# Patient Record
Sex: Female | Born: 1950 | Race: White | Hispanic: No | Smoking: Never smoker
Health system: Southern US, Community
[De-identification: ages and names within clinical notes are randomized; demographics above are authoritative.]

## PROBLEM LIST (undated history)

## (undated) DIAGNOSIS — G43909 Migraine, unspecified, not intractable, without status migrainosus: Secondary | ICD-10-CM

## (undated) DIAGNOSIS — M797 Fibromyalgia: Secondary | ICD-10-CM

## (undated) DIAGNOSIS — T7840XA Allergy, unspecified, initial encounter: Secondary | ICD-10-CM

## (undated) DIAGNOSIS — E119 Type 2 diabetes mellitus without complications: Secondary | ICD-10-CM

## (undated) DIAGNOSIS — M1990 Unspecified osteoarthritis, unspecified site: Secondary | ICD-10-CM

## (undated) DIAGNOSIS — I10 Essential (primary) hypertension: Secondary | ICD-10-CM

## (undated) DIAGNOSIS — R112 Nausea with vomiting, unspecified: Secondary | ICD-10-CM

## (undated) DIAGNOSIS — R011 Cardiac murmur, unspecified: Secondary | ICD-10-CM

## (undated) DIAGNOSIS — F32A Depression, unspecified: Secondary | ICD-10-CM

## (undated) DIAGNOSIS — E1149 Type 2 diabetes mellitus with other diabetic neurological complication: Secondary | ICD-10-CM

## (undated) DIAGNOSIS — Z9981 Dependence on supplemental oxygen: Secondary | ICD-10-CM

## (undated) DIAGNOSIS — D649 Anemia, unspecified: Secondary | ICD-10-CM

## (undated) DIAGNOSIS — E039 Hypothyroidism, unspecified: Secondary | ICD-10-CM

## (undated) DIAGNOSIS — Z8679 Personal history of other diseases of the circulatory system: Secondary | ICD-10-CM

## (undated) DIAGNOSIS — I350 Nonrheumatic aortic (valve) stenosis: Secondary | ICD-10-CM

## (undated) DIAGNOSIS — I493 Ventricular premature depolarization: Secondary | ICD-10-CM

## (undated) DIAGNOSIS — R61 Generalized hyperhidrosis: Secondary | ICD-10-CM

## (undated) DIAGNOSIS — Z9889 Other specified postprocedural states: Secondary | ICD-10-CM

## (undated) DIAGNOSIS — E538 Deficiency of other specified B group vitamins: Secondary | ICD-10-CM

## (undated) DIAGNOSIS — E785 Hyperlipidemia, unspecified: Secondary | ICD-10-CM

## (undated) DIAGNOSIS — T8859XA Other complications of anesthesia, initial encounter: Secondary | ICD-10-CM

## (undated) DIAGNOSIS — K589 Irritable bowel syndrome without diarrhea: Secondary | ICD-10-CM

## (undated) DIAGNOSIS — K219 Gastro-esophageal reflux disease without esophagitis: Secondary | ICD-10-CM

## (undated) DIAGNOSIS — F329 Major depressive disorder, single episode, unspecified: Secondary | ICD-10-CM

## (undated) DIAGNOSIS — R0902 Hypoxemia: Secondary | ICD-10-CM

## (undated) DIAGNOSIS — T4145XA Adverse effect of unspecified anesthetic, initial encounter: Secondary | ICD-10-CM

## (undated) HISTORY — DX: Depression, unspecified: F32.A

## (undated) HISTORY — DX: Fibromyalgia: M79.7

## (undated) HISTORY — DX: Hypoxemia: R09.02

## (undated) HISTORY — PX: ADENOIDECTOMY: SUR15

## (undated) HISTORY — PX: BACK SURGERY: SHX140

## (undated) HISTORY — DX: Gastro-esophageal reflux disease without esophagitis: K21.9

## (undated) HISTORY — DX: Type 2 diabetes mellitus with other diabetic neurological complication: E11.49

## (undated) HISTORY — DX: Deficiency of other specified B group vitamins: E53.8

## (undated) HISTORY — DX: Migraine, unspecified, not intractable, without status migrainosus: G43.909

## (undated) HISTORY — DX: Personal history of other diseases of the circulatory system: Z86.79

## (undated) HISTORY — DX: Hyperlipidemia, unspecified: E78.5

## (undated) HISTORY — DX: Type 2 diabetes mellitus without complications: E11.9

## (undated) HISTORY — DX: Irritable bowel syndrome, unspecified: K58.9

## (undated) HISTORY — DX: Allergy, unspecified, initial encounter: T78.40XA

## (undated) HISTORY — DX: Major depressive disorder, single episode, unspecified: F32.9

## (undated) HISTORY — PX: TONSILLECTOMY AND ADENOIDECTOMY: SUR1326

## (undated) HISTORY — DX: Anemia, unspecified: D64.9

## (undated) HISTORY — DX: Hypothyroidism, unspecified: E03.9

## (undated) HISTORY — DX: Generalized hyperhidrosis: R61

## (undated) HISTORY — PX: ANTERIOR CERVICAL DECOMP/DISCECTOMY FUSION: SHX1161

## (undated) HISTORY — DX: Nonrheumatic aortic (valve) stenosis: I35.0

## (undated) HISTORY — DX: Essential (primary) hypertension: I10

---

## 1977-06-27 HISTORY — PX: TUBAL LIGATION: SHX77

## 1986-06-27 HISTORY — PX: ABDOMINAL HYSTERECTOMY: SHX81

## 1986-06-27 HISTORY — PX: APPENDECTOMY: SHX54

## 1997-10-28 ENCOUNTER — Ambulatory Visit (HOSPITAL_COMMUNITY): Admission: RE | Admit: 1997-10-28 | Discharge: 1997-10-28 | Payer: Self-pay | Admitting: Neurosurgery

## 1999-07-23 ENCOUNTER — Encounter: Payer: Self-pay | Admitting: Family Medicine

## 1999-07-23 ENCOUNTER — Encounter: Admission: RE | Admit: 1999-07-23 | Discharge: 1999-07-23 | Payer: Self-pay | Admitting: Family Medicine

## 1999-08-02 ENCOUNTER — Encounter: Admission: RE | Admit: 1999-08-02 | Discharge: 1999-08-02 | Payer: Self-pay | Admitting: Family Medicine

## 1999-08-02 ENCOUNTER — Encounter: Payer: Self-pay | Admitting: Family Medicine

## 2000-02-18 ENCOUNTER — Encounter: Admission: RE | Admit: 2000-02-18 | Discharge: 2000-02-18 | Payer: Self-pay | Admitting: Family Medicine

## 2000-02-18 ENCOUNTER — Encounter: Payer: Self-pay | Admitting: Family Medicine

## 2001-05-02 ENCOUNTER — Encounter: Payer: Self-pay | Admitting: *Deleted

## 2001-05-02 ENCOUNTER — Emergency Department (HOSPITAL_COMMUNITY): Admission: EM | Admit: 2001-05-02 | Discharge: 2001-05-02 | Payer: Self-pay | Admitting: *Deleted

## 2001-05-21 ENCOUNTER — Encounter: Admission: RE | Admit: 2001-05-21 | Discharge: 2001-05-21 | Payer: Self-pay | Admitting: Gastroenterology

## 2001-05-21 ENCOUNTER — Encounter: Payer: Self-pay | Admitting: Gastroenterology

## 2001-06-26 ENCOUNTER — Encounter (INDEPENDENT_AMBULATORY_CARE_PROVIDER_SITE_OTHER): Payer: Self-pay | Admitting: Specialist

## 2001-06-26 ENCOUNTER — Observation Stay (HOSPITAL_COMMUNITY): Admission: RE | Admit: 2001-06-26 | Discharge: 2001-06-27 | Payer: Self-pay | Admitting: *Deleted

## 2001-11-07 ENCOUNTER — Encounter: Admission: RE | Admit: 2001-11-07 | Discharge: 2001-11-07 | Payer: Self-pay | Admitting: Internal Medicine

## 2001-11-07 ENCOUNTER — Encounter: Payer: Self-pay | Admitting: Internal Medicine

## 2002-10-08 ENCOUNTER — Encounter: Payer: Self-pay | Admitting: Internal Medicine

## 2002-10-08 ENCOUNTER — Encounter: Admission: RE | Admit: 2002-10-08 | Discharge: 2002-10-08 | Payer: Self-pay | Admitting: Internal Medicine

## 2003-06-28 HISTORY — PX: CHOLECYSTECTOMY: SHX55

## 2003-10-22 ENCOUNTER — Encounter: Admission: RE | Admit: 2003-10-22 | Discharge: 2003-10-22 | Payer: Self-pay | Admitting: Internal Medicine

## 2004-06-27 HISTORY — PX: NASAL SINUS SURGERY: SHX719

## 2005-01-25 ENCOUNTER — Encounter: Admission: RE | Admit: 2005-01-25 | Discharge: 2005-01-25 | Payer: Self-pay | Admitting: Internal Medicine

## 2005-02-27 ENCOUNTER — Emergency Department (HOSPITAL_COMMUNITY): Admission: EM | Admit: 2005-02-27 | Discharge: 2005-02-28 | Payer: Self-pay | Admitting: Emergency Medicine

## 2005-03-08 ENCOUNTER — Encounter: Admission: RE | Admit: 2005-03-08 | Discharge: 2005-03-08 | Payer: Self-pay | Admitting: Internal Medicine

## 2005-12-13 ENCOUNTER — Emergency Department (HOSPITAL_COMMUNITY): Admission: EM | Admit: 2005-12-13 | Discharge: 2005-12-13 | Payer: Self-pay | Admitting: Emergency Medicine

## 2005-12-22 ENCOUNTER — Encounter: Admission: RE | Admit: 2005-12-22 | Discharge: 2005-12-22 | Payer: Self-pay | Admitting: Neurosurgery

## 2006-01-31 ENCOUNTER — Encounter: Admission: RE | Admit: 2006-01-31 | Discharge: 2006-01-31 | Payer: Self-pay | Admitting: Neurosurgery

## 2006-02-22 ENCOUNTER — Encounter: Admission: RE | Admit: 2006-02-22 | Discharge: 2006-02-22 | Payer: Self-pay | Admitting: Neurosurgery

## 2006-03-27 ENCOUNTER — Encounter: Admission: RE | Admit: 2006-03-27 | Discharge: 2006-03-27 | Payer: Self-pay | Admitting: Neurosurgery

## 2006-09-08 ENCOUNTER — Ambulatory Visit: Payer: Self-pay | Admitting: Internal Medicine

## 2006-09-08 LAB — CONVERTED CEMR LAB
BUN: 13 mg/dL (ref 6–23)
Creatinine, Ser: 0.78 mg/dL (ref 0.40–1.20)
Potassium: 3.3 meq/L — ABNORMAL LOW (ref 3.5–5.3)
TSH: 3.027 microintl units/mL (ref 0.350–5.50)

## 2006-10-02 ENCOUNTER — Ambulatory Visit: Payer: Self-pay | Admitting: Internal Medicine

## 2006-10-02 LAB — CONVERTED CEMR LAB
ALT: 34 units/L (ref 0–40)
AST: 32 units/L (ref 0–37)
Albumin: 4.1 g/dL (ref 3.5–5.2)
Alkaline Phosphatase: 91 units/L (ref 39–117)
Amylase: 98 units/L (ref 27–131)
BUN: 9 mg/dL (ref 6–23)
Basophils Absolute: 0.3 10*3/uL — ABNORMAL HIGH (ref 0.0–0.1)
Basophils Relative: 3.6 % — ABNORMAL HIGH (ref 0.0–1.0)
Bilirubin, Direct: 0.1 mg/dL (ref 0.0–0.3)
CO2: 28 meq/L (ref 19–32)
Calcium: 9.6 mg/dL (ref 8.4–10.5)
Chloride: 106 meq/L (ref 96–112)
Creatinine, Ser: 0.8 mg/dL (ref 0.4–1.2)
Eosinophils Absolute: 0 10*3/uL (ref 0.0–0.6)
Eosinophils Relative: 0.2 % (ref 0.0–5.0)
GFR calc Af Amer: 96 mL/min
GFR calc non Af Amer: 79 mL/min
Glucose, Bld: 99 mg/dL (ref 70–99)
HCT: 39.3 % (ref 36.0–46.0)
Hemoglobin: 13.7 g/dL (ref 12.0–15.0)
Lipase: 23 units/L (ref 11.0–59.0)
Lymphocytes Relative: 15.7 % (ref 12.0–46.0)
MCHC: 34.8 g/dL (ref 30.0–36.0)
MCV: 91.3 fL (ref 78.0–100.0)
Monocytes Absolute: 0.1 10*3/uL — ABNORMAL LOW (ref 0.2–0.7)
Monocytes Relative: 1.7 % — ABNORMAL LOW (ref 3.0–11.0)
Neutro Abs: 6.3 10*3/uL (ref 1.4–7.7)
Neutrophils Relative %: 78.8 % — ABNORMAL HIGH (ref 43.0–77.0)
Platelets: 398 10*3/uL (ref 150–400)
Potassium: 4.2 meq/L (ref 3.5–5.1)
RBC: 4.3 M/uL (ref 3.87–5.11)
RDW: 12.7 % (ref 11.5–14.6)
Sodium: 143 meq/L (ref 135–145)
Total Bilirubin: 0.7 mg/dL (ref 0.3–1.2)
Total Protein: 7.2 g/dL (ref 6.0–8.3)
WBC: 7.9 10*3/uL (ref 4.5–10.5)

## 2006-10-03 ENCOUNTER — Encounter: Payer: Self-pay | Admitting: Internal Medicine

## 2006-10-17 ENCOUNTER — Ambulatory Visit: Payer: Self-pay | Admitting: Gastroenterology

## 2006-10-20 ENCOUNTER — Ambulatory Visit: Payer: Self-pay | Admitting: Gastroenterology

## 2006-10-20 ENCOUNTER — Encounter: Payer: Self-pay | Admitting: Gastroenterology

## 2006-10-20 LAB — CONVERTED CEMR LAB
Free T4: 1 ng/dL (ref 0.6–1.6)
TSH: 1.17 microintl units/mL (ref 0.35–5.50)

## 2006-10-24 ENCOUNTER — Ambulatory Visit (HOSPITAL_COMMUNITY): Admission: RE | Admit: 2006-10-24 | Discharge: 2006-10-24 | Payer: Self-pay | Admitting: Gastroenterology

## 2006-10-31 ENCOUNTER — Ambulatory Visit: Payer: Self-pay | Admitting: Gastroenterology

## 2006-11-07 ENCOUNTER — Encounter: Payer: Self-pay | Admitting: Internal Medicine

## 2006-11-07 ENCOUNTER — Ambulatory Visit: Payer: Self-pay | Admitting: Internal Medicine

## 2006-11-13 DIAGNOSIS — K219 Gastro-esophageal reflux disease without esophagitis: Secondary | ICD-10-CM | POA: Insufficient documentation

## 2006-11-13 DIAGNOSIS — I10 Essential (primary) hypertension: Secondary | ICD-10-CM | POA: Insufficient documentation

## 2006-11-13 DIAGNOSIS — E538 Deficiency of other specified B group vitamins: Secondary | ICD-10-CM | POA: Insufficient documentation

## 2006-11-27 ENCOUNTER — Encounter: Admission: RE | Admit: 2006-11-27 | Discharge: 2006-11-27 | Payer: Self-pay | Admitting: Internal Medicine

## 2006-12-07 ENCOUNTER — Encounter: Admission: RE | Admit: 2006-12-07 | Discharge: 2006-12-07 | Payer: Self-pay | Admitting: Internal Medicine

## 2006-12-18 ENCOUNTER — Ambulatory Visit: Payer: Self-pay | Admitting: Gastroenterology

## 2006-12-28 ENCOUNTER — Ambulatory Visit: Payer: Self-pay | Admitting: Gastroenterology

## 2007-01-04 ENCOUNTER — Ambulatory Visit: Payer: Self-pay | Admitting: Gastroenterology

## 2007-01-11 ENCOUNTER — Ambulatory Visit: Payer: Self-pay | Admitting: Internal Medicine

## 2007-01-11 DIAGNOSIS — E559 Vitamin D deficiency, unspecified: Secondary | ICD-10-CM | POA: Insufficient documentation

## 2007-01-11 DIAGNOSIS — IMO0001 Reserved for inherently not codable concepts without codable children: Secondary | ICD-10-CM | POA: Insufficient documentation

## 2007-01-11 LAB — CONVERTED CEMR LAB
ALT: 20 units/L (ref 0–35)
AST: 22 units/L (ref 0–37)
Albumin: 4 g/dL (ref 3.5–5.2)
Alkaline Phosphatase: 99 units/L (ref 39–117)
BUN: 11 mg/dL (ref 6–23)
Basophils Absolute: 0 10*3/uL (ref 0.0–0.1)
Basophils Relative: 0.7 % (ref 0.0–1.0)
Bilirubin, Direct: 0.1 mg/dL (ref 0.0–0.3)
CO2: 32 meq/L (ref 19–32)
Calcium: 10.2 mg/dL (ref 8.4–10.5)
Chloride: 105 meq/L (ref 96–112)
Cholesterol: 221 mg/dL (ref 0–200)
Creatinine, Ser: 0.7 mg/dL (ref 0.4–1.2)
Direct LDL: 170 mg/dL
Eosinophils Absolute: 0.1 10*3/uL (ref 0.0–0.6)
Eosinophils Relative: 1.3 % (ref 0.0–5.0)
GFR calc Af Amer: 112 mL/min
GFR calc non Af Amer: 92 mL/min
Glucose, Bld: 106 mg/dL — ABNORMAL HIGH (ref 70–99)
HCT: 39 % (ref 36.0–46.0)
HDL: 44.6 mg/dL (ref >39.0)
Hemoglobin: 13.6 g/dL (ref 12.0–15.0)
Lymphocytes Relative: 21.4 % (ref 12.0–46.0)
MCHC: 35 g/dL (ref 30.0–36.0)
MCV: 91.1 fL (ref 78.0–100.0)
Monocytes Absolute: 0.3 10*3/uL (ref 0.2–0.7)
Monocytes Relative: 5.2 % (ref 3.0–11.0)
Neutro Abs: 4.9 10*3/uL (ref 1.4–7.7)
Neutrophils Relative %: 71.4 % (ref 43.0–77.0)
Platelets: 345 10*3/uL (ref 150–400)
Potassium: 4.2 meq/L (ref 3.5–5.1)
RBC: 4.28 M/uL (ref 3.87–5.11)
RDW: 11.4 % — ABNORMAL LOW (ref 11.5–14.6)
Sodium: 143 meq/L (ref 135–145)
TSH: 2.68 microintl units/mL (ref 0.35–5.50)
Total Bilirubin: 0.8 mg/dL (ref 0.3–1.2)
Total CHOL/HDL Ratio: 5
Total Protein: 7 g/dL (ref 6.0–8.3)
Triglycerides: 159 mg/dL — ABNORMAL HIGH (ref 0–149)
VLDL: 32 mg/dL (ref 0–40)
Vitamin B-12: >1500 pg/mL — ABNORMAL HIGH (ref 211–911)
WBC: 6.7 10*3/uL (ref 4.5–10.5)

## 2007-01-16 ENCOUNTER — Telehealth (INDEPENDENT_AMBULATORY_CARE_PROVIDER_SITE_OTHER): Payer: Self-pay | Admitting: *Deleted

## 2007-01-17 ENCOUNTER — Encounter (INDEPENDENT_AMBULATORY_CARE_PROVIDER_SITE_OTHER): Payer: Self-pay | Admitting: *Deleted

## 2007-02-05 ENCOUNTER — Ambulatory Visit: Payer: Self-pay | Admitting: Gastroenterology

## 2007-04-29 ENCOUNTER — Encounter: Admission: RE | Admit: 2007-04-29 | Discharge: 2007-04-29 | Payer: Self-pay | Admitting: Family Medicine

## 2007-04-30 ENCOUNTER — Encounter: Admission: RE | Admit: 2007-04-30 | Discharge: 2007-04-30 | Payer: Self-pay | Admitting: Family Medicine

## 2007-05-10 ENCOUNTER — Ambulatory Visit: Payer: Self-pay | Admitting: Internal Medicine

## 2007-05-16 ENCOUNTER — Encounter (INDEPENDENT_AMBULATORY_CARE_PROVIDER_SITE_OTHER): Payer: Self-pay | Admitting: *Deleted

## 2007-05-16 LAB — CONVERTED CEMR LAB
Hgb A1c MFr Bld: 5.7 % (ref 4.6–6.0)
Vitamin B-12: 400 pg/mL (ref 211–911)

## 2007-05-28 ENCOUNTER — Encounter (INDEPENDENT_AMBULATORY_CARE_PROVIDER_SITE_OTHER): Payer: Self-pay | Admitting: *Deleted

## 2007-05-29 ENCOUNTER — Ambulatory Visit: Payer: Self-pay | Admitting: Internal Medicine

## 2007-05-29 DIAGNOSIS — E782 Mixed hyperlipidemia: Secondary | ICD-10-CM | POA: Insufficient documentation

## 2007-05-29 DIAGNOSIS — E039 Hypothyroidism, unspecified: Secondary | ICD-10-CM | POA: Insufficient documentation

## 2007-05-29 DIAGNOSIS — E785 Hyperlipidemia, unspecified: Secondary | ICD-10-CM | POA: Insufficient documentation

## 2007-05-29 HISTORY — DX: Hypothyroidism, unspecified: E03.9

## 2007-05-29 LAB — CONVERTED CEMR LAB
Cholesterol, target level: 200 mg/dL
HDL goal, serum: 40 mg/dL
LDL Goal: 130 mg/dL

## 2007-06-06 ENCOUNTER — Encounter: Admission: RE | Admit: 2007-06-06 | Discharge: 2007-06-06 | Payer: Self-pay | Admitting: Orthopedic Surgery

## 2007-06-28 HISTORY — PX: TRANSTHORACIC ECHOCARDIOGRAM: SHX275

## 2007-07-24 ENCOUNTER — Ambulatory Visit (HOSPITAL_COMMUNITY): Admission: RE | Admit: 2007-07-24 | Discharge: 2007-07-25 | Payer: Self-pay | Admitting: Neurosurgery

## 2007-08-09 ENCOUNTER — Ambulatory Visit: Payer: Self-pay | Admitting: Internal Medicine

## 2007-08-18 LAB — CONVERTED CEMR LAB
ALT: 19 units/L (ref 0–35)
AST: 18 units/L (ref 0–37)
Albumin: 3.8 g/dL (ref 3.5–5.2)
Alkaline Phosphatase: 93 units/L (ref 39–117)
Bilirubin, Direct: 0.1 mg/dL (ref 0.0–0.3)
Cholesterol: 179 mg/dL (ref 0–200)
Direct LDL: 106.5 mg/dL
HDL: 45.8 mg/dL (ref >39.0)
Total Bilirubin: 0.8 mg/dL (ref 0.3–1.2)
Total CHOL/HDL Ratio: 3.9
Total CK: 56 units/L (ref 7–177)
Total Protein: 7.1 g/dL (ref 6.0–8.3)
Triglycerides: 204 mg/dL (ref 0–149)
VLDL: 41 mg/dL — ABNORMAL HIGH (ref 0–40)

## 2007-08-20 ENCOUNTER — Encounter (INDEPENDENT_AMBULATORY_CARE_PROVIDER_SITE_OTHER): Payer: Self-pay | Admitting: *Deleted

## 2007-08-20 ENCOUNTER — Telehealth (INDEPENDENT_AMBULATORY_CARE_PROVIDER_SITE_OTHER): Payer: Self-pay | Admitting: *Deleted

## 2007-08-20 LAB — CONVERTED CEMR LAB: Vit D, 1,25-Dihydroxy: 31 (ref 30–89)

## 2007-08-21 ENCOUNTER — Ambulatory Visit: Payer: Self-pay | Admitting: Internal Medicine

## 2007-09-17 ENCOUNTER — Encounter: Admission: RE | Admit: 2007-09-17 | Discharge: 2007-09-17 | Payer: Self-pay | Admitting: Family Medicine

## 2007-09-29 DIAGNOSIS — R197 Diarrhea, unspecified: Secondary | ICD-10-CM | POA: Insufficient documentation

## 2007-10-12 ENCOUNTER — Ambulatory Visit: Payer: Self-pay | Admitting: Internal Medicine

## 2007-10-12 ENCOUNTER — Telehealth (INDEPENDENT_AMBULATORY_CARE_PROVIDER_SITE_OTHER): Payer: Self-pay | Admitting: *Deleted

## 2007-12-04 ENCOUNTER — Ambulatory Visit: Payer: Self-pay | Admitting: Family Medicine

## 2007-12-10 ENCOUNTER — Ambulatory Visit: Payer: Self-pay | Admitting: Internal Medicine

## 2007-12-10 LAB — CONVERTED CEMR LAB: Vit D, 1,25-Dihydroxy: 35 (ref 30–89)

## 2007-12-11 LAB — CONVERTED CEMR LAB
Hgb A1c MFr Bld: 5.7 % (ref 4.6–6.0)
Potassium: 4 meq/L (ref 3.5–5.1)

## 2007-12-12 ENCOUNTER — Encounter: Admission: RE | Admit: 2007-12-12 | Discharge: 2007-12-12 | Payer: Self-pay | Admitting: Internal Medicine

## 2007-12-18 ENCOUNTER — Ambulatory Visit: Payer: Self-pay | Admitting: Internal Medicine

## 2007-12-18 DIAGNOSIS — R011 Cardiac murmur, unspecified: Secondary | ICD-10-CM | POA: Insufficient documentation

## 2007-12-19 ENCOUNTER — Encounter (INDEPENDENT_AMBULATORY_CARE_PROVIDER_SITE_OTHER): Payer: Self-pay | Admitting: *Deleted

## 2007-12-25 ENCOUNTER — Encounter: Payer: Self-pay | Admitting: Internal Medicine

## 2007-12-25 ENCOUNTER — Ambulatory Visit: Payer: Self-pay

## 2007-12-27 ENCOUNTER — Encounter: Payer: Self-pay | Admitting: Internal Medicine

## 2007-12-31 ENCOUNTER — Encounter: Payer: Self-pay | Admitting: Internal Medicine

## 2007-12-31 ENCOUNTER — Ambulatory Visit: Payer: Self-pay | Admitting: Family Medicine

## 2008-01-02 ENCOUNTER — Encounter (INDEPENDENT_AMBULATORY_CARE_PROVIDER_SITE_OTHER): Payer: Self-pay | Admitting: *Deleted

## 2008-02-28 ENCOUNTER — Encounter: Payer: Self-pay | Admitting: Internal Medicine

## 2008-03-18 ENCOUNTER — Telehealth (INDEPENDENT_AMBULATORY_CARE_PROVIDER_SITE_OTHER): Payer: Self-pay | Admitting: *Deleted

## 2008-03-19 ENCOUNTER — Encounter: Admission: RE | Admit: 2008-03-19 | Discharge: 2008-03-19 | Payer: Self-pay | Admitting: Orthopaedic Surgery

## 2008-04-07 ENCOUNTER — Encounter
Admission: RE | Admit: 2008-04-07 | Discharge: 2008-07-06 | Payer: Self-pay | Admitting: Physical Medicine & Rehabilitation

## 2008-04-08 ENCOUNTER — Ambulatory Visit: Payer: Self-pay | Admitting: Physical Medicine & Rehabilitation

## 2008-04-22 ENCOUNTER — Ambulatory Visit: Payer: Self-pay | Admitting: Physical Medicine & Rehabilitation

## 2008-04-28 ENCOUNTER — Encounter: Admission: RE | Admit: 2008-04-28 | Discharge: 2008-07-27 | Payer: Self-pay | Admitting: Anesthesiology

## 2008-04-29 ENCOUNTER — Ambulatory Visit: Payer: Self-pay | Admitting: Anesthesiology

## 2008-05-20 ENCOUNTER — Ambulatory Visit: Payer: Self-pay | Admitting: Physical Medicine & Rehabilitation

## 2008-05-27 ENCOUNTER — Ambulatory Visit: Payer: Self-pay | Admitting: Anesthesiology

## 2008-06-17 ENCOUNTER — Ambulatory Visit: Payer: Self-pay | Admitting: Physical Medicine & Rehabilitation

## 2008-06-18 ENCOUNTER — Ambulatory Visit: Payer: Self-pay | Admitting: Internal Medicine

## 2008-06-18 LAB — CONVERTED CEMR LAB
ALT: 30 units/L (ref 0–35)
AST: 25 units/L (ref 0–37)
Albumin: 3.4 g/dL — ABNORMAL LOW (ref 3.5–5.2)
Alkaline Phosphatase: 111 units/L (ref 39–117)
Bilirubin, Direct: 0.1 mg/dL (ref 0.0–0.3)
Cholesterol: 212 mg/dL (ref 0–200)
Direct LDL: 145.2 mg/dL
HDL: 55.8 mg/dL (ref >39.0)
Total Bilirubin: 0.7 mg/dL (ref 0.3–1.2)
Total CHOL/HDL Ratio: 3.8
Total Protein: 6.8 g/dL (ref 6.0–8.3)
Triglycerides: 102 mg/dL (ref 0–149)
VLDL: 20 mg/dL (ref 0–40)

## 2008-06-24 ENCOUNTER — Ambulatory Visit: Payer: Self-pay | Admitting: Internal Medicine

## 2008-06-24 LAB — CONVERTED CEMR LAB
Cholesterol, target level: 200 mg/dL
HDL goal, serum: 50 mg/dL
LDL Goal: 100 mg/dL

## 2008-07-09 ENCOUNTER — Encounter
Admission: RE | Admit: 2008-07-09 | Discharge: 2008-10-07 | Payer: Self-pay | Admitting: Physical Medicine & Rehabilitation

## 2008-07-10 ENCOUNTER — Ambulatory Visit: Payer: Self-pay | Admitting: Physical Medicine & Rehabilitation

## 2008-08-11 ENCOUNTER — Ambulatory Visit: Payer: Self-pay | Admitting: Physical Medicine & Rehabilitation

## 2008-09-09 ENCOUNTER — Ambulatory Visit: Payer: Self-pay | Admitting: Physical Medicine & Rehabilitation

## 2008-09-22 ENCOUNTER — Ambulatory Visit: Payer: Self-pay | Admitting: Family Medicine

## 2008-09-22 DIAGNOSIS — J309 Allergic rhinitis, unspecified: Secondary | ICD-10-CM | POA: Insufficient documentation

## 2008-09-22 DIAGNOSIS — F3341 Major depressive disorder, recurrent, in partial remission: Secondary | ICD-10-CM | POA: Insufficient documentation

## 2008-11-03 ENCOUNTER — Encounter
Admission: RE | Admit: 2008-11-03 | Discharge: 2008-12-11 | Payer: Self-pay | Admitting: Physical Medicine & Rehabilitation

## 2008-11-04 ENCOUNTER — Ambulatory Visit: Payer: Self-pay | Admitting: Physical Medicine & Rehabilitation

## 2008-11-18 ENCOUNTER — Ambulatory Visit: Payer: Self-pay | Admitting: Family Medicine

## 2008-11-25 ENCOUNTER — Ambulatory Visit: Payer: Self-pay | Admitting: Professional

## 2008-12-02 ENCOUNTER — Ambulatory Visit: Payer: Self-pay | Admitting: Professional

## 2008-12-09 ENCOUNTER — Ambulatory Visit: Payer: Self-pay | Admitting: Professional

## 2008-12-10 ENCOUNTER — Encounter: Payer: Self-pay | Admitting: Family Medicine

## 2008-12-11 ENCOUNTER — Ambulatory Visit: Payer: Self-pay | Admitting: Physical Medicine & Rehabilitation

## 2008-12-12 ENCOUNTER — Encounter: Admission: RE | Admit: 2008-12-12 | Discharge: 2008-12-12 | Payer: Self-pay | Admitting: Family Medicine

## 2008-12-15 ENCOUNTER — Encounter (INDEPENDENT_AMBULATORY_CARE_PROVIDER_SITE_OTHER): Payer: Self-pay | Admitting: *Deleted

## 2008-12-16 ENCOUNTER — Ambulatory Visit: Payer: Self-pay | Admitting: Professional

## 2008-12-23 ENCOUNTER — Ambulatory Visit: Payer: Self-pay | Admitting: Family Medicine

## 2008-12-24 DIAGNOSIS — R740 Nonspecific elevation of levels of transaminase and lactic acid dehydrogenase [LDH]: Secondary | ICD-10-CM

## 2008-12-24 DIAGNOSIS — R7401 Elevation of levels of liver transaminase levels: Secondary | ICD-10-CM | POA: Insufficient documentation

## 2008-12-24 LAB — CONVERTED CEMR LAB
ALT: 47 units/L — ABNORMAL HIGH (ref 0–35)
AST: 50 units/L — ABNORMAL HIGH (ref 0–37)
Albumin: 3.7 g/dL (ref 3.5–5.2)
Alkaline Phosphatase: 79 units/L (ref 39–117)
BUN: 14 mg/dL (ref 6–23)
Basophils Absolute: 0 10*3/uL (ref 0.0–0.1)
Basophils Relative: 0.6 % (ref 0.0–3.0)
Bilirubin, Direct: 0.1 mg/dL (ref 0.0–0.3)
CO2: 27 meq/L (ref 19–32)
Calcium: 9.4 mg/dL (ref 8.4–10.5)
Chloride: 104 meq/L (ref 96–112)
Creatinine, Ser: 0.8 mg/dL (ref 0.4–1.2)
Eosinophils Absolute: 0.1 10*3/uL (ref 0.0–0.7)
Eosinophils Relative: 2.1 % (ref 0.0–5.0)
GFR calc non Af Amer: 78.54 mL/min (ref >60)
Glucose, Bld: 182 mg/dL — ABNORMAL HIGH (ref 70–99)
HCT: 36.9 % (ref 36.0–46.0)
Hemoglobin: 12.7 g/dL (ref 12.0–15.0)
Lymphocytes Relative: 25.2 % (ref 12.0–46.0)
Lymphs Abs: 1.2 10*3/uL (ref 0.7–4.0)
MCHC: 34.4 g/dL (ref 30.0–36.0)
MCV: 93.1 fL (ref 78.0–100.0)
Monocytes Absolute: 0.2 10*3/uL (ref 0.1–1.0)
Monocytes Relative: 4.4 % (ref 3.0–12.0)
Neutro Abs: 3.4 10*3/uL (ref 1.4–7.7)
Neutrophils Relative %: 67.7 % (ref 43.0–77.0)
Platelets: 235 10*3/uL (ref 150.0–400.0)
Potassium: 3.8 meq/L (ref 3.5–5.1)
RBC: 3.97 M/uL (ref 3.87–5.11)
RDW: 12.2 % (ref 11.5–14.6)
Sodium: 142 meq/L (ref 135–145)
TSH: 2.51 microintl units/mL (ref 0.35–5.50)
Total Bilirubin: 0.7 mg/dL (ref 0.3–1.2)
Total Protein: 6.8 g/dL (ref 6.0–8.3)
Vitamin B-12: 268 pg/mL (ref 211–911)
WBC: 4.9 10*3/uL (ref 4.5–10.5)

## 2008-12-25 LAB — CONVERTED CEMR LAB: Vit D, 25-Hydroxy: 26 ng/mL — ABNORMAL LOW (ref 30–89)

## 2009-01-15 ENCOUNTER — Ambulatory Visit: Payer: Self-pay | Admitting: Family Medicine

## 2009-01-15 LAB — CONVERTED CEMR LAB
ALT: 52 units/L — ABNORMAL HIGH (ref 0–35)
AST: 50 units/L — ABNORMAL HIGH (ref 0–37)
Albumin: 3.9 g/dL (ref 3.5–5.2)
Alkaline Phosphatase: 96 units/L (ref 39–117)
BUN: 17 mg/dL (ref 6–23)
Bilirubin, Direct: 0.2 mg/dL (ref 0.0–0.3)
CO2: 30 meq/L (ref 19–32)
Calcium: 9.3 mg/dL (ref 8.4–10.5)
Chloride: 106 meq/L (ref 96–112)
Cholesterol: 224 mg/dL — ABNORMAL HIGH (ref 0–200)
Creatinine, Ser: 0.9 mg/dL (ref 0.4–1.2)
Direct LDL: 158.2 mg/dL
GFR calc non Af Amer: 68.54 mL/min (ref >60)
Glucose, Bld: 108 mg/dL — ABNORMAL HIGH (ref 70–99)
HDL: 39 mg/dL — ABNORMAL LOW (ref >39.00)
Hgb A1c MFr Bld: 6 % (ref 4.6–6.5)
Potassium: 3.9 meq/L (ref 3.5–5.1)
Sodium: 143 meq/L (ref 135–145)
Total Bilirubin: 1.1 mg/dL (ref 0.3–1.2)
Total CHOL/HDL Ratio: 6
Total Protein: 7.2 g/dL (ref 6.0–8.3)
Triglycerides: 230 mg/dL — ABNORMAL HIGH (ref 0.0–149.0)
VLDL: 46 mg/dL — ABNORMAL HIGH (ref 0.0–40.0)

## 2009-01-19 ENCOUNTER — Encounter: Admission: RE | Admit: 2009-01-19 | Discharge: 2009-04-19 | Payer: Self-pay | Admitting: Anesthesiology

## 2009-01-20 ENCOUNTER — Ambulatory Visit: Payer: Self-pay | Admitting: Anesthesiology

## 2009-01-22 ENCOUNTER — Ambulatory Visit: Payer: Self-pay | Admitting: Family Medicine

## 2009-01-23 LAB — CONVERTED CEMR LAB
HCV Ab: NEGATIVE
Hep A IgM: NEGATIVE
Hep B C IgM: NEGATIVE
Hepatitis B Surface Ag: NEGATIVE

## 2009-02-03 ENCOUNTER — Encounter: Admission: RE | Admit: 2009-02-03 | Discharge: 2009-02-03 | Payer: Self-pay | Admitting: Family Medicine

## 2009-02-03 ENCOUNTER — Encounter: Payer: Self-pay | Admitting: Family Medicine

## 2009-02-10 ENCOUNTER — Encounter
Admission: RE | Admit: 2009-02-10 | Discharge: 2009-05-11 | Payer: Self-pay | Admitting: Physical Medicine & Rehabilitation

## 2009-02-16 ENCOUNTER — Ambulatory Visit: Payer: Self-pay | Admitting: Physical Medicine & Rehabilitation

## 2009-03-16 ENCOUNTER — Ambulatory Visit: Payer: Self-pay | Admitting: Family Medicine

## 2009-03-17 ENCOUNTER — Ambulatory Visit: Payer: Self-pay | Admitting: Physical Medicine & Rehabilitation

## 2009-03-17 LAB — CONVERTED CEMR LAB: Vit D, 25-Hydroxy: 33 ng/mL (ref 30–89)

## 2009-04-10 ENCOUNTER — Ambulatory Visit: Payer: Self-pay | Admitting: Physical Medicine & Rehabilitation

## 2009-04-13 ENCOUNTER — Encounter: Admission: RE | Admit: 2009-04-13 | Discharge: 2009-06-18 | Payer: Self-pay | Admitting: Anesthesiology

## 2009-04-14 ENCOUNTER — Ambulatory Visit: Payer: Self-pay | Admitting: Anesthesiology

## 2009-04-16 ENCOUNTER — Telehealth: Payer: Self-pay | Admitting: Family Medicine

## 2009-05-04 ENCOUNTER — Ambulatory Visit: Payer: Self-pay | Admitting: Family Medicine

## 2009-05-04 DIAGNOSIS — R5381 Other malaise: Secondary | ICD-10-CM | POA: Insufficient documentation

## 2009-05-04 DIAGNOSIS — G4700 Insomnia, unspecified: Secondary | ICD-10-CM | POA: Insufficient documentation

## 2009-05-04 DIAGNOSIS — R5383 Other fatigue: Secondary | ICD-10-CM

## 2009-05-06 LAB — CONVERTED CEMR LAB
ALT: 36 units/L — ABNORMAL HIGH (ref 0–35)
AST: 33 units/L (ref 0–37)
Albumin: 3.7 g/dL (ref 3.5–5.2)
Alkaline Phosphatase: 99 units/L (ref 39–117)
Bilirubin, Direct: 0 mg/dL (ref 0.0–0.3)
Total Bilirubin: 0.6 mg/dL (ref 0.3–1.2)
Total Protein: 7.3 g/dL (ref 6.0–8.3)

## 2009-05-08 ENCOUNTER — Encounter
Admission: RE | Admit: 2009-05-08 | Discharge: 2009-06-24 | Payer: Self-pay | Admitting: Physical Medicine & Rehabilitation

## 2009-05-08 ENCOUNTER — Ambulatory Visit: Payer: Self-pay | Admitting: Physical Medicine & Rehabilitation

## 2009-06-04 ENCOUNTER — Ambulatory Visit: Payer: Self-pay | Admitting: Family Medicine

## 2009-06-11 ENCOUNTER — Ambulatory Visit: Payer: Self-pay | Admitting: Physical Medicine & Rehabilitation

## 2009-06-27 ENCOUNTER — Emergency Department (HOSPITAL_COMMUNITY): Admission: EM | Admit: 2009-06-27 | Discharge: 2009-06-28 | Payer: Self-pay | Admitting: Emergency Medicine

## 2009-07-02 ENCOUNTER — Telehealth: Payer: Self-pay | Admitting: Family Medicine

## 2009-07-02 ENCOUNTER — Encounter
Admission: RE | Admit: 2009-07-02 | Discharge: 2009-09-30 | Payer: Self-pay | Admitting: Physical Medicine & Rehabilitation

## 2009-07-08 ENCOUNTER — Ambulatory Visit: Payer: Self-pay | Admitting: Physical Medicine & Rehabilitation

## 2009-07-20 ENCOUNTER — Telehealth: Payer: Self-pay | Admitting: Family Medicine

## 2009-08-03 ENCOUNTER — Telehealth: Payer: Self-pay | Admitting: Family Medicine

## 2009-08-05 ENCOUNTER — Ambulatory Visit: Payer: Self-pay | Admitting: Physical Medicine & Rehabilitation

## 2009-08-10 ENCOUNTER — Ambulatory Visit: Payer: Self-pay | Admitting: Family Medicine

## 2009-08-10 DIAGNOSIS — R079 Chest pain, unspecified: Secondary | ICD-10-CM | POA: Insufficient documentation

## 2009-08-11 ENCOUNTER — Encounter: Payer: Self-pay | Admitting: Family Medicine

## 2009-08-11 ENCOUNTER — Telehealth: Payer: Self-pay | Admitting: Family Medicine

## 2009-08-19 ENCOUNTER — Telehealth: Payer: Self-pay | Admitting: Family Medicine

## 2009-09-08 ENCOUNTER — Ambulatory Visit: Payer: Self-pay | Admitting: Physical Medicine & Rehabilitation

## 2009-10-01 ENCOUNTER — Encounter
Admission: RE | Admit: 2009-10-01 | Discharge: 2009-12-30 | Payer: Self-pay | Admitting: Physical Medicine & Rehabilitation

## 2009-10-07 ENCOUNTER — Ambulatory Visit: Payer: Self-pay | Admitting: Physical Medicine & Rehabilitation

## 2009-10-15 ENCOUNTER — Encounter: Payer: Self-pay | Admitting: Family Medicine

## 2009-10-15 ENCOUNTER — Telehealth: Payer: Self-pay | Admitting: Family Medicine

## 2009-10-19 ENCOUNTER — Telehealth: Payer: Self-pay | Admitting: Family Medicine

## 2009-11-04 ENCOUNTER — Ambulatory Visit: Payer: Self-pay | Admitting: Physical Medicine & Rehabilitation

## 2009-12-14 ENCOUNTER — Ambulatory Visit: Payer: Self-pay | Admitting: Physical Medicine & Rehabilitation

## 2009-12-14 ENCOUNTER — Encounter: Admission: RE | Admit: 2009-12-14 | Discharge: 2009-12-14 | Payer: Self-pay | Admitting: Family Medicine

## 2009-12-16 ENCOUNTER — Encounter (INDEPENDENT_AMBULATORY_CARE_PROVIDER_SITE_OTHER): Payer: Self-pay | Admitting: *Deleted

## 2010-01-06 ENCOUNTER — Encounter
Admission: RE | Admit: 2010-01-06 | Discharge: 2010-04-06 | Payer: Self-pay | Admitting: Physical Medicine & Rehabilitation

## 2010-01-13 ENCOUNTER — Ambulatory Visit: Payer: Self-pay | Admitting: Physical Medicine & Rehabilitation

## 2010-02-02 ENCOUNTER — Ambulatory Visit: Payer: Self-pay | Admitting: Family Medicine

## 2010-02-02 LAB — CONVERTED CEMR LAB: Vit D, 25-Hydroxy: 25 ng/mL — ABNORMAL LOW (ref 30–89)

## 2010-02-03 LAB — CONVERTED CEMR LAB
ALT: 45 units/L — ABNORMAL HIGH (ref 0–35)
AST: 46 units/L — ABNORMAL HIGH (ref 0–37)
Albumin: 3.9 g/dL (ref 3.5–5.2)
Alkaline Phosphatase: 123 units/L — ABNORMAL HIGH (ref 39–117)
BUN: 16 mg/dL (ref 6–23)
Basophils Absolute: 0 10*3/uL (ref 0.0–0.1)
Basophils Relative: 0.5 % (ref 0.0–3.0)
Bilirubin, Direct: 0.1 mg/dL (ref 0.0–0.3)
CO2: 29 meq/L (ref 19–32)
Calcium: 9.7 mg/dL (ref 8.4–10.5)
Chloride: 100 meq/L (ref 96–112)
Cholesterol: 217 mg/dL — ABNORMAL HIGH (ref 0–200)
Creatinine, Ser: 0.8 mg/dL (ref 0.4–1.2)
Direct LDL: 161.2 mg/dL
Eosinophils Absolute: 0.2 10*3/uL (ref 0.0–0.7)
Eosinophils Relative: 2.9 % (ref 0.0–5.0)
GFR calc non Af Amer: 79.38 mL/min (ref >60)
Glucose, Bld: 113 mg/dL — ABNORMAL HIGH (ref 70–99)
HCT: 37.5 % (ref 36.0–46.0)
HDL: 40.6 mg/dL (ref >39.00)
Hemoglobin: 12.9 g/dL (ref 12.0–15.0)
Lymphocytes Relative: 30.2 % (ref 12.0–46.0)
Lymphs Abs: 1.9 10*3/uL (ref 0.7–4.0)
MCHC: 34.5 g/dL (ref 30.0–36.0)
MCV: 91.9 fL (ref 78.0–100.0)
Monocytes Absolute: 0.4 10*3/uL (ref 0.1–1.0)
Monocytes Relative: 6.2 % (ref 3.0–12.0)
Neutro Abs: 3.9 10*3/uL (ref 1.4–7.7)
Neutrophils Relative %: 60.2 % (ref 43.0–77.0)
Platelets: 287 10*3/uL (ref 150.0–400.0)
Potassium: 4.2 meq/L (ref 3.5–5.1)
RBC: 4.07 M/uL (ref 3.87–5.11)
RDW: 12.6 % (ref 11.5–14.6)
Sodium: 140 meq/L (ref 135–145)
TSH: 5.49 microintl units/mL (ref 0.35–5.50)
Total Bilirubin: 0.5 mg/dL (ref 0.3–1.2)
Total CHOL/HDL Ratio: 5
Total Protein: 6.7 g/dL (ref 6.0–8.3)
Triglycerides: 193 mg/dL — ABNORMAL HIGH (ref 0.0–149.0)
VLDL: 38.6 mg/dL (ref 0.0–40.0)
WBC: 6.4 10*3/uL (ref 4.5–10.5)

## 2010-02-08 ENCOUNTER — Ambulatory Visit: Payer: Self-pay | Admitting: Family Medicine

## 2010-02-10 ENCOUNTER — Ambulatory Visit: Payer: Self-pay | Admitting: Physical Medicine & Rehabilitation

## 2010-03-09 ENCOUNTER — Ambulatory Visit: Payer: Self-pay | Admitting: Physical Medicine & Rehabilitation

## 2010-04-07 ENCOUNTER — Ambulatory Visit: Payer: Self-pay | Admitting: Physical Medicine & Rehabilitation

## 2010-04-07 ENCOUNTER — Encounter
Admission: RE | Admit: 2010-04-07 | Discharge: 2010-04-07 | Payer: Self-pay | Source: Home / Self Care | Attending: Physical Medicine & Rehabilitation | Admitting: Physical Medicine & Rehabilitation

## 2010-05-03 ENCOUNTER — Encounter
Admission: RE | Admit: 2010-05-03 | Discharge: 2010-06-26 | Payer: Self-pay | Source: Home / Self Care | Attending: Physical Medicine & Rehabilitation | Admitting: Physical Medicine & Rehabilitation

## 2010-05-07 ENCOUNTER — Ambulatory Visit: Payer: Self-pay | Admitting: Physical Medicine & Rehabilitation

## 2010-05-07 ENCOUNTER — Ambulatory Visit: Payer: Self-pay | Admitting: Internal Medicine

## 2010-05-07 ENCOUNTER — Encounter: Payer: Self-pay | Admitting: Family Medicine

## 2010-06-07 ENCOUNTER — Ambulatory Visit: Payer: Self-pay | Admitting: Physical Medicine & Rehabilitation

## 2010-07-02 ENCOUNTER — Encounter
Admission: RE | Admit: 2010-07-02 | Discharge: 2010-07-07 | Payer: Self-pay | Source: Home / Self Care | Attending: Physical Medicine & Rehabilitation | Admitting: Physical Medicine & Rehabilitation

## 2010-07-07 ENCOUNTER — Ambulatory Visit
Admission: RE | Admit: 2010-07-07 | Discharge: 2010-07-07 | Payer: Self-pay | Source: Home / Self Care | Attending: Physical Medicine & Rehabilitation | Admitting: Physical Medicine & Rehabilitation

## 2010-07-14 ENCOUNTER — Other Ambulatory Visit: Payer: Self-pay | Admitting: Family Medicine

## 2010-07-14 ENCOUNTER — Encounter (INDEPENDENT_AMBULATORY_CARE_PROVIDER_SITE_OTHER): Payer: Self-pay | Admitting: *Deleted

## 2010-07-14 ENCOUNTER — Ambulatory Visit
Admission: RE | Admit: 2010-07-14 | Discharge: 2010-07-14 | Payer: Self-pay | Source: Home / Self Care | Attending: Family Medicine | Admitting: Family Medicine

## 2010-07-14 ENCOUNTER — Encounter: Payer: Self-pay | Admitting: Family Medicine

## 2010-07-14 DIAGNOSIS — R7309 Other abnormal glucose: Secondary | ICD-10-CM | POA: Insufficient documentation

## 2010-07-14 DIAGNOSIS — R1310 Dysphagia, unspecified: Secondary | ICD-10-CM | POA: Insufficient documentation

## 2010-07-14 LAB — HEMOGLOBIN A1C: Hgb A1c MFr Bld: 6.5 % (ref 4.6–6.5)

## 2010-07-15 LAB — CONVERTED CEMR LAB: Vit D, 25-Hydroxy: 33 ng/mL (ref 30–89)

## 2010-07-18 ENCOUNTER — Encounter: Payer: Self-pay | Admitting: Internal Medicine

## 2010-07-18 ENCOUNTER — Encounter: Payer: Self-pay | Admitting: Family Medicine

## 2010-07-29 NOTE — Consult Note (Signed)
Summary: [facility]   Imported By: Lanelle Bal 01/11/2008 11:31:31  _____________________________________________________________________  External Attachment:    Type:   Image     Comment:   External Document  Appended Document: [facility] note

## 2010-07-29 NOTE — Consult Note (Signed)
Summary: [facility]   Imported By: Lanelle Bal 03/19/2008 13:07:47  _____________________________________________________________________  External Attachment:    Type:   Image     Comment:   External Document
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
## 2010-07-29 NOTE — Miscellaneous (Signed)
Summary: BONE DENSITY  Clinical Lists Changes  Orders: Added new Test order of T-Bone Densitometry (77080) - Signed Added new Test order of T-Lumbar Vertebral Assessment (77082) - Signed 

## 2010-07-29 NOTE — Miscellaneous (Signed)
Summary: Bone Density  Clinical Lists Changes  Orders: Added new Test order of T-Bone Densitometry (77080) - Signed Added new Test order of T-Lumbar Vertebral Assessment (77082) - Signed 
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
## 2010-07-29 NOTE — Progress Notes (Signed)
Summary: Mirtazapine 15mg  refill  Phone Note Refill Request Call back at [phone] Message from:  [facility] on August 03, 2009 9:15 AM  Refills Requested: Medication #1:  MIRTAZAPINE 15 MG TABS 1 by mouth every night. [facility] faxed request for refill on Mirtazapine. This med was refilled on 07/20/09 at [facility]. [facility] said to disregard [facility]. Unable to reach pt by phone. Lewanda Rife LPN  August 03, 2009 9:16 AM    Method Requested: Telephone to Pharmacy Initial call taken by: Lewanda Rife LPN,  August 03, 2009 9:16 AM

## 2010-07-29 NOTE — Letter (Signed)
Summary: Results Follow up Letter  [facility]  [address] [place], [place] [address]   Phone: [phone]  Fax: [phone]    05/16/2007 MRN: [id number]  [facility] [address] [place], [place]  [address]  Dear Ms. Chamorro,  The following are the results of your recent test(s):  Test         Result    Pap Smear:        Normal _____  Not Normal _____ Comments: ______________________________________________________ Cholesterol: LDL(Bad cholesterol):         Your goal is less than:         HDL (Good cholesterol):       Your goal is more than: Comments:  ______________________________________________________ Mammogram:        Normal _____  Not Normal _____ Comments:  ___________________________________________________________________ Hemoccult:        Normal _____  Not normal _______ Comments:    _____________________________________________________________________ Other Tests:  all labs and nmr  We routinely do not discuss normal results over the telephone.  If you desire a copy of the results, or you have any questions about this information we can discuss them at your next office visit.   Sincerely,
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
## 2010-07-29 NOTE — Letter (Signed)
Summary: Results Follow up Letter   at [place]  [address]   [place], [place] [address]   Phone: [phone]  Fax: [phone]    01/17/2007 MRN: [id number]   IDX  [name] [address] [place], [name]  [address]  Dear [name],  The following are the results of your recent test(s):  Test         Result    Pap Smear:        Normal _____  Not Normal _____ Comments: ______________________________________________________ Cholesterol: LDL(Bad cholesterol):         Your goal is less than:         HDL (Good cholesterol):       Your goal is more than: Comments:  ______________________________________________________ Mammogram:        Normal _____  Not Normal _____ Comments:  ___________________________________________________________________ Hemoccult:        Normal _____  Not normal _______ Comments:    _____________________________________________________________________ Other Tests:  Please see attached results and comments   We routinely do not discuss normal results over the telephone.  If you desire a copy of the results, or you have any questions about this information we can discuss them at your next office visit.   Sincerely,    
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
## 2010-07-29 NOTE — Assessment & Plan Note (Signed)
Summary: 4 MONTH ROA AND DISCUSS LABS///SPH   Vital Signs:  Patient Profile:   60 Years Old Female Weight:      206.6 pounds Pulse rate:   72 / minute Resp:     14 per minute BP sitting:   132 / 78  (left arm) Cuff size:   large  Vitals Entered By: Shonna Chock (December 18, 2007 1:04 PM)                 Chief Complaint:  52month follow-up and discuss labs.  History of Present Illness: Vit D level is 35 on 3000 International Units once daily for 4 months; up from value of 31 in 7/08. In  11/08 vit D was 55 on 45,000 International Units /week.On Calcium 600mg  two times a day. No PMH fractures ; M had osteoporosis.Gyn @ [facility] recommended BMD. Not walking due to back issues; all fusions were @ cervical level.A1c & K+ WNL; on no diet. TG were 204  in 12/08. F, P aunt ,Puncle & 3 P 1st cousins had AV replacement    Current Allergies (reviewed today): ! * CYMBALTA ! DOXYCYCLINE PCN * ALEVE BIAXIN FLAGYL IMITREX * MAXALT   Family History:    Father: DM,HTN,MI @ 35    Mother: Alsheimer's, [place] 78,DM,HTN,breast CA    Siblings: none    Strong FH AV disorder    Review of Systems  General      Complains of sleep disorder.      Using old Rx of Ambien  CV      Denies bluish discoloration of lips or nails, difficulty breathing at night, difficulty breathing while lying down, swelling of feet, and swelling of hands.  MS      Complains of joint pain and thoracic pain.      PMH cervical fusion; tear & bone spur R shoulder  Neuro      Complains of numbness and tingling.      In fingers & toes occa  Psych      Complains of depression.      Denies anxiety, easily angered, easily tearful, and irritability.      Suboptimal control with present dose. She attends [facility] ; on Percocet & Gabapentin   Physical Exam  General:     Well-developed,well-nourished,in no acute distress; alert  throughout examination Lungs:     Normal respiratory effort, chest expands  symmetrically. Lungs are clear to auscultation, no crackles or wheezes. Heart:     normal rate, regular rhythm, no gallop, no rub, no JVD, no HJR, and grade 1 /6 systolic murmur.   Pulses:     R and L carotid,radial,dorsalis pedis and posterior tibial pulses are full and equal bilaterally Extremities:     No cyanosis or edema Neurologic:     alert & oriented X3.   Skin:     Intact without suspicious lesions or rashes Psych:     Focused & intelligent.flat affect.      Impression & Recommendations:  Problem # 1:  CARDIAC MURMUR, AORTIC (ICD-785.2)  Orders: Echo Referral (Echo)   Problem # 2:  HYPERLIPIDEMIA (ICD-272.2)  Her updated medication list for this problem includes:    Crestor 10 Mg Tabs (Rosuvastatin calcium) .Marland Kitchen... 1 by mouth 3 days a week   Problem # 3:  DEFICIENCY, VITAMIN D NOS (ICD-268.9)  Orders: Radiology Referral (Radiology)   Problem # 4:  DEPRESSIVE DISORDER (ICD-311)  Her updated medication list for this problem includes:  Diazepam 5 Mg Tabs (Diazepam) .Marland Kitchen... 1 by mouth at bedtime   Complete Medication List: 1)  Tylenol  .... Prn 2)  Vitamin D3 1000 Iu  .Marland Kitchen.. 3 pills once daily 3)  Zolpidem Tartrate 10 Mg Tabs (Zolpidem tartrate) .... Prn 4)  Xyzal 5 Mg Tabs (Levocetirizine dihydrochloride) .... 1/2 tab qd 5)  Crestor 10 Mg Tabs (Rosuvastatin calcium) .Marland Kitchen.. 1 by mouth 3 days a week 6)  Diazepam 5 Mg Tabs (Diazepam) .Marland Kitchen.. 1 by mouth at bedtime 7)  Percocet 7.5-325 Mg Tabs (Oxycodone-acetaminophen) .Marland Kitchen.. 1 by mouth hs prn 8)  Aciphex 20 Mg Tbec (Rabeprazole sodium) .Marland Kitchen.. 1 by mouth once daily prn 9)  Tums Ex Prn  10)  Effexor Xr 150 Mg Cp24 (venlafaxine Hcl)  .Marland Kitchen.. 1 q am 11)  Gabapentin 300 Mg Caps (Gabapentin) .... As needed migraines 12)  Smz-tmp Ds 800-160 Mg Tabs (Sulfamethoxazole-trimethoprim) .Marland Kitchen.. 1 two times a day with 8 oz water   Patient Instructions: 1)  Follow [employer] low carb & mypyramidtracker.gov SBE prophylaxis pre  dental work. Walk 90 minutes / week for bone integrity. 2)  Please schedule a follow-up appointment in 6 months. 3)  Hepatic Panel, CPK prior to visit, ICD-9:995.20 4)  Lipid Panel prior to visit, ICD-9:272.4   Prescriptions: EFFEXOR XR 150 MG  CP24 (VENLAFAXINE HCL) 1 Q AM  #90 x 1   Entered and Authorized by:   Marga Melnick MD   Signed by:   Marga Melnick MD on 12/18/2007   Method used:   Print then Give to Patient   RxID:   [id number]  ]  Appended Document: 4 MONTH ROA AND DISCUSS LABS///SPH

## 2010-07-29 NOTE — Progress Notes (Signed)
Summary: refill reques for xyzal  Phone Note Refill Request Message from:  Fax from Pharmacy  Refills Requested: Medication #1:  XYZAL 5 MG  TABS 1 by mouth daily Faxed form from [facility] is on your desk.  Initial call taken by: Lowella Petties CMA,  August 11, 2009 9:08 AM

## 2010-07-29 NOTE — Letter (Signed)
Summary: Results Follow up Letter  [place] [facility]  [address] [place], [place] [address]   Phone: [phone]  Fax: [phone]    12/15/2008 MRN: [id number]     [name] [address] [place], [place]  [address]    Dear [name],  The following are the results of your recent test(s):  Test         Result    Pap Smear:        Normal _____  Not Normal _____ Comments: ______________________________________________________ Cholesterol: LDL(Bad cholesterol):         Your goal is less than:         HDL (Good cholesterol):       Your goal is more than: Comments:  ______________________________________________________ Mammogram:        Normal _x____  Not Normal _____ Comments:Repeat in 1 year  ___________________________________________________________________ Hemoccult:        Normal _____  Not normal _______ Comments:    _____________________________________________________________________ Other Tests:    We routinely do not discuss normal results over the telephone.  If you desire a copy of the results, or you have any questions about this information we can discuss them at your next office visit.   Sincerely,    Hannah Beat MD

## 2010-07-29 NOTE — Letter (Signed)
Summary: Results Follow up Letter  [place] [facility] [place]  [address] [place], [place] [address]   Phone: [phone]  Fax: [phone]    05/28/2007 MRN: [id number]  [facility] [address] [place], [place]  [address]  Dear Ms. Elbe,  The following are the results of your recent test(s):  Test         Result    Pap Smear:        Normal _____  Not Normal _____ Comments: ______________________________________________________ Cholesterol: LDL(Bad cholesterol):         Your goal is less than:         HDL (Good cholesterol):       Your goal is more than: Comments:  ______________________________________________________ Mammogram:        Normal _____  Not Normal _____ Comments:  ___________________________________________________________________ Hemoccult:        Normal _____  Not normal _______ Comments:    _____________________________________________________________________ Other Tests:  NMR results  We routinely do not discuss normal results over the telephone.  If you desire a copy of the results, or you have any questions about this information we can discuss them at your next office visit.   Sincerely,

## 2010-07-29 NOTE — Assessment & Plan Note (Signed)
Summary: acute/sorethroat low gr fever,cough yeloow phlegm/alr   Vital Signs:  Patient Profile:   60 Years Old Female Weight:      195.13 pounds Temp:     100.0 degrees F oral Pulse rate:   60 / minute Pulse rhythm:   regular Resp:     16 per minute BP sitting:   130 / 72  (left arm) Cuff size:   large  Pt. in pain?   no  Vitals Entered By: Wendall Stade (October 12, 2007 1:37 PM)                  Chief Complaint:  sick for two days and URI symptoms.  History of Present Illness: started two days ago sore throat glands swollen ears hurt hurts to swallow  cough with green yellow mucus nasal congestion green yellow headache  URI Symptoms; Rx: Neti pot , NSAID, Tylenol      This is a 60 year old woman who presents with URI symptoms.  The patient reports nasal congestion, purulent nasal discharge, sore throat, productive cough, and sick contacts, but denies clear nasal discharge, dry cough, and earache.  Associated symptoms include fever, low-grade fever (<100.5 degrees), use of an antipyretic, and response to antipyretic.  The patient denies stiff neck, dyspnea, wheezing, rash, vomiting, and diarrhea.  The patient also reports headache and muscle aches.  The patient denies itchy watery eyes, itchy throat, sneezing, seasonal symptoms, response to antihistamine, and severe fatigue.  Risk factors for Strep sinusitis include tender adenopathy.  The patient denies the following risk factors for Strep sinusitis: double sickening, tooth pain, and Strep exposure.      Current Allergies: ! * CYMBALTA ! DOXYCYCLINE PCN * ALEVE BIAXIN FLAGYL IMITREX * MAXALT      Physical Exam  General:     Well-developed,well-nourished,in no acute distress; alert,appropriate and cooperative throughout examination Eyes:     No corneal or conjunctival inflammation noted. EOMI. Perrla.  Vision grossly normal. Ears:     External ear exam shows no significant lesions or deformities.   Otoscopic examination reveals clear canals, tympanic membranes are intact bilaterally without bulging, retraction, inflammation or discharge. Hearing is grossly normal bilaterally. Nose:     External nasal examination shows no deformity or inflammation. Nasal mucosa are  erythematous without lesions or exudates. Mouth:     Oral mucosa and oropharynx without lesions or exudates.  Teeth in good repair. Minimal erythema Lungs:     Normal respiratory effort, chest expands symmetrically. Lungs are clear to auscultation, no crackles or wheezes. Cervical Nodes:     shotty LA bilat Axillary Nodes:     No palpable lymphadenopathy    Impression & Recommendations:  Problem # 1:  SINUSITIS- ACUTE-NOS (ICD-461.9)  Her updated medication list for this problem includes:    Smz-tmp Ds 800-160 Mg Tabs (Sulfamethoxazole-trimethoprim) .Marland Kitchen... 1 two times a day with 8 oz water   Complete Medication List: 1)  Tylenol  .... Prn 2)  Vitamin D3 1000 Iu  .Marland Kitchen.. 3 pills once daily 3)  Zolpidem Tartrate 10 Mg Tabs (Zolpidem tartrate) .... Prn 4)  Xyzal 5 Mg Tabs (Levocetirizine dihydrochloride) .... 1/2 tab qd 5)  Crestor 10 Mg Tabs (Rosuvastatin calcium) .Marland Kitchen.. 1 by mouth 3 days a week 6)  Diazepam 5 Mg Tabs (Diazepam) .Marland Kitchen.. 1 by mouth q6-8 hours prn 7)  Percocet 7.5-325 Mg Tabs (Oxycodone-acetaminophen) .Marland Kitchen.. 1 by mouth hs prn 8)  Aciphex 20 Mg Tbec (Rabeprazole sodium) .Marland Kitchen.. 1 by mouth once  daily prn 9)  Tums Ex Prn  10)  Effexor Xr 75 Mg Cp24 (Venlafaxine hcl) .Marland Kitchen.. 1 q am 11)  Gabapentin 300 Mg Caps (Gabapentin) .... As needed migraines 12)  Smz-tmp Ds 800-160 Mg Tabs (Sulfamethoxazole-trimethoprim) .Marland Kitchen.. 1 two times a day with 8 oz water   Patient Instructions: 1)  Drink as much fluid as you can tolerate for the next few days.    Prescriptions: SMZ-TMP DS 800-160 MG  TABS (SULFAMETHOXAZOLE-TRIMETHOPRIM) 1 two times a day with 8 oz water  #20 x 0   Entered and Authorized by:   Marga Melnick MD   Signed  by:   Marga Melnick MD on 10/12/2007   Method used:   Print then Give to Patient   RxID:   [id number]  ]
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
## 2010-07-29 NOTE — Progress Notes (Signed)
Summary: SORE THROAT  Phone Note Call from Patient Call back at Home Phone [phone]   Caller: Patient Reason for Call: Acute Illness Summary of Call: DR. HOPPER PATIENT FEELS LIKE SHE MIGHT HAVE STREP THROAT. HER THROAT STARTED HURTING WEDNESDAY, SHE IS COUGHING UP YELLOW & GREEN MUCUS, CONGESTION, LOW GRADE FEVER, AND SHE WOULD LIKE TO BE SEEN.  Initial call taken by: Charolette Child,  October 12, 2007 11:11 AM  Follow-up for Phone Call        spoke with pt ov sched pt infomred to come now per Olegario Messier, pt said can be there in 30 min...................................................................Marland KitchenKandice Hams  October 12, 2007 12:48 PM  Follow-up by: Kandice Hams,  October 12, 2007 12:48 PM

## 2010-07-29 NOTE — Procedures (Signed)
Summary: Gastroenterology endo  Gastroenterology endo   Imported By: Donneta Romberg 09/29/2007 13:48:01  _____________________________________________________________________  External Attachment:    Type:   Image     Comment:   External Document

## 2010-07-29 NOTE — Assessment & Plan Note (Signed)
Summary: discuss labs per md   Vital Signs:  Patient Profile:   60 Years Old Female Weight:      196.50 pounds Pulse rate:   60 / minute Pulse rhythm:   regular Resp:     16 per minute BP sitting:   110 / 60  (left arm) Cuff size:   large  Pt. in pain?   no  Vitals Entered By: Wendall Stade (August 21, 2007 11:32 AM)                  Chief Complaint:  follow up labs.  History of Present Illness: On Vit D 15,000 International Units weekly X 11 weeks ; level now 31. Prev  56 on 50,000 International Units weekly.S/P 7  total  steroid injections into spine , shoulder over 18 months, 4 injections in past 4 months. Also on  IV steroids @ C5-6 fusion 07/24/07 .  On no calcium after GI evaluation.                           Based on NMR LDL goal = < 90; on Crestor 10 mg M, W,F  LDL was 106.5 , down from 161. No diet; no CVE. TG now 204, HDL 46.  Lipid Management History:      Positive NCEP/ATP III risk factors include female age 96 years old or older, early menopause without estrogen hormone replacement, and hypertension.  Negative NCEP/ATP III risk factors include non-diabetic, no family history for ischemic heart disease, non-tobacco-user status, no ASHD (atherosclerotic heart disease), no prior stroke/TIA, no peripheral vascular disease, and no history of aortic aneurysm.       Current Allergies (reviewed today): ! * CYMBALTA PCN EFFEXOR * ALEVE BIAXIN FLAGYL IMITREX * MAXALT     Review of Systems  Psych      Complains of depression, easily tearful, and irritability.      Denies anxiety and easily angered.      Symptoms worse since surgery; Effexor prev effective   Physical Exam  General:     Well-developed,well-nourished,in no acute distress; alert,appropriate and cooperative throughout examination Lungs:     Normal respiratory effort, chest expands symmetrically. Lungs are clear to auscultation, no crackles or wheezes. Heart:     Normal rate and regular  rhythm. S1 and S2 normal without gallop, murmur, click, rub or other extra sounds. Abdomen:     Bowel sounds positive,abdomen soft and non-tender without masses, organomegaly or hernias noted. Pulses:     R and L carotid,radial,dorsalis pedis and posterior tibial pulses are full and equal bilaterally Skin:     Intact without suspicious lesions or rashes Psych:     Oriented X3, memory intact for recent and remote, normally interactive, good eye contact, flat affect, and subdued.      Impression & Recommendations:  Problem # 1:  HYPERLIPIDEMIA (ICD-272.2)  Her updated medication list for this problem includes:    Crestor 10 Mg Tabs (Rosuvastatin calcium) .Marland Kitchen... 1 by mouth 3 days a week out   Problem # 2:  B12 DEFICIENCY (ICD-266.2)  Problem # 3:  DEPRESSIVE DISORDER (ICD-311)  Her updated medication list for this problem includes:    Diazepam 5 Mg Tabs (Diazepam) .Marland Kitchen... 1 by mouth q6-8 hours prn    Effexor Xr 75 Mg Cp24 (Venlafaxine hcl) .Marland Kitchen... 1 q am   Complete Medication List: 1)  Tylenol  .... Prn 2)  Vitamin D3 1000 Iu  .Marland KitchenMarland KitchenMarland Kitchen  3 pills once daily 3)  Midrin 325-65-100 Mg Caps (Apap-isometheptene-dichloral) .... As needed migraines 4)  Zolpidem Tartrate 10 Mg Tabs (Zolpidem tartrate) .... Prn 5)  Xyzal 5 Mg Tabs (Levocetirizine dihydrochloride) .... 1/2 tab qd 6)  Crestor 10 Mg Tabs (Rosuvastatin calcium) .Marland Kitchen.. 1 by mouth 3 days a week out 7)  Diazepam 5 Mg Tabs (Diazepam) .Marland Kitchen.. 1 by mouth q6-8 hours prn 8)  Percocet 7.5-325 Mg Tabs (Oxycodone-acetaminophen) .Marland Kitchen.. 1 by mouth hs prn 9)  Aciphex 20 Mg Tbec (Rabeprazole sodium) .Marland Kitchen.. 1 by mouth once daily prn 10)  Tums Ex Prn  11)  Effexor Xr 75 Mg Cp24 (Venlafaxine hcl) .Marland Kitchen.. 1 q am  Lipid Assessment/Plan:      Based on NCEP/ATP III, the patient's risk factor category is "2 or more risk factors and a calculated 10 year CAD risk of < 20%".  From this information, the patient's calculated lipid goals are as follows: Total cholesterol  goal is 200; LDL cholesterol goal is 130; HDL cholesterol goal is 40; Triglyceride goal is 150.  Her LDL cholesterol goal has not been met.  Secondary causes for hyperlipidemia have been ruled out.  She has been counseled on adjunctive measures for lowering her cholesterol and has been provided with dietary instructions.     Patient Instructions: 1)  The New Sugar [employer]usters low carb diet. 2)  Please schedule a follow-up appointment in 4 months. 3)  Hepatic Panel, CPK prior to visit, ICD-9: 4)  Lipid Panel prior to visit, ICD-9: 5)  HbgA1C prior to visit, ICD-9: Also vit D level. Codes 995.20, 272.4. See LDL & HDL articles.    Prescriptions: EFFEXOR XR 75 MG  CP24 (VENLAFAXINE HCL) 1 Q AM  #30 x 5   Entered and Authorized by:   Marga Melnick MD   Signed by:   Marga Melnick MD on 08/21/2007   Method used:   Print then Give to Patient   RxID:   [id number] VITAMIN D3  1000 IU 3 pills once daily  #90 x 11   Entered and Authorized by:   Marga Melnick MD   Signed by:   Marga Melnick MD on 08/21/2007   Method used:   Print then Give to Patient   RxID:   [id number] CRESTOR 10 MG  TABS (ROSUVASTATIN CALCIUM) 1 by mouth 3 days a week out  #30 x 6   Entered and Authorized by:   Marga Melnick MD   Signed by:   Marga Melnick MD on 08/21/2007   Method used:   Print then Give to Patient   RxID:   [id number]  ]

## 2010-07-29 NOTE — Progress Notes (Signed)
Summary: insurance wont cover aciphex  Phone Note From Pharmacy   Caller: [facility] Summary of Call: Faxed form from Tenna Child is on your desk.  They will not cover aciphex and are asking if you want to change to something else or request a prior form.  Please advise. Initial call taken by: Lowella Petties CMA,  August 19, 2009 2:32 PM  Follow-up for Phone Call        call patient and ask - options include prilosec, protonix, prevacid Follow-up by: Hannah Beat MD,  August 19, 2009 2:51 PM  Additional Follow-up for Phone Call Additional follow up Details #1::        Left message on machine at home for patient to return call.  Linde Gillis CMA Duncan Dull)  August 19, 2009 3:50 PM   Left message for patient to return call.  Linde Gillis CMA Duncan Dull)  August 20, 2009 8:12 AM   Spoke with patient and she would like to try prevacid.  Uses [facility]   Additional Follow-up by: Linde Gillis CMA Duncan Dull),  August 20, 2009 9:40 AM    New/Updated Medications: LANSOPRAZOLE 30 MG CPDR (LANSOPRAZOLE) 1 by mouth daily, 30 minutes before breakfast Prescriptions: LANSOPRAZOLE 30 MG CPDR (LANSOPRAZOLE) 1 by mouth daily, 30 minutes before breakfast  #30 x 11   Entered and Authorized by:   Hannah Beat MD   Signed by:   Hannah Beat MD on 08/20/2009   Method used:   Electronically to        [facility]       [address]       [place], [place]  [address]       Ph: [phone]       Fax: [phone]   RxID:   [id number]

## 2010-07-29 NOTE — Letter (Signed)
Summary: New Patient letter  [facility]  [address] [place], [place] [address]   Phone: [phone]  Fax: [phone]       07/14/2010 MRN: [id number]  [name] [address] [place], [place]  [address]  Dear [name],  Welcome to the Gastroenterology Division at [facility].    You are scheduled to see Dr.  Melvia Heaps on 08-18-10 at 8:30am on the 3rd floor at [facility], [address]  We ask that you try to arrive at our office 15 minutes prior to your appointment time to allow for check-in.  We would like you to complete the enclosed self-administered evaluation form prior to your visit and bring it with you on the day of your appointment.  We will review it with you.  Also, please bring a complete list of all your medications or, if you prefer, bring the medication bottles and we will list them.  Please bring your insurance card so that we may make a copy of it.  If your insurance requires a referral to see a specialist, please bring your referral form from your primary care physician.  Co-payments are due at the time of your visit and may be paid by cash, check or credit card.     Your office visit will consist of a consult with your physician (includes a physical exam), any laboratory testing he/she may order, scheduling of any necessary diagnostic testing (e.g. x-ray, ultrasound, CT-scan), and scheduling of a procedure (e.g. Endoscopy, Colonoscopy) if required.  Please allow enough time on your schedule to allow for any/all of these possibilities.    If you cannot keep your appointment, please call [phone] to cancel or reschedule prior to your appointment date.  This allows [place] the opportunity to schedule an appointment for another patient in need of care.  If you do not cancel or reschedule by 5 p.m. the business day prior to your appointment date, you will be charged a $50.00 late cancellation/no-show fee.    Thank you for  choosing [facility] for your medical needs.  We appreciate the opportunity to care for you.  Please visit [place] at our website  to learn more about our practice.                     Sincerely,                                                             The Gastroenterology Division

## 2010-07-29 NOTE — Progress Notes (Signed)
Summary: call to patient about labs  Phone Note Outgoing Call   Call placed by: Redmond Baseman Call placed to: Patient Summary of Call: called patient and advised her that her vit D level has fallen quite a bit and Dr. Alwyn Ren wants to see her in the office.  I made her an appointment to see MD tomorrow ....................................................................Marland KitchenWendall Stade  August 20, 2007 12:14 PM

## 2010-07-29 NOTE — Letter (Signed)
Summary: Results Follow up Letter  [facility]  [address] [place], [place] [address]   Phone: [phone]  Fax: [phone]    01/02/2008 MRN: [id number]  [facility] [address] [place], [place]  [address]  Dear Ms. Moffa,  The following are the results of your recent test(s):  Test         Result    Pap Smear:        Normal _____  Not Normal _____ Comments: ______________________________________________________ Cholesterol: LDL(Bad cholesterol):         Your goal is less than:         HDL (Good cholesterol):       Your goal is more than: Comments:  ______________________________________________________ Mammogram:        Normal _____  Not Normal _____ Comments:  ___________________________________________________________________ Hemoccult:        Normal _____  Not normal _______ Comments:    _____________________________________________________________________ Other Tests:    We routinely do not discuss normal results over the telephone.  If you desire a copy of the results, or you have any questions about this information we can discuss them at your next office visit.   Sincerely,

## 2010-07-29 NOTE — Medication Information (Signed)
Summary: Aciphex/[facility]  Acip[facility]   Imported By: Lanelle Bal 12/12/2008 11:18:16  _____________________________________________________________________  External Attachment:    Type:   Image     Comment:   External Document

## 2010-07-29 NOTE — Progress Notes (Signed)
Summary: mirtazapine  Phone Note Refill Request Message from:  Fax from Pharmacy on July 20, 2009 9:37 AM  Refills Requested: Medication #1:  MIRTAZAPINE 15 MG TABS 1 by mouth every night.   Supply Requested: 1 month   Last Ref[facility]: [facility]18/2009 [id number]   Method Requested: Electronic Initial call taken by: Benny Lennert CMA Duncan Dull),  July 20, 2009 9:38 AM    Prescriptions: MIRTAZAPINE 15 MG TABS (MIRTAZAPINE) 1 by mouth every night  #30 x 2   Entered by:   Kerby Nora MD   Authorized by:   Hannah Beat MD   Signed by:   Kerby Nora MD on 07/20/2009   Method used:   Electronically to        [facility]       [address]       [place], [place]  [address]       Ph: [phone]       Fax: [phone]   RxID:   [id number]

## 2010-07-29 NOTE — Progress Notes (Signed)
Summary: celexa  Phone Note Refill Request Message from:  Scriptline on April 16, 2009 8:39 AM  Refills Requested: Medication #1:  CITALOPRAM HYDROBROMIDE 40 MG TABS take one tablet by mouth daily   Supply Requested: 3 months target [phone]   Method Requested: Electronic Initial call taken by: Benny Lennert CMA (AAMA),  April 16, 2009 8:39 AM    Prescriptions: CITALOPRAM HYDROBROMIDE 40 MG TABS (CITALOPRAM HYDROBROMIDE) take one tablet by mouth daily  #90 x 1   Entered and Authorized by:   Hannah Beat MD   Signed by:   Hannah Beat MD on 04/16/2009   Method used:   Electronically to        [facility]       [address]       [place], [place]  [address]       Ph: [phone]       Fax: [phone]   RxID:   [id number]

## 2010-07-29 NOTE — Letter (Signed)
Summary: Results Follow up Letter  [facility]  [address] [place], [place] [address]   Phone: [phone]  Fax: [phone]    01/17/2007 MRN: [id number]   [name] [address] [place], [place]  [address]  Dear Ms. Gaugh,  The following are the results of your recent test(s):  Test         Result    Pap Smear:        Normal _____  Not Normal _____ Comments: ______________________________________________________ Cholesterol: LDL(Bad cholesterol):         Your goal is less than:         HDL (Good cholesterol):       Your goal is more than: Comments:  ______________________________________________________ Mammogram:        Normal _____  Not Normal _____ Comments:  ___________________________________________________________________ Hemoccult:        Normal _____  Not normal _______ Comments:    _____________________________________________________________________ Other Tests:  Please see attached results and comments   We routinely do not discuss normal results over the telephone.  If you desire a copy of the results, or you have any questions about this information we can discuss them at your next office visit.   Sincerely,

## 2010-07-29 NOTE — Letter (Signed)
Summary: Results Follow up Letter  [facility]  [address] [place], [place] [address]   Phone: [phone]  Fax: [phone]    12/16/2009 MRN: [id number]     VALJEAN RUPPEL [address] [place], [place]  [address]    Dear Ms. Helder,  The following are the results of your recent test(s):  Test         Result    Pap Smear:        Normal _____  Not Normal _____ Comments: ______________________________________________________ Cholesterol: LDL(Bad cholesterol):         Your goal is less than:         HDL (Good cholesterol):       Your goal is more than: Comments:  ______________________________________________________ Mammogram:        Normal __x___  Not Normal _____ Comments:Repeat in 1 year  ___________________________________________________________________ Hemoccult:        Normal _____  Not normal _______ Comments:    _____________________________________________________________________ Other Tests:    We routinely do not discuss normal results over the telephone.  If you desire a copy of the results, or you have any questions about this information we can discuss them at your next office visit.   Sincerely,  Hannah Beat MD

## 2010-07-29 NOTE — Medication Information (Signed)
Summary: [facility]  [facility]   Imported By: Lanelle Bal 10/21/2009 11:32:00  _____________________________________________________________________  External Attachment:    Type:   Image     Comment:   External Document

## 2010-07-29 NOTE — Letter (Signed)
Summary: Primary Care Consult Scheduled Letter  [facility]  [address] [place], [place] [address]   Phone: [phone]  Fax: [phone]      12/19/2007 MRN: [id number]  [facility] [address] [place], [place]  [address]    Dear Ms. Mcgeachy,      We have scheduled an appointment for you.  At the recommendation of Dr.Hopper, we have scheduled you a Bone Density on July 6th at 9:30am.  Their address is [address] [place] in the basement. The office phone number is [phone].  If this appointment day and time is not convenient for you, please feel free to call the office of the doctor you are being referred to at the number listed above and reschedule the appointment.     It is important for you to keep your scheduled appointments. We are here to make sure you are given good patient care. If you have questions or you have made changes to your appointment, please notify us at  [phone], ask for Tiffany.    Thank you,  Patient Care Coordinator [place] at [facility]

## 2010-07-29 NOTE — Assessment & Plan Note (Signed)
Summary: ST,CONGESTION/CLE   Vital Signs:  Patient profile:   60 year old female Height:      66 inches Weight:      224.50 pounds BMI:     36.37 Temp:     99.5 degrees F oral Pulse rate:   84 / minute Pulse rhythm:   regular BP sitting:   144 / 76  (left arm) Cuff size:   regular  Vitals Entered By: Delilah Shan CMA (AAMA) (June 04, 2009 11:01 AM) CC: ST, congestion   History of Present Illness: 60 yo with 3 days of sinus congestion, runny nose, dry cough, scratchy throat. Has had chills and subjective fever. No nausea, vomiting, diarrhea, rash, headache.  No known sick contacts. Started taking mucinex this morning with some relief of symtpoms.  Current Medications (verified): 1)  Zolpidem Tartrate 10 Mg  Tabs (Zolpidem Tartrate) .... Prn 2)  Xyzal 5 Mg  Tabs (Levocetirizine Dihydrochloride) .Marland Kitchen.. 1 By Mouth Daily 3)  Aciphex 20 Mg  Tbec (Rabeprazole Sodium) .Marland Kitchen.. 1 By Mouth Once Daily Prn 4)  Gabapentin 600 Mg Caps (Gabapentin) .Marland Kitchen.. 1 By Mouth Three Times A Day 5)  Calcium 600 600 Mg  Tabs (Calcium Carbonate) .Marland Kitchen.. 1 By Mouth Two Times A Day 6)  Citalopram Hydrobromide 40 Mg Tabs (Citalopram Hydrobromide) .... Take One Tablet By Mouth Daily 7)  Viamin D 1000 .... Daily 8)  Glucosamine 500 Mg Caps (Glucosamine Sulfate) .... Take 2 Tablets 2 Times Dailly 9)  Morphine Sulfate 15 Mg Tabs (Morphine Sulfate) .... Three Times Daily 10)  Melatonin 3 Mg Tabs (Melatonin) .... At Bedtime 11)  Peri-Colace 8.6-50 Mg Tabs (Sennosides-Docusate Sodium) .... 3 Times A Day 12)  Magnesium Glycinate Plus 110 Mg Caps (Magnesium) .... 2 Times Dailly 13)  Fish Oil 500 Mg Caps (Omega-3 Fatty Acids) .... 3 Times Daily 14)  Mirtazapine 15 Mg Tabs (Mirtazapine) .Marland Kitchen.. 1 By Mouth Every Night  Allergies: 1)  ! * Cymbalta 2)  ! Doxycycline 3)  ! Flexeril 4)  ! Crestor (Rosuvastatin Calcium) 5)  ! * Tramadol 6)  ! * Oxycodone 7)  ! * Oxycontin 8)  ! Effexor 9)  Pcn 10)  * Aleve 11)  Biaxin  12)  Flagyl 13)  Imitrex 14)  * Maxalt  Review of Systems      See HPI General:  Complains of chills and fever. ENT:  Complains of nasal congestion and sinus pressure; denies earache and sore throat. CV:  Denies chest pain or discomfort. Resp:  Complains of cough; denies shortness of breath, sputum productive, and wheezing. GI:  Denies abdominal pain, diarrhea, nausea, and vomiting. Derm:  Denies rash.  Physical Exam  General:  Well-developed,well-nourished,in no acute distress; alert,appropriate and cooperative throughout examination Ears:  mild fluid TMs bilaterally. Nose:  mild erythema. sinuses negative. Mouth:  Oral mucosa and oropharynx without lesions or exudates.  Teeth in good repair. Minimal erythema Lungs:  normal respiratory effort.   Heart:  Normal rate and regular rhythm. S1 and S2 normal without gallop, 1/VI SEM Extremities:  No clubbing, cyanosis, edema, or deformity noted with normal full range of motion of all joints.   Psych:  flat affect, converses easily. No acute distress.    Impression & Recommendations:  Problem # 1:  VIRAL URI (ICD-465.9) Assessment New Continue supportive care.  Advised to let us know if symptoms do not improve in 5-7 days.  See patient instructions for details. Her updated medication list for this problem includes:  Xyzal 5 Mg Tabs (Levocetirizine dihydrochloride) .Marland Kitchen... 1 by mouth daily  Complete Medication List: 1)  Zolpidem Tartrate 10 Mg Tabs (Zolpidem tartrate) .... Prn 2)  Xyzal 5 Mg Tabs (Levocetirizine dihydrochloride) .Marland Kitchen.. 1 by mouth daily 3)  Aciphex 20 Mg Tbec (Rabeprazole sodium) .Marland Kitchen.. 1 by mouth once daily prn 4)  Gabapentin 600 Mg Caps (gabapentin)  .Marland Kitchen.. 1 by mouth three times a day 5)  Calcium 600 600 Mg Tabs (Calcium carbonate) .Marland Kitchen.. 1 by mouth two times a day 6)  Citalopram Hydrobromide 40 Mg Tabs (Citalopram hydrobromide) .... Take one tablet by mouth daily 7)  Viamin D 1000  .... Daily 8)  Glucosamine 500 Mg Caps  (Glucosamine sulfate) .... Take 2 tablets 2 times dailly 9)  Morphine Sulfate 15 Mg Tabs (Morphine sulfate) .... Three times daily 10)  Melatonin 3 Mg Tabs (Melatonin) .... At bedtime 11)  Peri-colace 8.6-50 Mg Tabs (Sennosides-docusate sodium) .... 3 times a day 12)  Magnesium Glycinate Plus 110 Mg Caps (Magnesium) .... 2 times dailly 13)  Fish Oil 500 Mg Caps (Omega-3 fatty acids) .... 3 times daily 14)  Mirtazapine 15 Mg Tabs (Mirtazapine) .Marland Kitchen.. 1 by mouth every night  Patient Instructions: 1)  Recommended voice rest for laryngitis, Warm honey tea.  Treat sympotmatically  with guafenesin, nasal saline irrigation. Call if not improving aas expected in 7-10 days as on viral timeline.   Current Allergies (reviewed today): ! * CYMBALTA ! DOXYCYCLINE ! FLEXERIL ! CRESTOR (ROSUVASTATIN CALCIUM) ! * TRAMADOL ! * OXYCODONE ! * OXYCONTIN ! EFFEXOR PCN * ALEVE BIAXIN FLAGYL IMITREX * MAXALT

## 2010-07-29 NOTE — Letter (Signed)
Summary: Primary Care Consult Scheduled Letter  [facility] [place]  [address] [place], [place] [address]   Phone: [phone]  Fax: [phone]      12/19/2007 MRN: [id number]  [facility] [address] [place], [place]  [address]    Dear [name],      We have scheduled an appointment for you.  At the recommendation of Dr.Hopper, we have scheduled you a Doppler Test on June 30th at 8:30am.  Their address is [address] [place] [address] The office phone number is [phone].  If this appointment day and time is not convenient for you, please feel free to call the office of the doctor you are being referred to at the number listed above and reschedule the appointment.     It is important for you to keep your scheduled appointments. We are here to make sure you are given good patient care. If you have questions or you have made changes to your appointment, please notify us at  [phone], ask for Tiffany.    Thank you,  Patient Care Coordinator [place] at [facility]

## 2010-07-29 NOTE — Assessment & Plan Note (Signed)
Summary: review lab/cbs   Vital Signs:  Patient Profile:   60 Years Old Female Weight:      157.50 pounds Pulse rate:   60 / minute Pulse rhythm:   regular Resp:     15 per minute BP sitting:   110 / 60  (left arm) Cuff size:   large  Vitals Entered By: Wendall Stade (May 29, 2007 1:25 PM)                 Chief Complaint:  review labs.  History of Present Illness: No specific diet; in water aerobics & PT exercises 2X/week & once daily @ home w/o C-P sx. NMR 05/10/07 revealed LDL 154 with 2242 total & 2778 small dense particles for > 20% risk; mild dietary contribution. LDL goal=< 100; ideal =< 75.  Hypertension History:      Positive major cardiovascular risk factors include female age 34 years old or older, hyperlipidemia, and hypertension.  Negative major cardiovascular risk factors include no history of diabetes, negative family history for ischemic heart disease, and non-tobacco-user status.        Further assessment for target organ damage reveals no history of ASHD, stroke/TIA, or peripheral vascular disease.    Lipid Management History:      Positive NCEP/ATP III risk factors include female age 66 years old or older, early menopause without estrogen hormone replacement, and hypertension.  Negative NCEP/ATP III risk factors include non-diabetic, no family history for ischemic heart disease, non-tobacco-user status, no ASHD (atherosclerotic heart disease), no prior stroke/TIA, no peripheral vascular disease, and no history of aortic aneurysm.      Current Allergies: ! * CYMBALTA PCN EFFEXOR * ALEVE BIAXIN FLAGYL IMITREX * MAXALT     Review of Systems  GI      Denies abdominal pain, bloody stools, change in bowel habits, constipation, dark tarry stools, diarrhea, indigestion, nausea, and vomiting.      GI sx resolved post D/C of B12 supplements   Physical Exam  General:     Well-developed,well-nourished,in no acute distress; alert,appropriate and  cooperative throughout examination;overweight-appearing.   Heart:     normal rate, regular rhythm, no gallop, no rub, no JVD, no HJR, and grade1  /6 systolic murmur.   Abdomen:     Bowel sounds positive,abdomen soft and non-tender without masses, organomegaly or hernias noted. Pulses:     R and L carotid,radial,dorsalis pedis and posterior tibial pulses are full and equal bilaterally    Impression & Recommendations:  Problem # 1:  HYPERLIPIDEMIA (ICD-272.2)  Complete Medication List: 1)  Tylenol  .... Prn 2)  Vitamin D3  .Marland Kitchen.. 50,000iu qweek 3)  Midrin 325-65-100 Mg Caps (Apap-isometheptene-dichloral) .... As needed migraines 4)  Lortab 5 5-500 Mg Tabs (Hydrocodone-acetaminophen) .... Prn 5)  Zolpidem Tartrate 10 Mg Tabs (Zolpidem tartrate) .... Prn 6)  Xyzal 5 Mg Tabs (Levocetirizine dihydrochloride) .... 1/2 tab qd 7)  Skelaxin 800 Mg Tabs (Metaxalone) .Marland Kitchen.. 1 by mouth qhs  Hypertension Assessment/Plan:      The patient's hypertensive risk group is category B: At least one risk factor (excluding diabetes) with no target organ damage.  Today's blood pressure is 110/60.    Lipid Assessment/Plan:      Based on NCEP/ATP III, the patient's risk factor category is "2 or more risk factors and a calculated 10 year CAD risk of < 20%".  From this information, the patient's calculated lipid goals are as follows: Total cholesterol goal is 200; LDL cholesterol goal is  130; HDL cholesterol goal is 40; Triglyceride goal is 150.     Patient Instructions: 1)  Drwillett's book Eat, Drink, & Be Healthy is best info on cholesterol.Consider Crestor 10 mg M,W, & F with labs in 10 weeks. 2)  Please schedule a follow-up appointment in 3 months. 3)  Hepatic Panel prior to visit, ICD-9:  995.20 4)  Lipid Panel prior to visit, ICD-9: 272.4    ]
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
## 2010-07-29 NOTE — Assessment & Plan Note (Signed)
   Vital Signs:  Patient Profile:   60 Years Old Female Weight:      183 pounds Temp:     98.3 degrees F oral Pulse rate:   72 / minute Pulse rhythm:   regular BP sitting:   128 / 60  (left arm) Cuff size:   large  Vitals Entered By: Astrid Drafts (Nov 07, 2006 11:44 AM)               Chief Complaint:  cough for three weeks and right facial pain.
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
## 2010-07-29 NOTE — Progress Notes (Signed)
Summary: Rx Mirtazapine  Phone Note Refill Request Call back at [phone] Message from:  [facility] on October 19, 2009 11:13 AM  Refills Requested: Medication #1:  MIRTAZAPINE 15 MG TABS 1 by mouth every night   Last Refilled: 07/31/2009 Received faxed refill request please advise.   Method Requested: Telephone to Pharmacy Initial call taken by: Linde Gillis CMA Duncan Dull),  October 19, 2009 11:13 AM    Prescriptions: MIRTAZAPINE 15 MG TABS (MIRTAZAPINE) 1 by mouth every night  #30 x 11   Entered and Authorized by:   Hannah Beat MD   Signed by:   Hannah Beat MD on 10/19/2009   Method used:   Electronically to        [facility] (retail)       [address]       [place], [place]  [address]       Ph: [phone] or [phone]       Fax: [phone]   RxID:   [id number]

## 2010-07-29 NOTE — Progress Notes (Signed)
Summary: refill request for xyzal  Phone Note Refill Request Message from:  Fax from Pharmacy  Refills Requested: Medication #1:  XYZAL 5 MG  TABS 1 by mouth daily Faxed form from [facility] is on your desk.  Initial call taken by: Lowella Petties CMA,  October 15, 2009 3:46 PM  Follow-up for Phone Call        can you fix, i will sign Follow-up by: Hannah Beat MD,  October 15, 2009 3:54 PM  Additional Follow-up for Phone Call Additional follow up Details #1::        [facility] Additional Follow-up by: Benny Lennert CMA Duncan Dull),  October 15, 2009 4:02 PM    Prescriptions: XYZAL 5 MG  TABS (LEVOCETIRIZINE DIHYDROCHLORIDE) 1 by mouth daily  #90 x 3   Entered by:   Benny Lennert CMA (AAMA)   Authorized by:   Hannah Beat MD   Signed by:   Benny Lennert CMA (AAMA) on 10/15/2009   Method used:   [facility]ed to ...       [facility]       [address]       [place], [place]  [address]       Ph: [phone]       Fax: [phone]   RxID:   [id number] XYZAL 5 MG  TABS (LEVOCETIRIZINE DIHYDROCHLORIDE) 1 by mouth daily  #90 x 3   Entered and Authorized by:   Hannah Beat MD   Signed by:   Hannah Beat MD on 10/15/2009   Method used:   [facility]        [facility]       [address]       [place], [place]  [address]       Ph: [phone]       Fax: [phone]   RxID:   [id number]

## 2010-07-29 NOTE — Letter (Signed)
Summary: [facility]  [facility]   Imported By: Lanelle Bal 08/15/2009 11:23:09  _____________________________________________________________________  External Attachment:    Type:   Image     Comment:   External Document
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
## 2010-07-29 NOTE — Procedures (Signed)
Summary: Gastroenterology colon  Gastroenterology colon   Imported By: Donneta Romberg 09/29/2007 13:48:57  _____________________________________________________________________  External Attachment:    Type:   Image     Comment:   External Document

## 2010-07-29 NOTE — Progress Notes (Signed)
Summary: Want med  Phone Note Call from Patient Call back at Home Phone [phone]   Caller: Patient Call For: Dr. Dayton Martes Summary of Call: pt states she was seen 06/04/2009 and was told that if she was not better to call back and ask for a stronger abx, pt states she is now coughing up phlem. Initial call taken by: Mervin Hack CMA Duncan Dull),  July 02, 2009 5:12 PM  Follow-up for Phone Call        [facility] [address]. Follow-up by: Ruthe Mannan MD,  July 02, 2009 8:03 PM    New/Updated Medications: AZITHROMYCIN 250 MG  TABS (AZITHROMYCIN) 2 by  mouth today and then 1 daily for 4 days Prescriptions: AZITHROMYCIN 250 MG  TABS (AZITHROMYCIN) 2 by  mouth today and then 1 daily for 4 days  #6 x 0   Entered and Authorized by:   Ruthe Mannan MD   Signed by:   Ruthe Mannan MD on 07/02/2009   Method used:   [facility]        [facility]       [address]       [place], [place]  [address]       Ph: [phone]       Fax: [phone]   RxID:   [id number]   Appended Document: Want med Patient Advised.

## 2010-08-04 ENCOUNTER — Encounter: Payer: 59 | Attending: Physical Medicine & Rehabilitation

## 2010-08-04 ENCOUNTER — Ambulatory Visit (HOSPITAL_BASED_OUTPATIENT_CLINIC_OR_DEPARTMENT_OTHER): Payer: 59

## 2010-08-04 DIAGNOSIS — M961 Postlaminectomy syndrome, not elsewhere classified: Secondary | ICD-10-CM | POA: Insufficient documentation

## 2010-08-04 DIAGNOSIS — IMO0001 Reserved for inherently not codable concepts without codable children: Secondary | ICD-10-CM

## 2010-08-04 DIAGNOSIS — M542 Cervicalgia: Secondary | ICD-10-CM | POA: Insufficient documentation

## 2010-08-04 DIAGNOSIS — IMO0002 Reserved for concepts with insufficient information to code with codable children: Secondary | ICD-10-CM

## 2010-08-04 DIAGNOSIS — M5380 Other specified dorsopathies, site unspecified: Secondary | ICD-10-CM

## 2010-08-18 ENCOUNTER — Encounter: Payer: Self-pay | Admitting: Gastroenterology

## 2010-08-18 ENCOUNTER — Ambulatory Visit (INDEPENDENT_AMBULATORY_CARE_PROVIDER_SITE_OTHER): Payer: 59 | Admitting: Gastroenterology

## 2010-08-18 DIAGNOSIS — F1120 Opioid dependence, uncomplicated: Secondary | ICD-10-CM

## 2010-08-18 DIAGNOSIS — R1310 Dysphagia, unspecified: Secondary | ICD-10-CM
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
## 2010-08-24 NOTE — Letter (Signed)
Summary: EGD Instructions  [facility]  [address] [place], [place] [address]   Phone: [phone]  Fax: [phone]       [name]    1950/07/25    MRN: [id number]       Procedure Day /Date:MONDAY 09/13/2010     Arrival Time: 8AM     Procedure Time:9AM     Location of Procedure:                    X  [facility] (4th Floor) _  PREPARATION FOR ENDOSCOPY/PROPOFOL   On 09/13/2010  THE DAY OF THE PROCEDURE:  1.   No solid foods, milk or milk products are allowed after midnight the night before your procedure.  2.   Do not drink anything colored red or purple.  Avoid juices with pulp.  No orange juice.  3.  You may drink clear liquids until7AM , which is 2 hours before your procedure.                                                                                                CLEAR LIQUIDS INCLUDE: Water Jello Ice Popsicles Tea (sugar ok, no milk/cream) Powdered fruit flavored drinks Coffee (sugar ok, no milk/cream) Gatorade Juice: apple, white grape, white cranberry  Lemonade Clear bullion, consomm, broth Carbonated beverages (any kind) Strained chicken noodle soup Hard Candy   MEDICATION INSTRUCTIONS  Unless otherwise instructed, you should take regular prescription medications with a small sip of water as early as possible the morning of your procedure.           OTHER INSTRUCTIONS  You will need a responsible adult at least 59 years of age to accompany you and drive you home.   This person must remain in the waiting room during your procedure.  Wear loose fitting clothing that is easily removed.  Leave jewelry and other valuables at home.  However, you may wish to bring a book to read or an iPod/MP3 player to listen to music as you wait for your procedure to start.  Remove all body piercing jewelry and leave at home.  Total time from sign-in until discharge is approximately 2-3 hours.  You should go home directly  after your procedure and rest.  You can resume normal activities the day after your procedure.  The day of your procedure you should not:   Drive   Make legal decisions   Operate machinery   Drink alcohol   Return to work  You will receive specific instructions about eating, activities and medications before you leave.    The above instructions have been reviewed and explained to me by   _______________________    I fully understand and can verbalize these instructions _____________________________ Date _________

## 2010-09-03 ENCOUNTER — Encounter: Payer: 59 | Attending: Physical Medicine & Rehabilitation

## 2010-09-03 ENCOUNTER — Ambulatory Visit (HOSPITAL_BASED_OUTPATIENT_CLINIC_OR_DEPARTMENT_OTHER): Payer: 59 | Admitting: Physical Medicine & Rehabilitation

## 2010-09-03 DIAGNOSIS — M961 Postlaminectomy syndrome, not elsewhere classified: Secondary | ICD-10-CM | POA: Insufficient documentation

## 2010-09-03 DIAGNOSIS — M531 Cervicobrachial syndrome: Secondary | ICD-10-CM

## 2010-09-03 DIAGNOSIS — M542 Cervicalgia: Secondary | ICD-10-CM | POA: Insufficient documentation

## 2010-09-12 LAB — BASIC METABOLIC PANEL
BUN: 13 mg/dL (ref 6–23)
CO2: 28 mEq/L (ref 19–32)
Calcium: 8.9 mg/dL (ref 8.4–10.5)
Chloride: 102 mEq/L (ref 96–112)
Creatinine, Ser: 1.04 mg/dL (ref 0.4–1.2)
GFR calc Af Amer: >60 mL/min (ref >60)
GFR calc non Af Amer: 55 mL/min — ABNORMAL LOW (ref >60)
Glucose, Bld: 155 mg/dL — ABNORMAL HIGH (ref 70–99)
Potassium: 3.9 mEq/L (ref 3.5–5.1)
Sodium: 137 mEq/L (ref 135–145)

## 2010-09-12 LAB — DIFFERENTIAL
Basophils Absolute: 0 10*3/uL (ref 0.0–0.1)
Basophils Relative: 0 % (ref 0–1)
Eosinophils Absolute: 0.2 10*3/uL (ref 0.0–0.7)
Eosinophils Relative: 3 % (ref 0–5)
Lymphocytes Relative: 37 % (ref 12–46)
Lymphs Abs: 2.4 10*3/uL (ref 0.7–4.0)
Monocytes Absolute: 0.5 10*3/uL (ref 0.1–1.0)
Monocytes Relative: 7 % (ref 3–12)
Neutro Abs: 3.5 10*3/uL (ref 1.7–7.7)
Neutrophils Relative %: 53 % (ref 43–77)

## 2010-09-12 LAB — CBC
HCT: 34.2 % — ABNORMAL LOW (ref 36.0–46.0)
Hemoglobin: 12.2 g/dL (ref 12.0–15.0)
MCHC: 35.6 g/dL (ref 30.0–36.0)
MCV: 91.8 fL (ref 78.0–100.0)
Platelets: 268 10*3/uL (ref 150–400)
RBC: 3.73 MIL/uL — ABNORMAL LOW (ref 3.87–5.11)
RDW: 12.5 % (ref 11.5–15.5)
WBC: 6.6 10*3/uL (ref 4.0–10.5)

## 2010-09-12 LAB — POCT CARDIAC MARKERS
CKMB, poc: 1.1 ng/mL (ref 1.0–8.0)
Myoglobin, poc: 60.7 ng/mL (ref 12–200)
Troponin i, poc: <0.05 ng/mL (ref 0.00–0.09)

## 2010-09-12 LAB — D-DIMER, QUANTITATIVE: D-Dimer, Quant: 0.28 ug/mL-FEU (ref 0.00–0.48)

## 2010-09-13 ENCOUNTER — Encounter: Payer: Self-pay | Admitting: Gastroenterology

## 2010-09-13 ENCOUNTER — Encounter (AMBULATORY_SURGERY_CENTER): Payer: 59 | Admitting: Gastroenterology

## 2010-09-13 ENCOUNTER — Other Ambulatory Visit: Payer: Self-pay | Admitting: Gastroenterology

## 2010-09-13 DIAGNOSIS — K222 Esophageal obstruction: Secondary | ICD-10-CM

## 2010-09-13 DIAGNOSIS — K319 Disease of stomach and duodenum, unspecified: Secondary | ICD-10-CM

## 2010-09-13 DIAGNOSIS — K2990 Gastroduodenitis, unspecified, without bleeding: Secondary | ICD-10-CM

## 2010-09-13 DIAGNOSIS — R1310 Dysphagia, unspecified: Secondary | ICD-10-CM

## 2010-09-13 DIAGNOSIS — K2970 Gastritis, unspecified, without bleeding: Secondary | ICD-10-CM

## 2010-09-13 DIAGNOSIS — R079 Chest pain, unspecified: Secondary | ICD-10-CM

## 2010-09-18 ENCOUNTER — Encounter: Payer: Self-pay | Admitting: Gastroenterology

## 2010-09-23 NOTE — Letter (Signed)
Summary: Appt Reminder 2  [facility]  [address] [place], [place] [address]   Phone: [phone]  Fax: [phone]        September 13, 2010 MRN: [id number]    [facility] [address] [place], [place]  [address]    Dear Ms. Reardon,   You have a return appointment with Dr. Arlyce Dice on 10/27/10 at 2:15pm.  Please remember to bring a complete list of the medicines you are taking, your insurance card and your co-pay.  If you have to cancel or reschedule this appointment, please call before 5:00 pm the evening before to avoid a cancellation fee.  If you have any questions or concerns, please call [phone].    Sincerely,    Selinda Michaels RN  Appended Document: Appt Reminder 2 Letter is mailed to the patient's home address

## 2010-09-23 NOTE — Procedures (Addendum)
Summary: Upper Endoscopy  Patient: [name] Note: All result statuses are Final unless otherwise noted.  Tests: (1) Upper Endoscopy (EGD)   EGD Upper Endoscopy       DONE     [facility]     [address]     [place], [place]  [address]          ENDOSCOPY PROCEDURE REPORT          PATIENT:  [name], [name]  MR#:  [id number]     BIRTHDATE:  09-13-1950, 59 yrs. old  GENDER:  female          ENDOSCOPIST:  Barbette Hair. Arlyce Dice, MD     Referred by:  Elpidio Galea. Copland, M.D.          PROCEDURE DATE:  09/13/2010     PROCEDURE:  EGD with biopsy, [address]39, Maloney Dilation of Esophagus     ASA CLASS:  Class II     INDICATIONS:  chest pain, dysphagia          MEDICATIONS:   MAC sedation, administered by CRNA Propofol 150mg      IV     TOPICAL ANESTHETIC:          DESCRIPTION OF PROCEDURE:   After the risks benefits and     alternatives of the procedure were thoroughly explained, informed     consent was obtained.  The LB GIF-H180 [id number] endoscope was     introduced through the mouth and advanced to the third portion of     the duodenum, without limitations.  The instrument was slowly     withdrawn as the mucosa was fully examined.     <<PROCEDUREIMAGES>>          Mild gastritis was found in the antrum. Mild erythema with few     superficial erosions. Bxs taken (see image3).  A stricture was     found at the gastroesophageal junction. Early stricture Dilation     with maloney dilator 18mm Minimal resistance; no heme  Otherwise     the examination was normal (see image1, image2, image5, and     image6).    Retroflexed views revealed no abnormalities.    The     scope was then withdrawn from the patient and the procedure     completed.          COMPLICATIONS:  None          ENDOSCOPIC IMPRESSION:     1) Mild gastritis in the antrum     2) EarlyStricture at the gastroesophageal junction - s/p Maloney     dilitation     3) Otherwise normal examination  RECOMMENDATIONS:     1) continue PPI     2) Call office next 2-3 days to schedule an office appointment     for 1 month          REPEAT EXAM:  No          ______________________________     Barbette Hair. Arlyce Dice, MD          CC:          n.     eSIGNED:   Barbette Hair. Maron Stanzione at 09/13/2010 09:25 AM          [name], [id number]  Note: An exclamation mark (!) indicates a result that was not dispersed into the flowsheet. Document Creation Date: 09/13/2010 9:26 AM _______________________________________________________________________  (1) Order result status: Final Collection or observation date-time: 09/13/2010  09:19 Requested date-time:  Receipt date-time:  Reported date-time:  Referring Physician:   Ordering Physician: Melvia Heaps [phone]) Specimen Source:  Source: [name] Order Number: [id number] Lab site:

## 2010-10-04 ENCOUNTER — Other Ambulatory Visit: Payer: Self-pay | Admitting: *Deleted

## 2010-10-04 MED ORDER — CITALOPRAM HYDROBROMIDE 40 MG PO TABS: 40.0000 mg | ORAL_TABLET | Freq: Every day | ORAL | Status: DC

## 2010-10-06 ENCOUNTER — Encounter: Payer: 59 | Attending: Physical Medicine & Rehabilitation

## 2010-10-06 DIAGNOSIS — IMO0002 Reserved for concepts with insufficient information to code with codable children: Secondary | ICD-10-CM

## 2010-10-06 DIAGNOSIS — IMO0001 Reserved for inherently not codable concepts without codable children: Secondary | ICD-10-CM

## 2010-10-06 DIAGNOSIS — M542 Cervicalgia: Secondary | ICD-10-CM | POA: Insufficient documentation

## 2010-10-06 DIAGNOSIS — M5380 Other specified dorsopathies, site unspecified: Secondary | ICD-10-CM

## 2010-10-06 DIAGNOSIS — M961 Postlaminectomy syndrome, not elsewhere classified: Secondary | ICD-10-CM | POA: Insufficient documentation

## 2010-10-06 DIAGNOSIS — Z981 Arthrodesis status: Secondary | ICD-10-CM | POA: Insufficient documentation

## 2010-10-15 ENCOUNTER — Other Ambulatory Visit: Payer: Self-pay | Admitting: Family Medicine

## 2010-10-27 ENCOUNTER — Encounter: Payer: Self-pay | Admitting: Gastroenterology

## 2010-10-27 ENCOUNTER — Ambulatory Visit (INDEPENDENT_AMBULATORY_CARE_PROVIDER_SITE_OTHER): Payer: 59 | Admitting: Gastroenterology

## 2010-10-27 DIAGNOSIS — R1312 Dysphagia, oropharyngeal phase: Secondary | ICD-10-CM

## 2010-10-27 NOTE — Progress Notes (Signed)
[name]  has returned following upper endoscopy with dilatation of an early distal esophageal stricture. She is feeling well and has no further complaints of dysphagia. She has minimal pyrosis for which she is taking dexilant.

## 2010-10-27 NOTE — Assessment & Plan Note (Signed)
Significantly improved following dilatation of the distal esophagus, stricture. Plan repeat dilatation when necessary

## 2010-10-27 NOTE — Patient Instructions (Addendum)
Alicia Hampshire Copeland,MD Call back as needed

## 2010-11-03 ENCOUNTER — Encounter: Payer: Medicare Other | Attending: Physical Medicine & Rehabilitation

## 2010-11-03 DIAGNOSIS — M961 Postlaminectomy syndrome, not elsewhere classified: Secondary | ICD-10-CM | POA: Insufficient documentation

## 2010-11-03 DIAGNOSIS — M542 Cervicalgia: Secondary | ICD-10-CM | POA: Insufficient documentation

## 2010-11-03 DIAGNOSIS — IMO0001 Reserved for inherently not codable concepts without codable children: Secondary | ICD-10-CM

## 2010-11-03 DIAGNOSIS — M5380 Other specified dorsopathies, site unspecified: Secondary | ICD-10-CM

## 2010-11-03 DIAGNOSIS — IMO0002 Reserved for concepts with insufficient information to code with codable children: Secondary | ICD-10-CM

## 2010-11-03 DIAGNOSIS — Z981 Arthrodesis status: Secondary | ICD-10-CM | POA: Insufficient documentation
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
## 2010-11-09 NOTE — Assessment & Plan Note (Signed)
[facility]                         GASTROENTEROLOGY OFFICE NOTE   ORLEAN, HOLTROP                       MRN:          [id number]  DATE:12/18/2006                            DOB:          12/20/1950    PROBLEM:  Nausea and diarrhea.   [name] has returned for reevaluation.  She continues with nausea.  Nausea is throughout the day, though is especially severe in the  mornings.  It may be slightly alleviated by eating.  She also has  multiple loose stools daily.  Her only medications currently are Zyrtec  and Prilosec.  Symptoms were not changed with Prevacid or AcipHex.  She  has lost 70 pounds in the last 6 months.  TSH in April was normal on  exam.  Gastric emptying scan was unremarkable.   PHYSICAL EXAMINATION:  Pulse 72, blood pressure 140/72, weight 174.  HEENT:  EOMI.  PERRLA.  Sclerae are anicteric.  Conjunctivae are pink.  NECK:  Supple without thyromegaly, adenopathy or carotid bruits.  CHEST:  Clear to auscultation and percussion without adventitious  sounds.  CARDIAC:  Regular rhythm; normal S1, S2.  There are no murmurs, gallops  or rubs.  ABDOMEN:  There is no succussion splash.  Bowel sounds are normoactive.  Abdomen is soft, non-tender and non-distended.  There are no abdominal  masses, tenderness, splenic enlargement or hepatomegaly.  EXTREMITIES:  Full range of motion.  No cyanosis, clubbing or edema.  RECTAL:  Deferred.   IMPRESSION:  Persistent nausea with diarrhea and weight loss.  I am  still suspicious this may be a medication effect.  Both proton pump  inhibitors and Zyrtec can cause these symptoms.   RECOMMENDATION:  1. Hold all medicines (except potassium).  2. Compazine p.r.n.  3. Proceed with upper endoscopy if symptoms have not improved off      medicines.  4. Finally, I would consider CT of the abdomen and pelvis if the above      measures are unsuccessful and symptoms continue.     Barbette Hair. Arlyce Dice,  MD,FACG  Electronically Signed    RDK/MedQ  DD: 12/18/2006  DT: 12/19/2006  Job #: [id number]   cc:   Willow Ora, MD

## 2010-11-09 NOTE — Assessment & Plan Note (Signed)
[facility]                         GASTROENTEROLOGY OFFICE NOTE   Alicia, Ryan                       MRN:          [id number]  DATE:02/05/2007                            DOB:          28-Aug-1950    PROBLEM:  Nausea and diarrhea.   [name] has returned for scheduled GI followup.  Since holding her  B12 injections, her nausea and diarrhea have subsided.  She has stopped  all her medications.  She had been taking B12 injections several times a  week.  Upper endoscopy on January 04, 2007 was entirely normal.  Colonoscopy, October 20, 2006, was pertinent for melanosis coli.   PHYSICAL EXAMINATION:  Pulse 76, blood pressure 110/66, weight 173.   IMPRESSION:  Nausea and diarrhea.  I believe this is due to a medication  effect.  She was taking very high doses of B12 injections that certainly  could be operative.   RECOMMENDATIONS:  No further gastrointestinal workup.  Her B12 therapy  will be under the direction of Dr. Drue Novel, and likely will be given monthly  only.     Barbette Hair. Arlyce Dice, MD,FACG  Electronically Signed    RDK/MedQ  DD: 02/05/2007  DT: 02/06/2007  Job #: [id number]   cc:   Alicia Ora, MD

## 2010-11-09 NOTE — Assessment & Plan Note (Signed)
[facility]                         GASTROENTEROLOGY OFFICE NOTE   Alicia, Ryan                       MRN:          [id number]  DATE:10/31/2006                            DOB:          [birth year]    PROBLEM:  Nausea.   [name] has returned for a scheduled GI visit.  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  Since stopping her trazodone and  Nexium, she reports improvement in her diarrhea.  It is now mild, and  only intermittent.  She has occasional bouts of mild nausea.  The  thyroid tests including THS and T4 were normal.  A gastric emptying scan  was also unremarkable.  She was told by her ENT physician that she has  to take Prilosec because of allergies.  Presumably, he felt that she is  suffering from acid reflux.   EXAMINATION:  Pulse 76.  Blood pressure 118/70.  Weight 181.   IMPRESSION:  Nausea and diarrhea.  I am suspicious this is a medication  effect.  Proton pump inhibitors may be contributing to both.   RECOMMENDATIONS:  1. I have given [name] samples of Aciphex and Prevacid in case her      high-dose Prilosec exacerbates her symptoms of nausea and diarrhea.  2. Imodium 2 tablets q.a.m. and then nightly as needed.     Barbette Hair. Arlyce Dice, MD,FACG  Electronically Signed    RDK/MedQ  DD: 10/31/2006  DT: 10/31/2006  Job #: [id number]   cc:   Willow Ora, MD

## 2010-11-09 NOTE — Assessment & Plan Note (Signed)
A 60 year old female diagnosed with fibromyalgia 20 years ago, history  of spine surgery most recently on July 24, 2007 with the C5-C6 ACDF  performed by Dr. Jeral Fruit at that time.   She has right shoulder pain with rotator cuff tendinopathy and bursitis.  Dr. August Saucer has been treating this.   She has degenerative disk disease thoracic spine T8-T9, T9-T10, and T11-  T12, has responded to epidural steroid injections at the T8-T9 level in  the past in March 2009.   She has trialed physical therapy as well as chiropractic.   I sent her to aquatic therapy last visit.  She is enjoying this, but  only does it once a week because of co-pay issues.   CURRENT PAIN MEDICATIONS:  1. OxyContin 20 mg b.i.d.  2. Percocet 5/325 one to three tablets per day.  She has 16 of the      OxyContin left and 36 of the Percocet left.   Her Oswestry disability index today is 56%.   REVIEW OF SYSTEMS:  Notable for numbness and tingling in the fingers,  trouble walking, spasms, depression, weight gain, sweat, skin rash,  nausea, constipation, gastritis, and abdominal pain.   Current work status, Nurse, children's, own business, but only works  couple hours a week due to her inability to sit per her report.  She has  some difficulties with meal prep and household duties.  She does drive.   PAST MEDICAL HISTORY:  Arthritis and hyperlipidemia.   ALLERGIES:  Multiple including B12 SHOTS, TRIPTANS, PENICILLIN, ALEVE,  CYCLOBENZAPRINE, BIAXIN, FLAGYL, and CYMBALTA.   PHYSICAL EXAMINATION:  VITAL SIGNS:  Blood pressure 151/55, pulse 82,  respirations 16, and O2 sat 96% on room air.  GENERAL:  In no acute distress.  Orientation x3.  Affect alert.  Gait is  normal.  EXTREMITIES:  She has tenderness to palpation bilateral upper traps,  bilateral upper medial scapular border, and bilateral sternocostal area.  She has some mild tenderness in the thoracic paraspinals.  Her main pain  is when she tries to either flex  forward or flex backward or does  twisting.  Her main pain is around the T10 level.   Her motor strength is 5/5 bilateral upper and lower extremities.  Deep  tendon reflexes are normal.   IMPRESSION:  1. Thoracic spondylosis with thoracic degenerative disk.  Multimodal      treatment recommended.  She is starting her aquatic exercise.  She      will get her thoracic epidural next week per Dr. Stevphen Rochester.  I think,      she could benefit from the oxycodone for this, but need to balance      this against her treatment of fibromyalgia, in which case around-      the-clock opioids is not advisable.  We will alter her medication      regimen so that her oxycodone is just be short-acting variety 15 mg      t.i.d.  2. Initiate tramadol 50 mg b.i.d.  3. I have gone over opioid consent form.  She has signed up on this      going over her treatment options as well as      potential side effects.  We have discussed the plan.  She is in      agreement.  I will see her back in 1 month.  She will see Dr.      Stevphen Rochester for the epidural in 1 week.  Erick Colace, M.D.  Electronically Signed     AEK/MedQ  D:  04/22/2008 11:45:54  T:  04/23/2008 01:24:42  Job #:  [id number]   cc:   Titus Dubin. Alwyn Ren, MD,FACP,FCCP  [phone] [place]  [place] [address]   G. Dorene Grebe, M.D.  Fax: [phone]   Hilda Lias, M.D.  Fax: [phone]

## 2010-11-09 NOTE — Assessment & Plan Note (Signed)
A 60 year old female with history of right-sided neck pain.  She has had  good response with cervical medial branch blocks C3, C4, C5 on 2  occasions and most recently had radiofrequency ablation per Dr. Stevphen Rochester  on the right side at the C5 and C4 levels.  She feels like she has some  residual pain a little lower down her neck as well as the upper thoracic  area.  She has had no new medical problems in the interval time period  with the exception of an ultrasound of the abdomen demonstrating fatty  liver after her liver function tests were elevated.  Looking at her  medications prescribed, she is using hydrocodone, but also takes an  extra Tylenol with that so that her total dosage is about 650 t.i.d.  She has been told to not take the Voltaren gel, however, this only has  about a 5% systemic uptake.  Her pain is about 5/10 on average currently  3.  Pain is described as sharp, burning, constant aching, interferes  with activity at a 5/10 level.  This is generally better than the 6/10  level that she experienced before the radiofrequency procedure at the C4  and C5 levels.  At that point, it was in the 6-7 range.   She can walk 10-15 minutes at a time.  She is doing this more often now.  She climbs steps, but carefully.  She rarely drives.  She needs assist  with meal prep, household duties, and shopping.   Her review of systems is positive for weakness, numbness, tremor,  tingling, depression, weight gain.   PAST MEDICAL HISTORY:  Heart disease, fibromyalgia.   SOCIAL HISTORY:  Married, lives with her husband.   FAMILY HISTORY:  Heart disease, lung disease, diabetes, high blood  pressure, and Alzheimer's disease.   PHYSICAL EXAMINATION:  VITAL SIGNS:  Blood pressure 131/53, pulse 65,  respirations 18, O2 sat 96% on room air.  GENERAL:  No acute distress.  Orientation x3.  Affect is alert.  Gait is  normal.  Her neck range of motion is about 75% range and forward  flexion, extension,  lateral rotation, and bending.  EXTREMITIES:  Her upper extremity strength is full.  Her upper extremity  sensation and range of motion are normal.  She has normal deep tendon  reflexes bilateral upper extremities.  She has some tenderness over the  right side of her neck starting around C3 and going down towards C7, T1  area.   IMPRESSION:  1. 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  2. History of fibromyalgia syndrome.  There may be some overlap pain      syndrome.   PLAN:  1. We will send her back to Dr. Stevphen Rochester.  It appears that she still has      some residual facet mediated pain at the C6 and C7 levels based on      examination and history.  2. In terms of her current pain medications, I want to limit her      acetaminophen given her elevated LFTs as well as fatty liver      disease and we will go ahead and switch her from hydrocodone to MS      immediate release morphine      sulfate immediate release rather 50 mg t.i.d.  I discussed with the      patient agrees with plan.  She will see me in 1 month and Dr.  Hansen in 6 weeks.      Erick Colace, M.D.  Electronically Signed     AEK/MedQ  D:  02/16/2009 13:06:35  T:  02/17/2009 05:11:10  Job #:  [id number]   cc:   Celene Kras, MD  Fax: [phone]   Hilda Lias, M.D.  Fax: [phone]

## 2010-11-09 NOTE — Procedures (Signed)
NAMESULAY, BRYMER                ACCOUNT NO.:  [id number]   MEDICAL RECORD NO.:  [id number]           PATIENT TYPE:   LOCATION:                                 FACILITY:   PHYSICIAN:  Erick Colace, M.D.DATE OF BIRTH:  1951/02/18   DATE OF PROCEDURE:  DATE OF DISCHARGE:                               OPERATIVE REPORT   This is a right levator scapula trigger point injection.   INDICATION:  Myofascial pain.  Pain is only partially responsive to  medication management as well as physical therapy and interferes with  activity, particularly overhead activity.   Informed consent was obtained after describing risks and benefits of the  procedure to the patient.  These include bleeding, bruising, infection.  She elects to proceed and has given written consent.  The patient was  seated.  Area marked and prepped with Betadine per medial scapular  border 2 spots, entered with 25-gauge inch and a half needle, 1 mL of  lidocaine 1% injected into each site with trigger point deactivation.  The patient tolerated the procedure well.  Postinjection instructions  given.      Erick Colace, M.D.  Electronically Signed     AEK/MEDQ  D:  05/20/2008 16:12:33  T:  05/21/2008 02:19:04  Job:  [id number]

## 2010-11-09 NOTE — Procedures (Signed)
NAMELOVEAH, LIKE                ACCOUNT NO.:  [id number]   MEDICAL RECORD NO.:  [id number]          PATIENT TYPE:  REC   LOCATION:  [facility]                          FACILITY:  [facility]   PHYSICIAN:  Erick Colace, M.D.DATE OF BIRTH:  1951-03-29   DATE OF PROCEDURE:  07/10/2008  DATE OF DISCHARGE:                               OPERATIVE REPORT   Ms. Landgren returns today for cervical medial branch blocks, right-sided  neck pain, cervical post laminectomy syndrome.   PROCEDURE:  C3, C4, C5 medial branch blocks right under fluoroscopic  guidance.   INDICATIONS:  Right-sided neck pain as noted above.  Pain interferes  with activity of daily living.   The patient placed in the lateral decubitus position, lateral views.  Areas marked and prepped with Betadine.  First a skin wheal was raised  with a 27-gauge 5-inch needle 1/2 mL of 1% lidocaine used.  Then, a 25-  gauge 1-1/2-inch needle was inserted targeting the midpoint of lateral  mass C3, C4, C5.  AP and lateral imaging utilized.  Once proper location  was localized on fluoroscopic images, 0.3 mL of 1% MPF lidocaine were  injected.  The patient tolerated procedure well at each site.  The  patient tolerated procedure well.  Pre and post injection vitals stable.  Post-injection instructions given.   The patient tolerated the procedure well.  Pre-injection pain level of  4/10, post-injection 2/10.  We will see her in followup.  Consider  referral for cervical radiofrequency procedure.      Erick Colace, M.D.  Electronically Signed     AEK/MEDQ  D:  07/10/2008 11:19:38  T:  07/11/2008 01:20:14  Job:  [id number]

## 2010-11-09 NOTE — Assessment & Plan Note (Signed)
[name] returns today.  I last saw her about 1 month ago.  She has a  history of cervical pain and C5-6 ACDF in 2009, by Dr. Jeral Fruit.  Main  complaint is in the thoracic area, has disc herniations at T8-9, T9-10,  and T11-12.  Reportedly response to T8-9 thoracic epidural in the past.  She underwent a T4-5 epidural which was not helpful for her.  Dr. Stevphen Rochester  then went down, lower level T8-9, which once again did not have any  significant relief.  She is complaining mainly of neck pain on the right  side and then the mid back pain.  She has other comorbidities including  fibromyalgia syndrome.   Her medications include oxycodone 50 mg immediate release t.i.d. and  tramadol 50 mg t.i.d.   She feels like her pain does not relieve to an adequate degree at this  point.  She has never tried a TENS unit but is open to considering this.  She tried trazodone for sleep, but this made her feel nervous.   Her Oswestry index is 54%.   IMPRESSION:  1. Thoracic pain.  Some of this may be radiating from the cervical      spine especially on the right side.  We will do cervical medial      branch blocks.  2. Physical therapy trial.  The patient really did not have any      significant improvements.  Discharging to home exercise program.   We will continue current meds, except for sleep we will go ahead and  prescribe Ambien 10 mg that she will take one-half to one p.r.n.  insomnia.   No changes in narcotic analgesics at this time.  1. TENS trial, we will go through [facility].      Erick Colace, M.D.  Electronically Signed     AEK/MedQ  D:  06/17/2008 16:55:11  T:  06/18/2008 06:15:06  Job #:  [id number]

## 2010-11-09 NOTE — Procedures (Signed)
[name], [name] NO.:  [id number]   MEDICAL RECORD NO.:  [id number]           PATIENT TYPE:   LOCATION:                                 FACILITY:   PHYSICIAN:  Celene Kras, MD        DATE OF BIRTH:  1950/08/11   DATE OF PROCEDURE:  DATE OF DISCHARGE:                               OPERATIVE REPORT   [name].  I evaluated  her and reviewed the health and history form and 14-point review of  systems.   1. I think it is probably reasonable to go to thoracic epidural as she      does have pathology here, she has some radicular symptoms, and she      seems to be responding to these interventions.  She wants to be as      active as possible, minimizing escalation of controlled substances.      Dr. Wynn Banker is managing this.  2. Other modifiable features in health profile discussed.  Again,      weight control, home based therapy recommended, the biomechanical      model to our physiatry colleagues, Dr. Wynn Banker.  3. I will go ahead and proceed with T8-9 thoracic epidural, then      follow her expectantly.  Dr. Wynn Banker will define further course      of care, as I reviewed this with her.  She does have some      spondylitic pain in the lumbar region as well, perhaps that would      be responsive to either muscle stimulation, or facet intervention      and a multimodality approach.   Objectively, diffuse subparathoracic myofascial, with positive side  bending pain with extension and range of motion activities.  Mostly  myofascial mechanical pain with modified Gaenslen's and Patrick's  equivocal.  Nothing new neurologically.   IMPRESSION:  Degenerative spine disease, thoracic spine; spondylosis,  lumbar spine; exogenous obesity; paracervical myofascial pain, secondary  spondylosis.   PLAN:  Thoracic epidural.  She is consented.  Predicated further  intervention based on need and overall response.  Questions  were  answered discussed in lay terms.   The patient was taken to fluoroscopy suite and placed in the prone  position.  The back prepped and draped in usual fashion using a Hustead  needle advanced into the T7-8 interspace without any evidence of CSF,  heme, or paresthesia.  Test block uneventfully followed 40 mg of  Aristocort and flush needle.   Tolerated procedure well.  No complications from our procedure.  Appropriate recovery.  Discharge instructions given.           ______________________________  Celene Kras, MD     HH/MEDQ  D:  05/27/2008 10:41:02  T:  05/28/2008 00:15:04  Job:  [id number]

## 2010-11-09 NOTE — Procedures (Signed)
[name], [name]                ACCOUNT NO.:  [id number]   MEDICAL RECORD NO.:  [id number]           PATIENT TYPE:   LOCATION:                                 FACILITY:   PHYSICIAN:  Erick Colace, M.D.DATE OF BIRTH:  Jun 23, 1951   DATE OF PROCEDURE:  07/10/2008  DATE OF DISCHARGE:                               OPERATIVE REPORT   PROCEDURE:  Bilateral L5 dorsal ramus injection, bilateral L4 medial  branch block, bilateral L3 medial branch block under fluoroscopic  guidance.   INDICATIONS:  Lumbar pain.  Pain is only partially responsive to  medication management and other conservative care.   Informed consent was obtained after describing risks and benefits of  procedure with the patient.  These include bleeding, bruising,  infection.  Benefits include improvement in her deficits and self-care  and mobility.  She did get temporary relief from prior 7 injections in  November 2009 at corresponding levels.   The patient was placed prone on fluoroscopy table.  Betadine prep,  sterile drape.  A 25-gauge inch and a half needle was used to  anesthetize the skin and subcu tissue, 1% lidocaine x2 mL.  Then, a 22-  gauge 5-inch spinal needle was inserted under fluoroscopic guidance  first targeting the left S1 SAP sacral ala junction, bone contact made,  confirmed with lateral imaging.  Omnipaque 180 under live fluoro  demonstrated no intravascular uptake.  Then, 0.5 mL of a solution  containing 1 mL of 40 mg/mL dexamethasone, 2 mL of 2% MPF lidocaine were  injected.  Then, the left L5 SAP transverse junction targeted, bone  contact made.  Omnipaque 180 under live fluoro demonstrated no  intravascular uptake followed by injection of 0.5 mL dexamethasone  lidocaine solution.  Then, the left L4 SAP transverse process junction  targeted, bone contact made, confirmed.  Omnipaque 180 x0.5 mL  demonstrated no intravascular uptake and 0.5 mL of dexamethasone  lidocaine solution was  injected.  Same procedure was repeated on the  right side.  Same needle injectate and technique.  The patient tolerated  the procedure well.  Postinjection instructions given.      Erick Colace, M.D.  Electronically Signed     AEK/MEDQ  D:  07/10/2008 11:55:28  T:  07/11/2008 02:04:52  Job:  [id number]

## 2010-11-09 NOTE — Procedures (Signed)
[name], [name] NO.:  [id number]   MEDICAL RECORD NO.:  [id number]          PATIENT TYPE:  REC   LOCATION:  [facility]                          FACILITY:  [facility]   PHYSICIAN:  Celene Kras, MD        DATE OF BIRTH:  1951-01-01   DATE OF PROCEDURE:  DATE OF DISCHARGE:                               OPERATIVE REPORT   [name].   1. I evaluated her, reviewed the health and history form and 14-point      review of systems; reviewed progress to date, overall directed care      approach; reviewed imaging, and discussed treatment limitations and      options.  I have used models and discussed in lay terms.  2. Other modifiable features in health profile discussed.  The      mechanism of injury is reviewed with her, as well as her pain,      which is a mixed degenerative spondylitic pain.  3. I am going to go ahead and proceed today with a thoracic epidural      as a broad brush stroke as she has extensive myofascial pain, some      modest radicular pain, and difficulty with function and quality of      life indices.  Predicate further intervention based on need.  I      will see her in 2 weeks in followup.  I would also consider      addressing the spondylitic pain, RF could be an option.   Objectively, diffuse parathoracic myofascial, side-bending positive pain  with extension.  Extensive myofascial pain.  Paracervical pain is mostly  suprascapular, paracervical, levator scapular extension.  Side bending.  Pain with extension as well.  Nothing new neurologically.   IMPRESSION:  She has positive cervical spine.  She has positive thoracic  spine.   PLAN:  Thoracic epidural, predicate further intervention based on need  and overall response.  Questions were answered and discussed in lay  terms.   The patient was taken to fluoroscopy suite and placed in prone position.  Back prepped and draped in the usual fashion.   Using a Hustead needle, I  advanced into T4-5 interspace without any evidence of CSF, heme, or  paresthesia.  Test block uneventfully, followed by 40 mg of Aristocort  and flushed needle.   Tolerated procedure well.  No complications from our procedure.  We will  see her back in about 2 weeks and determine further course of care based  on response.  Questions were answered, discussed in lay terms.           ______________________________  Celene Kras, MD     HH/MEDQ  D:  04/29/2008 13:49:58  T:  04/30/2008 04:02:40  Job:  [id number]

## 2010-11-09 NOTE — Procedures (Signed)
[name], [name]                ACCOUNT NO.:  [id number]   MEDICAL RECORD NO.:  [id number]          PATIENT TYPE:  REC   LOCATION:  [facility]                          FACILITY:  [facility]   PHYSICIAN:  Erick Colace, M.D.DATE OF BIRTH:  09-30-1950   DATE OF PROCEDURE:  DATE OF DISCHARGE:                               OPERATIVE REPORT   PROCEDURE:  This is a left sacroiliac injection under fluoroscopic  guidance.   INDICATIONS:  Left sacroiliac pain of 3 months duration, only partial  response to medication management and other conservative care.  Pain  does interfere with her sleep as well as her walking, bending, and  sitting.   Informed consent was obtained after describing risks and benefits of the  procedure with the patient.  These include bleeding, bruising,  infection.  She elects to proceed and has given written consent.  She  has had MRI last fall which demonstrated no evidence of tumor or  infection.  This is in her lumbosacral spine.   The patient was placed prone on fluoroscopy table.  Betadine prep,  sterile drape, 25-gauge 1.5 inch needle was used to anesthetize the skin  and subcutaneous tissue with 1% lidocaine x2 mL and 25-gauge 3-inch  spinal needle was inserted under fluoroscopic guidance starting at left  SI joint.  AP, lateral, oblique images utilized.  Omnipaque 180 under  live fluoro demonstrated no intravascular uptake followed by an  injection of 1.5 mL of 4 mg/mL of Depo-Medrol and 1 mL of 2% MPF  lidocaine.  The patient tolerated the procedure well.  Pre and post  injection vitals stable.  Post injection instructions given.  Return in  1 month followup versus repeat injection if only short-term relief.      Erick Colace, M.D.  Electronically Signed     AEK/MEDQ  D:  08/11/2008 13:26:04  T:  08/12/2008 01:51:51  Job:  [id number]

## 2010-11-09 NOTE — Procedures (Signed)
[name], [name]                ACCOUNT NO.:  [id number]   MEDICAL RECORD NO.:  [id number]           PATIENT TYPE:   LOCATION:                                 FACILITY:   PHYSICIAN:  Erick Colace, M.D.DATE OF BIRTH:  [birth year]   DATE OF PROCEDURE:  12/11/2008  DATE OF DISCHARGE:                               OPERATIVE REPORT   PROCEDURE:  Right C3, C4, C5 facet medial branch block.   INDICATIONS:  Right-sided neck and upper scapular pain which has been  previously responsive to injections at these levels and not responding  well to medication management.  Pain does interfere with activities of  daily living.   Informed consent was obtained after describing risks and benefits of the  procedure with the patient.  These include bleeding, bruising, and  infection.  She elects to proceed and has given written consent.  The  patient was placed prone on exam table.  Betadine prep, sterile drape,  25-gauge 1-/1/2 inch needle was used to anesthetize the skin and  subcutaneous tissue, 1% lidocaine x1 mL at each 3 sites using a 27-gauge  5-inch needle.  Then, a 25-gauge 1-1/2 inch needle was inserted under  fluoroscopic guidance, starting the midpoint of the articular pillar C3,  bone contact made, confirmed with AP imaging as well as lateral imaging.  Omnipaque 180 x 0.3 mL demonstrated no intravascular uptake followed by  injection of 0.3 mL of 2% MPF lidocaine.  Then, the right C4 articular  pillar midpoint was targeted.  Bone contact made, confirmed with AP  imaging followed by injection of Omnipaque 180 x 0.3 mL demonstrating no  intravascular uptake.  Then 0.3 mL of 2% MPF lidocaine was injected.  The same procedure was repeated at the C5 level using same needle  injectate and technique.  The patient tolerated the procedure well.  Pre  and post injection vitals stable.  Post injection instructions given.  Pre injection pain level 6/10 and post injection was 4/10.  We will  monitor overtime, if at least 50% response, refer to Dr. Stevphen Rochester for  radiofrequency procedure.      Erick Colace, M.D.  Electronically Signed     AEK/MEDQ  D:  12/11/2008 11:41:55  T:  12/12/2008 01:42:25  Job:  [id number]

## 2010-11-09 NOTE — Procedures (Signed)
[name], [name]                ACCOUNT NO.:  [id number]   MEDICAL RECORD NO.:  [id number]           PATIENT TYPE:   LOCATION:                                 FACILITY:   PHYSICIAN:  Erick Colace, M.D.DATE OF BIRTH:  December 26, 1950   DATE OF PROCEDURE:  07/10/2008  DATE OF DISCHARGE:                               OPERATIVE REPORT   PROCEDURE:  Bilateral L5 dorsal ramus injection, bilateral L4 medial  branch block, bilateral L3 medial branch block under fluoroscopic  guidance.   INDICATIONS:  Lumbar pain.  Pain is only partially responsive to  medication management and other conservative care.   Informed consent was obtained after describing risks and benefits of  procedure with the patient.  These include bleeding, bruising,  infection.  Benefits include improvement in her deficits and self-care  and mobility.  She did get temporary relief from prior 7 injections in  November 2009 at corresponding levels.   The patient was placed prone on fluoroscopy table.  Betadine prep,  sterile drape.  A 25-gauge inch and a half needle was used to  anesthetize the skin and subcu tissue, 1% lidocaine x2 mL.  Then, a 22-  gauge 5-inch spinal needle was inserted under fluoroscopic guidance  first targeting the left S1 SAP sacral ala junction, bone contact made,  confirmed with lateral imaging.  Omnipaque 180 under live fluoro  demonstrated no intravascular uptake.  Then, 0.5 mL of a solution  containing 1 mL of 40 mg/mL dexamethasone, 2 mL of 2% MPF lidocaine were  injected.  Then, the left L5 SAP transverse junction targeted, bone  contact made.  Omnipaque 180 under live fluoro demonstrated no  intravascular uptake followed by injection of 0.5 mL dexamethasone  lidocaine solution.  Then, the left L4 SAP transverse process junction  targeted, bone contact made, confirmed.  Omnipaque 180 x0.5 mL  demonstrated no intravascular uptake and 0.5 mL of dexamethasone  lidocaine solution was  injected.  Same procedure was repeated on the  right side.  Same needle injectate and technique.  The patient tolerated  the procedure well.  Postinjection instructions given.      Erick Colace, M.D.     AEK/MEDQ  D:  07/10/2008 11:55:28  T:  07/11/2008 02:04:52  Job:  [id number]

## 2010-11-09 NOTE — Assessment & Plan Note (Signed)
[name] returns today.  She is a 60 year old female with a long  history of fibromyalgia.  She has cervical pain and has had C5-C6 ACDF  in 2009, per Dr. Jeral Fruit.  Her main pain complaint is the thoracic area.  She has had disc herniations at T8-T9, T9-T10, and T11-T12 and has  responded T8-T9 thoracic epidural steroid injections in the past.   In the interval time period since I last saw her in October, she has  undergone T4-T5 thoracic epidural which was not particularly helpful for  her pain complaints.   MEDICATIONS:  We got her off her OxyContin 15 mg b.i.d. with a Percocet  5 mg 1-3 per day and we put her on 15 mg of oxycodone immediate release  t.i.d. with tramadol 50 mg t.i.d.  She overall feels better on this  medication.   Oswestry disability index is 54%.   REVIEW OF SYSTEMS:  Positive for depression, spasms, numbness, tingling,  nausea, and constipation.   ALLERGIES:  PENICILLIN, TRIPTANS, ALEVE, CYMBALTA, FLEXERIL, BIAXIN and  FLAGYL.   CURRENT FUNCTIONAL STATUS:  Independent.  She needs some help with meal  prep and household.   She is attending physical therapy and doing aquatic therapy.   SOCIAL HISTORY:  Married and lives with her husband.   FAMILY HISTORY:  Heart disease, lung disease, diabetes, high blood  pressure, and Alzheimer disease.   PHYSICAL EXAMINATION:  VITAL SIGNS:  Her blood pressure is 153/79, pulse  70, respirations 18, and O2 sats 97% on room air.  GENERAL:  Well-developed, well-nourished female.  Orientation x3.  Affect is alert.  Gait is normal.  MUSCULOSKELETAL:  She has tenderness in the upper trapezius as well as  upper medial scapula border.  She has normal strength in upper and lower  extremities.  Deep tendon reflexes are normal.  Neck range of motion 50%  range forward flexion and extension.  She has no tenderness to palpation  over her spinous processes in the back.   IMPRESSION:  1. Thoracic disk.  2. Fibromyalgia.  3.  Chronic postoperative pain.  4. Myofascial pain.  5. Left upper medial scapula border versus facet syndrome.   PLAN:  1. We will go ahead and do a trigger point injection.  2. Continue on oxycodone 15 mg t.i.d.  3. Ask Dr. Stevphen Rochester to go down to the T8-T9 level for her thoracic      epidural steroid injection.   I will see her back in 1 month in follow up.      Erick Colace, M.D.  Electronically Signed     AEK/MedQ  D:  05/20/2008 16:09:40  T:  05/21/2008 04:51:49  Job #:  [id number]   cc:   Lillia Carmel, M.D.  Fax: [phone]

## 2010-11-09 NOTE — Assessment & Plan Note (Signed)
Ms. Feigenbaum is here originally for C3-C5 medial branch blocks; however,  her neck is not bothering her as much as her low back area, which has  been steadily increasing in pain since November.  She has had MRI  demonstrating no evidence of tumor or infection in that area; however,  pain has progressed to the point where it is starting to interfere with  sleep.   She does have evidence of facet arthropathy L3 through S1.  No evidence  of nerve root impingement.   PHYSICAL EXAMINATION:  GENERAL:  The patient was examined.  No acute  distress.  Mood and affect appropriate.  Gait shows no evidence of toe  drag or knee instability.  VITAL SIGNS:  Blood pressure 150/55, pulse 78, O2 sat 97% on room air.  She had 7/10 pain in the hip area.  EXTREMITIES:  Lower extremity strength is normal.  She has pain in the  left PSIS.  Lumbar spine forward flexion is without pain, but when she  starts coming up that is painful in the SI joint.   IMPRESSION:  Sacroiliac disorder.   PLAN:  Pain is only partial responsive to medication management included  narcotic analgesic medications.  We will do diagnostic/therapeutic SI  joint injection and proceed from there, may need a round of physical  therapy plus minus repeat injection.      Erick Colace, M.D.  Electronically Signed     AEK/MedQ  D:  08/11/2008 13:34:44  T:  08/12/2008 01:54:23  Job #:  [id number]

## 2010-11-09 NOTE — Assessment & Plan Note (Signed)
[name] reports that her left sacroiliac joint pain is better after  injection August 11, 2008 and she has persistent good pain.  Her main  concerns are in her mid back area.  She is also concerned that she may  have broken out from one of her supplements either the magnesium,  vitamin D, or coenzyme Q10.  Average pain is 7-8/10, described as sharp,  burning, constant, and aching.  Appears with activity at 9/10.  Sleeps  is poor.  Pain improves with rest, heat medication, and TENS.  She  climbs few steps at a time.  She walks 5 minutes at a time.  She rarely  drives.  She needs assist with meal prep, household duties, and  shopping.   REVIEW OF SYSTEMS:  Positive for spasms, confusion, depression, night  sweats, and skin rash.   PAST SURGICAL HISTORY:  January 2009, C5-C6 fusion.   PAST MEDICAL HISTORY:  Significant for fibromyalgia as well as GERD.  She is married, lives with her husband.   PHYSICAL EXAMINATION:  VITAL SIGNS:  Blood pressure 144/53, pulse 70,  respiratory rate 16, and O2 sat 97% on room air.  GENERAL:  No acute distress.  Mood and affect appropriate.  MUSCULOSKELETAL:  Her back has mild tenderness to palpation in the  thoracic paraspinals.  She has no pain with range of motion in that  area.  She has normal strength in lower extremity, normal deep tendon  reflexes, normal strength in the upper extremities as well.   IMPRESSION:  Thoracic paraspinal pain parascapular area, myofascial  plus/minus fibromyalgia.  Do not think this is a cervical spine related.  Does not appear to have any change with movement.   No neurologic signs in the lower extremities or the upper extremities.   PLAN:  We will go ahead and trial some Voltaren gel.  We will continue  her hydrocodone 10/325, but increased to t.i.d.  I will see her back in  1-2 months for possible trigger point injections if she does not have  any improvement with the parascapular strengthening using a  Thera-Band  as I have instructed her to do so.      Erick Colace, M.D.  Electronically Signed     AEK/MedQ  D:  09/09/2008 16:41:54  T:  09/10/2008 04:11:53  Job #:  [id number]   cc:   Hilda Lias, M.D.  Fax: [phone]

## 2010-11-09 NOTE — Assessment & Plan Note (Signed)
[name] returns today.  She has a history of cervical postlaminectomy  syndrome status post ACDF C5-C6 2009 per Dr. Jeral Fruit.   She has small disk herniation at T8-T9, T9-T10, and T11-12, however, she  had no significant relief with either T4-T5 or T8-T9 epidural steroid  injection.  Her comorbidities include fibromyalgia syndrome.   She did not tolerate tramadol and has been switched from oxycodone to  hydrocodone.  She has had good effect from sacroiliac injections on the  left side, last performed August 11, 2008 with persistent improvement.   She has had no new medical problems in the interval time.  She now sees  Dr. Karleen Hampshire Copland.  Her pain inventory indicates average pain is 6-  7/10, interferes with activity at 8/10.  Sleep is poor.  Pain is worse  with walking, bending, sitting, and standing.  Rest helped temporarily.  Heat and ice, helped.  Therapy exercise, pacing activities, TENS  injections and medications all resulted in some improvement.  Relief  from meds is fair.  She can walk 5-10 minutes at a time.  She can  ambulate holding onto the railing.  She going up and down steps.  Otherwise ambulates without assistive devices.  She rarely drives, but  is able to.  She needs some assist with meal prep, household duties, and  shopping.   REVIEW OF SYSTEMS:  Otherwise positive for numbness, tingling,  depression, headaches, and ankle swelling.   SOCIAL HISTORY:  Married, lives with her husband.pursuing disability,  reviewed psych eval who feels disability is indicated for psych reasons   PHYSICAL EXAMINATION:  Blood pressure 147/69, pulse 73, respirations 16  and O2 saturation 97% room air.  Her Oswestry score 66%.  Her neck has  no tenderness along the spinous process, though she does have tenderness  right upper trap, right upper medial scapular border, right  infraspinatus process and along the thoracic paraspinals T5-T9.  Upper  and lower extremity range of motion  and strength are normal.  Gait is  normal.  Elevation per fibromyalgia tender points is positive along  trapezius upper medial scapular borders bilaterally and low back.  She  does have increased pain on the right side worse on the left side.   IMPRESSION:  Neck and upper back pain, does not appear to be related to  her thoracic disk.  Certainly epidurals are not helpful.  I do suspect  that she may have some cervical facet syndrome, which accounts for at  least a portion of her pain some of it is likely radiating to the  parascapular area.  In addition, I suspect that she does have some  myofascial pain superimposed on her fibromyalgia syndrome.   PLAN:  1. We will do trigger points today.  2. Scheduled for medial branch blocks, I think if trigger points fail      this would be the next step.  She did have a positive response x1      C3, C4, and C5  3. Continue current medications which include Voltaren gel as well as      gabapentin 600 t.i.d.  This is actually      increased dosage from 400.  We will also continue her hydrocodone      10/325 one p.o. t.i.d.  Discussed medication changes.  She is in      agreement with trigger point injections today as well.      Erick Colace, M.D.  Electronically Signed  AEK/MedQ  D:  11/04/2008 16:01:25  T:  11/05/2008 04:03:25  Job #:  [id number]   cc:   Juleen China, MD

## 2010-11-09 NOTE — Assessment & Plan Note (Signed)
[facility]                        [name]   [name], [name]                       MRN:          [id number]  DATE:11/07/2006                            DOB:          [birth year]    REASON FOR VISIT:  [name] was seen on Nov 07, 2006, for classic  signs of rhinosinusitis.  She had been given Zithromax from her  allergist on April 23, but continues to have right-sided facial pain,  congestion and nasal obstruction.  She denies any nasal purulence or  anosmia.  She has had low-grade fever of a half a degree.  She has also  had fatigue, halitosis and right-sided headaches in the frontal and  temple area.  She has had pain in the maxillary teeth on the right.  She  denies earache.  Cough has been productive of clear foam.   Significantly, she was evaluated by Dr. Patric Dykes for diarrhea.  She  states that colonoscopy suggested hyperthyroidism.  Thyroid function  tests at that time were normal.  These were done 3 weeks off Armour  Thyroid.   She was weaned and discontinued the pain medications and generally feels  better.   She continues to have some sleep issues.  The present cough has  aggravated insomnia.   ALLERGIES:  PENICILLIN, EFFEXOR, ALEVE, BIAXIN, IMITREX, MAXALT, ZOMIG  and at times she is unable to take IBUPROFEN.  Nausea and vomiting  occurred when she took BIAXIN.   PHYSICAL EXAMINATION:  VITAL SIGNS:  Weight is down 4 pounds to 183,  temperature 98.3, pulse 72, respirations 16, blood pressure 128/60.  HEENT:  She has no scleral icterus.  Extraocular movements intact.  The  right nare is erythematous and essentially occluded.  Dental hygiene is  good.  There is minimal erythema in posterior pharynx.  The right  tympanic membrane is dull, the left is normal.  NECK:  She has no lymphadenopathy of the next or axilla.  CHEST:  Clear, but she has a brassy cough.   ASSESSMENT/PLAN:  Initially, I considered  cefuroxime for the  rhinosinusitis, but I would be concerned about exacerbation of diarrhea.  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  She should avoid alcohol while taking the metronidazole.  Additionally, she will be given Phenergan with codeine for cough.  She  is encouraged to use a Netipot once or twice a day to allow lavage and  drainage of the sinuses.   I would recommend repeating a full thyroid function test after she has  been off the Armour Thyroid for 8-12 weeks to give an optimal assessment  of her endocrine status.  Labs from April  8, were reviewed and were essentially normal.  These included CBC and  differential, basic metabolic profile and hepatic profile.     Titus Dubin. Alwyn Ren, MD,FACP,FCCP     [facility]/MedQ  DD: 11/07/2006  DT: 11/07/2006  Job #: [id number]
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
## 2010-11-09 NOTE — Op Note (Signed)
[name], [name]                ACCOUNT NO.:  [id number]   MEDICAL RECORD NO.:  [id number]          PATIENT TYPE:  AMB   LOCATION:  SDS                          FACILITY:  [facility]   PHYSICIAN:  Hilda Lias, M.D.   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   PROCEDURE:  Anterior 5-6 diskectomy, decompression of the spinal cord,  bilateral foraminotomy, fusion with allograft and autograft, plate,  microscope.   SURGEON:  Hilda Lias, M.D.   ASSISTANT:  Hewitt Shorts, M.D.   INDICATIONS FOR PROCEDURES:  The patient complained of neck pain,  radiation to both upper extremities.  The patient many years ago  underwent fusion of 4-5.  She had failed with conservative treatment and  she wants to proceed with surgery.   PROCEDURE:  The patient was taken to the OR and after intubation the  left side of the neck was cleaned with DuraPrep.  Transverse incision  through the skin and platysma down to cervical spine was done.  X-rays  showed that indeed were at the level of 5-6.  The anterior osteophyte  was removed.  We brought the microscope into the area and the anterior  ligament was opened.  With the micro curette as well as the drill, we  did a total diskectomy.  We opened the posterior ligament and indeed  there was slight narrowing of the canal not only central but  bilaterally.  Using the 1, 2 and 3 mm Kerrison punch as well as the  drill, we decompressed the spinal cord as well as both C6 nerve roots.  Then the endplates were drilled and allograft of 7 mm with autograft in  the middle was inserted followed by a plate using four screws.  Lateral  cervical spine showed good position of the  graft and the plate.  From  then on the area was irrigated.  After we achieved good hemostasis, the  wound was closed with Vicryl and Steri-Strip.           ______________________________  Hilda Lias, M.D.     EB/MEDQ  D:  07/24/2007  T:  07/25/2007  Job:  [id number]

## 2010-11-09 NOTE — Procedures (Signed)
NAMEJALANI, CULLIFER NO.:  [id number]   MEDICAL RECORD NO.:  [id number]          PATIENT TYPE:  REC   LOCATION:  [facility]                          FACILITY:  [facility]   PHYSICIAN:  Celene Kras, MD        DATE OF BIRTH:  December 30, 1950   DATE OF PROCEDURE:  01/20/2009  DATE OF DISCHARGE:                               OPERATIVE REPORT   Marka Treloar has demonstrated clear positive predictive experience,  right facet medial branch intervention, with decreased frequency,  intensity, and duration of headaches, less myofascial pain, better range  of motion, less medication usage parameters.  I have reviewed the  benchmarks with her, I think it is reasonable to go onto RF.  I planned  C3 and thorax at the nerve 4 and 5, she had independent needle access  points with 4-mm active tip.  She understands with the RF, the third  occipital nerves to help with her headaches, but it might leave a little  residual dizziness, and I have reviewed expectations, and reviewed  potential complications.  That includes potential for bleeding,  infection, nerve damage, stroke, seizure, and potential for unexpected  reaction to medications, or even death.  She understands and wishes to  proceed.   We planned a right-sided above-mentioned levels, and follow up with her.  We may need to do more level, but these seem to be most problematic.  She wants to try conservative management.  She is consented.   Objectively, no significant interval change, myofascial mechanical pain,  decreased range of motion.  Suboccipital compression test positive with  this cervical facetal compression test.  Right side.  Some left-side,  but that is much less so than the left.   IMPRESSION:  Spondylosis, degenerative spine disease, cervical spine.   PLAN:  Cervical facet medial branch intervention, right side at T4 and  T5, independent needle access points under local anesthetic is used.   The patient taken to the  fluoroscopy suite and placed in supine  position.  Neck prepped and draped in the usual fashion.  Using a 22  gauge RF needle, I advanced the facet at the medial branch above-  mentioned locations probe.  The stimulate motor sensory is above and  confirmed placement in multiple positions.  I then inject 1-1/2 mL of  lidocaine, 1% MPF at each level with the total of 40 mg Aristocort  divided dose.   Lesion is performed at 60 degrees and 60 seconds each level.   She tolerated the procedure well.  No complications from our procedure.  Appropriate recovery.  Consider contralateral side at the later date.  We will see how she does here.           ______________________________  Celene Kras, MD    HH/MEDQ  D:  01/20/2009 12:14:31  T:  01/21/2009 02:22:49  Job:  [id number]

## 2010-11-09 NOTE — Procedures (Signed)
[name], [name]                ACCOUNT NO.:  [id number]   MEDICAL RECORD NO.:  [id number]          PATIENT TYPE:  REC   LOCATION:  [facility]                          FACILITY:  [facility]   PHYSICIAN:  Erick Colace, M.D.DATE OF BIRTH:  1950/09/29   DATE OF PROCEDURE:  11/04/2008  DATE OF DISCHARGE:                               OPERATIVE REPORT   This is a right upper trapezius, right levator scapula, right  infraspinatus as well as bilateral T5, T7, and T9 erector spinae trigger  point injections.   INDICATIONS:  Myofascial pain syndrome.  Pain is only partially  responsive to medication management including narcotic analgesics and  interferes with self-care mobility.   Informed consent was obtained after describing risks and benefits of the  procedure with the patient.  These include bleeding, bruising, and  infection.  She elects to proceed and has given written consent.  The  patient placed in a seated position.  The area was marked and prepped  with Betadine and alcohol, entered with 25-gauge inch and half needle, 1  mL of 1% lidocaine injected into each site in the upper back area and  along the thoracic paraspinal with 1-1/2 mL after negative drawback for  blood.  The patient tolerated the procedure well.  Pre and post  injection vitals stable.  Post injection instructions given.  We will  see her back in 1 month other repeat or if not particularly effective  consider cervical medial branch block.      Erick Colace, M.D.  Electronically Signed     AEK/MEDQ  D:  11/04/2008 15:55:58  T:  11/05/2008 04:41:22  Job:  [id number]

## 2010-11-12 NOTE — Letter (Signed)
October 17, 2006    Willow Ora, MD  [phone] [place], [place] [address]   RE:  [name], [name]  MRN:  [id number]  /  DOB:  April 30, 1951   Dear Dr. Drue Novel:   Upon your kind referral, I had the pleasure of evaluating your patient  and I am pleased to offer my findings.  I saw Luz Mares in the office  today.  Enclosed is a copy of my progress note that details my findings  and recommendations.   Thank you for the opportunity to participate in your patient's care.    Sincerely,      Barbette Hair. Arlyce Dice, MD,FACG  Electronically Signed    RDK/MedQ  DD: 10/17/2006  DT: 10/17/2006  Job #: [id number]
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
## 2010-11-12 NOTE — Op Note (Signed)
[facility]  Patient:    [name], [name] Visit Number: [id number] MRN: [id number]          Service Type: SUR Location: 4W 0478 01 Attending Physician:  Vikki Ports. Dictated by:   Earna Coder, M.D. Proc. Date: 06/26/01 Admit Date:  06/26/2001                             Operative Report  PREOPERATIVE DIAGNOSIS:  Chronic cholecystitis.  POSTOPERATIVE DIAGNOSIS:  Chronic cholecystitis.  OPERATION PERFORMED:  Laparoscopic cholecystectomy.  SURGEON:  Stephenie Acres, M.D.  ASSISTANT:  Rose Phi. Maple Hudson, M.D.  ANESTHESIA:  General.  DESCRIPTION OF PROCEDURE:  The patient was taken to the operating room and placed in supine position.  After adequate anesthesia was induced using endotracheal tube, the abdomen was prepped and draped in normal sterile fashion.  Using a transverse infraumbilical incision, I dissected down to the fascia.  The fascia was opened vertically.  The peritoneum was entered.  A 0 Vicryl pursestring suture was placed around the fascial defect.  The Hasson trocar was placed in the abdomen and the abdomen was insufflated with carbon dioxide.  Under direct visualization, an 11 mm trocar was placed in the subxiphoid region and two 5 mm ports were placed in the right abdomen.  The gallbladder was identified, had some minimal edematous changes.  It was retracted cephalad.  Dissection of the infundibulum easily showed a thin, long cystic duct.  Its junction with the gallbladder and the common duct were identified.  It was triply clipped and divided.  The cystic artery was also identified, triply clipped and divided.  The gallbladder had a very thin mesentery.  This was taken down between it and the liver bed and removed through the umbilical port.  Adequate hemostasis was ensured.  The right upper quadrant was copiously irrigated.  The abdomen was allowed to deflate after all trocars were removed.  The  infraumbilical fascial defect was closed with a 0 Vicryl pursestring suture.  The skin incisions were closed with subcuticular 4-0 Monocryl.  Dermabond and Steri-Strip dressings were placed.   The patient tolerated the procedure well and went to PACU in good condition. Dictated by:   Earna Coder, M.D. Attending Physician:  Vikki Ports DD:  06/26/01 TD:  06/26/01 Job: [id number]

## 2010-11-12 NOTE — Letter (Signed)
October 17, 2006    [name]   RE:  AVAIAH, STEMPEL  MRN:  [id number]  /  DOB:  01/11/1951   Dear Ms. Kille:   It is my pleasure to have treated you recently as a new patient in my  office.  I appreciate your confidence and the opportunity to participate  in your care.   Since I do have a busy inpatient endoscopy schedule and office schedule,  my office hours vary weekly.  I am, however, available for emergency  calls every day through my office.  If I cannot promptly meet an urgent  office appointment, another one of our gastroenterologists will be able  to assist you.   My well-trained staff are prepared to help you at all times.  For  emergencies after office hours, a physician from our gastroenterology  section is always available through my 24-hour answering service.   While you are under my care, I encourage discussion of your questions  and concerns, and I will be happy to return your calls as soon as I am  available.   Once again, I welcome you as a new patient and I look forward to a happy  and healthy relationship.    Sincerely,      Barbette Hair. Arlyce Dice, MD,FACG  Electronically Signed   RDK/MedQ  DD: 10/17/2006  DT: 10/17/2006  Job #: [id number]

## 2010-12-01 ENCOUNTER — Encounter: Payer: Medicare Other | Attending: Physical Medicine & Rehabilitation | Admitting: Neurosurgery

## 2010-12-01 DIAGNOSIS — M542 Cervicalgia: Secondary | ICD-10-CM | POA: Insufficient documentation

## 2010-12-01 DIAGNOSIS — M961 Postlaminectomy syndrome, not elsewhere classified: Secondary | ICD-10-CM

## 2010-12-01 DIAGNOSIS — Z981 Arthrodesis status: Secondary | ICD-10-CM | POA: Insufficient documentation

## 2010-12-02 NOTE — Assessment & Plan Note (Signed)
[name] returns today with complaints of her mid and upper back and right shoulder.  She states really had not changed since she saw Dr. Wynn Banker last in March.  She states she has had cervical surgery in the past with Dr. Jeral Fruit.  She did get sent over to Dr. Wynn Banker for RFA which resulted in [facility], states she has had some trouble sleeping since that time but overall she is doing little better.  REVIEW OF SYSTEMS:  Notable for those difficulties.  She rates her pain at about 2-4, sharp, burning.  All activities tend to aggravate.  Rest, heat, pacing medication, TENS units, injections tend to help mobility. She walks without assistance.  REVIEW OF SYSTEMS:  Notable for those difficulties as well as some skin breakdown, some abdominal pain from time to time.  PAST MEDICAL HISTORY:  Unchanged, history of fibromyalgia.  SOCIAL HISTORY:  Married, lives with her husband.  FAMILY HISTORY:  Unchanged.  PHYSICAL EXAMINATION:  VITAL SIGNS:  Blood pressure is 145/63, pulse 80, respirations 20, O2 sats 90 on room air. EXTREMITIES:  Motor strength is good in the upper and lower extremities. Sensation is intact.  Her gait is normal. GENERAL:  Constitutionally, she is somewhat obese, oriented x3, bright and alert, very pleasant person  IMPRESSION: 1. Cervical post laminectomy syndrome. 2. Facet arthropathy. 3. Fibromyalgia.  PLAN:  We will go ahead and refill her MS Contin 15 mg 1 p.o. t.i.d. 90 with no refill.  She will follow up here in our clinic in 1 months.  Her questions were encouraged and answered today.     Florabelle Cardin L. Blima Dessert Electronically Signed    RLW/MedQ D:  12/01/2010 14:17:35  T:  12/02/2010 16:10:96  Job #:  [id number]

## 2010-12-28 ENCOUNTER — Encounter: Payer: Medicare Other | Attending: Neurosurgery | Admitting: Neurosurgery

## 2010-12-28 DIAGNOSIS — F329 Major depressive disorder, single episode, unspecified: Secondary | ICD-10-CM | POA: Insufficient documentation

## 2010-12-28 DIAGNOSIS — F3289 Other specified depressive episodes: Secondary | ICD-10-CM | POA: Insufficient documentation

## 2010-12-28 DIAGNOSIS — M961 Postlaminectomy syndrome, not elsewhere classified: Secondary | ICD-10-CM

## 2010-12-28 DIAGNOSIS — M129 Arthropathy, unspecified: Secondary | ICD-10-CM | POA: Insufficient documentation

## 2010-12-28 DIAGNOSIS — M542 Cervicalgia: Secondary | ICD-10-CM | POA: Insufficient documentation

## 2010-12-29 NOTE — Assessment & Plan Note (Signed)
ACCOUNT:  [id number].  [name] is a patient of Dr. Wynn Banker and had been seen for chronic neck pain.  She had a fusion in 2009 and back in 1993.  She had several medial branch blocks since then and fairly good results with radiofrequency neurotomy performed by Dr. Stevphen Rochester.  She may choose to investigate that again in the future, if the pain escalates to that point.  Right now, her pain is about a 5, it is sharp, burning and it was constant.  General activity level is 8.  Pain is worse with all activities.  Rest, heat, pacing, medications, injections tend to help.  REVIEW OF SYSTEMS:  Notable for difficulties as described above as well as some weight gain, night sweats, paresthesias, depression.  No suicidal thoughts or aberrant behaviors.  She does drive.  She can walk about 10 minutes without any problems.  PAST MEDICAL HISTORY:  Unchanged.  SOCIAL HISTORY:  She is married, lives with her husband.  FAMILY HISTORY:  Unchanged.  PHYSICAL EXAMINATION:  Blood pressure 156/75, pulse 73, respirations 18, O2 sats 97 on room air.  Her motor strength is good in upper and lower extremities.  She does give-away slightly pain.  Sensation slightly diminished in the upper extremities.  Constitutionally, she is obese. She is alert and oriented x3.  Gait is normal.  Again, she is requesting injections in the future if needed.  IMPRESSION: 1. Cervical laminectomy syndrome. 2. Facet arthropathy. 3. Depression.  PLAN:  We will Refill her MS Contin 15 mg one p.o. t.i.d. 90 with no refill, gabapentin send over electronically, we will follow up here in a month.  Her questions were encouraged and answered.     Bonney Berres L. Blima Dessert Electronically Signed    RLW/MedQ D:  12/28/2010 14:47:35  T:  12/29/2010 05:42:25  Job #:  [id number]

## 2011-01-13 ENCOUNTER — Other Ambulatory Visit: Payer: Self-pay | Admitting: Family Medicine

## 2011-01-13 DIAGNOSIS — Z1231 Encounter for screening mammogram for malignant neoplasm of breast: Secondary | ICD-10-CM

## 2011-01-19 ENCOUNTER — Ambulatory Visit
Admission: RE | Admit: 2011-01-19 | Discharge: 2011-01-19 | Disposition: A | Discharge status: Home / Self Care | Payer: Medicare Other | Source: Ambulatory Visit | Attending: Family Medicine | Admitting: Family Medicine

## 2011-01-19 DIAGNOSIS — Z1231 Encounter for screening mammogram for malignant neoplasm of breast: Secondary | ICD-10-CM

## 2011-01-24 ENCOUNTER — Encounter: Payer: Self-pay | Admitting: *Deleted

## 2011-01-25 ENCOUNTER — Encounter: Payer: Medicare Other | Attending: Physical Medicine & Rehabilitation

## 2011-01-25 ENCOUNTER — Ambulatory Visit: Payer: Medicare Other | Admitting: Physical Medicine & Rehabilitation

## 2011-01-25 DIAGNOSIS — IMO0002 Reserved for concepts with insufficient information to code with codable children: Secondary | ICD-10-CM | POA: Insufficient documentation

## 2011-01-25 DIAGNOSIS — M549 Dorsalgia, unspecified: Secondary | ICD-10-CM | POA: Insufficient documentation

## 2011-01-25 DIAGNOSIS — M961 Postlaminectomy syndrome, not elsewhere classified: Secondary | ICD-10-CM | POA: Insufficient documentation

## 2011-01-26 NOTE — Assessment & Plan Note (Signed)
A 60 year old female with thoracic degenerative disk as well as cervical post laminectomy syndrome.  Her main complaint today is mid back pain. She has had no trauma.  No new medical issues.  No lower extremity pain or numbness.  No spasms in the lower extremities.  She can walk 10 minutes at a time.  She climbs steps with care.  She needs help with meal prep, household duties, and shopping, but otherwise independent. She drives.  REVIEW OF SYSTEMS:  Positive for bladder issues, tremor, tingling, mainly in the shoulder on the right.  SOCIAL HISTORY:  She is married, lives with her husband.  PHYSICAL EXAMINATION:  VITAL SIGNS:  Blood pressure 142/65, pulse 78, respirations 18, O2 sat 96% on room air. GENERAL:  No acute distress.  Mood and affect appropriate. MUSCULOSKELETAL:  Her back has no tenderness to palpation in lumbar paraspinals along the spinous processes.  Her forward flexion, extension, lateral rotation, are not painful, but twisting seems to make her pain worse.  Lower extremity strength range of motion and deep tendon reflexes are normal.  Gait is normal.  IMPRESSION:  Thoracic degenerative disk.  No evidence of thoracic radiculopathy or thoracic myelopathy.  PLAN: 1. We will give her Mobic 15 mg daily x2 weeks. 2. Continue MS Contin 15 mg t.i.d. 3. Mid-Level followup 1 month. 4. I will see her back in 12 weeks.  Discussed with patient, agrees with plan.     Erick Colace, M.D. Electronically Signed    AEK/MedQ D:  01/25/2011 16:17:36  T:  01/25/2011 23:52:41  Job #:  [id number]

## 2011-03-01 ENCOUNTER — Encounter: Payer: Medicare Other | Attending: Neurosurgery | Admitting: Neurosurgery

## 2011-03-01 DIAGNOSIS — IMO0002 Reserved for concepts with insufficient information to code with codable children: Secondary | ICD-10-CM | POA: Insufficient documentation

## 2011-03-01 DIAGNOSIS — M961 Postlaminectomy syndrome, not elsewhere classified: Secondary | ICD-10-CM | POA: Insufficient documentation

## 2011-03-01 NOTE — Assessment & Plan Note (Signed)
HISTORY:  Patient of Dr. Wynn Banker, seen for thoracic degenerative disk disease as well as cervical postlaminectomy syndrome.  She states her pain has been much better lately.  She rates it today at 4, it is a burning and constant pain with some aching.  General activity level is 7.  Pain is worse during the day and night.  Pain is worse walking, bending, sitting, standing and activity.  Rest and heat or ice therapy and medication, TENS unit, injections tend to help.  She walk about 10 minutes without assistance.  She climbs steps and drive.  She is on disability.  REVIEW OF SYSTEMS:  Notable for difficulties as described above as well as some bladder control issues, tremors, spasm, depression, no suicidal thoughts or aberrant behavior.  She has some weight loss and some weight gain, high and low blood sugars, nausea, diarrhea, constipation. Oswestry score 62.  PAST MEDICAL HISTORY, SOCIAL HISTORY AND FAMILY HISTORY:  Unchanged.  PHYSICAL EXAMINATION:  Blood pressure is 121/54, pulse 64, respirations 18, and O2 sats 99 on room air.  Motor strength is good.  Sensation is intact in lower upper and lower extremities.  Constitution is within normal limits.  She is alert and oriented x3.  Her gait is normal.  ASSESSMENT: 1. Thoracic degenerative disk disease. 2. History of cervical postlaminectomy syndrome.  PLAN: 1. She states that the Mobic worked well for, she is only taking 1 or     2 of them and she will continue that if she needs to a rare basis. 2. Refill MS Contin 15 mg 1 p.o. t.i.d. 90 with no refill. 3. Follow up with me in 1 month.  Her questions were encouraged and     answered.     [name]     ______________________________ Erick Colace, M.D.   RLW/MedQ D:  03/01/2011 15:12:01  T:  03/01/2011 23:41:24  Job #:  [id number]

## 2011-03-07 ENCOUNTER — Other Ambulatory Visit: Payer: Self-pay | Admitting: *Deleted

## 2011-03-07 MED ORDER — DEXLANSOPRAZOLE 60 MG PO CPDR: 60.0000 mg | DELAYED_RELEASE_CAPSULE | Freq: Every day | ORAL | Status: DC

## 2011-03-07 MED ORDER — MIRTAZAPINE 15 MG PO TABS: 15.0000 mg | ORAL_TABLET | Freq: Every day | ORAL | Status: DC

## 2011-03-17 LAB — CBC
HCT: 36.5
Hemoglobin: 12.5
MCHC: 34.2
MCV: 92.3
Platelets: 281
RBC: 3.96
RDW: 12.4
WBC: 6.8

## 2011-03-17 LAB — BASIC METABOLIC PANEL
BUN: 12
CO2: 30
Calcium: 9.1
Chloride: 104
Creatinine, Ser: 0.77
GFR calc Af Amer: >60
GFR calc non Af Amer: >60
Glucose, Bld: 102 — ABNORMAL HIGH
Potassium: 3.7
Sodium: 140

## 2011-03-30 ENCOUNTER — Encounter: Payer: Medicare Other | Attending: Neurosurgery | Admitting: Neurosurgery

## 2011-03-30 DIAGNOSIS — IMO0002 Reserved for concepts with insufficient information to code with codable children: Secondary | ICD-10-CM

## 2011-03-30 DIAGNOSIS — M62838 Other muscle spasm: Secondary | ICD-10-CM | POA: Insufficient documentation

## 2011-03-30 DIAGNOSIS — R209 Unspecified disturbances of skin sensation: Secondary | ICD-10-CM | POA: Insufficient documentation

## 2011-03-30 DIAGNOSIS — M546 Pain in thoracic spine: Secondary | ICD-10-CM | POA: Insufficient documentation

## 2011-03-30 DIAGNOSIS — M961 Postlaminectomy syndrome, not elsewhere classified: Secondary | ICD-10-CM | POA: Insufficient documentation

## 2011-03-30 DIAGNOSIS — M25569 Pain in unspecified knee: Secondary | ICD-10-CM | POA: Insufficient documentation

## 2011-03-30 NOTE — Assessment & Plan Note (Signed)
This is a patient of Dr. Wynn Banker.  She is seen for thoracic degenerative pain as well as cervical post laminectomy syndrome.  She states she is actually doing little better.  She is not having change in her pain.  She rates it as a 3/4.  It is a burning to an aching pain. General activity level is 6-7.  Pain is worse during the day and night. Sleep patterns are fair.  Pain is worse walking, bending, sitting, standing, and activity.  Rest, medication, heat, ice, TENS unit injections help.  She does have some knee pain from time to time. Mobility, she walks without assistance.  She can climb steps and drive. She can walk about 10 minutes at a time.  She is on disability.  She needs some help with household duties, meal prep, and shopping.  REVIEW OF SYSTEMS:  Notable for difficulties described above as well as some weakness, numbness, tingling, spasms, depression, anxiety, suicidal thoughts or aberrant behaviors.  Pill counts correct. Last UDS was consistent.  She does have some problems with low blood sugar, easy bleeding, night sweats, weight gain.  PAST MEDICAL HISTORY/SOCIAL HISTORY/FAMILY HISTORY:  Unchanged.  PHYSICAL EXAMINATION:  Her blood pressure is 162/55, pulse 71, respiration 14, O2 sats 97 room air.  Her motor strength is 5/5 in lower extremities.  Sensation is intact in the upper and lower extremities. Constitutionally she is obese.  She is alert and oriented x3.  She has a little bit of shuffled limp to her gait.  ASSESSMENT: 1. Thoracic degenerative disc disease. 2. History of post laminectomy syndrome cervical.  PLAN:  We will refilled the MS Contin 15 mg 1 p.o. t.i.d., #90 with no refill.  She will continue on the Mobic if she needs to.  We will see her back in a month.  Her questions were encouraged and answered.     [name] Electronically Signed    RLW/MedQ D:  03/30/2011 13:44:17  T:  03/30/2011 20:34:37  Job #:  [id number]

## 2011-04-28 ENCOUNTER — Other Ambulatory Visit (INDEPENDENT_AMBULATORY_CARE_PROVIDER_SITE_OTHER): Payer: Medicare Other

## 2011-04-28 DIAGNOSIS — I10 Essential (primary) hypertension: Secondary | ICD-10-CM

## 2011-04-28 DIAGNOSIS — E7800 Pure hypercholesterolemia, unspecified: Secondary | ICD-10-CM

## 2011-04-28 DIAGNOSIS — E538 Deficiency of other specified B group vitamins: Secondary | ICD-10-CM

## 2011-04-28 DIAGNOSIS — R739 Hyperglycemia, unspecified: Secondary | ICD-10-CM

## 2011-04-28 DIAGNOSIS — R7309 Other abnormal glucose: Secondary | ICD-10-CM

## 2011-04-28 LAB — TSH: TSH: 12.85 u[IU]/mL — ABNORMAL HIGH (ref 0.35–5.50)

## 2011-04-28 LAB — LIPID PANEL
Cholesterol: 191 mg/dL (ref 0–200)
HDL: 37.2 mg/dL — ABNORMAL LOW (ref >39.00)
LDL Cholesterol: 116 mg/dL — ABNORMAL HIGH (ref 0–99)
Total CHOL/HDL Ratio: 5
Triglycerides: 190 mg/dL — ABNORMAL HIGH (ref 0.0–149.0)
VLDL: 38 mg/dL (ref 0.0–40.0)

## 2011-04-28 LAB — COMPREHENSIVE METABOLIC PANEL
ALT: 35 U/L (ref 0–35)
AST: 41 U/L — ABNORMAL HIGH (ref 0–37)
Albumin: 4 g/dL (ref 3.5–5.2)
Alkaline Phosphatase: 119 U/L — ABNORMAL HIGH (ref 39–117)
BUN: 15 mg/dL (ref 6–23)
CO2: 29 mEq/L (ref 19–32)
Calcium: 9.4 mg/dL (ref 8.4–10.5)
Chloride: 102 mEq/L (ref 96–112)
Creatinine, Ser: 0.9 mg/dL (ref 0.4–1.2)
GFR: 64.67 mL/min (ref >60.00)
Glucose, Bld: 121 mg/dL — ABNORMAL HIGH (ref 70–99)
Potassium: 3.9 mEq/L (ref 3.5–5.1)
Sodium: 141 mEq/L (ref 135–145)
Total Bilirubin: 0.6 mg/dL (ref 0.3–1.2)
Total Protein: 7.6 g/dL (ref 6.0–8.3)

## 2011-04-28 LAB — CBC WITH DIFFERENTIAL/PLATELET
Basophils Absolute: 0 10*3/uL (ref 0.0–0.1)
Basophils Relative: 0.4 % (ref 0.0–3.0)
Eosinophils Absolute: 0.2 10*3/uL (ref 0.0–0.7)
Eosinophils Relative: 2.3 % (ref 0.0–5.0)
HCT: 37.8 % (ref 36.0–46.0)
Hemoglobin: 13.1 g/dL (ref 12.0–15.0)
Lymphocytes Relative: 34.4 % (ref 12.0–46.0)
Lymphs Abs: 2.5 10*3/uL (ref 0.7–4.0)
MCHC: 34.6 g/dL (ref 30.0–36.0)
MCV: 89.8 fl (ref 78.0–100.0)
Monocytes Absolute: 0.4 10*3/uL (ref 0.1–1.0)
Monocytes Relative: 5.9 % (ref 3.0–12.0)
Neutro Abs: 4.1 10*3/uL (ref 1.4–7.7)
Neutrophils Relative %: 57 % (ref 43.0–77.0)
Platelets: 293 10*3/uL (ref 150.0–400.0)
RBC: 4.22 Mil/uL (ref 3.87–5.11)
RDW: 12.9 % (ref 11.5–14.6)
WBC: 7.3 10*3/uL (ref 4.5–10.5)

## 2011-04-28 LAB — HEMOGLOBIN A1C: Hgb A1c MFr Bld: 6.6 % — ABNORMAL HIGH (ref 4.6–6.5)

## 2011-04-28 LAB — VITAMIN B12: Vitamin B-12: 220 pg/mL (ref 211–911)

## 2011-04-29 LAB — VITAMIN D 25 HYDROXY (VIT D DEFICIENCY, FRACTURES): Vit D, 25-Hydroxy: 44 ng/mL (ref 30–89)

## 2011-05-02 ENCOUNTER — Ambulatory Visit: Payer: Medicare Other | Admitting: Physical Medicine & Rehabilitation

## 2011-05-02 ENCOUNTER — Encounter: Payer: Medicare Other | Attending: Neurosurgery

## 2011-05-02 DIAGNOSIS — M961 Postlaminectomy syndrome, not elsewhere classified: Secondary | ICD-10-CM | POA: Insufficient documentation

## 2011-05-02 DIAGNOSIS — M25569 Pain in unspecified knee: Secondary | ICD-10-CM

## 2011-05-02 DIAGNOSIS — IMO0002 Reserved for concepts with insufficient information to code with codable children: Secondary | ICD-10-CM | POA: Insufficient documentation

## 2011-05-02 NOTE — Assessment & Plan Note (Signed)
REASON FOR VISIT:  Back pain, knee pain.  HISTORY:  A 60 year old female with history of cervical post laminectomy syndrome, also thoracic degenerative disk.  She has overlying diagnosis of fibromyalgia syndrome.  In addition, she has had knee pain,  never has had x-rays.  Has not really tried any therapy or any other kind of specific treatment.  She has not had any falls, any trauma to the area. Her pain is in the 4-5 out of 10 range with medications with activity 7/10.  Walking tolerance is 10 minutes.  She can climb steps holding a rail.  Her review of systems positive for tingling, spasms, depression, limb swelling occasionally in her knees if she walks a lot.  She needs assist with meal prep, household duties, and shopping.  Her pain is improved with rest, heat, pacing activities; medication, TENs, injections, and worsens with walking, bending, sitting, standing.  SOCIAL HISTORY:  Married, lives with her husband.  FAMILY HISTORY:  Heart disease, lung disease, diabetes, hypertension.  OBJECTIVE:  VITAL SIGNS:  Blood pressure 133/62, pulse 71, respirations 16, O2 sat 99% on room air. General:  No acute distress.  Mood and affect appropriate. MUSCULOSKELETAL:  Gait is without any toe drag or knee instability. Knee range of motion is full.  She has no evidence of ligamentous laxity.  No evidence of effusion, no tenderness over the patellar tendons over the medial joint line.  Her neck has good range of motion.  Upper extremity strength is normal. She has hyperactive reflexes, right upper extremity, biceps, triceps, brachioradialis 3+ and 2+ on the left.  IMPRESSION: 1. Cervical post laminectomy syndrome.  We will continue her MS Contin     15 mg t.i.d. 2. Knee pain bilateral.  This appears to be mainly activity related.     Exam is unremarkable.  We will check x-rays.  We will have her     trial some Voltaren gel for this. 3. She will see [facility] next  month.     Erick Colace, M.D. Electronically Signed    AEK/MedQ D:  05/02/2011 14:24:30  T:  05/02/2011 19:48:27  Job #:  [id number]  cc:   Dr. Jason Coop, MD Fax: [phone]

## 2011-05-04 ENCOUNTER — Ambulatory Visit (INDEPENDENT_AMBULATORY_CARE_PROVIDER_SITE_OTHER): Payer: Medicare Other | Admitting: Family Medicine

## 2011-05-04 ENCOUNTER — Telehealth: Payer: Self-pay | Admitting: *Deleted

## 2011-05-04 ENCOUNTER — Encounter: Payer: Self-pay | Admitting: Family Medicine

## 2011-05-04 VITALS — BP 110/70 | HR 70 | Temp 98.8°F | Ht 66.0 in | Wt 238.8 lb

## 2011-05-04 DIAGNOSIS — F3289 Other specified depressive episodes: Secondary | ICD-10-CM

## 2011-05-04 DIAGNOSIS — E782 Mixed hyperlipidemia: Secondary | ICD-10-CM

## 2011-05-04 DIAGNOSIS — IMO0001 Reserved for inherently not codable concepts without codable children: Secondary | ICD-10-CM

## 2011-05-04 DIAGNOSIS — I10 Essential (primary) hypertension: Secondary | ICD-10-CM

## 2011-05-04 DIAGNOSIS — Z0000 Encounter for general adult medical examination without abnormal findings: Secondary | ICD-10-CM

## 2011-05-04 DIAGNOSIS — E039 Hypothyroidism, unspecified: Secondary | ICD-10-CM

## 2011-05-04 DIAGNOSIS — R7401 Elevation of levels of liver transaminase levels: Secondary | ICD-10-CM

## 2011-05-04 DIAGNOSIS — J309 Allergic rhinitis, unspecified: Secondary | ICD-10-CM

## 2011-05-04 DIAGNOSIS — F329 Major depressive disorder, single episode, unspecified: Secondary | ICD-10-CM

## 2011-05-04 DIAGNOSIS — E538 Deficiency of other specified B group vitamins: Secondary | ICD-10-CM

## 2011-05-04 MED ORDER — FLUTICASONE PROPIONATE 50 MCG/ACT NA SUSP: 2.0000 | Freq: Every day | NASAL | Status: DC

## 2011-05-04 MED ORDER — LEVOTHYROXINE SODIUM 50 MCG PO TABS: 50.0000 ug | ORAL_TABLET | Freq: Every day | ORAL | Status: DC

## 2011-05-04 MED ORDER — PANTOPRAZOLE SODIUM 40 MG PO TBEC: 40.0000 mg | DELAYED_RELEASE_TABLET | Freq: Every day | ORAL | Status: DC

## 2011-05-04 NOTE — Progress Notes (Signed)
Subjective:    Patient ID: [name], female    DOB: Feb 19, 1951, 60 y.o.   MRN: [id number]  HPI  [name], a 60 y.o. female presents today in the office for the following:    Medicare Wellness Exam and f/u medical problems:  Health Maintenance Summary Reviewed and updated, unless pt declines services.  Tobacco History Reviewed. Non-smoker Alcohol: No concerns, no excessive use Exercise Habits: Some activity, rec at least 30 mins 5 times a week STD concerns: none Drug Use: None Birth control method: hysterectomy Menses regular: n/a Lumps or breast concerns: no  Health Maintenance  Topic Date Due  . Influenza Vaccine  03/27/2012  . Mammogram  01/18/2013  . Tetanus/tdap  06/27/2013  . Colonoscopy  10/19/2016    Labs reviewed with the patient.   Lipids:    Component Value Date/Time   CHOL 191 04/28/2011 1031   TRIG 190.0* 04/28/2011 1031   HDL 37.20* 04/28/2011 1031   LDLDIRECT 161.2 02/02/2010 1026   VLDL 38.0 04/28/2011 1031   CHOLHDL 5 04/28/2011 1031    CBC:    Component Value Date/Time   WBC 7.3 04/28/2011 1031   HGB 13.1 04/28/2011 1031   HCT 37.8 04/28/2011 1031   PLT 293.0 04/28/2011 1031   MCV 89.8 04/28/2011 1031   NEUTROABS 4.1 04/28/2011 1031   LYMPHSABS 2.5 04/28/2011 1031   MONOABS 0.4 04/28/2011 1031   EOSABS 0.2 04/28/2011 1031   BASOSABS 0.0 04/28/2011 1031    Basic Metabolic Panel:    Component Value Date/Time   NA 141 04/28/2011 1031   K 3.9 04/28/2011 1031   CL 102 04/28/2011 1031   CO2 29 04/28/2011 1031   BUN 15 04/28/2011 1031   CREATININE 0.9 04/28/2011 1031   GLUCOSE 121* 04/28/2011 1031   CALCIUM 9.4 04/28/2011 1031    Lab Results  Component Value Date   ALT 35 04/28/2011   AST 41* 04/28/2011   ALKPHOS 119* 04/28/2011   BILITOT 0.6 04/28/2011    Hypothyroid: TSH 12.  TSH is 12. She is not on any thyroid supplementation currently. She has had some weight gain, she is fatigued. She also does have a significant history of  fibromyalgia  Hyperlipidemia: lipids are significantly improved compared to her last lab sets. Just been working on her diet. Minimal exercise. She has not been able to tolerate any cholesterol medicines in the past.  HTN: blood pressure is stable at goal.  GERD: she's been having some breakthrough reflux symptoms despite being on scheduled Dexilant  Fibromyalgia: she is not exercising. Her pain has been managed very well with some MS Contin, and she routinely sees Dr. Wynn Banker  dexilant is not working -- having to chew antacids. Feels like trapped gas. Prilosec, prevacid, nexium, aciphex. Aciphex was working.   Allergic Rhinitis: Almost a constant runny nose and headache. Stopped zyrtec - and using the generic.   Patient Active Problem List  Diagnoses  . HYPOTHYROIDISM  . Other B-Complex Deficiencies  . DEFICIENCY, VITAMIN D NOS  . HYPERLIPIDEMIA  . DEPRESSION  . HYPERTENSION  . ALLERGIC RHINITIS  . GERD  . FIBROMYALGIA  . FATIGUE  . TRANSAMINASES, SERUM, ELEVATED   Past Medical History  Diagnosis Date  . GERD (gastroesophageal reflux disease)   . Unspecified essential hypertension   . Fibromyalgia   . B12 deficiency   . Depression   . Irritable bowel syndrome   . Hyperlipemia   . Migraines   . HYPOTHYROIDISM 05/29/2007   Past  Surgical History  Procedure Date  . Appendectomy   . Cholecystectomy   . Tubal ligation   . Tonsillectomy and adenoidectomy    History  Substance Use Topics  . Smoking status: Never Smoker   . Smokeless tobacco: Never Used  . Alcohol Use: No   Family History  Problem Relation Age of Onset  . Breast cancer Mother   . Diabetes Mother   . Heart disease Mother   . Diabetes Father   . Heart disease Father   . Stomach cancer Maternal Uncle   . Breast cancer Paternal Aunt   . Colon cancer Neg Hx    Allergies  Allergen Reactions  . Clarithromycin   . Cyclobenzaprine Hcl     REACTION: THROAT CLOSED,TOUNGE SWELLING  . Doxycycline    . Duloxetine   . Metronidazole   . Naproxen Sodium   . Nsaids     REACTION: due to liver damage  . Oxycodone Hcl   . Penicillins   . Rizatriptan Benzoate   . Rosuvastatin     REACTION: MUSCLE ACHES  . Sumatriptan   . Tramadol   . Venlafaxine    Current Outpatient Prescriptions on File Prior to Visit  Medication Sig Dispense Refill  . aspirin 81 MG tablet Take 81 mg by mouth daily.        . Cholecalciferol (VITAMIN D) 2000 UNITS tablet Take 2,000 Units by mouth daily.        . citalopram (CELEXA) 40 MG tablet Take 1 tablet (40 mg total) by mouth daily.  90 tablet  2  . Coenzyme Q10 (COQ-10) 50 MG CAPS Take 1 capsule by mouth 2 (two) times daily.        Marland Kitchen docusate sodium (COLACE) 100 MG capsule Take 100 mg by mouth 3 (three) times daily as needed.        . gabapentin (NEURONTIN) 600 MG tablet Take 600 mg by mouth 3 (three) times daily.        Marland Kitchen MAGNESIUM GLYCINATE PLUS PO Take 200 mg by mouth. Take 2 tablets at bedtime        . Melaton-Thean-Cham-PassF-LBalm (MELATONIN + L-THEANINE PO) Take by mouth at bedtime. 3mg /25mg  tablet       . mirtazapine (REMERON) 15 MG tablet Take 1 tablet (15 mg total) by mouth at bedtime.  90 tablet  3  . morphine (MS CONTIN) 15 MG 12 hr tablet Take 15 mg by mouth 3 (three) times daily.        . sennosides-docusate sodium (SENOKOT-S) 8.6-50 MG tablet Take 3 tablets by mouth at bedtime.        . Glucosamine HCl 1000 MG TABS Take 1 tablet by mouth 2 (two) times daily.        Marland Kitchen GLUCOSAMINE-FISH OIL-EPA-DHA PO Take 1 capsule by mouth 3 (three) times daily.           Review of Systems General: Denies fever, chills, sweats. FATIGUE Eyes: Denies blurring,significant itching ENT: Denies earache, sore throat, and hoarseness. RUNNY NOSE Cardiovascular: Denies chest pains, palpitations, dyspnea on exertion,  Respiratory: Denies cough, dyspnea at rest,wheeezing Breast: no concerns about lumps GI: Denies nausea, vomiting, diarrhea, constipation, change in bowel  habits, abdominal pain, melena, hematochezia. GERD SX GU: Denies dysuria, hematuria, urinary hesitancy, nocturia, denies STD risk, no concerns about discharge Musculoskeletal: Denies back pain, C/W B KNEE PAIN Derm: Denies rash, itching Neuro: Denies  paresthesias, frequent falls, frequent headaches Psych: Denies depression, anxiety Endocrine: Denies cold intolerance, heat intolerance, polydipsia Heme:  Denies enlarged lymph nodes Allergy: No hayfever     Objective:   Physical Exam   Physical Exam  Blood pressure 110/70, pulse 70, temperature 98.8 F (37.1 C), temperature source Oral, height 5\' 6"  (1.676 m), weight 238 lb 12 oz (108.296 kg).  GEN: well developed, well nourished, no acute distress Eyes: conjunctiva and lids normal, PERRLA, EOMI ENT: TM clear, nares clear, oral exam WNL Neck: supple, no lymphadenopathy, no thyromegaly, no JVD Pulm: clear to auscultation and percussion, respiratory effort normal CV: regular rate and rhythm, S1-S2, no murmur, rub or gallop, no bruits, peripheral pulses normal and symmetric, no cyanosis, clubbing, edema or varicosities Chest: no scars, masses, no gynecomastia   BREAST: defer (sees GYN) GI: soft, non-tender; no hepatosplenomegaly, masses; active bowel sounds all quadrants GU: defer Lymph: no cervical, axillary or inguinal adenopathy MSK: gait normal, muscle tone and strength WNL, no joint swelling, effusions, discoloration, crepitus  Bilateral knees with full extension, flexion to be on 120. There is some significant crepitus. Negative Lachman. MCL and LCL are stable. Negative McMurray's test. Negative bounce home tests and no effusion.  SKIN: clear, good turgor, color WNL, no rashes, lesions, or ulcerations Neuro: normal mental status, normal strength, sensation, and motion Psych: alert; oriented to person, place and time, normally interactive and not anxious or depressed in appearance.      Assessment & Plan:   1. Routine  general medical examination at a health care facility    2. HYPOTHYROIDISM : recheck in  mo TSH, T4, free, levothyroxine (SYNTHROID, LEVOTHROID) 50 MCG tablet, DISCONTINUED: levothyroxine (SYNTHROID, LEVOTHROID) 50 MCG tablet  3. HYPERLIPIDEMIA : improved with diet management   4. DEPRESSION stable   5. HYPERTENSION stable   6. Other B-complex deficiencies stable   7. ALLERGIC RHINITIS : rx flonase   8. FIBROMYALGIA doing well   9. TRANSAMINASES, SERUM, ELEVATED - stable     The patient's preventative maintenance and recommended screening tests for an annual wellness exam were reviewed in full today. Brought up to date unless services declined.  Counselled on the importance of diet, exercise, and its role in overall health and mortality. The patient's FH and SH was reviewed, including their home life, tobacco status, and drug and alcohol status.   I have personally reviewed the Medicare Annual Wellness questionnaire and have noted 1. The patient's medical and social history 2. Their use of alcohol, tobacco or illicit drugs 3. Their current medications and supplements 4. The patient's functional ability including ADL's, fall risks, home safety risks and hearing or visual             impairment. 5. Diet and physical activities 6. Evidence for depression or mood disorders  The patients weight, height, BMI and visual acuity have been recorded in the chart I have made referrals, counseling and provided education to the patient based review of the above and I have provided the pt with a written personalized care plan for preventive services.  I have provided the patient with a copy of your personalized plan for preventive services. Instructed to take the time to review along with their updated medication list.

## 2011-05-04 NOTE — Progress Notes (Signed)
Rx's for Levothyroxine and Protonix called at [facility] and sent to [facility] per Dr. Cyndie Chime request.

## 2011-05-04 NOTE — Telephone Encounter (Signed)
i forgot. Done.

## 2011-05-04 NOTE — Patient Instructions (Signed)
Recheck thyroid at lab in 6 weeks, f/u with Dr. Salena Saner in 1 week after that.

## 2011-05-04 NOTE — Telephone Encounter (Signed)
Pt was seen earlier and thought that a nasal spray would be called in for her to [facility], but that hasnt been done.  She thinks you might have forgotten.  I advised her to check with the pharmacy later this evening.

## 2011-05-09 ENCOUNTER — Encounter: Payer: Self-pay | Admitting: Family Medicine

## 2011-06-01 ENCOUNTER — Encounter: Payer: Medicare Other | Attending: Neurosurgery | Admitting: Neurosurgery

## 2011-06-01 DIAGNOSIS — M961 Postlaminectomy syndrome, not elsewhere classified: Secondary | ICD-10-CM | POA: Insufficient documentation

## 2011-06-01 DIAGNOSIS — M25569 Pain in unspecified knee: Secondary | ICD-10-CM | POA: Insufficient documentation

## 2011-06-01 DIAGNOSIS — E669 Obesity, unspecified: Secondary | ICD-10-CM | POA: Insufficient documentation

## 2011-06-01 NOTE — Assessment & Plan Note (Signed)
This is a patient of Dr. Wynn Banker seen for cervical laminectomy syndrome as well as generalized low back pain and knee pain.  She reports no change in her pain at 5-6.  It is a burning, tingling, aching pain.  General activity level is 7-8.  Pain is worse in the morning, evening, and at night.  Sleep patterns are fair.  Pain is worse with all activity.  Rest, heat, pacing, medication, TENS, and injections help. She walks without assistance.  She walks about 10 minutes at a time. She can climb steps and drive.  She is on disability, needs some help with household duties and meal prep.  REVIEW OF SYSTEMS:  Notable for difficulties described above as well as some weight gain, limb swelling, spasms, depression.  No suicidal thoughts or aberrant behaviors.  Last pill count and UDS consistent.  Past medical history, social history, and family history unchanged.  PHYSICAL EXAMINATION:  VITAL SIGNS:  Her blood pressure is 144/45, pulse 72, respirations 16, O2 sats 97 on room air. NEUROLOGIC:  Motor strength and sensation are intact. CONSTITUTIONAL:  She is obese.  She is alert and oriented x3.  She has a normal gait.  IMPRESSION: 1. History of cervical laminectomy syndrome. 2. Bilateral knee pain, resolved.  PLAN: 1. She will continue to use her Voltaren gel as needed. 2. Refill MS Contin 15 mg 1 p.o. t.i.d., 90 with no refill. 3. She did not obtain the bilateral knee x-rays that Dr. Wynn Banker     ordered as she states that her knees are doing better. 4. She will follow up here in 1 month.  Her questions were encouraged     and answered.     Vivianna Piccini L. Blima Dessert Electronically Signed    RLW/MedQ D:  06/01/2011 13:59:59  T:  06/01/2011 23:53:46  Job #:  [id number]

## 2011-06-15 ENCOUNTER — Encounter: Payer: Self-pay | Admitting: Family Medicine

## 2011-06-15 ENCOUNTER — Ambulatory Visit (INDEPENDENT_AMBULATORY_CARE_PROVIDER_SITE_OTHER): Payer: Medicare Other | Admitting: Family Medicine

## 2011-06-15 VITALS — BP 130/70 | HR 85 | Temp 98.7°F | Ht 66.0 in | Wt 235.8 lb

## 2011-06-15 DIAGNOSIS — J029 Acute pharyngitis, unspecified: Secondary | ICD-10-CM

## 2011-06-15 DIAGNOSIS — E039 Hypothyroidism, unspecified: Secondary | ICD-10-CM

## 2011-06-15 DIAGNOSIS — K219 Gastro-esophageal reflux disease without esophagitis: Secondary | ICD-10-CM

## 2011-06-15 LAB — POCT RAPID STREP A (OFFICE): Rapid Strep A Screen: NEGATIVE

## 2011-06-15 MED ORDER — RABEPRAZOLE SODIUM 20 MG PO TBEC: 20.0000 mg | DELAYED_RELEASE_TABLET | Freq: Every day | ORAL | Status: DC

## 2011-06-15 NOTE — Patient Instructions (Signed)
SORE THROAT -Most caused by infections, 80-85% are viral injections.  -Strep throat is bacterial and requires antibiotics -Drainage and cough can irritate throat  TREATMENT 1. Warm liquids, salt water gargles to help with the sore throat. 2. Chloraseptic as needed can help a lot 3. Cough drops, popsicles, or hard candy 4. Liquids - drink plenty, without caffeine 5. Salt water gargle: 1/2 tsp salt in 1/2 glass warm water 6. Avoid spicy food 7. Get plenty of sleep 8. Ice chips for comfort  

## 2011-06-15 NOTE — Progress Notes (Signed)
  Patient Name: [name] Date of Birth: 04/27/1951 Age: 60 y.o. Medical Record Number: [id number] Gender: female  History of Present Illness:  [name] is a 60 y.o. very pleasant female patient who presents with the following:  Sore throat --- perpetual runny nose, some drainage. Went to [employer] of thanksgiving. Did not have strep then. Now back. Minimal global symptoms.  Now taking thyroid medicine, sweats is improved. Energy is better. Overall, feels much better  Lab Results  Component Value Date   TSH 12.85* 04/28/2011   GERD sx have improved taking kapidex and then protonix in the PM. Wonders if aciphex may work better, since it worked very well in the past  Past Medical History, Surgical History, Social History, Family History, and Problem List have been reviewed in EHR and updated if relevant.  Review of Systems:  GEN: No acute illnesses, no fevers, chills. GI: No n/v/d, eating normally Pulm: No SOB Interactive and getting along well at home.  Otherwise, ROS is as per the HPI.   Physical Examination: Filed Vitals:   06/15/11 1603  BP: 130/70  Pulse: 85  Temp: 98.7 F (37.1 C)  TempSrc: Oral  Height: 5\' 6"  (1.676 m)  Weight: 235 lb 12.8 oz (106.958 kg)  SpO2: 98%    Body mass index is 38.06 kg/(m^2).  GEN: WDWN, NAD, Non-toxic, A & O x 3 HEENT: Atraumatic, Normocephalic. Neck supple. No masses, No LAD. Ears and Nose: No external deformity. CV: RRR, No M/G/R. No JVD. No thrill. No extra heart sounds. PULM: CTA B, no wheezes, crackles, rhonchi. No retractions. No resp. distress. No accessory muscle use. ABD: S, NT, ND, +BS. No rebound. No HSM. EXTR: No c/c/e NEURO Normal gait.  PSYCH: Normally interactive. Conversant. Not depressed or anxious appearing.  Calm demeanor.    Assessment and Plan: 1. Sore throat  POCT rapid strep A, POCT rapid strep A  2. HYPOTHYROIDISM  TSH, T4, free  3. GERD  RABEprazole (ACIPHEX) 20 MG tablet    Viral  pharyngitis, supportive care. Strep neg  Change to aciphex Check thyroid after initial dosing

## 2011-06-16 LAB — TSH: TSH: 3.77 u[IU]/mL (ref 0.35–5.50)

## 2011-06-16 LAB — T4, FREE: Free T4: 0.82 ng/dL (ref 0.60–1.60)

## 2011-06-17 ENCOUNTER — Encounter: Payer: Self-pay | Admitting: *Deleted

## 2011-06-30 ENCOUNTER — Encounter: Payer: Medicare Other | Admitting: Neurosurgery

## 2011-07-19 ENCOUNTER — Other Ambulatory Visit: Payer: Self-pay | Admitting: *Deleted

## 2011-07-19 MED ORDER — CITALOPRAM HYDROBROMIDE 40 MG PO TABS: 40.0000 mg | ORAL_TABLET | Freq: Every day | ORAL | Status: DC

## 2011-07-28 ENCOUNTER — Encounter: Payer: Medicare Other | Attending: Neurosurgery | Admitting: Neurosurgery

## 2011-07-28 DIAGNOSIS — IMO0001 Reserved for inherently not codable concepts without codable children: Secondary | ICD-10-CM | POA: Insufficient documentation

## 2011-07-28 DIAGNOSIS — M961 Postlaminectomy syndrome, not elsewhere classified: Secondary | ICD-10-CM | POA: Insufficient documentation

## 2011-07-28 DIAGNOSIS — M25569 Pain in unspecified knee: Secondary | ICD-10-CM | POA: Insufficient documentation

## 2011-07-29 NOTE — Assessment & Plan Note (Signed)
This is a patient of Dr. Wynn Banker seen with cervical postlaminectomy syndrome with fibromyalgia.  She rates her pain at a 3-4, activity level of 6-7.  Pain is worse in the evening.  Sleep patterns are fair.  All activities are aggravated.  Ice, rest, heat, pacing medication, TENS unit, and injections help.  She walks without assistance 10 minutes at a time.  She can climb steps and drive.  Functionally, she is on disability and needs some help with household duties.  REVIEW OF SYSTEMS:  Notable for difficulties described above as well as weakness, tingling, or depression.  No suicidal thoughts or aberrant behaviors.  Last pill counts and UDS consistent.  PAST MEDICAL HISTORY:  Unchanged.  SOCIAL HISTORY:  Unchanged.  FAMILY HISTORY:  Unchanged.  PHYSICAL EXAMINATION:  VITAL SIGNS:  Her blood pressure is 152/58, pulse 65, respirations 16, O2 sats 97 on room air.  NEUROLOGIC:  Motor strength and sensation are intact.  Constitutionally, she is obese.  She is alert and oriented x3.  She has normal gait.  IMPRESSION: 1. History of cervical postlaminectomy syndrome. 2. Bilateral knee pain, transient, fairly resolved.  PLAN:  Refill MS Contin 15 mg one p.o. t.i.d., 90 with no refills. Questions were encouraged and answered.  She will follow up here in a month.     [name] L. Blima Dessert Electronically Signed    RLW/MedQ D:  07/28/2011 14:20:40  T:  07/28/2011 16:10:96  Job #:  [id number]

## 2011-08-29 ENCOUNTER — Encounter: Payer: Self-pay | Admitting: Physical Medicine & Rehabilitation

## 2011-08-29 ENCOUNTER — Encounter: Payer: Medicare Other | Attending: Neurosurgery

## 2011-08-29 ENCOUNTER — Ambulatory Visit (HOSPITAL_BASED_OUTPATIENT_CLINIC_OR_DEPARTMENT_OTHER): Payer: Medicare Other | Admitting: Physical Medicine & Rehabilitation

## 2011-08-29 DIAGNOSIS — M25569 Pain in unspecified knee: Secondary | ICD-10-CM | POA: Insufficient documentation

## 2011-08-29 DIAGNOSIS — E669 Obesity, unspecified: Secondary | ICD-10-CM | POA: Insufficient documentation

## 2011-08-29 DIAGNOSIS — M961 Postlaminectomy syndrome, not elsewhere classified: Secondary | ICD-10-CM | POA: Insufficient documentation

## 2011-08-29 DIAGNOSIS — IMO0002 Reserved for concepts with insufficient information to code with codable children: Secondary | ICD-10-CM | POA: Insufficient documentation

## 2011-08-29 MED ORDER — MORPHINE SULFATE CR 15 MG PO TB12: 15.0000 mg | ORAL_TABLET | Freq: Three times a day (TID) | ORAL | Status: DC
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
## 2011-08-29 NOTE — Patient Instructions (Signed)
Knee Exercises EXERCISES RANGE OF MOTION(ROM) AND STRETCHING EXERCISES These exercises may help you when beginning to rehabilitate your injury. Your symptoms may resolve with or without further involvement from your physician, physical therapist or athletic trainer. While completing these exercises, remember:   Restoring tissue flexibility helps normal motion to return to the joints. This allows healthier, less painful movement and activity.   An effective stretch should be held for at least 30 seconds.   A stretch should never be painful. You should only feel a gentle lengthening or release in the stretched tissue.  STRETCH - Knee Extension, Prone  Lie on your stomach on a firm surface, such as a bed or countertop. Place your right / left knee and leg just beyond the edge of the surface. You may wish to place a towel under the far end of your right / left thigh for comfort.   Relax your leg muscles and allow gravity to straighten your knee. Your clinician may advise you to add an ankle weight if more resistance is helpful for you.   You should feel a stretch in the back of your right / left knee. Hold this position for __________ seconds.  Repeat __________ times. Complete this stretch __________ times per day. * Your physician, physical therapist or athletic trainer may ask you to add ankle weight to enhance your stretch.  RANGE OF MOTION - Knee Flexion, Active  Lie on your back with both knees straight. (If this causes back discomfort, bend your opposite knee, placing your foot flat on the floor.)   Slowly slide your heel back toward your buttocks until you feel a gentle stretch in the front of your knee or thigh.   Hold for __________ seconds. Slowly slide your heel back to the starting position.  Repeat __________ times. Complete this exercise __________ times per day.  STRETCH - Quadriceps, Prone   Lie on your stomach on a firm surface, such as a bed or padded floor.   Bend your  right / left knee and grasp your ankle. If you are unable to reach, your ankle or pant leg, use a belt around your foot to lengthen your reach.   Gently pull your heel toward your buttocks. Your knee should not slide out to the side. You should feel a stretch in the front of your thigh and/or knee.   Hold this position for __________ seconds.  Repeat __________ times. Complete this stretch __________ times per day.  STRETCH - Hamstrings, Supine   Lie on your back. Loop a belt or towel over the ball of your right / left foot.   Straighten your right / left knee and slowly pull on the belt to raise your leg. Do not allow the right / left knee to bend. Keep your opposite leg flat on the floor.   Raise the leg until you feel a gentle stretch behind your right / left knee or thigh. Hold this position for __________ seconds.  Repeat __________ times. Complete this stretch __________ times per day.  STRENGTHENING EXERCISES These exercises may help you when beginning to rehabilitate your injury. They may resolve your symptoms with or without further involvement from your physician, physical therapist or athletic trainer. While completing these exercises, remember:   Muscles can gain both the endurance and the strength needed for everyday activities through controlled exercises.   Complete these exercises as instructed by your physician, physical therapist or athletic trainer. Progress the resistance and repetitions only as guided.  You may experience muscle soreness or fatigue, but the pain or discomfort you are trying to eliminate should never worsen during these exercises. If this pain does worsen, stop and make certain you are following the directions exactly. If the pain is still present after adjustments, discontinue the exercise until you can discuss the trouble with your clinician.  STRENGTH - Quadriceps, Isometrics  Lie on your back with your right / left leg extended and your opposite knee  bent.   Gradually tense the muscles in the front of your right / left thigh. You should see either your knee cap slide up toward your hip or increased dimpling just above the knee. This motion will push the back of the knee down toward the floor/mat/bed on which you are lying.   Hold the muscle as tight as you can without increasing your pain for __________ seconds.   Relax the muscles slowly and completely in between each repetition.  Repeat __________ times. Complete this exercise __________ times per day.  STRENGTH - Quadriceps, Short Arcs   Lie on your back. Place a __________ inch towel roll under your knee so that the knee slightly bends.   Raise only your lower leg by tightening the muscles in the front of your thigh. Do not allow your thigh to rise.   Hold this position for __________ seconds.  Repeat __________ times. Complete this exercise __________ times per day.  OPTIONAL ANKLE WEIGHTS: Begin with ____________________, but DO NOT exceed ____________________. Increase in 1 pound/0.5 kilogram increments.  STRENGTH - Quadriceps, Straight Leg Raises  Quality counts! Watch for signs that the quadriceps muscle is working to insure you are strengthening the correct muscles and not "cheating" by substituting with healthier muscles.  Lay on your back with your right / left leg extended and your opposite knee bent.   Tense the muscles in the front of your right / left thigh. You should see either your knee cap slide up or increased dimpling just above the knee. Your thigh may even quiver.   Tighten these muscles even more and raise your leg 4 to 6 inches off the floor. Hold for __________ seconds.   Keeping these muscles tense, lower your leg.   Relax the muscles slowly and completely in between each repetition.  Repeat __________ times. Complete this exercise __________ times per day.  STRENGTH - Hamstring, Curls  Lay on your stomach with your legs extended. (If you lay on a bed,  your feet may hang over the edge.)   Tighten the muscles in the back of your thigh to bend your right / left knee up to 90 degrees. Keep your hips flat on the bed/floor.   Hold this position for __________ seconds.   Slowly lower your leg back to the starting position.  Repeat __________ times. Complete this exercise __________ times per day.  OPTIONAL ANKLE WEIGHTS: Begin with ____________________, but DO NOT exceed ____________________. Increase in 1 pound/0.5 kilogram increments.  STRENGTH - Quadriceps, Squats  Stand in a door frame so that your feet and knees are in line with the frame.   Use your hands for balance, not support, on the frame.   Slowly lower your weight, bending at the hips and knees. Keep your lower legs upright so that they are parallel with the door frame. Squat only within the range that does not increase your knee pain. Never let your hips drop below your knees.   Slowly return upright, pushing with your legs, not pulling with  your hands.  Repeat __________ times. Complete this exercise __________ times per day.  STRENGTH - Quadriceps, Wall Slides  Follow guidelines for form closely. Increased knee pain often results from poorly placed feet or knees.  Lean against a smooth wall or door and walk your feet out 18-24 inches. Place your feet hip-width apart.   Slowly slide down the wall or door until your knees bend __________ degrees.* Keep your knees over your heels, not your toes, and in line with your hips, not falling to either side.   Hold for __________ seconds. Stand up to rest for __________ seconds in between each repetition.  Repeat __________ times. Complete this exercise __________ times per day. * Your physician, physical therapist or athletic trainer will alter this angle based on your symptoms and progress. Document Released: 04/27/2005 Document Revised: 06/02/2011 Document Reviewed: 09/25/2008 [facility] Patient Information 2012 [place], [place].

## 2011-09-28 ENCOUNTER — Encounter: Payer: Medicare Other | Attending: Physical Medicine & Rehabilitation | Admitting: *Deleted

## 2011-09-28 VITALS — BP 143/58 | HR 72 | Resp 16 | Ht 66.0 in | Wt 232.4 lb

## 2011-09-28 DIAGNOSIS — IMO0002 Reserved for concepts with insufficient information to code with codable children: Secondary | ICD-10-CM

## 2011-09-28 DIAGNOSIS — IMO0001 Reserved for inherently not codable concepts without codable children: Secondary | ICD-10-CM

## 2011-09-28 DIAGNOSIS — M25569 Pain in unspecified knee: Secondary | ICD-10-CM | POA: Insufficient documentation

## 2011-09-28 DIAGNOSIS — M961 Postlaminectomy syndrome, not elsewhere classified: Secondary | ICD-10-CM | POA: Insufficient documentation

## 2011-09-28 MED ORDER — MORPHINE SULFATE CR 15 MG PO TB12: 15.0000 mg | ORAL_TABLET | Freq: Three times a day (TID) | ORAL | Status: DC

## 2011-09-28 NOTE — Progress Notes (Signed)
No changes since last visit. Here for refill.

## 2011-09-29 ENCOUNTER — Encounter: Payer: Self-pay | Admitting: Physical Medicine & Rehabilitation

## 2011-10-07 ENCOUNTER — Other Ambulatory Visit: Payer: Self-pay | Admitting: Family Medicine

## 2011-10-27 ENCOUNTER — Encounter: Payer: Self-pay | Admitting: *Deleted

## 2011-10-27 ENCOUNTER — Encounter: Payer: Medicare Other | Attending: Physical Medicine & Rehabilitation | Admitting: *Deleted

## 2011-10-27 VITALS — BP 131/61 | HR 70 | Resp 16 | Ht 66.0 in | Wt 234.0 lb

## 2011-10-27 DIAGNOSIS — M25569 Pain in unspecified knee: Secondary | ICD-10-CM

## 2011-10-27 DIAGNOSIS — M961 Postlaminectomy syndrome, not elsewhere classified: Secondary | ICD-10-CM

## 2011-10-27 DIAGNOSIS — IMO0001 Reserved for inherently not codable concepts without codable children: Secondary | ICD-10-CM

## 2011-10-27 DIAGNOSIS — IMO0002 Reserved for concepts with insufficient information to code with codable children: Secondary | ICD-10-CM

## 2011-10-27 MED ORDER — MORPHINE SULFATE CR 15 MG PO TB12: 15.0000 mg | ORAL_TABLET | Freq: Three times a day (TID) | ORAL | Status: DC

## 2011-10-27 NOTE — Progress Notes (Signed)
Pt is here today for a refill on her medication. She states no changes and is doing well. Advised pt to keep medication in a safe place.

## 2011-11-22 ENCOUNTER — Other Ambulatory Visit: Payer: Self-pay

## 2011-11-22 MED ORDER — GABAPENTIN 600 MG PO TABS: 600.0000 mg | ORAL_TABLET | Freq: Three times a day (TID) | ORAL | Status: DC

## 2011-11-28 ENCOUNTER — Ambulatory Visit (HOSPITAL_BASED_OUTPATIENT_CLINIC_OR_DEPARTMENT_OTHER): Payer: Medicare Other | Admitting: Physical Medicine & Rehabilitation

## 2011-11-28 ENCOUNTER — Encounter: Payer: Medicare Other | Attending: Neurosurgery

## 2011-11-28 ENCOUNTER — Encounter: Payer: Self-pay | Admitting: Physical Medicine & Rehabilitation

## 2011-11-28 VITALS — BP 137/44 | HR 71 | Ht 66.0 in | Wt 232.0 lb

## 2011-11-28 DIAGNOSIS — IMO0001 Reserved for inherently not codable concepts without codable children: Secondary | ICD-10-CM

## 2011-11-28 DIAGNOSIS — IMO0002 Reserved for concepts with insufficient information to code with codable children: Secondary | ICD-10-CM

## 2011-11-28 DIAGNOSIS — M961 Postlaminectomy syndrome, not elsewhere classified: Secondary | ICD-10-CM

## 2011-11-28 DIAGNOSIS — M25569 Pain in unspecified knee: Secondary | ICD-10-CM | POA: Insufficient documentation

## 2011-11-28 DIAGNOSIS — E669 Obesity, unspecified: Secondary | ICD-10-CM | POA: Insufficient documentation

## 2011-11-28 MED ORDER — MORPHINE SULFATE ER 15 MG PO TBCR: 15.0000 mg | EXTENDED_RELEASE_TABLET | Freq: Three times a day (TID) | ORAL | Status: DC

## 2011-11-28 NOTE — Patient Instructions (Signed)
I would recommend going to aquatic exercise program at the [facility] Next visit you will see our new physician assistant Clydie Braun

## 2011-11-28 NOTE — Progress Notes (Signed)
Subjective:    Patient ID: [name], female    DOB: [birth year], 61 y.o.   MRN: [id number]  HPI Last visit with M.D. In this clinic was in March. No new medical problems since that time. Knee pain has improved with exercise. No current exercise program. Has enjoyed aquatic exercise in the past. Pain Inventory Average Pain 4 Pain Right Now 4 My pain is constant, burning and aching  In the last 24 hours, has pain interfered with the following? General activity 4 Relation with others 5 Enjoyment of life 5 What TIME of day is your pain at its worst? evening and night Sleep (in general) Fair  Pain is worse with: walking, bending, sitting, standing and some activites Pain improves with: rest, heat/ice, pacing activities, medication, TENS and injections Relief from Meds: 6  Mobility walk without assistance ability to climb steps?  yes do you drive?  yes Do you have any goals in this area?  yes  Function disabled: date disabled 2009 I need assistance with the following:  meal prep, household duties and shopping Do you have any goals in this area?  yes  Neuro/Psych weakness numbness tingling depression  Prior Studies bone scan x-rays CT/MRI nerve study  Physicians involved in your care [place]   Family History  Problem Relation Age of Onset  . Breast cancer Mother   . Diabetes Mother   . Heart disease Mother   . Diabetes Father   . Heart disease Father   . Stomach cancer Maternal Uncle   . Breast cancer Paternal Aunt   . Colon cancer Neg Hx    History   Social History  . Marital Status: Married    Spouse Name: N/A    Number of Children: N/A  . Years of Education: N/A   Social History Main Topics  . Smoking status: Never Smoker   . Smokeless tobacco: Never Used  . Alcohol Use: No  . Drug Use: No  . Sexually Active: None   Other Topics Concern  . None   Social History Narrative  . None   Past Surgical History  Procedure Date  . Appendectomy    . Cholecystectomy   . Tubal ligation   . Tonsillectomy and adenoidectomy    Past Medical History  Diagnosis Date  . GERD (gastroesophageal reflux disease)   . Unspecified essential hypertension   . Fibromyalgia   . B12 deficiency   . Depression   . Irritable bowel syndrome   . Hyperlipemia   . Migraines   . HYPOTHYROIDISM 05/29/2007   BP 137/44  Pulse 71  Ht 5\' 6"  (1.676 m)  Wt 232 lb (105.235 kg)  BMI 37.45 kg/m2  SpO2 95%     Review of Systems  Constitutional: Positive for unexpected weight change.  Gastrointestinal: Positive for constipation.  Neurological: Positive for weakness and numbness.  All other systems reviewed and are negative.       Objective:   Physical Exam  Constitutional: She is oriented to person, place, and time. She appears well-developed.  Musculoskeletal:       Lumbar back: She exhibits tenderness. She exhibits normal range of motion, no bony tenderness, no swelling, no edema, no deformity and no spasm.       Lumbosacral junction paraspinal muscle tenderness  Neurological: She is alert and oriented to person, place, and time. She displays no atrophy. A sensory deficit is present. She exhibits normal muscle tone. Coordination and gait normal.  Reflex Scores:  Tricep reflexes are 2+ on the right side and 2+ on the left side.      Bicep reflexes are 2+ on the right side and 2+ on the left side.      Brachioradialis reflexes are 2+ on the right side and 2+ on the left side.      Patellar reflexes are 2+ on the right side and 2+ on the left side.      Achilles reflexes are 2+ on the right side and 2+ on the left side. Psychiatric: She has a normal mood and affect.          Assessment & Plan:  1. Cervical postlaminectomy syndrome. No evidence of radiculopathy. Doing well on the current pain medications with no signs of aberrant drug behavior. Will continue using with monthly visits. Will see the PA Continue MS Contin 3 times a  day Gabapentin 600 mg 3 times per day

## 2011-12-19 ENCOUNTER — Ambulatory Visit (INDEPENDENT_AMBULATORY_CARE_PROVIDER_SITE_OTHER): Payer: Medicare Other | Admitting: Family Medicine

## 2011-12-19 ENCOUNTER — Other Ambulatory Visit: Payer: Self-pay | Admitting: Family Medicine

## 2011-12-19 ENCOUNTER — Encounter: Payer: Self-pay | Admitting: Family Medicine

## 2011-12-19 VITALS — BP 130/70 | HR 87 | Temp 99.6°F | Ht 66.0 in | Wt 231.5 lb

## 2011-12-19 DIAGNOSIS — N63 Unspecified lump in unspecified breast: Secondary | ICD-10-CM

## 2011-12-19 NOTE — Progress Notes (Signed)
Nature conservation officer at [facility] [address] [place] [address] Phone: [phone] Fax: [phone]   Patient Name: [name] Date of Birth: 30-Jul-1950 Age: 61 y.o. Medical Record Number: [id number] Gender: female Date of Encounter: 12/19/2011  History of Present Illness:  [name] is a 61 y.o. very pleasant female patient who presents with the following:  Pleasant lady known well who felt a left sided breast lump with the last couple of days, here for eval, last mammo 1 year ago at breast center.   Past Medical History, Surgical History, Social History, Family History, Problem List, Medications, and Allergies have been reviewed and updated if relevant.  Prior to Admission medications   Medication Sig Start Date End Date Taking? Authorizing Provider  aspirin 81 MG tablet Take 81 mg by mouth daily.     Yes Historical Provider, MD  cetirizine (ZYRTEC) 10 MG tablet Take 10 mg by mouth daily.     Yes Historical Provider, MD  Cholecalciferol (VITAMIN D) 2000 UNITS tablet Take 2,000 Units by mouth daily.     Yes Historical Provider, MD  citalopram (CELEXA) 40 MG tablet Take 1 tablet (40 mg total) by mouth daily. 07/19/11 07/18/12 Yes Anais Koenen, MD  Coenzyme Q10 (COQ-10) 50 MG CAPS Take 1 capsule by mouth 2 (two) times daily.     Yes Historical Provider, MD  docusate sodium (COLACE) 100 MG capsule Take 100 mg by mouth 3 (three) times daily as needed.     Yes Historical Provider, MD  fluticasone (FLONASE) 50 MCG/ACT nasal spray Place 2 sprays into the nose as needed.   05/04/11 05/03/12 Yes Yaqueline Gutter, MD  gabapentin (NEURONTIN) 600 MG tablet Take 1 tablet (600 mg total) by mouth 3 (three) times daily. 11/22/11  Yes Erick Colace, MD  levothyroxine (SYNTHROID, LEVOTHROID) 50 MCG tablet Take 1 tablet (50 mcg total) by mouth daily. 05/04/11 05/03/12 Yes Keli Buehner, MD  Melaton-Thean-Cham-PassF-LBalm (MELATONIN + L-THEANINE PO) Take by mouth at bedtime.  3mg /25mg  tablet    Yes Historical Provider, MD  mirtazapine (REMERON) 15 MG tablet Take 1 tablet (15 mg total) by mouth at bedtime. 03/07/11  Yes Matheo Rathbone, MD  morphine (MS CONTIN) 15 MG 12 hr tablet Take 1 tablet (15 mg total) by mouth 3 (three) times daily. 11/28/11  Yes Erick Colace, MD  Omega-3 Fatty Acids (FISH OIL PO) Take by mouth 3 (three) times daily.   Yes Historical Provider, MD  RABEprazole (ACIPHEX) 20 MG tablet Take 1 tablet (20 mg total) by mouth daily. 06/15/11 06/14/12 Yes Hannah Beat, MD    Review of Systems:  GEN: No acute illnesses, no fevers, chills. GI: No n/v/d, eating normally Pulm: No SOB Interactive and getting along well at home.  Otherwise, ROS is as per the HPI.   Physical Examination: Filed Vitals:   12/19/11 1016  BP: 130/70  Pulse: 87  Temp: 99.6 F (37.6 C)   Filed Vitals:   12/19/11 1016  Height: 5\' 6"  (1.676 m)  Weight: 231 lb 8 oz (105.008 kg)   Body mass index is 37.37 kg/(m^2). Ideal Body Weight: Weight in (lb) to have BMI = 25: 154.6    GEN: WDWN, NAD, Non-toxic, Alert & Oriented x 3 HEENT: Atraumatic, Normocephalic.  Breast, R breast normal, No LAD. L breast with no axillary lad, 12 oclock position a small nodule, freely mobile Ears and Nose: No external deformity. EXTR: No clubbing/cyanosis/edema NEURO: Normal gait.  PSYCH: Normally interactive. Conversant. Not  depressed or anxious appearing.  Calm demeanor.    Assessment and Plan: 1. Breast lump  MM Digital Diagnostic Unilat L    To breast center for eval  Hannah Beat, MD

## 2011-12-20 ENCOUNTER — Other Ambulatory Visit: Payer: Self-pay | Admitting: Family Medicine

## 2011-12-20 ENCOUNTER — Ambulatory Visit
Admission: RE | Admit: 2011-12-20 | Discharge: 2011-12-20 | Disposition: A | Discharge status: Home / Self Care | Payer: Medicare Other | Source: Ambulatory Visit | Attending: Family Medicine | Admitting: Family Medicine

## 2011-12-20 DIAGNOSIS — N63 Unspecified lump in unspecified breast: Secondary | ICD-10-CM

## 2011-12-28 ENCOUNTER — Encounter: Payer: Self-pay | Admitting: Physical Medicine and Rehabilitation

## 2011-12-28 ENCOUNTER — Encounter: Payer: Medicare Other | Attending: Physical Medicine & Rehabilitation | Admitting: Physical Medicine and Rehabilitation

## 2011-12-28 VITALS — BP 149/69 | HR 73 | Resp 16 | Ht 66.0 in | Wt 231.0 lb

## 2011-12-28 DIAGNOSIS — M549 Dorsalgia, unspecified: Secondary | ICD-10-CM

## 2011-12-28 DIAGNOSIS — IMO0001 Reserved for inherently not codable concepts without codable children: Secondary | ICD-10-CM

## 2011-12-28 DIAGNOSIS — M961 Postlaminectomy syndrome, not elsewhere classified: Secondary | ICD-10-CM | POA: Insufficient documentation

## 2011-12-28 DIAGNOSIS — M797 Fibromyalgia: Secondary | ICD-10-CM

## 2011-12-28 MED ORDER — MORPHINE SULFATE ER 15 MG PO TBCR: 15.0000 mg | EXTENDED_RELEASE_TABLET | Freq: Three times a day (TID) | ORAL | Status: DC

## 2011-12-28 NOTE — Progress Notes (Signed)
Subjective:    Patient ID: [name], female    DOB: 28-Dec-1950, 61 y.o.   MRN: [id number]  HPI The patient complains about chronic back pain . The patient denies any radiation. The patient also complains about numbness and tingling in all of her fingers and toes intermittendly. The problem has been stable, with good and bad days. Pain Inventory Average Pain 4 Pain Right Now 4 My pain is constant and aching  In the last 24 hours, has pain interfered with the following? General activity 5 Relation with others 5 Enjoyment of life 6 What TIME of day is your pain at its worst? night Sleep (in general) Fair  Pain is worse with: walking, bending, sitting, standing and some activites Pain improves with: rest, heat/ice, therapy/exercise, pacing activities, medication and TENS Relief from Meds: 7  Mobility walk without assistance how many minutes can you walk? 5 ability to climb steps?  yes do you drive?  yes  Function disabled: date disabled 04/2008 I need assistance with the following:  meal prep, household duties and shopping  Neuro/Psych numbness tingling depression  Prior Studies Any changes since last visit?  no  Physicians involved in your care Any changes since last visit?  no   Family History  Problem Relation Age of Onset  . Breast cancer Mother   . Diabetes Mother   . Heart disease Mother   . Diabetes Father   . Heart disease Father   . Stomach cancer Maternal Uncle   . Breast cancer Paternal Aunt   . Colon cancer Neg Hx    History   Social History  . Marital Status: Married    Spouse Name: N/A    Number of Children: N/A  . Years of Education: N/A   Social History Main Topics  . Smoking status: Never Smoker   . Smokeless tobacco: Never Used  . Alcohol Use: No  . Drug Use: No  . Sexually Active: None   Other Topics Concern  . None   Social History Narrative  . None   Past Surgical History  Procedure Date  . Appendectomy   .  Cholecystectomy   . Tubal ligation   . Tonsillectomy and adenoidectomy    Past Medical History  Diagnosis Date  . GERD (gastroesophageal reflux disease)   . Unspecified essential hypertension   . Fibromyalgia   . B12 deficiency   . Depression   . Irritable bowel syndrome   . Hyperlipemia   . Migraines   . HYPOTHYROIDISM 05/29/2007   BP 149/69  Pulse 73  Resp 16  Ht 5\' 6"  (1.676 m)  Wt 231 lb (104.781 kg)  BMI 37.28 kg/m2  SpO2 95%      Review of Systems  Constitutional: Negative.   HENT: Negative.   Eyes: Negative.   Respiratory: Negative.   Cardiovascular: Negative.   Gastrointestinal: Negative.   Musculoskeletal: Positive for back pain.  Skin: Negative.   Neurological: Positive for numbness.       Tingling   Psychiatric/Behavioral:       Depression       Objective:   Physical Exam  Constitutional: She is oriented to person, place, and time. She appears well-developed and well-nourished.  HENT:  Head: Normocephalic.  Neck: Neck supple.  Musculoskeletal: She exhibits tenderness.  Neurological: She is alert and oriented to person, place, and time.  Skin: Skin is warm and dry.  Psychiatric: She has a normal mood and affect.    Symmetric normal motor tone  is noted throughout. Normal muscle bulk. Muscle testing reveals 5/5 muscle strength of the upper extremity, and 5/5 of the lower extremity. Full range of motion in upper and lower extremities. ROM of spine is not restricted. Fine motor movements are normal in both hands.  DTR in the upper and lower extremity are present and symmetric 2+. No clonus is noted.  Patient arises from chair without difficulty. Narrow based gait with normal arm swing bilateral , able to walk on heels and toes . Tandem walk is stable. No pronator drift. Rhomberg negative.       Assessment & Plan:  1. Cervical postlaminectomy syndrome. No evidence of radiculopathy. 2. Fibromyalgia  Doing well on the current pain medications with  no signs of aberrant drug behavior. Will continue using with monthly visits. Advised patient to restart water aerobics. Follow up with PA . Continue MS Contin 3 times a day  Gabapentin 600 mg 3 times per day

## 2011-12-28 NOTE — Patient Instructions (Signed)
Try to get back into water aerobics, continue with your medication.

## 2012-01-18 ENCOUNTER — Other Ambulatory Visit: Payer: Self-pay | Admitting: Family Medicine

## 2012-01-18 DIAGNOSIS — Z1231 Encounter for screening mammogram for malignant neoplasm of breast: Secondary | ICD-10-CM

## 2012-01-30 ENCOUNTER — Encounter
Payer: Medicare Other | Attending: Physical Medicine and Rehabilitation | Admitting: Physical Medicine and Rehabilitation

## 2012-01-30 ENCOUNTER — Encounter: Payer: Self-pay | Admitting: Physical Medicine and Rehabilitation

## 2012-01-30 VITALS — BP 143/60 | HR 77 | Resp 14 | Ht 66.0 in | Wt 232.0 lb

## 2012-01-30 DIAGNOSIS — M549 Dorsalgia, unspecified: Secondary | ICD-10-CM

## 2012-01-30 DIAGNOSIS — M961 Postlaminectomy syndrome, not elsewhere classified: Secondary | ICD-10-CM | POA: Insufficient documentation

## 2012-01-30 DIAGNOSIS — K219 Gastro-esophageal reflux disease without esophagitis: Secondary | ICD-10-CM | POA: Insufficient documentation

## 2012-01-30 DIAGNOSIS — M797 Fibromyalgia: Secondary | ICD-10-CM

## 2012-01-30 DIAGNOSIS — E785 Hyperlipidemia, unspecified: Secondary | ICD-10-CM | POA: Insufficient documentation

## 2012-01-30 DIAGNOSIS — IMO0001 Reserved for inherently not codable concepts without codable children: Secondary | ICD-10-CM | POA: Insufficient documentation

## 2012-01-30 DIAGNOSIS — E039 Hypothyroidism, unspecified: Secondary | ICD-10-CM | POA: Insufficient documentation

## 2012-01-30 DIAGNOSIS — R209 Unspecified disturbances of skin sensation: Secondary | ICD-10-CM | POA: Insufficient documentation

## 2012-01-30 MED ORDER — MORPHINE SULFATE ER 15 MG PO TBCR: 15.0000 mg | EXTENDED_RELEASE_TABLET | Freq: Three times a day (TID) | ORAL | Status: DC

## 2012-01-30 NOTE — Progress Notes (Signed)
Subjective:    Patient ID: [name], female    DOB: 1950/07/25, 61 y.o.   MRN: [id number]  HPI The patient complains about chronic back pain . The patient denies any radiation. The patient also complains about numbness and tingling in all of her fingers and toes intermittendly.  The problem has been stable, with good and bad days.  Pain Inventory Average Pain 4 Pain Right Now 3 My pain is constant, burning and aching  In the last 24 hours, has pain interfered with the following? General activity 5 Relation with others 5 Enjoyment of life 6 What TIME of day is your pain at its worst? night Sleep (in general) Fair  Pain is worse with: walking, bending, sitting, standing and some activites Pain improves with: rest, heat/ice, therapy/exercise, pacing activities, medication, TENS and injections Relief from Meds: 6  Mobility walk without assistance how many minutes can you walk? 5 ability to climb steps?  yes do you drive?  yes Do you have any goals in this area?  yes  Function disabled: date disabled 5/10 I need assistance with the following:  meal prep, household duties and shopping Do you have any goals in this area?  yes  Neuro/Psych bladder control problems weakness numbness depression  Prior Studies Any changes since last visit?  no  Physicians involved in your care Any changes since last visit?  no   Family History  Problem Relation Age of Onset  . Breast cancer Mother   . Diabetes Mother   . Heart disease Mother   . Diabetes Father   . Heart disease Father   . Stomach cancer Maternal Uncle   . Breast cancer Paternal Aunt   . Colon cancer Neg Hx    History   Social History  . Marital Status: Married    Spouse Name: N/A    Number of Children: N/A  . Years of Education: N/A   Social History Main Topics  . Smoking status: Never Smoker   . Smokeless tobacco: Never Used  . Alcohol Use: No  . Drug Use: No  . Sexually Active: None   Other  Topics Concern  . None   Social History Narrative  . None   Past Surgical History  Procedure Date  . Appendectomy   . Cholecystectomy   . Tubal ligation   . Tonsillectomy and adenoidectomy    Past Medical History  Diagnosis Date  . GERD (gastroesophageal reflux disease)   . Unspecified essential hypertension   . Fibromyalgia   . B12 deficiency   . Depression   . Irritable bowel syndrome   . Hyperlipemia   . Migraines   . HYPOTHYROIDISM 05/29/2007   BP 143/60  Pulse 77  Resp 14  Ht 5\' 6"  (1.676 m)  Wt 232 lb (105.235 kg)  BMI 37.45 kg/m2  SpO2 96%     Review of Systems  Constitutional: Positive for diaphoresis and unexpected weight change.  Gastrointestinal: Positive for constipation.  Musculoskeletal: Positive for joint swelling and arthralgias.  Neurological: Positive for weakness and numbness.  Psychiatric/Behavioral: Positive for dysphoric mood.  All other systems reviewed and are negative.       Objective:   Physical Exam Constitutional: She is oriented to person, place, and time. She appears well-developed and well-nourished.  HENT:  Head: Normocephalic.  Neck: Neck supple.  Musculoskeletal: She exhibits tenderness.  Neurological: She is alert and oriented to person, place, and time.  Skin: Skin is warm and dry.  Psychiatric: She has  a normal mood and affect.   Symmetric normal motor tone is noted throughout. Normal muscle bulk. Muscle testing reveals 5/5 muscle strength of the upper extremity, and 5/5 of the lower extremity. Full range of motion in upper and lower extremities. ROM of spine is not restricted. Fine motor movements are normal in both hands.  DTR in the upper and lower extremity are present and symmetric 2+. No clonus is noted.  Patient arises from chair without difficulty. Narrow based gait with normal arm swing bilateral , able to walk on heels and toes . Tandem walk is stable. No pronator drift. Rhomberg negative.          Assessment & Plan:  1. Cervical postlaminectomy syndrome. No evidence of radiculopathy.  2. Fibromyalgia  Doing well on the current pain medications with no signs of aberrant drug behavior. Will continue using with monthly visits. Advised patient to restart water aerobics. Follow up with PA .  Continue MS Contin 3 times a day  Gabapentin 600 mg 3 times per day

## 2012-01-30 NOTE — Patient Instructions (Signed)
Try to restart water aerobics,

## 2012-01-31 ENCOUNTER — Ambulatory Visit
Admission: RE | Admit: 2012-01-31 | Discharge: 2012-01-31 | Disposition: A | Discharge status: Home / Self Care | Payer: Medicare Other | Source: Ambulatory Visit | Attending: Family Medicine | Admitting: Family Medicine

## 2012-01-31 DIAGNOSIS — Z1231 Encounter for screening mammogram for malignant neoplasm of breast: Secondary | ICD-10-CM

## 2012-02-13 ENCOUNTER — Ambulatory Visit (INDEPENDENT_AMBULATORY_CARE_PROVIDER_SITE_OTHER): Payer: Medicare Other | Admitting: Family Medicine

## 2012-02-13 ENCOUNTER — Encounter: Payer: Self-pay | Admitting: Family Medicine

## 2012-02-13 VITALS — BP 110/70 | HR 68 | Temp 98.8°F | Ht 66.0 in | Wt 233.5 lb

## 2012-02-13 DIAGNOSIS — R7401 Elevation of levels of liver transaminase levels: Secondary | ICD-10-CM

## 2012-02-13 DIAGNOSIS — Z79899 Other long term (current) drug therapy: Secondary | ICD-10-CM

## 2012-02-13 DIAGNOSIS — I10 Essential (primary) hypertension: Secondary | ICD-10-CM

## 2012-02-13 DIAGNOSIS — E039 Hypothyroidism, unspecified: Secondary | ICD-10-CM

## 2012-02-13 DIAGNOSIS — E782 Mixed hyperlipidemia: Secondary | ICD-10-CM

## 2012-02-13 LAB — BASIC METABOLIC PANEL
BUN: 12 mg/dL (ref 6–23)
CO2: 32 mEq/L (ref 19–32)
Calcium: 8.9 mg/dL (ref 8.4–10.5)
Chloride: 102 mEq/L (ref 96–112)
Creatinine, Ser: 1 mg/dL (ref 0.4–1.2)
GFR: 63.72 mL/min (ref >60.00)
Glucose, Bld: 166 mg/dL — ABNORMAL HIGH (ref 70–99)
Potassium: 4 mEq/L (ref 3.5–5.1)
Sodium: 140 mEq/L (ref 135–145)

## 2012-02-13 LAB — HEPATIC FUNCTION PANEL
ALT: 39 U/L — ABNORMAL HIGH (ref 0–35)
AST: 44 U/L — ABNORMAL HIGH (ref 0–37)
Albumin: 3.6 g/dL (ref 3.5–5.2)
Alkaline Phosphatase: 102 U/L (ref 39–117)
Bilirubin, Direct: 0.1 mg/dL (ref 0.0–0.3)
Total Bilirubin: 0.4 mg/dL (ref 0.3–1.2)
Total Protein: 6.9 g/dL (ref 6.0–8.3)

## 2012-02-13 LAB — TSH: TSH: 2.73 u[IU]/mL (ref 0.35–5.50)

## 2012-02-13 LAB — T4, FREE: Free T4: 0.82 ng/dL (ref 0.60–1.60)

## 2012-02-13 LAB — T3, FREE: T3, Free: 3.1 pg/mL (ref 2.3–4.2)

## 2012-02-13 NOTE — Progress Notes (Signed)
Nature conservation officer at [facility] [address] [place] [address] Phone: [phone] Fax: [phone]  Date:  02/13/2012   Name:  [name]   DOB:  1951-02-16   MRN:  [id number] Gender: female  Age: 61 y.o.  PCP:  Hannah Beat, MD    Chief Complaint: needs thyroid checked   History of Present Illness:  [name] is a 61 y.o. very pleasant female patient who presents with the following:  Feels like thyroid medication, she has been sweating a lot and feels like her thyroid might be off  Lab Results  Component Value Date   TSH 3.77 06/15/2011   HTN: Tolerating all medications without side effects Stable and at goal No CP, no sob. No HA.  BP Readings from Last 3 Encounters:  02/13/12 110/70  01/30/12 143/60  12/28/11 149/69    Basic Metabolic Panel:    Component Value Date/Time   NA 140 02/13/2012 1310   K 4.0 02/13/2012 1310   CL 102 02/13/2012 1310   CO2 32 02/13/2012 1310   BUN 12 02/13/2012 1310   CREATININE 1.0 02/13/2012 1310   GLUCOSE 166* 02/13/2012 1310   CALCIUM 8.9 02/13/2012 1310   Lipids:  Not on any meds and has been statin intolerant in the past Lipids:    Component Value Date/Time   CHOL 191 04/28/2011 1031   TRIG 190.0* 04/28/2011 1031   HDL 37.20* 04/28/2011 1031   LDLDIRECT 161.2 02/02/2010 1026   VLDL 38.0 04/28/2011 1031   CHOLHDL 5 04/28/2011 1031    Lab Results  Component Value Date   ALT 39* 02/13/2012   AST 44* 02/13/2012   ALKPHOS 102 02/13/2012   BILITOT 0.4 02/13/2012   Depression and pain are doing ok  Past Medical History, Surgical History, Social History, Family History, Problem List, Medications, and Allergies have been reviewed and updated if relevant.  Current Outpatient Prescriptions on File Prior to Visit  Medication Sig Dispense Refill  . aspirin 81 MG tablet Take 81 mg by mouth daily.        . Calcium Carbonate (CALCIUM 600 PO) Take by mouth daily.      . cetirizine (ZYRTEC) 10 MG tablet Take 10 mg by  mouth daily.        . Cholecalciferol (VITAMIN D) 2000 UNITS tablet Take 2,000 Units by mouth daily.        . citalopram (CELEXA) 40 MG tablet Take 1 tablet (40 mg total) by mouth daily.  90 tablet  2  . Coenzyme Q10 (COQ-10) 50 MG CAPS Take 1 capsule by mouth 3 (three) times daily.       Marland Kitchen docusate sodium (COLACE) 100 MG capsule Take 100 mg by mouth 3 (three) times daily as needed.        . fluticasone (FLONASE) 50 MCG/ACT nasal spray Place 2 sprays into the nose as needed.        . gabapentin (NEURONTIN) 600 MG tablet Take 1 tablet (600 mg total) by mouth 3 (three) times daily.  270 tablet  2  . levothyroxine (SYNTHROID, LEVOTHROID) 50 MCG tablet Take 1 tablet (50 mcg total) by mouth daily.  90 tablet  3  . loratadine (CLARITIN) 10 MG tablet Take 10 mg by mouth daily.      . Melaton-Thean-Cham-PassF-LBalm (MELATONIN + L-THEANINE PO) Take by mouth at bedtime. 3mg /25mg  tablet       . mirtazapine (REMERON) 15 MG tablet Take 1 tablet (15 mg total) by mouth  at bedtime.  90 tablet  3  . morphine (MS CONTIN) 15 MG 12 hr tablet Take 1 tablet (15 mg total) by mouth 3 (three) times daily.  90 tablet  0  . RABEprazole (ACIPHEX) 20 MG tablet Take 1 tablet (20 mg total) by mouth daily.  90 tablet  3  . DISCONTD: fluticasone (FLONASE) 50 MCG/ACT nasal spray Place 2 sprays into the nose daily.  48 g  3    Review of Systems:  GEN: No acute illnesses, no fevers, chills. GI: No n/v/d, eating normally Pulm: No SOB Interactive and getting along well at home.  Otherwise, ROS is as per the HPI.   Physical Examination: Filed Vitals:   02/13/12 1204  BP: 110/70  Pulse: 68  Temp: 98.8 F (37.1 C)   Filed Vitals:   02/13/12 1204  Height: 5\' 6"  (1.676 m)  Weight: 233 lb 8 oz (105.915 kg)   Body mass index is 37.69 kg/(m^2). Ideal Body Weight: Weight in (lb) to have BMI = 25: 154.6    GEN: WDWN, NAD, Non-toxic, A & O x 3 HEENT: Atraumatic, Normocephalic. Neck supple. No masses, No LAD. Ears and  Nose: No external deformity. CV: RRR, No M/G/R. No JVD. No thrill. No extra heart sounds. PULM: CTA B, no wheezes, crackles, rhonchi. No retractions. No resp. distress. No accessory muscle use. EXTR: No c/c/e NEURO Normal gait.  PSYCH: Normally interactive. Conversant. Not depressed or anxious appearing.  Calm demeanor.    Assessment and Plan:  1. Hypothyroid  T4, free, T3, free, TSH  2. Encounter for long-term (current) use of other medications  Basic metabolic panel, Hepatic function panel  3. HYPOTHYROIDISM    4. TRANSAMINASES, SERUM, ELEVATED    5. HYPERTENSION    6. HYPERLIPIDEMIA     Checks labs as below Htn, lipids are stable  Orders Today:  Orders Placed This Encounter  Procedures  . T4, free  . T3, free  . TSH  . Basic metabolic panel  . Hepatic function panel    Medications Today: (Includes new updates added during medication reconciliation) Meds ordered this encounter  Medications  . loratadine (CLARITIN) 10 MG tablet    Sig: Take 10 mg by mouth daily.  . Calcium Carbonate (CALCIUM 600 PO)    Sig: Take by mouth daily.    Medications Discontinued: There are no discontinued medications.   Hannah Beat, MD

## 2012-02-14 ENCOUNTER — Encounter: Payer: Self-pay | Admitting: *Deleted

## 2012-02-17 ENCOUNTER — Telehealth: Payer: Self-pay | Admitting: *Deleted

## 2012-02-17 NOTE — Telephone Encounter (Signed)
Pt states she has been taking aciphex but it's very expensive so she wants to go back on the dexilant.  She says she's not going to refill the acipehex at this time, wants the dexilant.  OK to note this on form and send back in?

## 2012-02-17 NOTE — Telephone Encounter (Signed)
Agreed, based on verified med list from a few days ago, she was on aciphex not dexilant.  Can you ask her and see which she is taking -- they are the same type of drug

## 2012-02-17 NOTE — Telephone Encounter (Signed)
Form from [facility] is on your desk, they are asking for clarification on dexilant script.  They are saying that this medicine creates a duplicate therapy with aciphex and asks if dexilant should be discontinued. I dont see that dexilant is currently on pt's med list.

## 2012-02-17 NOTE — Telephone Encounter (Signed)
Left message asking that pt call back. 

## 2012-02-18 NOTE — Telephone Encounter (Signed)
Yes, please clarify for ms [facility], change to dexilant and d/c aciphex

## 2012-02-20 NOTE — Telephone Encounter (Signed)
Form completed, faxed back to [facility].

## 2012-03-01 ENCOUNTER — Encounter
Payer: Medicare Other | Attending: Physical Medicine and Rehabilitation | Admitting: Physical Medicine and Rehabilitation

## 2012-03-01 ENCOUNTER — Encounter: Payer: Self-pay | Admitting: Physical Medicine and Rehabilitation

## 2012-03-01 VITALS — BP 132/64 | HR 71 | Resp 14 | Ht 66.0 in | Wt 232.0 lb

## 2012-03-01 DIAGNOSIS — M797 Fibromyalgia: Secondary | ICD-10-CM

## 2012-03-01 DIAGNOSIS — M961 Postlaminectomy syndrome, not elsewhere classified: Secondary | ICD-10-CM

## 2012-03-01 DIAGNOSIS — IMO0001 Reserved for inherently not codable concepts without codable children: Secondary | ICD-10-CM | POA: Insufficient documentation

## 2012-03-01 MED ORDER — MORPHINE SULFATE ER 15 MG PO TBCR: 15.0000 mg | EXTENDED_RELEASE_TABLET | Freq: Three times a day (TID) | ORAL | Status: DC

## 2012-03-01 NOTE — Progress Notes (Signed)
Subjective:    Patient ID: [name], female    DOB: 1950/07/05, 62 y.o.   MRN: [id number]  HPI The patient complains about chronic back pain . The patient denies any radiation. The patient also complains about numbness and tingling in all of her fingers and toes intermittendly.  The problem has been stable, with good and bad days. Patient is doing very well today.  Pain Inventory Average Pain 4 Pain Right Now 4 My pain is constant, burning and aching  In the last 24 hours, has pain interfered with the following? General activity 7 Relation with others 7 Enjoyment of life 8 What TIME of day is your pain at its worst? morning evening and night Sleep (in general) Fair  Pain is worse with: walking, bending, sitting, standing and some activites Pain improves with: rest, heat/ice, medication, TENS and injections Relief from Meds: 6  Mobility walk without assistance how many minutes can you walk? 10 ability to climb steps?  yes do you drive?  yes Do you have any goals in this area?  yes  Function disabled: date disabled 2010 I need assistance with the following:  meal prep, household duties and shopping Do you have any goals in this area?  yes  Neuro/Psych bladder control problems weakness numbness tremor depression  Prior Studies bone scan x-rays CT/MRI nerve study  Physicians involved in your care Dr Patsy Lager   Family History  Problem Relation Age of Onset  . Breast cancer Mother   . Diabetes Mother   . Heart disease Mother   . Diabetes Father   . Heart disease Father   . Stomach cancer Maternal Uncle   . Breast cancer Paternal Aunt   . Colon cancer Neg Hx    History   Social History  . Marital Status: Married    Spouse Name: N/A    Number of Children: N/A  . Years of Education: N/A   Social History Main Topics  . Smoking status: Never Smoker   . Smokeless tobacco: Never Used  . Alcohol Use: No  . Drug Use: No  . Sexually Active: None    Other Topics Concern  . None   Social History Narrative  . None   Past Surgical History  Procedure Date  . Appendectomy   . Cholecystectomy   . Tubal ligation   . Tonsillectomy and adenoidectomy    Past Medical History  Diagnosis Date  . GERD (gastroesophageal reflux disease)   . Unspecified essential hypertension   . Fibromyalgia   . B12 deficiency   . Depression   . Irritable bowel syndrome   . Hyperlipemia   . Migraines   . HYPOTHYROIDISM 05/29/2007   BP 132/64  Pulse 71  Resp 14  Ht 5\' 6"  (1.676 m)  Wt 232 lb (105.235 kg)  BMI 37.45 kg/m2  SpO2 98%     Review of Systems  Constitutional: Positive for diaphoresis and unexpected weight change.  HENT: Positive for neck pain.   Gastrointestinal: Positive for constipation.  Musculoskeletal: Positive for myalgias, back pain and arthralgias.  Neurological: Positive for weakness and numbness.  Psychiatric/Behavioral: Positive for dysphoric mood.  All other systems reviewed and are negative.       Objective:   Physical Exam  Constitutional: She is oriented to person, place, and time. She appears well-developed and well-nourished.  HENT:  Head: Normocephalic.  Neck: Neck supple.  Musculoskeletal: She exhibits tenderness.  Neurological: She is alert and oriented to person, place, and time.  Skin: Skin is warm and dry.  Psychiatric: She has a normal mood and affect.  Symmetric normal motor tone is noted throughout. Normal muscle bulk. Muscle testing reveals 5/5 muscle strength of the upper extremity, and 5/5 of the lower extremity. Full range of motion in upper and lower extremities. ROM of spine is not restricted. Fine motor movements are normal in both hands.  DTR in the upper and lower extremity are present and symmetric 2+. No clonus is noted.  Patient arises from chair without difficulty. Narrow based gait with normal arm swing bilateral , able to walk on heels and toes . Tandem walk is stable. No pronator  drift. Rhomberg negative.       Assessment & Plan:  1. Cervical postlaminectomy syndrome. No evidence of radiculopathy.  2. Fibromyalgia  Doing well on the current pain medications with no signs of aberrant drug behavior. Will continue using with monthly visits. Advised patient to stay active and walk more regulary.  Follow up with PA .  Continue MS Contin 3 times a day  Gabapentin 600 mg 3 times per day

## 2012-03-01 NOTE — Patient Instructions (Addendum)
Continue with stretching exercises, advised patient to get a massage with a ball on her back. Try to walk regularly.

## 2012-03-02 ENCOUNTER — Other Ambulatory Visit: Payer: Self-pay | Admitting: *Deleted

## 2012-03-02 MED ORDER — DEXLANSOPRAZOLE 60 MG PO CPDR: 60.0000 mg | DELAYED_RELEASE_CAPSULE | Freq: Every day | ORAL | Status: DC

## 2012-03-30 ENCOUNTER — Encounter
Payer: Medicare Other | Attending: Physical Medicine and Rehabilitation | Admitting: Physical Medicine and Rehabilitation

## 2012-03-30 ENCOUNTER — Encounter: Payer: Self-pay | Admitting: Physical Medicine and Rehabilitation

## 2012-03-30 VITALS — BP 149/66 | HR 73 | Resp 14 | Ht 66.0 in | Wt 233.6 lb

## 2012-03-30 DIAGNOSIS — M545 Low back pain, unspecified: Secondary | ICD-10-CM | POA: Insufficient documentation

## 2012-03-30 DIAGNOSIS — M797 Fibromyalgia: Secondary | ICD-10-CM

## 2012-03-30 DIAGNOSIS — R209 Unspecified disturbances of skin sensation: Secondary | ICD-10-CM | POA: Insufficient documentation

## 2012-03-30 DIAGNOSIS — IMO0001 Reserved for inherently not codable concepts without codable children: Secondary | ICD-10-CM

## 2012-03-30 DIAGNOSIS — M961 Postlaminectomy syndrome, not elsewhere classified: Secondary | ICD-10-CM

## 2012-03-30 DIAGNOSIS — I10 Essential (primary) hypertension: Secondary | ICD-10-CM | POA: Insufficient documentation

## 2012-03-30 DIAGNOSIS — K219 Gastro-esophageal reflux disease without esophagitis: Secondary | ICD-10-CM | POA: Insufficient documentation

## 2012-03-30 DIAGNOSIS — E039 Hypothyroidism, unspecified: Secondary | ICD-10-CM | POA: Insufficient documentation

## 2012-03-30 DIAGNOSIS — E785 Hyperlipidemia, unspecified: Secondary | ICD-10-CM | POA: Insufficient documentation

## 2012-03-30 MED ORDER — METHOCARBAMOL 500 MG PO TABS: 500.0000 mg | ORAL_TABLET | Freq: Three times a day (TID) | ORAL | Status: DC

## 2012-03-30 MED ORDER — MORPHINE SULFATE ER 15 MG PO TBCR: 15.0000 mg | EXTENDED_RELEASE_TABLET | Freq: Three times a day (TID) | ORAL | Status: DC

## 2012-03-30 NOTE — Progress Notes (Signed)
Subjective:    Patient ID: [name], female    DOB: 13-Jan-1951, 61 y.o.   MRN: [id number]  HPI The patient complains about chronic back pain . The patient denies any radiation. The patient also complains about numbness and tingling in all of her fingers and toes intermittendly.  The problem has been stable, with good and bad days. Patient is complaining about some muscle pain in her mid back paraspinal.   Pain Inventory Average Pain 6 Pain Right Now 7 My pain is constant, burning, dull and aching  In the last 24 hours, has pain interfered with the following? General activity 8 Relation with others 8 Enjoyment of life 9 What TIME of day is your pain at its worst? morning, evening and night Sleep (in general) Fair  Pain is worse with: walking, bending, sitting, standing and some activites Pain improves with: rest, heat/ice, pacing activities, medication, TENS and injections Relief from Meds: 6  Mobility walk without assistance how many minutes can you walk? 10 ability to climb steps?  yes do you drive?  yes  Function disabled: date disabled 2010 I need assistance with the following:  meal prep, household duties and shopping  Neuro/Psych bladder control problems weakness numbness tingling depression  Prior Studies Any changes since last visit?  no  Physicians involved in your care Any changes since last visit?  no   Family History  Problem Relation Age of Onset  . Breast cancer Mother   . Diabetes Mother   . Heart disease Mother   . Diabetes Father   . Heart disease Father   . Stomach cancer Maternal Uncle   . Breast cancer Paternal Aunt   . Colon cancer Neg Hx    History   Social History  . Marital Status: Married    Spouse Name: N/A    Number of Children: N/A  . Years of Education: N/A   Social History Main Topics  . Smoking status: Never Smoker   . Smokeless tobacco: Never Used  . Alcohol Use: No  . Drug Use: No  . Sexually Active: None    Other Topics Concern  . None   Social History Narrative  . None   Past Surgical History  Procedure Date  . Appendectomy   . Cholecystectomy   . Tubal ligation   . Tonsillectomy and adenoidectomy    Past Medical History  Diagnosis Date  . GERD (gastroesophageal reflux disease)   . Unspecified essential hypertension   . Fibromyalgia   . B12 deficiency   . Depression   . Irritable bowel syndrome   . Hyperlipemia   . Migraines   . HYPOTHYROIDISM 05/29/2007   BP 149/66  Pulse 73  Resp 14  Ht 5\' 6"  (1.676 m)  Wt 233 lb 9.6 oz (105.96 kg)  BMI 37.70 kg/m2  SpO2 98%    Review of Systems  Constitutional: Positive for unexpected weight change.  Cardiovascular: Positive for leg swelling.  Gastrointestinal: Positive for constipation.  Genitourinary: Positive for urgency.  Neurological: Positive for weakness and numbness.       Tingling  Psychiatric/Behavioral: Positive for dysphoric mood.  All other systems reviewed and are negative.       Objective:   Physical Exam  Constitutional: She is oriented to person, place, and time. She appears well-developed and well-nourished.  HENT:  Head: Normocephalic.  Neck: Neck supple.  Musculoskeletal: She exhibits tenderness.  Neurological: She is alert and oriented to person, place, and time.  Skin: Skin is  warm and dry.  Psychiatric: She has a normal mood and affect.  Symmetric normal motor tone is noted throughout. Normal muscle bulk. Muscle testing reveals 5/5 muscle strength of the upper extremity, and 5/5 of the lower extremity. Full range of motion in upper and lower extremities. ROM of spine is not restricted. Fine motor movements are normal in both hands.  DTR in the upper and lower extremity are present and symmetric 2+. No clonus is noted.  Patient arises from chair without difficulty. Narrow based gait with normal arm swing bilateral , able to walk on heels and toes . Tandem walk is stable. No pronator drift.  Rhomberg negative.       Assessment & Plan:  1. Cervical postlaminectomy syndrome. No evidence of radiculopathy.  2. Fibromyalgia , increased muscle pain, advised patient to keep attention to a good posture , showed her some relaxing exercises for her muscles, prescribed Robaxin 500mg  tid, prn muscle cramps. Doing well on the current pain medications with no signs of aberrant drug behavior. Will continue using with monthly visits. Advised patient to stay active and walk more regulary.  Follow up with PA .  Continue MS Contin 3 times a day  Gabapentin 600 mg 3 times per day

## 2012-03-30 NOTE — Patient Instructions (Signed)
Stay as active as tolerated, try to walk regulary.

## 2012-04-14 ENCOUNTER — Other Ambulatory Visit: Payer: Self-pay | Admitting: Family Medicine

## 2012-04-26 ENCOUNTER — Encounter: Payer: Self-pay | Admitting: Physical Medicine and Rehabilitation

## 2012-04-26 ENCOUNTER — Encounter
Payer: Medicare Other | Attending: Physical Medicine and Rehabilitation | Admitting: Physical Medicine and Rehabilitation

## 2012-04-26 VITALS — BP 154/52 | HR 68 | Resp 14 | Ht 66.0 in | Wt 233.6 lb

## 2012-04-26 DIAGNOSIS — IMO0001 Reserved for inherently not codable concepts without codable children: Secondary | ICD-10-CM

## 2012-04-26 DIAGNOSIS — M961 Postlaminectomy syndrome, not elsewhere classified: Secondary | ICD-10-CM | POA: Insufficient documentation

## 2012-04-26 DIAGNOSIS — E039 Hypothyroidism, unspecified: Secondary | ICD-10-CM | POA: Insufficient documentation

## 2012-04-26 DIAGNOSIS — K219 Gastro-esophageal reflux disease without esophagitis: Secondary | ICD-10-CM | POA: Insufficient documentation

## 2012-04-26 DIAGNOSIS — E785 Hyperlipidemia, unspecified: Secondary | ICD-10-CM | POA: Insufficient documentation

## 2012-04-26 MED ORDER — MORPHINE SULFATE ER 15 MG PO TBCR: 15.0000 mg | EXTENDED_RELEASE_TABLET | Freq: Three times a day (TID) | ORAL | Status: DC

## 2012-04-26 NOTE — Patient Instructions (Signed)
Continue with exercises and walking

## 2012-04-26 NOTE — Progress Notes (Signed)
Subjective:    Patient ID: [name], female    DOB: April 06, 1951, 61 y.o.   MRN: [id number]  HPI The patient complains about chronic back pain . The patient denies any radiation. The patient also complains about numbness and tingling in all of her fingers and toes intermittendly.  The problem has been stable, with good and bad days. Patient states, that the Robaxin, I prescribed at the last visit has helped with the muscle pain in her mid back paraspinal.   Pain Inventory Average Pain 5 Pain Right Now 5 My pain is sharp, burning, stabbing and aching  In the last 24 hours, has pain interfered with the following? General activity 5 Relation with others 7 Enjoyment of life 6 What TIME of day is your pain at its worst? morning , evening and night Sleep (in general) Fair  Pain is worse with: walking, bending, sitting, standing and some activites Pain improves with: rest, heat/ice, therapy/exercise, medication, TENS and injections Relief from Meds: 6  Mobility walk without assistance ability to climb steps?  yes do you drive?  yes  Function disabled: date disabled 2010 I need assistance with the following:  meal prep, household duties and shopping  Neuro/Psych bladder control problems weakness numbness tingling depression  Prior Studies Any changes since last visit?  no  Physicians involved in your care Any changes since last visit?  no   Family History  Problem Relation Age of Onset  . Breast cancer Mother   . Diabetes Mother   . Heart disease Mother   . Diabetes Father   . Heart disease Father   . Stomach cancer Maternal Uncle   . Breast cancer Paternal Aunt   . Colon cancer Neg Hx    History   Social History  . Marital Status: Married    Spouse Name: N/A    Number of Children: N/A  . Years of Education: N/A   Social History Main Topics  . Smoking status: Never Smoker   . Smokeless tobacco: Never Used  . Alcohol Use: No  . Drug Use: No  . Sexually  Active: None   Other Topics Concern  . None   Social History Narrative  . None   Past Surgical History  Procedure Date  . Appendectomy   . Cholecystectomy   . Tubal ligation   . Tonsillectomy and adenoidectomy    Past Medical History  Diagnosis Date  . GERD (gastroesophageal reflux disease)   . Unspecified essential hypertension   . Fibromyalgia   . B12 deficiency   . Depression   . Irritable bowel syndrome   . Hyperlipemia   . Migraines   . HYPOTHYROIDISM 05/29/2007   BP 154/52  Pulse 68  Resp 14  Ht 5\' 6"  (1.676 m)  Wt 233 lb 9.6 oz (105.96 kg)  BMI 37.70 kg/m2  SpO2 97%    Review of Systems  Constitutional: Positive for diaphoresis and unexpected weight change.  Gastrointestinal: Positive for constipation.  Neurological: Positive for weakness and light-headedness.       Tingling  Psychiatric/Behavioral: Positive for dysphoric mood.  All other systems reviewed and are negative.       Objective:   Physical Exam Constitutional: She is oriented to person, place, and time. She appears well-developed and well-nourished.  HENT:  Head: Normocephalic.  Neck: Neck supple.  Musculoskeletal: She exhibits tenderness.  Neurological: She is alert and oriented to person, place, and time.  Skin: Skin is warm and dry.  Psychiatric: She has  a normal mood and affect.  Symmetric normal motor tone is noted throughout. Normal muscle bulk. Muscle testing reveals 5/5 muscle strength of the upper extremity, and 5/5 of the lower extremity. Full range of motion in upper and lower extremities. ROM of spine is mildly restricted. Fine motor movements are normal in both hands.  DTR in the upper and lower extremity are present and symmetric 2+. No clonus is noted.  Patient arises from chair without difficulty. Narrow based gait with normal arm swing bilateral , able to walk on heels and toes . Tandem walk is stable. No pronator drift. Rhomberg negative.        Assessment & Plan:    1. Cervical postlaminectomy syndrome. No evidence of radiculopathy.  2. Fibromyalgia , increased muscle pain, advised patient to keep attention to a good posture , showed her some relaxing exercises for her muscles, prescribed Robaxin 500mg  tid, prn muscle cramps at last visit, which has helped.  Doing well on the current pain medications with no signs of aberrant drug behavior. Will continue using with monthly visits. Advised patient to stay active and walk more regulary.  Follow up with PA .  Continue MS Contin 3 times a day  Gabapentin 600 mg 3 times per day

## 2012-04-27 ENCOUNTER — Encounter: Payer: Medicare Other | Admitting: Physical Medicine and Rehabilitation

## 2012-04-30 ENCOUNTER — Other Ambulatory Visit (INDEPENDENT_AMBULATORY_CARE_PROVIDER_SITE_OTHER): Payer: Medicare Other

## 2012-04-30 DIAGNOSIS — E785 Hyperlipidemia, unspecified: Secondary | ICD-10-CM

## 2012-04-30 DIAGNOSIS — E559 Vitamin D deficiency, unspecified: Secondary | ICD-10-CM

## 2012-04-30 DIAGNOSIS — E538 Deficiency of other specified B group vitamins: Secondary | ICD-10-CM

## 2012-04-30 DIAGNOSIS — R5383 Other fatigue: Secondary | ICD-10-CM

## 2012-04-30 DIAGNOSIS — R5381 Other malaise: Secondary | ICD-10-CM

## 2012-04-30 LAB — LIPID PANEL
Cholesterol: 205 mg/dL — ABNORMAL HIGH (ref 0–200)
HDL: 39.1 mg/dL (ref >39.00)
Total CHOL/HDL Ratio: 5
Triglycerides: 154 mg/dL — ABNORMAL HIGH (ref 0.0–149.0)
VLDL: 30.8 mg/dL (ref 0.0–40.0)

## 2012-04-30 LAB — CBC WITH DIFFERENTIAL/PLATELET
Basophils Absolute: 0 10*3/uL (ref 0.0–0.1)
Basophils Relative: 0.4 % (ref 0.0–3.0)
Eosinophils Absolute: 0.2 10*3/uL (ref 0.0–0.7)
Eosinophils Relative: 2.7 % (ref 0.0–5.0)
HCT: 37.8 % (ref 36.0–46.0)
Hemoglobin: 12.5 g/dL (ref 12.0–15.0)
Lymphocytes Relative: 27 % (ref 12.0–46.0)
Lymphs Abs: 1.7 10*3/uL (ref 0.7–4.0)
MCHC: 33.1 g/dL (ref 30.0–36.0)
MCV: 90.9 fl (ref 78.0–100.0)
Monocytes Absolute: 0.3 10*3/uL (ref 0.1–1.0)
Monocytes Relative: 5 % (ref 3.0–12.0)
Neutro Abs: 4 10*3/uL (ref 1.4–7.7)
Neutrophils Relative %: 64.9 % (ref 43.0–77.0)
Platelets: 268 10*3/uL (ref 150.0–400.0)
RBC: 4.16 Mil/uL (ref 3.87–5.11)
RDW: 12.6 % (ref 11.5–14.6)
WBC: 6.2 10*3/uL (ref 4.5–10.5)

## 2012-04-30 LAB — VITAMIN B12: Vitamin B-12: 254 pg/mL (ref 211–911)

## 2012-04-30 LAB — LDL CHOLESTEROL, DIRECT: Direct LDL: 151.4 mg/dL

## 2012-05-01 LAB — VITAMIN D 25 HYDROXY (VIT D DEFICIENCY, FRACTURES): Vit D, 25-Hydroxy: 42 ng/mL (ref 30–89)

## 2012-05-04 ENCOUNTER — Other Ambulatory Visit: Payer: Self-pay | Admitting: Family Medicine

## 2012-05-07 ENCOUNTER — Encounter: Payer: Self-pay | Admitting: Family Medicine

## 2012-05-07 ENCOUNTER — Ambulatory Visit (INDEPENDENT_AMBULATORY_CARE_PROVIDER_SITE_OTHER): Payer: Medicare Other | Admitting: Family Medicine

## 2012-05-07 VITALS — BP 118/80 | HR 75 | Temp 98.7°F | Ht 66.0 in | Wt 234.0 lb

## 2012-05-07 DIAGNOSIS — Z0000 Encounter for general adult medical examination without abnormal findings: Secondary | ICD-10-CM

## 2012-05-07 NOTE — Progress Notes (Signed)
Nature conservation officer at [facility] [address] [place] [address] Phone: [phone] Fax: [phone]  Date:  05/07/2012   Name:  [name]   DOB:  1951-05-25   MRN:  [id number] Gender: female Age: 61 y.o.  PCP:  Hannah Beat, MD  Evaluating MD: Hannah Beat, MD   Chief Complaint: Annual Exam   History of Present Illness:  [name] is a 61 y.o. pleasant patient who presents with the following:  Medicare CPX:  zostavax No flu shot.  Health Maintenance Summary Reviewed and updated, unless pt declines services.  Tobacco History Reviewed. Non-smoker Alcohol: No concerns, no excessive use Exercise Habits: minimal activity STD concerns: none Drug Use: None Birth control method: s/p hyst Menses regular: n/a Lumps or breast concerns: no - had [place] earlier in year which was normal  Health Maintenance  Topic Date Due  . Zostavax  11/02/2011  . Influenza Vaccine  02/26/2012  . Tetanus/tdap  06/27/2013  . Mammogram  01/30/2014  . Colonoscopy  10/19/2016    Labs reviewed with the patient.  Results for orders placed in visit on 04/30/12  CBC WITH DIFFERENTIAL      Component Value Range   WBC 6.2  4.5 - 10.5 K/uL   RBC 4.16  3.87 - 5.11 Mil/uL   Hemoglobin 12.5  12.0 - 15.0 g/dL   HCT 44.0  10.2 - 72.5 %   MCV 90.9  78.0 - 100.0 fl   MCHC 33.1  30.0 - 36.0 g/dL   RDW 36.6  44.0 - 34.7 %   Platelets 268.0  150.0 - 400.0 K/uL   Neutrophils Relative 64.9  43.0 - 77.0 %   Lymphocytes Relative 27.0  12.0 - 46.0 %   Monocytes Relative 5.0  3.0 - 12.0 %   Eosinophils Relative 2.7  0.0 - 5.0 %   Basophils Relative 0.4  0.0 - 3.0 %   Neutro Abs 4.0  1.4 - 7.7 K/uL   Lymphs Abs 1.7  0.7 - 4.0 K/uL   Monocytes Absolute 0.3  0.1 - 1.0 K/uL   Eosinophils Absolute 0.2  0.0 - 0.7 K/uL   Basophils Absolute 0.0  0.0 - 0.1 K/uL  LIPID PANEL      Component Value Range   Cholesterol 205 (*) 0 - 200 mg/dL   Triglycerides 425.9 (*) 0.0 - 149.0 mg/dL   HDL 56.38   >75.64 mg/dL   VLDL 33.2  0.0 - 95.1 mg/dL   Total CHOL/HDL Ratio 5    VITAMIN B12      Component Value Range   Vitamin B-12 254  211 - 911 pg/mL  VITAMIN D 25 HYDROXY      Component Value Range   Vit D, 25-Hydroxy 42  30 - 89 ng/mL  LDL CHOLESTEROL, DIRECT      Component Value Range   Direct LDL 151.4       Patient Active Problem List  Diagnosis  . HYPOTHYROIDISM  . Other B-Complex Deficiencies  . DEFICIENCY, VITAMIN D NOS  . HYPERLIPIDEMIA  . DEPRESSION  . HYPERTENSION  . ALLERGIC RHINITIS  . GERD  . FIBROMYALGIA  . FATIGUE  . TRANSAMINASES, SERUM, ELEVATED  . 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 Postlaminectomy syndrome, cervical region    Past Medical History  Diagnosis Date  . GERD (gastroesophageal reflux disease)   . Unspecified essential hypertension   . Fibromyalgia   . B12 deficiency   . Depression   .  Irritable bowel syndrome   . Hyperlipemia   . Migraines   . HYPOTHYROIDISM 05/29/2007    Past Surgical History  Procedure Date  . Appendectomy   . Cholecystectomy   . Tubal ligation   . Tonsillectomy and adenoidectomy     History  Substance Use Topics  . Smoking status: Never Smoker   . Smokeless tobacco: Never Used  . Alcohol Use: No    Family History  Problem Relation Age of Onset  . Breast cancer Mother   . Diabetes Mother   . Heart disease Mother   . Diabetes Father   . Heart disease Father   . Stomach cancer Maternal Uncle   . Breast cancer Paternal Aunt   . Colon cancer Neg Hx     Allergies  Allergen Reactions  . Clarithromycin   . Cyanocobalamin Er   . Cyclobenzaprine Hcl     REACTION: THROAT CLOSED,TOUNGE SWELLING  . Doxycycline   . Duloxetine   . Metronidazole   . Naproxen Sodium   . Naproxen Sodium   . Nsaids     REACTION: due to liver damage  . Oxycodone Hcl   . Penicillins   . Rizatriptan Benzoate   . Rosuvastatin     REACTION: MUSCLE ACHES  . Sumatriptan   . Tramadol   .  Venlafaxine     Medication list has been reviewed and updated.  Outpatient Prescriptions Prior to Visit  Medication Sig Dispense Refill  . aspirin 81 MG tablet Take 81 mg by mouth daily.        . Calcium Carbonate (CALCIUM 600 PO) Take by mouth daily.      . cetirizine (ZYRTEC) 10 MG tablet Take 10 mg by mouth daily.        . Cholecalciferol (VITAMIN D) 2000 UNITS tablet Take 2,000 Units by mouth daily.        . citalopram (CELEXA) 40 MG tablet TAKE ONE TABLET BY MOUTH ONE TIME DAILY  90 tablet  0  . Coenzyme Q10 (COQ-10) 50 MG CAPS Take 1 capsule by mouth 3 (three) times daily.       Marland Kitchen dexlansoprazole (DEXILANT) 60 MG capsule Take 1 capsule (60 mg total) by mouth daily.  90 capsule  2  . docusate sodium (COLACE) 100 MG capsule Take 100 mg by mouth 3 (three) times daily as needed.        . gabapentin (NEURONTIN) 600 MG tablet Take 1 tablet (600 mg total) by mouth 3 (three) times daily.  270 tablet  2  . loratadine (CLARITIN) 10 MG tablet Take 10 mg by mouth daily.      . Melaton-Thean-Cham-PassF-LBalm (MELATONIN + L-THEANINE PO) Take by mouth at bedtime. 3mg /25mg  tablet       . methocarbamol (ROBAXIN) 500 MG tablet Take 1 tablet (500 mg total) by mouth 3 (three) times daily.  60 tablet  1  . mirtazapine (REMERON) 15 MG tablet TAKE 1 TABLET AT BEDTIME  90 tablet  3  . morphine (MS CONTIN) 15 MG 12 hr tablet Take 1 tablet (15 mg total) by mouth 3 (three) times daily.  90 tablet  0  . fluticasone (FLONASE) 50 MCG/ACT nasal spray Place 2 sprays into the nose as needed.        Marland Kitchen levothyroxine (SYNTHROID, LEVOTHROID) 50 MCG tablet Take 1 tablet (50 mcg total) by mouth daily.  90 tablet  3   Last reviewed on 05/07/2012  2:56 PM by Consuello Masse, CMA  Review of Systems:  General: Denies fever, chills, sweats. No significant weight loss. Eyes: Denies blurring,significant itching ENT: Denies earache, sore throat, and hoarseness.  Cardiovascular: Denies chest pains, palpitations, dyspnea on  exertion,  Respiratory: Denies cough, dyspnea at rest,wheeezing Breast: no concerns about lumps GI: Denies nausea, vomiting, diarrhea, constipation, change in bowel habits, abdominal pain, melena, hematochezia GU: Denies dysuria, hematuria, urinary hesitancy, nocturia, denies STD risk, no concerns about discharge Musculoskeletal: CHRONIC BACK PAIN AND JOINT PAINS CONTINUE Derm: Denies rash, itching Neuro: Denies  paresthesias, frequent falls, frequent headaches Psych: Denies depression, anxiety Endocrine: Denies cold intolerance, heat intolerance, polydipsia Heme: Denies enlarged lymph nodes Allergy: No hayfever   Physical Examination: Filed Vitals:   05/07/12 1448  BP: 118/80  Pulse: 75  Temp: 98.7 F (37.1 C)  TempSrc: Oral  Height: 5\' 6"  (1.676 m)  Weight: 234 lb (106.142 kg)  SpO2: 97%    Body mass index is 37.77 kg/(m^2). Ideal Body Weight: Weight in (lb) to have BMI = 25: 154.6    GEN: well developed, well nourished, no acute distress Eyes: conjunctiva and lids normal, PERRLA, EOMI ENT: TM clear, nares clear, oral exam WNL Neck: supple, no lymphadenopathy, no thyromegaly, no JVD Pulm: clear to auscultation and percussion, respiratory effort normal CV: regular rate and rhythm, S1-S2, no murmur, rub or gallop, no bruits Chest: no scars, masses, no lumps BREAST: breast exam normal, no masses GI: soft, non-tender; no hepatosplenomegaly, masses; active bowel sounds all quadrants GU: GU exam declined Lymph: no cervical, axillary or inguinal adenopathy MSK: gait normal, muscle tone and strength WNL, no joint swelling, effusions, discoloration, crepitus  SKIN: clear, good turgor, color WNL, no rashes, lesions, or ulcerations Neuro: normal mental status, normal strength, sensation, and motion Psych: alert; oriented to person, place and time, normally interactive and not anxious or depressed in appearance.   Assessment and Plan:  1. Routine general medical examination at  a health care facility     I have personally reviewed the Medicare Annual Wellness questionnaire and have noted 1. The patient's medical and social history 2. Their use of alcohol, tobacco or illicit drugs 3. Their current medications and supplements 4. The patient's functional ability including ADL's, fall risks, home safety risks and hearing or visual             impairment. 5. Diet and physical activities 6. Evidence for depression or mood disorders  The patients weight, height, BMI and visual acuity have been recorded in the chart I have made referrals, counseling and provided education to the patient based review of the above and I have provided the pt with a written personalized care plan for preventive services.  I have provided the patient with a copy of your personalized plan for preventive services. Instructed to take the time to review along with their updated medication list.   Work on diet and exercise -- try to get into pool  Orders Today:  No orders of the defined types were placed in this encounter.    Updated Medication List: (Includes new medications, updates to list, dose adjustments) No orders of the defined types were placed in this encounter.    Medications Discontinued: There are no discontinued medications.   Hannah Beat, MD

## 2012-05-22 ENCOUNTER — Encounter: Payer: Self-pay | Admitting: Physical Medicine and Rehabilitation

## 2012-05-22 ENCOUNTER — Encounter
Payer: Medicare Other | Attending: Physical Medicine and Rehabilitation | Admitting: Physical Medicine and Rehabilitation

## 2012-05-22 VITALS — BP 142/55 | HR 75 | Resp 14 | Ht 66.0 in | Wt 236.0 lb

## 2012-05-22 DIAGNOSIS — M961 Postlaminectomy syndrome, not elsewhere classified: Secondary | ICD-10-CM

## 2012-05-22 DIAGNOSIS — IMO0001 Reserved for inherently not codable concepts without codable children: Secondary | ICD-10-CM

## 2012-05-22 DIAGNOSIS — G8929 Other chronic pain: Secondary | ICD-10-CM | POA: Insufficient documentation

## 2012-05-22 MED ORDER — MORPHINE SULFATE ER 15 MG PO TBCR: 15.0000 mg | EXTENDED_RELEASE_TABLET | Freq: Three times a day (TID) | ORAL | Status: DC

## 2012-05-22 NOTE — Patient Instructions (Signed)
Continue with your walking and your exercise program.

## 2012-05-22 NOTE — Progress Notes (Signed)
Subjective:    Patient ID: [name], female    DOB: [birth year], 61 y.o.   MRN: [id number]  HPI The patient complains about chronic back pain . The patient denies any radiation. The patient also complains about numbness and tingling in all of her fingers and toes intermittendly.  The problem has been stable, with good and bad days. Patient states, that the Robaxin, I prescribed at the last visit has helped with the muscle pain in her mid back paraspinal. She states that she is walking and exercising more now.  Pain Inventory Average Pain 4 Pain Right Now 4 My pain is constant, burning and aching  In the last 24 hours, has pain interfered with the following? General activity 5 Relation with others 6 Enjoyment of life 6 What TIME of day is your pain at its worst? morning and night Sleep (in general) Fair  Pain is worse with: walking, bending, sitting, standing and some activites Pain improves with: rest, heat/ice, therapy/exercise, medication and injections Relief from Meds: 6  Mobility walk without assistance how many minutes can you walk? 10 ability to climb steps?  yes do you drive?  yes Do you have any goals in this area?  yes  Function employed # of hrs/week 10-12 disabled: date disabled 2009 I need assistance with the following:  meal prep, household duties and shopping Do you have any goals in this area?  yes  Neuro/Psych bladder control problems weakness tremor depression  Prior Studies Any changes since last visit?  no  Physicians involved in your care Any changes since last visit?  no   Family History  Problem Relation Age of Onset  . Breast cancer Mother   . Diabetes Mother   . Heart disease Mother   . Diabetes Father   . Heart disease Father   . Stomach cancer Maternal Uncle   . Breast cancer Paternal Aunt   . Colon cancer Neg Hx    History   Social History  . Marital Status: Married    Spouse Name: N/A    Number of Children: N/A  .  Years of Education: N/A   Social History Main Topics  . Smoking status: Never Smoker   . Smokeless tobacco: Never Used  . Alcohol Use: No  . Drug Use: No  . Sexually Active: None   Other Topics Concern  . None   Social History Narrative  . None   Past Surgical History  Procedure Date  . Appendectomy   . Cholecystectomy   . Tubal ligation   . Tonsillectomy and adenoidectomy    Past Medical History  Diagnosis Date  . GERD (gastroesophageal reflux disease)   . Unspecified essential hypertension   . Fibromyalgia   . B12 deficiency   . Depression   . Irritable bowel syndrome   . Hyperlipemia   . Migraines   . HYPOTHYROIDISM 05/29/2007   BP 142/55  Pulse 75  Resp 14  Ht 5\' 6"  (1.676 m)  Wt 236 lb (107.049 kg)  BMI 38.09 kg/m2  SpO2 99%    Review of Systems  Constitutional: Positive for unexpected weight change.  HENT: Positive for neck pain.   Cardiovascular: Positive for leg swelling.  Gastrointestinal: Positive for constipation.  Neurological: Positive for tremors and weakness.  Psychiatric/Behavioral: Positive for dysphoric mood.  All other systems reviewed and are negative.       Objective:   Physical Exam Constitutional: She is oriented to person, place, and time. She appears well-developed and  well-nourished.  HENT:  Head: Normocephalic.  Neck: Neck supple.  Musculoskeletal: She exhibits tenderness.  Neurological: She is alert and oriented to person, place, and time.  Skin: Skin is warm and dry.  Psychiatric: She has a normal mood and affect.  Symmetric normal motor tone is noted throughout. Normal muscle bulk. Muscle testing reveals 5/5 muscle strength of the upper extremity, and 5/5 of the lower extremity. Full range of motion in upper and lower extremities. ROM of spine is mildly restricted. Fine motor movements are normal in both hands.  DTR in the upper and lower extremity are present and symmetric 2+. No clonus is noted.  Patient arises from  chair without difficulty. Narrow based gait with normal arm swing bilateral , able to walk on heels and toes . Tandem walk is stable. No pronator drift. Rhomberg negative.        Assessment & Plan:  1. Cervical postlaminectomy syndrome. No evidence of radiculopathy.  2. Fibromyalgia , increased muscle pain, advised patient to keep attention to a good posture , showed her some relaxing exercises for her muscles, prescribed Robaxin 500mg  tid, prn muscle cramps at last visit, which has helped.  Doing well on the current pain medications with no signs of aberrant drug behavior. Will continue using with monthly visits. Advised patient to continue with her walking and exercises program.   Follow up with PA .  Continue MS Contin 3 times a day  Gabapentin 600 mg 3 times per day

## 2012-05-26 ENCOUNTER — Other Ambulatory Visit: Payer: Self-pay | Admitting: Family Medicine

## 2012-06-25 ENCOUNTER — Encounter: Payer: Self-pay | Admitting: Physical Medicine and Rehabilitation

## 2012-06-25 ENCOUNTER — Encounter
Payer: Medicare Other | Attending: Physical Medicine and Rehabilitation | Admitting: Physical Medicine and Rehabilitation

## 2012-06-25 VITALS — BP 136/45 | HR 80 | Resp 14 | Ht 66.0 in | Wt 235.0 lb

## 2012-06-25 DIAGNOSIS — Z5181 Encounter for therapeutic drug level monitoring: Secondary | ICD-10-CM

## 2012-06-25 DIAGNOSIS — M545 Low back pain, unspecified: Secondary | ICD-10-CM | POA: Insufficient documentation

## 2012-06-25 DIAGNOSIS — R209 Unspecified disturbances of skin sensation: Secondary | ICD-10-CM | POA: Insufficient documentation

## 2012-06-25 DIAGNOSIS — E785 Hyperlipidemia, unspecified: Secondary | ICD-10-CM | POA: Insufficient documentation

## 2012-06-25 DIAGNOSIS — IMO0001 Reserved for inherently not codable concepts without codable children: Secondary | ICD-10-CM | POA: Insufficient documentation

## 2012-06-25 DIAGNOSIS — E039 Hypothyroidism, unspecified: Secondary | ICD-10-CM | POA: Insufficient documentation

## 2012-06-25 DIAGNOSIS — M961 Postlaminectomy syndrome, not elsewhere classified: Secondary | ICD-10-CM | POA: Insufficient documentation

## 2012-06-25 DIAGNOSIS — K219 Gastro-esophageal reflux disease without esophagitis: Secondary | ICD-10-CM | POA: Insufficient documentation

## 2012-06-25 MED ORDER — MORPHINE SULFATE ER 15 MG PO TBCR: 15.0000 mg | EXTENDED_RELEASE_TABLET | Freq: Three times a day (TID) | ORAL | Status: DC

## 2012-06-25 NOTE — Progress Notes (Signed)
Subjective:    Patient ID: [name], female    DOB: 1951-02-01, 61 y.o.   MRN: [id number]  HPI The patient complains about chronic back pain . The patient denies any radiation. The patient also complains about numbness and tingling in all of her fingers and toes intermittendly.  The problem has been stable, with good and bad days. Patient states, that the Robaxin, I prescribed at the last visit has helped with the muscle pain in her mid back paraspinal. She states that she is walking and exercising more now.  Pain Inventory Average Pain 5 Pain Right Now 4 My pain is constant, burning and aching  In the last 24 hours, has pain interfered with the following? General activity 7 Relation with others 6 Enjoyment of life 6 What TIME of day is your pain at its worst? evening and night Sleep (in general) Fair  Pain is worse with: walking, bending, sitting, standing and some activites Pain improves with: rest, heat/ice, pacing activities, medication, TENS and injections Relief from Meds: 6  Mobility walk without assistance how many minutes can you walk? 10 ability to climb steps?  yes do you drive?  yes Do you have any goals in this area?  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  yes  Neuro/Psych bladder control problems weakness tremor tingling depression  Prior Studies Any changes since last visit?  no  Physicians involved in your care Any changes since last visit?  no   Family History  Problem Relation Age of Onset  . Breast cancer Mother   . Diabetes Mother   . Heart disease Mother   . Diabetes Father   . Heart disease Father   . Stomach cancer Maternal Uncle   . Breast cancer Paternal Aunt   . Colon cancer Neg Hx    History   Social History  . Marital Status: Married    Spouse Name: N/A    Number of  Children: N/A  . Years of Education: N/A   Social History Main Topics  . Smoking status: Never Smoker   . Smokeless tobacco: Never Used  . Alcohol Use: No  . Drug Use: No  . Sexually Active: None   Other Topics Concern  . None   Social History Narrative  . None   Past Surgical History  Procedure Date  . Appendectomy   . Cholecystectomy   . Tubal ligation   . Tonsillectomy and adenoidectomy    Past Medical History  Diagnosis Date  . GERD (gastroesophageal reflux disease)   . Unspecified essential hypertension   . Fibromyalgia   . B12 deficiency   . Depression   . Irritable bowel syndrome   . Hyperlipemia   . Migraines   . HYPOTHYROIDISM 05/29/2007   BP 136/45  Pulse 80  Resp 14  Ht 5\' 6"  (1.676 m)  Wt 235 lb (106.595 kg)  BMI 37.93 kg/m2  SpO2 94%    Review of Systems  Constitutional: Positive for diaphoresis and unexpected weight change.  Cardiovascular: Positive for leg swelling.  Gastrointestinal: Positive for constipation.  Musculoskeletal: Positive for back pain.  Neurological: Positive for tremors and weakness.       Tingling  Psychiatric/Behavioral: Positive for dysphoric mood.  All other systems reviewed and are negative.       Objective:   Physical Exam  Constitutional: She is oriented to person, place, and time. She appears well-developed and well-nourished.  HENT:  Head: Normocephalic.  Neck: Neck supple.  Musculoskeletal: She exhibits tenderness.  Neurological: She is alert and oriented to person, place, and time.  Skin: Skin is warm and dry.  Psychiatric: She has a normal mood and affect.  Symmetric normal motor tone is noted throughout. Normal muscle bulk. Muscle testing reveals 5/5 muscle strength of the upper extremity, and 5/5 of the lower extremity. Full range of motion in upper and lower extremities. ROM of spine is mildly restricted. Fine motor movements are normal in both hands.  DTR in the upper and lower extremity are present  and symmetric 2+. No clonus is noted.  Patient arises from chair without difficulty. Narrow based gait with normal arm swing bilateral , able to walk on heels and toes . Tandem walk is stable. No pronator drift. Rhomberg negative.        Assessment & Plan:  1. Cervical postlaminectomy syndrome. No evidence of radiculopathy.  2. Fibromyalgia , increased muscle pain, advised patient to keep attention to a good posture , showed her some relaxing exercises for her muscles, prescribed Robaxin 500mg  tid, prn muscle cramps at last visit, which has helped.  Doing well on the current pain medications with no signs of aberrant drug behavior. Will continue using with monthly visits. Advised patient to continue with her walking and exercises program.  Follow up with PA .  Continue MS Contin 3 times a day  Gabapentin 600 mg 3 times per day

## 2012-06-25 NOTE — Patient Instructions (Signed)
Continue with your walking and exercise program as pain permits 

## 2012-06-25 NOTE — Addendum Note (Signed)
Addended by: Claiborne Rigg D on: 06/25/2012 02:21 PM   Modules accepted: Orders

## 2012-07-07 ENCOUNTER — Other Ambulatory Visit: Payer: Self-pay | Admitting: Physical Medicine and Rehabilitation

## 2012-07-20 ENCOUNTER — Other Ambulatory Visit: Payer: Self-pay | Admitting: Family Medicine

## 2012-07-23 ENCOUNTER — Other Ambulatory Visit: Payer: Self-pay

## 2012-07-23 ENCOUNTER — Encounter
Payer: Medicare Other | Attending: Physical Medicine and Rehabilitation | Admitting: Physical Medicine and Rehabilitation

## 2012-07-23 ENCOUNTER — Encounter: Payer: Self-pay | Admitting: Physical Medicine and Rehabilitation

## 2012-07-23 VITALS — BP 135/68 | HR 68 | Resp 14 | Ht 66.0 in | Wt 236.4 lb

## 2012-07-23 DIAGNOSIS — M961 Postlaminectomy syndrome, not elsewhere classified: Secondary | ICD-10-CM | POA: Insufficient documentation

## 2012-07-23 DIAGNOSIS — IMO0001 Reserved for inherently not codable concepts without codable children: Secondary | ICD-10-CM | POA: Insufficient documentation

## 2012-07-23 DIAGNOSIS — M549 Dorsalgia, unspecified: Secondary | ICD-10-CM

## 2012-07-23 MED ORDER — MORPHINE SULFATE ER 15 MG PO TBCR: 15.0000 mg | EXTENDED_RELEASE_TABLET | Freq: Three times a day (TID) | ORAL | Status: DC

## 2012-07-23 NOTE — Telephone Encounter (Signed)
Pt request status of citalopram refill. Spoke with [name] at [employer] and pt can pick up rx after lunch. Pt advised.

## 2012-07-23 NOTE — Patient Instructions (Signed)
Continue with your walking and exercise program as pain permits 

## 2012-07-23 NOTE — Progress Notes (Signed)
Subjective:    Patient ID: [name], female    DOB: Oct 28, 1950, 62 y.o.   MRN: [id number]  HPI The patient complains about chronic back pain . The patient denies any radiation. The patient also complains about numbness and tingling in all of her fingers and toes intermittendly.  The problem has been stable, with good and bad days. Patient states, that the Robaxin, I prescribed at the last visit has helped with the muscle pain in her mid back paraspinal. She states that she is walking and exercising more now.  Pain Inventory Average Pain 4 Pain Right Now 3 My pain is constant, burning and aching  In the last 24 hours, has pain interfered with the following? General activity 6 Relation with others 6 Enjoyment of life 6 What TIME of day is your pain at its worst? morning daytime and night Sleep (in general) Fair  Pain is worse with: walking, sitting, standing and some activites Pain improves with: rest, heat/ice, medication, TENS and injections Relief from Meds: 7  Mobility walk without assistance how many minutes can you walk? 10 ability to climb steps?  yes do you drive?  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  no  Physicians involved in your care Any changes since last visit?  no   Family History  Problem Relation Age of Onset  . Breast cancer Mother   . Diabetes Mother   . Heart disease Mother   . Diabetes Father   . Heart disease Father   . Stomach cancer Maternal Uncle   . Breast cancer Paternal Aunt   . Colon cancer Neg Hx    History   Social History  . Marital Status: Married    Spouse Name: N/A    Number of Children: N/A  . Years of Education: N/A   Social History Main Topics  . Smoking status: Never Smoker   .  Smokeless tobacco: Never Used  . Alcohol Use: No  . Drug Use: No  . Sexually Active: None   Other Topics Concern  . None   Social History Narrative  . None   Past Surgical History  Procedure Date  . Appendectomy   . Cholecystectomy   . Tubal ligation   . Tonsillectomy and adenoidectomy    Past Medical History  Diagnosis Date  . GERD (gastroesophageal reflux disease)   . Unspecified essential hypertension   . Fibromyalgia   . B12 deficiency   . Depression   . Irritable bowel syndrome   . Hyperlipemia   . Migraines   . HYPOTHYROIDISM 05/29/2007   BP 135/68  Pulse 68  Resp 14  Ht 5\' 6"  (1.676 m)  Wt 236 lb 6.4 oz (107.23 kg)  BMI 38.16 kg/m2  SpO2 98%   Review of Systems  Constitutional: Positive for unexpected weight change.  Cardiovascular: Positive for leg swelling.  Gastrointestinal: Positive for constipation.  Musculoskeletal:       Spasms   Neurological: Positive for tremors.  Psychiatric/Behavioral: Positive for dysphoric mood.  All other systems reviewed and are negative.       Objective:   Physical Exam Constitutional: She is oriented to person, place, and time. She appears well-developed and well-nourished.  HENT:  Head: Normocephalic.  Neck: Neck supple.  Musculoskeletal: She exhibits tenderness.  Neurological: She is alert and oriented to person, place, and time.  Skin: Skin is warm and dry.  Psychiatric: She has a normal mood and affect.  Symmetric normal motor tone is noted throughout. Normal muscle bulk. Muscle testing reveals 5/5 muscle strength of the upper extremity, and 5/5 of the lower extremity. Full range of motion in upper and lower extremities. ROM of spine is mildly restricted. Fine motor movements are normal in both hands.  DTR in the upper and lower extremity are present and symmetric 2+. No clonus is noted.  Patient arises from chair without difficulty. Narrow based gait with normal arm swing bilateral , able to walk on heels and  toes . Tandem walk is stable. No pronator drift. Rhomberg negative.        Assessment & Plan:  1. Cervical postlaminectomy syndrome. No evidence of radiculopathy.  2. Fibromyalgia , increased muscle pain, advised patient to keep attention to a good posture , showed her some relaxing exercises for her muscles, prescribed Robaxin 500mg  tid, prn muscle cramps at last visit, which has helped.  Doing well on the current pain medications with no signs of aberrant drug behavior. Will continue using with monthly visits. Advised patient to continue with her walking and exercises program.  Follow up with PA .  Continue MS Contin 3 times a day  Gabapentin 600 mg 3 times per day

## 2012-08-15 ENCOUNTER — Other Ambulatory Visit: Payer: Self-pay | Admitting: Family Medicine

## 2012-08-21 ENCOUNTER — Encounter: Payer: Self-pay | Admitting: Physical Medicine and Rehabilitation

## 2012-08-21 ENCOUNTER — Encounter
Payer: Medicare Other | Attending: Physical Medicine and Rehabilitation | Admitting: Physical Medicine and Rehabilitation

## 2012-08-21 VITALS — BP 144/66 | HR 69 | Resp 14 | Ht 66.0 in | Wt 236.0 lb

## 2012-08-21 DIAGNOSIS — M961 Postlaminectomy syndrome, not elsewhere classified: Secondary | ICD-10-CM

## 2012-08-21 DIAGNOSIS — IMO0001 Reserved for inherently not codable concepts without codable children: Secondary | ICD-10-CM | POA: Insufficient documentation

## 2012-08-21 DIAGNOSIS — G8929 Other chronic pain: Secondary | ICD-10-CM | POA: Insufficient documentation

## 2012-08-21 MED ORDER — MORPHINE SULFATE ER 15 MG PO TBCR: 15.0000 mg | EXTENDED_RELEASE_TABLET | Freq: Three times a day (TID) | ORAL | Status: DC

## 2012-08-21 NOTE — Patient Instructions (Signed)
Continue with your walking and exercise program 

## 2012-08-21 NOTE — Progress Notes (Signed)
Subjective:    Patient ID: [name], female    DOB: [birth year], 62 y.o.   MRN: [id number]  HPI The patient complains about chronic back pain . The patient denies any radiation. The patient also complains about numbness and tingling in all of her fingers and toes intermittendly.  The problem has been stable, with good and bad days. Patient states, that the Robaxin, I prescribed at the last visit has helped with the muscle pain in her mid back paraspinal. She states that she is walking and exercising more now.  Pain Inventory Average Pain 4 Pain Right Now 3 My pain is constant and aching  In the last 24 hours, has pain interfered with the following? General activity 6 Relation with others 7 Enjoyment of life 6 What TIME of day is your pain at its worst? morning, evening and night Sleep (in general) Fair  Pain is worse with: walking, bending, sitting, standing and some activites Pain improves with: rest, heat/ice, medication, TENS and injections Relief from Meds: 6  Mobility walk without assistance how many minutes can you walk? 10 ability to climb steps?  yes do you drive?  yes Do you have any goals in this area?  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  yes  Neuro/Psych bladder control problems weakness numbness tremor depression  Prior Studies Any changes since last visit?  no  Physicians involved in your care Any changes since last visit?  no   Family History  Problem Relation Age of Onset  . Breast cancer Mother   . Diabetes Mother   . Heart disease Mother   . Diabetes Father   . Heart disease Father   . Stomach cancer Maternal Uncle   . Breast cancer Paternal Aunt   . Colon cancer Neg Hx    History   Social History  . Marital Status: Married    Spouse Name: N/A    Number of Children: N/A  . Years of Education: N/A    Social History Main Topics  . Smoking status: Never Smoker   . Smokeless tobacco: Never Used  . Alcohol Use: No  . Drug Use: No  . Sexually Active: None   Other Topics Concern  . None   Social History Narrative  . None   Past Surgical History  Procedure Laterality Date  . Appendectomy    . Cholecystectomy    . Tubal ligation    . Tonsillectomy and adenoidectomy     Past Medical History  Diagnosis Date  . GERD (gastroesophageal reflux disease)   . Unspecified essential hypertension   . Fibromyalgia   . B12 deficiency   . Depression   . Irritable bowel syndrome   . Hyperlipemia   . Migraines   . HYPOTHYROIDISM 05/29/2007   BP 144/66  Pulse 69  Resp 14  Ht 5\' 6"  (1.676 m)  Wt 236 lb (107.049 kg)  BMI 38.11 kg/m2  SpO2 97%     Review of Systems  Constitutional: Positive for diaphoresis and unexpected weight change.  HENT: Positive for neck pain.   Cardiovascular: Positive for leg swelling.  Gastrointestinal: Positive for constipation.  Musculoskeletal: Positive for back pain.  Neurological: Positive for tremors, weakness and numbness.  Psychiatric/Behavioral: Positive for dysphoric mood.  All other systems reviewed and are negative.       Objective:   Physical  Exam Constitutional: She is oriented to person, place, and time. She appears well-developed and well-nourished.  HENT:  Head: Normocephalic.  Neck: Neck supple.  Musculoskeletal: She exhibits tenderness.  Neurological: She is alert and oriented to person, place, and time.  Skin: Skin is warm and dry.  Psychiatric: She has a normal mood and affect.  Symmetric normal motor tone is noted throughout. Normal muscle bulk. Muscle testing reveals 5/5 muscle strength of the upper extremity, and 5/5 of the lower extremity. Full range of motion in upper and lower extremities. ROM of spine is mildly restricted. Fine motor movements are normal in both hands.  DTR in the upper and lower extremity are present  and symmetric 2+. No clonus is noted.  Patient arises from chair without difficulty. Narrow based gait with normal arm swing bilateral , able to walk on heels and toes . Tandem walk is stable. No pronator drift. Rhomberg negative.        Assessment & Plan:  1. Cervical postlaminectomy syndrome. No evidence of radiculopathy.  2. Fibromyalgia , increased muscle pain, advised patient to keep attention to a good posture , showed her some relaxing exercises for her muscles, prescribed Robaxin 500mg  tid, prn muscle cramps at last visit, which has helped.  Doing well on the current pain medications with no signs of aberrant drug behavior. Will continue using with monthly visits. Advised patient to continue with her walking and exercises program.  Follow up with PA .  Continue MS Contin 3 times a day  Gabapentin 600 mg 3 times per day

## 2012-09-14 ENCOUNTER — Encounter: Payer: Self-pay | Admitting: Physical Medicine and Rehabilitation

## 2012-09-14 ENCOUNTER — Encounter
Payer: Medicare Other | Attending: Physical Medicine and Rehabilitation | Admitting: Physical Medicine and Rehabilitation

## 2012-09-14 VITALS — BP 144/59 | HR 72 | Resp 14 | Ht 66.0 in | Wt 235.0 lb

## 2012-09-14 DIAGNOSIS — IMO0001 Reserved for inherently not codable concepts without codable children: Secondary | ICD-10-CM

## 2012-09-14 DIAGNOSIS — M961 Postlaminectomy syndrome, not elsewhere classified: Secondary | ICD-10-CM

## 2012-09-14 DIAGNOSIS — K219 Gastro-esophageal reflux disease without esophagitis: Secondary | ICD-10-CM | POA: Insufficient documentation

## 2012-09-14 DIAGNOSIS — E039 Hypothyroidism, unspecified: Secondary | ICD-10-CM | POA: Insufficient documentation

## 2012-09-14 DIAGNOSIS — G8929 Other chronic pain: Secondary | ICD-10-CM | POA: Insufficient documentation

## 2012-09-14 DIAGNOSIS — R209 Unspecified disturbances of skin sensation: Secondary | ICD-10-CM | POA: Insufficient documentation

## 2012-09-14 DIAGNOSIS — M549 Dorsalgia, unspecified: Secondary | ICD-10-CM | POA: Insufficient documentation

## 2012-09-14 DIAGNOSIS — I10 Essential (primary) hypertension: Secondary | ICD-10-CM | POA: Insufficient documentation

## 2012-09-14 DIAGNOSIS — E785 Hyperlipidemia, unspecified: Secondary | ICD-10-CM | POA: Insufficient documentation

## 2012-09-14 MED ORDER — MORPHINE SULFATE ER 15 MG PO TBCR: 15.0000 mg | EXTENDED_RELEASE_TABLET | Freq: Three times a day (TID) | ORAL | Status: DC

## 2012-09-14 NOTE — Patient Instructions (Signed)
Continue with your exercise and walking program 

## 2012-09-14 NOTE — Progress Notes (Signed)
Subjective:    Patient ID: [name], female    DOB: July 07, 1950, 62 y.o.   MRN: [id number]  HPI The patient complains about chronic back pain . The patient denies any radiation. The patient also complains about numbness and tingling in all of her fingers and toes intermittendly.  The problem has been stable, with good and bad days. Patient states, that the Robaxin, I prescribed at the last visit has helped with the muscle pain in her mid back paraspinal. She states that she is walking and exercising more now.  Pain Inventory Average Pain 4 Pain Right Now 4 My pain is burning and aching  In the last 24 hours, has pain interfered with the following? General activity 5 Relation with others 5 Enjoyment of life 5 What TIME of day is your pain at its worst? morning evening and night Sleep (in general) Poor  Pain is worse with: walking, bending, sitting, standing and some activites Pain improves with: rest, heat/ice, pacing activities, medication, TENS and injections Relief from Meds: 6  Mobility walk without assistance how many minutes can you walk? 10 ability to climb steps?  yes do you drive?  yes Do you have any goals in this area?  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  yes  Neuro/Psych bladder control problems weakness tremor depression  Prior Studies Any changes since last visit?  no  Physicians involved in your care Any changes since last visit?  no   Family History  Problem Relation Age of Onset  . Breast cancer Mother   . Diabetes Mother   . Heart disease Mother   . Diabetes Father   . Heart disease Father   . Stomach cancer Maternal Uncle   . Breast cancer Paternal Aunt   . Colon cancer Neg Hx    History   Social History  . Marital Status: Married    Spouse Name: N/A    Number of Children: N/A  . Years of  Education: N/A   Social History Main Topics  . Smoking status: Never Smoker   . Smokeless tobacco: Never Used  . Alcohol Use: No  . Drug Use: No  . Sexually Active: None   Other Topics Concern  . None   Social History Narrative  . None   Past Surgical History  Procedure Laterality Date  . Appendectomy    . Cholecystectomy    . Tubal ligation    . Tonsillectomy and adenoidectomy     Past Medical History  Diagnosis Date  . GERD (gastroesophageal reflux disease)   . Unspecified essential hypertension   . Fibromyalgia   . B12 deficiency   . Depression   . Irritable bowel syndrome   . Hyperlipemia   . Migraines   . HYPOTHYROIDISM 05/29/2007   BP 144/59  Pulse 72  Resp 14  Ht 5\' 6"  (1.676 m)  Wt 235 lb (106.595 kg)  BMI 37.95 kg/m2  SpO2 95%     Review of Systems  Constitutional: Positive for unexpected weight change.  HENT: Positive for neck pain.   Cardiovascular: Positive for leg swelling.  Gastrointestinal: Positive for constipation.  Musculoskeletal: Positive for back pain.  Neurological: Positive for tremors and weakness.  Psychiatric/Behavioral: Positive for dysphoric mood.       Objective:   Physical Exam Constitutional: She is oriented to person, place, and time. She  appears well-developed and well-nourished.  HENT:  Head: Normocephalic.  Neck: Neck supple.  Musculoskeletal: She exhibits tenderness.  Neurological: She is alert and oriented to person, place, and time.  Skin: Skin is warm and dry.  Psychiatric: She has a normal mood and affect.  Symmetric normal motor tone is noted throughout. Normal muscle bulk. Muscle testing reveals 5/5 muscle strength of the upper extremity, and 5/5 of the lower extremity. Full range of motion in upper and lower extremities. ROM of spine is mildly restricted. Fine motor movements are normal in both hands.  DTR in the upper and lower extremity are present and symmetric 2+. No clonus is noted.  Patient arises from  chair without difficulty. Narrow based gait with normal arm swing bilateral , able to walk on heels and toes . Tandem walk is stable. No pronator drift. Rhomberg negative.        Assessment & Plan:  1. Cervical postlaminectomy syndrome. No evidence of radiculopathy.  2. Fibromyalgia , increased muscle pain, advised patient to keep attention to a good posture , showed her some relaxing exercises for her muscles, prescribed Robaxin 500mg  tid, prn muscle cramps at last visit, which has helped.  Doing well on the current pain medications with no signs of aberrant drug behavior. Will continue using with monthly visits. Advised patient to continue with her walking and exercises program.  Follow up with PA .  Continue MS Contin 3 times a day  Gabapentin 600 mg 3 times per day

## 2012-10-22 ENCOUNTER — Encounter
Payer: Medicare Other | Attending: Physical Medicine and Rehabilitation | Admitting: Physical Medicine and Rehabilitation

## 2012-10-22 ENCOUNTER — Encounter: Payer: Self-pay | Admitting: Physical Medicine and Rehabilitation

## 2012-10-22 ENCOUNTER — Ambulatory Visit (HOSPITAL_COMMUNITY)
Admission: RE | Admit: 2012-10-22 | Discharge: 2012-10-22 | Disposition: A | Discharge status: Home / Self Care | Payer: Medicare Other | Source: Ambulatory Visit | Attending: Physical Medicine and Rehabilitation | Admitting: Physical Medicine and Rehabilitation

## 2012-10-22 VITALS — BP 149/46 | HR 74 | Resp 14 | Ht 66.0 in | Wt 235.0 lb

## 2012-10-22 DIAGNOSIS — R109 Unspecified abdominal pain: Secondary | ICD-10-CM | POA: Insufficient documentation

## 2012-10-22 DIAGNOSIS — R079 Chest pain, unspecified: Secondary | ICD-10-CM | POA: Insufficient documentation

## 2012-10-22 DIAGNOSIS — R52 Pain, unspecified: Secondary | ICD-10-CM | POA: Insufficient documentation

## 2012-10-22 DIAGNOSIS — W1809XA Striking against other object with subsequent fall, initial encounter: Secondary | ICD-10-CM | POA: Insufficient documentation

## 2012-10-22 DIAGNOSIS — M961 Postlaminectomy syndrome, not elsewhere classified: Secondary | ICD-10-CM

## 2012-10-22 MED ORDER — MORPHINE SULFATE ER 15 MG PO TBCR
15.0000 mg | EXTENDED_RELEASE_TABLET | Freq: Three times a day (TID) | ORAL | Status: DC
Start: 2012-10-22 — End: 2012-11-20

## 2012-10-22 NOTE — Progress Notes (Signed)
Subjective:    Patient ID: [name], female    DOB: 10/03/1950, 62 y.o.   MRN: [id number]  HPI The patient complains about chronic back pain . The patient denies any radiation. The patient also complains about numbness and tingling in all of her fingers and toes intermittendly.  The problem has been stable, with good and bad days. Patient states, that the Robaxin, I prescribed at the last visit has helped with the muscle pain in her mid back paraspinal. She states that she is walking and exercising more now. The patient reports that she fell on her left side yesterday and has some pain in her ribs on the left. She states that deep breathing and lying on her left side hurt.  Pain Inventory Average Pain 4 Pain Right Now 6 My pain is constant, burning and aching  In the last 24 hours, has pain interfered with the following? General activity 8 Relation with others 8 Enjoyment of life 7 What TIME of day is your pain at its worst? evening Sleep (in general) Fair  Pain is worse with: walking, standing and some activites Pain improves with: rest, heat/ice, therapy/exercise, pacing activities, medication, TENS and injections Relief from Meds: 6  Mobility walk without assistance how many minutes can you walk? 10-15 ability to climb steps?  yes do you drive?  yes Do you have any goals in this area?  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  yes  Neuro/Psych weakness tremor spasms depression  Prior Studies Any changes since last visit?  no  Physicians involved in your care Any changes since last visit?  no   Family History  Problem Relation Age of Onset  . Breast cancer Mother   . Diabetes Mother   . Heart disease Mother   . Diabetes Father   . Heart disease Father   . Stomach cancer Maternal Uncle   . Breast cancer Paternal Aunt   .  Colon cancer Neg Hx    History   Social History  . Marital Status: Married    Spouse Name: N/A    Number of Children: N/A  . Years of Education: N/A   Social History Main Topics  . Smoking status: Never Smoker   . Smokeless tobacco: Never Used  . Alcohol Use: No  . Drug Use: No  . Sexually Active: None   Other Topics Concern  . None   Social History Narrative  . None   Past Surgical History  Procedure Laterality Date  . Appendectomy    . Cholecystectomy    . Tubal ligation    . Tonsillectomy and adenoidectomy     Past Medical History  Diagnosis Date  . GERD (gastroesophageal reflux disease)   . Unspecified essential hypertension   . Fibromyalgia   . B12 deficiency   . Depression   . Irritable bowel syndrome   . Hyperlipemia   . Migraines   . HYPOTHYROIDISM 05/29/2007   BP 149/46  Pulse 74  Resp 14  Ht 5\' 6"  (1.676 m)  Wt 235 lb (106.595 kg)  BMI 37.95 kg/m2  SpO2 98%     Review of Systems  Constitutional: Positive for diaphoresis and unexpected weight change.  Cardiovascular: Positive for leg swelling.  Neurological: Positive for tremors and weakness.  Psychiatric/Behavioral: Positive for dysphoric mood.  All other systems reviewed and are negative.  Objective:   Physical Exam Constitutional: She is oriented to person, place, and time. She appears well-developed and well-nourished.  HENT:  Head: Normocephalic.  Neck: Neck supple.  Musculoskeletal: She exhibits tenderness left anterior torso around rib  8-10.  Neurological: She is alert and oriented to person, place, and time.  Skin: Skin is warm and dry.  Psychiatric: She has a normal mood and affect.  Symmetric normal motor tone is noted throughout. Normal muscle bulk. Muscle testing reveals 5/5 muscle strength of the upper extremity, and 5/5 of the lower extremity. Full range of motion in upper and lower extremities. ROM of spine is mildly restricted. Fine motor movements are normal in  both hands.  DTR in the upper and lower extremity are present and symmetric 2+. No clonus is noted.  Patient arises from chair without difficulty. Narrow based gait with normal arm swing bilateral , able to walk on heels and toes . Tandem walk is stable. No pronator drift. Rhomberg negative. Pain with deep breathing, painful with rotation of thoracic spine to the right        Assessment & Plan:  1. Cervical postlaminectomy syndrome. No evidence of radiculopathy.  2. Fibromyalgia , increased muscle pain, advised patient to keep attention to a good posture , showed her some relaxing exercises for her muscles, prescribed Robaxin 500mg  tid, prn muscle cramps at last visit, which has helped.  Doing well on the current pain medications with no signs of aberrant drug behavior. Will continue using with monthly visits. Advised patient to continue with her walking and exercises program, as pain permits.  3. Fall on left anterior torso, possible bruised or fractured rib, ordered x-ray of left ribs. Follow up with PA .  Continue MS Contin 3 times a day  Gabapentin 600 mg 3 times per day

## 2012-10-22 NOTE — Patient Instructions (Signed)
Continue with your walking and exercising program as tolerated.

## 2012-10-23 ENCOUNTER — Telehealth: Payer: Self-pay

## 2012-10-23 NOTE — Telephone Encounter (Signed)
Patient informed of x-ray results. 

## 2012-10-23 NOTE — Telephone Encounter (Signed)
Message copied by Judd Gaudier on Tue Oct 23, 2012  9:32 AM ------      Message from: Su Monks      Created: Tue Oct 23, 2012  8:31 AM       Please call patient to tell results, of left ribs x-ray.            Findings: No acute bony abnormality.  No visible fracture.  No      pneumothorax or effusion.  Visualized left lung is clear.             ------

## 2012-10-29 ENCOUNTER — Telehealth: Payer: Self-pay | Admitting: *Deleted

## 2012-10-29 MED ORDER — GABAPENTIN 600 MG PO TABS: 600.0000 mg | ORAL_TABLET | Freq: Three times a day (TID) | ORAL | Status: DC

## 2012-10-29 NOTE — Telephone Encounter (Signed)
[facility] called for refill on gabapentin. Refilled

## 2012-11-07 ENCOUNTER — Encounter: Payer: Self-pay | Admitting: Family Medicine

## 2012-11-07 ENCOUNTER — Ambulatory Visit (INDEPENDENT_AMBULATORY_CARE_PROVIDER_SITE_OTHER): Payer: Medicare Other | Admitting: Family Medicine

## 2012-11-07 VITALS — BP 120/70 | HR 92 | Temp 99.0°F | Ht 66.0 in | Wt 234.0 lb

## 2012-11-07 DIAGNOSIS — IMO0001 Reserved for inherently not codable concepts without codable children: Secondary | ICD-10-CM

## 2012-11-07 DIAGNOSIS — I10 Essential (primary) hypertension: Secondary | ICD-10-CM

## 2012-11-07 DIAGNOSIS — R61 Generalized hyperhidrosis: Secondary | ICD-10-CM

## 2012-11-07 DIAGNOSIS — L989 Disorder of the skin and subcutaneous tissue, unspecified: Secondary | ICD-10-CM

## 2012-11-07 DIAGNOSIS — E039 Hypothyroidism, unspecified: Secondary | ICD-10-CM

## 2012-11-07 DIAGNOSIS — E782 Mixed hyperlipidemia: Secondary | ICD-10-CM

## 2012-11-07 DIAGNOSIS — R5381 Other malaise: Secondary | ICD-10-CM

## 2012-11-07 DIAGNOSIS — E538 Deficiency of other specified B group vitamins: Secondary | ICD-10-CM

## 2012-11-07 LAB — TSH: TSH: 3.18 u[IU]/mL (ref 0.35–5.50)

## 2012-11-07 LAB — T3, FREE: T3, Free: 3.3 pg/mL (ref 2.3–4.2)

## 2012-11-07 LAB — T4, FREE: Free T4: 0.94 ng/dL (ref 0.60–1.60)

## 2012-11-07 NOTE — Progress Notes (Signed)
Nature conservation officer at [facility] [address] [place] [address] Phone: [phone] Fax: [phone]  Date:  11/07/2012   Name:  [name]   DOB:  November 27, 1950   MRN:  [id number] Gender: female Age: 62 y.o.  Primary Physician:  Alicia Beat, MD  Evaluating MD: Alicia Beat, MD   Chief Complaint: Follow-up   History of Present Illness:  [name] is a 62 y.o. pleasant patient who presents with the following:  Larey Seat on Easter Sunday on the left side and hurting a lot.   F/u HTN, stable. At goal, no probs  Spot, right upper face - to derm  Left posterior buttocks. Comes and goes. Buttocks pain, some radiation. Known ddd  Sometimes will get burning pains in her abdomen.   Also takes narcotics. Constipation. Intermittently a problem. Reviewed last colonosopcy which was normal.  Still having melt-down sweats. 1988 hysterectomy  Patient Active Problem List   Diagnosis Date Noted  . 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 Postlaminectomy syndrome, cervical region 08/29/2011  . FATIGUE 05/04/2009  . TRANSAMINASES, SERUM, ELEVATED 12/24/2008  . DEPRESSION 09/22/2008  . ALLERGIC RHINITIS 09/22/2008  . HYPOTHYROIDISM 05/29/2007  . HYPERLIPIDEMIA 05/29/2007  . DEFICIENCY, VITAMIN D NOS 01/11/2007  . FIBROMYALGIA 01/11/2007  . Other B-Complex Deficiencies 11/13/2006  . HYPERTENSION 11/13/2006  . GERD 11/13/2006    Past Medical History  Diagnosis Date  . GERD (gastroesophageal reflux disease)   . Unspecified essential hypertension   . Fibromyalgia   . B12 deficiency   . Depression   . Irritable bowel syndrome   . Hyperlipemia   . Migraines   . HYPOTHYROIDISM 05/29/2007    Past Surgical History  Procedure Laterality Date  . Appendectomy    . Cholecystectomy    . Tubal ligation    . Tonsillectomy and adenoidectomy      History   Social History  . Marital Status: Married    Spouse Name: N/A   Number of Children: N/A  . Years of Education: N/A   Occupational History  . Not on file.   Social History Main Topics  . Smoking status: Never Smoker   . Smokeless tobacco: Never Used  . Alcohol Use: No  . Drug Use: No  . Sexually Active: Not on file   Other Topics Concern  . Not on file   Social History Narrative  . No narrative on file    Family History  Problem Relation Age of Onset  . Breast cancer Mother   . Diabetes Mother   . Heart disease Mother   . Diabetes Father   . Heart disease Father   . Stomach cancer Maternal Uncle   . Breast cancer Paternal Aunt   . Colon cancer Neg Hx     Allergies  Allergen Reactions  . Clarithromycin   . Cyanocobalamin Er   . Cyclobenzaprine Hcl     REACTION: THROAT CLOSED,TOUNGE SWELLING  . Doxycycline   . Duloxetine   . Metronidazole   . Naproxen Sodium   . Naproxen Sodium   . Nsaids     REACTION: due to liver damage  . Oxycodone Hcl   . Penicillins   . Rizatriptan Benzoate   . Rosuvastatin     REACTION: MUSCLE ACHES  . Sumatriptan   . Tramadol   . Venlafaxine     Medication list has been reviewed and updated.  Outpatient Prescriptions Prior to Visit  Medication Sig Dispense Refill  .  aspirin 81 MG tablet Take 81 mg by mouth daily.        . Calcium Carbonate (CALCIUM 600 PO) Take by mouth daily.      . cetirizine (ZYRTEC) 10 MG tablet Take 10 mg by mouth daily.        . Cholecalciferol (VITAMIN D) 2000 UNITS tablet Take 2,000 Units by mouth daily.        . citalopram (CELEXA) 40 MG tablet TAKE ONE TABLET BY MOUTH ONE TIME DAILY  90 tablet  3  . Coenzyme Q10 (COQ-10) 50 MG CAPS Take 1 capsule by mouth 3 (three) times daily.       Marland Kitchen dexlansoprazole (DEXILANT) 60 MG capsule Take 1 capsule (60 mg total) by mouth daily.  90 capsule  2  . docusate sodium (COLACE) 100 MG capsule Take 100 mg by mouth 3 (three) times daily as needed.        . fluticasone (FLONASE) 50 MCG/ACT nasal spray Place 2 sprays into the nose as  needed.        . gabapentin (NEURONTIN) 600 MG tablet Take 1 tablet (600 mg total) by mouth 3 (three) times daily.  270 tablet  2  . loratadine (CLARITIN) 10 MG tablet Take 10 mg by mouth daily.      . Melaton-Thean-Cham-PassF-LBalm (MELATONIN + L-THEANINE PO) Take by mouth at bedtime. 3mg /25mg  tablet       . methocarbamol (ROBAXIN) 500 MG tablet TAKE ONE TABLET BY MOUTH THREE TIMES DAILY  60 tablet  0  . mirtazapine (REMERON) 15 MG tablet TAKE 1 TABLET AT BEDTIME  90 tablet  3  . morphine (MS CONTIN) 15 MG 12 hr tablet Take 1 tablet (15 mg total) by mouth 3 (three) times daily.  90 tablet  0  . SYNTHROID 50 MCG tablet TAKE 1 TABLET DAILY  90 tablet  3  . RABEprazole (ACIPHEX) 20 MG tablet TAKE 1 TABLET BY MOUTH EVERY DAY  90 tablet  1   No facility-administered medications prior to visit.    Review of Systems:   GEN: No acute illnesses, no fevers, chills. GI: No n/v/d, eating normally. + const Pulm: No SOB Interactive and getting along well at home.  Otherwise, ROS is as per the HPI.   Physical Examination: BP 120/70  Pulse 92  Temp(Src) 99 F (37.2 C) (Oral)  Ht 5\' 6"  (1.676 m)  Wt 234 lb (106.142 kg)  BMI 37.79 kg/m2  SpO2 97%  Ideal Body Weight: Weight in (lb) to have BMI = 25: 154.6   GEN: WDWN, NAD, Non-toxic, A & O x 3 HEENT: Atraumatic, Normocephalic. Neck supple. No masses, No LAD. Ears and Nose: No external deformity. CV: RRR, No M/G/R. No JVD. No thrill. No extra heart sounds. PULM: CTA B, no wheezes, crackles, rhonchi. No retractions. No resp. distress. No accessory muscle use. EXTR: No c/c/e SKIN: r face lesion, slightly elevated and pink NEURO Normal gait.  PSYCH: Normally interactive. Conversant. Not depressed or anxious appearing.  Calm demeanor.    Assessment and Plan:  HYPERTENSION: stable  HYPERLIPIDEMIA Lipids:    Component Value Date/Time   CHOL 205* 04/30/2012 1127   TRIG 154.0* 04/30/2012 1127   HDL 39.10 04/30/2012 1127   LDLDIRECT 151.4  04/30/2012 1127   VLDL 30.8 04/30/2012 1127   CHOLHDL 5 04/30/2012 1127     HYPOTHYROIDISM - Plan: T4, free, T3, free, TSH  Other B-complex deficiencies  Other malaise and fatigue  Sweating: unclear cause, check TSH  Facial  lesion: concern for BCC vs AK, patient of Dr. Londell Moh, so will follow-up with [facility].  Orders Today:  Orders Placed This Encounter  Procedures  . T4, free  . T3, free  . TSH    Updated Medication List: (Includes new medications, updates to list, dose adjustments) No orders of the defined types were placed in this encounter.    Medications Discontinued: Medications Discontinued During This Encounter  Medication Reason  . RABEprazole (ACIPHEX) 20 MG tablet Error      Signed, Ieasha Boerema T. Weber Monnier, MD 11/07/2012 12:17 PM

## 2012-11-08 ENCOUNTER — Encounter: Payer: Self-pay | Admitting: Family Medicine

## 2012-11-20 ENCOUNTER — Encounter: Payer: Self-pay | Admitting: Physical Medicine and Rehabilitation

## 2012-11-20 ENCOUNTER — Encounter
Payer: Medicare Other | Attending: Physical Medicine and Rehabilitation | Admitting: Physical Medicine and Rehabilitation

## 2012-11-20 VITALS — BP 144/64 | HR 71 | Resp 14 | Ht 66.0 in | Wt 235.0 lb

## 2012-11-20 DIAGNOSIS — Z5181 Encounter for therapeutic drug level monitoring: Secondary | ICD-10-CM

## 2012-11-20 DIAGNOSIS — IMO0001 Reserved for inherently not codable concepts without codable children: Secondary | ICD-10-CM | POA: Insufficient documentation

## 2012-11-20 DIAGNOSIS — M549 Dorsalgia, unspecified: Secondary | ICD-10-CM | POA: Insufficient documentation

## 2012-11-20 DIAGNOSIS — M961 Postlaminectomy syndrome, not elsewhere classified: Secondary | ICD-10-CM | POA: Insufficient documentation

## 2012-11-20 DIAGNOSIS — Z9181 History of falling: Secondary | ICD-10-CM | POA: Insufficient documentation

## 2012-11-20 DIAGNOSIS — R209 Unspecified disturbances of skin sensation: Secondary | ICD-10-CM | POA: Insufficient documentation

## 2012-11-20 DIAGNOSIS — Z79899 Other long term (current) drug therapy: Secondary | ICD-10-CM

## 2012-11-20 DIAGNOSIS — G8929 Other chronic pain: Secondary | ICD-10-CM | POA: Insufficient documentation

## 2012-11-20 MED ORDER — MORPHINE SULFATE ER 15 MG PO TBCR: 15.0000 mg | EXTENDED_RELEASE_TABLET | Freq: Three times a day (TID) | ORAL | Status: DC

## 2012-11-20 NOTE — Patient Instructions (Signed)
Continue with your exercise and walking program 

## 2012-11-20 NOTE — Progress Notes (Signed)
Subjective:    Patient ID: [name], female    DOB: May 19, 1951, 62 y.o.   MRN: [id number]  HPI The patient complains about chronic back pain . The patient denies any radiation. The patient also complains about numbness and tingling in all of her fingers and toes intermittendly.  The problem has been stable, with good and bad days. Patient states, that the Robaxin, I prescribed  has helped with the muscle pain in her mid back paraspinal. She also states that the magnesium I recommended at the last visit has given her relief. She states that she is walking and exercising more now.  Pain Inventory Average Pain 4 Pain Right Now 4 My pain is constant, burning and aching  In the last 24 hours, has pain interfered with the following? General activity 7 Relation with others 7 Enjoyment of life 6 What TIME of day is your pain at its worst? monring, evening, and night Sleep (in general) Fair  Pain is worse with: walking, bending, sitting, standing and some activites Pain improves with: rest, heat/ice, therapy/exercise, pacing activities, medication, TENS and injections Relief from Meds: 6  Mobility walk without assistance how many minutes can you walk? 10 ability to climb steps?  yes do you drive?  yes Do you have any goals in this area?  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  yes  Neuro/Psych bladder control problems weakness numbness tremor tingling depression  Prior Studies Any changes since last visit?  no  Physicians involved in your care Any changes since last visit?  no   Family History  Problem Relation Age of Onset  . Breast cancer Mother   . Diabetes Mother   . Heart disease Mother   . Diabetes Father   . Heart disease Father   . Stomach cancer Maternal Uncle   . Breast cancer Paternal Aunt   . Colon cancer Neg Hx     History   Social History  . Marital Status: Married    Spouse Name: N/A    Number of Children: N/A  . Years of Education: N/A   Social History Main Topics  . Smoking status: Never Smoker   . Smokeless tobacco: Never Used  . Alcohol Use: No  . Drug Use: No  . Sexually Active: None   Other Topics Concern  . None   Social History Narrative  . None   Past Surgical History  Procedure Laterality Date  . Appendectomy    . Cholecystectomy    . Tubal ligation    . Tonsillectomy and adenoidectomy     Past Medical History  Diagnosis Date  . GERD (gastroesophageal reflux disease)   . Unspecified essential hypertension   . Fibromyalgia   . B12 deficiency   . Depression   . Irritable bowel syndrome   . Hyperlipemia   . Migraines   . HYPOTHYROIDISM 05/29/2007   BP 144/64  Pulse 71  Resp 14  Ht 5\' 6"  (1.676 m)  Wt 235 lb (106.595 kg)  BMI 37.95 kg/m2  SpO2 98%     Review of Systems  Constitutional: Positive for unexpected weight change.  Cardiovascular: Positive for leg swelling.  Neurological: Positive for weakness.  Psychiatric/Behavioral: Positive for dysphoric mood.  All other systems reviewed and are negative.       Objective:   Physical Exam Constitutional: She is oriented  to person, place, and time. She appears well-developed and well-nourished.  HENT:  Head: Normocephalic.  Neck: Neck supple.  Musculoskeletal: She exhibits tenderness left anterior torso around rib 8-10 still present but much less today.  Neurological: She is alert and oriented to person, place, and time.  Skin: Skin is warm and dry.  Psychiatric: She has a normal mood and affect.  Symmetric normal motor tone is noted throughout. Normal muscle bulk. Muscle testing reveals 5/5 muscle strength of the upper extremity, and 5/5 of the lower extremity. Full range of motion in upper and lower extremities. ROM of spine is mildly restricted. Fine motor movements are normal in both hands.  DTR  in the upper and lower extremity are present and symmetric 2+. No clonus is noted.  Patient arises from chair without difficulty. Narrow based gait with normal arm swing bilateral , able to walk on heels and toes . Tandem walk is stable. No pronator drift. Rhomberg negative.        Assessment & Plan:  1. Cervical postlaminectomy syndrome. No evidence of radiculopathy.  2. Fibromyalgia , increased muscle pain, advised patient to keep attention to a good posture , showed her some relaxing exercises for her muscles, prescribed Robaxin 500mg  tid, prn muscle cramps at last visit, which has helped. The patient reports that the magnesium I recommended at the last visit has helped her with the muscle spasms and also has given her more energy. Doing well on the current pain medications with no signs of aberrant drug behavior. Will continue using with monthly visits. Advised patient to continue with her walking and exercises program, as pain permits.  3. Fall on left anterior torso, possible bruised or fractured rib, ordered x-ray of left ribs, which did not show any significant findings.  Follow up with PA .  Continue MS Contin 3 times a day  Gabapentin 600 mg 3 times per day

## 2012-12-20 ENCOUNTER — Telehealth: Payer: Self-pay

## 2012-12-20 NOTE — Telephone Encounter (Signed)
Ok to refill 

## 2012-12-20 NOTE — Telephone Encounter (Signed)
Methocarbamol 500mg  1 tablet po tid requested.

## 2012-12-21 ENCOUNTER — Encounter
Payer: Medicare Other | Attending: Physical Medicine and Rehabilitation | Admitting: Physical Medicine and Rehabilitation

## 2012-12-21 ENCOUNTER — Encounter: Payer: Self-pay | Admitting: Physical Medicine and Rehabilitation

## 2012-12-21 VITALS — BP 130/58 | HR 84 | Resp 14 | Ht 65.0 in | Wt 232.6 lb

## 2012-12-21 DIAGNOSIS — R209 Unspecified disturbances of skin sensation: Secondary | ICD-10-CM | POA: Insufficient documentation

## 2012-12-21 DIAGNOSIS — M961 Postlaminectomy syndrome, not elsewhere classified: Secondary | ICD-10-CM

## 2012-12-21 DIAGNOSIS — K219 Gastro-esophageal reflux disease without esophagitis: Secondary | ICD-10-CM | POA: Insufficient documentation

## 2012-12-21 DIAGNOSIS — IMO0001 Reserved for inherently not codable concepts without codable children: Secondary | ICD-10-CM | POA: Insufficient documentation

## 2012-12-21 DIAGNOSIS — E039 Hypothyroidism, unspecified: Secondary | ICD-10-CM | POA: Insufficient documentation

## 2012-12-21 DIAGNOSIS — E785 Hyperlipidemia, unspecified: Secondary | ICD-10-CM | POA: Insufficient documentation

## 2012-12-21 MED ORDER — MORPHINE SULFATE ER 15 MG PO TBCR: 15.0000 mg | EXTENDED_RELEASE_TABLET | Freq: Three times a day (TID) | ORAL | Status: DC

## 2012-12-21 MED ORDER — METHOCARBAMOL 500 MG PO TABS: ORAL_TABLET | ORAL | Status: DC

## 2012-12-21 NOTE — Patient Instructions (Signed)
Continue with your walking and stretching program.

## 2012-12-21 NOTE — Progress Notes (Signed)
Subjective:    Patient ID: [name], female    DOB: 1951-02-12, 62 y.o.   MRN: [id number]  HPI The patient complains about chronic back pain . The patient denies any radiation. The patient also complains about numbness and tingling in all of her fingers and toes intermittendly.  The problem has been stable, with good and bad days. Patient states, that the Robaxin, I prescribed has helped with the muscle pain in her mid back paraspinal. She also states that the magnesium I recommended at the last visit has given her relief. She states that she is walking and exercising more now.  Pain Inventory Average Pain 4 Pain Right Now 5 My pain is burning and aching  In the last 24 hours, has pain interfered with the following? General activity 5 Relation with others 5 Enjoyment of life 6 What TIME of day is your pain at its worst? morning and night Sleep (in general) Fair  Pain is worse with: walking, bending, sitting, inactivity, standing and some activites Pain improves with: rest, heat/ice, therapy/exercise, pacing activities, medication, TENS and injections Relief from Meds: 6  Mobility walk without assistance how many minutes can you walk? 10 ability to climb steps?  yes do you drive?  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  no  Physicians involved in your care Any changes since last visit?  no   Family History  Problem Relation Age of Onset  . Breast cancer Mother   . Diabetes Mother   . Heart disease Mother   . Diabetes Father   . Heart disease Father   . Stomach cancer Maternal Uncle   . Breast cancer Paternal Aunt   . Colon cancer Neg Hx    History   Social History  . Marital Status: Married    Spouse Name: N/A   Number of Children: N/A  . Years of Education: N/A   Social History Main Topics  . Smoking status: Never Smoker   . Smokeless tobacco: Never Used  . Alcohol Use: No  . Drug Use: No  . Sexually Active: None   Other Topics Concern  . None   Social History Narrative  . None   Past Surgical History  Procedure Laterality Date  . Appendectomy    . Cholecystectomy    . Tubal ligation    . Tonsillectomy and adenoidectomy     Past Medical History  Diagnosis Date  . GERD (gastroesophageal reflux disease)   . Unspecified essential hypertension   . Fibromyalgia   . B12 deficiency   . Depression   . Irritable bowel syndrome   . Hyperlipemia   . Migraines   . HYPOTHYROIDISM 05/29/2007   BP 130/58  Pulse 84  Resp 14  Ht 5\' 5"  (1.651 m)  Wt 232 lb 9.6 oz (105.507 kg)  BMI 38.71 kg/m2  SpO2 98%    Review of Systems  Constitutional: Positive for unexpected weight change.  Cardiovascular: Positive for leg swelling.  Gastrointestinal: Positive for constipation.  Genitourinary:       Bladder control problems  Musculoskeletal:       Spasms  Neurological: Positive for tremors, weakness and numbness.  Psychiatric/Behavioral: Positive for dysphoric mood.  All other systems reviewed and are negative.       Objective:  Physical Exam Constitutional: She is oriented to person, place, and time. She appears well-developed and well-nourished.  HENT:  Head: Normocephalic.  Neck: Neck supple.  Musculoskeletal: She exhibits tenderness left anterior torso around rib 8-10 still present but much less today.  Neurological: She is alert and oriented to person, place, and time.  Skin: Skin is warm and dry.  Psychiatric: She has a normal mood and affect.  Symmetric normal motor tone is noted throughout. Normal muscle bulk. Muscle testing reveals 5/5 muscle strength of the upper extremity, and 5/5 of the lower extremity. Full range of motion in upper and lower extremities. ROM of spine is  mildly restricted. Fine motor movements are normal in both hands.  DTR in the upper and lower extremity are present and symmetric 2+. No clonus is noted.  Patient arises from chair without difficulty. Narrow based gait with normal arm swing bilateral , able to walk on heels and toes . Tandem walk is stable. No pronator drift. Rhomberg negative.        Assessment & Plan:  1. Cervical postlaminectomy syndrome. No evidence of radiculopathy.  2. Fibromyalgia , increased muscle pain, advised patient to keep attention to a good posture , showed her some relaxing exercises for her muscles, prescribed Robaxin 500mg  tid, prn muscle cramps at last visit, which has helped. The patient reports that the magnesium I recommended at the last visit has helped her with the muscle spasms and also has given her more energy.  Doing well on the current pain medications with no signs of aberrant drug behavior. Will continue using with monthly visits. Advised patient to continue with her walking and exercises program, as pain permits.  3. Fall on left anterior torso, possible bruised or fractured rib, ordered x-ray of left ribs, which did not show any significant findings.  Follow up with PA .  Continue MS Contin 3 times a day  Gabapentin 600 mg 3 times per day

## 2013-01-21 ENCOUNTER — Encounter: Payer: Self-pay | Admitting: Physical Medicine and Rehabilitation

## 2013-01-21 ENCOUNTER — Encounter
Payer: Medicare Other | Attending: Physical Medicine and Rehabilitation | Admitting: Physical Medicine and Rehabilitation

## 2013-01-21 VITALS — BP 144/58 | HR 76 | Resp 14 | Ht 65.0 in | Wt 232.0 lb

## 2013-01-21 DIAGNOSIS — K219 Gastro-esophageal reflux disease without esophagitis: Secondary | ICD-10-CM | POA: Insufficient documentation

## 2013-01-21 DIAGNOSIS — M961 Postlaminectomy syndrome, not elsewhere classified: Secondary | ICD-10-CM | POA: Insufficient documentation

## 2013-01-21 DIAGNOSIS — E785 Hyperlipidemia, unspecified: Secondary | ICD-10-CM | POA: Insufficient documentation

## 2013-01-21 DIAGNOSIS — E039 Hypothyroidism, unspecified: Secondary | ICD-10-CM | POA: Insufficient documentation

## 2013-01-21 DIAGNOSIS — I10 Essential (primary) hypertension: Secondary | ICD-10-CM | POA: Insufficient documentation

## 2013-01-21 DIAGNOSIS — IMO0001 Reserved for inherently not codable concepts without codable children: Secondary | ICD-10-CM | POA: Insufficient documentation

## 2013-01-21 MED ORDER — DICLOFENAC SODIUM 1 % TD GEL: 2.0000 g | Freq: Four times a day (QID) | TRANSDERMAL | Status: DC

## 2013-01-21 MED ORDER — MORPHINE SULFATE ER 15 MG PO TBCR: 15.0000 mg | EXTENDED_RELEASE_TABLET | Freq: Three times a day (TID) | ORAL | Status: DC

## 2013-01-21 NOTE — Patient Instructions (Signed)
Continue with your walking program. 

## 2013-01-21 NOTE — Progress Notes (Signed)
Subjective:    Patient ID: [name], female    DOB: 07-02-1950, 62 y.o.   MRN: [id number]  HPI The patient complains about chronic back pain . The patient denies any radiation. The patient also complains about numbness and tingling in all of her fingers and toes intermittendly.  The problem has been stable, with good and bad days. Patient states, that the Robaxin, I prescribed has helped with the muscle pain in her mid back paraspinal. She also states that the magnesium I recommended at the last visit has given her relief. She states that she is walking and exercising more now.  Pain Inventory Average Pain 4 Pain Right Now 4 My pain is constant, burning and aching  In the last 24 hours, has pain interfered with the following? General activity 5 Relation with others 5 Enjoyment of life 5 What TIME of day is your pain at its worst? morning, evening Sleep (in general) Fair  Pain is worse with: walking, bending, sitting, standing and some activites Pain improves with: rest, heat/ice, therapy/exercise, pacing activities, medication, TENS and injections Relief from Meds: 6  Mobility walk without assistance how many minutes can you walk? 10 ability to climb steps?  yes do you drive?  yes Do you have any goals in this area?  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  yes  Neuro/Psych bladder control problems weakness spasms depression  Prior Studies Any changes since last visit?  no  Physicians involved in your care Any changes since last visit?  no   Family History  Problem Relation Age of Onset  . Breast cancer Mother   . Diabetes Mother   . Heart disease Mother   . Diabetes Father   . Heart disease Father   . Stomach cancer Maternal Uncle   . Breast cancer Paternal Aunt   . Colon cancer Neg Hx    History   Social History  .  Marital Status: Married    Spouse Name: N/A    Number of Children: N/A  . Years of Education: N/A   Social History Main Topics  . Smoking status: Never Smoker   . Smokeless tobacco: Never Used  . Alcohol Use: No  . Drug Use: No  . Sexually Active: None   Other Topics Concern  . None   Social History Narrative  . None   Past Surgical History  Procedure Laterality Date  . Appendectomy    . Cholecystectomy    . Tubal ligation    . Tonsillectomy and adenoidectomy     Past Medical History  Diagnosis Date  . GERD (gastroesophageal reflux disease)   . Unspecified essential hypertension   . Fibromyalgia   . B12 deficiency   . Depression   . Irritable bowel syndrome   . Hyperlipemia   . Migraines   . HYPOTHYROIDISM 05/29/2007   BP 144/58  Pulse 76  Resp 14  Ht 5\' 5"  (1.651 m)  Wt 232 lb (105.235 kg)  BMI 38.61 kg/m2  SpO2 97%     Review of Systems  Constitutional: Positive for unexpected weight change.  Gastrointestinal: Positive for constipation.  Genitourinary: Positive for difficulty urinating.  Neurological: Positive for weakness.  Psychiatric/Behavioral: Positive for dysphoric mood.  All other systems reviewed and are negative.       Objective:   Physical Exam Constitutional: She is oriented to  person, place, and time. She appears well-developed and well-nourished.  HENT:  Head: Normocephalic.  Neck: Neck supple.  Musculoskeletal: She exhibits tenderness left anterior torso around rib 8-10 still present but much less today.  Neurological: She is alert and oriented to person, place, and time.  Skin: Skin is warm and dry.  Psychiatric: She has a normal mood and affect.  Symmetric normal motor tone is noted throughout. Normal muscle bulk. Muscle testing reveals 5/5 muscle strength of the upper extremity, and 5/5 of the lower extremity. Full range of motion in upper and lower extremities. ROM of spine is mildly restricted. Fine motor movements are normal in  both hands.  DTR in the upper and lower extremity are present and symmetric 2+. No clonus is noted.  Patient arises from chair without difficulty. Narrow based gait with normal arm swing bilateral , able to walk on heels and toes . Tandem walk is stable. No pronator drift. Rhomberg negative.        Assessment & Plan:  1. Cervical postlaminectomy syndrome. No evidence of radiculopathy.  2. Fibromyalgia , increased muscle pain, advised patient to keep attention to a good posture , showed her some relaxing exercises for her muscles, prescribed Robaxin 500mg  tid, prn muscle cramps at last visit, which has helped. The patient reports that the magnesium I recommended at the last visit has helped her with the muscle spasms and also has given her more energy.  Doing well on the current pain medications with no signs of aberrant drug behavior. Will continue using with monthly visits. Advised patient to continue with her walking and exercises program, as pain permits.  3. Fall on left anterior torso ,2 month ago, possible bruised or fractured rib, ordered x-ray of left ribs, which did not show any significant findings.  Follow up with PA in 1 month .  Refilled MS Contin 15mg , 3 times a day, refilled Voltaren gel  Continue Gabapentin 600 mg 3 times per day

## 2013-02-15 ENCOUNTER — Telehealth: Payer: Self-pay

## 2013-02-15 NOTE — Telephone Encounter (Signed)
noted 

## 2013-02-15 NOTE — Telephone Encounter (Signed)
Rabeprazole requires prior authorization; form in Dr Copland's in box.

## 2013-02-19 NOTE — Telephone Encounter (Signed)
Approval letter received; [place] Dr Patsy Lager signed and sent for scanning.

## 2013-02-20 ENCOUNTER — Encounter
Payer: Medicare Other | Attending: Physical Medicine and Rehabilitation | Admitting: Physical Medicine and Rehabilitation

## 2013-02-20 ENCOUNTER — Encounter: Payer: Self-pay | Admitting: Physical Medicine and Rehabilitation

## 2013-02-20 VITALS — BP 161/61 | HR 86 | Resp 14 | Ht 66.0 in | Wt 235.2 lb

## 2013-02-20 DIAGNOSIS — M797 Fibromyalgia: Secondary | ICD-10-CM

## 2013-02-20 DIAGNOSIS — Z79899 Other long term (current) drug therapy: Secondary | ICD-10-CM | POA: Insufficient documentation

## 2013-02-20 DIAGNOSIS — IMO0001 Reserved for inherently not codable concepts without codable children: Secondary | ICD-10-CM | POA: Insufficient documentation

## 2013-02-20 DIAGNOSIS — M961 Postlaminectomy syndrome, not elsewhere classified: Secondary | ICD-10-CM | POA: Insufficient documentation

## 2013-02-20 MED ORDER — METHOCARBAMOL 500 MG PO TABS: ORAL_TABLET | ORAL | Status: DC

## 2013-02-20 MED ORDER — MORPHINE SULFATE ER 15 MG PO TBCR: 15.0000 mg | EXTENDED_RELEASE_TABLET | Freq: Three times a day (TID) | ORAL | Status: DC

## 2013-02-20 NOTE — Progress Notes (Signed)
Subjective:    Patient ID: [name], female    DOB: 1951-06-05, 61 y.o.   MRN: [id number]  HPI The patient complains about chronic back pain . The patient denies any radiation. The patient also complains about numbness and tingling in all of her fingers and toes intermittendly.  The problem has been stable, with good and bad days. Patient states, that the Robaxin, I prescribed has helped with the muscle pain in her mid back paraspinal. She also states that the magnesium I recommended at the last visit has given her relief. She states that she is walking and exercising more now. The patient can not tolerate Flexeril, she gets jittery, tachycardic and nauseated rom this muscle relaxant.  Pain Inventory Average Pain 4 Pain Right Now 3 My pain is constant, burning and aching  In the last 24 hours, has pain interfered with the following? General activity 5 Relation with others 5 Enjoyment of life 6 What TIME of day is your pain at its worst? morning, night Sleep (in general) Fair  Pain is worse with: walking, bending, sitting, standing and some activites Pain improves with: rest, heat/ice, therapy/exercise, pacing activities, medication, TENS and injections Relief from Meds: 6  Mobility walk without assistance how many minutes can you walk? 10-15 Do you have any goals in this area?  yes  Function employed # of hrs/week 10-12 disabled: date disabled 12-2008 I need assistance with the following:  meal prep, household duties and shopping Do you have any goals in this area?  yes  Neuro/Psych bladder control problems weakness numbness spasms depression  Prior Studies Any changes since last visit?  no  Physicians involved in your care Any changes since last visit?  no   Family History  Problem Relation Age of Onset  . Breast cancer Mother   . Diabetes Mother   . Heart disease Mother   . Diabetes Father   . Heart disease Father   . Stomach cancer Maternal Uncle   .  Breast cancer Paternal Aunt   . Colon cancer Neg Hx    History   Social History  . Marital Status: Married    Spouse Name: N/A    Number of Children: N/A  . Years of Education: N/A   Social History Main Topics  . Smoking status: Never Smoker   . Smokeless tobacco: Never Used  . Alcohol Use: No  . Drug Use: No  . Sexual Activity: None   Other Topics Concern  . None   Social History Narrative  . None   Past Surgical History  Procedure Laterality Date  . Appendectomy    . Cholecystectomy    . Tubal ligation    . Tonsillectomy and adenoidectomy     Past Medical History  Diagnosis Date  . GERD (gastroesophageal reflux disease)   . Unspecified essential hypertension   . Fibromyalgia   . B12 deficiency   . Depression   . Irritable bowel syndrome   . Hyperlipemia   . Migraines   . HYPOTHYROIDISM 05/29/2007   BP 161/61  Pulse 86  Resp 14  Ht 5\' 6"  (1.676 m)  Wt 235 lb 3.2 oz (106.686 kg)  BMI 37.98 kg/m2  SpO2 97%     Review of Systems  Constitutional: Positive for unexpected weight change.  Cardiovascular: Positive for leg swelling.  Gastrointestinal: Positive for constipation.  Genitourinary:       Bladder control problems  Neurological: Positive for weakness and numbness.  Spasms  Psychiatric/Behavioral: Positive for dysphoric mood.       Objective:   Physical Exam Constitutional: She is oriented to person, place, and time. She appears well-developed and well-nourished.  HENT:  Head: Normocephalic.  Neck: Neck supple.  Musculoskeletal: She exhibits tenderness in her descendence trapezius bilateral Neurological: She is alert and oriented to person, place, and time.  Skin: Skin is warm and dry.  Psychiatric: She has a normal mood and affect.  Symmetric normal motor tone is noted throughout. Normal muscle bulk. Muscle testing reveals 5/5 muscle strength of the upper extremity, and 5/5 of the lower extremity. Full range of motion in upper and  lower extremities. ROM of spine is mildly restricted. Fine motor movements are normal in both hands.  DTR in the upper and lower extremity are present and symmetric 2+. No clonus is noted.  Patient arises from chair without difficulty. Narrow based gait with normal arm swing bilateral , able to walk on heels and toes . Tandem walk is stable. No pronator drift. Rhomberg negative.        Assessment & Plan:  1. Cervical postlaminectomy syndrome. No evidence of radiculopathy.  2. Fibromyalgia , increased muscle pain, advised patient to keep attention to a good posture , showed her some relaxing exercises for her muscles, prescribed Robaxin 500mg  tid, prn muscle cramps at last visit, which has helped.Her insurance does not want to pay for this medication, but the patient can not tolerate Flexeril, she gets jittery, tachycardic and nauseated and very drowsy from this muscle relaxant.Zanaflex would most likely also make her too drowsy, I advised her to get a form for pre-authorization , for the Robaxin.  The patient reports that the magnesium I recommended at the last visit has helped her with the muscle spasms and also has given her more energy.  Doing well on the current pain medications with no signs of aberrant drug behavior. Will continue using with monthly visits. Advised patient to continue with her walking and exercises program, as pain permits.   Follow up with PA in 1 month .  Refilled MS Contin 15mg , 3 times a day, refilled Voltaren gel  Continue Gabapentin 600 mg 3 times per day

## 2013-02-20 NOTE — Patient Instructions (Signed)
Continue with your walking and stretching program as pain permits

## 2013-03-06 ENCOUNTER — Telehealth: Payer: Self-pay

## 2013-03-06 ENCOUNTER — Encounter: Payer: Self-pay | Admitting: Family Medicine

## 2013-03-06 ENCOUNTER — Ambulatory Visit (INDEPENDENT_AMBULATORY_CARE_PROVIDER_SITE_OTHER): Payer: Medicare Other | Admitting: Family Medicine

## 2013-03-06 VITALS — BP 118/74 | HR 72 | Temp 98.8°F | Ht 66.0 in | Wt 234.8 lb

## 2013-03-06 DIAGNOSIS — L0293 Carbuncle, unspecified: Secondary | ICD-10-CM

## 2013-03-06 DIAGNOSIS — J019 Acute sinusitis, unspecified: Secondary | ICD-10-CM

## 2013-03-06 DIAGNOSIS — L0292 Furuncle, unspecified: Secondary | ICD-10-CM

## 2013-03-06 MED ORDER — LEVOFLOXACIN 500 MG PO TABS: 500.0000 mg | ORAL_TABLET | Freq: Every day | ORAL | Status: DC

## 2013-03-06 NOTE — Telephone Encounter (Signed)
Amy at [facility] left v/m to verify pt should take Levaquin 500 mg or be prescribed different antibiotic. Pt has hx of PVCs and heart murmur; Levaquin can have a strong interaction with Celexa; can prolong QT interval and risk of increased arrythmia. Amy request cb.

## 2013-03-06 NOTE — Telephone Encounter (Signed)
Discussed with Rob. Her QTc is normal on EKG - should be ok. Low risk.   Hannah Beat, MD 03/06/2013, 2:22 PM

## 2013-03-06 NOTE — Progress Notes (Signed)
Nature conservation officer at [facility] [address] [place] [address] Phone: [phone] Fax: [phone]  Date:  03/06/2013   Name:  [name]   DOB:  19-Feb-1951   MRN:  [id number] Gender: female Age: 62 y.o.  Primary Physician:  Hannah Beat, MD  Evaluating MD: Hannah Beat, MD   Chief Complaint: check ear   History of Present Illness:  [name] is a 62 y.o. pleasant patient who presents with the following:  Big knot behind ear, swollen gland. Has had 2 boils in her groin. Still has a knot in the groin on the right. Several in her scalp, and they are hurting a little bit.  Also having a lot of bad sinus pain > 1 week. Congestion and dripping from nose. No significant cough.  Patient Active Problem List   Diagnosis Date Noted  . 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 Postlaminectomy syndrome, cervical region 08/29/2011  . FATIGUE 05/04/2009  . TRANSAMINASES, SERUM, ELEVATED 12/24/2008  . DEPRESSION 09/22/2008  . ALLERGIC RHINITIS 09/22/2008  . HYPOTHYROIDISM 05/29/2007  . HYPERLIPIDEMIA 05/29/2007  . DEFICIENCY, VITAMIN D NOS 01/11/2007  . FIBROMYALGIA 01/11/2007  . Other B-Complex Deficiencies 11/13/2006  . HYPERTENSION 11/13/2006  . GERD 11/13/2006    Past Medical History  Diagnosis Date  . GERD (gastroesophageal reflux disease)   . Unspecified essential hypertension   . Fibromyalgia   . B12 deficiency   . Depression   . Irritable bowel syndrome   . Hyperlipemia   . Migraines   . HYPOTHYROIDISM 05/29/2007    Past Surgical History  Procedure Laterality Date  . Appendectomy    . Cholecystectomy    . Tubal ligation    . Tonsillectomy and adenoidectomy      History   Social History  . Marital Status: Married    Spouse Name: N/A    Number of Children: N/A  . Years of Education: N/A   Occupational History  . Not on file.   Social History Main Topics  . Smoking status: Never Smoker   .  Smokeless tobacco: Never Used  . Alcohol Use: No  . Drug Use: No  . Sexual Activity: Not on file   Other Topics Concern  . Not on file   Social History Narrative  . No narrative on file    Family History  Problem Relation Age of Onset  . Breast cancer Mother   . Diabetes Mother   . Heart disease Mother   . Diabetes Father   . Heart disease Father   . Stomach cancer Maternal Uncle   . Breast cancer Paternal Aunt   . Colon cancer Neg Hx     Allergies  Allergen Reactions  . Clarithromycin   . Cyanocobalamin Er   . Cyclobenzaprine Hcl     REACTION: THROAT CLOSED,TOUNGE SWELLING  . Doxycycline   . Duloxetine   . Metronidazole   . Naproxen Sodium   . Naproxen Sodium   . Nsaids     REACTION: due to liver damage  . Oxycodone Hcl   . Penicillins   . Rizatriptan Benzoate   . Rosuvastatin     REACTION: MUSCLE ACHES  . Sumatriptan   . Tramadol   . Venlafaxine     Medication list has been reviewed and updated.  Outpatient Prescriptions Prior to Visit  Medication Sig Dispense Refill  . aspirin 81 MG tablet Take 81 mg by mouth daily.        Marland Kitchen  Calcium Carbonate (CALCIUM 600 PO) Take by mouth 2 (two) times daily.       . cetirizine (ZYRTEC) 10 MG tablet Take 10 mg by mouth daily.        . Cholecalciferol (VITAMIN D) 2000 UNITS tablet Take 2,000 Units by mouth daily.        . citalopram (CELEXA) 40 MG tablet TAKE ONE TABLET BY MOUTH ONE TIME DAILY  90 tablet  3  . Coenzyme Q10 (COQ-10) 50 MG CAPS Take 1 capsule by mouth 3 (three) times daily.       . diclofenac sodium (VOLTAREN) 1 % GEL Apply 2 g topically 4 (four) times daily.  2 Tube  2  . docusate sodium (COLACE) 100 MG capsule Take 100 mg by mouth 3 (three) times daily as needed.        . gabapentin (NEURONTIN) 600 MG tablet Take 1 tablet (600 mg total) by mouth 3 (three) times daily.  270 tablet  2  . loratadine (CLARITIN) 10 MG tablet Take 10 mg by mouth daily.      . Melaton-Thean-Cham-PassF-LBalm (MELATONIN +  L-THEANINE PO) Take by mouth at bedtime. 3mg /25mg  tablet       . metroNIDAZOLE (METROCREAM) 0.75 % cream       . mirtazapine (REMERON) 15 MG tablet TAKE 1 TABLET AT BEDTIME  90 tablet  3  . morphine (MS CONTIN) 15 MG 12 hr tablet Take 1 tablet (15 mg total) by mouth 3 (three) times daily.  90 tablet  0  . RABEprazole (ACIPHEX) 20 MG tablet Take 1 tablet by mouth daily.      Marland Kitchen SYNTHROID 50 MCG tablet TAKE 1 TABLET DAILY  90 tablet  3  . methocarbamol (ROBAXIN) 500 MG tablet TAKE ONE TABLET BY MOUTH THREE TIMES DAILY  90 tablet  0  . dexlansoprazole (DEXILANT) 60 MG capsule Take 1 capsule (60 mg total) by mouth daily.  90 capsule  2   No facility-administered medications prior to visit.    Review of Systems:  ROS: GEN: Acute illness details above GI: Tolerating PO intake GU: maintaining adequate hydration and urination Pulm: No SOB Interactive and getting along well at home.  Otherwise, ROS is as per the HPI.   Physical Examination: BP 118/74  Pulse 72  Temp(Src) 98.8 F (37.1 C) (Oral)  Ht 5\' 6"  (1.676 m)  Wt 234 lb 12 oz (106.482 kg)  BMI 37.91 kg/m2  Ideal Body Weight: Weight in (lb) to have BMI = 25: 154.6   Gen: WDWN, NAD; alert,appropriate and cooperative throughout exam  HEENT: Normocephalic and atraumatic.  SCALP: multiple areas of elevated reddened lesions c/w boils in scalp and scalp line, 1 lymph node behind R ear Throat clear, w/o exudate, no LAD, R TM clear, L TM - good landmarks, No fluid present. rhinnorhea.  Left frontal and maxillary sinuses: Tender Right frontal and maxillary sinuses: Tender  Neck: No ant or post LAD CV: RRR, No M/G/R Pulm: Breathing comfortably in no resp distress. no w/c/r Abd: S,NT,ND,+BS Extr: no c/c/e Psych: full affect, pleasant   Assessment and Plan:  Recurrent boils  Acute sinusitis  Hopefully lvq will cover both  Orders Today:  No orders of the defined types were placed in this encounter.    Updated Medication  List: (Includes new medications, updates to list, dose adjustments) Meds ordered this encounter  Medications  . methocarbamol (ROBAXIN) 500 MG tablet    Sig: 3 (three) times daily as needed.  Marland Kitchen levofloxacin (LEVAQUIN)  500 MG tablet    Sig: Take 1 tablet (500 mg total) by mouth daily.    Dispense:  10 tablet    Refill:  0    Medications Discontinued: Medications Discontinued During This Encounter  Medication Reason  . methocarbamol (ROBAXIN) 500 MG tablet       Signed, Excell Neyland T. Almando Brawley, MD 03/06/2013 10:08 AM

## 2013-03-18 ENCOUNTER — Telehealth: Payer: Self-pay

## 2013-03-18 MED ORDER — CEPHALEXIN 500 MG PO CAPS: 1000.0000 mg | ORAL_CAPSULE | Freq: Two times a day (BID) | ORAL | Status: DC

## 2013-03-18 NOTE — Telephone Encounter (Signed)
Spoke with [name].  She believes she has taken Keflex in the past and did okay with it.  She did have anaphylaxis with penicillin.

## 2013-03-18 NOTE — Telephone Encounter (Signed)
Patient notified Keflex has been sent to her pharmacy.  Instruction on how to take the antibiotic given.  [name] would like to know if Dr. Patsy Lager thinks these boils could be a reaction to a medication.  She states these boils coinsides with when she started taking Robaxin.  Will forward to Dr. Patsy Lager to review.

## 2013-03-18 NOTE — Telephone Encounter (Signed)
Patient notified as instructed by telephone. 

## 2013-03-18 NOTE — Telephone Encounter (Signed)
Pt left v/m was seen on 03/06/13; pt finished antibiotics on 03/15/13, pt is still getting new boils formed rt groin .Boils on head and face have healed; boil in groin is better but not healed. No drainage noted. Rt ear is extremely red and sore, cannot see knot on rt ear now. Pt has not taken temp but pt believes has low grade temp.[place]. Pt request cb.

## 2013-03-18 NOTE — Telephone Encounter (Signed)
She has a huge allergy list.  Can you see if she can take cephalosporins (ie. Keflex)? Does she get anaphylaxis from penicillin.

## 2013-03-18 NOTE — Telephone Encounter (Signed)
no

## 2013-03-18 NOTE — Telephone Encounter (Signed)
Let us extend abx  Keflex 500 mg, 2 tabs po bid, #40, 0 ref

## 2013-03-20 ENCOUNTER — Encounter: Payer: Self-pay | Admitting: Physical Medicine and Rehabilitation

## 2013-03-20 ENCOUNTER — Encounter
Payer: Medicare Other | Attending: Physical Medicine and Rehabilitation | Admitting: Physical Medicine and Rehabilitation

## 2013-03-20 VITALS — BP 138/63 | HR 72 | Resp 14 | Ht 66.0 in | Wt 233.4 lb

## 2013-03-20 DIAGNOSIS — Z79899 Other long term (current) drug therapy: Secondary | ICD-10-CM | POA: Insufficient documentation

## 2013-03-20 DIAGNOSIS — I10 Essential (primary) hypertension: Secondary | ICD-10-CM | POA: Insufficient documentation

## 2013-03-20 DIAGNOSIS — M961 Postlaminectomy syndrome, not elsewhere classified: Secondary | ICD-10-CM | POA: Insufficient documentation

## 2013-03-20 DIAGNOSIS — E785 Hyperlipidemia, unspecified: Secondary | ICD-10-CM | POA: Insufficient documentation

## 2013-03-20 DIAGNOSIS — K219 Gastro-esophageal reflux disease without esophagitis: Secondary | ICD-10-CM | POA: Insufficient documentation

## 2013-03-20 DIAGNOSIS — IMO0001 Reserved for inherently not codable concepts without codable children: Secondary | ICD-10-CM | POA: Insufficient documentation

## 2013-03-20 DIAGNOSIS — R209 Unspecified disturbances of skin sensation: Secondary | ICD-10-CM | POA: Insufficient documentation

## 2013-03-20 DIAGNOSIS — E039 Hypothyroidism, unspecified: Secondary | ICD-10-CM | POA: Insufficient documentation

## 2013-03-20 MED ORDER — MORPHINE SULFATE ER 15 MG PO TBCR: 15.0000 mg | EXTENDED_RELEASE_TABLET | Freq: Three times a day (TID) | ORAL | Status: DC

## 2013-03-20 NOTE — Progress Notes (Signed)
Subjective:    Patient ID: [name], female    DOB: Oct 10, 1950, 62 y.o.   MRN: [id number]  HPI The patient complains about chronic back pain . The patient denies any radiation. The patient also complains about numbness and tingling in all of her fingers and toes intermittendly.  The problem has been stable, with good and bad days. Patient states, that the Robaxin, I prescribed has helped with the muscle pain in her mid back paraspinal. She also states that the magnesium I recommended at the last visit has given her relief. She states that she is walking and exercising usually, but because she was sick she did not exercise much in the last 2 weeks.  The patient reports that she had some kind of bacterial infection in the last 2 weeks, she was first taking clarithromycin, which did not resolve the infection, now she is on keflex.   Pain Inventory Average Pain 4 Pain Right Now 5 My pain is constant, burning and aching  In the last 24 hours, has pain interfered with the following? General activity 6 Relation with others 6 Enjoyment of life 6 What TIME of day is your pain at its worst? morning and night Sleep (in general) Fair  Pain is worse with: walking, bending, sitting, standing and some activites Pain improves with: rest, heat/ice, therapy/exercise, pacing activities, medication, TENS and injections Relief from Meds: 7  Mobility walk without assistance how many minutes can you walk? 10 ability to climb steps?  yes do you drive?  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  no  Physicians involved in your care Any changes since last visit?  no   Family History  Problem Relation Age of Onset  . Breast cancer Mother   . Diabetes  Mother   . Heart disease Mother   . Diabetes Father   . Heart disease Father   . Stomach cancer Maternal Uncle   . Breast cancer Paternal Aunt   . Colon cancer Neg Hx    History   Social History  . Marital Status: Married    Spouse Name: N/A    Number of Children: N/A  . Years of Education: N/A   Social History Main Topics  . Smoking status: Never Smoker   . Smokeless tobacco: Never Used  . Alcohol Use: No  . Drug Use: No  . Sexual Activity: None   Other Topics Concern  . None   Social History Narrative  . None   Past Surgical History  Procedure Laterality Date  . Appendectomy    . Cholecystectomy    . Tubal ligation    . Tonsillectomy and adenoidectomy     Past Medical History  Diagnosis Date  . GERD (gastroesophageal reflux disease)   . Unspecified essential hypertension   . Fibromyalgia   . B12 deficiency   . Depression   . Irritable bowel syndrome   . Hyperlipemia   . Migraines   . HYPOTHYROIDISM 05/29/2007   BP 138/63  Pulse 72  Resp 14  Ht 5\' 6"  (1.676 m)  Wt 233 lb 6.4 oz (105.87 kg)  BMI 37.69 kg/m2  SpO2 98%   Review of Systems  Cardiovascular: Positive for leg swelling.  Genitourinary:       Bladder control problems  Musculoskeletal:       Spasms  Neurological: Positive for tremors, weakness and numbness.  Psychiatric/Behavioral: Positive for dysphoric mood.  All other systems reviewed and are negative.       Objective:   Physical Exam Constitutional: She is oriented to person, place, and time. She appears well-developed and well-nourished.  HENT:  Head: Normocephalic.  Neck: Neck supple.  Musculoskeletal: She exhibits tenderness in her descendence trapezius bilateral Neurological: She is alert and oriented to person, place, and time.  Skin: Skin is warm and dry.  Psychiatric: She has a normal mood and affect.  Symmetric normal motor tone is noted throughout. Normal muscle bulk. Muscle testing reveals 5/5 muscle strength of the  upper extremity, and 5/5 of the lower extremity. Full range of motion in upper and lower extremities. ROM of spine is mildly restricted. Fine motor movements are normal in both hands.  DTR in the upper and lower extremity are present and symmetric 2+. No clonus is noted.  Patient arises from chair without difficulty. Narrow based gait with normal arm swing bilateral , able to walk on heels and toes . Tandem walk is stable. No pronator drift. Rhomberg negative.        Assessment & Plan:  1. Cervical postlaminectomy syndrome. No evidence of radiculopathy.  2. Fibromyalgia , increased muscle pain, advised patient to keep attention to a good posture , showed her some relaxing exercises for her muscles, prescribed Robaxin 500mg  tid, prn muscle cramps at last visit, which has helped.Her insurance does not want to pay for this medication, but the patient can not tolerate Flexeril, she gets jittery, tachycardic and nauseated and very drowsy from this muscle relaxant.Zanaflex would most likely also make her too drowsy, I advised her to get a form for pre-authorization , for the Robaxin.  The patient reports that the magnesium I recommended at the last visit has helped her with the muscle spasms and also has given her more energy.  Doing well on the current pain medications with no signs of aberrant drug behavior. Will continue using with monthly visits. Advised patient to continue with her walking and exercises program, as pain permits, and when she feels better after her bacterial infection in the last 2 weeks.  Follow up with PA in 1 month .  Refilled MS Contin 15mg , 3 times a day, continue Voltaren gel  Continue Gabapentin 600 mg 3 times per day

## 2013-03-20 NOTE — Patient Instructions (Signed)
Continue with your  Walking program, when you feel better again

## 2013-04-03 ENCOUNTER — Ambulatory Visit (INDEPENDENT_AMBULATORY_CARE_PROVIDER_SITE_OTHER): Payer: Medicare Other | Admitting: Family Medicine

## 2013-04-03 ENCOUNTER — Encounter: Payer: Self-pay | Admitting: Family Medicine

## 2013-04-03 VITALS — BP 116/62 | HR 78 | Temp 98.8°F | Ht 66.0 in | Wt 233.5 lb

## 2013-04-03 DIAGNOSIS — E538 Deficiency of other specified B group vitamins: Secondary | ICD-10-CM

## 2013-04-03 DIAGNOSIS — R61 Generalized hyperhidrosis: Secondary | ICD-10-CM

## 2013-04-03 DIAGNOSIS — Z79899 Other long term (current) drug therapy: Secondary | ICD-10-CM

## 2013-04-03 DIAGNOSIS — IMO0001 Reserved for inherently not codable concepts without codable children: Secondary | ICD-10-CM

## 2013-04-03 DIAGNOSIS — E559 Vitamin D deficiency, unspecified: Secondary | ICD-10-CM

## 2013-04-03 DIAGNOSIS — R739 Hyperglycemia, unspecified: Secondary | ICD-10-CM

## 2013-04-03 DIAGNOSIS — R7309 Other abnormal glucose: Secondary | ICD-10-CM

## 2013-04-03 DIAGNOSIS — E785 Hyperlipidemia, unspecified: Secondary | ICD-10-CM

## 2013-04-03 DIAGNOSIS — E782 Mixed hyperlipidemia: Secondary | ICD-10-CM

## 2013-04-03 DIAGNOSIS — I10 Essential (primary) hypertension: Secondary | ICD-10-CM

## 2013-04-03 DIAGNOSIS — E039 Hypothyroidism, unspecified: Secondary | ICD-10-CM

## 2013-04-03 LAB — HEPATIC FUNCTION PANEL
ALT: 62 U/L — ABNORMAL HIGH (ref 0–35)
AST: 63 U/L — ABNORMAL HIGH (ref 0–37)
Albumin: 3.8 g/dL (ref 3.5–5.2)
Alkaline Phosphatase: 92 U/L (ref 39–117)
Bilirubin, Direct: 0 mg/dL (ref 0.0–0.3)
Total Bilirubin: 0.5 mg/dL (ref 0.3–1.2)
Total Protein: 7.5 g/dL (ref 6.0–8.3)

## 2013-04-03 LAB — CBC WITH DIFFERENTIAL/PLATELET
Basophils Absolute: 0 10*3/uL (ref 0.0–0.1)
Basophils Relative: 0.5 % (ref 0.0–3.0)
Eosinophils Absolute: 0.1 10*3/uL (ref 0.0–0.7)
Eosinophils Relative: 1.8 % (ref 0.0–5.0)
HCT: 37.4 % (ref 36.0–46.0)
Hemoglobin: 12.7 g/dL (ref 12.0–15.0)
Lymphocytes Relative: 26.5 % (ref 12.0–46.0)
Lymphs Abs: 1.9 10*3/uL (ref 0.7–4.0)
MCHC: 33.9 g/dL (ref 30.0–36.0)
MCV: 89.1 fl (ref 78.0–100.0)
Monocytes Absolute: 0.3 10*3/uL (ref 0.1–1.0)
Monocytes Relative: 4.8 % (ref 3.0–12.0)
Neutro Abs: 4.8 10*3/uL (ref 1.4–7.7)
Neutrophils Relative %: 66.4 % (ref 43.0–77.0)
Platelets: 274 10*3/uL (ref 150.0–400.0)
RBC: 4.2 Mil/uL (ref 3.87–5.11)
RDW: 12.6 % (ref 11.5–14.6)
WBC: 7.2 10*3/uL (ref 4.5–10.5)

## 2013-04-03 LAB — LIPID PANEL
Cholesterol: 180 mg/dL (ref 0–200)
HDL: 43.4 mg/dL (ref >39.00)
Total CHOL/HDL Ratio: 4
Triglycerides: 202 mg/dL — ABNORMAL HIGH (ref 0.0–149.0)
VLDL: 40.4 mg/dL — ABNORMAL HIGH (ref 0.0–40.0)

## 2013-04-03 LAB — BASIC METABOLIC PANEL
BUN: 14 mg/dL (ref 6–23)
CO2: 29 mEq/L (ref 19–32)
Calcium: 9.8 mg/dL (ref 8.4–10.5)
Chloride: 101 mEq/L (ref 96–112)
Creatinine, Ser: 0.8 mg/dL (ref 0.4–1.2)
GFR: 80.89 mL/min (ref >60.00)
Glucose, Bld: 168 mg/dL — ABNORMAL HIGH (ref 70–99)
Potassium: 4.1 mEq/L (ref 3.5–5.1)
Sodium: 141 mEq/L (ref 135–145)

## 2013-04-03 NOTE — Progress Notes (Signed)
Nature conservation officer at [facility] [address] [place] [address] Phone: [phone] Fax: [phone]  Date:  04/03/2013   Name:  [name]   DOB:  1950-09-21   MRN:  [id number] Gender: female Age: 62 y.o.  Primary Physician:  Hannah Beat, MD   Chief Complaint: Hormone Replacement Therapy and Follow-up   History of Present Illness:  [name] is a 62 y.o. pleasant patient who presents with the following:  Better and healing from skin infection. Resolved.  F/u Thyroid, switched to synthroid.   Thyroid: No symptoms. Labs reviewed. Denies cold / heat intolerance, dry skin, hair loss. No goiter.  Lab Results  Component Value Date   TSH 3.18 11/07/2012     Hysterectomy. Mother had breast cancer. Had bad sweats - almost all the time. hyst and oophorectomy > 25 years ago  Lipids: Doing well, stable.   Lipids:    Component Value Date/Time   CHOL 180 04/03/2013 1307   TRIG 202.0* 04/03/2013 1307   HDL 43.40 04/03/2013 1307   LDLDIRECT 113.0 04/03/2013 1307   VLDL 40.4* 04/03/2013 1307   CHOLHDL 4 04/03/2013 1307    Lab Results  Component Value Date   ALT 62* 04/03/2013   AST 63* 04/03/2013   ALKPHOS 92 04/03/2013   BILITOT 0.5 04/03/2013     HTN: Tolerating all medications without side effects Stable and at goal No CP, no sob. No HA.  BP Readings from Last 3 Encounters:  04/03/13 116/62  03/20/13 138/63  03/06/13 118/74    Basic Metabolic Panel:    Component Value Date/Time   NA 141 04/03/2013 1307   K 4.1 04/03/2013 1307   CL 101 04/03/2013 1307   CO2 29 04/03/2013 1307   BUN 14 04/03/2013 1307   CREATININE 0.8 04/03/2013 1307   GLUCOSE 168* 04/03/2013 1307   CALCIUM 9.8 04/03/2013 1307      Patient Active Problem List   Diagnosis Date Noted  . 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 Postlaminectomy syndrome, cervical region 08/29/2011  . FATIGUE 05/04/2009  . TRANSAMINASES, SERUM, ELEVATED 12/24/2008    . DEPRESSION 09/22/2008  . ALLERGIC RHINITIS 09/22/2008  . HYPOTHYROIDISM 05/29/2007  . HYPERLIPIDEMIA 05/29/2007  . DEFICIENCY, VITAMIN D NOS 01/11/2007  . FIBROMYALGIA 01/11/2007  . Other B-Complex Deficiencies 11/13/2006  . HYPERTENSION 11/13/2006  . GERD 11/13/2006    Past Medical History  Diagnosis Date  . GERD (gastroesophageal reflux disease)   . Unspecified essential hypertension   . Fibromyalgia   . B12 deficiency   . Depression   . Irritable bowel syndrome   . Hyperlipemia   . Migraines   . HYPOTHYROIDISM 05/29/2007    Past Surgical History  Procedure Laterality Date  . Appendectomy    . Cholecystectomy    . Tubal ligation    . Tonsillectomy and adenoidectomy      History   Social History  . Marital Status: Married    Spouse Name: N/A    Number of Children: N/A  . Years of Education: N/A   Occupational History  . Not on file.   Social History Main Topics  . Smoking status: Never Smoker   . Smokeless tobacco: Never Used  . Alcohol Use: No  . Drug Use: No  . Sexual Activity: Not on file   Other Topics Concern  . Not on file   Social History Narrative  . No narrative on file    Family History  Problem Relation Age of Onset  . Breast cancer Mother   . Diabetes Mother   . Heart disease Mother   . Diabetes Father   . Heart disease Father   . Stomach cancer Maternal Uncle   . Breast cancer Paternal Aunt   . Colon cancer Neg Hx     Allergies  Allergen Reactions  . Clarithromycin   . Cyanocobalamin Er   . Cyclobenzaprine Hcl     REACTION: THROAT CLOSED,TOUNGE SWELLING  . Doxycycline   . Duloxetine   . Metronidazole   . Naproxen Sodium   . Naproxen Sodium   . Nsaids     REACTION: due to liver damage  . Oxycodone Hcl   . Penicillins   . Rizatriptan Benzoate   . Rosuvastatin     REACTION: MUSCLE ACHES  . Sumatriptan   . Tramadol   . Venlafaxine     Medication list has been reviewed and updated.  Outpatient Prescriptions  Prior to Visit  Medication Sig Dispense Refill  . aspirin 81 MG tablet Take 81 mg by mouth daily.        . Calcium Carbonate (CALCIUM 600 PO) Take by mouth 2 (two) times daily.       . cetirizine (ZYRTEC) 10 MG tablet Take 10 mg by mouth daily.        . Cholecalciferol (VITAMIN D) 2000 UNITS tablet Take 2,000 Units by mouth daily.        . citalopram (CELEXA) 40 MG tablet TAKE ONE TABLET BY MOUTH ONE TIME DAILY  90 tablet  3  . diclofenac sodium (VOLTAREN) 1 % GEL Apply 2 g topically 4 (four) times daily.  2 Tube  2  . docusate sodium (COLACE) 100 MG capsule Take 100 mg by mouth 3 (three) times daily as needed.        . gabapentin (NEURONTIN) 600 MG tablet Take 1 tablet (600 mg total) by mouth 3 (three) times daily.  270 tablet  2  . loratadine (CLARITIN) 10 MG tablet Take 10 mg by mouth daily.      . Melaton-Thean-Cham-PassF-LBalm (MELATONIN + L-THEANINE PO) Take by mouth at bedtime. 3mg /25mg  tablet       . methocarbamol (ROBAXIN) 500 MG tablet 3 (three) times daily as needed.      . metroNIDAZOLE (METROCREAM) 0.75 % cream       . mirtazapine (REMERON) 15 MG tablet TAKE 1 TABLET AT BEDTIME  90 tablet  3  . morphine (MS CONTIN) 15 MG 12 hr tablet Take 1 tablet (15 mg total) by mouth 3 (three) times daily.  90 tablet  0  . RABEprazole (ACIPHEX) 20 MG tablet Take 1 tablet by mouth daily.      Marland Kitchen SYNTHROID 50 MCG tablet TAKE 1 TABLET DAILY  90 tablet  3  . Coenzyme Q10 (COQ-10) 50 MG CAPS Take 1 capsule by mouth 3 (three) times daily.       Marland Kitchen dexlansoprazole (DEXILANT) 60 MG capsule Take 1 capsule (60 mg total) by mouth daily.  90 capsule  2  . cephALEXin (KEFLEX) 500 MG capsule Take 2 capsules (1,000 mg total) by mouth 2 (two) times daily.  40 capsule  0   No facility-administered medications prior to visit.    Review of Systems:  As above. No fever. Sweats+ No cp, sob  Physical Examination: BP 116/62  Pulse 78  Temp(Src) 98.8 F (37.1 C) (Oral)  Ht 5\' 6"  (1.676 m)  Wt 233 lb 8 oz  (105.915  kg)  BMI 37.71 kg/m2  Ideal Body Weight: Weight in (lb) to have BMI = 25: 154.6   GEN: WDWN, NAD, Non-toxic, A & O x 3 HEENT: Atraumatic, Normocephalic. Neck supple. No masses, No LAD. Ears and Nose: No external deformity. CV: RRR, No M/G/R. No JVD. No thrill. No extra heart sounds. PULM: CTA B, no wheezes, crackles, rhonchi. No retractions. No resp. distress. No accessory muscle use. EXTR: No c/c/e NEURO Normal gait.  PSYCH: Normally interactive. Conversant. Not depressed or anxious appearing.  Calm demeanor.    Assessment and Plan:  Sweating - Plan: HIV antibody, Metanephrines, Pheochromocyt  Other and unspecified hyperlipidemia - Plan: Lipid panel  Encounter for long-term (current) use of other medications - Plan: CBC with Differential, Basic metabolic panel, Hepatic function panel  HYPOTHYROIDISM  Other B-complex deficiencies  DEFICIENCY, VITAMIN D NOS  HYPERLIPIDEMIA  HYPERTENSION  Hyperglycemia  Check a1c, elevated bs  Lipids and bp are stable  i don't have a source for sweating. When i looked up, morphine and ssri can do this. Work-up per utd recs  Change to generic synthroid  Orders Today:  Orders Placed This Encounter  Procedures  . CBC with Differential  . Basic metabolic panel  . Lipid panel  . Hepatic function panel  . HIV antibody  . Metanephrines, Pheochromocyt  . LDL cholesterol, direct    Updated Medication List: (Includes new medications, updates to list, dose adjustments) Meds ordered this encounter  Medications  . B Complex Vitamins (B COMPLEX 100 PO)    Sig: Take 1 tablet by mouth daily.  . Coenzyme Q-10 100 MG capsule    Sig: Take 100 mg by mouth daily.  Marland Kitchen levothyroxine (SYNTHROID, LEVOTHROID) 50 MCG tablet    Sig: Take 1 tablet (50 mcg total) by mouth daily.    Dispense:  90 tablet    Refill:  3    Medications Discontinued: Medications Discontinued During This Encounter  Medication Reason  . cephALEXin (KEFLEX) 500  MG capsule Completed Course  . Coenzyme Q10 (COQ-10) 50 MG CAPS Change in therapy  . SYNTHROID 50 MCG tablet       Signed,  Tanara Turvey T. Vicy Medico, MD-l

## 2013-04-04 LAB — HIV ANTIBODY (ROUTINE TESTING W REFLEX): HIV: NONREACTIVE

## 2013-04-04 LAB — LDL CHOLESTEROL, DIRECT: Direct LDL: 113 mg/dL

## 2013-04-04 MED ORDER — LEVOTHYROXINE SODIUM 50 MCG PO TABS: 50.0000 ug | ORAL_TABLET | Freq: Every day | ORAL | Status: DC

## 2013-04-05 ENCOUNTER — Ambulatory Visit: Payer: Medicare Other

## 2013-04-05 DIAGNOSIS — E1149 Type 2 diabetes mellitus with other diabetic neurological complication: Secondary | ICD-10-CM

## 2013-04-05 DIAGNOSIS — R7989 Other specified abnormal findings of blood chemistry: Secondary | ICD-10-CM

## 2013-04-05 LAB — METANEPHRINES, PHEOCHROMOCYT
Creatinine, Random U: 209.7 mg/dL (ref 15.0–278.0)
Metaneph Total, Ur: 172 ug/L
Metaneph/Creat Ratio: 0.4 UG/MG Creat (ref 0.0–1.0)
Normetanephrine, Ur: 497 ug/L

## 2013-04-05 LAB — HEMOGLOBIN A1C: Hgb A1c MFr Bld: 7.7 % — ABNORMAL HIGH (ref 4.6–6.5)

## 2013-04-07 DIAGNOSIS — E1140 Type 2 diabetes mellitus with diabetic neuropathy, unspecified: Secondary | ICD-10-CM | POA: Insufficient documentation

## 2013-04-08 MED ORDER — METFORMIN HCL 500 MG PO TABS: 500.0000 mg | ORAL_TABLET | Freq: Two times a day (BID) | ORAL | Status: DC

## 2013-04-22 ENCOUNTER — Encounter
Payer: Medicare Other | Attending: Physical Medicine and Rehabilitation | Admitting: Physical Medicine and Rehabilitation

## 2013-04-22 ENCOUNTER — Encounter: Payer: Self-pay | Admitting: Physical Medicine and Rehabilitation

## 2013-04-22 VITALS — BP 139/60 | HR 73 | Resp 14 | Ht 66.0 in | Wt 232.0 lb

## 2013-04-22 DIAGNOSIS — Z79899 Other long term (current) drug therapy: Secondary | ICD-10-CM

## 2013-04-22 DIAGNOSIS — Z5181 Encounter for therapeutic drug level monitoring: Secondary | ICD-10-CM

## 2013-04-22 DIAGNOSIS — IMO0001 Reserved for inherently not codable concepts without codable children: Secondary | ICD-10-CM | POA: Insufficient documentation

## 2013-04-22 DIAGNOSIS — M961 Postlaminectomy syndrome, not elsewhere classified: Secondary | ICD-10-CM

## 2013-04-22 DIAGNOSIS — G8929 Other chronic pain: Secondary | ICD-10-CM | POA: Insufficient documentation

## 2013-04-22 MED ORDER — MORPHINE SULFATE ER 15 MG PO TBCR: 15.0000 mg | EXTENDED_RELEASE_TABLET | Freq: Three times a day (TID) | ORAL | Status: DC

## 2013-04-22 NOTE — Progress Notes (Signed)
Subjective:    Patient ID: [name], female    DOB: [birth year], 62 y.o.   MRN: [id number]  HPI The patient complains about chronic back pain . The patient denies any radiation. The patient also complains about numbness and tingling in all of her fingers and toes intermittendly.  The problem has been stable, with good and bad days. Patient states, that the Robaxin, I prescribed has helped with the muscle pain in her mid back paraspinal. She also states that the magnesium I recommended at the last visit has given her relief. She states that she is walking and exercising regularly. She reports that she was diagnosed with DM.  Pain Inventory Average Pain 4 Pain Right Now 3 My pain is burning, stabbing and aching  In the last 24 hours, has pain interfered with the following? General activity 7 Relation with others 7 Enjoyment of life 8 What TIME of day is your pain at its worst? morning and evening Sleep (in general) Fair  Pain is worse with: walking, bending, sitting, standing and some activites Pain improves with: rest, heat/ice, therapy/exercise, pacing activities, medication, TENS and injections Relief from Meds: 7  Mobility walk without assistance how many minutes can you walk? 10 ability to climb steps?  yes do you drive?  yes Do you have any goals in this area?  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  yes  Neuro/Psych bladder control problems weakness numbness tremor tingling spasms depression  Prior Studies Any changes since last visit?  no  Physicians involved in your care Any changes since last visit?  no   Family History  Problem Relation Age of Onset  . Breast cancer Mother   . Diabetes Mother   . Heart disease Mother   . Diabetes Father   . Heart disease Father   . Stomach cancer Maternal Uncle   . Breast cancer  Paternal Aunt   . Colon cancer Neg Hx    History   Social History  . Marital Status: Married    Spouse Name: N/A    Number of Children: N/A  . Years of Education: N/A   Social History Main Topics  . Smoking status: Never Smoker   . Smokeless tobacco: Never Used  . Alcohol Use: No  . Drug Use: No  . Sexual Activity: None   Other Topics Concern  . None   Social History Narrative  . None   Past Surgical History  Procedure Laterality Date  . Appendectomy    . Cholecystectomy    . Tubal ligation    . Tonsillectomy and adenoidectomy     Past Medical History  Diagnosis Date  . GERD (gastroesophageal reflux disease)   . Unspecified essential hypertension   . Fibromyalgia   . B12 deficiency   . Depression   . Irritable bowel syndrome   . Hyperlipemia   . Migraines   . HYPOTHYROIDISM 05/29/2007  . Type II or unspecified type diabetes mellitus with neurological manifestations, not stated as uncontrolled(250.60) 04/07/2013   BP 139/60  Pulse 73  Resp 14  Ht 5\' 6"  (1.676 m)  Wt 232 lb (105.235 kg)  BMI 37.46 kg/m2  SpO2 99%     Review of Systems  Constitutional: Positive for unexpected weight change.  Cardiovascular: Positive for leg swelling.  Genitourinary: Positive for difficulty urinating.  Neurological: Positive for tremors, weakness  and numbness.  Psychiatric/Behavioral: Positive for dysphoric mood.  All other systems reviewed and are negative.       Objective:   Physical Exam Constitutional: She is oriented to person, place, and time. She appears well-developed and well-nourished.  HENT:  Head: Normocephalic.  Neck: Neck supple.  Musculoskeletal: She exhibits tenderness in her descendence trapezius bilateral Neurological: She is alert and oriented to person, place, and time.  Skin: Skin is warm and dry.  Psychiatric: She has a normal mood and affect.  Symmetric normal motor tone is noted throughout. Normal muscle bulk. Muscle testing reveals 5/5  muscle strength of the upper extremity, and 5/5 of the lower extremity. Full range of motion in upper and lower extremities. ROM of spine is mildly restricted. Fine motor movements are normal in both hands.  DTR in the upper and lower extremity are present and symmetric 2+. No clonus is noted.  Patient arises from chair without difficulty. Narrow based gait with normal arm swing bilateral , able to walk on heels and toes . Tandem walk is stable. No pronator drift. Rhomberg negative.        Assessment & Plan:  1. Cervical postlaminectomy syndrome. No evidence of radiculopathy.  2. Fibromyalgia , increased muscle pain, advised patient to keep attention to a good posture , showed her some relaxing exercises for her muscles, prescribed Robaxin 500mg  tid, prn muscle cramps at last visit, which has helped.Her insurance does not want to pay for this medication, but the patient can not tolerate Flexeril, she gets jittery, tachycardic and nauseated and very drowsy from this muscle relaxant.Zanaflex would most likely also make her too drowsy, I advised her to get a form for pre-authorization , for the Robaxin, which she did, she tolerates the Robaxin well, and it is helping her.  The patient reports that the magnesium I recommended at the last visit has helped her with the muscle spasms and also has given her more energy.  Doing well on the current pain medications with no signs of aberrant drug behavior. Will continue using with monthly visits. Advised patient to continue with her walking and exercises program, as pain permits. Patient was diagnosed with DM, I advised her to eat a healthy diet and exercise, to try to bring her BS back to a normal level. Also recommended that she should talk to her PCP to be referred to a nutritionist. Follow up with PA in 1 month .  Refilled MS Contin 15mg , 3 times a day, continue Voltaren gel  Continue Gabapentin 600 mg 3 times per day

## 2013-04-22 NOTE — Patient Instructions (Signed)
Continue with your walking program. 

## 2013-05-02 ENCOUNTER — Other Ambulatory Visit: Payer: Self-pay

## 2013-05-06 ENCOUNTER — Ambulatory Visit (INDEPENDENT_AMBULATORY_CARE_PROVIDER_SITE_OTHER): Payer: Medicare Other | Admitting: Family Medicine

## 2013-05-06 ENCOUNTER — Encounter: Payer: Self-pay | Admitting: Family Medicine

## 2013-05-06 VITALS — BP 106/68 | HR 73 | Temp 98.7°F | Ht 66.0 in | Wt 229.5 lb

## 2013-05-06 DIAGNOSIS — Z23 Encounter for immunization: Secondary | ICD-10-CM

## 2013-05-06 DIAGNOSIS — E1149 Type 2 diabetes mellitus with other diabetic neurological complication: Secondary | ICD-10-CM

## 2013-05-06 MED ORDER — FREESTYLE SYSTEM KIT: 1.0000 | PACK | Status: DC | PRN

## 2013-05-06 NOTE — Progress Notes (Signed)
Pre-visit discussion using our clinic review tool. No additional management support is needed unless otherwise documented below in the visit note.  

## 2013-05-06 NOTE — Patient Instructions (Signed)
F/u 3 months 

## 2013-05-06 NOTE — Progress Notes (Signed)
Date:  05/06/2013   Name:  [name]   DOB:  1950-12-12   MRN:  [id number] Gender: female Age: 62 y.o.  Primary Physician:  Hannah Beat, MD   Chief Complaint: Diabetes   Subjective:   History of Present Illness:  [name] is a 62 y.o. pleasant patient who presents with the following:  DM, new onset:   Pneumonia vaccine.   Glucometer.   The patient has new-onset diabetes, and she recently started metformin, she felt fairly nauseous for about 2 or 3 weeks, that is resolved. She does not have a glucometer yet. She does have 2 episodes where she felt like her blood sugar got very low. She got a little bit shaky at that point. She does know about a diabetic diet, 2 of her parents were diabetic, she went through some diabetic education years ago.  Wt Readings from Last 3 Encounters:  05/06/13 229 lb 8 oz (104.101 kg)  04/22/13 232 lb (105.235 kg)  04/03/13 233 lb 8 oz (105.915 kg)     Patient Active Problem List   Diagnosis Date Noted  . Type II or unspecified type diabetes mellitus with neurological manifestations, not stated as uncontrolled(250.60) 04/07/2013  . 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 Postlaminectomy syndrome, cervical region 08/29/2011  . FATIGUE 05/04/2009  . TRANSAMINASES, SERUM, ELEVATED 12/24/2008  . DEPRESSION 09/22/2008  . ALLERGIC RHINITIS 09/22/2008  . HYPOTHYROIDISM 05/29/2007  . HYPERLIPIDEMIA 05/29/2007  . DEFICIENCY, VITAMIN D NOS 01/11/2007  . FIBROMYALGIA 01/11/2007  . Other B-Complex Deficiencies 11/13/2006  . HYPERTENSION 11/13/2006  . GERD 11/13/2006    Past Medical History  Diagnosis Date  . GERD (gastroesophageal reflux disease)   . Unspecified essential hypertension   . Fibromyalgia   . B12 deficiency   . Depression   . Irritable bowel syndrome   . Hyperlipemia   . Migraines   . HYPOTHYROIDISM 05/29/2007  . Type II or unspecified type diabetes mellitus with neurological  manifestations, not stated as uncontrolled(250.60) 04/07/2013    Past Surgical History  Procedure Laterality Date  . Appendectomy    . Cholecystectomy    . Tubal ligation    . Tonsillectomy and adenoidectomy      History   Social History  . Marital Status: Married    Spouse Name: N/A    Number of Children: N/A  . Years of Education: N/A   Occupational History  . Not on file.   Social History Main Topics  . Smoking status: Never Smoker   . Smokeless tobacco: Never Used  . Alcohol Use: No  . Drug Use: No  . Sexual Activity: Not on file   Other Topics Concern  . Not on file   Social History Narrative  . No narrative on file    Family History  Problem Relation Age of Onset  . Breast cancer Mother   . Diabetes Mother   . Heart disease Mother   . Diabetes Father   . Heart disease Father   . Stomach cancer Maternal Uncle   . Breast cancer Paternal Aunt   . Colon cancer Neg Hx     Allergies  Allergen Reactions  . Clarithromycin   . Cyanocobalamin Er   . Cyclobenzaprine Hcl     REACTION: THROAT CLOSED,TOUNGE SWELLING  . Doxycycline   . Duloxetine   . Metronidazole   . Naproxen Sodium   . Naproxen Sodium   . Nsaids     REACTION: due to  liver damage  . Oxycodone Hcl   . Penicillins   . Rizatriptan Benzoate   . Rosuvastatin     REACTION: MUSCLE ACHES  . Sumatriptan   . Tramadol   . Venlafaxine     Medication list has been reviewed and updated.  Review of Systems:   GEN: No acute illnesses, no fevers, chills. GI: No n/v/d, eating normally Pulm: No SOB Interactive and getting along well at home.  Otherwise, ROS is as per the HPI.   Objective:   Physical Examination: BP 106/68  Pulse 73  Temp(Src) 98.7 F (37.1 C) (Oral)  Ht 5\' 6"  (1.676 m)  Wt 229 lb 8 oz (104.101 kg)  BMI 37.06 kg/m2  Ideal Body Weight: Weight in (lb) to have BMI = 25: 154.6   GEN: WDWN, NAD, Non-toxic, A & O x 3 HEENT: Atraumatic, Normocephalic. Neck supple. No  masses, No LAD. Ears and Nose: No external deformity. CV: RRR, No M/G/R. No JVD. No thrill. No extra heart sounds. PULM: CTA B, no wheezes, crackles, rhonchi. No retractions. No resp. distress. No accessory muscle use. EXTR: No c/c/e NEURO Normal gait.  PSYCH: Normally interactive. Conversant. Not depressed or anxious appearing.  Calm demeanor.    Assessment & Plan:   Type II or unspecified type diabetes mellitus with neurological manifestations, not stated as uncontrolled(250.60)  Need for prophylactic vaccination against Streptococcus pneumoniae (pneumococcus) - Plan: Pneumococcal polysaccharide vaccine 23-valent greater than or equal to 2yo subcutaneous/IM, CANCELED: Pneumococcal polysaccharide vaccine 23-valent greater than or equal to 2yo subcutaneous/IM, CANCELED: Pneumococcal polysaccharide vaccine 23-valent greater than or equal to 2yo subcutaneous/IM   >25 minutes spent in face to face time with patient, >50% spent in counselling or coordination of care: Reviewed basic diabetic care, diet, management of diabetes. Her A1c is 7.7. Started check her blood sugar twice a day, and followup in 3 months  Patient Instructions  F/u 3 months   Orders Today:  Orders Placed This Encounter  Procedures  . Pneumococcal polysaccharide vaccine 23-valent greater than or equal to 2yo subcutaneous/IM    New medications, updates to list, dose adjustments: Meds ordered this encounter  Medications  . diclofenac sodium (VOLTAREN) 1 % GEL    Sig: Apply 2 g topically 4 (four) times daily as needed.  Marland Kitchen glucose monitoring kit (FREESTYLE) monitoring kit    Sig: 1 each by Does not apply route as needed for other. DISPENSE CHEAPEST KIT, PER PATIENT INSURANCE, BID TESTING, DX 250.00    Dispense:  1 each    Refill:  0    Signed,  Tjay Velazquez T. Aprel Egelhoff, MD, CAQ Sports Medicine  [facility] [address] [place] [address] Phone: [phone] Fax: [phone]  Updated  Complete Medication List:   Medication List       This list is accurate as of: 05/06/13  1:54 PM.  Always use your most recent med list.               aspirin 81 MG tablet  Take 81 mg by mouth daily.     B COMPLEX 100 PO  Take 1 tablet by mouth daily.     CALCIUM 600 PO  Take by mouth 2 (two) times daily.     cetirizine 10 MG tablet  Commonly known as:  ZYRTEC  Take 10 mg by mouth daily.     citalopram 40 MG tablet  Commonly known as:  CELEXA  TAKE ONE TABLET BY MOUTH ONE TIME DAILY  Coenzyme Q-10 100 MG capsule  Take 100 mg by mouth daily.     diclofenac sodium 1 % Gel  Commonly known as:  VOLTAREN  Apply 2 g topically 4 (four) times daily as needed.     docusate sodium 100 MG capsule  Commonly known as:  COLACE  Take 100-200 mg by mouth at bedtime as needed.     gabapentin 600 MG tablet  Commonly known as:  NEURONTIN  Take 1 tablet (600 mg total) by mouth 3 (three) times daily.     glucose monitoring kit monitoring kit  1 each by Does not apply route as needed for other. DISPENSE CHEAPEST KIT, PER PATIENT INSURANCE, BID TESTING, DX 250.00     levothyroxine 50 MCG tablet  Commonly known as:  SYNTHROID, LEVOTHROID  Take 1 tablet (50 mcg total) by mouth daily.     loratadine 10 MG tablet  Commonly known as:  CLARITIN  Take 10 mg by mouth daily.     MELATONIN + L-THEANINE PO  Take by mouth at bedtime. 3mg /25mg  tablet     metFORMIN 500 MG tablet  Commonly known as:  GLUCOPHAGE  Take 1 tablet (500 mg total) by mouth 2 (two) times daily with a meal.     methocarbamol 500 MG tablet  Commonly known as:  ROBAXIN  3 (three) times daily as needed.     metroNIDAZOLE 0.75 % cream  Commonly known as:  METROCREAM  as needed.     mirtazapine 15 MG tablet  Commonly known as:  REMERON  TAKE 1 TABLET AT BEDTIME     morphine 15 MG 12 hr tablet  Commonly known as:  MS CONTIN  Take 1 tablet (15 mg total) by mouth 3 (three) times daily.     RABEprazole 20 MG  tablet  Commonly known as:  ACIPHEX  Take 1 tablet by mouth daily.     Vitamin D 2000 UNITS tablet  Take 2,000 Units by mouth daily.

## 2013-05-09 ENCOUNTER — Ambulatory Visit: Payer: Medicare Other | Admitting: Family Medicine

## 2013-05-21 ENCOUNTER — Encounter
Payer: Medicare Other | Attending: Physical Medicine and Rehabilitation | Admitting: Physical Medicine and Rehabilitation

## 2013-05-21 ENCOUNTER — Encounter: Payer: Self-pay | Admitting: Physical Medicine and Rehabilitation

## 2013-05-21 VITALS — BP 145/62 | HR 80 | Resp 14 | Ht 66.0 in | Wt 227.8 lb

## 2013-05-21 DIAGNOSIS — M961 Postlaminectomy syndrome, not elsewhere classified: Secondary | ICD-10-CM

## 2013-05-21 DIAGNOSIS — IMO0001 Reserved for inherently not codable concepts without codable children: Secondary | ICD-10-CM | POA: Insufficient documentation

## 2013-05-21 DIAGNOSIS — Z79899 Other long term (current) drug therapy: Secondary | ICD-10-CM | POA: Insufficient documentation

## 2013-05-21 DIAGNOSIS — E119 Type 2 diabetes mellitus without complications: Secondary | ICD-10-CM | POA: Insufficient documentation

## 2013-05-21 MED ORDER — MORPHINE SULFATE ER 15 MG PO TBCR: 15.0000 mg | EXTENDED_RELEASE_TABLET | Freq: Three times a day (TID) | ORAL | Status: DC

## 2013-05-21 NOTE — Progress Notes (Signed)
Subjective:    Patient ID: [name], female    DOB: Sep 24, 1950, 62 y.o.   MRN: [id number]  HPI The patient complains about chronic back pain . The patient denies any radiation. The patient also complains about numbness and tingling in all of her fingers and toes intermittendly.  The problem has been stable, with good and bad days. Patient states, that the Robaxin, I prescribed has helped with the muscle pain in her mid back paraspinal. She also states that the magnesium I recommended at the last visit has given her relief. She states that she is walking and exercising regularly. She reports that she was diagnosed with DM.  Pain Inventory Average Pain 4 Pain Right Now 4 My pain is constant, burning and aching  In the last 24 hours, has pain interfered with the following? General activity 6 Relation with others 7 Enjoyment of life 7 What TIME of day is your pain at its worst? evening, night Sleep (in general) Fair  Pain is worse with: walking, bending, sitting, standing and some activites Pain improves with: rest, heat/ice, therapy/exercise, pacing activities, medication, TENS and injections Relief from Meds: 6  Mobility walk without assistance how many minutes can you walk? 10 ability to climb steps?  yes do you drive?  yes transfers alone Do you have any goals in this area?  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  yes  Neuro/Psych bladder control problems weakness tremor spasms depression  Prior Studies Any changes since last visit?  no  Physicians involved in your care Any changes since last visit?  no   Family History  Problem Relation Age of Onset  . Breast cancer Mother   . Diabetes Mother   . Heart disease Mother   . Diabetes Father   . Heart disease Father   . Stomach cancer Maternal Uncle   . Breast  cancer Paternal Aunt   . Colon cancer Neg Hx    History   Social History  . Marital Status: Married    Spouse Name: N/A    Number of Children: N/A  . Years of Education: N/A   Social History Main Topics  . Smoking status: Never Smoker   . Smokeless tobacco: Never Used  . Alcohol Use: No  . Drug Use: No  . Sexual Activity: None   Other Topics Concern  . None   Social History Narrative  . None   Past Surgical History  Procedure Laterality Date  . Appendectomy    . Cholecystectomy    . Tubal ligation    . Tonsillectomy and adenoidectomy     Past Medical History  Diagnosis Date  . GERD (gastroesophageal reflux disease)   . Unspecified essential hypertension   . Fibromyalgia   . B12 deficiency   . Depression   . Irritable bowel syndrome   . Hyperlipemia   . Migraines   . HYPOTHYROIDISM 05/29/2007  . Type II or unspecified type diabetes mellitus with neurological manifestations, not stated as uncontrolled(250.60) 04/07/2013   BP 145/62  Pulse 80  Resp 14  Ht 5\' 6"  (1.676 m)  Wt 227 lb 12.8 oz (103.329 kg)  BMI 36.79 kg/m2  SpO2 95%      Review of Systems  Cardiovascular: Positive for leg swelling.  Gastrointestinal: Positive for constipation.  Endocrine:  High blood sugar  Genitourinary:       Bladder control problems  Musculoskeletal: Positive for back pain, gait problem and neck pain.  Neurological: Positive for speech difficulty.       Spasms  Psychiatric/Behavioral: Positive for dysphoric mood.  All other systems reviewed and are negative.       Objective:   Physical Exam Constitutional: She is oriented to person, place, and time. She appears well-developed and well-nourished.  HENT:  Head: Normocephalic.  Neck: Neck supple.  Musculoskeletal: She exhibits tenderness in her descendence trapezius bilateral Neurological: She is alert and oriented to person, place, and time.  Skin: Skin is warm and dry.  Psychiatric: She has a normal mood  and affect.  Symmetric normal motor tone is noted throughout. Normal muscle bulk. Muscle testing reveals 5/5 muscle strength of the upper extremity, and 5/5 of the lower extremity. Full range of motion in upper and lower extremities. ROM of spine is mildly restricted. Fine motor movements are normal in both hands.  DTR in the upper and lower extremity are present and symmetric 2+. No clonus is noted.  Patient arises from chair without difficulty. Narrow based gait with normal arm swing bilateral , able to walk on heels and toes . Tandem walk is stable. No pronator drift. Rhomberg negative.        Assessment & Plan:  1. Cervical postlaminectomy syndrome. No evidence of radiculopathy.  2. Fibromyalgia , increased muscle pain, advised patient to keep attention to a good posture , showed her some relaxing exercises for her muscles, prescribed Robaxin 500mg  tid, prn muscle cramps at last visit, which has helped.Her insurance does not want to pay for this medication, but the patient can not tolerate Flexeril, she gets jittery, tachycardic and nauseated and very drowsy from this muscle relaxant.Zanaflex would most likely also make her too drowsy, I advised her to get a form for pre-authorization , for the Robaxin, which she did, she tolerates the Robaxin well, and it is helping her.  The patient reports that the magnesium I recommended at the last visit has helped her with the muscle spasms and also has given her more energy.  Doing well on the current pain medications with no signs of aberrant drug behavior. Will continue using with monthly visits. Advised patient to continue with her walking and exercises program, as pain permits.  Patient was diagnosed with DM, I advised her to eat a healthy diet and exercise, to try to bring her BS back to a normal level. Also recommended that she should talk to her PCP to be referred to a nutritionist.  Follow up with PA in 1 month .  Refilled MS Contin 15mg , 3 times a  day, continue Voltaren gel  Continue Gabapentin 600 mg 3 times per day

## 2013-05-21 NOTE — Patient Instructions (Signed)
Continue with your walking program. 

## 2013-05-27 ENCOUNTER — Other Ambulatory Visit: Payer: Self-pay

## 2013-05-27 MED ORDER — METHOCARBAMOL 500 MG PO TABS: 500.0000 mg | ORAL_TABLET | Freq: Four times a day (QID) | ORAL | Status: DC

## 2013-06-03 ENCOUNTER — Telehealth: Payer: Self-pay | Admitting: *Deleted

## 2013-06-03 NOTE — Telephone Encounter (Signed)
Message left for patient to return my call. She needs to schedule mammogram before the end of the year per [facility].

## 2013-06-04 NOTE — Telephone Encounter (Signed)
Spoke with patient and she will schedule her own mammogram.

## 2013-06-13 ENCOUNTER — Other Ambulatory Visit: Payer: Self-pay

## 2013-06-13 ENCOUNTER — Other Ambulatory Visit: Payer: Self-pay | Admitting: *Deleted

## 2013-06-13 DIAGNOSIS — Z1231 Encounter for screening mammogram for malignant neoplasm of breast: Secondary | ICD-10-CM

## 2013-06-13 MED ORDER — MORPHINE SULFATE ER 15 MG PO TBCR: 15.0000 mg | EXTENDED_RELEASE_TABLET | Freq: Three times a day (TID) | ORAL | Status: DC

## 2013-06-13 NOTE — Telephone Encounter (Signed)
RX printed early for controlled medication for the visit with RN on 06/24/13 (to be signed by MD) 

## 2013-06-18 ENCOUNTER — Other Ambulatory Visit: Payer: Self-pay | Admitting: Family Medicine

## 2013-06-19 ENCOUNTER — Other Ambulatory Visit: Payer: Self-pay | Admitting: Family Medicine

## 2013-06-19 NOTE — Telephone Encounter (Signed)
Received refill request electronically. Last refill 05/04/12 #90/3 refills and last office visit 05/06/13. Is it okay to refill medication?

## 2013-06-24 ENCOUNTER — Encounter: Payer: Medicare Other | Attending: Physical Medicine & Rehabilitation | Admitting: *Deleted

## 2013-06-24 ENCOUNTER — Encounter: Payer: Self-pay | Admitting: *Deleted

## 2013-06-24 DIAGNOSIS — IMO0001 Reserved for inherently not codable concepts without codable children: Secondary | ICD-10-CM

## 2013-06-24 DIAGNOSIS — M961 Postlaminectomy syndrome, not elsewhere classified: Secondary | ICD-10-CM

## 2013-06-24 DIAGNOSIS — IMO0002 Reserved for concepts with insufficient information to code with codable children: Secondary | ICD-10-CM

## 2013-06-24 NOTE — Progress Notes (Signed)
Here for pill count and medication refills. Morphine Sulfate ER 15 mg # 90  Fill date 05/22/13     Today NV # 4  Appropriate.  Refill given. Med list unchanged .  Pian level 4 today .  Follow up with RN for med refill and pill count one month and 2 month with K[facility].

## 2013-06-24 NOTE — Patient Instructions (Signed)
Follow up one month with RN for pill count and med refill and 2 month with MD  

## 2013-06-26 ENCOUNTER — Ambulatory Visit
Admission: RE | Admit: 2013-06-26 | Discharge: 2013-06-26 | Disposition: A | Discharge status: Home / Self Care | Payer: Medicare Other | Source: Ambulatory Visit

## 2013-06-26 DIAGNOSIS — Z1231 Encounter for screening mammogram for malignant neoplasm of breast: Secondary | ICD-10-CM

## 2013-07-18 ENCOUNTER — Other Ambulatory Visit: Payer: Self-pay | Admitting: *Deleted

## 2013-07-18 MED ORDER — MORPHINE SULFATE ER 15 MG PO TBCR: 15.0000 mg | EXTENDED_RELEASE_TABLET | Freq: Three times a day (TID) | ORAL | Status: DC

## 2013-07-18 NOTE — Telephone Encounter (Signed)
RX printed early for controlled medication for the visit with RN on 07/23/13 (to be signed by MD)

## 2013-07-23 ENCOUNTER — Encounter: Payer: Medicare Other | Attending: Physical Medicine & Rehabilitation | Admitting: *Deleted

## 2013-07-23 ENCOUNTER — Encounter: Payer: Self-pay | Admitting: *Deleted

## 2013-07-23 VITALS — BP 127/42 | HR 72 | Resp 14 | Wt 226.2 lb

## 2013-07-23 DIAGNOSIS — Z760 Encounter for issue of repeat prescription: Secondary | ICD-10-CM | POA: Insufficient documentation

## 2013-07-23 DIAGNOSIS — IMO0002 Reserved for concepts with insufficient information to code with codable children: Secondary | ICD-10-CM | POA: Insufficient documentation

## 2013-07-23 DIAGNOSIS — M961 Postlaminectomy syndrome, not elsewhere classified: Secondary | ICD-10-CM | POA: Insufficient documentation

## 2013-07-23 NOTE — Patient Instructions (Signed)
Follow up with Dr Letta Pate at your previously scheduled appt 08/23/13 @ 12:30

## 2013-07-23 NOTE — Progress Notes (Signed)
Here for pill count and medication refills. MS CONTIN 15 mg #90 Fill date 06/24/13    Today NV#8   VSS    Pain level:4  No falls. Low fall Risk.  Has return appt with Dr Letta Pate next month.  She will discuss with him her arm pain she has had x 2 after reaching for something when the pain ran up her arm to above her ear on her left side.Pill counts appropriate and refill given.

## 2013-08-07 ENCOUNTER — Other Ambulatory Visit: Payer: Self-pay | Admitting: Family Medicine

## 2013-08-07 ENCOUNTER — Ambulatory Visit: Payer: Medicare Other | Admitting: Family Medicine

## 2013-08-14 ENCOUNTER — Ambulatory Visit: Payer: Medicare Other | Admitting: Family Medicine

## 2013-08-14 ENCOUNTER — Other Ambulatory Visit: Payer: Self-pay

## 2013-08-14 NOTE — Telephone Encounter (Signed)
Erroneous encounter

## 2013-08-19 ENCOUNTER — Telehealth: Payer: Self-pay

## 2013-08-19 MED ORDER — TIZANIDINE HCL 2 MG PO TABS: 2.0000 mg | ORAL_TABLET | Freq: Three times a day (TID) | ORAL | Status: DC

## 2013-08-19 NOTE — Telephone Encounter (Signed)
Left message for patient to call the office to discuss a change in medication from methocarbamol to tizanidine due to insurance denying the prior authorization.  Tizanidine has been sent to the pharmacy.

## 2013-08-20 ENCOUNTER — Ambulatory Visit: Payer: Medicare Other | Admitting: Physical Medicine & Rehabilitation

## 2013-08-20 ENCOUNTER — Telehealth: Payer: Self-pay

## 2013-08-20 ENCOUNTER — Other Ambulatory Visit: Payer: Self-pay | Admitting: *Deleted

## 2013-08-20 MED ORDER — MORPHINE SULFATE ER 15 MG PO TBCR: 15.0000 mg | EXTENDED_RELEASE_TABLET | Freq: Three times a day (TID) | ORAL | Status: DC

## 2013-08-20 NOTE — Telephone Encounter (Signed)
Patient aware her medication has been changed from methocarbamol to tizanidine.

## 2013-08-20 NOTE — Telephone Encounter (Signed)
Left message for patient to call office regarding her medication change.

## 2013-08-20 NOTE — Telephone Encounter (Signed)
RX printed early for controlled medication for the visit with RN on 08/21/13 (to be signed by MD) 

## 2013-08-21 ENCOUNTER — Encounter: Payer: Self-pay | Admitting: Family Medicine

## 2013-08-21 ENCOUNTER — Encounter: Payer: Medicare Other | Attending: Physical Medicine & Rehabilitation | Admitting: *Deleted

## 2013-08-21 ENCOUNTER — Other Ambulatory Visit: Payer: Self-pay | Admitting: Family Medicine

## 2013-08-21 ENCOUNTER — Ambulatory Visit (INDEPENDENT_AMBULATORY_CARE_PROVIDER_SITE_OTHER): Payer: Medicare Other | Admitting: Family Medicine

## 2013-08-21 ENCOUNTER — Encounter: Payer: Self-pay | Admitting: *Deleted

## 2013-08-21 VITALS — BP 110/50 | HR 73 | Temp 98.0°F | Ht 66.0 in | Wt 227.2 lb

## 2013-08-21 VITALS — BP 127/48 | HR 72 | Resp 14

## 2013-08-21 DIAGNOSIS — E039 Hypothyroidism, unspecified: Secondary | ICD-10-CM

## 2013-08-21 DIAGNOSIS — E538 Deficiency of other specified B group vitamins: Secondary | ICD-10-CM | POA: Insufficient documentation

## 2013-08-21 DIAGNOSIS — L0293 Carbuncle, unspecified: Secondary | ICD-10-CM

## 2013-08-21 DIAGNOSIS — IMO0001 Reserved for inherently not codable concepts without codable children: Secondary | ICD-10-CM | POA: Insufficient documentation

## 2013-08-21 DIAGNOSIS — M961 Postlaminectomy syndrome, not elsewhere classified: Secondary | ICD-10-CM | POA: Insufficient documentation

## 2013-08-21 DIAGNOSIS — R209 Unspecified disturbances of skin sensation: Secondary | ICD-10-CM | POA: Insufficient documentation

## 2013-08-21 DIAGNOSIS — I10 Essential (primary) hypertension: Secondary | ICD-10-CM | POA: Insufficient documentation

## 2013-08-21 DIAGNOSIS — M549 Dorsalgia, unspecified: Secondary | ICD-10-CM | POA: Insufficient documentation

## 2013-08-21 DIAGNOSIS — K219 Gastro-esophageal reflux disease without esophagitis: Secondary | ICD-10-CM | POA: Insufficient documentation

## 2013-08-21 DIAGNOSIS — E1149 Type 2 diabetes mellitus with other diabetic neurological complication: Secondary | ICD-10-CM

## 2013-08-21 DIAGNOSIS — E782 Mixed hyperlipidemia: Secondary | ICD-10-CM

## 2013-08-21 DIAGNOSIS — G8929 Other chronic pain: Secondary | ICD-10-CM | POA: Insufficient documentation

## 2013-08-21 DIAGNOSIS — L0292 Furuncle, unspecified: Secondary | ICD-10-CM

## 2013-08-21 DIAGNOSIS — E785 Hyperlipidemia, unspecified: Secondary | ICD-10-CM | POA: Insufficient documentation

## 2013-08-21 LAB — HEMOGLOBIN A1C: Hgb A1c MFr Bld: 6.2 % (ref 4.6–6.5)

## 2013-08-21 MED ORDER — CEPHALEXIN 500 MG PO CAPS: 1000.0000 mg | ORAL_CAPSULE | Freq: Two times a day (BID) | ORAL | Status: DC

## 2013-08-21 MED ORDER — MUPIROCIN 2 % EX OINT: 1.0000 "application " | TOPICAL_OINTMENT | Freq: Two times a day (BID) | CUTANEOUS | Status: DC

## 2013-08-21 NOTE — Patient Instructions (Signed)
MRSA and SKIN INFECTION DECOLONIZATION PROTOCOL:  1. Mupirocin Ointment 2%: (Bactroban generic) Apply to each nostril twice a day for 10 days  2. Chlorhexidine Gluconate (2 or 4% solution): Daily washes. This most easily done in the shower. Rub all over and leave in for about 3 minutes or so. They wash off and pat dry with a towel.  -- Chlorhexidine can be purchased over the counter. You may need to get the pharmacy staff to help you find it. It is possible that they may have to order it.

## 2013-08-21 NOTE — Progress Notes (Signed)
Date:  08/21/2013   Name:  [name]   DOB:  08/25/1950   MRN:  [id number]  Primary Physician:  Owens Loffler, MD   Chief Complaint: Follow-up   Subjective:   History of Present Illness:  [name] is a 63 y.o. very pleasant female patient who presents with the following:  Infection - ear, eyelid. Has been recurrent, multiple boils. Keflex few months ago helped to clear them. Now one in L ear canal, ? If one is starting on R eyelid.  Wt Readings from Last 3 Encounters:  08/21/13 227 lb 4 oz (103.08 kg)  07/23/13 226 lb 3.2 oz (102.604 kg)  05/21/13 227 lb 12.8 oz (103.329 kg)    Lipids: Not at goal. Declines any chol meds.  Lipids:    Component Value Date/Time   CHOL 180 04/03/2013 1307   TRIG 202.0* 04/03/2013 1307   HDL 43.40 04/03/2013 1307   LDLDIRECT 113.0 04/03/2013 1307   VLDL 40.4* 04/03/2013 1307   CHOLHDL 4 04/03/2013 1307    Lab Results  Component Value Date   ALT 62* 04/03/2013   AST 63* 04/03/2013   ALKPHOS 92 04/03/2013   BILITOT 0.5 04/03/2013    Lab Results  Component Value Date   LDLCALC 116* 04/28/2011    Diabetes Mellitus: Tolerating Medications: yes Compliance with diet: fair Exercise: minimal / intermittent Avg blood sugars at home: not checking Foot problems: none Hypoglycemia: none No nausea, vomitting, blurred vision, polyuria.  Lab Results  Component Value Date   HGBA1C 6.2 08/21/2013   HGBA1C 7.7* 04/05/2013   HGBA1C 6.6* 04/28/2011   Lab Results  Component Value Date   LDLCALC 116* 04/28/2011   CREATININE 0.8 04/03/2013    Wt Readings from Last 3 Encounters:  08/21/13 227 lb 4 oz (103.08 kg)  07/23/13 226 lb 3.2 oz (102.604 kg)  05/21/13 227 lb 12.8 oz (103.329 kg)    Body mass index is 36.7 kg/(m^2).   Mood is relatively stable now  Pain is still intermittent. Sometimes a struggle, but doing ok with pain management.  Past Medical History, Surgical History, Social History, Family History, Problem List, Medications,  and Allergies have been reviewed and updated if relevant.  Review of Systems:  GEN: No acute illnesses - other than skin, no fevers, chills. GI: No n/v/d, eating normally Pulm: No SOB Interactive and getting along well at home.  Otherwise, ROS is as per the HPI.  Objective:   Physical Examination: BP 110/50  Pulse 73  Temp(Src) 98 F (36.7 C) (Oral)  Ht 5' 6"  (1.676 m)  Wt 227 lb 4 oz (103.08 kg)  BMI 36.70 kg/m2  SpO2 93%   GEN: WDWN, NAD, Non-toxic, A & O x 3 HEENT: Atraumatic, Normocephalic. Neck supple. No masses, No LAD. Ears and Nose: No external deformity. L canal is reddish with small boil with ulceration internally. TTP. CV: RRR, No M/G/R. No JVD. No thrill. No extra heart sounds. PULM: CTA B, no wheezes, crackles, rhonchi. No retractions. No resp. distress. No accessory muscle use. EXTR: No c/c/e NEURO Normal gait.  PSYCH: Normally interactive. Conversant. Not depressed or anxious appearing.  Calm demeanor.   Diabetic foot exam: Normal inspection No skin breakdown No calluses  Normal DP pulses Normal sensation to light tough Nails normal    Laboratory and Imaging Data: Results for orders placed in visit on 08/21/13  HEMOGLOBIN A1C      Result Value Ref Range   Hemoglobin A1C 6.2  4.6 -  6.5 %     Assessment & Plan:   Type II or unspecified type diabetes mellitus with neurological manifestations, not stated as uncontrolled(250.60) - Plan: Hemoglobin A1c, stable  FIBROMYALGIA  Postlaminectomy syndrome, cervical region  HYPERLIPIDEMIA: declines treatment.  HYPOTHYROIDISM:  Lab Results  Component Value Date   TSH 3.18 11/07/2012    Has been stable.  Recurrent boils. Acute abx and eradication protocol.   New Prescriptions   CEPHALEXIN (KEFLEX) 500 MG CAPSULE    Take 2 capsules (1,000 mg total) by mouth 2 (two) times daily.   MUPIROCIN OINTMENT (BACTROBAN) 2 %    Place 1 application into the nose 2 (two) times daily. For 1 week   Orders  Placed This Encounter  Procedures  . Hemoglobin A1c   Signed,  Saivon Prowse T. Miliani Deike, MD, [place] [facility] [place] [address] Phone: [phone] Fax: [phone]  Patient Instructions  MRSA and SKIN INFECTION DECOLONIZATION PROTOCOL:  1. Mupirocin Ointment 2%: (Bactroban generic) Apply to each nostril twice a day for 10 days  2. Chlorhexidine Gluconate (2 or 4% solution): Daily washes. This most easily done in the shower. Rub all over and leave in for about 3 minutes or so. They wash off and pat dry with a towel.  -- Chlorhexidine can be purchased over the counter. You may need to get the pharmacy staff to help you find it. It is possible that they may have to order it.  Patient's Medications  New Prescriptions   CEPHALEXIN (KEFLEX) 500 MG CAPSULE    Take 2 capsules (1,000 mg total) by mouth 2 (two) times daily.   MUPIROCIN OINTMENT (BACTROBAN) 2 %    Place 1 application into the nose 2 (two) times daily. For 1 week  Previous Medications   ASPIRIN 81 MG TABLET    Take 81 mg by mouth daily.     B COMPLEX VITAMINS (B COMPLEX 100 PO)    Take 1 tablet by mouth daily.   CALCIUM CARBONATE (CALCIUM 600 PO)    Take by mouth 2 (two) times daily.    CETIRIZINE (ZYRTEC) 10 MG TABLET    Take 10 mg by mouth daily.     CHOLECALCIFEROL (VITAMIN D) 2000 UNITS TABLET    Take 2,000 Units by mouth daily.     COENZYME Q-10 100 MG CAPSULE    Take 100 mg by mouth daily.   DICLOFENAC SODIUM (VOLTAREN) 1 % GEL    Apply 2 g topically 4 (four) times daily as needed.   DOCUSATE SODIUM (COLACE) 100 MG CAPSULE    Take 100-200 mg by mouth at bedtime as needed.    GABAPENTIN (NEURONTIN) 600 MG TABLET    Take 1 tablet (600 mg total) by mouth 3 (three) times daily.   GLUCOSE MONITORING KIT (FREESTYLE) MONITORING KIT    1 each by Does not apply route as needed for other. DISPENSE CHEAPEST KIT, PER PATIENT INSURANCE, BID TESTING, DX 250.00   LEVOTHYROXINE  (SYNTHROID, LEVOTHROID) 50 MCG TABLET    Take 1 tablet (50 mcg total) by mouth daily.   LORATADINE (CLARITIN) 10 MG TABLET    Take 10 mg by mouth daily.   MELATON-THEAN-CHAM-PASSF-LBALM (MELATONIN + L-THEANINE PO)    Take by mouth at bedtime. 26m/25mg tablet    METFORMIN (GLUCOPHAGE) 500 MG TABLET    TAKE 1 TABLET BY MOUTH TWICE A DAY WITH A MEAL   METRONIDAZOLE (METROCREAM) 0.75 % CREAM    as  needed.    MIRTAZAPINE (REMERON) 15 MG TABLET    TAKE ONE TABLET BY MOUTH EVERY NIGHT AT BEDTIME   MORPHINE (MS CONTIN) 15 MG 12 HR TABLET    Take 1 tablet (15 mg total) by mouth 3 (three) times daily.   RABEPRAZOLE (ACIPHEX) 20 MG TABLET    TAKE 1 TABLET BY MOUTH DAILY   TIZANIDINE (ZANAFLEX) 2 MG TABLET    Take 1 tablet (2 mg total) by mouth 3 (three) times daily.  Modified Medications   Modified Medication Previous Medication   CITALOPRAM (CELEXA) 40 MG TABLET citalopram (CELEXA) 40 MG tablet      TAKE 1 TABLET BY MOUTH DAILY    TAKE ONE TABLET BY MOUTH ONE TIME DAILY  Discontinued Medications   No medications on file

## 2013-08-21 NOTE — Patient Instructions (Signed)
Follow up one month with DR Letta Pate or Algis Liming PA

## 2013-08-21 NOTE — Progress Notes (Signed)
Pre visit review using our clinic review tool, if applicable. No additional management support is needed unless otherwise documented below in the visit note. 

## 2013-08-21 NOTE — Progress Notes (Signed)
Here for pill count and medication refills. MS CONTIN 15 mg # 90 Fill date 07/24/13    Today NV#10  Pill count appropriate.  Refill given.  No falls. Low fall risk. Handout given for fall prevention in the home.  Return to clinic in one month for refills.

## 2013-08-23 ENCOUNTER — Ambulatory Visit: Payer: Medicare Other | Admitting: Physical Medicine & Rehabilitation

## 2013-08-23 DIAGNOSIS — E669 Obesity, unspecified: Secondary | ICD-10-CM | POA: Insufficient documentation

## 2013-09-19 ENCOUNTER — Encounter: Payer: Self-pay | Admitting: Physical Medicine and Rehabilitation

## 2013-09-19 ENCOUNTER — Encounter
Payer: Medicare Other | Attending: Physical Medicine and Rehabilitation | Admitting: Physical Medicine and Rehabilitation

## 2013-09-19 VITALS — BP 140/64 | HR 78 | Resp 14 | Ht 67.0 in | Wt 222.0 lb

## 2013-09-19 DIAGNOSIS — M542 Cervicalgia: Secondary | ICD-10-CM | POA: Insufficient documentation

## 2013-09-19 DIAGNOSIS — Z79899 Other long term (current) drug therapy: Secondary | ICD-10-CM

## 2013-09-19 DIAGNOSIS — IMO0001 Reserved for inherently not codable concepts without codable children: Secondary | ICD-10-CM | POA: Insufficient documentation

## 2013-09-19 DIAGNOSIS — G43909 Migraine, unspecified, not intractable, without status migrainosus: Secondary | ICD-10-CM | POA: Insufficient documentation

## 2013-09-19 DIAGNOSIS — K219 Gastro-esophageal reflux disease without esophagitis: Secondary | ICD-10-CM | POA: Insufficient documentation

## 2013-09-19 DIAGNOSIS — E119 Type 2 diabetes mellitus without complications: Secondary | ICD-10-CM | POA: Insufficient documentation

## 2013-09-19 DIAGNOSIS — I10 Essential (primary) hypertension: Secondary | ICD-10-CM | POA: Insufficient documentation

## 2013-09-19 DIAGNOSIS — G8929 Other chronic pain: Secondary | ICD-10-CM | POA: Insufficient documentation

## 2013-09-19 DIAGNOSIS — M961 Postlaminectomy syndrome, not elsewhere classified: Secondary | ICD-10-CM

## 2013-09-19 DIAGNOSIS — M549 Dorsalgia, unspecified: Secondary | ICD-10-CM

## 2013-09-19 MED ORDER — MORPHINE SULFATE ER 15 MG PO TBCR: 15.0000 mg | EXTENDED_RELEASE_TABLET | Freq: Three times a day (TID) | ORAL | Status: DC

## 2013-09-19 MED ORDER — METHOCARBAMOL 500 MG PO TABS: 500.0000 mg | ORAL_TABLET | Freq: Four times a day (QID) | ORAL | Status: DC | PRN

## 2013-09-19 NOTE — Progress Notes (Signed)
Subjective:    Patient ID: [name], female    DOB: 20-Mar-1951, 63 y.o.   MRN: [id number]  HPI [name] is a 63 year old female with chronic neck and back pain. She has radiation to mid trapezius area as well as numbness and tingling bilateral hands occasionally. She reported muscle aches of upper and lower back that are best managed by robaxin. She continues to walk few days a week for about 1/2 a mile and does range of motion exercises daily.  She is working 3-4 hrs/3 times a week. She drives short distances.  Husband is supportive and does most of the driving and helps with housework.    Pain Inventory Average Pain 4 Pain Right Now 5 My pain is constant, burning and aching  In the last 24 hours, has pain interfered with the following? General activity 6 Relation with others 6 Enjoyment of life 7 What TIME of day is your pain at its worst? morning,evening Sleep (in general) Fair  Pain is worse with: walking, bending, sitting, standing and some activites Pain improves with: rest, heat/ice, therapy/exercise, pacing activities, medication, TENS and injections Relief from Meds: 5  Mobility walk without assistance use a walker how many minutes can you walk? 10-15 ability to climb steps?  yes Do you have any goals in this area?  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  yes  Neuro/Psych bladder control problems numbness tremor spasms depression  Prior Studies Any changes since last visit?  no  Physicians involved in your care Any changes since last visit?  no   Family History  Problem Relation Age of Onset  . Breast cancer Mother   . Diabetes Mother   . Heart disease Mother   . Diabetes Father   . Heart disease Father   . Stomach cancer Maternal Uncle   . Breast cancer  Paternal Aunt   . Colon cancer Neg Hx    History   Social History  . Marital Status: Married    Spouse Name: N/A    Number of Children: N/A  . Years of Education: N/A   Social History Main Topics  . Smoking status: Never Smoker   . Smokeless tobacco: Never Used  . Alcohol Use: No  . Drug Use: No  . Sexual Activity: None   Other Topics Concern  . None   Social History Narrative  . None   Past Surgical History  Procedure Laterality Date  . Appendectomy    . Cholecystectomy    . Tubal ligation    . Tonsillectomy and adenoidectomy     Past Medical History  Diagnosis Date  . GERD (gastroesophageal reflux disease)   . Unspecified essential hypertension   . Fibromyalgia   . B12 deficiency   . Depression   . Irritable bowel syndrome   . Hyperlipemia   . Migraines   . HYPOTHYROIDISM 05/29/2007  . Type II or unspecified type diabetes mellitus with neurological manifestations, not stated as uncontrolled 04/07/2013   BP 140/64  Pulse 78  Resp 14  Ht 5\' 7"  (1.702 m)  Wt 222 lb (100.699 kg)  BMI 34.76 kg/m2  SpO2 99%  Opioid Risk Score:   Fall Risk Score: Moderate Fall Risk (6-13 points) (pt educated and given brochure previously)   Review of  Systems  Respiratory: Negative for shortness of breath.   Gastrointestinal: Negative for constipation.  Genitourinary:       Bladder control problems  Musculoskeletal: Positive for back pain and neck pain.  Neurological: Positive for numbness (bilateral hand occasionally). Negative for tremors and headaches.       Spasms  Psychiatric/Behavioral: Negative for sleep disturbance.       Depression   All other systems reviewed and are negative.       Objective:   Physical Exam  Nursing note and vitals reviewed. Constitutional: She is oriented to person, place, and time. She appears well-developed and well-nourished.  HENT:  Head: Normocephalic and atraumatic.  Eyes: Conjunctivae are normal. Pupils are equal, round, and  reactive to light.  Neck:  Decreased ROM L>R  Cardiovascular: Normal rate and regular rhythm.   No murmur heard. Pulmonary/Chest: Effort normal and breath sounds normal. No respiratory distress. She has no wheezes.  Musculoskeletal:  ROM spine mildly limited. Paraspinal tenderness lower back and mid thoracic spine. Strength BUE/BLE 5/5. No sensory deficits. She gets out of chair without difficulty and walks with narrow based gait.  Neurological: She is alert and oriented to person, place, and time.  Skin: Skin is warm and dry.  Psychiatric: She has a normal mood and affect. Her behavior is normal. Judgment and thought content normal.          Assessment & Plan:  1. Cervical postlaminectomy syndrome: with neck and shoulder pain. Discussed gentle ROM on neck as well as good posture. Also discussed outpatient PT to help reinforce exercises and postural education.  Refilled: MS contin # 90 pills --use tid  2. Fibromyalgia: Back tenderness and spasms. She tried Zanaflex for a few days but thisscaused dizziness and she is not using this.  She wants to go back to robaxin and is willing to pay for this out of pocket. Refilled: Robaxin 500 mg # 60 pills/ one refill--use one pill every 6 hours prn.

## 2013-09-19 NOTE — Patient Instructions (Signed)
Pay attention to your posture. Work on gentle range of motion of neck

## 2013-10-03 ENCOUNTER — Other Ambulatory Visit: Payer: Self-pay

## 2013-10-14 ENCOUNTER — Encounter: Payer: Self-pay | Admitting: Physical Medicine & Rehabilitation

## 2013-10-14 ENCOUNTER — Ambulatory Visit (HOSPITAL_BASED_OUTPATIENT_CLINIC_OR_DEPARTMENT_OTHER): Payer: Medicare Other | Admitting: Physical Medicine & Rehabilitation

## 2013-10-14 ENCOUNTER — Encounter: Payer: Medicare Other | Attending: Physical Medicine & Rehabilitation

## 2013-10-14 VITALS — BP 132/42 | HR 63 | Resp 14 | Ht 67.0 in | Wt 220.0 lb

## 2013-10-14 DIAGNOSIS — IMO0001 Reserved for inherently not codable concepts without codable children: Secondary | ICD-10-CM

## 2013-10-14 DIAGNOSIS — Z5181 Encounter for therapeutic drug level monitoring: Secondary | ICD-10-CM

## 2013-10-14 DIAGNOSIS — M961 Postlaminectomy syndrome, not elsewhere classified: Secondary | ICD-10-CM | POA: Insufficient documentation

## 2013-10-14 DIAGNOSIS — Z79899 Other long term (current) drug therapy: Secondary | ICD-10-CM

## 2013-10-14 DIAGNOSIS — IMO0002 Reserved for concepts with insufficient information to code with codable children: Secondary | ICD-10-CM | POA: Insufficient documentation

## 2013-10-14 DIAGNOSIS — Z760 Encounter for issue of repeat prescription: Secondary | ICD-10-CM | POA: Insufficient documentation

## 2013-10-14 DIAGNOSIS — M549 Dorsalgia, unspecified: Secondary | ICD-10-CM

## 2013-10-14 MED ORDER — GABAPENTIN 600 MG PO TABS: 600.0000 mg | ORAL_TABLET | Freq: Three times a day (TID) | ORAL | Status: DC

## 2013-10-14 MED ORDER — MORPHINE SULFATE ER 15 MG PO TBCR: 15.0000 mg | EXTENDED_RELEASE_TABLET | Freq: Three times a day (TID) | ORAL | Status: DC

## 2013-10-14 NOTE — Patient Instructions (Signed)
Walks 1/2 mile every day, discussed working up gradually over the course of 1-2 months to 1 mile per day

## 2013-10-14 NOTE — Progress Notes (Signed)
Subjective:    Patient ID: [name], female    DOB: 20-Dec-1950, 63 y.o.   MRN: [id number]  HPI Tried Zanaflex in place of methocarbamol because of change in insurance coverage. The Zanaflex was less effective and also gave her side effect of dizziness. Continues on morphine with good relief continues on gabapentin  Denies any new medical issues. Walks 1/2 mile every day, discussed working up gradually over the course of 1-2 months to 1 mile per day Pain Inventory Average Pain 4 Pain Right Now 5 My pain is constant, burning and aching  In the last 24 hours, has pain interfered with the following? General activity 6 Relation with others 6 Enjoyment of life 6 What TIME of day is your pain at its worst? morning and night Sleep (in general) Fair  Pain is worse with: walking, bending, sitting, standing and some activites Pain improves with: rest, heat/ice, therapy/exercise, pacing activities, medication, TENS and injections Relief from Meds: 6  Mobility walk without assistance how many minutes can you walk? 10 ability to climb steps?  yes do you drive?  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  no  Physicians involved in your care Any changes since last visit?  no   Family History  Problem Relation Age of Onset  . Breast cancer Mother   . Diabetes Mother   . Heart disease Mother   . Diabetes Father   . Heart disease Father   . Stomach cancer Maternal Uncle   . Breast cancer Paternal Aunt   . Colon cancer Neg Hx    History   Social History  . Marital Status: Married    Spouse Name: N/A    Number of Children: N/A  . Years of Education: N/A   Social History Main Topics  . Smoking status: Never Smoker   . Smokeless tobacco:  Never Used  . Alcohol Use: No  . Drug Use: No  . Sexual Activity: None   Other Topics Concern  . None   Social History Narrative  . None   Past Surgical History  Procedure Laterality Date  . Appendectomy    . Cholecystectomy    . Tubal ligation    . Tonsillectomy and adenoidectomy    . Anterior cervical decomp/discectomy fusion  2009    first procedure in ~ 1993/Dr Joya Salm   Past Medical History  Diagnosis Date  . GERD (gastroesophageal reflux disease)   . Unspecified essential hypertension   . Fibromyalgia   . B12 deficiency   . Depression   . Irritable bowel syndrome   . Hyperlipemia   . Migraines   . HYPOTHYROIDISM 05/29/2007  . Type II or unspecified type diabetes mellitus with neurological manifestations, not stated as uncontrolled 04/07/2013   BP 132/42  Pulse 63  Resp 14  Ht 5\' 7"  (1.702 m)  Wt 220 lb (99.791 kg)  BMI 34.45 kg/m2  SpO2 97%  Opioid Risk Score: 0 Fall Risk Score: Moderate Fall Risk (6-13 points) (educated and handout given at previous visit)  Review of Systems  Cardiovascular: Positive for leg swelling.  Gastrointestinal: Positive for constipation.  Musculoskeletal: Positive for neck pain.  Neurological: Positive for tremors, weakness and numbness.  Psychiatric/Behavioral: Positive for dysphoric mood.  All other systems reviewed and are negative.  Objective:   Physical Exam  Constitutional: She is oriented to person, place, and time.  Musculoskeletal:       Cervical back: She exhibits decreased range of motion. She exhibits no tenderness.       Thoracic back: She exhibits decreased range of motion. She exhibits no tenderness.       Lumbar back: She exhibits decreased range of motion. She exhibits no tenderness.  Neurological: She is alert and oriented to person, place, and time. She has normal strength. No sensory deficit.  Reflex Scores:      Tricep reflexes are 2+ on the right side and 2+ on the left side.      Bicep reflexes  are 2+ on the right side and 2+ on the left side.      Brachioradialis reflexes are 2+ on the right side and 2+ on the left side.      Patellar reflexes are 2+ on the right side and 2+ on the left side.      Achilles reflexes are 2+ on the right side and 2+ on the left side. Sensation is intact to light touch and pinprick          Assessment & Plan:  1. Cervical postlaminectomy syndrome with chronic postoperative pain doing well on current medication regimen. Continueopioid monitoring program. This consists of regular clinic visits, examinations, urine drug screen, pill counts as well as use of [place] controlled substance reporting System.   2. Fibromyalgia syndrome, and discussed gradually increasing walking distance since this should be the best treatment for fibromyalgia symptoms also unable to tolerate Cymbalta, tramadol Lyrica did not help and caused weight gain Does tolerate gabapentin which does help as well for fibromyalgia

## 2013-10-15 ENCOUNTER — Ambulatory Visit (INDEPENDENT_AMBULATORY_CARE_PROVIDER_SITE_OTHER): Payer: Medicare Other | Admitting: Internal Medicine

## 2013-10-15 ENCOUNTER — Encounter: Payer: Self-pay | Admitting: Internal Medicine

## 2013-10-15 VITALS — BP 132/70 | HR 62 | Temp 98.2°F | Ht 65.13 in | Wt 224.0 lb

## 2013-10-15 DIAGNOSIS — Z0000 Encounter for general adult medical examination without abnormal findings: Secondary | ICD-10-CM

## 2013-10-15 DIAGNOSIS — Z23 Encounter for immunization: Secondary | ICD-10-CM

## 2013-10-15 NOTE — Progress Notes (Signed)
HPI:  Pt presents to the clinic today for her medicare wellness exam. She has no concerns today.  Past Medical History  Diagnosis Date  . GERD (gastroesophageal reflux disease)   . Unspecified essential hypertension   . Fibromyalgia   . B12 deficiency   . Depression   . Irritable bowel syndrome   . Hyperlipemia   . Migraines   . HYPOTHYROIDISM 05/29/2007  . Type II or unspecified type diabetes mellitus with neurological manifestations, not stated as uncontrolled 04/07/2013    Current Outpatient Prescriptions  Medication Sig Dispense Refill  . aspirin 81 MG tablet Take 81 mg by mouth daily.        . B Complex Vitamins (B COMPLEX 100 PO) Take 1 tablet by mouth daily.      . Calcium Carbonate (CALCIUM 600 PO) Take by mouth 2 (two) times daily.       . cephALEXin (KEFLEX) 500 MG capsule Take 2 capsules (1,000 mg total) by mouth 2 (two) times daily.  40 capsule  0  . cetirizine (ZYRTEC) 10 MG tablet Take 10 mg by mouth daily.        . Cholecalciferol (VITAMIN D) 2000 UNITS tablet Take 2,000 Units by mouth daily.        . citalopram (CELEXA) 40 MG tablet TAKE 1 TABLET BY MOUTH DAILY  90 tablet  3  . Coenzyme Q-10 100 MG capsule Take 100 mg by mouth daily.      . diclofenac sodium (VOLTAREN) 1 % GEL Apply 2 g topically 4 (four) times daily as needed.      . docusate sodium (COLACE) 100 MG capsule Take 100-200 mg by mouth at bedtime as needed.       . gabapentin (NEURONTIN) 600 MG tablet Take 1 tablet (600 mg total) by mouth 3 (three) times daily.  270 tablet  2  . glucose monitoring kit (FREESTYLE) monitoring kit 1 each by Does not apply route as needed for other. DISPENSE CHEAPEST KIT, PER PATIENT INSURANCE, BID TESTING, DX 250.00  1 each  0  . levothyroxine (SYNTHROID, LEVOTHROID) 50 MCG tablet Take 1 tablet (50 mcg total) by mouth daily.  90 tablet  3  . loratadine (CLARITIN) 10 MG tablet Take 10 mg by mouth daily.      . Melaton-Thean-Cham-PassF-LBalm (MELATONIN + L-THEANINE PO) Take by  mouth at bedtime. 80m/25mg tablet       . metFORMIN (GLUCOPHAGE) 500 MG tablet TAKE 1 TABLET BY MOUTH TWICE A DAY WITH A MEAL  60 tablet  5  . methocarbamol (ROBAXIN) 500 MG tablet Take 1 tablet (500 mg total) by mouth every 6 (six) hours as needed for muscle spasms.  60 tablet  1  . metroNIDAZOLE (METROCREAM) 0.75 % cream as needed.       . mirtazapine (REMERON) 15 MG tablet TAKE ONE TABLET BY MOUTH EVERY NIGHT AT BEDTIME  90 tablet  3  . morphine (MS CONTIN) 15 MG 12 hr tablet Take 1 tablet (15 mg total) by mouth 3 (three) times daily.  90 tablet  0  . mupirocin ointment (BACTROBAN) 2 % Place 1 application into the nose 2 (two) times daily. For 1 week  22 g  0  . RABEprazole (ACIPHEX) 20 MG tablet TAKE 1 TABLET BY MOUTH DAILY  90 tablet  1   No current facility-administered medications for this visit.    Allergies  Allergen Reactions  . Clarithromycin   . Cyanocobalamin Er   . Cyclobenzaprine Hcl  REACTION: THROAT CLOSED,TOUNGE SWELLING  . Doxycycline   . Duloxetine   . Metronidazole   . Naproxen Sodium   . Nsaids     REACTION: due to liver damage  . Oxycodone Hcl   . Penicillins     TOLERATES CEPHALOSPORINS  . Rizatriptan Benzoate   . Rosuvastatin     REACTION: MUSCLE ACHES  . Sumatriptan   . Tramadol   . Venlafaxine     Family History  Problem Relation Age of Onset  . Breast cancer Mother   . Diabetes Mother   . Heart disease Mother   . Diabetes Father   . Heart disease Father   . Stomach cancer Maternal Uncle   . Breast cancer Paternal Aunt   . Colon cancer Neg Hx     History   Social History  . Marital Status: Married    Spouse Name: N/A    Number of Children: N/A  . Years of Education: N/A   Occupational History  . Not on file.   Social History Main Topics  . Smoking status: Never Smoker   . Smokeless tobacco: Never Used  . Alcohol Use: No  . Drug Use: No  . Sexual Activity: Not on file   Other Topics Concern  . Not on file   Social  History Narrative  . No narrative on file    Hospitiliaztions: None   Health Maintenance:    Flu: never  Tetanus: 2005  Pneumovax: 2014  Zostavax: 2014  Mammogram: 05/2013  Pap Smear: hysterectomy  Bone Density: 2011  Colonoscopy: 2012  Eye Doctor: yearly  Dental Exam: biannually    I have personally reviewed and have noted:  1. The patient's medical and social history 2. Their use of alcohol, tobacco or illicit drugs 3. Their current medications and supplements 4. The patient's functional ability including ADL's, fall risks, home safety risks  and hearing or visual impairment. 5. Diet and physical activities 6. Evidence for depression or mood disorder  Subjective:   Review of Systems:   Constitutional: Denies fever, malaise, fatigue, headache or abrupt weight changes.  HEENT: Denies eye pain, eye redness, ear pain, ringing in the ears, wax buildup, runny nose, nasal congestion, bloody nose, or sore throat. Respiratory: Denies difficulty breathing, shortness of breath, cough or sputum production.   Cardiovascular: Denies chest pain, chest tightness, palpitations or swelling in the hands or feet.  Gastrointestinal: Denies abdominal pain, bloating, constipation, diarrhea or blood in the stool.  GU: Denies urgency, frequency, pain with urination, burning sensation, blood in urine, odor or discharge. Musculoskeletal: Denies decrease in range of motion, difficulty with gait, muscle pain or joint pain and swelling.  Skin: Denies redness, rashes, lesions or ulcercations.  Neurological: Denies dizziness, difficulty with memory, difficulty with speech or problems with balance and coordination.   No other specific complaints in a complete review of systems (except as listed in HPI above).  Objective:  PE:   BP 132/70  Pulse 62  Temp(Src) 98.2 F (36.8 C) (Oral)  Ht 5' 5.12" (1.654 m)  Wt 224 lb (101.606 kg)  BMI 37.14 kg/m2  SpO2 97% Wt Readings from Last 3 Encounters:   10/15/13 224 lb (101.606 kg)  10/14/13 220 lb (99.791 kg)  09/19/13 222 lb (100.699 kg)    General: Appears their stated age, well developed, well nourished in NAD. Skin: Warm, dry and intact. No rashes, lesions or ulcerations noted. HEENT: Head: normal shape and size; Eyes: sclera white, no icterus, conjunctiva pink, PERRLA  and EOMs intact; Ears: Tm's gray and intact, normal light reflex; Nose: mucosa pink and moist, septum midline; Throat/Mouth: Teeth present, mucosa pink and moist, no exudate, lesions or ulcerations noted.  Neck: Normal range of motion. Neck supple, trachea midline. No massses, lumps or thyromegaly present.  Cardiovascular: Normal rate and rhythm. S1,S2 noted.  No murmur, rubs or gallops noted. No JVD or BLE edema. No carotid bruits noted. Pulmonary/Chest: Normal effort and positive vesicular breath sounds. No respiratory distress. No wheezes, rales or ronchi noted.  Abdomen: Soft and nontender. Normal bowel sounds, no bruits noted. No distention or masses noted. Liver, spleen and kidneys non palpable. Musculoskeletal: Normal range of motion. No signs of joint swelling. No difficulty with gait.  Neurological: Alert and oriented. Cranial nerves II-XII intact. Coordination normal. +DTRs bilaterally. Psychiatric: Mood and affect normal. Behavior is normal. Judgment and thought content normal.   EKG:  BMET    Component Value Date/Time   NA 141 04/03/2013 1307   K 4.1 04/03/2013 1307   CL 101 04/03/2013 1307   CO2 29 04/03/2013 1307   GLUCOSE 168* 04/03/2013 1307   BUN 14 04/03/2013 1307   CREATININE 0.8 04/03/2013 1307   CALCIUM 9.8 04/03/2013 1307   GFRNONAA 79.38 02/02/2010 1026   GFRAA  Value: >60        The eGFR has been calculated using the MDRD equation. This calculation has not been validated in all clinical situations. eGFR's persistently <60 mL/min signify possible Chronic Kidney Disease. 06/27/2009 2333    Lipid Panel     Component Value Date/Time   CHOL 180  04/03/2013 1307   TRIG 202.0* 04/03/2013 1307   HDL 43.40 04/03/2013 1307   CHOLHDL 4 04/03/2013 1307   VLDL 40.4* 04/03/2013 1307   LDLCALC 116* 04/28/2011 1031    CBC    Component Value Date/Time   WBC 7.2 04/03/2013 1307   RBC 4.20 04/03/2013 1307   HGB 12.7 04/03/2013 1307   HCT 37.4 04/03/2013 1307   PLT 274.0 04/03/2013 1307   MCV 89.1 04/03/2013 1307   MCHC 33.9 04/03/2013 1307   RDW 12.6 04/03/2013 1307   LYMPHSABS 1.9 04/03/2013 1307   MONOABS 0.3 04/03/2013 1307   EOSABS 0.1 04/03/2013 1307   BASOSABS 0.0 04/03/2013 1307    Hgb A1C Lab Results  Component Value Date   HGBA1C 6.2 08/21/2013      Assessment and Plan:   Medicare Annual Wellness Visit:  Diet:  DM if diabetic Physical activity: Sedentary, walks 1 day per week Depression/mood screen: Negative Hearing: Intact to whispered voice Visual acuity: Grossly normal, performs annual eye exam  ADLs: Capable Fall risk: None Home safety: Good Cognitive evaluation: Intact to orientation, naming, recall and repetition EOL planning: Adv directives, DNR (in chart)/ I agree  Preventative Medicine:  Will give Td booster today Will refer for bone density exam  Next appointment:  4-6 month followup with Dr. Lorelei Pont

## 2013-10-15 NOTE — Progress Notes (Signed)
Pre visit review using our clinic review tool, if applicable. No additional management support is needed unless otherwise documented below in the visit note. 

## 2013-10-15 NOTE — Patient Instructions (Addendum)

## 2013-10-16 NOTE — Addendum Note (Signed)
Addended by: Lurlean Nanny on: 10/16/2013 05:07 PM   Modules accepted: Orders

## 2013-10-21 ENCOUNTER — Other Ambulatory Visit: Payer: Self-pay | Admitting: Physical Medicine & Rehabilitation

## 2013-10-21 NOTE — Progress Notes (Signed)
Urine drug screen from 10/14/2013 was consistent.

## 2013-10-30 ENCOUNTER — Telehealth: Payer: Self-pay

## 2013-10-30 MED ORDER — METHOCARBAMOL 500 MG PO TABS: 500.0000 mg | ORAL_TABLET | Freq: Four times a day (QID) | ORAL | Status: DC | PRN

## 2013-10-30 NOTE — Telephone Encounter (Signed)
Nazlini called to get methocarbamol refill.  Rx escribed to pharmacy.

## 2013-11-13 ENCOUNTER — Encounter: Payer: Medicare Other | Attending: Physical Medicine & Rehabilitation | Admitting: Registered Nurse

## 2013-11-13 ENCOUNTER — Encounter: Payer: Self-pay | Admitting: Registered Nurse

## 2013-11-13 VITALS — BP 134/63 | HR 70 | Resp 14 | Wt 223.0 lb

## 2013-11-13 DIAGNOSIS — I10 Essential (primary) hypertension: Secondary | ICD-10-CM | POA: Insufficient documentation

## 2013-11-13 DIAGNOSIS — Z79899 Other long term (current) drug therapy: Secondary | ICD-10-CM

## 2013-11-13 DIAGNOSIS — IMO0002 Reserved for concepts with insufficient information to code with codable children: Secondary | ICD-10-CM

## 2013-11-13 DIAGNOSIS — E039 Hypothyroidism, unspecified: Secondary | ICD-10-CM | POA: Insufficient documentation

## 2013-11-13 DIAGNOSIS — E785 Hyperlipidemia, unspecified: Secondary | ICD-10-CM | POA: Insufficient documentation

## 2013-11-13 DIAGNOSIS — Z5181 Encounter for therapeutic drug level monitoring: Secondary | ICD-10-CM

## 2013-11-13 DIAGNOSIS — IMO0001 Reserved for inherently not codable concepts without codable children: Secondary | ICD-10-CM

## 2013-11-13 DIAGNOSIS — K219 Gastro-esophageal reflux disease without esophagitis: Secondary | ICD-10-CM | POA: Insufficient documentation

## 2013-11-13 DIAGNOSIS — M549 Dorsalgia, unspecified: Secondary | ICD-10-CM | POA: Insufficient documentation

## 2013-11-13 DIAGNOSIS — G8929 Other chronic pain: Secondary | ICD-10-CM | POA: Insufficient documentation

## 2013-11-13 DIAGNOSIS — M961 Postlaminectomy syndrome, not elsewhere classified: Secondary | ICD-10-CM | POA: Insufficient documentation

## 2013-11-13 DIAGNOSIS — E1149 Type 2 diabetes mellitus with other diabetic neurological complication: Secondary | ICD-10-CM | POA: Insufficient documentation

## 2013-11-13 DIAGNOSIS — E538 Deficiency of other specified B group vitamins: Secondary | ICD-10-CM | POA: Insufficient documentation

## 2013-11-13 DIAGNOSIS — R209 Unspecified disturbances of skin sensation: Secondary | ICD-10-CM | POA: Insufficient documentation

## 2013-11-13 MED ORDER — MORPHINE SULFATE ER 15 MG PO TBCR: 15.0000 mg | EXTENDED_RELEASE_TABLET | Freq: Three times a day (TID) | ORAL | Status: DC

## 2013-11-13 NOTE — Progress Notes (Signed)
Subjective:    Patient ID: [name], female    DOB: [birth year], 63 y.o.   MRN: [id number]  HPI: [name] is a 62 year old female who returns for follow up for chronic pain and medication refill.She says her pain is located in her neck, middle back, and left shoulder.. She rates her pain 5. Her current exercise regime is walking, she's walking 1/4- 1/2 mile daily. She also does stretching exercises 2-3 times a week.   Pain Inventory Average Pain 6 Pain Right Now 5 My pain is constant, burning and aching  In the last 24 hours, has pain interfered with the following? General activity 7 Relation with others 7 Enjoyment of life 6 What TIME of day is your pain at its worst? evening and night Sleep (in general) Fair  Pain is worse with: walking, bending, sitting, standing and some activites Pain improves with: rest, heat/ice, therapy/exercise, pacing activities, medication, TENS and injections Relief from Meds: 6  Mobility walk without assistance how many minutes can you walk? 10-15 ability to climb steps?  yes do you drive?  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  no  Physicians involved in your care Any changes since last visit?  no   Family History  Problem Relation Age of Onset  . Breast cancer Mother   . Diabetes Mother   . Heart disease Mother   . Diabetes Father   . Heart disease Father   . Stomach cancer Maternal Uncle   . Breast cancer Paternal Aunt   . Colon cancer Neg Hx    History   Social History  . Marital Status: Married    Spouse Name: N/A    Number of Children: N/A  . Years of Education: N/A   Social History Main Topics  . Smoking status: Never Smoker   .  Smokeless tobacco: Never Used  . Alcohol Use: No  . Drug Use: No  . Sexual Activity: None   Other Topics Concern  . None   Social History Narrative  . None   Past Surgical History  Procedure Laterality Date  . Appendectomy    . Cholecystectomy    . Tubal ligation    . Tonsillectomy and adenoidectomy    . Anterior cervical decomp/discectomy fusion  2009    first procedure in ~ 1993/Dr Joya Salm   Past Medical History  Diagnosis Date  . GERD (gastroesophageal reflux disease)   . Unspecified essential hypertension   . Fibromyalgia   . B12 deficiency   . Depression   . Irritable bowel syndrome   . Hyperlipemia   . Migraines   . HYPOTHYROIDISM 05/29/2007  . Type II or unspecified type diabetes mellitus with neurological manifestations, not stated as uncontrolled 04/07/2013   BP 134/63  Pulse 70  Resp 14  Wt 223 lb (101.152 kg)  SpO2 98%  Opioid Risk Score:   Fall Risk Score: Moderate Fall Risk (6-13 points) (educated and handout given at previous visit for fall prevention in the home) Review of Systems  Genitourinary:       Bladder control problems  Musculoskeletal:       Spasms  Neurological: Positive for tremors, weakness and numbness.       Tingling  Psychiatric/Behavioral: Positive for dysphoric mood.  All other systems reviewed and are negative.      Objective:   Physical Exam  Nursing note and vitals reviewed. Constitutional: She is oriented to person, place, and time. She appears well-developed and well-nourished.  HENT:  Head: Normocephalic and atraumatic.  Neck: Normal range of motion. Neck supple.  Cardiovascular: Normal rate and regular rhythm.   Pulmonary/Chest: Effort normal and breath sounds normal.  Musculoskeletal:  Normal Muscle Bulk: Muscle Testing Reveals: Upper Extremities: Full ROM and Muscle Strength 5/5 Thoracic Paraspinal Tenderness: T-3- T-5 Spine of Scapula Tenderness noted. Lower Extremities: Left leg Full ROM and Muscle Strength  5/5 Right leg Flexion Produces pain to Lumbar mainly left side. Arises from chair with ease Narrow based gait  Neurological: She is alert and oriented to person, place, and time.  Skin: Skin is warm and dry.  Psychiatric: She has a normal mood and affect.          Assessment & Plan:  1. Cervical postlaminectomy syndrome: with neck and shoulder pain. Continue exercise routine and using heat therapy. Refilled: MS contin 15 mg one tablet three times a day  # 90 pills. 2. Fibromyalgia: Back tenderness and spasms. Robaxin is effective and she is paying for this since insurance won't cover. Continue with activity and exercise regime.  20 minutes of face to face patient care time was spent during this visit. All questions were encouraged and answered.  F/U in 1 month

## 2013-12-11 ENCOUNTER — Other Ambulatory Visit: Payer: Self-pay | Admitting: Family Medicine

## 2013-12-16 ENCOUNTER — Encounter: Payer: Medicare Other | Attending: Physical Medicine & Rehabilitation | Admitting: Registered Nurse

## 2013-12-16 ENCOUNTER — Encounter: Payer: Self-pay | Admitting: Registered Nurse

## 2013-12-16 VITALS — BP 127/47 | HR 70 | Resp 14 | Ht 65.0 in | Wt 225.0 lb

## 2013-12-16 DIAGNOSIS — IMO0002 Reserved for concepts with insufficient information to code with codable children: Secondary | ICD-10-CM

## 2013-12-16 DIAGNOSIS — Z79899 Other long term (current) drug therapy: Secondary | ICD-10-CM

## 2013-12-16 DIAGNOSIS — R209 Unspecified disturbances of skin sensation: Secondary | ICD-10-CM | POA: Insufficient documentation

## 2013-12-16 DIAGNOSIS — K219 Gastro-esophageal reflux disease without esophagitis: Secondary | ICD-10-CM | POA: Insufficient documentation

## 2013-12-16 DIAGNOSIS — IMO0001 Reserved for inherently not codable concepts without codable children: Secondary | ICD-10-CM

## 2013-12-16 DIAGNOSIS — I10 Essential (primary) hypertension: Secondary | ICD-10-CM | POA: Insufficient documentation

## 2013-12-16 DIAGNOSIS — E538 Deficiency of other specified B group vitamins: Secondary | ICD-10-CM | POA: Insufficient documentation

## 2013-12-16 DIAGNOSIS — E1149 Type 2 diabetes mellitus with other diabetic neurological complication: Secondary | ICD-10-CM | POA: Insufficient documentation

## 2013-12-16 DIAGNOSIS — G8929 Other chronic pain: Secondary | ICD-10-CM | POA: Insufficient documentation

## 2013-12-16 DIAGNOSIS — E039 Hypothyroidism, unspecified: Secondary | ICD-10-CM | POA: Insufficient documentation

## 2013-12-16 DIAGNOSIS — E785 Hyperlipidemia, unspecified: Secondary | ICD-10-CM | POA: Insufficient documentation

## 2013-12-16 DIAGNOSIS — M549 Dorsalgia, unspecified: Secondary | ICD-10-CM | POA: Insufficient documentation

## 2013-12-16 DIAGNOSIS — Z5181 Encounter for therapeutic drug level monitoring: Secondary | ICD-10-CM

## 2013-12-16 DIAGNOSIS — M961 Postlaminectomy syndrome, not elsewhere classified: Secondary | ICD-10-CM

## 2013-12-16 MED ORDER — MORPHINE SULFATE ER 15 MG PO TBCR: 15.0000 mg | EXTENDED_RELEASE_TABLET | Freq: Three times a day (TID) | ORAL | Status: DC

## 2013-12-16 NOTE — Progress Notes (Signed)
Subjective:    Patient ID: [name], female    DOB: Mar 06, 1951, 63 y.o.   MRN: [id number]  HPI:[name] is a 63 year old female who returns for follow up for chronic pain and medication refill. She says her pain is located in her right shoulder and mid-back. She rates her pain 6. Her current exercise regime is performing stretching exercises and walking in her home. With the high temperature she hasn't been able to go walking lately.  Her Mother-in-law passed two weeks ago, she was in her 17's. She is doing okay with the loss.   Pain Inventory Average Pain 5 Pain Right Now 6 My pain is constant, burning, tingling and aching  In the last 24 hours, has pain interfered with the following? General activity 6 Relation with others 6 Enjoyment of life 6 What TIME of day is your pain at its worst? morning, night Sleep (in general) Fair  Pain is worse with: walking, bending, sitting, standing and some activites Pain improves with: rest, heat/ice, therapy/exercise, pacing activities, medication and injections Relief from Meds: 7  Mobility walk without assistance how many minutes can you walk? 10-15 ability to climb steps?  yes do you drive?  yes transfers alone Do you have any goals in this area?  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  yes  Neuro/Psych bladder control problems weakness depression  Prior Studies Any changes since last visit?  no  Physicians involved in your care Any changes since last visit?  no   Family History  Problem Relation Age of Onset  . Breast cancer Mother   . Diabetes Mother   . Heart disease Mother   . Diabetes Father   . Heart disease Father   . Stomach cancer Maternal Uncle   . Breast cancer Paternal Aunt   . Colon cancer Neg Hx    History     Social History  . Marital Status: Married    Spouse Name: N/A    Number of Children: N/A  . Years of Education: N/A   Social History Main Topics  . Smoking status: Never Smoker   . Smokeless tobacco: Never Used  . Alcohol Use: No  . Drug Use: No  . Sexual Activity: None   Other Topics Concern  . None   Social History Narrative  . None   Past Surgical History  Procedure Laterality Date  . Appendectomy    . Cholecystectomy    . Tubal ligation    . Tonsillectomy and adenoidectomy    . Anterior cervical decomp/discectomy fusion  2009    first procedure in ~ 1993/Dr Joya Salm   Past Medical History  Diagnosis Date  . GERD (gastroesophageal reflux disease)   . Unspecified essential hypertension   . Fibromyalgia   . B12 deficiency   . Depression   . Irritable bowel syndrome   . Hyperlipemia   . Migraines   . HYPOTHYROIDISM 05/29/2007  . Type II or unspecified type diabetes mellitus with neurological manifestations, not stated as uncontrolled 04/07/2013   BP 127/47  Pulse 70  Resp 14  Ht 5\' 5"  (1.651 m)  Wt 225 lb (102.059 kg)  BMI 37.44 kg/m2  SpO2 95%  Opioid Risk Score:   Fall Risk Score: High Fall Risk (>13 points) (pt educated on  fall risk, brochure given to pt previously)   Review of Systems  Cardiovascular: Positive for leg swelling.  Gastrointestinal: Positive for constipation.  Endocrine:       High blood sugar   Genitourinary:       Bladder control problems  Musculoskeletal: Positive for back pain and neck pain.  Neurological: Positive for tremors and weakness.  Psychiatric/Behavioral:       Depression  All other systems reviewed and are negative.      Objective:   Physical Exam  Nursing note and vitals reviewed. Constitutional: She is oriented to person, place, and time. She appears well-developed and well-nourished.  HENT:  Head: Normocephalic and atraumatic.  Neck: Normal range of motion. Neck supple.  Cardiovascular: Normal rate and  regular rhythm.   Pulmonary/Chest: Effort normal and breath sounds normal.  Musculoskeletal:  Normal Muscle Bulk and Muscle Testing Reveals: Upper Extremities: Full ROM and Muscle Strength 5/5 Thoracic Paraspinal Tenderness: T-7- T-11 Lower Extremities: Full ROM and Muscle Strength 5/5 Arises from chair with ease Narrow Based Gait  Neurological: She is alert and oriented to person, place, and time.  Skin: Skin is warm and dry.  Psychiatric: She has a normal mood and affect.          Assessment & Plan:  1. Cervical postlaminectomy syndrome: with neck and shoulder pain. Continue exercise routine and using heat therapy.  Refilled: MS contin 15 mg one tablet three times a day # 90 pills.  2. Fibromyalgia: Back tenderness and spasms. Continue with Robaxin.  Continue with activity and exercise regime.   15 minutes of face to face patient care time was spent during this visit. All questions were encouraged and answered.   F/U in 1 month

## 2013-12-23 LAB — HM DIABETES EYE EXAM

## 2013-12-25 ENCOUNTER — Encounter: Payer: Self-pay | Admitting: Family Medicine

## 2014-01-13 ENCOUNTER — Other Ambulatory Visit (INDEPENDENT_AMBULATORY_CARE_PROVIDER_SITE_OTHER): Payer: Medicare Other

## 2014-01-13 DIAGNOSIS — E119 Type 2 diabetes mellitus without complications: Secondary | ICD-10-CM

## 2014-01-13 DIAGNOSIS — E039 Hypothyroidism, unspecified: Secondary | ICD-10-CM

## 2014-01-13 DIAGNOSIS — E785 Hyperlipidemia, unspecified: Secondary | ICD-10-CM

## 2014-01-13 DIAGNOSIS — E559 Vitamin D deficiency, unspecified: Secondary | ICD-10-CM

## 2014-01-13 DIAGNOSIS — Z79899 Other long term (current) drug therapy: Secondary | ICD-10-CM

## 2014-01-13 LAB — CBC WITH DIFFERENTIAL/PLATELET
Basophils Absolute: 0 10*3/uL (ref 0.0–0.1)
Basophils Relative: 0.3 % (ref 0.0–3.0)
Eosinophils Absolute: 0.2 10*3/uL (ref 0.0–0.7)
Eosinophils Relative: 2.3 % (ref 0.0–5.0)
HCT: 36.5 % (ref 36.0–46.0)
Hemoglobin: 12.3 g/dL (ref 12.0–15.0)
Lymphocytes Relative: 27.9 % (ref 12.0–46.0)
Lymphs Abs: 2.3 10*3/uL (ref 0.7–4.0)
MCHC: 33.5 g/dL (ref 30.0–36.0)
MCV: 90.5 fl (ref 78.0–100.0)
Monocytes Absolute: 0.4 10*3/uL (ref 0.1–1.0)
Monocytes Relative: 5.4 % (ref 3.0–12.0)
Neutro Abs: 5.3 10*3/uL (ref 1.4–7.7)
Neutrophils Relative %: 64.1 % (ref 43.0–77.0)
Platelets: 257 10*3/uL (ref 150.0–400.0)
RBC: 4.03 Mil/uL (ref 3.87–5.11)
RDW: 13.1 % (ref 11.5–15.5)
WBC: 8.3 10*3/uL (ref 4.0–10.5)

## 2014-01-13 LAB — LIPID PANEL
Cholesterol: 175 mg/dL (ref 0–200)
HDL: 42.8 mg/dL (ref >39.00)
LDL Cholesterol: 90 mg/dL (ref 0–99)
NonHDL: 132.2
Total CHOL/HDL Ratio: 4
Triglycerides: 213 mg/dL — ABNORMAL HIGH (ref 0.0–149.0)
VLDL: 42.6 mg/dL — ABNORMAL HIGH (ref 0.0–40.0)

## 2014-01-13 LAB — VITAMIN D 25 HYDROXY (VIT D DEFICIENCY, FRACTURES): VITD: 39.23 ng/mL

## 2014-01-13 LAB — BASIC METABOLIC PANEL
BUN: 21 mg/dL (ref 6–23)
CO2: 29 mEq/L (ref 19–32)
Calcium: 9.2 mg/dL (ref 8.4–10.5)
Chloride: 101 mEq/L (ref 96–112)
Creatinine, Ser: 0.8 mg/dL (ref 0.4–1.2)
GFR: 78.33 mL/min (ref >60.00)
Glucose, Bld: 91 mg/dL (ref 70–99)
Potassium: 4.2 mEq/L (ref 3.5–5.1)
Sodium: 139 mEq/L (ref 135–145)

## 2014-01-13 LAB — HEMOGLOBIN A1C: Hgb A1c MFr Bld: 6.2 % (ref 4.6–6.5)

## 2014-01-13 LAB — HEPATIC FUNCTION PANEL
ALT: 26 U/L (ref 0–35)
AST: 27 U/L (ref 0–37)
Albumin: 3.7 g/dL (ref 3.5–5.2)
Alkaline Phosphatase: 88 U/L (ref 39–117)
Bilirubin, Direct: 0.1 mg/dL (ref 0.0–0.3)
Total Bilirubin: 0.6 mg/dL (ref 0.2–1.2)
Total Protein: 7.1 g/dL (ref 6.0–8.3)

## 2014-01-13 LAB — MICROALBUMIN / CREATININE URINE RATIO
Creatinine,U: 72.2 mg/dL
Microalb Creat Ratio: 0.3 mg/g (ref 0.0–30.0)
Microalb, Ur: 0.2 mg/dL (ref 0.0–1.9)

## 2014-01-13 LAB — TSH: TSH: 3.77 u[IU]/mL (ref 0.35–4.50)

## 2014-01-15 ENCOUNTER — Telehealth: Payer: Self-pay

## 2014-01-15 ENCOUNTER — Encounter: Payer: Medicare Other | Admitting: Registered Nurse

## 2014-01-15 MED ORDER — MORPHINE SULFATE ER 15 MG PO TBCR: 15.0000 mg | EXTENDED_RELEASE_TABLET | Freq: Three times a day (TID) | ORAL | Status: DC

## 2014-01-15 NOTE — Telephone Encounter (Signed)
Patient aware morphine rx ready for pick up.  She has a follow up appointment scheduled.

## 2014-01-15 NOTE — Telephone Encounter (Signed)
please re-fill. Thanks

## 2014-01-15 NOTE — Telephone Encounter (Signed)
Patient is throwing up and is not able to keep her appointment.  She will be out of her medication before her next appointment.  She will need a refill on her morphine.  Please advise.

## 2014-01-21 ENCOUNTER — Encounter: Payer: Self-pay | Admitting: Registered Nurse

## 2014-01-21 ENCOUNTER — Encounter: Payer: Medicare Other | Attending: Physical Medicine & Rehabilitation | Admitting: Registered Nurse

## 2014-01-21 VITALS — BP 138/63 | HR 71 | Resp 14 | Wt 221.2 lb

## 2014-01-21 DIAGNOSIS — IMO0001 Reserved for inherently not codable concepts without codable children: Secondary | ICD-10-CM

## 2014-01-21 DIAGNOSIS — E785 Hyperlipidemia, unspecified: Secondary | ICD-10-CM | POA: Diagnosis not present

## 2014-01-21 DIAGNOSIS — E039 Hypothyroidism, unspecified: Secondary | ICD-10-CM | POA: Diagnosis not present

## 2014-01-21 DIAGNOSIS — G8929 Other chronic pain: Secondary | ICD-10-CM | POA: Diagnosis not present

## 2014-01-21 DIAGNOSIS — R209 Unspecified disturbances of skin sensation: Secondary | ICD-10-CM | POA: Diagnosis not present

## 2014-01-21 DIAGNOSIS — M549 Dorsalgia, unspecified: Secondary | ICD-10-CM | POA: Insufficient documentation

## 2014-01-21 DIAGNOSIS — I10 Essential (primary) hypertension: Secondary | ICD-10-CM | POA: Diagnosis not present

## 2014-01-21 DIAGNOSIS — E538 Deficiency of other specified B group vitamins: Secondary | ICD-10-CM | POA: Diagnosis not present

## 2014-01-21 DIAGNOSIS — M961 Postlaminectomy syndrome, not elsewhere classified: Secondary | ICD-10-CM | POA: Diagnosis not present

## 2014-01-21 DIAGNOSIS — K219 Gastro-esophageal reflux disease without esophagitis: Secondary | ICD-10-CM | POA: Insufficient documentation

## 2014-01-21 DIAGNOSIS — E1149 Type 2 diabetes mellitus with other diabetic neurological complication: Secondary | ICD-10-CM | POA: Insufficient documentation

## 2014-01-21 DIAGNOSIS — Z5181 Encounter for therapeutic drug level monitoring: Secondary | ICD-10-CM

## 2014-01-21 DIAGNOSIS — IMO0002 Reserved for concepts with insufficient information to code with codable children: Secondary | ICD-10-CM

## 2014-01-21 DIAGNOSIS — Z79899 Other long term (current) drug therapy: Secondary | ICD-10-CM

## 2014-01-21 NOTE — Progress Notes (Signed)
Subjective:    Patient ID: [name], female    DOB: 1950/10/26, 63 y.o.   MRN: [id number]  HPI: HPI:[name] is a 63 year old female who returns for follow up for chronic pain and medication refill. She says her pain is located in her mid-back. She rates her pain 6. Her current exercise regime is performing stretching exercises and walking. She says a week ago she wasn't feeling well vomiting and diarrhea. The vomiting and diarrhea has subsided and she is starting to feel better.  Pain Inventory Average Pain 6 Pain Right Now 6 My pain is constant, burning and aching  In the last 24 hours, has pain interfered with the following? General activity 7 Relation with others 7 Enjoyment of life 7 What TIME of day is your pain at its worst? daytime and evening Sleep (in general) Fair  Pain is worse with: walking, bending, sitting, standing and some activites Pain improves with: rest, heat/ice, therapy/exercise, pacing activities, medication, TENS and injections Relief from Meds: 6  Mobility walk without assistance how many minutes can you walk? 10-15 ability to climb steps?  yes do you drive?  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  no  Physicians involved in your care Any changes since last visit?  no   Family History  Problem Relation Age of Onset  . Breast cancer Mother   . Diabetes Mother   . Heart disease Mother   . Diabetes Father   . Heart disease Father   . Stomach cancer Maternal Uncle   . Breast cancer Paternal Aunt   . Colon cancer Neg Hx    History   Social History  . Marital Status: Married    Spouse Name: N/A    Number of Children: N/A  . Years of Education:  N/A   Social History Main Topics  . Smoking status: Never Smoker   . Smokeless tobacco: Never Used  . Alcohol Use: No  . Drug Use: No  . Sexual Activity: None   Other Topics Concern  . None   Social History Narrative  . None   Past Surgical History  Procedure Laterality Date  . Appendectomy    . Cholecystectomy    . Tubal ligation    . Tonsillectomy and adenoidectomy    . Anterior cervical decomp/discectomy fusion  2009    first procedure in ~ 1993/Dr Joya Salm   Past Medical History  Diagnosis Date  . GERD (gastroesophageal reflux disease)   . Unspecified essential hypertension   . Fibromyalgia   . B12 deficiency   . Depression   . Irritable bowel syndrome   . Hyperlipemia   . Migraines   . HYPOTHYROIDISM 05/29/2007  . Type II or unspecified type diabetes mellitus with neurological manifestations, not stated as uncontrolled 04/07/2013   BP 138/63  Pulse 71  Resp 14  Wt 221 lb 3.2 oz (100.336 kg)  SpO2 99%  Opioid Risk Score:   Fall Risk Score: Moderate Fall Risk (6-13 points) (previously educated and given handout )  Review of Systems  Gastrointestinal: Positive for nausea, vomiting and diarrhea.  Genitourinary:       Bladder control problems  Musculoskeletal:       Spasms  Neurological: Positive  for tremors, weakness and numbness.       Tingling  Psychiatric/Behavioral: Positive for dysphoric mood.  All other systems reviewed and are negative.      Objective:   Physical Exam  Nursing note and vitals reviewed. Constitutional: She is oriented to person, place, and time. She appears well-developed and well-nourished.  HENT:  Head: Normocephalic and atraumatic.  Neck: Normal range of motion. Neck supple.  Cardiovascular: Normal rate, regular rhythm and normal heart sounds.   Pulmonary/Chest: Effort normal and breath sounds normal.  Musculoskeletal:  Normal Muscle Bulk and Muscle testing Reveals: Upper Extremities: Full ROM and Muscle Strength  5/5 Spinal Forward Flexion 80 Degrees and Extension 20 Degrees Thoracic Paraspinal Tenderness: T-7- T-11 Lower Extremities: Full ROM and Muscle Strength 5/5 Arises from Chair with Ease Narrow Based Gait  Neurological: She is alert and oriented to person, place, and time.  Skin: Skin is warm and dry.  Psychiatric: She has a normal mood and affect.          Assessment & Plan:  1. Cervical postlaminectomy syndrome: with neck and shoulder pain. Continue exercise routine and using heat therapy. Continue MS Contin 15 mg one tablet three times a day. [facility] wrote a letter stating they owe her 39 tablets of MS Contin 15 mg. No script was given.  2. Fibromyalgia: Back tenderness and spasms. Continue with Robaxin. Continue with activity and exercise regime.   15 minutes of face to face patient care time was spent during this visit. All questions were encouraged and answered.   F/U in 1 month

## 2014-02-04 ENCOUNTER — Other Ambulatory Visit: Payer: Self-pay | Admitting: Family Medicine

## 2014-02-10 ENCOUNTER — Other Ambulatory Visit: Payer: Self-pay

## 2014-02-10 MED ORDER — METHOCARBAMOL 500 MG PO TABS: 500.0000 mg | ORAL_TABLET | Freq: Three times a day (TID) | ORAL | Status: DC | PRN

## 2014-02-12 ENCOUNTER — Encounter: Payer: Self-pay | Admitting: Registered Nurse

## 2014-02-12 ENCOUNTER — Encounter: Payer: Medicare Other | Attending: Physical Medicine & Rehabilitation | Admitting: Registered Nurse

## 2014-02-12 VITALS — BP 140/61 | HR 75 | Resp 14 | Wt 222.4 lb

## 2014-02-12 DIAGNOSIS — E785 Hyperlipidemia, unspecified: Secondary | ICD-10-CM | POA: Diagnosis not present

## 2014-02-12 DIAGNOSIS — E039 Hypothyroidism, unspecified: Secondary | ICD-10-CM | POA: Insufficient documentation

## 2014-02-12 DIAGNOSIS — IMO0001 Reserved for inherently not codable concepts without codable children: Secondary | ICD-10-CM | POA: Insufficient documentation

## 2014-02-12 DIAGNOSIS — E538 Deficiency of other specified B group vitamins: Secondary | ICD-10-CM | POA: Insufficient documentation

## 2014-02-12 DIAGNOSIS — G8929 Other chronic pain: Secondary | ICD-10-CM | POA: Insufficient documentation

## 2014-02-12 DIAGNOSIS — R209 Unspecified disturbances of skin sensation: Secondary | ICD-10-CM | POA: Diagnosis not present

## 2014-02-12 DIAGNOSIS — Z79899 Other long term (current) drug therapy: Secondary | ICD-10-CM

## 2014-02-12 DIAGNOSIS — Z5181 Encounter for therapeutic drug level monitoring: Secondary | ICD-10-CM

## 2014-02-12 DIAGNOSIS — K219 Gastro-esophageal reflux disease without esophagitis: Secondary | ICD-10-CM | POA: Insufficient documentation

## 2014-02-12 DIAGNOSIS — M961 Postlaminectomy syndrome, not elsewhere classified: Secondary | ICD-10-CM | POA: Diagnosis not present

## 2014-02-12 DIAGNOSIS — E1149 Type 2 diabetes mellitus with other diabetic neurological complication: Secondary | ICD-10-CM | POA: Insufficient documentation

## 2014-02-12 DIAGNOSIS — M549 Dorsalgia, unspecified: Secondary | ICD-10-CM | POA: Insufficient documentation

## 2014-02-12 DIAGNOSIS — IMO0002 Reserved for concepts with insufficient information to code with codable children: Secondary | ICD-10-CM

## 2014-02-12 DIAGNOSIS — I10 Essential (primary) hypertension: Secondary | ICD-10-CM | POA: Insufficient documentation

## 2014-02-12 MED ORDER — MORPHINE SULFATE ER 15 MG PO TBCR: 15.0000 mg | EXTENDED_RELEASE_TABLET | Freq: Three times a day (TID) | ORAL | Status: DC

## 2014-02-12 NOTE — Progress Notes (Signed)
Subjective:    Patient ID: [name], female    DOB: 03/16/1951, 63 y.o.   MRN: [id number]  HPI: HPI:[name] is a 63 year old female who returns for follow up for chronic pain and medication refill. She says her pain is located in her mid-back. She rates her pain 5. Her current exercise regime is walking. She is walking 4-5 miles every other day. Her husband was in a motor vehicle accident, she says this has kept her busy caring for him.  Pain Inventory Average Pain 5 Pain Right Now 5 My pain is constant, burning and aching  In the last 24 hours, has pain interfered with the following? General activity 7 Relation with others 7 Enjoyment of life 7 What TIME of day is your pain at its worst? daytime and night Sleep (in general) Fair  Pain is worse with: walking, bending, sitting, standing and some activites Pain improves with: rest, heat/ice, therapy/exercise, pacing activities, medication, TENS and injections Relief from Meds: 5  Mobility walk without assistance how many minutes can you walk? 15 ability to climb steps?  yes do you drive?  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  no  Physicians involved in your care Any changes since last visit?  no   Family History  Problem Relation Age of Onset  . Breast cancer Mother   . Diabetes Mother   . Heart disease Mother   . Diabetes Father   . Heart disease Father   . Stomach cancer Maternal Uncle   . Breast cancer Paternal Aunt   . Colon cancer Neg Hx    History   Social History  . Marital Status: Married    Spouse Name: N/A    Number of Children: N/A  . Years of Education: N/A   Social History Main Topics  . Smoking status: Never Smoker   .  Smokeless tobacco: Never Used  . Alcohol Use: No  . Drug Use: No  . Sexual Activity: None   Other Topics Concern  . None   Social History Narrative  . None   Past Surgical History  Procedure Laterality Date  . Appendectomy    . Cholecystectomy    . Tubal ligation    . Tonsillectomy and adenoidectomy    . Anterior cervical decomp/discectomy fusion  2009    first procedure in ~ 1993/Dr Joya Salm   Past Medical History  Diagnosis Date  . GERD (gastroesophageal reflux disease)   . Unspecified essential hypertension   . Fibromyalgia   . B12 deficiency   . Depression   . Irritable bowel syndrome   . Hyperlipemia   . Migraines   . HYPOTHYROIDISM 05/29/2007  . Type II or unspecified type diabetes mellitus with neurological manifestations, not stated as uncontrolled 04/07/2013   BP 140/61  Pulse 75  Resp 14  Wt 222 lb 6.4 oz (100.88 kg)  SpO2 98%  Opioid Risk Score:   Fall Risk Score: Moderate Fall Risk (6-13 points) (previoulsy educated and given handout) Review of Systems  Gastrointestinal: Positive for constipation.  Genitourinary:       Bladder control problems  Musculoskeletal:       Spasms  Neurological: Positive for tremors, weakness and numbness.       Tingling  Psychiatric/Behavioral: Positive for dysphoric mood.  All other systems reviewed and are negative.      Objective:   Physical Exam  Nursing note and vitals reviewed. Constitutional: She is oriented to person, place, and time. She appears well-developed and well-nourished.  HENT:  Head: Normocephalic and atraumatic.  Neck: Normal range of motion. Neck supple.  Cardiovascular: Normal rate and regular rhythm.   Pulmonary/Chest: Effort normal and breath sounds normal.  Musculoskeletal:  Normal Muscle Bulk and Muscle testing Reveals: Upper Extremities: Full ROM and Muscle Strength 5/5 Spinal Forward Flexion 80 Degrees and extension 20 Degrees Thoracic Paraspinal Tenderness: T- 3- T-7 Lower  extremities: Full ROM and Muscle Strength 5/5 Arises from chair with ease Narrow Based gait  Neurological: She is alert and oriented to person, place, and time.  Skin: Skin is warm and dry.  Psychiatric: She has a normal mood and affect.          Assessment & Plan:  1. Cervical postlaminectomy syndrome: with neck and shoulder pain. Continue exercise routine and using heat therapy. Continue MS Contin 15 mg one tablet three times a day #90. 2. Fibromyalgia:  Continue with activity and exercise regime.   15 minutes of face to face patient care time was spent during this visit. All questions were encouraged and answered.   F/U in 1 month

## 2014-02-13 ENCOUNTER — Encounter: Payer: Self-pay | Admitting: Physical Medicine & Rehabilitation

## 2014-03-13 ENCOUNTER — Encounter: Payer: Self-pay | Admitting: Registered Nurse

## 2014-03-13 ENCOUNTER — Other Ambulatory Visit: Payer: Self-pay

## 2014-03-13 ENCOUNTER — Encounter: Payer: Medicare Other | Attending: Physical Medicine & Rehabilitation | Admitting: Registered Nurse

## 2014-03-13 VITALS — BP 123/45 | HR 70 | Resp 14 | Ht 65.0 in | Wt 225.0 lb

## 2014-03-13 DIAGNOSIS — I10 Essential (primary) hypertension: Secondary | ICD-10-CM | POA: Insufficient documentation

## 2014-03-13 DIAGNOSIS — E1149 Type 2 diabetes mellitus with other diabetic neurological complication: Secondary | ICD-10-CM | POA: Diagnosis not present

## 2014-03-13 DIAGNOSIS — E538 Deficiency of other specified B group vitamins: Secondary | ICD-10-CM | POA: Insufficient documentation

## 2014-03-13 DIAGNOSIS — G8929 Other chronic pain: Secondary | ICD-10-CM | POA: Diagnosis not present

## 2014-03-13 DIAGNOSIS — IMO0002 Reserved for concepts with insufficient information to code with codable children: Secondary | ICD-10-CM

## 2014-03-13 DIAGNOSIS — E039 Hypothyroidism, unspecified: Secondary | ICD-10-CM | POA: Insufficient documentation

## 2014-03-13 DIAGNOSIS — K219 Gastro-esophageal reflux disease without esophagitis: Secondary | ICD-10-CM | POA: Diagnosis not present

## 2014-03-13 DIAGNOSIS — IMO0001 Reserved for inherently not codable concepts without codable children: Secondary | ICD-10-CM

## 2014-03-13 DIAGNOSIS — M549 Dorsalgia, unspecified: Secondary | ICD-10-CM | POA: Insufficient documentation

## 2014-03-13 DIAGNOSIS — R209 Unspecified disturbances of skin sensation: Secondary | ICD-10-CM | POA: Diagnosis not present

## 2014-03-13 DIAGNOSIS — E785 Hyperlipidemia, unspecified: Secondary | ICD-10-CM | POA: Diagnosis not present

## 2014-03-13 DIAGNOSIS — M961 Postlaminectomy syndrome, not elsewhere classified: Secondary | ICD-10-CM | POA: Diagnosis not present

## 2014-03-13 DIAGNOSIS — Z79899 Other long term (current) drug therapy: Secondary | ICD-10-CM

## 2014-03-13 DIAGNOSIS — Z5181 Encounter for therapeutic drug level monitoring: Secondary | ICD-10-CM

## 2014-03-13 MED ORDER — MORPHINE SULFATE ER 15 MG PO TBCR: 15.0000 mg | EXTENDED_RELEASE_TABLET | Freq: Three times a day (TID) | ORAL | Status: DC

## 2014-03-13 NOTE — Progress Notes (Signed)
Subjective:    Patient ID: [name], female    DOB: Jun 20, 1951, 63 y.o.   MRN: [id number]  HPI: HPI:[name] is a 63 year old female who returns for follow up for chronic pain and medication refill. She says her pain is located in her neck, right shoulder and mid-back. She rates her pain 5. Her current exercise regime is performing stretching exercises and walking. She is walking 4-5 miles every other day.   Pain Inventory Average Pain 5 Pain Right Now 5 My pain is constant and burning  In the last 24 hours, has pain interfered with the following? General activity 7 Relation with others 8 Enjoyment of life 7 What TIME of day is your pain at its worst? evening,night Sleep (in general) Fair  Pain is worse with: walking, bending, sitting, standing and some activites Pain improves with: rest, heat/ice, therapy/exercise, pacing activities, medication, TENS and injections Relief from Meds: 6  Mobility walk without assistance ability to climb steps?  yes do you drive?  yes transfers alone Do you have any goals in this area?  yes  Function employed # of hrs/week 10 disabled: date disabled 12/2008 I need assistance with the following:  meal prep, household duties and shopping Do you have any goals in this area?  yes  Neuro/Psych bladder control problems weakness tremor spasms depression  Prior Studies Any changes since last visit?  no  Physicians involved in your care Any changes since last visit?  no   Family History  Problem Relation Age of Onset  . Breast cancer Mother   . Diabetes Mother   . Heart disease Mother   . Diabetes Father   . Heart disease Father   . Stomach cancer Maternal Uncle   . Breast cancer Paternal Aunt   . Colon cancer Neg Hx    History   Social History  . Marital Status: Married    Spouse Name: N/A    Number of Children: N/A  . Years of Education: N/A   Social History Main Topics  . Smoking status: Never Smoker   .  Smokeless tobacco: Never Used  . Alcohol Use: No  . Drug Use: No  . Sexual Activity: None   Other Topics Concern  . None   Social History Narrative  . None   Past Surgical History  Procedure Laterality Date  . Appendectomy    . Cholecystectomy    . Tubal ligation    . Tonsillectomy and adenoidectomy    . Anterior cervical decomp/discectomy fusion  2009    first procedure in ~ 1993/Dr Joya Salm   Past Medical History  Diagnosis Date  . GERD (gastroesophageal reflux disease)   . Unspecified essential hypertension   . Fibromyalgia   . B12 deficiency   . Depression   . Irritable bowel syndrome   . Hyperlipemia   . Migraines   . HYPOTHYROIDISM 05/29/2007  . Type II or unspecified type diabetes mellitus with neurological manifestations, not stated as uncontrolled 04/07/2013   BP 123/45  Pulse 70  Resp 14  Ht 5\' 5"  (1.651 m)  Wt 225 lb (102.059 kg)  BMI 37.44 kg/m2  SpO2 98%  Opioid Risk Score:   Fall Risk Score: Moderate Fall Risk (6-13 points)    Review of Systems  Gastrointestinal: Positive for constipation.  Genitourinary:       Bladder control problems  Musculoskeletal: Positive for back pain.  Neurological: Positive for tremors and weakness.  Spasms  Psychiatric/Behavioral:       Depression  All other systems reviewed and are negative.      Objective:   Physical Exam  Nursing note and vitals reviewed. Constitutional: She appears well-developed and well-nourished.  HENT:  Head: Normocephalic and atraumatic.  Neck: Normal range of motion. Neck supple.  Cervical Paraspinal Tenderness: C-3- C-5  Cardiovascular: Normal rate and regular rhythm.   Pulmonary/Chest: Effort normal and breath sounds normal.  Musculoskeletal:  Normal Muscle Bulk and Muscle Testing Reveals: Upper Extremities: Full ROM and Muscle Strength 5/5 Right AC Joint Tenderness Right Spine of Scapula Tenderness Thoracic Paraspinal Tenderness: T-7- T-10 Lower Extremities: Full ROM  and Muscle Strength 5/5 Arises from chair with ease Narrow Based Gait  Neurological: She is alert.  Skin: Skin is warm and dry.  Psychiatric: She has a normal mood and affect.          Assessment & Plan:  1. Cervical postlaminectomy syndrome: with neck and shoulder pain. Continue exercise routine and using heat therapy. Continue MS Contin 15 mg one tablet three times a day #90.  2. Fibromyalgia: Continue with activity and exercise regime.   15 minutes of face to face patient care time was spent during this visit. All questions were encouraged and answered.   F/U in 1 month

## 2014-03-14 ENCOUNTER — Ambulatory Visit: Payer: Medicare Other | Admitting: Registered Nurse

## 2014-03-19 ENCOUNTER — Ambulatory Visit: Payer: Medicare Other | Admitting: Family Medicine

## 2014-03-20 ENCOUNTER — Telehealth: Payer: Self-pay | Admitting: Family Medicine

## 2014-03-20 ENCOUNTER — Encounter: Payer: Self-pay | Admitting: Family Medicine

## 2014-03-20 NOTE — Telephone Encounter (Signed)
Dis-regard phone note from 03/20/14 @ 11:01 documented in error/rbh

## 2014-03-20 NOTE — Telephone Encounter (Signed)
F/u CPX in a few months should be fine.

## 2014-03-20 NOTE — Telephone Encounter (Signed)
Put form on waynetta  desk's

## 2014-03-20 NOTE — Telephone Encounter (Signed)
Patient did not come for their scheduled appointment 03/19/14 forFOLLOW UP / [facility] diabetic bundle BP check .  Please let me know if the patient needs to be contacted immediately for follow up or if no follow up is necessary.

## 2014-03-20 NOTE — Telephone Encounter (Signed)
Sent pt now show letter

## 2014-04-10 ENCOUNTER — Encounter: Payer: Medicare Other | Attending: Physical Medicine & Rehabilitation | Admitting: Registered Nurse

## 2014-04-10 ENCOUNTER — Other Ambulatory Visit: Payer: Self-pay | Admitting: Family Medicine

## 2014-04-10 ENCOUNTER — Encounter: Payer: Self-pay | Admitting: Registered Nurse

## 2014-04-10 VITALS — BP 128/50 | HR 76 | Resp 14 | Ht 65.0 in | Wt 224.0 lb

## 2014-04-10 DIAGNOSIS — Z79899 Other long term (current) drug therapy: Secondary | ICD-10-CM | POA: Diagnosis present

## 2014-04-10 DIAGNOSIS — IMO0002 Reserved for concepts with insufficient information to code with codable children: Secondary | ICD-10-CM

## 2014-04-10 DIAGNOSIS — Z5181 Encounter for therapeutic drug level monitoring: Secondary | ICD-10-CM

## 2014-04-10 DIAGNOSIS — M961 Postlaminectomy syndrome, not elsewhere classified: Secondary | ICD-10-CM

## 2014-04-10 DIAGNOSIS — G894 Chronic pain syndrome: Secondary | ICD-10-CM

## 2014-04-10 DIAGNOSIS — M797 Fibromyalgia: Secondary | ICD-10-CM

## 2014-04-10 DIAGNOSIS — M5135 Other intervertebral disc degeneration, thoracolumbar region: Secondary | ICD-10-CM

## 2014-04-10 MED ORDER — MORPHINE SULFATE ER 15 MG PO TBCR: 15.0000 mg | EXTENDED_RELEASE_TABLET | Freq: Three times a day (TID) | ORAL | Status: DC

## 2014-04-10 NOTE — Progress Notes (Signed)
Subjective:    Patient ID: [name], female    DOB: 08-May-1951, 63 y.o.   MRN: [id number]  HPI: [name] is a 63 year old female who returns for follow up for chronic pain and medication refill. She says her pain is located in her mid-back. She rates her pain 5. Her current exercise regime is performing stretching exercises and walking. She is walking 4-5 miles every other day.   Pain Inventory Average Pain 5 Pain Right Now 5 My pain is constant, burning and aching  In the last 24 hours, has pain interfered with the following? General activity 5 Relation with others 6 Enjoyment of life 5 What TIME of day is your pain at its worst? morning and evening Sleep (in general) Fair  Pain is worse with: walking, bending, sitting, inactivity, standing and some activites Pain improves with: rest, heat/ice, therapy/exercise, pacing activities and medication Relief from Meds: 6  Mobility walk without assistance how many minutes can you walk? 10-15 minutes ability to climb steps?  yes do you drive?  yes  Function disabled: date disabled 12/2009  Neuro/Psych bladder control problems weakness numbness tingling depression  Prior Studies Any changes since last visit?  no  Physicians involved in your care Any changes since last visit?  no   Family History  Problem Relation Age of Onset  . Breast cancer Mother   . Diabetes Mother   . Heart disease Mother   . Diabetes Father   . Heart disease Father   . Stomach cancer Maternal Uncle   . Breast cancer Paternal Aunt   . Colon cancer Neg Hx    History   Social History  . Marital Status: Married    Spouse Name: N/A    Number of Children: N/A  . Years of Education: N/A   Social History Main Topics  . Smoking status: Never Smoker   . Smokeless tobacco: Never Used  . Alcohol Use: No  . Drug Use: No  . Sexual Activity: None   Other Topics Concern  . None   Social History Narrative  . None   Past  Surgical History  Procedure Laterality Date  . Appendectomy    . Cholecystectomy    . Tubal ligation    . Tonsillectomy and adenoidectomy    . Anterior cervical decomp/discectomy fusion  2009    first procedure in ~ 1993/Dr Joya Salm   Past Medical History  Diagnosis Date  . GERD (gastroesophageal reflux disease)   . Unspecified essential hypertension   . Fibromyalgia   . B12 deficiency   . Depression   . Irritable bowel syndrome   . Hyperlipemia   . Migraines   . HYPOTHYROIDISM 05/29/2007  . Type II or unspecified type diabetes mellitus with neurological manifestations, not stated as uncontrolled 04/07/2013   BP 128/50  Pulse 76  Resp 14  Ht 5\' 5"  (1.651 m)  Wt 224 lb (101.606 kg)  BMI 37.28 kg/m2  SpO2 99%  Opioid Risk Score:   Fall Risk Score: Low Fall Risk (0-5 points)   Review of Systems     Objective:   Physical Exam  Nursing note and vitals reviewed. Constitutional: She is oriented to person, place, and time. She appears well-developed and well-nourished.  HENT:  Head: Normocephalic and atraumatic.  Neck: Normal range of motion. Neck supple.  Cardiovascular: Normal rate and regular rhythm.   Pulmonary/Chest: Effort normal and breath sounds normal.  Musculoskeletal:  Normal Muscle Bulk and Muscle testing  Reveals: Upper Extremities: Full ROM and Muscle Strength 5/5 Thoracic Paraspinal Tenderness: T-5- T-8 Lower Extremities: Full ROM and Muscle Strength 5/5 Arises from chair with ease Narrow Based Gait  Neurological: She is alert and oriented to person, place, and time.  Skin: Skin is warm and dry.  Psychiatric: She has a normal mood and affect.          Assessment & Plan:  1. Cervical postlaminectomy syndrome: with neck and shoulder pain. Continue exercise routine and using heat therapy. Continue MS Contin 15 mg one tablet three times a day #90.  2. Fibromyalgia: Continue with activity and exercise regime.  15 minutes of face to face patient care time  was spent during this visit. All questions were encouraged and answered.  F/U in 1 month

## 2014-04-16 ENCOUNTER — Encounter: Payer: Self-pay | Admitting: Family Medicine

## 2014-04-16 ENCOUNTER — Ambulatory Visit (INDEPENDENT_AMBULATORY_CARE_PROVIDER_SITE_OTHER): Payer: Medicare Other | Admitting: Family Medicine

## 2014-04-16 VITALS — BP 120/60 | HR 75 | Temp 98.5°F | Ht 65.0 in | Wt 224.8 lb

## 2014-04-16 DIAGNOSIS — E1140 Type 2 diabetes mellitus with diabetic neuropathy, unspecified: Secondary | ICD-10-CM

## 2014-04-16 DIAGNOSIS — E039 Hypothyroidism, unspecified: Secondary | ICD-10-CM

## 2014-04-16 DIAGNOSIS — E782 Mixed hyperlipidemia: Secondary | ICD-10-CM

## 2014-04-16 DIAGNOSIS — R61 Generalized hyperhidrosis: Secondary | ICD-10-CM

## 2014-04-16 DIAGNOSIS — F3341 Major depressive disorder, recurrent, in partial remission: Secondary | ICD-10-CM

## 2014-04-16 MED ORDER — OXYBUTYNIN CHLORIDE ER 5 MG PO TB24: 5.0000 mg | ORAL_TABLET | Freq: Every day | ORAL | Status: DC

## 2014-04-16 NOTE — Progress Notes (Signed)
Dr. Frederico Hamman T. Axyl Sitzman, MD, [facility] [place], [address] Phone: [phone] Fax: [phone]  04/16/2014  Patient: [name], MRN: [id number], DOB: 1950/12/20, 64 y.o.  Primary Physician:  Owens Loffler, MD  Chief Complaint: Follow-up and Urticaria  Subjective:   [name] is a 63 y.o. very pleasant female patient who presents with the following:  Not enough time for prevnar.  Flu? no  Diabetes Mellitus: Tolerating Medications: yes Compliance with diet: fair Exercise: minimal / intermittent Avg blood sugars at home: not checking Foot problems: none Hypoglycemia: none No nausea, vomitting, blurred vision, polyuria.  Lab Results  Component Value Date   HGBA1C 6.2 01/13/2014   HGBA1C 6.2 08/21/2013   HGBA1C 7.7* 04/05/2013   Lab Results  Component Value Date   MICROALBUR 0.2 01/13/2014   LDLCALC 90 01/13/2014   CREATININE 0.8 01/13/2014    Wt Readings from Last 3 Encounters:  04/16/14 224 lb 12 oz (101.946 kg)  04/10/14 224 lb (101.606 kg)  03/13/14 225 lb (102.059 kg)    Body mass index is 37.4 kg/(m^2).   Lipids: Doing well, stable. Tolerating meds fine with no SE. Panel reviewed with patient.  Lipids:    Component Value Date/Time   CHOL 175 01/13/2014 1037   TRIG 213.0* 01/13/2014 1037   HDL 42.80 01/13/2014 1037   LDLDIRECT 113.0 04/03/2013 1307   VLDL 42.6* 01/13/2014 1037   CHOLHDL 4 01/13/2014 1037    Lab Results  Component Value Date   ALT 26 01/13/2014   AST 27 01/13/2014   ALKPHOS 88 01/13/2014   BILITOT 0.6 01/13/2014    HTN: Tolerating all medications without side effects Stable and at goal No CP, no sob. No HA.  BP Readings from Last 3 Encounters:  04/16/14 120/60  04/10/14 128/50  03/13/14 277/82    Basic Metabolic Panel:    Component Value Date/Time   NA 139 01/13/2014 1037   K 4.2 01/13/2014 1037   CL 101 01/13/2014 1037   CO2 29 01/13/2014 1037   BUN 21  01/13/2014 1037   CREATININE 0.8 01/13/2014 1037   GLUCOSE 91 01/13/2014 1037   CALCIUM 9.2 01/13/2014 1037   Generalized significant sweating spells continues. Daily  Also with intermittent hives.  Thyroid: No symptoms. Labs reviewed. Denies cold / heat intolerance, dry skin, hair loss. No goiter.  Lab Results  Component Value Date   TSH 3.77 01/13/2014     Past Medical History, Surgical History, Social History, Family History, Problem List, Medications, and Allergies have been reviewed and updated if relevant.   GEN: No acute illnesses, no fevers, chills. GI: No n/v/d, eating normally Pulm: No SOB Interactive and getting along well at home.  Otherwise, ROS is as per the HPI.  Objective:   BP 120/60  Pulse 75  Temp(Src) 98.5 F (36.9 C) (Oral)  Ht 5' 5"  (1.651 m)  Wt 224 lb 12 oz (101.946 kg)  BMI 37.40 kg/m2  GEN: WDWN, NAD, Non-toxic, A & O x 3 HEENT: Atraumatic, Normocephalic. Neck supple. No masses, No LAD. Ears and Nose: No external deformity. CV: RRR, No M/G/R. No JVD. No thrill. No extra heart sounds. PULM: CTA B, no wheezes, crackles, rhonchi. No retractions. No resp. distress. No accessory muscle use. EXTR: No c/c/e NEURO Normal gait.  PSYCH: Normally interactive. Conversant. Not depressed or anxious appearing.  Calm demeanor.   Laboratory and Imaging Data: Results for orders placed in visit on  01/13/14  LIPID PANEL      Result Value Ref Range   Cholesterol 175  0 - 200 mg/dL   Triglycerides 213.0 (*) 0.0 - 149.0 mg/dL   HDL 42.80  >39.00 mg/dL   VLDL 42.6 (*) 0.0 - 40.0 mg/dL   LDL Cholesterol 90  0 - 99 mg/dL   Total CHOL/HDL Ratio 4     NonHDL 132.20    HEMOGLOBIN A1C      Result Value Ref Range   Hemoglobin A1C 6.2  4.6 - 6.5 %  HEPATIC FUNCTION PANEL      Result Value Ref Range   Total Bilirubin 0.6  0.2 - 1.2 mg/dL   Bilirubin, Direct 0.1  0.0 - 0.3 mg/dL   Alkaline Phosphatase 88  39 - 117 U/L   AST 27  0 - 37 U/L   ALT 26  0 - 35 U/L    Total Protein 7.1  6.0 - 8.3 g/dL   Albumin 3.7  3.5 - 5.2 g/dL  BASIC METABOLIC PANEL      Result Value Ref Range   Sodium 139  135 - 145 mEq/L   Potassium 4.2  3.5 - 5.1 mEq/L   Chloride 101  96 - 112 mEq/L   CO2 29  19 - 32 mEq/L   Glucose, Bld 91  70 - 99 mg/dL   BUN 21  6 - 23 mg/dL   Creatinine, Ser 0.8  0.4 - 1.2 mg/dL   Calcium 9.2  8.4 - 10.5 mg/dL   GFR 78.33  >60.00 mL/min  CBC WITH DIFFERENTIAL      Result Value Ref Range   WBC 8.3  4.0 - 10.5 K/uL   RBC 4.03  3.87 - 5.11 Mil/uL   Hemoglobin 12.3  12.0 - 15.0 g/dL   HCT 36.5  36.0 - 46.0 %   MCV 90.5  78.0 - 100.0 fl   MCHC 33.5  30.0 - 36.0 g/dL   RDW 13.1  11.5 - 15.5 %   Platelets 257.0  150.0 - 400.0 K/uL   Neutrophils Relative % 64.1  43.0 - 77.0 %   Lymphocytes Relative 27.9  12.0 - 46.0 %   Monocytes Relative 5.4  3.0 - 12.0 %   Eosinophils Relative 2.3  0.0 - 5.0 %   Basophils Relative 0.3  0.0 - 3.0 %   Neutro Abs 5.3  1.4 - 7.7 K/uL   Lymphs Abs 2.3  0.7 - 4.0 K/uL   Monocytes Absolute 0.4  0.1 - 1.0 K/uL   Eosinophils Absolute 0.2  0.0 - 0.7 K/uL   Basophils Absolute 0.0  0.0 - 0.1 K/uL  VITAMIN D 25 HYDROXY      Result Value Ref Range   VITD 39.23    TSH      Result Value Ref Range   TSH 3.77  0.35 - 4.50 uIU/mL  MICROALBUMIN / CREATININE URINE RATIO      Result Value Ref Range   Microalb, Ur 0.2  0.0 - 1.9 mg/dL   Creatinine,U 72.2     Microalb Creat Ratio 0.3  0.0 - 30.0 mg/g     Assessment and Plan:   Type 2 diabetes mellitus with diabetic neuropathy, doing great  Generalized hyperhidrosis: trial of ditropan  HYPERLIPIDEMIA: stable  Hypothyroidism, unspecified hypothyroidism type, stable  Depression, major, recurrent, in partial remission, stable   Follow-up: 6 mo  New Prescriptions   OXYBUTYNIN (DITROPAN-XL) 5 MG 24 HR TABLET    Take 1 tablet (  5 mg total) by mouth at bedtime.   No orders of the defined types were placed in this encounter.    Signed,  Maud Deed. Vianney Kopecky,  MD   Patient's Medications  New Prescriptions   OXYBUTYNIN (DITROPAN-XL) 5 MG 24 HR TABLET    Take 1 tablet (5 mg total) by mouth at bedtime.  Previous Medications   ASCORBIC ACID (VITAMIN C) 1000 MG TABLET    Take 1,000 mg by mouth 2 (two) times daily.   ASPIRIN 81 MG TABLET    Take 81 mg by mouth daily.     B COMPLEX VITAMINS (B COMPLEX 100 PO)    Take 1 tablet by mouth daily.   CALCIUM CARBONATE (CALCIUM 600 PO)    Take by mouth 2 (two) times daily.    CETIRIZINE (ZYRTEC) 10 MG TABLET    Take 10 mg by mouth daily.     CHOLECALCIFEROL (VITAMIN D) 2000 UNITS TABLET    Take 2,000 Units by mouth daily.     CITALOPRAM (CELEXA) 40 MG TABLET    TAKE 1 TABLET BY MOUTH DAILY   CLINDAMYCIN (CLEOCIN) 300 MG CAPSULE    Take 300 mg by mouth. before dental appointments   COENZYME Q-10 100 MG CAPSULE    Take 100 mg by mouth daily.   GABAPENTIN (NEURONTIN) 600 MG TABLET    Take 1 tablet (600 mg total) by mouth 3 (three) times daily.   GLUCOSE MONITORING KIT (FREESTYLE) MONITORING KIT    1 each by Does not apply route as needed for other. DISPENSE CHEAPEST KIT, PER PATIENT INSURANCE, BID TESTING, DX 250.00   LEVOTHYROXINE (SYNTHROID, LEVOTHROID) 50 MCG TABLET    TAKE 1 TABLET BY MOUTH DAILY   LORATADINE (CLARITIN) 10 MG TABLET    Take 10 mg by mouth daily.   MELATON-THEAN-CHAM-PASSF-LBALM (MELATONIN + L-THEANINE PO)    Take by mouth at bedtime. 95m/25mg tablet    METFORMIN (GLUCOPHAGE) 500 MG TABLET    TAKE 1 TABLET BY MOUTH TWICE A DAY WITH A MEAL   METHOCARBAMOL (ROBAXIN) 500 MG TABLET    Take 1 tablet (500 mg total) by mouth every 8 (eight) hours as needed for muscle spasms.   MIRTAZAPINE (REMERON) 15 MG TABLET    TAKE ONE TABLET BY MOUTH EVERY NIGHT AT BEDTIME   MORPHINE (MS CONTIN) 15 MG 12 HR TABLET    Take 1 tablet (15 mg total) by mouth 3 (three) times daily.   RABEPRAZOLE (ACIPHEX) 20 MG TABLET    TAKE 1 TABLET BY MOUTH DAILY   SENNA-DOCUSATE (PERI-COLACE) 8.6-50 MG PER TABLET    Take 1 tablet  by mouth at bedtime as needed for mild constipation.  Modified Medications   No medications on file  Discontinued Medications   DOCUSATE SODIUM (COLACE) 100 MG CAPSULE    Take 100-200 mg by mouth at bedtime as needed.    METRONIDAZOLE (METROCREAM) 0.75 % CREAM    as needed.    MUPIROCIN OINTMENT (BACTROBAN) 2 %    Place 1 application into the nose 2 (two) times daily. For 1 week   SENNA-DOCUSATE (SENOKOT-S) 8.6-50 MG PER TABLET    Take 1 tablet by mouth at bedtime as needed for mild constipation.

## 2014-04-16 NOTE — Progress Notes (Signed)
Pre visit review using our clinic review tool, if applicable. No additional management support is needed unless otherwise documented below in the visit note. 

## 2014-04-17 ENCOUNTER — Encounter: Payer: Self-pay | Admitting: Family Medicine

## 2014-04-17 DIAGNOSIS — R61 Generalized hyperhidrosis: Secondary | ICD-10-CM

## 2014-04-17 HISTORY — DX: Generalized hyperhidrosis: R61

## 2014-04-24 ENCOUNTER — Other Ambulatory Visit: Payer: Self-pay | Admitting: Family Medicine

## 2014-05-08 ENCOUNTER — Encounter: Payer: Self-pay | Admitting: Registered Nurse

## 2014-05-08 ENCOUNTER — Encounter: Payer: Medicare Other | Attending: Physical Medicine & Rehabilitation | Admitting: Registered Nurse

## 2014-05-08 VITALS — BP 123/46 | HR 65 | Resp 14 | Ht 65.0 in | Wt 226.0 lb

## 2014-05-08 DIAGNOSIS — M545 Low back pain, unspecified: Secondary | ICD-10-CM

## 2014-05-08 DIAGNOSIS — Z5181 Encounter for therapeutic drug level monitoring: Secondary | ICD-10-CM | POA: Insufficient documentation

## 2014-05-08 DIAGNOSIS — M797 Fibromyalgia: Secondary | ICD-10-CM

## 2014-05-08 DIAGNOSIS — Z79899 Other long term (current) drug therapy: Secondary | ICD-10-CM | POA: Insufficient documentation

## 2014-05-08 DIAGNOSIS — M5135 Other intervertebral disc degeneration, thoracolumbar region: Secondary | ICD-10-CM

## 2014-05-08 DIAGNOSIS — G894 Chronic pain syndrome: Secondary | ICD-10-CM | POA: Diagnosis present

## 2014-05-08 DIAGNOSIS — M961 Postlaminectomy syndrome, not elsewhere classified: Secondary | ICD-10-CM

## 2014-05-08 DIAGNOSIS — IMO0002 Reserved for concepts with insufficient information to code with codable children: Secondary | ICD-10-CM

## 2014-05-08 MED ORDER — MORPHINE SULFATE ER 15 MG PO TBCR: 15.0000 mg | EXTENDED_RELEASE_TABLET | Freq: Three times a day (TID) | ORAL | Status: DC

## 2014-05-08 NOTE — Progress Notes (Signed)
Subjective:    Patient ID: [name], female    DOB: Oct 29, 1950, 63 y.o.   MRN: [id number]  HPI: [name] is a 63 year old female who returns for follow up for chronic pain and medication refill. She says her pain is located in her left jaw, mid-back, bilateral knees and right ankle. She rates her pain 5. Her current exercise regime is performing stretching exercises and walking. She is walking 4-5 miles every other day.   Pain Inventory Average Pain 5 Pain Right Now 5 My pain is constant, burning and aching  In the last 24 hours, has pain interfered with the following? General activity 7 Relation with others 7 Enjoyment of life 7 What TIME of day is your pain at its worst? evening, night Sleep (in general) Fair  Pain is worse with: walking, bending, sitting, inactivity, standing and some activites Pain improves with: rest, heat/ice, therapy/exercise, pacing activities, medication, TENS and injections Relief from Meds: 6  Mobility walk without assistance how many minutes can you walk? 10-15 ability to climb steps?  yes do you drive?  yes Do you have any goals in this area?  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  yes  Neuro/Psych bladder control problems weakness numbness tremor tingling dizziness depression  Prior Studies Any changes since last visit?  no  Physicians involved in your care Any changes since last visit?  no   Family History  Problem Relation Age of Onset  . Breast cancer Mother   . Diabetes Mother   . Heart disease Mother   . Diabetes Father   . Heart disease Father   . Stomach cancer Maternal Uncle   . Breast cancer Paternal Aunt   . Colon cancer Neg Hx    History   Social History  . Marital Status: Married    Spouse Name: N/A   Number of Children: N/A  . Years of Education: N/A   Social History Main Topics  . Smoking status: Never Smoker   . Smokeless tobacco: Never Used  . Alcohol Use: No  . Drug Use: No  . Sexual Activity: None   Other Topics Concern  . None   Social History Narrative   Past Surgical History  Procedure Laterality Date  . Appendectomy    . Cholecystectomy    . Tubal ligation    . Tonsillectomy and adenoidectomy    . Anterior cervical decomp/discectomy fusion  2009    first procedure in ~ 1993/Dr Joya Salm   Past Medical History  Diagnosis Date  . GERD (gastroesophageal reflux disease)   . Unspecified essential hypertension   . Fibromyalgia   . B12 deficiency   . Depression   . Irritable bowel syndrome   . Hyperlipemia   . Migraines   . HYPOTHYROIDISM 05/29/2007  . Type II or unspecified type diabetes mellitus with neurological manifestations, not stated as uncontrolled 04/07/2013  . Generalized hyperhidrosis 04/17/2014   BP 123/46 mmHg  Pulse 65  Resp 14  Ht 5\' 5"  (1.651 m)  Wt 226 lb (102.513 kg)  BMI 37.61 kg/m2  SpO2 95%  Opioid Risk Score:   Fall Risk Score: Moderate Fall Risk (6-13 points) Review of Systems  Cardiovascular: Positive for leg swelling.  Musculoskeletal: Positive for myalgias, back pain, joint swelling, arthralgias and neck  pain.  Neurological: Positive for tremors, weakness and numbness.  Psychiatric/Behavioral: Positive for dysphoric mood.  All other systems reviewed and are negative.      Objective:   Physical Exam  Constitutional: She is oriented to person, place, and time. She appears well-developed and well-nourished.  HENT:  Head: Normocephalic and atraumatic.  Neck: Normal range of motion. Neck supple.  Cervical Paraspinal Tenderness: C-6- C-7  Cardiovascular: Normal rate and regular rhythm.   Pulmonary/Chest: Effort normal and breath sounds normal.  Musculoskeletal:  Normal Muscle Bulk and Muscle testing Reveals: Upper Extremities:  Full ROM and Muscle Strength 5/5 Thoracic Paraspinal Tenderness: T-4- T-7 Lower Extremities: Full ROM and Muscle strength 5/5 Arises from chair with ease Narrow based Gait  Neurological: She is alert and oriented to person, place, and time.  Skin: Skin is warm and dry.  Psychiatric: She has a normal mood and affect.  Nursing note and vitals reviewed.         Assessment & Plan:  1. Cervical postlaminectomy syndrome: with neck and shoulder pain. Continue exercise routine and using heat therapy. Continue MS Contin 15 mg one tablet three times a day #90.  2. Fibromyalgia: Continue with activity and exercise regime. 3. TMJ: On Ibuprofen/  Dentist Following   15 minutes of face to face patient care time was spent during this visit. All questions were encouraged and answered.   F/U in 1 month

## 2014-05-26 ENCOUNTER — Telehealth: Payer: Self-pay | Admitting: *Deleted

## 2014-05-26 NOTE — Telephone Encounter (Signed)
Patient left a voice mail stating that she was in a few weeks ago and was given Oxybutynin and was told to take one a day for a few days and then increase to 2 day. Patient was to call back and let Dr. Lorelei Pont know if the medication helped and it has. Patient requested a new script for the 10 mg be sent to [facility].

## 2014-05-26 NOTE — Telephone Encounter (Signed)
Send in rx as requested.

## 2014-05-27 MED ORDER — OXYBUTYNIN CHLORIDE ER 10 MG PO TB24: 10.0000 mg | ORAL_TABLET | Freq: Every day | ORAL | Status: DC

## 2014-05-27 NOTE — Telephone Encounter (Signed)
Ms. Struthers notified prescription has been sent to [facility] as requested.

## 2014-06-05 ENCOUNTER — Encounter: Payer: Self-pay | Admitting: Registered Nurse

## 2014-06-05 ENCOUNTER — Encounter: Payer: Medicare Other | Attending: Physical Medicine & Rehabilitation | Admitting: Registered Nurse

## 2014-06-05 VITALS — BP 129/59 | HR 67 | Resp 14 | Wt 228.6 lb

## 2014-06-05 DIAGNOSIS — M545 Low back pain, unspecified: Secondary | ICD-10-CM

## 2014-06-05 DIAGNOSIS — Z79899 Other long term (current) drug therapy: Secondary | ICD-10-CM

## 2014-06-05 DIAGNOSIS — M5135 Other intervertebral disc degeneration, thoracolumbar region: Secondary | ICD-10-CM

## 2014-06-05 DIAGNOSIS — Z5181 Encounter for therapeutic drug level monitoring: Secondary | ICD-10-CM | POA: Diagnosis present

## 2014-06-05 DIAGNOSIS — G894 Chronic pain syndrome: Secondary | ICD-10-CM

## 2014-06-05 DIAGNOSIS — M797 Fibromyalgia: Secondary | ICD-10-CM

## 2014-06-05 DIAGNOSIS — M961 Postlaminectomy syndrome, not elsewhere classified: Secondary | ICD-10-CM

## 2014-06-05 DIAGNOSIS — IMO0002 Reserved for concepts with insufficient information to code with codable children: Secondary | ICD-10-CM

## 2014-06-05 MED ORDER — MORPHINE SULFATE ER 15 MG PO TBCR: 15.0000 mg | EXTENDED_RELEASE_TABLET | Freq: Three times a day (TID) | ORAL | Status: DC

## 2014-06-05 NOTE — Progress Notes (Signed)
Subjective:    Patient ID: [name], female    DOB: 05/01/1951, 63 y.o. MRN: [id number]  HPI: Ms. LATISHA LASCH is a 63 year old female who returns for follow up for chronic pain and medication refill. She says her pain is located in her right shoulder, upper-mid-back.She rates her pain 5. Her current exercise regime is performing stretching exercises and walking. She is walking 4-5 miles every other day.   Pain Inventory Average Pain 5 Pain Right Now 5 My pain is burning and aching  In the last 24 hours, has pain interfered with the following? General activity 5 Relation with others 6 Enjoyment of life 5 What TIME of day is your pain at its worst? evening and night Sleep (in general) Fair  Pain is worse with: walking, bending, sitting, standing and some activites Pain improves with: rest, heat/ice, therapy/exercise, pacing activities, medication and injections Relief from Meds: 6  Mobility walk without assistance how many minutes can you walk? 10-15 ability to climb steps?  yes do you drive?  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  no  Physicians involved in your care Any changes since last visit?  no   Family History  Problem Relation Age of Onset  . Breast cancer Mother   . Diabetes Mother   . Heart disease Mother   . Diabetes Father   . Heart disease Father   . Stomach cancer Maternal Uncle   . Breast cancer Paternal Aunt   . Colon cancer Neg Hx    History   Social History  . Marital Status: Married    Spouse Name: N/A    Number of Children: N/A  . Years of Education: N/A   Social History Main Topics  . Smoking status: Never Smoker   . Smokeless tobacco: Never Used  . Alcohol Use:  No  . Drug Use: No  . Sexual Activity: None   Other Topics Concern  . None   Social History Narrative   Past Surgical History  Procedure Laterality Date  . Appendectomy    . Cholecystectomy    . Tubal ligation    . Tonsillectomy and adenoidectomy    . Anterior cervical decomp/discectomy fusion  2009    first procedure in ~ 1993/Dr Joya Salm   Past Medical History  Diagnosis Date  . GERD (gastroesophageal reflux disease)   . Unspecified essential hypertension   . Fibromyalgia   . B12 deficiency   . Depression   . Irritable bowel syndrome   . Hyperlipemia   . Migraines   . HYPOTHYROIDISM 05/29/2007  . Type II or unspecified type diabetes mellitus with neurological manifestations, not stated as uncontrolled 04/07/2013  . Generalized hyperhidrosis 04/17/2014   BP 129/59 mmHg  Pulse 67  Resp 14  Wt 228 lb 9.6 oz (103.692 kg)  SpO2 98%  Opioid Risk Score:   Fall Risk Score: Moderate Fall Risk (6-13 points) (previously educated and given handout)  Review of Systems  Genitourinary:       Bladder control problems  Neurological: Positive for tremors, weakness and numbness.  Psychiatric/Behavioral: Positive for dysphoric mood.  All other systems reviewed and are negative.      Objective:   Physical Exam  Constitutional: She is oriented to person,  place, and time. She appears well-developed and well-nourished.  HENT:  Head: Normocephalic and atraumatic.  Neck: Normal range of motion. Neck supple.  Cervical Paraspinal Tenderness: C-7  Cardiovascular: Normal rate and regular rhythm.   Pulmonary/Chest: Effort normal and breath sounds normal.  Musculoskeletal:  Normal Muscle Bulk and Muscle Testing Reveals: Upper Extremities: Full ROM and Muscle Strength 5/5 Thoracic Paraspinal Tenderness: T-1- T-3 T-6- T-8 Right Spine of Scapula Tenderness Lower Extremities: Full ROM and Muscle Strength 5/5 Arises from chair with ease Narrow based gait  Neurological: She is alert and  oriented to person, place, and time.  Skin: Skin is warm and dry.  Psychiatric: She has a normal mood and affect.  Nursing note and vitals reviewed.         Assessment & Plan:  1. Cervical postlaminectomy syndrome: with neck and shoulder pain. Continue exercise routine and using heat therapy. Continue MS Contin 15 mg one tablet three times a day #90.  2. Fibromyalgia: Continue with activity and exercise regime.  15 minutes of face to face patient care time was spent during this visit. All questions were encouraged and answered.   F/U in 1 month

## 2014-06-07 ENCOUNTER — Other Ambulatory Visit: Payer: Self-pay | Admitting: Family Medicine

## 2014-06-08 NOTE — Telephone Encounter (Signed)
Last office visit 04/16/2014.  Last refilled 06/19/2013 for #90 with 3 refills. Ok to refill?

## 2014-07-10 ENCOUNTER — Encounter: Payer: Medicare Other | Attending: Physical Medicine & Rehabilitation | Admitting: Registered Nurse

## 2014-07-10 ENCOUNTER — Encounter: Payer: Self-pay | Admitting: Registered Nurse

## 2014-07-10 VITALS — BP 141/71 | HR 60

## 2014-07-10 DIAGNOSIS — M545 Low back pain, unspecified: Secondary | ICD-10-CM

## 2014-07-10 DIAGNOSIS — M961 Postlaminectomy syndrome, not elsewhere classified: Secondary | ICD-10-CM | POA: Diagnosis not present

## 2014-07-10 DIAGNOSIS — M5135 Other intervertebral disc degeneration, thoracolumbar region: Secondary | ICD-10-CM | POA: Diagnosis not present

## 2014-07-10 DIAGNOSIS — Z5181 Encounter for therapeutic drug level monitoring: Secondary | ICD-10-CM | POA: Diagnosis not present

## 2014-07-10 DIAGNOSIS — Z79899 Other long term (current) drug therapy: Secondary | ICD-10-CM | POA: Diagnosis not present

## 2014-07-10 DIAGNOSIS — G894 Chronic pain syndrome: Secondary | ICD-10-CM | POA: Diagnosis not present

## 2014-07-10 DIAGNOSIS — M797 Fibromyalgia: Secondary | ICD-10-CM

## 2014-07-10 DIAGNOSIS — IMO0002 Reserved for concepts with insufficient information to code with codable children: Secondary | ICD-10-CM

## 2014-07-10 MED ORDER — MORPHINE SULFATE ER 15 MG PO TBCR: 15.0000 mg | EXTENDED_RELEASE_TABLET | Freq: Three times a day (TID) | ORAL | Status: DC

## 2014-07-10 NOTE — Progress Notes (Signed)
Subjective:    Patient ID: [name], female    DOB: October 04, 1950, 64 y.o.   MRN: [id number]  HPI: [name] is a 64 year old female who returns for follow up for chronic pain and medication refill. She says her pain is located in her neck and mid-back.She rates her pain 5. Her current exercise regime is performing stretching exercises and walking. She is walking 4-5 miles every other day.   Pain Inventory Average Pain 5 Pain Right Now 5 My pain is constant, burning and aching  In the last 24 hours, has pain interfered with the following? General activity 7 Relation with others 7 Enjoyment of life 6 What TIME of day is your pain at its worst? evening and night Sleep (in general) Fair  Pain is worse with: walking, bending, sitting, inactivity, standing and some activites Pain improves with: rest, heat/ice, therapy/exercise, pacing activities, medication, TENS and injections Relief from Meds: 6  Mobility walk without assistance how many minutes can you walk? 10-15 ability to climb steps?  yes do you drive?  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  no  Physicians involved in your care Any changes since last visit?  no   Family History  Problem Relation Age of Onset  . Breast cancer Mother   . Diabetes Mother   . Heart disease Mother   . Diabetes Father   . Heart disease Father   . Stomach cancer Maternal Uncle   . Breast cancer Paternal Aunt   . Colon cancer Neg Hx    History   Social History  . Marital Status: Married    Spouse Name: N/A    Number of Children: N/A  . Years of Education: N/A   Social History Main Topics  . Smoking status: Never Smoker   . Smokeless tobacco: Never Used  .  Alcohol Use: No  . Drug Use: No  . Sexual Activity: None   Other Topics Concern  . None   Social History Narrative   Past Surgical History  Procedure Laterality Date  . Appendectomy    . Cholecystectomy    . Tubal ligation    . Tonsillectomy and adenoidectomy    . Anterior cervical decomp/discectomy fusion  2009    first procedure in ~ 1993/Dr Joya Salm   Past Medical History  Diagnosis Date  . GERD (gastroesophageal reflux disease)   . Unspecified essential hypertension   . Fibromyalgia   . B12 deficiency   . Depression   . Irritable bowel syndrome   . Hyperlipemia   . Migraines   . HYPOTHYROIDISM 05/29/2007  . Type II or unspecified type diabetes mellitus with neurological manifestations, not stated as uncontrolled 04/07/2013  . Generalized hyperhidrosis 04/17/2014   There were no vitals taken for this visit.  Opioid Risk Score:   Fall Risk Score: Moderate Fall Risk (6-13 points) (previoulsy educated and given handout) Review of Systems  Cardiovascular: Positive for leg swelling.  Genitourinary:       Bladder control problems  Neurological: Positive for weakness.  Psychiatric/Behavioral: Positive for dysphoric mood.  All other systems reviewed and are negative.      Objective:   Physical Exam  Constitutional: She is oriented to person, place, and time. She appears well-developed  and well-nourished.  HENT:  Head: Normocephalic and atraumatic.  Neck: Normal range of motion. Neck supple.  Cervical Paraspinal tenderness: C-3- C-5  Cardiovascular: Normal rate and regular rhythm.   Pulmonary/Chest: Effort normal and breath sounds normal.  Musculoskeletal:  Normal Muscle Bulk and Muscle testing Reveals: Upper Extremities: Full ROM and Muscle strength 5/5 Spinal Forward Flexion 90 Degrees and Extension 20 Degrees Back without spinal or Paraspinal Tenderness Lower Extremities: Full ROM and Muscle strength 5/5 Arises from chair with ease Narrow based gait    Neurological: She is alert and oriented to person, place, and time.  Skin: Skin is warm and dry.  Psychiatric: She has a normal mood and affect.  Nursing note and vitals reviewed.         Assessment & Plan:  1. Cervical postlaminectomy syndrome: with neck and shoulder pain. Continue exercise routine and using heat therapy. Continue MS Contin 15 mg one tablet three times a day #90.  2. Fibromyalgia: Continue with activity and exercise regime.  15 minutes of face to face patient care time was spent during this visit. All questions were encouraged and answered.   F/U in 1 month

## 2014-08-06 ENCOUNTER — Other Ambulatory Visit: Payer: Self-pay | Admitting: Physical Medicine & Rehabilitation

## 2014-08-06 ENCOUNTER — Ambulatory Visit (INDEPENDENT_AMBULATORY_CARE_PROVIDER_SITE_OTHER): Payer: Medicare Other | Admitting: Family Medicine

## 2014-08-06 ENCOUNTER — Encounter: Payer: Self-pay | Admitting: Family Medicine

## 2014-08-06 ENCOUNTER — Ambulatory Visit (INDEPENDENT_AMBULATORY_CARE_PROVIDER_SITE_OTHER)
Admission: RE | Admit: 2014-08-06 | Discharge: 2014-08-06 | Disposition: A | Discharge status: Home / Self Care | Payer: Medicare Other | Source: Ambulatory Visit | Attending: Family Medicine | Admitting: Family Medicine

## 2014-08-06 VITALS — BP 130/62 | HR 68 | Temp 98.6°F | Ht 65.0 in | Wt 228.2 lb

## 2014-08-06 DIAGNOSIS — R591 Generalized enlarged lymph nodes: Secondary | ICD-10-CM | POA: Diagnosis not present

## 2014-08-06 DIAGNOSIS — M25562 Pain in left knee: Secondary | ICD-10-CM

## 2014-08-06 DIAGNOSIS — M1712 Unilateral primary osteoarthritis, left knee: Secondary | ICD-10-CM | POA: Diagnosis not present

## 2014-08-06 DIAGNOSIS — M25462 Effusion, left knee: Secondary | ICD-10-CM

## 2014-08-06 DIAGNOSIS — R599 Enlarged lymph nodes, unspecified: Secondary | ICD-10-CM

## 2014-08-06 MED ORDER — OXYBUTYNIN CHLORIDE ER 10 MG PO TB24: 10.0000 mg | ORAL_TABLET | Freq: Every day | ORAL | Status: DC

## 2014-08-06 MED ORDER — CEPHALEXIN 500 MG PO CAPS: 1000.0000 mg | ORAL_CAPSULE | Freq: Two times a day (BID) | ORAL | Status: DC

## 2014-08-06 MED ORDER — DICLOFENAC SODIUM 1 % TD GEL: 4.0000 g | Freq: Four times a day (QID) | TRANSDERMAL | Status: DC

## 2014-08-06 NOTE — Progress Notes (Signed)
Pre visit review using our clinic review tool, if applicable. No additional management support is needed unless otherwise documented below in the visit note. 

## 2014-08-06 NOTE — Progress Notes (Signed)
Dr. Frederico Hamman T. Westen Dinino, MD, [facility] [place], [address] Phone: [phone] Fax: [phone]  08/06/2014  Patient: [name], MRN: [id number], DOB: 27-Nov-1950, 64 y.o.  Primary Physician:  Owens Loffler, MD  Chief Complaint: Knee Pain and Recurrent Skin Infections  Subjective:   [name] is a 64 y.o. very pleasant female patient who presents with the following:  Pleasant patient who presents with a primary complaint of left knee pain. She has had some recurrent effusions and pain walking and antalgia. She does not have any specific injury that she can recall. No trauma or injury that she knows of. She has been taking some over-the-counter ibuprofen without much significant success.  She has been having some intermittently recurrent boils off and on for a long time. Now she has a persistent lymph node in the periareolar region. It is mildly tender to palpation.  She also has been taking some oxybutynin for hyperhidrosis, and this is helped quite a bit.  L knee  L lymph node, skin  Past Medical History, Surgical History, Social History, Family History, Problem List, Medications, and Allergies have been reviewed and updated if relevant.  GEN: No fevers, chills. Nontoxic. Primarily MSK c/o today. MSK: Detailed in the HPI GI: tolerating PO intake without difficulty Neuro: No numbness, parasthesias, or tingling associated. Otherwise the pertinent positives of the ROS are noted above.   Objective:   BP 130/62 mmHg  Pulse 68  Temp(Src) 98.6 F (37 C) (Oral)  Ht _0  (1.651 m)  Wt 228 lb 4 oz (103.534 kg)  BMI 37.98 kg/m2   GEN: WDWN, NAD, Non-toxic, Alert & Oriented x 3 HEENT: Atraumatic, Normocephalic.  Ears and Nose: No external deformity. EXTR: No clubbing/cyanosis/edema PSYCH: Normally interactive. Conversant. Not depressed or anxious appearing.  Calm demeanor.   Knee:  L Gait: Normal heel  toe pattern, mild antalgia ROM: 0-90 Effusion: moderate Echymosis or edema: none Patellar tendon NT Painful PLICA: neg Patellar grind: negative Medial and lateral patellar facet loading: negative medial and lateral joint lines:NT Given size of effusion, specific meniscal testing is equivocal. Varus and valgus stress: stable Lachman: neg Ant and Post drawer: neg Hip abduction, IR, ER: WNL Hip flexion str: 5/5 Hip abd: 5/5 Quad: 5/5 VMO atrophy:No Hamstring concentric and eccentric: 5/5   Radiology: X-rays: AP Weight-bearing, Weightbearing Lateral, Sunrise views Indication: knee pain Findings:  There is a mild amount of joint space loss in the medial compartment, and to a lesser degree in the patellofemoral and lateral compartments. Consistent with mild tricompartmental arthritis greatest in the medial compartment. Electronically Signed  By: Owens Loffler, MD On: 08/06/2014 1:58 PM   Assessment and Plan:   Left knee pain - Plan: DG Knee AP/LAT W/Sunrise Left  Knee effusion, left [M25.462]  Primary osteoarthritis of left knee  Enlarged lymph nodes  Most likely degenerative meniscal tear. Conservative care. Continue ibuprofen. Voltaren gel up to 4 times daily. Ice.  Aspirate and inject the patient's knee with corticosteroid.  For her enlarged lymph node, we will do a trial some oral antibiotics to see if this helps.  Also refilled the patient's oxybutynin.  Knee Aspiration and Injection, LEFT Patient verbally consented; risks, benefits, and alternatives explained including possible infection. Patient prepped with Chloraprep. Ethyl chloride for anesthesia. 10 cc of 1% Lidocaine used in wheal then injected Subcutaneous fashion with 22 gauge needle on lateral approach. Under sterilne conditions, 18 gauge needle used via lateral  approach to aspirate 20 cc of yellow synovial fluid. Then 8 cc of Lidocaine 1% and Depo-Medrol 80 mg injected. Tolerated well, decreased pain, no  complications.   Follow-up: 3-4 weeks if no improvement  New Prescriptions   CEPHALEXIN (KEFLEX) 500 MG CAPSULE    Take 2 capsules (1,000 mg total) by mouth 2 (two) times daily.   DICLOFENAC SODIUM (VOLTAREN) 1 % GEL    Apply 4 g topically 4 (four) times daily.   Orders Placed This Encounter  Procedures  . DG Knee AP/LAT W/Sunrise Left    Signed,  Estiben Mizuno T. Mechel Haggard, MD   Patient's Medications  New Prescriptions   CEPHALEXIN (KEFLEX) 500 MG CAPSULE    Take 2 capsules (1,000 mg total) by mouth 2 (two) times daily.   DICLOFENAC SODIUM (VOLTAREN) 1 % GEL    Apply 4 g topically 4 (four) times daily.  Previous Medications   ASCORBIC ACID (VITAMIN C) 1000 MG TABLET    Take 1,000 mg by mouth 2 (two) times daily.   ASPIRIN 81 MG TABLET    Take 81 mg by mouth daily.     B COMPLEX VITAMINS (B COMPLEX 100 PO)    Take 1 tablet by mouth daily.   CALCIUM CARBONATE (CALCIUM 600 PO)    Take by mouth 2 (two) times daily.    CETIRIZINE (ZYRTEC) 10 MG TABLET    Take 10 mg by mouth daily.     CHOLECALCIFEROL (VITAMIN D) 2000 UNITS TABLET    Take 2,000 Units by mouth daily.     CITALOPRAM (CELEXA) 40 MG TABLET    TAKE 1 TABLET BY MOUTH DAILY   CLINDAMYCIN (CLEOCIN) 300 MG CAPSULE    Take 300 mg by mouth. before dental appointments   COENZYME Q-10 100 MG CAPSULE    Take 100 mg by mouth daily.   DOCUSATE SODIUM (COLACE) 100 MG CAPSULE    Take 100 mg by mouth at bedtime.   GABAPENTIN (NEURONTIN) 600 MG TABLET    Take 1 tablet (600 mg total) by mouth 3 (three) times daily.   GLUCOSE MONITORING KIT (FREESTYLE) MONITORING KIT    1 each by Does not apply route as needed for other. DISPENSE CHEAPEST KIT, PER PATIENT INSURANCE, BID TESTING, DX 250.00   LEVOTHYROXINE (SYNTHROID, LEVOTHROID) 50 MCG TABLET    TAKE 1 TABLET BY MOUTH DAILY   LORATADINE (CLARITIN) 10 MG TABLET    Take 10 mg by mouth daily.   MELATON-THEAN-CHAM-PASSF-LBALM (MELATONIN + L-THEANINE PO)    Take by mouth at bedtime. 62m/25mg tablet     METFORMIN (GLUCOPHAGE) 500 MG TABLET    TAKE 1 TABLET BY MOUTH TWICE A DAY WITH A MEAL   METHOCARBAMOL (ROBAXIN) 500 MG TABLET    Take 1 tablet (500 mg total) by mouth every 8 (eight) hours as needed for muscle spasms.   MIRTAZAPINE (REMERON) 15 MG TABLET    TAKE ONE TABLET BY MOUTH EVERY NIGHT AT BEDTIME   MORPHINE (MS CONTIN) 15 MG 12 HR TABLET    Take 1 tablet (15 mg total) by mouth 3 (three) times daily.   RABEPRAZOLE (ACIPHEX) 20 MG TABLET    TAKE 1 TABLET BY MOUTH DAILY   SENNA-DOCUSATE (PERI-COLACE) 8.6-50 MG PER TABLET    Take 2 tablets by mouth at bedtime as needed for mild constipation.   Modified Medications   Modified Medication Previous Medication   OXYBUTYNIN (DITROPAN-XL) 10 MG 24 HR TABLET oxybutynin (DITROPAN-XL) 10 MG 24 hr tablet  Take 1 tablet (10 mg total) by mouth at bedtime.    Take 1 tablet (10 mg total) by mouth at bedtime.  Discontinued Medications   No medications on file

## 2014-08-14 ENCOUNTER — Other Ambulatory Visit: Payer: Self-pay | Admitting: Physical Medicine & Rehabilitation

## 2014-08-14 ENCOUNTER — Encounter: Payer: Medicare Other | Attending: Physical Medicine & Rehabilitation | Admitting: Registered Nurse

## 2014-08-14 ENCOUNTER — Encounter: Payer: Self-pay | Admitting: Registered Nurse

## 2014-08-14 VITALS — BP 139/52 | HR 67 | Resp 14

## 2014-08-14 DIAGNOSIS — G894 Chronic pain syndrome: Secondary | ICD-10-CM | POA: Insufficient documentation

## 2014-08-14 DIAGNOSIS — Z79899 Other long term (current) drug therapy: Secondary | ICD-10-CM | POA: Diagnosis not present

## 2014-08-14 DIAGNOSIS — IMO0002 Reserved for concepts with insufficient information to code with codable children: Secondary | ICD-10-CM

## 2014-08-14 DIAGNOSIS — Z5181 Encounter for therapeutic drug level monitoring: Secondary | ICD-10-CM | POA: Diagnosis not present

## 2014-08-14 DIAGNOSIS — M961 Postlaminectomy syndrome, not elsewhere classified: Secondary | ICD-10-CM

## 2014-08-14 DIAGNOSIS — M25562 Pain in left knee: Secondary | ICD-10-CM

## 2014-08-14 DIAGNOSIS — M797 Fibromyalgia: Secondary | ICD-10-CM

## 2014-08-14 DIAGNOSIS — M5135 Other intervertebral disc degeneration, thoracolumbar region: Secondary | ICD-10-CM | POA: Diagnosis not present

## 2014-08-14 MED ORDER — MORPHINE SULFATE ER 15 MG PO TBCR: 15.0000 mg | EXTENDED_RELEASE_TABLET | Freq: Three times a day (TID) | ORAL | Status: DC

## 2014-08-14 NOTE — Progress Notes (Signed)
Subjective:    Patient ID: [name], female    DOB: Nov 09, 1950, 64 y.o.   MRN: [id number]  HPI: [name] is a 64 year old female who returns for follow up for chronic pain and medication refill. She says her pain is located in her right shoulder, mid-back and left knee.She rates her pain 4. Her current exercise regime is performing stretching exercises and walking. She is walking 4-5 miles every other day.  Also stated last week her PCP withdrew fluid from her left knee and she received a cortisone injection.   Pain Inventory Average Pain 4 Pain Right Now 4 My pain is constant, burning and aching  In the last 24 hours, has pain interfered with the following? General activity 4 Relation with others 5 Enjoyment of life 5 What TIME of day is your pain at its worst? morning,night  Sleep (in general) Fair  Pain is worse with: walking, bending, sitting, inactivity, standing and some activites Pain improves with: rest, heat/ice, therapy/exercise, pacing activities, medication, TENS and injections Relief from Meds: 6  Mobility walk without assistance how many minutes can you walk? 10-15 ability to climb steps?  yes do you drive?  yes Do you have any goals in this area?  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  yes  Neuro/Psych bladder control problems weakness numbness tremor tingling spasms depression  Prior Studies Any changes since last visit?  no  Physicians involved in your care Any changes since last visit?  no   Family History  Problem Relation Age of Onset  . Breast cancer Mother   . Diabetes Mother   . Heart disease Mother   . Diabetes Father   . Heart disease Father   . Stomach cancer Maternal Uncle   . Breast cancer Paternal Aunt   . Colon cancer Neg Hx     History   Social History  . Marital Status: Married    Spouse Name: N/A  . Number of Children: N/A  . Years of Education: N/A   Social History Main Topics  . Smoking status: Never Smoker   . Smokeless tobacco: Never Used  . Alcohol Use: No  . Drug Use: No  . Sexual Activity: Not on file   Other Topics Concern  . None   Social History Narrative   Past Surgical History  Procedure Laterality Date  . Appendectomy    . Cholecystectomy    . Tubal ligation    . Tonsillectomy and adenoidectomy    . Anterior cervical decomp/discectomy fusion  2009    first procedure in ~ 1993/Dr Joya Salm   Past Medical History  Diagnosis Date  . GERD (gastroesophageal reflux disease)   . Unspecified essential hypertension   . Fibromyalgia   . B12 deficiency   . Depression   . Irritable bowel syndrome   . Hyperlipemia   . Migraines   . HYPOTHYROIDISM 05/29/2007  . Type II or unspecified type diabetes mellitus with neurological manifestations, not stated as uncontrolled 04/07/2013  . Generalized hyperhidrosis 04/17/2014   BP 139/52 mmHg  Pulse 67  Resp 14  SpO2 96%  Opioid Risk Score:   Fall Risk Score: Low Fall Risk (0-5 points)  Review of Systems  Constitutional:       Weight gain  Cardiovascular: Positive for  leg swelling.  Gastrointestinal: Positive for constipation.  Neurological: Positive for tremors, weakness and numbness.       Tingling Spasms   Psychiatric/Behavioral: Positive for dysphoric mood.  All other systems reviewed and are negative.      Objective:   Physical Exam  Constitutional: She is oriented to person, place, and time. She appears well-developed and well-nourished.  HENT:  Head: Normocephalic and atraumatic.  Neck: Normal range of motion. Neck supple.  Cardiovascular: Normal rate and regular rhythm.   Pulmonary/Chest: Effort normal and breath sounds normal.  Musculoskeletal:  Normal Muscle Bulk and Muscle Testing Reveals: Upper Extremities: Full  ROM and Muscle Strength 5/5  Thoracic Paraspinal Tenderness: T-4- T-7 Lower Extremities: Full ROM and Muscle Strength 5/5 Wearing Left Knee copper tone sleeve No Pain with Flexion/ Left Patella edematous Arises from chair with ease Narrow Based Gait  Neurological: She is alert and oriented to person, place, and time.  Skin: Skin is warm and dry.  Psychiatric: She has a normal mood and affect.  Nursing note and vitals reviewed.         Assessment & Plan:  1. Cervical postlaminectomy syndrome: with neck and shoulder pain. Continue exercise routine and using heat therapy. Continue MS Contin 15 mg one tablet three times a day #90.  2. Fibromyalgia: Continue with activity and exercise regime.  15 minutes of face to face patient care time was spent during this visit. All questions were encouraged and answered.   F/U in 1 month

## 2014-08-15 LAB — PMP ALCOHOL METABOLITE (ETG): Ethyl Glucuronide (EtG): NEGATIVE ng/mL

## 2014-08-19 LAB — PRESCRIPTION MONITORING PROFILE (SOLSTAS)
Amphetamine/Meth: NEGATIVE ng/mL
Barbiturate Screen, Urine: NEGATIVE ng/mL
Benzodiazepine Screen, Urine: NEGATIVE ng/mL
Buprenorphine, Urine: NEGATIVE ng/mL
Cannabinoid Scrn, Ur: NEGATIVE ng/mL
Carisoprodol, Urine: NEGATIVE ng/mL
Cocaine Metabolites: NEGATIVE ng/mL
Creatinine, Urine: 31.29 mg/dL (ref >20.0)
Fentanyl, Ur: NEGATIVE ng/mL
MDMA URINE: NEGATIVE ng/mL
Meperidine, Ur: NEGATIVE ng/mL
Methadone Screen, Urine: NEGATIVE ng/mL
Nitrites, Initial: NEGATIVE ug/mL
Oxycodone Screen, Ur: NEGATIVE ng/mL
Propoxyphene: NEGATIVE ng/mL
Tapentadol, urine: NEGATIVE ng/mL
Tramadol Scrn, Ur: NEGATIVE ng/mL
Zolpidem, Urine: NEGATIVE ng/mL
pH, Initial: 6.3 pH (ref 4.5–8.9)

## 2014-08-19 LAB — OPIATES/OPIOIDS (LC/MS-MS)
Codeine Urine: NEGATIVE ng/mL (ref <50)
Hydrocodone: NEGATIVE ng/mL (ref <50)
Hydromorphone: 61 ng/mL (ref <50)
Morphine Urine: 8449 ng/mL (ref <50)
Norhydrocodone, Ur: NEGATIVE ng/mL (ref <50)
Noroxycodone, Ur: NEGATIVE ng/mL (ref <50)
Oxycodone, ur: NEGATIVE ng/mL (ref <50)
Oxymorphone: NEGATIVE ng/mL (ref <50)

## 2014-08-25 ENCOUNTER — Other Ambulatory Visit: Payer: Self-pay | Admitting: Family Medicine

## 2014-08-27 ENCOUNTER — Ambulatory Visit (INDEPENDENT_AMBULATORY_CARE_PROVIDER_SITE_OTHER): Payer: Medicare Other | Admitting: Family Medicine

## 2014-08-27 ENCOUNTER — Encounter: Payer: Self-pay | Admitting: Family Medicine

## 2014-08-27 VITALS — BP 120/64 | HR 71 | Temp 98.6°F | Ht 65.0 in | Wt 229.2 lb

## 2014-08-27 DIAGNOSIS — R591 Generalized enlarged lymph nodes: Secondary | ICD-10-CM | POA: Diagnosis not present

## 2014-08-27 DIAGNOSIS — R599 Enlarged lymph nodes, unspecified: Secondary | ICD-10-CM

## 2014-08-27 NOTE — Progress Notes (Signed)
Dr. Frederico Hamman T. Galina Haddox, MD, [facility] [place], [address] Phone: [phone] Fax: [phone]  08/27/2014  Patient: [name], MRN: [id number], DOB: Jun 01, 1951, 64 y.o.  Primary Physician:  Owens Loffler, MD  Chief Complaint: Knot Behind Left Ear  Subjective:   [name] is a 64 y.o. very pleasant female patient who presents with the following:   pleasant patient with fibromyalgia, type 2 diabetes, chronic pain syndrome and she presents with persistent left-sided auricular  Lymph node. I saw her approximately one month ago, at that point I placed her on some Keflex. This did not alter the node. It is mildly tender, and she does not think that it is increased in size.  Past Medical History, Surgical History, Social History, Family History, Problem List, Medications, and Allergies have been reviewed and updated if relevant.   GEN: No acute illnesses, no fevers, chills. GI: No n/v/d, eating normally Pulm: No SOB Interactive and getting along well at home.  Otherwise, ROS is as per the HPI.  Objective:   BP 120/64 mmHg  Pulse 71  Temp(Src) 98.6 F (37 C) (Oral)  Ht 5' 5"  (1.651 m)  Wt 229 lb 4 oz (103.987 kg)  BMI 38.15 kg/m2  GEN: WDWN, NAD, Non-toxic, A & O x 3 HEENT: Atraumatic, Normocephalic. Neck supple. No masses, No LAD. Ears and Nose: No external deformity. TM clear. L posterior node behind ear enlarged and mobile. CV: RRR, No M/G/R. No JVD. No thrill. No extra heart sounds. PULM: CTA B, no wheezes, crackles, rhonchi. No retractions. No resp. distress. No accessory muscle use. EXTR: No c/c/e NEURO Normal gait.  PSYCH: Normally interactive. Conversant. Not depressed or anxious appearing.  Calm demeanor.   Laboratory and Imaging Data: CBC:    Component Value Date/Time   WBC 8.3 01/13/2014 1037   HGB 12.3 01/13/2014 1037   HCT 36.5 01/13/2014 1037   PLT 257.0 01/13/2014 1037   MCV 90.5  01/13/2014 1037   NEUTROABS 5.3 01/13/2014 1037   LYMPHSABS 2.3 01/13/2014 1037   MONOABS 0.4 01/13/2014 1037   EOSABS 0.2 01/13/2014 1037   BASOSABS 0.0 01/13/2014 1037    Comprehensive Metabolic Panel:    Component Value Date/Time   NA 139 01/13/2014 1037   K 4.2 01/13/2014 1037   CL 101 01/13/2014 1037   CO2 29 01/13/2014 1037   BUN 21 01/13/2014 1037   CREATININE 0.8 01/13/2014 1037   GLUCOSE 91 01/13/2014 1037   CALCIUM 9.2 01/13/2014 1037   AST 27 01/13/2014 1037   ALT 26 01/13/2014 1037   ALKPHOS 88 01/13/2014 1037   BILITOT 0.6 01/13/2014 1037   PROT 7.1 01/13/2014 1037   ALBUMIN 3.7 01/13/2014 1037      Assessment and Plan:   Enlarged lymph nodes - Plan: Ambulatory referral to ENT  3 months enlarged lymph node, consult ENT for eval and recs.  Follow-up: No Follow-up on file.  New Prescriptions   No medications on file   Orders Placed This Encounter  Procedures  . Ambulatory referral to ENT    Signed,  Frederico Hamman T. Britain Saber, MD   Patient's Medications  New Prescriptions   No medications on file  Previous Medications   ASCORBIC ACID (VITAMIN C) 1000 MG TABLET    Take 1,000 mg by mouth 2 (two) times daily.   ASPIRIN 81 MG TABLET    Take 81 mg by mouth daily.     B COMPLEX  VITAMINS (B COMPLEX 100 PO)    Take 1 tablet by mouth daily.   CALCIUM CARBONATE (CALCIUM 600 PO)    Take 600 mg by mouth 2 (two) times daily.    CETIRIZINE (ZYRTEC) 10 MG TABLET    Take 10 mg by mouth daily.     CHOLECALCIFEROL (VITAMIN D) 2000 UNITS TABLET    Take 2,000 Units by mouth daily.     CITALOPRAM (CELEXA) 40 MG TABLET    TAKE 1 TABLET BY MOUTH DAILY   CLINDAMYCIN (CLEOCIN) 300 MG CAPSULE    Take 300 mg by mouth. before dental appointments   COENZYME Q-10 100 MG CAPSULE    Take 100 mg by mouth daily.   DICLOFENAC SODIUM (VOLTAREN) 1 % GEL    Apply 4 g topically 4 (four) times daily.   DOCUSATE SODIUM (COLACE) 100 MG CAPSULE    Take 100 mg by mouth at bedtime.   GABAPENTIN  (NEURONTIN) 600 MG TABLET    TAKE 1 TABLET BY MOUTH THREE TIMES A DAY   GLUCOSE MONITORING KIT (FREESTYLE) MONITORING KIT    1 each by Does not apply route as needed for other. DISPENSE CHEAPEST KIT, PER PATIENT INSURANCE, BID TESTING, DX 250.00   LEVOTHYROXINE (SYNTHROID, LEVOTHROID) 50 MCG TABLET    TAKE 1 TABLET BY MOUTH DAILY   LORATADINE (CLARITIN) 10 MG TABLET    Take 10 mg by mouth daily.   MELATON-THEAN-CHAM-PASSF-LBALM (MELATONIN + L-THEANINE PO)    Take by mouth at bedtime. 22m/25mg tablet    METFORMIN (GLUCOPHAGE) 500 MG TABLET    TAKE 1 TABLET BY MOUTH TWICE A DAY WITH A MEAL   METHOCARBAMOL (ROBAXIN) 500 MG TABLET    TAKE ONE TABLET EVERY 8 HOURS AS NEEDED FOR MUSCLE SPASMS   MIRTAZAPINE (REMERON) 15 MG TABLET    TAKE ONE TABLET BY MOUTH EVERY NIGHT AT BEDTIME   MORPHINE (MS CONTIN) 15 MG 12 HR TABLET    Take 1 tablet (15 mg total) by mouth 3 (three) times daily.   OXYBUTYNIN (DITROPAN-XL) 10 MG 24 HR TABLET    Take 1 tablet (10 mg total) by mouth at bedtime.   RABEPRAZOLE (ACIPHEX) 20 MG TABLET    TAKE 1 TABLET BY MOUTH DAILY   SENNA-DOCUSATE (PERI-COLACE) 8.6-50 MG PER TABLET    Take 2 tablets by mouth at bedtime as needed for mild constipation.   Modified Medications   No medications on file  Discontinued Medications   CEPHALEXIN (KEFLEX) 500 MG CAPSULE    Take 2 capsules (1,000 mg total) by mouth 2 (two) times daily.

## 2014-08-27 NOTE — Progress Notes (Signed)
Pre visit review using our clinic review tool, if applicable. No additional management support is needed unless otherwise documented below in the visit note. 

## 2014-08-27 NOTE — Patient Instructions (Signed)
REFERRALS TO SPECIALISTS, SPECIAL TESTS (MRI, CT, ULTRASOUNDS)  [facility] or [facility] will help you. ASK CHECK-IN FOR HELP.  Imaging / Special Testing referrals sometimes can be done same day if EMERGENCY, but others can take 2 or 3 days to get an appointment. Starting in 2015, many of the new Medicare plans and Obamacare plans take much longer.   Specialist appointment times vary a great deal, based on their schedule / openings. -- Some specialists have very long wait times. (Example. Dermatology. Multiple months  for non-cancer)   

## 2014-08-29 NOTE — Telephone Encounter (Signed)
error 

## 2014-08-29 NOTE — Progress Notes (Signed)
Urine drug screen for this encounter is consistent for prescribed medication 

## 2014-09-03 DIAGNOSIS — M542 Cervicalgia: Secondary | ICD-10-CM | POA: Diagnosis not present

## 2014-09-05 ENCOUNTER — Encounter: Payer: Self-pay | Admitting: *Deleted

## 2014-09-11 ENCOUNTER — Encounter: Payer: Self-pay | Admitting: Registered Nurse

## 2014-09-11 ENCOUNTER — Encounter: Payer: Medicare Other | Attending: Physical Medicine & Rehabilitation | Admitting: Registered Nurse

## 2014-09-11 VITALS — BP 126/56 | HR 64 | Resp 14

## 2014-09-11 DIAGNOSIS — M797 Fibromyalgia: Secondary | ICD-10-CM | POA: Diagnosis not present

## 2014-09-11 DIAGNOSIS — M5135 Other intervertebral disc degeneration, thoracolumbar region: Secondary | ICD-10-CM

## 2014-09-11 DIAGNOSIS — G894 Chronic pain syndrome: Secondary | ICD-10-CM | POA: Diagnosis not present

## 2014-09-11 DIAGNOSIS — M961 Postlaminectomy syndrome, not elsewhere classified: Secondary | ICD-10-CM

## 2014-09-11 DIAGNOSIS — IMO0002 Reserved for concepts with insufficient information to code with codable children: Secondary | ICD-10-CM

## 2014-09-11 DIAGNOSIS — Z79899 Other long term (current) drug therapy: Secondary | ICD-10-CM | POA: Diagnosis not present

## 2014-09-11 DIAGNOSIS — Z5181 Encounter for therapeutic drug level monitoring: Secondary | ICD-10-CM

## 2014-09-11 MED ORDER — MORPHINE SULFATE ER 15 MG PO TBCR: 15.0000 mg | EXTENDED_RELEASE_TABLET | Freq: Three times a day (TID) | ORAL | Status: DC

## 2014-09-11 NOTE — Progress Notes (Signed)
Subjective:    Patient ID: [name], female    DOB: 1951-04-12, 64 y.o.   MRN: [id number]  HPI: [name] is a 64 year old female who returns for follow up for chronic pain and medication refill. She says her pain is located in her neck and mid-back. Also having Right TMJ pain ENT and Dentist following.She rates her pain 4. Her current exercise regime is performing stretching exercises and walking. She is walking 4-5 miles every other day.   Pain Inventory Average Pain 4 Pain Right Now 4 My pain is constant, burning and aching  In the last 24 hours, has pain interfered with the following? General activity 6 Relation with others 6 Enjoyment of life 6 What TIME of day is your pain at its worst? evening, night Sleep (in general) Fair  Pain is worse with: walking, bending, sitting, inactivity, standing and some activites Pain improves with: rest, heat/ice, therapy/exercise, pacing activities, medication, TENS and injections Relief from Meds: 6  Mobility walk without assistance how many minutes can you walk? 15 ability to climb steps?  yes do you drive?  yes Do you have any goals in this area?  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 I need assistance with the following:  meal prep, household duties and shopping Do you have any goals in this area?  yes  Neuro/Psych bladder control problems weakness numbness tremor spasms dizziness depression  Prior Studies Any changes since last visit?  no  Physicians involved in your care Any changes since last visit?  no   Family History  Problem Relation Age of Onset  . Breast cancer Mother   . Diabetes Mother   . Heart disease Mother   . Diabetes Father   . Heart disease Father   . Stomach cancer Maternal Uncle   . Breast cancer Paternal Aunt   . Colon cancer Neg Hx    History   Social History  . Marital Status: Married    Spouse Name: N/A  .  Number of Children: N/A  . Years of Education: N/A   Social History Main Topics  . Smoking status: Never Smoker   . Smokeless tobacco: Never Used  . Alcohol Use: No  . Drug Use: No  . Sexual Activity: Not on file   Other Topics Concern  . None   Social History Narrative   Past Surgical History  Procedure Laterality Date  . Appendectomy    . Cholecystectomy    . Tubal ligation    . Tonsillectomy and adenoidectomy    . Anterior cervical decomp/discectomy fusion  2009    first procedure in ~ 1993/Dr Joya Salm   Past Medical History  Diagnosis Date  . GERD (gastroesophageal reflux disease)   . Unspecified essential hypertension   . Fibromyalgia   . B12 deficiency   . Depression   . Irritable bowel syndrome   . Hyperlipemia   . Migraines   . HYPOTHYROIDISM 05/29/2007  . Type II or unspecified type diabetes mellitus with neurological manifestations, not stated as uncontrolled 04/07/2013  . Generalized hyperhidrosis 04/17/2014   There were no vitals taken for this visit.  Opioid Risk Score:   Fall Risk Score: Low Fall Risk (0-5 points) (patient declined pamphlet during todays visit)  Review of Systems  Gastrointestinal: Positive for constipation.  Genitourinary: Positive for urgency.  Musculoskeletal: Positive for joint swelling.  Neurological: Positive for dizziness, tremors, weakness and numbness.  Spasms  Psychiatric/Behavioral: Positive for dysphoric mood.  All other systems reviewed and are negative.      Objective:   Physical Exam  Constitutional: She is oriented to person, place, and time. She appears well-developed and well-nourished.  HENT:  Head: Normocephalic and atraumatic.  Neck: Normal range of motion. Neck supple.  Cardiovascular: Normal rate and regular rhythm.   Pulmonary/Chest: Effort normal and breath sounds normal.  Musculoskeletal:  Normal Muscle Bulk and Muscle Testing Reveals: Upper Extremities: Full ROM and Muscle Strength  5/5 Thoracic Paraspinal Tenderness: T-5- T-8 Lower Extremities: Full ROM and Muscle Strength 5/5 Arises from chair with ease Narrow based Gait    Neurological: She is alert and oriented to person, place, and time.  Skin: Skin is warm and dry.  Psychiatric: She has a normal mood and affect.  Nursing note and vitals reviewed.         Assessment & Plan:  1. Cervical postlaminectomy syndrome: with neck and shoulder pain. Continue exercise routine and using heat therapy. Continue MS Contin 15 mg one tablet three times a day #90.  2. Fibromyalgia: Continue with activity and exercise regime.  15 minutes of face to face patient care time was spent during this visit. All questions were encouraged and answered.   F/U in 1 month

## 2014-09-13 ENCOUNTER — Other Ambulatory Visit: Payer: Self-pay | Admitting: Family Medicine

## 2014-09-16 ENCOUNTER — Telehealth: Payer: Self-pay | Admitting: *Deleted

## 2014-09-16 NOTE — Telephone Encounter (Signed)
Received fax from [facility] requesting PA for Volteran Gel.  PA completed on CoverMyMeds.

## 2014-09-16 NOTE — Telephone Encounter (Signed)
Voltaren 1% Gel approved through 09/16/2015.  [place] notified.

## 2014-09-25 ENCOUNTER — Telehealth: Payer: Self-pay | Admitting: *Deleted

## 2014-09-25 NOTE — Telephone Encounter (Signed)
Recd letter from [facility] stating that they will no long cover methocarbamol.  Suggesting Tizanidine, Meloxicam or Diflunisal or I can try to appeal it.  Please advise

## 2014-09-29 NOTE — Telephone Encounter (Signed)
May try tizanidine 2mg  TID prn #90 no RF

## 2014-09-29 NOTE — Telephone Encounter (Signed)
She has tried this tizanidine and it causes shortness of breath. I will make an appeal.

## 2014-10-10 ENCOUNTER — Encounter: Payer: Medicare Other | Attending: Physical Medicine & Rehabilitation

## 2014-10-10 ENCOUNTER — Ambulatory Visit (HOSPITAL_BASED_OUTPATIENT_CLINIC_OR_DEPARTMENT_OTHER): Payer: Medicare Other | Admitting: Physical Medicine & Rehabilitation

## 2014-10-10 ENCOUNTER — Encounter: Payer: Self-pay | Admitting: Physical Medicine & Rehabilitation

## 2014-10-10 DIAGNOSIS — Z5181 Encounter for therapeutic drug level monitoring: Secondary | ICD-10-CM | POA: Insufficient documentation

## 2014-10-10 DIAGNOSIS — G894 Chronic pain syndrome: Secondary | ICD-10-CM | POA: Diagnosis not present

## 2014-10-10 DIAGNOSIS — M961 Postlaminectomy syndrome, not elsewhere classified: Secondary | ICD-10-CM

## 2014-10-10 DIAGNOSIS — M25512 Pain in left shoulder: Secondary | ICD-10-CM

## 2014-10-10 DIAGNOSIS — M542 Cervicalgia: Secondary | ICD-10-CM | POA: Diagnosis not present

## 2014-10-10 DIAGNOSIS — Z79899 Other long term (current) drug therapy: Secondary | ICD-10-CM | POA: Insufficient documentation

## 2014-10-10 MED ORDER — MORPHINE SULFATE ER 15 MG PO TBCR: 15.0000 mg | EXTENDED_RELEASE_TABLET | Freq: Three times a day (TID) | ORAL | Status: DC

## 2014-10-10 NOTE — Progress Notes (Signed)
Subjective:    Patient ID: [name], female    DOB: [birth year], 64 y.o.   MRN: [id number]  HPI "TMJ"  Since Dec, saw dentist and ENT, no trigger point injection  But did have a Left shoulder inj which didn't help Mainly on Left side Some clicking with mouth opening Pain Inventory Average Pain 6 Pain Right Now 7 My pain is constant, burning and aching  In the last 24 hours, has pain interfered with the following? General activity 7 Relation with others 8 Enjoyment of life 7 What TIME of day is your pain at its worst? evening Sleep (in general) Fair  Pain is worse with: walking, bending, sitting, inactivity, standing and some activites Pain improves with: rest, heat/ice, therapy/exercise, pacing activities, medication, TENS and injections Relief from Meds: 6  Mobility walk without assistance how many minutes can you walk? 15-20 ability to climb steps?  yes do you drive?  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  no  Physicians involved in your care Any changes since last visit?  no   Family History  Problem Relation Age of Onset  . Breast cancer Mother   . Diabetes Mother   . Heart disease Mother   . Diabetes Father   . Heart disease Father   . Stomach cancer Maternal Uncle   . Breast cancer Paternal Aunt   . Colon cancer Neg Hx    History   Social History  . Marital Status: Married    Spouse Name: N/A  . Number of Children: N/A  . Years of Education: N/A   Social History Main Topics  . Smoking status: Never Smoker   . Smokeless tobacco: Never Used  . Alcohol Use: No  . Drug Use: No  . Sexual Activity: Not on file   Other Topics Concern  . None   Social History  Narrative   Past Surgical History  Procedure Laterality Date  . Appendectomy    . Cholecystectomy    . Tubal ligation    . Tonsillectomy and adenoidectomy    . Anterior cervical decomp/discectomy fusion  2009    first procedure in ~ 1993/Dr Joya Salm   Past Medical History  Diagnosis Date  . GERD (gastroesophageal reflux disease)   . Unspecified essential hypertension   . Fibromyalgia   . B12 deficiency   . Depression   . Irritable bowel syndrome   . Hyperlipemia   . Migraines   . HYPOTHYROIDISM 05/29/2007  . Type II or unspecified type diabetes mellitus with neurological manifestations, not stated as uncontrolled 04/07/2013  . Generalized hyperhidrosis 04/17/2014   BP 138/62 mmHg  Pulse 68  Resp 14  SpO2 97%  Opioid Risk Score:   Fall Risk Score: Moderate Fall Risk (6-13 points) (previously educated and given handout)`1  Depression screen PHQ 2/9  Depression screen [facility] 2/9 09/11/2014 10/15/2013  Decreased Interest 2 0  Down, Depressed, Hopeless 0 0  PHQ - 2 Score 2 0  Altered sleeping 0 -  Tired, decreased energy 2 -  Change in appetite 0 -  Feeling bad or failure about yourself  0 -  Trouble concentrating 1 -  Moving slowly or fidgety/restless 0 -  Suicidal thoughts 0 -  PHQ-9 Score 5 -  Review of Systems  Respiratory: Positive for cough.   Gastrointestinal: Positive for constipation.  Genitourinary:       Bladder control problems  Musculoskeletal:       Spasms  Neurological: Positive for tremors, weakness and numbness.       Tingling  Psychiatric/Behavioral: Positive for dysphoric mood.  All other systems reviewed and are negative.      Objective:   Physical Exam  Constitutional: She is oriented to person, place, and time. She appears well-developed and well-nourished.  HENT:  Head: Normocephalic and atraumatic.  Eyes: Pupils are equal, round, and reactive to light.  Neurological: She is alert and oriented to person, place, and time.    Psychiatric: She has a normal mood and affect.  Nursing note and vitals reviewed.   75% cervical flexion extension lateral rotation and lateral bending bilaterally.      Assessment & Plan:  1. Cervical postlaminectomy syndrome: with neck and shoulder pain. Continue exercise routine and using heat therapy. Continue MS Contin 15 mg one tablet three times a day #90.   2. Fibromyalgia: Continue with activity and exercise regime.  3. Myofascial pain left longissimus capitis as well as proximal left sternocleidomastoid This has been present for several months and has not responded to treatment either by her dentist or by ear nose and throat surgeon Recommend trial of trigger point injection and if this fails consider PT  Trigger Point Injection  Indication: Left cervical Myofascial pain not relieved by medication management and other conservative care.  Informed consent was obtained after describing risk and benefits of the procedure with the patient, this includes bleeding, bruising, infection and medication side effects.  The patient wishes to proceed and has given written consent.  The patient was placed in a Seated position.  The Left mastoid area was marked and prepped with Betadine.  It was entered with a 27-gauge 1/2 inch needle and 1 mL of 1% lidocaine was injected into each of 2 trigger points, 1 injection into the longissimus capitis, one injection into the proximal aspect of the sternocleidomastoid on the left side, after negative draw back for blood.  The patient tolerated the procedure well.  Post procedure instructions were given.   F/U in 1 month

## 2014-10-10 NOTE — Patient Instructions (Signed)
Trigger Point Injection Trigger points are areas where you have muscle pain. A trigger point injection is a shot given in the trigger point to relieve that pain. A trigger point might feel like a knot in your muscle. It hurts to press on a trigger point. Sometimes the pain spreads out (radiates) to other parts of the body. For example, pressing on a trigger point in your shoulder might cause pain in your arm or neck. You might have one trigger point. Or, you might have more than one. People often have trigger points in their upper back and lower back. They also occur often in the neck and shoulders. Pain from a trigger point lasts for a long time. It can make it hard to keep moving. You might not be able to do the exercise or physical therapy that could help you deal with the pain. A trigger point injection may help. It does not work for everyone. But, it may relieve your pain for a few days or a few months. A trigger point injection does not cure long-lasting (chronic) pain. LET YOUR CAREGIVER KNOW ABOUT:  Any allergies (especially to latex, lidocaine, or steroids).  Blood-thinning medicines that you take. These drugs can lead to bleeding or bruising after an injection. They include:  Aspirin.  Ibuprofen.  Clopidogrel.  Warfarin.  Other medicines you take. This includes all vitamins, herbs, eyedrops, over-the-counter medicines, and creams.  Use of steroids.  Recent infections.  Past problems with numbing medicines.  Bleeding problems.  Surgeries you have had.  Other health problems. RISKS AND COMPLICATIONS A trigger point injection is a safe treatment. However, problems may develop, such as:  Minor side effects usually go away in 1 to 2 days. These may include:  Soreness.  Bruising.  Stiffness.  More serious problems are rare. But, they may include:  Bleeding under the skin (hematoma).  Skin infection.  Breaking off of the needle under your skin.  Lung  puncture.  The trigger point injection may not work for you. BEFORE THE PROCEDURE You may need to stop taking any medicine that thins your blood. This is to prevent bleeding and bruising. Usually these medicines are stopped several days before the injection. No other preparation is needed. PROCEDURE  A trigger point injection can be given in your caregiver's office or in a clinic. Each injection takes 2 minutes or less.  Your caregiver will feel for trigger points. The caregiver may use a marker to circle the area for the injection.  The skin over the trigger point will be washed with a germ-killing (antiseptic) solution.  The caregiver pinches the spot for the injection.  Then, a very thin needle is used for the shot. You may feel pain or a twitching feeling when the needle enters the trigger point.  A numbing solution may be injected into the trigger point. Sometimes a drug to keep down swelling, redness, and warmth (inflammation) is also injected.  Your caregiver moves the needle around the trigger zone until the tightness and twitching goes away.  After the injection, your caregiver may put gentle pressure over the injection site.  Then it is covered with a bandage. AFTER THE PROCEDURE  You can go right home after the injection.  The bandage can be taken off after a few hours.  You may feel sore and stiff for 1 to 2 days.  Go back to your regular activities slowly. Your caregiver may ask you to stretch your muscles. Do not do anything that takes   extra energy for a few days.  Follow your caregiver's instructions to manage and treat other pain. Document Released: 06/02/2011 Document Revised: 10/08/2012 Document Reviewed: 06/02/2011 [facility] Patient Information 2015 [place], [place]. This information is not intended to replace advice given to you by your health care provider. Make sure you discuss any questions you have with your health care provider.

## 2014-10-20 ENCOUNTER — Encounter: Payer: Self-pay | Admitting: Family Medicine

## 2014-10-20 ENCOUNTER — Ambulatory Visit (INDEPENDENT_AMBULATORY_CARE_PROVIDER_SITE_OTHER): Payer: Medicare Other | Admitting: Family Medicine

## 2014-10-20 VITALS — BP 120/60 | HR 85 | Temp 99.2°F | Ht 65.0 in | Wt 223.5 lb

## 2014-10-20 DIAGNOSIS — G4733 Obstructive sleep apnea (adult) (pediatric): Secondary | ICD-10-CM | POA: Diagnosis not present

## 2014-10-20 DIAGNOSIS — I10 Essential (primary) hypertension: Secondary | ICD-10-CM

## 2014-10-20 DIAGNOSIS — E038 Other specified hypothyroidism: Secondary | ICD-10-CM | POA: Diagnosis not present

## 2014-10-20 DIAGNOSIS — L0293 Carbuncle, unspecified: Secondary | ICD-10-CM

## 2014-10-20 DIAGNOSIS — E1140 Type 2 diabetes mellitus with diabetic neuropathy, unspecified: Secondary | ICD-10-CM | POA: Diagnosis not present

## 2014-10-20 LAB — BASIC METABOLIC PANEL
BUN: 16 mg/dL (ref 6–23)
CO2: 32 mEq/L (ref 19–32)
Calcium: 9.8 mg/dL (ref 8.4–10.5)
Chloride: 102 mEq/L (ref 96–112)
Creatinine, Ser: 0.99 mg/dL (ref 0.40–1.20)
GFR: 60.22 mL/min (ref >60.00)
Glucose, Bld: 133 mg/dL — ABNORMAL HIGH (ref 70–99)
Potassium: 4.1 mEq/L (ref 3.5–5.1)
Sodium: 137 mEq/L (ref 135–145)

## 2014-10-20 LAB — TSH: TSH: 2.01 u[IU]/mL (ref 0.35–4.50)

## 2014-10-20 LAB — HEMOGLOBIN A1C: Hgb A1c MFr Bld: 6 % (ref 4.6–6.5)

## 2014-10-20 MED ORDER — OMEPRAZOLE 20 MG PO CPDR: 20.0000 mg | DELAYED_RELEASE_CAPSULE | Freq: Two times a day (BID) | ORAL | Status: DC

## 2014-10-20 MED ORDER — OXYBUTYNIN CHLORIDE ER 10 MG PO TB24: 10.0000 mg | ORAL_TABLET | Freq: Every day | ORAL | Status: DC

## 2014-10-20 NOTE — Progress Notes (Signed)
Pre visit review using our clinic review tool, if applicable. No additional management support is needed unless otherwise documented below in the visit note. 

## 2014-10-20 NOTE — Progress Notes (Signed)
Dr. Frederico Hamman T. Jaques Mineer, MD, [facility] [place], [address] Phone: [phone] Fax: [phone]  10/20/2014  Patient: [name], MRN: [id number], DOB: 19-Jun-1951, 64 y.o.  Primary Physician:  Owens Loffler, MD  Chief Complaint: Follow-up  Subjective:   [name] is a 64 y.o. very pleasant female patient who presents with the following:  Boils - whole string of them on her groin and her forehead. Have been recurrent, has a couple in the groin now that are improving.   Horrible substernal chest pain. For the last 3-4 weeks. Cannot really do anything. Pepto and Tums will help. Burping a lot. On Aciphex right now. Reproducible.  Snoring all the time - sleep study / OSA. Feeling tired during the daytime.   Diabetes Mellitus: Tolerating Medications: yes Compliance with diet: fair Exercise: minimal / intermittent Avg blood sugars at home: not checking Foot problems: none Hypoglycemia: none No nausea, vomitting, blurred vision, polyuria.  Lab Results  Component Value Date   HGBA1C 6.0 10/20/2014   HGBA1C 6.2 01/13/2014   HGBA1C 6.2 08/21/2013   Lab Results  Component Value Date   MICROALBUR 0.2 01/13/2014   LDLCALC 90 01/13/2014   CREATININE 0.99 10/20/2014    Wt Readings from Last 3 Encounters:  10/20/14 223 lb 8 oz (101.379 kg)  08/27/14 229 lb 4 oz (103.987 kg)  08/06/14 228 lb 4 oz (103.534 kg)    Body mass index is 37.19 kg/(m^2).   HTN: Tolerating all medications without side effects Stable and at goal No CP, no sob. No HA.  BP Readings from Last 3 Encounters:  10/20/14 120/60  10/10/14 138/62  09/11/14 088/11    Basic Metabolic Panel:    Component Value Date/Time   NA 137 10/20/2014 1516   K 4.1 10/20/2014 1516   CL 102 10/20/2014 1516   CO2 32 10/20/2014 1516   BUN 16 10/20/2014 1516   CREATININE 0.99 10/20/2014 1516   GLUCOSE 133* 10/20/2014 1516   CALCIUM 9.8 10/20/2014  1516   Thyroid: No symptoms. Labs reviewed. Denies cold / heat intolerance, dry skin, hair loss. No goiter.  Lab Results  Component Value Date   TSH 2.01 10/20/2014     Past Medical History, Surgical History, Social History, Family History, Problem List, Medications, and Allergies have been reviewed and updated if relevant.  GEN: No acute illnesses, no fevers, chills. GI: No n/v/d, eating normally Pulm: No SOB Interactive and getting along well at home.  Otherwise, ROS is as per the HPI.  Objective:   BP 120/60 mmHg  Pulse 85  Temp(Src) 99.2 F (37.3 C) (Oral)  Ht 5' 5"  (1.651 m)  Wt 223 lb 8 oz (101.379 kg)  BMI 37.19 kg/m2  GEN: WDWN, NAD, Non-toxic, A & O x 3 HEENT: Atraumatic, Normocephalic. Neck supple. No masses, No LAD. Ears and Nose: No external deformity. CV: RRR, No M/G/R. No JVD. No thrill. No extra heart sounds. Anterior chest wall TTP PULM: CTA B, no wheezes, crackles, rhonchi. No retractions. No resp. distress. No accessory muscle use. GU: R groin with 2 healing small boils This portion of the physical examination was chaperoned by Hedy Camara, CMA.  EXTR: No c/c/e NEURO Normal gait.  PSYCH: Normally interactive. Conversant. Not depressed or anxious appearing.  Calm demeanor.   Laboratory and Imaging Data: Results for orders placed or performed in visit on 09/08/92  Basic metabolic panel  Result Value Ref Range  Sodium 137 135 - 145 mEq/L   Potassium 4.1 3.5 - 5.1 mEq/L   Chloride 102 96 - 112 mEq/L   CO2 32 19 - 32 mEq/L   Glucose, Bld 133 (H) 70 - 99 mg/dL   BUN 16 6 - 23 mg/dL   Creatinine, Ser 0.99 0.40 - 1.20 mg/dL   Calcium 9.8 8.4 - 10.5 mg/dL   GFR 60.22 >60.00 mL/min  TSH  Result Value Ref Range   TSH 2.01 0.35 - 4.50 uIU/mL  Hemoglobin A1c  Result Value Ref Range   Hgb A1c MFr Bld 6.0 4.6 - 6.5 %     Assessment and Plan:   Type 2 diabetes mellitus with diabetic neuropathy - Plan: Basic metabolic panel, Hemoglobin A1c  Other  specified hypothyroidism - Plan: TSH  Essential hypertension  Obstructive sleep apnea - Plan: Ambulatory referral to Pulmonology  Recurrent boils  Doing well, DM, thyroid, htn all stable.  Very likely has sleep apnea, consult sleep med.  Boils - all look ok now. She will call me if they worsen, more pain, red, and i'll call in abx for her  CP seems likely combo MSK / fibro, but worsening burping and reflux sx too. Increase PPI to BID for the next few weeks  Follow-up: 6 mo  New Prescriptions   OMEPRAZOLE (PRILOSEC) 20 MG CAPSULE    Take 1 capsule (20 mg total) by mouth 2 (two) times daily before a meal.   Orders Placed This Encounter  Procedures  . Basic metabolic panel  . TSH  . Hemoglobin A1c  . Ambulatory referral to Pulmonology    Signed,  Frederico Hamman T. Lynnley Doddridge, MD   Patient's Medications  New Prescriptions   OMEPRAZOLE (PRILOSEC) 20 MG CAPSULE    Take 1 capsule (20 mg total) by mouth 2 (two) times daily before a meal.  Previous Medications   ASCORBIC ACID (VITAMIN C) 1000 MG TABLET    Take 1,000 mg by mouth daily.    ASPIRIN 81 MG TABLET    Take 81 mg by mouth daily.     B COMPLEX VITAMINS (B COMPLEX 100 PO)    Take 1 tablet by mouth daily.   CALCIUM CARBONATE (CALCIUM 600 PO)    Take 600 mg by mouth 2 (two) times daily.    CETIRIZINE (ZYRTEC) 10 MG TABLET    Take 10 mg by mouth daily.     CHOLECALCIFEROL (VITAMIN D) 2000 UNITS TABLET    Take 2,000 Units by mouth daily.     CITALOPRAM (CELEXA) 40 MG TABLET    Take 1 tablet (40 mg total) by mouth daily. MUST KEEP UPCOMING APPOINTMENT FOR MORE REFILLS   CLINDAMYCIN (CLEOCIN) 300 MG CAPSULE    Take 600 mg by mouth. before dental appointments   COENZYME Q-10 100 MG CAPSULE    Take 100 mg by mouth daily.   DICLOFENAC SODIUM (VOLTAREN) 1 % GEL    Apply 4 g topically 4 (four) times daily.   DOCUSATE SODIUM (COLACE) 100 MG CAPSULE    Take 100 mg by mouth at bedtime.   GABAPENTIN (NEURONTIN) 600 MG TABLET    TAKE 1 TABLET BY  MOUTH THREE TIMES A DAY   GLUCOSE MONITORING KIT (FREESTYLE) MONITORING KIT    1 each by Does not apply route as needed for other. DISPENSE CHEAPEST KIT, PER PATIENT INSURANCE, BID TESTING, DX 250.00   LEVOTHYROXINE (SYNTHROID, LEVOTHROID) 50 MCG TABLET    TAKE 1 TABLET BY MOUTH DAILY   LORATADINE (CLARITIN)  10 MG TABLET    Take 10 mg by mouth daily.   MELATON-THEAN-CHAM-PASSF-LBALM (MELATONIN + L-THEANINE PO)    Take by mouth at bedtime. 56m/25mg tablet    METFORMIN (GLUCOPHAGE) 500 MG TABLET    TAKE 1 TABLET BY MOUTH TWICE A DAY WITH A MEAL   METHOCARBAMOL (ROBAXIN) 500 MG TABLET    TAKE ONE TABLET EVERY 8 HOURS AS NEEDED FOR MUSCLE SPASMS   MIRTAZAPINE (REMERON) 15 MG TABLET    TAKE ONE TABLET BY MOUTH EVERY NIGHT AT BEDTIME   MORPHINE (MS CONTIN) 15 MG 12 HR TABLET    Take 1 tablet (15 mg total) by mouth 3 (three) times daily.   SENNA-DOCUSATE (PERI-COLACE) 8.6-50 MG PER TABLET    Take 2 tablets by mouth at bedtime as needed for mild constipation.   Modified Medications   Modified Medication Previous Medication   OXYBUTYNIN (DITROPAN-XL) 10 MG 24 HR TABLET oxybutynin (DITROPAN-XL) 10 MG 24 hr tablet      Take 1 tablet (10 mg total) by mouth at bedtime.    Take 1 tablet (10 mg total) by mouth at bedtime.  Discontinued Medications   RABEPRAZOLE (ACIPHEX) 20 MG TABLET    TAKE 1 TABLET BY MOUTH DAILY

## 2014-10-20 NOTE — Patient Instructions (Signed)
REFERRALS TO SPECIALISTS, SPECIAL TESTS (MRI, CT, ULTRASOUNDS)  [facility] or [facility] will help you. ASK CHECK-IN FOR HELP.  Imaging / Special Testing referrals sometimes can be done same day if EMERGENCY, but others can take 2 or 3 days to get an appointment. Starting in 2015, many of the new Medicare plans and Obamacare plans take much longer.   Specialist appointment times vary a great deal, based on their schedule / openings. -- Some specialists have very long wait times. (Example. Dermatology. Multiple months  for non-cancer)   

## 2014-10-25 ENCOUNTER — Other Ambulatory Visit: Payer: Self-pay | Admitting: Physical Medicine & Rehabilitation

## 2014-10-28 ENCOUNTER — Telehealth: Payer: Self-pay | Admitting: *Deleted

## 2014-10-28 NOTE — Telephone Encounter (Signed)
Patient left a voicemail that she was in last week and her reflux medication was changed. Patient stated that it has not helped at all and she is still having the chest pain from the reflux. Patient wanted to know if she should have other test done or what do you recommend?  Pharmacy [facility]

## 2014-10-28 NOTE — Telephone Encounter (Signed)
Let's try changing to protonix - give it about 10 days at this dose and strength, and if it does not work, then we'll get you to see GI  Generic protonix 40 mg, 1 po bid, #60, 5 ref

## 2014-10-29 MED ORDER — PANTOPRAZOLE SODIUM 40 MG PO TBEC: 40.0000 mg | DELAYED_RELEASE_TABLET | Freq: Two times a day (BID) | ORAL | Status: DC

## 2014-10-29 NOTE — Telephone Encounter (Signed)
Nancee notified as instructed by telephone.  Protonix prescription sent to [facility].

## 2014-11-07 ENCOUNTER — Encounter: Payer: Self-pay | Admitting: Registered Nurse

## 2014-11-07 ENCOUNTER — Encounter: Payer: Medicare Other | Attending: Physical Medicine & Rehabilitation | Admitting: Registered Nurse

## 2014-11-07 ENCOUNTER — Other Ambulatory Visit: Payer: Self-pay | Admitting: Physical Medicine & Rehabilitation

## 2014-11-07 VITALS — BP 123/67 | HR 62 | Resp 14

## 2014-11-07 DIAGNOSIS — Z79899 Other long term (current) drug therapy: Secondary | ICD-10-CM | POA: Insufficient documentation

## 2014-11-07 DIAGNOSIS — G894 Chronic pain syndrome: Secondary | ICD-10-CM

## 2014-11-07 DIAGNOSIS — M791 Myalgia: Secondary | ICD-10-CM

## 2014-11-07 DIAGNOSIS — IMO0001 Reserved for inherently not codable concepts without codable children: Secondary | ICD-10-CM

## 2014-11-07 DIAGNOSIS — M5135 Other intervertebral disc degeneration, thoracolumbar region: Secondary | ICD-10-CM

## 2014-11-07 DIAGNOSIS — IMO0002 Reserved for concepts with insufficient information to code with codable children: Secondary | ICD-10-CM

## 2014-11-07 DIAGNOSIS — Z5181 Encounter for therapeutic drug level monitoring: Secondary | ICD-10-CM | POA: Insufficient documentation

## 2014-11-07 DIAGNOSIS — M797 Fibromyalgia: Secondary | ICD-10-CM

## 2014-11-07 DIAGNOSIS — M609 Myositis, unspecified: Secondary | ICD-10-CM

## 2014-11-07 MED ORDER — MORPHINE SULFATE ER 15 MG PO TBCR: 15.0000 mg | EXTENDED_RELEASE_TABLET | Freq: Three times a day (TID) | ORAL | Status: DC

## 2014-11-07 NOTE — Progress Notes (Signed)
Subjective:    Patient ID: [name], female    DOB: February 01, 1951, 64 y.o.   MRN: [id number]  HPI: [name] is a 64 year old female who returns for follow up for chronic pain and medication refill. She says her pain is located in her left jaw and mid-back.She rates her pain 5. Her current exercise regime is performing stretching exercises and walking. She is walking 4-5 miles every other day.   Pain Inventory Average Pain 6 Pain Right Now 5 My pain is constant, burning and aching  In the last 24 hours, has pain interfered with the following? General activity 7 Relation with others 7 Enjoyment of life 7 What TIME of day is your pain at its worst? daytime,night Sleep (in general) Fair  Pain is worse with: walking, bending, sitting, inactivity, standing and some activites Pain improves with: rest, heat/ice, therapy/exercise, pacing activities, medication, TENS and injections Relief from Meds: 6  Mobility walk without assistance how many minutes can you walk? 10-15 ability to climb steps?  yes do you drive?  yes Do you have any goals in this area?  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 I need assistance with the following:  meal prep, household duties and shopping Do you have any goals in this area?  yes  Neuro/Psych bladder control problems weakness numbness tingling spasms depression  Prior Studies Any changes since last visit?  no  Physicians involved in your care Any changes since last visit?  no   Family History  Problem Relation Age of Onset  . Breast cancer Mother   . Diabetes Mother   . Heart disease Mother   . Diabetes Father   . Heart disease Father   . Stomach cancer Maternal Uncle   . Breast cancer Paternal Aunt   . Colon cancer Neg Hx    History   Social History  . Marital Status: Married    Spouse Name: N/A  . Number of Children: N/A  . Years of Education: N/A    Social History Main Topics  . Smoking status: Never Smoker   . Smokeless tobacco: Never Used  . Alcohol Use: No  . Drug Use: No  . Sexual Activity: Not on file   Other Topics Concern  . None   Social History Narrative   Past Surgical History  Procedure Laterality Date  . Appendectomy    . Cholecystectomy    . Tubal ligation    . Tonsillectomy and adenoidectomy    . Anterior cervical decomp/discectomy fusion  2009    first procedure in ~ 1993/Dr Joya Salm   Past Medical History  Diagnosis Date  . GERD (gastroesophageal reflux disease)   . Unspecified essential hypertension   . Fibromyalgia   . B12 deficiency   . Depression   . Irritable bowel syndrome   . Hyperlipemia   . Migraines   . HYPOTHYROIDISM 05/29/2007  . Type II or unspecified type diabetes mellitus with neurological manifestations, not stated as uncontrolled 04/07/2013  . Generalized hyperhidrosis 04/17/2014   BP 123/67 mmHg  Pulse 62  Resp 14  SpO2 98%  Opioid Risk Score:   Fall Risk Score: Moderate Fall Risk (6-13 points)`1  Depression screen PHQ 2/9  Depression screen [facility] 2/9 09/11/2014 10/15/2013  Decreased Interest 2 0  Down, Depressed, Hopeless 0 0  PHQ - 2 Score 2 0  Altered sleeping 0 -  Tired, decreased energy 2 -  Change in appetite 0 -  Feeling bad or failure about yourself  0 -  Trouble concentrating 1 -  Moving slowly or fidgety/restless 0 -  Suicidal thoughts 0 -  PHQ-9 Score 5 -     Review of Systems  Constitutional:       Night sweats  Gastrointestinal: Positive for constipation.  Neurological: Positive for weakness and numbness.       Tingling Spasms   All other systems reviewed and are negative.      Objective:   Physical Exam  Constitutional: She is oriented to person, place, and time. She appears well-developed and well-nourished.  HENT:  Head: Normocephalic.  Left Ear Tragus Painful with Palpation  Neck: Normal range of motion. Neck supple.  Cardiovascular:  Normal rate and regular rhythm.   Pulmonary/Chest: Effort normal and breath sounds normal.  Musculoskeletal:  Normal Muscle Bulk and Muscle Testing Reveals: Upper Extremities: Full ROM and Muscle Strength 5/5 Thoracic Paraspinal Tenderness: T-6- T-8 Lower Extremities: Full ROM and Muscle Strength 5/5 Arises from chair with ease Narrow Based Gait  Neurological: She is alert and oriented to person, place, and time.  Skin: Skin is warm and dry.  Psychiatric: She has a normal mood and affect.  Nursing note and vitals reviewed.         Assessment & Plan:  1. Cervical postlaminectomy syndrome: with neck and shoulder pain. Continue exercise routine and using heat therapy. Continue MS Contin 15 mg one tablet three times a day #90.  2. Fibromyalgia: Continue with activity and exercise regime. 3. Left Jaw Pain: PCP, ENT Following. She's awaiting oral surgeon appointment. She will call office when ready for PT referral. 4. Left Sternocleidomastoid Myofascial Pain: PCP, ENT following. S/P Trigger Point Inject: No relief noted : She will consider PT 15 minutes of face to face patient care time was spent during this visit. All questions were encouraged and answered.   F/U in 1 month

## 2014-11-08 LAB — PMP ALCOHOL METABOLITE (ETG): Ethyl Glucuronide (EtG): NEGATIVE ng/mL

## 2014-11-12 LAB — OPIATES/OPIOIDS (LC/MS-MS)
Codeine Urine: NEGATIVE ng/mL (ref <50)
Hydrocodone: NEGATIVE ng/mL (ref <50)
Hydromorphone: 99 ng/mL (ref <50)
Morphine Urine: 17309 ng/mL (ref <50)
Norhydrocodone, Ur: NEGATIVE ng/mL (ref <50)
Noroxycodone, Ur: NEGATIVE ng/mL (ref <50)
Oxycodone, ur: NEGATIVE ng/mL (ref <50)
Oxymorphone: NEGATIVE ng/mL (ref <50)

## 2014-11-14 LAB — PRESCRIPTION MONITORING PROFILE (SOLSTAS)
Amphetamine/Meth: NEGATIVE ng/mL
Barbiturate Screen, Urine: NEGATIVE ng/mL
Benzodiazepine Screen, Urine: NEGATIVE ng/mL
Buprenorphine, Urine: NEGATIVE ng/mL
Cannabinoid Scrn, Ur: NEGATIVE ng/mL
Carisoprodol, Urine: NEGATIVE ng/mL
Cocaine Metabolites: NEGATIVE ng/mL
Creatinine, Urine: 94.24 mg/dL (ref >20.0)
Fentanyl, Ur: NEGATIVE ng/mL
MDMA URINE: NEGATIVE ng/mL
Meperidine, Ur: NEGATIVE ng/mL
Methadone Screen, Urine: NEGATIVE ng/mL
Nitrites, Initial: NEGATIVE ug/mL
Oxycodone Screen, Ur: NEGATIVE ng/mL
Propoxyphene: NEGATIVE ng/mL
Tapentadol, urine: NEGATIVE ng/mL
Tramadol Scrn, Ur: NEGATIVE ng/mL
Zolpidem, Urine: NEGATIVE ng/mL
pH, Initial: 6.3 pH (ref 4.5–8.9)

## 2014-11-14 NOTE — Telephone Encounter (Signed)
refaxed prior approval request via Cover My Meds.

## 2014-11-26 NOTE — Telephone Encounter (Signed)
[employer] has upheld their decision NOT to cover methocarbamol.  Decision will be scanned into media.

## 2014-11-26 NOTE — Progress Notes (Signed)
Urine drug screen for this encounter is consistent for prescribed medication 

## 2014-12-04 ENCOUNTER — Telehealth: Payer: Self-pay | Admitting: Family Medicine

## 2014-12-04 NOTE — Telephone Encounter (Signed)
Left message for [name] that Dr. Lorelei Pont is unable to order the CT scan for the oral surgeon.  The Oral Surgeon  will have to be the one to order the CT and schedule it at a facility that her insurance will cover.  This is not appropriate for Dr. Lorelei Pont to order.  Advised to call me back if she had any questions.

## 2014-12-04 NOTE — Telephone Encounter (Signed)
Pt has an appointment with [facility].  They need ct scan.  [facility] call her and told her that her insurance won't pay if they order the ct scan and do it in house.  They wanted to know if dr  copland will order ct scan  Code is [phone]  Please advise Pt would like to go to [place] Late morning early afternoon  Can not due 12/08/14 any other day would be fine  She has a 2 pm appointment tomorrow but could morning tomorrow

## 2014-12-04 NOTE — Telephone Encounter (Signed)
No. Inappropriate. They should be able to order it in a facility that her insurance will cover.   Or alternatively, I can't get approved at all without face to face evaluation and justification or any procedure.

## 2014-12-05 ENCOUNTER — Other Ambulatory Visit: Payer: Self-pay

## 2014-12-05 ENCOUNTER — Encounter: Payer: Self-pay | Admitting: Registered Nurse

## 2014-12-05 ENCOUNTER — Other Ambulatory Visit: Payer: Self-pay | Admitting: Dentistry

## 2014-12-05 ENCOUNTER — Encounter: Payer: Medicare Other | Attending: Physical Medicine & Rehabilitation | Admitting: Registered Nurse

## 2014-12-05 VITALS — BP 134/61 | HR 65 | Resp 14

## 2014-12-05 DIAGNOSIS — M26629 Arthralgia of temporomandibular joint, unspecified side: Principal | ICD-10-CM

## 2014-12-05 DIAGNOSIS — G894 Chronic pain syndrome: Secondary | ICD-10-CM | POA: Diagnosis not present

## 2014-12-05 DIAGNOSIS — M545 Low back pain, unspecified: Secondary | ICD-10-CM

## 2014-12-05 DIAGNOSIS — G8929 Other chronic pain: Secondary | ICD-10-CM

## 2014-12-05 DIAGNOSIS — M797 Fibromyalgia: Secondary | ICD-10-CM | POA: Diagnosis not present

## 2014-12-05 DIAGNOSIS — Z5181 Encounter for therapeutic drug level monitoring: Secondary | ICD-10-CM | POA: Diagnosis not present

## 2014-12-05 DIAGNOSIS — Z79899 Other long term (current) drug therapy: Secondary | ICD-10-CM

## 2014-12-05 DIAGNOSIS — M5135 Other intervertebral disc degeneration, thoracolumbar region: Secondary | ICD-10-CM | POA: Diagnosis not present

## 2014-12-05 DIAGNOSIS — IMO0002 Reserved for concepts with insufficient information to code with codable children: Secondary | ICD-10-CM

## 2014-12-05 MED ORDER — MORPHINE SULFATE ER 15 MG PO TBCR: 15.0000 mg | EXTENDED_RELEASE_TABLET | Freq: Three times a day (TID) | ORAL | Status: DC

## 2014-12-05 NOTE — Progress Notes (Signed)
Subjective:    Patient ID: [name], female    DOB: 07/29/1950, 64 y.o.   MRN: [id number]  HPI: [name] is a 64 year old female who returns for follow up for chronic pain and medication refill. She says her pain is located in her left jaw ( TMJ) and mid-back.She rates her pain 5. Her current exercise regime is performing stretching exercises and walking. She is walking 4-5 miles every other day.  She has an appointment with Dr. Lewanda Rife oral surgeon on 12/10/2014. She's trying to obtain a CT scan her PCP wouldn't ordered. Our office placed a call to Dr. Lewanda Rife office to obtain detail information we left message with office answering service. She was encouraged to call her dentist she verbalizes understanding. [name] called stating her dentist ordered the Ct Scan.  Pain Inventory Average Pain 5 Pain Right Now 5 My pain is constant, burning and aching  In the last 24 hours, has pain interfered with the following? General activity 5 Relation with others 5 Enjoyment of life 6 What TIME of day is your pain at its worst? daytime evening Sleep (in general) Fair  Pain is worse with: walking, bending, sitting, inactivity, standing and some activites Pain improves with: rest, heat/ice, therapy/exercise, pacing activities, medication, TENS and injections Relief from Meds: 6  Mobility walk without assistance how many minutes can you walk? 10-15 ability to climb steps?  yes do you drive?  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  no  Physicians involved in your care Any changes since last visit?  no   Family History  Problem Relation Age of Onset  . Breast cancer Mother   . Diabetes Mother   .  Heart disease Mother   . Diabetes Father   . Heart disease Father   . Stomach cancer Maternal Uncle   . Breast cancer Paternal Aunt   . Colon cancer Neg Hx    History   Social History  . Marital Status: Married    Spouse Name: N/A  . Number of Children: N/A  . Years of Education: N/A   Social History Main Topics  . Smoking status: Never Smoker   . Smokeless tobacco: Never Used  . Alcohol Use: No  . Drug Use: No  . Sexual Activity: Not on file   Other Topics Concern  . None   Social History Narrative   Past Surgical History  Procedure Laterality Date  . Appendectomy    . Cholecystectomy    . Tubal ligation    . Tonsillectomy and adenoidectomy    . Anterior cervical decomp/discectomy fusion  2009    first procedure in ~ 1993/Dr Joya Salm   Past Medical History  Diagnosis Date  . GERD (gastroesophageal reflux disease)   . Unspecified essential hypertension   . Fibromyalgia   . B12 deficiency   . Depression   . Irritable bowel syndrome   . Hyperlipemia   . Migraines   . HYPOTHYROIDISM 05/29/2007  . Type II or unspecified type diabetes mellitus with neurological manifestations, not stated as uncontrolled 04/07/2013  . Generalized hyperhidrosis 04/17/2014   BP 134/61 mmHg  Pulse 65  Resp 14  SpO2 96%  Opioid Risk Score:   Fall Risk Score: Moderate  Fall Risk (6-13 points) (previously educated and given handout)`1  Depression screen PHQ 2/9  Depression screen [facility] 2/9 09/11/2014 10/15/2013  Decreased Interest 2 0  Down, Depressed, Hopeless 0 0  PHQ - 2 Score 2 0  Altered sleeping 0 -  Tired, decreased energy 2 -  Change in appetite 0 -  Feeling bad or failure about yourself  0 -  Trouble concentrating 1 -  Moving slowly or fidgety/restless 0 -  Suicidal thoughts 0 -  PHQ-9 Score 5 -    Review of Systems  Cardiovascular: Positive for leg swelling.  Gastrointestinal: Positive for constipation.  Genitourinary:       Bladder control problems    Musculoskeletal:       Spasms  Neurological: Positive for weakness and numbness.       Tingling  Psychiatric/Behavioral: Positive for dysphoric mood.  All other systems reviewed and are negative.      Objective:   Physical Exam  Constitutional: She is oriented to person, place, and time. She appears well-developed and well-nourished.  HENT:  Head: Normocephalic.  Neck: Normal range of motion. Neck supple.  Cardiovascular: Normal rate and regular rhythm.   Pulmonary/Chest: Effort normal and breath sounds normal.  Musculoskeletal:  Normal Muscle Bulk and Muscle Testing Reveals: Upper Extremities: Full ROM and Muscle Strength 5/5 Back without spinal or paraspinal tenderness Lower Extremities: Full ROM and Muscle Strength 5/5 Arises from chair with ease Narrow Based gait  Neurological: She is alert and oriented to person, place, and time.  Skin: Skin is warm and dry.  Psychiatric: She has a normal mood and affect.  Nursing note and vitals reviewed.         Assessment & Plan:  1. Cervical postlaminectomy syndrome: with neck and shoulder pain. Continue exercise routine and using heat therapy. Continue MS Contin 15 mg one tablet three times a day #90.  2. Fibromyalgia: Continue with activity and exercise regime. 3. Left Jaw Pain: PCP, ENT Following. Has an appointment on  June 15th with Dr. Lewanda Rife oral surgeon . 4. Left Sternocleidomastoid Myofascial Pain: PCP, ENT following.  15 minutes of face to face patient care time was spent during this visit. All questions were encouraged and answered.   F/U in 1 month

## 2014-12-08 ENCOUNTER — Other Ambulatory Visit: Payer: Self-pay | Admitting: Dentistry

## 2014-12-08 DIAGNOSIS — G8929 Other chronic pain: Secondary | ICD-10-CM

## 2014-12-08 DIAGNOSIS — M26629 Arthralgia of temporomandibular joint, unspecified side: Principal | ICD-10-CM

## 2014-12-09 ENCOUNTER — Ambulatory Visit
Admission: RE | Admit: 2014-12-09 | Discharge: 2014-12-09 | Disposition: A | Discharge status: Home / Self Care | Payer: Medicare Other | Source: Ambulatory Visit | Attending: Oral Surgery | Admitting: Oral Surgery

## 2014-12-09 ENCOUNTER — Other Ambulatory Visit: Payer: Medicare Other

## 2014-12-09 DIAGNOSIS — M26629 Arthralgia of temporomandibular joint, unspecified side: Principal | ICD-10-CM

## 2014-12-09 DIAGNOSIS — J3489 Other specified disorders of nose and nasal sinuses: Secondary | ICD-10-CM | POA: Diagnosis not present

## 2014-12-09 DIAGNOSIS — H9202 Otalgia, left ear: Secondary | ICD-10-CM | POA: Diagnosis not present

## 2014-12-09 DIAGNOSIS — G8929 Other chronic pain: Secondary | ICD-10-CM

## 2014-12-09 DIAGNOSIS — M2662 Arthralgia of temporomandibular joint: Secondary | ICD-10-CM | POA: Diagnosis not present

## 2014-12-11 ENCOUNTER — Ambulatory Visit (INDEPENDENT_AMBULATORY_CARE_PROVIDER_SITE_OTHER): Payer: Medicare Other | Admitting: Pulmonary Disease

## 2014-12-11 ENCOUNTER — Encounter: Payer: Self-pay | Admitting: Pulmonary Disease

## 2014-12-11 VITALS — BP 106/54 | HR 69 | Ht 66.0 in | Wt 222.8 lb

## 2014-12-11 DIAGNOSIS — G478 Other sleep disorders: Secondary | ICD-10-CM | POA: Diagnosis not present

## 2014-12-11 DIAGNOSIS — G4730 Sleep apnea, unspecified: Secondary | ICD-10-CM
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
## 2014-12-11 NOTE — Patient Instructions (Signed)
Will arrange for sleep study Will call to arrange for follow up after sleep study reviewed 

## 2014-12-11 NOTE — Progress Notes (Deleted)
   Subjective:    Patient ID: [name], female    DOB: 07/30/1950, 64 y.o.   MRN: [id number]  HPI    Review of Systems  Constitutional: Negative for fever and unexpected weight change.  HENT: Positive for congestion, dental problem and trouble swallowing. Negative for ear pain, nosebleeds, postnasal drip, rhinorrhea, sinus pressure, sneezing and sore throat.   Eyes: Negative for redness and itching.  Respiratory: Positive for cough and shortness of breath. Negative for chest tightness and wheezing.   Cardiovascular: Positive for chest pain and palpitations. Negative for leg swelling.  Gastrointestinal: Negative for nausea and vomiting.       Acid heartburn // idigestion  Genitourinary: Negative for dysuria.  Musculoskeletal: Positive for joint swelling and arthralgias.  Skin: Positive for rash ( itching).  Neurological: Positive for headaches.  Hematological: Does not bruise/bleed easily.  Psychiatric/Behavioral: Positive for dysphoric mood. The patient is not nervous/anxious.        Objective:   Physical Exam        Assessment & Plan:

## 2014-12-13 ENCOUNTER — Other Ambulatory Visit: Payer: Self-pay | Admitting: Family Medicine

## 2014-12-23 ENCOUNTER — Other Ambulatory Visit: Payer: Self-pay | Admitting: *Deleted

## 2014-12-23 MED ORDER — GABAPENTIN 600 MG PO TABS: 600.0000 mg | ORAL_TABLET | Freq: Three times a day (TID) | ORAL | Status: DC

## 2014-12-24 ENCOUNTER — Other Ambulatory Visit: Payer: Self-pay | Admitting: *Deleted

## 2014-12-24 MED ORDER — CITALOPRAM HYDROBROMIDE 40 MG PO TABS: 40.0000 mg | ORAL_TABLET | Freq: Every day | ORAL | Status: DC

## 2014-12-24 MED ORDER — MIRTAZAPINE 15 MG PO TABS: 15.0000 mg | ORAL_TABLET | Freq: Every day | ORAL | Status: DC

## 2014-12-24 MED ORDER — LEVOTHYROXINE SODIUM 50 MCG PO TABS: 50.0000 ug | ORAL_TABLET | Freq: Every day | ORAL | Status: DC

## 2014-12-24 MED ORDER — OXYBUTYNIN CHLORIDE ER 10 MG PO TB24: 10.0000 mg | ORAL_TABLET | Freq: Every day | ORAL | Status: DC

## 2014-12-24 MED ORDER — PANTOPRAZOLE SODIUM 40 MG PO TBEC: 40.0000 mg | DELAYED_RELEASE_TABLET | Freq: Two times a day (BID) | ORAL | Status: DC

## 2014-12-31 ENCOUNTER — Other Ambulatory Visit: Payer: Self-pay | Admitting: Physical Medicine & Rehabilitation

## 2015-01-07 ENCOUNTER — Encounter: Payer: Self-pay | Admitting: Registered Nurse

## 2015-01-07 ENCOUNTER — Encounter: Payer: Medicare Other | Attending: Physical Medicine & Rehabilitation | Admitting: Registered Nurse

## 2015-01-07 VITALS — BP 132/53 | HR 73 | Resp 14

## 2015-01-07 DIAGNOSIS — Z79899 Other long term (current) drug therapy: Secondary | ICD-10-CM | POA: Diagnosis not present

## 2015-01-07 DIAGNOSIS — M5135 Other intervertebral disc degeneration, thoracolumbar region: Secondary | ICD-10-CM | POA: Diagnosis not present

## 2015-01-07 DIAGNOSIS — Z5181 Encounter for therapeutic drug level monitoring: Secondary | ICD-10-CM | POA: Insufficient documentation

## 2015-01-07 DIAGNOSIS — M797 Fibromyalgia: Secondary | ICD-10-CM | POA: Diagnosis not present

## 2015-01-07 DIAGNOSIS — G894 Chronic pain syndrome: Secondary | ICD-10-CM | POA: Insufficient documentation

## 2015-01-07 DIAGNOSIS — M545 Low back pain, unspecified: Secondary | ICD-10-CM

## 2015-01-07 DIAGNOSIS — IMO0002 Reserved for concepts with insufficient information to code with codable children: Secondary | ICD-10-CM

## 2015-01-07 MED ORDER — METHOCARBAMOL 500 MG PO TABS: ORAL_TABLET | ORAL | Status: DC

## 2015-01-07 MED ORDER — MORPHINE SULFATE ER 15 MG PO TBCR: 15.0000 mg | EXTENDED_RELEASE_TABLET | Freq: Three times a day (TID) | ORAL | Status: DC

## 2015-01-07 NOTE — Progress Notes (Signed)
Subjective:    Patient ID: [name], female    DOB: October 16, 1950, 64 y.o.   MRN: [id number]  HPI: [name] is a 64 year old female who returns for follow up for chronic pain and medication refill. She says her pain is located in her mid-back.She rates her pain 4. Her current exercise regime is performing stretching exercises and walking.   She had her appointment with Dr. Lewanda Rife oral surgeon on 12/10/2014 he recommended physical therapy for TMJ she states. She will call to make the appointment.    Pain Inventory Average Pain 5 Pain Right Now 4 My pain is constant, burning and aching  In the last 24 hours, has pain interfered with the following? General activity 5 Relation with others 6 Enjoyment of life 5 What TIME of day is your pain at its worst? evening,night Sleep (in general) Fair  Pain is worse with: walking, bending, sitting, inactivity, standing and some activites Pain improves with: rest, heat/ice, therapy/exercise, pacing activities, medication, TENS and injections Relief from Meds: 5  Mobility walk without assistance how many minutes can you walk? 10-15 ability to climb steps?  yes do you drive?  yes Do you have any goals in this area?  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  yes  Neuro/Psych bladder control problems weakness numbness tremor tingling spasms depression  Prior Studies Any changes since last visit?  no  Physicians involved in your care Any changes since last visit?  no   Family History  Problem Relation Age of Onset  . Breast cancer Mother   . Diabetes Mother   . Heart disease Mother   . Diabetes Father   . Heart disease Father   . Stomach cancer Maternal Uncle   . Breast cancer Paternal Aunt   . Colon cancer Neg Hx   . Emphysema Father     . Allergies Mother   . Allergies Father    History   Social History  . Marital Status: Married    Spouse Name: N/A  . Number of Children: N/A  . Years of Education: N/A   Occupational History  . Bookkeeper    Social History Main Topics  . Smoking status: Never Smoker   . Smokeless tobacco: Never Used  . Alcohol Use: No  . Drug Use: No  . Sexual Activity: Not on file   Other Topics Concern  . None   Social History Narrative   Past Surgical History  Procedure Laterality Date  . Appendectomy  1988  . Cholecystectomy  2005  . Tubal ligation  1979  . Tonsillectomy and adenoidectomy    . Anterior cervical decomp/discectomy fusion  2009, 1993    first procedure in ~ 1993/Dr Botero  . Nasal sinus surgery  2006  . Abdominal hysterectomy  1988   Past Medical History  Diagnosis Date  . GERD (gastroesophageal reflux disease)   . Unspecified essential hypertension   . Fibromyalgia   . B12 deficiency   . Depression   . Irritable bowel syndrome   . Hyperlipemia   . Migraines   . HYPOTHYROIDISM 05/29/2007  . Type II or unspecified type diabetes mellitus with neurological manifestations, not stated as uncontrolled 04/07/2013  . Generalized hyperhidrosis 04/17/2014   BP 132/53 mmHg  Pulse 73  Resp 14  SpO2 96%  Opioid Risk Score:   Fall Risk Score: Moderate Fall Risk (6-13 points)`1  Depression screen PHQ 2/9  Depression screen [facility] 2/9 09/11/2014 10/15/2013  Decreased Interest 2 0  Down, Depressed, Hopeless 0 0  PHQ - 2 Score 2 0  Altered sleeping 0 -  Tired, decreased energy 2 -  Change in appetite 0 -  Feeling bad or failure about yourself  0 -  Trouble concentrating 1 -  Moving slowly or fidgety/restless 0 -  Suicidal thoughts 0 -  PHQ-9 Score 5 -     Review of Systems  Cardiovascular: Positive for leg swelling.  Gastrointestinal: Positive for constipation.  Genitourinary: Positive for urgency.  Neurological: Positive for tremors, weakness and numbness.        Tingling Spasms   Psychiatric/Behavioral: Positive for dysphoric mood.  All other systems reviewed and are negative.      Objective:   Physical Exam  Constitutional: She is oriented to person, place, and time. She appears well-developed and well-nourished.  HENT:  Head: Normocephalic and atraumatic.  Neck: Normal range of motion. Neck supple.  Cardiovascular: Normal rate and regular rhythm.   Pulmonary/Chest: Effort normal and breath sounds normal.  Musculoskeletal:  Normal Muscle Bulk and Muscle Testing Reveals: Upper Extremities: Full ROM and Muscle Strength  5/5 Back without spinal or paraspinal tenderness Lower Extremities: Full ROM and Muscle Strength 5/5 Arises from chair with ease Narrow Based gait  Neurological: She is alert and oriented to person, place, and time.  Skin: Skin is warm and dry.  Psychiatric: She has a normal mood and affect.  Nursing note and vitals reviewed.         Assessment & Plan:  1. Cervical postlaminectomy syndrome: with neck and shoulder pain. Continue exercise routine and using heat therapy. Continue MS Contin 15 mg one tablet three times a day #90.  2. Fibromyalgia: Continue with activity and exercise regime. 3. Left Jaw Pain: No complaints Today: PCP, ENT/ and Oral Surgeon  Dr. Lewanda Rife following . 4. Left Sternocleidomastoid Myofascial Pain:No complaints today.   15 minutes of face to face patient care time was spent during this visit. All questions were encouraged and answered.   F/U in 1 month

## 2015-02-04 ENCOUNTER — Encounter: Payer: Medicare Other | Attending: Physical Medicine & Rehabilitation | Admitting: Registered Nurse

## 2015-02-04 ENCOUNTER — Other Ambulatory Visit: Payer: Self-pay | Admitting: Physical Medicine & Rehabilitation

## 2015-02-04 ENCOUNTER — Encounter: Payer: Self-pay | Admitting: Registered Nurse

## 2015-02-04 VITALS — BP 133/52 | HR 75

## 2015-02-04 DIAGNOSIS — Z5181 Encounter for therapeutic drug level monitoring: Secondary | ICD-10-CM | POA: Insufficient documentation

## 2015-02-04 DIAGNOSIS — G894 Chronic pain syndrome: Secondary | ICD-10-CM | POA: Diagnosis not present

## 2015-02-04 DIAGNOSIS — M545 Low back pain, unspecified: Secondary | ICD-10-CM

## 2015-02-04 DIAGNOSIS — M797 Fibromyalgia: Secondary | ICD-10-CM

## 2015-02-04 DIAGNOSIS — M961 Postlaminectomy syndrome, not elsewhere classified: Secondary | ICD-10-CM | POA: Diagnosis not present

## 2015-02-04 DIAGNOSIS — M791 Myalgia: Secondary | ICD-10-CM | POA: Diagnosis not present

## 2015-02-04 DIAGNOSIS — Z79899 Other long term (current) drug therapy: Secondary | ICD-10-CM | POA: Diagnosis not present

## 2015-02-04 MED ORDER — MORPHINE SULFATE ER 15 MG PO TBCR: 15.0000 mg | EXTENDED_RELEASE_TABLET | Freq: Three times a day (TID) | ORAL | Status: DC

## 2015-02-04 NOTE — Progress Notes (Signed)
Subjective:    Patient ID: [name], female    DOB: 1950-08-06, 64 y.o.   MRN: [id number]  HPI: [name] is a 64 year old female who returns for follow up for chronic pain and medication refill. She says her pain is located in her mid-back.She rates her pain 6. Her current exercise regime is performing stretching exercises and walking.  Pain Inventory Average Pain 5 Pain Right Now 6 My pain is constant, burning and aching  In the last 24 hours, has pain interfered with the following? General activity 6 Relation with others 7 Enjoyment of life 6 What TIME of day is your pain at its worst? night Sleep (in general) Fair  Pain is worse with: walking, bending, sitting, inactivity, standing and some activites Pain improves with: rest, heat/ice, therapy/exercise, pacing activities, medication, TENS and injections Relief from Meds: 6  Mobility walk without assistance how many minutes can you walk? 15 ability to climb steps?  yes do you drive?  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  no  Physicians involved in your care Any changes since last visit?  no   Family History  Problem Relation Age of Onset  . Breast cancer Mother   . Diabetes Mother   . Heart disease Mother   . Diabetes Father   . Heart disease Father   . Stomach cancer Maternal Uncle   . Breast cancer Paternal Aunt   . Colon cancer Neg Hx   . Emphysema Father   . Allergies Mother   . Allergies Father    Social History   Social History  . Marital Status: Married    Spouse Name: N/A  . Number of Children: N/A  . Years of Education: N/A   Occupational History  . Bookkeeper    Social History Main Topics  . Smoking status:  Never Smoker   . Smokeless tobacco: Never Used  . Alcohol Use: No  . Drug Use: No  . Sexual Activity: Not Asked   Other Topics Concern  . None   Social History Narrative   Past Surgical History  Procedure Laterality Date  . Appendectomy  1988  . Cholecystectomy  2005  . Tubal ligation  1979  . Tonsillectomy and adenoidectomy    . Anterior cervical decomp/discectomy fusion  2009, 1993    first procedure in ~ 1993/Dr Botero  . Nasal sinus surgery  2006  . Abdominal hysterectomy  1988   Past Medical History  Diagnosis Date  . GERD (gastroesophageal reflux disease)   . Unspecified essential hypertension   . Fibromyalgia   . B12 deficiency   . Depression   . Irritable bowel syndrome   . Hyperlipemia   . Migraines   . HYPOTHYROIDISM 05/29/2007  . Type II or unspecified type diabetes mellitus with neurological manifestations, not stated as uncontrolled 04/07/2013  . Generalized hyperhidrosis 04/17/2014   BP 133/52 mmHg  Pulse 75  SpO2 96%  Opioid Risk Score:   Fall Risk Score:  `1  Depression screen PHQ 2/9  Depression screen [facility] 2/9 02/04/2015 09/11/2014 10/15/2013  Decreased Interest 0 2 0  Down, Depressed, Hopeless 0 0 0  PHQ - 2 Score 0 2 0  Altered sleeping - 0 -  Tired, decreased energy -  2 -  Change in appetite - 0 -  Feeling bad or failure about yourself  - 0 -  Trouble concentrating - 1 -  Moving slowly or fidgety/restless - 0 -  Suicidal thoughts - 0 -  PHQ-9 Score - 5 -     Review of Systems  Cardiovascular: Positive for leg swelling.  Gastrointestinal: Positive for constipation.  Musculoskeletal:       Spasms  Neurological: Positive for tremors and numbness.       Tingling  Psychiatric/Behavioral: Positive for dysphoric mood.  All other systems reviewed and are negative.      Objective:   Physical Exam  Constitutional: She is oriented to person, place, and time. She appears well-developed and well-nourished.  HENT:  Head: Normocephalic and  atraumatic.  Neck: Normal range of motion. Neck supple.  Cardiovascular: Normal rate and regular rhythm.   Pulmonary/Chest: Effort normal and breath sounds normal.  Musculoskeletal:  Normal Muscle Bulk and Muscle Testing Reveals: Upper Extremities: Full ROM and Muscle Strength 5/5 Thoracic Paraspinal tenderness: T-5- T-7 Lower Extremities: Full ROM and Muscle Strength 5/5 Arises from chair with ease Narrow based gait   Neurological: She is alert and oriented to person, place, and time.  Skin: Skin is warm and dry.  Psychiatric: She has a normal mood and affect.  Nursing note and vitals reviewed.         Assessment & Plan:  1. Cervical postlaminectomy syndrome: with neck and shoulder pain. Continue exercise routine and using heat therapy. Continue MS Contin 15 mg one tablet three times a day #90.  2. Fibromyalgia: Continue with activity and exercise regime. 3. Left Jaw Pain: No complaints Today: PCP, ENT/ and Oral Surgeon Dr. Lewanda Rife following .  15 minutes of face to face patient care time was spent during this visit. All questions were encouraged and answered.   F/U in 1 month

## 2015-02-06 LAB — PMP ALCOHOL METABOLITE (ETG): Ethyl Glucuronide (EtG): NEGATIVE ng/mL

## 2015-02-08 LAB — OPIATES/OPIOIDS (LC/MS-MS)
Codeine Urine: NEGATIVE ng/mL (ref <50)
Hydrocodone: NEGATIVE ng/mL (ref <50)
Hydromorphone: 106 ng/mL (ref <50)
Morphine Urine: 13625 ng/mL (ref <50)
Norhydrocodone, Ur: NEGATIVE ng/mL (ref <50)
Noroxycodone, Ur: NEGATIVE ng/mL (ref <50)
Oxycodone, ur: NEGATIVE ng/mL (ref <50)
Oxymorphone: NEGATIVE ng/mL (ref <50)

## 2015-02-10 LAB — PRESCRIPTION MONITORING PROFILE (SOLSTAS)
Amphetamine/Meth: NEGATIVE ng/mL
Barbiturate Screen, Urine: NEGATIVE ng/mL
Benzodiazepine Screen, Urine: NEGATIVE ng/mL
Buprenorphine, Urine: NEGATIVE ng/mL
Cannabinoid Scrn, Ur: NEGATIVE ng/mL
Carisoprodol, Urine: NEGATIVE ng/mL
Cocaine Metabolites: NEGATIVE ng/mL
Creatinine, Urine: 80.48 mg/dL (ref >20.0)
Fentanyl, Ur: NEGATIVE ng/mL
MDMA URINE: NEGATIVE ng/mL
Meperidine, Ur: NEGATIVE ng/mL
Methadone Screen, Urine: NEGATIVE ng/mL
Nitrites, Initial: NEGATIVE ug/mL
Oxycodone Screen, Ur: NEGATIVE ng/mL
Propoxyphene: NEGATIVE ng/mL
Tapentadol, urine: NEGATIVE ng/mL
Tramadol Scrn, Ur: NEGATIVE ng/mL
Zolpidem, Urine: NEGATIVE ng/mL
pH, Initial: 5.6 pH (ref 4.5–8.9)

## 2015-02-13 NOTE — Progress Notes (Signed)
Urine drug screen for this encounter is consistent for prescribed medication 

## 2015-02-26 ENCOUNTER — Ambulatory Visit (HOSPITAL_BASED_OUTPATIENT_CLINIC_OR_DEPARTMENT_OTHER): Payer: Medicare Other | Attending: Pulmonary Disease

## 2015-02-26 VITALS — Ht 66.0 in | Wt 222.0 lb

## 2015-02-26 DIAGNOSIS — G4730 Sleep apnea, unspecified: Secondary | ICD-10-CM

## 2015-02-26 DIAGNOSIS — G4734 Idiopathic sleep related nonobstructive alveolar hypoventilation: Secondary | ICD-10-CM | POA: Diagnosis not present

## 2015-02-26 DIAGNOSIS — G478 Other sleep disorders: Secondary | ICD-10-CM | POA: Insufficient documentation

## 2015-02-26 DIAGNOSIS — G4733 Obstructive sleep apnea (adult) (pediatric): Secondary | ICD-10-CM

## 2015-03-04 ENCOUNTER — Encounter: Payer: Medicare Other | Attending: Physical Medicine & Rehabilitation | Admitting: Registered Nurse

## 2015-03-04 ENCOUNTER — Encounter: Payer: Self-pay | Admitting: Registered Nurse

## 2015-03-04 VITALS — BP 137/54 | HR 72

## 2015-03-04 DIAGNOSIS — G894 Chronic pain syndrome: Secondary | ICD-10-CM

## 2015-03-04 DIAGNOSIS — Z5181 Encounter for therapeutic drug level monitoring: Secondary | ICD-10-CM | POA: Diagnosis not present

## 2015-03-04 DIAGNOSIS — M797 Fibromyalgia: Secondary | ICD-10-CM

## 2015-03-04 DIAGNOSIS — M545 Low back pain, unspecified: Secondary | ICD-10-CM

## 2015-03-04 DIAGNOSIS — Z79899 Other long term (current) drug therapy: Secondary | ICD-10-CM

## 2015-03-04 MED ORDER — MORPHINE SULFATE ER 15 MG PO TBCR: 15.0000 mg | EXTENDED_RELEASE_TABLET | Freq: Three times a day (TID) | ORAL | Status: DC

## 2015-03-04 NOTE — Progress Notes (Signed)
Subjective:    Patient ID: [name], female    DOB: May 12, 1951, 64 y.o.   MRN: [id number]  HPI: [name] is a 64 year old female who returns for follow up for chronic pain and medication refill. She says her pain is located in her mid-back.She rates her pain 6. Her current exercise regime is performing stretching exercises and walking.   Pain Inventory Average Pain 6 Pain Right Now 6 My pain is constant, burning and aching  In the last 24 hours, has pain interfered with the following? General activity 6 Relation with others 7 Enjoyment of life 6 What TIME of day is your pain at its worst? daytime, night Sleep (in general) Fair  Pain is worse with: walking, bending, sitting, inactivity, standing and some activites Pain improves with: rest, heat/ice, therapy/exercise, pacing activities, medication, TENS and injections Relief from Meds: 6  Mobility walk without assistance how many minutes can you walk? 10-15 ability to climb steps?  yes do you drive?  yes Do you have any goals in this area?  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 I need assistance with the following:  meal prep, household duties and shopping Do you have any goals in this area?  yes  Neuro/Psych bladder control problems tremor tingling spasms dizziness depression  Prior Studies Any changes since last visit?  no  Physicians involved in your care Any changes since last visit?  no   Family History  Problem Relation Age of Onset  . Breast cancer Mother   . Diabetes Mother   . Heart disease Mother   . Diabetes Father   . Heart disease Father   . Stomach cancer Maternal Uncle   . Breast cancer Paternal Aunt   . Colon cancer Neg Hx   . Emphysema Father   . Allergies Mother   . Allergies Father    Social History   Social History  . Marital Status: Married    Spouse Name: N/A  . Number of Children: N/A  . Years of  Education: N/A   Occupational History  . Bookkeeper    Social History Main Topics  . Smoking status: Never Smoker   . Smokeless tobacco: Never Used  . Alcohol Use: No  . Drug Use: No  . Sexual Activity: Not Asked   Other Topics Concern  . None   Social History Narrative   Past Surgical History  Procedure Laterality Date  . Appendectomy  1988  . Cholecystectomy  2005  . Tubal ligation  1979  . Tonsillectomy and adenoidectomy    . Anterior cervical decomp/discectomy fusion  2009, 1993    first procedure in ~ 1993/Dr Botero  . Nasal sinus surgery  2006  . Abdominal hysterectomy  1988   Past Medical History  Diagnosis Date  . GERD (gastroesophageal reflux disease)   . Unspecified essential hypertension   . Fibromyalgia   . B12 deficiency   . Depression   . Irritable bowel syndrome   . Hyperlipemia   . Migraines   . HYPOTHYROIDISM 05/29/2007  . Type II or unspecified type diabetes mellitus with neurological manifestations, not stated as uncontrolled 04/07/2013  . Generalized hyperhidrosis 04/17/2014   There were no vitals taken for this visit.  Opioid Risk Score:   Fall Risk Score:  `1  Depression screen PHQ 2/9  Depression screen [facility] 2/9 02/04/2015 09/11/2014 10/15/2013  Decreased Interest 0 2 0  Down,  Depressed, Hopeless 0 0 0  PHQ - 2 Score 0 2 0  Altered sleeping - 0 -  Tired, decreased energy - 2 -  Change in appetite - 0 -  Feeling bad or failure about yourself  - 0 -  Trouble concentrating - 1 -  Moving slowly or fidgety/restless - 0 -  Suicidal thoughts - 0 -  PHQ-9 Score - 5 -     Review of Systems  Cardiovascular: Positive for leg swelling.  Gastrointestinal: Positive for constipation.  Neurological: Positive for dizziness, tremors and weakness.       Tingling Spasms   Psychiatric/Behavioral: Positive for dysphoric mood.       Objective:   Physical Exam  Constitutional: She is oriented to person, place, and time. She appears well-developed  and well-nourished.  HENT:  Head: Normocephalic and atraumatic.  Neck: Normal range of motion. Neck supple.  Cardiovascular: Normal rate and regular rhythm.   Pulmonary/Chest: Effort normal and breath sounds normal.  Musculoskeletal:  Normal Muscle Bulk and Muscle Testing Reveals: Upper Extremities: Full ROM and Muscle Strength 5/5 Thoracic Paraspinal Tenderness: T-7- T-9 Lower Extremities: Full ROM and Muscle Strength 5/5 Arises from chair with ease Narrow Based Gait  Neurological: She is alert and oriented to person, place, and time.  Skin: Skin is warm and dry.  Psychiatric: She has a normal mood and affect.  Nursing note and vitals reviewed.         Assessment & Plan:  1. Cervical postlaminectomy syndrome: with neck and shoulder pain. Continue exercise routine and using heat therapy. Continue MS Contin 15 mg one tablet three times a day #90.  2. Fibromyalgia: Continue with activity and exercise regime.  15 minutes of face to face patient care time was spent during this visit. All questions were encouraged and answered.   F/U in 1 month

## 2015-03-09 ENCOUNTER — Encounter: Payer: Self-pay | Admitting: Family Medicine

## 2015-03-09 ENCOUNTER — Telehealth: Payer: Self-pay | Admitting: Pulmonary Disease

## 2015-03-09 DIAGNOSIS — G4734 Idiopathic sleep related nonobstructive alveolar hypoventilation: Secondary | ICD-10-CM | POA: Insufficient documentation

## 2015-03-09 NOTE — Telephone Encounter (Signed)
PSG 02/26/15 >> AHI 4.4, SaO2 low 83%.  Spent 23.6 min with SpO2 < 88%.   Will have my nurse inform pt that sleep study did not show sleep apnea, but did show low oxygen level while asleep.  Please arrange for overnight oximetry on room air to confirm sleep study finding.  Please also schedule ROV with me to review test results in more detail.

## 2015-03-09 NOTE — Progress Notes (Signed)
Patient Name: [name], [name] Date: 02/26/2015 Gender: Female D.O.B: 07/08/1950 Age (years): 89 Referring Provider: Chesley Mires MD, ABSM Height (inches): 66 Interpreting Physician: Chesley Mires MD, ABSM Weight (lbs): 222 RPSGT: Jonna Coup BMI: 36 MRN: [id number] Neck Size: 16.00  CLINICAL INFORMATION Sleep Study Type: NPSG  Indication for sleep study: To assess for sleep disordered breathing.  Epworth Sleepiness Score: 4  SLEEP STUDY TECHNIQUE As per the AASM Manual for the Scoring of Sleep and Associated Events v2.3 (April 2016) with a hypopnea requiring 4% desaturations.  The channels recorded and monitored were frontal, central and occipital EEG, electrooculogram (EOG), submentalis EMG (chin), nasal and oral airflow, thoracic and abdominal wall motion, anterior tibialis EMG, snore microphone, electrocardiogram, and pulse oximetry.  MEDICATIONS Patient's medications include: reviewed in electronic medical record. Medications self-administered by patient during sleep study : No sleep medicine administered.  SLEEP ARCHITECTURE The study was initiated at 10:41:55 PM and ended at 4:52:00 AM.  Sleep onset time was 28.4 minutes and the sleep efficiency was 73.1%. The total sleep time was 270.6 minutes.  Stage REM latency was 146.0 minutes.  The patient spent 5.54% of the night in stage N1 sleep, 68.78% in stage N2 sleep, 13.86% in stage N3 and 11.82% in REM.  Alpha intrusion was absent.  Supine sleep was 50.07%.  RESPIRATORY PARAMETERS The overall apnea/hypopnea index (AHI) was 4.4 per hour. There were 12 total apneas, including 2 obstructive, 10 central and 0 mixed apneas. There were 8 hypopneas and 1 RERAs.  The AHI during Stage REM sleep was 15.0 per hour.  AHI while supine was 3.5 per hour.  The mean oxygen saturation was 91.03%. The minimum SpO2 during sleep was 83.00%.  Loud snoring was noted during this study.  CARDIAC DATA The 2 lead EKG demonstrated  sinus rhythm. The mean heart rate was 71.91 beats per minute. Other EKG findings include: None.  LEG MOVEMENT DATA The total PLMS were 1 with a resulting PLMS index of 0.22. Associated arousal with leg movement index was 0.0 .  IMPRESSIONS ?While she did have several obstructive respiratory events, these were not frequent enough to qualify for diagnosis of obstructive sleep apnea as her AHI was 4.4 with an SaO2 low of 83%.  She spent 23.6 minutes with an oxygen saturation below 88%, and this would be consistent with sleep related hypoxia/hypoventilation.  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 Avoid alcohol, sedatives and other CNS depressants that may worsen sleep apnea and disrupt normal sleep architecture. Sleep hygiene should be reviewed to assess factors that may improve sleep quality. Weight management and regular exercise should be initiated or continued if appropriate.   Chesley Mires, MD, [place], American Board of Sleep Medicine 03/09/2015, 12:51 PM  NPI: [id number]

## 2015-03-09 NOTE — Telephone Encounter (Signed)
Non-emergent OV recommended. Too many things / complicated to sort out over email.

## 2015-03-10 NOTE — Telephone Encounter (Signed)
Patient notified Overnight oximetry ordered Patient will schedule OV once ONO has been done. Nothing further needed.

## 2015-03-11 DIAGNOSIS — G4734 Idiopathic sleep related nonobstructive alveolar hypoventilation: Secondary | ICD-10-CM | POA: Diagnosis not present

## 2015-03-12 ENCOUNTER — Ambulatory Visit (INDEPENDENT_AMBULATORY_CARE_PROVIDER_SITE_OTHER): Payer: Medicare Other | Admitting: Family Medicine

## 2015-03-12 ENCOUNTER — Telehealth: Payer: Self-pay | Admitting: *Deleted

## 2015-03-12 ENCOUNTER — Encounter: Payer: Self-pay | Admitting: Family Medicine

## 2015-03-12 VITALS — BP 110/60 | HR 74 | Temp 98.9°F | Ht 66.0 in | Wt 225.5 lb

## 2015-03-12 DIAGNOSIS — L0293 Carbuncle, unspecified: Secondary | ICD-10-CM

## 2015-03-12 DIAGNOSIS — R42 Dizziness and giddiness: Secondary | ICD-10-CM

## 2015-03-12 DIAGNOSIS — K5900 Constipation, unspecified: Secondary | ICD-10-CM | POA: Diagnosis not present

## 2015-03-12 DIAGNOSIS — K219 Gastro-esophageal reflux disease without esophagitis: Secondary | ICD-10-CM | POA: Diagnosis not present

## 2015-03-12 MED ORDER — OMEPRAZOLE-SODIUM BICARBONATE 40-1100 MG PO CAPS: 1.0000 | ORAL_CAPSULE | Freq: Every day | ORAL | Status: DC

## 2015-03-12 NOTE — Progress Notes (Signed)
Pre visit review using our clinic review tool, if applicable. No additional management support is needed unless otherwise documented below in the visit note. 

## 2015-03-12 NOTE — Telephone Encounter (Signed)
Received fax from [facility] asking for prior authorization for Zegerid 40-1100 mg.  PA completed on CoverMyMeds.  Will await decision.

## 2015-03-12 NOTE — Progress Notes (Signed)
Dr. Frederico Hamman T. Lirio Bach, MD, [facility] [place], [address] Phone: [phone] Fax: [phone]  03/12/2015  Patient: [name], MRN: [id number], DOB: December 23, 1950, 64 y.o.  Primary Physician:  Owens Loffler, MD  Chief Complaint: Gastrophageal Reflux; Recurrent Skin Infections; and Dizziness  Subjective:   [name] is a 64 y.o. very pleasant female patient who presents with the following:  Reflux, in the center of her chest and keeps burping to try to relief it, taking OTC Tums and Rolaids.  Taking Protonix 40 mg BID.  (was taking Dexilant) Took Aciphex Prevacid Nexium Prilosec ?? Unsure about zegerid.   Lightheadedness, and it will come and go.   Skin infections, too. Ongoing, improve with neosporin.   Constipation. Stool softener, takes sennakot-S.   Past Medical History, Surgical History, Social History, Family History, Problem List, Medications, and Allergies have been reviewed and updated if relevant.  Patient Active Problem List   Diagnosis Date Noted  . Type 2 diabetes mellitus with diabetic neuropathy 04/07/2013    Priority: High  . Postlaminectomy syndrome, cervical region 08/29/2011    Priority: High  . FIBROMYALGIA 01/11/2007    Priority: High  . Depression, major, recurrent, in partial remission 09/22/2008    Priority: Medium  . Hypothyroidism 05/29/2007    Priority: Medium  . HYPERLIPIDEMIA 05/29/2007    Priority: Medium  . Essential hypertension 11/13/2006    Priority: Medium  . Sleep related hypoxia 03/09/2015  . Generalized hyperhidrosis 04/17/2014  . Obesity (BMI 30-39.9) 08/23/2013  . 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 FATIGUE 05/04/2009  . TRANSAMINASES, SERUM, ELEVATED 12/24/2008  . ALLERGIC RHINITIS 09/22/2008  . DEFICIENCY, VITAMIN D NOS 01/11/2007  . Other B-complex deficiencies 11/13/2006  . GERD 11/13/2006    Past  Medical History  Diagnosis Date  . GERD (gastroesophageal reflux disease)   . Unspecified essential hypertension   . Fibromyalgia   . B12 deficiency   . Depression   . Irritable bowel syndrome   . Hyperlipemia   . Migraines   . HYPOTHYROIDISM 05/29/2007  . Type II or unspecified type diabetes mellitus with neurological manifestations, not stated as uncontrolled 04/07/2013  . Generalized hyperhidrosis 04/17/2014    Past Surgical History  Procedure Laterality Date  . Appendectomy  1988  . Cholecystectomy  2005  . Tubal ligation  1979  . Tonsillectomy and adenoidectomy    . Anterior cervical decomp/discectomy fusion  2009, 1993    first procedure in ~ 1993/Dr Botero  . Nasal sinus surgery  2006  . Abdominal hysterectomy  1988    Social History   Social History  . Marital Status: Married    Spouse Name: N/A  . Number of Children: N/A  . Years of Education: N/A   Occupational History  . Bookkeeper    Social History Main Topics  . Smoking status: Never Smoker   . Smokeless tobacco: Never Used  . Alcohol Use: No  . Drug Use: No  . Sexual Activity: Not on file   Other Topics Concern  . Not on file   Social History Narrative    Family History  Problem Relation Age of Onset  . Breast cancer Mother   . Diabetes Mother   . Heart disease Mother   . Diabetes Father   . Heart disease Father   . Stomach cancer Maternal Uncle   . Breast cancer Paternal Aunt   . Colon cancer Neg  Hx   . Emphysema Father   . Allergies Mother   . Allergies Father     Allergies  Allergen Reactions  . Rizatriptan Benzoate Anaphylaxis    Throat and Tongue Swelling Closed  . Sumatriptan Other (See Comments)    Throat closed & Tongue Swelling  . Tizanidine Shortness Of Breath  . Clarithromycin   . Cyclobenzaprine Hcl     REACTION: THROAT CLOSED,TOUNGE SWELLING  . Doxycycline   . Duloxetine   . Metronidazole   . Naproxen Sodium Hives  . Oxycodone Hcl Diarrhea    Vomiting  .  Penicillins     TOLERATES CEPHALOSPORINS  . Rosuvastatin     REACTION: MUSCLE ACHES  . Tramadol   . Venlafaxine Hives    Medication list reviewed and updated in full in [place].   GEN: No acute illnesses, no fevers, chills. GI: No n/v/d, eating normally Pulm: No SOB Interactive and getting along well at home.  Otherwise, ROS is as per the HPI.  Objective:   BP 110/60 mmHg  Pulse 74  Temp(Src) 98.9 F (37.2 C) (Oral)  Ht _0  (1.676 m)  Wt 225 lb 8 oz (102.286 kg)  BMI 36.41 kg/m2  GEN: WDWN, NAD, Non-toxic, A & O x 3 HEENT: Atraumatic, Normocephalic. Neck supple. No masses, No LAD. Ears and Nose: No external deformity. CV: RRR, No M/G/R. No JVD. No thrill. No extra heart sounds. PULM: CTA B, no wheezes, crackles, rhonchi. No retractions. No resp. distress. No accessory muscle use. EXTR: No c/c/e NEURO Normal gait.  PSYCH: Normally interactive. Conversant. Not depressed or anxious appearing.  Calm demeanor.   Laboratory and Imaging Data:  Assessment and Plan:   Gastroesophageal reflux disease, esophagitis presence not specified  Lightheaded  Recurrent boils  Constipation, unspecified constipation type  Many year history of GERD with failure of multiple medications.  Trial of Zegerid.  Recurrent boils, we have done intranasal Bactroban as well as chlorhexidine washes, and she still has some occasional boils.  These are improving without any systemic antibiotics.  Constipation secondary to chronic opioid use.  Reviewed basic care, continue Senokot 2 tablets p.o. B.i.d., add MiraLAX as needed.  Follow-up: No Follow-up on file.  New Prescriptions   OMEPRAZOLE-SODIUM BICARBONATE (ZEGERID) 40-1100 MG PER CAPSULE    Take 1 capsule by mouth daily before breakfast.   No orders of the defined types were placed in this encounter.    Signed,  Maud Deed. Jahnavi Muratore, MD   Patient's Medications  New Prescriptions   OMEPRAZOLE-SODIUM BICARBONATE (ZEGERID)  40-1100 MG PER CAPSULE    Take 1 capsule by mouth daily before breakfast.  Previous Medications   ASPIRIN 81 MG TABLET    Take 81 mg by mouth daily.     B COMPLEX VITAMINS (B COMPLEX 100 PO)    Take 1 tablet by mouth daily.   BIOTIN 1000 MCG TABLET    Take 1,000 mcg by mouth daily.   CALCIUM CARBONATE (CALCIUM 600 PO)    Take 600 mg by mouth 2 (two) times daily.    CETIRIZINE (ZYRTEC) 10 MG TABLET    Take 10 mg by mouth daily.     CHOLECALCIFEROL (VITAMIN D) 2000 UNITS TABLET    Take 2,000 Units by mouth daily.     CITALOPRAM (CELEXA) 40 MG TABLET    Take 1 tablet (40 mg total) by mouth daily.   CLINDAMYCIN (CLEOCIN) 300 MG CAPSULE    Take 600 mg by mouth. before dental appointments  COENZYME Q-10 100 MG CAPSULE    Take 100 mg by mouth daily.   DICLOFENAC SODIUM (VOLTAREN) 1 % GEL    Apply 4 g topically 4 (four) times daily.   DOCUSATE SODIUM (COLACE) 100 MG CAPSULE    Take 150 mg by mouth at bedtime.    ETODOLAC PO    Take 1 tablet by mouth daily as needed.   GABAPENTIN (NEURONTIN) 600 MG TABLET    Take 1 tablet (600 mg total) by mouth 3 (three) times daily.   GLUCOSE MONITORING KIT (FREESTYLE) MONITORING KIT    1 each by Does not apply route as needed for other. DISPENSE CHEAPEST KIT, PER PATIENT INSURANCE, BID TESTING, DX 250.00   LEVOTHYROXINE (SYNTHROID, LEVOTHROID) 50 MCG TABLET    Take 1 tablet (50 mcg total) by mouth daily.   LORATADINE (CLARITIN) 10 MG TABLET    Take 10 mg by mouth daily.   MELATON-THEAN-CHAM-PASSF-LBALM (MELATONIN + L-THEANINE PO)    Take by mouth at bedtime. 56m/25mg tablet    METFORMIN (GLUCOPHAGE) 500 MG TABLET    TAKE 1 TABLET BY MOUTH TWICE A DAY WITH A MEAL   METHOCARBAMOL (ROBAXIN) 500 MG TABLET    TAKE ONE (1) TABLET BY MOUTH EVERY EIGHTHOURS AS NEEDED FOR MUSCLE SPASMS   MIRTAZAPINE (REMERON) 15 MG TABLET    Take 1 tablet (15 mg total) by mouth at bedtime.   MORPHINE (MS CONTIN) 15 MG 12 HR TABLET    Take 1 tablet (15 mg total) by mouth 3 (three) times  daily.   OXYBUTYNIN (DITROPAN-XL) 10 MG 24 HR TABLET    Take 1 tablet (10 mg total) by mouth at bedtime.   PANTOPRAZOLE (PROTONIX) 40 MG TABLET    Take 1 tablet (40 mg total) by mouth 2 (two) times daily.   SENNA-DOCUSATE (PERI-COLACE) 8.6-50 MG PER TABLET    Take 4 tablets by mouth at bedtime as needed for mild constipation.   Modified Medications   No medications on file  Discontinued Medications   ASCORBIC ACID (VITAMIN C) 1000 MG TABLET    Take 1,000 mg by mouth daily.    B COMPLEX VITAMINS (B COMPLEX 100 PO)    Take 1 tablet by mouth daily.   BIOTIN PO    Take 1 tablet by mouth daily.

## 2015-03-13 NOTE — Telephone Encounter (Signed)
Zegerig 40-1100 mg approved for non-formulary exception through 06/17/2015 under Medicare Part D.  [place] notified by fax.

## 2015-03-24 ENCOUNTER — Telehealth: Payer: Self-pay | Admitting: Pulmonary Disease

## 2015-03-24 DIAGNOSIS — G4734 Idiopathic sleep related nonobstructive alveolar hypoventilation: Secondary | ICD-10-CM

## 2015-03-24 NOTE — Telephone Encounter (Signed)
ONO with RA 03/11/15 >> test time 8 hrs 50 min.  Baseline SpO2 92%, low SpO2 78%.  Spent 24 min with SpO2 < 88%.   Will have my nurse inform pt that ONO confirms that her oxygen level gets low at night.  She needs to be set up with 2 liters oxygen at night while asleep.  She can have this set up first and then have ROV, or she can have ROV prior to set up if that is her preference.

## 2015-03-25 ENCOUNTER — Telehealth: Payer: Self-pay | Admitting: Pulmonary Disease

## 2015-03-25 DIAGNOSIS — J9610 Chronic respiratory failure, unspecified whether with hypoxia or hypercapnia: Secondary | ICD-10-CM | POA: Diagnosis not present

## 2015-03-25 DIAGNOSIS — E662 Morbid (severe) obesity with alveolar hypoventilation: Secondary | ICD-10-CM

## 2015-03-25 NOTE — Telephone Encounter (Signed)
Patient notified. Order entered for O2. Patient will call to schedule appointment once O2 has been set up. Nothing further needed.

## 2015-03-25 NOTE — Telephone Encounter (Signed)
Called spoke with Jeani Hawking from [facility]. She reports the DX used for pt O2--nocturnal hypoxia will not work. Asked if sleep related hypoxia or sleep disordered breathing would work and was advised insurance will not take those codes either. Dr. Halford Chessman is there another code we can use? thanks

## 2015-03-26 DIAGNOSIS — E662 Morbid (severe) obesity with alveolar hypoventilation: Secondary | ICD-10-CM | POA: Insufficient documentation

## 2015-03-26 NOTE — Telephone Encounter (Signed)
Called spoke with Maudie Mercury from [facility] and advised her of new DX code. Nothing further needed

## 2015-03-26 NOTE — Telephone Encounter (Signed)
Use obesity hypoventilation syndrome (ICD 10 code E66.2).

## 2015-04-02 ENCOUNTER — Telehealth: Payer: Self-pay | Admitting: Pulmonary Disease

## 2015-04-02 DIAGNOSIS — J9611 Chronic respiratory failure with hypoxia: Secondary | ICD-10-CM

## 2015-04-02 NOTE — Telephone Encounter (Signed)
lmtcb x1 for Lynn 

## 2015-04-02 NOTE — Telephone Encounter (Signed)
Alicia Mires, MD at 03/26/2015 7:41 AM     Status: Signed       Expand All Collapse All   Use obesity hypoventilation syndrome (ICD 10 code E66.2).       Spoke with [name]. Advised her of the dx code that [facility] wants to use. She states that this dx code won't work. [name] is going to refax the form with the dx codes that will cover the oxygen order. Will await fax.

## 2015-04-02 NOTE — Telephone Encounter (Signed)
[name] with [facility] returned call.

## 2015-04-06 NOTE — Telephone Encounter (Signed)
Called spoke with Jeani Hawking from [place]. She faxed over a list that will cover O2. This has been placed in VS look at. Please advise thanks

## 2015-04-07 DIAGNOSIS — J9611 Chronic respiratory failure with hypoxia: Secondary | ICD-10-CM | POA: Insufficient documentation

## 2015-04-07 NOTE — Telephone Encounter (Signed)
Called spoke with Jeani Hawking. Gave VO for new DX code. Nothing further needed

## 2015-04-07 NOTE — Telephone Encounter (Signed)
Can use chronic respiratory failure with hypoxia (ICD 10 code J96.11).

## 2015-04-08 ENCOUNTER — Encounter: Payer: Self-pay | Admitting: Registered Nurse

## 2015-04-08 ENCOUNTER — Encounter: Payer: Medicare Other | Attending: Physical Medicine & Rehabilitation | Admitting: Registered Nurse

## 2015-04-08 VITALS — BP 146/72 | HR 64 | Resp 14

## 2015-04-08 DIAGNOSIS — Z5181 Encounter for therapeutic drug level monitoring: Secondary | ICD-10-CM | POA: Insufficient documentation

## 2015-04-08 DIAGNOSIS — M545 Low back pain, unspecified: Secondary | ICD-10-CM

## 2015-04-08 DIAGNOSIS — M797 Fibromyalgia: Secondary | ICD-10-CM

## 2015-04-08 DIAGNOSIS — G894 Chronic pain syndrome: Secondary | ICD-10-CM | POA: Insufficient documentation

## 2015-04-08 DIAGNOSIS — Z79899 Other long term (current) drug therapy: Secondary | ICD-10-CM | POA: Insufficient documentation

## 2015-04-08 MED ORDER — MORPHINE SULFATE ER 15 MG PO TBCR: 15.0000 mg | EXTENDED_RELEASE_TABLET | Freq: Three times a day (TID) | ORAL | Status: DC

## 2015-04-08 NOTE — Progress Notes (Signed)
Subjective:    Patient ID: [name], female    DOB: [birth year], 64 y.o.   MRN: [id number]  HPI: [name] is a 64 year old female who returns for follow up for chronic pain and medication refill. She says her pain is located in her mid-back.She rates her pain 5. Her current exercise regime is performing stretching exercises and walking. Also states she had a sleep study on 02/26/2015 was diagnosed with hypoxia, she was prescribed oxygen 2 liters nasal cannula at HS. She states "she feels better since wearing oxygen.  Pain Inventory Average Pain 5 Pain Right Now 5 My pain is constant, burning and aching  In the last 24 hours, has pain interfered with the following? General activity 7 Relation with others 7 Enjoyment of life 7 What TIME of day is your pain at its worst? daytime, night Sleep (in general) Fair  Pain is worse with: walking, bending, sitting, inactivity, standing and some activites Pain improves with: rest, heat/ice, therapy/exercise, pacing activities, medication, TENS and injections Relief from Meds: 5  Mobility walk without assistance how many minutes can you walk? 10-15 ability to climb steps?  yes do you drive?  yes Do you have any goals in this area?  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  yes  Neuro/Psych bladder control problems weakness numbness tingling spasms dizziness depression  Prior Studies Any changes since last visit?  no bone scan x-rays CT/MRI nerve study  Physicians involved in your care Any changes since last visit?  no   Family History  Problem Relation Age of Onset  . Breast cancer Mother   . Diabetes Mother   . Heart disease Mother   . Diabetes Father   . Heart disease Father   . Stomach cancer Maternal Uncle   . Breast cancer  Paternal Aunt   . Colon cancer Neg Hx   . Emphysema Father   . Allergies Mother   . Allergies Father    Social History   Social History  . Marital Status: Married    Spouse Name: N/A  . Number of Children: N/A  . Years of Education: N/A   Occupational History  . Bookkeeper    Social History Main Topics  . Smoking status: Never Smoker   . Smokeless tobacco: Never Used  . Alcohol Use: No  . Drug Use: No  . Sexual Activity: Not Asked   Other Topics Concern  . None   Social History Narrative   Past Surgical History  Procedure Laterality Date  . Appendectomy  1988  . Cholecystectomy  2005  . Tubal ligation  1979  . Tonsillectomy and adenoidectomy    . Anterior cervical decomp/discectomy fusion  2009, 1993    first procedure in ~ 1993/Dr Botero  . Nasal sinus surgery  2006  . Abdominal hysterectomy  1988   Past Medical History  Diagnosis Date  . GERD (gastroesophageal reflux disease)   . Unspecified essential hypertension   . Fibromyalgia   . B12 deficiency   . Depression   . Irritable bowel syndrome   . Hyperlipemia   . Migraines   . HYPOTHYROIDISM 05/29/2007  . Type II or unspecified type diabetes mellitus with neurological manifestations, not stated as uncontrolled 04/07/2013  . Generalized hyperhidrosis 04/17/2014   BP 146/72 mmHg  Pulse 64  Resp 14  SpO2 98%  Opioid Risk Score:   Fall Risk Score:  `1  Depression screen PHQ 2/9  Depression screen [facility] 2/9 02/04/2015 09/11/2014 10/15/2013  Decreased Interest 0 2 0  Down, Depressed, Hopeless 0 0 0  PHQ - 2 Score 0 2 0  Altered sleeping - 0 -  Tired, decreased energy - 2 -  Change in appetite - 0 -  Feeling bad or failure about yourself  - 0 -  Trouble concentrating - 1 -  Moving slowly or fidgety/restless - 0 -  Suicidal thoughts - 0 -  PHQ-9 Score - 5 -     Review of Systems  Respiratory: Positive for cough.   Cardiovascular: Positive for leg swelling.  Gastrointestinal: Positive for  constipation.  Genitourinary: Positive for urgency.  Neurological: Positive for dizziness, weakness and numbness.       Tingling Spasms   Psychiatric/Behavioral: Positive for dysphoric mood.  All other systems reviewed and are negative.      Objective:   Physical Exam  Constitutional: She is oriented to person, place, and time. She appears well-developed and well-nourished.  HENT:  Head: Normocephalic and atraumatic.  Neck: Normal range of motion. Neck supple.  Cardiovascular: Normal rate and regular rhythm.   Pulmonary/Chest: Effort normal and breath sounds normal.  Musculoskeletal:  Normal Muscle Bulk and Muscle Testing Reveals: Upper Extremities: Full ROM and Muscle Strength 5/5 Back without spinal or paraspinal tenderness Lower Extremities: Full ROM and Muscle Strength 5/5 Arises from chair with ease Narrow Based Gait  Neurological: She is alert and oriented to person, place, and time.  Skin: Skin is warm and dry.  Psychiatric: She has a normal mood and affect.  Nursing note and vitals reviewed.         Assessment & Plan:  1. Cervical postlaminectomy syndrome: with neck and shoulder pain. Continue exercise routine and using heat therapy. Continue MS Contin 15 mg one tablet three times a day #90.  2. Fibromyalgia: Continue with activity and exercise regime.  15 minutes of face to face patient care time was spent during this visit. All questions were encouraged and answered.   F/U in 1 month

## 2015-04-18 ENCOUNTER — Other Ambulatory Visit: Payer: Self-pay | Admitting: Family Medicine

## 2015-04-20 ENCOUNTER — Other Ambulatory Visit: Payer: Self-pay | Admitting: *Deleted

## 2015-04-20 MED ORDER — OMEPRAZOLE-SODIUM BICARBONATE 40-1100 MG PO CAPS: 1.0000 | ORAL_CAPSULE | Freq: Every day | ORAL | Status: DC

## 2015-04-20 MED ORDER — METFORMIN HCL 500 MG PO TABS: ORAL_TABLET | ORAL | Status: DC

## 2015-04-21 ENCOUNTER — Encounter: Payer: Self-pay | Admitting: Pulmonary Disease

## 2015-04-22 MED ORDER — OMEPRAZOLE 40 MG PO CPDR: 40.0000 mg | DELAYED_RELEASE_CAPSULE | Freq: Every day | ORAL | Status: DC

## 2015-04-22 NOTE — Telephone Encounter (Addendum)
Spoke with [name].  She never filled the Zegerid prescription.  She was told by a pharmacist that taking omeprazole withTums is the same thing and not to spend the money for the [place].  Refills for omeprazole sent to [facility].  Patient is currently on Protonix and once that runs out she will start the Omeprazole with Tums.

## 2015-04-22 NOTE — Addendum Note (Signed)
Addended by: Carter Kitten on: 04/22/2015 03:39 PM   Modules accepted: Orders, Medications

## 2015-04-24 DIAGNOSIS — J9610 Chronic respiratory failure, unspecified whether with hypoxia or hypercapnia: Secondary | ICD-10-CM | POA: Diagnosis not present

## 2015-04-29 ENCOUNTER — Other Ambulatory Visit: Payer: Self-pay

## 2015-04-29 DIAGNOSIS — Z1231 Encounter for screening mammogram for malignant neoplasm of breast: Secondary | ICD-10-CM

## 2015-05-08 ENCOUNTER — Encounter: Payer: Self-pay | Admitting: Physical Medicine & Rehabilitation

## 2015-05-08 ENCOUNTER — Encounter: Payer: Medicare Other | Attending: Physical Medicine & Rehabilitation

## 2015-05-08 ENCOUNTER — Ambulatory Visit (HOSPITAL_BASED_OUTPATIENT_CLINIC_OR_DEPARTMENT_OTHER): Payer: Medicare Other | Admitting: Physical Medicine & Rehabilitation

## 2015-05-08 VITALS — BP 136/75 | HR 65

## 2015-05-08 DIAGNOSIS — G894 Chronic pain syndrome: Secondary | ICD-10-CM | POA: Insufficient documentation

## 2015-05-08 DIAGNOSIS — Z5181 Encounter for therapeutic drug level monitoring: Secondary | ICD-10-CM | POA: Diagnosis not present

## 2015-05-08 DIAGNOSIS — M961 Postlaminectomy syndrome, not elsewhere classified: Secondary | ICD-10-CM | POA: Diagnosis not present

## 2015-05-08 DIAGNOSIS — Z79899 Other long term (current) drug therapy: Secondary | ICD-10-CM | POA: Insufficient documentation

## 2015-05-08 MED ORDER — MORPHINE SULFATE ER 15 MG PO TBCR: 15.0000 mg | EXTENDED_RELEASE_TABLET | Freq: Three times a day (TID) | ORAL | Status: DC

## 2015-05-08 NOTE — Progress Notes (Signed)
Subjective:    Patient ID: [name], female    DOB: [birth year], 64 y.o.   MRN: [id number]  HPI 28 female with chronic neck pain. She had a C4-C5 anterior cervical decompression and fusion in 1993 and a C5-C6 ACDF in 2009. She has been on  Morphine sulfate for chronic pain. Stable dose 15 mg 3 times per day. Patient takes 5 Senokot tablets per day as well as one stool softener tablet per day for her constipation. Having a daily bowel movement.    Pain Inventory Average Pain 5 Pain Right Now 6 My pain is constant, burning and aching  In the last 24 hours, has pain interfered with the following? General activity 5 Relation with others 5 Enjoyment of life 5 What TIME of day is your pain at its worst? Morning and Night Sleep (in general) Fair  Pain is worse with: walking, bending, sitting, inactivity, standing and some activites Pain improves with: rest, heat/ice, therapy/exercise, pacing activities, medication, TENS and injections Relief from Meds: 5  Mobility walk without assistance how many minutes can you walk? 10-15 ability to climb steps?  yes do you drive?  yes Do you have any goals in this area?  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  yes  Neuro/Psych bladder control problems weakness numbness tremor tingling spasms dizziness depression  Prior Studies Any changes since last visit?  no  Physicians involved in your care Any changes since last visit?  no   Family History  Problem Relation Age of Onset  . Breast cancer Mother   . Diabetes Mother   . Heart disease Mother   . Diabetes Father   . Heart disease Father   . Stomach cancer Maternal Uncle   . Breast cancer Paternal Aunt   . Colon cancer Neg Hx   . Emphysema Father   . Allergies Mother   . Allergies Father     Social History   Social History  . Marital Status: Married    Spouse Name: N/A  . Number of Children: N/A  . Years of Education: N/A   Occupational History  . Bookkeeper    Social History Main Topics  . Smoking status: Never Smoker   . Smokeless tobacco: Never Used  . Alcohol Use: No  . Drug Use: No  . Sexual Activity: Not Asked   Other Topics Concern  . None   Social History Narrative   Past Surgical History  Procedure Laterality Date  . Appendectomy  1988  . Cholecystectomy  2005  . Tubal ligation  1979  . Tonsillectomy and adenoidectomy    . Anterior cervical decomp/discectomy fusion  2009, 1993    first procedure in ~ 1993/Dr Botero  . Nasal sinus surgery  2006  . Abdominal hysterectomy  1988   Past Medical History  Diagnosis Date  . GERD (gastroesophageal reflux disease)   . Unspecified essential hypertension   . Fibromyalgia   . B12 deficiency   . Depression   . Irritable bowel syndrome   . Hyperlipemia   . Migraines   . HYPOTHYROIDISM 05/29/2007  . Type II or unspecified type diabetes mellitus with neurological manifestations, not stated as uncontrolled 04/07/2013  . Generalized hyperhidrosis 04/17/2014   BP 136/75 mmHg  Pulse 65  SpO2 99%  Opioid Risk Score:  Fall Risk Score:  `1  Depression screen PHQ 2/9  Depression screen [facility] 2/9 02/04/2015 09/11/2014 10/15/2013  Decreased Interest 0 2 0  Down, Depressed, Hopeless 0 0 0  PHQ - 2 Score 0 2 0  Altered sleeping - 0 -  Tired, decreased energy - 2 -  Change in appetite - 0 -  Feeling bad or failure about yourself  - 0 -  Trouble concentrating - 1 -  Moving slowly or fidgety/restless - 0 -  Suicidal thoughts - 0 -  PHQ-9 Score - 5 -     Review of Systems  Gastrointestinal: Positive for constipation.  Genitourinary:       Bladder Control Problems  Musculoskeletal:       Spasms  Neurological: Positive for dizziness, tremors, weakness and numbness.  Psychiatric/Behavioral:        Depression  All other systems reviewed and are negative.      Objective:   Physical Exam  Constitutional: She is oriented to person, place, and time. She appears well-developed and well-nourished.  HENT:  Head: Normocephalic and atraumatic.  Neurological: She is alert and oriented to person, place, and time. She has normal strength.  Psychiatric: She has a normal mood and affect.  Nursing note and vitals reviewed.   Motor strength is 5/5 bilateral hip flexor and extensor ankle dorsiflexor 5/5 bilateral deltoid, biceps, triceps, crit Normal sensation bilateral upper extremities. Cervical spine 50% range flexion extension lateral bending and rotation bilaterally. No tenderness palpation along the cervical musculature.     Assessment & Plan:  1. Cervical postlaminectomy syndrome with history of C4-5 as well as C5-C6 ACDF. No evidence of radiculopathy. Patient has been on a stable dosage of morphine sulfate a total of 45 mg per day. Continue current dosing. Continue morphine sulfate 15 mg 3 times per day extended release Continue opioid monitoring program. This consists of regular clinic visits, examinations, urine drug screen, pill counts as well as use of [place] controlled substance reporting System. Last urine toxicology 02/04/2015 was appropriate and consistent 2. 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  Continue stool softener with laxative. Agree with adding MiraLAX if this is not effective.  RN Practitioner visit one month

## 2015-05-12 ENCOUNTER — Telehealth: Payer: Self-pay

## 2015-05-12 NOTE — Telephone Encounter (Signed)
[facility] left v/m wanting to ck on status of clarification request sent on 05/04/15; combination of citalopram > 20 mg daily and omeprazole may cause QT prolongation and [facility] wants to know if can change to pantoprazole. Request cb.use ref # [id number].

## 2015-05-13 ENCOUNTER — Telehealth: Payer: Self-pay

## 2015-05-13 MED ORDER — GLUCOSE BLOOD VI STRP: ORAL_STRIP | Status: DC

## 2015-05-13 MED ORDER — GE100 BLOOD GLUCOSE SYSTEM W/DEVICE KIT: PACK | Status: DC

## 2015-05-13 MED ORDER — LANCETS MISC: Status: DC

## 2015-05-13 NOTE — Telephone Encounter (Signed)
Brad ( Pharmacist) at [facility] notified as instructed by telephone.

## 2015-05-13 NOTE — Telephone Encounter (Signed)
Bea at [facility] requesting new GE meter,strips and lancets. Sent electronically to [place].10/20/14 last f/u appt.

## 2015-05-13 NOTE — Telephone Encounter (Signed)
This patient has tried all PPI's and has had severe problems with GERD. Currently well controlled.   QTc is only 390  Potential benefits would seem to outweigh risks in this case to maintain this extremely difficult patient to maintain GERD control..  I do not authorize change.

## 2015-05-25 DIAGNOSIS — J9610 Chronic respiratory failure, unspecified whether with hypoxia or hypercapnia: Secondary | ICD-10-CM | POA: Diagnosis not present

## 2015-05-26 ENCOUNTER — Telehealth: Payer: Self-pay | Admitting: Family Medicine

## 2015-05-26 MED ORDER — GLUCOSE BLOOD VI STRP: ORAL_STRIP | Status: DC

## 2015-05-26 MED ORDER — ACCU-CHEK AVIVA PLUS W/DEVICE KIT: PACK | Status: DC

## 2015-05-26 NOTE — Addendum Note (Signed)
Addended by: Carter Kitten on: 05/26/2015 10:17 AM   Modules accepted: Orders

## 2015-05-26 NOTE — Telephone Encounter (Signed)
Patient returned [name]'s call. °

## 2015-05-26 NOTE — Telephone Encounter (Signed)
Received fax from [facility] requesting Rx for [name] and Strips.  Per Patient's chart she has a [name] meter.  Spoke with [name] who states the [name] meter broke and she can get the [name] from [facility] with 0 copay.  Prescriptions sent to [facility] for Accu-Chek [name] Plus Meter Kit and Test strips.

## 2015-05-27 ENCOUNTER — Other Ambulatory Visit: Payer: Self-pay

## 2015-05-27 DIAGNOSIS — R739 Hyperglycemia, unspecified: Secondary | ICD-10-CM

## 2015-05-28 ENCOUNTER — Other Ambulatory Visit (INDEPENDENT_AMBULATORY_CARE_PROVIDER_SITE_OTHER): Payer: Medicare Other

## 2015-05-28 DIAGNOSIS — R7309 Other abnormal glucose: Secondary | ICD-10-CM

## 2015-05-28 DIAGNOSIS — R739 Hyperglycemia, unspecified: Secondary | ICD-10-CM

## 2015-05-28 LAB — MICROALBUMIN / CREATININE URINE RATIO
Creatinine,U: 93.6 mg/dL
Microalb Creat Ratio: 0.9 mg/g (ref 0.0–30.0)
Microalb, Ur: 0.8 mg/dL (ref 0.0–1.9)

## 2015-06-01 ENCOUNTER — Encounter: Payer: Self-pay | Admitting: Family Medicine

## 2015-06-02 MED ORDER — ACCU-CHEK SOFTCLIX LANCET DEV MISC: Status: DC

## 2015-06-02 MED ORDER — ACCU-CHEK SOFT TOUCH LANCETS MISC: Status: DC

## 2015-06-05 ENCOUNTER — Encounter: Payer: Self-pay | Admitting: Registered Nurse

## 2015-06-05 ENCOUNTER — Encounter: Payer: Medicare Other | Attending: Physical Medicine & Rehabilitation | Admitting: Registered Nurse

## 2015-06-05 VITALS — BP 134/52 | HR 75

## 2015-06-05 DIAGNOSIS — Z79899 Other long term (current) drug therapy: Secondary | ICD-10-CM | POA: Diagnosis not present

## 2015-06-05 DIAGNOSIS — IMO0001 Reserved for inherently not codable concepts without codable children: Secondary | ICD-10-CM

## 2015-06-05 DIAGNOSIS — M791 Myalgia: Secondary | ICD-10-CM

## 2015-06-05 DIAGNOSIS — Z5181 Encounter for therapeutic drug level monitoring: Secondary | ICD-10-CM | POA: Diagnosis not present

## 2015-06-05 DIAGNOSIS — M797 Fibromyalgia: Secondary | ICD-10-CM | POA: Diagnosis not present

## 2015-06-05 DIAGNOSIS — M545 Low back pain, unspecified: Secondary | ICD-10-CM

## 2015-06-05 DIAGNOSIS — M609 Myositis, unspecified: Secondary | ICD-10-CM

## 2015-06-05 DIAGNOSIS — G894 Chronic pain syndrome: Secondary | ICD-10-CM | POA: Diagnosis not present

## 2015-06-05 DIAGNOSIS — M25562 Pain in left knee: Secondary | ICD-10-CM

## 2015-06-05 MED ORDER — MORPHINE SULFATE ER 15 MG PO TBCR: 15.0000 mg | EXTENDED_RELEASE_TABLET | Freq: Three times a day (TID) | ORAL | Status: DC

## 2015-06-05 NOTE — Progress Notes (Signed)
Subjective:    Patient ID: [name], female    DOB: 1950/06/29, 64 y.o.   MRN: [id number]  HPI: [name] is a 64 year old female who returns for follow up for chronic pain and medication refill. She says her pain is located in her right shoulder, mid-back and left knee. Also states she went to the beach over Thanksgiving weekend and admits she overworked herself. Educated on performing activity as tolerated she verbalizes understanding.  She rates her pain 4. Her current exercise regime is performing stretching exercises and walking.  Pain Inventory Average Pain 5 Pain Right Now 4 My pain is constant, burning and aching  In the last 24 hours, has pain interfered with the following? General activity 5 Relation with others 6 Enjoyment of life 5 What TIME of day is your pain at its worst? Morning and Night Sleep (in general) Fair  Pain is worse with: walking, bending, sitting, inactivity, standing and some activites Pain improves with: rest, heat/ice, therapy/exercise, pacing activities, medication, TENS and injections Relief from Meds: 5  Mobility walk without assistance how many minutes can you walk? 10-15 ability to climb steps?  yes do you drive?  yes Do you have any goals in this area?  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  yes  Neuro/Psych bladder control problems weakness numbness tremor tingling dizziness depression  Prior Studies Any changes since last visit?  no  Physicians involved in your care Any changes since last visit?  no   Family History  Problem Relation Age of Onset  . Breast cancer Mother   . Diabetes Mother   . Heart disease Mother   . Diabetes Father   . Heart disease Father   . Stomach cancer Maternal Uncle   . Breast cancer Paternal Aunt    . Colon cancer Neg Hx   . Emphysema Father   . Allergies Mother   . Allergies Father    Social History   Social History  . Marital Status: Married    Spouse Name: N/A  . Number of Children: N/A  . Years of Education: N/A   Occupational History  . Bookkeeper    Social History Main Topics  . Smoking status: Never Smoker   . Smokeless tobacco: Never Used  . Alcohol Use: No  . Drug Use: No  . Sexual Activity: Not Asked   Other Topics Concern  . None   Social History Narrative   Past Surgical History  Procedure Laterality Date  . Appendectomy  1988  . Cholecystectomy  2005  . Tubal ligation  1979  . Tonsillectomy and adenoidectomy    . Anterior cervical decomp/discectomy fusion  2009, 1993    first procedure in ~ 1993/Dr Botero  . Nasal sinus surgery  2006  . Abdominal hysterectomy  1988   Past Medical History  Diagnosis Date  . GERD (gastroesophageal reflux disease)   . Unspecified essential hypertension   . Fibromyalgia   . B12 deficiency   . Depression   . Irritable bowel syndrome   . Hyperlipemia   . Migraines   . HYPOTHYROIDISM 05/29/2007  . Type II or unspecified type diabetes mellitus with neurological manifestations, not stated as uncontrolled 04/07/2013  . Generalized hyperhidrosis 04/17/2014   BP 134/52 mmHg  Pulse 75  SpO2 97%  Opioid Risk Score:   Fall Risk Score:  `1  Depression screen PHQ 2/9  Depression screen [facility] 2/9 02/04/2015 09/11/2014 10/15/2013  Decreased Interest 0 2 0  Down, Depressed, Hopeless 0 0 0  PHQ - 2 Score 0 2 0  Altered sleeping - 0 -  Tired, decreased energy - 2 -  Change in appetite - 0 -  Feeling bad or failure about yourself  - 0 -  Trouble concentrating - 1 -  Moving slowly or fidgety/restless - 0 -  Suicidal thoughts - 0 -  PHQ-9 Score - 5 -     Review of Systems  Cardiovascular: Positive for leg swelling.  Gastrointestinal: Positive for constipation.  Genitourinary:       Bladder Control Problems    Neurological: Positive for dizziness, tremors, weakness and numbness.       Tingling  Psychiatric/Behavioral:       Depression  All other systems reviewed and are negative.      Objective:   Physical Exam  Constitutional: She is oriented to person, place, and time. She appears well-developed and well-nourished.  HENT:  Head: Normocephalic and atraumatic.  Neck: Normal range of motion. Neck supple.  Cardiovascular: Normal rate and regular rhythm.   Pulmonary/Chest: Effort normal and breath sounds normal.  Musculoskeletal:  Normal Muscle Bulk and Muscle Testing Reveals: Upper Extremities: Full ROM and Muscle Strength 5/5 Right Rhomboid Tenderness Thoracic Paraspinal Tenderness: T-7- T-9 Lower Extremities: Full ROM and Muscle Strength 5/5 Left Lower Extremity Flexion Produces Pain into Patella Arises from chair with ease Narrow Based Gait  Neurological: She is alert and oriented to person, place, and time.  Skin: Skin is warm and dry.  Psychiatric: She has a normal mood and affect.  Nursing note and vitals reviewed.         Assessment & Plan:  1. Cervical postlaminectomy syndrome: with neck and shoulder pain. Continue exercise routine and using heat therapy. Continue MS Contin 15 mg one tablet three times a day #90.  2. Fibromyalgia: Continue with activity and exercise regime. 3. Right Shoulder Pain: Continue HEP and alternate heat and ice therapy 4. Left Knee Pain: Continue with HEP and Heat Therapy  15 minutes of face to face patient care time was spent during this visit. All questions were encouraged and answered.   F/U in 1 month

## 2015-06-09 ENCOUNTER — Ambulatory Visit
Admission: RE | Admit: 2015-06-09 | Discharge: 2015-06-09 | Disposition: A | Discharge status: Home / Self Care | Payer: Medicare Other | Source: Ambulatory Visit

## 2015-06-09 DIAGNOSIS — Z1231 Encounter for screening mammogram for malignant neoplasm of breast: Secondary | ICD-10-CM

## 2015-06-22 ENCOUNTER — Other Ambulatory Visit: Payer: Self-pay | Admitting: Family Medicine

## 2015-06-24 DIAGNOSIS — J9610 Chronic respiratory failure, unspecified whether with hypoxia or hypercapnia: Secondary | ICD-10-CM | POA: Diagnosis not present

## 2015-07-07 ENCOUNTER — Encounter: Payer: Self-pay | Admitting: Registered Nurse

## 2015-07-07 ENCOUNTER — Encounter: Payer: Medicare Other | Attending: Physical Medicine & Rehabilitation | Admitting: Registered Nurse

## 2015-07-07 VITALS — BP 138/68 | HR 67 | Resp 14

## 2015-07-07 DIAGNOSIS — M545 Low back pain, unspecified: Secondary | ICD-10-CM

## 2015-07-07 DIAGNOSIS — Z5181 Encounter for therapeutic drug level monitoring: Secondary | ICD-10-CM

## 2015-07-07 DIAGNOSIS — Z79899 Other long term (current) drug therapy: Secondary | ICD-10-CM | POA: Diagnosis not present

## 2015-07-07 DIAGNOSIS — G894 Chronic pain syndrome: Secondary | ICD-10-CM

## 2015-07-07 DIAGNOSIS — M797 Fibromyalgia: Secondary | ICD-10-CM

## 2015-07-07 MED ORDER — MORPHINE SULFATE ER 15 MG PO TBCR: 15.0000 mg | EXTENDED_RELEASE_TABLET | Freq: Three times a day (TID) | ORAL | Status: DC

## 2015-07-07 NOTE — Progress Notes (Signed)
Subjective:    Patient ID: [name], female    DOB: Jan 01, 1951, 65 y.o.   MRN: [id number]  HPI: [name] is a 65 year old female who returns for follow up for chronic pain and medication refill. She says her pain is located in her right shoulder and mid-back. She rates her pain 5. Her current exercise regime is performing stretching exercises and walking.  Pain Inventory Average Pain 5 Pain Right Now 5 My pain is constant, burning and aching  In the last 24 hours, has pain interfered with the following? General activity 5 Relation with others 5 Enjoyment of life 5 What TIME of day is your pain at its worst? evening, night  Sleep (in general) Fair  Pain is worse with: walking, bending, sitting, inactivity, standing and some activites Pain improves with: rest, heat/ice, therapy/exercise, pacing activities, medication, TENS and injections Relief from Meds: 6  Mobility walk without assistance how many minutes can you walk? 10-15 ability to climb steps?  yes do you drive?  yes Do you have any goals in this area?  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  yes  Neuro/Psych bladder control problems weakness tremor tingling spasms depression  Prior Studies Any changes since last visit?  no bone scan x-rays CT/MRI nerve study  Physicians involved in your care Any changes since last visit?  no   Family History  Problem Relation Age of Onset  . Breast cancer Mother   . Diabetes Mother   . Heart disease Mother   . Diabetes Father   . Heart disease Father   . Stomach cancer Maternal Uncle   . Breast cancer Paternal Aunt   . Colon cancer Neg Hx   . Emphysema Father   . Allergies Mother   . Allergies Father    Social History   Social History  . Marital Status: Married      Spouse Name: N/A  . Number of Children: N/A  . Years of Education: N/A   Occupational History  . Bookkeeper    Social History Main Topics  . Smoking status: Never Smoker   . Smokeless tobacco: Never Used  . Alcohol Use: No  . Drug Use: No  . Sexual Activity: Not Asked   Other Topics Concern  . None   Social History Narrative   Past Surgical History  Procedure Laterality Date  . Appendectomy  1988  . Cholecystectomy  2005  . Tubal ligation  1979  . Tonsillectomy and adenoidectomy    . Anterior cervical decomp/discectomy fusion  2009, 1993    first procedure in ~ 1993/Dr Botero  . Nasal sinus surgery  2006  . Abdominal hysterectomy  1988   Past Medical History  Diagnosis Date  . GERD (gastroesophageal reflux disease)   . Unspecified essential hypertension   . Fibromyalgia   . B12 deficiency   . Depression   . Irritable bowel syndrome   . Hyperlipemia   . Migraines   . HYPOTHYROIDISM 05/29/2007  . Type II or unspecified type diabetes mellitus with neurological manifestations, not stated as uncontrolled 04/07/2013  . Generalized hyperhidrosis 04/17/2014   BP 138/68 mmHg  Pulse 67  Resp 14  SpO2 96%  Opioid Risk Score:   Fall Risk Score:  `1  Depression screen [facility] 2/9  Depression screen [facility] 2/9 02/04/2015 09/11/2014 10/15/2013  Decreased Interest 0 2 0  Down, Depressed, Hopeless 0 0 0  PHQ - 2 Score 0 2 0  Altered sleeping - 0 -  Tired, decreased energy - 2 -  Change in appetite - 0 -  Feeling bad or failure about yourself  - 0 -  Trouble concentrating - 1 -  Moving slowly or fidgety/restless - 0 -  Suicidal thoughts - 0 -  PHQ-9 Score - 5 -     Review of Systems  Cardiovascular: Positive for leg swelling.  Gastrointestinal: Positive for constipation.  All other systems reviewed and are negative.      Objective:   Physical Exam  Constitutional: She is oriented to person, place, and time. She appears well-developed and well-nourished.  HENT:   Head: Normocephalic and atraumatic.  Neck: Normal range of motion. Neck supple.  Cardiovascular: Normal rate and regular rhythm.   Pulmonary/Chest: Effort normal and breath sounds normal.  Musculoskeletal:  Normal Muscle Bulk and Muscle Testing Reveals: Upper Extremities: Full ROM and Muscle Strength 5/5 Thoracic Paraspinal Tenderness: T- 7- T-9 Lower Extremities: Full ROM and Muscle Strength 5/5 Arises from chair with ease Narrow Based gait  Neurological: She is alert and oriented to person, place, and time.  Skin: Skin is warm and dry.  Psychiatric: She has a normal mood and affect.  Nursing note and vitals reviewed.         Assessment & Plan:  1. Cervical postlaminectomy syndrome: with neck and shoulder pain. Continue exercise routine and using heat therapy. Continue MS Contin 15 mg one tablet three times a day #90.  2. Fibromyalgia: Continue with activity and exercise regime. 3. Right Shoulder Pain: Continue HEP and alternate heat and ice therapy  15 minutes of face to face patient care time was spent during this visit. All questions were encouraged and answered.   F/U in 1 month

## 2015-07-13 LAB — TOXASSURE SELECT,+ANTIDEPR,UR: PDF: 0

## 2015-07-16 NOTE — Progress Notes (Signed)
Urine drug screen for this encounter is consistent for prescribed medication 

## 2015-07-25 DIAGNOSIS — J9610 Chronic respiratory failure, unspecified whether with hypoxia or hypercapnia: Secondary | ICD-10-CM | POA: Diagnosis not present

## 2015-08-04 ENCOUNTER — Encounter: Payer: Medicare Other | Attending: Physical Medicine & Rehabilitation | Admitting: Registered Nurse

## 2015-08-04 ENCOUNTER — Encounter: Payer: Self-pay | Admitting: Registered Nurse

## 2015-08-04 VITALS — BP 142/59 | HR 68 | Resp 16

## 2015-08-04 DIAGNOSIS — M545 Low back pain, unspecified: Secondary | ICD-10-CM

## 2015-08-04 DIAGNOSIS — G894 Chronic pain syndrome: Secondary | ICD-10-CM

## 2015-08-04 DIAGNOSIS — Z5181 Encounter for therapeutic drug level monitoring: Secondary | ICD-10-CM | POA: Insufficient documentation

## 2015-08-04 DIAGNOSIS — M797 Fibromyalgia: Secondary | ICD-10-CM

## 2015-08-04 DIAGNOSIS — Z79899 Other long term (current) drug therapy: Secondary | ICD-10-CM | POA: Diagnosis not present

## 2015-08-04 DIAGNOSIS — IMO0001 Reserved for inherently not codable concepts without codable children: Secondary | ICD-10-CM

## 2015-08-04 DIAGNOSIS — M609 Myositis, unspecified: Secondary | ICD-10-CM

## 2015-08-04 DIAGNOSIS — M791 Myalgia: Secondary | ICD-10-CM | POA: Diagnosis not present

## 2015-08-04 MED ORDER — MORPHINE SULFATE ER 15 MG PO TBCR: 15.0000 mg | EXTENDED_RELEASE_TABLET | Freq: Three times a day (TID) | ORAL | Status: DC

## 2015-08-04 NOTE — Progress Notes (Signed)
Subjective:    Patient ID: [name], female    DOB: [birth year], 65 y.o.   MRN: [id number]  HPI: [name] is a 65 year old female who returns for follow up for chronic pain and medication refill. She says her pain is located in her neck and mid-back. She rates her pain 4. Her current exercise regime is performing stretching exercises and walking.  Pain Inventory Average Pain 5 Pain Right Now 4 My pain is constant, burning, stabbing and aching  In the last 24 hours, has pain interfered with the following? General activity 4 Relation with others 4 Enjoyment of life 4 What TIME of day is your pain at its worst? evening and night Sleep (in general) Fair  Pain is worse with: walking, bending, sitting, inactivity, standing, unsure and some activites Pain improves with: rest, heat/ice, therapy/exercise, pacing activities, medication, TENS and injections Relief from Meds: 6  Mobility walk without assistance how many minutes can you walk? 10-15 mins ability to climb steps?  yes do you drive?  yes Do you have any goals in this area?  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  yes  Neuro/Psych bladder control problems weakness numbness tremor tingling spasms depression  Prior Studies Any changes since last visit?  no  Physicians involved in your care Any changes since last visit?  no   Family History  Problem Relation Age of Onset  . Breast cancer Mother   . Diabetes Mother   . Heart disease Mother   . Diabetes Father   . Heart disease Father   . Stomach cancer Maternal Uncle   . Breast cancer Paternal Aunt   . Colon cancer Neg Hx   . Emphysema Father   . Allergies Mother   . Allergies Father    Social History   Social History  . Marital Status: Married    Spouse Name:  N/A  . Number of Children: N/A  . Years of Education: N/A   Occupational History  . Bookkeeper    Social History Main Topics  . Smoking status: Never Smoker   . Smokeless tobacco: Never Used  . Alcohol Use: No  . Drug Use: No  . Sexual Activity: Not Asked   Other Topics Concern  . None   Social History Narrative   Past Surgical History  Procedure Laterality Date  . Appendectomy  1988  . Cholecystectomy  2005  . Tubal ligation  1979  . Tonsillectomy and adenoidectomy    . Anterior cervical decomp/discectomy fusion  2009, 1993    first procedure in ~ 1993/Dr Botero  . Nasal sinus surgery  2006  . Abdominal hysterectomy  1988   Past Medical History  Diagnosis Date  . GERD (gastroesophageal reflux disease)   . Unspecified essential hypertension   . Fibromyalgia   . B12 deficiency   . Depression   . Irritable bowel syndrome   . Hyperlipemia   . Migraines   . HYPOTHYROIDISM 05/29/2007  . Type II or unspecified type diabetes mellitus with neurological manifestations, not stated as uncontrolled 04/07/2013  . Generalized hyperhidrosis 04/17/2014   BP 141/46 mmHg  Pulse 68  Resp 16  SpO2 97%  Opioid Risk Score:   Fall Risk Score:  `1  Depression screen PHQ 2/9  Depression screen [facility] 2/9  02/04/2015 09/11/2014 10/15/2013  Decreased Interest 0 2 0  Down, Depressed, Hopeless 0 0 0  PHQ - 2 Score 0 2 0  Altered sleeping - 0 -  Tired, decreased energy - 2 -  Change in appetite - 0 -  Feeling bad or failure about yourself  - 0 -  Trouble concentrating - 1 -  Moving slowly or fidgety/restless - 0 -  Suicidal thoughts - 0 -  PHQ-9 Score - 5 -     Review of Systems     Objective:   Physical Exam  Constitutional: She is oriented to person, place, and time. She appears well-developed and well-nourished.  HENT:  Head: Normocephalic and atraumatic.  Neck: Normal range of motion. Neck supple.  Cardiovascular: Normal rate and regular rhythm.   Pulmonary/Chest: Effort  normal and breath sounds normal.  Musculoskeletal:  Normal Muscle Bulk and Muscle Testing Reveals: Upper Extremities: Full ROM and Muscle Strength 5/5 Thoracic Paraspinal Tenderness: T-6- T-7 Lower Extremities: Full ROM and Muscle Strength 5/5 Arises from chair with ease Narrow Based Gait  Neurological: She is alert and oriented to person, place, and time.  Skin: Skin is warm and dry.  Psychiatric: She has a normal mood and affect.  Nursing note and vitals reviewed.         Assessment & Plan:  1. Cervical postlaminectomy syndrome: with neck and shoulder pain. Continue exercise routine and using heat therapy. Continue MS Contin 15 mg one tablet three times a day #90.  2. Fibromyalgia: Continue with activity and exercise regime. 3. Right Shoulder Pain: No complaints Today:Continue HEP and alternate heat and ice therapy  15 minutes of face to face patient care time was spent during this visit. All questions were encouraged and answered.   F/U in 1 month

## 2015-08-10 ENCOUNTER — Other Ambulatory Visit: Payer: Self-pay | Admitting: Family Medicine

## 2015-08-10 NOTE — Telephone Encounter (Addendum)
Last office visit 03/12/2015.  Not on current medication list.  Ok to refill?

## 2015-08-14 ENCOUNTER — Ambulatory Visit (INDEPENDENT_AMBULATORY_CARE_PROVIDER_SITE_OTHER): Payer: Medicare Other | Admitting: Pulmonary Disease

## 2015-08-14 ENCOUNTER — Encounter: Payer: Self-pay | Admitting: Pulmonary Disease

## 2015-08-14 VITALS — BP 128/60 | HR 90 | Ht 66.0 in | Wt 224.8 lb

## 2015-08-14 DIAGNOSIS — G4734 Idiopathic sleep related nonobstructive alveolar hypoventilation: Secondary | ICD-10-CM

## 2015-08-14 DIAGNOSIS — E662 Morbid (severe) obesity with alveolar hypoventilation: Secondary | ICD-10-CM | POA: Diagnosis not present

## 2015-08-14 NOTE — Patient Instructions (Signed)
Keep using your 2L of oxygen when you sleep. Follow up in 1 year.

## 2015-08-14 NOTE — Progress Notes (Signed)
Chief Complaint  Patient presents with  . Follow-up    pt. states breathing is baseline. occ. dry cough. feels 02 @ night has improved sleep and feels she has more energy during the day.    History of Present Illness: [name] is a 65 y.o. female for evaluation of sleep problems.  Resting better. Has more energy during the day. Snoring has improved greatly. No more observed apnea from her spouse. Sleep is less restless.  She takes melatonin 20 minutes before bedtime.  She has used this for years.  This helps.  She also takes Remeron for fibromyalgia, and this helps her sleep.  She takes MS contin 3 times per day.  She takes neurontin for restless leg syndrome.  This controls her symptoms.   She goes to sleep at 12 am.  She falls asleep after 5 minutes.  She wakes up 1 time to use the bathroom.  She gets out of bed at 9 am.  She does not feel tired in the morning.  Headaches are less frequent now.  She drinks 1-2 cups of coffee in the morning.  Tests: Echo 12/28/07 >> EF 60 to 65%, mild AR CT maxillofacial 12/09/14 >> Lt TMJ DJD, changes from prior sinus surgery PSG 02/26/15 >> AHI 4.4, SaO2 low 83%. Spent 23.30mn with SpO2 <88%. ONO with RA 03/11/15 >> test time 8 hrs 50 min. Baseline SpO2 92%, low SpO2 78%. Spent 24 min with SpO2 < 88%.  [name] has a past medical history of GERD (gastroesophageal reflux disease); Unspecified essential hypertension; Fibromyalgia; B12 deficiency; Depression; Irritable bowel syndrome; Hyperlipemia; Migraines; HYPOTHYROIDISM (05/29/2007); Type II or unspecified type diabetes mellitus with neurological manifestations, not stated as uncontrolled (04/07/2013); and Generalized hyperhidrosis (04/17/2014).  [name] has past surgical history that includes Appendectomy (1988); Cholecystectomy (2005); Tubal ligation (1979); Tonsillectomy and adenoidectomy; Anterior cervical decomp/discectomy fusion (2009, 1993); Nasal sinus surgery (2006); and  Abdominal hysterectomy (1988).  Prior to Admission medications   Medication Sig Start Date End Date Taking? Authorizing Provider  Ascorbic Acid (VITAMIN C) 1000 MG tablet Take 1,000 mg by mouth daily.    Yes Historical Provider, MD  aspirin 81 MG tablet Take 81 mg by mouth daily.     Yes Historical Provider, MD  B Complex Vitamins (B COMPLEX 100 PO) Take 1 tablet by mouth daily.   Yes Historical Provider, MD  BIOTIN PO Take 1 tablet by mouth daily.   Yes Historical Provider, MD  Calcium Carbonate (CALCIUM 600 PO) Take 600 mg by mouth 2 (two) times daily.    Yes Historical Provider, MD  cetirizine (ZYRTEC) 10 MG tablet Take 10 mg by mouth daily.     Yes Historical Provider, MD  Cholecalciferol (VITAMIN D) 2000 UNITS tablet Take 2,000 Units by mouth daily.     Yes Historical Provider, MD  citalopram (CELEXA) 40 MG tablet Take 1 tablet (40 mg total) by mouth daily. MUST KEEP UPCOMING APPOINTMENT FOR MORE REFILLS 09/13/14  Yes Spencer Copland, MD  clindamycin (CLEOCIN) 300 MG capsule Take 600 mg by mouth. before dental appointments 03/10/14  Yes Historical Provider, MD  Coenzyme Q-10 100 MG capsule Take 100 mg by mouth daily.   Yes Historical Provider, MD  diclofenac sodium (VOLTAREN) 1 % GEL Apply 4 g topically 4 (four) times daily. 08/06/14  Yes Spencer Copland, MD  docusate sodium (COLACE) 100 MG capsule Take 100 mg by mouth at bedtime.   Yes Historical Provider, MD  gabapentin (NEURONTIN)  600 MG tablet TAKE 1 TABLET BY MOUTH THREE TIMES A DAY 08/06/14  Yes Charlett Blake, MD  glucose monitoring kit (FREESTYLE) monitoring kit 1 each by Does not apply route as needed for other. DISPENSE CHEAPEST KIT, PER PATIENT INSURANCE, BID TESTING, DX 250.00 05/06/13  Yes Spencer Copland, MD  levothyroxine (SYNTHROID, LEVOTHROID) 50 MCG tablet TAKE 1 TABLET BY MOUTH DAILY 04/10/14  Yes Spencer Copland, MD  loratadine (CLARITIN) 10 MG tablet Take 10 mg by mouth daily.   Yes Historical Provider, MD   Melaton-Thean-Cham-PassF-LBalm (MELATONIN + L-THEANINE PO) Take by mouth at bedtime. 87m/25mg tablet    Yes Historical Provider, MD  metFORMIN (GLUCOPHAGE) 500 MG tablet TAKE 1 TABLET BY MOUTH TWICE A DAY WITH A MEAL 08/25/14  Yes SOwens Loffler MD  methocarbamol (ROBAXIN) 500 MG tablet TAKE ONE TABLET BY MOUTH EVERY EIGHT HOURS AS NEEDED FOR MUSCLE SPASMS 10/27/14  Yes ACharlett Blake MD  mirtazapine (REMERON) 15 MG tablet TAKE ONE TABLET BY MOUTH EVERY NIGHT AT BEDTIME 06/09/14  Yes Spencer Copland, MD  morphine (MS CONTIN) 15 MG 12 hr tablet Take 1 tablet (15 mg total) by mouth 3 (three) times daily. 12/05/14  Yes EBayard Hugger NP  oxybutynin (DITROPAN-XL) 10 MG 24 hr tablet Take 1 tablet (10 mg total) by mouth at bedtime. 10/20/14  Yes Spencer Copland, MD  pantoprazole (PROTONIX) 40 MG tablet Take 1 tablet (40 mg total) by mouth 2 (two) times daily. 10/29/14  Yes Spencer Copland, MD  senna-docusate (PERI-COLACE) 8.6-50 MG per tablet Take 2 tablets by mouth at bedtime as needed for mild constipation.    Yes Historical Provider, MD    Allergies  Allergen Reactions  . Rizatriptan Benzoate Anaphylaxis    Throat and Tongue Swelling Closed  . Sumatriptan Other (See Comments)    Throat closed & Tongue Swelling  . Tizanidine Shortness Of Breath  . Clarithromycin   . Cyclobenzaprine Hcl     REACTION: THROAT CLOSED,TOUNGE SWELLING  . Doxycycline   . Duloxetine   . Metronidazole   . Naproxen Sodium Hives  . Oxycodone Hcl Diarrhea    Vomiting  . Penicillins     TOLERATES CEPHALOSPORINS  . Rosuvastatin     REACTION: MUSCLE ACHES  . Tramadol   . Venlafaxine Hives    Her family history includes Allergies in her father and mother; Breast cancer in her mother and paternal aunt; Diabetes in her father and mother; Emphysema in her father; Heart disease in her father and mother; Stomach cancer in her maternal uncle. There is no history of Colon cancer.  She  reports that she has never smoked.  She has never used smokeless tobacco. She reports that she does not drink alcohol or use illicit drugs.  Review of Systems  Constitutional: Negative for fever and unexpected weight change.  Respiratory: Positive for cough. Negative for shortness of breath, chest tightness and wheezing.   Cardiovascular: Negative for chest pain or leg swelling.  Gastrointestinal: Negative for nausea and vomiting.       Acid heartburn // idigestion  Genitourinary: Negative for dysuria.  Psychiatric/Behavioral: Positive for dysphoric mood. The patient is not nervous/anxious.    Physical Exam: Blood pressure 106/54, pulse 69, height 5' 6"  (1.676 m), weight 222 lb 12.8 oz (101.061 kg), SpO2 97 %. Body mass index is 36.3 kg/(m^2).  General - No distress ENT - No sinus tenderness, no oral exudate, no LAN, no thyromegaly, TM clear, pupils equal/reactive, click with jaw opening, MP 3, scalloped  tongue Cardiac - s1s2 regular, 2/6 systolic click murmur, pulses symmetric Chest - No wheeze/rales/dullness, good air entry, normal respiratory excursion Back - No focal tenderness Abd - Soft, non-tender, no organomegaly, + bowel sounds Ext - No edema Neuro - Normal strength, cranial nerves intact Skin - No rashes Psych - Normal mood, and behavior  Discussion: Found to have nocturnal hypoxia. She was started on 2L supplemental oxygen at night. She reports improvement of her symptoms.  Assessment/plan: Nocturnal Hypoxia. Plan: - Continue 2L oxygen at night while asleep.  Obesity hypoventilation syndrome. Plan: - Recommend working on weight loss.  Jacques Earthly, MD  Internal Medicine PGY-2  Reviewed above.  Her sleep is doing better.  She is more alert during the day, and not snoring as much.  No wheeze.  HR regular.  No edema.  Assessment/plan: Sleep related hypoxia >> most likely related to obesity. - continue 2 liters oxygen at night  Obesity. - discussed options to assist with weight  loss.  Chesley Mires, MD [facility] Pulmonary/Critical Care 08/14/2015, 3:03 PM Pager:  [phone]

## 2015-08-24 ENCOUNTER — Other Ambulatory Visit: Payer: Self-pay | Admitting: Family Medicine

## 2015-08-25 DIAGNOSIS — J9610 Chronic respiratory failure, unspecified whether with hypoxia or hypercapnia: Secondary | ICD-10-CM | POA: Diagnosis not present

## 2015-09-01 ENCOUNTER — Encounter: Payer: Self-pay | Admitting: Registered Nurse

## 2015-09-01 ENCOUNTER — Encounter: Payer: Medicare Other | Attending: Physical Medicine & Rehabilitation | Admitting: Registered Nurse

## 2015-09-01 VITALS — BP 120/56 | HR 62 | Resp 14

## 2015-09-01 DIAGNOSIS — M609 Myositis, unspecified: Secondary | ICD-10-CM

## 2015-09-01 DIAGNOSIS — G894 Chronic pain syndrome: Secondary | ICD-10-CM

## 2015-09-01 DIAGNOSIS — M791 Myalgia: Secondary | ICD-10-CM

## 2015-09-01 DIAGNOSIS — IMO0001 Reserved for inherently not codable concepts without codable children: Secondary | ICD-10-CM

## 2015-09-01 DIAGNOSIS — Z79899 Other long term (current) drug therapy: Secondary | ICD-10-CM | POA: Diagnosis not present

## 2015-09-01 DIAGNOSIS — Z5181 Encounter for therapeutic drug level monitoring: Secondary | ICD-10-CM | POA: Insufficient documentation

## 2015-09-01 DIAGNOSIS — M545 Low back pain, unspecified: Secondary | ICD-10-CM

## 2015-09-01 DIAGNOSIS — M25562 Pain in left knee: Secondary | ICD-10-CM

## 2015-09-01 DIAGNOSIS — M797 Fibromyalgia: Secondary | ICD-10-CM

## 2015-09-01 MED ORDER — MORPHINE SULFATE ER 15 MG PO TBCR: 15.0000 mg | EXTENDED_RELEASE_TABLET | Freq: Three times a day (TID) | ORAL | Status: DC

## 2015-09-01 NOTE — Progress Notes (Signed)
Subjective:    Patient ID: [name], female    DOB: Mar 28, 1951, 65 y.o.   MRN: [id number]  HPI: [name] is a 65 year old female who returns for follow up for chronic pain and medication refill. She states her pain is located in her neck, mid-back and left knee. She rates her pain 5. Her current exercise regime is performing stretching exercises and walking.  Pain Inventory Average Pain 5 Pain Right Now 5 My pain is constant, burning and aching  In the last 24 hours, has pain interfered with the following? General activity 6 Relation with others 6 Enjoyment of life 6 What TIME of day is your pain at its worst? evening, night Sleep (in general) Fair  Pain is worse with: walking, bending, sitting, inactivity, standing and some activites Pain improves with: rest, heat/ice, therapy/exercise, pacing activities, medication, TENS and injections Relief from Meds: 6  Mobility walk without assistance how many minutes can you walk? 10-15 ability to climb steps?  yes do you drive?  yes Do you have any goals in this area?  yes  Function employed # of hrs/week 10 disabled: date disabled 12/2008 I need assistance with the following:  meal prep, household duties and shopping Do you have any goals in this area?  yes  Neuro/Psych bladder control problems weakness numbness tremor tingling spasms  Prior Studies Any changes since last visit?  no  Physicians involved in your care Any changes since last visit?  no   Family History  Problem Relation Age of Onset  . Breast cancer Mother   . Diabetes Mother   . Heart disease Mother   . Diabetes Father   . Heart disease Father   . Stomach cancer Maternal Uncle   . Breast cancer Paternal Aunt   . Colon cancer Neg Hx   . Emphysema Father   . Allergies Mother   . Allergies Father    Social History   Social History  . Marital Status: Married    Spouse Name: N/A  . Number of Children: N/A  . Years of Education:  N/A   Occupational History  . Bookkeeper    Social History Main Topics  . Smoking status: Never Smoker   . Smokeless tobacco: Never Used  . Alcohol Use: No  . Drug Use: No  . Sexual Activity: Not Asked   Other Topics Concern  . None   Social History Narrative   Past Surgical History  Procedure Laterality Date  . Appendectomy  1988  . Cholecystectomy  2005  . Tubal ligation  1979  . Tonsillectomy and adenoidectomy    . Anterior cervical decomp/discectomy fusion  2009, 1993    first procedure in ~ 1993/Dr Botero  . Nasal sinus surgery  2006  . Abdominal hysterectomy  1988   Past Medical History  Diagnosis Date  . GERD (gastroesophageal reflux disease)   . Unspecified essential hypertension   . Fibromyalgia   . B12 deficiency   . Depression   . Irritable bowel syndrome   . Hyperlipemia   . Migraines   . HYPOTHYROIDISM 05/29/2007  . Type II or unspecified type diabetes mellitus with neurological manifestations, not stated as uncontrolled 04/07/2013  . Generalized hyperhidrosis 04/17/2014   BP 120/56 mmHg  Pulse 62  Resp 14  SpO2 98%  Opioid Risk Score:   Fall Risk Score:  `1  Depression screen PHQ 2/9  Depression screen [facility] 2/9 02/04/2015 09/11/2014 10/15/2013  Decreased Interest 0 2  0  Down, Depressed, Hopeless 0 0 0  PHQ - 2 Score 0 2 0  Altered sleeping - 0 -  Tired, decreased energy - 2 -  Change in appetite - 0 -  Feeling bad or failure about yourself  - 0 -  Trouble concentrating - 1 -  Moving slowly or fidgety/restless - 0 -  Suicidal thoughts - 0 -  PHQ-9 Score - 5 -     Review of Systems  Cardiovascular: Positive for leg swelling.  Gastrointestinal: Positive for constipation.       Objective:   Physical Exam  Constitutional: She is oriented to person, place, and time. She appears well-developed and well-nourished.  HENT:  Head: Normocephalic and atraumatic.  Neck: Normal range of motion. Neck supple.  Cardiovascular: Normal rate and  regular rhythm.   Pulmonary/Chest: Effort normal and breath sounds normal.  Musculoskeletal:  Normal Muscle Bulk and Muscle Testing Reveals: Upper Extremities: Full ROM and Muscle Strength 5/5 Back without spinal or paraspinal tenderness Lower Extremities: Full ROM and Muscle Strength 5/5 Left Lower Extremity Flexion Produces Pain into Patella Arises from chair with ease Narrow Based Gait  Neurological: She is alert and oriented to person, place, and time.  Skin: Skin is warm and dry.  Psychiatric: She has a normal mood and affect.  Nursing note and vitals reviewed.         Assessment & Plan:  1. Cervical postlaminectomy syndrome: with neck and shoulder pain. Continue exercise routine and using heat therapy. Continue MS Contin 15 mg one tablet three times a day #90.  2. Fibromyalgia: Continue with activity and exercise regime. 3. Right Shoulder Pain: No complaints Today:Continue HEP and alternate heat and ice therapy 4. Left Knee Pain/ Mild Degenerative Changes: Continue Voltaren Gel.  15 minutes of face to face patient care time was spent during this visit. All questions were encouraged and answered.   F/U in 1 month

## 2015-09-14 ENCOUNTER — Encounter: Payer: Self-pay | Admitting: Gastroenterology

## 2015-09-21 ENCOUNTER — Other Ambulatory Visit: Payer: Self-pay | Admitting: Family Medicine

## 2015-09-21 ENCOUNTER — Encounter: Payer: Self-pay | Admitting: Family Medicine

## 2015-09-21 DIAGNOSIS — K219 Gastro-esophageal reflux disease without esophagitis: Secondary | ICD-10-CM

## 2015-09-21 DIAGNOSIS — K449 Diaphragmatic hernia without obstruction or gangrene: Secondary | ICD-10-CM

## 2015-09-22 ENCOUNTER — Encounter: Payer: Self-pay | Admitting: Gastroenterology

## 2015-09-22 DIAGNOSIS — J9610 Chronic respiratory failure, unspecified whether with hypoxia or hypercapnia: Secondary | ICD-10-CM | POA: Diagnosis not present

## 2015-09-29 ENCOUNTER — Other Ambulatory Visit: Payer: Self-pay | Admitting: *Deleted

## 2015-09-29 NOTE — Telephone Encounter (Signed)
Last office visit 03/12/2015.  Last refilled 08/10/2015 for 22 g with no refills.  Ok to refill?

## 2015-09-30 MED ORDER — MUPIROCIN 2 % EX OINT: TOPICAL_OINTMENT | CUTANEOUS | Status: DC

## 2015-10-05 ENCOUNTER — Encounter: Payer: Medicare Other | Attending: Physical Medicine & Rehabilitation | Admitting: Registered Nurse

## 2015-10-05 ENCOUNTER — Encounter: Payer: Self-pay | Admitting: Registered Nurse

## 2015-10-05 VITALS — BP 141/57 | HR 65

## 2015-10-05 DIAGNOSIS — M609 Myositis, unspecified: Secondary | ICD-10-CM

## 2015-10-05 DIAGNOSIS — M545 Low back pain, unspecified: Secondary | ICD-10-CM

## 2015-10-05 DIAGNOSIS — G894 Chronic pain syndrome: Secondary | ICD-10-CM | POA: Insufficient documentation

## 2015-10-05 DIAGNOSIS — M797 Fibromyalgia: Secondary | ICD-10-CM

## 2015-10-05 DIAGNOSIS — IMO0001 Reserved for inherently not codable concepts without codable children: Secondary | ICD-10-CM

## 2015-10-05 DIAGNOSIS — Z5181 Encounter for therapeutic drug level monitoring: Secondary | ICD-10-CM | POA: Diagnosis not present

## 2015-10-05 DIAGNOSIS — M791 Myalgia: Secondary | ICD-10-CM

## 2015-10-05 DIAGNOSIS — Z79899 Other long term (current) drug therapy: Secondary | ICD-10-CM | POA: Diagnosis not present

## 2015-10-05 MED ORDER — MORPHINE SULFATE ER 15 MG PO TBCR: 15.0000 mg | EXTENDED_RELEASE_TABLET | Freq: Three times a day (TID) | ORAL | Status: DC

## 2015-10-05 NOTE — Progress Notes (Signed)
Subjective:    Patient ID: [name], female    DOB: [birth year], 65 y.o.   MRN: [id number]  HPI: [name] is a 65 year old female who returns for follow up for chronic pain and medication refill. She states her pain is located in her mid-back. She rates her pain 5. Her current exercise regime is performing stretching exercises and walking. [name] forgot her medication, according to [facility] she picked up her MS Contin on 09/09/15. Reviewed the narcotic policy she verbalizes understanding.   Pain Inventory Average Pain 6 Pain Right Now 5 My pain is constant, burning and aching  In the last 24 hours, has pain interfered with the following? General activity 7 Relation with others 7 Enjoyment of life 6 What TIME of day is your pain at its worst? daytime, evening Sleep (in general) Fair  Pain is worse with: walking, bending, sitting, inactivity, standing and some activites Pain improves with: rest, heat/ice, therapy/exercise, pacing activities, medication, TENS and injections Relief from Meds: 5  Mobility walk without assistance how many minutes can you walk? 10-15 ability to climb steps?  yes do you drive?  yes Do you have any goals in this area?  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 I need assistance with the following:  meal prep, household duties and shopping Do you have any goals in this area?  yes  Neuro/Psych bladder control problems weakness numbness tremor tingling spasms depression  Prior Studies Any changes since last visit?  no  Physicians involved in your care Any changes since last visit?  no   Family History  Problem Relation Age of Onset  . Breast cancer Mother   . Diabetes Mother   . Heart disease Mother   . Diabetes Father   . Heart disease Father   . Stomach cancer Maternal Uncle   . Breast cancer Paternal Aunt   . Colon cancer Neg Hx   . Emphysema Father   .  Allergies Mother   . Allergies Father    Social History   Social History  . Marital Status: Married    Spouse Name: N/A  . Number of Children: N/A  . Years of Education: N/A   Occupational History  . Bookkeeper    Social History Main Topics  . Smoking status: Never Smoker   . Smokeless tobacco: Never Used  . Alcohol Use: No  . Drug Use: No  . Sexual Activity: Not Asked   Other Topics Concern  . None   Social History Narrative   Past Surgical History  Procedure Laterality Date  . Appendectomy  1988  . Cholecystectomy  2005  . Tubal ligation  1979  . Tonsillectomy and adenoidectomy    . Anterior cervical decomp/discectomy fusion  2009, 1993    first procedure in ~ 1993/Dr Botero  . Nasal sinus surgery  2006  . Abdominal hysterectomy  1988   Past Medical History  Diagnosis Date  . GERD (gastroesophageal reflux disease)   . Unspecified essential hypertension   . Fibromyalgia   . B12 deficiency   . Depression   . Irritable bowel syndrome   . Hyperlipemia   . Migraines   . HYPOTHYROIDISM 05/29/2007  . Type II or unspecified type diabetes mellitus with neurological manifestations, not stated as uncontrolled 04/07/2013  . Generalized hyperhidrosis 04/17/2014   BP 141/57 mmHg  Pulse 65  SpO2 97%  Opioid Risk Score:  Fall Risk Score:  `1  Depression screen PHQ 2/9  Depression screen [facility] 2/9 10/05/2015 02/04/2015 09/11/2014 10/15/2013  Decreased Interest 2 0 2 0  Down, Depressed, Hopeless 0 0 0 0  PHQ - 2 Score 2 0 2 0  Altered sleeping 3 - 0 -  Tired, decreased energy 1 - 2 -  Change in appetite 0 - 0 -  Feeling bad or failure about yourself  0 - 0 -  Trouble concentrating 1 - 1 -  Moving slowly or fidgety/restless 0 - 0 -  Suicidal thoughts 0 - 0 -  PHQ-9 Score 7 - 5 -  Difficult doing work/chores Somewhat difficult - - -     Review of Systems  Cardiovascular: Positive for leg swelling.  Gastrointestinal: Positive for constipation.  All other systems  reviewed and are negative.      Objective:   Physical Exam  Constitutional: She is oriented to person, place, and time. She appears well-developed and well-nourished.  HENT:  Head: Normocephalic and atraumatic.  Neck: Normal range of motion. Neck supple.  Cardiovascular: Normal rate and regular rhythm.   Pulmonary/Chest: Effort normal and breath sounds normal.  Musculoskeletal:  Normal Muscle Bulk and Muscle Testing Reveals: Upper Extremities: Full ROM and Muscle Strength 5/5 Thoracic Paraspinal Tenderness: T-3- T-5  T-7- T-9 T-11- T-12 Lower Extremities: Full ROM and Muscle Strength 5/5 Arises from chair with ease Narrow based Gait  Neurological: She is alert and oriented to person, place, and time.  Skin: Skin is warm and dry.  Psychiatric: She has a normal mood and affect.  Nursing note and vitals reviewed.         Assessment & Plan:  1. Cervical postlaminectomy syndrome: with neck and shoulder pain. Continue exercise routine and using heat therapy. Continue MS Contin 15 mg one tablet three times a day #90.  We will continue the opioid monitoring program, this consists of regular clinic visits, examinations, urine drug screen, pill counts as well as use of [place] Controlled Substance reporting System. 2. Fibromyalgia: Continue with Gabapentin, activity and exercise regime. 3. Right Shoulder Pain: No complaints Today:Continue HEP and alternate heat and ice therapy 4. Left Knee Pain/ Mild Degenerative Changes: No complaints today.Continue Voltaren Gel.  15 minutes of face to face patient care time was spent during this visit. All questions were encouraged and answered.   F/U in 1 month

## 2015-10-21 ENCOUNTER — Other Ambulatory Visit: Payer: Self-pay | Admitting: Family Medicine

## 2015-10-21 DIAGNOSIS — M542 Cervicalgia: Secondary | ICD-10-CM | POA: Diagnosis not present

## 2015-10-23 ENCOUNTER — Ambulatory Visit (HOSPITAL_BASED_OUTPATIENT_CLINIC_OR_DEPARTMENT_OTHER): Payer: Medicare Other | Admitting: Physical Medicine & Rehabilitation

## 2015-10-23 ENCOUNTER — Encounter: Payer: Self-pay | Admitting: Physical Medicine & Rehabilitation

## 2015-10-23 VITALS — BP 152/65 | HR 67

## 2015-10-23 DIAGNOSIS — Z79899 Other long term (current) drug therapy: Secondary | ICD-10-CM | POA: Diagnosis not present

## 2015-10-23 DIAGNOSIS — M5412 Radiculopathy, cervical region: Secondary | ICD-10-CM

## 2015-10-23 DIAGNOSIS — G894 Chronic pain syndrome: Secondary | ICD-10-CM

## 2015-10-23 DIAGNOSIS — Z5181 Encounter for therapeutic drug level monitoring: Secondary | ICD-10-CM

## 2015-10-23 DIAGNOSIS — S161XXA Strain of muscle, fascia and tendon at neck level, initial encounter: Secondary | ICD-10-CM | POA: Diagnosis not present

## 2015-10-23 DIAGNOSIS — M961 Postlaminectomy syndrome, not elsewhere classified: Secondary | ICD-10-CM

## 2015-10-23 DIAGNOSIS — J9610 Chronic respiratory failure, unspecified whether with hypoxia or hypercapnia: Secondary | ICD-10-CM | POA: Diagnosis not present

## 2015-10-23 NOTE — Progress Notes (Signed)
Subjective:    Patient ID: [name], female    DOB: 11-04-1950, 65 y.o.   MRN: [id number]  HPI 65 year old female with history of ACDF at C5-C6 by Dr. Joya Salm in 2009 who presents today with worsening of neck pain. This occurred approximately one week ago when she woke up with increased neck pain. It was bad for a couple days then seem to let up for one day but then worsened again. She went to an orthopedic urgent care on 10/21/2015 where she was evaluated by the physician assistant. She had x-rays of the cervical spine that I was able to review today. She does have some tingling of the last 3 digits of the left hand. No weakness. Pain Inventory Average Pain 8 Pain Right Now 7 My pain is constant, sharp, burning, stabbing, tingling and aching  In the last 24 hours, has pain interfered with the following? General activity 8 Relation with others 8 Enjoyment of life 8 What TIME of day is your pain at its worst? night Sleep (in general) Poor  Pain is worse with: walking, bending, sitting, inactivity, standing and some activites Pain improves with: rest, heat/ice, therapy/exercise, pacing activities and medication Relief from Meds: 3  Mobility how many minutes can you walk? 10-15 ability to climb steps?  yes do you drive?  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  yes CT/MRI Reviewed neck x-rays from [facility] orthopedic specialists performed on 10/21/2015 This showed good positioning of her C5-C6 hardware, anterior there is good alignment of the cervical spine. There are some degenerative changes above the level of the fusion i.e. C3-4 and C4-5. No oblique views were taken. Physicians involved in your care Any changes  since last visit?  no   Family History  Problem Relation Age of Onset  . Breast cancer Mother   . Diabetes Mother   . Heart disease Mother   . Diabetes Father   . Heart disease Father   . Stomach cancer Maternal Uncle   . Breast cancer Paternal Aunt   . Colon cancer Neg Hx   . Emphysema Father   . Allergies Mother   . Allergies Father    Social History   Social History  . Marital Status: Married    Spouse Name: N/A  . Number of Children: N/A  . Years of Education: N/A   Occupational History  . Bookkeeper    Social History Main Topics  . Smoking status: Never Smoker   . Smokeless tobacco: Never Used  . Alcohol Use: No  . Drug Use: No  . Sexual Activity: Not Asked   Other Topics Concern  . None   Social History Narrative   Past Surgical History  Procedure Laterality Date  . Appendectomy  1988  . Cholecystectomy  2005  . Tubal ligation  1979  . Tonsillectomy and adenoidectomy    . Anterior cervical decomp/discectomy fusion  2009, 1993    first procedure in ~ 1993/Dr Botero  . Nasal sinus surgery  2006  . Abdominal hysterectomy  1988   Past Medical History  Diagnosis Date  . GERD (gastroesophageal reflux disease)   . Unspecified essential hypertension   . Fibromyalgia   . B12 deficiency   . Depression   . Irritable bowel syndrome   .  Hyperlipemia   . Migraines   . HYPOTHYROIDISM 05/29/2007  . Type II or unspecified type diabetes mellitus with neurological manifestations, not stated as uncontrolled 04/07/2013  . Generalized hyperhidrosis 04/17/2014   BP 152/65 mmHg  Pulse 67  SpO2 98%  Opioid Risk Score:   Fall Risk Score:  `1  Depression screen PHQ 2/9  Depression screen [facility] 2/9 10/23/2015 10/05/2015 02/04/2015 09/11/2014 10/15/2013  Decreased Interest 2 2 0 2 0  Down, Depressed, Hopeless 0 0 0 0 0  PHQ - 2 Score 2 2 0 2 0  Altered sleeping - 3 - 0 -  Tired, decreased energy - 1 - 2 -  Change in appetite - 0 - 0 -  Feeling bad or failure about  yourself  - 0 - 0 -  Trouble concentrating - 1 - 1 -  Moving slowly or fidgety/restless - 0 - 0 -  Suicidal thoughts - 0 - 0 -  PHQ-9 Score - 7 - 5 -  Difficult doing work/chores - Somewhat difficult - - -     Review of Systems  All other systems reviewed and are negative.      Objective:   Physical Exam  Constitutional: She is oriented to person, place, and time. She appears well-developed and well-nourished.  HENT:  Head: Normocephalic and atraumatic.  Eyes: Conjunctivae and EOM are normal. Pupils are equal, round, and reactive to light.  Neurological: She is alert and oriented to person, place, and time.  Psychiatric: She has a normal mood and affect.  Nursing note and vitals reviewed.  Patient with tenderness to palpation in the  Left suboccipital area as well as left superior lateral aspect of the scapula as well as left trapezius. Her neck range of motion is 75% flexion and extension lateral bending and rotation. She has negative Spurling's maneuver Motor strength is 5/5 bilateral deltoid biceps, triceps, grip Deep tendon reflexes 2+ bilateral biceps triceps brachioradialis. Sensation is intact to light touch as well as pinprick in bilateral upper and lower limbs There is no left evidence of atrophy in the hands.       Assessment & Plan:  1. 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 Acute on chronic neck pain, may have some mild radiculitis, no evidence of radiculopathy on examination. She has both myofascial component as well as likely some cervical spondylosis with facet pain above the level of the fusion most likely that C4-C5 X-rays reviewed no worrisome findings Agree with Voltaren pills. Patient will finish out her course. Continue cervical soft collar for one week If not much better in one week with schedule for C4-C5 left-sided medial branch blocks  Patient has myofascial component mainly in  the levator scapula and trapezius muscles. We'll do trigger point injection Trigger Point Injection  Indication: Left trapezius left levator, left longissimus Myofascial pain not relieved by medication management and other conservative care.  Informed consent was obtained after describing risk and benefits of the procedure with the patient, this includes bleeding, bruising, infection and medication side effects.  The patient wishes to proceed and has given written consent.  The patient was placed in a Seated position.  The Left superior medial scapular area, left trapezius area left suboccipital area area was marked and prepped with Betadine.  It was entered with a 25-gauge 1-1/2 inch needle and 1 mL of 1% lidocaine was injected into each of 3 trigger points, after negative draw back for  blood.  The patient tolerated the procedure well.  Post procedure instructions were given.

## 2015-10-23 NOTE — Patient Instructions (Signed)
Finish the Voltaren capsules and use the in neck brace for another week, if not much better call to schedule left C4-C5 medial branch blocks

## 2015-10-29 LAB — 6-ACETYLMORPHINE,TOXASSURE ADD
6-ACETYLMORPHINE: NEGATIVE
6-acetylmorphine: NOT DETECTED ng/mg creat

## 2015-10-29 LAB — TOXASSURE SELECT,+ANTIDEPR,UR

## 2015-10-30 NOTE — Progress Notes (Signed)
Urine drug screen for this encounter is consistent for prescribed medication 

## 2015-11-03 ENCOUNTER — Encounter: Payer: Self-pay | Admitting: Registered Nurse

## 2015-11-03 ENCOUNTER — Ambulatory Visit: Payer: Medicare Other | Admitting: Registered Nurse

## 2015-11-03 ENCOUNTER — Encounter: Payer: Medicare Other | Attending: Physical Medicine & Rehabilitation | Admitting: Registered Nurse

## 2015-11-03 VITALS — BP 153/61 | HR 75 | Resp 14

## 2015-11-03 DIAGNOSIS — Z5181 Encounter for therapeutic drug level monitoring: Secondary | ICD-10-CM | POA: Diagnosis not present

## 2015-11-03 DIAGNOSIS — Z79899 Other long term (current) drug therapy: Secondary | ICD-10-CM

## 2015-11-03 DIAGNOSIS — M961 Postlaminectomy syndrome, not elsewhere classified: Secondary | ICD-10-CM | POA: Diagnosis not present

## 2015-11-03 DIAGNOSIS — M5412 Radiculopathy, cervical region: Secondary | ICD-10-CM | POA: Diagnosis not present

## 2015-11-03 DIAGNOSIS — G894 Chronic pain syndrome: Secondary | ICD-10-CM

## 2015-11-03 DIAGNOSIS — M546 Pain in thoracic spine: Secondary | ICD-10-CM | POA: Diagnosis not present

## 2015-11-03 DIAGNOSIS — M549 Dorsalgia, unspecified: Secondary | ICD-10-CM

## 2015-11-03 MED ORDER — MORPHINE SULFATE ER 15 MG PO TBCR: 15.0000 mg | EXTENDED_RELEASE_TABLET | Freq: Three times a day (TID) | ORAL | Status: DC

## 2015-11-03 NOTE — Progress Notes (Signed)
Subjective:    Patient ID: [name], female    DOB: July 31, 1950, 65 y.o.   MRN: [id number]  HPI: [name] is a 65 year old female who returns for follow up for chronic pain and medication refill. She states her pain is located between her shoulder blades and her mid-back. She rates her pain 6. Her current exercise regime is walking. S/P Trigger point injection with good relief noted.  Pain Inventory Average Pain 7 Pain Right Now 6 My pain is constant, sharp, burning, stabbing and aching  In the last 24 hours, has pain interfered with the following? General activity 7 Relation with others 7 Enjoyment of life 6 What TIME of day is your pain at its worst? daytime, night Sleep (in general) Fair  Pain is worse with: walking, bending, sitting, inactivity, standing and some activites Pain improves with: rest, heat/ice, therapy/exercise, pacing activities, medication, TENS and injections Relief from Meds: 5  Mobility walk without assistance how many minutes can you walk? 10-15 ability to climb steps?  yes do you drive?  yes Do you have any goals in this area?  yes  Function employed # of hrs/week 10-12 I need assistance with the following:  meal prep, household duties and shopping Do you have any goals in this area?  yes  Neuro/Psych bladder control problems numbness tremor spasms depression  Prior Studies Any changes since last visit?  no  Physicians involved in your care Any changes since last visit?  no   Family History  Problem Relation Age of Onset  . Breast cancer Mother   . Diabetes Mother   . Heart disease Mother   . Diabetes Father   . Heart disease Father   . Stomach cancer Maternal Uncle   . Breast cancer Paternal Aunt   . Colon cancer Neg Hx   . Emphysema Father   . Allergies Mother   . Allergies Father    Social History   Social History  . Marital Status: Married    Spouse Name: N/A  . Number of Children: N/A  . Years of  Education: N/A   Occupational History  . Bookkeeper    Social History Main Topics  . Smoking status: Never Smoker   . Smokeless tobacco: Never Used  . Alcohol Use: No  . Drug Use: No  . Sexual Activity: Not Asked   Other Topics Concern  . None   Social History Narrative   Past Surgical History  Procedure Laterality Date  . Appendectomy  1988  . Cholecystectomy  2005  . Tubal ligation  1979  . Tonsillectomy and adenoidectomy    . Anterior cervical decomp/discectomy fusion  2009, 1993    first procedure in ~ 1993/Dr Botero  . Nasal sinus surgery  2006  . Abdominal hysterectomy  1988   Past Medical History  Diagnosis Date  . GERD (gastroesophageal reflux disease)   . Unspecified essential hypertension   . Fibromyalgia   . B12 deficiency   . Depression   . Irritable bowel syndrome   . Hyperlipemia   . Migraines   . HYPOTHYROIDISM 05/29/2007  . Type II or unspecified type diabetes mellitus with neurological manifestations, not stated as uncontrolled 04/07/2013  . Generalized hyperhidrosis 04/17/2014   BP 153/61 mmHg  Pulse 75  Resp 14  SpO2 97%  Opioid Risk Score:   Fall Risk Score:  `1  Depression screen [facility] 2/9  Depression screen [facility] 2/9 10/23/2015 10/05/2015 02/04/2015 09/11/2014 10/15/2013  Decreased  Interest 2 2 0 2 0  Down, Depressed, Hopeless 0 0 0 0 0  PHQ - 2 Score 2 2 0 2 0  Altered sleeping - 3 - 0 -  Tired, decreased energy - 1 - 2 -  Change in appetite - 0 - 0 -  Feeling bad or failure about yourself  - 0 - 0 -  Trouble concentrating - 1 - 1 -  Moving slowly or fidgety/restless - 0 - 0 -  Suicidal thoughts - 0 - 0 -  PHQ-9 Score - 7 - 5 -  Difficult doing work/chores - Somewhat difficult - - -     Review of Systems  Constitutional:       Bladder control problems   Cardiovascular:       Limb swelling   Neurological: Positive for tremors and numbness.       Spasms   Psychiatric/Behavioral: Positive for dysphoric mood.  All other systems  reviewed and are negative.      Objective:   Physical Exam  Constitutional: She is oriented to person, place, and time. She appears well-developed and well-nourished.  HENT:  Head: Normocephalic and atraumatic.  Neck: Normal range of motion. Neck supple.  Cardiovascular: Normal rate and regular rhythm.   Pulmonary/Chest: Effort normal and breath sounds normal.  Musculoskeletal:  Normal Muscle Bulk and Muscle Testing Reveals: Upper Extremities: Full ROM and Muscle Strength 5/5 Bilateral AC Joint Tenderness Thoracic Paraspinal Tenderness: T-1- T-2 T-7- T-9 Lower Extremities: Full ROM and Muscle Strength 5/5 Arises from chair with ease Narrow Based Gait  Neurological: She is alert and oriented to person, place, and time.  Skin: Skin is warm and dry.  Psychiatric: She has a normal mood and affect.  Nursing note and vitals reviewed.         Assessment & Plan:  1. Cervical postlaminectomy syndrome: with neck and shoulder pain. Continue exercise routine and using heat therapy. Continue MS Contin 15 mg one tablet three times a day #90.  We will continue the opioid monitoring program, this consists of regular clinic visits, examinations, urine drug screen, pill counts as well as use of [place] Controlled Substance reporting System. 2. Fibromyalgia: Continue with Gabapentin, activity and exercise regime. 3. Bilateral Shoulder Pain: Continue HEP and alternate heat and ice therapy 4. Left Knee Pain/ Mild Degenerative Changes: No complaints today.Continue Voltaren Gel.  15 minutes of face to face patient care time was spent during this visit. All questions were encouraged and answered.   F/U in 1 month

## 2015-11-05 DIAGNOSIS — S92405A Nondisplaced unspecified fracture of left great toe, initial encounter for closed fracture: Secondary | ICD-10-CM | POA: Diagnosis not present

## 2015-11-16 ENCOUNTER — Other Ambulatory Visit: Payer: Self-pay | Admitting: Family Medicine

## 2015-11-16 ENCOUNTER — Other Ambulatory Visit: Payer: Self-pay | Admitting: Physical Medicine & Rehabilitation

## 2015-11-17 ENCOUNTER — Encounter: Payer: Self-pay | Admitting: Gastroenterology

## 2015-11-17 ENCOUNTER — Telehealth: Payer: Self-pay | Admitting: *Deleted

## 2015-11-17 ENCOUNTER — Ambulatory Visit (INDEPENDENT_AMBULATORY_CARE_PROVIDER_SITE_OTHER): Payer: Medicare Other | Admitting: Gastroenterology

## 2015-11-17 VITALS — BP 110/70 | HR 68 | Ht 66.0 in | Wt 220.6 lb

## 2015-11-17 DIAGNOSIS — Z1211 Encounter for screening for malignant neoplasm of colon: Secondary | ICD-10-CM

## 2015-11-17 DIAGNOSIS — K219 Gastro-esophageal reflux disease without esophagitis: Secondary | ICD-10-CM

## 2015-11-17 DIAGNOSIS — R1310 Dysphagia, unspecified: Secondary | ICD-10-CM

## 2015-11-17 NOTE — Telephone Encounter (Signed)
LM for the patient with her test intructions. [facility], arrive at 9:45 am. Test is 10:00 am,  NPO 3 hours. 2. Esophageal manometry PH 24 hr-[facility] Endoscopy department, Date  6-12 at 10:30 am, Arrive at 10:00 am. Have nothing by mouth 6 hours prior to the test.  Tried to scheduled tests when the patient was there but was on hold with Radiology and the Endo department too long.

## 2015-11-17 NOTE — Patient Instructions (Signed)
We will call  You with the dates for the tests. 1. Baarium Swallow 2. Esophageal Manometry 24 HR PH.

## 2015-11-17 NOTE — Progress Notes (Signed)
HPI :  65 y/o female presenting with symptoms of dysphagia and GERD. She was previously seen by Dr. Deatra Ina in 2012 for these symptoms, she has not been seen since that time. She has a history of DM and fibromyalgia, as well as reflux.   She reports longstanding dysphagia. She feels dysphagia in her sternal notch and substernal area, with discomfort when it occurs. It occurs daily with most meals. She experiences dysphagia to both solids and liquids. She has had 2 EGDs with dilation over the years, the last in 2012, and did not think it helped too much at the time. She had also had it done remotely without any clear benefit.   She reports ongoing heartburn, has had it longstanding. She has been on prilosec, prevacid, nexium, and most PPIs. She reports some short lived benefit on these, however over the past year she has had not much relief from the heartburn at despite anything she has taken. She is not taking anything at this time. She was last taking twice daily prilosec about 3 months ago which she states did not help. She takes TUMS which helps her symptoms when used. She has occasionaly regurgitation and ongoing belching which bothers her. She reports reflux both during daytime and at night as well. She thinks if she eats her symptoms are less. No known history of Barrett's.   She had a normal gastric emptying study in 2008. She reports a prior barium study but no records available. She has not had a prior pH test or manometry done.   No FH of esophageal or colon cancer.   She takes morphine for chronic neck pain.   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 GERD (gastroesophageal reflux disease)   . Unspecified essential hypertension   . Fibromyalgia   . B12 deficiency   . Depression   . Irritable bowel syndrome   . Hyperlipemia   . Migraines   .  HYPOTHYROIDISM 05/29/2007  . Type II or unspecified type diabetes mellitus with neurological manifestations, not stated as uncontrolled 04/07/2013  . Generalized hyperhidrosis 04/17/2014     Past Surgical History  Procedure Laterality Date  . Appendectomy  1988  . Cholecystectomy  2005  . Tubal ligation  1979  . Tonsillectomy and adenoidectomy    . Anterior cervical decomp/discectomy fusion  2009, 1993    first procedure in ~ 1993/Dr Botero  . Nasal sinus surgery  2006  . Abdominal hysterectomy  1988   Family History  Problem Relation Age of Onset  . Breast cancer Mother   . Diabetes Mother   . Heart disease Mother   . Diabetes Father   . Heart disease Father   . Stomach cancer Maternal Uncle   . Breast cancer Paternal Aunt   . Colon cancer Neg Hx   . Emphysema Father   . Allergies Mother   . Allergies Father    Social History  Substance Use Topics  . Smoking status: Never Smoker   . Smokeless tobacco: Never Used  . Alcohol Use: No   Current Outpatient Prescriptions  Medication Sig Dispense Refill  . aspirin 81 MG tablet Take 81 mg by mouth daily.      . B Complex Vitamins (B COMPLEX 100 PO) Take 1 tablet by mouth daily.    . Biotin 1000 MCG tablet Take 1,000 mcg by mouth  daily.    . Blood Glucose Monitoring Suppl (ACCU-CHEK [name] PLUS) W/DEVICE KIT Check blood sugar once a day and as directed. Dx E11.40 1 kit 0  . Calcium Carbonate (CALCIUM 600 PO) Take 600 mg by mouth 2 (two) times daily.     . cetirizine (ZYRTEC) 10 MG tablet Take 10 mg by mouth daily.      . Cholecalciferol (VITAMIN D) 2000 UNITS tablet Take 2,000 Units by mouth daily.      . citalopram (CELEXA) 40 MG tablet Take 1 tablet by mouth  daily 90 tablet 1  . clindamycin (CLEOCIN) 300 MG capsule Take 600 mg by mouth. before dental appointments    . Coenzyme Q-10 100 MG capsule Take 100 mg by mouth daily.    . diclofenac sodium (VOLTAREN) 1 % GEL Apply 4 g topically 4 (four) times daily. 5 Tube prn  .  docusate sodium (COLACE) 100 MG capsule Take 200 mg by mouth at bedtime.     . gabapentin (NEURONTIN) 600 MG tablet Take 1 tablet by mouth 3  times daily 270 tablet 0  . glucose blood (ACCU-CHEK [name] PLUS) test strip Check blood sugar once a day and as directed. Dx E11.40 100 each 3  . Lancet Devices (ACCU-CHEK SOFTCLIX) lancets Check blood sugar once a day and as directed. Dx E11.40, Normal 1 each 0  . levothyroxine (SYNTHROID, LEVOTHROID) 50 MCG tablet Take 1 tablet by mouth  daily 90 tablet 1  . loratadine (CLARITIN) 10 MG tablet Take 10 mg by mouth daily.    . magnesium gluconate (MAGONATE) 500 MG tablet Take 500 mg by mouth daily.    . methocarbamol (ROBAXIN) 500 MG tablet TAKE ONE (1) TABLET BY MOUTH EVERY EIGHTHOURS AS NEEDED FOR MUSCLE SPASMS 180 tablet 3  . mirtazapine (REMERON) 15 MG tablet Take 1 tablet by mouth at  bedtime 90 tablet 1  . morphine (MS CONTIN) 15 MG 12 hr tablet Take 1 tablet (15 mg total) by mouth 3 (three) times daily. 90 tablet 0  . mupirocin ointment (BACTROBAN) 2 % PLACE 1 APPLICATION INTO THE NOSE 2 TIMES DAILY FOR 1 WEEK 22 g 0  . oxybutynin (DITROPAN-XL) 10 MG 24 hr tablet TAKE ONE TABLET BY MOUTH AT BEDTIME 90 tablet 0  . senna-docusate (PERI-COLACE) 8.6-50 MG per tablet Take 4 tablets by mouth at bedtime as needed for mild constipation.      No current facility-administered medications for this visit.   Allergies  Allergen Reactions  . Rizatriptan Benzoate Anaphylaxis    Throat and Tongue Swelling Closed  . Sumatriptan Other (See Comments)    Throat closed & Tongue Swelling  . Tizanidine Shortness Of Breath  . Clarithromycin   . Cyclobenzaprine Hcl     REACTION: THROAT CLOSED,TOUNGE SWELLING  . Doxycycline   . Duloxetine   . Metronidazole   . Naproxen Sodium Hives  . Oxycodone Hcl Diarrhea    Vomiting  . Penicillins     TOLERATES CEPHALOSPORINS  . Rosuvastatin     REACTION: MUSCLE ACHES  . Tramadol   . Venlafaxine Hives  . Voltaren  [Diclofenac Sodium] Hives    oral     Review of Systems: All systems reviewed and negative except where noted in HPI.   Lab Results  Component Value Date   WBC 8.3 01/13/2014   HGB 12.3 01/13/2014   HCT 36.5 01/13/2014   MCV 90.5 01/13/2014   PLT 257.0 01/13/2014    Lab Results  Component Value Date  CREATININE 0.99 10/20/2014   BUN 16 10/20/2014   NA 137 10/20/2014   K 4.1 10/20/2014   CL 102 10/20/2014   CO2 32 10/20/2014    Lab Results  Component Value Date   ALT 26 01/13/2014   AST 27 01/13/2014   ALKPHOS 88 01/13/2014   BILITOT 0.6 01/13/2014     Physical Exam: BP 110/70 mmHg  Pulse 68  Ht 5' 6"  (1.676 m)  Wt 220 lb 9.6 oz (100.064 kg)  BMI 35.62 kg/m2 Constitutional: Pleasant,well-developed, female in no acute distress. HEENT: Normocephalic and atraumatic. Conjunctivae are normal. No scleral icterus. Neck supple.  Cardiovascular: Normal rate, regular rhythm.  Pulmonary/chest: Effort normal and breath sounds normal. No wheezing, rales or rhonchi. Abdominal: Soft, nondistended, protuberant nontender. Bowel sounds active throughout. There are no masses palpable. No hepatomegaly. Extremities: no edema Lymphadenopathy: No cervical adenopathy noted. Neurological: Alert and oriented to person place and time. Skin: Skin is warm and dry. No rashes noted. Psychiatric: Normal mood and affect. Behavior is normal.   ASSESSMENT AND PLAN: 65 y/o female with reported long-standing history of GERD and dysphagia. GERD historically not helped much at all from prior PPIs and H2 blockers, using TUMS PRN with ongoing symptoms. Also with dysphagia to both solids and liquids, EGD with ? Stricture at the GEJ and dilation performed previously provided no benefit. She did not have any overt evidence of EoE on last EGD.   I discussed differential for dysphagia with her. Recommend a barium swallow with tablet to see if we can identify where he dysphagia is occuring, in light of  reported stricture at the GEJ in the past to see if this is present. I otherwise recommend esophageal manometry to assess for dysmotility. I discussed risks / benefits of manometry and she wished to proceed. At the same time, given her failure of prior antacids in controlling her GERD, recommend a pH impedance study OFF to more objectively evaluate for the presence of pathology reflux, perhaps she has hypersensitive esophagus or functional heartburn.   She agreed with the plan, I will let her know results.   Of note, due for recall colonoscopy in 2018 for colon cancer screening. She agreed.   [place] Cellar, MD [facility] Gastro[facility]ology Pager [phone]

## 2015-11-20 DIAGNOSIS — S92405D Nondisplaced unspecified fracture of left great toe, subsequent encounter for fracture with routine healing: Secondary | ICD-10-CM | POA: Diagnosis not present

## 2015-11-22 DIAGNOSIS — J9610 Chronic respiratory failure, unspecified whether with hypoxia or hypercapnia: Secondary | ICD-10-CM | POA: Diagnosis not present

## 2015-11-26 ENCOUNTER — Ambulatory Visit (HOSPITAL_COMMUNITY)
Admission: RE | Admit: 2015-11-26 | Discharge: 2015-11-26 | Disposition: A | Discharge status: Home / Self Care | Payer: Medicare Other | Source: Ambulatory Visit | Attending: Gastroenterology | Admitting: Gastroenterology

## 2015-11-26 DIAGNOSIS — K224 Dyskinesia of esophagus: Secondary | ICD-10-CM | POA: Insufficient documentation

## 2015-11-26 DIAGNOSIS — R1310 Dysphagia, unspecified: Secondary | ICD-10-CM | POA: Diagnosis not present

## 2015-11-26 DIAGNOSIS — K219 Gastro-esophageal reflux disease without esophagitis: Secondary | ICD-10-CM

## 2015-11-26 DIAGNOSIS — R4702 Dysphasia: Secondary | ICD-10-CM | POA: Diagnosis not present

## 2015-11-26 DIAGNOSIS — K449 Diaphragmatic hernia without obstruction or gangrene: Secondary | ICD-10-CM | POA: Insufficient documentation

## 2015-12-01 ENCOUNTER — Encounter: Payer: Medicare Other | Attending: Physical Medicine & Rehabilitation | Admitting: Registered Nurse

## 2015-12-01 ENCOUNTER — Encounter: Payer: Self-pay | Admitting: Registered Nurse

## 2015-12-01 ENCOUNTER — Ambulatory Visit: Payer: Medicare Other | Admitting: Physical Medicine & Rehabilitation

## 2015-12-01 VITALS — BP 133/60 | HR 70

## 2015-12-01 DIAGNOSIS — G894 Chronic pain syndrome: Secondary | ICD-10-CM

## 2015-12-01 DIAGNOSIS — M961 Postlaminectomy syndrome, not elsewhere classified: Secondary | ICD-10-CM

## 2015-12-01 DIAGNOSIS — M25511 Pain in right shoulder: Secondary | ICD-10-CM

## 2015-12-01 DIAGNOSIS — M545 Low back pain, unspecified: Secondary | ICD-10-CM

## 2015-12-01 DIAGNOSIS — M797 Fibromyalgia: Secondary | ICD-10-CM

## 2015-12-01 DIAGNOSIS — M791 Myalgia: Secondary | ICD-10-CM

## 2015-12-01 DIAGNOSIS — IMO0001 Reserved for inherently not codable concepts without codable children: Secondary | ICD-10-CM

## 2015-12-01 DIAGNOSIS — M609 Myositis, unspecified: Secondary | ICD-10-CM

## 2015-12-01 DIAGNOSIS — Z79899 Other long term (current) drug therapy: Secondary | ICD-10-CM

## 2015-12-01 DIAGNOSIS — Z5181 Encounter for therapeutic drug level monitoring: Secondary | ICD-10-CM

## 2015-12-01 MED ORDER — MORPHINE SULFATE ER 15 MG PO TBCR: 15.0000 mg | EXTENDED_RELEASE_TABLET | Freq: Three times a day (TID) | ORAL | Status: DC

## 2015-12-01 NOTE — Progress Notes (Signed)
Subjective:    Patient ID: [name], female    DOB: Nov 28, 1950, 65 y.o.   MRN: [id number]  HPI: [name] is a 65 year old female who returns for follow up for chronic pain and medication refill. She states her pain is located in her right shoulder and mid- lowerback. She rates her pain 5. Her current exercise regime is walking. Also states on Nov 04, 2015 she was getting out of her chair when her foot caught on the rug, she fell and landed on her knees. She was able to pick herself up. She went to [facility] after car, she had an x-rat her left great toe was fractured. She wore open shoe for two weeks she states. Educated on falls prevention.   Pain Inventory Average Pain 6 Pain Right Now 5 My pain is constant, burning and aching  In the last 24 hours, has pain interfered with the following? General activity 7 Relation with others 7 Enjoyment of life 7 What TIME of day is your pain at its worst? morning and night Sleep (in general) Fair  Pain is worse with: walking, bending, sitting, inactivity, standing and some activites Pain improves with: rest, heat/ice, therapy/exercise, pacing activities, medication, TENS and injections Relief from Meds: 5  Mobility walk without assistance how many minutes can you walk? 10-15 Do you have any goals in this area?  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  yes  Neuro/Psych bladder control problems weakness tremor tingling depression  Prior Studies Any changes since last visit?  no  Physicians involved in your care Any changes since last visit?  no   Family History  Problem Relation Age of Onset  . Breast cancer Mother   . Diabetes Mother   . Heart disease Mother   . Diabetes Father   . Heart disease Father   . Stomach cancer  Maternal Uncle   . Breast cancer Paternal Aunt   . Colon cancer Neg Hx   . Emphysema Father   . Allergies Mother   . Allergies Father    Social History   Social History  . Marital Status: Married    Spouse Name: N/A  . Number of Children: N/A  . Years of Education: N/A   Occupational History  . Bookkeeper    Social History Main Topics  . Smoking status: Never Smoker   . Smokeless tobacco: Never Used  . Alcohol Use: No  . Drug Use: No  . Sexual Activity: Not Asked   Other Topics Concern  . None   Social History Narrative   Past Surgical History  Procedure Laterality Date  . Appendectomy  1988  . Cholecystectomy  2005  . Tubal ligation  1979  . Tonsillectomy and adenoidectomy    . Anterior cervical decomp/discectomy fusion  2009, 1993    first procedure in ~ 1993/Dr Botero  . Nasal sinus surgery  2006  . Abdominal hysterectomy  1988   Past Medical History  Diagnosis Date  . GERD (gastroesophageal reflux disease)   . Unspecified essential hypertension   . Fibromyalgia   . B12 deficiency   . Depression   . Irritable bowel syndrome   . Hyperlipemia   . Migraines   . HYPOTHYROIDISM 05/29/2007  . Type II or unspecified type diabetes mellitus with  neurological manifestations, not stated as uncontrolled 04/07/2013  . Generalized hyperhidrosis 04/17/2014   BP 133/60 mmHg  Pulse 70  SpO2 97%  Opioid Risk Score:   Fall Risk Score:  `1  Depression screen PHQ 2/9  Depression screen [facility] 2/9 10/23/2015 10/05/2015 02/04/2015 09/11/2014 10/15/2013  Decreased Interest 2 2 0 2 0  Down, Depressed, Hopeless 0 0 0 0 0  PHQ - 2 Score 2 2 0 2 0  Altered sleeping - 3 - 0 -  Tired, decreased energy - 1 - 2 -  Change in appetite - 0 - 0 -  Feeling bad or failure about yourself  - 0 - 0 -  Trouble concentrating - 1 - 1 -  Moving slowly or fidgety/restless - 0 - 0 -  Suicidal thoughts - 0 - 0 -  PHQ-9 Score - 7 - 5 -  Difficult doing work/chores - Somewhat difficult - - -     Review of Systems     Objective:   Physical Exam  Constitutional: She is oriented to person, place, and time. She appears well-developed and well-nourished.  HENT:  Head: Normocephalic.  Neck: Normal range of motion. Neck supple.  Cardiovascular: Normal rate and regular rhythm.   Pulmonary/Chest: Effort normal and breath sounds normal.  Musculoskeletal:  Normal Muscle Bulk and Muscle Testing Reveals: Upper Extremities: Full ROM and Muscle Strength 5/5 Right AC Joint Tenderness Right Scapula Tenderness Thoracic Paraspinal Tenderness: T-7- T-9 Lumbar Paraspinal Tenderness: L-4- L-5 Lower Extremities: Full ROM and Muscle Strength 5/5 Arises from chair with ease Narrow Based Gait  Neurological: She is alert and oriented to person, place, and time.  Skin: Skin is warm and dry.  Psychiatric: She has a normal mood and affect.  Nursing note and vitals reviewed.         Assessment & Plan:  1. Cervical postlaminectomy syndrome: with neck and shoulder pain. Continue exercise routine and using heat therapy. Continue MS Contin 15 mg one tablet three times a day #90.  We will continue the opioid monitoring program, this consists of regular clinic visits, examinations, urine drug screen, pill counts as well as use of [place] Controlled Substance reporting System. 2. Fibromyalgia: Continue with Gabapentin, activity and exercise regime. 3. Right Shoulder Pain: Continue HEP and alternate heat and ice therapy 4. Left Knee Pain/ Mild Degenerative Changes: No complaints today.Continue Voltaren Gel.  15 minutes of face to face patient care time was spent during this visit. All questions were encouraged and answered.   F/U in 1 month

## 2015-12-03 ENCOUNTER — Telehealth: Payer: Self-pay | Admitting: Family Medicine

## 2015-12-03 NOTE — Telephone Encounter (Signed)
Pt has appt on 12/04/15 at 1pm with Avie Echevaria NP.

## 2015-12-03 NOTE — Telephone Encounter (Signed)
[facility] Patient Name: [name] DOB: 08-30-1950 Initial Comment boils and carbuncles on her groin area, fever 99.7 with tylenol, last night it was 100.7 Nurse Assessment Nurse: Dimas Chyle, RN, Dellis Filbert Date/Time (Eastern Time): 12/03/2015 10:37:27 AM Confirm and document reason for call. If symptomatic, describe symptoms. You must click the next button to save text entered. ---Boils and carbuncles on her groin area, fever 99.7 with tylenol, last night it was 100.7. Boils for 2 weeks and fever started yesterday morning. 5-6 boils. Has the patient traveled out of the country within the last 30 days? ---No Does the patient have any new or worsening symptoms? ---Yes Will a triage be completed? ---Yes Related visit to physician within the last 2 weeks? ---No Does the PT have any chronic conditions? (i.e. diabetes, asthma, etc.) ---Yes List chronic conditions. ---Diabetes type 2 Is this a behavioral health or substance abuse call? ---No Guidelines Guideline Title Affirmed Question Affirmed Notes Boil (Skin Abscess) 2 or more boils Final Disposition User See Physician within 24 Hours Dimas Chyle, RN, [employer] Referrals REFERRED TO PCP OFFICE Disagree/Comply: Leta Baptist

## 2015-12-04 ENCOUNTER — Encounter: Payer: Self-pay | Admitting: Internal Medicine

## 2015-12-04 ENCOUNTER — Ambulatory Visit: Payer: Self-pay | Admitting: Internal Medicine

## 2015-12-04 ENCOUNTER — Ambulatory Visit (INDEPENDENT_AMBULATORY_CARE_PROVIDER_SITE_OTHER): Payer: Medicare Other | Admitting: Internal Medicine

## 2015-12-04 VITALS — BP 122/70 | HR 65 | Temp 98.6°F | Wt 217.0 lb

## 2015-12-04 DIAGNOSIS — E119 Type 2 diabetes mellitus without complications: Secondary | ICD-10-CM

## 2015-12-04 DIAGNOSIS — L02214 Cutaneous abscess of groin: Secondary | ICD-10-CM

## 2015-12-04 MED ORDER — SULFAMETHOXAZOLE-TRIMETHOPRIM 800-160 MG PO TABS: 1.0000 | ORAL_TABLET | Freq: Two times a day (BID) | ORAL | Status: DC

## 2015-12-04 NOTE — Progress Notes (Signed)
Subjective:    Patient ID: [name], female    DOB: [birth year], 65 y.o.   MRN: [id number]  HPI  Patient presents to the clinic today with complaints of boils in her bilateral groin. She first noticed this 2 weeks ago. One of the boils is draining thick yellow pus. The boils are tender to touch. She has noticed some redness but no warmth. She has run fevers up to 100.7. She has tried Tylenol with some relief. She reports she has a history of recurrent boils which typically drain on their own. She has a history of diabetes type 2 but well controlled, last A1C reviewed.  Review of Systems  Past Medical History  Diagnosis Date  . GERD (gastroesophageal reflux disease)   . Unspecified essential hypertension   . Fibromyalgia   . B12 deficiency   . Depression   . Irritable bowel syndrome   . Hyperlipemia   . Migraines   . HYPOTHYROIDISM 05/29/2007  . Type II or unspecified type diabetes mellitus with neurological manifestations, not stated as uncontrolled 04/07/2013  . Generalized hyperhidrosis 04/17/2014    Current Outpatient Prescriptions  Medication Sig Dispense Refill  . aspirin 81 MG tablet Take 81 mg by mouth daily.      . B Complex Vitamins (B COMPLEX 100 PO) Take 1 tablet by mouth daily.    . Biotin 1000 MCG tablet Take 1,000 mcg by mouth daily.    . Blood Glucose Monitoring Suppl (ACCU-CHEK [name] PLUS) W/DEVICE KIT Check blood sugar once a day and as directed. Dx E11.40 1 kit 0  . Calcium Carbonate (CALCIUM 600 PO) Take 600 mg by mouth 2 (two) times daily.     . cetirizine (ZYRTEC) 10 MG tablet Take 10 mg by mouth daily.      . Cholecalciferol (VITAMIN D) 2000 UNITS tablet Take 2,000 Units by mouth daily.      . citalopram (CELEXA) 40 MG tablet Take 1 tablet by mouth  daily 90 tablet 1  . clindamycin (CLEOCIN) 300 MG capsule Take 600 mg by mouth. before dental appointments    . Coenzyme Q-10 100 MG capsule Take 100 mg by mouth daily.    . diclofenac sodium (VOLTAREN) 1 %  GEL Apply 4 g topically 4 (four) times daily. 5 Tube prn  . docusate sodium (COLACE) 100 MG capsule Take 200 mg by mouth at bedtime.     . gabapentin (NEURONTIN) 600 MG tablet Take 1 tablet by mouth 3  times daily 270 tablet 0  . glucose blood (ACCU-CHEK [name] PLUS) test strip Check blood sugar once a day and as directed. Dx E11.40 100 each 3  . Lancet Devices (ACCU-CHEK SOFTCLIX) lancets Check blood sugar once a day and as directed. Dx E11.40, Normal 1 each 0  . levothyroxine (SYNTHROID, LEVOTHROID) 50 MCG tablet Take 1 tablet by mouth  daily 90 tablet 1  . loratadine (CLARITIN) 10 MG tablet Take 10 mg by mouth daily.    . magnesium gluconate (MAGONATE) 500 MG tablet Take 500 mg by mouth daily.    . methocarbamol (ROBAXIN) 500 MG tablet TAKE ONE (1) TABLET BY MOUTH EVERY EIGHTHOURS AS NEEDED FOR MUSCLE SPASMS 180 tablet 3  . mirtazapine (REMERON) 15 MG tablet Take 1 tablet by mouth at  bedtime 90 tablet 1  . morphine (MS CONTIN) 15 MG 12 hr tablet Take 1 tablet (15 mg total) by mouth 3 (three) times daily. 90 tablet 0  . mupirocin ointment (BACTROBAN) 2 % PLACE  1 APPLICATION INTO THE NOSE 2 TIMES DAILY FOR 1 WEEK 22 g 0  . oxybutynin (DITROPAN-XL) 10 MG 24 hr tablet TAKE ONE TABLET BY MOUTH AT BEDTIME 90 tablet 0  . senna-docusate (PERI-COLACE) 8.6-50 MG per tablet Take 4 tablets by mouth at bedtime as needed for mild constipation.      No current facility-administered medications for this visit.    Allergies  Allergen Reactions  . Rizatriptan Benzoate Anaphylaxis    Throat and Tongue Swelling Closed  . Sumatriptan Other (See Comments)    Throat closed & Tongue Swelling  . Tizanidine Shortness Of Breath  . Aleve [Naproxen Sodium] Hives  . Clarithromycin   . Cyclobenzaprine Hcl     REACTION: THROAT CLOSED,TOUNGE SWELLING  . Doxycycline   . Duloxetine   . Metronidazole   . Oxycodone Hcl Diarrhea    Vomiting  . Penicillins     TOLERATES CEPHALOSPORINS  . Rosuvastatin     REACTION:  MUSCLE ACHES  . Tramadol   . Venlafaxine Hives  . Voltaren [Diclofenac Sodium] Hives    oral    Family History  Problem Relation Age of Onset  . Breast cancer Mother   . Diabetes Mother   . Heart disease Mother   . Diabetes Father   . Heart disease Father   . Stomach cancer Maternal Uncle   . Breast cancer Paternal Aunt   . Colon cancer Neg Hx   . Emphysema Father   . Allergies Mother   . Allergies Father     Social History   Social History  . Marital Status: Married    Spouse Name: N/A  . Number of Children: N/A  . Years of Education: N/A   Occupational History  . Bookkeeper    Social History Main Topics  . Smoking status: Never Smoker   . Smokeless tobacco: Never Used  . Alcohol Use: No  . Drug Use: No  . Sexual Activity: Not on file   Other Topics Concern  . Not on file   Social History Narrative     Constitutional: Patient reports fever. Denies malaise, fatigue, headache or abrupt weight changes.  Skin: Patient reports boils in her groin. Denies rashes, or ulcercations.    No other specific complaints in a complete review of systems (except as listed in HPI above).   Objective:   Physical Exam   BP 122/70 mmHg  Pulse 65  Temp(Src) 98.6 F (37 C) (Oral)  Wt 217 lb (98.431 kg)  SpO2 98% Wt Readings from Last 3 Encounters:  12/04/15 217 lb (98.431 kg)  11/17/15 220 lb 9.6 oz (100.064 kg)  08/14/15 224 lb 12.8 oz (101.969 kg)    General: Appears her stated age, well developed, well nourished in NAD. Skin: 1 cm oval abscess noted in right groin. Thin yellow drainage, no odor. No surrounding cellulitis noted.  BMET    Component Value Date/Time   NA 137 10/20/2014 1516   K 4.1 10/20/2014 1516   CL 102 10/20/2014 1516   CO2 32 10/20/2014 1516   GLUCOSE 133* 10/20/2014 1516   BUN 16 10/20/2014 1516   CREATININE 0.99 10/20/2014 1516   CALCIUM 9.8 10/20/2014 1516   GFRNONAA 79.38 02/02/2010 1026   GFRAA  06/27/2009 2333    >60        The  eGFR has been calculated using the MDRD equation. This calculation has not been validated in all clinical situations. eGFR's persistently <60 mL/min signify possible Chronic Kidney Disease.  Lipid Panel     Component Value Date/Time   CHOL 175 01/13/2014 1037   TRIG 213.0* 01/13/2014 1037   HDL 42.80 01/13/2014 1037   CHOLHDL 4 01/13/2014 1037   VLDL 42.6* 01/13/2014 1037   LDLCALC 90 01/13/2014 1037    CBC    Component Value Date/Time   WBC 8.3 01/13/2014 1037   RBC 4.03 01/13/2014 1037   HGB 12.3 01/13/2014 1037   HCT 36.5 01/13/2014 1037   PLT 257.0 01/13/2014 1037   MCV 90.5 01/13/2014 1037   MCHC 33.5 01/13/2014 1037   RDW 13.1 01/13/2014 1037   LYMPHSABS 2.3 01/13/2014 1037   MONOABS 0.4 01/13/2014 1037   EOSABS 0.2 01/13/2014 1037   BASOSABS 0.0 01/13/2014 1037    Hgb A1C Lab Results  Component Value Date   HGBA1C 6.0 10/20/2014        Assessment & Plan:  Abscess, right groin:  Abscess is already open and draining eRX for Septra 1 tab twice a day for 7 days Continue warm compresses Tylenol as needed for pain and fever Return precautions given  Return to the clinic as needed or if symptoms persist or worsen  

## 2015-12-04 NOTE — Progress Notes (Signed)
Pre visit review using our clinic review tool, if applicable. No additional management support is needed unless otherwise documented below in the visit note. 

## 2015-12-04 NOTE — Patient Instructions (Signed)

## 2015-12-07 ENCOUNTER — Telehealth: Payer: Self-pay

## 2015-12-07 ENCOUNTER — Other Ambulatory Visit: Payer: Self-pay | Admitting: Internal Medicine

## 2015-12-07 MED ORDER — CEPHALEXIN 500 MG PO CAPS: 500.0000 mg | ORAL_CAPSULE | Freq: Three times a day (TID) | ORAL | Status: DC

## 2015-12-07 NOTE — Telephone Encounter (Signed)
Pt left v/m; pt was seen 12/04/15 and after taking Septra pt developed itchy red rash;pt stopped Septra on 12/05/15.pt request different abx to [facility]. Pt request cb.

## 2015-12-07 NOTE — Telephone Encounter (Signed)
Sent Keflex to pharmacy.

## 2015-12-23 DIAGNOSIS — J9610 Chronic respiratory failure, unspecified whether with hypoxia or hypercapnia: Secondary | ICD-10-CM | POA: Diagnosis not present

## 2016-01-04 ENCOUNTER — Encounter (HOSPITAL_COMMUNITY)
Admission: RE | Disposition: A | Discharge status: Home / Self Care | Payer: Self-pay | Source: Ambulatory Visit | Attending: Gastroenterology

## 2016-01-04 ENCOUNTER — Ambulatory Visit (HOSPITAL_COMMUNITY)
Admission: RE | Admit: 2016-01-04 | Discharge: 2016-01-04 | Disposition: A | Discharge status: Home / Self Care | Payer: Medicare Other | Source: Ambulatory Visit | Attending: Gastroenterology | Admitting: Gastroenterology

## 2016-01-04 DIAGNOSIS — R1310 Dysphagia, unspecified: Secondary | ICD-10-CM | POA: Diagnosis not present

## 2016-01-04 DIAGNOSIS — K219 Gastro-esophageal reflux disease without esophagitis: Secondary | ICD-10-CM | POA: Insufficient documentation

## 2016-01-04 HISTORY — PX: ESOPHAGEAL MANOMETRY: SHX5429

## 2016-01-04 HISTORY — PX: 24 HOUR PH STUDY: SHX5419

## 2016-01-04 SURGERY — MANOMETRY, ESOPHAGUS

## 2016-01-04 MED ORDER — LIDOCAINE VISCOUS 2 % MT SOLN
OROMUCOSAL | Status: AC
  Filled 2016-01-04: qty 15

## 2016-01-04 SURGICAL SUPPLY — 2 items
FACESHIELD LNG OPTICON STERILE (SAFETY) IMPLANT
GLOVE BIO SURGEON STRL SZ8 (GLOVE) ×6 IMPLANT

## 2016-01-04 NOTE — Progress Notes (Signed)
Esophageal Manometry done per protocol. Pt tolerated well without complication. PH Impedance probe inserted per protocol at 36.5 cm which is 5cm above LES upper border. Pt tolerated well. Catheter calibrated and checked. Pt instructed on use of recorder using teachback and verbalized understanding. Pt has been off of any PPIs or antacids for more than a week. Pt will return tomorrow 01/05/2016 at or after 0930 to have [place] probe removed and study downloaded. Reports to be sent to Dr Derby Line Cellar.

## 2016-01-05 ENCOUNTER — Encounter: Payer: Medicare Other | Attending: Physical Medicine & Rehabilitation | Admitting: Registered Nurse

## 2016-01-05 ENCOUNTER — Encounter (HOSPITAL_COMMUNITY): Payer: Self-pay | Admitting: Gastroenterology

## 2016-01-05 VITALS — BP 109/68 | HR 65 | Resp 17

## 2016-01-05 DIAGNOSIS — G894 Chronic pain syndrome: Secondary | ICD-10-CM | POA: Insufficient documentation

## 2016-01-05 DIAGNOSIS — M961 Postlaminectomy syndrome, not elsewhere classified: Secondary | ICD-10-CM

## 2016-01-05 DIAGNOSIS — Z5181 Encounter for therapeutic drug level monitoring: Secondary | ICD-10-CM | POA: Insufficient documentation

## 2016-01-05 DIAGNOSIS — M545 Low back pain, unspecified: Secondary | ICD-10-CM

## 2016-01-05 DIAGNOSIS — Z79899 Other long term (current) drug therapy: Secondary | ICD-10-CM | POA: Insufficient documentation

## 2016-01-05 DIAGNOSIS — M797 Fibromyalgia: Secondary | ICD-10-CM

## 2016-01-05 MED ORDER — MORPHINE SULFATE ER 15 MG PO TBCR: 15.0000 mg | EXTENDED_RELEASE_TABLET | Freq: Three times a day (TID) | ORAL | Status: DC

## 2016-01-05 NOTE — Progress Notes (Signed)
Subjective:    Patient ID: [name], female    DOB: 29-Jun-1950, 65 y.o.   MRN: [id number]  HPI: [name] is a 65 year old female who returns for follow up for chronic pain and medication refill. She states her pain is located in her mid- lowerback. She rates her pain 7. Her current exercise regime is walking.  Pain Inventory Average Pain 7 Pain Right Now 7 My pain is constant, burning and aching  In the last 24 hours, has pain interfered with the following? General activity 7 Relation with others 7 Enjoyment of life 7 What TIME of day is your pain at its worst? daytime, evening, night Sleep (in general) Fair  Pain is worse with: walking, bending, sitting, inactivity, standing and some activites Pain improves with: rest, heat/ice, therapy/exercise, pacing activities, medication, TENS and injections Relief from Meds: 4  Mobility walk without assistance ability to climb steps?  yes do you drive?  yes Do you have any goals in this area?  yes  Function employed # of hrs/week 10 disabled: date disabled 7/10 I need assistance with the following:  meal prep, household duties and shopping Do you have any goals in this area?  yes  Neuro/Psych bladder control problems weakness tremor spasms depression  Prior Studies Any changes since last visit?  yes swallow test  Physicians involved in your care Any changes since last visit?  yes Primary care . Neurologist .   Family History  Problem Relation Age of Onset  . Breast cancer Mother   . Diabetes Mother   . Heart disease Mother   . Diabetes Father   . Heart disease Father   . Stomach cancer Maternal Uncle   . Breast cancer Paternal Aunt   . Colon cancer Neg Hx   . Emphysema Father   . Allergies Mother   . Allergies Father    Social History   Social History  . Marital Status: Married    Spouse Name: N/A  . Number of Children: N/A  . Years of Education: N/A   Occupational History  . Bookkeeper     Social History Main Topics  . Smoking status: Never Smoker   . Smokeless tobacco: Never Used  . Alcohol Use: No  . Drug Use: No  . Sexual Activity: Not Asked   Other Topics Concern  . None   Social History Narrative   Past Surgical History  Procedure Laterality Date  . Appendectomy  1988  . Cholecystectomy  2005  . Tubal ligation  1979  . Tonsillectomy and adenoidectomy    . Anterior cervical decomp/discectomy fusion  2009, 1993    first procedure in ~ 1993/Dr Botero  . Nasal sinus surgery  2006  . Abdominal hysterectomy  1988  . Esophageal manometry N/A 01/04/2016    Procedure: ESOPHAGEAL MANOMETRY (EM) 24 HR PH;  Surgeon: Manus Gunning, MD;  Location: [facility];  Service: Gastroenterology;  Laterality: N/A;  . 24 hour ph study N/A 01/04/2016    Procedure: 24 HOUR PH STUDY;  Surgeon: Manus Gunning, MD;  Location: [facility];  Service: Gastroenterology;  Laterality: N/A;   Past Medical History  Diagnosis Date  . GERD (gastroesophageal reflux disease)   . Unspecified essential hypertension   . Fibromyalgia   . B12 deficiency   . Depression   . Irritable bowel syndrome   . Hyperlipemia   . Migraines   . HYPOTHYROIDISM 05/29/2007  . Type II or unspecified type diabetes  mellitus with neurological manifestations, not stated as uncontrolled 04/07/2013  . Generalized hyperhidrosis 04/17/2014   BP 109/68 mmHg  Pulse 65  Resp 17  SpO2 98%  Opioid Risk Score:   Fall Risk Score:  `1  Depression screen PHQ 2/9  Depression screen [facility] 2/9 10/23/2015 10/05/2015 02/04/2015 09/11/2014 10/15/2013  Decreased Interest 2 2 0 2 0  Down, Depressed, Hopeless 0 0 0 0 0  PHQ - 2 Score 2 2 0 2 0  Altered sleeping - 3 - 0 -  Tired, decreased energy - 1 - 2 -  Change in appetite - 0 - 0 -  Feeling bad or failure about yourself  - 0 - 0 -  Trouble concentrating - 1 - 1 -  Moving slowly or fidgety/restless - 0 - 0 -  Suicidal thoughts - 0 - 0 -  PHQ-9 Score - 7 - 5  -  Difficult doing work/chores - Somewhat difficult - - -     Review of Systems  Constitutional:       Bladder control problems   Cardiovascular:       Limb swelling  Skin: Positive for rash.  Neurological: Positive for tremors and weakness.       Spasms   Psychiatric/Behavioral: Positive for dysphoric mood.  All other systems reviewed and are negative.      Objective:   Physical Exam  Constitutional: She is oriented to person, place, and time. She appears well-developed and well-nourished.  HENT:  Head: Normocephalic and atraumatic.  Neck: Normal range of motion. Neck supple.  Cardiovascular: Normal rate and regular rhythm.   Pulmonary/Chest: Effort normal and breath sounds normal.  Musculoskeletal:  Normal Muscle Bulk and Muscle Testing Reveals: Upper Extremities: Full ROM and Muscle Strength 5/5 Thoracic Paraspinal Tenderness: T-7- T-11 Lower Extremities: Full ROM and Muscle Strength 5/5 Arises from chair with ease Narrow Based Gait  Neurological: She is alert and oriented to person, place, and time.  Skin: Skin is warm and dry.  Psychiatric: She has a normal mood and affect.  Nursing note and vitals reviewed.         Assessment & Plan:  1. Cervical postlaminectomy syndrome: with neck and shoulder pain. Continue exercise routine and using heat therapy. Continue MS Contin 15 mg one tablet three times a day #90.  We will continue the opioid monitoring program, this consists of regular clinic visits, examinations, urine drug screen, pill counts as well as use of [place] Controlled Substance reporting System. 2. Fibromyalgia: Continue with Gabapentin, activity and exercise regime. 3. Right Shoulder Pain: Continue HEP and alternate heat and ice therapy 4. Left Knee Pain/ Mild Degenerative Changes: No complaints today.Continue Voltaren Gel.  15 minutes of face to face patient care time was spent during this visit. All questions were encouraged and answered.    F/U in 1 month

## 2016-01-11 ENCOUNTER — Other Ambulatory Visit: Payer: Self-pay | Admitting: Family Medicine

## 2016-01-13 ENCOUNTER — Telehealth: Payer: Self-pay | Admitting: Gastroenterology

## 2016-01-13 NOTE — Telephone Encounter (Signed)
[name] can you let the patient know her pH study has been reviewed:  Results: - Study time of 24 hours: adequate study time - Demeester score of 24.4 (14.72 = upper limit of normal) - Symptom index of only 33% for acid reflux episodes (6 of 18 reported episodes of symptoms - 67% of reflux episodes reached the proximal esophagus  Summary and Interpretation: - study consistent with pathologic acid reflux disease, however interestingly, her reported symptoms during this exam did not correlate well with reflux episodes. Unclear if the patient has component of functional heartburn as well.    I would recommend treating her for GERD given this Demeester score. She has been on multiple PPIs in the past, has she been on Dexilant? If not we could start at 30mg  Dexilant daily to see if this helps. She is already on SSRI.  I am otherwise awaiting results of manometry, Dr. Silverio Decamp is helping me with this, and we will contact patient when we have that result. Thanks

## 2016-01-13 NOTE — Telephone Encounter (Signed)
Patient given results. She states she was on Dexilant several years ago. She cannot remember if it helped her.

## 2016-01-14 MED ORDER — DEXLANSOPRAZOLE 30 MG PO CPDR: 30.0000 mg | DELAYED_RELEASE_CAPSULE | Freq: Every day | ORAL | Status: DC

## 2016-01-14 NOTE — Telephone Encounter (Signed)
Patient notified and rx sent to pharmacy. 

## 2016-01-14 NOTE — Telephone Encounter (Signed)
Given her recent course with other PPIs which did not seem to help too much, why don't we try the [place] for a month or so and see how she does if she is willing to do it. thanks

## 2016-01-20 ENCOUNTER — Other Ambulatory Visit: Payer: Self-pay | Admitting: *Deleted

## 2016-01-20 MED ORDER — METHOCARBAMOL 500 MG PO TABS: ORAL_TABLET | ORAL | 3 refills | Status: DC

## 2016-01-22 DIAGNOSIS — J9610 Chronic respiratory failure, unspecified whether with hypoxia or hypercapnia: Secondary | ICD-10-CM | POA: Diagnosis not present

## 2016-01-27 ENCOUNTER — Telehealth: Payer: Self-pay | Admitting: Gastroenterology

## 2016-01-27 NOTE — Telephone Encounter (Signed)
[name] can you please let this patient know that her manometry report is done and it showed a normal exam. No evidence of dysmotility. She also had no evidence of stricture / stenosis on barium study previously, and has had prior empiric dilations, last EGD was in 2012 but no significant benefit reported.   If her dysphagia is bothering her significantly [name] a repeat EGD with empiric dilation however per her last clinic visit, she did not think this provided benefit historically. [name] has provided benefit for her symptoms since our last discussion.   [name] to see me in clinic first to discuss this issue prior to scheduling anything that would be fine. Can you please let her know.

## 2016-01-28 NOTE — Telephone Encounter (Signed)
Patient notified of recommendations She is doing better on Dexilant and declines EGD.  She understands to call back for EGD and or office visit with worsening symptoms.

## 2016-02-03 ENCOUNTER — Encounter: Payer: Medicare Other | Attending: Physical Medicine & Rehabilitation | Admitting: Registered Nurse

## 2016-02-03 ENCOUNTER — Encounter: Payer: Self-pay | Admitting: Registered Nurse

## 2016-02-03 VITALS — BP 135/73 | HR 68 | Resp 16

## 2016-02-03 DIAGNOSIS — M545 Low back pain, unspecified: Secondary | ICD-10-CM

## 2016-02-03 DIAGNOSIS — M797 Fibromyalgia: Secondary | ICD-10-CM

## 2016-02-03 DIAGNOSIS — M961 Postlaminectomy syndrome, not elsewhere classified: Secondary | ICD-10-CM | POA: Diagnosis not present

## 2016-02-03 DIAGNOSIS — Z79899 Other long term (current) drug therapy: Secondary | ICD-10-CM | POA: Diagnosis not present

## 2016-02-03 DIAGNOSIS — Z5181 Encounter for therapeutic drug level monitoring: Secondary | ICD-10-CM | POA: Diagnosis not present

## 2016-02-03 DIAGNOSIS — G894 Chronic pain syndrome: Secondary | ICD-10-CM

## 2016-02-03 MED ORDER — MORPHINE SULFATE ER 15 MG PO TBCR: 15.0000 mg | EXTENDED_RELEASE_TABLET | Freq: Three times a day (TID) | ORAL | 0 refills | Status: DC

## 2016-02-03 NOTE — Progress Notes (Signed)
Subjective:    Patient ID: [name], female    DOB: 01/15/1951, 65 y.o.   MRN: [id number]  HPI:  [name] is a 65 year old female who returns for follow up for chronic pain and medication refill. She states her pain is located in her mid- lowerback. She rates her pain 6. Her current exercise regime is walking and performing stretching exercises.  Pain Inventory Average Pain 6 Pain Right Now 6 My pain is constant, burning and aching  In the last 24 hours, has pain interfered with the following? General activity 7 Relation with others 7 Enjoyment of life 7 What TIME of day is your pain at its worst? daytime and night Sleep (in general) Fair  Pain is worse with: walking, bending, sitting, inactivity, standing and some activites Pain improves with: rest, heat/ice, therapy/exercise, pacing activities, medication, TENS and injections Relief from Meds: 5  Mobility walk without assistance how many minutes can you walk? 10-15 ability to climb steps?  yes do you drive?  yes  Function employed # of hrs/week 10 disabled: date disabled 2010 I need assistance with the following:  meal prep, household duties and shopping  Neuro/Psych bladder control problems weakness numbness tremor spasms depression  Prior Studies Any changes since last visit?  no  Physicians involved in your care Any changes since last visit?  no   Family History  Problem Relation Age of Onset  . Breast cancer Mother   . Diabetes Mother   . Heart disease Mother   . Diabetes Father   . Heart disease Father   . Stomach cancer Maternal Uncle   . Breast cancer Paternal Aunt   . Colon cancer Neg Hx   . Emphysema Father   . Allergies Mother   . Allergies Father    Social History   Social History  . Marital status: Married    Spouse name: N/A  . Number of children: N/A  . Years of education: N/A   Occupational History  . Bookkeeper    Social History Main Topics  . Smoking  status: Never Smoker  . Smokeless tobacco: Never Used  . Alcohol use No  . Drug use: No  . Sexual activity: Not Asked   Other Topics Concern  . None   Social History Narrative  . None   Past Surgical History:  Procedure Laterality Date  . [place] STUDY N/A 01/04/2016   Procedure: [place] STUDY;  Surgeon: Manus Gunning, MD;  Location: [facility];  Service: Gastroenterology;  Laterality: N/A;  . ABDOMINAL HYSTERECTOMY  1988  . ANTERIOR CERVICAL DECOMP/DISCECTOMY FUSION  2009, 1993   first procedure in ~ 1993/Dr Botero  . APPENDECTOMY  1988  . CHOLECYSTECTOMY  2005  . ESOPHAGEAL MANOMETRY N/A 01/04/2016   Procedure: ESOPHAGEAL MANOMETRY (EM) 24 HR PH;  Surgeon: Manus Gunning, MD;  Location: [facility];  Service: Gastroenterology;  Laterality: N/A;  . NASAL SINUS SURGERY  2006  . TONSILLECTOMY AND ADENOIDECTOMY    . TUBAL LIGATION  1979   Past Medical History:  Diagnosis Date  . B12 deficiency   . Depression   . Fibromyalgia   . Generalized hyperhidrosis 04/17/2014  . GERD (gastroesophageal reflux disease)   . Hyperlipemia   . HYPOTHYROIDISM 05/29/2007  . Irritable bowel syndrome   . Migraines   . Type II or unspecified type diabetes mellitus with neurological manifestations, not stated as uncontrolled 04/07/2013  . Unspecified essential hypertension  BP 135/73 (BP Location: Left Arm, Patient Position: Sitting, Cuff Size: Normal)   Pulse 68   Resp 16   SpO2 96%   Opioid Risk Score:   Fall Risk Score:  `1  Depression screen PHQ 2/9  Depression screen [facility] 2/9 02/03/2016 10/23/2015 10/05/2015 02/04/2015 09/11/2014 10/15/2013  Decreased Interest 0 2 2 0 2 0  Down, Depressed, Hopeless 0 0 0 0 0 0  PHQ - 2 Score 0 2 2 0 2 0  Altered sleeping - - 3 - 0 -  Tired, decreased energy - - 1 - 2 -  Change in appetite - - 0 - 0 -  Feeling bad or failure about yourself  - - 0 - 0 -  Trouble concentrating - - 1 - 1 -  Moving slowly or fidgety/restless - - 0  - 0 -  Suicidal thoughts - - 0 - 0 -  PHQ-9 Score - - 7 - 5 -  Difficult doing work/chores - - Somewhat difficult - - -  Some recent data might be hidden    Review of Systems  Cardiovascular: Positive for leg swelling.  Gastrointestinal: Positive for constipation.  All other systems reviewed and are negative.      Objective:   Physical Exam  Constitutional: She is oriented to person, place, and time. She appears well-developed and well-nourished.  HENT:  Head: Normocephalic and atraumatic.  Neck: Normal range of motion. Neck supple.  Cardiovascular: Normal rate and regular rhythm.   Pulmonary/Chest: Effort normal and breath sounds normal.  Musculoskeletal:  Normal Muscle Bulk and Muscle Testing Reveals: Upper Extremities: Full ROM and Muscle Strength 5/5 Thoracic Paraspinal Tenderness: T-7-T-9 Lower Extremities: Full ROM and Muscle Strength 5/5 Arises from table with ease Narrow based Gait  Neurological: She is alert and oriented to person, place, and time.  Skin: Skin is warm and dry.  Psychiatric: She has a normal mood and affect.  Nursing note and vitals reviewed.         Assessment & Plan:  1. Cervical postlaminectomy syndrome: with neck and shoulder pain. Continue exercise routine and using heat therapy. Continue MS Contin 15 mg one tablet three times a day #90.  We will continue the opioid monitoring program, this consists of regular clinic visits, examinations, urine drug screen, pill counts as well as use of [place] Controlled Substance reporting System. 2. Fibromyalgia: Continue with Gabapentin, activity and exercise regime. 3. Right Shoulder Pain: Continue HEP and alternate heat and ice therapy 4. Left Knee Pain/ Mild Degenerative Changes: No complaints today.Continue Voltaren Gel.  15 minutes of face to face patient care time was spent during this visit. All questions were encouraged and answered.   F/U in 1 month

## 2016-02-04 DIAGNOSIS — E119 Type 2 diabetes mellitus without complications: Secondary | ICD-10-CM | POA: Diagnosis not present

## 2016-02-04 DIAGNOSIS — H2513 Age-related nuclear cataract, bilateral: Secondary | ICD-10-CM | POA: Diagnosis not present

## 2016-02-17 ENCOUNTER — Telehealth: Payer: Self-pay | Admitting: Family Medicine

## 2016-02-17 NOTE — Telephone Encounter (Signed)
Called pt - needs to have awv before 09-30  Left message

## 2016-02-22 DIAGNOSIS — J9610 Chronic respiratory failure, unspecified whether with hypoxia or hypercapnia: Secondary | ICD-10-CM | POA: Diagnosis not present

## 2016-02-24 ENCOUNTER — Ambulatory Visit: Payer: Medicare Other

## 2016-03-02 ENCOUNTER — Encounter: Payer: Self-pay | Admitting: Registered Nurse

## 2016-03-02 ENCOUNTER — Encounter: Payer: Medicare Other | Attending: Physical Medicine & Rehabilitation | Admitting: Registered Nurse

## 2016-03-02 VITALS — BP 123/67 | HR 62 | Resp 14

## 2016-03-02 DIAGNOSIS — M609 Myositis, unspecified: Secondary | ICD-10-CM

## 2016-03-02 DIAGNOSIS — Z79899 Other long term (current) drug therapy: Secondary | ICD-10-CM

## 2016-03-02 DIAGNOSIS — M545 Low back pain, unspecified: Secondary | ICD-10-CM

## 2016-03-02 DIAGNOSIS — Z5181 Encounter for therapeutic drug level monitoring: Secondary | ICD-10-CM | POA: Diagnosis not present

## 2016-03-02 DIAGNOSIS — G894 Chronic pain syndrome: Secondary | ICD-10-CM

## 2016-03-02 DIAGNOSIS — M791 Myalgia: Secondary | ICD-10-CM

## 2016-03-02 DIAGNOSIS — M797 Fibromyalgia: Secondary | ICD-10-CM

## 2016-03-02 DIAGNOSIS — M961 Postlaminectomy syndrome, not elsewhere classified: Secondary | ICD-10-CM

## 2016-03-02 DIAGNOSIS — M25562 Pain in left knee: Secondary | ICD-10-CM

## 2016-03-02 DIAGNOSIS — IMO0001 Reserved for inherently not codable concepts without codable children: Secondary | ICD-10-CM

## 2016-03-02 DIAGNOSIS — M25561 Pain in right knee: Secondary | ICD-10-CM

## 2016-03-02 MED ORDER — MORPHINE SULFATE ER 15 MG PO TBCR: 15.0000 mg | EXTENDED_RELEASE_TABLET | Freq: Three times a day (TID) | ORAL | 0 refills | Status: DC

## 2016-03-02 NOTE — Progress Notes (Signed)
Subjective:    Patient ID: [name], female    DOB: 24-Jan-1951, 65 y.o.   MRN: [id number]  HPI: [name] is a 65 year old female who returns for follow up for chronic pain and medication refill. She states her pain is located in her mid back, lower back and bilateral knee pain. She rates her pain 7. Her current exercise regime is walking and performing stretching exercises.  Pain Inventory Average Pain 7 Pain Right Now 7 My pain is constant, burning and aching  In the last 24 hours, has pain interfered with the following? General activity 8 Relation with others 8 Enjoyment of life 8 What TIME of day is your pain at its worst? evening Sleep (in general) Poor  Pain is worse with: walking, bending, sitting, standing and some activites Pain improves with: rest, heat/ice, therapy/exercise, pacing activities, medication, TENS and injections Relief from Meds: 4  Mobility walk without assistance how many minutes can you walk? 10 ability to climb steps?  yes do you drive?  yes Do you have any goals in this area?  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  yes  Neuro/Psych bladder control problems weakness numbness tremor depression  Prior Studies Any changes since last visit?  no  Physicians involved in your care Any changes since last visit?  no   Family History  Problem Relation Age of Onset  . Breast cancer Mother   . Diabetes Mother   . Heart disease Mother   . Diabetes Father   . Heart disease Father   . Stomach cancer Maternal Uncle   . Breast cancer Paternal Aunt   . Colon cancer Neg Hx   . Emphysema Father   . Allergies Mother   . Allergies Father    Social History   Social History  . Marital status: Married    Spouse name: N/A  . Number of children: N/A  .  Years of education: N/A   Occupational History  . Bookkeeper    Social History Main Topics  . Smoking status: Never Smoker  . Smokeless tobacco: Never Used  . Alcohol use No  . Drug use: No  . Sexual activity: Not Asked   Other Topics Concern  . None   Social History Narrative  . None   Past Surgical History:  Procedure Laterality Date  . [place] STUDY N/A 01/04/2016   Procedure: [place] STUDY;  Surgeon: Manus Gunning, MD;  Location: [facility];  Service: Gastroenterology;  Laterality: N/A;  . ABDOMINAL HYSTERECTOMY  1988  . ANTERIOR CERVICAL DECOMP/DISCECTOMY FUSION  2009, 1993   first procedure in ~ 1993/Dr Botero  . APPENDECTOMY  1988  . CHOLECYSTECTOMY  2005  . ESOPHAGEAL MANOMETRY N/A 01/04/2016   Procedure: ESOPHAGEAL MANOMETRY (EM) 24 HR PH;  Surgeon: Manus Gunning, MD;  Location: [facility];  Service: Gastroenterology;  Laterality: N/A;  . NASAL SINUS SURGERY  2006  . TONSILLECTOMY AND ADENOIDECTOMY    . TUBAL LIGATION  1979   Past Medical History:  Diagnosis Date  . B12 deficiency   . Depression   . Fibromyalgia   . Generalized hyperhidrosis 04/17/2014  . GERD (gastroesophageal reflux disease)   . Hyperlipemia   . HYPOTHYROIDISM 05/29/2007  . Irritable bowel syndrome   .  Migraines   . Type II or unspecified type diabetes mellitus with neurological manifestations, not stated as uncontrolled 04/07/2013  . Unspecified essential hypertension    BP 123/67 (BP Location: Right Arm, Patient Position: Sitting, Cuff Size: Large)   Pulse 62   Resp 14   SpO2 97%   Opioid Risk Score:   Fall Risk Score:  `1  Depression screen PHQ 2/9  Depression screen [facility] 2/9 02/03/2016 10/23/2015 10/05/2015 02/04/2015 09/11/2014 10/15/2013  Decreased Interest 0 2 2 0 2 0  Down, Depressed, Hopeless 0 0 0 0 0 0  PHQ - 2 Score 0 2 2 0 2 0  Altered sleeping - - 3 - 0 -  Tired, decreased energy - - 1 - 2 -  Change in appetite - - 0 - 0 -  Feeling bad or  failure about yourself  - - 0 - 0 -  Trouble concentrating - - 1 - 1 -  Moving slowly or fidgety/restless - - 0 - 0 -  Suicidal thoughts - - 0 - 0 -  PHQ-9 Score - - 7 - 5 -  Difficult doing work/chores - - Somewhat difficult - - -  Some recent data might be hidden    Review of Systems  HENT: Negative.   Eyes: Negative.   Respiratory: Negative.   Cardiovascular: Positive for leg swelling.  Gastrointestinal: Positive for constipation.  Endocrine: Negative.   Genitourinary: Positive for difficulty urinating.  Musculoskeletal: Positive for back pain.  Skin: Negative.   Neurological: Positive for tremors, weakness and numbness.  Psychiatric/Behavioral: Positive for dysphoric mood.  All other systems reviewed and are negative.      Objective:   Physical Exam  Constitutional: She is oriented to person, place, and time. She appears well-developed and well-nourished.  HENT:  Head: Normocephalic and atraumatic.  Neck: Normal range of motion. Neck supple.  Cardiovascular: Normal rate and regular rhythm.   Pulmonary/Chest: Effort normal and breath sounds normal.  Musculoskeletal:  Normal Muscle Bulk and Muscle Testing Reveals: Upper Extremities: Full ROM and Muscle Strength 5/5 Thoracic Paraspinal Tenderness: T-7- T-9 Lumbar Paraspinal Tenderness: L-3- L-5 Lower Extremities: Full ROM and Muscle Strength 5/5 Right Lower Extremity Flexion Produces pain into Lumbar Arises from Table with ease Narrow Based Gait  Neurological: She is alert and oriented to person, place, and time.  Skin: Skin is warm and dry.  Psychiatric: She has a normal mood and affect.  Nursing note and vitals reviewed.         Assessment & Plan:  1. Cervical postlaminectomy syndrome: with neck and shoulder pain. Continue exercise routine and using heat therapy. Continue MS Contin 15 mg one tablet three times a day #90.  We will continue the opioid monitoring program, this consists of regular clinic visits,  examinations, urine drug screen, pill counts as well as use of [place] Controlled Substance reporting System. 2. Fibromyalgia: Continue with Gabapentin, activity and exercise regime. 3. Right Shoulder Pain: No complaints today. Continue HEP and alternate heat and ice therapy 4. Bilateral Knee Pain/ Mild Degenerative Changes: No complaints today.Continue Voltaren Gel.  15 minutes of face to face patient care time was spent during this visit. All questions were encouraged and answered.   F/U in 1 month

## 2016-03-04 ENCOUNTER — Other Ambulatory Visit: Payer: Self-pay | Admitting: Physical Medicine & Rehabilitation

## 2016-03-09 ENCOUNTER — Encounter: Payer: Self-pay | Admitting: Family Medicine

## 2016-03-11 LAB — 6-ACETYLMORPHINE,TOXASSURE ADD
6-ACETYLMORPHINE: NEGATIVE
6-acetylmorphine: NOT DETECTED ng/mg creat

## 2016-03-11 LAB — TOXASSURE SELECT,+ANTIDEPR,UR

## 2016-03-16 NOTE — Progress Notes (Signed)
Urine drug screen for this encounter is consistent for prescribed medications.   

## 2016-04-01 ENCOUNTER — Encounter: Payer: Self-pay | Admitting: Registered Nurse

## 2016-04-01 ENCOUNTER — Encounter: Payer: Medicare Other | Attending: Physical Medicine & Rehabilitation | Admitting: Registered Nurse

## 2016-04-01 VITALS — BP 109/64 | HR 65

## 2016-04-01 DIAGNOSIS — Z79899 Other long term (current) drug therapy: Secondary | ICD-10-CM | POA: Insufficient documentation

## 2016-04-01 DIAGNOSIS — M545 Low back pain, unspecified: Secondary | ICD-10-CM

## 2016-04-01 DIAGNOSIS — G894 Chronic pain syndrome: Secondary | ICD-10-CM | POA: Insufficient documentation

## 2016-04-01 DIAGNOSIS — Z5181 Encounter for therapeutic drug level monitoring: Secondary | ICD-10-CM | POA: Diagnosis not present

## 2016-04-01 DIAGNOSIS — M940 Chondrocostal junction syndrome [Tietze]: Secondary | ICD-10-CM

## 2016-04-01 DIAGNOSIS — M797 Fibromyalgia: Secondary | ICD-10-CM | POA: Diagnosis not present

## 2016-04-01 MED ORDER — METHYLPREDNISOLONE 4 MG PO TBPK: ORAL_TABLET | ORAL | 0 refills | Status: DC

## 2016-04-01 MED ORDER — MORPHINE SULFATE ER 15 MG PO TBCR: 15.0000 mg | EXTENDED_RELEASE_TABLET | Freq: Three times a day (TID) | ORAL | 0 refills | Status: DC

## 2016-04-01 NOTE — Progress Notes (Signed)
Subjective:    Patient ID: [name], female    DOB: [birth year], 65 y.o.   MRN: [id number]  HPI: [name] is a 65 year old female who returns for follow up for chronic pain and medication refill. She states her pain is located in her mid back. Also states she has been having mid-sternal tenderness for the last few months. She has followed up with her PCP and gastroenterologist. She had esophageal manometry on 01/04/16, also states she had a  EKG. She hasn't followed up with her cardiologist, she was encouraged to obtain an appointment. She refuses ED evaluation at this time, states she will call her cardiologist. She was instructed by her gastroenterologist not to take NSAID'S she states, will prescribe a medrol dose with the possibility of a diagnosis of Costochondritis, she verbalizes understanding.  She rates her pain 8. Her current exercise regime is walking.   Pain Inventory Average Pain 6 Pain Right Now 8 My pain is constant, burning and aching  In the last 24 hours, has pain interfered with the following? General activity 8 Relation with others 8 Enjoyment of life 8 What TIME of day is your pain at its worst? evening and night Sleep (in general) Poor  Pain is worse with: walking, bending, sitting, inactivity, standing and some activites Pain improves with: rest, heat/ice, therapy/exercise, pacing activities, medication, TENS and injections Relief from Meds: 4  Mobility walk without assistance how many minutes can you walk? 10-15 ability to climb steps?  yes do you drive?  yes Do you have any goals in this area?  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  yes  Neuro/Psych bladder control problems weakness numbness tremor tingling spasms depression  Prior Studies Any changes  since last visit?  no  Physicians involved in your care Any changes since last visit?  no   Family History  Problem Relation Age of Onset  . Breast cancer Mother   . Diabetes Mother   . Heart disease Mother   . Allergies Mother   . Diabetes Father   . Heart disease Father   . Emphysema Father   . Allergies Father   . Stomach cancer Maternal Uncle   . Breast cancer Paternal Aunt   . Colon cancer Neg Hx    Social History   Social History  . Marital status: Married    Spouse name: N/A  . Number of children: N/A  . Years of education: N/A   Occupational History  . Bookkeeper    Social History Main Topics  . Smoking status: Never Smoker  . Smokeless tobacco: Never Used  . Alcohol use No  . Drug use: No  . Sexual activity: Not Asked   Other Topics Concern  . None   Social History Narrative  . None   Past Surgical History:  Procedure Laterality Date  . [place] STUDY N/A 01/04/2016   Procedure: [place] STUDY;  Surgeon: Manus Gunning, MD;  Location: [facility];  Service: Gastroenterology;  Laterality: N/A;  . ABDOMINAL HYSTERECTOMY  1988  . ANTERIOR CERVICAL DECOMP/DISCECTOMY FUSION  2009, 1993   first procedure in ~ 1993/Dr Botero  . APPENDECTOMY  1988  . CHOLECYSTECTOMY  2005  . ESOPHAGEAL MANOMETRY N/A 01/04/2016   Procedure: ESOPHAGEAL MANOMETRY (EM) 24  HR PH;  Surgeon: Manus Gunning, MD;  Location: [facility];  Service: Gastroenterology;  Laterality: N/A;  . NASAL SINUS SURGERY  2006  . TONSILLECTOMY AND ADENOIDECTOMY    . TUBAL LIGATION  1979   Past Medical History:  Diagnosis Date  . B12 deficiency   . Depression   . Fibromyalgia   . Generalized hyperhidrosis 04/17/2014  . GERD (gastroesophageal reflux disease)   . Hyperlipemia   . HYPOTHYROIDISM 05/29/2007  . Irritable bowel syndrome   . Migraines   . Type II or unspecified type diabetes mellitus with neurological manifestations, not stated as uncontrolled(250.60)  04/07/2013  . Unspecified essential hypertension    BP 109/64   Pulse 65   SpO2 96%   Opioid Risk Score:   Fall Risk Score:  `1  Depression screen PHQ 2/9  Depression screen [facility] 2/9 02/03/2016 10/23/2015 10/05/2015 02/04/2015 09/11/2014 10/15/2013  Decreased Interest 0 2 2 0 2 0  Down, Depressed, Hopeless 0 0 0 0 0 0  PHQ - 2 Score 0 2 2 0 2 0  Altered sleeping - - 3 - 0 -  Tired, decreased energy - - 1 - 2 -  Change in appetite - - 0 - 0 -  Feeling bad or failure about yourself  - - 0 - 0 -  Trouble concentrating - - 1 - 1 -  Moving slowly or fidgety/restless - - 0 - 0 -  Suicidal thoughts - - 0 - 0 -  PHQ-9 Score - - 7 - 5 -  Difficult doing work/chores - - Somewhat difficult - - -  Some recent data might be hidden     Review of Systems  HENT: Negative.   Eyes: Negative.   Respiratory: Negative.   Cardiovascular: Negative.   Gastrointestinal: Positive for constipation.  Endocrine: Negative.   Genitourinary: Negative.   Musculoskeletal: Positive for back pain and neck pain.  Skin: Negative.   Allergic/Immunologic: Negative.   Neurological: Positive for tremors, weakness and numbness.  Hematological: Negative.   Psychiatric/Behavioral: Positive for dysphoric mood.       Objective:   Physical Exam  Constitutional: She is oriented to person, place, and time. She appears well-developed and well-nourished.  HENT:  Head: Normocephalic and atraumatic.  Neck: Normal range of motion. Neck supple.  Cardiovascular: Normal rate and regular rhythm.   Pulmonary/Chest: Effort normal and breath sounds normal.  Musculoskeletal:  Normal Muscle Bulk and Muscle Testing Reveals: Upper Extremities: Full ROM and Muscle Strength 5/5 Thoracic Paraspinal Tenderness: T-7-T-9 Lower Extremities: Full ROM and Muscle Strength 5/5 Arises from Table with ease Narrow Based Gait  Neurological: She is alert and oriented to person, place, and time.  Skin: Skin is warm and dry.  Psychiatric: She  has a normal mood and affect.  Nursing note and vitals reviewed.         Assessment & Plan:  1. Cervical postlaminectomy syndrome: with neck and shoulder pain. Continue exercise routine and using heat therapy. Continue MS Contin 15 mg one tablet three times a day #90.  We will continue the opioid monitoring program, this consists of regular clinic visits, examinations, urine drug screen, pill counts as well as use of [place] Controlled Substance reporting System. 2. Fibromyalgia: Continue with Gabapentin, activity and exercise regime. 3. Right Shoulder Pain: No complaints today. Continue HEP and alternate heat and ice therapy 4. Bilateral Knee Pain/ Mild Degenerative Changes: No complaints today.Continue Voltaren Gel. 5. Costochondritis: RX: Medrol Dose Pak 6. Mid-sternal Tenderness: Refuses  ED evaluation. States she will follow up with her cardiologist  25 minutes of face to face patient care time was spent during this visit. All questions were encouraged and answered.   F/U in 1 month

## 2016-04-02 ENCOUNTER — Encounter: Payer: Self-pay | Admitting: Registered Nurse

## 2016-04-06 ENCOUNTER — Other Ambulatory Visit: Payer: Self-pay | Admitting: Family Medicine

## 2016-04-14 ENCOUNTER — Telehealth: Payer: Self-pay | Admitting: Registered Nurse

## 2016-04-14 ENCOUNTER — Encounter: Payer: Self-pay | Admitting: Registered Nurse

## 2016-04-14 ENCOUNTER — Encounter: Payer: Self-pay | Admitting: Gastroenterology

## 2016-04-14 NOTE — Telephone Encounter (Signed)
Placed a call to Ms. Cardona regarding her advice request via [place].   She was instructed to call her PCP regarding referral to cardiology as it relates to her chest pain. At this time she states the pain remains mid-sternal without SOB or distress. Instructed to call EMS with chest pain complaints. Also states she has a F/U appointment with her PCP Dr. Edilia Bo on Monday 04/18/16. She verbalizes understanding.

## 2016-04-18 ENCOUNTER — Encounter: Payer: Self-pay | Admitting: Family Medicine

## 2016-04-18 ENCOUNTER — Ambulatory Visit (INDEPENDENT_AMBULATORY_CARE_PROVIDER_SITE_OTHER): Payer: Medicare Other | Admitting: Family Medicine

## 2016-04-18 VITALS — BP 120/60 | HR 70 | Temp 99.0°F | Ht 65.25 in | Wt 200.8 lb

## 2016-04-18 DIAGNOSIS — E538 Deficiency of other specified B group vitamins: Secondary | ICD-10-CM | POA: Diagnosis not present

## 2016-04-18 DIAGNOSIS — Z79899 Other long term (current) drug therapy: Secondary | ICD-10-CM

## 2016-04-18 DIAGNOSIS — E1140 Type 2 diabetes mellitus with diabetic neuropathy, unspecified: Secondary | ICD-10-CM | POA: Diagnosis not present

## 2016-04-18 DIAGNOSIS — R0789 Other chest pain: Secondary | ICD-10-CM

## 2016-04-18 DIAGNOSIS — K219 Gastro-esophageal reflux disease without esophagitis: Secondary | ICD-10-CM

## 2016-04-18 DIAGNOSIS — E038 Other specified hypothyroidism: Secondary | ICD-10-CM

## 2016-04-18 DIAGNOSIS — E559 Vitamin D deficiency, unspecified: Secondary | ICD-10-CM

## 2016-04-18 NOTE — Progress Notes (Signed)
Dr. Frederico Hamman T. Jeweldean Drohan, MD, [facility] [place], [address] Phone: [phone] Fax: [phone]  04/18/2016  Patient: [name], MRN: [id number], DOB: [birth year], 65 y.o.  Primary Physician:  Owens Loffler, MD   Chief Complaint  Patient presents with  . Annual Exam   Subjective:   [name] is a 65 y.o. very pleasant female patient who presents with the following:  F/U DM and other labs.   Chest pain, has had big GI work-up that has been negative.  Does not bother her with movement.  Tried dexilant and it did not really help Central chest pain -  And at the same time, have a lot of gas and burping  Has been to [facility] heart and vascular in the past. I cannot seem to find these in the electronic medical record. Dr. Ellyn Hack.  She has been having ongoing chest pain now for months. No answers been found. She recently was also treated with a round of prednisone by her pain management physicians, which didn't help at all either.  Lipoma on R hip  Diabetes Mellitus: Tolerating Medications: yes Compliance with diet: fairly good Exercise: minimal / intermittent Avg blood sugars at home: not checking Foot problems: none Hypoglycemia: none No nausea, vomitting, blurred vision, polyuria.  Lab Results  Component Value Date   HGBA1C 6.3 04/18/2016   HGBA1C 6.0 10/20/2014   HGBA1C 6.2 01/13/2014   Lab Results  Component Value Date   MICROALBUR 0.7 04/18/2016   LDLCALC 90 01/13/2014   CREATININE 0.75 04/18/2016    Wt Readings from Last 3 Encounters:  04/18/16 200 lb 12 oz (91.1 kg)  12/04/15 217 lb (98.4 kg)  11/17/15 220 lb 9.6 oz (100.1 kg)    Body mass index is 33.15 kg/m.   Thyroid: No symptoms. Labs reviewed. Denies cold / heat intolerance, dry skin, hair loss. No goiter.  Lab Results  Component Value Date   TSH 1.72 04/18/2016     Past Medical History, Surgical History, Social  History, Family History, Problem List, Medications, and Allergies have been reviewed and updated if relevant.  Patient Active Problem List   Diagnosis Date Noted  . Type 2 diabetes mellitus with diabetic neuropathy ([place]) 04/07/2013    Priority: High  . Postlaminectomy syndrome, cervical region 08/29/2011    Priority: High  . FIBROMYALGIA 01/11/2007    Priority: High  . Depression, major, recurrent, in partial remission ([place]) 09/22/2008    Priority: Medium  . Hypothyroidism 05/29/2007    Priority: Medium  . HYPERLIPIDEMIA 05/29/2007    Priority: Medium  . Essential hypertension 11/13/2006    Priority: Medium  . Chronic respiratory failure with hypoxia ([place]) 04/07/2015  . Obesity hypoventilation syndrome ([place]) 03/26/2015  . Sleep related hypoxia 03/09/2015  . Generalized hyperhidrosis 04/17/2014  . Obesity (BMI 30-39.9) 08/23/2013  . 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 FATIGUE 05/04/2009  . TRANSAMINASES, SERUM, ELEVATED 12/24/2008  . ALLERGIC RHINITIS 09/22/2008  . DEFICIENCY, VITAMIN D NOS 01/11/2007  . Other B-complex deficiencies 11/13/2006  . GERD 11/13/2006    Past Medical History:  Diagnosis Date  . B12 deficiency   . Depression   . Fibromyalgia   . Generalized hyperhidrosis 04/17/2014  . GERD (gastroesophageal reflux disease)   . Hyperlipemia   . HYPOTHYROIDISM 05/29/2007  . Irritable bowel syndrome   . Migraines   . Type II or unspecified type diabetes mellitus with neurological manifestations, not  stated as uncontrolled(250.60) 04/07/2013  . Unspecified essential hypertension     Past Surgical History:  Procedure Laterality Date  . [place] STUDY N/A 01/04/2016   Procedure: [place] STUDY;  Surgeon: Manus Gunning, MD;  Location: [facility]COPY;  Service: Gastroenterology;  Laterality: N/A;  . ABDOMINAL HYSTERECTOMY  1988  . ANTERIOR CERVICAL DECOMP/DISCECTOMY FUSION  2009, 1993   first procedure in ~ 1993/Dr  Botero  . APPENDECTOMY  1988  . CHOLECYSTECTOMY  2005  . ESOPHAGEAL MANOMETRY N/A 01/04/2016   Procedure: ESOPHAGEAL MANOMETRY (EM) 24 HR PH;  Surgeon: Manus Gunning, MD;  Location: [facility];  Service: Gastroenterology;  Laterality: N/A;  . NASAL SINUS SURGERY  2006  . TONSILLECTOMY AND ADENOIDECTOMY    . TUBAL LIGATION  1979    Social History   Social History  . Marital status: Married    Spouse name: N/A  . Number of children: N/A  . Years of education: N/A   Occupational History  . Bookkeeper    Social History Main Topics  . Smoking status: Never Smoker  . Smokeless tobacco: Never Used  . Alcohol use No  . Drug use: No  . Sexual activity: Not on file   Other Topics Concern  . Not on file   Social History Narrative  . No narrative on file    Family History  Problem Relation Age of Onset  . Breast cancer Mother   . Diabetes Mother   . Heart disease Mother   . Allergies Mother   . Diabetes Father   . Heart disease Father   . Emphysema Father   . Allergies Father   . Stomach cancer Maternal Uncle   . Breast cancer Paternal Aunt   . Colon cancer Neg Hx     Allergies  Allergen Reactions  . Rizatriptan Benzoate Anaphylaxis    Throat and Tongue Swelling Closed  . Sumatriptan Other (See Comments)    Throat closed & Tongue Swelling  . Tizanidine Shortness Of Breath  . Aleve [Naproxen Sodium] Hives  . Clarithromycin   . Cyclobenzaprine Hcl     REACTION: THROAT CLOSED,TOUNGE SWELLING  . Doxycycline   . Duloxetine   . Keflex [Cephalexin] Itching    Sores  . Metronidazole   . Oxycodone Hcl Diarrhea    Vomiting  . Penicillins     TOLERATES CEPHALOSPORINS  . Rosuvastatin     REACTION: MUSCLE ACHES  . Septra [Sulfamethoxazole-Trimethoprim] Other (See Comments)    Rash and itching  . Tramadol   . Venlafaxine Hives  . Voltaren [Diclofenac Sodium] Hives    oral    Medication list reviewed and updated in full in [place].   GEN: No  acute illnesses, no fevers, chills. GI: No n/v/d, eating normally Pulm: No SOB Interactive and getting along well at home.  Otherwise, ROS is as per the HPI.  Objective:   BP 120/60   Pulse 70   Temp 99 F (37.2 C) (Oral)   Ht 5' 5.25" (1.657 m)   Wt 200 lb 12 oz (91.1 kg)   BMI 33.15 kg/m   GEN: WDWN, NAD, Non-toxic, A & O x 3 HEENT: Atraumatic, Normocephalic. Neck supple. No masses, No LAD. Ears and Nose: No external deformity. CV: RRR, No M/G/R. No JVD. No thrill. No extra heart sounds. PULM: CTA B, no wheezes, crackles, rhonchi. No retractions. No resp. distress. No accessory muscle use. EXTR: No c/c/e NEURO Normal gait.  PSYCH: Normally interactive. Conversant.  Not depressed or anxious appearing.  Calm demeanor.   Chest wall and sternum palpated directly, and no significant pain was elicited on examination of sternum, ribs, and when compression of all.  Laboratory and Imaging Data: Results for orders placed or performed in visit on 77/41/28  Basic metabolic panel  Result Value Ref Range   Sodium 141 135 - 145 mEq/L   Potassium 4.2 3.5 - 5.1 mEq/L   Chloride 102 96 - 112 mEq/L   CO2 34 (H) 19 - 32 mEq/L   Glucose, Bld 115 (H) 70 - 99 mg/dL   BUN 12 6 - 23 mg/dL   Creatinine, Ser 0.75 0.40 - 1.20 mg/dL   Calcium 9.7 8.4 - 10.5 mg/dL   GFR 82.57 >60.00 mL/min  CBC with Differential/Platelet  Result Value Ref Range   WBC 7.1 4.0 - 10.5 K/uL   RBC 4.16 3.87 - 5.11 Mil/uL   Hemoglobin 12.6 12.0 - 15.0 g/dL   HCT 37.3 36.0 - 46.0 %   MCV 89.6 78.0 - 100.0 fl   MCHC 33.9 30.0 - 36.0 g/dL   RDW 12.9 11.5 - 15.5 %   Platelets 258.0 150.0 - 400.0 K/uL   Neutrophils Relative % 63.1 43.0 - 77.0 %   Lymphocytes Relative 27.3 12.0 - 46.0 %   Monocytes Relative 5.6 3.0 - 12.0 %   Eosinophils Relative 3.4 0.0 - 5.0 %   Basophils Relative 0.6 0.0 - 3.0 %   Neutro Abs 4.5 1.4 - 7.7 K/uL   Lymphs Abs 1.9 0.7 - 4.0 K/uL   Monocytes Absolute 0.4 0.1 - 1.0 K/uL   Eosinophils  Absolute 0.2 0.0 - 0.7 K/uL   Basophils Absolute 0.0 0.0 - 0.1 K/uL  Hepatic function panel  Result Value Ref Range   Total Bilirubin 0.3 0.2 - 1.2 mg/dL   Bilirubin, Direct 0.1 0.0 - 0.3 mg/dL   Alkaline Phosphatase 98 39 - 117 U/L   AST 33 0 - 37 U/L   ALT 36 (H) 0 - 35 U/L   Total Protein 6.8 6.0 - 8.3 g/dL   Albumin 4.1 3.5 - 5.2 g/dL  Vitamin B12  Result Value Ref Range   Vitamin B-12 591 211 - 911 pg/mL  VITAMIN D 25 Hydroxy (Vit-D Deficiency, Fractures)  Result Value Ref Range   VITD 28.48 (L) 30.00 - 100.00 ng/mL  Microalbumin / creatinine urine ratio  Result Value Ref Range   Microalb, Ur 0.7 0.0 - 1.9 mg/dL   Creatinine,U 98.8 mg/dL   Microalb Creat Ratio 0.7 0.0 - 30.0 mg/g  Hemoglobin A1c  Result Value Ref Range   Hgb A1c MFr Bld 6.3 4.6 - 6.5 %  TSH  Result Value Ref Range   TSH 1.72 0.35 - 4.50 uIU/mL  T3, free  Result Value Ref Range   T3, Free 3.3 2.3 - 4.2 pg/mL  T4, free  Result Value Ref Range   Free T4 0.77 0.60 - 1.60 ng/dL     Assessment and Plan:   Type 2 diabetes mellitus with diabetic neuropathy, without long-term current use of insulin (HCC) - Plan: Basic metabolic panel, Microalbumin / creatinine urine ratio, Hemoglobin A1c  Other specified hypothyroidism - Plan: TSH, T3, free, T4, free  Vitamin D deficiency - Plan: VITAMIN D 25 Hydroxy (Vit-D Deficiency, Fractures)  Gastroesophageal reflux disease without esophagitis  Vitamin B 12 deficiency - Plan: Vitamin B12  Encounter for long-term current use of medication - Plan: CBC with Differential/Platelet, Hepatic function panel  Other chest  pain - Plan: Ambulatory referral to Cardiology  Chest pain of unclear origin.  Extensive GI workup which is been negative thus far.  Clinically, this patient does not have costochondritis or any form of muscular skeletal chest pain.  Etiology unclear.  It is reasonable in a diabetic patient with a family history of cardiac disease at age 7 and ongoing  intermittent chest pain to have a cardiology evaluation.  I appreciate their help.  Thyroid stable.  B12 stable.  Mildly low on vitamin D, we will have her be sure she is taking oral vitamin D supplementation.  Follow-up: 6 mo  Orders Placed This Encounter  Procedures  . Basic metabolic panel  . CBC with Differential/Platelet  . Hepatic function panel  . Vitamin B12  . VITAMIN D 25 Hydroxy (Vit-D Deficiency, Fractures)  . Microalbumin / creatinine urine ratio  . Hemoglobin A1c  . TSH  . T3, free  . T4, free  . Ambulatory referral to Cardiology    Signed,  Frederico Hamman T. Kailan Laws, MD   Patient's Medications  New Prescriptions   No medications on file  Previous Medications   ASPIRIN 81 MG TABLET    Take 81 mg by mouth daily.     B COMPLEX VITAMINS (B COMPLEX 100 PO)    Take 1 tablet by mouth daily.   BLOOD GLUCOSE MONITORING SUPPL (ACCU-CHEK [name] PLUS) W/DEVICE KIT    Check blood sugar once a day and as directed. Dx E11.40   CALCIUM CARBONATE (CALCIUM 600 PO)    Take 600 mg by mouth 2 (two) times daily.    CHOLECALCIFEROL (VITAMIN D) 2000 UNITS TABLET    Take 2,000 Units by mouth daily.     CITALOPRAM (CELEXA) 40 MG TABLET    Take 1 tablet by mouth  daily   CLINDAMYCIN (CLEOCIN) 300 MG CAPSULE    Take 600 mg by mouth. before dental appointments   COENZYME Q-10 100 MG CAPSULE    Take 100 mg by mouth daily.   DEXLANSOPRAZOLE 30 MG CAPSULE    Take 1 capsule (30 mg total) by mouth daily.   DICLOFENAC SODIUM (VOLTAREN) 1 % GEL    Apply 4 g topically 4 (four) times daily.   DOCUSATE SODIUM (COLACE) 100 MG CAPSULE    Take 200 mg by mouth at bedtime.    GABAPENTIN (NEURONTIN) 600 MG TABLET    Take 1 tablet by mouth 3  times daily   GLUCOSE BLOOD (ACCU-CHEK [name] PLUS) TEST STRIP    Check blood sugar once a day and as directed. Dx E11.40   LANCET DEVICES (ACCU-CHEK SOFTCLIX) LANCETS    Check blood sugar once a day and as directed. Dx E11.40, Normal   LEVOTHYROXINE (SYNTHROID,  LEVOTHROID) 50 MCG TABLET    Take 1 tablet by mouth  daily   LORATADINE (CLARITIN) 10 MG TABLET    Take 10 mg by mouth daily.   MAGNESIUM GLUCONATE (MAGONATE) 500 MG TABLET    Take 1,000 mg by mouth at bedtime.    METHOCARBAMOL (ROBAXIN) 500 MG TABLET    TAKE ONE (1) TABLET BY MOUTH EVERY EIGHTHOURS AS NEEDED FOR MUSCLE SPASMS   MIRTAZAPINE (REMERON) 15 MG TABLET    Take 1 tablet by mouth at  bedtime   MORPHINE (MS CONTIN) 15 MG 12 HR TABLET    Take 1 tablet (15 mg total) by mouth 3 (three) times daily.   MUPIROCIN OINTMENT (BACTROBAN) 2 %    PLACE 1 APPLICATION INTO THE NOSE 2  TIMES DAILY FOR 1 WEEK   OXYBUTYNIN (DITROPAN-XL) 10 MG 24 HR TABLET    TAKE ONE TABLET BY MOUTH EVERY NIGHT AT BEDTIME   SENNA-DOCUSATE (PERI-COLACE) 8.6-50 MG PER TABLET    Take 4 tablets by mouth at bedtime as needed for mild constipation.   Modified Medications   No medications on file  Discontinued Medications   BIOTIN 1000 MCG TABLET    Take 1,000 mcg by mouth daily.   CEPHALEXIN (KEFLEX) 500 MG CAPSULE    Take 1 capsule (500 mg total) by mouth 3 (three) times daily.   METHYLPREDNISOLONE (MEDROL DOSEPAK) 4 MG TBPK TABLET    Use a s directed

## 2016-04-18 NOTE — Progress Notes (Signed)
Pre visit review using our clinic review tool, if applicable. No additional management support is needed unless otherwise documented below in the visit note. 

## 2016-04-18 NOTE — Patient Instructions (Signed)
REFERRALS TO SPECIALISTS, SPECIAL TESTS (MRI, CT, ULTRASOUNDS)  [facility] or [facility] will help you. ASK CHECK-IN FOR HELP.  Imaging / Special Testing referrals sometimes can be done same day if EMERGENCY, but others can take 2 or 3 days to get an appointment. Starting in 2015, many of the new Medicare plans and Obamacare plans take much longer.   Specialist appointment times vary a great deal, based on their schedule / openings. -- Some specialists have very long wait times. (Example. Dermatology. Multiple months  for non-cancer)   

## 2016-04-19 LAB — HEPATIC FUNCTION PANEL
ALT: 36 U/L — ABNORMAL HIGH (ref 0–35)
AST: 33 U/L (ref 0–37)
Albumin: 4.1 g/dL (ref 3.5–5.2)
Alkaline Phosphatase: 98 U/L (ref 39–117)
Bilirubin, Direct: 0.1 mg/dL (ref 0.0–0.3)
Total Bilirubin: 0.3 mg/dL (ref 0.2–1.2)
Total Protein: 6.8 g/dL (ref 6.0–8.3)

## 2016-04-19 LAB — CBC WITH DIFFERENTIAL/PLATELET
Basophils Absolute: 0 10*3/uL (ref 0.0–0.1)
Basophils Relative: 0.6 % (ref 0.0–3.0)
Eosinophils Absolute: 0.2 10*3/uL (ref 0.0–0.7)
Eosinophils Relative: 3.4 % (ref 0.0–5.0)
HCT: 37.3 % (ref 36.0–46.0)
Hemoglobin: 12.6 g/dL (ref 12.0–15.0)
Lymphocytes Relative: 27.3 % (ref 12.0–46.0)
Lymphs Abs: 1.9 10*3/uL (ref 0.7–4.0)
MCHC: 33.9 g/dL (ref 30.0–36.0)
MCV: 89.6 fl (ref 78.0–100.0)
Monocytes Absolute: 0.4 10*3/uL (ref 0.1–1.0)
Monocytes Relative: 5.6 % (ref 3.0–12.0)
Neutro Abs: 4.5 10*3/uL (ref 1.4–7.7)
Neutrophils Relative %: 63.1 % (ref 43.0–77.0)
Platelets: 258 10*3/uL (ref 150.0–400.0)
RBC: 4.16 Mil/uL (ref 3.87–5.11)
RDW: 12.9 % (ref 11.5–15.5)
WBC: 7.1 10*3/uL (ref 4.0–10.5)

## 2016-04-19 LAB — BASIC METABOLIC PANEL
BUN: 12 mg/dL (ref 6–23)
CO2: 34 mEq/L — ABNORMAL HIGH (ref 19–32)
Calcium: 9.7 mg/dL (ref 8.4–10.5)
Chloride: 102 mEq/L (ref 96–112)
Creatinine, Ser: 0.75 mg/dL (ref 0.40–1.20)
GFR: 82.57 mL/min (ref >60.00)
Glucose, Bld: 115 mg/dL — ABNORMAL HIGH (ref 70–99)
Potassium: 4.2 mEq/L (ref 3.5–5.1)
Sodium: 141 mEq/L (ref 135–145)

## 2016-04-19 LAB — MICROALBUMIN / CREATININE URINE RATIO
Creatinine,U: 98.8 mg/dL
Microalb Creat Ratio: 0.7 mg/g (ref 0.0–30.0)
Microalb, Ur: 0.7 mg/dL (ref 0.0–1.9)

## 2016-04-19 LAB — TSH: TSH: 1.72 u[IU]/mL (ref 0.35–4.50)

## 2016-04-19 LAB — HEMOGLOBIN A1C: Hgb A1c MFr Bld: 6.3 % (ref 4.6–6.5)

## 2016-04-19 LAB — VITAMIN D 25 HYDROXY (VIT D DEFICIENCY, FRACTURES): VITD: 28.48 ng/mL — ABNORMAL LOW (ref 30.00–100.00)

## 2016-04-19 LAB — VITAMIN B12: Vitamin B-12: 591 pg/mL (ref 211–911)

## 2016-04-19 LAB — T3, FREE: T3, Free: 3.3 pg/mL (ref 2.3–4.2)

## 2016-04-19 LAB — T4, FREE: Free T4: 0.77 ng/dL (ref 0.60–1.60)

## 2016-04-20 ENCOUNTER — Ambulatory Visit (INDEPENDENT_AMBULATORY_CARE_PROVIDER_SITE_OTHER): Payer: Medicare Other | Admitting: Cardiology

## 2016-04-20 ENCOUNTER — Encounter: Payer: Self-pay | Admitting: Cardiology

## 2016-04-20 VITALS — BP 144/78 | HR 64 | Ht 66.0 in | Wt 221.6 lb

## 2016-04-20 DIAGNOSIS — R072 Precordial pain: Secondary | ICD-10-CM | POA: Insufficient documentation

## 2016-04-20 DIAGNOSIS — I10 Essential (primary) hypertension: Secondary | ICD-10-CM | POA: Diagnosis not present

## 2016-04-20 DIAGNOSIS — E1140 Type 2 diabetes mellitus with diabetic neuropathy, unspecified: Secondary | ICD-10-CM | POA: Diagnosis not present

## 2016-04-20 MED ORDER — IBUPROFEN 200 MG PO CAPS: ORAL_CAPSULE | ORAL | 0 refills | Status: DC

## 2016-04-20 NOTE — Patient Instructions (Addendum)
MEDICATION:  IBUPROFEN  600 MG  3 TIMES A DAY FOR 3 DAYS,  400 MG 3 TIMES A DAY FOR 3 DAYS ,  200 MG  3 TIMES A DAY THEN STOP   Your physician recommends that you schedule a follow-up appointment in [place].   If you need a refill on your cardiac medications before your next appointment, please call your pharmacy.

## 2016-04-20 NOTE — Progress Notes (Signed)
PCP: Owens Loffler, MD  Clinic Note: Chief Complaint  Patient presents with  . New Patient (Initial Visit)    some dizziness when just standing up from a seating position   . Chest Pain    pt states she has an ache right between her breast and sometimes its sharp   . Edema    both ankles     HPI: [name] is a 65 y.o. female with a PMH below who presents today for initial cardiology consultation for CP.    [name] was last seen well over 3 yrs ago.  Had been seen for CP - Lexiscan Myoview was Negative for ischemia.  We did not do a treadmill stress test back at bedtime because of concerns with back and hip pain keeping her from walking on treadmill.  Recent Hospitalizations: n/a  Studies Reviewed:   Echo 2009: Normal LV size and function. EF 60-65%. No regional wall motion and amount is. Mild aortic sclerosis, no stenosis  Interval History: [name] presents today with complaints of distant sharp burning pain in the center of the chest that is not at all associated with exertion. She was seen by GI and is currently undergoing evaluation, but they felt like he needed to have her evaluated by cardiology. She notes that this discomfort is happening at various times during the day. Can be early in the morning or at night when lying down. Occasionally occurs during the day, but not necessarily ever associated with exertion. The episodes can be short lasting for seconds to minutes or ongoing. It is sharp and aching discomfort. She is cleared point out it does not get worse with activity. It is worse when she lies down at night or wakes up in the morning. She denies any PND, orthopnea - but does note mild ankle swelling. She denies any resting or exertional dyspnea. She has a few "flip-flopping in her chest last maybe 1 or 2 seconds at most. Does not have any association with feeling dizzy or woozy. No lightheadedness or weakness.  No syncope/near syncope. No TIA/amaurosis fugax  symptoms. No claudication.  ROS: A comprehensive was performed. Review of Systems  Constitutional: Negative for chills, fever and weight loss.  HENT: Negative for congestion and nosebleeds.   Respiratory: Negative for cough, shortness of breath and wheezing.   Cardiovascular: Negative for leg swelling.  Gastrointestinal: Negative for abdominal pain, blood in stool and constipation.       These symptoms don't really feel like GERD.Marland Kitchen No relief with antacids  Genitourinary: Negative for hematuria.  Musculoskeletal: Positive for back pain and joint pain (Knee). Negative for myalgias.  Neurological: Negative for dizziness and headaches.  Psychiatric/Behavioral: Negative for depression and memory loss. The patient is not nervous/anxious and does not have insomnia.   All other systems reviewed and are negative.   Past Medical History:  Diagnosis Date  . B12 deficiency   . Depression   . Fibromyalgia   . Generalized hyperhidrosis 04/17/2014  . GERD (gastroesophageal reflux disease)   . Hyperlipemia   . HYPOTHYROIDISM 05/29/2007  . Irritable bowel syndrome   . Migraines   . Type II or unspecified type diabetes mellitus with neurological manifestations, not stated as uncontrolled(250.60) 04/07/2013  . Unspecified essential hypertension     Past Surgical History:  Procedure Laterality Date  . [place] STUDY N/A 01/04/2016   Procedure: [place] STUDY;  Surgeon: Manus Gunning, MD;  Location: [facility];  Service: Gastroenterology;  Laterality: N/A;  . ABDOMINAL HYSTERECTOMY  1988  . ANTERIOR CERVICAL DECOMP/DISCECTOMY FUSION  2009, 1993   first procedure in ~ 1993/Dr Botero  . APPENDECTOMY  1988  . CHOLECYSTECTOMY  2005  . ESOPHAGEAL MANOMETRY N/A 01/04/2016   Procedure: ESOPHAGEAL MANOMETRY (EM) 24 HR PH;  Surgeon: Manus Gunning, MD;  Location: [facility];  Service: Gastroenterology;  Laterality: N/A;  . NASAL SINUS SURGERY  2006  . TONSILLECTOMY AND  ADENOIDECTOMY    . TRANSTHORACIC ECHOCARDIOGRAM  2009   Normal LV size and function. EF 60-65%. No regional wall motion and amount is. Mild aortic sclerosis, no stenosis  . TUBAL LIGATION  1979    Prior to Admission medications   Medication Sig Start Date Taking? Authorizing Provider  aspirin 81 MG tablet Take 81 mg by mouth daily.    Yes Historical Provider, MD  B Complex Vitamins (B COMPLEX 100 PO) Take 1 tablet by mouth daily.  Yes Historical Provider, MD  Blood Glucose Monitoring Suppl (ACCU-CHEK [name] PLUS) W/DEVICE KIT Check blood sugar once a day and as directed. Dx E11.40 05/26/15 Yes Owens Loffler, MD  Calcium Carbonate (CALCIUM 600 PO) Take 600 mg by mouth 2 (two) times daily.   Yes Historical Provider, MD  Cholecalciferol (VITAMIN D) 2000 UNITS tablet Take 2,000 Units by mouth daily.    Yes Historical Provider, MD  citalopram (CELEXA) 40 MG tablet Take 1 tablet by mouth  daily 11/16/15 Yes Spencer Copland, MD  clindamycin (CLEOCIN) 300 MG capsule Take 600 mg by mouth. before dental appointments 03/10/14 Yes Historical Provider, MD  Coenzyme Q-10 100 MG capsule Take 100 mg by mouth daily.  Yes Historical Provider, MD  Dexlansoprazole 30 MG capsule Take 1 capsule (30 mg total) by mouth daily. 01/14/16 Yes Manus Gunning, MD  diclofenac sodium (VOLTAREN) 1 % GEL Apply 4 g topically 4 (four) times daily. 08/06/14 Yes Spencer Copland, MD  docusate sodium (COLACE) 100 MG capsule Take 200 mg by mouth at bedtime.   Yes Historical Provider, MD  gabapentin (NEURONTIN) 600 MG tablet Take 1 tablet by mouth 3  times daily 03/04/16 Yes Charlett Blake, MD  glucose blood (ACCU-CHEK [name] PLUS) test strip Check blood sugar once a day and as directed. Dx E11.40 05/26/15 Yes Owens Loffler, MD  Lancet Devices [facility]) lancets Check blood sugar once a day and as directed. Dx E11.40, Normal 06/02/15 Yes Owens Loffler, MD  levothyroxine (SYNTHROID, LEVOTHROID) 50 MCG tablet Take 1  tablet by mouth  daily 11/16/15 Yes Spencer Copland, MD  loratadine (CLARITIN) 10 MG tablet Take 10 mg by mouth daily.  Yes Historical Provider, MD  magnesium gluconate (MAGONATE) 500 MG tablet Take 1,000 mg by mouth at bedtime.   Yes Historical Provider, MD  methocarbamol (ROBAXIN) 500 MG tablet TAKE ONE (1) TABLET BY MOUTH EVERY EIGHTHOURS AS NEEDED FOR MUSCLE SPASMS 01/20/16 Yes Bayard Hugger, NP  mirtazapine (REMERON) 15 MG tablet Take 1 tablet by mouth at  bedtime 11/16/15 Yes Owens Loffler, MD  morphine (MS CONTIN) 15 MG 12 hr tablet Take 1 tablet (15 mg total) by mouth 3 (three) times daily. 04/01/16 Yes Bayard Hugger, NP  mupirocin ointment (BACTROBAN) 2 % PLACE 1 APPLICATION INTO THE NOSE 2 TIMES DAILY FOR 1 WEEK 09/30/15 Yes Spencer Copland, MD  oxybutynin (DITROPAN-XL) 10 MG 24 hr tablet TAKE ONE TABLET BY MOUTH EVERY NIGHT AT BEDTIME 04/06/16 Yes Spencer Copland, MD  senna-docusate (PERI-COLACE) 8.6-50 MG per tablet Take 4 tablets by  mouth at bedtime as needed for mild constipation.   Yes Historical Provider, MD    Allergies  Allergen Reactions  . Rizatriptan Benzoate Anaphylaxis    Throat and Tongue Swelling Closed  . Sumatriptan Other (See Comments)    Throat closed & Tongue Swelling  . Tizanidine Shortness Of Breath  . Aleve [Naproxen Sodium] Hives  . Clarithromycin   . Cyclobenzaprine Hcl     REACTION: THROAT CLOSED,TOUNGE SWELLING  . Doxycycline   . Duloxetine   . Keflex [Cephalexin] Itching    Sores  . Metronidazole   . Oxycodone Hcl Diarrhea    Vomiting  . Penicillins     TOLERATES CEPHALOSPORINS  . Rosuvastatin     REACTION: MUSCLE ACHES  . Septra [Sulfamethoxazole-Trimethoprim] Other (See Comments)    Rash and itching  . Tramadol   . Venlafaxine Hives  . Voltaren [Diclofenac Sodium] Hives    oral    Social History   Social History  . Marital status: Married    Spouse name: N/A  . Number of children: N/A  . Years of education: N/A   Occupational  History  . Bookkeeper    Social History Main Topics  . Smoking status: Never Smoker  . Smokeless tobacco: Never Used  . Alcohol use No  . Drug use: No  . Sexual activity: Not Asked   Other Topics Concern  . None   Social History Narrative  . None   Family History  Problem Relation Age of Onset  . Breast cancer Mother   . Diabetes Mother   . Heart disease Mother   . Allergies Mother   . Diabetes Father   . Heart disease Father   . Emphysema Father   . Allergies Father   . Stomach cancer Maternal Uncle   . Breast cancer Paternal Aunt   . Colon cancer Neg Hx     Wt Readings from Last 3 Encounters:  04/20/16 100.5 kg (221 lb 9.6 oz)  04/18/16 91.1 kg (200 lb 12 oz)  12/04/15 98.4 kg (217 lb)  - wgt last week was 220 not 200.   PHYSICAL EXAM BP (!) 144/78   Pulse 64   Ht _0  (1.676 m)   Wt 100.5 kg (221 lb 9.6 oz)   BMI 35.77 kg/m  General appearance: alert, cooperative, appears stated age, no distress and Moderately obese. Well-nourished well-groomed. HEENT: [place]/AT, EOMI, MMM, anicteric sclera Neck: no adenopathy, no carotid bruit and no JVD Lungs: clear to auscultation bilaterally, normal percussion bilaterally and non-labored Heart: regular rate and rhythm, S1 &S2 normal, no murmur, click, rub or gallop - somewhat distant heart sounds. Unable to palpate PMI. - She has point tenderness along the lower third of the sternal margin. Left is more notable than right. Abdomen: soft, non-tender; bowel sounds normal; no masses,  no organomegaly; no HJR Extremities: extremities normal, atraumatic, no cyanosis, and Trace edema Pulses: 2+ and symmetric;  Skin: mobility and turgor normal or  Neurologic: Mental status: Alert, oriented, thought content appropriate Cranial nerves: normal (II-XII grossly intact)   Adult ECG Report  Rate: 64 ;  Rhythm: normal sinus rhythm and Normal axis, intervals and durations;   Narrative Interpretation: Normal, stable EKG  Other studies  Reviewed: Additional studies/ records that were reviewed today include:  Recent Labs:  n/a    ASSESSMENT / PLAN: Problem List Items Addressed This Visit    Type 2 diabetes mellitus with diabetic neuropathy ([place]) (Chronic)    Now, according to her, her  diabetes is diet controlled.      Essential hypertension (Chronic)    Borderline pressures today. She is not on any antihypertensives. We'll reassess at follow-up.      Relevant Orders   EKG 12-Lead (Completed)   Chest pain, precordial - Primary    This chest pain sounds a lot like costochondritis. It does not sound anginal in nature because it is not at all associated with exertion. Does not sound like spasm either. Based on the true atypical nature of his symptoms, I don't really think that going to a Myoview stress test is necessary without giving a trial attempt at treating costochondritis. She asked that she probably would not be able to do routine GXT because of her back and hip pain. Therefore we would be limited to doing a pharmacologic Myoview.  Plan: Short course of ibuprofen taper  600 MG  3 TIMES A DAY FOR 3 DAYS,  400 MG 3 TIMES A DAY FOR 3 DAYS ,  200 MG  3 TIMES A DAY THEN STOP  I'll then see her back in follow-up to see if her symptoms improve. If they have, then I think no further evaluation is necessary.      Relevant Orders   EKG 12-Lead (Completed)    Other Visit Diagnoses   None.     Current medicines are reviewed at length with the patient today. (+/- concerns) none The following changes have been made: none  Patient Instructions  MEDICATION:  IBUPROFEN  600 MG  3 TIMES A DAY FOR 3 DAYS,  400 MG 3 TIMES A DAY FOR 3 DAYS ,  200 MG  3 TIMES A DAY THEN STOP   Your physician recommends that you schedule a follow-up appointment in [place].   If you need a refill on your cardiac medications before your next appointment, please call your pharmacy.    Studies Ordered:   Orders  Placed This Encounter  Procedures  . EKG 12-Lead      Glenetta Hew, M.D., M.S. Interventional Cardiologist   Pager # [phone] Phone # [phone] [address] [place], [place] [address]

## 2016-04-22 ENCOUNTER — Telehealth: Payer: Self-pay

## 2016-04-22 ENCOUNTER — Encounter: Payer: Self-pay | Admitting: Cardiology

## 2016-04-22 NOTE — Assessment & Plan Note (Signed)
Now, according to her, her diabetes is diet controlled.

## 2016-04-22 NOTE — Assessment & Plan Note (Signed)
Borderline pressures today. She is not on any antihypertensives. We'll reassess at follow-up.

## 2016-04-22 NOTE — Telephone Encounter (Signed)
-----   Message from Manus Gunning, MD sent at 04/21/2016 12:30 PM EDT ----- No I would like to save the banding appointments if possible for banding. Let's take a look at my schedule and I'll fit her in somewhere. I'll get back to you. Thanks   ----- Message ----- From: Doristine Counter, RN Sent: 04/21/2016  11:31 AM To: Manus Gunning, MD  Good Morning! You only have a banding appt. On 11/28 at 4:00 available. Would you like her scheduled with one of the Pa's?  ----- Message ----- From: Manus Gunning, MD Sent: 04/21/2016   7:29 AM To: Doristine Counter, RN  Almyra Free can you help book Mrs. Townshend a follow up appointment with me, if I have anything open in the next few weeks? thanks

## 2016-04-22 NOTE — Telephone Encounter (Signed)
Patient scheduled for a follow up on 11/3 @ 9:45.jf

## 2016-04-22 NOTE — Assessment & Plan Note (Signed)
This chest pain sounds a lot like costochondritis. It does not sound anginal in nature because it is not at all associated with exertion. Does not sound like spasm either. Based on the true atypical nature of his symptoms, I don't really think that going to a Myoview stress test is necessary without giving a trial attempt at treating costochondritis. She asked that she probably would not be able to do routine GXT because of her back and hip pain. Therefore we would be limited to doing a pharmacologic Myoview.  Plan: Short course of ibuprofen taper  600 MG  3 TIMES A DAY FOR 3 DAYS,  400 MG 3 TIMES A DAY FOR 3 DAYS ,  200 MG  3 TIMES A DAY THEN STOP  I'll then see her back in follow-up to see if her symptoms improve. If they have, then I think no further evaluation is necessary.

## 2016-04-25 NOTE — Telephone Encounter (Signed)
Great - thanks

## 2016-04-29 ENCOUNTER — Encounter: Payer: Self-pay | Admitting: Gastroenterology

## 2016-04-29 ENCOUNTER — Ambulatory Visit (INDEPENDENT_AMBULATORY_CARE_PROVIDER_SITE_OTHER): Payer: Medicare Other | Admitting: Gastroenterology

## 2016-04-29 VITALS — BP 128/78 | HR 72 | Ht 66.25 in | Wt 223.2 lb

## 2016-04-29 DIAGNOSIS — R1310 Dysphagia, unspecified: Secondary | ICD-10-CM | POA: Diagnosis not present

## 2016-04-29 DIAGNOSIS — K219 Gastro-esophageal reflux disease without esophagitis: Secondary | ICD-10-CM | POA: Diagnosis not present

## 2016-04-29 NOTE — Progress Notes (Signed)
 HPI :  65-year-old female with long-standing symptoms of reflux and dysphagia, here for a follow-up visit.   Since her last visit with us she's had a barium swallow, esophageal manometry, and 24-hour pH impedance study -these results are as outlined below. Her DeMeester score was 24 on the pH study and she was switched to Dexilant 60 mg a day for month which did not help her symptoms.   She reports she has since stopped all PPIs and has not been having some discomfort in her chest which has been bothering her lately. She has been seen by her cardiologist and they did not think her symptoms were cardiac related. She states it comes and goes, wakes her up at night or early AM. Sometimes it lasts for days at a time, sometimes not at all. She was given course of steroid pack and ibuprofen for possible costochondritis which did not help. She denies any tenderness to the chest wall. She is not sure if symptoms are worsened with eating, sometimes worse if she hasn't eaten. She has had symptoms of pyrosis / regurgitatoin as her main reflux symptoms. She is taking rolaids PRN for reflux, she is not taking anything routinely. She has taken some zegerid OTC which she thinks helps some when she takes it. She reports dysphagia frequently which bothers her. She is not vomiting. She is eating okay otherwise, doesn't endorse early satiety. She has been off PPIs since August. She states she has been on every PPI that exists. Of all PPIs she reports AcipHex has worked best for her.  Prior workup: Barium swallow 11/2015 - mild to moderate dysmotility, small hiatal hernia 24 HR pH  impedance study - Demeester score of 24.4, symptom index 33%.  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 B12 deficiency   .  Depression   . Fibromyalgia   . Generalized hyperhidrosis 04/17/2014  . GERD (gastroesophageal reflux disease)   . Hyperlipemia   . HYPOTHYROIDISM 05/29/2007  . Irritable bowel syndrome   . Migraines   . Type II or unspecified type diabetes mellitus with neurological manifestations, not stated as uncontrolled(250.60) 04/07/2013  . Unspecified essential hypertension      Past Surgical History:  Procedure Laterality Date  . 24 HOUR PH STUDY N/A 01/04/2016   Procedure: 24 HOUR PH STUDY;  Surgeon: Kristofor Michalowski Paul Shonta Phillis, MD;  Location: [facility];  Service: Gastroenterology;  Laterality: N/A;  . ABDOMINAL HYSTERECTOMY  1988  . ANTERIOR CERVICAL DECOMP/DISCECTOMY FUSION  2009, 1993   first procedure in ~ 1993/Dr Botero  . APPENDECTOMY  1988  . CHOLECYSTECTOMY  2005  . ESOPHAGEAL MANOMETRY N/A 01/04/2016   Procedure: ESOPHAGEAL MANOMETRY (EM) 24 HR PH;  Surgeon: Tipton Ballow Paul Melesio Madara, MD;  Location: [facility];  Service: Gastroenterology;  Laterality: N/A;  . NASAL SINUS SURGERY  2006  . TONSILLECTOMY AND ADENOIDECTOMY    . TRANSTHORACIC ECHOCARDIOGRAM  2009   Normal LV size and function. EF 60-65%. No regional wall motion and amount is. Mild aortic sclerosis, no stenosis  . TUBAL LIGATION  1979   Family History  Problem Relation Age of Onset  . Breast cancer Mother   . Diabetes Mother   . Heart disease Mother   . Allergies Mother   . Diabetes Father   . Heart disease Father   .   Emphysema Father   . Allergies Father   . Stomach cancer Maternal Uncle   . Breast cancer Paternal Aunt   . Colon cancer Neg Hx    Social History  Substance Use Topics  . Smoking status: Never Smoker  . Smokeless tobacco: Never Used  . Alcohol use No   Current Outpatient Prescriptions  Medication Sig Dispense Refill  . aspirin 81 MG tablet Take 81 mg by mouth daily.      . B Complex Vitamins (B COMPLEX 100 PO) Take 1 tablet by mouth daily.    . Blood Glucose Monitoring Suppl (ACCU-CHEK [name]  PLUS) W/DEVICE KIT Check blood sugar once a day and as directed. Dx E11.40 1 kit 0  . Calcium Carbonate (CALCIUM 600 PO) Take 600 mg by mouth 2 (two) times daily.     . Cholecalciferol (VITAMIN D) 2000 UNITS tablet Take 2,000 Units by mouth daily.      . citalopram (CELEXA) 40 MG tablet Take 1 tablet by mouth  daily 90 tablet 1  . clindamycin (CLEOCIN) 300 MG capsule Take 600 mg by mouth. before dental appointments    . Coenzyme Q-10 100 MG capsule Take 100 mg by mouth daily.    . diclofenac sodium (VOLTAREN) 1 % GEL Apply 4 g topically 4 (four) times daily. 5 Tube prn  . docusate sodium (COLACE) 100 MG capsule Take 200 mg by mouth at bedtime.     . gabapentin (NEURONTIN) 600 MG tablet Take 1 tablet by mouth 3  times daily 270 tablet 0  . glucose blood (ACCU-CHEK [name] PLUS) test strip Check blood sugar once a day and as directed. Dx E11.40 100 each 3  . Ibuprofen 200 MG CAPS 600 MG  3 TIMES A DAY FOR 3 DAYS, 400 MG 3 TIMES A DAY FOR 3 DAYS , 200 MG  3 TIMES A DAY THEN STOP 120 each 0  . Lancet Devices (ACCU-CHEK SOFTCLIX) lancets Check blood sugar once a day and as directed. Dx E11.40, Normal 1 each 0  . levothyroxine (SYNTHROID, LEVOTHROID) 50 MCG tablet Take 1 tablet by mouth  daily 90 tablet 1  . loratadine (CLARITIN) 10 MG tablet Take 10 mg by mouth daily.    . magnesium gluconate (MAGONATE) 500 MG tablet Take 1,000 mg by mouth at bedtime.     . methocarbamol (ROBAXIN) 500 MG tablet TAKE ONE (1) TABLET BY MOUTH EVERY EIGHTHOURS AS NEEDED FOR MUSCLE SPASMS 180 tablet 3  . mirtazapine (REMERON) 15 MG tablet Take 1 tablet by mouth at  bedtime 90 tablet 1  . morphine (MS CONTIN) 15 MG 12 hr tablet Take 1 tablet (15 mg total) by mouth 3 (three) times daily. 90 tablet 0  . mupirocin ointment (BACTROBAN) 2 % PLACE 1 APPLICATION INTO THE NOSE 2 TIMES DAILY FOR 1 WEEK 22 g 0  . oxybutynin (DITROPAN-XL) 10 MG 24 hr tablet TAKE ONE TABLET BY MOUTH EVERY NIGHT AT BEDTIME 90 tablet 3  . senna-docusate  (PERI-COLACE) 8.6-50 MG per tablet Take 4 tablets by mouth at bedtime as needed for mild constipation.      No current facility-administered medications for this visit.    Allergies  Allergen Reactions  . Rizatriptan Benzoate Anaphylaxis    Throat and Tongue Swelling Closed  . Sumatriptan Other (See Comments)    Throat closed & Tongue Swelling  . Tizanidine Shortness Of Breath  . Aleve [Naproxen Sodium] Hives  . Clarithromycin   . Cyclobenzaprine Hcl     REACTION:   THROAT CLOSED,TOUNGE SWELLING  . Doxycycline   . Duloxetine   . Keflex [Cephalexin] Itching    Sores  . Metronidazole   . Oxycodone Hcl Diarrhea    Vomiting  . Penicillins     TOLERATES CEPHALOSPORINS  . Rosuvastatin     REACTION: MUSCLE ACHES  . Septra [Sulfamethoxazole-Trimethoprim] Other (See Comments)    Rash and itching  . Tramadol   . Venlafaxine Hives  . Voltaren [Diclofenac Sodium] Hives    oral     Review of Systems: All systems reviewed and negative except where noted in HPI.    Lab Results  Component Value Date   WBC 7.1 04/18/2016   HGB 12.6 04/18/2016   HCT 37.3 04/18/2016   MCV 89.6 04/18/2016   PLT 258.0 04/18/2016    Lab Results  Component Value Date   ALT 36 (H) 04/18/2016   AST 33 04/18/2016   ALKPHOS 98 04/18/2016   BILITOT 0.3 04/18/2016    Lab Results  Component Value Date   CREATININE 0.75 04/18/2016   BUN 12 04/18/2016   NA 141 04/18/2016   K 4.2 04/18/2016   CL 102 04/18/2016   CO2 34 (H) 04/18/2016     Physical Exam: BP 128/78   Pulse 72   Ht 5' 6.25" (1.683 m)   Wt 223 lb 4 oz (101.3 kg)   BMI 35.76 kg/m  Constitutional: Pleasant,well-developed, female in no acute distress. HEENT: Normocephalic and atraumatic. Conjunctivae are normal. No scleral icterus. Neck supple.  Cardiovascular: Normal rate, regular rhythm.  Pulmonary/chest: Effort normal and breath sounds normal. No wheezing, rales or rhonchi. Abdominal: Soft, nondistended, protuberant, nontender.  There are no masses palpable. No hepatomegaly. Extremities: no edema Lymphadenopathy: No cervical adenopathy noted. Neurological: Alert and oriented to person place and time. Skin: Skin is warm and dry. No rashes noted. Psychiatric: Normal mood and affect. Behavior is normal.   ASSESSMENT AND PLAN: 65-year-old female with long-standing GERD and dysphagia with an extensive evaluation as outlined above. Her 24 hour pH impedance showed a DeMeester in the 20s while on PPI, she clearly has breakthrough reflux although interestingly during that exam her symptom index was below 50%. She has since stopped all PPIs and more recently has been having intermittent chest discomfort and worsening reflux symptoms, which I suspect are related. She may likely have esophagitis off PPI. She's been seen by cardiology who did not think this was cardiac in nature.  I discussed options with her moving forward. She reports feeling the best when she was taking AcipHex previously, we'll put her on 20 mg twice a day, and also add Zantac 150 twice a day in hopes we can improve her symptoms. She can also use Gaviscon when necessary for breakthrough. It also been 5 years since her last upper endoscopy in her dysphagia has been persistent, without a clear etiology on manometry and barium study, so I offered her an upper endoscopy with empiric dilation to see if this can provide any benefit. Pending her course, if symptoms persist despite maximal medical therapy we may consider a surgical evaluation, although this has potential to make her dysphagia worse and we will need to discuss risks benefits with her.  She agrees with the plan, all questions answered.  Kellye Mizner, MD [facility] Pager [phone]  

## 2016-04-29 NOTE — Patient Instructions (Signed)
If you are age 64 or older, your body mass index should be between 23-30. Your Body mass index is 35.76 kg/m. If this is out of the aforementioned range listed, please consider follow up with your Primary Care Provider.  If you are age 17 or younger, your body mass index should be between 19-25. Your Body mass index is 35.76 kg/m. If this is out of the aformentioned range listed, please consider follow up with your Primary Care Provider.   You have been scheduled for an endoscopy. Please follow written instructions given to you at your visit today. If you use inhalers (even only as needed), please bring them with you on the day of your procedure. Your physician has requested that you go to www.startemmi.com and enter the access code given to you at your visit today. This web site gives a general overview about your procedure. However, you should still follow specific instructions given to you by our office regarding your preparation for the procedure.  We have sent the following medications to your pharmacy for you to pick up at your convenience:  Aciphex 20mg - Take twice daily  Zantac 150mg - Take twice daily  Gaviscon- as needed  Thank you.

## 2016-04-30 ENCOUNTER — Other Ambulatory Visit: Payer: Self-pay | Admitting: Family Medicine

## 2016-05-02 ENCOUNTER — Encounter: Payer: Self-pay | Admitting: Pulmonary Disease

## 2016-05-02 ENCOUNTER — Telehealth: Payer: Self-pay | Admitting: *Deleted

## 2016-05-02 ENCOUNTER — Ambulatory Visit (HOSPITAL_BASED_OUTPATIENT_CLINIC_OR_DEPARTMENT_OTHER): Payer: Medicare Other | Admitting: Physical Medicine & Rehabilitation

## 2016-05-02 ENCOUNTER — Telehealth: Payer: Self-pay | Admitting: Family Medicine

## 2016-05-02 ENCOUNTER — Encounter: Payer: Self-pay | Admitting: Physical Medicine & Rehabilitation

## 2016-05-02 ENCOUNTER — Encounter: Payer: Medicare Other | Attending: Physical Medicine & Rehabilitation

## 2016-05-02 ENCOUNTER — Ambulatory Visit (INDEPENDENT_AMBULATORY_CARE_PROVIDER_SITE_OTHER): Payer: Medicare Other | Admitting: Pulmonary Disease

## 2016-05-02 VITALS — BP 141/70 | HR 66 | Resp 14

## 2016-05-02 VITALS — BP 112/62 | HR 64 | Ht 66.0 in | Wt 223.4 lb

## 2016-05-02 DIAGNOSIS — G4734 Idiopathic sleep related nonobstructive alveolar hypoventilation: Secondary | ICD-10-CM

## 2016-05-02 DIAGNOSIS — E662 Morbid (severe) obesity with alveolar hypoventilation: Secondary | ICD-10-CM | POA: Diagnosis not present

## 2016-05-02 DIAGNOSIS — Z5181 Encounter for therapeutic drug level monitoring: Secondary | ICD-10-CM | POA: Insufficient documentation

## 2016-05-02 DIAGNOSIS — Z79899 Other long term (current) drug therapy: Secondary | ICD-10-CM | POA: Diagnosis not present

## 2016-05-02 DIAGNOSIS — G894 Chronic pain syndrome: Secondary | ICD-10-CM | POA: Insufficient documentation

## 2016-05-02 DIAGNOSIS — J9611 Chronic respiratory failure with hypoxia: Secondary | ICD-10-CM | POA: Diagnosis not present

## 2016-05-02 DIAGNOSIS — M40204 Unspecified kyphosis, thoracic region: Secondary | ICD-10-CM | POA: Diagnosis not present

## 2016-05-02 MED ORDER — MORPHINE SULFATE ER 15 MG PO TBCR: 15.0000 mg | EXTENDED_RELEASE_TABLET | Freq: Three times a day (TID) | ORAL | 0 refills | Status: DC

## 2016-05-02 NOTE — Telephone Encounter (Signed)
Pt needs an xray and would like to do it here. Per the pt, dr Beaulah Corin has sent over an order for the pt.  Call back number for pt is [phone]

## 2016-05-02 NOTE — Progress Notes (Signed)
Subjective:    Patient ID: [name], female    DOB: [birth year], 65 y.o.   MRN: [id number]  HPI CC:  Mid back pain Pain does not radiate. No rib pain, but has had chest pain, epigastric, cardiac source R/o ed by cardiology now seeing GI for reflux.   Also c/o bilateral knee pain.  L>R prior xray 08/06/2014 showed mild medial jt space and patellofemoral DJD Neck pain is well controlled on current medication regimen No complaints of opioid-induced constipation, continues to take stool softener daily Pain Inventory Average Pain 6 Pain Right Now 6 My pain is constant, burning and aching  In the last 24 hours, has pain interfered with the following? General activity 7 Relation with others 7 Enjoyment of life 7 What TIME of day is your pain at its worst? daytime, night Sleep (in general) Fair  Pain is worse with: walking, bending, sitting, inactivity, standing and some activites Pain improves with: rest, heat/ice, therapy/exercise, pacing activities, medication, TENS and injections Relief from Meds: 4  Mobility walk without assistance how many minutes can you walk? 10-15 ability to climb steps?  yes do you drive?  yes Do you have any goals in this area?  yes  Function employed # of hrs/week 10hrs/week disabled: date disabled 12/2008 I need assistance with the following:  meal prep, household duties and shopping Do you have any goals in this area?  yes  Neuro/Psych bladder control problems weakness numbness tremor tingling depression  Prior Studies Any changes since last visit?  no  Physicians involved in your care Any changes since last visit?  no   Family History  Problem Relation Age of Onset  . Breast cancer Mother   . Diabetes Mother   . Heart disease Mother   . Allergies Mother   . Diabetes Father   . Heart disease Father   . Emphysema Father   . Allergies Father   . Stomach cancer Maternal Uncle   . Breast cancer Paternal Aunt   . Colon cancer Neg  Hx    Social History   Social History  . Marital status: Married    Spouse name: N/A  . Number of children: N/A  . Years of education: N/A   Occupational History  . Bookkeeper    Social History Main Topics  . Smoking status: Never Smoker  . Smokeless tobacco: Never Used  . Alcohol use No  . Drug use: No  . Sexual activity: Not Asked   Other Topics Concern  . None   Social History Narrative  . None   Past Surgical History:  Procedure Laterality Date  . [place] STUDY N/A 01/04/2016   Procedure: [place] STUDY;  Surgeon: Manus Gunning, MD;  Location: [facility];  Service: Gastroenterology;  Laterality: N/A;  . ABDOMINAL HYSTERECTOMY  1988  . ANTERIOR CERVICAL DECOMP/DISCECTOMY FUSION  2009, 1993   first procedure in ~ 1993/Dr Botero  . APPENDECTOMY  1988  . CHOLECYSTECTOMY  2005  . ESOPHAGEAL MANOMETRY N/A 01/04/2016   Procedure: ESOPHAGEAL MANOMETRY (EM) 24 HR PH;  Surgeon: Manus Gunning, MD;  Location: [facility];  Service: Gastroenterology;  Laterality: N/A;  . NASAL SINUS SURGERY  2006  . TONSILLECTOMY AND ADENOIDECTOMY    . TRANSTHORACIC ECHOCARDIOGRAM  2009   Normal LV size and function. EF 60-65%. No regional wall motion and amount is. Mild aortic sclerosis, no stenosis  . TUBAL LIGATION  1979   Past Medical History:  Diagnosis Date  .  B12 deficiency   . Depression   . Fibromyalgia   . Generalized hyperhidrosis 04/17/2014  . GERD (gastroesophageal reflux disease)   . Hyperlipemia   . HYPOTHYROIDISM 05/29/2007  . Irritable bowel syndrome   . Migraines   . Type II or unspecified type diabetes mellitus with neurological manifestations, not stated as uncontrolled(250.60) 04/07/2013  . Unspecified essential hypertension    BP (!) 141/70   Pulse 66   Resp 14   SpO2 98%   Opioid Risk Score:   Fall Risk Score:  `1  Depression screen PHQ 2/9  Depression screen [facility] 2/9 02/03/2016 10/23/2015 10/05/2015 02/04/2015 09/11/2014 10/15/2013    Decreased Interest 0 2 2 0 2 0  Down, Depressed, Hopeless 0 0 0 0 0 0  PHQ - 2 Score 0 2 2 0 2 0  Altered sleeping - - 3 - 0 -  Tired, decreased energy - - 1 - 2 -  Change in appetite - - 0 - 0 -  Feeling bad or failure about yourself  - - 0 - 0 -  Trouble concentrating - - 1 - 1 -  Moving slowly or fidgety/restless - - 0 - 0 -  Suicidal thoughts - - 0 - 0 -  PHQ-9 Score - - 7 - 5 -  Difficult doing work/chores - - Somewhat difficult - - -  Some recent data might be hidden    Review of Systems  Musculoskeletal:       Limb swelling  All other systems reviewed and are negative.      Objective:   Physical Exam  Constitutional: She is oriented to person, place, and time. She appears well-developed and well-nourished.  HENT:  Head: Normocephalic and atraumatic.  Eyes: Conjunctivae and EOM are normal. Pupils are equal, round, and reactive to light.  Neck: No JVD present.  Decreased range of motion, 75% flexion, extension, lateral rotation and bending  Musculoskeletal:       Cervical back: She exhibits decreased range of motion. She exhibits no tenderness.       Thoracic back: She exhibits decreased range of motion, tenderness and deformity. She exhibits no bony tenderness and no spasm.       Lumbar back: She exhibits decreased range of motion. She exhibits no tenderness and no deformity.  Lymphadenopathy:    She has no cervical adenopathy.  Neurological: She is alert and oriented to person, place, and time. She has normal strength. She displays no atrophy. No sensory deficit. Coordination and gait normal.  Reflex Scores:      Tricep reflexes are 1+ on the right side and 1+ on the left side.      Bicep reflexes are 1+ on the right side and 1+ on the left side.      Brachioradialis reflexes are 1+ on the right side and 1+ on the left side.      Patellar reflexes are 1+ on the right side and 1+ on the left side.      Achilles reflexes are 1+ on the right side and 1+ on the left  side. Motor strength is 5/5 bilateral deltoids, biceps, triceps, grip, hip flexor, knee extensor Normal pinprick sensation bilateral limbs  Skin: Skin is warm and dry. No rash noted. No erythema.  No dermatomal rash over the thoracic area  Psychiatric: She has a normal mood and affect. Her behavior is normal. Judgment and thought content normal.  Nursing note and vitals reviewed.  Tenderness, paraspinal T6-T8 extending toward the inferior  aspect of the right scapula.       Assessment & Plan:  1. Mid back pain. She has had problems with this in the past. In fact, had MRI of the thoracic spine about 10 years ago, which was fairly unremarkable. A couple small disc bulges. She has no radicular symptoms at the current time. She does have poor, kyphotic posture. This is most likely cause, however, may be developing some additional kyphotic deformity, which could be degenerative. Will recheck thoracic spine films. No need for MRI at turn time. Certainly no evidence of neurologic compromise. Will encourage patient to restart theraband exercises, rowing with bilateral upper extremities  2. Cervical postlaminectomy syndrome-continue MS Contin 15 mg 3 times a day Continue methocarbamol 500 mg every 8 hours as needed Continue gabapentin 600 mg 3 times a day  Follow-up nurse practitioner, 1 month

## 2016-05-02 NOTE — Progress Notes (Signed)
Current Outpatient Prescriptions on File Prior to Visit  Medication Sig  . aspirin 81 MG tablet Take 81 mg by mouth daily.    . B Complex Vitamins (B COMPLEX 100 PO) Take 1 tablet by mouth daily.  . Blood Glucose Monitoring Suppl (ACCU-CHEK [name] PLUS) W/DEVICE KIT Check blood sugar once a day and as directed. Dx E11.40  . Calcium Carbonate (CALCIUM 600 PO) Take 600 mg by mouth 2 (two) times daily.   . Cholecalciferol (VITAMIN D) 2000 UNITS tablet Take 2,000 Units by mouth daily.    . citalopram (CELEXA) 40 MG tablet Take 1 tablet by mouth  daily  . Coenzyme Q-10 100 MG capsule Take 100 mg by mouth daily.  . diclofenac sodium (VOLTAREN) 1 % GEL Apply 4 g topically 4 (four) times daily.  Marland Kitchen docusate sodium (COLACE) 100 MG capsule Take 200 mg by mouth at bedtime.   . gabapentin (NEURONTIN) 600 MG tablet Take 1 tablet by mouth 3  times daily  . glucose blood (ACCU-CHEK [name] PLUS) test strip Check blood sugar once a day and as directed. Dx E11.40  . Ibuprofen 200 MG CAPS 600 MG  3 TIMES A DAY FOR 3 DAYS, 400 MG 3 TIMES A DAY FOR 3 DAYS , 200 MG  3 TIMES A DAY THEN STOP  . Lancet Devices (ACCU-CHEK SOFTCLIX) lancets Check blood sugar once a day and as directed. Dx E11.40, Normal  . levothyroxine (SYNTHROID, LEVOTHROID) 50 MCG tablet Take 1 tablet by mouth  daily  . loratadine (CLARITIN) 10 MG tablet Take 10 mg by mouth daily.  . magnesium gluconate (MAGONATE) 500 MG tablet Take 1,000 mg by mouth at bedtime.   . methocarbamol (ROBAXIN) 500 MG tablet TAKE ONE (1) TABLET BY MOUTH EVERY EIGHTHOURS AS NEEDED FOR MUSCLE SPASMS  . mirtazapine (REMERON) 15 MG tablet Take 1 tablet by mouth at  bedtime  . morphine (MS CONTIN) 15 MG 12 hr tablet Take 1 tablet (15 mg total) by mouth 3 (three) times daily.  . mupirocin ointment (BACTROBAN) 2 % PLACE 1 APPLICATION INTO THE NOSE 2 TIMES DAILY FOR 1 WEEK  . oxybutynin (DITROPAN-XL) 10 MG 24 hr tablet TAKE ONE TABLET BY MOUTH EVERY NIGHT AT BEDTIME  . RABEprazole  (ACIPHEX) 20 MG tablet Take 1 tablet (20 mg total) by mouth 2 (two) times daily.  Marland Kitchen senna-docusate (PERI-COLACE) 8.6-50 MG per tablet Take 4 tablets by mouth at bedtime as needed for mild constipation.    No current facility-administered medications on file prior to visit.     Chief Complaint  Patient presents with  . O2 follow up    annual follow up for O2, PT. still uses her 02 at night, running on 2L, Pt. states her breathing is doing well    Sleep tests PSG 02/26/15 >> AHI 4.4, SaO2 low 83%. Spent 23.68mn with SpO2 <88%. ONO with RA 03/11/15 >> test time 8 hrs 50 min. Baseline SpO2 92%, low SpO2 78%. Spent 24 min with SpO2 < 88%.  Cardiac tests Echo 12/28/07 >> EF 60 to 65%, mild AR  Past medical history B12 deficiency, Depression, Fibromyalgia, GERD, IBS, HTN, HLD, Hypothyroidism, Migraine HA, DM  Past surgical history, Family history, Social history, Allergies reviewed  Vital signs BP 112/62 (BP Location: Right Arm, Patient Position: Sitting, Cuff Size: Normal)   Pulse 64   Ht 5' 6"  (1.676 m)   Wt 223 lb 6.4 oz (101.3 kg)   SpO2 97%   BMI 36.06 kg/m  History of Present Illness: [name] is a 65 y.o. 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  She has been doing well since last visit except for issues with reflux.  She has EGD scheduled for later this month.  She gets about 6 to 8 hrs sleep per night.  Not having snoring, sleep disruption, nocturia, or morning headaches.  Denies cough, wheeze, dyspnea on exertion, chest pain, or leg swelling.  She does not get flu shots >> had terrible reaction in 1970's from flu shot.  She did get pneumovax several years ago.  Physical Exam:  General - No distress ENT - MP 3, scalloped tongue Cardiac - s1s2 regular Chest - No wheeze/rales/dullness Back - No focal tenderness Abd - Soft, non-tender Ext - No edema Neuro - Normal strength Skin - No rashes Psych -  Normal mood, and behavior   CMP Latest Ref Rng & Units 04/18/2016 10/20/2014 01/13/2014  Glucose 70 - 99 mg/dL 115(H) 133(H) 91  BUN 6 - 23 mg/dL 12 16 21   Creatinine 0.40 - 1.20 mg/dL 0.75 0.99 0.8  Sodium 135 - 145 mEq/L 141 137 139  Potassium 3.5 - 5.1 mEq/L 4.2 4.1 4.2  Chloride 96 - 112 mEq/L 102 102 101  CO2 19 - 32 mEq/L 34(H) 32 29  Calcium 8.4 - 10.5 mg/dL 9.7 9.8 9.2  Total Protein 6.0 - 8.3 g/dL 6.8 - 7.1  Total Bilirubin 0.2 - 1.2 mg/dL 0.3 - 0.6  Alkaline Phos 39 - 117 U/L 98 - 88  AST 0 - 37 U/L 33 - 27  ALT 0 - 35 U/L 36(H) - 26    CBC Latest Ref Rng & Units 04/18/2016 01/13/2014 04/03/2013  WBC 4.0 - 10.5 K/uL 7.1 8.3 7.2  Hemoglobin 12.0 - 15.0 g/dL 12.6 12.3 12.7  Hematocrit 36.0 - 46.0 % 37.3 36.5 37.4  Platelets 150.0 - 400.0 K/uL 258.0 257.0 274.0    Assessment/plan  Chronic respiratory failure with hypoxia. 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 - discussed options to assist with weight loss   Patient Instructions  Follow up in 1 year    Chesley Mires, MD [place] 05/02/2016, 12:19 PM Pager:  [phone]

## 2016-05-02 NOTE — Patient Instructions (Addendum)
Please go to live our [facility] to get the x-rays of your thoracic spine. If there is any significant problems will call you otherwise. You can discuss results with Zella Ball next visit  Please start doing rowing exercises with the theraband once again

## 2016-05-02 NOTE — Patient Instructions (Signed)
Follow up in 1 year.

## 2016-05-02 NOTE — Telephone Encounter (Signed)
Terri, can you help set up x-ray appt for patient?

## 2016-05-02 NOTE — Telephone Encounter (Signed)
Received fax from [facility] requesting PA for Rabeprazole. PA completed on CoverMyMeds.  PA went for review. Awaiting outcome.

## 2016-05-03 NOTE — Telephone Encounter (Signed)
Raberprazole approved through 06/26/2017.  [facility] notified via fax.

## 2016-05-03 NOTE — Telephone Encounter (Signed)
Spoke to the patient, coming in 05-04-16 for x ray

## 2016-05-04 ENCOUNTER — Ambulatory Visit (INDEPENDENT_AMBULATORY_CARE_PROVIDER_SITE_OTHER)
Admission: RE | Admit: 2016-05-04 | Discharge: 2016-05-04 | Disposition: A | Discharge status: Home / Self Care | Payer: Medicare Other | Source: Ambulatory Visit | Attending: Physical Medicine & Rehabilitation | Admitting: Physical Medicine & Rehabilitation

## 2016-05-04 ENCOUNTER — Encounter: Payer: Medicare Other | Admitting: Gastroenterology

## 2016-05-04 DIAGNOSIS — M40204 Unspecified kyphosis, thoracic region: Secondary | ICD-10-CM

## 2016-05-04 DIAGNOSIS — M546 Pain in thoracic spine: Secondary | ICD-10-CM | POA: Diagnosis not present

## 2016-05-09 ENCOUNTER — Encounter (HOSPITAL_COMMUNITY): Payer: Self-pay | Admitting: *Deleted

## 2016-05-17 ENCOUNTER — Ambulatory Visit (HOSPITAL_COMMUNITY): Payer: Medicare Other | Admitting: Registered Nurse

## 2016-05-17 ENCOUNTER — Encounter (HOSPITAL_COMMUNITY)
Admission: RE | Disposition: A | Discharge status: Home / Self Care | Payer: Self-pay | Source: Ambulatory Visit | Attending: Gastroenterology

## 2016-05-17 ENCOUNTER — Encounter (HOSPITAL_COMMUNITY): Payer: Self-pay

## 2016-05-17 ENCOUNTER — Ambulatory Visit (HOSPITAL_COMMUNITY)
Admission: RE | Admit: 2016-05-17 | Discharge: 2016-05-17 | Disposition: A | Discharge status: Home / Self Care | Payer: Medicare Other | Source: Ambulatory Visit | Attending: Gastroenterology | Admitting: Gastroenterology

## 2016-05-17 DIAGNOSIS — Z880 Allergy status to penicillin: Secondary | ICD-10-CM | POA: Diagnosis not present

## 2016-05-17 DIAGNOSIS — F329 Major depressive disorder, single episode, unspecified: Secondary | ICD-10-CM | POA: Insufficient documentation

## 2016-05-17 DIAGNOSIS — K228 Other specified diseases of esophagus: Secondary | ICD-10-CM | POA: Diagnosis not present

## 2016-05-17 DIAGNOSIS — E538 Deficiency of other specified B group vitamins: Secondary | ICD-10-CM | POA: Diagnosis not present

## 2016-05-17 DIAGNOSIS — Z79899 Other long term (current) drug therapy: Secondary | ICD-10-CM | POA: Diagnosis not present

## 2016-05-17 DIAGNOSIS — Z7982 Long term (current) use of aspirin: Secondary | ICD-10-CM | POA: Insufficient documentation

## 2016-05-17 DIAGNOSIS — I10 Essential (primary) hypertension: Secondary | ICD-10-CM | POA: Diagnosis not present

## 2016-05-17 DIAGNOSIS — M797 Fibromyalgia: Secondary | ICD-10-CM | POA: Diagnosis not present

## 2016-05-17 DIAGNOSIS — R1310 Dysphagia, unspecified: Secondary | ICD-10-CM | POA: Diagnosis not present

## 2016-05-17 DIAGNOSIS — E1140 Type 2 diabetes mellitus with diabetic neuropathy, unspecified: Secondary | ICD-10-CM | POA: Insufficient documentation

## 2016-05-17 DIAGNOSIS — Z800 Family history of malignant neoplasm of digestive organs: Secondary | ICD-10-CM | POA: Insufficient documentation

## 2016-05-17 DIAGNOSIS — K219 Gastro-esophageal reflux disease without esophagitis: Secondary | ICD-10-CM | POA: Insufficient documentation

## 2016-05-17 DIAGNOSIS — Z833 Family history of diabetes mellitus: Secondary | ICD-10-CM | POA: Insufficient documentation

## 2016-05-17 DIAGNOSIS — K319 Disease of stomach and duodenum, unspecified: Secondary | ICD-10-CM | POA: Diagnosis not present

## 2016-05-17 DIAGNOSIS — K589 Irritable bowel syndrome without diarrhea: Secondary | ICD-10-CM | POA: Insufficient documentation

## 2016-05-17 DIAGNOSIS — Z90710 Acquired absence of both cervix and uterus: Secondary | ICD-10-CM | POA: Insufficient documentation

## 2016-05-17 DIAGNOSIS — R61 Generalized hyperhidrosis: Secondary | ICD-10-CM | POA: Diagnosis not present

## 2016-05-17 DIAGNOSIS — E039 Hypothyroidism, unspecified: Secondary | ICD-10-CM | POA: Diagnosis not present

## 2016-05-17 DIAGNOSIS — Z803 Family history of malignant neoplasm of breast: Secondary | ICD-10-CM | POA: Insufficient documentation

## 2016-05-17 DIAGNOSIS — G43909 Migraine, unspecified, not intractable, without status migrainosus: Secondary | ICD-10-CM | POA: Insufficient documentation

## 2016-05-17 DIAGNOSIS — R12 Heartburn: Secondary | ICD-10-CM

## 2016-05-17 DIAGNOSIS — E785 Hyperlipidemia, unspecified: Secondary | ICD-10-CM | POA: Insufficient documentation

## 2016-05-17 DIAGNOSIS — Z9049 Acquired absence of other specified parts of digestive tract: Secondary | ICD-10-CM | POA: Insufficient documentation

## 2016-05-17 DIAGNOSIS — K3189 Other diseases of stomach and duodenum: Secondary | ICD-10-CM | POA: Diagnosis not present

## 2016-05-17 DIAGNOSIS — Z981 Arthrodesis status: Secondary | ICD-10-CM | POA: Diagnosis not present

## 2016-05-17 DIAGNOSIS — Z886 Allergy status to analgesic agent status: Secondary | ICD-10-CM | POA: Insufficient documentation

## 2016-05-17 DIAGNOSIS — Z825 Family history of asthma and other chronic lower respiratory diseases: Secondary | ICD-10-CM | POA: Insufficient documentation

## 2016-05-17 DIAGNOSIS — Z8249 Family history of ischemic heart disease and other diseases of the circulatory system: Secondary | ICD-10-CM | POA: Insufficient documentation

## 2016-05-17 DIAGNOSIS — Z881 Allergy status to other antibiotic agents status: Secondary | ICD-10-CM | POA: Insufficient documentation

## 2016-05-17 DIAGNOSIS — Z888 Allergy status to other drugs, medicaments and biological substances status: Secondary | ICD-10-CM | POA: Insufficient documentation

## 2016-05-17 HISTORY — DX: Dependence on supplemental oxygen: Z99.81

## 2016-05-17 HISTORY — DX: Cardiac murmur, unspecified: R01.1

## 2016-05-17 HISTORY — DX: Other specified postprocedural states: Z98.890

## 2016-05-17 HISTORY — PX: SAVORY DILATION: SHX5439

## 2016-05-17 HISTORY — DX: Type 2 diabetes mellitus without complications: E11.9

## 2016-05-17 HISTORY — DX: Adverse effect of unspecified anesthetic, initial encounter: T41.45XA

## 2016-05-17 HISTORY — DX: Unspecified osteoarthritis, unspecified site: M19.90

## 2016-05-17 HISTORY — DX: Other complications of anesthesia, initial encounter: T88.59XA

## 2016-05-17 HISTORY — DX: Ventricular premature depolarization: I49.3

## 2016-05-17 HISTORY — DX: Nausea with vomiting, unspecified: R11.2

## 2016-05-17 HISTORY — PX: ESOPHAGOGASTRODUODENOSCOPY (EGD) WITH PROPOFOL: SHX5813

## 2016-05-17 SURGERY — ESOPHAGOGASTRODUODENOSCOPY (EGD) WITH PROPOFOL
Anesthesia: General

## 2016-05-17 MED ORDER — PROPOFOL 500 MG/50ML IV EMUL
INTRAVENOUS | Status: DC | PRN
  Administered 2016-05-17: 300 ug/kg/min via INTRAVENOUS

## 2016-05-17 MED ORDER — ONDANSETRON HCL 4 MG/2ML IJ SOLN
INTRAMUSCULAR | Status: AC
  Filled 2016-05-17: qty 2

## 2016-05-17 MED ORDER — PROPOFOL 10 MG/ML IV BOLUS
INTRAVENOUS | Status: AC
  Filled 2016-05-17: qty 20

## 2016-05-17 MED ORDER — SODIUM CHLORIDE 0.9 % IV SOLN: INTRAVENOUS | Status: DC

## 2016-05-17 MED ORDER — PROPOFOL 10 MG/ML IV BOLUS
INTRAVENOUS | Status: AC
  Filled 2016-05-17: qty 60

## 2016-05-17 MED ORDER — ONDANSETRON HCL 4 MG/2ML IJ SOLN
INTRAMUSCULAR | Status: DC | PRN
  Administered 2016-05-17: 4 mg via INTRAVENOUS

## 2016-05-17 MED ORDER — DEXAMETHASONE SODIUM PHOSPHATE 10 MG/ML IJ SOLN
INTRAMUSCULAR | Status: AC
  Filled 2016-05-17: qty 1

## 2016-05-17 MED ORDER — LACTATED RINGERS IV SOLN
INTRAVENOUS | Status: DC
  Administered 2016-05-17: 14:00:00 via INTRAVENOUS

## 2016-05-17 SURGICAL SUPPLY — 14 items

## 2016-05-17 NOTE — Interval H&P Note (Signed)
History and Physical Interval Note:  05/17/2016 1:56 PM  [name]  has presented today for surgery, with the diagnosis of GERD, Dysphagia  The various methods of treatment have been discussed with the patient and family. After consideration of risks, benefits and other options for treatment, the patient has consented to  Procedure(s): ESOPHAGOGASTRODUODENOSCOPY (EGD) WITH PROPOFOL (N/A) SAVORY DILATION (N/A) as a surgical intervention .  The patient's history has been reviewed, patient examined, no change in status, stable for surgery.  I have reviewed the patient's chart and labs.  Questions were answered to the patient's satisfaction.     Renelda Loma Armbruster

## 2016-05-17 NOTE — H&P (View-Only) (Signed)
HPI :  65 year old female with long-standing symptoms of reflux and dysphagia, here for a follow-up visit.   Since her last visit with [place] she's had a barium swallow, esophageal manometry, and 24-hour pH impedance study -these results are as outlined below. Her DeMeester score was 24 on the pH study and she was switched to Dexilant 60 mg a day for month which did not help her symptoms.   She reports she has since stopped all PPIs and has not been having some discomfort in her chest which has been bothering her lately. She has been seen by her cardiologist and they did not think her symptoms were cardiac related. She states it comes and goes, wakes her up at night or early AM. Sometimes it lasts for days at a time, sometimes not at all. She was given course of steroid pack and ibuprofen for possible costochondritis which did not help. She denies any tenderness to the chest wall. She is not sure if symptoms are worsened with eating, sometimes worse if she hasn't eaten. She has had symptoms of pyrosis / regurgitatoin as her main reflux symptoms. She is taking rolaids PRN for reflux, she is not taking anything routinely. She has taken some zegerid OTC which she thinks helps some when she takes it. She reports dysphagia frequently which bothers her. She is not vomiting. She is eating okay otherwise, doesn't endorse early satiety. She has been off PPIs since August. She states she has been on every PPI that exists. Of all PPIs she reports AcipHex has worked best for her.  Prior workup: Barium swallow 11/2015 - mild to moderate dysmotility, small hiatal hernia 24 HR pH  impedance study - Demeester score of 24.4, symptom index 33%.  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 B12 deficiency   .  Depression   . Fibromyalgia   . Generalized hyperhidrosis 04/17/2014  . GERD (gastroesophageal reflux disease)   . Hyperlipemia   . HYPOTHYROIDISM 05/29/2007  . Irritable bowel syndrome   . Migraines   . Type II or unspecified type diabetes mellitus with neurological manifestations, not stated as uncontrolled(250.60) 04/07/2013  . Unspecified essential hypertension      Past Surgical History:  Procedure [place]ality Date  . [place]   Procedure: [place];  Surgeon: SManus Gunning MD;  Location: [facility];  Service: Gastroenterology;  Laterality: N/A;  . ABDOMINAL HYSTERECTOMY  1988  . ANTERIOR CERVICAL DECOMP/DISCECTOMY FUSION  2009, 1993   first procedure in ~ 1993/Dr Botero  . APPENDECTOMY  1988  . CHOLECYSTECTOMY  2005  . ESOPHAGEAL MANOMETRY N/A 01/04/2016   Procedure: ESOPHAGEAL MANOMETRY (EM) 24 HR PH;  Surgeon: SManus Gunning MD;  Location: [facility];  Service: Gastroenterology;  Laterality: N/A;  . NASAL SINUS SURGERY  2006  . TONSILLECTOMY AND ADENOIDECTOMY    . TRANSTHORACIC ECHOCARDIOGRAM  2009   Normal LV size and function. EF 60-65%. No regional wall motion and amount is. Mild aortic sclerosis, no stenosis  . TUBAL LIGATION  1979   Family History  Problem Relation Age of Onset  . Breast cancer Mother   . Diabetes Mother   . Heart disease Mother   . Allergies Mother   . Diabetes Father   . Heart disease Father   .  Emphysema Father   . Allergies Father   . Stomach cancer Maternal Uncle   . Breast cancer Paternal Aunt   . Colon cancer Neg Hx    Social History  Substance Use Topics  . Smoking status: Never Smoker  . Smokeless tobacco: Never Used  . Alcohol use No   Current Outpatient Prescriptions  Medication Sig Dispense Refill  . aspirin 81 MG tablet Take 81 mg by mouth daily.      . B Complex Vitamins (B COMPLEX 100 PO) Take 1 tablet by mouth daily.    . Blood Glucose Monitoring Suppl (ACCU-CHEK [name]  PLUS) W/DEVICE KIT Check blood sugar once a day and as directed. Dx E11.40 1 kit 0  . Calcium Carbonate (CALCIUM 600 PO) Take 600 mg by mouth 2 (two) times daily.     . Cholecalciferol (VITAMIN D) 2000 UNITS tablet Take 2,000 Units by mouth daily.      . citalopram (CELEXA) 40 MG tablet Take 1 tablet by mouth  daily 90 tablet 1  . clindamycin (CLEOCIN) 300 MG capsule Take 600 mg by mouth. before dental appointments    . Coenzyme Q-10 100 MG capsule Take 100 mg by mouth daily.    . diclofenac sodium (VOLTAREN) 1 % GEL Apply 4 g topically 4 (four) times daily. 5 Tube prn  . docusate sodium (COLACE) 100 MG capsule Take 200 mg by mouth at bedtime.     . gabapentin (NEURONTIN) 600 MG tablet Take 1 tablet by mouth 3  times daily 270 tablet 0  . glucose blood (ACCU-CHEK [name] PLUS) test strip Check blood sugar once a day and as directed. Dx E11.40 100 each 3  . Ibuprofen 200 MG CAPS 600 MG  3 TIMES A DAY FOR 3 DAYS, 400 MG 3 TIMES A DAY FOR 3 DAYS , 200 MG  3 TIMES A DAY THEN STOP 120 each 0  . Lancet Devices (ACCU-CHEK SOFTCLIX) lancets Check blood sugar once a day and as directed. Dx E11.40, Normal 1 each 0  . levothyroxine (SYNTHROID, LEVOTHROID) 50 MCG tablet Take 1 tablet by mouth  daily 90 tablet 1  . loratadine (CLARITIN) 10 MG tablet Take 10 mg by mouth daily.    . magnesium gluconate (MAGONATE) 500 MG tablet Take 1,000 mg by mouth at bedtime.     . methocarbamol (ROBAXIN) 500 MG tablet TAKE ONE (1) TABLET BY MOUTH EVERY EIGHTHOURS AS NEEDED FOR MUSCLE SPASMS 180 tablet 3  . mirtazapine (REMERON) 15 MG tablet Take 1 tablet by mouth at  bedtime 90 tablet 1  . morphine (MS CONTIN) 15 MG 12 hr tablet Take 1 tablet (15 mg total) by mouth 3 (three) times daily. 90 tablet 0  . mupirocin ointment (BACTROBAN) 2 % PLACE 1 APPLICATION INTO THE NOSE 2 TIMES DAILY FOR 1 WEEK 22 g 0  . oxybutynin (DITROPAN-XL) 10 MG 24 hr tablet TAKE ONE TABLET BY MOUTH EVERY NIGHT AT BEDTIME 90 tablet 3  . senna-docusate  (PERI-COLACE) 8.6-50 MG per tablet Take 4 tablets by mouth at bedtime as needed for mild constipation.      No current facility-administered medications for this visit.    Allergies  Allergen Reactions  . Rizatriptan Benzoate Anaphylaxis    Throat and Tongue Swelling Closed  . Sumatriptan Other (See Comments)    Throat closed & Tongue Swelling  . Tizanidine Shortness Of Breath  . Aleve [Naproxen Sodium] Hives  . Clarithromycin   . Cyclobenzaprine Hcl     REACTION:  THROAT CLOSED,TOUNGE SWELLING  . Doxycycline   . Duloxetine   . Keflex [Cephalexin] Itching    Sores  . Metronidazole   . Oxycodone Hcl Diarrhea    Vomiting  . Penicillins     TOLERATES CEPHALOSPORINS  . Rosuvastatin     REACTION: MUSCLE ACHES  . Septra [Sulfamethoxazole-Trimethoprim] Other (See Comments)    Rash and itching  . Tramadol   . Venlafaxine Hives  . Voltaren [Diclofenac Sodium] Hives    oral     Review of Systems: All systems reviewed and negative except where noted in HPI.    Lab Results  Component Value Date   WBC 7.1 04/18/2016   HGB 12.6 04/18/2016   HCT 37.3 04/18/2016   MCV 89.6 04/18/2016   PLT 258.0 04/18/2016    Lab Results  Component Value Date   ALT 36 (H) 04/18/2016   AST 33 04/18/2016   ALKPHOS 98 04/18/2016   BILITOT 0.3 04/18/2016    Lab Results  Component Value Date   CREATININE 0.75 04/18/2016   BUN 12 04/18/2016   NA 141 04/18/2016   K 4.2 04/18/2016   CL 102 04/18/2016   CO2 34 (H) 04/18/2016     Physical Exam: BP 128/78   Pulse 72   Ht 5' 6.25" (1.683 m)   Wt 223 lb 4 oz (101.3 kg)   BMI 35.76 kg/m  Constitutional: Pleasant,well-developed, female in no acute distress. HEENT: Normocephalic and atraumatic. Conjunctivae are normal. No scleral icterus. Neck supple.  Cardiovascular: Normal rate, regular rhythm.  Pulmonary/chest: Effort normal and breath sounds normal. No wheezing, rales or rhonchi. Abdominal: Soft, nondistended, protuberant, nontender.  There are no masses palpable. No hepatomegaly. Extremities: no edema Lymphadenopathy: No cervical adenopathy noted. Neurological: Alert and oriented to person place and time. Skin: Skin is warm and dry. No rashes noted. Psychiatric: Normal mood and affect. Behavior is normal.   ASSESSMENT AND PLAN: 65 year old female with long-standing GERD and dysphagia with an extensive evaluation as outlined above. Her 24 hour pH impedance showed a DeMeester in the 20s while on PPI, she clearly has breakthrough reflux although interestingly during that exam her symptom index was below 50%. She has since stopped all PPIs and more recently has been having intermittent chest discomfort and worsening reflux symptoms, which I suspect are related. She may likely have esophagitis off PPI. She's been seen by cardiology who did not think this was cardiac in nature.  I discussed options with her moving forward. She reports feeling the best when she was taking AcipHex previously, we'll put her on 20 mg twice a day, and also add Zantac 150 twice a day in hopes we can improve her symptoms. She can also use Gaviscon when necessary for breakthrough. It also been 5 years since her last upper endoscopy in her dysphagia has been persistent, without a clear etiology on manometry and barium study, so I offered her an upper endoscopy with empiric dilation to see if this can provide any benefit. Pending her course, if symptoms persist despite maximal medical therapy we may consider a surgical evaluation, although this has potential to make her dysphagia worse and we will need to discuss risks benefits with her.  She agrees with the plan, all questions answered.  Vienna Cellar, MD [facility] Pager [phone]

## 2016-05-17 NOTE — Anesthesia Preprocedure Evaluation (Signed)
Anesthesia Evaluation  Patient identified by MRN, date of birth, ID band Patient awake    Reviewed: Allergy & Precautions, NPO status , Patient's Chart, lab work & pertinent test results  Airway Mallampati: II  TM Distance: <3 FB Neck ROM: Full    Dental no notable dental hx.    Pulmonary  Uses O2 at night   Pulmonary exam normal breath sounds clear to auscultation       Cardiovascular hypertension, Normal cardiovascular exam Rhythm:Regular Rate:Normal     Neuro/Psych Chronic MSO4 use negative psych ROS   GI/Hepatic negative GI ROS, Neg liver ROS,   Endo/Other  Hypothyroidism   Renal/GU negative Renal ROS  negative genitourinary   Musculoskeletal negative musculoskeletal ROS (+)   Abdominal   Peds negative pediatric ROS (+)  Hematology negative hematology ROS (+)   Anesthesia Other Findings   Reproductive/Obstetrics negative OB ROS                             Anesthesia Physical Anesthesia Plan  ASA: III  Anesthesia Plan: General   Post-op Pain Management:    Induction: Intravenous  Airway Management Planned: Nasal Cannula  Additional Equipment:   Intra-op Plan:   Post-operative Plan:   Informed Consent: I have reviewed the patients History and Physical, chart, labs and discussed the procedure including the risks, benefits and alternatives for the proposed anesthesia with the patient or authorized representative who has indicated his/her understanding and acceptance.   Dental advisory given  Plan Discussed with: CRNA and Surgeon  Anesthesia Plan Comments:         Anesthesia Quick Evaluation

## 2016-05-17 NOTE — Discharge Instructions (Signed)
YOU HAD AN ENDOSCOPIC PROCEDURE TODAY: Refer to the procedure report and other information in the discharge instructions given to you for any specific questions about what was found during the examination. If this information does not answer your questions, please call [place] office at [phone] to clarify.   YOU SHOULD EXPECT: Some feelings of bloating in the abdomen. Passage of more gas than usual. Walking can help get rid of the air that was put into your GI tract during the procedure and reduce the bloating. If you had a lower endoscopy (such as a colonoscopy or flexible sigmoidoscopy) you may notice spotting of blood in your stool or on the toilet paper. Some abdominal soreness may be present for a day or two, also.  DIET: Your first meal following the procedure should be a light meal and then it is ok to progress to your normal diet. A half-sandwich or bowl of soup is an example of a good first meal. Heavy or fried foods are harder to digest and may make you feel nauseous or bloated. Drink plenty of fluids but you should avoid alcoholic beverages for 24 hours. If you had a esophageal dilation, please see attached instructions for diet.    ACTIVITY: Your care partner should take you home directly after the procedure. You should plan to take it easy, moving slowly for the rest of the day. You can resume normal activity the day after the procedure however YOU SHOULD NOT DRIVE, use power tools, machinery or perform tasks that involve climbing or major physical exertion for 24 hours (because of the sedation medicines used during the test).   SYMPTOMS TO REPORT IMMEDIATELY: A gastroenterologist can be reached at any hour. Please call [phone]  for any of the following symptoms:  Following lower endoscopy (colonoscopy, flexible sigmoidoscopy) Excessive amounts of blood in the stool  Significant tenderness, worsening of abdominal pains  Swelling of the abdomen that is new, acute  Fever of 100 or  higher  Following upper endoscopy (EGD, EUS, ERCP, esophageal dilation) Vomiting of blood or coffee ground material  New, significant abdominal pain  New, significant chest pain or pain under the shoulder blades  Painful or persistently difficult swallowing  New shortness of breath  Black, tarry-looking or red, bloody stools  FOLLOW UP:  If any biopsies were taken you will be contacted by phone or by letter within the next 1-3 weeks. Call [phone]  if you have not heard about the biopsies in 3 weeks.  Please also call with any specific questions about appointments or follow up tests.YOU HAD AN ENDOSCOPIC PROCEDURE TODAY: Refer to the procedure report and other information in the discharge instructions given to you for any specific questions about what was found during the examination. If this information does not answer your questions, please call [place] [facility] [place] office at [phone] to clarify.   YOU SHOULD EXPECT: Some feelings of bloating in the abdomen. Passage of more gas than usual. Walking can help get rid of the air that was put into your GI tract during the procedure and reduce the bloating. If you had a lower endoscopy (such as a colonoscopy or flexible sigmoidoscopy) you may notice spotting of blood in your stool or on the toilet paper. Some abdominal soreness may be present for a day or two, also.  DIET: Your first meal following the procedure should be a light meal and then it is ok to progress to your normal diet. A half-sandwich or bowl of soup is an example of a   good first meal. Heavy or fried foods are harder to digest and may make you feel nauseous or bloated. Drink plenty of fluids but you should avoid alcoholic beverages for 24 hours. If you had a esophageal dilation, please see attached instructions for diet.    ACTIVITY: Your care partner should take you home directly after the procedure. You should plan to take it easy, moving slowly for the rest of the day. You can resume  normal activity the day after the procedure however YOU SHOULD NOT DRIVE, use power tools, machinery or perform tasks that involve climbing or major physical exertion for 24 hours (because of the sedation medicines used during the test).   SYMPTOMS TO REPORT IMMEDIATELY: A gastroenterologist can be reached at any hour. Please call [phone]  for any of the following symptoms:  Following lower endoscopy (colonoscopy, flexible sigmoidoscopy) Excessive amounts of blood in the stool  Significant tenderness, worsening of abdominal pains  Swelling of the abdomen that is new, acute  Fever of 100 or higher  Following upper endoscopy (EGD, EUS, ERCP, esophageal dilation) Vomiting of blood or coffee ground material  New, significant abdominal pain  New, significant chest pain or pain under the shoulder blades  Painful or persistently difficult swallowing  New shortness of breath  Black, tarry-looking or red, bloody stools  FOLLOW UP:  If any biopsies were taken you will be contacted by phone or by letter within the next 1-3 weeks. Call [phone]  if you have not heard about the biopsies in 3 weeks.  Please also call with any specific questions about appointments or follow up tests. 

## 2016-05-17 NOTE — Anesthesia Postprocedure Evaluation (Deleted)
Anesthesia Post Note  Patient: ERISHA MARTINELL  Procedure(s) Performed: Procedure(s) (LRB): ESOPHAGOGASTRODUODENOSCOPY (EGD) WITH PROPOFOL (N/A) SAVORY DILATION (N/A)  Patient location during evaluation: PACU Anesthesia Type: MAC Level of consciousness: awake and alert Pain management: pain level controlled Vital Signs Assessment: post-procedure vital signs reviewed and stable Respiratory status: spontaneous breathing, nonlabored ventilation, respiratory function stable and patient connected to nasal cannula oxygen Cardiovascular status: stable and blood pressure returned to baseline Anesthetic complications: no    Last Vitals:  Vitals:   05/17/16 1313 05/17/16 1431  BP: (!) 135/56 (!) 102/38  Pulse: 65 74  Resp: 11 (!) 8  Temp: 37.2 C 36.6 C    Last Pain:  Vitals:   05/17/16 1431  TempSrc: Oral                 Haroon Shatto S

## 2016-05-17 NOTE — Anesthesia Postprocedure Evaluation (Signed)
Anesthesia Post Note  Patient: [name]  Procedure(s) Performed: Procedure(s) (LRB): ESOPHAGOGASTRODUODENOSCOPY (EGD) WITH PROPOFOL (N/A) SAVORY DILATION (N/A)  Patient location during evaluation: PACU Anesthesia Type: MAC Level of consciousness: awake and alert Pain management: pain level controlled Vital Signs Assessment: post-procedure vital signs reviewed and stable Respiratory status: spontaneous breathing, nonlabored ventilation, respiratory function stable and patient connected to nasal cannula oxygen Cardiovascular status: stable and blood pressure returned to baseline Anesthetic complications: no    Last Vitals:  Vitals:   05/17/16 1431 05/17/16 1440  BP: (!) 102/38 (!) 105/43  Pulse: 74 70  Resp: (!) 8 16  Temp: 36.6 C     Last Pain:  Vitals:   05/17/16 1431  TempSrc: Oral                 Audia Amick S

## 2016-05-17 NOTE — Transfer of Care (Signed)
Immediate Anesthesia Transfer of Care Note  Patient: [name]  Procedure(s) Performed: Procedure(s): ESOPHAGOGASTRODUODENOSCOPY (EGD) WITH PROPOFOL (N/A) SAVORY DILATION (N/A)  Patient Location: PACU  Anesthesia Type:MAC  Level of Consciousness: awake, alert , oriented and patient cooperative  Airway & Oxygen Therapy: Patient Spontanous Breathing and Patient connected to nasal cannula oxygen  Post-op Assessment: Report given to RN, Post -op Vital signs reviewed and stable and Patient moving all extremities X 4  Post vital signs: stable  Last Vitals:  Vitals:   05/17/16 1431 05/17/16 1440  BP: (!) 102/38 (!) 105/43  Pulse: 74 70  Resp: (!) 8 16  Temp: 36.6 C     Last Pain:  Vitals:   05/17/16 1431  TempSrc: Oral         Complications: No apparent anesthesia complications

## 2016-05-17 NOTE — Anesthesia Procedure Notes (Signed)
Performed by: Olanda Downie E       

## 2016-05-17 NOTE — Op Note (Signed)
[facility] Patient Name: [name] Procedure Date: 05/17/2016 MRN: [id number] Attending MD: Carlota Raspberry. Armbruster MD, MD Date of Birth: 10/08/1950 CSN: [id number] Age: 65 Admit Type: Outpatient Procedure:                Upper GI endoscopy Indications:              Dysphagia, Heartburn, improved on aciphex, prior                            manometry and barium study without clear etiology Providers:                Carlota Raspberry. Armbruster MD, MD, Hilma Favors, RN,                            Elspeth Cho Tech., Technician, Enrigue Catena, CRNA Referring MD:              Medicines:                Monitored Anesthesia Care Complications:            No immediate complications. Estimated blood loss:                            Minimal. Estimated Blood Loss:     Estimated blood loss was minimal. Procedure:                Pre-Anesthesia Assessment:                           - Prior to the procedure, a History and Physical                            was performed, and patient medications and                            allergies were reviewed. The patient's tolerance of                            previous anesthesia was also reviewed. The risks                            and benefits of the procedure and the sedation                            options and risks were discussed with the patient.                            All questions were answered, and informed consent                            was obtained. Prior Anticoagulants: The patient has                            taken no previous anticoagulant or antiplatelet  agents. ASA Grade Assessment: III - A patient with                            severe systemic disease. After reviewing the risks                            and benefits, the patient was deemed in                            satisfactory condition to undergo the procedure.                           After obtaining informed consent, the  endoscope was                            passed under direct vision. Throughout the                            procedure, the patient's blood pressure, pulse, and                            oxygen saturations were monitored continuously. The                            was introduced through the mouth, and advanced to                            the second part of duodenum. The upper GI endoscopy                            was accomplished without difficulty. The patient                            tolerated the procedure well. Scope In: Scope Out: Findings:      Esophagogastric landmarks were identified: the Z-line was found at 40       cm, the gastroesophageal junction was found at 40 cm and the upper       extent of the gastric folds was found at 40 cm from the incisors.      The Z-line was irregular and was found 40 cm from the incisors, with a       roughly 1cm tongue of salmon colored mucosa without nodularity. Biopsies       were taken with a cold forceps for histology.      The exam of the esophagus was otherwise normal.      Biopsies were taken with a cold forceps in the upper third of the       esophagus, in the middle third of the esophagus and in the lower third       of the esophagus for histology to rule out EoE.      A guidewire was placed and the scope was withdrawn. Dilation was       performed in the entire esophagus with a Savary dilator with no       resistance at 17 mm and 18 mm. Post dilation inspection of  the esophagus       was normal.      Diffuse mildly erythematous mucosa without bleeding or ulceration was       found in the entire examined stomach. Biopsies were taken with a cold       forceps for Helicobacter pylori testing.      The exam of the stomach was otherwise normal.      The duodenal bulb and second portion of the duodenum were normal. Impression:               - Esophagogastric landmarks identified.                           - Z-line irregular, 40 cm  from the incisors.                            Biopsied.                           - Erythematous mucosa in the stomach. Biopsied.                           - Normal duodenal bulb and second portion of the                            duodenum.                           - Biopsies were taken with a cold forceps for                            histology in the upper third of the esophagus, in                            the middle third of the esophagus and in the lower                            third of the esophagus to rule out EoE.                           - Dilation performed in the entire esophagus up to                            50mm without appreciable mucosal wrent. Moderate Sedation:      No moderate sedation, case performed with MAC Recommendation:           - Patient has a contact number available for                            emergencies. The signs and symptoms of potential                            delayed complications were discussed with the                            patient. Return to  normal activities tomorrow.                            Written discharge instructions were provided to the                            patient.                           - Resume previous diet.                           - Continue present medications.                           - Await pathology results.                           - Repeat upper endoscopy PRN for retreatment if                            dysphagia is improved after dilation Procedure Code(s):        --- Professional ---                           [phone], Esophagogastroduodenoscopy, flexible,                            transoral; with insertion of guide wire followed by                            passage of dilator(s) through esophagus over guide                            wire                           [id number], Esophagogastroduodenoscopy, flexible,                            transoral; with biopsy, single or multiple Diagnosis  Code(s):        --- Professional ---                           K22.8, Other specified diseases of esophagus                           K31.89, Other diseases of stomach and duodenum                           R13.10, Dysphagia, unspecified                           R12, Heartburn CPT copyright 2016 American Medical Association. All rights reserved. The codes documented in this report are preliminary and upon coder review may  be revised to meet current compliance requirements. Remo Lipps P. Armbruster MD, MD 05/17/2016 2:28:12 PM This report has been signed electronically.  Number of Addenda: 0 

## 2016-05-18 ENCOUNTER — Encounter (HOSPITAL_COMMUNITY): Payer: Self-pay | Admitting: Gastroenterology

## 2016-05-20 ENCOUNTER — Encounter: Payer: Self-pay | Admitting: Gastroenterology

## 2016-05-23 NOTE — Progress Notes (Signed)
Letter mailed

## 2016-05-24 ENCOUNTER — Other Ambulatory Visit: Payer: Self-pay | Admitting: Family Medicine

## 2016-05-24 DIAGNOSIS — J9610 Chronic respiratory failure, unspecified whether with hypoxia or hypercapnia: Secondary | ICD-10-CM | POA: Diagnosis not present

## 2016-05-24 DIAGNOSIS — Z1231 Encounter for screening mammogram for malignant neoplasm of breast: Secondary | ICD-10-CM

## 2016-05-25 ENCOUNTER — Ambulatory Visit: Payer: Medicare Other | Admitting: Cardiology

## 2016-05-27 ENCOUNTER — Encounter: Payer: Medicare Other | Attending: Physical Medicine & Rehabilitation | Admitting: Registered Nurse

## 2016-05-27 ENCOUNTER — Encounter: Payer: Self-pay | Admitting: Registered Nurse

## 2016-05-27 VITALS — BP 138/69 | HR 71

## 2016-05-27 DIAGNOSIS — M545 Low back pain, unspecified: Secondary | ICD-10-CM

## 2016-05-27 DIAGNOSIS — M25561 Pain in right knee: Secondary | ICD-10-CM | POA: Diagnosis not present

## 2016-05-27 DIAGNOSIS — M797 Fibromyalgia: Secondary | ICD-10-CM

## 2016-05-27 DIAGNOSIS — G8929 Other chronic pain: Secondary | ICD-10-CM

## 2016-05-27 DIAGNOSIS — Z79899 Other long term (current) drug therapy: Secondary | ICD-10-CM

## 2016-05-27 DIAGNOSIS — M961 Postlaminectomy syndrome, not elsewhere classified: Secondary | ICD-10-CM

## 2016-05-27 DIAGNOSIS — Z5181 Encounter for therapeutic drug level monitoring: Secondary | ICD-10-CM | POA: Diagnosis not present

## 2016-05-27 DIAGNOSIS — G894 Chronic pain syndrome: Secondary | ICD-10-CM | POA: Diagnosis not present

## 2016-05-27 DIAGNOSIS — M25562 Pain in left knee: Secondary | ICD-10-CM

## 2016-05-27 MED ORDER — MORPHINE SULFATE ER 15 MG PO TBCR: 15.0000 mg | EXTENDED_RELEASE_TABLET | Freq: Three times a day (TID) | ORAL | 0 refills | Status: DC

## 2016-05-27 NOTE — Progress Notes (Signed)
Subjective:    Patient ID: [name], female    DOB: 07/07/1950, 65 y.o.   MRN: [id number]  HPI:  [name] is a 65 year old female who returns for follow up for chronic pain and medication refill. She states her pain is located in her mid back and bilateral knee pain, she states knee pain increasing in intensity will obtain X-rays. She rates her pain 6. Her current exercise regime is walking and performing stretching exercises with bands.  Pain Inventory Average Pain 7 Pain Right Now 6 My pain is constant, burning and aching  In the last 24 hours, has pain interfered with the following? General activity 7 Relation with others 7 Enjoyment of life 7 What TIME of day is your pain at its worst? daytime Sleep (in general) Fair  Pain is worse with: walking, bending, sitting, inactivity, standing and some activites Pain improves with: rest, heat/ice, therapy/exercise, pacing activities, medication, TENS and injections Relief from Meds: 5  Mobility walk without assistance ability to climb steps?  yes do you drive?  yes Do you have any goals in this area?  yes  Function employed # of hrs/week 10 I need assistance with the following:  meal prep, household duties and shopping Do you have any goals in this area?  yes  Neuro/Psych bladder control problems weakness tremor depression  Prior Studies Any changes since last visit?  no  Physicians involved in your care Any changes since last visit?  no   Family History  Problem Relation Age of Onset  . Breast cancer Mother   . Diabetes Mother   . Heart disease Mother   . Allergies Mother   . Diabetes Father   . Heart disease Father   . Emphysema Father   . Allergies Father   . Stomach cancer Maternal Uncle   . Breast cancer Paternal Aunt   . Colon cancer Neg Hx    Social History   Social History  . Marital status: Married    Spouse name: N/A  . Number of children: N/A  . Years of education: N/A    Occupational History  . Bookkeeper    Social History Main Topics  . Smoking status: Never Smoker  . Smokeless tobacco: Never Used  . Alcohol use No  . Drug use: No  . Sexual activity: Not on file   Other Topics Concern  . Not on file   Social History Narrative  . No narrative on file   Past Surgical History:  Procedure Laterality Date  . [place] STUDY N/A 01/04/2016   Procedure: [place] STUDY;  Surgeon: Manus Gunning, MD;  Location: [facility];  Service: Gastroenterology;  Laterality: N/A;  . ABDOMINAL HYSTERECTOMY  1988  . ADENOIDECTOMY    . ANTERIOR CERVICAL DECOMP/DISCECTOMY FUSION  2009, 1993   first procedure in ~ 1993/Dr Botero  . APPENDECTOMY  1988  . BACK SURGERY    . CHOLECYSTECTOMY  2005  . ESOPHAGEAL MANOMETRY N/A 01/04/2016   Procedure: ESOPHAGEAL MANOMETRY (EM) 24 HR PH;  Surgeon: Manus Gunning, MD;  Location: [facility];  Service: Gastroenterology;  Laterality: N/A;  . ESOPHAGOGASTRODUODENOSCOPY (EGD) WITH PROPOFOL N/A 05/17/2016   Procedure: ESOPHAGOGASTRODUODENOSCOPY (EGD) WITH PROPOFOL;  Surgeon: Manus Gunning, MD;  Location: [facility];  Service: Gastroenterology;  Laterality: N/A;  . NASAL SINUS SURGERY  2006  . SAVORY DILATION N/A 05/17/2016   Procedure: SAVORY DILATION;  Surgeon: Manus Gunning, MD;  Location:  [facility];  Service: Gastroenterology;  Laterality: N/A;  . TONSILLECTOMY AND ADENOIDECTOMY    . TRANSTHORACIC ECHOCARDIOGRAM  2009   Normal LV size and function. EF 60-65%. No regional wall motion and amount is. Mild aortic sclerosis, no stenosis  . TUBAL LIGATION  1979   Past Medical History:  Diagnosis Date  . Arthritis   . B12 deficiency   . Complication of anesthesia    slow to wake up   . Depression   . Diabetes mellitus without complication ([place])    type II - diet controlled   . Fibromyalgia   . Generalized hyperhidrosis 04/17/2014  . GERD (gastroesophageal reflux disease)   .  Heart murmur   . Hyperlipemia   . HYPOTHYROIDISM 05/29/2007  . Irritable bowel syndrome   . Migraines    hx of   . 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 PONV (postoperative nausea and vomiting)   . PVC (premature ventricular contraction)   . Type II or unspecified type diabetes mellitus with neurological manifestations, not stated as uncontrolled(250.60) 04/07/2013  . Unspecified essential hypertension    denies htn on 05/09/16   BP 138/69 (BP Location: Right Arm, Patient Position: Sitting, Cuff Size: Large)   Pulse 71   SpO2 97%   Opioid Risk Score:   Fall Risk Score:  `1  Depression screen PHQ 2/9  Depression screen [facility] 2/9 02/03/2016 10/23/2015 10/05/2015 02/04/2015 09/11/2014 10/15/2013  Decreased Interest 0 2 2 0 2 0  Down, Depressed, Hopeless 0 0 0 0 0 0  PHQ - 2 Score 0 2 2 0 2 0  Altered sleeping - - 3 - 0 -  Tired, decreased energy - - 1 - 2 -  Change in appetite - - 0 - 0 -  Feeling bad or failure about yourself  - - 0 - 0 -  Trouble concentrating - - 1 - 1 -  Moving slowly or fidgety/restless - - 0 - 0 -  Suicidal thoughts - - 0 - 0 -  PHQ-9 Score - - 7 - 5 -  Difficult doing work/chores - - Somewhat difficult - - -  Some recent data might be hidden   Review of Systems  HENT: Negative.   Eyes: Negative.   Cardiovascular: Negative.   Gastrointestinal: Negative.   Endocrine: Negative.   Genitourinary: Positive for difficulty urinating.  Musculoskeletal: Positive for arthralgias and back pain.  Skin: Negative.   Allergic/Immunologic: Negative.   Neurological: Positive for tremors and weakness.  Hematological: Negative.   Psychiatric/Behavioral: Positive for dysphoric mood.  All other systems reviewed and are negative.      Objective:   Physical Exam  Constitutional: She is oriented to person, place, and time. She appears well-developed and well-nourished.  HENT:  Head: Normocephalic and atraumatic.  Neck: Normal range of motion.  Neck supple.  Cardiovascular: Normal rate and regular rhythm.   Pulmonary/Chest: Effort normal and breath sounds normal.  Musculoskeletal:  Normal Muscle Bulk and Muscle Testing Reveals: Upper Extremities: Full ROM and Muscle Strength 5/5 Thoracic Paraspinal Tenderness: T-7-T-9 Lower Extremities: Full ROM and Muscle Strength 5/5 Bilateral Lower Extremities Flexion Produces Pain into patella's Arises from Table with ease Narrow Based Gait   Neurological: She is alert and oriented to person, place, and time.  Skin: Skin is warm and dry.  Psychiatric: She has a normal mood and affect.  Nursing note and vitals reviewed.  Assessment & Plan:  1. Cervical postlaminectomy syndrome: with neck and shoulder pain. Continue exercise routine and using heat therapy. Continue MS Contin 15 mg one tablet three times a day #90.  We will continue the opioid monitoring program, this consists of regular clinic visits, examinations, urine drug screen, pill counts as well as use of [place] Controlled Substance reporting System. 2. Fibromyalgia: Continue with Gabapentin, activity and exercise regime. 3. Right Shoulder Pain: No complaints today.Continue HEP and alternate heat and ice therapy 4. Bilateral Knee Pain/ Mild Degenerative Changes: RX-Bilateral Knee X-rays .Continue Voltaren Gel.  20 minutes of face to face patient care time was spent during this visit. All questions were encouraged and answered.   F/U in 1 month

## 2016-06-13 ENCOUNTER — Encounter: Payer: Self-pay | Admitting: Cardiology

## 2016-06-13 ENCOUNTER — Ambulatory Visit (INDEPENDENT_AMBULATORY_CARE_PROVIDER_SITE_OTHER): Payer: Medicare Other | Admitting: Cardiology

## 2016-06-13 DIAGNOSIS — R072 Precordial pain: Secondary | ICD-10-CM | POA: Diagnosis not present

## 2016-06-13 DIAGNOSIS — I10 Essential (primary) hypertension: Secondary | ICD-10-CM

## 2016-06-13 NOTE — Patient Instructions (Signed)
NO CHANGES IN CURRENT MEDICATIONS   Your physician recommends that you schedule a follow-up appointment in AS NEEDED BASIS  If you need a refill on your cardiac medications before your next appointment, please call your pharmacy.

## 2016-06-13 NOTE — Progress Notes (Signed)
PCP: Owens Loffler, MD  Clinic Note: Chief Complaint  Patient presents with  . Follow-up    pt states no chest pain no SOB no dizziness   . Edema    both ankles     HPI: [name] is a 65 y.o. female with a PMH below who presents today for initial cardiology consultation for CP.    [name] seen back in October to evaluate chest discomfort scrubs a sharp pain in the center of her chest. I felt that this either was related to costochondritis or GERD. We tried NSAIDs, but she didn't complete the course, because her symptoms improved after taking her H2 blocker and PPI.  Recent Hospitalizations: n/a  Studies Reviewed:   Echo 2009: Normal LV size and function. EF 60-65%. No regional wall motion and amount is. Mild aortic sclerosis, no stenosis    Interval History: [name] presents todayFor follow-up after her last visit with atypical sounding chest discomfort symptoms. She says that since she saw her GI doctor and was started back on PPI and H2 blocker, her symptoms of all but gone away. No further chest tightness or pressure. No PND orthopnea or edema. She does get some exertional dyspnea but continues after her deconditioning. No rapid irregular heartbeats or palpitations. No syncope/near syncope. No TIA/amaurosis fugax symptoms. No claudication.  ROS: A comprehensive was performed. Review of Systems  Constitutional: Negative for chills, fever and weight loss.  HENT: Negative for congestion and nosebleeds.   Respiratory: Negative for cough, shortness of breath and wheezing.   Cardiovascular: Negative for chest pain and leg swelling.  Gastrointestinal: Positive for heartburn (It does appear that her symptoms improved dramatically with PPI and H2 blocker.). Negative for abdominal pain, blood in stool and constipation.       While placed back on standing dose of PPI and H2 blocker, her symptoms improved.  Genitourinary: Negative for hematuria.  Musculoskeletal:  Positive for back pain and joint pain (Knee). Negative for myalgias.  Neurological: Negative for dizziness and headaches.  Psychiatric/Behavioral: Negative for depression and memory loss. The patient is not nervous/anxious and does not have insomnia.   All other systems reviewed and are negative.   Past Medical History:  Diagnosis Date  . Arthritis   . B12 deficiency   . Complication of anesthesia    slow to wake up   . Depression   . Diabetes mellitus without complication ([place])    type II - diet controlled   . Fibromyalgia   . Generalized hyperhidrosis 04/17/2014  . GERD (gastroesophageal reflux disease)   . Heart murmur    Aortic Sclerosis  . Hyperlipemia   . HYPOTHYROIDISM 05/29/2007  . Irritable bowel syndrome   . Migraines    hx of   . 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 PONV (postoperative nausea and vomiting)   . PVC (premature ventricular contraction)   . Type II or unspecified type diabetes mellitus with neurological manifestations, not stated as uncontrolled(250.60) 04/07/2013  . Unspecified essential hypertension    denies htn on 05/09/16    Past Surgical History:  Procedure Laterality Date  . [place] STUDY N/A 01/04/2016   Procedure: [place] STUDY;  Surgeon: Manus Gunning, MD;  Location: [facility];  Service: Gastroenterology;  Laterality: N/A;  . ABDOMINAL HYSTERECTOMY  1988  . ADENOIDECTOMY    . ANTERIOR CERVICAL DECOMP/DISCECTOMY FUSION  2009, 1993   first procedure  in ~ 1993/Dr Botero  . APPENDECTOMY  1988  . BACK SURGERY    . CHOLECYSTECTOMY  2005  . ESOPHAGEAL MANOMETRY N/A 01/04/2016   Procedure: ESOPHAGEAL MANOMETRY (EM) 24 HR PH;  Surgeon: Manus Gunning, MD;  Location: [facility];  Service: Gastroenterology;  Laterality: N/A;  . ESOPHAGOGASTRODUODENOSCOPY (EGD) WITH PROPOFOL N/A 05/17/2016   Procedure: ESOPHAGOGASTRODUODENOSCOPY (EGD) WITH PROPOFOL;  Surgeon: Manus Gunning, MD;   Location: [facility];  Service: Gastroenterology;  Laterality: N/A;  . NASAL SINUS SURGERY  2006  . SAVORY DILATION N/A 05/17/2016   Procedure: SAVORY DILATION;  Surgeon: Manus Gunning, MD;  Location: [facility];  Service: Gastroenterology;  Laterality: N/A;  . TONSILLECTOMY AND ADENOIDECTOMY    . TRANSTHORACIC ECHOCARDIOGRAM  2009   Normal LV size and function. EF 60-65%. No regional wall motion and amount is. Mild aortic sclerosis, no stenosis  . TUBAL LIGATION  1979    Current Meds  Medication Sig  . aspirin 81 MG tablet Take 81 mg by mouth daily.    . B Complex Vitamins (B COMPLEX 100 PO) Take 1 tablet by mouth daily.  . Calcium Carbonate (CALCIUM 600 PO) Take 600 mg by mouth 2 (two) times daily.   . carboxymethylcellulose (REFRESH PLUS) 0.5 % SOLN Place 1 drop into both eyes 3 (three) times daily as needed.   . Cholecalciferol (VITAMIN D) 2000 UNITS tablet Take 2,000 Units by mouth daily.    . citalopram (CELEXA) 40 MG tablet Take 1 tablet by mouth  daily  . Coenzyme Q-10 100 MG capsule Take 100 mg by mouth daily.  . diclofenac sodium (VOLTAREN) 1 % GEL Apply 4 g topically 4 (four) times daily. (Patient taking differently: Apply 4 g topically 4 (four) times daily as needed. )  . gabapentin (NEURONTIN) 600 MG tablet Take 1 tablet by mouth 3  times daily  . GRAPE SEED EXTRACT PO Take by mouth. 1 tablet daily  . levothyroxine (SYNTHROID, LEVOTHROID) 50 MCG tablet Take 1 tablet by mouth  daily  . loratadine (CLARITIN) 10 MG tablet Take 10 mg by mouth daily.  . magnesium gluconate (MAGONATE) 500 MG tablet Take 1,000 mg by mouth at bedtime.   . methocarbamol (ROBAXIN) 500 MG tablet TAKE ONE (1) TABLET BY MOUTH EVERY EIGHTHOURS AS NEEDED FOR MUSCLE SPASMS  . mirtazapine (REMERON) 15 MG tablet Take 1 tablet by mouth at  bedtime  . morphine (MS CONTIN) 15 MG 12 hr tablet Take 1 tablet (15 mg total) by mouth 3 (three) times daily.  . mupirocin ointment (BACTROBAN) 2 % PLACE 1  APPLICATION INTO THE NOSE 2 TIMES DAILY FOR 1 WEEK (Patient taking differently: PLACE 1 APPLICATION INTO THE NOSE 2 TIMES DAILY FOR 1 WEEK As needed for boils per patient)  . NON FORMULARY Daith piercing in right ear for headaches  . OVER THE COUNTER MEDICATION Take 1 tablet by mouth daily as needed. gaviscom  . oxybutynin (DITROPAN-XL) 10 MG 24 hr tablet TAKE ONE TABLET BY MOUTH EVERY NIGHT AT BEDTIME  . RABEprazole (ACIPHEX) 20 MG tablet Take 1 tablet (20 mg total) by mouth 2 (two) times daily.  . ranitidine (ZANTAC) 150 MG capsule Take 150 mg by mouth 2 (two) times daily.  Marland Kitchen senna-docusate (PERI-COLACE) 8.6-50 MG per tablet Take 3 tablets by mouth at bedtime as needed for mild constipation.     Allergies  Allergen Reactions  . Rizatriptan Benzoate Anaphylaxis    Throat and Tongue Swelling Closed  . Sumatriptan Other (See  Comments)    Throat closed & Tongue Swelling  . Tizanidine Shortness Of Breath  . Aleve [Naproxen Sodium] Hives  . Clarithromycin     Reaction: unknown  . Cyclobenzaprine Hcl     REACTION: THROAT CLOSED,TOUNGE SWELLING  . Doxycycline     Reaction: unknown  . Duloxetine     Reaction: unknown  . Keflex [Cephalexin] Itching    Sores  . Metronidazole   . Oxycodone Hcl Diarrhea    Vomiting  . Penicillins     TOLERATES CEPHALOSPORINS Reaction: unknown Has patient had a PCN reaction causing immediate rash, facial/tongue/throat swelling, SOB or lightheadedness with hypotension: {unknown Has patient had a PCN reaction causing severe rash involving mucus membranes or skin necrosis: {unknown Has patient had a PCN reaction that required hospitalization {unknown Has patient had a PCN reaction occurring within the last 10 years: {no If all of the above answers are "NO", then may proceed with Cephalosporin use.  . Rosuvastatin     REACTION: MUSCLE ACHES  . Septra [Sulfamethoxazole-Trimethoprim] Other (See Comments)    Rash and itching  . Venlafaxine Hives  . Voltaren  [Diclofenac Sodium] Hives    oral  . Tramadol Diarrhea, Nausea And Vomiting and Palpitations    Social History   Social History  . Marital status: Married    Spouse name: N/A  . Number of children: N/A  . Years of education: N/A   Occupational History  . Bookkeeper    Social History Main Topics  . Smoking status: Never Smoker  . Smokeless tobacco: Never Used  . Alcohol use No  . Drug use: No  . Sexual activity: Not Asked   Other Topics Concern  . None   Social History Narrative  . None   Family History  Problem Relation Age of Onset  . Breast cancer Mother   . Diabetes Mother   . Heart disease Mother   . Allergies Mother   . Diabetes Father   . Heart disease Father   . Emphysema Father   . Allergies Father   . Stomach cancer Maternal Uncle   . Breast cancer Paternal Aunt   . Colon cancer Neg Hx     Wt Readings from Last 3 Encounters:  06/13/16 102.7 kg (226 lb 6.4 oz)  05/17/16 101.2 kg (223 lb)  05/02/16 101.3 kg (223 lb 6.4 oz)  - wgt last week was 220 not 200.   PHYSICAL EXAM BP 121/60   Pulse 68   Ht 5' 6.5" (1.689 m)   Wt 102.7 kg (226 lb 6.4 oz)   SpO2 97%   BMI 35.99 kg/m  General appearance: alert, cooperative, appears stated age, no distress and Moderately obese. Well-nourished well-groomed. Neck: no adenopathy, no carotid bruit and no JVD Lungs: clear to auscultation bilaterally, normal percussion bilaterally and non-labored Heart: regular rate and rhythm, S1 &S2 normal, Soft SEM (has Aortic Sclerosis on Echo) -otherwise No murmur, click, rub or gallop - somewhat distant heart sounds. Unable to palpate PMI.Marland Kitchen Abdomen: soft, non-tender; bowel sounds normal; no masses,  no organomegaly; no HJR Extremities: extremities normal, atraumatic, no cyanosis, and Trace edema Neurologic: Mental status: Alert, oriented, thought content appropriate    Adult ECG Report n/a  Other studies Reviewed: Additional studies/ records that were reviewed today  include:  Recent Labs:  n/a    ASSESSMENT / PLAN: Problem List Items Addressed This Visit    Essential hypertension (Chronic)    Controlled on current meds.      Chest  pain, precordial    Sx relieved with aggressive GERD Rx - no longer having Sx. Unlikely to be cardiac -- would not pursue further investigation for now.  Defer to GI MD & PCP.          Current medicines are reviewed at length with the patient today. (+/- concerns) none The following changes have been made: none  Patient Instructions  NO CHANGES IN CURRENT MEDICATIONS   Your physician recommends that you schedule a follow-up appointment in AS NEEDED BASIS  If you need a refill on your cardiac medications before your next appointment, please call your pharmacy.    Studies Ordered:   No orders of the defined types were placed in this encounter.     Glenetta Hew, M.D., M.S. Interventional Cardiologist   Pager # [phone] Phone # [phone] [address] [place], [place] [address]

## 2016-06-15 ENCOUNTER — Ambulatory Visit
Admission: RE | Admit: 2016-06-15 | Discharge: 2016-06-15 | Disposition: A | Discharge status: Home / Self Care | Payer: Medicare Other | Source: Ambulatory Visit | Attending: Family Medicine | Admitting: Family Medicine

## 2016-06-15 ENCOUNTER — Encounter: Payer: Self-pay | Admitting: Cardiology

## 2016-06-15 DIAGNOSIS — Z1231 Encounter for screening mammogram for malignant neoplasm of breast: Secondary | ICD-10-CM

## 2016-06-15 NOTE — Assessment & Plan Note (Signed)
Sx relieved with aggressive GERD Rx - no longer having Sx. Unlikely to be cardiac -- would not pursue further investigation for now.  Defer to GI MD & PCP.

## 2016-06-15 NOTE — Assessment & Plan Note (Signed)
Controlled on current meds.

## 2016-06-23 DIAGNOSIS — J9610 Chronic respiratory failure, unspecified whether with hypoxia or hypercapnia: Secondary | ICD-10-CM | POA: Diagnosis not present

## 2016-07-04 ENCOUNTER — Encounter: Payer: Self-pay | Admitting: Registered Nurse

## 2016-07-04 ENCOUNTER — Encounter: Payer: Medicare Other | Attending: Physical Medicine & Rehabilitation | Admitting: Registered Nurse

## 2016-07-04 VITALS — BP 131/74 | HR 74 | Resp 16

## 2016-07-04 DIAGNOSIS — Z79899 Other long term (current) drug therapy: Secondary | ICD-10-CM | POA: Diagnosis not present

## 2016-07-04 DIAGNOSIS — M545 Low back pain, unspecified: Secondary | ICD-10-CM

## 2016-07-04 DIAGNOSIS — M5412 Radiculopathy, cervical region: Secondary | ICD-10-CM | POA: Diagnosis not present

## 2016-07-04 DIAGNOSIS — M961 Postlaminectomy syndrome, not elsewhere classified: Secondary | ICD-10-CM

## 2016-07-04 DIAGNOSIS — G894 Chronic pain syndrome: Secondary | ICD-10-CM | POA: Diagnosis not present

## 2016-07-04 DIAGNOSIS — M797 Fibromyalgia: Secondary | ICD-10-CM | POA: Diagnosis not present

## 2016-07-04 DIAGNOSIS — Z5181 Encounter for therapeutic drug level monitoring: Secondary | ICD-10-CM | POA: Diagnosis not present

## 2016-07-04 MED ORDER — MORPHINE SULFATE ER 15 MG PO TBCR: 15.0000 mg | EXTENDED_RELEASE_TABLET | Freq: Three times a day (TID) | ORAL | 0 refills | Status: DC

## 2016-07-04 NOTE — Progress Notes (Signed)
Subjective:    Patient ID: [name], female    DOB: 04-22-1951, 66 y.o.   MRN: [id number]  HPI: [name] is a 66 year old female who returns for follow up appointment for chronic pain and medication refill. She states her pain is located in her neck radiating into her left shoulder and  mid back. She rates her pain 5. Her current exercise regime is walking.  Pain Inventory Average Pain 6 Pain Right Now 5 My pain is constant, burning and aching  In the last 24 hours, has pain interfered with the following? General activity 6 Relation with others 6 Enjoyment of life 6 What TIME of day is your pain at its worst? daytime Sleep (in general) Fair  Pain is worse with: walking, bending, sitting, inactivity, standing and some activites Pain improves with: rest, heat/ice, therapy/exercise, pacing activities, medication, TENS and injections Relief from Meds: 5  Mobility walk without assistance how many minutes can you walk? 10-15 ability to climb steps?  yes do you drive?  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  no  Physicians involved in your care Any changes since last visit?  no   Family History  Problem Relation Age of Onset  . Breast cancer Mother   . Diabetes Mother   . Heart disease Mother   . Allergies Mother   . Diabetes Father   . Heart disease Father   . Emphysema Father   . Allergies Father   . Stomach cancer Maternal Uncle   . Breast cancer Paternal Aunt   . Colon cancer Neg Hx    Social History   Social History  . Marital status: Married    Spouse name: N/A  . Number of children: N/A  . Years of education: N/A   Occupational History  . Bookkeeper    Social History Main Topics  . Smoking status: Never Smoker  . Smokeless tobacco: Never Used   . Alcohol use No  . Drug use: No  . Sexual activity: Not Asked   Other Topics Concern  . None   Social History Narrative  . None   Past Surgical History:  Procedure Laterality Date  . [place] STUDY N/A 01/04/2016   Procedure: [place] STUDY;  Surgeon: Manus Gunning, MD;  Location: [facility];  Service: Gastroenterology;  Laterality: N/A;  . ABDOMINAL HYSTERECTOMY  1988  . ADENOIDECTOMY    . ANTERIOR CERVICAL DECOMP/DISCECTOMY FUSION  2009, 1993   first procedure in ~ 1993/Dr Botero  . APPENDECTOMY  1988  . BACK SURGERY    . CHOLECYSTECTOMY  2005  . ESOPHAGEAL MANOMETRY N/A 01/04/2016   Procedure: ESOPHAGEAL MANOMETRY (EM) 24 HR PH;  Surgeon: Manus Gunning, MD;  Location: [facility];  Service: Gastroenterology;  Laterality: N/A;  . ESOPHAGOGASTRODUODENOSCOPY (EGD) WITH PROPOFOL N/A 05/17/2016   Procedure: ESOPHAGOGASTRODUODENOSCOPY (EGD) WITH PROPOFOL;  Surgeon: Manus Gunning, MD;  Location: [facility];  Service: Gastroenterology;  Laterality: N/A;  . NASAL SINUS SURGERY  2006  . SAVORY DILATION N/A 05/17/2016   Procedure: SAVORY DILATION;  Surgeon: Manus Gunning, MD;  Location: [facility];  Service: Gastroenterology;  Laterality: N/A;  . TONSILLECTOMY AND ADENOIDECTOMY    . TRANSTHORACIC ECHOCARDIOGRAM  2009   Normal LV size and function. EF 60-65%. No regional wall  motion and amount is. Mild aortic sclerosis, no stenosis  . TUBAL LIGATION  1979   Past Medical History:  Diagnosis Date  . Arthritis   . B12 deficiency   . Complication of anesthesia    slow to wake up   . Depression   . Diabetes mellitus without complication ([place])    type II - diet controlled   . Fibromyalgia   . Generalized hyperhidrosis 04/17/2014  . GERD (gastroesophageal reflux disease)   . Heart murmur    Aortic Sclerosis  . Hyperlipemia   . HYPOTHYROIDISM 05/29/2007  . Irritable bowel syndrome   . Migraines    hx of   . Oxygen dependent    uses 2l  at hs due to MS Contin per patient  . PONV (postoperative nausea and vomiting)   . PVC (premature ventricular contraction)   . Type II or unspecified type diabetes mellitus with neurological manifestations, not stated as uncontrolled(250.60) 04/07/2013  . Unspecified essential hypertension    denies htn on 05/09/16   BP 131/74   Pulse 74   Resp 16   SpO2 98%   Opioid Risk Score:   Fall Risk Score:  `1  Depression screen PHQ 2/9  Depression screen [facility] 2/9 07/04/2016 02/03/2016 10/23/2015 10/05/2015 02/04/2015 09/11/2014 10/15/2013  Decreased Interest 0 0 2 2 0 2 0  Down, Depressed, Hopeless 0 0 0 0 0 0 0  PHQ - 2 Score 0 0 2 2 0 2 0  Altered sleeping - - - 3 - 0 -  Tired, decreased energy - - - 1 - 2 -  Change in appetite - - - 0 - 0 -  Feeling bad or failure about yourself  - - - 0 - 0 -  Trouble concentrating - - - 1 - 1 -  Moving slowly or fidgety/restless - - - 0 - 0 -  Suicidal thoughts - - - 0 - 0 -  PHQ-9 Score - - - 7 - 5 -  Difficult doing work/chores - - - Somewhat difficult - - -  Some recent data might be hidden    Review of Systems  Constitutional: Negative.   HENT: Negative.   Eyes: Negative.   Respiratory: Positive for cough.   Cardiovascular: Positive for leg swelling.  Gastrointestinal: Positive for constipation.  Endocrine: Negative.   Genitourinary: Negative.   Musculoskeletal: Negative.   Skin: Negative.   Allergic/Immunologic: Negative.   Neurological: Negative.   Hematological: Negative.   Psychiatric/Behavioral: Negative.   All other systems reviewed and are negative.      Objective:   Physical Exam  Constitutional: She is oriented to person, place, and time. She appears well-developed and well-nourished.  HENT:  Head: Normocephalic and atraumatic.  Neck: Normal range of motion. Neck supple.  No Cervical Tenderness Noted  Cardiovascular: Normal rate and regular rhythm.   Pulmonary/Chest: Effort normal and breath sounds normal.    Musculoskeletal:  Normal Muscle Bulk and Muscle Testing Reveals: Upper Extremities: Full ROM and Muscle Strength 5/5 Left AC Joint Tenderness Thoracic Paraspinal Tenderness: T-1-T-3 Lower Extremities: Full ROM and Muscle Strength 5/5 Arises from Table with ease Narrow Based Gait  Neurological: She is alert and oriented to person, place, and time.  Skin: Skin is warm and dry.  Psychiatric: She has a normal mood and affect.  Nursing note and vitals reviewed.         Assessment & Plan:  1. Cervical postlaminectomy syndrome: with neck and shoulder pain. Continue exercise routine and  using heat therapy. Continue MS Contin 15 mg one tablet three times a day #90.  We will continue the opioid monitoring program, this consists of regular clinic visits, examinations, urine drug screen, pill counts as well as use of [place] Controlled Substance reporting System. 2. Fibromyalgia: Continue with Gabapentin, activity and exercise regime. 3. Right Shoulder Pain: No complaints today.Continue HEP and alternate heat and ice therapy 4. Bilateral Knee Pain/ Mild Degenerative Changes:.Continue Voltaren Gel.  74minutes of face to face patient care time was spent during this visit. All questions were encouraged and answered.   F/U in 1 month

## 2016-07-08 LAB — 6-ACETYLMORPHINE,TOXASSURE ADD
6-ACETYLMORPHINE: NEGATIVE
6-acetylmorphine: NOT DETECTED ng/mg creat

## 2016-07-08 LAB — TOXASSURE SELECT,+ANTIDEPR,UR

## 2016-07-11 NOTE — Progress Notes (Signed)
Urine drug screen for this encounter is consistent for prescribed medication 

## 2016-07-14 ENCOUNTER — Other Ambulatory Visit: Payer: Self-pay | Admitting: Family Medicine

## 2016-07-14 ENCOUNTER — Other Ambulatory Visit: Payer: Self-pay | Admitting: Physical Medicine & Rehabilitation

## 2016-07-14 ENCOUNTER — Telehealth: Payer: Self-pay | Admitting: Registered Nurse

## 2016-07-14 NOTE — Telephone Encounter (Signed)
On January 18,2018 NCCSR was reviewed: No conflict was seen on the [place] with Multiple Prescribers. Ms. Philbrick has a signed  Narcotic Contract with our office. If there were any discrepancies this would have been  reported to her  Physcian.

## 2016-07-24 DIAGNOSIS — J9610 Chronic respiratory failure, unspecified whether with hypoxia or hypercapnia: Secondary | ICD-10-CM | POA: Diagnosis not present

## 2016-08-01 ENCOUNTER — Encounter: Payer: Self-pay | Admitting: Registered Nurse

## 2016-08-01 ENCOUNTER — Encounter: Payer: Medicare Other | Attending: Physical Medicine & Rehabilitation | Admitting: Registered Nurse

## 2016-08-01 VITALS — BP 129/74 | HR 69 | Resp 14

## 2016-08-01 DIAGNOSIS — M5412 Radiculopathy, cervical region: Secondary | ICD-10-CM | POA: Diagnosis not present

## 2016-08-01 DIAGNOSIS — Z5181 Encounter for therapeutic drug level monitoring: Secondary | ICD-10-CM | POA: Insufficient documentation

## 2016-08-01 DIAGNOSIS — M545 Low back pain, unspecified: Secondary | ICD-10-CM

## 2016-08-01 DIAGNOSIS — G894 Chronic pain syndrome: Secondary | ICD-10-CM | POA: Insufficient documentation

## 2016-08-01 DIAGNOSIS — Z79899 Other long term (current) drug therapy: Secondary | ICD-10-CM

## 2016-08-01 DIAGNOSIS — M961 Postlaminectomy syndrome, not elsewhere classified: Secondary | ICD-10-CM

## 2016-08-01 DIAGNOSIS — M797 Fibromyalgia: Secondary | ICD-10-CM

## 2016-08-01 MED ORDER — MORPHINE SULFATE ER 15 MG PO TBCR: 15.0000 mg | EXTENDED_RELEASE_TABLET | Freq: Three times a day (TID) | ORAL | 0 refills | Status: DC

## 2016-08-01 NOTE — Progress Notes (Signed)
Subjective:    Patient ID: [name], female    DOB: [birth year], 66 y.o.   MRN: [id number]  HPI: [name] is a 66 year old female who returns for follow up appointment for chronic pain and medication refill. She states her pain is located in her neck radiating into her right shoulder and  mid back.She rates her pain 5. Her current exercise regime is walking and performing stretching exercises. [name] forgot her medication today, NCCSR was reviewed she picked up her MS Contin on 07/07/2016. Narcotic Policy was reviewed and she verbalizes understanding.   Pain Inventory Average Pain 5 Pain Right Now 5 My pain is constant, burning and aching  In the last 24 hours, has pain interfered with the following? General activity 6 Relation with others 6 Enjoyment of life 6 What TIME of day is your pain at its worst? daytime, evening  Sleep (in general) Fair  Pain is worse with: walking, bending, sitting, standing and some activites Pain improves with: rest, heat/ice, therapy/exercise, pacing activities, medication, TENS and injections Relief from Meds: 5  Mobility walk without assistance how many minutes can you walk? 10-15 ability to climb steps?  yes do you drive?  yes Do you have any goals in this area?  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  yes  Neuro/Psych bladder control problems weakness numbness tremor tingling spasms depression  Prior Studies Any changes since last visit?  no  Physicians involved in your care Any changes since last visit?  no   Family History  Problem Relation Age of Onset  . Breast cancer Mother   . Diabetes Mother   . Heart disease Mother   . Allergies Mother   . Diabetes Father   . Heart disease Father   . Emphysema Father   . Allergies  Father   . Stomach cancer Maternal Uncle   . Breast cancer Paternal Aunt   . Colon cancer Neg Hx    Social History   Social History  . Marital status: Married    Spouse name: N/A  . Number of children: N/A  . Years of education: N/A   Occupational History  . Bookkeeper    Social History Main Topics  . Smoking status: Never Smoker  . Smokeless tobacco: Never Used  . Alcohol use No  . Drug use: No  . Sexual activity: Not Asked   Other Topics Concern  . None   Social History Narrative  . None   Past Surgical History:  Procedure Laterality Date  . [place] STUDY N/A 01/04/2016   Procedure: [place] STUDY;  Surgeon: Manus Gunning, MD;  Location: [facility];  Service: Gastroenterology;  Laterality: N/A;  . ABDOMINAL HYSTERECTOMY  1988  . ADENOIDECTOMY    . ANTERIOR CERVICAL DECOMP/DISCECTOMY FUSION  2009, 1993   first procedure in ~ 1993/Dr Botero  . APPENDECTOMY  1988  . BACK SURGERY    . CHOLECYSTECTOMY  2005  . ESOPHAGEAL MANOMETRY N/A 01/04/2016   Procedure: ESOPHAGEAL MANOMETRY (EM) 24 HR PH;  Surgeon: Manus Gunning, MD;  Location: [facility];  Service: Gastroenterology;  Laterality: N/A;  . ESOPHAGOGASTRODUODENOSCOPY (EGD) WITH PROPOFOL N/A 05/17/2016   Procedure: ESOPHAGOGASTRODUODENOSCOPY (EGD) WITH PROPOFOL;  Surgeon: Manus Gunning, MD;  [facility];  Service: Gastroenterology;  Laterality: N/A;  . NASAL SINUS SURGERY  2006  . SAVORY DILATION N/A 05/17/2016   Procedure: SAVORY DILATION;  Surgeon: Manus Gunning, MD;  Location: [facility];  Service: Gastroenterology;  Laterality: N/A;  . TONSILLECTOMY AND ADENOIDECTOMY    . TRANSTHORACIC ECHOCARDIOGRAM  2009   Normal LV size and function. EF 60-65%. No regional wall motion and amount is. Mild aortic sclerosis, no stenosis  . TUBAL LIGATION  1979   Past Medical History:  Diagnosis Date  . Arthritis   . B12 deficiency   . Complication of anesthesia    slow  to wake up   . Depression   . Diabetes mellitus without complication ([place])    type II - diet controlled   . Fibromyalgia   . Generalized hyperhidrosis 04/17/2014  . GERD (gastroesophageal reflux disease)   . Heart murmur    Aortic Sclerosis  . Hyperlipemia   . HYPOTHYROIDISM 05/29/2007  . Irritable bowel syndrome   . Migraines    hx of   . 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 PONV (postoperative nausea and vomiting)   . PVC (premature ventricular contraction)   . Type II or unspecified type diabetes mellitus with neurological manifestations, not stated as uncontrolled(250.60) 04/07/2013  . Unspecified essential hypertension    denies htn on 05/09/16   BP 129/74   Pulse 69   Resp 14   SpO2 98%   Opioid Risk Score:   Fall Risk Score:  `1  Depression screen PHQ 2/9  Depression screen [facility] 2/9 07/04/2016 02/03/2016 10/23/2015 10/05/2015 02/04/2015 09/11/2014 10/15/2013  Decreased Interest 0 0 2 2 0 2 0  Down, Depressed, Hopeless 0 0 0 0 0 0 0  PHQ - 2 Score 0 0 2 2 0 2 0  Altered sleeping - - - 3 - 0 -  Tired, decreased energy - - - 1 - 2 -  Change in appetite - - - 0 - 0 -  Feeling bad or failure about yourself  - - - 0 - 0 -  Trouble concentrating - - - 1 - 1 -  Moving slowly or fidgety/restless - - - 0 - 0 -  Suicidal thoughts - - - 0 - 0 -  PHQ-9 Score - - - 7 - 5 -  Difficult doing work/chores - - - Somewhat difficult - - -  Some recent data might be hidden    Review of Systems  HENT: Negative.   Eyes: Negative.   Respiratory: Negative.   Cardiovascular: Positive for leg swelling.  Gastrointestinal: Positive for constipation.  Endocrine: Negative.   Genitourinary: Negative.   Musculoskeletal: Positive for back pain.       Spasms  Skin: Negative.   Allergic/Immunologic: Negative.   Neurological: Positive for tremors, weakness and numbness.       Tingling   Hematological: Negative.   Psychiatric/Behavioral: Positive for dysphoric mood.    All other systems reviewed and are negative.      Objective:   Physical Exam  Constitutional: She is oriented to person, place, and time. She appears well-developed and well-nourished.  HENT:  Head: Normocephalic and atraumatic.  Neck: Normal range of motion. Neck supple.  Cardiovascular: Normal rate and regular rhythm.   Pulmonary/Chest: Effort normal and breath sounds normal.  Musculoskeletal:  Normal Muscle Bulk and Muscle Testing Reveals: Upper Extremities: Full ROM and Muscle Strength 5/5 Right AC Joint Tenderness Thoracic Paraspinal Tenderness:  T-7-T-9 Lower Extremities: Full ROM and Muscle Strength 5/5 Arise from table with ease Narrow Based Gait  Neurological: She is alert and oriented to person, place, and time.  Skin: Skin is warm and dry.  Psychiatric: She has a normal mood and affect.  Nursing note and vitals reviewed.         Assessment & Plan:  1. Cervical postlaminectomy syndrome: with neck and shoulder pain. 08/01/2016.  Continue exercise routine and using heat therapy. Continue MS Contin 15 mg one tablet three times a day #90.  We will continue the opioid monitoring program, this consists of regular clinic visits, examinations, urine drug screen, pill counts as well as use of [place] Controlled Substance reporting System. 2. Fibromyalgia: Continue with Gabapentin, activity and exercise regime. 08/01/2016. 3. Right Shoulder Pain: No complaints today.Continue HEP and alternate heat and ice therapy. 08/01/2016 4. Bilateral Knee Pain/ Mild Degenerative Changes:.Continue Voltaren Gel. 08/01/2016  15 minutes of face to face patient care time was spent during this visit. All questions were encouraged and answered.  F/U in 1 month

## 2016-08-24 DIAGNOSIS — J9610 Chronic respiratory failure, unspecified whether with hypoxia or hypercapnia: Secondary | ICD-10-CM | POA: Diagnosis not present

## 2016-08-29 ENCOUNTER — Encounter: Payer: Medicare Other | Attending: Physical Medicine & Rehabilitation | Admitting: Registered Nurse

## 2016-08-29 ENCOUNTER — Encounter: Payer: Self-pay | Admitting: Registered Nurse

## 2016-08-29 VITALS — BP 122/64 | HR 66 | Resp 14

## 2016-08-29 DIAGNOSIS — Z79899 Other long term (current) drug therapy: Secondary | ICD-10-CM | POA: Diagnosis not present

## 2016-08-29 DIAGNOSIS — G894 Chronic pain syndrome: Secondary | ICD-10-CM | POA: Diagnosis not present

## 2016-08-29 DIAGNOSIS — M5412 Radiculopathy, cervical region: Secondary | ICD-10-CM

## 2016-08-29 DIAGNOSIS — M545 Low back pain, unspecified: Secondary | ICD-10-CM

## 2016-08-29 DIAGNOSIS — M961 Postlaminectomy syndrome, not elsewhere classified: Secondary | ICD-10-CM | POA: Diagnosis not present

## 2016-08-29 DIAGNOSIS — M797 Fibromyalgia: Secondary | ICD-10-CM | POA: Diagnosis not present

## 2016-08-29 DIAGNOSIS — Z5181 Encounter for therapeutic drug level monitoring: Secondary | ICD-10-CM | POA: Insufficient documentation

## 2016-08-29 MED ORDER — MORPHINE SULFATE ER 15 MG PO TBCR: 15.0000 mg | EXTENDED_RELEASE_TABLET | Freq: Three times a day (TID) | ORAL | 0 refills | Status: DC

## 2016-08-29 NOTE — Progress Notes (Signed)
Subjective:    Patient ID: [name], female    DOB: March 25, 1951, 66 y.o.   MRN: [id number]  HPI: [name] is a 66 year old female who returns for follow up appointmentfor chronic pain and medication refill. She states her pain is located in her neck radiating into her right shoulder, mid back and bilateral knee pain. Also states she extended her self at the gym, we discussed temperance, she verbalizes understanding. She rates her pain 5. Her current exercise regime is  attending the [facility], walking and performing stretching exercises.  Pain Inventory Average Pain 5 Pain Right Now 5 My pain is constant, burning and aching  In the last 24 hours, has pain interfered with the following? General activity 6 Relation with others 6 Enjoyment of life 6 What TIME of day is your pain at its worst? evening, night Sleep (in general) Fair  Pain is worse with: walking, bending, sitting, standing and some activites Pain improves with: rest, heat/ice, therapy/exercise, pacing activities, medication, TENS and injections Relief from Meds: 5  Mobility walk without assistance how many minutes can you walk? 10-15 ability to climb steps?  yes do you drive?  yes Do you have any goals in this area?  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  yes  Neuro/Psych bladder control problems weakness numbness tremor depression  Prior Studies Any changes since last visit?  no  Physicians involved in your care Any changes since last visit?  no   Family History  Problem Relation Age of Onset  . Breast cancer Mother   . Diabetes Mother   . Heart disease Mother   . Allergies Mother   . Diabetes Father   . Heart disease Father   . Emphysema Father   . Allergies Father   . Stomach cancer Maternal Uncle   . Breast cancer Paternal Aunt   .  Colon cancer Neg Hx    Social History   Social History  . Marital status: Married    Spouse name: N/A  . Number of children: N/A  . Years of education: N/A   Occupational History  . Bookkeeper    Social History Main Topics  . Smoking status: Never Smoker  . Smokeless tobacco: Never Used  . Alcohol use No  . Drug use: No  . Sexual activity: Not Asked   Other Topics Concern  . None   Social History Narrative  . None   Past Surgical History:  Procedure Laterality Date  . [place] STUDY N/A 01/04/2016   Procedure: [place] STUDY;  Surgeon: Manus Gunning, MD;  [facility]: [facility];  Service: Gastroenterology;  Laterality: N/A;  . ABDOMINAL HYSTERECTOMY  1988  . ADENOIDECTOMY    . ANTERIOR CERVICAL DECOMP/DISCECTOMY FUSION  2009, 1993   first procedure in ~ 1993/Dr Botero  . APPENDECTOMY  1988  . BACK SURGERY    . CHOLECYSTECTOMY  2005  . ESOPHAGEAL MANOMETRY N/A 01/04/2016   Procedure: ESOPHAGEAL MANOMETRY (EM) 24 HR PH;  Surgeon: Manus Gunning, MD;  Location: [facility];  Service: Gastroenterology;  Laterality: N/A;  . ESOPHAGOGASTRODUODENOSCOPY (EGD) WITH PROPOFOL N/A 05/17/2016   Procedure: ESOPHAGOGASTRODUODENOSCOPY (EGD) WITH PROPOFOL;  Surgeon: Manus Gunning, MD;  [facility]: [facility];  Service: Gastroenterology;  Laterality: N/A;  . NASAL SINUS SURGERY  2006  .  SAVORY DILATION N/A 05/17/2016   Procedure: SAVORY DILATION;  Surgeon: Manus Gunning, MD;  Location: [facility];  Service: Gastroenterology;  Laterality: N/A;  . TONSILLECTOMY AND ADENOIDECTOMY    . TRANSTHORACIC ECHOCARDIOGRAM  2009   Normal LV size and function. EF 60-65%. No regional wall motion and amount is. Mild aortic sclerosis, no stenosis  . TUBAL LIGATION  1979   Past Medical History:  Diagnosis Date  . Arthritis   . B12 deficiency   . Complication of anesthesia    slow to wake up   . Depression   . Diabetes mellitus without complication ([place])     type II - diet controlled   . Fibromyalgia   . Generalized hyperhidrosis 04/17/2014  . GERD (gastroesophageal reflux disease)   . Heart murmur    Aortic Sclerosis  . Hyperlipemia   . HYPOTHYROIDISM 05/29/2007  . Irritable bowel syndrome   . Migraines    hx of   . 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 PONV (postoperative nausea and vomiting)   . PVC (premature ventricular contraction)   . Type II or unspecified type diabetes mellitus with neurological manifestations, not stated as uncontrolled(250.60) 04/07/2013  . Unspecified essential hypertension    denies htn on 05/09/16   BP 122/64 (BP Location: Right Arm, Patient Position: Sitting, Cuff Size: Large)   Pulse 66   Resp 14   SpO2 98%   Opioid Risk Score:   Fall Risk Score:  `1  Depression screen PHQ 2/9  Depression screen [facility] 2/9 07/04/2016 02/03/2016 10/23/2015 10/05/2015 02/04/2015 09/11/2014 10/15/2013  Decreased Interest 0 0 2 2 0 2 0  Down, Depressed, Hopeless 0 0 0 0 0 0 0  PHQ - 2 Score 0 0 2 2 0 2 0  Altered sleeping - - - 3 - 0 -  Tired, decreased energy - - - 1 - 2 -  Change in appetite - - - 0 - 0 -  Feeling bad or failure about yourself  - - - 0 - 0 -  Trouble concentrating - - - 1 - 1 -  Moving slowly or fidgety/restless - - - 0 - 0 -  Suicidal thoughts - - - 0 - 0 -  PHQ-9 Score - - - 7 - 5 -  Difficult doing work/chores - - - Somewhat difficult - - -  Some recent data might be hidden    Review of Systems  HENT: Negative.   Eyes: Negative.   Respiratory: Negative.   Cardiovascular: Positive for leg swelling.  Gastrointestinal: Positive for constipation.  Endocrine: Negative.   Genitourinary: Positive for urgency.  Musculoskeletal: Positive for arthralgias, back pain, myalgias and neck pain.  Skin: Negative.   Allergic/Immunologic: Negative.   Neurological: Positive for tremors, weakness and numbness.  Psychiatric/Behavioral: Positive for dysphoric mood.  All other systems  reviewed and are negative.      Objective:   Physical Exam  Constitutional: She is oriented to person, place, and time. She appears well-developed and well-nourished.  HENT:  Head: Normocephalic and atraumatic.  Neck: Normal range of motion. Neck supple.  Cardiovascular: Normal rate and regular rhythm.   Pulmonary/Chest: Effort normal and breath sounds normal.  Musculoskeletal:  Normal Muscle Bulk and Muscle Testing Reveals: Upper Extremities: Full ROM and Muscle Strength 5/5 Back without spinal tenderness noted Lower Extremities: Full ROM and Muscle Strength 5/5 Bilateral Lower Extremities Flexion Produces Pain into Bilateral Patella's : Swelling Noted  Arises from Table with ease Narrow Based Gait  Neurological: She is alert and oriented to person, place, and time.  Skin: Skin is warm and dry.  Psychiatric: She has a normal mood and affect.  Nursing note and vitals reviewed.         Assessment & Plan:  1. Cervical postlaminectomy syndrome: with neck and shoulder pain. 08/29/2016.  Continue exercise routine and using heat therapy. Continue MS Contin 15 mg one tablet three times a day #90.  We will continue the opioid monitoring program, this consists of regular clinic visits, examinations, urine drug screen, pill counts as well as use of [place] Controlled Substance reporting System. 2. Fibromyalgia: Continue with Gabapentin, activity and exercise regime. 08/29/2016. 3. Right Shoulder Pain: No complaints today.Continue HEP and alternate heat and ice therapy. 08/29/2016 4. Bilateral Knee Pain/ Mild Degenerative Changes:.Continue Voltaren Gel. 08/29/2016  20 minutes of face to face patient care time was spent during this visit. All questions were encouraged and answered.  F/U in 1 month

## 2016-09-03 ENCOUNTER — Other Ambulatory Visit: Payer: Self-pay | Admitting: Registered Nurse

## 2016-09-21 DIAGNOSIS — J9610 Chronic respiratory failure, unspecified whether with hypoxia or hypercapnia: Secondary | ICD-10-CM | POA: Diagnosis not present

## 2016-09-27 ENCOUNTER — Ambulatory Visit
Admission: RE | Admit: 2016-09-27 | Discharge: 2016-09-27 | Disposition: A | Discharge status: Home / Self Care | Payer: Medicare Other | Source: Ambulatory Visit | Attending: Registered Nurse | Admitting: Registered Nurse

## 2016-09-27 ENCOUNTER — Encounter: Payer: Medicare Other | Attending: Physical Medicine & Rehabilitation | Admitting: Registered Nurse

## 2016-09-27 ENCOUNTER — Encounter: Payer: Self-pay | Admitting: Registered Nurse

## 2016-09-27 ENCOUNTER — Telehealth: Payer: Self-pay | Admitting: Registered Nurse

## 2016-09-27 VITALS — BP 113/70 | HR 70

## 2016-09-27 DIAGNOSIS — M545 Low back pain, unspecified: Secondary | ICD-10-CM

## 2016-09-27 DIAGNOSIS — M25571 Pain in right ankle and joints of right foot: Secondary | ICD-10-CM

## 2016-09-27 DIAGNOSIS — Z5181 Encounter for therapeutic drug level monitoring: Secondary | ICD-10-CM | POA: Insufficient documentation

## 2016-09-27 DIAGNOSIS — M797 Fibromyalgia: Secondary | ICD-10-CM

## 2016-09-27 DIAGNOSIS — M961 Postlaminectomy syndrome, not elsewhere classified: Secondary | ICD-10-CM

## 2016-09-27 DIAGNOSIS — M25562 Pain in left knee: Secondary | ICD-10-CM

## 2016-09-27 DIAGNOSIS — G8929 Other chronic pain: Secondary | ICD-10-CM

## 2016-09-27 DIAGNOSIS — M170 Bilateral primary osteoarthritis of knee: Secondary | ICD-10-CM | POA: Diagnosis not present

## 2016-09-27 DIAGNOSIS — Z79899 Other long term (current) drug therapy: Secondary | ICD-10-CM | POA: Insufficient documentation

## 2016-09-27 DIAGNOSIS — S99911A Unspecified injury of right ankle, initial encounter: Secondary | ICD-10-CM | POA: Diagnosis not present

## 2016-09-27 DIAGNOSIS — G894 Chronic pain syndrome: Secondary | ICD-10-CM | POA: Diagnosis not present

## 2016-09-27 DIAGNOSIS — M25561 Pain in right knee: Secondary | ICD-10-CM

## 2016-09-27 MED ORDER — MORPHINE SULFATE ER 15 MG PO TBCR: 15.0000 mg | EXTENDED_RELEASE_TABLET | Freq: Three times a day (TID) | ORAL | 0 refills | Status: DC

## 2016-09-27 NOTE — Telephone Encounter (Signed)
On 09/27/2016 the [place] was reviewed no conflict was seen on the [place] with multiple prescribers. Ms. Mulnix has a signed narcotic contract with our office. If there were any discrepancies this would have been reported to her physician.

## 2016-09-27 NOTE — Progress Notes (Signed)
Subjective:    Patient ID: [name], female    DOB: February 10, 1951, 66 y.o.   MRN: [id number]  HPI: [name] is a 66 year old female who returns for follow up appointmentfor chronic pain and medication refill. She states her pain is located in her mid back, bilateral knees and right ankle pain. Also states her right ankle pain has increased in intensity, denies falling. We will order right ankle X-ray, she verbalizes understanding. She rates her pain 5. Her current exercise regime is walking.   Pain Inventory Average Pain 7 Pain Right Now 5 My pain is constant, burning and aching  In the last 24 hours, has pain interfered with the following? General activity 7 Relation with others 7 Enjoyment of life 7 What TIME of day is your pain at its worst? evening Sleep (in general) Fair  Pain is worse with: walking, bending, sitting, standing and some activites Pain improves with: rest, heat/ice, therapy/exercise, pacing activities, medication, TENS and injections Relief from Meds: 5  Mobility walk without assistance ability to climb steps?  yes do you drive?  yes  Function employed # of hrs/week 2012 I need assistance with the following:  meal prep, household duties and shopping  Neuro/Psych bladder control problems weakness tremor depression  Prior Studies Any changes since last visit?  no  Physicians involved in your care Any changes since last visit?  no   Family History  Problem Relation Age of Onset  . Breast cancer Mother   . Diabetes Mother   . Heart disease Mother   . Allergies Mother   . Diabetes Father   . Heart disease Father   . Emphysema Father   . Allergies Father   . Stomach cancer Maternal Uncle   . Breast cancer Paternal Aunt   . Colon cancer Neg Hx    Social History   Social History  . Marital status: Married    Spouse name: N/A  . Number of children: N/A  . Years of education: N/A   Occupational History  . Bookkeeper     Social History Main Topics  . Smoking status: Never Smoker  . Smokeless tobacco: Never Used  . Alcohol use No  . Drug use: No  . Sexual activity: Not on file   Other Topics Concern  . Not on file   Social History Narrative  . No narrative on file   Past Surgical History:  Procedure Laterality Date  . [place] STUDY N/A 01/04/2016   Procedure: [place] STUDY;  Surgeon: Manus Gunning, MD;  [facility]: [facility];  Service: Gastroenterology;  Laterality: N/A;  . ABDOMINAL HYSTERECTOMY  1988  . ADENOIDECTOMY    . ANTERIOR CERVICAL DECOMP/DISCECTOMY FUSION  2009, 1993   first procedure in ~ 1993/Dr Botero  . APPENDECTOMY  1988  . BACK SURGERY    . CHOLECYSTECTOMY  2005  . ESOPHAGEAL MANOMETRY N/A 01/04/2016   Procedure: ESOPHAGEAL MANOMETRY (EM) 24 HR PH;  Surgeon: Manus Gunning, MD;  [facility]: [facility];  Service: Gastroenterology;  Laterality: N/A;  . ESOPHAGOGASTRODUODENOSCOPY (EGD) WITH PROPOFOL N/A 05/17/2016   Procedure: ESOPHAGOGASTRODUODENOSCOPY (EGD) WITH PROPOFOL;  Surgeon: Manus Gunning, MD;  [facility]: [facility];  Service: Gastroenterology;  Laterality: N/A;  . NASAL SINUS SURGERY  2006  . SAVORY DILATION N/A 05/17/2016   Procedure: SAVORY DILATION;  Surgeon: Manus Gunning, MD;  Location: [facility];  Service: Gastroenterology;  Laterality: N/A;  . TONSILLECTOMY AND ADENOIDECTOMY    .  TRANSTHORACIC ECHOCARDIOGRAM  2009   Normal LV size and function. EF 60-65%. No regional wall motion and amount is. Mild aortic sclerosis, no stenosis  . TUBAL LIGATION  1979   Past Medical History:  Diagnosis Date  . Arthritis   . B12 deficiency   . Complication of anesthesia    slow to wake up   . Depression   . Diabetes mellitus without complication ([place])    type II - diet controlled   . Fibromyalgia   . Generalized hyperhidrosis 04/17/2014  . GERD (gastroesophageal reflux disease)   . Heart murmur    Aortic Sclerosis  .  Hyperlipemia   . HYPOTHYROIDISM 05/29/2007  . Irritable bowel syndrome   . Migraines    hx of   . 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 PONV (postoperative nausea and vomiting)   . PVC (premature ventricular contraction)   . Type II or unspecified type diabetes mellitus with neurological manifestations, not stated as uncontrolled(250.60) 04/07/2013  . Unspecified essential hypertension    denies htn on 05/09/16   There were no vitals taken for this visit.  Opioid Risk Score:   Fall Risk Score:  `1  Depression screen PHQ 2/9  Depression screen [facility] 2/9 07/04/2016 02/03/2016 10/23/2015 10/05/2015 02/04/2015 09/11/2014 10/15/2013  Decreased Interest 0 0 2 2 0 2 0  Down, Depressed, Hopeless 0 0 0 0 0 0 0  PHQ - 2 Score 0 0 2 2 0 2 0  Altered sleeping - - - 3 - 0 -  Tired, decreased energy - - - 1 - 2 -  Change in appetite - - - 0 - 0 -  Feeling bad or failure about yourself  - - - 0 - 0 -  Trouble concentrating - - - 1 - 1 -  Moving slowly or fidgety/restless - - - 0 - 0 -  Suicidal thoughts - - - 0 - 0 -  PHQ-9 Score - - - 7 - 5 -  Difficult doing work/chores - - - Somewhat difficult - - -  Some recent data might be hidden    Review of Systems  Constitutional: Negative.   HENT: Negative.   Eyes: Negative.   Respiratory: Negative.   Cardiovascular: Negative.   Gastrointestinal: Negative.   Endocrine: Negative.   Genitourinary: Negative.   Musculoskeletal: Positive for joint swelling.  Skin: Negative.   Allergic/Immunologic: Negative.   Neurological: Negative.   Hematological: Negative.   Psychiatric/Behavioral: Negative.   All other systems reviewed and are negative.      Objective:   Physical Exam  Constitutional: She is oriented to person, place, and time. She appears well-developed and well-nourished.  HENT:  Head: Normocephalic and atraumatic.  Neck: Normal range of motion. Neck supple.  Cardiovascular: Normal rate and regular rhythm.     Pulmonary/Chest: Effort normal and breath sounds normal.  Musculoskeletal:  Normal Muscle Bulk and Muscle Strength Reveals: Upper Extremities: Full ROM and Muscle Strength 5/5 Thoracic Paraspinal Tenderness: T-7-T-9 Lower Extremities: Full ROM and Muscle Strength 5/5 Bilateral Lower Extremities: Flexion Produces Pain into Bilateral  Patella's Arises from Table with ease Narrow Based Gait   Neurological: She is alert and oriented to person, place, and time.  Skin: Skin is warm and dry.  Psychiatric: She has a normal mood and affect.  Nursing note and vitals reviewed.         Assessment & Plan:  1. Cervical postlaminectomy syndrome: with  neck and shoulder pain. 09/27/2016. Continue exercise routine and using heat therapy. Continue MS Contin 15 mg one tablet three times a day #90.  We will continue the opioid monitoring program, this consists of regular clinic visits, examinations, urine drug screen, pill counts as well as use of [place] Controlled Substance reporting System. 2. Fibromyalgia: Continue with Gabapentin, activity and exercise regime. 09/27/2016. 3. Right Shoulder Pain: No complaints today.Continue HEP and alternate heat and ice therapy. 09/27/2016 4. Bilateral Knee Pain/ Mild Degenerative Changes:.Continue Voltaren Gel, awaiting X-ray results. 09/27/2016 5. Right Ankle Pain: RX: X-ray  20 minutes of face to face patient care time was spent during this visit. All questions were encouraged and answered.  F/U in 1 month

## 2016-09-29 ENCOUNTER — Telehealth: Payer: Self-pay | Admitting: Registered Nurse

## 2016-09-29 ENCOUNTER — Encounter: Payer: Self-pay | Admitting: Registered Nurse

## 2016-09-29 NOTE — Telephone Encounter (Signed)
Placed a call to [name] regarding X-ray results. No answer left message to return the call.

## 2016-09-30 ENCOUNTER — Telehealth: Payer: Self-pay | Admitting: Registered Nurse

## 2016-09-30 NOTE — Telephone Encounter (Signed)
Placed a call to [name] and reviewed X-ray results, she verbalizes understanding.  Still complaining of right ankle pain, will discuss with Dr. Letta Pate. She verbalizes understanding

## 2016-09-30 NOTE — Telephone Encounter (Signed)
Spoke with Dr. Letta Pate regarding [name] Right ankle X-ray recommendation if your pain remains intense to f/u with Ortho. Placed a call to [name] she verbalizes understanding

## 2016-10-01 LAB — 6-ACETYLMORPHINE,TOXASSURE ADD
6-ACETYLMORPHINE: NEGATIVE
6-acetylmorphine: NOT DETECTED ng/mg creat

## 2016-10-01 LAB — TOXASSURE SELECT,+ANTIDEPR,UR

## 2016-10-10 ENCOUNTER — Telehealth: Payer: Self-pay | Admitting: *Deleted

## 2016-10-10 NOTE — Telephone Encounter (Signed)
Urine drug screen for this encounter is consistent for prescribed medication. She is prescribed Mirtazapine and takes prn so its presence would be expected.

## 2016-10-22 DIAGNOSIS — J9610 Chronic respiratory failure, unspecified whether with hypoxia or hypercapnia: Secondary | ICD-10-CM | POA: Diagnosis not present

## 2016-10-26 ENCOUNTER — Encounter: Payer: Self-pay | Admitting: Registered Nurse

## 2016-10-26 ENCOUNTER — Encounter: Payer: Medicare Other | Attending: Physical Medicine & Rehabilitation | Admitting: Registered Nurse

## 2016-10-26 VITALS — BP 129/78 | HR 68

## 2016-10-26 DIAGNOSIS — M25561 Pain in right knee: Secondary | ICD-10-CM

## 2016-10-26 DIAGNOSIS — M25571 Pain in right ankle and joints of right foot: Secondary | ICD-10-CM | POA: Diagnosis not present

## 2016-10-26 DIAGNOSIS — Z5181 Encounter for therapeutic drug level monitoring: Secondary | ICD-10-CM | POA: Diagnosis not present

## 2016-10-26 DIAGNOSIS — M545 Low back pain, unspecified: Secondary | ICD-10-CM

## 2016-10-26 DIAGNOSIS — G894 Chronic pain syndrome: Secondary | ICD-10-CM | POA: Diagnosis not present

## 2016-10-26 DIAGNOSIS — M961 Postlaminectomy syndrome, not elsewhere classified: Secondary | ICD-10-CM

## 2016-10-26 DIAGNOSIS — G8929 Other chronic pain: Secondary | ICD-10-CM

## 2016-10-26 DIAGNOSIS — M25562 Pain in left knee: Secondary | ICD-10-CM

## 2016-10-26 DIAGNOSIS — Z79899 Other long term (current) drug therapy: Secondary | ICD-10-CM

## 2016-10-26 MED ORDER — MORPHINE SULFATE ER 15 MG PO TBCR: 15.0000 mg | EXTENDED_RELEASE_TABLET | Freq: Three times a day (TID) | ORAL | 0 refills | Status: DC

## 2016-10-26 NOTE — Patient Instructions (Signed)
Ms. Lyman would like to try to wean off the Gabapentin:  Gabapentin 300 mg ( half of the 600 mg tablet)  in the morning , and 600 mg in the afternoon and 600 mg in the bedtime.   Call office on Monday 10/31/2016 to evaluate medication change.

## 2016-10-26 NOTE — Progress Notes (Signed)
Subjective:    Patient ID: [name], female    DOB: February 23, 1951, 66 y.o.   MRN: [id number]  HPI: [name] is a 66 year old female who returns for follow up appointmentfor chronic pain and medication refill. She states her pain is located in her mid back, bilateral knees and right ankle pain. She rates her pain 4. Her current exercise regime is walking and attending the [facility] ( [place]) three times a week.  [name] would like to wean off Gabapentin. Slow titration will began today, instructions given. She verbalizes understanding.   Her last UDS was on 09/27/2016, it was consistent.   Pain Inventory Average Pain 5 Pain Right Now 4 My pain is constant, burning and aching  In the last 24 hours, has pain interfered with the following? General activity 5 Relation with others 5 Enjoyment of life 5 What TIME of day is your pain at its worst? night Sleep (in general) Fair  Pain is worse with: some activites Pain improves with: medication Relief from Meds: na  Mobility walk without assistance how many minutes can you walk? 10-15 ability to climb steps?  yes do you drive?  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  no  Physicians involved in your care Any changes since last visit?  no   Family History  Problem Relation Age of Onset  . Breast cancer Mother   . Diabetes Mother   . Heart disease Mother   . Allergies Mother   . Diabetes Father   . Heart disease Father   . Emphysema Father   . Allergies Father   . Stomach cancer Maternal Uncle   . Breast cancer Paternal Aunt   . Colon cancer Neg Hx    Social History   Social History  . Marital status: Married    Spouse name:  N/A  . Number of children: N/A  . Years of education: N/A   Occupational History  . Bookkeeper    Social History Main Topics  . Smoking status: Never Smoker  . Smokeless tobacco: Never Used  . Alcohol use No  . Drug use: No  . Sexual activity: Not Asked   Other Topics Concern  . None   Social History Narrative  . None   Past Surgical History:  Procedure Laterality Date  . [place] STUDY N/A 01/04/2016   Procedure: [place] STUDY;  Surgeon: Manus Gunning, MD;  [facility]: [facility];  Service: Gastroenterology;  Laterality: N/A;  . ABDOMINAL HYSTERECTOMY  1988  . ADENOIDECTOMY    . ANTERIOR CERVICAL DECOMP/DISCECTOMY FUSION  2009, 1993   first procedure in ~ 1993/Dr Botero  . APPENDECTOMY  1988  . BACK SURGERY    . CHOLECYSTECTOMY  2005  . ESOPHAGEAL MANOMETRY N/A 01/04/2016   Procedure: ESOPHAGEAL MANOMETRY (EM) 24 HR PH;  Surgeon: Manus Gunning, MD;  [facility];  Service: Gastroenterology;  Laterality: N/A;  . ESOPHAGOGASTRODUODENOSCOPY (EGD) WITH PROPOFOL N/A 05/17/2016   Procedure: ESOPHAGOGASTRODUODENOSCOPY (EGD) WITH PROPOFOL;  Surgeon: Manus Gunning, MD;  [facility];  Service: Gastroenterology;  Laterality: N/A;  . NASAL SINUS SURGERY  2006  . SAVORY DILATION N/A 05/17/2016   Procedure: SAVORY DILATION;  Surgeon:  Manus Gunning, MD;  Location: [facility];  Service: Gastroenterology;  Laterality: N/A;  . TONSILLECTOMY AND ADENOIDECTOMY    . TRANSTHORACIC ECHOCARDIOGRAM  2009   Normal LV size and function. EF 60-65%. No regional wall motion and amount is. Mild aortic sclerosis, no stenosis  . TUBAL LIGATION  1979   Past Medical History:  Diagnosis Date  . Arthritis   . B12 deficiency   . Complication of anesthesia    slow to wake up   . Depression   . Diabetes mellitus without complication ([place])    type II - diet controlled   . Fibromyalgia   . Generalized hyperhidrosis 04/17/2014  . GERD  (gastroesophageal reflux disease)   . Heart murmur    Aortic Sclerosis  . Hyperlipemia   . HYPOTHYROIDISM 05/29/2007  . Irritable bowel syndrome   . Migraines    hx of   . 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 PONV (postoperative nausea and vomiting)   . PVC (premature ventricular contraction)   . Type II or unspecified type diabetes mellitus with neurological manifestations, not stated as uncontrolled(250.60) 04/07/2013  . Unspecified essential hypertension    denies htn on 05/09/16   BP 129/78   Pulse 68   SpO2 95%   Opioid Risk Score:   Fall Risk Score:  `1  Depression screen PHQ 2/9  Depression screen [facility] 2/9 10/26/2016 07/04/2016 02/03/2016 10/23/2015 10/05/2015 02/04/2015 09/11/2014  Decreased Interest 1 0 0 2 2 0 2  Down, Depressed, Hopeless 1 0 0 0 0 0 0  PHQ - 2 Score 2 0 0 2 2 0 2  Altered sleeping - - - - 3 - 0  Tired, decreased energy - - - - 1 - 2  Change in appetite - - - - 0 - 0  Feeling bad or failure about yourself  - - - - 0 - 0  Trouble concentrating - - - - 1 - 1  Moving slowly or fidgety/restless - - - - 0 - 0  Suicidal thoughts - - - - 0 - 0  PHQ-9 Score - - - - 7 - 5  Difficult doing work/chores - - - - Somewhat difficult - -  Some recent data might be hidden    Review of Systems  HENT: Negative.   Eyes: Negative.   Respiratory: Negative.   Cardiovascular: Positive for leg swelling.  Gastrointestinal: Positive for constipation.  Endocrine: Negative.   Genitourinary: Negative.   Musculoskeletal: Negative.   Skin: Negative.   Allergic/Immunologic: Negative.   Neurological: Positive for weakness and numbness.  Hematological: Negative.   Psychiatric/Behavioral: Positive for dysphoric mood.  All other systems reviewed and are negative.      Objective:   Physical Exam  Constitutional: She is oriented to person, place, and time. She appears well-developed and well-nourished.  HENT:  Head: Normocephalic and atraumatic.    Neck: Normal range of motion. Neck supple.  Cardiovascular: Normal rate and regular rhythm.   Pulmonary/Chest: Effort normal and breath sounds normal.  Musculoskeletal:  Normal Muscle Bulk and Muscle Testing Reveals: Upper Extremities: Full ROM and Muscle Strength 5/5 Thoracic Paraspinal Tenderness: T-7-T-9 Lower Extremities: Full ROM and Muscle Strength 5/5 Bilateral Lower Extremities Flexion Produces Pain into Patella's Arises from Table with ease Narrow Based Gait  Neurological: She is alert and oriented to person, place, and time.  Skin: Skin is warm and dry.  Psychiatric: She has a normal mood  and affect.  Nursing note and vitals reviewed.         Assessment & Plan:  1. Cervical postlaminectomy syndrome: with neck and shoulder pain. 10/26/2016. Continue exercise routine and using heat therapy. Continue MS Contin 15 mg one tablet three times a day #90.  We will continue the opioid monitoring program, this consists of regular clinic visits, examinations, urine drug screen, pill counts as well as use of [place] Controlled Substance reporting System. 2. Fibromyalgia: Continue with Gabapentin, activity and exercise regime. 10/26/2016. 3. Right Shoulder Pain: No complaints today.Continue HEP and alternate heat and ice therapy. 10/26/2016 4. Bilateral Knee Pain/ Mild Degenerative Changes:.Continue Voltaren Gel. 10/26/2016 5. Right Ankle Pain: Continue HEP as tolerated. 10/26/2016  20  minutes of face to face patient care time was spent during this visit. All questions were encouraged and answered.  F/U in 1 month

## 2016-11-21 DIAGNOSIS — J9610 Chronic respiratory failure, unspecified whether with hypoxia or hypercapnia: Secondary | ICD-10-CM | POA: Diagnosis not present

## 2016-11-24 ENCOUNTER — Other Ambulatory Visit: Payer: Self-pay | Admitting: Registered Nurse

## 2016-11-28 ENCOUNTER — Encounter: Payer: Medicare Other | Attending: Physical Medicine & Rehabilitation

## 2016-11-28 ENCOUNTER — Encounter: Payer: Self-pay | Admitting: Physical Medicine & Rehabilitation

## 2016-11-28 ENCOUNTER — Ambulatory Visit (HOSPITAL_BASED_OUTPATIENT_CLINIC_OR_DEPARTMENT_OTHER): Payer: Medicare Other | Admitting: Physical Medicine & Rehabilitation

## 2016-11-28 VITALS — BP 129/77 | HR 63

## 2016-11-28 DIAGNOSIS — G8929 Other chronic pain: Secondary | ICD-10-CM

## 2016-11-28 DIAGNOSIS — E1140 Type 2 diabetes mellitus with diabetic neuropathy, unspecified: Secondary | ICD-10-CM | POA: Diagnosis not present

## 2016-11-28 DIAGNOSIS — G894 Chronic pain syndrome: Secondary | ICD-10-CM | POA: Insufficient documentation

## 2016-11-28 DIAGNOSIS — Z5181 Encounter for therapeutic drug level monitoring: Secondary | ICD-10-CM | POA: Insufficient documentation

## 2016-11-28 DIAGNOSIS — Z79899 Other long term (current) drug therapy: Secondary | ICD-10-CM | POA: Insufficient documentation

## 2016-11-28 DIAGNOSIS — M961 Postlaminectomy syndrome, not elsewhere classified: Secondary | ICD-10-CM | POA: Diagnosis not present

## 2016-11-28 DIAGNOSIS — M546 Pain in thoracic spine: Secondary | ICD-10-CM

## 2016-11-28 MED ORDER — MORPHINE SULFATE ER 15 MG PO TBCR: 15.0000 mg | EXTENDED_RELEASE_TABLET | Freq: Three times a day (TID) | ORAL | 0 refills | Status: DC

## 2016-11-28 NOTE — Progress Notes (Signed)
Subjective:    Patient ID: [name], female    DOB: 01/31/1951, 66 y.o.   MRN: [id number]  HPI CC  Midback pain  Trying to get off gabapentin which she was taking for nerve pain, could not get below 300mg  TID  Stays constipated despite senna  2-3 tabs per night Also on Colace 3 tablets  Bowels move every few days  Pain Inventory Average Pain 6 Pain Right Now 6 My pain is constant  In the last 24 hours, has pain interfered with the following? General activity 6 Relation with others 6 Enjoyment of life 6 What TIME of day is your pain at its worst? evening Sleep (in general) Fair  Pain is worse with: walking, bending, sitting, standing and some activites Pain improves with: rest, heat/ice, therapy/exercise, pacing activities, medication, TENS and injections Relief from Meds: 5  Mobility walk without assistance ability to climb steps?  yes do you drive?  yes  Function employed # of hrs/week 12 disabled: date disabled 2010  Neuro/Psych bladder control problems weakness numbness tremor tingling depression  Prior Studies Any changes since last visit?  no  Physicians involved in your care Any changes since last visit?  no   Family History  Problem Relation Age of Onset  . Breast cancer Mother   . Diabetes Mother   . Heart disease Mother   . Allergies Mother   . Diabetes Father   . Heart disease Father   . Emphysema Father   . Allergies Father   . Stomach cancer Maternal Uncle   . Breast cancer Paternal Aunt   . Colon cancer Neg Hx    Social History   Social History  . Marital status: Married    Spouse name: N/A  . Number of children: N/A  . Years of education: N/A   Occupational History  . Bookkeeper    Social History Main Topics  . Smoking status: Never Smoker  . Smokeless tobacco: Never Used  . Alcohol use No  . Drug use: No  . Sexual activity: Not on file   Other Topics Concern  . Not on file   Social History Narrative  . No  narrative on file   Past Surgical History:  Procedure Laterality Date  . [place] STUDY N/A 01/04/2016   Procedure: [place] STUDY;  Surgeon: Manus Gunning, MD;  [facility];  Service: Gastroenterology;  Laterality: N/A;  . ABDOMINAL HYSTERECTOMY  1988  . ADENOIDECTOMY    . ANTERIOR CERVICAL DECOMP/DISCECTOMY FUSION  2009, 1993   first procedure in ~ 1993/Dr Botero  . APPENDECTOMY  1988  . BACK SURGERY    . CHOLECYSTECTOMY  2005  . ESOPHAGEAL MANOMETRY N/A 01/04/2016   Procedure: ESOPHAGEAL MANOMETRY (EM) 24 HR PH;  Surgeon: Manus Gunning, MD;  [facility];  Service: Gastroenterology;  Laterality: N/A;  . ESOPHAGOGASTRODUODENOSCOPY (EGD) WITH PROPOFOL N/A 05/17/2016   Procedure: ESOPHAGOGASTRODUODENOSCOPY (EGD) WITH PROPOFOL;  Surgeon: Manus Gunning, MD;  [facility];  Service: Gastroenterology;  Laterality: N/A;  . NASAL SINUS SURGERY  2006  . SAVORY DILATION N/A 05/17/2016   Procedure: SAVORY DILATION;  Surgeon: Manus Gunning, MD;  Location: [facility];  Service: Gastroenterology;  Laterality: N/A;  . TONSILLECTOMY AND ADENOIDECTOMY    . TRANSTHORACIC ECHOCARDIOGRAM  2009   Normal LV size and function. EF 60-65%. No regional wall motion and amount is. Mild aortic sclerosis, no stenosis  . [place]  Past Medical History:  Diagnosis Date  . Arthritis   . B12 deficiency   . Complication of anesthesia    slow to wake up   . Depression   . Diabetes mellitus without complication ([place])    type II - diet controlled   . Fibromyalgia   . Generalized hyperhidrosis 04/17/2014  . GERD (gastroesophageal reflux disease)   . Heart murmur    Aortic Sclerosis  . Hyperlipemia   . HYPOTHYROIDISM 05/29/2007  . Irritable bowel syndrome   . Migraines    hx of   . 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 PONV (postoperative nausea and vomiting)   . PVC (premature ventricular  contraction)   . Type II or unspecified type diabetes mellitus with neurological manifestations, not stated as uncontrolled(250.60) 04/07/2013  . Unspecified essential hypertension    denies htn on 05/09/16   There were no vitals taken for this visit.  Opioid Risk Score:   Fall Risk Score:  `1  Depression screen PHQ 2/9  Depression screen [facility] 2/9 10/26/2016 07/04/2016 02/03/2016 10/23/2015 10/05/2015 02/04/2015 09/11/2014  Decreased Interest 1 0 0 2 2 0 2  Down, Depressed, Hopeless 1 0 0 0 0 0 0  PHQ - 2 Score 2 0 0 2 2 0 2  Altered sleeping - - - - 3 - 0  Tired, decreased energy - - - - 1 - 2  Change in appetite - - - - 0 - 0  Feeling bad or failure about yourself  - - - - 0 - 0  Trouble concentrating - - - - 1 - 1  Moving slowly or fidgety/restless - - - - 0 - 0  Suicidal thoughts - - - - 0 - 0  PHQ-9 Score - - - - 7 - 5  Difficult doing work/chores - - - - Somewhat difficult - -  Some recent data might be hidden    Review of Systems  Constitutional: Negative.   HENT: Negative.   Eyes: Negative.   Respiratory: Negative.   Cardiovascular: Negative.   Gastrointestinal: Negative.   Endocrine: Negative.   Genitourinary: Negative.   Musculoskeletal: Negative.   Skin: Negative.   Allergic/Immunologic: Negative.   Neurological: Negative.   Hematological: Negative.   Psychiatric/Behavioral: Negative.   All other systems reviewed and are negative.      Objective:   Physical Exam  Constitutional: She is oriented to person, place, and time. She appears well-developed and well-nourished.  HENT:  Head: Normocephalic and atraumatic.  Eyes: Conjunctivae are normal. Pupils are equal, round, and reactive to light.  Neurological: She is alert and oriented to person, place, and time. No sensory deficit. Gait normal.  Motor strength is 5/5 bilateral deltoids by stress, grip, hip flexor, knee extensor, ankle dorsal flexor  Psychiatric: She has a normal mood and affect.  Nursing note and  vitals reviewed.   Twisting to right is painful in the thoracic spine. Thoracic spine pain around T7-T10 with palpation as well as with movement No pain over the spinous processes. There is some upper thoracic pain with neck range of motion      Assessment & Plan:   1.  Mid back Pain. This is a combination between some referred pain from the cervical spine, as well as myofascial pain. Would recommend rowing exercises with therabands  2.  OIC trial Symproic samples iven failure of both docusate sodium and Psyllium  3.  Cervical post  lami- continue MS Contin 15 mg 3 times a day no signs of aberrant drug behavior Continue opioid monitoring program. This consists of regular clinic visits, examinations, urine drug screen, pill counts as well as use of [place] controlled substance reporting System.  . 4. Painful diabetic neuropathy. Continue gabapentin. She has been able to wean down from 600 mg 300 mg 3 times a day we'll continue that dose.

## 2016-11-28 NOTE — Patient Instructions (Signed)
Trial Symproic one table per day

## 2016-11-28 NOTE — Progress Notes (Signed)
#  4 boxes of Symproic (#7 pills each) given to pt.  Educated by MD on use. Instructions/precautions incuded.

## 2016-12-01 ENCOUNTER — Other Ambulatory Visit: Payer: Self-pay | Admitting: Registered Nurse

## 2016-12-01 NOTE — Telephone Encounter (Signed)
Recieved electronic medication refill request for methocarbamol, medicine not been prescribed since July of 2017, no mention as to continue this medication, please advise

## 2016-12-05 ENCOUNTER — Telehealth: Payer: Self-pay | Admitting: Registered Nurse

## 2016-12-05 ENCOUNTER — Encounter: Payer: Self-pay | Admitting: Registered Nurse

## 2016-12-05 NOTE — Telephone Encounter (Signed)
Return, Ms. [facility] call, no answer. Left message to return the call.

## 2016-12-06 ENCOUNTER — Telehealth: Payer: Self-pay | Admitting: *Deleted

## 2016-12-06 ENCOUNTER — Other Ambulatory Visit: Payer: Self-pay | Admitting: *Deleted

## 2016-12-06 ENCOUNTER — Telehealth: Payer: Self-pay | Admitting: Registered Nurse

## 2016-12-06 MED ORDER — METHOCARBAMOL 500 MG PO TABS: ORAL_TABLET | ORAL | 3 refills | Status: DC

## 2016-12-06 NOTE — Telephone Encounter (Signed)
Placed a call to Ms. Popl[place]i, Robaxin ordered, she is paying cash for this medication. She verbalizes understanding.

## 2016-12-06 NOTE — Telephone Encounter (Signed)
Prior authorization submitted to [facility] for Methocarbamol 500 mg

## 2016-12-22 DIAGNOSIS — J9610 Chronic respiratory failure, unspecified whether with hypoxia or hypercapnia: Secondary | ICD-10-CM | POA: Diagnosis not present

## 2016-12-26 ENCOUNTER — Encounter: Payer: Self-pay | Admitting: Registered Nurse

## 2016-12-26 ENCOUNTER — Encounter: Payer: Medicare Other | Attending: Physical Medicine & Rehabilitation | Admitting: Registered Nurse

## 2016-12-26 ENCOUNTER — Telehealth: Payer: Self-pay | Admitting: Registered Nurse

## 2016-12-26 VITALS — BP 118/73 | HR 70 | Resp 14

## 2016-12-26 DIAGNOSIS — M797 Fibromyalgia: Secondary | ICD-10-CM | POA: Diagnosis not present

## 2016-12-26 DIAGNOSIS — M961 Postlaminectomy syndrome, not elsewhere classified: Secondary | ICD-10-CM | POA: Diagnosis not present

## 2016-12-26 DIAGNOSIS — G609 Hereditary and idiopathic neuropathy, unspecified: Secondary | ICD-10-CM | POA: Diagnosis not present

## 2016-12-26 DIAGNOSIS — Z79899 Other long term (current) drug therapy: Secondary | ICD-10-CM

## 2016-12-26 DIAGNOSIS — Z5181 Encounter for therapeutic drug level monitoring: Secondary | ICD-10-CM | POA: Diagnosis not present

## 2016-12-26 DIAGNOSIS — G894 Chronic pain syndrome: Secondary | ICD-10-CM | POA: Diagnosis not present

## 2016-12-26 DIAGNOSIS — M545 Low back pain, unspecified: Secondary | ICD-10-CM

## 2016-12-26 MED ORDER — NALDEMEDINE TOSYLATE 0.2 MG PO TABS: 0.2000 | ORAL_TABLET | Freq: Every day | ORAL | 2 refills | Status: DC

## 2016-12-26 MED ORDER — GABAPENTIN 300 MG PO CAPS: 300.0000 mg | ORAL_CAPSULE | Freq: Three times a day (TID) | ORAL | 3 refills | Status: DC

## 2016-12-26 MED ORDER — MORPHINE SULFATE ER 15 MG PO TBCR: 15.0000 mg | EXTENDED_RELEASE_TABLET | Freq: Three times a day (TID) | ORAL | 0 refills | Status: DC

## 2016-12-26 NOTE — Progress Notes (Addendum)
Subjective:    Patient ID: [name], female    DOB: 1950/10/10, 66 y.o.   MRN: [id number]  HPI: [name] is a 66 year old female who returns for follow up appointmentfor chronic pain and medication refill. She states her pain is located in her neck, right shoulder and mid back.She rates her pain 5. Her current exercise regime is walking and using bands.  Her last UDS was on 09/27/2016, it was consistent.    Pain Inventory Average Pain 5 Pain Right Now 5 My pain is constant, burning and aching  In the last 24 hours, has pain interfered with the following? General activity 6 Relation with others 6 Enjoyment of life 6 What TIME of day is your pain at its worst? daytime, evening Sleep (in general) Fair  Pain is worse with: walking, bending, sitting, inactivity, standing and some activites Pain improves with: rest, heat/ice, therapy/exercise, pacing activities, medication, TENS and injections Relief from Meds: 6  Mobility walk without assistance how many minutes can you walk? 10-15 ability to climb steps?  yes do you drive?  yes Do you have any goals in this area?  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 I need assistance with the following:  meal prep, household duties and shopping Do you have any goals in this area?  yes  Neuro/Psych bladder control problems weakness tremor depression  Prior Studies Any changes since last visit?  no  Physicians involved in your care Any changes since last visit?  no   Family History  Problem Relation Age of Onset  . Breast cancer Mother   . Diabetes Mother   . Heart disease Mother   . Allergies Mother   . Diabetes Father   . Heart disease Father   . Emphysema Father   . Allergies Father   . Stomach cancer Maternal Uncle   . Breast cancer Paternal Aunt   . Colon cancer Neg Hx    Social History   Social History  . Marital status: Married   Spouse name: N/A  . Number of children: N/A  . Years of education: N/A   Occupational History  . Bookkeeper    Social History Main Topics  . Smoking status: Never Smoker  . Smokeless tobacco: Never Used  . Alcohol use No  . Drug use: No  . Sexual activity: Not Asked   Other Topics Concern  . None   Social History Narrative  . None   Past Surgical History:  Procedure Laterality Date  . [place] STUDY N/A 01/04/2016   Procedure: [place] STUDY;  Surgeon: Manus Gunning, MD;  [facility];  Service: Gastroenterology;  Laterality: N/A;  . ABDOMINAL HYSTERECTOMY  1988  . ADENOIDECTOMY    . ANTERIOR CERVICAL DECOMP/DISCECTOMY FUSION  2009, 1993   first procedure in ~ 1993/Dr Botero  . APPENDECTOMY  1988  . BACK SURGERY    . CHOLECYSTECTOMY  2005  . ESOPHAGEAL MANOMETRY N/A 01/04/2016   Procedure: ESOPHAGEAL MANOMETRY (EM) 24 HR PH;  Surgeon: Manus Gunning, MD;  [facility];  Service: Gastroenterology;  Laterality: N/A;  . ESOPHAGOGASTRODUODENOSCOPY (EGD) WITH PROPOFOL N/A 05/17/2016   Procedure: ESOPHAGOGASTRODUODENOSCOPY (EGD) WITH PROPOFOL;  Surgeon: Manus Gunning, MD;  [facility];  Service: Gastroenterology;  Laterality: N/A;  . NASAL SINUS SURGERY  2006  . SAVORY DILATION N/A 05/17/2016   Procedure: SAVORY DILATION;  Surgeon:  Manus Gunning, MD;  Location: [facility];  Service: Gastroenterology;  Laterality: N/A;  . TONSILLECTOMY AND ADENOIDECTOMY    . TRANSTHORACIC ECHOCARDIOGRAM  2009   Normal LV size and function. EF 60-65%. No regional wall motion and amount is. Mild aortic sclerosis, no stenosis  . TUBAL LIGATION  1979   Past Medical History:  Diagnosis Date  . Arthritis   . B12 deficiency   . Complication of anesthesia    slow to wake up   . Depression   . Diabetes mellitus without complication ([place])    type II - diet controlled   . Fibromyalgia   . Generalized hyperhidrosis 04/17/2014  .  GERD (gastroesophageal reflux disease)   . Heart murmur    Aortic Sclerosis  . Hyperlipemia   . HYPOTHYROIDISM 05/29/2007  . Irritable bowel syndrome   . Migraines    hx of   . 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 PONV (postoperative nausea and vomiting)   . PVC (premature ventricular contraction)   . Type II or unspecified type diabetes mellitus with neurological manifestations, not stated as uncontrolled(250.60) 04/07/2013  . Unspecified essential hypertension    denies htn on 05/09/16   BP 118/73 (BP Location: Left Arm, Patient Position: Sitting, Cuff Size: Large)   Pulse 70   Resp 14   SpO2 97%   Opioid Risk Score:   Fall Risk Score:  `1  Depression screen PHQ 2/9  Depression screen [facility] 2/9 10/26/2016 07/04/2016 02/03/2016 10/23/2015 10/05/2015 02/04/2015 09/11/2014  Decreased Interest 1 0 0 2 2 0 2  Down, Depressed, Hopeless 1 0 0 0 0 0 0  PHQ - 2 Score 2 0 0 2 2 0 2  Altered sleeping - - - - 3 - 0  Tired, decreased energy - - - - 1 - 2  Change in appetite - - - - 0 - 0  Feeling bad or failure about yourself  - - - - 0 - 0  Trouble concentrating - - - - 1 - 1  Moving slowly or fidgety/restless - - - - 0 - 0  Suicidal thoughts - - - - 0 - 0  PHQ-9 Score - - - - 7 - 5  Difficult doing work/chores - - - - Somewhat difficult - -  Some recent data might be hidden    Review of Systems  Respiratory: Negative.   Cardiovascular: Positive for leg swelling.  Gastrointestinal: Positive for constipation.  Endocrine: Negative.   Genitourinary: Positive for urgency.  Musculoskeletal: Positive for back pain and neck pain.  Skin: Negative.   Allergic/Immunologic: Negative.   Neurological: Positive for tremors and weakness.  Hematological: Negative.   Psychiatric/Behavioral: Negative.   All other systems reviewed and are negative.      Objective:   Physical Exam  Constitutional: She is oriented to person, place, and time. She appears well-developed and  well-nourished.  HENT:  Head: Normocephalic and atraumatic.  Neck: Normal range of motion. Neck supple.  Cardiovascular: Normal rate and regular rhythm.   Pulmonary/Chest: Effort normal and breath sounds normal.  Musculoskeletal:  Normal Muscle Bulk and Muscle Testing Reveals: Upper Extremities: Full ROM and Muscle Strength 5/5 Right AC Joint Tenderness Thoracic Paraspinal Tenderness: T-7-T-9 Lower Extremities: Full ROM and Muscle Strength 5/5 Arises from chair with ease  Narrow Based Gait  Neurological: She is alert and oriented to person, place, and time.  Skin: Skin is warm and dry.  Psychiatric: She has a normal mood and affect.  Nursing note and vitals reviewed.         Assessment & Plan:  1. Cervical postlaminectomy syndrome: with neck and shoulder pain. 12/26/2016. Continue exercise routine and using heat therapy. Continue MS Contin 15 mg one tablet three times a day #90.  We will continue the opioid monitoring program, this consists of regular clinic visits, examinations, urine drug screen, pill counts as well as use of [place] Controlled Substance reporting System. 2. Fibromyalgia/ Neuropathy: Continue with Gabapentin, activity and exercise regime. 12/26/2016. 3. Right Shoulder Pain: Continue HEP and alternate heat and ice therapy. 12/26/2016 4. Bilateral Knee Pain/ Mild Degenerative Changes:.Continue Voltaren Gel. 12/26/2016 5. Right Ankle Pain: No Complaints Today. Continue HEP. 12/26/2016 6. Muscle Spasm: Continue Robaxin. 12/26/2016  20 minutes of face to face patient care time was spent during this visit. All questions were encouraged and answered.   F/U in 1 month

## 2016-12-26 NOTE — Progress Notes (Signed)
PCP notes:   Health maintenance: Foot exam- due  Eye exam- completed w/ Dr. Marvel Plan. No notes in chart and unable to get in touch with their office.  DEXA- Never done.  Colonoscopy- last 10/20/2006  Prevnar-13 given today.   Abnormal screenings: None   Patient concerns: None   Nurse concerns: None   Next PCP appt: 01/09/17 @ 2:45

## 2016-12-26 NOTE — Telephone Encounter (Signed)
On 12/26/2016 the [place] was reviewed no conflict was seen on the [place] with multiple prescribers. [name] has a signed narcotic contract with our office. If there were any discrepancies this would have been reported to her physician.

## 2016-12-26 NOTE — Progress Notes (Signed)
Subjective:   [name] is a 66 y.o. female who presents for Medicare Annual (Subsequent) preventive examination.  Review of Systems:  No ROS.  Medicare Wellness Visit. Additional risk factors are reflected in the social history.  Cardiac Risk Factors include: advanced age (>96men, >48 women);diabetes mellitus;dyslipidemia;obesity (BMI >30kg/m2);hypertension     Objective:     Vitals: BP 130/78   Pulse 70   Resp 16   Ht 5\' 5"  (1.651 m)   Wt 229 lb (103.9 kg)   SpO2 98%   BMI 38.11 kg/m   Body mass index is 38.11 kg/m.   Tobacco History  Smoking Status  . Never Smoker  Smokeless Tobacco  . Never Used     Counseling given: Not Answered   Past Medical History:  Diagnosis Date  . Arthritis   . B12 deficiency   . Complication of anesthesia    slow to wake up   . Depression   . Diabetes mellitus without complication ([place])    type II - diet controlled   . Fibromyalgia   . Generalized hyperhidrosis 04/17/2014  . GERD (gastroesophageal reflux disease)   . Heart murmur    Aortic Sclerosis  . Hyperlipemia   . HYPOTHYROIDISM 05/29/2007  . Irritable bowel syndrome   . Migraines    hx of   . 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 PONV (postoperative nausea and vomiting)   . PVC (premature ventricular contraction)   . Type II or unspecified type diabetes mellitus with neurological manifestations, not stated as uncontrolled(250.60) 04/07/2013  . Unspecified essential hypertension    denies htn on 05/09/16   Past Surgical History:  Procedure Laterality Date  . [place] STUDY N/A 01/04/2016   Procedure: [place] STUDY;  Surgeon: Manus Gunning, MD;  Location: [facility];  Service: Gastroenterology;  Laterality: N/A;  . ABDOMINAL HYSTERECTOMY  1988  . ADENOIDECTOMY    . ANTERIOR CERVICAL DECOMP/DISCECTOMY FUSION  2009, 1993   first procedure in ~ 1993/Dr Botero  . APPENDECTOMY  1988  . BACK SURGERY    .  CHOLECYSTECTOMY  2005  . ESOPHAGEAL MANOMETRY N/A 01/04/2016   Procedure: ESOPHAGEAL MANOMETRY (EM) 24 HR PH;  Surgeon: Manus Gunning, MD;  Location: [facility];  Service: Gastroenterology;  Laterality: N/A;  . ESOPHAGOGASTRODUODENOSCOPY (EGD) WITH PROPOFOL N/A 05/17/2016   Procedure: ESOPHAGOGASTRODUODENOSCOPY (EGD) WITH PROPOFOL;  Surgeon: Manus Gunning, MD;  Location: [facility];  Service: Gastroenterology;  Laterality: N/A;  . NASAL SINUS SURGERY  2006  . SAVORY DILATION N/A 05/17/2016   Procedure: SAVORY DILATION;  Surgeon: Manus Gunning, MD;  Location: [facility];  Service: Gastroenterology;  Laterality: N/A;  . TONSILLECTOMY AND ADENOIDECTOMY    . TRANSTHORACIC ECHOCARDIOGRAM  2009   Normal LV size and function. EF 60-65%. No regional wall motion and amount is. Mild aortic sclerosis, no stenosis  . TUBAL LIGATION  1979   Family History  Problem Relation Age of Onset  . Breast cancer Mother   . Diabetes Mother   . Heart disease Mother   . Allergies Mother   . Diabetes Father   . Heart disease Father   . Emphysema Father   . Allergies Father   . Stomach cancer Maternal Uncle   . Breast cancer Paternal Aunt   . Colon cancer Neg Hx    History  Sexual Activity  . Sexual activity: Not on file  Outpatient Encounter Prescriptions as of 01/02/2017  Medication Sig  . ACCU-CHEK SOFTCLIX LANCETS lancets CHECK BLOOD SUGAR DAILY AS  DIRECTED  . aspirin 81 MG tablet Take 81 mg by mouth daily.    . B Complex Vitamins (B COMPLEX 100 PO) Take 1 tablet by mouth daily.  . Calcium Carbonate (CALCIUM 600 PO) Take 600 mg by mouth 2 (two) times daily.   . carboxymethylcellulose (REFRESH PLUS) 0.5 % SOLN Place 1 drop into both eyes 3 (three) times daily as needed.   . Cholecalciferol (VITAMIN D) 2000 UNITS tablet Take 2,000 Units by mouth daily.    . citalopram (CELEXA) 40 MG tablet TAKE 1 TABLET BY MOUTH  DAILY  . Coenzyme Q-10 100 MG capsule Take 100 mg by  mouth daily.  . diclofenac sodium (VOLTAREN) 1 % GEL Apply 4 g topically 4 (four) times daily. (Patient taking differently: Apply 4 g topically 4 (four) times daily as needed. )  . gabapentin (NEURONTIN) 300 MG capsule Take 1 capsule (300 mg total) by mouth 3 (three) times daily.  Marland Kitchen GRAPE SEED EXTRACT PO Take by mouth. 1 tablet daily  . levothyroxine (SYNTHROID, LEVOTHROID) 50 MCG tablet TAKE 1 TABLET BY MOUTH  DAILY  . loratadine (CLARITIN) 10 MG tablet Take 10 mg by mouth daily.  Marland Kitchen MAGNESIUM GLYCINATE PLUS PO Take 400 mg by mouth at bedtime.  . methocarbamol (ROBAXIN) 500 MG tablet TAKE ONE (1) TABLET BY MOUTH EVERY EIGHTHOURS AS NEEDED FOR MUSCLE SPASMS  . mirtazapine (REMERON) 15 MG tablet TAKE 1 TABLET BY MOUTH AT  BEDTIME  . morphine (MS CONTIN) 15 MG 12 hr tablet Take 1 tablet (15 mg total) by mouth 3 (three) times daily.  . mupirocin ointment (BACTROBAN) 2 % PLACE 1 APPLICATION INTO THE NOSE 2 TIMES DAILY FOR 1 WEEK (Patient taking differently: PLACE 1 APPLICATION INTO THE NOSE 2 TIMES DAILY FOR 1 WEEK As needed for boils per patient)  . Naldemedine Tosylate 0.2 MG TABS Take 0.2 tablets by mouth daily. (Patient taking differently: Take 1 tablet by mouth daily. )  . NON FORMULARY Daith piercing in right ear for headaches  . oxybutynin (DITROPAN-XL) 10 MG 24 hr tablet TAKE ONE TABLET BY MOUTH EVERY NIGHT AT BEDTIME  . RABEprazole (ACIPHEX) 20 MG tablet Take 1 tablet (20 mg total) by mouth 2 (two) times daily.  . ranitidine (ZANTAC) 150 MG capsule Take 150 mg by mouth 2 (two) times daily.  . TURMERIC PO Take 1 capsule by mouth daily.  . [DISCONTINUED] citalopram (CELEXA) 40 MG tablet TAKE 1 TABLET BY MOUTH  DAILY  . [DISCONTINUED] gabapentin (NEURONTIN) 600 MG tablet TAKE 1 TABLET BY MOUTH THREE TIMES A DAY  . [DISCONTINUED] magnesium gluconate (MAGONATE) 500 MG tablet Take 1,000 mg by mouth at bedtime.   . [DISCONTINUED] mirtazapine (REMERON) 15 MG tablet TAKE 1 TABLET BY MOUTH AT  BEDTIME   . [DISCONTINUED] OVER THE COUNTER MEDICATION Take 1 tablet by mouth daily as needed. gaviscom   No facility-administered encounter medications on file as of 01/02/2017.     Activities of Daily Living In your present state of health, do you have any difficulty performing the following activities: 01/02/2017  Hearing? N  Vision? N  Difficulty concentrating or making decisions? N  Walking or climbing stairs? N  Dressing or bathing? N  Doing errands, shopping? N  Preparing Food and eating ? N  Using the Toilet? N  In the past six months, have you accidently leaked urine? N  Do you have problems with loss of bowel control? N  Managing your Medications? N  Managing your Finances? N  Housekeeping or managing your Housekeeping? N  Some recent data might be hidden    Patient Care Team: Owens Loffler, MD as PCP - General Kirsteins, Luanna Salk, MD as Consulting Physician (Physical Medicine and Rehabilitation) Armbruster, Renelda Loma, MD as Consulting Physician (Gastroenterology) [name], MD as Consulting Physician (Cardiology) Chesley Mires, MD as Consulting Physician (Pulmonary Disease)    Assessment:    Physical assessment deferred to PCP.  Exercise Activities and Dietary recommendations Current Exercise Habits: Home exercise routine, Type of exercise: strength training/weights, Time (Minutes): 15, Frequency (Times/Week): 7, Weekly Exercise (Minutes/Week): 105  Goals    . Increase physical activity          Starting 01/02/2017, I will increase my activity as tolerated and continue doing resistance band exercises daily.      Fall Risk Fall Risk  01/02/2017 12/26/2016 10/26/2016 08/29/2016 08/01/2016  Falls in the past year? Yes Yes Yes No Yes  Number falls in past yr: 1 1 1  - -  Injury with Fall? No No No - -  Risk for fall due to : - - - - -  Follow up - Falls prevention discussed Falls prevention discussed - -   Depression Screen PHQ 2/9 Scores 01/02/2017 10/26/2016 07/04/2016  02/03/2016  PHQ - 2 Score 0 2 0 0  PHQ- 9 Score - - - -     Cognitive Function PLEASE NOTE: A Mini-Cog screen was completed. Maximum score is 20. A value of 0 denotes this part of [place] MMSE was not completed or the patient failed this part of the Mini-Cog screening.   Mini-Cog Screening Orientation to Time - Max 5 pts Orientation to Place - Max 5 pts Registration - Max 3 pts Recall - Max 3 pts Language Repeat - Max 1 pts Language Follow 3 Step Command - Max 3 pts  MMSE - Mini Mental State Exam 01/02/2017  Orientation to time 5  Orientation to Place 5  Registration 3  Attention/ Calculation 0  Recall 3  Language- name 2 objects 0  Language- repeat 1  Language- follow 3 step command 3  Language- read & follow direction 0  Write a sentence 0  Copy design 0  Total score 20        Immunization History  Administered Date(s) Administered  . Pneumococcal Conjugate-13 01/02/2017  . Pneumococcal Polysaccharide-23 05/06/2013  . Td 06/28/2003, 10/16/2013  . Zoster 11/07/2012   Screening Tests Health Maintenance  Topic Date Due  . FOOT EXAM  08/23/2014  . OPHTHALMOLOGY EXAM  12/24/2014  . HEMOGLOBIN A1C  10/17/2016  . COLONOSCOPY  10/19/2016  . DEXA SCAN  11/01/2016  . INFLUENZA VACCINE  01/25/2017  . URINE MICROALBUMIN  04/18/2017  . PNA vac Low Risk Adult (2 of 2 - PPSV23) 05/06/2018  . MAMMOGRAM  06/15/2018  . TETANUS/TDAP  10/17/2023  . Hepatitis C Screening  Completed  . HIV Screening  Completed      Plan:    Follow-up w/ PCP as scheduled.   Attempted to contact Dr. Nena Alexander office for last eye exam results, no answer.  I have personally reviewed and noted the following in the patient's chart:   . Medical and social history . Use of alcohol, tobacco or illicit drugs  . Current medications and supplements . Functional ability and status . Nutritional status . Physical activity . Advanced directives . List  of other physicians . Vitals . Screenings to  include cognitive, depression, and falls . Referrals and appointments  In addition, I have reviewed and discussed with patient certain preventive protocols, quality metrics, and best practice recommendations. A written personalized care plan for preventive services as well as general preventive health recommendations were provided to patient.     Dorrene German, RN  01/02/2017

## 2016-12-29 ENCOUNTER — Telehealth: Payer: Self-pay | Admitting: *Deleted

## 2016-12-29 NOTE — Telephone Encounter (Signed)
Prior authorization for [employer] has been Approved.

## 2016-12-31 LAB — 6-ACETYLMORPHINE,TOXASSURE ADD
6-ACETYLMORPHINE: NEGATIVE
6-acetylmorphine: NOT DETECTED ng/mg creat

## 2016-12-31 LAB — TOXASSURE SELECT,+ANTIDEPR,UR

## 2017-01-02 ENCOUNTER — Other Ambulatory Visit: Payer: Self-pay | Admitting: Family Medicine

## 2017-01-02 ENCOUNTER — Ambulatory Visit (INDEPENDENT_AMBULATORY_CARE_PROVIDER_SITE_OTHER): Payer: Medicare Other

## 2017-01-02 ENCOUNTER — Other Ambulatory Visit (INDEPENDENT_AMBULATORY_CARE_PROVIDER_SITE_OTHER): Payer: Medicare Other

## 2017-01-02 VITALS — BP 130/78 | HR 70 | Resp 16 | Ht 65.0 in | Wt 229.0 lb

## 2017-01-02 DIAGNOSIS — Z0000 Encounter for general adult medical examination without abnormal findings: Secondary | ICD-10-CM | POA: Diagnosis not present

## 2017-01-02 DIAGNOSIS — Z23 Encounter for immunization: Secondary | ICD-10-CM | POA: Diagnosis not present

## 2017-01-02 LAB — BASIC METABOLIC PANEL
BUN: 14 mg/dL (ref 6–23)
CO2: 33 mEq/L — ABNORMAL HIGH (ref 19–32)
Calcium: 9.4 mg/dL (ref 8.4–10.5)
Chloride: 101 mEq/L (ref 96–112)
Creatinine, Ser: 0.83 mg/dL (ref 0.40–1.20)
GFR: 73.29 mL/min (ref >60.00)
Glucose, Bld: 97 mg/dL (ref 70–99)
Potassium: 4 mEq/L (ref 3.5–5.1)
Sodium: 140 mEq/L (ref 135–145)

## 2017-01-02 LAB — LIPID PANEL
Cholesterol: 167 mg/dL (ref 0–200)
HDL: 50.9 mg/dL (ref >39.00)
LDL Cholesterol: 82 mg/dL (ref 0–99)
NonHDL: 116.55
Total CHOL/HDL Ratio: 3
Triglycerides: 174 mg/dL — ABNORMAL HIGH (ref 0.0–149.0)
VLDL: 34.8 mg/dL (ref 0.0–40.0)

## 2017-01-02 LAB — CBC WITH DIFFERENTIAL/PLATELET
Basophils Absolute: 0 10*3/uL (ref 0.0–0.1)
Basophils Relative: 0.3 % (ref 0.0–3.0)
Eosinophils Absolute: 0.2 10*3/uL (ref 0.0–0.7)
Eosinophils Relative: 2.9 % (ref 0.0–5.0)
HCT: 34.1 % — ABNORMAL LOW (ref 36.0–46.0)
Hemoglobin: 11.6 g/dL — ABNORMAL LOW (ref 12.0–15.0)
Lymphocytes Relative: 34.9 % (ref 12.0–46.0)
Lymphs Abs: 2 10*3/uL (ref 0.7–4.0)
MCHC: 34.1 g/dL (ref 30.0–36.0)
MCV: 89.4 fl (ref 78.0–100.0)
Monocytes Absolute: 0.4 10*3/uL (ref 0.1–1.0)
Monocytes Relative: 6.9 % (ref 3.0–12.0)
Neutro Abs: 3.2 10*3/uL (ref 1.4–7.7)
Neutrophils Relative %: 55 % (ref 43.0–77.0)
Platelets: 250 10*3/uL (ref 150.0–400.0)
RBC: 3.81 Mil/uL — ABNORMAL LOW (ref 3.87–5.11)
RDW: 12.8 % (ref 11.5–15.5)
WBC: 5.7 10*3/uL (ref 4.0–10.5)

## 2017-01-02 LAB — HEPATIC FUNCTION PANEL
ALT: 41 U/L — ABNORMAL HIGH (ref 0–35)
AST: 48 U/L — ABNORMAL HIGH (ref 0–37)
Albumin: 4 g/dL (ref 3.5–5.2)
Alkaline Phosphatase: 115 U/L (ref 39–117)
Bilirubin, Direct: 0.1 mg/dL (ref 0.0–0.3)
Total Bilirubin: 0.4 mg/dL (ref 0.2–1.2)
Total Protein: 6.5 g/dL (ref 6.0–8.3)

## 2017-01-02 LAB — HEMOGLOBIN A1C: Hgb A1c MFr Bld: 6.5 % (ref 4.6–6.5)

## 2017-01-02 LAB — VITAMIN B12: Vitamin B-12: 536 pg/mL (ref 211–911)

## 2017-01-02 LAB — VITAMIN D 25 HYDROXY (VIT D DEFICIENCY, FRACTURES): VITD: 26.87 ng/mL — ABNORMAL LOW (ref 30.00–100.00)

## 2017-01-02 LAB — TSH: TSH: 3.04 u[IU]/mL (ref 0.35–4.50)

## 2017-01-02 NOTE — Patient Instructions (Signed)
Ms. Ellerman , Thank you for taking time to come for your Medicare Wellness Visit. I appreciate your ongoing commitment to your health goals. Please review the following plan we discussed and let me know if I can assist you in the future.   These are the goals we discussed: Goals    . Increase physical activity          Starting 01/02/2017, I will increase my activity as tolerated and continue doing resistance band exercise daily.       This is a list of the screening recommended for you and due dates:  Health Maintenance  Topic Date Due  . Complete foot exam   08/23/2014  . Eye exam for diabetics  12/24/2014  . Hemoglobin A1C  10/17/2016  . Colon Cancer Screening  10/19/2016  . DEXA scan (bone density measurement)  11/01/2016  . Pneumonia vaccines (1 of 2 - PCV13) 11/01/2016  . Flu Shot  01/25/2017  . Urine Protein Check  04/18/2017  . Mammogram  06/15/2018  . Tetanus Vaccine  10/17/2023  .  Hepatitis C: One time screening is recommended by Center for Disease Control  (CDC) for  adults born from [birth year] through 1964.   Completed  . HIV Screening  Completed   Preventive Care for Adults  A healthy lifestyle and preventive care can promote health and wellness. Preventive health guidelines for adults include the following key practices.  . A routine yearly physical is a good way to check with your health care provider about your health and preventive screening. It is a chance to share any concerns and updates on your health and to receive a thorough exam.  . Visit your dentist for a routine exam and preventive care every 6 months. Brush your teeth twice a day and floss once a day. Good oral hygiene prevents tooth decay and gum disease.  . The frequency of eye exams is based on your age, health, family medical history, use  of contact lenses, and other factors. Follow your health care provider's ecommendations for frequency of eye exams.  . Eat a healthy diet. Foods like vegetables,  fruits, whole grains, low-fat dairy products, and lean protein foods contain the nutrients you need without too many calories. Decrease your intake of foods high in solid fats, added sugars, and salt. Eat the right amount of calories for you. Get information about a proper diet from your health care provider, if necessary.  . Regular physical exercise is one of the most important things you can do for your health. Most adults should get at least 150 minutes of moderate-intensity exercise (any activity that increases your heart rate and causes you to sweat) each week. In addition, most adults need muscle-strengthening exercises on 2 or more days a week.  Silver Sneakers may be a benefit available to you. To determine eligibility, you may visit the website: www.silversneakers.com or contact program at (720)442-1902 Mon-Fri between 8AM-8PM.   . Maintain a healthy weight. The body mass index (BMI) is a screening tool to identify possible weight problems. It provides an estimate of body fat based on height and weight. Your health care provider can find your BMI and can help you achieve or maintain a healthy weight.   For adults 20 years and older: ? A BMI below 18.5 is considered underweight. ? A BMI of 18.5 to 24.9 is normal. ? A BMI of 25 to 29.9 is considered overweight. ? A BMI of 30 and above is considered obese.   Marland Kitchen  Maintain normal blood lipids and cholesterol levels by exercising and minimizing your intake of saturated fat. Eat a balanced diet with plenty of fruit and vegetables. Blood tests for lipids and cholesterol should begin at age 99 and be repeated every 5 years. If your lipid or cholesterol levels are high, you are over 50, or you are at high risk for heart disease, you may need your cholesterol levels checked more frequently. Ongoing high lipid and cholesterol levels should be treated with medicines if diet and exercise are not working.  . If you smoke, find out from your health care  provider how to quit. If you do not use tobacco, please do not start.  . If you choose to drink alcohol, please do not consume more than 2 drinks per day. One drink is considered to be 12 ounces (355 mL) of beer, 5 ounces (148 mL) of wine, or 1.5 ounces (44 mL) of liquor.  . If you are 17-13 years old, ask your health care provider if you should take aspirin to prevent strokes.  . Use sunscreen. Apply sunscreen liberally and repeatedly throughout the day. You should seek shade when your shadow is shorter than you. Protect yourself by wearing long sleeves, pants, a wide-brimmed hat, and sunglasses year round, whenever you are outdoors.  . Once a month, do a whole body skin exam, using a mirror to look at the skin on your back. Tell your health care provider of new moles, moles that have irregular borders, moles that are larger than a pencil eraser, or moles that have changed in shape or color.

## 2017-01-04 ENCOUNTER — Telehealth: Payer: Self-pay | Admitting: *Deleted

## 2017-01-04 NOTE — Telephone Encounter (Signed)
Urine drug screen for this encounter is consistent for prescribed medication 

## 2017-01-05 ENCOUNTER — Encounter: Payer: Self-pay | Admitting: Registered Nurse

## 2017-01-06 NOTE — Progress Notes (Signed)
I reviewed health advisor's note, was available for consultation, and agree with documentation and plan.   Signed,  Chrisotpher Rivero T. Keyunna Coco, MD  

## 2017-01-09 ENCOUNTER — Encounter: Payer: Self-pay | Admitting: Gastroenterology

## 2017-01-09 ENCOUNTER — Encounter: Payer: Self-pay | Admitting: Family Medicine

## 2017-01-09 ENCOUNTER — Ambulatory Visit (INDEPENDENT_AMBULATORY_CARE_PROVIDER_SITE_OTHER): Payer: Medicare Other | Admitting: Family Medicine

## 2017-01-09 ENCOUNTER — Telehealth: Payer: Self-pay | Admitting: *Deleted

## 2017-01-09 VITALS — BP 130/70 | HR 75 | Temp 98.4°F | Ht 65.0 in | Wt 228.5 lb

## 2017-01-09 DIAGNOSIS — D171 Benign lipomatous neoplasm of skin and subcutaneous tissue of trunk: Secondary | ICD-10-CM

## 2017-01-09 DIAGNOSIS — Z1211 Encounter for screening for malignant neoplasm of colon: Secondary | ICD-10-CM

## 2017-01-09 DIAGNOSIS — L0293 Carbuncle, unspecified: Secondary | ICD-10-CM

## 2017-01-09 DIAGNOSIS — Z0000 Encounter for general adult medical examination without abnormal findings: Secondary | ICD-10-CM | POA: Diagnosis not present

## 2017-01-09 NOTE — Telephone Encounter (Signed)
[name] asked if she would like some samples, she would.  Her husband will pick up. Education material included with instructions for use. [name] is to call back after one week to let [name] know if it is working. Disp #4 box for total of #12 tablets to be taken one per day.

## 2017-01-09 NOTE — Progress Notes (Signed)
Dr. Frederico Hamman T. Emeka Lindner, MD, [facility] [place], [address] Phone: [phone] Fax: [phone]  01/09/2017  Patient: [name], MRN: [id number], DOB: Jan 29, 1951, 66 y.o.  Primary Physician:  Owens Loffler, MD   Chief Complaint  Patient presents with  . Annual Exam    Part 2   Subjective:   [name] is a 66 y.o. pleasant patient who presents with the following:  Health Maintenance Summary Reviewed and updated, unless pt declines services.  Tobacco History Reviewed. Non-smoker Alcohol: No concerns, no excessive use Exercise Habits: limited activity, rec at least 30 mins 5 times a week STD concerns: none Drug Use: None Birth control method: hyst Menses regular: no Lumps or breast concerns: no Breast Cancer Family History: no  Colon DM: 6.5  Boils.  Lipoma r side.   Health Maintenance  Topic Date Due  . FOOT EXAM  08/23/2014  . OPHTHALMOLOGY EXAM  12/24/2014  . COLONOSCOPY  10/19/2016  . DEXA SCAN  11/01/2016  . INFLUENZA VACCINE  01/25/2017  . URINE MICROALBUMIN  04/18/2017  . HEMOGLOBIN A1C  07/05/2017  . PNA vac Low Risk Adult (2 of 2 - PPSV23) 05/06/2018  . MAMMOGRAM  06/15/2018  . TETANUS/TDAP  10/17/2023  . Hepatitis C Screening  Completed  . HIV Screening  Completed    Immunization History  Administered Date(s) Administered  . Pneumococcal Conjugate-13 01/02/2017  . Pneumococcal Polysaccharide-23 05/06/2013  . Td 06/28/2003, 10/16/2013  . Zoster 11/07/2012   Patient Active Problem List   Diagnosis Date Noted  . Type 2 diabetes mellitus with diabetic neuropathy ([place]) 04/07/2013    Priority: High  . Postlaminectomy syndrome, cervical region 08/29/2011    Priority: High  . FIBROMYALGIA 01/11/2007    Priority: High  . Depression, major, recurrent, in partial remission ([place]) 09/22/2008    Priority: Medium  . Hypothyroidism 05/29/2007    Priority: Medium  .  HYPERLIPIDEMIA 05/29/2007    Priority: Medium  . Essential hypertension 11/13/2006    Priority: Medium  . Lipoma of buttock 01/10/2017  . Chronic respiratory failure with hypoxia ([place]) 04/07/2015  . Obesity hypoventilation syndrome ([place]) 03/26/2015  . Sleep related hypoxia 03/09/2015  . Generalized hyperhidrosis 04/17/2014  . Obesity (BMI 30-39.9) 08/23/2013  . 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 Dysphagia 07/14/2010  . FATIGUE 05/04/2009  . TRANSAMINASES, SERUM, ELEVATED 12/24/2008  . ALLERGIC RHINITIS 09/22/2008  . Vitamin D deficiency 01/11/2007  . Vitamin B 12 deficiency 11/13/2006  . GERD 11/13/2006   Past Medical History:  Diagnosis Date  . Arthritis   . B12 deficiency   . Complication of anesthesia    slow to wake up   . Depression   . Diabetes mellitus without complication ([place])    type II - diet controlled   . Fibromyalgia   . Generalized hyperhidrosis 04/17/2014  . GERD (gastroesophageal reflux disease)   . Heart murmur    Aortic Sclerosis  . Hyperlipemia   . HYPOTHYROIDISM 05/29/2007  . Irritable bowel syndrome   . Migraines    hx of   . 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 PONV (postoperative nausea and vomiting)   . PVC (premature ventricular contraction)   . Type II or unspecified type diabetes mellitus with neurological manifestations, not stated as uncontrolled(250.60) 04/07/2013  . Unspecified essential hypertension    denies htn on 05/09/16  Past Surgical History:  Procedure Laterality Date  . [place] STUDY N/A 01/04/2016   Procedure: [place] STUDY;  Surgeon: Manus Gunning, MD;  [facility]: [facility];  Service: Gastroenterology;  Laterality: N/A;  . ABDOMINAL HYSTERECTOMY  1988  . ADENOIDECTOMY    . ANTERIOR CERVICAL DECOMP/DISCECTOMY FUSION  2009, 1993   first procedure in ~ 1993/Dr Botero  . APPENDECTOMY  1988  . BACK SURGERY    . CHOLECYSTECTOMY  2005  .  ESOPHAGEAL MANOMETRY N/A 01/04/2016   Procedure: ESOPHAGEAL MANOMETRY (EM) 24 HR PH;  Surgeon: Manus Gunning, MD;  [facility];  Service: Gastroenterology;  Laterality: N/A;  . ESOPHAGOGASTRODUODENOSCOPY (EGD) WITH PROPOFOL N/A 05/17/2016   Procedure: ESOPHAGOGASTRODUODENOSCOPY (EGD) WITH PROPOFOL;  Surgeon: Manus Gunning, MD;  [facility];  Service: Gastroenterology;  Laterality: N/A;  . NASAL SINUS SURGERY  2006  . SAVORY DILATION N/A 05/17/2016   Procedure: SAVORY DILATION;  Surgeon: Manus Gunning, MD;  Location: [facility];  Service: Gastroenterology;  Laterality: N/A;  . TONSILLECTOMY AND ADENOIDECTOMY    . TRANSTHORACIC ECHOCARDIOGRAM  2009   Normal LV size and function. EF 60-65%. No regional wall motion and amount is. Mild aortic sclerosis, no stenosis  . TUBAL LIGATION  1979   Social History   Social History  . Marital status: Married    Spouse name: N/A  . Number of children: N/A  . Years of education: N/A   Occupational History  . Bookkeeper    Social History Main Topics  . Smoking status: Never Smoker  . Smokeless tobacco: Never Used  . Alcohol use No  . Drug use: No  . Sexual activity: Not on file   Other Topics Concern  . Not on file   Social History Narrative  . No narrative on file   Family History  Problem Relation Age of Onset  . Breast cancer Mother   . Diabetes Mother   . Heart disease Mother   . Allergies Mother   . Diabetes Father   . Heart disease Father   . Emphysema Father   . Allergies Father   . Stomach cancer Maternal Uncle   . Breast cancer Paternal Aunt   . Colon cancer Neg Hx    Allergies  Allergen Reactions  . Rizatriptan Benzoate Anaphylaxis    Throat and Tongue Swelling Closed  . Sumatriptan Other (See Comments)    Throat closed & Tongue Swelling  . Tizanidine Shortness Of Breath  . Aleve [Naproxen Sodium] Hives  . Clarithromycin     Reaction: unknown  . Cyclobenzaprine Hcl      REACTION: THROAT CLOSED,TOUNGE SWELLING  . Doxycycline     Reaction: unknown  . Duloxetine     Reaction: unknown  . Keflex [Cephalexin] Itching    Sores  . Metronidazole   . Oxycodone Hcl Diarrhea    Vomiting  . Penicillins     TOLERATES CEPHALOSPORINS Reaction: unknown Has patient had a PCN reaction causing immediate rash, facial/tongue/throat swelling, SOB or lightheadedness with hypotension: {unknown Has patient had a PCN reaction causing severe rash involving mucus membranes or skin necrosis: {unknown Has patient had a PCN reaction that required hospitalization {unknown Has patient had a PCN reaction occurring within the last 10 years: {no If all of the above answers are "NO", then may proceed with Cephalosporin use.  . Rosuvastatin     REACTION: MUSCLE ACHES  . Septra [Sulfamethoxazole-Trimethoprim] Other (See Comments)    Rash and itching  .  Venlafaxine Hives  . Voltaren [Diclofenac Sodium] Hives    oral  . Tramadol Diarrhea, Nausea And Vomiting and Palpitations    Medication list has been reviewed and updated.   General: Denies fever, chills, sweats. No significant weight loss. Eyes: Denies blurring,significant itching ENT: Denies earache, sore throat, and hoarseness.  Cardiovascular: Denies chest pains, palpitations, dyspnea on exertion,  Respiratory: Denies cough, dyspnea at rest,wheeezing Breast: no concerns about lumps GI: Denies nausea, vomiting, diarrhea, constipation, change in bowel habits, abdominal pain, melena, hematochezia GU: Denies dysuria, hematuria, urinary hesitancy, nocturia, denies STD risk, no concerns about discharge Musculoskeletal: Denies back pain, joint pain Derm: BOILS Neuro: Denies  paresthesias, frequent falls, frequent headaches Psych: Denies depression, anxiety Endocrine: Denies cold intolerance, heat intolerance, polydipsia Heme: Denies enlarged lymph nodes Allergy: No hayfever  Objective:   BP 130/70   Pulse 75   Temp 98.4  F (36.9 C) (Oral)   Ht 5' 5"  (1.651 m)   Wt 228 lb 8 oz (103.6 kg)   BMI 38.02 kg/m  No exam data present  GEN: well developed, well nourished, no acute distress Eyes: conjunctiva and lids normal, PERRLA, EOMI ENT: TM clear, nares clear, oral exam WNL Neck: supple, no lymphadenopathy, no thyromegaly, no JVD Pulm: clear to auscultation and percussion, respiratory effort normal CV: regular rate and rhythm, S1-S2, no murmur, rub or gallop, no bruits Chest: no scars, masses, no lumps BREAST: breast exam declined GI: soft, non-tender; no hepatosplenomegaly, masses; active bowel sounds all quadrants GU: GU exam declined Lymph: no cervical, axillary or inguinal adenopathy MSK: gait normal, muscle tone and strength WNL, no joint swelling, effusions, discoloration, crepitus  SKIN: LIPOMA R BUTTOCK, BOILS AND SCARRING UNDER PANNUS AND IN GROIN FOLDS Neuro: normal mental status, normal strength, sensation, and motion Psych: alert; oriented to person, place and time, normally interactive and not anxious or depressed in appearance.   All labs reviewed with patient. Lipids:    Component Value Date/Time   CHOL 167 01/02/2017 1416   TRIG 174.0 (H) 01/02/2017 1416   HDL 50.90 01/02/2017 1416   LDLDIRECT 113.0 04/03/2013 1307   VLDL 34.8 01/02/2017 1416   CHOLHDL 3 01/02/2017 1416   CBC: CBC Latest Ref Rng & Units 01/02/2017 04/18/2016 01/13/2014  WBC 4.0 - 10.5 K/uL 5.7 7.1 8.3  Hemoglobin 12.0 - 15.0 g/dL 11.6(L) 12.6 12.3  Hematocrit 36.0 - 46.0 % 34.1(L) 37.3 36.5  Platelets 150.0 - 400.0 K/uL 250.0 258.0 557.3    Basic Metabolic Panel:    Component Value Date/Time   NA 140 01/02/2017 1416   K 4.0 01/02/2017 1416   CL 101 01/02/2017 1416   CO2 33 (H) 01/02/2017 1416   BUN 14 01/02/2017 1416   CREATININE 0.83 01/02/2017 1416   GLUCOSE 97 01/02/2017 1416   CALCIUM 9.4 01/02/2017 1416   Hepatic Function Latest Ref Rng & Units 01/02/2017 04/18/2016 01/13/2014  Total Protein 6.0 -  8.3 g/dL 6.5 6.8 7.1  Albumin 3.5 - 5.2 g/dL 4.0 4.1 3.7  AST 0 - 37 U/L 48(H) 33 27  ALT 0 - 35 U/L 41(H) 36(H) 26  Alk Phosphatase 39 - 117 U/L 115 98 88  Total Bilirubin 0.2 - 1.2 mg/dL 0.4 0.3 0.6  Bilirubin, Direct 0.0 - 0.3 mg/dL 0.1 0.1 0.1    Lab Results  Component Value Date   TSH 3.04 01/02/2017   No results found.  Assessment and Plan:   Healthcare maintenance  Screen for colon cancer - Plan: Ambulatory referral to Gastroenterology  Recurrent boils - Plan: Ambulatory referral to Dermatology  Lipoma of buttock  The boils are challenging.  She has had these recurrently.  She also has hyperhidrosis and Body mass index is 38.02 kg/m. we have done a Bactroban protocol for eradication of nasal MRSA.  She still is had intermittent boils in the groin region and she is very frustrated by this.  To my knowledge, I cannot think of anything else that might help further.  She is interested in talking about this with dermatology, and I think that is completely reasonable.  I reassured her regarding the lipoma.  I probably wouldn't do anything at all with it.  If it gets particularly bothersome, then she could have general surgery remove it.  Health Maintenance Exam: The patient's preventative maintenance and recommended screening tests for an annual wellness exam were reviewed in full today. Brought up to date unless services declined.  Counselled on the importance of diet, exercise, and its role in overall health and mortality. The patient's FH and SH was reviewed, including their home life, tobacco status, and drug and alcohol status.  Follow-up in 1 year for physical exam or additional follow-up below.  Follow-up: No Follow-up on file. Or follow-up in 1 year if not noted.  Future Appointments Date Time Provider [place]  01/25/2017 2:30 PM Bayard Hugger, NP [facility]  03/01/2017 2:30 PM [facility] PREVISIT RM50 [facility]  03/13/2017 2:30 PM Armbruster,  Renelda Loma, MD [facility]  01/03/2018 1:45 PM Eustace Pen, LPN [facility]  01/10/2018 2:45 PM Angelynn Lemus, Frederico Hamman, MD [facility]    Meds ordered this encounter  Medications  . Naldemedine Tosylate 0.2 MG TABS    Sig: Take 1 tablet by mouth daily.  . diclofenac sodium (VOLTAREN) 1 % GEL    Sig: Apply 4 g topically 4 (four) times daily as needed.  . mupirocin ointment (BACTROBAN) 2 %    Sig: Place 1 application into the nose 2 (two) times daily. for one week as needed for boils   Medications Discontinued During This Encounter  Medication Reason  . Naldemedine Tosylate 0.2 MG TABS Change in therapy  . diclofenac sodium (VOLTAREN) 1 % GEL Change in therapy  . mupirocin ointment (BACTROBAN) 2 % Change in therapy   Orders Placed This Encounter  Procedures  . Ambulatory referral to Gastroenterology  . Ambulatory referral to Dermatology    Signed,  Frederico Hamman T. Charlye Spare, MD   Allergies as of 01/09/2017      Reactions   Rizatriptan Benzoate Anaphylaxis   Throat and Tongue Swelling Closed   Sumatriptan Other (See Comments)   Throat closed & Tongue Swelling   Tizanidine Shortness Of Breath   Aleve [naproxen Sodium] Hives   Clarithromycin    Reaction: unknown   Cyclobenzaprine Hcl    REACTION: THROAT CLOSED,TOUNGE SWELLING   Doxycycline    Reaction: unknown   Duloxetine    Reaction: unknown   Keflex [cephalexin] Itching   Sores   Metronidazole    Oxycodone Hcl Diarrhea   Vomiting   Penicillins    TOLERATES CEPHALOSPORINS Reaction: unknown Has patient had a PCN reaction causing immediate rash, facial/tongue/throat swelling, SOB or lightheadedness with hypotension: {unknown Has patient had a PCN reaction causing severe rash involving mucus membranes or skin necrosis: {unknown Has patient had a PCN reaction that required hospitalization {unknown Has patient had a PCN reaction occurring within the last 10 years: {no If all of the above answers  are "NO", then may proceed  with Cephalosporin use.   Rosuvastatin    REACTION: MUSCLE ACHES   Septra [sulfamethoxazole-trimethoprim] Other (See Comments)   Rash and itching   Venlafaxine Hives   Voltaren [diclofenac Sodium] Hives   oral   Tramadol Diarrhea, Nausea And Vomiting, Palpitations      Medication List       Accurate as of 01/09/17 11:59 PM. Always use your most recent med list.          ACCU-CHEK SOFTCLIX LANCETS lancets CHECK BLOOD SUGAR DAILY AS  DIRECTED   aspirin 81 MG tablet Take 81 mg by mouth daily.   B COMPLEX 100 PO Take 1 tablet by mouth daily.   CALCIUM 600 PO Take 600 mg by mouth 2 (two) times daily.   carboxymethylcellulose 0.5 % Soln Commonly known as:  REFRESH PLUS Place 1 drop into both eyes 3 (three) times daily as needed.   citalopram 40 MG tablet Commonly known as:  CELEXA TAKE 1 TABLET BY MOUTH  DAILY   Coenzyme Q-10 100 MG capsule Take 100 mg by mouth daily.   diclofenac sodium 1 % Gel Commonly known as:  VOLTAREN Apply 4 g topically 4 (four) times daily as needed.   gabapentin 300 MG capsule Commonly known as:  NEURONTIN Take 1 capsule (300 mg total) by mouth 3 (three) times daily.   GRAPE SEED EXTRACT PO Take by mouth. 1 tablet daily   levothyroxine 50 MCG tablet Commonly known as:  SYNTHROID, LEVOTHROID TAKE 1 TABLET BY MOUTH  DAILY   loratadine 10 MG tablet Commonly known as:  CLARITIN Take 10 mg by mouth daily.   MAGNESIUM GLYCINATE PLUS PO Take 400 mg by mouth at bedtime.   methocarbamol 500 MG tablet Commonly known as:  ROBAXIN TAKE ONE (1) TABLET BY MOUTH EVERY EIGHTHOURS AS NEEDED FOR MUSCLE SPASMS   mirtazapine 15 MG tablet Commonly known as:  REMERON TAKE 1 TABLET BY MOUTH AT  BEDTIME   morphine 15 MG 12 hr tablet Commonly known as:  MS CONTIN Take 1 tablet (15 mg total) by mouth 3 (three) times daily.   mupirocin ointment 2 % Commonly known as:  BACTROBAN Place 1 application into the nose 2 (two)  times daily. for one week as needed for boils   Naldemedine Tosylate 0.2 MG Tabs Take 1 tablet by mouth daily.   NON FORMULARY Daith piercing in right ear for headaches   oxybutynin 10 MG 24 hr tablet Commonly known as:  DITROPAN-XL TAKE ONE TABLET BY MOUTH EVERY NIGHT AT BEDTIME   RABEprazole 20 MG tablet Commonly known as:  ACIPHEX Take 1 tablet (20 mg total) by mouth 2 (two) times daily.   ranitidine 150 MG capsule Commonly known as:  ZANTAC Take 150 mg by mouth 2 (two) times daily.   TURMERIC PO Take 1 capsule by mouth daily.   Vitamin D 2000 units tablet Take 2,000 Units by mouth daily.

## 2017-01-09 NOTE — Patient Instructions (Signed)
REFERRALS TO SPECIALISTS, SPECIAL TESTS (MRI, CT, ULTRASOUNDS)  MARION or Dlynn will help you. ASK CHECK-IN FOR HELP.  Imaging / Special Testing referrals sometimes can be done same day if EMERGENCY, but others can take 2 or 3 days to get an appointment. Starting in 2015, many of the new Medicare plans and Obamacare plans take much longer.   Specialist appointment times vary a great deal, based on their schedule / openings. -- Some specialists have very long wait times. (Example. Dermatology. Multiple months  for non-cancer)   

## 2017-01-10 DIAGNOSIS — D171 Benign lipomatous neoplasm of skin and subcutaneous tissue of trunk: Secondary | ICD-10-CM | POA: Insufficient documentation

## 2017-01-21 DIAGNOSIS — J9610 Chronic respiratory failure, unspecified whether with hypoxia or hypercapnia: Secondary | ICD-10-CM | POA: Diagnosis not present

## 2017-01-25 ENCOUNTER — Encounter: Payer: Medicare Other | Attending: Physical Medicine & Rehabilitation | Admitting: Registered Nurse

## 2017-01-25 ENCOUNTER — Encounter: Payer: Self-pay | Admitting: Registered Nurse

## 2017-01-25 VITALS — BP 122/74 | HR 69

## 2017-01-25 DIAGNOSIS — Z5181 Encounter for therapeutic drug level monitoring: Secondary | ICD-10-CM | POA: Diagnosis not present

## 2017-01-25 DIAGNOSIS — T402X5A Adverse effect of other opioids, initial encounter: Secondary | ICD-10-CM | POA: Diagnosis not present

## 2017-01-25 DIAGNOSIS — K5903 Drug induced constipation: Secondary | ICD-10-CM | POA: Diagnosis not present

## 2017-01-25 DIAGNOSIS — M797 Fibromyalgia: Secondary | ICD-10-CM

## 2017-01-25 DIAGNOSIS — G609 Hereditary and idiopathic neuropathy, unspecified: Secondary | ICD-10-CM | POA: Diagnosis not present

## 2017-01-25 DIAGNOSIS — G8929 Other chronic pain: Secondary | ICD-10-CM | POA: Diagnosis not present

## 2017-01-25 DIAGNOSIS — G894 Chronic pain syndrome: Secondary | ICD-10-CM | POA: Diagnosis not present

## 2017-01-25 DIAGNOSIS — Z79899 Other long term (current) drug therapy: Secondary | ICD-10-CM

## 2017-01-25 DIAGNOSIS — M961 Postlaminectomy syndrome, not elsewhere classified: Secondary | ICD-10-CM | POA: Diagnosis not present

## 2017-01-25 DIAGNOSIS — M25562 Pain in left knee: Secondary | ICD-10-CM

## 2017-01-25 DIAGNOSIS — M545 Low back pain, unspecified: Secondary | ICD-10-CM

## 2017-01-25 DIAGNOSIS — M25561 Pain in right knee: Secondary | ICD-10-CM

## 2017-01-25 MED ORDER — MORPHINE SULFATE ER 15 MG PO TBCR: 15.0000 mg | EXTENDED_RELEASE_TABLET | Freq: Three times a day (TID) | ORAL | 0 refills | Status: DC

## 2017-01-25 MED ORDER — NALOXEGOL OXALATE 12.5 MG PO TABS: 12.5000 mg | ORAL_TABLET | Freq: Every day | ORAL | 2 refills | Status: DC

## 2017-01-25 NOTE — Progress Notes (Signed)
Subjective:    Patient ID: [name], female    DOB: [birth year], 66 y.o.   MRN: [id number]  HPI: [name] is a 66 year old female who returns for follow up appointmentfor chronic pain and medication refill. She states her pain is located in her neck, mid back and bilateral knees.She rates her pain 4. Her current exercise regime is walking and using bands.  [name] states her brother-in-law passed away last week from MVA, emotional support given.   Her last UDS was on 12/26/2016, it was consistent.   Pain Inventory Average Pain 4 Pain Right Now 4 My pain is constant, burning and aching  In the last 24 hours, has pain interfered with the following? General activity 4 Relation with others 4 Enjoyment of life 4 What TIME of day is your pain at its worst? evening and night Sleep (in general) Good  Pain is worse with: walking, bending, sitting, inactivity, standing and some activites Pain improves with: rest, heat/ice, therapy/exercise, pacing activities, medication, TENS and injections Relief from Meds: 5  Mobility how many minutes can you walk? 10-15 ability to climb steps?  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  no  Physicians involved in your care Any changes since last visit?  no   Family History  Problem Relation Age of Onset  . Breast cancer Mother   . Diabetes Mother   . Heart disease Mother   . Allergies Mother   . Diabetes Father   . Heart disease Father   . Emphysema Father   . Allergies Father   . Stomach cancer Maternal Uncle   . Breast cancer Paternal Aunt   . Colon cancer Neg Hx    Social History   Social History  . Marital status: Married    Spouse  name: N/A  . Number of children: N/A  . Years of education: N/A   Occupational History  . Bookkeeper    Social History Main Topics  . Smoking status: Never Smoker  . Smokeless tobacco: Never Used  . Alcohol use No  . Drug use: No  . Sexual activity: Not Asked   Other Topics Concern  . None   Social History Narrative  . None   Past Surgical History:  Procedure Laterality Date  . [place] STUDY N/A 01/04/2016   Procedure: [place] STUDY;  Surgeon: Manus Gunning, MD;  [facility];  Service: Gastroenterology;  Laterality: N/A;  . ABDOMINAL HYSTERECTOMY  1988  . ADENOIDECTOMY    . ANTERIOR CERVICAL DECOMP/DISCECTOMY FUSION  2009, 1993   first procedure in ~ 1993/Dr Botero  . APPENDECTOMY  1988  . BACK SURGERY    . CHOLECYSTECTOMY  2005  . ESOPHAGEAL MANOMETRY N/A 01/04/2016   Procedure: ESOPHAGEAL MANOMETRY (EM) 24 HR PH;  Surgeon: Manus Gunning, MD;  [facility];  Service: Gastroenterology;  Laterality: N/A;  . ESOPHAGOGASTRODUODENOSCOPY (EGD) WITH PROPOFOL N/A 05/17/2016   Procedure: ESOPHAGOGASTRODUODENOSCOPY (EGD) WITH PROPOFOL;  Surgeon: Manus Gunning, MD;  [facility];  Service: Gastroenterology;  Laterality: N/A;  . NASAL SINUS SURGERY  2006  . SAVORY DILATION N/A 05/17/2016   Procedure: SAVORY DILATION;  Surgeon: Manus Gunning, MD;  [facility];  Service: Gastroenterology;  Laterality: N/A;  . TONSILLECTOMY AND ADENOIDECTOMY    . TRANSTHORACIC ECHOCARDIOGRAM  2009   Normal LV size and function. EF 60-65%. No regional wall motion and amount is. Mild aortic sclerosis, no stenosis  . TUBAL LIGATION  1979   Past Medical History:  Diagnosis Date  . Arthritis   . B12 deficiency   . Complication of anesthesia    slow to wake up   . Depression   . Diabetes mellitus without complication ([place])    type II - diet controlled   . Fibromyalgia   . Generalized hyperhidrosis 04/17/2014  . GERD  (gastroesophageal reflux disease)   . Heart murmur    Aortic Sclerosis  . Hyperlipemia   . HYPOTHYROIDISM 05/29/2007  . Irritable bowel syndrome   . Migraines    hx of   . 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 PONV (postoperative nausea and vomiting)   . PVC (premature ventricular contraction)   . Type II or unspecified type diabetes mellitus with neurological manifestations, not stated as uncontrolled(250.60) 04/07/2013  . Unspecified essential hypertension    denies htn on 05/09/16   BP 122/74   Pulse 69   SpO2 98%   Opioid Risk Score:  1 Fall Risk Score:  `1  Depression screen PHQ 2/9  Depression screen [facility] 2/9 01/25/2017 01/02/2017 10/26/2016 07/04/2016 02/03/2016 10/23/2015 10/05/2015  Decreased Interest 1 0 1 0 0 2 2  Down, Depressed, Hopeless 1 0 1 0 0 0 0  PHQ - 2 Score 2 0 2 0 0 2 2  Altered sleeping - - - - - - 3  Tired, decreased energy - - - - - - 1  Change in appetite - - - - - - 0  Feeling bad or failure about yourself  - - - - - - 0  Trouble concentrating - - - - - - 1  Moving slowly or fidgety/restless - - - - - - 0  Suicidal thoughts - - - - - - 0  PHQ-9 Score - - - - - - 7  Difficult doing work/chores - - - - - - Somewhat difficult  Some recent data might be hidden    Review of Systems  HENT: Negative.   Eyes: Negative.   Respiratory: Negative.   Cardiovascular: Positive for leg swelling.  Gastrointestinal: Positive for constipation.  Endocrine: Negative.   Genitourinary:       Bladder control  Musculoskeletal: Negative.   Skin: Negative.   Neurological: Positive for tremors, weakness and numbness.       Tingling  Hematological: Negative.   Psychiatric/Behavioral: Positive for dysphoric mood.  All other systems reviewed and are negative.      Objective:   Physical Exam  Constitutional: She is oriented to person, place, and time. She appears well-developed and well-nourished.  HENT:  Head: Normocephalic and atraumatic.    Neck: Normal range of motion. Neck supple.  Cardiovascular: Normal rate and regular rhythm.   Pulmonary/Chest: Effort normal and breath sounds normal.  Musculoskeletal:  Normal Muscle Bulk and Muscle Testing Reveals: Upper Extremities: Full ROM and Muscle Strength 5/5 Thoracic Paraspinal Tenderness: T-7-T-9 Lower Extremities: Full ROM and Muscle Strength 5/5 Bilateral Lower Extremity Flexion Produces Pain into Bilateral Patella's Arises from Table with ease Narrow Based Gait   Neurological: She is alert and oriented to person, place, and time.  Skin: Skin is warm and dry.  Psychiatric: She has  a normal mood and affect.          Assessment & Plan:  1. Cervical postlaminectomy syndrome: with neck and shoulder pain. 01/25/2017. Continue exercise routine and using heat therapy. Continue MS Contin 15 mg one tablet three times a day #90.  We will continue the opioid monitoring program, this consists of regular clinic visits, examinations, urine drug screen, pill counts as well as use of [place] Controlled Substance reporting System. 2. Fibromyalgia/ Neuropathy: Continue with Gabapentin, activity and exercise regime. 01/25/2017. 3. Right Shoulder Pain: No Complaints Today. Continue HEP and alternate heat and ice therapy. 01/25/2017 4. Bilateral Knee Pain/ Mild Degenerative Changes:.Continue Voltaren Gel.01/25/2017 5. Right Ankle Pain: No Complaints Today. Continue HEP. 01/25/2017 6. Muscle Spasm: Continue Robaxin. 01/25/2017 7. Opioid Induced Constipation: RX: Movantik  20 minutes of face to face patient care time was spent during this visit. All questions were encouraged and answered.   F/U in 1 month

## 2017-01-27 ENCOUNTER — Encounter: Payer: Self-pay | Admitting: Registered Nurse

## 2017-01-27 ENCOUNTER — Telehealth: Payer: Self-pay

## 2017-01-27 NOTE — Telephone Encounter (Signed)
Recieved faxed prior authorization request from patients pharmacy in [place] to [facility], attempted prior authorization Prior authorization has returned DENIED due to patient not having been on at least 2 trials and failures of pre approved medications for constipation.  Please advise

## 2017-02-02 ENCOUNTER — Telehealth: Payer: Self-pay | Admitting: *Deleted

## 2017-02-02 MED ORDER — LUBIPROSTONE 8 MCG PO CAPS: 8.0000 ug | ORAL_CAPSULE | Freq: Every day | ORAL | 0 refills | Status: DC

## 2017-02-02 NOTE — Telephone Encounter (Signed)
I spoke with [name] and she is willing to try the Amitiza.  Order placed per [place].

## 2017-02-16 ENCOUNTER — Telehealth: Payer: Self-pay

## 2017-02-16 DIAGNOSIS — L738 Other specified follicular disorders: Secondary | ICD-10-CM | POA: Diagnosis not present

## 2017-02-16 NOTE — Telephone Encounter (Signed)
[name] called, stated that the new prescribed medication for her constipation is not helping, only makes her nauseated, please advise

## 2017-02-17 ENCOUNTER — Telehealth: Payer: Self-pay

## 2017-02-17 MED ORDER — NALOXEGOL OXALATE 12.5 MG PO TABS: 12.5000 mg | ORAL_TABLET | Freq: Every day | ORAL | 3 refills | Status: DC

## 2017-02-17 NOTE — Telephone Encounter (Signed)
Restarting prior authorization appeal for movantik medication for this patient, called [employer] for appeal

## 2017-02-17 NOTE — Telephone Encounter (Signed)
Return [name] call, she had good results with Movantik, Insurance denied. She was prescribed Amitza with no relief. We will prescribe Movantik 12.5 mg. CMA will submit a PA. She verbalizes understanding.

## 2017-02-21 ENCOUNTER — Telehealth: Payer: Self-pay | Admitting: Registered Nurse

## 2017-02-21 DIAGNOSIS — J9610 Chronic respiratory failure, unspecified whether with hypoxia or hypercapnia: Secondary | ICD-10-CM | POA: Diagnosis not present

## 2017-02-21 NOTE — Telephone Encounter (Signed)
Appeal Letter written.

## 2017-02-22 ENCOUNTER — Telehealth: Payer: Self-pay

## 2017-02-22 NOTE — Telephone Encounter (Signed)
Called patient and informed her that the appeal of her movantik was sent in for review.

## 2017-02-24 ENCOUNTER — Other Ambulatory Visit: Payer: Self-pay | Admitting: Family Medicine

## 2017-02-28 ENCOUNTER — Encounter: Payer: Medicare Other | Attending: Physical Medicine & Rehabilitation | Admitting: Registered Nurse

## 2017-02-28 ENCOUNTER — Encounter: Payer: Self-pay | Admitting: Registered Nurse

## 2017-02-28 VITALS — BP 121/66 | HR 68

## 2017-02-28 DIAGNOSIS — Z5181 Encounter for therapeutic drug level monitoring: Secondary | ICD-10-CM | POA: Diagnosis not present

## 2017-02-28 DIAGNOSIS — Z79899 Other long term (current) drug therapy: Secondary | ICD-10-CM | POA: Diagnosis not present

## 2017-02-28 DIAGNOSIS — G8929 Other chronic pain: Secondary | ICD-10-CM

## 2017-02-28 DIAGNOSIS — M25562 Pain in left knee: Secondary | ICD-10-CM

## 2017-02-28 DIAGNOSIS — M797 Fibromyalgia: Secondary | ICD-10-CM

## 2017-02-28 DIAGNOSIS — G894 Chronic pain syndrome: Secondary | ICD-10-CM | POA: Diagnosis not present

## 2017-02-28 DIAGNOSIS — M545 Low back pain, unspecified: Secondary | ICD-10-CM

## 2017-02-28 DIAGNOSIS — M961 Postlaminectomy syndrome, not elsewhere classified: Secondary | ICD-10-CM | POA: Diagnosis not present

## 2017-02-28 DIAGNOSIS — M25561 Pain in right knee: Secondary | ICD-10-CM | POA: Diagnosis not present

## 2017-02-28 DIAGNOSIS — G609 Hereditary and idiopathic neuropathy, unspecified: Secondary | ICD-10-CM

## 2017-02-28 MED ORDER — MORPHINE SULFATE ER 15 MG PO TBCR: 15.0000 mg | EXTENDED_RELEASE_TABLET | Freq: Three times a day (TID) | ORAL | 0 refills | Status: DC

## 2017-02-28 NOTE — Progress Notes (Signed)
Subjective:    Patient ID: [name], female    DOB: Mar 31, 1951, 66 y.o.   MRN: [id number]  HPI: [name] is a 66year old female who returns for follow up appointmentfor chronic pain and medication refill. She states her pain is located in her neck radiating into her right shoulder,mid back and bilateral knees.She rates her pain 4. Her current exercise regime is walking and using bands.  Her last UDS was on 12/26/2016, it was consistent.   Pain Inventory Average Pain 5 Pain Right Now 4 My pain is constant, burning and aching  In the last 24 hours, has pain interfered with the following? General activity 4 Relation with others 4 Enjoyment of life 4 What TIME of day is your pain at its worst? evening and night Sleep (in general) Fair  Pain is worse with: walking, bending, standing and some activites Pain improves with: rest, heat/ice, therapy/exercise, pacing activities, medication, TENS and injections Relief from Meds: 6  Mobility walk without assistance how many minutes can you walk? 10-15 ability to climb steps?  yes do you drive?  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  no  Physicians involved in your care Any changes since last visit?  no   Family History  Problem Relation Age of Onset  . Breast cancer Mother   . Diabetes Mother   . Heart disease Mother   . Allergies Mother   . Diabetes Father   . Heart disease Father   . Emphysema Father   . Allergies Father   . Stomach cancer Maternal Uncle   . Breast cancer Paternal Aunt   . Colon cancer Neg Hx    Social History   Social History  . Marital status: Married    Spouse name: N/A  . Number of children: N/A  .  Years of education: N/A   Occupational History  . Bookkeeper    Social History Main Topics  . Smoking status: Never Smoker  . Smokeless tobacco: Never Used  . Alcohol use No  . Drug use: No  . Sexual activity: Not Asked   Other Topics Concern  . None   Social History Narrative  . None   Past Surgical History:  Procedure Laterality Date  . [place] STUDY N/A 01/04/2016   Procedure: [place] STUDY;  Surgeon: Manus Gunning, MD;  Location: [facility];  Service: Gastroenterology;  Laterality: N/A;  . ABDOMINAL HYSTERECTOMY  1988  . ADENOIDECTOMY    . ANTERIOR CERVICAL DECOMP/DISCECTOMY FUSION  2009, 1993   first procedure in ~ 1993/Dr Botero  . APPENDECTOMY  1988  . BACK SURGERY    . CHOLECYSTECTOMY  2005  . ESOPHAGEAL MANOMETRY N/A 01/04/2016   Procedure: ESOPHAGEAL MANOMETRY (EM) 24 HR PH;  Surgeon: Manus Gunning, MD;  Location: [facility];  Service: Gastroenterology;  Laterality: N/A;  . ESOPHAGOGASTRODUODENOSCOPY (EGD) WITH PROPOFOL N/A 05/17/2016   Procedure: ESOPHAGOGASTRODUODENOSCOPY (EGD) WITH PROPOFOL;  Surgeon: Manus Gunning, MD;  Location: [facility];  Service: Gastroenterology;  Laterality: N/A;  . NASAL SINUS SURGERY  2006  . SAVORY DILATION N/A 05/17/2016   Procedure: SAVORY DILATION;  Surgeon: Manus Gunning, MD;  Location: [facility];  Service: Gastroenterology;  Laterality: N/A;  .  TONSILLECTOMY AND ADENOIDECTOMY    . TRANSTHORACIC ECHOCARDIOGRAM  2009   Normal LV size and function. EF 60-65%. No regional wall motion and amount is. Mild aortic sclerosis, no stenosis  . TUBAL LIGATION  1979   Past Medical History:  Diagnosis Date  . Arthritis   . B12 deficiency   . Complication of anesthesia    slow to wake up   . Depression   . Diabetes mellitus without complication ([place])    type II - diet controlled   . Fibromyalgia   . Generalized hyperhidrosis 04/17/2014  . GERD (gastroesophageal reflux disease)   . Heart  murmur    Aortic Sclerosis  . Hyperlipemia   . HYPOTHYROIDISM 05/29/2007  . Irritable bowel syndrome   . Migraines    hx of   . 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 PONV (postoperative nausea and vomiting)   . PVC (premature ventricular contraction)   . Type II or unspecified type diabetes mellitus with neurological manifestations, not stated as uncontrolled(250.60) 04/07/2013  . Unspecified essential hypertension    denies htn on 05/09/16   BP 121/66 (BP Location: Left Arm, Patient Position: Sitting, Cuff Size: Normal)   Pulse 68   SpO2 95%   Opioid Risk Score:  1 Fall Risk Score:  `1  Depression screen PHQ 2/9  Depression screen [facility] 2/9 02/28/2017 01/25/2017 01/02/2017 10/26/2016 07/04/2016 02/03/2016 10/23/2015  Decreased Interest 1 1 0 1 0 0 2  Down, Depressed, Hopeless 1 1 0 1 0 0 0  PHQ - 2 Score 2 2 0 2 0 0 2  Altered sleeping - - - - - - -  Tired, decreased energy - - - - - - -  Change in appetite - - - - - - -  Feeling bad or failure about yourself  - - - - - - -  Trouble concentrating - - - - - - -  Moving slowly or fidgety/restless - - - - - - -  Suicidal thoughts - - - - - - -  PHQ-9 Score - - - - - - -  Difficult doing work/chores - - - - - - -  Some recent data might be hidden    Review of Systems  HENT: Negative.   Eyes: Negative.   Respiratory: Negative.   Cardiovascular: Negative.   Gastrointestinal: Positive for constipation.  Endocrine: Negative.   Genitourinary:       Bladder control  Musculoskeletal: Negative.   Skin: Negative.   Allergic/Immunologic: Negative.   Neurological: Positive for tremors, weakness and numbness.       Tingling  Hematological: Negative.   Psychiatric/Behavioral: Positive for dysphoric mood.  All other systems reviewed and are negative.      Objective:   Physical Exam  Constitutional: She is oriented to person, place, and time. She appears well-developed and well-nourished.  HENT:  Head:  Normocephalic and atraumatic.  Neck: Normal range of motion. Neck supple.  Cervical Paraspinal Tenderness: C-5-C-6  Cardiovascular: Normal rate and regular rhythm.   Pulmonary/Chest: Effort normal and breath sounds normal.  Musculoskeletal:  Normal Muscle Bulk and Muscle Testing Reveals:  Upper Extremities: Full ROM and Muscle Strength 5/5 Right AC Joint Tenderness Thoracic Paraspinal Tenderness: T-7T-9 Lower Extremities: Full ROM and Muscle Strength 5/5 Arises from Table with ease Narrow Based Gait   Neurological: She is alert and oriented to person, place, and time.  Skin: Skin is warm and dry.  Psychiatric: She has a normal mood and affect.  Nursing note and vitals reviewed.         Assessment & Plan:  1. Cervical postlaminectomy syndrome: with neck and shoulder pain. 02/28/2017. Continue exercise routine and using heat therapy. Continue MS Contin 15 mg one tablet three times a day #90.  We will continue the opioid monitoring program, this consists of regular clinic visits, examinations, urine drug screen, pill counts as well as use of [place] Controlled Substance reporting System. 2. Fibromyalgia/ Neuropathy: Continue with Gabapentin, activity and exercise regime. 02/28/2017. 3. Right Shoulder Pain: Continue HEP and alternate heat and ice therapy. 02/28/2017 4. Bilateral Knee Pain/ Mild Degenerative Changes:.Continue Voltaren Gel.02/28/2017 5. Right Ankle Pain: No Complaints Today. Continue HEP.02/28/2017 6. Muscle Spasm: Continue Robaxin. 02/28/2017 7. Opioid Induced Constipation: Continue  Movantik: Samples given and education provided> Awaiting on decreased deductible.   20 minutes of face to face patient care time was spent during this visit. All questions were encouraged and answered.   F/U in 1 month

## 2017-03-01 ENCOUNTER — Ambulatory Visit (AMBULATORY_SURGERY_CENTER): Payer: Self-pay | Admitting: *Deleted

## 2017-03-01 ENCOUNTER — Telehealth: Payer: Self-pay | Admitting: *Deleted

## 2017-03-01 VITALS — Ht 65.0 in | Wt 227.6 lb

## 2017-03-01 DIAGNOSIS — Z1211 Encounter for screening for malignant neoplasm of colon: Secondary | ICD-10-CM

## 2017-03-01 MED ORDER — NA SULFATE-K SULFATE-MG SULF 17.5-3.13-1.6 GM/177ML PO SOLN: ORAL | 0 refills | Status: DC

## 2017-03-01 NOTE — Telephone Encounter (Signed)
Dr Havery Moros: pt is scheduled for recall colonoscopy at [facility] 03/13/17.  Pt is on 2 Liters O2 at bedtime b/c of  decrease sat when she sleeps due to narcotic usage.  She had EGD 04/2016 with you at [facility].  Do you want this pt to be scheduled for colonoscopy at [facility]?  Thanks, Juliann Pulse in [facility]

## 2017-03-01 NOTE — Telephone Encounter (Signed)
She has no FH of colon cancer. Last colonoscopy in 2008 was normal. Pending she has no symptoms, she would be a candidate for stool testing (FIT). If she wants to do a colonoscopy, you can put her in the next routine slot to be done at the hospital. If she wants to hope to avoid a colonoscopy, we can order a FIT test. If this is positive she would need a colonoscopy, but if negative she could use stool testing every year for her screening, which ever her preference. Thanks

## 2017-03-01 NOTE — Progress Notes (Signed)
Eggs cause diarrhea. Slow to wake up after anesthesia  Pt given Emmi instructions for colonoscopy  Uses 2 Liters of oxygen at bedtime  No diet drug use

## 2017-03-02 ENCOUNTER — Other Ambulatory Visit: Payer: Self-pay

## 2017-03-02 DIAGNOSIS — Z1211 Encounter for screening for malignant neoplasm of colon: Secondary | ICD-10-CM

## 2017-03-02 NOTE — Telephone Encounter (Signed)
ORDER PLACED FOR FECAL OCCULT BLOOD IN LABS-[facility]

## 2017-03-02 NOTE — Telephone Encounter (Signed)
Spoke with pt today- she has no symptoms, no bleeding, no GI issues- she wishes to do the FIT testing- explained to her she has to pick up a kit from the lab in the basement and the lab will explain the collection process. Called [name] to put in order for the FIt testing, cancelled scheduled colon   [name]  

## 2017-03-08 ENCOUNTER — Other Ambulatory Visit: Payer: Self-pay | Admitting: Family Medicine

## 2017-03-13 ENCOUNTER — Encounter: Payer: Medicare Other | Admitting: Gastroenterology

## 2017-03-24 DIAGNOSIS — J9610 Chronic respiratory failure, unspecified whether with hypoxia or hypercapnia: Secondary | ICD-10-CM | POA: Diagnosis not present

## 2017-03-27 ENCOUNTER — Encounter: Payer: Medicare Other | Attending: Physical Medicine & Rehabilitation | Admitting: Registered Nurse

## 2017-03-27 ENCOUNTER — Encounter: Payer: Self-pay | Admitting: Registered Nurse

## 2017-03-27 ENCOUNTER — Other Ambulatory Visit: Payer: Self-pay | Admitting: Registered Nurse

## 2017-03-27 VITALS — BP 120/72 | HR 63

## 2017-03-27 DIAGNOSIS — M25561 Pain in right knee: Secondary | ICD-10-CM

## 2017-03-27 DIAGNOSIS — M545 Low back pain, unspecified: Secondary | ICD-10-CM

## 2017-03-27 DIAGNOSIS — M797 Fibromyalgia: Secondary | ICD-10-CM | POA: Diagnosis not present

## 2017-03-27 DIAGNOSIS — Z5181 Encounter for therapeutic drug level monitoring: Secondary | ICD-10-CM | POA: Diagnosis not present

## 2017-03-27 DIAGNOSIS — G609 Hereditary and idiopathic neuropathy, unspecified: Secondary | ICD-10-CM | POA: Diagnosis not present

## 2017-03-27 DIAGNOSIS — K5903 Drug induced constipation: Secondary | ICD-10-CM | POA: Diagnosis not present

## 2017-03-27 DIAGNOSIS — M25562 Pain in left knee: Secondary | ICD-10-CM | POA: Diagnosis not present

## 2017-03-27 DIAGNOSIS — T402X5A Adverse effect of other opioids, initial encounter: Secondary | ICD-10-CM | POA: Diagnosis not present

## 2017-03-27 DIAGNOSIS — G8929 Other chronic pain: Secondary | ICD-10-CM | POA: Diagnosis not present

## 2017-03-27 DIAGNOSIS — G894 Chronic pain syndrome: Secondary | ICD-10-CM | POA: Insufficient documentation

## 2017-03-27 DIAGNOSIS — M7581 Other shoulder lesions, right shoulder: Secondary | ICD-10-CM | POA: Diagnosis not present

## 2017-03-27 DIAGNOSIS — M778 Other enthesopathies, not elsewhere classified: Secondary | ICD-10-CM

## 2017-03-27 DIAGNOSIS — Z79899 Other long term (current) drug therapy: Secondary | ICD-10-CM | POA: Diagnosis not present

## 2017-03-27 MED ORDER — GABAPENTIN 300 MG PO CAPS: 300.0000 mg | ORAL_CAPSULE | Freq: Three times a day (TID) | ORAL | 0 refills | Status: DC

## 2017-03-27 MED ORDER — MORPHINE SULFATE ER 15 MG PO TBCR: 15.0000 mg | EXTENDED_RELEASE_TABLET | Freq: Three times a day (TID) | ORAL | 0 refills | Status: DC

## 2017-03-27 MED ORDER — METHYLPREDNISOLONE 4 MG PO TBPK: ORAL_TABLET | ORAL | 0 refills | Status: DC

## 2017-03-27 NOTE — Progress Notes (Signed)
Subjective:    Patient ID: [name], female    DOB: 1950-12-13, 66 y.o.   MRN: [id number]  HPI: [name] is a 66year old female who returns for follow up appointmentfor chronic pain and medication refill. She states her pain is located in her neck radiating into her right shoulder and mid back pain. She rates her pain 5. Her current exercise regime is walking and performing stretching exercises.   Her last UDS was on 12/26/2016, it was consistent.   Pain Inventory Average Pain 5 Pain Right Now 5 My pain is constant, burning and aching  In the last 24 hours, has pain interfered with the following? General activity 5 Relation with others 5 Enjoyment of life 5 What TIME of day is your pain at its worst? evening and night Sleep (in general) Fair  Pain is worse with: walking, bending, sitting, standing and some activites Pain improves with: rest, heat/ice, therapy/exercise, pacing activities, medication, TENS and injections Relief from Meds: 5  Mobility walk without assistance how many minutes can you walk? 10-15 ability to climb steps?  yes do you drive?  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  no  Physicians involved in your care Any changes since last visit?  no   Family History  Problem Relation Age of Onset  . Breast cancer Mother   . Diabetes Mother   . Heart disease Mother   . Allergies Mother   . Diabetes Father   . Heart disease Father   . Emphysema Father   . Allergies Father   . Stomach cancer Maternal Uncle   . Breast cancer Paternal Aunt   . Colon cancer Neg Hx    Social History   Social History  . Marital status: Married    Spouse name: N/A  . Number of  children: N/A  . Years of education: N/A   Occupational History  . Bookkeeper    Social History Main Topics  . Smoking status: Never Smoker  . Smokeless tobacco: Never Used  . Alcohol use 0.0 oz/week     Comment: rare  . Drug use: No  . Sexual activity: Not on file   Other Topics Concern  . Not on file   Social History Narrative  . No narrative on file   Past Surgical History:  Procedure Laterality Date  . [place] STUDY N/A 01/04/2016   Procedure: [place] STUDY;  Surgeon: Manus Gunning, MD;  Location: [facility];  Service: Gastroenterology;  Laterality: N/A;  . ABDOMINAL HYSTERECTOMY  1988  . ADENOIDECTOMY    . ANTERIOR CERVICAL DECOMP/DISCECTOMY FUSION  2009, 1993   first procedure in ~ 1993/Dr Botero  . APPENDECTOMY  1988  . BACK SURGERY    . CHOLECYSTECTOMY  2005  . ESOPHAGEAL MANOMETRY N/A 01/04/2016   Procedure: ESOPHAGEAL MANOMETRY (EM) 24 HR PH;  Surgeon: Manus Gunning, MD;  Location: [facility];  Service: Gastroenterology;  Laterality: N/A;  . ESOPHAGOGASTRODUODENOSCOPY (EGD) WITH PROPOFOL N/A 05/17/2016   Procedure: ESOPHAGOGASTRODUODENOSCOPY (EGD) WITH PROPOFOL;  Surgeon: Manus Gunning, MD;  Location: [facility];  Service: Gastroenterology;  Laterality: N/A;  . NASAL SINUS SURGERY  2006  . SAVORY DILATION N/A 05/17/2016   Procedure: SAVORY DILATION;  Surgeon: Manus Gunning, MD;  Location: [facility];  Service: Gastroenterology;  Laterality: N/A;  . TONSILLECTOMY AND ADENOIDECTOMY    . TRANSTHORACIC ECHOCARDIOGRAM  2009   Normal LV size and function. EF 60-65%. No regional wall motion and amount is. Mild aortic sclerosis, no stenosis  . TUBAL LIGATION  1979   Past Medical History:  Diagnosis Date  . Allergy   . Anemia   . Arthritis   . B12 deficiency   . Complication of anesthesia    slow to wake up   . Depression   . Diabetes mellitus without complication ([place])    type II - diet controlled ; A1C 6.5 01/2017    . Fibromyalgia   . Generalized hyperhidrosis 04/17/2014  . GERD (gastroesophageal reflux disease)   . Heart murmur    Aortic Sclerosis  . Hyperlipemia   . HYPOTHYROIDISM 05/29/2007  . Irritable bowel syndrome   . Migraines    hx of   . Oxygen deficiency    2 L at bedtime  . 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 PONV (postoperative nausea and vomiting)   . PVC (premature ventricular contraction)   . Type II or unspecified type diabetes mellitus with neurological manifestations, not stated as uncontrolled(250.60) 04/07/2013  . Unspecified essential hypertension    denies htn on 05/09/16   BP 120/72   Pulse 63   SpO2 97%   Opioid Risk Score:  1 Fall Risk Score:  `1  Depression screen PHQ 2/9  Depression screen [facility] 2/9 03/27/2017 02/28/2017 01/25/2017 01/02/2017 10/26/2016 07/04/2016 02/03/2016  Decreased Interest 0 1 1 0 1 0 0  Down, Depressed, Hopeless 0 1 1 0 1 0 0  PHQ - 2 Score 0 2 2 0 2 0 0  Altered sleeping - - - - - - -  Tired, decreased energy - - - - - - -  Change in appetite - - - - - - -  Feeling bad or failure about yourself  - - - - - - -  Trouble concentrating - - - - - - -  Moving slowly or fidgety/restless - - - - - - -  Suicidal thoughts - - - - - - -  PHQ-9 Score - - - - - - -  Difficult doing work/chores - - - - - - -  Some recent data might be hidden    Review of Systems  HENT: Negative.   Eyes: Negative.   Respiratory: Negative.   Cardiovascular: Negative.   Gastrointestinal: Positive for constipation.  Endocrine: Negative.   Genitourinary:       Bladder control  Musculoskeletal: Negative.   Skin: Negative.   Allergic/Immunologic: Negative.   Neurological: Positive for tremors, weakness and numbness.       Tingling  Hematological: Negative.   Psychiatric/Behavioral: Positive for dysphoric mood.  All other systems reviewed and are negative.      Objective:   Physical Exam  Constitutional: She is oriented to person,  place, and time. She appears well-developed and well-nourished.  HENT:  Head: Normocephalic and atraumatic.  Neck: Normal range of motion. Neck supple.  Cervical Paraspinal Tenderness: C-5-C-6  Cardiovascular: Normal rate and regular rhythm.   Pulmonary/Chest: Effort normal and breath sounds normal.  Musculoskeletal: She exhibits edema.  Normal Muscle Bulk and Muscle Testing Reveals:  Upper Extremities: Full ROM and Muscle Strength 5/5 Right AC Joint Tenderness Thoracic Paraspinal Tenderness: T-7-T-9 Lower Extremities: Full  ROM and Muscle Strength 5/5 Arises from Table with ease Narrow Based Gait   Neurological: She is alert and oriented to person, place, and time.  Skin: Skin is warm and dry.  Psychiatric: She has a normal mood and affect.  Nursing note and vitals reviewed.         Assessment & Plan:  1. Cervical postlaminectomy syndrome: with neck and shoulder pain. 03/27/2017. Continue exercise routine and using heat therapy. Continue MS Contin 15 mg one tablet three times a day #90.  We will continue the opioid monitoring program, this consists of regular clinic visits, examinations, urine drug screen, pill counts as well as use of [place] Controlled Substance reporting System. 2. Fibromyalgia/ Neuropathy: Continue with Gabapentin, activity and exercise regime. 03/27/2017. 3. Right Shoulder Tendonitis: RX: Medrol Dose Pak: Continue HEP and alternate heat and ice therapy. 03/27/2017 4. Bilateral Knee Pain/ Mild Degenerative Changes:.No complaints today: Continue Voltaren Gel.03/27/2017 5. Right Ankle Pain: No Complaints Today. Continue HEP.03/27/2017 6. Muscle Spasm: Continue Robaxin. 03/27/2017 7. Opioid Induced Constipation: Continue  Movantik: 03/27/2017. 8. Midline Low Back Pain: Continue current medication regime, continue HEP as tolerated. 03/27/2017  20 nminutes of face to face patient care time was spent during this visit. All questions were encouraged and  answered.   F/U in 1 month

## 2017-04-05 ENCOUNTER — Other Ambulatory Visit: Payer: Medicare Other

## 2017-04-05 ENCOUNTER — Ambulatory Visit (INDEPENDENT_AMBULATORY_CARE_PROVIDER_SITE_OTHER): Payer: Medicare Other | Admitting: Pulmonary Disease

## 2017-04-05 ENCOUNTER — Encounter: Payer: Self-pay | Admitting: Pulmonary Disease

## 2017-04-05 VITALS — BP 114/74 | HR 63 | Ht 65.0 in | Wt 224.0 lb

## 2017-04-05 DIAGNOSIS — G4734 Idiopathic sleep related nonobstructive alveolar hypoventilation: Secondary | ICD-10-CM | POA: Diagnosis not present

## 2017-04-05 DIAGNOSIS — J9611 Chronic respiratory failure with hypoxia: Secondary | ICD-10-CM | POA: Diagnosis not present

## 2017-04-05 DIAGNOSIS — E662 Morbid (severe) obesity with alveolar hypoventilation: Secondary | ICD-10-CM | POA: Diagnosis not present

## 2017-04-05 NOTE — Progress Notes (Signed)
Current Outpatient Prescriptions on File Prior to Visit  Medication Sig  . ACCU-CHEK SOFTCLIX LANCETS lancets CHECK BLOOD SUGAR DAILY AS  DIRECTED  . B Complex Vitamins (B COMPLEX 100 PO) Take 1 tablet by mouth daily.  . Calcium Carbonate (CALCIUM 600 PO) Take 600 mg by mouth 2 (two) times daily.   . carboxymethylcellulose (REFRESH PLUS) 0.5 % SOLN Place 1 drop into both eyes 3 (three) times daily as needed.   . Cholecalciferol (VITAMIN D) 2000 UNITS tablet Take 2,000 Units by mouth daily.    . citalopram (CELEXA) 40 MG tablet TAKE 1 TABLET BY MOUTH  DAILY  . Coenzyme Q-10 100 MG capsule Take 100 mg by mouth daily.  . diclofenac sodium (VOLTAREN) 1 % GEL Apply 4 g topically 4 (four) times daily as needed.  . gabapentin (NEURONTIN) 300 MG capsule Take 1 capsule (300 mg total) by mouth 3 (three) times daily.  Marland Kitchen GRAPE SEED EXTRACT PO Take by mouth. 1 tablet daily  . levothyroxine (SYNTHROID, LEVOTHROID) 50 MCG tablet TAKE 1 TABLET BY MOUTH  DAILY  . loratadine (CLARITIN) 10 MG tablet Take 10 mg by mouth daily.  Marland Kitchen MAGNESIUM GLYCINATE PLUS PO Take 400 mg by mouth at bedtime.  . methocarbamol (ROBAXIN) 500 MG tablet TAKE ONE (1) TABLET BY MOUTH EVERY EIGHTHOURS AS NEEDED FOR MUSCLE SPASMS  . methylPREDNISolone (MEDROL DOSEPAK) 4 MG TBPK tablet Use as Directed  . mirtazapine (REMERON) 15 MG tablet TAKE 1 TABLET BY MOUTH AT  BEDTIME  . morphine (MS CONTIN) 15 MG 12 hr tablet Take 1 tablet (15 mg total) by mouth 3 (three) times daily.  . mupirocin ointment (BACTROBAN) 2 % Place 1 application into the nose 2 (two) times daily. for one week as needed for boils  . Na Sulfate-K Sulfate-Mg Sulf (SUPREP BOWEL PREP KIT) 17.5-3.13-1.6 GM/180ML SOLN suprep as directed.  No substitutions  . naloxegol oxalate (MOVANTIK) 12.5 MG TABS tablet Take 1 tablet (12.5 mg total) by mouth daily.  . NON FORMULARY Daith piercing in right ear for headaches  . oxybutynin (DITROPAN-XL) 10 MG 24 hr tablet TAKE ONE TABLET BY MOUTH  EVERY NIGHT AT BEDTIME  . RABEprazole (ACIPHEX) 20 MG tablet TAKE 1 TABLET BY MOUTH TWICE A DAY  . ranitidine (ZANTAC) 150 MG capsule Take 150 mg by mouth 2 (two) times daily.  . TURMERIC PO Take 1 capsule by mouth daily.   No current facility-administered medications on file prior to visit.     Chief Complaint  Patient presents with  . Follow-up    Pt is doing well overall. Pt would like to know if she is needing to carry the O2 machine with her when traveling for a week?    Sleep tests PSG 02/26/15 >> AHI 4.4, SaO2 low 83%. Spent 23.55mn with SpO2 <88%. ONO with RA 03/11/15 >> test time 8 hrs 50 min. Baseline SpO2 92%, low SpO2 78%. Spent 24 min with SpO2 < 88%.  Cardiac tests Echo 12/28/07 >> EF 60 to 65%, mild AR  Past medical history B12 deficiency, Depression, Fibromyalgia, GERD, IBS, HTN, HLD, Hypothyroidism, Migraine HA, DM  Past surgical history, Family history, Social history, Allergies reviewed  Vital signs BP 114/74 (BP Location: Left Arm, Cuff Size: Normal)   Pulse 63   Ht 5' 5"  (1.651 m)   Wt 224 lb (101.6 kg)   SpO2 97%   BMI 37.28 kg/m    History of Present Illness: Alicia ALESHIREis a 66y.o. female with chronic  respiratory failure 2nd to sleep related hypoxia on 2 liters oxygen at night.  She uses oxygen nightly. Feels this helps her sleep and daytime stamina.  She is not having cough, wheeze, chest pain.  She gets ankle swelling when she stands too long.  She is going on a trip to [facility].   Physical Exam:  General - pleasant Eyes - pupils reactive ENT - no sinus tenderness, no oral exudate, no LAN, MP 3 Cardiac - regular, no murmur Chest - no wheeze, rales Abd - soft, non tender Ext - no edema Skin - no rashes Neuro - normal strength Psych - normal mood    CMP Latest Ref Rng & Units 01/02/2017 04/18/2016 10/20/2014  Glucose 70 - 99 mg/dL 97 115(H) 133(H)  BUN 6 - 23 mg/dL 14 12 16   Creatinine 0.40 - 1.20 mg/dL 0.83 0.75 0.99  Sodium 135  - 145 mEq/L 140 141 137  Potassium 3.5 - 5.1 mEq/L 4.0 4.2 4.1  Chloride 96 - 112 mEq/L 101 102 102  CO2 19 - 32 mEq/L 33(H) 34(H) 32  Calcium 8.4 - 10.5 mg/dL 9.4 9.7 9.8  Total Protein 6.0 - 8.3 g/dL 6.5 6.8 -  Total Bilirubin 0.2 - 1.2 mg/dL 0.4 0.3 -  Alkaline Phos 39 - 117 U/L 115 98 -  AST 0 - 37 U/L 48(H) 33 -  ALT 0 - 35 U/L 41(H) 36(H) -    CBC Latest Ref Rng & Units 01/02/2017 04/18/2016 01/13/2014  WBC 4.0 - 10.5 K/uL 5.7 7.1 8.3  Hemoglobin 12.0 - 15.0 g/dL 11.6(L) 12.6 12.3  Hematocrit 36.0 - 46.0 % 34.1(L) 37.3 36.5  Platelets 150.0 - 400.0 K/uL 250.0 258.0 257.0    Assessment/plan  Chronic respiratory failure with hypoxia. - secondary to sleep related hypoxia from obesity hypoventilation syndrome and chronic opiate medication use - continue 2 liters oxygen at night - advised her to check with her DME about temporary portable oxygen set up for when she travels  Obesity. - discussed importance of weight loss  Patient Instructions  Follow up in 1 year    Chesley Mires, MD [place] 04/05/2017, 4:31 PM Pager:  [phone]

## 2017-04-05 NOTE — Patient Instructions (Signed)
Follow up in 1 year.

## 2017-04-10 ENCOUNTER — Other Ambulatory Visit: Payer: Self-pay | Admitting: Family Medicine

## 2017-04-12 ENCOUNTER — Encounter: Payer: Self-pay | Admitting: Registered Nurse

## 2017-04-18 ENCOUNTER — Other Ambulatory Visit (INDEPENDENT_AMBULATORY_CARE_PROVIDER_SITE_OTHER): Payer: Medicare Other

## 2017-04-18 ENCOUNTER — Encounter: Payer: Self-pay | Admitting: Gastroenterology

## 2017-04-18 DIAGNOSIS — Z1211 Encounter for screening for malignant neoplasm of colon: Secondary | ICD-10-CM | POA: Diagnosis not present

## 2017-04-18 LAB — FECAL OCCULT BLOOD, IMMUNOCHEMICAL: Fecal Occult Bld: NEGATIVE

## 2017-04-23 DIAGNOSIS — J9610 Chronic respiratory failure, unspecified whether with hypoxia or hypercapnia: Secondary | ICD-10-CM | POA: Diagnosis not present

## 2017-04-26 ENCOUNTER — Encounter: Payer: Self-pay | Admitting: Registered Nurse

## 2017-04-26 ENCOUNTER — Encounter (HOSPITAL_BASED_OUTPATIENT_CLINIC_OR_DEPARTMENT_OTHER): Payer: Medicare Other | Admitting: Registered Nurse

## 2017-04-26 DIAGNOSIS — G8929 Other chronic pain: Secondary | ICD-10-CM

## 2017-04-26 DIAGNOSIS — Z79899 Other long term (current) drug therapy: Secondary | ICD-10-CM

## 2017-04-26 DIAGNOSIS — M545 Low back pain, unspecified: Secondary | ICD-10-CM

## 2017-04-26 DIAGNOSIS — Z5181 Encounter for therapeutic drug level monitoring: Secondary | ICD-10-CM | POA: Diagnosis not present

## 2017-04-26 DIAGNOSIS — M797 Fibromyalgia: Secondary | ICD-10-CM

## 2017-04-26 DIAGNOSIS — M25561 Pain in right knee: Secondary | ICD-10-CM

## 2017-04-26 DIAGNOSIS — G894 Chronic pain syndrome: Secondary | ICD-10-CM

## 2017-04-26 DIAGNOSIS — G609 Hereditary and idiopathic neuropathy, unspecified: Secondary | ICD-10-CM | POA: Diagnosis not present

## 2017-04-26 DIAGNOSIS — T402X5A Adverse effect of other opioids, initial encounter: Secondary | ICD-10-CM | POA: Diagnosis not present

## 2017-04-26 DIAGNOSIS — K5903 Drug induced constipation: Secondary | ICD-10-CM | POA: Diagnosis not present

## 2017-04-26 DIAGNOSIS — M25562 Pain in left knee: Secondary | ICD-10-CM

## 2017-04-26 MED ORDER — MORPHINE SULFATE ER 15 MG PO TBCR: 15.0000 mg | EXTENDED_RELEASE_TABLET | Freq: Three times a day (TID) | ORAL | 0 refills | Status: DC

## 2017-04-26 NOTE — Progress Notes (Signed)
Subjective:    Patient ID: [name], female    DOB: 10/17/1950, 66 y.o.   MRN: [id number]  HPI: [name] is a 66year old female who returns for follow up appointmentfor chronic pain and medication refill. She states her pain is located in her mid back pain. She rates her pain 6. Her current exercise regime is walking and performing stretching exercises.   [name] Morphine equivalent is  45.00 MME.  Her last UDS was on 12/26/2016, it was consistent.   Pain Inventory Average Pain 5 Pain Right Now 6 My pain is constant, burning and aching  In the last 24 hours, has pain interfered with the following? General activity 7 Relation with others 7 Enjoyment of life 7 What TIME of day is your pain at its worst? evening, night Sleep (in general) Fair  Pain is worse with: walking, bending, sitting, standing and some activites Pain improves with: rest, heat/ice, therapy/exercise, pacing activities, medication, TENS and injections Relief from Meds: 5  Mobility walk without assistance how many minutes can you walk? 10-15 ability to climb steps?  yes do you drive?  yes Do you have any goals in this area?  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  yes  Neuro/Psych bladder control problems weakness numbness tremor tingling depression  Prior Studies Any changes since last visit?  no  Physicians involved in your care Any changes since last visit?  no   Family History  Problem Relation Age of Onset  . Breast cancer Mother   . Diabetes Mother   . Heart disease Mother   . Allergies Mother   . Diabetes Father   . Heart disease Father   . Emphysema Father   . Allergies Father   . Stomach cancer Maternal Uncle   . Breast cancer Paternal Aunt   . Colon cancer Neg Hx    Social  History   Social History  . Marital status: Married    Spouse name: N/A  . Number of children: N/A  . Years of education: N/A   Occupational History  . Bookkeeper    Social History Main Topics  . Smoking status: Never Smoker  . Smokeless tobacco: Never Used  . Alcohol use 0.0 oz/week     Comment: rare  . Drug use: No  . Sexual activity: Not Asked   Other Topics Concern  . None   Social History Narrative  . None   Past Surgical History:  Procedure Laterality Date  . [place] STUDY N/A 01/04/2016   Procedure: [place] STUDY;  Surgeon: Manus Gunning, MD;  Location: [facility];  Service: Gastroenterology;  Laterality: N/A;  . ABDOMINAL HYSTERECTOMY  1988  . ADENOIDECTOMY    . ANTERIOR CERVICAL DECOMP/DISCECTOMY FUSION  2009, 1993   first procedure in ~ 1993/Dr Botero  . APPENDECTOMY  1988  . BACK SURGERY    . CHOLECYSTECTOMY  2005  . ESOPHAGEAL MANOMETRY N/A 01/04/2016   Procedure: ESOPHAGEAL MANOMETRY (EM) 24 HR PH;  Surgeon: Manus Gunning, MD;  Location: [facility];  Service: Gastroenterology;  Laterality: N/A;  . ESOPHAGOGASTRODUODENOSCOPY (EGD) WITH PROPOFOL N/A 05/17/2016   Procedure: ESOPHAGOGASTRODUODENOSCOPY (EGD) WITH PROPOFOL;  Surgeon: Manus Gunning, MD;  Location: [facility];  Service: Gastroenterology;  Laterality: N/A;  .  NASAL SINUS SURGERY  2006  . SAVORY DILATION N/A 05/17/2016   Procedure: SAVORY DILATION;  Surgeon: Manus Gunning, MD;  Location: [facility];  Service: Gastroenterology;  Laterality: N/A;  . TONSILLECTOMY AND ADENOIDECTOMY    . TRANSTHORACIC ECHOCARDIOGRAM  2009   Normal LV size and function. EF 60-65%. No regional wall motion and amount is. Mild aortic sclerosis, no stenosis  . TUBAL LIGATION  1979   Past Medical History:  Diagnosis Date  . Allergy   . Anemia   . Arthritis   . B12 deficiency   . Complication of anesthesia    slow to wake up   . Depression   . Diabetes mellitus without  complication ([place])    type II - diet controlled ; A1C 6.5 01/2017  . Fibromyalgia   . Generalized hyperhidrosis 04/17/2014  . GERD (gastroesophageal reflux disease)   . Heart murmur    Aortic Sclerosis  . Hyperlipemia   . HYPOTHYROIDISM 05/29/2007  . Irritable bowel syndrome   . Migraines    hx of   . Oxygen deficiency    2 L at bedtime  . 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 PONV (postoperative nausea and vomiting)   . PVC (premature ventricular contraction)   . Type II or unspecified type diabetes mellitus with neurological manifestations, not stated as uncontrolled(250.60) 04/07/2013  . Unspecified essential hypertension    denies htn on 05/09/16   There were no vitals taken for this visit.  Opioid Risk Score:  1 Fall Risk Score:  `1  Depression screen PHQ 2/9  Depression screen [facility] 2/9 03/27/2017 02/28/2017 01/25/2017 01/02/2017 10/26/2016 07/04/2016 02/03/2016  Decreased Interest 0 1 1 0 1 0 0  Down, Depressed, Hopeless 0 1 1 0 1 0 0  PHQ - 2 Score 0 2 2 0 2 0 0  Altered sleeping - - - - - - -  Tired, decreased energy - - - - - - -  Change in appetite - - - - - - -  Feeling bad or failure about yourself  - - - - - - -  Trouble concentrating - - - - - - -  Moving slowly or fidgety/restless - - - - - - -  Suicidal thoughts - - - - - - -  PHQ-9 Score - - - - - - -  Difficult doing work/chores - - - - - - -  Some recent data might be hidden    Review of Systems  HENT: Negative.   Eyes: Negative.   Respiratory: Negative.   Cardiovascular: Positive for leg swelling.  Gastrointestinal: Positive for constipation.  Endocrine: Negative.   Genitourinary:       Bladder control  Musculoskeletal: Positive for arthralgias, back pain and neck pain.  Skin: Negative.   Allergic/Immunologic: Negative.   Neurological: Positive for tremors, weakness and numbness.       Tingling  Hematological: Negative.   Psychiatric/Behavioral: Positive for dysphoric mood.    All other systems reviewed and are negative.      Objective:   Physical Exam  Constitutional: She is oriented to person, place, and time. She appears well-developed and well-nourished.  HENT:  Head: Normocephalic and atraumatic.  Neck: Normal range of motion. Neck supple.  Cardiovascular: Normal rate and regular rhythm.   Pulmonary/Chest: Effort normal and breath sounds normal.  Musculoskeletal: She exhibits no edema.  Normal Muscle Bulk and Muscle Testing Reveals:  Upper  Extremities: Full ROM and Muscle Strength 5/5 Thoracic Paraspinal Tenderness: T-7-T-9 Lower Extremities: Full ROM and Muscle Strength 5/5 Arises from Table with ease Narrow Based Gait   Neurological: She is alert and oriented to person, place, and time.  Skin: Skin is warm and dry.  Psychiatric: She has a normal mood and affect.  Nursing note and vitals reviewed.         Assessment & Plan:  1. Cervical postlaminectomy syndrome: with neck and shoulder pain. 04/26/2017. Continue exercise routine and using heat therapy. Continue MS Contin 15 mg one tablet three times a day #90.  We will continue the opioid monitoring program, this consists of regular clinic visits, examinations, urine drug screen, pill counts as well as use of [place] Controlled Substance reporting System. 2. Fibromyalgia/ Neuropathy: Continue with Gabapentin, activity and exercise regime. 04/26/2017. 3. Bilateral Knee Pain/ Mild Degenerative Changes:.No complaints today: Continue Voltaren Gel.04/26/2017 5. Right Ankle Pain: No Complaints Today. Continue HEP.04/26/2017 6. Muscle Spasm: Continue Robaxin. 04/26/2017 7. Opioid Induced Constipation: Continue  Movantik: 04/26/2017. 8. Midline Low Back Pain: Continue current medication regime, continue HEP as tolerated. 04/26/2017  20 nminutes of face to face patient care time was spent during this visit. All questions were encouraged and answered.   F/U in 1 month

## 2017-04-28 ENCOUNTER — Ambulatory Visit: Payer: Medicare Other | Admitting: Registered Nurse

## 2017-05-03 NOTE — Progress Notes (Signed)
error 

## 2017-05-22 ENCOUNTER — Telehealth: Payer: Medicare Other | Admitting: Family

## 2017-05-22 DIAGNOSIS — J029 Acute pharyngitis, unspecified: Secondary | ICD-10-CM

## 2017-05-22 MED ORDER — PREDNISONE 5 MG PO TABS: 5.0000 mg | ORAL_TABLET | ORAL | 0 refills | Status: DC

## 2017-05-22 MED ORDER — BENZONATATE 100 MG PO CAPS: 100.0000 mg | ORAL_CAPSULE | Freq: Three times a day (TID) | ORAL | 0 refills | Status: DC | PRN

## 2017-05-22 NOTE — Progress Notes (Signed)
Thank you for the details you included in the comment boxes. Those details are very helpful in determining the best course of treatment for you and help [place] to provide the best care.  We are sorry that you are not feeling well.  Here is how we plan to help!  Based on your presentation I believe you most likely have A cough due to a virus.  This is called viral bronchitis and is best treated by rest, plenty of fluids and control of the cough.  You may use Ibuprofen or Tylenol as directed to help your symptoms.     In addition you may use A non-prescription cough medication called Mucinex DM: take 2 tablets every 12 hours. and A prescription cough medication called Tessalon Perles 100mg . You may take 1-2 capsules every 8 hours as needed for your cough.  Sterapred 5 mg dosepak  From your responses in the eVisit questionnaire you describe inflammation in the upper respiratory tract which is causing a significant cough.  This is commonly called Bronchitis and has four common causes:    Allergies  Viral Infections  Acid Reflux  Bacterial Infection Allergies, viruses and acid reflux are treated by controlling symptoms or eliminating the cause. An example might be a cough caused by taking certain blood pressure medications. You stop the cough by changing the medication. Another example might be a cough caused by acid reflux. Controlling the reflux helps control the cough.  USE OF BRONCHODILATOR ("RESCUE") INHALERS: There is a risk from using your bronchodilator too frequently.  The risk is that over-reliance on a medication which only relaxes the muscles surrounding the breathing tubes can reduce the effectiveness of medications prescribed to reduce swelling and congestion of the tubes themselves.  Although you feel brief relief from the bronchodilator inhaler, your asthma may actually be worsening with the tubes becoming more swollen and filled with mucus.  This can delay other crucial treatments, such  as oral steroid medications. If you need to use a bronchodilator inhaler daily, several times per day, you should discuss this with your provider.  There are probably better treatments that could be used to keep your asthma under control.     HOME CARE . Only take medications as instructed by your medical team. . Complete the entire course of an antibiotic. . Drink plenty of fluids and get plenty of rest. . Avoid close contacts especially the very young and the elderly . Cover your mouth if you cough or cough into your sleeve. . Always remember to wash your hands . A steam or ultrasonic humidifier can help congestion.   GET HELP RIGHT AWAY IF: . You develop worsening fever. . You become short of breath . You cough up blood. . Your symptoms persist after you have completed your treatment plan MAKE SURE YOU   Understand these instructions.  Will watch your condition.  Will get help right away if you are not doing well or get worse.  Your e-visit answers were reviewed by a board certified advanced clinical practitioner to complete your personal care plan.  Depending on the condition, your plan could have included both over the counter or prescription medications. If there is a problem please reply  once you have received a response from your provider. Your safety is important to [place].  If you have drug allergies check your prescription carefully.    You can use MyChart to ask questions about today's visit, request a non-urgent call back, or ask for a work  or school excuse for 24 hours related to this e-Visit. If it has been greater than 24 hours you will need to follow up with your provider, or enter a new e-Visit to address those concerns. You will get an e-mail in the next two days asking about your experience.  I hope that your e-visit has been valuable and will speed your recovery. Thank you for using e-visits.

## 2017-05-24 DIAGNOSIS — J9610 Chronic respiratory failure, unspecified whether with hypoxia or hypercapnia: Secondary | ICD-10-CM | POA: Diagnosis not present

## 2017-05-30 ENCOUNTER — Encounter: Payer: Medicare Other | Attending: Physical Medicine & Rehabilitation

## 2017-05-30 ENCOUNTER — Ambulatory Visit: Payer: Medicare Other | Admitting: Physical Medicine & Rehabilitation

## 2017-05-30 VITALS — BP 134/73 | HR 68

## 2017-05-30 DIAGNOSIS — Z79899 Other long term (current) drug therapy: Secondary | ICD-10-CM | POA: Insufficient documentation

## 2017-05-30 DIAGNOSIS — G894 Chronic pain syndrome: Secondary | ICD-10-CM | POA: Insufficient documentation

## 2017-05-30 DIAGNOSIS — Z5181 Encounter for therapeutic drug level monitoring: Secondary | ICD-10-CM | POA: Diagnosis not present

## 2017-05-30 DIAGNOSIS — M961 Postlaminectomy syndrome, not elsewhere classified: Secondary | ICD-10-CM

## 2017-05-30 MED ORDER — MORPHINE SULFATE ER 15 MG PO TBCR: 15.0000 mg | EXTENDED_RELEASE_TABLET | Freq: Three times a day (TID) | ORAL | 0 refills | Status: DC

## 2017-05-30 NOTE — Progress Notes (Signed)
Subjective:    Patient ID: [name], female    DOB: 07-17-1950, 66 y.o.   MRN: [id number]  HPI  CC  Mid Back pain  Secondary question about bulge on R hip Using therabands for exercise Does some walking 1/4 mi 2-3 times a week Goes to gym with husband less than once a week Was doing more freq bike and treadmill training 3 x per wk which caused some inflammation in the knees that took several months to resolve.  PCP prescribing celexa in am and mirtazepine at night Patient has had counseling in the past that she did not find helpful.  Was taking Movantik for bowels but this caused some sweating and she resumed [place] Pain Inventory Average Pain 5 Pain Right Now 4 My pain is constant, burning and aching  In the last 24 hours, has pain interfered with the following? General activity 7 Relation with others 7 Enjoyment of life 7 What TIME of day is your pain at its worst? evening and night Sleep (in general) Fair  Pain is worse with: walking, bending, sitting, inactivity, standing and some activites Pain improves with: rest, heat/ice, therapy/exercise, pacing activities, medication, TENS and injections Relief from Meds: 4  Mobility walk without assistance ability to climb steps?  yes do you drive?  yes  Function employed # of hrs/week . disabled: date disabled 2010 I need assistance with the following:  meal prep, household duties and shopping  Neuro/Psych bladder control problems weakness numbness tremor depression  Prior Studies Any changes since last visit?  no  Physicians involved in your care Any changes since last visit?  no   Family History  Problem Relation Age of Onset  . Breast cancer Mother   . Diabetes Mother   . Heart disease Mother   . Allergies Mother   . Diabetes Father   . Heart disease Father   . Emphysema Father   . Allergies Father   . Stomach cancer Maternal Uncle   . Breast cancer Paternal Aunt   . Colon cancer Neg Hx     Social History   Socioeconomic History  . Marital status: Married    Spouse name: Not on file  . Number of children: Not on file  . Years of education: Not on file  . Highest education level: Not on file  Social Needs  . Financial resource strain: Not on file  . Food insecurity - worry: Not on file  . Food insecurity - inability: Not on file  . Transportation needs - medical: Not on file  . Transportation needs - non-medical: Not on file  Occupational History  . Occupation: Radiation protection practitioner  Tobacco Use  . Smoking status: Never Smoker  . Smokeless tobacco: Never Used  Substance and Sexual Activity  . Alcohol use: Yes    Alcohol/week: 0.0 oz    Comment: rare  . Drug use: No  . Sexual activity: Not on file  Other Topics Concern  . Not on file  Social History Narrative  . Not on file   Past Surgical History:  Procedure Laterality Date  . [place] STUDY N/A 01/04/2016   Procedure: [place] STUDY;  Surgeon: Manus Gunning, MD;  Location: [facility];  Service: Gastroenterology;  Laterality: N/A;  . ABDOMINAL HYSTERECTOMY  1988  . ADENOIDECTOMY    . ANTERIOR CERVICAL DECOMP/DISCECTOMY FUSION  2009, 1993   first procedure in ~ 1993/Dr Botero  . APPENDECTOMY  1988  . BACK SURGERY    .  CHOLECYSTECTOMY  2005  . ESOPHAGEAL MANOMETRY N/A 01/04/2016   Procedure: ESOPHAGEAL MANOMETRY (EM) 24 HR PH;  Surgeon: Manus Gunning, MD;  Location: [facility];  Service: Gastroenterology;  Laterality: N/A;  . ESOPHAGOGASTRODUODENOSCOPY (EGD) WITH PROPOFOL N/A 05/17/2016   Procedure: ESOPHAGOGASTRODUODENOSCOPY (EGD) WITH PROPOFOL;  Surgeon: Manus Gunning, MD;  Location: [facility];  Service: Gastroenterology;  Laterality: N/A;  . NASAL SINUS SURGERY  2006  . SAVORY DILATION N/A 05/17/2016   Procedure: SAVORY DILATION;  Surgeon: Manus Gunning, MD;  Location: [facility];  Service: Gastroenterology;  Laterality: N/A;  . TONSILLECTOMY AND ADENOIDECTOMY    .  TRANSTHORACIC ECHOCARDIOGRAM  2009   Normal LV size and function. EF 60-65%. No regional wall motion and amount is. Mild aortic sclerosis, no stenosis  . TUBAL LIGATION  1979   Past Medical History:  Diagnosis Date  . Allergy   . Anemia   . Arthritis   . B12 deficiency   . Complication of anesthesia    slow to wake up   . Depression   . Diabetes mellitus without complication ([place])    type II - diet controlled ; A1C 6.5 01/2017  . Fibromyalgia   . Generalized hyperhidrosis 04/17/2014  . GERD (gastroesophageal reflux disease)   . Heart murmur    Aortic Sclerosis  . Hyperlipemia   . HYPOTHYROIDISM 05/29/2007  . Irritable bowel syndrome   . Migraines    hx of   . Oxygen deficiency    2 L at bedtime  . 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 PONV (postoperative nausea and vomiting)   . PVC (premature ventricular contraction)   . Type II or unspecified type diabetes mellitus with neurological manifestations, not stated as uncontrolled(250.60) 04/07/2013  . Unspecified essential hypertension    denies htn on 05/09/16   There were no vitals taken for this visit.  Opioid Risk Score:   Fall Risk Score:  `1  Depression screen PHQ 2/9  Depression screen [facility] 2/9 03/27/2017 02/28/2017 01/25/2017 01/02/2017 10/26/2016 07/04/2016 02/03/2016  Decreased Interest 0 1 1 0 1 0 0  Down, Depressed, Hopeless 0 1 1 0 1 0 0  PHQ - 2 Score 0 2 2 0 2 0 0  Altered sleeping - - - - - - -  Tired, decreased energy - - - - - - -  Change in appetite - - - - - - -  Feeling bad or failure about yourself  - - - - - - -  Trouble concentrating - - - - - - -  Moving slowly or fidgety/restless - - - - - - -  Suicidal thoughts - - - - - - -  PHQ-9 Score - - - - - - -  Difficult doing work/chores - - - - - - -  Some recent data might be hidden  \  Review of Systems  Constitutional: Positive for diaphoresis.  HENT: Negative.   Eyes: Negative.   Respiratory: Positive for cough.     Cardiovascular: Negative.   Gastrointestinal: Negative.   Endocrine: Negative.   Genitourinary: Negative.   Musculoskeletal: Positive for joint swelling.  Skin: Negative.   Allergic/Immunologic: Negative.   Neurological: Negative.   Hematological: Negative.   Psychiatric/Behavioral: Negative.   All other systems reviewed and are negative.      Objective:   Physical Exam  Constitutional: She is oriented to person, place, and time. She appears well-developed and  well-nourished.  HENT:  Head: Normocephalic and atraumatic.  Eyes: Conjunctivae and EOM are normal. Pupils are equal, round, and reactive to light.  Neck: Normal range of motion.  Musculoskeletal:  Tenderness over the thoracic paraspinal muscles.  Patient has good lumbar flexion extension is limited  Neurological: She is alert and oriented to person, place, and time. She displays atrophy. She displays no tremor. No sensory deficit. She exhibits normal muscle tone.  Psychiatric: She has a normal mood and affect.  Nursing note and vitals reviewed.   Patient has decreased left hip abduction versus right side. She has no tenderness to palpation over the lumbar paraspinal or in the gluteus musculature. The right gluteus medius has normal muscle bulk of the left gluteus  medius has reduced muscle bulk No masses tenderness or skin changes on R hip  Gait is without evidence of toe drag or knee instability.  No Trendelenburg gait.  Mood and affect are appropriate      Assessment & Plan:  1.  Cervical postlaminectomy syndrome with chronic postoperative pain.  She does have some radiating pain down into the thoracic area but also has thoracic myofascial pain We discussed her exercises this includes theraband for periscapular muscle strengthening.  Also recommend foam roller to be used for the upper back therapy and exercises 2.  Left gluteus medius weakness and atrophy.  Have instructed patient to do hip abduction exercise on  the left side either from a standing or a side lying position.  She is to do this every other day.  She can start with about 10-15 reps then work up to a total of 30 reps may need to use ankle weights to increase resistance 3.  Chronic pain syndrome multifactorial Cont MS Contin 15mg  TID Reviewed PMP website Continue opioid monitoring program. This consists of regular clinic visits, examinations, urine drug screen, pill counts as well as use of [place] controlled substance reporting System. UDS in July was consistent

## 2017-05-30 NOTE — Patient Instructions (Signed)
We discussed trying elliptical  Also avoid repetitive knee flexion of more than 30 degrees  Use foam roller after theraband exercise  Also do Left gluteus medius exercises at least every other day on left side

## 2017-06-15 ENCOUNTER — Other Ambulatory Visit: Payer: Self-pay | Admitting: Family Medicine

## 2017-06-15 DIAGNOSIS — Z1231 Encounter for screening mammogram for malignant neoplasm of breast: Secondary | ICD-10-CM

## 2017-06-16 ENCOUNTER — Ambulatory Visit
Admission: RE | Admit: 2017-06-16 | Discharge: 2017-06-16 | Disposition: A | Discharge status: Home / Self Care | Payer: Medicare Other | Source: Ambulatory Visit | Attending: Family Medicine | Admitting: Family Medicine

## 2017-06-16 DIAGNOSIS — Z1231 Encounter for screening mammogram for malignant neoplasm of breast: Secondary | ICD-10-CM | POA: Diagnosis not present

## 2017-06-23 ENCOUNTER — Other Ambulatory Visit: Payer: Self-pay | Admitting: Registered Nurse

## 2017-06-23 ENCOUNTER — Encounter: Payer: Medicare Other | Admitting: Registered Nurse

## 2017-06-23 DIAGNOSIS — J9610 Chronic respiratory failure, unspecified whether with hypoxia or hypercapnia: Secondary | ICD-10-CM | POA: Diagnosis not present

## 2017-06-24 ENCOUNTER — Encounter: Payer: Self-pay | Admitting: Registered Nurse

## 2017-06-28 ENCOUNTER — Encounter: Payer: Self-pay | Admitting: Registered Nurse

## 2017-06-28 ENCOUNTER — Encounter: Payer: Medicare Other | Attending: Physical Medicine & Rehabilitation | Admitting: Registered Nurse

## 2017-06-28 VITALS — BP 129/69 | HR 61

## 2017-06-28 DIAGNOSIS — G8929 Other chronic pain: Secondary | ICD-10-CM

## 2017-06-28 DIAGNOSIS — M797 Fibromyalgia: Secondary | ICD-10-CM

## 2017-06-28 DIAGNOSIS — M545 Low back pain, unspecified: Secondary | ICD-10-CM

## 2017-06-28 DIAGNOSIS — Z79899 Other long term (current) drug therapy: Secondary | ICD-10-CM

## 2017-06-28 DIAGNOSIS — M961 Postlaminectomy syndrome, not elsewhere classified: Secondary | ICD-10-CM

## 2017-06-28 DIAGNOSIS — G609 Hereditary and idiopathic neuropathy, unspecified: Secondary | ICD-10-CM

## 2017-06-28 DIAGNOSIS — M25562 Pain in left knee: Secondary | ICD-10-CM | POA: Diagnosis not present

## 2017-06-28 DIAGNOSIS — M25561 Pain in right knee: Secondary | ICD-10-CM

## 2017-06-28 DIAGNOSIS — G894 Chronic pain syndrome: Secondary | ICD-10-CM | POA: Insufficient documentation

## 2017-06-28 DIAGNOSIS — Z5181 Encounter for therapeutic drug level monitoring: Secondary | ICD-10-CM | POA: Insufficient documentation

## 2017-06-28 MED ORDER — MORPHINE SULFATE ER 15 MG PO TBCR: 15.0000 mg | EXTENDED_RELEASE_TABLET | Freq: Three times a day (TID) | ORAL | 0 refills | Status: DC

## 2017-06-28 NOTE — Progress Notes (Signed)
Subjective:    Patient ID: [name], female    DOB: [birth year], 67 y.o.   MRN: [id number]  HPI: [name] is a 67year old female who returns for follow up appointmentfor chronic pain and medication refill. She states her pain is located in her right hip ( bulge), mid back pain and right knee. She rates her pain 4. Her current exercise regime is walking and performing stretching exercises with bands, going to the [facility] weekly using the Treadmill for 15 minutes and riding the recumbent bicycle.  Encouraged to continue HEP as tolerated, instructions given and reviewed from last month, she verbalizes understanding.   [name] Morphine equivalent is  45.00 MME.  Her last UDS was on 12/26/2016, it was consistent.   Pain Inventory Average Pain 7 Pain Right Now 4 My pain is constant, burning and aching  In the last 24 hours, has pain interfered with the following? General activity 7 Relation with others 6 Enjoyment of life 6 What TIME of day is your pain at its worst? evening, night Sleep (in general) Fair  Pain is worse with: walking, bending, sitting, standing and some activites Pain improves with: rest, heat/ice, therapy/exercise, pacing activities, medication, TENS and injections Relief from Meds: 5  Mobility walk without assistance how many minutes can you walk? 10-15 ability to climb steps?  yes do you drive?  yes Do you have any goals in this area?  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  yes  Neuro/Psych bladder control problems weakness numbness tremor tingling depression  Prior Studies Any changes since last visit?  no  Physicians involved in your care Any changes since last visit?  no   Family History  Problem Relation Age of Onset  . Breast cancer Mother   .  Diabetes Mother   . Heart disease Mother   . Allergies Mother   . Diabetes Father   . Heart disease Father   . Emphysema Father   . Allergies Father   . Stomach cancer Maternal Uncle   . Breast cancer Paternal Aunt   . Colon cancer Neg Hx    Social History   Socioeconomic History  . Marital status: Married    Spouse name: Not on file  . Number of children: Not on file  . Years of education: Not on file  . Highest education level: Not on file  Social Needs  . Financial resource strain: Not on file  . Food insecurity - worry: Not on file  . Food insecurity - inability: Not on file  . Transportation needs - medical: Not on file  . Transportation needs - non-medical: Not on file  Occupational History  . Occupation: Radiation protection practitioner  Tobacco Use  . Smoking status: Never Smoker  . Smokeless tobacco: Never Used  Substance and Sexual Activity  . Alcohol use: Yes    Alcohol/week: 0.0 oz    Comment: rare  . Drug use: No  . Sexual activity: Not on file  Other Topics Concern  . Not on file  Social History Narrative  . Not on file   Past Surgical History:  Procedure Laterality Date  . [place] STUDY N/A 01/04/2016   Procedure: [place] STUDY;  Surgeon: Alicia Gunning, MD;  Location: [facility];  Service: Gastroenterology;  Laterality: N/A;  . ABDOMINAL HYSTERECTOMY  1988  . ADENOIDECTOMY    . ANTERIOR CERVICAL DECOMP/DISCECTOMY FUSION  2009, 1993   first procedure in ~ 1993/Dr Botero  . APPENDECTOMY  1988  . BACK SURGERY    . CHOLECYSTECTOMY  2005  . ESOPHAGEAL MANOMETRY N/A 01/04/2016   Procedure: ESOPHAGEAL MANOMETRY (EM) 24 HR PH;  Surgeon: Alicia Gunning, MD;  Location: [facility];  Service: Gastroenterology;  Laterality: N/A;  . ESOPHAGOGASTRODUODENOSCOPY (EGD) WITH PROPOFOL N/A 05/17/2016   Procedure: ESOPHAGOGASTRODUODENOSCOPY (EGD) WITH PROPOFOL;  Surgeon: Alicia Gunning, MD;  Location: [facility];  Service: Gastroenterology;  Laterality:  N/A;  . NASAL SINUS SURGERY  2006  . SAVORY DILATION N/A 05/17/2016   Procedure: SAVORY DILATION;  Surgeon: Alicia Gunning, MD;  Location: [facility];  Service: Gastroenterology;  Laterality: N/A;  . TONSILLECTOMY AND ADENOIDECTOMY    . TRANSTHORACIC ECHOCARDIOGRAM  2009   Normal LV size and function. EF 60-65%. No regional wall motion and amount is. Mild aortic sclerosis, no stenosis  . TUBAL LIGATION  1979   Past Medical History:  Diagnosis Date  . Allergy   . Anemia   . Arthritis   . B12 deficiency   . Complication of anesthesia    slow to wake up   . Depression   . Diabetes mellitus without complication ([place])    type II - diet controlled ; A1C 6.5 01/2017  . Fibromyalgia   . Generalized hyperhidrosis 04/17/2014  . GERD (gastroesophageal reflux disease)   . Heart murmur    Aortic Sclerosis  . Hyperlipemia   . HYPOTHYROIDISM 05/29/2007  . Irritable bowel syndrome   . Migraines    hx of   . Oxygen deficiency    2 L at bedtime  . 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 PONV (postoperative nausea and vomiting)   . PVC (premature ventricular contraction)   . Type II or unspecified type diabetes mellitus with neurological manifestations, not stated as uncontrolled(250.60) 04/07/2013  . Unspecified essential hypertension    denies htn on 05/09/16   There were no vitals taken for this visit.  Opioid Risk Score:  1 Fall Risk Score:  `1  Depression screen PHQ 2/9  Depression screen [facility] 2/9 03/27/2017 02/28/2017 01/25/2017 01/02/2017 10/26/2016 07/04/2016 02/03/2016  Decreased Interest 0 1 1 0 1 0 0  Down, Depressed, Hopeless 0 1 1 0 1 0 0  PHQ - 2 Score 0 2 2 0 2 0 0  Altered sleeping - - - - - - -  Tired, decreased energy - - - - - - -  Change in appetite - - - - - - -  Feeling bad or failure about yourself  - - - - - - -  Trouble concentrating - - - - - - -  Moving slowly or fidgety/restless - - - - - - -  Suicidal thoughts - - - - - - -    PHQ-9 Score - - - - - - -  Difficult doing work/chores - - - - - - -  Some recent data might be hidden    Review of Systems  HENT: Negative.   Eyes: Negative.   Respiratory: Negative.   Cardiovascular: Positive for leg swelling.  Gastrointestinal: Positive for constipation.  Endocrine: Negative.   Genitourinary:       Bladder control  Musculoskeletal: Positive for arthralgias, back pain, joint swelling and neck pain.  Skin: Negative.   Allergic/Immunologic: Negative.   Neurological: Positive for tremors, weakness and numbness.       Tingling  Hematological: Negative.   Psychiatric/Behavioral: Positive for dysphoric mood.  All other systems reviewed and are negative.      Objective:   Physical Exam  Constitutional: She is oriented to person, place, and time. She appears well-developed and well-nourished.  HENT:  Head: Normocephalic and atraumatic.  Neck: Normal range of motion. Neck supple.  Cardiovascular: Normal rate and regular rhythm.  Pulmonary/Chest: Effort normal and breath sounds normal.  Musculoskeletal: She exhibits no edema.  Normal Muscle Bulk and Muscle Testing Reveals:  Upper Extremities: Full ROM and Muscle Strength 5/5 Thoracic Paraspinal Tenderness: T-3-T-7 Lower Extremities: Full ROM and Muscle Strength 5/5 Arises from Table with ease Narrow Based Gait   Neurological: She is alert and oriented to person, place, and time.  Skin: Skin is warm and dry.  Psychiatric: She has a normal mood and affect.  Nursing note and vitals reviewed.         Assessment & Plan:  1. Cervical postlaminectomy syndrome: with neck and shoulder pain. 06/28/2017. Continue exercise routine and using heat therapy. Continue MS Contin 15 mg one tablet three times a day #90.  We will continue the opioid monitoring program, this consists of regular clinic visits, examinations, urine drug screen, pill counts as well as use of [place] Controlled Substance reporting  System. 2. Fibromyalgia/ Neuropathy: Continue with Gabapentin, activity and exercise regime. 06/28/2017. 3. Right Knee Pain/ Mild Degenerative Changes:.Continue Voltaren Gel.06/28/2017 4. Right Ankle Pain: No Complaints Today. Continue HEP.06/28/2017 5. Muscle Spasm: Continue Robaxin. 06/28/2017 6. Opioid Induced Constipation: No complaints Continue to Monitor. 06/28/2017. 7. Midline Low Back Pain: Continue current medication regime, continue HEP as tolerated. 06/28/2017 8. Right Gluteal Medius Weakness and Atrophy: Continue with Hip Abduction Exercises: Continue to Monitor. 06/28/2017.  20 minutes of face to face patient care time was spent during this visit. All questions were encouraged and answered.   F/U in 1 month

## 2017-07-24 DIAGNOSIS — J9610 Chronic respiratory failure, unspecified whether with hypoxia or hypercapnia: Secondary | ICD-10-CM | POA: Diagnosis not present

## 2017-07-31 ENCOUNTER — Encounter: Payer: Self-pay | Admitting: Registered Nurse

## 2017-07-31 ENCOUNTER — Encounter: Payer: Medicare Other | Attending: Physical Medicine & Rehabilitation | Admitting: Registered Nurse

## 2017-07-31 VITALS — BP 124/73 | HR 71 | Temp 98.9°F

## 2017-07-31 DIAGNOSIS — M797 Fibromyalgia: Secondary | ICD-10-CM

## 2017-07-31 DIAGNOSIS — M961 Postlaminectomy syndrome, not elsewhere classified: Secondary | ICD-10-CM | POA: Diagnosis not present

## 2017-07-31 DIAGNOSIS — G8929 Other chronic pain: Secondary | ICD-10-CM

## 2017-07-31 DIAGNOSIS — G609 Hereditary and idiopathic neuropathy, unspecified: Secondary | ICD-10-CM | POA: Diagnosis not present

## 2017-07-31 DIAGNOSIS — G894 Chronic pain syndrome: Secondary | ICD-10-CM | POA: Insufficient documentation

## 2017-07-31 DIAGNOSIS — M545 Low back pain, unspecified: Secondary | ICD-10-CM

## 2017-07-31 DIAGNOSIS — M25561 Pain in right knee: Secondary | ICD-10-CM | POA: Diagnosis not present

## 2017-07-31 DIAGNOSIS — Z5181 Encounter for therapeutic drug level monitoring: Secondary | ICD-10-CM | POA: Insufficient documentation

## 2017-07-31 DIAGNOSIS — Z79899 Other long term (current) drug therapy: Secondary | ICD-10-CM | POA: Diagnosis not present

## 2017-07-31 DIAGNOSIS — M25562 Pain in left knee: Secondary | ICD-10-CM | POA: Diagnosis not present

## 2017-07-31 DIAGNOSIS — Z79891 Long term (current) use of opiate analgesic: Secondary | ICD-10-CM | POA: Diagnosis not present

## 2017-07-31 MED ORDER — MORPHINE SULFATE ER 15 MG PO TBCR: 15.0000 mg | EXTENDED_RELEASE_TABLET | Freq: Three times a day (TID) | ORAL | 0 refills | Status: DC

## 2017-07-31 NOTE — Progress Notes (Signed)
Subjective:    Patient ID: [name], female    DOB: [birth year], 67 y.o.   MRN: [id number]  HPI: [name] is a 67year old female who returns for follow up appointmentfor chronic pain and medication refill. She states her pain is located in her neck radiating into her right shoulder and mid-back. She rates her pain 5. Her current exercise regime is walking.  Ms. Callins Morphine equivalent is  45.00 MME.  Her last UDS was on 12/26/2016, it was consistent. UDS ordered for today.   Pain Inventory Average Pain 5 Pain Right Now 5 My pain is constant, burning and aching  In the last 24 hours, has pain interfered with the following? General activity 5 Relation with others 5 Enjoyment of life 5 What TIME of day is your pain at its worst? evening, night Sleep (in general) Fair  Pain is worse with: walking, bending, sitting, standing and some activites Pain improves with: rest, heat/ice, therapy/exercise, pacing activities, medication, TENS and injections Relief from Meds: 5  Mobility walk without assistance how many minutes can you walk? 10-15 ability to climb steps?  yes do you drive?  yes Do you have any goals in this area?  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  yes  Neuro/Psych bladder control problems weakness numbness tremor tingling depression  Prior Studies Any changes since last visit?  no  Physicians involved in your care Any changes since last visit?  no   Family History  Problem Relation Age of Onset  . Breast cancer Mother   . Diabetes Mother   . Heart disease Mother   . Allergies Mother   . Diabetes Father   . Heart disease Father   . Emphysema Father   . Allergies Father   . Stomach cancer Maternal Uncle   . Breast cancer Paternal Aunt   . Colon  cancer Neg Hx    Social History   Socioeconomic History  . Marital status: Married    Spouse name: None  . Number of children: None  . Years of education: None  . Highest education level: None  Social Needs  . Financial resource strain: None  . Food insecurity - worry: None  . Food insecurity - inability: None  . Transportation needs - medical: None  . Transportation needs - non-medical: None  Occupational History  . Occupation: Radiation protection practitioner  Tobacco Use  . Smoking status: Never Smoker  . Smokeless tobacco: Never Used  Substance and Sexual Activity  . Alcohol use: Yes    Alcohol/week: 0.0 oz    Comment: rare  . Drug use: No  . Sexual activity: None  Other Topics Concern  . None  Social History Narrative  . None   Past Surgical History:  Procedure Laterality Date  . [place] STUDY N/A 01/04/2016   Procedure: [place] STUDY;  Surgeon: Manus Gunning, MD;  Location: [facility];  Service: Gastroenterology;  Laterality: N/A;  . ABDOMINAL HYSTERECTOMY  1988  . ADENOIDECTOMY    . ANTERIOR CERVICAL DECOMP/DISCECTOMY FUSION  2009, 1993   first procedure in ~ 1993/Dr Botero  . APPENDECTOMY  1988  . BACK SURGERY    . CHOLECYSTECTOMY  2005  . ESOPHAGEAL MANOMETRY N/A 01/04/2016   Procedure: ESOPHAGEAL MANOMETRY (EM) 24 HR PH;  Surgeon: Manus Gunning, MD;  Location: [facility];  Service: Gastroenterology;  Laterality: N/A;  . ESOPHAGOGASTRODUODENOSCOPY (EGD) WITH PROPOFOL N/A 05/17/2016   Procedure: ESOPHAGOGASTRODUODENOSCOPY (EGD) WITH PROPOFOL;  Surgeon: Manus Gunning, MD;  Location: [facility];  Service: Gastroenterology;  Laterality: N/A;  . NASAL SINUS SURGERY  2006  . SAVORY DILATION N/A 05/17/2016   Procedure: SAVORY DILATION;  Surgeon: Manus Gunning, MD;  Location: [facility];  Service: Gastroenterology;  Laterality: N/A;  . TONSILLECTOMY AND ADENOIDECTOMY    . TRANSTHORACIC ECHOCARDIOGRAM  2009   Normal LV size and function.  EF 60-65%. No regional wall motion and amount is. Mild aortic sclerosis, no stenosis  . TUBAL LIGATION  1979   Past Medical History:  Diagnosis Date  . Allergy   . Anemia   . Arthritis   . B12 deficiency   . Complication of anesthesia    slow to wake up   . Depression   . Diabetes mellitus without complication ([place])    type II - diet controlled ; A1C 6.5 01/2017  . Fibromyalgia   . Generalized hyperhidrosis 04/17/2014  . GERD (gastroesophageal reflux disease)   . Heart murmur    Aortic Sclerosis  . Hyperlipemia   . HYPOTHYROIDISM 05/29/2007  . Irritable bowel syndrome   . Migraines    hx of   . Oxygen deficiency    2 L at bedtime  . 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 PONV (postoperative nausea and vomiting)   . PVC (premature ventricular contraction)   . Type II or unspecified type diabetes mellitus with neurological manifestations, not stated as uncontrolled(250.60) 04/07/2013  . Unspecified essential hypertension    denies htn on 05/09/16   BP 124/73   Pulse 71   Temp 98.9 F (37.2 C)   SpO2 95%   Opioid Risk Score:  1 Fall Risk Score:  `1  Depression screen PHQ 2/9  Depression screen [facility] 2/9 03/27/2017 02/28/2017 01/25/2017 01/02/2017 10/26/2016 07/04/2016 02/03/2016  Decreased Interest 0 1 1 0 1 0 0  Down, Depressed, Hopeless 0 1 1 0 1 0 0  PHQ - 2 Score 0 2 2 0 2 0 0  Altered sleeping - - - - - - -  Tired, decreased energy - - - - - - -  Change in appetite - - - - - - -  Feeling bad or failure about yourself  - - - - - - -  Trouble concentrating - - - - - - -  Moving slowly or fidgety/restless - - - - - - -  Suicidal thoughts - - - - - - -  PHQ-9 Score - - - - - - -  Difficult doing work/chores - - - - - - -  Some recent data might be hidden    Review of Systems  HENT: Negative.   Eyes: Negative.   Respiratory: Negative.   Cardiovascular: Positive for leg swelling.  Gastrointestinal: Positive for constipation.  Endocrine:  Negative.   Genitourinary:       Bladder control  Musculoskeletal: Positive for arthralgias, back pain, joint swelling and neck pain.  Skin: Negative.   Allergic/Immunologic: Negative.   Neurological: Positive for tremors, weakness and numbness.       Tingling  Hematological: Negative.   Psychiatric/Behavioral: Positive for dysphoric mood.  All other systems reviewed and are negative.      Objective:   Physical Exam  Constitutional: She  is oriented to person, place, and time. She appears well-developed and well-nourished.  HENT:  Head: Normocephalic and atraumatic.  Neck: Normal range of motion. Neck supple.  Cervical Paraspinal Tenderness: C-5-C-6  Cardiovascular: Normal rate and regular rhythm.  Pulmonary/Chest: Effort normal and breath sounds normal.  Musculoskeletal: She exhibits no edema.  Normal Muscle Bulk and Muscle Testing Reveals:  Upper Extremities: Full ROM and Muscle Strength 5/5 Right AC Joint Tenderness Thoracic Paraspinal Tenderness: T-1-T-3 Lower Extremities: Full ROM and Muscle Strength 5/5 Arises from Table with ease Narrow Based Gait   Neurological: She is alert and oriented to person, place, and time.  Skin: Skin is warm and dry.  Psychiatric: She has a normal mood and affect.  Nursing note and vitals reviewed.         Assessment & Plan:  1. Cervical postlaminectomy syndrome: with neck and shoulder pain. 07/31/2017. Continue exercise routine and using heat therapy. Continue MS Contin 15 mg one tablet three times a day #90.  We will continue the opioid monitoring program, this consists of regular clinic visits, examinations, urine drug screen, pill counts as well as use of [place] Controlled Substance reporting System. 2. Fibromyalgia/ Neuropathy: Continue with Gabapentin, activity and exercise regime. 07/31/2017. 3. Bilateral Knee Pain/ Mild Degenerative Changes:.Continue Voltaren Gel.07/31/2017 4. Right Ankle Pain: No Complaints Today.  Continue HEP.07/31/2017 5. Muscle Spasm: Continue Robaxin. 07/31/2017 6. Opioid Induced Constipation: No complaints Continue to Monitor. 07/31/2017. 7. Midline Low Back Pain: Continue current medication regime, continue HEP as tolerated. 07/31/2017 8. Right Gluteal Medius Weakness and Atrophy: Continue with Hip Abduction Exercises: Continue to Monitor. 07/31/2017.  20 minutes of face to face patient care time was spent during this visit. All questions were encouraged and answered.   F/U in 1 month

## 2017-08-04 LAB — 6-ACETYLMORPHINE,TOXASSURE ADD
6-ACETYLMORPHINE: NEGATIVE
6-acetylmorphine: NOT DETECTED ng/mg creat

## 2017-08-04 LAB — TOXASSURE SELECT,+ANTIDEPR,UR

## 2017-08-08 ENCOUNTER — Telehealth: Payer: Self-pay | Admitting: *Deleted

## 2017-08-08 NOTE — Telephone Encounter (Signed)
Urine drug screen for this encounter is consistent for prescribed medication 

## 2017-08-09 LAB — HM DIABETES EYE EXAM

## 2017-08-14 ENCOUNTER — Encounter: Payer: Self-pay | Admitting: Family Medicine

## 2017-08-23 ENCOUNTER — Encounter: Payer: Medicare Other | Admitting: Registered Nurse

## 2017-08-23 ENCOUNTER — Encounter: Payer: Self-pay | Admitting: Registered Nurse

## 2017-08-23 VITALS — BP 135/77 | HR 69 | Resp 14

## 2017-08-23 DIAGNOSIS — Z5181 Encounter for therapeutic drug level monitoring: Secondary | ICD-10-CM | POA: Diagnosis not present

## 2017-08-23 DIAGNOSIS — M797 Fibromyalgia: Secondary | ICD-10-CM | POA: Diagnosis not present

## 2017-08-23 DIAGNOSIS — M25561 Pain in right knee: Secondary | ICD-10-CM | POA: Diagnosis not present

## 2017-08-23 DIAGNOSIS — M545 Low back pain, unspecified: Secondary | ICD-10-CM

## 2017-08-23 DIAGNOSIS — G894 Chronic pain syndrome: Secondary | ICD-10-CM

## 2017-08-23 DIAGNOSIS — Z79891 Long term (current) use of opiate analgesic: Secondary | ICD-10-CM | POA: Diagnosis not present

## 2017-08-23 DIAGNOSIS — M961 Postlaminectomy syndrome, not elsewhere classified: Secondary | ICD-10-CM | POA: Diagnosis not present

## 2017-08-23 DIAGNOSIS — Z79899 Other long term (current) drug therapy: Secondary | ICD-10-CM | POA: Diagnosis not present

## 2017-08-23 DIAGNOSIS — G8929 Other chronic pain: Secondary | ICD-10-CM | POA: Diagnosis not present

## 2017-08-23 DIAGNOSIS — M25562 Pain in left knee: Secondary | ICD-10-CM

## 2017-08-23 DIAGNOSIS — G609 Hereditary and idiopathic neuropathy, unspecified: Secondary | ICD-10-CM

## 2017-08-23 MED ORDER — MORPHINE SULFATE ER 15 MG PO TBCR: 15.0000 mg | EXTENDED_RELEASE_TABLET | Freq: Three times a day (TID) | ORAL | 0 refills | Status: DC

## 2017-08-23 NOTE — Progress Notes (Signed)
Subjective:    Patient ID: [name], female    DOB: [birth year], 67 y.o.   MRN: [id number]  HPI: [name] is a 67year old female who returns for follow up appointmentfor chronic pain and medication refill. She states her pain is located in her mid-back.  She rates her pain 6. Her current exercise regime is walking and performing stretching exercises.  [name] is  45.00 MME.  Her last UDS was on 07/31/2017 it was consistent.   Pain Inventory Average Pain 6 Pain Right Now 6 My pain is constant, burning and aching  In the last 24 hours, has pain interfered with the following? General activity 6 Relation with others 6 Enjoyment of life 6 What TIME of day is your pain at its worst? evening, night Sleep (in general) Fair  Pain is worse with: walking, bending, sitting, standing and some activites Pain improves with: rest, heat/ice, therapy/exercise, pacing activities, medication, TENS and injections Relief from Meds: 5  Mobility walk without assistance how many minutes can you walk? 10-15 ability to climb steps?  yes do you drive?  yes Do you have any goals in this area?  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  yes  Neuro/Psych bladder control problems weakness numbness tingling depression  Prior Studies Any changes since last visit?  no  Physicians involved in your care Any changes since last visit?  no   Family History  Problem Relation Age of Onset  . Breast cancer Mother   . Diabetes Mother   . Heart disease Mother   . Allergies Mother   . Diabetes Father   . Heart disease Father   . Emphysema Father   . Allergies Father   . Stomach cancer Maternal Uncle   . Breast cancer Paternal Aunt   . Colon cancer Neg Hx    Social History    Socioeconomic History  . Marital status: Married    Spouse name: None  . Number of children: None  . Years of education: None  . Highest education level: None  Social Needs  . Financial resource strain: None  . Food insecurity - worry: None  . Food insecurity - inability: None  . Transportation needs - medical: None  . Transportation needs - non-medical: None  Occupational History  . Occupation: Radiation protection practitioner  Tobacco Use  . Smoking status: Never Smoker  . Smokeless tobacco: Never Used  Substance and Sexual Activity  . Alcohol use: Yes    Alcohol/week: 0.0 oz    Comment: rare  . Drug use: No  . Sexual activity: None  Other Topics Concern  . None  Social History Narrative  . None   Past Surgical History:  Procedure Laterality Date  . [place] STUDY N/A 01/04/2016   Procedure: [place] STUDY;  Surgeon: Manus Gunning, MD;  Location: [facility];  Service: Gastroenterology;  Laterality: N/A;  . ABDOMINAL HYSTERECTOMY  1988  . ADENOIDECTOMY    . ANTERIOR CERVICAL DECOMP/DISCECTOMY FUSION  2009, 1993   first procedure in ~ 1993/Dr Botero  . APPENDECTOMY  1988  . BACK SURGERY    . CHOLECYSTECTOMY  2005  . ESOPHAGEAL MANOMETRY N/A 01/04/2016   Procedure: ESOPHAGEAL MANOMETRY (EM) 24 HR PH;  Surgeon: Manus Gunning, MD;  Location: [facility];  Service: Gastroenterology;  Laterality: N/A;  . ESOPHAGOGASTRODUODENOSCOPY (EGD) WITH PROPOFOL N/A 05/17/2016   Procedure: ESOPHAGOGASTRODUODENOSCOPY (EGD) WITH PROPOFOL;  Surgeon: Manus Gunning, MD;  Location: [facility];  Service: Gastroenterology;  Laterality: N/A;  . NASAL SINUS SURGERY  2006  . SAVORY DILATION N/A 05/17/2016   Procedure: SAVORY DILATION;  Surgeon: Manus Gunning, MD;  Location: [facility];  Service: Gastroenterology;  Laterality: N/A;  . TONSILLECTOMY AND ADENOIDECTOMY    . TRANSTHORACIC ECHOCARDIOGRAM  2009   Normal LV size and function. EF 60-65%. No regional wall motion  and amount is. Mild aortic sclerosis, no stenosis  . TUBAL LIGATION  1979   Past Medical History:  Diagnosis Date  . Allergy   . Anemia   . Arthritis   . B12 deficiency   . Complication of anesthesia    slow to wake up   . Depression   . Diabetes mellitus without complication ([place])    type II - diet controlled ; A1C 6.5 01/2017  . Fibromyalgia   . Generalized hyperhidrosis 04/17/2014  . GERD (gastroesophageal reflux disease)   . Heart murmur    Aortic Sclerosis  . Hyperlipemia   . HYPOTHYROIDISM 05/29/2007  . Irritable bowel syndrome   . Migraines    hx of   . Oxygen deficiency    2 L at bedtime  . 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 PONV (postoperative nausea and vomiting)   . PVC (premature ventricular contraction)   . Type II or unspecified type diabetes mellitus with neurological manifestations, not stated as uncontrolled(250.60) 04/07/2013  . Unspecified essential hypertension    denies htn on 05/09/16   There were no vitals taken for this visit.  Opioid Risk Score:  1 Fall Risk Score:  `1  Depression screen PHQ 2/9  Depression screen [facility] 2/9 03/27/2017 02/28/2017 01/25/2017 01/02/2017 10/26/2016 07/04/2016 02/03/2016  Decreased Interest 0 1 1 0 1 0 0  Down, Depressed, Hopeless 0 1 1 0 1 0 0  PHQ - 2 Score 0 2 2 0 2 0 0  Altered sleeping - - - - - - -  Tired, decreased energy - - - - - - -  Change in appetite - - - - - - -  Feeling bad or failure about yourself  - - - - - - -  Trouble concentrating - - - - - - -  Moving slowly or fidgety/restless - - - - - - -  Suicidal thoughts - - - - - - -  PHQ-9 Score - - - - - - -  Difficult doing work/chores - - - - - - -  Some recent data might be hidden    Review of Systems  HENT: Negative.   Eyes: Negative.   Respiratory: Negative.   Cardiovascular: Positive for leg swelling.  Gastrointestinal: Positive for constipation.  Endocrine: Negative.   Genitourinary:       Bladder control   Musculoskeletal: Positive for arthralgias, back pain, joint swelling and neck pain.  Skin: Negative.   Allergic/Immunologic: Negative.   Neurological: Positive for weakness and numbness.       Tingling  Hematological: Negative.   Psychiatric/Behavioral: Positive for dysphoric mood.  All other systems reviewed and are negative.      Objective:   Physical Exam  Constitutional: She is oriented to person, place, and time. She appears well-developed and well-nourished.  HENT:  Head: Normocephalic  and atraumatic.  Neck: Normal range of motion. Neck supple.  Cervical Paraspinal Tenderness: C-5-C-6  Cardiovascular: Normal rate and regular rhythm.  Pulmonary/Chest: Effort normal and breath sounds normal.  Musculoskeletal: She exhibits no edema.  Normal Muscle Bulk and Muscle Testing Reveals:  Upper Extremities: Full ROM and Muscle Strength 5 Thoracic Paraspinal Tenderness: T-7-T-9 Lower Extremities: Full ROM and Muscle Strength 5/5 Arises from Table with ease Narrow Based Gait   Neurological: She is alert and oriented to person, place, and time.  Skin: Skin is warm and dry.  Psychiatric: She has a normal mood and affect.  Nursing note and vitals reviewed.         Assessment & Plan:  1. Cervical postlaminectomy syndrome: with neck and shoulder pain. 08/23/2017. Continue exercise routine and using heat therapy. Continue MS Contin 15 mg one tablet three times a day #90.  We will continue the opioid monitoring program, this consists of regular clinic visits, examinations, urine drug screen, pill counts as well as use of [place] Controlled Substance reporting System. 2. Fibromyalgia/ Neuropathy: Continue with Gabapentin, activity and exercise regime. 08/23/2017. 3. Bilateral Knee Pain/ Mild Degenerative Changes:.Continue Voltaren Gel.08/23/2017 4. Right Ankle Pain: No Complaints Today. Continue HEP.08/23/2017 5. Muscle Spasm: Continue Robaxin. 08/23/2017 6. Opioid Induced  Constipation: No complaints Continue to Monitor. 08/23/2017. 7. Midline Low Back Pain: Continue current medication regime, continue HEP as tolerated. 08/23/2017 8. Right Gluteal Medius Weakness and Atrophy: No complaints Today:Continue with Hip Abduction Exercises: Continue to Monitor. 08/23/2017.  20 minutes of face to face patient care time was spent during this visit. All questions were encouraged and answered.   F/U in 1 month

## 2017-08-24 DIAGNOSIS — J9610 Chronic respiratory failure, unspecified whether with hypoxia or hypercapnia: Secondary | ICD-10-CM | POA: Diagnosis not present

## 2017-09-20 ENCOUNTER — Encounter: Payer: Self-pay | Admitting: Registered Nurse

## 2017-09-20 ENCOUNTER — Encounter: Payer: Medicare Other | Attending: Physical Medicine & Rehabilitation | Admitting: Registered Nurse

## 2017-09-20 ENCOUNTER — Other Ambulatory Visit: Payer: Self-pay

## 2017-09-20 VITALS — BP 108/67 | HR 63 | Ht 66.0 in | Wt 229.8 lb

## 2017-09-20 DIAGNOSIS — Z5181 Encounter for therapeutic drug level monitoring: Secondary | ICD-10-CM | POA: Diagnosis not present

## 2017-09-20 DIAGNOSIS — M545 Low back pain, unspecified: Secondary | ICD-10-CM

## 2017-09-20 DIAGNOSIS — M25561 Pain in right knee: Secondary | ICD-10-CM | POA: Diagnosis not present

## 2017-09-20 DIAGNOSIS — M542 Cervicalgia: Secondary | ICD-10-CM | POA: Diagnosis not present

## 2017-09-20 DIAGNOSIS — G8929 Other chronic pain: Secondary | ICD-10-CM

## 2017-09-20 DIAGNOSIS — Z79899 Other long term (current) drug therapy: Secondary | ICD-10-CM | POA: Insufficient documentation

## 2017-09-20 DIAGNOSIS — M25562 Pain in left knee: Secondary | ICD-10-CM | POA: Diagnosis not present

## 2017-09-20 DIAGNOSIS — M961 Postlaminectomy syndrome, not elsewhere classified: Secondary | ICD-10-CM

## 2017-09-20 DIAGNOSIS — Z79891 Long term (current) use of opiate analgesic: Secondary | ICD-10-CM | POA: Diagnosis not present

## 2017-09-20 DIAGNOSIS — M797 Fibromyalgia: Secondary | ICD-10-CM | POA: Diagnosis not present

## 2017-09-20 DIAGNOSIS — M5412 Radiculopathy, cervical region: Secondary | ICD-10-CM

## 2017-09-20 DIAGNOSIS — G894 Chronic pain syndrome: Secondary | ICD-10-CM

## 2017-09-20 DIAGNOSIS — G609 Hereditary and idiopathic neuropathy, unspecified: Secondary | ICD-10-CM | POA: Diagnosis not present

## 2017-09-20 MED ORDER — MORPHINE SULFATE ER 15 MG PO TBCR: 15.0000 mg | EXTENDED_RELEASE_TABLET | Freq: Three times a day (TID) | ORAL | 0 refills | Status: DC

## 2017-09-20 NOTE — Progress Notes (Signed)
Subjective:    Patient ID: [name], female    DOB: August 14, 1950, 67 y.o.   MRN: [id number]  HPI: [name] is a 67year old female who returns for follow up appointmentfor chronic pain and medication refill. She states her pain is located in her neck mainly right side radiating into her right shoulder, bilateral knee pain with activity and lid back pain. She rates her pain 6. Her current exercise regime is walking and performing stretching exercises with bands.   [name] Morphine equivalent is  49.50 MME.  Her last UDS was on 07/31/2017 it was consistent.   Pain Inventory Average Pain 6 Pain Right Now 6 My pain is constant, burning and aching  In the last 24 hours, has pain interfered with the following? General activity 7 Relation with others 7 Enjoyment of life 6 What TIME of day is your pain at its worst? evening, night Sleep (in general) Fair  Pain is worse with: walking, bending, sitting, standing and some activites Pain improves with: rest, heat/ice, therapy/exercise, pacing activities, medication, TENS and injections Relief from Meds: 5  Mobility walk without assistance how many minutes can you walk? 10-15 ability to climb steps?  yes do you drive?  yes Do you have any goals in this area?  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  yes  Neuro/Psych bladder control problems weakness numbness tingling depression  Prior Studies Any changes since last visit?  no  Physicians involved in your care Any changes since last visit?  no   Family History  Problem Relation Age of Onset  . Breast cancer Mother   . Diabetes Mother   . Heart disease Mother   . Allergies Mother   . Diabetes Father   . Heart disease Father   . Emphysema Father   . Allergies Father   .  Stomach cancer Maternal Uncle   . Breast cancer Paternal Aunt   . Colon cancer Neg Hx    Social History   Socioeconomic History  . Marital status: Married    Spouse name: Not on file  . Number of children: Not on file  . Years of education: Not on file  . Highest education level: Not on file  Occupational History  . Occupation: Medical laboratory scientific officer  . Financial resource strain: Not on file  . Food insecurity:    Worry: Not on file    Inability: Not on file  . Transportation needs:    Medical: Not on file    Non-medical: Not on file  Tobacco Use  . Smoking status: Never Smoker  . Smokeless tobacco: Never Used  Substance and Sexual Activity  . Alcohol use: Yes    Alcohol/week: 0.0 oz    Comment: rare  . Drug use: No  . Sexual activity: Not on file  Lifestyle  . Physical activity:    Days per week: Not on file    Minutes per session: Not on file  . Stress: Not on file  Relationships  . Social connections:    Talks on phone: Not on file    Gets together: Not on file    Attends religious service: Not on file    Active member of club or organization: Not on file    Attends meetings of clubs or  organizations: Not on file    Relationship status: Not on file  Other Topics Concern  . Not on file  Social History Narrative  . Not on file   Past Surgical History:  Procedure Laterality Date  . [place] STUDY N/A 01/04/2016   Procedure: [place] STUDY;  Surgeon: Manus Gunning, MD;  Location: [facility];  Service: Gastroenterology;  Laterality: N/A;  . ABDOMINAL HYSTERECTOMY  1988  . ADENOIDECTOMY    . ANTERIOR CERVICAL DECOMP/DISCECTOMY FUSION  2009, 1993   first procedure in ~ 1993/Dr Botero  . APPENDECTOMY  1988  . BACK SURGERY    . CHOLECYSTECTOMY  2005  . ESOPHAGEAL MANOMETRY N/A 01/04/2016   Procedure: ESOPHAGEAL MANOMETRY (EM) 24 HR PH;  Surgeon: Manus Gunning, MD;  Location: [facility];  Service: Gastroenterology;  Laterality: N/A;  .  ESOPHAGOGASTRODUODENOSCOPY (EGD) WITH PROPOFOL N/A 05/17/2016   Procedure: ESOPHAGOGASTRODUODENOSCOPY (EGD) WITH PROPOFOL;  Surgeon: Manus Gunning, MD;  Location: [facility];  Service: Gastroenterology;  Laterality: N/A;  . NASAL SINUS SURGERY  2006  . SAVORY DILATION N/A 05/17/2016   Procedure: SAVORY DILATION;  Surgeon: Manus Gunning, MD;  Location: [facility];  Service: Gastroenterology;  Laterality: N/A;  . TONSILLECTOMY AND ADENOIDECTOMY    . TRANSTHORACIC ECHOCARDIOGRAM  2009   Normal LV size and function. EF 60-65%. No regional wall motion and amount is. Mild aortic sclerosis, no stenosis  . TUBAL LIGATION  1979   Past Medical History:  Diagnosis Date  . Allergy   . Anemia   . Arthritis   . B12 deficiency   . Complication of anesthesia    slow to wake up   . Depression   . Diabetes mellitus without complication ([place])    type II - diet controlled ; A1C 6.5 01/2017  . Fibromyalgia   . Generalized hyperhidrosis 04/17/2014  . GERD (gastroesophageal reflux disease)   . Heart murmur    Aortic Sclerosis  . Hyperlipemia   . HYPOTHYROIDISM 05/29/2007  . Irritable bowel syndrome   . Migraines    hx of   . Oxygen deficiency    2 L at bedtime  . 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 PONV (postoperative nausea and vomiting)   . PVC (premature ventricular contraction)   . Type II or unspecified type diabetes mellitus with neurological manifestations, not stated as uncontrolled(250.60) 04/07/2013  . Unspecified essential hypertension    denies htn on 05/09/16   BP 108/67   Pulse 63   Ht 5\' 6"  (1.676 m) Comment: pt reported  Wt 229 lb 12.8 oz (104.2 kg)   SpO2 97%   BMI 37.09 kg/m   Opioid Risk Score:  1 Fall Risk Score:  `1  Depression screen PHQ 2/9  Depression screen [facility] 2/9 03/27/2017 02/28/2017 01/25/2017 01/02/2017 10/26/2016 07/04/2016 02/03/2016  Decreased Interest 0 1 1 0 1 0 0  Down, Depressed, Hopeless 0 1 1 0 1 0 0  PHQ -  2 Score 0 2 2 0 2 0 0  Altered sleeping - - - - - - -  Tired, decreased energy - - - - - - -  Change in appetite - - - - - - -  Feeling bad or failure about yourself  - - - - - - -  Trouble concentrating - - - - - - -  Moving slowly or fidgety/restless - - - - - - -  Suicidal thoughts - - - - - - -  PHQ-9 Score - - - - - - -  Difficult doing work/chores - - - - - - -  Some recent data might be hidden    Review of Systems  Constitutional: Negative.   HENT: Negative.   Eyes: Negative.   Respiratory: Negative.   Cardiovascular: Positive for leg swelling.  Gastrointestinal: Positive for constipation.  Endocrine: Negative.   Genitourinary: Negative.        Bladder control  Musculoskeletal: Positive for arthralgias, back pain, joint swelling and neck pain.  Skin: Negative.   Allergic/Immunologic: Negative.   Neurological: Positive for weakness and numbness.       Tingling  Hematological: Negative.   Psychiatric/Behavioral: Positive for dysphoric mood.  All other systems reviewed and are negative.      Objective:   Physical Exam  Constitutional: She is oriented to person, place, and time. She appears well-developed and well-nourished.  HENT:  Head: Normocephalic and atraumatic.  Neck: Normal range of motion. Neck supple.  Cervical Paraspinal Tenderness: C-5-C-6  Cardiovascular: Normal rate and regular rhythm.  Pulmonary/Chest: Effort normal and breath sounds normal. No respiratory distress.  Musculoskeletal: She exhibits no edema.  Normal Muscle Bulk and Muscle Testing Reveals:  Upper Extremities: Full ROM and Muscle Strength 5/5 Thoracic Paraspinal Tenderness: T-1-T-3 T-7-T-9 Mainly Right Side Lower Extremities: Full ROM and Muscle Strength 5/5 Arises from Table with ease Narrow Based Gait   Neurological: She is alert and oriented to person, place, and time.  Skin: Skin is warm and dry.  Psychiatric: She has a normal mood and affect.  Nursing note and vitals  reviewed.         Assessment & Plan:  1. Cervical postlaminectomy syndrome: with neck and shoulder pain. 09/20/2017. Continue exercise routine and using heat therapy. Continue MS Contin 15 mg one tablet three times a day #90.  We will continue the opioid monitoring program, this consists of regular clinic visits, examinations, urine drug screen, pill counts as well as use of [place] Controlled Substance reporting System. 2. Fibromyalgia/ Neuropathy: Continue with Gabapentin, activity and exercise regime. 09/20/2017. 3. Bilateral Knee Pain/ Mild Degenerative Changes:.Continue Voltaren Gel.09/20/2017 4. Right Ankle Pain: No Complaints Today. Continue HEP.09/20/2017 5. Muscle Spasm: Continue Robaxin. 09/20/2017 6. Opioid Induced Constipation: No complaints Continue to Monitor. 09/20/2017. 7. Midline Low Back Pain: Continue current medication regime, continue HEP as tolerated. 32/27/2019 8. Right Gluteal Medius Weakness and Atrophy: No complaints Today:Continue with Hip Abduction Exercises: Continue to Monitor. 09/20/2017.  20 minutes of face to face patient care time was spent during this visit. All questions were encouraged and answered.   F/U in 1 month

## 2017-09-21 DIAGNOSIS — J9610 Chronic respiratory failure, unspecified whether with hypoxia or hypercapnia: Secondary | ICD-10-CM | POA: Diagnosis not present

## 2017-10-18 ENCOUNTER — Encounter: Payer: Self-pay | Admitting: Registered Nurse

## 2017-10-18 ENCOUNTER — Encounter: Payer: Medicare Other | Attending: Physical Medicine & Rehabilitation | Admitting: Registered Nurse

## 2017-10-18 VITALS — BP 131/78 | HR 61 | Ht 66.0 in | Wt 227.0 lb

## 2017-10-18 DIAGNOSIS — Z79899 Other long term (current) drug therapy: Secondary | ICD-10-CM | POA: Diagnosis not present

## 2017-10-18 DIAGNOSIS — M797 Fibromyalgia: Secondary | ICD-10-CM | POA: Diagnosis not present

## 2017-10-18 DIAGNOSIS — Z79891 Long term (current) use of opiate analgesic: Secondary | ICD-10-CM

## 2017-10-18 DIAGNOSIS — M25561 Pain in right knee: Secondary | ICD-10-CM | POA: Diagnosis not present

## 2017-10-18 DIAGNOSIS — M961 Postlaminectomy syndrome, not elsewhere classified: Secondary | ICD-10-CM

## 2017-10-18 DIAGNOSIS — M545 Low back pain, unspecified: Secondary | ICD-10-CM

## 2017-10-18 DIAGNOSIS — G609 Hereditary and idiopathic neuropathy, unspecified: Secondary | ICD-10-CM

## 2017-10-18 DIAGNOSIS — Z5181 Encounter for therapeutic drug level monitoring: Secondary | ICD-10-CM | POA: Diagnosis not present

## 2017-10-18 DIAGNOSIS — M25562 Pain in left knee: Secondary | ICD-10-CM

## 2017-10-18 DIAGNOSIS — G8929 Other chronic pain: Secondary | ICD-10-CM

## 2017-10-18 DIAGNOSIS — G894 Chronic pain syndrome: Secondary | ICD-10-CM

## 2017-10-18 MED ORDER — GABAPENTIN 300 MG PO CAPS: 300.0000 mg | ORAL_CAPSULE | Freq: Three times a day (TID) | ORAL | 2 refills | Status: DC

## 2017-10-18 MED ORDER — MORPHINE SULFATE ER 15 MG PO TBCR: 15.0000 mg | EXTENDED_RELEASE_TABLET | Freq: Three times a day (TID) | ORAL | 0 refills | Status: DC

## 2017-10-18 MED ORDER — METHOCARBAMOL 500 MG PO TABS: ORAL_TABLET | ORAL | 3 refills | Status: DC

## 2017-10-18 NOTE — Progress Notes (Signed)
Subjective:    Patient ID: [name], female    DOB: 19-Dec-1950, 67 y.o.   MRN: [id number]  HPI: [name] is a 67 year old female who returns for follow up appointment for chronic pain and medication refill. She states her pain is located in her mid-back and bilateral knee pain. She rates her pain 4. Her current exercise regime is walking.   Ms. [place] Morphine Equivalent is 48.00 MME.   Last UDS was Performed on 07/31/2017 it was consistent.    Pain Inventory Average Pain 5 Pain Right Now 4 My pain is constant, burning and aching  In the last 24 hours, has pain interfered with the following? General activity 7 Relation with others 7 Enjoyment of life 7 What TIME of day is your pain at its worst? evening, night  Sleep (in general) Fair  Pain is worse with: walking, bending, sitting, standing, unsure and some activites Pain improves with: rest, heat/ice, therapy/exercise, pacing activities, medication, TENS and injections Relief from Meds: 5  Mobility walk without assistance how many minutes can you walk? 15 ability to climb steps?  yes do you drive?  yes Do you have any goals in this area?  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 I need assistance with the following:  meal prep, household duties and shopping Do you have any goals in this area?  yes  Neuro/Psych bladder control problems numbness tremor tingling spasms depression  Prior Studies Any changes since last visit?  no  Physicians involved in your care Any changes since last visit?  no   Family History  Problem Relation Age of Onset  . Breast cancer Mother   . Diabetes Mother   . Heart disease Mother   . Allergies Mother   . Diabetes Father   . Heart disease Father   . Emphysema Father   . Allergies Father   . Stomach cancer Maternal Uncle   . Breast cancer Paternal Aunt   . Colon cancer Neg Hx    Social History    Socioeconomic History  . Marital status: Married    Spouse name: Not on file  . Number of children: Not on file  . Years of education: Not on file  . Highest education level: Not on file  Occupational History  . Occupation: Medical laboratory scientific officer  . Financial resource strain: Not on file  . Food insecurity:    Worry: Not on file    Inability: Not on file  . Transportation needs:    Medical: Not on file    Non-medical: Not on file  Tobacco Use  . Smoking status: Never Smoker  . Smokeless tobacco: Never Used  Substance and Sexual Activity  . Alcohol use: Yes    Alcohol/week: 0.0 oz    Comment: rare  . Drug use: No  . Sexual activity: Not on file  Lifestyle  . Physical activity:    Days per week: Not on file    Minutes per session: Not on file  . Stress: Not on file  Relationships  . Social connections:    Talks on phone: Not on file    Gets together: Not on file    Attends religious service: Not on file    Active member of club or organization: Not on file    Attends meetings of clubs or organizations: Not on file    Relationship status: Not on file  Other Topics Concern  . Not on file  Social History Narrative  . Not on file   Past Surgical History:  Procedure Laterality Date  . [place] STUDY N/A 01/04/2016   Procedure: [place] STUDY;  Surgeon: Manus Gunning, MD;  Location: [facility];  Service: Gastroenterology;  Laterality: N/A;  . ABDOMINAL HYSTERECTOMY  1988  . ADENOIDECTOMY    . ANTERIOR CERVICAL DECOMP/DISCECTOMY FUSION  2009, 1993   first procedure in ~ 1993/Dr Botero  . APPENDECTOMY  1988  . BACK SURGERY    . CHOLECYSTECTOMY  2005  . ESOPHAGEAL MANOMETRY N/A 01/04/2016   Procedure: ESOPHAGEAL MANOMETRY (EM) 24 HR PH;  Surgeon: Manus Gunning, MD;  Location: [facility];  Service: Gastroenterology;  Laterality: N/A;  . ESOPHAGOGASTRODUODENOSCOPY (EGD) WITH PROPOFOL N/A 05/17/2016   Procedure: ESOPHAGOGASTRODUODENOSCOPY (EGD)  WITH PROPOFOL;  Surgeon: Manus Gunning, MD;  Location: [facility];  Service: Gastroenterology;  Laterality: N/A;  . NASAL SINUS SURGERY  2006  . SAVORY DILATION N/A 05/17/2016   Procedure: SAVORY DILATION;  Surgeon: Manus Gunning, MD;  Location: [facility];  Service: Gastroenterology;  Laterality: N/A;  . TONSILLECTOMY AND ADENOIDECTOMY    . TRANSTHORACIC ECHOCARDIOGRAM  2009   Normal LV size and function. EF 60-65%. No regional wall motion and amount is. Mild aortic sclerosis, no stenosis  . TUBAL LIGATION  1979   Past Medical History:  Diagnosis Date  . Allergy   . Anemia   . Arthritis   . B12 deficiency   . Complication of anesthesia    slow to wake up   . Depression   . Diabetes mellitus without complication ([place])    type II - diet controlled ; A1C 6.5 01/2017  . Fibromyalgia   . Generalized hyperhidrosis 04/17/2014  . GERD (gastroesophageal reflux disease)   . Heart murmur    Aortic Sclerosis  . Hyperlipemia   . HYPOTHYROIDISM 05/29/2007  . Irritable bowel syndrome   . Migraines    hx of   . Oxygen deficiency    2 L at bedtime  . 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 PONV (postoperative nausea and vomiting)   . PVC (premature ventricular contraction)   . Type II or unspecified type diabetes mellitus with neurological manifestations, not stated as uncontrolled(250.60) 04/07/2013  . Unspecified essential hypertension    denies htn on 05/09/16   BP 131/78   Pulse 61   Ht 5\' 6"  (1.676 m)   Wt 227 lb (103 kg)   SpO2 96%   BMI 36.64 kg/m   Opioid Risk Score:   Fall Risk Score:  `1  Depression screen PHQ 2/9  Depression screen [facility] 2/9 03/27/2017 02/28/2017 01/25/2017 01/02/2017 10/26/2016 07/04/2016 02/03/2016  Decreased Interest 0 1 1 0 1 0 0  Down, Depressed, Hopeless 0 1 1 0 1 0 0  PHQ - 2 Score 0 2 2 0 2 0 0  Altered sleeping - - - - - - -  Tired, decreased energy - - - - - - -  Change in appetite - - - - - - -  Feeling  bad or failure about yourself  - - - - - - -  Trouble concentrating - - - - - - -  Moving slowly or fidgety/restless - - - - - - -  Suicidal thoughts - - - - - - -  PHQ-9 Score - - - - - - -  Difficult doing work/chores - - - - - - -  Some recent data might be hidden    Review of Systems  Constitutional: Negative.   HENT: Negative.   Eyes: Negative.   Respiratory: Negative.   Cardiovascular: Negative.   Gastrointestinal: Negative.   Endocrine: Negative.   Genitourinary: Positive for difficulty urinating.  Musculoskeletal: Positive for arthralgias, back pain and neck pain.       Spasms  Skin: Negative.   Allergic/Immunologic: Negative.   Neurological: Positive for tremors and numbness.       Tingling  Hematological: Negative.   Psychiatric/Behavioral: Positive for dysphoric mood.       Objective:   Physical Exam  Constitutional: She is oriented to person, place, and time. She appears well-developed and well-nourished.  HENT:  Head: Normocephalic and atraumatic.  Neck: Normal range of motion. Neck supple.  Cardiovascular: Normal rate and regular rhythm.  Pulmonary/Chest: Effort normal and breath sounds normal.  Musculoskeletal:  Normal Muscle Bulk and Muscle Testing Reveals: Upper Extremities: Full ROM and Muscle Strength 5/5 Thoracic Paraspinal Tenderness: T-7-T-9 Lumbar Paraspinal Tenderness: L-3-L-5 Lower Extremities: Full ROM and Muscle Strength 5/5 Arises from Table with ease Narrow Based Gait   Neurological: She is alert and oriented to person, place, and time.  Skin: Skin is warm and dry.  Psychiatric: She has a normal mood and affect.  Nursing note and vitals reviewed.         Assessment & Plan:  1. Cervical postlaminectomy syndrome: with neck and shoulder pain. 10/18/2017. Continue exercise routine and using heat therapy. Continue current medication regimen with MS Contin 15 mg one tablet three times a day #90.  We will continue the opioid  monitoring program, this consists of regular clinic visits, examinations, urine drug screen, pill counts as well as use of [place] Controlled Substance reporting System. 2. Fibromyalgia/ Neuropathy: Continue current medication regimen with Gabapentin, activity and exercise regime. 10/18/2017. 3. Bilateral Knee Pain/ Mild Degenerative Changes:.Continue current medication regimen with Voltaren Gel.10/18/2017 4. Right Ankle Pain: No Complaints Today. Continue HEP.10/18/2017 5. Muscle Spasm: Continue current medication regimen with  Robaxin. 10/18/2017 6. Opioid Induced Constipation: No complaints Continue to Monitor. 10/18/2017. 7. Midline Low Back Pain: Continue current medication regime, continue HEP as tolerated. 10/18/2017 8. Right Gluteal Medius Weakness and Atrophy: No complaints Today:Continue with Hip Abduction Exercises: Continue to Monitor. 10/18/2017.  20 minutes of face to face patient care time was spent during this visit. All questions were encouraged and answered.   F/U in 1 month

## 2017-10-22 DIAGNOSIS — J9610 Chronic respiratory failure, unspecified whether with hypoxia or hypercapnia: Secondary | ICD-10-CM | POA: Diagnosis not present

## 2017-11-20 ENCOUNTER — Encounter: Payer: Medicare Other | Attending: Physical Medicine & Rehabilitation

## 2017-11-20 DIAGNOSIS — Z79899 Other long term (current) drug therapy: Secondary | ICD-10-CM | POA: Insufficient documentation

## 2017-11-20 DIAGNOSIS — G894 Chronic pain syndrome: Secondary | ICD-10-CM | POA: Insufficient documentation

## 2017-11-20 DIAGNOSIS — Z5181 Encounter for therapeutic drug level monitoring: Secondary | ICD-10-CM | POA: Insufficient documentation

## 2017-11-21 DIAGNOSIS — J9610 Chronic respiratory failure, unspecified whether with hypoxia or hypercapnia: Secondary | ICD-10-CM | POA: Diagnosis not present

## 2017-11-24 ENCOUNTER — Other Ambulatory Visit: Payer: Self-pay

## 2017-11-24 ENCOUNTER — Ambulatory Visit: Payer: Medicare Other | Admitting: Physical Medicine & Rehabilitation

## 2017-11-24 ENCOUNTER — Encounter: Payer: Self-pay | Admitting: Physical Medicine & Rehabilitation

## 2017-11-24 VITALS — BP 116/74 | HR 65 | Ht 66.5 in | Wt 226.4 lb

## 2017-11-24 DIAGNOSIS — M961 Postlaminectomy syndrome, not elsewhere classified: Secondary | ICD-10-CM

## 2017-11-24 DIAGNOSIS — Z5181 Encounter for therapeutic drug level monitoring: Secondary | ICD-10-CM | POA: Diagnosis not present

## 2017-11-24 DIAGNOSIS — M546 Pain in thoracic spine: Secondary | ICD-10-CM

## 2017-11-24 DIAGNOSIS — Z79899 Other long term (current) drug therapy: Secondary | ICD-10-CM | POA: Diagnosis not present

## 2017-11-24 DIAGNOSIS — G894 Chronic pain syndrome: Secondary | ICD-10-CM | POA: Diagnosis not present

## 2017-11-24 DIAGNOSIS — G8929 Other chronic pain: Secondary | ICD-10-CM | POA: Diagnosis not present

## 2017-11-24 MED ORDER — MORPHINE SULFATE ER 15 MG PO TBCR: 15.0000 mg | EXTENDED_RELEASE_TABLET | Freq: Three times a day (TID) | ORAL | 0 refills | Status: DC

## 2017-11-24 NOTE — Progress Notes (Addendum)
Subjective:    Patient ID: [name], female    DOB: [birth year], 67 y.o.   MRN: [id number]  HPI 67 yo female with Chronic mid back pain due to T11-12 HNP Stretching with pull up bar Doing rows with theraband Neck pain is more on Right side   On chronic morphine for pain with good relief (30% relief)  Has appt with DPM for bunion Pain Inventory Average Pain 5 Pain Right Now 4 My pain is constant, burning and aching  In the last 24 hours, has pain interfered with the following? General activity 6 Relation with others 6 Enjoyment of life 6 What TIME of day is your pain at its worst? evening night Sleep (in general) Fair  Pain is worse with: walking, bending, sitting, standing and some activites Pain improves with: rest, heat/ice, therapy/exercise, pacing activities, medication, TENS and injections Relief from Meds: 4  Mobility walk without assistance how many minutes can you walk? 10-15 ability to climb steps?  yes do you drive?  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  no  Physicians involved in your care Any changes since last visit?  no   Family History  Problem Relation Age of Onset  . Breast cancer Mother   . Diabetes Mother   . Heart disease Mother   . Allergies Mother   . Diabetes Father   . Heart disease Father   . Emphysema Father   . Allergies Father   . Stomach cancer Maternal Uncle   . Breast cancer Paternal Aunt   . Colon cancer Neg Hx    Social History   Socioeconomic History  . Marital status: Married    Spouse name: Not on file  . Number of children: Not on file  . Years of education: Not on file  . Highest education level: Not on file  Occupational History    . Occupation: Medical laboratory scientific officer  . Financial resource strain: Not on file  . Food insecurity:    Worry: Not on file    Inability: Not on file  . Transportation needs:    Medical: Not on file    Non-medical: Not on file  Tobacco Use  . Smoking status: Never Smoker  . Smokeless tobacco: Never Used  Substance and Sexual Activity  . Alcohol use: Yes    Alcohol/week: 0.0 oz    Comment: rare  . Drug use: No  . Sexual activity: Not on file  Lifestyle  . Physical activity:    Days per week: Not on file    Minutes per session: Not on file  . Stress: Not on file  Relationships  . Social connections:    Talks on phone: Not on file    Gets together: Not on file    Attends religious service: Not on file    Active member of club or organization: Not on file    Attends meetings of clubs or organizations: Not on file    Relationship status: Not on file  Other Topics Concern  . Not on file  Social History Narrative  . Not on file   Past Surgical History:  Procedure Laterality Date  . [place] STUDY N/A 01/04/2016   Procedure: [place] STUDY;  Surgeon:  Manus Gunning, MD;  Location: [facility];  Service: Gastroenterology;  Laterality: N/A;  . ABDOMINAL HYSTERECTOMY  1988  . ADENOIDECTOMY    . ANTERIOR CERVICAL DECOMP/DISCECTOMY FUSION  2009, 1993   first procedure in ~ 1993/Dr Botero  . APPENDECTOMY  1988  . BACK SURGERY    . CHOLECYSTECTOMY  2005  . ESOPHAGEAL MANOMETRY N/A 01/04/2016   Procedure: ESOPHAGEAL MANOMETRY (EM) 24 HR PH;  Surgeon: Manus Gunning, MD;  Location: [facility];  Service: Gastroenterology;  Laterality: N/A;  . ESOPHAGOGASTRODUODENOSCOPY (EGD) WITH PROPOFOL N/A 05/17/2016   Procedure: ESOPHAGOGASTRODUODENOSCOPY (EGD) WITH PROPOFOL;  Surgeon: Manus Gunning, MD;  Location: [facility];  Service: Gastroenterology;  Laterality: N/A;  . NASAL SINUS SURGERY  2006  . SAVORY DILATION N/A 05/17/2016   Procedure: SAVORY  DILATION;  Surgeon: Manus Gunning, MD;  Location: [facility];  Service: Gastroenterology;  Laterality: N/A;  . TONSILLECTOMY AND ADENOIDECTOMY    . TRANSTHORACIC ECHOCARDIOGRAM  2009   Normal LV size and function. EF 60-65%. No regional wall motion and amount is. Mild aortic sclerosis, no stenosis  . TUBAL LIGATION  1979   Past Medical History:  Diagnosis Date  . Allergy   . Anemia   . Arthritis   . B12 deficiency   . Complication of anesthesia    slow to wake up   . Depression   . Diabetes mellitus without complication ([place])    type II - diet controlled ; A1C 6.5 01/2017  . Fibromyalgia   . Generalized hyperhidrosis 04/17/2014  . GERD (gastroesophageal reflux disease)   . Heart murmur    Aortic Sclerosis  . Hyperlipemia   . HYPOTHYROIDISM 05/29/2007  . Irritable bowel syndrome   . Migraines    hx of   . Oxygen deficiency    2 L at bedtime  . 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 PONV (postoperative nausea and vomiting)   . PVC (premature ventricular contraction)   . Type II or unspecified type diabetes mellitus with neurological manifestations, not stated as uncontrolled(250.60) 04/07/2013  . Unspecified essential hypertension    denies htn on 05/09/16   BP 116/74   Pulse 65   Ht 5' 6.5" (1.689 m) Comment: pt reported  Wt 226 lb 6.4 oz (102.7 kg)   SpO2 97%   BMI 35.99 kg/m   Opioid Risk Score:   Fall Risk Score:  `1  Depression screen PHQ 2/9  Depression screen [facility] 2/9 11/24/2017 03/27/2017 02/28/2017 01/25/2017 01/02/2017 10/26/2016 07/04/2016  Decreased Interest 1 0 1 1 0 1 0  Down, Depressed, Hopeless 1 0 1 1 0 1 0  PHQ - 2 Score 2 0 2 2 0 2 0  Altered sleeping - - - - - - -  Tired, decreased energy - - - - - - -  Change in appetite - - - - - - -  Feeling bad or failure about yourself  - - - - - - -  Trouble concentrating - - - - - - -  Moving slowly or fidgety/restless - - - - - - -  Suicidal thoughts - - - - - - -  PHQ-9  Score - - - - - - -  Difficult doing work/chores Not difficult at all - - - - - -  Some recent data might be hidden    Review of Systems  Constitutional: Positive for fever.  HENT: Negative.  Eyes: Negative.   Respiratory: Negative.   Cardiovascular: Negative.   Gastrointestinal: Positive for constipation.  Endocrine: Negative.   Genitourinary: Negative.   Musculoskeletal: Negative.   Skin: Negative.   Allergic/Immunologic: Negative.   Neurological: Negative.   Hematological: Negative.   Psychiatric/Behavioral: Negative.   All other systems reviewed and are negative.      Objective:   Physical Exam  Constitutional: She is oriented to person, place, and time. She appears well-developed and well-nourished. No distress.  HENT:  Head: Normocephalic and atraumatic.  Neurological: She is alert and oriented to person, place, and time.  Motor strength is 5/5 bilateral deltoid, bicep, tricep, grip, hip flexor, knee extensor, ankle dorsiflexor Gait without evidence of toe drag or knee instability no assistive device.  Skin: Skin is warm and dry. She is not diaphoretic.  Psychiatric: She has a normal mood and affect. Her behavior is normal. Judgment and thought content normal.  Nursing note and vitals reviewed.   Cervical ROM 75% f/ex/ext, 50% Lateral bending to R and 75% Lateral bending to Left   lumbar ROM 50% flex/ext lateral bending    No tenderness to palpation in thoracolumbar spine, Mild trap tendernes on R  Assessment & Plan:  1.  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  Continue exercise program 2.  Cervical post laminectomy syndrome, the right trapezius pain is likely referral pain from cervical facet C7-T1.  Has not responded to trigger point injections in the past. #3.  Chronic multifactorial pain syndrome has been well controlled with MS Contin 15 mg 3 times daily She is scheduled for bunionectomy.  I recommended that she  continue her MS Contin 15 mg 3 times daily postoperatively and her podiatrist may add what ever usual short acting pain medication on top of her chronic opioids.  Would recommend hydrocodone rather than oxycodone given her intolerance to oxycodone  PMP aware website reviewed Physical medicine rehab nurse practitioner visit in 1 month

## 2017-11-24 NOTE — Patient Instructions (Addendum)
Post op pain management  Cont current pain medications:  Morphine Extended release 15mg  3 times daily  Plus  Whatever usual post op medication is prescribed by Dr Scherrie November (would use hydrocodone rather than oxycodone due to nausea and diarrhea hx with Oxy)

## 2017-11-29 ENCOUNTER — Encounter: Payer: Self-pay | Admitting: Podiatry

## 2017-11-29 ENCOUNTER — Ambulatory Visit: Payer: Medicare Other | Admitting: Podiatry

## 2017-11-29 ENCOUNTER — Ambulatory Visit (INDEPENDENT_AMBULATORY_CARE_PROVIDER_SITE_OTHER): Payer: Medicare Other

## 2017-11-29 DIAGNOSIS — M2011 Hallux valgus (acquired), right foot: Secondary | ICD-10-CM | POA: Diagnosis not present

## 2017-11-29 DIAGNOSIS — M2012 Hallux valgus (acquired), left foot: Secondary | ICD-10-CM | POA: Diagnosis not present

## 2017-11-29 NOTE — Progress Notes (Signed)
Subjective:  Patient ID: [name], female    DOB: [birth year],  MRN: [id number] HPI Chief Complaint  Patient presents with  . Bunions    Patient presents today for bilat bunions that are becoming more painful.  She reports left is worse than right.  She states she has had multiple steroid injection in the past and has been dealing with this for many years.  She is here to discuss surgery    67 y.o. female presents with the above complaint.   ROS: She denies fever chills nausea vomiting muscle aches pains calf pain back pain chest pain shortness of breath.  Past Medical History:  Diagnosis Date  . Allergy   . Anemia   . Arthritis   . B12 deficiency   . Complication of anesthesia    slow to wake up   . Depression   . Diabetes mellitus without complication ([place])    type II - diet controlled ; A1C 6.5 01/2017  . Fibromyalgia   . Generalized hyperhidrosis 04/17/2014  . GERD (gastroesophageal reflux disease)   . Heart murmur    Aortic Sclerosis  . Hyperlipemia   . HYPOTHYROIDISM 05/29/2007  . Irritable bowel syndrome   . Migraines    hx of   . Oxygen deficiency    2 L at bedtime  . 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 PONV (postoperative nausea and vomiting)   . PVC (premature ventricular contraction)   . Type II or unspecified type diabetes mellitus with neurological manifestations, not stated as uncontrolled(250.60) 04/07/2013  . Unspecified essential hypertension    denies htn on 05/09/16   Past Surgical History:  Procedure Laterality Date  . [place] STUDY N/A 01/04/2016   Procedure: [place] STUDY;  Surgeon: Manus Gunning, MD;  Location: [facility];  Service: Gastroenterology;  Laterality: N/A;  . ABDOMINAL HYSTERECTOMY  1988  . ADENOIDECTOMY    . ANTERIOR CERVICAL DECOMP/DISCECTOMY FUSION  2009, 1993   first procedure in ~ 1993/Dr Botero  . APPENDECTOMY  1988  . BACK SURGERY    . CHOLECYSTECTOMY  2005  . ESOPHAGEAL  MANOMETRY N/A 01/04/2016   Procedure: ESOPHAGEAL MANOMETRY (EM) 24 HR PH;  Surgeon: Manus Gunning, MD;  [facility];  Service: Gastroenterology;  Laterality: N/A;  . ESOPHAGOGASTRODUODENOSCOPY (EGD) WITH PROPOFOL N/A 05/17/2016   Procedure: ESOPHAGOGASTRODUODENOSCOPY (EGD) WITH PROPOFOL;  Surgeon: Manus Gunning, MD;  [facility];  Service: Gastroenterology;  Laterality: N/A;  . NASAL SINUS SURGERY  2006  . SAVORY DILATION N/A 05/17/2016   Procedure: SAVORY DILATION;  Surgeon: Manus Gunning, MD;  Location: [facility];  Service: Gastroenterology;  Laterality: N/A;  . TONSILLECTOMY AND ADENOIDECTOMY    . TRANSTHORACIC ECHOCARDIOGRAM  2009   Normal LV size and function. EF 60-65%. No regional wall motion and amount is. Mild aortic sclerosis, no stenosis  . TUBAL LIGATION  1979    Current Outpatient Medications:  .  B Complex Vitamins (B COMPLEX 100 PO), Take 1 tablet by mouth daily., Disp: , Rfl:  .  benzonatate (TESSALON PERLES) 100 MG capsule, Take 1-2 capsules (100-200 mg total) by mouth every 8 (eight) hours as needed for cough. (Patient not taking: Reported on 11/24/2017), Disp: 30 capsule, Rfl: 0 .  bisacodyl (DULCOLAX) 5 MG EC tablet, Take 5 mg by mouth daily as needed for moderate constipation. Take 1-2 at hs, Disp: , Rfl:  .  Calcium  Carbonate (CALCIUM 600 PO), Take 600 mg by mouth 2 (two) times daily. , Disp: , Rfl:  .  carboxymethylcellulose (REFRESH PLUS) 0.5 % SOLN, Place 1 drop into both eyes 3 (three) times daily as needed. , Disp: , Rfl:  .  Cholecalciferol (VITAMIN D) 2000 UNITS tablet, Take 2,000 Units by mouth daily.  , Disp: , Rfl:  .  citalopram (CELEXA) 40 MG tablet, TAKE 1 TABLET BY MOUTH  DAILY (Patient not taking: Reported on 11/24/2017), Disp: 90 tablet, Rfl: 2 .  Coenzyme Q-10 100 MG capsule, Take 100 mg by mouth daily., Disp: , Rfl:  .  diclofenac sodium (VOLTAREN) 1 % GEL, Apply 4 g topically 4 (four) times daily as  needed., Disp: , Rfl:  .  docusate sodium (COLACE) 100 MG capsule, Take 400 mg by mouth at bedtime. , Disp: , Rfl:  .  gabapentin (NEURONTIN) 300 MG capsule, Take 1 capsule (300 mg total) by mouth 3 (three) times daily. (Patient not taking: Reported on 11/24/2017), Disp: 270 capsule, Rfl: 2 .  GRAPE SEED EXTRACT PO, Take by mouth. 1 tablet daily, Disp: , Rfl:  .  levothyroxine (SYNTHROID, LEVOTHROID) 50 MCG tablet, TAKE 1 TABLET BY MOUTH  DAILY (Patient not taking: Reported on 11/24/2017), Disp: 90 tablet, Rfl: 2 .  loratadine (CLARITIN) 10 MG tablet, Take 10 mg by mouth daily., Disp: , Rfl:  .  MAGNESIUM GLYCINATE PLUS PO, Take 400 mg by mouth at bedtime., Disp: , Rfl:  .  methocarbamol (ROBAXIN) 500 MG tablet, TAKE ONE (1) TABLET BY MOUTH EVERY EIGHTHOURS AS NEEDED FOR MUSCLE SPASMS (Patient not taking: Reported on 11/24/2017), Disp: 270 tablet, Rfl: 3 .  mirtazapine (REMERON) 15 MG tablet, TAKE 1 TABLET BY MOUTH AT  BEDTIME (Patient not taking: Reported on 11/24/2017), Disp: 90 tablet, Rfl: 2 .  morphine (MS CONTIN) 15 MG 12 hr tablet, Take 1 tablet (15 mg total) by mouth 3 (three) times daily., Disp: 90 tablet, Rfl: 0 .  NON FORMULARY, Daith piercing in right ear for headaches, Disp: , Rfl:  .  oxybutynin (DITROPAN-XL) 10 MG 24 hr tablet, TAKE ONE TABLET BY MOUTH EVERY NIGHT AT BEDTIME (Patient not taking: Reported on 11/24/2017), Disp: 90 tablet, Rfl: 2 .  RABEprazole (ACIPHEX) 20 MG tablet, TAKE 1 TABLET BY MOUTH TWICE A DAY (Patient not taking: Reported on 11/24/2017), Disp: 180 tablet, Rfl: 3 .  ranitidine (ZANTAC) 150 MG capsule, Take 150 mg by mouth 2 (two) times daily., Disp: , Rfl:  .  TURMERIC PO, Take 1 capsule by mouth daily., Disp: , Rfl:   Allergies  Allergen Reactions  . Rizatriptan Benzoate Anaphylaxis    Throat and Tongue Swelling Closed  . Sumatriptan Other (See Comments)    Throat closed & Tongue Swelling  . Tizanidine Shortness Of Breath  . Aleve [Naproxen Sodium] Hives  .  Clarithromycin Hives  . Cyclobenzaprine Hcl     REACTION: THROAT CLOSED,TOUNGE SWELLING  . Doxycycline Diarrhea and Nausea And Vomiting    Reaction: unknown  . Duloxetine Nausea Only  . Keflex [Cephalexin] Itching    Sores  . Metronidazole Diarrhea and Nausea Only  . Oxycodone Hcl Diarrhea    Vomiting  . Oxycontin [Oxycodone Hcl] Nausea And Vomiting  . Penicillins     TOLERATES CEPHALOSPORINS Reaction: unknown Has patient had a PCN reaction causing immediate rash, facial/tongue/throat swelling, SOB or lightheadedness with hypotension: {unknown Has patient had a PCN reaction causing severe rash involving mucus membranes or skin necrosis: {unknown Has patient  had a PCN reaction that required hospitalization {unknown Has patient had a PCN reaction occurring within the last 10 years: {no If all of the above answers are "NO", then may proceed with Cephalosporin use.  . Rosuvastatin     REACTION: MUSCLE ACHES  . Septra [Sulfamethoxazole-Trimethoprim] Other (See Comments)    Rash and itching  . Venlafaxine Hives  . Voltaren [Diclofenac Sodium] Hives    oral  . Tramadol Diarrhea, Nausea And Vomiting and Palpitations   Review of Systems Objective:  There were no vitals filed for this visit.  General: Well developed, nourished, in no acute distress, alert and oriented x3   Dermatological: Skin is warm, dry and supple bilateral. Nails x 10 are well maintained; remaining integument appears unremarkable at this time. There are no open sores, no preulcerative lesions, no rash or signs of infection present.  Vascular: Dorsalis Pedis artery and Posterior Tibial artery pedal pulses are 2/4 bilateral with immedate capillary fill time. Pedal hair growth present. No varicosities and no lower extremity edema present bilateral.   Neruologic: Grossly intact via light touch bilateral. Vibratory intact via tuning fork bilateral. Protective threshold with Semmes Wienstein monofilament intact to all  pedal sites bilateral. Patellar and Achilles deep tendon reflexes 2+ bilateral. No Babinski or clonus noted bilateral.   Musculoskeletal: No gross boney pedal deformities bilateral. No pain, crepitus, or limitation noted with foot and ankle range of motion bilateral. Muscular strength 5/5 in all groups tested bilateral.  Gait: Unassisted, Nonantalgic.    Radiographs:  Radiographs taken today demonstrate moderate hallux abductovalgus deformities bilateral with early joint space narrowing first metatarsophalangeal joint of the left foot.  Hypertrophic medial condyles are noted bilaterally increase the first intermetatarsal angle greater than normal values bilateral as well as hallux abductus angle greater than normal values.  Elongated second metatarsals are noted.    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  Plan: Discussed etiology pathology conservative or surgical therapies.  At this point she like to have this surgically corrected we discussed in great detail today surgical consideration consisting of an [facility] bunion repair with screw fixation and a second metatarsal osteotomy double screw fixation left foot.  Answer all the questions regarding this procedure the best my ability layman's terms she understood it was amenable to it signed all 3 pages a consent form.  We did discuss the possible postop complications which may include but not limited to postop pain bleeding swelling infection recurrence need for further surgery overcorrection under correction.  We also dispensed a Cam walker today provided her with information regarding the surgery center information for preop as well as anesthesia.  She understands this is amenable to will follow up with me in the near future for surgical intervention.  She is followed by pain management for her back.  We are to continue her current pain regimen and add ibuprofen and  Tylenol if necessary.     Anh Bigos T. [place], [place]

## 2017-11-29 NOTE — Patient Instructions (Signed)
Pre-Operative Instructions  Congratulations, you have decided to take an important step towards improving your quality of life.  You can be assured that the doctors and staff at [facility] will be with you every step of the way.  Here are some important things you should know:  1. Plan to be at the surgery center/hospital at least 1 (one) hour prior to your scheduled time, unless otherwise directed by the surgical center/hospital staff.  You must have a responsible adult accompany you, remain during the surgery and drive you home.  Make sure you have directions to the surgical center/hospital to ensure you arrive on time. 2. If you are having surgery at [facility] or [facility] hospitals, you will need a copy of your medical history and physical form from your family physician within one month prior to the date of surgery. We will give you a form for your primary physician to complete.  3. We make every effort to accommodate the date you request for surgery.  However, there are times where surgery dates or times have to be moved.  We will contact you as soon as possible if a change in schedule is required.   4. No aspirin/ibuprofen for one week before surgery.  If you are on aspirin, any non-steroidal anti-inflammatory medications (Mobic, Aleve, Ibuprofen) should not be taken seven (7) days prior to your surgery.  You make take Tylenol for pain prior to surgery.  5. Medications - If you are taking daily heart and blood pressure medications, seizure, reflux, allergy, asthma, anxiety, pain or diabetes medications, make sure you notify the surgery center/hospital before the day of surgery so they can tell you which medications you should take or avoid the day of surgery. 6. No food or drink after midnight the night before surgery unless directed otherwise by surgical center/hospital staff. 7. No alcoholic beverages 24-hours prior to surgery.  No smoking 24-hours prior or 24-hours after  surgery. 8. Wear loose pants or shorts. They should be loose enough to fit over bandages, boots, and casts. 9. Don't wear slip-on shoes. Sneakers are preferred. 10. Bring your boot with you to the surgery center/hospital.  Also bring crutches or a walker if your physician has prescribed it for you.  If you do not have this equipment, it will be provided for you after surgery. 11. If you have not been contacted by the surgery center/hospital by the day before your surgery, call to confirm the date and time of your surgery. 12. Leave-time from work may vary depending on the type of surgery you have.  Appropriate arrangements should be made prior to surgery with your employer. 13. Prescriptions will be provided immediately following surgery by your doctor.  Fill these as soon as possible after surgery and take the medication as directed. Pain medications will not be refilled on weekends and must be approved by the doctor. 14. Remove nail polish on the operative foot and avoid getting pedicures prior to surgery. 15. Wash the night before surgery.  The night before surgery wash the foot and leg well with water and the antibacterial soap provided. Be sure to pay special attention to beneath the toenails and in between the toes.  Wash for at least three (3) minutes. Rinse thoroughly with water and dry well with a towel.  Perform this wash unless told not to do so by your physician.  Enclosed: 1 Ice pack (please put in freezer the night before surgery)   1 Hibiclens skin cleaner     Pre-op instructions  If you have any questions regarding the instructions, please do not hesitate to call our office.  [place]: [address], [place], [place] [address] -- [phone]  [place]: [address] [place], [place] [address] -- [phone]  [place]: [address]  [place], [place] [address] -- [phone]  [place]: [address], [address], [place], [place] [address] -- [phone]  Website:  [email or web address] 

## 2017-12-01 ENCOUNTER — Telehealth: Payer: Self-pay | Admitting: *Deleted

## 2017-12-01 NOTE — Telephone Encounter (Signed)
"  I saw Dr. Milinda Pointer Wednesday of this week, June 5.  We scheduled surgery for my left foot for July 5.  I need to change that date, we had some things come up.  I need to push it back to some time after August 14.  So if you could, see what's available after that time and let me know."

## 2017-12-08 NOTE — Telephone Encounter (Signed)
I left the patient a message that I was moving her surgery from 12/29/2017 to 02/09/2018.  I asked her to give me a call if this date is not good for her.  I called the surgical center and left a message for Caren Griffins to reschedule Ms. Rosenau's surgery from 12/29/2017 to 02/09/2018.

## 2017-12-20 ENCOUNTER — Encounter: Payer: Self-pay | Admitting: Registered Nurse

## 2017-12-20 ENCOUNTER — Encounter: Payer: Medicare Other | Attending: Physical Medicine & Rehabilitation | Admitting: Registered Nurse

## 2017-12-20 VITALS — BP 120/74 | HR 57 | Resp 14 | Ht 67.0 in | Wt 222.0 lb

## 2017-12-20 DIAGNOSIS — G8929 Other chronic pain: Secondary | ICD-10-CM | POA: Diagnosis not present

## 2017-12-20 DIAGNOSIS — Z79899 Other long term (current) drug therapy: Secondary | ICD-10-CM | POA: Diagnosis not present

## 2017-12-20 DIAGNOSIS — M25561 Pain in right knee: Secondary | ICD-10-CM | POA: Diagnosis not present

## 2017-12-20 DIAGNOSIS — Z5181 Encounter for therapeutic drug level monitoring: Secondary | ICD-10-CM | POA: Insufficient documentation

## 2017-12-20 DIAGNOSIS — M961 Postlaminectomy syndrome, not elsewhere classified: Secondary | ICD-10-CM

## 2017-12-20 DIAGNOSIS — M797 Fibromyalgia: Secondary | ICD-10-CM

## 2017-12-20 DIAGNOSIS — M545 Low back pain, unspecified: Secondary | ICD-10-CM

## 2017-12-20 DIAGNOSIS — M25562 Pain in left knee: Secondary | ICD-10-CM

## 2017-12-20 DIAGNOSIS — G894 Chronic pain syndrome: Secondary | ICD-10-CM | POA: Diagnosis not present

## 2017-12-20 DIAGNOSIS — G609 Hereditary and idiopathic neuropathy, unspecified: Secondary | ICD-10-CM | POA: Diagnosis not present

## 2017-12-20 DIAGNOSIS — Z79891 Long term (current) use of opiate analgesic: Secondary | ICD-10-CM | POA: Diagnosis not present

## 2017-12-20 MED ORDER — MORPHINE SULFATE ER 15 MG PO TBCR: 15.0000 mg | EXTENDED_RELEASE_TABLET | Freq: Three times a day (TID) | ORAL | 0 refills | Status: DC

## 2017-12-20 NOTE — Progress Notes (Signed)
Subjective:    Patient ID: [name], female    DOB: 1951/05/25, 67 y.o.   MRN: [id number]  HPI: [name] is a 67 year old female who returns for follow up appointment for chronic pain and medication refill. She states her pain is located in her mid-back. She rates her pain 4. Her current exercise regime is walking.   [name] Morphine Equivalent is 45.00 MME. Last UDS was Performed on 07/31/2017, it was consistent.   Pain Inventory Average Pain 5 Pain Right Now 4 My pain is constant, burning and aching  In the last 24 hours, has pain interfered with the following? General activity 5 Relation with others 5 Enjoyment of life 4 What TIME of day is your pain at its worst? evening, night Sleep (in general) Good  Pain is worse with: walking, bending, standing and some activites Pain improves with: rest, heat/ice, therapy/exercise, pacing activities, medication, TENS and injections Relief from Meds: 5  Mobility walk without assistance how many minutes can you walk? 10-15 ability to climb steps?  yes do you drive?  yes Do you have any goals in this area?  yes  Function employed # of hrs/week 10-12 disabled: date disabled 12/2008 I need assistance with the following:  meal prep, household duties and shopping Do you have any goals in this area?  yes  Neuro/Psych bladder control problems weakness numbness tremor tingling depression  Prior Studies Any changes since last visit?  no  Physicians involved in your care Any changes since last visit?  no   Family History  Problem Relation Age of Onset  . Breast cancer Mother   . Diabetes Mother   . Heart disease Mother   . Allergies Mother   . Diabetes Father   . Heart disease Father   . Emphysema Father   . Allergies Father   . Stomach cancer Maternal Uncle   . Breast cancer Paternal Aunt   . Colon cancer Neg Hx    Social History   Socioeconomic History  . Marital status: Married    Spouse name:  Not on file  . Number of children: Not on file  . Years of education: Not on file  . Highest education level: Not on file  Occupational History  . Occupation: Medical laboratory scientific officer  . Financial resource strain: Not on file  . Food insecurity:    Worry: Not on file    Inability: Not on file  . Transportation needs:    Medical: Not on file    Non-medical: Not on file  Tobacco Use  . Smoking status: Never Smoker  . Smokeless tobacco: Never Used  Substance and Sexual Activity  . Alcohol use: Yes    Alcohol/week: 0.0 oz    Comment: rare  . Drug use: No  . Sexual activity: Not on file  Lifestyle  . Physical activity:    Days per week: Not on file    Minutes per session: Not on file  . Stress: Not on file  Relationships  . Social connections:    Talks on phone: Not on file    Gets together: Not on file    Attends religious service: Not on file    Active member of club or organization: Not on file    Attends meetings of clubs or organizations: Not on file    Relationship status: Not on file  Other Topics Concern  . Not on file  Social History Narrative  . Not  on file   Past Surgical History:  Procedure Laterality Date  . [place] STUDY N/A 01/04/2016   Procedure: [place] STUDY;  Surgeon: Manus Gunning, MD;  [facility];  Service: Gastroenterology;  Laterality: N/A;  . ABDOMINAL HYSTERECTOMY  1988  . ADENOIDECTOMY    . ANTERIOR CERVICAL DECOMP/DISCECTOMY FUSION  2009, 1993   first procedure in ~ 1993/Dr Botero  . APPENDECTOMY  1988  . BACK SURGERY    . CHOLECYSTECTOMY  2005  . ESOPHAGEAL MANOMETRY N/A 01/04/2016   Procedure: ESOPHAGEAL MANOMETRY (EM) 24 HR PH;  Surgeon: Manus Gunning, MD;  [facility];  Service: Gastroenterology;  Laterality: N/A;  . ESOPHAGOGASTRODUODENOSCOPY (EGD) WITH PROPOFOL N/A 05/17/2016   Procedure: ESOPHAGOGASTRODUODENOSCOPY (EGD) WITH PROPOFOL;  Surgeon: Manus Gunning, MD;  [facility];  Service: Gastroenterology;  Laterality: N/A;  . NASAL SINUS SURGERY  2006  . SAVORY DILATION N/A 05/17/2016   Procedure: SAVORY DILATION;  Surgeon: Manus Gunning, MD;  Location: [facility];  Service: Gastroenterology;  Laterality: N/A;  . TONSILLECTOMY AND ADENOIDECTOMY    . TRANSTHORACIC ECHOCARDIOGRAM  2009   Normal LV size and function. EF 60-65%. No regional wall motion and amount is. Mild aortic sclerosis, no stenosis  . TUBAL LIGATION  1979   Past Medical History:  Diagnosis Date  . Allergy   . Anemia   . Arthritis   . B12 deficiency   . Complication of anesthesia    slow to wake up   . Depression   . Diabetes mellitus without complication ([place])    type II - diet controlled ; A1C 6.5 01/2017  . Fibromyalgia   . Generalized hyperhidrosis 04/17/2014  . GERD (gastroesophageal reflux disease)   . Heart murmur    Aortic Sclerosis  . Hyperlipemia   . HYPOTHYROIDISM 05/29/2007  . Irritable bowel syndrome   . Migraines    hx of   . Oxygen deficiency    2 L at bedtime  . 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 PONV (postoperative nausea and vomiting)   . PVC (premature ventricular contraction)   . Type II or unspecified type diabetes mellitus with neurological manifestations, not stated as uncontrolled(250.60) 04/07/2013  . Unspecified essential hypertension    denies htn on 05/09/16   BP 120/74 (BP Location: Right Arm, Patient Position: Sitting, Cuff Size: Large)   Pulse (!) 57   Resp 14   Ht 5\' 7"  (1.702 m)   Wt 222 lb (100.7 kg)   SpO2 98%   BMI 34.77 kg/m   Opioid Risk Score:   Fall Risk Score:  `1  Depression screen PHQ 2/9  Depression screen [facility] 2/9 11/24/2017 03/27/2017 02/28/2017 01/25/2017 01/02/2017 10/26/2016 07/04/2016  Decreased Interest 1 0 1 1 0 1 0  Down, Depressed, Hopeless 1 0 1 1 0 1 0  PHQ - 2 Score 2 0 2 2 0 2 0  Altered sleeping - - - - - - -  Tired, decreased energy - - - - - - -  Change in appetite - -  - - - - -  Feeling bad or failure about yourself  - - - - - - -  Trouble concentrating - - - - - - -  Moving slowly or fidgety/restless - - - - - - -  Suicidal thoughts - - - - - - -  PHQ-9 Score - - - - - - -  Difficult doing work/chores Not difficult at all - - - - - -  Some recent data might be hidden    Review of Systems  Constitutional: Negative.   HENT: Negative.   Eyes: Negative.   Respiratory: Negative.   Cardiovascular: Positive for leg swelling.  Gastrointestinal: Positive for constipation and diarrhea.  Endocrine: Negative.   Genitourinary: Negative.   Musculoskeletal: Positive for arthralgias, back pain and neck pain.  Skin: Negative.   Allergic/Immunologic: Negative.   Neurological: Positive for tremors, weakness and numbness.  Hematological: Negative.   Psychiatric/Behavioral: Positive for dysphoric mood.       Objective:   Physical Exam  Constitutional: She is oriented to person, place, and time. She appears well-developed and well-nourished.  HENT:  Head: Normocephalic and atraumatic.  Neck: Normal range of motion. Neck supple.  Cardiovascular: Normal rate and regular rhythm.  Pulmonary/Chest: Effort normal and breath sounds normal.  Musculoskeletal:  Normal Muscle Bulk and Muscle Testing Reveals: Upper Extremities: Full ROM and Muscle Strength 5/5 Thoracic Paraspinal Tenderness: T-7-T-9 Lower Extremities: Full ROM and Muscle Strength 5/5 Arises from Table with ease Narrow Based Gait  Neurological: She is alert and oriented to person, place, and time.  Skin: Skin is warm and dry.  Psychiatric: She has a normal mood and affect. Her behavior is normal.  Nursing note and vitals reviewed.         Assessment & Plan:  1. Cervical postlaminectomy syndrome: with neck and shoulder pain. 12/20/2017. Continue exercise routine and using heat therapy. Continue current medication regimen with MS Contin 15 mg one tablet three times a day #90.  We will  continue the opioid monitoring program, this consists of regular clinic visits, examinations, urine drug screen, pill counts as well as use of [place] Controlled Substance reporting System. 2. Fibromyalgia/ Neuropathy: Continue current medication regimen with Gabapentin, activity and exercise regime. 12/20/2017. 3. Bilateral Knee Pain/ Mild Degenerative Changes:.Continue current medication regimen with Voltaren Gel.12/20/2017 4. Right Ankle Pain: No Complaints Today. Continue HEP.12/20/2017 5. Muscle Spasm: Continue current medication regimen with  Robaxin. 12/20/2017 6. Opioid Induced Constipation: No complaints Continue to Monitor. 12/20/2017. 7. Midline Low Back Pain: Continue current medication regime, continue HEP as tolerated.12/20/2017 8. Right Gluteal Medius Weakness and Atrophy: No complaints Today:Continue with Hip Abduction Exercises: Continue to Monitor. 12/20/2017.  20 minutes of face to face patient care time was spent during this visit. All questions were encouraged and answered.   F/U in 1 month

## 2017-12-22 DIAGNOSIS — J9610 Chronic respiratory failure, unspecified whether with hypoxia or hypercapnia: Secondary | ICD-10-CM | POA: Diagnosis not present

## 2017-12-27 ENCOUNTER — Telehealth: Payer: Self-pay | Admitting: *Deleted

## 2017-12-27 NOTE — Telephone Encounter (Signed)
"  I have surgery scheduled for August 16 with Dr. Milinda Pointer.  We have had a death in my family.  The memorial service is the day after my surgery.  I don't think I would probably feel like going.  So I need to change it possibly to August 23 or 30 or anytime after that.  Whatever you can work out will be fine.  I would like to change it from August 16 to a little bit later than that.  You can leave a message if I'm not here."  I am returning your call.  We can reschedule you to August 23.  "Okay, that will be great. Thank you so much."  I'll call and let the surgical center know.   I called the surgical center and left a message for Renee to reschedule [name]'s surgery to August 23.

## 2018-01-03 ENCOUNTER — Ambulatory Visit: Payer: Medicare Other

## 2018-01-05 ENCOUNTER — Ambulatory Visit: Payer: Medicare Other

## 2018-01-05 ENCOUNTER — Telehealth: Payer: Self-pay

## 2018-01-05 DIAGNOSIS — E1140 Type 2 diabetes mellitus with diabetic neuropathy, unspecified: Secondary | ICD-10-CM

## 2018-01-05 DIAGNOSIS — E782 Mixed hyperlipidemia: Secondary | ICD-10-CM

## 2018-01-05 DIAGNOSIS — I10 Essential (primary) hypertension: Secondary | ICD-10-CM

## 2018-01-05 DIAGNOSIS — E559 Vitamin D deficiency, unspecified: Secondary | ICD-10-CM

## 2018-01-05 DIAGNOSIS — E538 Deficiency of other specified B group vitamins: Secondary | ICD-10-CM

## 2018-01-05 DIAGNOSIS — E039 Hypothyroidism, unspecified: Secondary | ICD-10-CM

## 2018-01-05 NOTE — Telephone Encounter (Signed)
-----   Message from Owens Loffler, MD sent at 01/04/2018  9:34 AM EDT ----- Regarding: RE: Labs 7/11 FLP, z00.00 Cbc with diff, HFP, BMET, TSH: z00.00  Vit D: E55.9  B12: E53.8   Z00.00 = healthcare maintenance  ----- Message ----- From: Eustace Pen, LPN Sent: 12/26/8204   3:52 PM To: Owens Loffler, MD Subject: Labs 7/11                                      Lab orders needed. Thank you.  Insurance:  [facility] Medicare

## 2018-01-08 NOTE — Telephone Encounter (Signed)
Addendum created to add A1C per MD order

## 2018-01-08 NOTE — Addendum Note (Signed)
Addended by: Eustace Pen on: 01/08/2018 08:01 AM   Modules accepted: Orders

## 2018-01-10 ENCOUNTER — Ambulatory Visit: Payer: Medicare Other | Admitting: Family Medicine

## 2018-01-10 ENCOUNTER — Ambulatory Visit (INDEPENDENT_AMBULATORY_CARE_PROVIDER_SITE_OTHER): Payer: Medicare Other

## 2018-01-10 VITALS — BP 112/64 | HR 77 | Temp 98.4°F | Ht 65.0 in | Wt 223.2 lb

## 2018-01-10 DIAGNOSIS — I10 Essential (primary) hypertension: Secondary | ICD-10-CM

## 2018-01-10 DIAGNOSIS — E538 Deficiency of other specified B group vitamins: Secondary | ICD-10-CM

## 2018-01-10 DIAGNOSIS — Z0000 Encounter for general adult medical examination without abnormal findings: Secondary | ICD-10-CM | POA: Diagnosis not present

## 2018-01-10 DIAGNOSIS — E1140 Type 2 diabetes mellitus with diabetic neuropathy, unspecified: Secondary | ICD-10-CM

## 2018-01-10 DIAGNOSIS — E559 Vitamin D deficiency, unspecified: Secondary | ICD-10-CM

## 2018-01-10 DIAGNOSIS — E782 Mixed hyperlipidemia: Secondary | ICD-10-CM | POA: Diagnosis not present

## 2018-01-10 DIAGNOSIS — E039 Hypothyroidism, unspecified: Secondary | ICD-10-CM

## 2018-01-10 LAB — CBC WITH DIFFERENTIAL/PLATELET
Basophils Absolute: 0 10*3/uL (ref 0.0–0.1)
Basophils Relative: 0.2 % (ref 0.0–3.0)
Eosinophils Absolute: 0.1 10*3/uL (ref 0.0–0.7)
Eosinophils Relative: 1.6 % (ref 0.0–5.0)
HCT: 37.6 % (ref 36.0–46.0)
Hemoglobin: 13 g/dL (ref 12.0–15.0)
Lymphocytes Relative: 25.6 % (ref 12.0–46.0)
Lymphs Abs: 1.6 10*3/uL (ref 0.7–4.0)
MCHC: 34.5 g/dL (ref 30.0–36.0)
MCV: 88.7 fl (ref 78.0–100.0)
Monocytes Absolute: 0.4 10*3/uL (ref 0.1–1.0)
Monocytes Relative: 5.6 % (ref 3.0–12.0)
Neutro Abs: 4.3 10*3/uL (ref 1.4–7.7)
Neutrophils Relative %: 67 % (ref 43.0–77.0)
Platelets: 276 10*3/uL (ref 150.0–400.0)
RBC: 4.24 Mil/uL (ref 3.87–5.11)
RDW: 12.7 % (ref 11.5–15.5)
WBC: 6.4 10*3/uL (ref 4.0–10.5)

## 2018-01-10 LAB — BASIC METABOLIC PANEL
BUN: 13 mg/dL (ref 6–23)
CO2: 33 mEq/L — ABNORMAL HIGH (ref 19–32)
Calcium: 9.6 mg/dL (ref 8.4–10.5)
Chloride: 99 mEq/L (ref 96–112)
Creatinine, Ser: 0.87 mg/dL (ref 0.40–1.20)
GFR: 69.2 mL/min (ref >60.00)
Glucose, Bld: 188 mg/dL — ABNORMAL HIGH (ref 70–99)
Potassium: 4 mEq/L (ref 3.5–5.1)
Sodium: 138 mEq/L (ref 135–145)

## 2018-01-10 LAB — HEPATIC FUNCTION PANEL
ALT: 33 U/L (ref 0–35)
AST: 29 U/L (ref 0–37)
Albumin: 4.1 g/dL (ref 3.5–5.2)
Alkaline Phosphatase: 113 U/L (ref 39–117)
Bilirubin, Direct: 0 mg/dL (ref 0.0–0.3)
Total Bilirubin: 0.3 mg/dL (ref 0.2–1.2)
Total Protein: 7.3 g/dL (ref 6.0–8.3)

## 2018-01-10 LAB — LIPID PANEL
Cholesterol: 176 mg/dL (ref 0–200)
HDL: 44.4 mg/dL (ref >39.00)
LDL Cholesterol: 97 mg/dL (ref 0–99)
NonHDL: 131.2
Total CHOL/HDL Ratio: 4
Triglycerides: 169 mg/dL — ABNORMAL HIGH (ref 0.0–149.0)
VLDL: 33.8 mg/dL (ref 0.0–40.0)

## 2018-01-10 LAB — VITAMIN D 25 HYDROXY (VIT D DEFICIENCY, FRACTURES): VITD: 39.07 ng/mL (ref 30.00–100.00)

## 2018-01-10 LAB — TSH: TSH: 4.2 u[IU]/mL (ref 0.35–4.50)

## 2018-01-10 LAB — VITAMIN B12: Vitamin B-12: 500 pg/mL (ref 211–911)

## 2018-01-10 LAB — MICROALBUMIN / CREATININE URINE RATIO
Creatinine,U: 72.8 mg/dL
Microalb Creat Ratio: 2.1 mg/g (ref 0.0–30.0)
Microalb, Ur: 1.5 mg/dL (ref 0.0–1.9)

## 2018-01-10 LAB — HEMOGLOBIN A1C: Hgb A1c MFr Bld: 7 % — ABNORMAL HIGH (ref 4.6–6.5)

## 2018-01-10 NOTE — Patient Instructions (Addendum)
[name] , Thank you for taking time to come for your Medicare Wellness Visit. I appreciate your ongoing commitment to your health goals. Please review the following plan we discussed and let me know if I can assist you in the future.   These are the goals we discussed: Goals    . Patient Stated     Starting 01/10/18, I will continue to take medications as prescribed.        This is a list of the screening recommended for you and due dates:  Health Maintenance  Topic Date Due  . DEXA scan (bone density measurement)  06/26/2049*  . Colon Cancer Screening  06/26/2049*  . Flu Shot  01/25/2018  . Stool Blood Test  04/18/2018  . Pneumonia vaccines (2 of 2 - PPSV23) 05/06/2018  . Hemoglobin A1C  07/13/2018  . Eye exam for diabetics  08/09/2018  . Complete foot exam   11/30/2018  . Urine Protein Check  01/11/2019  . Mammogram  06/17/2019  . Tetanus Vaccine  10/17/2023  .  Hepatitis C: One time screening is recommended by Center for Disease Control  (CDC) for  adults born from [birth year] through 1964.   Completed  *Topic was postponed. The date shown is not the original due date.   Preventive Care for Adults  A healthy lifestyle and preventive care can promote health and wellness. Preventive health guidelines for adults include the following key practices.  . A routine yearly physical is a good way to check with your health care provider about your health and preventive screening. It is a chance to share any concerns and updates on your health and to receive a thorough exam.  . Visit your dentist for a routine exam and preventive care every 6 months. Brush your teeth twice a day and floss once a day. Good oral hygiene prevents tooth decay and gum disease.  . The frequency of eye exams is based on your age, health, family medical history, use  of contact lenses, and other factors. Follow your health care provider's recommendations for frequency of eye exams.  . Eat a healthy diet. Foods  like vegetables, fruits, whole grains, low-fat dairy products, and lean protein foods contain the nutrients you need without too many calories. Decrease your intake of foods high in solid fats, added sugars, and salt. Eat the right amount of calories for you. Get information about a proper diet from your health care provider, if necessary.  . Regular physical exercise is one of the most important things you can do for your health. Most adults should get at least 150 minutes of moderate-intensity exercise (any activity that increases your heart rate and causes you to sweat) each week. In addition, most adults need muscle-strengthening exercises on 2 or more days a week.  Silver Sneakers may be a benefit available to you. To determine eligibility, you may visit the website: www.silversneakers.com or contact program at 475-591-3958 Mon-Fri between 8AM-8PM.   . Maintain a healthy weight. The body mass index (BMI) is a screening tool to identify possible weight problems. It provides an estimate of body fat based on height and weight. Your health care provider can find your BMI and can help you achieve or maintain a healthy weight.   For adults 20 years and older: ? A BMI below 18.5 is considered underweight. ? A BMI of 18.5 to 24.9 is normal. ? A BMI of 25 to 29.9 is considered overweight. ? A BMI of 30 and above is  considered obese.   . Maintain normal blood lipids and cholesterol levels by exercising and minimizing your intake of saturated fat. Eat a balanced diet with plenty of fruit and vegetables. Blood tests for lipids and cholesterol should begin at age 81 and be repeated every 5 years. If your lipid or cholesterol levels are high, you are over 50, or you are at high risk for heart disease, you may need your cholesterol levels checked more frequently. Ongoing high lipid and cholesterol levels should be treated with medicines if diet and exercise are not working.  . If you smoke, find out from  your health care provider how to quit. If you do not use tobacco, please do not start.  . If you choose to drink alcohol, please do not consume more than 2 drinks per day. One drink is considered to be 12 ounces (355 mL) of beer, 5 ounces (148 mL) of wine, or 1.5 ounces (44 mL) of liquor.  . If you are 35-39 years old, ask your health care provider if you should take aspirin to prevent strokes.  . Use sunscreen. Apply sunscreen liberally and repeatedly throughout the day. You should seek shade when your shadow is shorter than you. Protect yourself by wearing long sleeves, pants, a wide-brimmed hat, and sunglasses year round, whenever you are outdoors.  . Once a month, do a whole body skin exam, using a mirror to look at the skin on your back. Tell your health care provider of new moles, moles that have irregular borders, moles that are larger than a pencil eraser, or moles that have changed in shape or color.

## 2018-01-10 NOTE — Progress Notes (Signed)
PCP notes:   Health maintenance:  Bone density - PCP please discuss at next appt A1C - completed Microalbumin - completed  Abnormal screenings:   Depression score: 4 Depression screen [facility] 2/9 01/10/2018 11/24/2017 03/27/2017 02/28/2017 01/25/2017  Decreased Interest 2 1 0 1 1  Down, Depressed, Hopeless 0 1 0 1 1  PHQ - 2 Score 2 2 0 2 2  Altered sleeping 0 - - - -  Tired, decreased energy 1 - - - -  Change in appetite 0 - - - -  Feeling bad or failure about yourself  0 - - - -  Trouble concentrating 1 - - - -  Moving slowly or fidgety/restless 0 - - - -  Suicidal thoughts 0 - - - -  PHQ-9 Score 4 - - - -  Difficult doing work/chores Not difficult at all Not difficult at all - - -  Some recent data might be hidden   Patient concerns:   None  Nurse concerns:  None  Next PCP appt:   01/17/18 @ 1440

## 2018-01-10 NOTE — Progress Notes (Signed)
Subjective:   [name] is a 67 y.o. female who presents for Medicare Annual (Subsequent) preventive examination.  Review of Systems:  N/A Cardiac Risk Factors include: advanced age (>78men, >70 women);diabetes mellitus;dyslipidemia;obesity (BMI >30kg/m2);hypertension     Objective:     Vitals: BP 112/64 (BP Location: Right Arm, Patient Position: Sitting, Cuff Size: Large)   Pulse 77   Temp 98.4 F (36.9 C) (Oral)   Ht 5\' 5"  (1.651 m) Comment: no shoes  Wt 223 lb 4 oz (101.3 kg)   SpO2 96%   BMI 37.15 kg/m   Body mass index is 37.15 kg/m.  Advanced Directives 01/10/2018 04/26/2017 03/27/2017 03/01/2017 02/28/2017 01/25/2017 01/02/2017  Does Patient Have a Medical Advance Directive? Yes Yes Yes Yes Yes Yes Yes  Type of Paramedic of [place];Living will [place];Living will - Living will;Healthcare Power of [place];Living will [place];Living will [place];Living will  Does patient want to make changes to medical advance directive? - - - - - - -  Copy of [place] in Chart? Yes No - copy requested - - No - copy requested No - copy requested No - copy requested    Tobacco Social History   Tobacco Use  Smoking Status Never Smoker  Smokeless Tobacco Never Used     Counseling given: No   Clinical Intake:  Pre-visit preparation completed: Yes  Pain : 0-10 Pain Score: 4  Pain Type: Chronic pain Pain Location: Back     Nutritional Status: BMI > 30  Obese Nutritional Risks: None Diabetes: Yes CBG done?: No Did pt. bring in CBG monitor from home?: No  How often do you need to have someone help you when you read instructions, pamphlets, or other written materials from your doctor or pharmacy?: 1 - Never What is the last grade level you completed in school?: 12th grade + some college  Interpreter Needed?: No  Comments: pt lives with  spouse Information entered by :: LPinson, LPN  Past Medical History:  Diagnosis Date  . Allergy   . Anemia   . Arthritis   . B12 deficiency   . Complication of anesthesia    slow to wake up   . Depression   . Diabetes mellitus without complication ([place])    type II - diet controlled ; A1C 6.5 01/2017  . Fibromyalgia   . Generalized hyperhidrosis 04/17/2014  . GERD (gastroesophageal reflux disease)   . Heart murmur    Aortic Sclerosis  . Hyperlipemia   . HYPOTHYROIDISM 05/29/2007  . Irritable bowel syndrome   . Migraines    hx of   . Oxygen deficiency    2 L at bedtime  . 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 PONV (postoperative nausea and vomiting)   . PVC (premature ventricular contraction)   . Type II or unspecified type diabetes mellitus with neurological manifestations, not stated as uncontrolled(250.60) 04/07/2013  . Unspecified essential hypertension    denies htn on 05/09/16   Past Surgical History:  Procedure Laterality Date  . [place] STUDY N/A 01/04/2016   Procedure: [place] STUDY;  Surgeon: Manus Gunning, MD;  Location: [facility];  Service: Gastroenterology;  Laterality: N/A;  . ABDOMINAL HYSTERECTOMY  1988  . ADENOIDECTOMY    . ANTERIOR CERVICAL DECOMP/DISCECTOMY FUSION  2009, 1993   first procedure in ~ 1993/Dr  Botero  . APPENDECTOMY  1988  . BACK SURGERY    . CHOLECYSTECTOMY  2005  . ESOPHAGEAL MANOMETRY N/A 01/04/2016   Procedure: ESOPHAGEAL MANOMETRY (EM) 24 HR PH;  Surgeon: Manus Gunning, MD;  Location: [facility];  Service: Gastroenterology;  Laterality: N/A;  . ESOPHAGOGASTRODUODENOSCOPY (EGD) WITH PROPOFOL N/A 05/17/2016   Procedure: ESOPHAGOGASTRODUODENOSCOPY (EGD) WITH PROPOFOL;  Surgeon: Manus Gunning, MD;  Location: [facility];  Service: Gastroenterology;  Laterality: N/A;  . NASAL SINUS SURGERY  2006  . SAVORY DILATION N/A 05/17/2016   Procedure: SAVORY DILATION;  Surgeon: Manus Gunning, MD;  Location: [facility];  Service: Gastroenterology;  Laterality: N/A;  . TONSILLECTOMY AND ADENOIDECTOMY    . TRANSTHORACIC ECHOCARDIOGRAM  2009   Normal LV size and function. EF 60-65%. No regional wall motion and amount is. Mild aortic sclerosis, no stenosis  . TUBAL LIGATION  1979   Family History  Problem Relation Age of Onset  . Breast cancer Mother   . Diabetes Mother   . Heart disease Mother   . Allergies Mother   . Diabetes Father   . Heart disease Father   . Emphysema Father   . Allergies Father   . Stomach cancer Maternal Uncle   . Breast cancer Paternal Aunt   . Colon cancer Neg Hx    Social History   Socioeconomic History  . Marital status: Married    Spouse name: Not on file  . Number of children: Not on file  . Years of education: Not on file  . Highest education level: Not on file  Occupational History  . Occupation: Medical laboratory scientific officer  . Financial resource strain: Not on file  . Food insecurity:    Worry: Not on file    Inability: Not on file  . Transportation needs:    Medical: Not on file    Non-medical: Not on file  Tobacco Use  . Smoking status: Never Smoker  . Smokeless tobacco: Never Used  Substance and Sexual Activity  . Alcohol use: Yes    Alcohol/week: 0.0 oz    Comment: rare  . Drug use: No  . Sexual activity: Not on file  Lifestyle  . Physical activity:    Days per week: Not on file    Minutes per session: Not on file  . Stress: Not on file  Relationships  . Social connections:    Talks on phone: Not on file    Gets together: Not on file    Attends religious service: Not on file    Active member of club or organization: Not on file    Attends meetings of clubs or organizations: Not on file    Relationship status: Not on file  Other Topics Concern  . Not on file  Social History Narrative  . Not on file    Outpatient Encounter Medications as of 01/10/2018  Medication Sig  . B Complex Vitamins (B  COMPLEX 100 PO) Take 100 mg by mouth daily.   . bisacodyl (DULCOLAX) 5 MG EC tablet Take 5 mg by mouth daily as needed for moderate constipation. Take 1-2 at hs  . Calcium Carbonate (CALCIUM 600 PO) Take 600 mg by mouth 2 (two) times daily.   . carboxymethylcellulose (REFRESH PLUS) 0.5 % SOLN Place 1 drop into both eyes 3 (three) times daily as needed.   . cetirizine (ZYRTEC) 10 MG tablet Take 10 mg by mouth daily.  . Cholecalciferol (VITAMIN D) 2000 UNITS tablet Take 2,000  Units by mouth daily.    . citalopram (CELEXA) 40 MG tablet TAKE 1 TABLET BY MOUTH  DAILY  . Coenzyme Q-10 100 MG capsule Take 100 mg by mouth daily.  . diclofenac sodium (VOLTAREN) 1 % GEL Apply 4 g topically 4 (four) times daily as needed.  . docusate sodium (COLACE) 100 MG capsule Take 400 mg by mouth at bedtime.   . gabapentin (NEURONTIN) 300 MG capsule Take 1 capsule (300 mg total) by mouth 3 (three) times daily.  Marland Kitchen GRAPE SEED EXTRACT PO Take by mouth. 1 tablet daily  . levothyroxine (SYNTHROID, LEVOTHROID) 50 MCG tablet TAKE 1 TABLET BY MOUTH  DAILY  . MAGNESIUM GLYCINATE PLUS PO Take 400 mg by mouth at bedtime.  . methocarbamol (ROBAXIN) 500 MG tablet TAKE ONE (1) TABLET BY MOUTH EVERY EIGHTHOURS AS NEEDED FOR MUSCLE SPASMS  . mirtazapine (REMERON) 15 MG tablet TAKE 1 TABLET BY MOUTH AT  BEDTIME  . morphine (MS CONTIN) 15 MG 12 hr tablet Take 1 tablet (15 mg total) by mouth 3 (three) times daily.  . NON FORMULARY Daith piercing in right ear for headaches  . oxybutynin (DITROPAN-XL) 10 MG 24 hr tablet TAKE ONE TABLET BY MOUTH EVERY NIGHT AT BEDTIME  . RABEprazole (ACIPHEX) 20 MG tablet TAKE 1 TABLET BY MOUTH TWICE A DAY  . ranitidine (ZANTAC) 150 MG capsule Take 150 mg by mouth 2 (two) times daily.  . TURMERIC PO Take 800 mg by mouth daily.   . [DISCONTINUED] benzonatate (TESSALON PERLES) 100 MG capsule Take 1-2 capsules (100-200 mg total) by mouth every 8 (eight) hours as needed for cough.  . [DISCONTINUED]  loratadine (CLARITIN) 10 MG tablet Take 10 mg by mouth daily.   No facility-administered encounter medications on file as of 01/10/2018.     Activities of Daily Living In your present state of health, do you have any difficulty performing the following activities: 01/10/2018  Hearing? N  Vision? N  Difficulty concentrating or making decisions? N  Walking or climbing stairs? N  Dressing or bathing? N  Doing errands, shopping? N  Preparing Food and eating ? N  Using the Toilet? N  In the past six months, have you accidently leaked urine? N  Do you have problems with loss of bowel control? N  Managing your Medications? N  Managing your Finances? N  Housekeeping or managing your Housekeeping? N  Some recent data might be hidden    Patient Care Team: Owens Loffler, MD as PCP - General Kirsteins, Luanna Salk, MD as Consulting Physician (Physical Medicine and Rehabilitation) Armbruster, Carlota Raspberry, MD as Consulting Physician (Gastroenterology) [name], MD as Consulting Physician (Cardiology) Chesley Mires, MD as Consulting Physician (Pulmonary Disease)    Assessment:   This is a routine wellness examination for [place].   Hearing Screening   125Hz  250Hz  500Hz  1000Hz  2000Hz  3000Hz  4000Hz  6000Hz  8000Hz   Right ear:   40 40 40  40    Left ear:   40 40 40  40    Vision Screening Comments: Vision exam in Feb 2019 with Dr. Marvel Plan  Exercise Activities and Dietary recommendations Current Exercise Habits: Home exercise routine, Type of exercise: stretching;walking, Time (Minutes): 30, Frequency (Times/Week): 2, Weekly Exercise (Minutes/Week): 60, Intensity: Mild, Exercise limited by: orthopedic condition(s)  Goals    . Patient Stated     Starting 01/10/18, I will continue to take medications as prescribed.        Fall Risk Fall Risk  01/10/2018 12/20/2017 11/24/2017  10/18/2017 08/23/2017  Falls in the past year? No No No No No  Comment - - - - -  Number falls in past yr: - - - - -   Injury with Fall? - - - - -  Risk for fall due to : - - - - -  Follow up - - - - -   Depression Screen PHQ 2/9 Scores 01/10/2018 11/24/2017 03/27/2017 02/28/2017  PHQ - 2 Score 2 2 0 2  PHQ- 9 Score 4 - - -     Cognitive Function MMSE - Mini Mental State Exam 01/10/2018 01/02/2017  Orientation to time 5 5  Orientation to Place 5 5  Registration 3 3  Attention/ Calculation 0 0  Recall 3 3  Language- name 2 objects 0 0  Language- repeat 1 1  Language- follow 3 step command 3 3  Language- read & follow direction 0 0  Write a sentence 0 0  Copy design 0 0  Total score 20 20     PLEASE NOTE: A Mini-Cog screen was completed. Maximum score is 20. A value of 0 denotes this part of [place] MMSE was not completed or the patient failed this part of the Mini-Cog screening.   Mini-Cog Screening Orientation to Time - Max 5 pts Orientation to Place - Max 5 pts Registration - Max 3 pts Recall - Max 3 pts Language Repeat - Max 1 pts Language Follow 3 Step Command - Max 3 pts    Immunization History  Administered Date(s) Administered  . Pneumococcal Conjugate-13 01/02/2017  . Pneumococcal Polysaccharide-23 05/06/2013  . Td 06/28/2003, 10/16/2013  . Zoster 11/07/2012    Screening Tests Health Maintenance  Topic Date Due  . DEXA SCAN  06/26/2049 (Originally 11/01/2016)  . COLONOSCOPY  06/26/2049 (Originally 10/19/2016)  . INFLUENZA VACCINE  01/25/2018  . COLON CANCER SCREENING ANNUAL FOBT  04/18/2018  . PNA vac Low Risk Adult (2 of 2 - PPSV23) 05/06/2018  . HEMOGLOBIN A1C  07/13/2018  . OPHTHALMOLOGY EXAM  08/09/2018  . FOOT EXAM  11/30/2018  . URINE MICROALBUMIN  01/11/2019  . MAMMOGRAM  06/17/2019  . TETANUS/TDAP  10/17/2023  . Hepatitis C Screening  Completed      Plan:     I have personally reviewed, addressed, and noted the following in the patient's chart:  A. Medical and social history B. Use of alcohol, tobacco or illicit drugs  C. Current medications and  supplements D. Functional ability and status E.  Nutritional status F.  Physical activity G. Advance directives H. List of other physicians I.  Hospitalizations, surgeries, and ER visits in previous 12 months J.  [place] to include hearing, vision, cognitive, depression L. Referrals and appointments - none  In addition, I have reviewed and discussed with patient certain preventive protocols, quality metrics, and best practice recommendations. A written personalized care plan for preventive services as well as general preventive health recommendations were provided to patient.  See attached scanned questionnaire for additional information.   Signed,   Lindell Noe, MHA, BS, LPN Health Coach

## 2018-01-10 NOTE — Progress Notes (Signed)
I reviewed health advisor's note, was available for consultation, and agree with documentation and plan.   Signed,  Analilia Geddis T. Cerena Baine, MD  

## 2018-01-13 ENCOUNTER — Other Ambulatory Visit: Payer: Self-pay | Admitting: Family Medicine

## 2018-01-15 ENCOUNTER — Other Ambulatory Visit: Payer: Self-pay | Admitting: Family Medicine

## 2018-01-15 NOTE — Telephone Encounter (Signed)
Electronic refill request Last refill 03/08/17 #90/2 Last office visit 01/09/18 Upcoming appointment 01/23/18

## 2018-01-17 ENCOUNTER — Encounter: Payer: Self-pay | Admitting: Family Medicine

## 2018-01-17 ENCOUNTER — Ambulatory Visit (INDEPENDENT_AMBULATORY_CARE_PROVIDER_SITE_OTHER): Payer: Medicare Other | Admitting: Family Medicine

## 2018-01-17 VITALS — BP 122/76 | HR 66 | Temp 98.6°F | Ht 65.0 in | Wt 222.5 lb

## 2018-01-17 DIAGNOSIS — Z0000 Encounter for general adult medical examination without abnormal findings: Secondary | ICD-10-CM

## 2018-01-17 DIAGNOSIS — M810 Age-related osteoporosis without current pathological fracture: Secondary | ICD-10-CM

## 2018-01-17 DIAGNOSIS — M8580 Other specified disorders of bone density and structure, unspecified site: Secondary | ICD-10-CM

## 2018-01-17 NOTE — Progress Notes (Signed)
Dr. Frederico Hamman T. Copland, MD, [facility] [place], [address] Phone: [phone] Fax: [phone]  01/17/2018  Patient: [name], MRN: [id number], DOB: 02-17-1951, 66 y.o.  Primary Physician:  Owens Loffler, MD   Chief Complaint  Patient presents with  . Annual Exam   Subjective:   [name] is a 67 y.o. pleasant patient who presents with the following:  Health Maintenance Summary Reviewed and updated, unless pt declines services.  Tobacco History Reviewed. Non-smoker Alcohol: No concerns, no excessive use Exercise Habits: Some activity, rec at least 30 mins 5 times a week STD concerns: none Drug Use: None Birth control method: Menses regular: yes Lumps or breast concerns: no Breast Cancer Family History: no  Health Maintenance  Topic Date Due  . DEXA SCAN  06/26/2049 (Originally 11/01/2016)  . COLONOSCOPY  06/26/2049 (Originally 10/19/2016)  . INFLUENZA VACCINE  01/25/2018  . COLON CANCER SCREENING ANNUAL FOBT  04/18/2018  . PNA vac Low Risk Adult (2 of 2 - PPSV23) 05/06/2018  . HEMOGLOBIN A1C  07/13/2018  . OPHTHALMOLOGY EXAM  08/09/2018  . FOOT EXAM  11/30/2018  . URINE MICROALBUMIN  01/11/2019  . MAMMOGRAM  06/17/2019  . TETANUS/TDAP  10/17/2023  . Hepatitis C Screening  Completed    Immunization History  Administered Date(s) Administered  . Pneumococcal Conjugate-13 01/02/2017  . Pneumococcal Polysaccharide-23 05/06/2013  . Td 06/28/2003, 10/16/2013  . Zoster 11/07/2012   Patient Active Problem List   Diagnosis Date Noted  . Type 2 diabetes mellitus with diabetic neuropathy ([place]) 04/07/2013    Priority: High  . Postlaminectomy syndrome, cervical region 08/29/2011    Priority: High  . FIBROMYALGIA 01/11/2007    Priority: High  . Depression, major, recurrent, in partial remission ([place]) 09/22/2008    Priority: Medium  . Hypothyroidism 05/29/2007    Priority: Medium  .  HYPERLIPIDEMIA 05/29/2007    Priority: Medium  . Essential hypertension 11/13/2006    Priority: Medium  . Lipoma of buttock 01/10/2017  . Chronic respiratory failure with hypoxia ([place]) 04/07/2015  . Obesity hypoventilation syndrome ([place]) 03/26/2015  . Sleep related hypoxia 03/09/2015  . Generalized hyperhidrosis 04/17/2014  . Obesity (BMI 30-39.9) 08/23/2013  . 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 Dysphagia 07/14/2010  . FATIGUE 05/04/2009  . TRANSAMINASES, SERUM, ELEVATED 12/24/2008  . ALLERGIC RHINITIS 09/22/2008  . Vitamin D deficiency 01/11/2007  . Vitamin B 12 deficiency 11/13/2006  . GERD 11/13/2006   Past Medical History:  Diagnosis Date  . Allergy   . Anemia   . Arthritis   . B12 deficiency   . Complication of anesthesia    slow to wake up   . Depression   . Diabetes mellitus without complication ([place])    type II - diet controlled ; A1C 6.5 01/2017  . Fibromyalgia   . Generalized hyperhidrosis 04/17/2014  . GERD (gastroesophageal reflux disease)   . Heart murmur    Aortic Sclerosis  . Hyperlipemia   . HYPOTHYROIDISM 05/29/2007  . Irritable bowel syndrome   . Migraines    hx of   . Oxygen deficiency    2 L at bedtime  . 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 PONV (postoperative nausea and vomiting)   . PVC (premature ventricular contraction)   . Type II or unspecified type diabetes mellitus with neurological manifestations, not stated as uncontrolled(250.60) 04/07/2013  .  Unspecified essential hypertension    denies htn on 05/09/16   Past Surgical History:  Procedure Laterality Date  . [place] STUDY N/A 01/04/2016   Procedure: [place] STUDY;  Surgeon: Manus Gunning, MD;  Location: [facility];  Service: Gastroenterology;  Laterality: N/A;  . ABDOMINAL HYSTERECTOMY  1988  . ADENOIDECTOMY    . ANTERIOR CERVICAL DECOMP/DISCECTOMY FUSION  2009, 1993   first procedure in ~ 1993/Dr  Botero  . APPENDECTOMY  1988  . BACK SURGERY    . CHOLECYSTECTOMY  2005  . ESOPHAGEAL MANOMETRY N/A 01/04/2016   Procedure: ESOPHAGEAL MANOMETRY (EM) 24 HR PH;  Surgeon: Manus Gunning, MD;  Location: [facility];  Service: Gastroenterology;  Laterality: N/A;  . ESOPHAGOGASTRODUODENOSCOPY (EGD) WITH PROPOFOL N/A 05/17/2016   Procedure: ESOPHAGOGASTRODUODENOSCOPY (EGD) WITH PROPOFOL;  Surgeon: Manus Gunning, MD;  Location: [facility];  Service: Gastroenterology;  Laterality: N/A;  . NASAL SINUS SURGERY  2006  . SAVORY DILATION N/A 05/17/2016   Procedure: SAVORY DILATION;  Surgeon: Manus Gunning, MD;  Location: [facility];  Service: Gastroenterology;  Laterality: N/A;  . TONSILLECTOMY AND ADENOIDECTOMY    . TRANSTHORACIC ECHOCARDIOGRAM  2009   Normal LV size and function. EF 60-65%. No regional wall motion and amount is. Mild aortic sclerosis, no stenosis  . TUBAL LIGATION  1979   Social History   Socioeconomic History  . Marital status: Married    Spouse name: Not on file  . Number of children: Not on file  . Years of education: Not on file  . Highest education level: Not on file  Occupational History  . Occupation: Medical laboratory scientific officer  . Financial resource strain: Not on file  . Food insecurity:    Worry: Not on file    Inability: Not on file  . Transportation needs:    Medical: Not on file    Non-medical: Not on file  Tobacco Use  . Smoking status: Never Smoker  . Smokeless tobacco: Never Used  Substance and Sexual Activity  . Alcohol use: Yes    Alcohol/week: 0.0 oz    Comment: rare  . Drug use: No  . Sexual activity: Not on file  Lifestyle  . Physical activity:    Days per week: Not on file    Minutes per session: Not on file  . Stress: Not on file  Relationships  . Social connections:    Talks on phone: Not on file    Gets together: Not on file    Attends religious service: Not on file    Active member of club or  organization: Not on file    Attends meetings of clubs or organizations: Not on file    Relationship status: Not on file  . Intimate partner violence:    Fear of current or ex partner: Not on file    Emotionally abused: Not on file    Physically abused: Not on file    Forced sexual activity: Not on file  Other Topics Concern  . Not on file  Social History Narrative  . Not on file   Family History  Problem Relation Age of Onset  . Breast cancer Mother   . Diabetes Mother   . Heart disease Mother   . Allergies Mother   . Diabetes Father   . Heart disease Father   . Emphysema Father   . Allergies Father   . Stomach cancer Maternal Uncle   . Breast cancer Paternal Aunt   .  Colon cancer Neg Hx    Allergies  Allergen Reactions  . Rizatriptan Benzoate Anaphylaxis    Throat and Tongue Swelling Closed  . Sumatriptan Other (See Comments)    Throat closed & Tongue Swelling  . Tizanidine Shortness Of Breath  . Aleve [Naproxen Sodium] Hives  . Clarithromycin Hives  . Cyclobenzaprine Hcl     REACTION: THROAT CLOSED,TOUNGE SWELLING  . Doxycycline Diarrhea and Nausea And Vomiting    Reaction: unknown  . Duloxetine Nausea Only  . Keflex [Cephalexin] Itching    Sores  . Metronidazole Diarrhea and Nausea Only  . Oxycodone Hcl Diarrhea    Vomiting  . Oxycontin [Oxycodone Hcl] Nausea And Vomiting  . Penicillins     TOLERATES CEPHALOSPORINS Reaction: unknown Has patient had a PCN reaction causing immediate rash, facial/tongue/throat swelling, SOB or lightheadedness with hypotension: {unknown Has patient had a PCN reaction causing severe rash involving mucus membranes or skin necrosis: {unknown Has patient had a PCN reaction that required hospitalization {unknown Has patient had a PCN reaction occurring within the last 10 years: {no If all of the above answers are "NO", then may proceed with Cephalosporin use.  . Rosuvastatin     REACTION: MUSCLE ACHES  . Septra  [Sulfamethoxazole-Trimethoprim] Other (See Comments)    Rash and itching  . Venlafaxine Hives  . Voltaren [Diclofenac Sodium] Hives    oral  . Tramadol Diarrhea, Nausea And Vomiting and Palpitations    Medication list has been reviewed and updated.   General: Denies fever, chills, sweats. No significant weight loss. Eyes: Denies blurring,significant itching ENT: Denies earache, sore throat, and hoarseness.  Cardiovascular: Denies chest pains, palpitations, dyspnea on exertion,  Respiratory: Denies cough, dyspnea at rest,wheeezing Breast: no concerns about lumps GI: Denies nausea, vomiting, diarrhea, constipation, change in bowel habits, abdominal pain, melena, hematochezia GU: Denies dysuria, hematuria, urinary hesitancy, nocturia, denies STD risk, no concerns about discharge Musculoskeletal: Denies back pain, joint pain Derm: Denies rash, itching Neuro: Denies  paresthesias, frequent falls, frequent headaches Psych: Denies depression, anxiety Endocrine: Denies cold intolerance, heat intolerance, polydipsia Heme: Denies enlarged lymph nodes Allergy: No hayfever  Objective:   BP 122/76   Pulse 66   Temp 98.6 F (37 C) (Oral)   Ht 5' 5" (1.651 m)   Wt 222 lb 8 oz (100.9 kg)   BMI 37.03 kg/m  No exam data present  GEN: well developed, well nourished, no acute distress Eyes: conjunctiva and lids normal, PERRLA, EOMI ENT: TM clear, nares clear, oral exam WNL Neck: supple, no lymphadenopathy, no thyromegaly, no JVD Pulm: clear to auscultation and percussion, respiratory effort normal CV: regular rate and rhythm, S1-S2, no murmur, rub or gallop, no bruits Chest: no scars, masses, no lumps BREAST: breast exam normal. Chaperoned examination. Entirety of breast examined including axilla, and no enlarged lymph nodes. No significant masses are felt. No nipple discharge. No skin changes. Overall, reassuring breast exam.  GI: soft, non-tender; no hepatosplenomegaly, masses; active  bowel sounds all quadrants GU: GU exam declined Lymph: no cervical, axillary or inguinal adenopathy MSK: gait normal, muscle tone and strength WNL, no joint swelling, effusions, discoloration, crepitus  SKIN: clear, good turgor, color WNL, no rashes, lesions, or ulcerations Neuro: normal mental status, normal strength, sensation, and motion Psych: alert; oriented to person, place and time, normally interactive and not anxious or depressed in appearance.   All labs reviewed with patient. Lipids:    Component Value Date/Time   CHOL 176 01/10/2018 1202   TRIG 169.0 (H)  01/10/2018 1202   HDL 44.40 01/10/2018 1202   LDLDIRECT 113.0 04/03/2013 1307   VLDL 33.8 01/10/2018 1202   CHOLHDL 4 01/10/2018 1202   CBC: CBC Latest Ref Rng & Units 01/10/2018 01/02/2017 04/18/2016  WBC 4.0 - 10.5 K/uL 6.4 5.7 7.1  Hemoglobin 12.0 - 15.0 g/dL 13.0 11.6(L) 12.6  Hematocrit 36.0 - 46.0 % 37.6 34.1(L) 37.3  Platelets 150.0 - 400.0 K/uL 276.0 250.0 644.0    Basic Metabolic Panel:    Component Value Date/Time   NA 138 01/10/2018 1202   K 4.0 01/10/2018 1202   CL 99 01/10/2018 1202   CO2 33 (H) 01/10/2018 1202   BUN 13 01/10/2018 1202   CREATININE 0.87 01/10/2018 1202   GLUCOSE 188 (H) 01/10/2018 1202   CALCIUM 9.6 01/10/2018 1202   Hepatic Function Latest Ref Rng & Units 01/10/2018 01/02/2017 04/18/2016  Total Protein 6.0 - 8.3 g/dL 7.3 6.5 6.8  Albumin 3.5 - 5.2 g/dL 4.1 4.0 4.1  AST 0 - 37 U/L 29 48(H) 33  ALT 0 - 35 U/L 33 41(H) 36(H)  Alk Phosphatase 39 - 117 U/L 113 115 98  Total Bilirubin 0.2 - 1.2 mg/dL 0.3 0.4 0.3  Bilirubin, Direct 0.0 - 0.3 mg/dL 0.0 0.1 0.1    Lab Results  Component Value Date   TSH 4.20 01/10/2018   No results found.  Assessment and Plan:   Healthcare maintenance  Health Maintenance Exam: The patient's preventative maintenance and recommended screening tests for an annual wellness exam were reviewed in full today. Brought up to date unless services  declined.  Counselled on the importance of diet, exercise, and its role in overall health and mortality. The patient's FH and SH was reviewed, including their home life, tobacco status, and drug and alcohol status.  Follow-up in 1 year for physical exam or additional follow-up below.  Follow-up: No follow-ups on file. Or follow-up in 1 year if not noted.  Signed,  Maud Deed. Keileigh Vahey, MD   Allergies as of 01/17/2018      Reactions   Rizatriptan Benzoate Anaphylaxis   Throat and Tongue Swelling Closed   Sumatriptan Other (See Comments)   Throat closed & Tongue Swelling   Tizanidine Shortness Of Breath   Aleve [naproxen Sodium] Hives   Clarithromycin Hives   Cyclobenzaprine Hcl    REACTION: THROAT CLOSED,TOUNGE SWELLING   Doxycycline Diarrhea, Nausea And Vomiting   Reaction: unknown   Duloxetine Nausea Only   Keflex [cephalexin] Itching   Sores   Metronidazole Diarrhea, Nausea Only   Oxycodone Hcl Diarrhea   Vomiting   Oxycontin [oxycodone Hcl] Nausea And Vomiting   Penicillins    TOLERATES CEPHALOSPORINS Reaction: unknown Has patient had a PCN reaction causing immediate rash, facial/tongue/throat swelling, SOB or lightheadedness with hypotension: {unknown Has patient had a PCN reaction causing severe rash involving mucus membranes or skin necrosis: {unknown Has patient had a PCN reaction that required hospitalization {unknown Has patient had a PCN reaction occurring within the last 10 years: {no If all of the above answers are "NO", then may proceed with Cephalosporin use.   Rosuvastatin    REACTION: MUSCLE ACHES   Septra [sulfamethoxazole-trimethoprim] Other (See Comments)   Rash and itching   Venlafaxine Hives   Voltaren [diclofenac Sodium] Hives   oral   Tramadol Diarrhea, Nausea And Vomiting, Palpitations      Medication List        Accurate as of 01/17/18  3:02 PM. Always use your most recent med list.  B COMPLEX 100 PO Take 100 mg by mouth  daily.   bisacodyl 5 MG EC tablet Commonly known as:  DULCOLAX Take 5 mg by mouth daily as needed for moderate constipation. Take 1-2 at hs   CALCIUM 600 PO Take 600 mg by mouth 2 (two) times daily.   carboxymethylcellulose 0.5 % Soln Commonly known as:  REFRESH PLUS Place 1 drop into both eyes 3 (three) times daily as needed.   cetirizine 10 MG tablet Commonly known as:  ZYRTEC Take 10 mg by mouth daily.   citalopram 40 MG tablet Commonly known as:  CELEXA TAKE 1 TABLET BY MOUTH  DAILY   Coenzyme Q-10 100 MG capsule Take 100 mg by mouth daily.   diclofenac sodium 1 % Gel Commonly known as:  VOLTAREN Apply 4 g topically 4 (four) times daily as needed.   docusate sodium 100 MG capsule Commonly known as:  COLACE Take 400 mg by mouth at bedtime.   gabapentin 300 MG capsule Commonly known as:  NEURONTIN Take 1 capsule (300 mg total) by mouth 3 (three) times daily.   GRAPE SEED EXTRACT PO Take by mouth. 1 tablet daily   levothyroxine 50 MCG tablet Commonly known as:  SYNTHROID, LEVOTHROID TAKE 1 TABLET BY MOUTH  DAILY   MAGNESIUM GLYCINATE PLUS PO Take 400 mg by mouth at bedtime.   methocarbamol 500 MG tablet Commonly known as:  ROBAXIN TAKE ONE (1) TABLET BY MOUTH EVERY EIGHTHOURS AS NEEDED FOR MUSCLE SPASMS   mirtazapine 15 MG tablet Commonly known as:  REMERON TAKE 1 TABLET BY MOUTH AT  BEDTIME   morphine 15 MG 12 hr tablet Commonly known as:  MS CONTIN Take 1 tablet (15 mg total) by mouth 3 (three) times daily.   NON FORMULARY Daith piercing in right ear for headaches   oxybutynin 10 MG 24 hr tablet Commonly known as:  DITROPAN-XL TAKE ONE TABLET BY MOUTH EVERY NIGHT AT BEDTIME   RABEprazole 20 MG tablet Commonly known as:  ACIPHEX TAKE 1 TABLET BY MOUTH TWICE A DAY   ranitidine 150 MG capsule Commonly known as:  ZANTAC Take 150 mg by mouth 2 (two) times daily.   TURMERIC PO Take 800 mg by mouth daily.   Vitamin D 2000 units tablet Take  2,000 Units by mouth daily.

## 2018-01-21 DIAGNOSIS — J9610 Chronic respiratory failure, unspecified whether with hypoxia or hypercapnia: Secondary | ICD-10-CM | POA: Diagnosis not present

## 2018-01-22 ENCOUNTER — Encounter: Payer: Self-pay | Admitting: Family Medicine

## 2018-01-24 ENCOUNTER — Encounter: Payer: Self-pay | Admitting: Registered Nurse

## 2018-01-24 ENCOUNTER — Encounter: Payer: Medicare Other | Attending: Physical Medicine & Rehabilitation | Admitting: Registered Nurse

## 2018-01-24 VITALS — BP 139/72 | HR 67 | Resp 14 | Ht 66.0 in | Wt 222.0 lb

## 2018-01-24 DIAGNOSIS — Z79899 Other long term (current) drug therapy: Secondary | ICD-10-CM | POA: Diagnosis not present

## 2018-01-24 DIAGNOSIS — M5412 Radiculopathy, cervical region: Secondary | ICD-10-CM | POA: Diagnosis not present

## 2018-01-24 DIAGNOSIS — M961 Postlaminectomy syndrome, not elsewhere classified: Secondary | ICD-10-CM

## 2018-01-24 DIAGNOSIS — G894 Chronic pain syndrome: Secondary | ICD-10-CM | POA: Insufficient documentation

## 2018-01-24 DIAGNOSIS — M542 Cervicalgia: Secondary | ICD-10-CM | POA: Diagnosis not present

## 2018-01-24 DIAGNOSIS — M25561 Pain in right knee: Secondary | ICD-10-CM

## 2018-01-24 DIAGNOSIS — M25551 Pain in right hip: Secondary | ICD-10-CM

## 2018-01-24 DIAGNOSIS — Z5181 Encounter for therapeutic drug level monitoring: Secondary | ICD-10-CM | POA: Insufficient documentation

## 2018-01-24 DIAGNOSIS — G609 Hereditary and idiopathic neuropathy, unspecified: Secondary | ICD-10-CM | POA: Diagnosis not present

## 2018-01-24 DIAGNOSIS — M546 Pain in thoracic spine: Secondary | ICD-10-CM

## 2018-01-24 DIAGNOSIS — M25562 Pain in left knee: Secondary | ICD-10-CM

## 2018-01-24 DIAGNOSIS — Z79891 Long term (current) use of opiate analgesic: Secondary | ICD-10-CM

## 2018-01-24 DIAGNOSIS — M797 Fibromyalgia: Secondary | ICD-10-CM

## 2018-01-24 DIAGNOSIS — G8929 Other chronic pain: Secondary | ICD-10-CM

## 2018-01-24 MED ORDER — MORPHINE SULFATE ER 15 MG PO TBCR: 15.0000 mg | EXTENDED_RELEASE_TABLET | Freq: Three times a day (TID) | ORAL | 0 refills | Status: DC

## 2018-01-24 NOTE — Progress Notes (Signed)
Subjective:    Patient ID: [name], female    DOB: 1951/04/18, 67 y.o.   MRN: [id number]  HPI: [name] is a 68 year old female who returns for follow up appointment for chronic pain and medication refill. She states her pain is located in her neck radiating into her right shoulder, mid- back pain. Also reports increase frequency and intensity of right hip pain, we will order a X-ray, she verbalizes understanding. She rates her pain 5. Her current exercise regime is  Walking going to the [facility] three times a week, riding recumbent bicycle and treadmill for 15 minutes.   [name] Morphine Equivalent is 45.00 MME. Last UDS was Performed on 07/31/2017, it was consistent.     Pain Inventory Average Pain 6 Pain Right Now 5 My pain is constant, burning and aching  In the last 24 hours, has pain interfered with the following? General activity 6 Relation with others 6 Enjoyment of life 6 What TIME of day is your pain at its worst? evening, night  Sleep (in general) Fair  Pain is worse with: walking, bending, sitting, standing and some activites Pain improves with: rest, heat/ice, therapy/exercise, pacing activities, medication, TENS and injections Relief from Meds: 5  Mobility walk without assistance how many minutes can you walk? 15 ability to climb steps?  yes do you drive?  yes Do you have any goals in this area?  yes  Function not employed: date last employed . disabled: date disabled . I need assistance with the following:  meal prep, household duties and shopping Do you have any goals in this area?  yes  Neuro/Psych bladder control problems weakness numbness tremor tingling spasms depression  Prior Studies Any changes since last visit?  no  Physicians involved in your care Any changes since last visit?  no   Family History  Problem Relation Age of Onset  . Breast cancer Mother   . Diabetes Mother   . Heart disease Mother   . Allergies  Mother   . Diabetes Father   . Heart disease Father   . Emphysema Father   . Allergies Father   . Stomach cancer Maternal Uncle   . Breast cancer Paternal Aunt   . Colon cancer Neg Hx    Social History   Socioeconomic History  . Marital status: Married    Spouse name: Not on file  . Number of children: Not on file  . Years of education: Not on file  . Highest education level: Not on file  Occupational History  . Occupation: Medical laboratory scientific officer  . Financial resource strain: Not on file  . Food insecurity:    Worry: Not on file    Inability: Not on file  . Transportation needs:    Medical: Not on file    Non-medical: Not on file  Tobacco Use  . Smoking status: Never Smoker  . Smokeless tobacco: Never Used  Substance and Sexual Activity  . Alcohol use: Yes    Alcohol/week: 0.0 oz    Comment: rare  . Drug use: No  . Sexual activity: Not on file  Lifestyle  . Physical activity:    Days per week: Not on file    Minutes per session: Not on file  . Stress: Not on file  Relationships  . Social connections:    Talks on phone: Not on file    Gets together: Not on file    Attends religious service: Not on  file    Active member of club or organization: Not on file    Attends meetings of clubs or organizations: Not on file    Relationship status: Not on file  Other Topics Concern  . Not on file  Social History Narrative  . Not on file   Past Surgical History:  Procedure Laterality Date  . [place] STUDY N/A 01/04/2016   Procedure: [place] STUDY;  Surgeon: Manus Gunning, MD;  Location: [facility];  Service: Gastroenterology;  Laterality: N/A;  . ABDOMINAL HYSTERECTOMY  1988  . ADENOIDECTOMY    . ANTERIOR CERVICAL DECOMP/DISCECTOMY FUSION  2009, 1993   first procedure in ~ 1993/Dr Botero  . APPENDECTOMY  1988  . BACK SURGERY    . CHOLECYSTECTOMY  2005  . ESOPHAGEAL MANOMETRY N/A 01/04/2016   Procedure: ESOPHAGEAL MANOMETRY (EM) 24 HR PH;  Surgeon:  Manus Gunning, MD;  Location: [facility];  Service: Gastroenterology;  Laterality: N/A;  . ESOPHAGOGASTRODUODENOSCOPY (EGD) WITH PROPOFOL N/A 05/17/2016   Procedure: ESOPHAGOGASTRODUODENOSCOPY (EGD) WITH PROPOFOL;  Surgeon: Manus Gunning, MD;  Location: [facility];  Service: Gastroenterology;  Laterality: N/A;  . NASAL SINUS SURGERY  2006  . SAVORY DILATION N/A 05/17/2016   Procedure: SAVORY DILATION;  Surgeon: Manus Gunning, MD;  Location: [facility];  Service: Gastroenterology;  Laterality: N/A;  . TONSILLECTOMY AND ADENOIDECTOMY    . TRANSTHORACIC ECHOCARDIOGRAM  2009   Normal LV size and function. EF 60-65%. No regional wall motion and amount is. Mild aortic sclerosis, no stenosis  . TUBAL LIGATION  1979   Past Medical History:  Diagnosis Date  . Allergy   . Anemia   . Arthritis   . B12 deficiency   . Complication of anesthesia    slow to wake up   . Depression   . Diabetes mellitus without complication ([place])    type II - diet controlled ; A1C 6.5 01/2017  . Fibromyalgia   . Generalized hyperhidrosis 04/17/2014  . GERD (gastroesophageal reflux disease)   . Heart murmur    Aortic Sclerosis  . Hyperlipemia   . HYPOTHYROIDISM 05/29/2007  . Irritable bowel syndrome   . Migraines    hx of   . Oxygen deficiency    2 L at bedtime  . 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 PONV (postoperative nausea and vomiting)   . PVC (premature ventricular contraction)   . Type II or unspecified type diabetes mellitus with neurological manifestations, not stated as uncontrolled(250.60) 04/07/2013  . Unspecified essential hypertension    denies htn on 05/09/16   BP 139/72 (BP Location: Left Arm, Patient Position: Sitting, Cuff Size: Normal)   Pulse 67   Resp 14   Ht 5\' 6"  (1.676 m)   Wt 222 lb (100.7 kg)   SpO2 96%   BMI 35.83 kg/m   Opioid Risk Score:   Fall Risk Score:  `1  Depression screen PHQ 2/9  Depression screen  [facility] 2/9 01/10/2018 11/24/2017 03/27/2017 02/28/2017 01/25/2017 01/02/2017 10/26/2016  Decreased Interest 2 1 0 1 1 0 1  Down, Depressed, Hopeless 0 1 0 1 1 0 1  PHQ - 2 Score 2 2 0 2 2 0 2  Altered sleeping 0 - - - - - -  Tired, decreased energy 1 - - - - - -  Change in appetite 0 - - - - - -  Feeling bad or failure about yourself  0 - - - - - -  Trouble concentrating 1 - - - - - -  Moving slowly or fidgety/restless 0 - - - - - -  Suicidal thoughts 0 - - - - - -  PHQ-9 Score 4 - - - - - -  Difficult doing work/chores Not difficult at all Not difficult at all - - - - -  Some recent data might be hidden   ' Review of Systems  Constitutional: Negative.   HENT: Negative.   Eyes: Negative.   Respiratory: Negative.   Cardiovascular: Positive for leg swelling.  Gastrointestinal: Positive for constipation.  Genitourinary: Positive for difficulty urinating.  Musculoskeletal: Positive for arthralgias, back pain, neck pain and neck stiffness.       Spasms   Skin: Negative.   Allergic/Immunologic: Negative.   Neurological: Positive for tremors, weakness and numbness.       Tingling  Hematological: Negative.   Psychiatric/Behavioral: Negative.        Objective:   Physical Exam  Constitutional: She is oriented to person, place, and time. She appears well-developed and well-nourished.  HENT:  Head: Normocephalic and atraumatic.  Neck: Normal range of motion. Neck supple.  Cardiovascular: Normal rate and regular rhythm.  Pulmonary/Chest: Effort normal and breath sounds normal.  Musculoskeletal:  Normal Muscle Bulk and Muscle Testing Reveals: Upper Extremities: Full ROM and Muscle Strength 5/5 Back without spinal tenderness noted Right Greater Trochanter Tenderness Lower Extremities: Full ROM and Muscle Strength 5/5 Arises from Table with ease Narrow Based gait  Neurological: She is alert and oriented to person, place, and time.  Skin: Skin is warm and dry.  Psychiatric: She has a normal  mood and affect. Her behavior is normal.  Nursing note and vitals reviewed.         Assessment & Plan:  1. Cervical postlaminectomy syndrome: with neck and shoulder pain. 01/24/2018. Continue exercise routine and using heat therapy. Continue current medication regimen withMS Contin 15 mg one tablet three times a day #90.  We will continue the opioid monitoring program, this consists of regular clinic visits, examinations, urine drug screen, pill counts as well as use of [place] Controlled Substance reporting System. 2. Fibromyalgia/ Neuropathy: Continuecurrent medication regimen withGabapentin, activity and exercise regime. 01/24/2018. 3. Bilateral Knee Pain/ Mild Degenerative Changes:.Continuecurrent medication regimen withVoltaren Gel.01/24/2018 4. Right Ankle Pain: No Complaints Today. Continue HEP.01/24/2018 5. Muscle Spasm: Continuecurrent medication regimen withRobaxin. 01/24/2018 6. Opioid Induced Constipation: No complaints Continue to Monitor. 01/24/2018. 7. Midline Low Back Pain: Continue current medication regime, continue HEP as tolerated.01/24/2018 8. Right Gluteal Medius Weakness and Atrophy: No complaints Today:Continue with Hip Abduction Exercises: Continue to Monitor. 01/24/2018. 9. Right Hip Pain: RX: X-ray.   20 minutes of face to face patient care time was spent during this visit. All questions were encouraged and answered.   F/U in 1 month

## 2018-02-14 ENCOUNTER — Other Ambulatory Visit: Payer: Self-pay | Admitting: Podiatry

## 2018-02-14 MED ORDER — CLINDAMYCIN HCL 150 MG PO CAPS: 150.0000 mg | ORAL_CAPSULE | Freq: Three times a day (TID) | ORAL | 0 refills | Status: DC

## 2018-02-14 MED ORDER — ONDANSETRON HCL 4 MG PO TABS: 4.0000 mg | ORAL_TABLET | Freq: Three times a day (TID) | ORAL | 0 refills | Status: DC | PRN

## 2018-02-15 ENCOUNTER — Encounter: Payer: Medicare Other | Attending: Physical Medicine & Rehabilitation | Admitting: Registered Nurse

## 2018-02-15 ENCOUNTER — Encounter: Payer: Self-pay | Admitting: Registered Nurse

## 2018-02-15 VITALS — BP 121/72 | HR 62 | Ht 66.0 in | Wt 222.6 lb

## 2018-02-15 DIAGNOSIS — G894 Chronic pain syndrome: Secondary | ICD-10-CM | POA: Diagnosis not present

## 2018-02-15 DIAGNOSIS — Z5181 Encounter for therapeutic drug level monitoring: Secondary | ICD-10-CM | POA: Diagnosis not present

## 2018-02-15 DIAGNOSIS — M542 Cervicalgia: Secondary | ICD-10-CM

## 2018-02-15 DIAGNOSIS — M546 Pain in thoracic spine: Secondary | ICD-10-CM | POA: Diagnosis not present

## 2018-02-15 DIAGNOSIS — G609 Hereditary and idiopathic neuropathy, unspecified: Secondary | ICD-10-CM | POA: Diagnosis not present

## 2018-02-15 DIAGNOSIS — M25561 Pain in right knee: Secondary | ICD-10-CM

## 2018-02-15 DIAGNOSIS — Z79891 Long term (current) use of opiate analgesic: Secondary | ICD-10-CM

## 2018-02-15 DIAGNOSIS — M25562 Pain in left knee: Secondary | ICD-10-CM

## 2018-02-15 DIAGNOSIS — M797 Fibromyalgia: Secondary | ICD-10-CM

## 2018-02-15 DIAGNOSIS — M961 Postlaminectomy syndrome, not elsewhere classified: Secondary | ICD-10-CM | POA: Diagnosis not present

## 2018-02-15 DIAGNOSIS — Z79899 Other long term (current) drug therapy: Secondary | ICD-10-CM | POA: Diagnosis not present

## 2018-02-15 DIAGNOSIS — G8929 Other chronic pain: Secondary | ICD-10-CM

## 2018-02-15 MED ORDER — MORPHINE SULFATE ER 15 MG PO TBCR: 15.0000 mg | EXTENDED_RELEASE_TABLET | Freq: Three times a day (TID) | ORAL | 0 refills | Status: DC

## 2018-02-15 NOTE — Progress Notes (Signed)
Subjective:    Patient ID: [name], female    DOB: [birth year], 67 y.o.   MRN: [id number]  HPI: [name] is a 67 year old female who returns for follow up appointment for chronic pain and medication refill. She states her pain is located in her mid- back and bilateral knees. She rates her pain 4. Her current exercise regime is walking and attending the [facility] 2-3 days a week.   [name] Morphine Equivalent is 45.00 MME. Last UDS was Performed on 07/31/2017, it was consistent.   Pain Inventory Average Pain 5 Pain Right Now 4 My pain is constant, burning and aching  In the last 24 hours, has pain interfered with the following? General activity 5 Relation with others 5 Enjoyment of life 5 What TIME of day is your pain at its worst? night Sleep (in general) Fair  Pain is worse with: walking, bending, standing and some activites Pain improves with: rest, heat/ice, therapy/exercise, pacing activities, medication, TENS and injections Relief from Meds: 5  Mobility walk without assistance ability to climb steps?  yes do you drive?  yes  Function disabled: date disabled 2010 retired  Neuro/Psych bladder control problems weakness numbness tremor tingling depression  Prior Studies Any changes since last visit?  no  Physicians involved in your care Any changes since last visit?  no   Family History  Problem Relation Age of Onset  . Breast cancer Mother   . Diabetes Mother   . Heart disease Mother   . Allergies Mother   . Diabetes Father   . Heart disease Father   . Emphysema Father   . Allergies Father   . Stomach cancer Maternal Uncle   . Breast cancer Paternal Aunt   . Colon cancer Neg Hx    Social History   Socioeconomic History  . Marital status: Married    Spouse name: Not on file  . Number of children: Not on file  . Years of education: Not on file  . Highest education level: Not on file  Occupational History  . Occupation: Retail buyer  . Financial resource strain: Not on file  . Food insecurity:    Worry: Not on file    Inability: Not on file  . Transportation needs:    Medical: Not on file    Non-medical: Not on file  Tobacco Use  . Smoking status: Never Smoker  . Smokeless tobacco: Never Used  Substance and Sexual Activity  . Alcohol use: Yes    Alcohol/week: 0.0 standard drinks    Comment: rare  . Drug use: No  . Sexual activity: Not on file  Lifestyle  . Physical activity:    Days per week: Not on file    Minutes per session: Not on file  . Stress: Not on file  Relationships  . Social connections:    Talks on phone: Not on file    Gets together: Not on file    Attends religious service: Not on file    Active member of club or organization: Not on file    Attends meetings of clubs or organizations: Not on file    Relationship status: Not on file  Other Topics Concern  . Not on file  Social History Narrative  . Not on file   Past Surgical History:  Procedure Laterality Date  . [place] STUDY N/A 01/04/2016   Procedure: [place] STUDY;  Surgeon: Manus Gunning,  MD;  Location: [facility];  Service: Gastroenterology;  Laterality: N/A;  . ABDOMINAL HYSTERECTOMY  1988  . ADENOIDECTOMY    . ANTERIOR CERVICAL DECOMP/DISCECTOMY FUSION  2009, 1993   first procedure in ~ 1993/Dr Botero  . APPENDECTOMY  1988  . BACK SURGERY    . CHOLECYSTECTOMY  2005  . ESOPHAGEAL MANOMETRY N/A 01/04/2016   Procedure: ESOPHAGEAL MANOMETRY (EM) 24 HR PH;  Surgeon: Manus Gunning, MD;  Location: [facility];  Service: Gastroenterology;  Laterality: N/A;  . ESOPHAGOGASTRODUODENOSCOPY (EGD) WITH PROPOFOL N/A 05/17/2016   Procedure: ESOPHAGOGASTRODUODENOSCOPY (EGD) WITH PROPOFOL;  Surgeon: Manus Gunning, MD;  Location: [facility];  Service: Gastroenterology;  Laterality: N/A;  . NASAL SINUS SURGERY  2006  . SAVORY DILATION N/A 05/17/2016   Procedure: SAVORY DILATION;  Surgeon:  Manus Gunning, MD;  Location: [facility];  Service: Gastroenterology;  Laterality: N/A;  . TONSILLECTOMY AND ADENOIDECTOMY    . TRANSTHORACIC ECHOCARDIOGRAM  2009   Normal LV size and function. EF 60-65%. No regional wall motion and amount is. Mild aortic sclerosis, no stenosis  . TUBAL LIGATION  1979   Past Medical History:  Diagnosis Date  . Allergy   . Anemia   . Arthritis   . B12 deficiency   . Complication of anesthesia    slow to wake up   . Depression   . Diabetes mellitus without complication ([place])    type II - diet controlled ; A1C 6.5 01/2017  . Fibromyalgia   . Generalized hyperhidrosis 04/17/2014  . GERD (gastroesophageal reflux disease)   . Heart murmur    Aortic Sclerosis  . Hyperlipemia   . HYPOTHYROIDISM 05/29/2007  . Irritable bowel syndrome   . Migraines    hx of   . Oxygen deficiency    2 L at bedtime  . 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 PONV (postoperative nausea and vomiting)   . PVC (premature ventricular contraction)   . Type II or unspecified type diabetes mellitus with neurological manifestations, not stated as uncontrolled(250.60) 04/07/2013  . Unspecified essential hypertension    denies htn on 05/09/16   BP 121/72   Pulse 62   Ht 5\' 6"  (1.676 m)   Wt 222 lb 9.6 oz (101 kg)   SpO2 97%   BMI 35.93 kg/m   Opioid Risk Score:   Fall Risk Score:  `1  Depression screen PHQ 2/9  Depression screen [facility] 2/9 01/10/2018 11/24/2017 03/27/2017 02/28/2017 01/25/2017 01/02/2017 10/26/2016  Decreased Interest 2 1 0 1 1 0 1  Down, Depressed, Hopeless 0 1 0 1 1 0 1  PHQ - 2 Score 2 2 0 2 2 0 2  Altered sleeping 0 - - - - - -  Tired, decreased energy 1 - - - - - -  Change in appetite 0 - - - - - -  Feeling bad or failure about yourself  0 - - - - - -  Trouble concentrating 1 - - - - - -  Moving slowly or fidgety/restless 0 - - - - - -  Suicidal thoughts 0 - - - - - -  PHQ-9 Score 4 - - - - - -  Difficult doing  work/chores Not difficult at all Not difficult at all - - - - -  Some recent data might be hidden     Review of Systems  Constitutional: Negative.   HENT: Negative.   Eyes: Negative.  Respiratory: Negative.   Cardiovascular: Negative.   Gastrointestinal: Positive for constipation.  Endocrine: Negative.   Genitourinary: Positive for difficulty urinating.  Musculoskeletal: Positive for arthralgias, back pain and myalgias.  Skin: Negative.   Allergic/Immunologic: Negative.   Neurological: Positive for tremors, weakness and numbness.  Hematological: Negative.   Psychiatric/Behavioral: Positive for dysphoric mood.  All other systems reviewed and are negative.      Objective:   Physical Exam  Constitutional: She is oriented to person, place, and time. She appears well-developed and well-nourished.  HENT:  Head: Normocephalic and atraumatic.  Neck: Normal range of motion. Neck supple.  Cardiovascular: Normal rate and regular rhythm.  Pulmonary/Chest: Effort normal and breath sounds normal.  Musculoskeletal:  Normal Muscle Bulk and Muscle Testing Reveals: Upper Extremities: Full ROM and Muscle Strength 5/5 Thoracic Paraspinal Tenderness: T-7-T-9 Lower Extremities: Full ROM and Muscle Strength 5/5 Lower Extremities with 2+ Pitting Edema Arises from Table with Ease Narrow Based Gait  Neurological: She is alert and oriented to person, place, and time.  Skin: Skin is warm and dry.  Psychiatric: She has a normal mood and affect. Her behavior is normal.  Nursing note and vitals reviewed.         Assessment & Plan:  1. Cervical postlaminectomy syndrome: with neck and shoulder pain. 02/15/2018. Continue exercise routine and using heat therapy. Continue current medication regimen withMS Contin 15 mg one tablet three times a day #90.  We will continue the opioid monitoring program, this consists of regular clinic visits, examinations, urine drug screen, pill counts as well as  use of [place] Controlled Substance reporting System. 2. Fibromyalgia/ Neuropathy: Continuecurrent medication regimen withGabapentin, activity and exercise regime. 02/15/2018. 3. Bilateral Knee Pain/ Mild Degenerative Changes:.Continuecurrent medication regimen withVoltaren Gel.02/15/2018 4. Right Ankle Pain: No Complaints Today. Continue HEP.02/15/2018 5. Muscle Spasm: Continuecurrent medication regimen withRobaxin. 02/15/2018 6. Opioid Induced Constipation: No complaints Continue to Monitor. 02/15/2018. 7. Midline Low Back Pain: Continue current medication regime, continue HEP as tolerated.02/15/2018 8. Right Gluteal Medius Weakness and Atrophy: No complaints Today:Continue with Hip Abduction Exercises: Continue to Monitor. 02/15/2018.  20 minutes of face to face patient care time was spent during this visit. All questions were encouraged and answered.   F/U in 1 month

## 2018-02-16 DIAGNOSIS — M21612 Bunion of left foot: Secondary | ICD-10-CM | POA: Diagnosis not present

## 2018-02-16 DIAGNOSIS — M2012 Hallux valgus (acquired), left foot: Secondary | ICD-10-CM | POA: Diagnosis not present

## 2018-02-16 DIAGNOSIS — M21542 Acquired clubfoot, left foot: Secondary | ICD-10-CM | POA: Diagnosis not present

## 2018-02-16 DIAGNOSIS — M25572 Pain in left ankle and joints of left foot: Secondary | ICD-10-CM | POA: Diagnosis not present

## 2018-02-16 DIAGNOSIS — K219 Gastro-esophageal reflux disease without esophagitis: Secondary | ICD-10-CM | POA: Diagnosis not present

## 2018-02-16 DIAGNOSIS — M216X2 Other acquired deformities of left foot: Secondary | ICD-10-CM | POA: Diagnosis not present

## 2018-02-19 ENCOUNTER — Telehealth: Payer: Self-pay | Admitting: Podiatry

## 2018-02-19 NOTE — Telephone Encounter (Signed)
I spoke with patient, she informed me that the top of her foot is still very numb, but the bottom of her foot is very painful.  She stated that she is taking Ibuprofen and Tylenol as directed along with her pain medication from pain management.  She has not been using ice.   I informed her that she can loosen the ace wrap on top bandage, but do not take off.  I instructed her to keep elevated, use ice and continue medication as directed.  If any further problems, she will call back.  She verbalized understanding

## 2018-02-19 NOTE — Telephone Encounter (Signed)
Dr. Milinda Pointer performed bunion surgery on my left foot on Friday, 23 August. I'm experiencing a great deal of pain on the bottom of my foot. I've got a nerve block and the top part of my foot still feels completley numb. However, the bottom right in the middle is really really bad pain. It's keeping me awake, I didn't sleep any well last night, and it makes walking extremely difficult. I just wanted to see if this is normal. I'm not asking for pain medicine, I just wanted to know if this is normal or if there is something else going on. My number is [phone]. Thank you.

## 2018-02-21 ENCOUNTER — Ambulatory Visit: Payer: Medicare Other | Admitting: Registered Nurse

## 2018-02-21 ENCOUNTER — Ambulatory Visit (INDEPENDENT_AMBULATORY_CARE_PROVIDER_SITE_OTHER): Payer: Medicare Other | Admitting: Podiatry

## 2018-02-21 ENCOUNTER — Encounter: Payer: Self-pay | Admitting: Podiatry

## 2018-02-21 ENCOUNTER — Ambulatory Visit (INDEPENDENT_AMBULATORY_CARE_PROVIDER_SITE_OTHER): Payer: Medicare Other

## 2018-02-21 DIAGNOSIS — M216X2 Other acquired deformities of left foot: Secondary | ICD-10-CM

## 2018-02-21 DIAGNOSIS — Z9889 Other specified postprocedural states: Secondary | ICD-10-CM

## 2018-02-21 DIAGNOSIS — J9610 Chronic respiratory failure, unspecified whether with hypoxia or hypercapnia: Secondary | ICD-10-CM | POA: Diagnosis not present

## 2018-02-21 DIAGNOSIS — M2012 Hallux valgus (acquired), left foot: Secondary | ICD-10-CM | POA: Diagnosis not present

## 2018-02-21 NOTE — Progress Notes (Signed)
She presents today 1 week status post [facility] bunion repair left second metatarsal osteotomy left.  Denies fever chills nausea vomiting muscle aches and pains.  Date of surgery 02/16/2018.  She states that the leg has been rubbed Rall in the lateral aspect from the boot.  And she has had a lot of bleeding through the dressing.  She denies shortness of breath chest pain.  Objective: Vital signs are stable alert and oriented x3.  Pulses are palpable.  Neurologic sensorium is intact.  Degenerative flexors are intact.  Muscle strength was 5/5 dorsiflexors plantar flexors inverters everters onto the musculature is intact.  Dry sterile dressing was removed demonstrates considerable amount of bleeding.  She has bruising and blisters to the dorsal aspect of the foot and plantar aspect of the forefoot.  There is a raw bleeding area on the lateral aspect of the left leg.  None of this seems to be clinically infected I lanced the blister today draining the fluid.  I redressed today with dry sterile compressive dressing.  Assessment: 1 week status post [place] bunionectomy and second metatarsal osteotomy with some edema complications.  Plan: Redressed today dressed a compressive dressing put her in a Darco shoe as not it breakdown the skin laterally.  I will follow-up with her in 1 week should she need me before then she will notify us immediately.

## 2018-02-25 ENCOUNTER — Other Ambulatory Visit: Payer: Self-pay | Admitting: Family Medicine

## 2018-02-27 ENCOUNTER — Ambulatory Visit
Admission: RE | Admit: 2018-02-27 | Discharge: 2018-02-27 | Disposition: A | Discharge status: Home / Self Care | Payer: Medicare Other | Source: Ambulatory Visit | Attending: Family Medicine | Admitting: Family Medicine

## 2018-02-27 DIAGNOSIS — M810 Age-related osteoporosis without current pathological fracture: Principal | ICD-10-CM

## 2018-02-27 DIAGNOSIS — Z780 Asymptomatic menopausal state: Secondary | ICD-10-CM

## 2018-02-27 DIAGNOSIS — M8580 Other specified disorders of bone density and structure, unspecified site: Secondary | ICD-10-CM

## 2018-02-28 ENCOUNTER — Encounter: Payer: Self-pay | Admitting: Podiatry

## 2018-02-28 ENCOUNTER — Ambulatory Visit (INDEPENDENT_AMBULATORY_CARE_PROVIDER_SITE_OTHER): Payer: Medicare Other | Admitting: Podiatry

## 2018-02-28 DIAGNOSIS — M2012 Hallux valgus (acquired), left foot: Secondary | ICD-10-CM

## 2018-02-28 DIAGNOSIS — Z9889 Other specified postprocedural states: Secondary | ICD-10-CM

## 2018-02-28 DIAGNOSIS — M216X2 Other acquired deformities of left foot: Secondary | ICD-10-CM

## 2018-02-28 NOTE — Progress Notes (Signed)
She presents today for her second postop visit date of surgery 02/16/2018 status post [facility] bunionectomy left second metatarsal osteotomy left.  She states that she is doing quite well got her bandage wet Saturday and she is been dressing it every day after washing it with sterile saline.  She denies fever chills nausea vomiting muscle aches and pains states that is starting to feel better and look better.  Objective: Vital signs are stable she is alert and oriented x3 left foot demonstrates considerable erythema ecchymosis and multiple bullae which she did last visit.  This seems to be starting to demarcate at this point and the bullae are much smaller.  There is no erythema as an cellulitic type process and I did lance the smaller bullae today.  There are no larger bullae left.  Assessment well-healing surgical foot that we are running into some complications with the edema and the bulla that had developed.  Plan: Lanced today dressed a compressive dressing Betadine and Silvadene were used.  Continues a Darco shoe follow-up with her in 1 week we will follow-up with her weekly until satisfied that she is doing well.

## 2018-03-05 ENCOUNTER — Ambulatory Visit (INDEPENDENT_AMBULATORY_CARE_PROVIDER_SITE_OTHER)
Admission: RE | Admit: 2018-03-05 | Discharge: 2018-03-05 | Disposition: A | Discharge status: Home / Self Care | Payer: Medicare Other | Source: Ambulatory Visit | Attending: Family Medicine | Admitting: Family Medicine

## 2018-03-05 DIAGNOSIS — M9903 Segmental and somatic dysfunction of lumbar region: Secondary | ICD-10-CM | POA: Diagnosis not present

## 2018-03-05 DIAGNOSIS — M9901 Segmental and somatic dysfunction of cervical region: Secondary | ICD-10-CM | POA: Diagnosis not present

## 2018-03-05 DIAGNOSIS — M9902 Segmental and somatic dysfunction of thoracic region: Secondary | ICD-10-CM | POA: Diagnosis not present

## 2018-03-05 DIAGNOSIS — M810 Age-related osteoporosis without current pathological fracture: Secondary | ICD-10-CM

## 2018-03-05 DIAGNOSIS — M5134 Other intervertebral disc degeneration, thoracic region: Secondary | ICD-10-CM | POA: Diagnosis not present

## 2018-03-05 DIAGNOSIS — M5136 Other intervertebral disc degeneration, lumbar region: Secondary | ICD-10-CM | POA: Diagnosis not present

## 2018-03-06 DIAGNOSIS — M9903 Segmental and somatic dysfunction of lumbar region: Secondary | ICD-10-CM | POA: Diagnosis not present

## 2018-03-06 DIAGNOSIS — M9901 Segmental and somatic dysfunction of cervical region: Secondary | ICD-10-CM | POA: Diagnosis not present

## 2018-03-06 DIAGNOSIS — M9902 Segmental and somatic dysfunction of thoracic region: Secondary | ICD-10-CM | POA: Diagnosis not present

## 2018-03-06 DIAGNOSIS — M5134 Other intervertebral disc degeneration, thoracic region: Secondary | ICD-10-CM | POA: Diagnosis not present

## 2018-03-06 DIAGNOSIS — M5136 Other intervertebral disc degeneration, lumbar region: Secondary | ICD-10-CM | POA: Diagnosis not present

## 2018-03-07 ENCOUNTER — Encounter: Payer: Self-pay | Admitting: Podiatry

## 2018-03-07 ENCOUNTER — Ambulatory Visit (INDEPENDENT_AMBULATORY_CARE_PROVIDER_SITE_OTHER): Payer: Medicare Other | Admitting: Podiatry

## 2018-03-07 ENCOUNTER — Ambulatory Visit (INDEPENDENT_AMBULATORY_CARE_PROVIDER_SITE_OTHER): Payer: Medicare Other

## 2018-03-07 DIAGNOSIS — M2012 Hallux valgus (acquired), left foot: Secondary | ICD-10-CM

## 2018-03-07 DIAGNOSIS — M216X2 Other acquired deformities of left foot: Secondary | ICD-10-CM

## 2018-03-07 DIAGNOSIS — Z9889 Other specified postprocedural states: Secondary | ICD-10-CM

## 2018-03-07 MED ORDER — SILVER SULFADIAZINE 1 % EX CREA: 1.0000 "application " | TOPICAL_CREAM | Freq: Every day | CUTANEOUS | 1 refills | Status: DC

## 2018-03-07 NOTE — Progress Notes (Signed)
She presents today for follow-up of her surgical foot date of surgery February 16, 2018 status post bunionectomy and second metatarsal osteotomy she states that is doing better she refers to the blistering in the dehiscence of the surgical site.  She states that she changes the dressing every day.  Objective: Foot looks much better there appears to be still some serosanguineous drainage but some epithelialization is occurring.  Demarcation of the necrotic areas has started.  I do not see any signs of infection and it does not smell infected.  Assessment: Well-healing surgical foot radiographs demonstrate well-healing surgical foot and internal fixation is still in good position.  Plan: Silvadene cream was ordered today for her dressing changes.  However had placed Silvadene cream under occlusion and dressed with a dry sterile compressive dressing.  Follow-up with her in 1 week or so.

## 2018-03-08 DIAGNOSIS — M9901 Segmental and somatic dysfunction of cervical region: Secondary | ICD-10-CM | POA: Diagnosis not present

## 2018-03-08 DIAGNOSIS — M5134 Other intervertebral disc degeneration, thoracic region: Secondary | ICD-10-CM | POA: Diagnosis not present

## 2018-03-08 DIAGNOSIS — M9902 Segmental and somatic dysfunction of thoracic region: Secondary | ICD-10-CM | POA: Diagnosis not present

## 2018-03-08 DIAGNOSIS — M9903 Segmental and somatic dysfunction of lumbar region: Secondary | ICD-10-CM | POA: Diagnosis not present

## 2018-03-08 DIAGNOSIS — M5136 Other intervertebral disc degeneration, lumbar region: Secondary | ICD-10-CM | POA: Diagnosis not present

## 2018-03-11 ENCOUNTER — Encounter: Payer: Self-pay | Admitting: Podiatry

## 2018-03-12 ENCOUNTER — Telehealth: Payer: Self-pay | Admitting: Podiatry

## 2018-03-12 ENCOUNTER — Ambulatory Visit (INDEPENDENT_AMBULATORY_CARE_PROVIDER_SITE_OTHER): Payer: Medicare Other | Admitting: Podiatry

## 2018-03-12 ENCOUNTER — Encounter: Payer: Self-pay | Admitting: Podiatry

## 2018-03-12 DIAGNOSIS — M5134 Other intervertebral disc degeneration, thoracic region: Secondary | ICD-10-CM | POA: Diagnosis not present

## 2018-03-12 DIAGNOSIS — M2012 Hallux valgus (acquired), left foot: Secondary | ICD-10-CM

## 2018-03-12 DIAGNOSIS — M9901 Segmental and somatic dysfunction of cervical region: Secondary | ICD-10-CM | POA: Diagnosis not present

## 2018-03-12 DIAGNOSIS — T8131XA Disruption of external operation (surgical) wound, not elsewhere classified, initial encounter: Secondary | ICD-10-CM

## 2018-03-12 DIAGNOSIS — M5136 Other intervertebral disc degeneration, lumbar region: Secondary | ICD-10-CM | POA: Diagnosis not present

## 2018-03-12 DIAGNOSIS — M9903 Segmental and somatic dysfunction of lumbar region: Secondary | ICD-10-CM | POA: Diagnosis not present

## 2018-03-12 DIAGNOSIS — M9902 Segmental and somatic dysfunction of thoracic region: Secondary | ICD-10-CM | POA: Diagnosis not present

## 2018-03-12 MED ORDER — CLINDAMYCIN HCL 150 MG PO CAPS: 150.0000 mg | ORAL_CAPSULE | Freq: Three times a day (TID) | ORAL | 1 refills | Status: DC

## 2018-03-12 NOTE — Progress Notes (Signed)
She presents today date of surgery 02/16/2018 status post [facility] bunion repair and a tarsal osteotomy second left.  She states that the wound had opened up last night and it really made her nervous so she wanted me to take a look at it today.  She states that it does not hurt she denies fever chills nausea vomiting states that she is just recently run out of her antibiotics.  Objective: Vital signs are stable alert and oriented x3.  Pulses are palpable.  The swelling in her foot has subsided considerably.  Much of the area that was sloughing has now turned to eschar and is peeling off with visibly new healthy skin beneath it.  However the incision overlying the bunion has dehisced with small pieces of 5-0 Monocryl visible which I removed.  The tissue is easily believable and the medial aspect of the dehiscence appears to be granulating nicely however the interspace does demonstrate a raised area which appears to be probable deep in the interspace but actually is not probable.  I feel that this is the area of the hematoma which resulted in all of the blistering and the swelling in the forefoot.  I feel that this is where it originated I see no signs of infection there is no purulence there is no malodor there is eschar macerated tissue.  I removed the remaining suture overlying the second metatarsal knowing full well that this very well may dehisce but there is an eschar overlying so hopefully it will not.  Again once I removed those sutures it did not open up and there was no purulence no malodor no signs of infection.  Assessment dehisced wound status post hematoma status post [place] bunion repair second metatarsal osteotomy date of surgery February 16, 2018.  Plan: I debrided the wound today for her placed Silvadene cream dry sterile compressive dressing encouraged her to continue to do so daily and also I would like for her to get started back on her antibiotics.  I would like for her to see Dr. Amalia Hailey to see  if he feels that this may be a candidate for a wound VAC.  Otherwise I will follow-up with her next week.  We refilled her antibiotics today.

## 2018-03-12 NOTE — Telephone Encounter (Signed)
I had surgery with Dr. Milinda Pointer on 23 August. The incision has burst open from where the stitches that automatically disintegrate were. I don't think it looks like its supposed to. I sent a picture on MyChart and I just wanted to make sure someone can look at that and get back with me and let me know if I need to come in before my appointment on Wednesday or what I need to do. It doesn't hurt, it just looks like it ripped open. You can reach me back at [phone] and if you don't reach me there, you can call [phone]. Thank you.

## 2018-03-12 NOTE — Telephone Encounter (Signed)
I returned patient call.  She reported that her incision has come open where the internal stitches were and has had some bleeding.  I spoke with Dr. Milinda Pointer and we reviewed the picture sent through my-chart.  He requested for her to come in for evaluation.  She was sent to scheduling and will be here this afternoon.

## 2018-03-14 ENCOUNTER — Encounter: Payer: Medicare Other | Admitting: Podiatry

## 2018-03-15 DIAGNOSIS — M5136 Other intervertebral disc degeneration, lumbar region: Secondary | ICD-10-CM | POA: Diagnosis not present

## 2018-03-15 DIAGNOSIS — M9903 Segmental and somatic dysfunction of lumbar region: Secondary | ICD-10-CM | POA: Diagnosis not present

## 2018-03-15 DIAGNOSIS — M9902 Segmental and somatic dysfunction of thoracic region: Secondary | ICD-10-CM | POA: Diagnosis not present

## 2018-03-15 DIAGNOSIS — M9901 Segmental and somatic dysfunction of cervical region: Secondary | ICD-10-CM | POA: Diagnosis not present

## 2018-03-15 DIAGNOSIS — M5134 Other intervertebral disc degeneration, thoracic region: Secondary | ICD-10-CM | POA: Diagnosis not present

## 2018-03-16 ENCOUNTER — Ambulatory Visit: Payer: Medicare Other

## 2018-03-16 ENCOUNTER — Encounter: Payer: Self-pay | Admitting: Podiatry

## 2018-03-16 ENCOUNTER — Ambulatory Visit (INDEPENDENT_AMBULATORY_CARE_PROVIDER_SITE_OTHER): Payer: Medicare Other | Admitting: Podiatry

## 2018-03-16 DIAGNOSIS — L97522 Non-pressure chronic ulcer of other part of left foot with fat layer exposed: Secondary | ICD-10-CM

## 2018-03-16 DIAGNOSIS — T8131XD Disruption of external operation (surgical) wound, not elsewhere classified, subsequent encounter: Secondary | ICD-10-CM

## 2018-03-16 DIAGNOSIS — I70245 Atherosclerosis of native arteries of left leg with ulceration of other part of foot: Secondary | ICD-10-CM | POA: Diagnosis not present

## 2018-03-16 DIAGNOSIS — M2012 Hallux valgus (acquired), left foot: Secondary | ICD-10-CM

## 2018-03-16 DIAGNOSIS — E0843 Diabetes mellitus due to underlying condition with diabetic autonomic (poly)neuropathy: Secondary | ICD-10-CM

## 2018-03-18 NOTE — Progress Notes (Signed)
Subjective:  Patient DM type II presents today status post bunionectomy of the left foot. DOS: 02/16/2018.  Patient developed a postsurgical wound dehiscence with likely microvascular ischemia to the area.  Patient presents today for second opinion regarding wound evaluation.  Patient is very pleasant and does not experience any significant pain regarding the surgical extremity.  She believes that the wound is somewhat unchanged since last visit on 03/12/2018.  She presents for further treatment evaluation  Past Medical History:  Diagnosis Date  . Allergy   . Anemia   . Arthritis   . B12 deficiency   . Complication of anesthesia    slow to wake up   . Depression   . Diabetes mellitus without complication ([place])    type II - diet controlled ; A1C 6.5 01/2017  . Fibromyalgia   . Generalized hyperhidrosis 04/17/2014  . GERD (gastroesophageal reflux disease)   . Heart murmur    Aortic Sclerosis  . Hyperlipemia   . HYPOTHYROIDISM 05/29/2007  . Irritable bowel syndrome   . Migraines    hx of   . Oxygen deficiency    2 L at bedtime  . 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 PONV (postoperative nausea and vomiting)   . PVC (premature ventricular contraction)   . Type II or unspecified type diabetes mellitus with neurological manifestations, not stated as uncontrolled(250.60) 04/07/2013  . Unspecified essential hypertension    denies htn on 05/09/16       Objective/Physical Exam Neurovascular status intact.  Pedal pulses palpable.  Neurological sensation intact.  Significant amount of dehiscence of the Austin bunionectomy site measuring approximately 4.0 x 1.0 x 0.6 cm (LxWxD).  See clinical image.  To the surgical wound dehiscence there is some stable eschar noted around the area likely consistent with a microvascular ischemia.  No malodor noted.  Moderate amount of serosanguineous drainage noted.  There is no exposed bone, joint, or hardware noted.  There is  some periwound localized erythema localized to the surgical forefoot. Incision site overlying the second MTPJ left foot appears to have a dry stable eschar overlying the incision site.  No drainage noted.  No malodor noted.  Assessment: 1. s/p Austin bunionectomy and metatarsal osteotomy second digit left foot.. DOS: 02/16/2018   Plan of Care:  1. Patient was evaluated. 2.  Today wound cultures were taken and sent to pathology for culture and sensitivity 3.  After evaluating the wound I do not believe that a negative pressure wound VAC would be indicated for this type of wound.  Recommend aggressive conservative wound management.  Patient may benefit from application of skin graft once the wound appears more healthy and viable with a good amount of granulation tissue. 4.  Recommend that the patient apply Betadine with dry sterile dressing daily to keep the wound clean and help dry the area.  5.  Continue oral clindamycin.  Patient is allergic to multiple antibiotics.  If cultures show resistance to clindamycin we may need to refer the patient over to infectious disease. 6.  Continue weightbearing in a postoperative shoe 7.  Medically necessary excisional debridement including subcutaneous tissue was performed using a tissue nipper.  Excisional debridement of all the necrotic nonviable tissue down to the healthy bleeding viable tissue was performed with post debridement measurement same as pre-. 8.  Return to clinic in 1 week   Edrick Kins, DPM [facility]  Dr.  Edrick Kins, DPM    [address]                                        [place], [place] [address]                Office [phone]  Fax [phone]

## 2018-03-19 DIAGNOSIS — M9902 Segmental and somatic dysfunction of thoracic region: Secondary | ICD-10-CM | POA: Diagnosis not present

## 2018-03-19 DIAGNOSIS — M9901 Segmental and somatic dysfunction of cervical region: Secondary | ICD-10-CM | POA: Diagnosis not present

## 2018-03-19 DIAGNOSIS — M9903 Segmental and somatic dysfunction of lumbar region: Secondary | ICD-10-CM | POA: Diagnosis not present

## 2018-03-19 DIAGNOSIS — M5136 Other intervertebral disc degeneration, lumbar region: Secondary | ICD-10-CM | POA: Diagnosis not present

## 2018-03-19 DIAGNOSIS — M5134 Other intervertebral disc degeneration, thoracic region: Secondary | ICD-10-CM | POA: Diagnosis not present

## 2018-03-19 LAB — WOUND CULTURE: Organism ID, Bacteria: NONE SEEN

## 2018-03-20 ENCOUNTER — Ambulatory Visit (INDEPENDENT_AMBULATORY_CARE_PROVIDER_SITE_OTHER): Payer: Medicare Other

## 2018-03-20 ENCOUNTER — Encounter: Payer: Self-pay | Admitting: Podiatry

## 2018-03-20 ENCOUNTER — Ambulatory Visit (INDEPENDENT_AMBULATORY_CARE_PROVIDER_SITE_OTHER): Payer: Medicare Other | Admitting: Podiatry

## 2018-03-20 DIAGNOSIS — Z9889 Other specified postprocedural states: Secondary | ICD-10-CM

## 2018-03-20 DIAGNOSIS — M2012 Hallux valgus (acquired), left foot: Secondary | ICD-10-CM | POA: Diagnosis not present

## 2018-03-21 ENCOUNTER — Encounter: Payer: Medicare Other | Attending: Physical Medicine & Rehabilitation | Admitting: Registered Nurse

## 2018-03-21 ENCOUNTER — Encounter: Payer: Medicare Other | Admitting: Podiatry

## 2018-03-21 ENCOUNTER — Encounter: Payer: Self-pay | Admitting: Registered Nurse

## 2018-03-21 VITALS — BP 115/71 | HR 68 | Ht 66.0 in | Wt 224.4 lb

## 2018-03-21 DIAGNOSIS — G894 Chronic pain syndrome: Secondary | ICD-10-CM | POA: Diagnosis not present

## 2018-03-21 DIAGNOSIS — Z5181 Encounter for therapeutic drug level monitoring: Secondary | ICD-10-CM | POA: Insufficient documentation

## 2018-03-21 DIAGNOSIS — Z79899 Other long term (current) drug therapy: Secondary | ICD-10-CM | POA: Diagnosis not present

## 2018-03-21 DIAGNOSIS — M546 Pain in thoracic spine: Secondary | ICD-10-CM | POA: Diagnosis not present

## 2018-03-21 DIAGNOSIS — M797 Fibromyalgia: Secondary | ICD-10-CM

## 2018-03-21 DIAGNOSIS — M25561 Pain in right knee: Secondary | ICD-10-CM

## 2018-03-21 DIAGNOSIS — M961 Postlaminectomy syndrome, not elsewhere classified: Secondary | ICD-10-CM | POA: Diagnosis not present

## 2018-03-21 DIAGNOSIS — M25562 Pain in left knee: Secondary | ICD-10-CM

## 2018-03-21 DIAGNOSIS — G609 Hereditary and idiopathic neuropathy, unspecified: Secondary | ICD-10-CM

## 2018-03-21 DIAGNOSIS — G8929 Other chronic pain: Secondary | ICD-10-CM

## 2018-03-21 MED ORDER — MORPHINE SULFATE ER 15 MG PO TBCR: 15.0000 mg | EXTENDED_RELEASE_TABLET | Freq: Three times a day (TID) | ORAL | 0 refills | Status: DC

## 2018-03-21 NOTE — Progress Notes (Signed)
Subjective:    Patient ID: [name], female    DOB: [birth year], 67 y.o.   MRN: [id number]  HPI: [name] is a 67 year old female who returns for follow up appointment for chronic pain and medication refill. She states her pain is located in her mid-back and right hip. She rates her pain 7. Her current exercise regime is walking.   S/P Bunion Repair left second metatarsal osteotomy on 02/16/2018, Dr. Milinda Pointer. Notes reviewed. On 03/12/2018 she developed wound dehiscence, wound was debrided, note reviewed.   [name] Morphine Equivalent is 45.00 MME. Last UDS was Performed on 07/31/2017, it was consistent. Oral Swab was Performed today.  Pain Inventory Average Pain 6 Pain Right Now 7 My pain is constant, burning and aching  In the last 24 hours, has pain interfered with the following? General activity 7 Relation with others 7 Enjoyment of life 7 What TIME of day is your pain at its worst? evening Sleep (in general) Fair  Pain is worse with: bending and standing Pain improves with: rest, heat/ice, therapy/exercise, pacing activities, medication and TENS Relief from Meds: .  Mobility walk without assistance ability to climb steps?  yes do you drive?  yes  Function disabled: date disabled . retired  Neuro/Psych bladder control problems weakness numbness tremor spasms depression  Prior Studies Any changes since last visit?  no  Physicians involved in your care Any changes since last visit?  no   Family History  Problem Relation Age of Onset  . Breast cancer Mother   . Diabetes Mother   . Heart disease Mother   . Allergies Mother   . Diabetes Father   . Heart disease Father   . Emphysema Father   . Allergies Father   . Stomach cancer Maternal Uncle   . Breast cancer Paternal Aunt   . Colon cancer Neg Hx    Social History   Socioeconomic History  . Marital status: Married    Spouse name: Not on file  . Number of children: Not on file  .  Years of education: Not on file  . Highest education level: Not on file  Occupational History  . Occupation: Medical laboratory scientific officer  . Financial resource strain: Not on file  . Food insecurity:    Worry: Not on file    Inability: Not on file  . Transportation needs:    Medical: Not on file    Non-medical: Not on file  Tobacco Use  . Smoking status: Never Smoker  . Smokeless tobacco: Never Used  Substance and Sexual Activity  . Alcohol use: Yes    Alcohol/week: 0.0 standard drinks    Comment: rare  . Drug use: No  . Sexual activity: Not on file  Lifestyle  . Physical activity:    Days per week: Not on file    Minutes per session: Not on file  . Stress: Not on file  Relationships  . Social connections:    Talks on phone: Not on file    Gets together: Not on file    Attends religious service: Not on file    Active member of club or organization: Not on file    Attends meetings of clubs or organizations: Not on file    Relationship status: Not on file  Other Topics Concern  . Not on file  Social History Narrative  . Not on file   Past Surgical History:  Procedure Laterality Date  . 24 HOUR  [place] STUDY N/A 01/04/2016   Procedure: 24 HOUR PH STUDY;  Surgeon: Manus Gunning, MD;  Location: [facility];  Service: Gastroenterology;  Laterality: N/A;  . ABDOMINAL HYSTERECTOMY  1988  . ADENOIDECTOMY    . ANTERIOR CERVICAL DECOMP/DISCECTOMY FUSION  2009, 1993   first procedure in ~ 1993/Dr Botero  . APPENDECTOMY  1988  . BACK SURGERY    . CHOLECYSTECTOMY  2005  . ESOPHAGEAL MANOMETRY N/A 01/04/2016   Procedure: ESOPHAGEAL MANOMETRY (EM) 24 HR PH;  Surgeon: Manus Gunning, MD;  Location: [facility];  Service: Gastroenterology;  Laterality: N/A;  . ESOPHAGOGASTRODUODENOSCOPY (EGD) WITH PROPOFOL N/A 05/17/2016   Procedure: ESOPHAGOGASTRODUODENOSCOPY (EGD) WITH PROPOFOL;  Surgeon: Manus Gunning, MD;  Location: [facility];  Service: Gastroenterology;   Laterality: N/A;  . NASAL SINUS SURGERY  2006  . SAVORY DILATION N/A 05/17/2016   Procedure: SAVORY DILATION;  Surgeon: Manus Gunning, MD;  Location: [facility];  Service: Gastroenterology;  Laterality: N/A;  . TONSILLECTOMY AND ADENOIDECTOMY    . TRANSTHORACIC ECHOCARDIOGRAM  2009   Normal LV size and function. EF 60-65%. No regional wall motion and amount is. Mild aortic sclerosis, no stenosis  . TUBAL LIGATION  1979   Past Medical History:  Diagnosis Date  . Allergy   . Anemia   . Arthritis   . B12 deficiency   . Complication of anesthesia    slow to wake up   . Depression   . Diabetes mellitus without complication ([place])    type II - diet controlled ; A1C 6.5 01/2017  . Fibromyalgia   . Generalized hyperhidrosis 04/17/2014  . GERD (gastroesophageal reflux disease)   . Heart murmur    Aortic Sclerosis  . Hyperlipemia   . HYPOTHYROIDISM 05/29/2007  . Irritable bowel syndrome   . Migraines    hx of   . Oxygen deficiency    2 L at bedtime  . 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 PONV (postoperative nausea and vomiting)   . PVC (premature ventricular contraction)   . Type II or unspecified type diabetes mellitus with neurological manifestations, not stated as uncontrolled(250.60) 04/07/2013  . Unspecified essential hypertension    denies htn on 05/09/16   BP 115/71   Pulse 68   Ht 5\' 6"  (1.676 m)   Wt 224 lb 6.4 oz (101.8 kg)   SpO2 98%   BMI 36.22 kg/m   Opioid Risk Score:   Fall Risk Score:  `1  Depression screen PHQ 2/9  Depression screen [facility] 2/9 01/10/2018 11/24/2017 03/27/2017 02/28/2017 01/25/2017 01/02/2017 10/26/2016  Decreased Interest 2 1 0 1 1 0 1  Down, Depressed, Hopeless 0 1 0 1 1 0 1  PHQ - 2 Score 2 2 0 2 2 0 2  Altered sleeping 0 - - - - - -  Tired, decreased energy 1 - - - - - -  Change in appetite 0 - - - - - -  Feeling bad or failure about yourself  0 - - - - - -  Trouble concentrating 1 - - - - - -  Moving  slowly or fidgety/restless 0 - - - - - -  Suicidal thoughts 0 - - - - - -  PHQ-9 Score 4 - - - - - -  Difficult doing work/chores Not difficult at all Not difficult at all - - - - -  Some recent data might be hidden  Review of Systems  Constitutional: Negative.   HENT: Negative.   Eyes: Negative.   Respiratory: Negative.   Cardiovascular: Negative.   Gastrointestinal: Positive for constipation.  Endocrine: Negative.   Genitourinary: Positive for difficulty urinating.  Musculoskeletal: Positive for arthralgias, back pain, joint swelling and myalgias.  Skin: Negative.   Allergic/Immunologic: Negative.   Neurological: Positive for tremors, weakness and numbness.  Hematological: Negative.   Psychiatric/Behavioral: Positive for dysphoric mood.  All other systems reviewed and are negative.      Objective:   Physical Exam  Constitutional: She is oriented to person, place, and time. She appears well-developed and well-nourished.  HENT:  Head: Normocephalic and atraumatic.  Neck: Normal range of motion. Neck supple.  Cardiovascular: Normal rate and regular rhythm.  Pulmonary/Chest: Effort normal and breath sounds normal.  Musculoskeletal:  Normal Muscle Bulk and Muscle Testing Reveals: Upper Extremities: Full ROM and Muscle Strength 5/5 Thoracic Paraspinal Tenderness: T-7-T-9 Right Greater Trochanter tenderness Lower Extremities: Right: Full ROM and Muscle Strength 5/5 Left: Decreased ROM and Muscle Strength 4/5  Left Lower Extremity with 2+Pitting edema noted Left: wearing Post op Shoe; Dressing Intact Arises from Table Slowly Antalgic gait  Neurological: She is alert and oriented to person, place, and time.  Skin: Skin is warm and dry.  Psychiatric: She has a normal mood and affect. Her behavior is normal.  Nursing note and vitals reviewed.         Assessment & Plan:  1. Cervical postlaminectomy syndrome: with neck and shoulder pain. 03/19/2018. Continue  exercise routine and using heat therapy. Continue current medication regimen withMS Contin 15 mg one tablet three times a day #90.  We will continue the opioid monitoring program, this consists of regular clinic visits, examinations, urine drug screen, pill counts as well as use of [place] Controlled Substance reporting System. 2. Fibromyalgia/ Neuropathy: Continuecurrent medication regimen withGabapentin, activity and exercise regime. 03/19/2018. 3. Bilateral Knee Pain/ Mild Degenerative Changes:.Continuecurrent medication regimen withVoltaren Gel.03/19/2018 4. Right Ankle Pain: No Complaints Today. Continue HEP.03/19/2018 5. Muscle Spasm: Continuecurrent medication regimen withRobaxin. 03/19/2018 6. Opioid Induced Constipation: No complaints Continue to Monitor. 03/19/2018. 7. Midline Low Back Pain: Continue current medication regime, continue HEP as tolerated.02/15/2018 8. Right Gluteal Medius Weakness and Atrophy: No complaints Today:Continue with Hip Abduction Exercises: Continue to Monitor. 02/15/2018.  20 minutes of face to face patient care time was spent during this visit. All questions were encouraged and answered.   F/U in 1 month

## 2018-03-24 DIAGNOSIS — J9610 Chronic respiratory failure, unspecified whether with hypoxia or hypercapnia: Secondary | ICD-10-CM | POA: Diagnosis not present

## 2018-03-25 NOTE — Progress Notes (Signed)
Subjective:  Patient presents today for evaluation of a postsurgical wound dehiscence status post bunionectomy to the left foot.  Date of surgery 02/16/2018.  Patient was last seen on 03/16/2018 at which time cultures were taken and patient was instructed to apply Iodosorb dressing.  Patient believes there is a significant amount of improvement since the last visit.  She has been weightbearing in the postoperative shoe.  Patient continues to have no pain.  She also notices less drainage to the area.  She presents for further treatment evaluation  Past Medical History:  Diagnosis Date  . Allergy   . Anemia   . Arthritis   . B12 deficiency   . Complication of anesthesia    slow to wake up   . Depression   . Diabetes mellitus without complication ([place])    type II - diet controlled ; A1C 6.5 01/2017  . Fibromyalgia   . Generalized hyperhidrosis 04/17/2014  . GERD (gastroesophageal reflux disease)   . Heart murmur    Aortic Sclerosis  . Hyperlipemia   . HYPOTHYROIDISM 05/29/2007  . Irritable bowel syndrome   . Migraines    hx of   . Oxygen deficiency    2 L at bedtime  . 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 PONV (postoperative nausea and vomiting)   . PVC (premature ventricular contraction)   . Type II or unspecified type diabetes mellitus with neurological manifestations, not stated as uncontrolled(250.60) 04/07/2013  . Unspecified essential hypertension    denies htn on 05/09/16     Objective/Physical Exam Neurovascular status intact.  Pedal pulses palpable.  Neurological sensation intact.  Significant amount of dehiscence of the [place] bunionectomy site measuring approximately 4.0 x 1.0 x 0.6 cm (LxWxD).  Measurements unchanged since last visit.  To the surgical wound dehiscence there is some stable eschar noted around the area likely consistent with a microvascular ischemia.  No malodor noted.  Serosanguineous drainage noted to the area has  significantly improved.  Ulceration appears much more dry than previous visit on 03/16/2018.  There continues to be no exposed bone, joint, or hardware noted.  Improvement of localized erythema periwound.  Erythema and edema appears to be somewhat resolved.  Incision site overlying the second MTPJ left foot appears to have a dry stable eschar overlying the incision site.  No drainage noted.  No malodor noted.  Assessment: 1. s/p [place] bunionectomy and metatarsal osteotomy second digit left foot.. DOS: 02/16/2018 2.  Surgical wound dehiscence possibly secondary to microvascular disease.  Plan of Care:  1. Patient was evaluated.  Cultures were reviewed today.  Due to the significant improvement of the wound over the last 4 days we will hold off on referring the patient to infectious disease.  Cultures grew Enterococcus faecalis sensitive to penicillin and vancomycin.  Patient has sensitivity allergy to penicillin. 2. Continue applying Iodosorb or Betadine daily with dry sterile dressing. 3.  Continue weightbearing in the postoperative shoe. 4.  Return to clinic in 10 days   Edrick Kins, DPM [facility]  Dr. Edrick Kins, [address]                                        [place], [place] [address]  Office [phone]  Fax [phone]

## 2018-03-26 LAB — DRUG TOX MONITOR 1 W/CONF, ORAL FLD

## 2018-03-26 LAB — DRUG TOX ALC METAB W/CON, ORAL FLD: Alcohol Metabolite: NEGATIVE ng/mL (ref <25)

## 2018-03-29 ENCOUNTER — Telehealth: Payer: Self-pay | Admitting: *Deleted

## 2018-03-29 NOTE — Telephone Encounter (Signed)
Oral swab was negative for all medications. This is the first oral swab for [name].  All urine tests have been consistent.

## 2018-03-30 ENCOUNTER — Encounter: Payer: Self-pay | Admitting: Podiatry

## 2018-03-30 ENCOUNTER — Ambulatory Visit (INDEPENDENT_AMBULATORY_CARE_PROVIDER_SITE_OTHER): Payer: Medicare Other | Admitting: Podiatry

## 2018-03-30 ENCOUNTER — Ambulatory Visit (INDEPENDENT_AMBULATORY_CARE_PROVIDER_SITE_OTHER): Payer: Medicare Other

## 2018-03-30 DIAGNOSIS — Z9889 Other specified postprocedural states: Secondary | ICD-10-CM

## 2018-03-30 DIAGNOSIS — M2012 Hallux valgus (acquired), left foot: Secondary | ICD-10-CM

## 2018-03-30 LAB — CBC WITH DIFFERENTIAL/PLATELET
Basophils Absolute: 0 10*3/uL (ref 0.0–0.2)
Basos: 0 %
EOS (ABSOLUTE): 0.2 10*3/uL (ref 0.0–0.4)
Eos: 4 %
Hematocrit: 36.6 % (ref 34.0–46.6)
Hemoglobin: 12.7 g/dL (ref 11.1–15.9)
Immature Grans (Abs): 0 10*3/uL (ref 0.0–0.1)
Immature Granulocytes: 0 %
Lymphocytes Absolute: 1.9 10*3/uL (ref 0.7–3.1)
Lymphs: 30 %
MCH: 30.4 pg (ref 26.6–33.0)
MCHC: 34.7 g/dL (ref 31.5–35.7)
MCV: 88 fL (ref 79–97)
Monocytes Absolute: 0.4 10*3/uL (ref 0.1–0.9)
Monocytes: 6 %
Neutrophils Absolute: 3.7 10*3/uL (ref 1.4–7.0)
Neutrophils: 60 %
Platelets: 255 10*3/uL (ref 150–450)
RBC: 4.18 x10E6/uL (ref 3.77–5.28)
RDW: 12.3 % (ref 12.3–15.4)
WBC: 6.2 10*3/uL (ref 3.4–10.8)

## 2018-03-30 LAB — SEDIMENTATION RATE: Sed Rate: 38 mm/hr (ref 0–40)

## 2018-03-31 ENCOUNTER — Other Ambulatory Visit: Payer: Self-pay | Admitting: Family Medicine

## 2018-04-02 ENCOUNTER — Telehealth: Payer: Self-pay | Admitting: Podiatry

## 2018-04-02 NOTE — Telephone Encounter (Signed)
Dr. Amalia Hailey, was the patient going to get IV antibiotics?  Her chart note is not in Epic   Please advise

## 2018-04-02 NOTE — Telephone Encounter (Signed)
Pt was suppose to get an Anti-Biotic IV for an infection in her foot and she hasn't heard anything please give her a call.

## 2018-04-03 ENCOUNTER — Other Ambulatory Visit: Payer: Self-pay | Admitting: Family Medicine

## 2018-04-06 ENCOUNTER — Ambulatory Visit (INDEPENDENT_AMBULATORY_CARE_PROVIDER_SITE_OTHER): Payer: Medicare Other | Admitting: Podiatry

## 2018-04-06 ENCOUNTER — Encounter: Payer: Self-pay | Admitting: Podiatry

## 2018-04-06 ENCOUNTER — Ambulatory Visit
Admission: RE | Admit: 2018-04-06 | Discharge: 2018-04-06 | Disposition: A | Discharge status: Home / Self Care | Payer: Medicare Other | Source: Ambulatory Visit | Attending: Registered Nurse | Admitting: Registered Nurse

## 2018-04-06 DIAGNOSIS — L97522 Non-pressure chronic ulcer of other part of left foot with fat layer exposed: Secondary | ICD-10-CM | POA: Diagnosis not present

## 2018-04-06 DIAGNOSIS — Z9889 Other specified postprocedural states: Secondary | ICD-10-CM

## 2018-04-06 DIAGNOSIS — T8131XD Disruption of external operation (surgical) wound, not elsewhere classified, subsequent encounter: Secondary | ICD-10-CM

## 2018-04-06 DIAGNOSIS — M25551 Pain in right hip: Secondary | ICD-10-CM | POA: Diagnosis not present

## 2018-04-06 DIAGNOSIS — I70245 Atherosclerosis of native arteries of left leg with ulceration of other part of foot: Secondary | ICD-10-CM

## 2018-04-06 DIAGNOSIS — E0843 Diabetes mellitus due to underlying condition with diabetic autonomic (poly)neuropathy: Secondary | ICD-10-CM

## 2018-04-06 NOTE — Progress Notes (Signed)
Subjective:  Patient presents today for evaluation of a postsurgical wound dehiscence status post bunionectomy to the left foot.  Date of surgery 02/16/2018.  Patient states that she is feeling better.  She denies any significant pain.  She is currently taking clindamycin as prescribed.  She presents for follow-up treatment evaluation  Past Medical History:  Diagnosis Date  . Allergy   . Anemia   . Arthritis   . B12 deficiency   . Complication of anesthesia    slow to wake up   . Depression   . Diabetes mellitus without complication ([place])    type II - diet controlled ; A1C 6.5 01/2017  . Fibromyalgia   . Generalized hyperhidrosis 04/17/2014  . GERD (gastroesophageal reflux disease)   . Heart murmur    Aortic Sclerosis  . Hyperlipemia   . HYPOTHYROIDISM 05/29/2007  . Irritable bowel syndrome   . Migraines    hx of   . Oxygen deficiency    2 L at bedtime  . 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 PONV (postoperative nausea and vomiting)   . PVC (premature ventricular contraction)   . Type II or unspecified type diabetes mellitus with neurological manifestations, not stated as uncontrolled(250.60) 04/07/2013  . Unspecified essential hypertension    denies htn on 05/09/16     Objective/Physical Exam Neurovascular status intact.  Pedal pulses are palpable.  Neurological sensation intact.  Significant amount of dehiscence of the Austin bunionectomy site measuring approximately 4.0 x 1.0 x 0.6 cm (LxWxD).  There is no significant improvement in the size of the wound today.  Measurements unchanged since last visit.  To the surgical wound dehiscence there is some stable eschar noted around the area likely consistent with a small vessel disease.  No malodor noted.  There continues to be no exposed bone, joint, or hardware noted.  There does appear to be some exposed EHL tendon.  Today there is increased erythema with some warmth to the area.  There is some  increase and sanguinous drainage.  Incision site overlying the second MTPJ left foot appears to have a dry stable eschar overlying the incision site.  No drainage noted.  No malodor noted.  Assessment: 1. s/p [place] bunionectomy and metatarsal osteotomy second digit left foot.. DOS: 02/16/2018 2.  Surgical wound dehiscence possibly secondary to small vessel disease.  Plan of Care:  1. Patient was evaluated.  Medically necessary excisional debridement of a portion of the necrotic nonviable tissue down to healthier bleeding viable tissue was performed with post debridement measurement same as pre-. 2.  There does appear to be some digression in the patient's condition today compared to last visit.  Today we ordered blood work and refer the patient to infectious disease.  Today there is increased warmth and erythema to the surgical area and the foot. 3.  Continue Betadine daily.  Patient has lots of wound care supplies at home and she has been changing the dressings daily at home. 4.  Return to clinic in 1 week.  Once infection is resolved and the wound appears healthier we may proceed with grafting of the dehiscence site.  Edrick Kins, DPM [facility]  Dr. Edrick Kins, DPM    [address]  [place], [place] [address]                Office [phone]  Fax [phone]

## 2018-04-12 ENCOUNTER — Telehealth: Payer: Self-pay

## 2018-04-12 NOTE — Telephone Encounter (Signed)
Referral has been entered in chart for [facility] location.  Patient notified and is ok with going to [facility] location

## 2018-04-12 NOTE — Telephone Encounter (Signed)
-----   Message from Edrick Kins, DPM sent at 04/06/2018  2:23 PM EDT ----- Regarding: refer to ID Please refer to infectious disease  Thanks, Dr. Amalia Hailey

## 2018-04-15 NOTE — Progress Notes (Signed)
Subjective:  Patient presents today for evaluation of a postsurgical wound dehiscence status post bunionectomy to the left foot.  Date of surgery 02/16/2018.  Patient believes the wound is looking better.  Patient did have blood work done which was all within normal limits.  She did not get a call about her IV antibiotics or consult for infectious disease.  Past Medical History:  Diagnosis Date  . Allergy   . Anemia   . Arthritis   . B12 deficiency   . Complication of anesthesia    slow to wake up   . Depression   . Diabetes mellitus without complication ([place])    type II - diet controlled ; A1C 6.5 01/2017  . Fibromyalgia   . Generalized hyperhidrosis 04/17/2014  . GERD (gastroesophageal reflux disease)   . Heart murmur    Aortic Sclerosis  . Hyperlipemia   . HYPOTHYROIDISM 05/29/2007  . Irritable bowel syndrome   . Migraines    hx of   . Oxygen deficiency    2 L at bedtime  . 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 PONV (postoperative nausea and vomiting)   . PVC (premature ventricular contraction)   . Type II or unspecified type diabetes mellitus with neurological manifestations, not stated as uncontrolled(250.60) 04/07/2013  . Unspecified essential hypertension    denies htn on 05/09/16     Objective/Physical Exam Neurovascular status intact.  Pedal pulses are palpable.  Neurological sensation intact.  Significant amount of dehiscence of the [place] bunionectomy site measuring approximately 4.0 x 1.0 x 0.6 cm (LxWxD).  There is no significant improvement in the size of the wound today.  Measurements unchanged since last visit.  To the surgical wound dehiscence there is some stable eschar noted around the area likely consistent with a small vessel disease.  No malodor noted.  There continues to be no exposed bone, joint, or hardware noted.  There does appear to be some exposed EHL tendon.  Today there is increased erythema with some warmth to the area.   There is some increase and sanguinous drainage.  Incision site overlying the second MTPJ left foot appears to have a dry stable eschar overlying the incision site.  No drainage noted.  No malodor noted.  Assessment: 1. s/p [place] bunionectomy and metatarsal osteotomy second digit left foot.. DOS: 02/16/2018 2.  Surgical wound dehiscence possibly secondary to small vessel disease.  Plan of Care:  1. Patient was evaluated.  Medically necessary excisional debridement including muscle and deep fascial tissue was performed using a tissue nipper.  Excisional debridement of all the necrotic nonviable tissue down to the healthy bleeding viable tissue was performed with post debridement measurement same as pre- 2.  Patient's labs were reviewed today. 3.  Today we will reinitiate the referral to infectious disease. 4.  Continue Betadine and dry sterile dressing daily 5.  Continue wearing postoperative shoe 6.  Return to clinic in 10 days  Edrick Kins, DPM [facility]  Dr. Edrick Kins, [address]                                        [place], [place] [address]                Office [phone]  Fax [phone]

## 2018-04-16 ENCOUNTER — Encounter: Payer: Self-pay | Admitting: Pulmonary Disease

## 2018-04-16 ENCOUNTER — Ambulatory Visit: Payer: Medicare Other | Admitting: Pulmonary Disease

## 2018-04-16 VITALS — BP 120/60 | HR 87 | Ht 66.0 in | Wt 227.0 lb

## 2018-04-16 DIAGNOSIS — J9611 Chronic respiratory failure with hypoxia: Secondary | ICD-10-CM | POA: Diagnosis not present

## 2018-04-16 DIAGNOSIS — G4734 Idiopathic sleep related nonobstructive alveolar hypoventilation: Secondary | ICD-10-CM

## 2018-04-16 DIAGNOSIS — E662 Morbid (severe) obesity with alveolar hypoventilation: Secondary | ICD-10-CM

## 2018-04-16 NOTE — Progress Notes (Signed)
[facility]  Chief Complaint  Patient presents with  . Follow-up    Pt is doing well overall.     Constitutional:  BP 120/60 (BP Location: Left Arm, Cuff Size: Normal)   Pulse 87   Ht 5\' 6"  (1.676 m)   Wt 227 lb (103 kg)   SpO2 97%   BMI 36.64 kg/m   Past Medical History:  B12 deficiency, Depression, Fibromyalgia, GERD, IBS, HTN, HLD, Hypothyroidism, Migraine HA, DM  Brief Summary:  [name] is a 67 y.o. 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  She had bunion surgery on her left foot.  Has follow up later this month.  Wears 2 liters oxygen at night.  Sleeps okay.  Not having snoring, or apnea at night.  Uses bathroom 1 or 2 times at night.  Feels rested during the day.  Not having headaches, cough, wheeze, chest pain, or leg swelling.   Physical Exam:   Appearance - well kempt  ENMT - clear nasal mucosa, midline nasal septum, no oral exudates, no LAN, trachea midline Respiratory - normal chest wall, normal respiratory effort, no accessory muscle use, no wheeze/rales CV - s1s2 regular rate and rhythm, 2/6 systolic murmur, no peripheral edema, radial pulses symmetric GI - soft, non tender, no masses Lymph - no adenopathy noted in neck and axillary areas MSK - normal muscle strength and tone, normal gait Ext - no cyanosis, clubbing, or joint inflammation noted; boot on Lt foot Skin - no rashes, lesions, or ulcers Neuro - oriented to person, place, and time Psych - normal mood and affect   Assessment/Plan:   Chronic respiratory failure with hypoxia. 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 - discussed importance of weight loss   Patient Instructions  Follow up in 1 year    Chesley Mires, MD [place] Pager:  [phone] 04/16/2018, 2:36 PM  Flow Sheet    Sleep tests:  PSG 02/26/15 >> AHI 4.4, SaO2 low 83%. Spent 23.80min with SpO2 <88%. ONO with RA 03/11/15 >> test time 8 hrs 50 min. Baseline SpO2 92%, low SpO2 78%. Spent 24 min with SpO2 < 88%.  Cardiac tests:  Echo 12/28/07 >> EF 60 to 65%, mild AR  Medications:   Allergies as of 04/16/2018      Reactions   Rizatriptan Benzoate Anaphylaxis   Throat and Tongue Swelling Closed   Sumatriptan Other (See Comments)   Throat closed & Tongue Swelling   Tizanidine Shortness Of Breath   Aleve [naproxen Sodium] Hives   Clarithromycin Hives   Cyclobenzaprine Hcl    REACTION: THROAT CLOSED,TOUNGE SWELLING   Doxycycline Diarrhea, Nausea And Vomiting   Reaction: unknown   Duloxetine Nausea Only   Keflex [cephalexin] Itching   Sores   Metronidazole Diarrhea, Nausea Only   Oxycodone Hcl Diarrhea   Vomiting   Oxycontin [oxycodone Hcl] Nausea And Vomiting   Penicillins    TOLERATES CEPHALOSPORINS Reaction: unknown Has patient had a PCN reaction causing immediate rash, facial/tongue/throat swelling, SOB or lightheadedness with hypotension: {unknown Has patient had a PCN reaction causing severe rash involving mucus membranes or skin necrosis: {unknown Has patient had a PCN reaction that required hospitalization {unknown Has patient had a PCN reaction occurring within the last 10 years: {no If all of the above answers are "NO", then may  proceed with Cephalosporin use.   Rosuvastatin    REACTION: MUSCLE ACHES   Septra [sulfamethoxazole-trimethoprim] Other (See Comments)   Rash and itching   Venlafaxine Hives   Voltaren [diclofenac Sodium] Hives   oral   Tramadol Diarrhea, Nausea And Vomiting, Palpitations      Medication List        Accurate as of 04/16/18  2:36 PM. Always use your most recent med list.          B COMPLEX 100 PO Take 100 mg by mouth daily.   bisacodyl 5 MG EC tablet Commonly known as:  DULCOLAX Take 5 mg by  mouth daily as needed for moderate constipation. Take 1-2 at hs   CALCIUM 600 PO Take 600 mg by mouth 2 (two) times daily.   carboxymethylcellulose 0.5 % Soln Commonly known as:  REFRESH PLUS Place 1 drop into both eyes 3 (three) times daily as needed.   cetirizine 10 MG tablet Commonly known as:  ZYRTEC Take 10 mg by mouth daily.   citalopram 40 MG tablet Commonly known as:  CELEXA TAKE 1 TABLET BY MOUTH  DAILY   Coenzyme Q-10 100 MG capsule Take 100 mg by mouth daily.   diclofenac sodium 1 % Gel Commonly known as:  VOLTAREN Apply 4 g topically 4 (four) times daily as needed.   docusate sodium 100 MG capsule Commonly known as:  COLACE Take 400 mg by mouth at bedtime.   gabapentin 300 MG capsule Commonly known as:  NEURONTIN Take 1 capsule (300 mg total) by mouth 3 (three) times daily.   GRAPE SEED EXTRACT PO Take by mouth. 1 tablet daily   levothyroxine 50 MCG tablet Commonly known as:  SYNTHROID, LEVOTHROID TAKE 1 TABLET BY MOUTH  DAILY   MAGNESIUM GLYCINATE PLUS PO Take 400 mg by mouth at bedtime.   methocarbamol 500 MG tablet Commonly known as:  ROBAXIN TAKE ONE (1) TABLET BY MOUTH EVERY EIGHTHOURS AS NEEDED FOR MUSCLE SPASMS   mirtazapine 15 MG tablet Commonly known as:  REMERON TAKE 1 TABLET BY MOUTH AT  BEDTIME   morphine 15 MG 12 hr tablet Commonly known as:  MS CONTIN Take 1 tablet (15 mg total) by mouth 3 (three) times daily.   NON FORMULARY Daith piercing in right ear for headaches   ondansetron 4 MG tablet Commonly known as:  ZOFRAN Take 1 tablet (4 mg total) by mouth every 8 (eight) hours as needed for nausea or vomiting.   oxybutynin 10 MG 24 hr tablet Commonly known as:  DITROPAN-XL TAKE ONE TABLET BY MOUTH EVERY NIGHT AT BEDTIME   RABEprazole 20 MG tablet Commonly known as:  ACIPHEX TAKE 1 TABLET BY MOUTH TWICE A DAY   TURMERIC PO Take 800 mg by mouth daily.   Vitamin D 2000 units tablet Take 2,000 Units by mouth daily.        Past Surgical History:  She  has a past surgical history that includes Appendectomy (1988); Cholecystectomy (2005); Tubal ligation (1979); Tonsillectomy and adenoidectomy; Anterior cervical decomp/discectomy fusion (2009, 1993); Nasal sinus surgery (2006); Abdominal hysterectomy (1988); Esophageal manometry (N/A, 01/04/2016); 24 hour ph study (N/A, 01/04/2016); transthoracic echocardiogram (2009); Back surgery; Adenoidectomy; Esophagogastroduodenoscopy (egd) with propofol (N/A, 05/17/2016); and Savory dilation (N/A, 05/17/2016).  Family History:  Her family history includes Allergies in her father and mother; Breast cancer in her mother and paternal aunt; Diabetes in her father and mother; Emphysema in her father; Heart disease in her father and mother; Stomach cancer in  her maternal uncle.  Social History:  She  reports that she has never smoked. She has never used smokeless tobacco. She reports that she drinks alcohol. She reports that she does not use drugs.

## 2018-04-16 NOTE — Patient Instructions (Signed)
Follow up in 1 year.

## 2018-04-18 ENCOUNTER — Encounter: Payer: Self-pay | Admitting: Registered Nurse

## 2018-04-18 ENCOUNTER — Encounter: Payer: Medicare Other | Attending: Physical Medicine & Rehabilitation | Admitting: Registered Nurse

## 2018-04-18 VITALS — BP 122/75 | HR 70 | Ht 66.0 in | Wt 226.4 lb

## 2018-04-18 DIAGNOSIS — G609 Hereditary and idiopathic neuropathy, unspecified: Secondary | ICD-10-CM

## 2018-04-18 DIAGNOSIS — Z5181 Encounter for therapeutic drug level monitoring: Secondary | ICD-10-CM | POA: Diagnosis not present

## 2018-04-18 DIAGNOSIS — M7061 Trochanteric bursitis, right hip: Secondary | ICD-10-CM

## 2018-04-18 DIAGNOSIS — M961 Postlaminectomy syndrome, not elsewhere classified: Secondary | ICD-10-CM

## 2018-04-18 DIAGNOSIS — M797 Fibromyalgia: Secondary | ICD-10-CM | POA: Diagnosis not present

## 2018-04-18 DIAGNOSIS — G894 Chronic pain syndrome: Secondary | ICD-10-CM | POA: Insufficient documentation

## 2018-04-18 DIAGNOSIS — Z79899 Other long term (current) drug therapy: Secondary | ICD-10-CM | POA: Insufficient documentation

## 2018-04-18 DIAGNOSIS — G8929 Other chronic pain: Secondary | ICD-10-CM

## 2018-04-18 DIAGNOSIS — M546 Pain in thoracic spine: Secondary | ICD-10-CM

## 2018-04-18 MED ORDER — MORPHINE SULFATE ER 15 MG PO TBCR: 15.0000 mg | EXTENDED_RELEASE_TABLET | Freq: Three times a day (TID) | ORAL | 0 refills | Status: DC

## 2018-04-18 NOTE — Progress Notes (Signed)
Subjective:    Patient ID: [name], female    DOB: January 26, 1951, 67 y.o.   MRN: [id number]  HPI: [name] is a 67 year old female who returns for follow up appointment for chronic pain and medication refill. She states her pain is located in her mid- lower back and right hip pain. She rates her pain 6. Her current exercise regime is walking short distances.   [name] Morphine Equivalent is 45.00 MME. Last Oral Swab was Performed on 03/21/2018, see note for details.    Pain Inventory Average Pain 6 Pain Right Now 6 My pain is sharp, burning and aching  In the last 24 hours, has pain interfered with the following? General activity 8 Relation with others 8 Enjoyment of life 8 What TIME of day is your pain at its worst? . Sleep (in general) Fair  Pain is worse with: walking, bending, sitting, inactivity, standing and some activites Pain improves with: rest, heat/ice, therapy/exercise, pacing activities, medication, TENS and injections Relief from Meds: 4  Mobility walk without assistance ability to climb steps?  no do you drive?  yes  Function retired  Neuro/Psych bladder control problems weakness numbness tremor tingling spasms depression  Prior Studies Any changes since last visit?  no  Physicians involved in your care Any changes since last visit?  no   Family History  Problem Relation Age of Onset  . Breast cancer Mother   . Diabetes Mother   . Heart disease Mother   . Allergies Mother   . Diabetes Father   . Heart disease Father   . Emphysema Father   . Allergies Father   . Stomach cancer Maternal Uncle   . Breast cancer Paternal Aunt   . Colon cancer Neg Hx    Social History   Socioeconomic History  . Marital status: Married    Spouse name: Not on file  . Number of children: Not on file  . Years of education: Not on file  . Highest education level: Not on file  Occupational History  . Occupation: Medical laboratory scientific officer  .  Financial resource strain: Not on file  . Food insecurity:    Worry: Not on file    Inability: Not on file  . Transportation needs:    Medical: Not on file    Non-medical: Not on file  Tobacco Use  . Smoking status: Never Smoker  . Smokeless tobacco: Never Used  Substance and Sexual Activity  . Alcohol use: Yes    Alcohol/week: 0.0 standard drinks    Comment: rare  . Drug use: No  . Sexual activity: Not on file  Lifestyle  . Physical activity:    Days per week: Not on file    Minutes per session: Not on file  . Stress: Not on file  Relationships  . Social connections:    Talks on phone: Not on file    Gets together: Not on file    Attends religious service: Not on file    Active member of club or organization: Not on file    Attends meetings of clubs or organizations: Not on file    Relationship status: Not on file  Other Topics Concern  . Not on file  Social History Narrative  . Not on file   Past Surgical History:  Procedure Laterality Date  . [place] STUDY N/A 01/04/2016   Procedure: [place] STUDY;  Surgeon: Manus Gunning, MD;  Location:  [facility];  Service: Gastroenterology;  Laterality: N/A;  . ABDOMINAL HYSTERECTOMY  1988  . ADENOIDECTOMY    . ANTERIOR CERVICAL DECOMP/DISCECTOMY FUSION  2009, 1993   first procedure in ~ 1993/Dr Botero  . APPENDECTOMY  1988  . BACK SURGERY    . CHOLECYSTECTOMY  2005  . ESOPHAGEAL MANOMETRY N/A 01/04/2016   Procedure: ESOPHAGEAL MANOMETRY (EM) 24 HR PH;  Surgeon: Manus Gunning, MD;  Location: [facility];  Service: Gastroenterology;  Laterality: N/A;  . ESOPHAGOGASTRODUODENOSCOPY (EGD) WITH PROPOFOL N/A 05/17/2016   Procedure: ESOPHAGOGASTRODUODENOSCOPY (EGD) WITH PROPOFOL;  Surgeon: Manus Gunning, MD;  Location: [facility];  Service: Gastroenterology;  Laterality: N/A;  . NASAL SINUS SURGERY  2006  . SAVORY DILATION N/A 05/17/2016   Procedure: SAVORY DILATION;  Surgeon: Manus Gunning, MD;  Location: [facility];  Service: Gastroenterology;  Laterality: N/A;  . TONSILLECTOMY AND ADENOIDECTOMY    . TRANSTHORACIC ECHOCARDIOGRAM  2009   Normal LV size and function. EF 60-65%. No regional wall motion and amount is. Mild aortic sclerosis, no stenosis  . TUBAL LIGATION  1979   Past Medical History:  Diagnosis Date  . Allergy   . Anemia   . Arthritis   . B12 deficiency   . Complication of anesthesia    slow to wake up   . Depression   . Diabetes mellitus without complication ([place])    type II - diet controlled ; A1C 6.5 01/2017  . Fibromyalgia   . Generalized hyperhidrosis 04/17/2014  . GERD (gastroesophageal reflux disease)   . Heart murmur    Aortic Sclerosis  . Hyperlipemia   . HYPOTHYROIDISM 05/29/2007  . Irritable bowel syndrome   . Migraines    hx of   . Oxygen deficiency    2 L at bedtime  . 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 PONV (postoperative nausea and vomiting)   . PVC (premature ventricular contraction)   . Type II or unspecified type diabetes mellitus with neurological manifestations, not stated as uncontrolled(250.60) 04/07/2013  . Unspecified essential hypertension    denies htn on 05/09/16   BP 122/75   Pulse 70   Ht 5\' 6"  (1.676 m)   Wt 226 lb 6.4 oz (102.7 kg)   SpO2 97%   BMI 36.54 kg/m   Opioid Risk Score:   Fall Risk Score:  `1  Depression screen PHQ 2/9  Depression screen [facility] 2/9 01/10/2018 11/24/2017 03/27/2017 02/28/2017 01/25/2017 01/02/2017 10/26/2016  Decreased Interest 2 1 0 1 1 0 1  Down, Depressed, Hopeless 0 1 0 1 1 0 1  PHQ - 2 Score 2 2 0 2 2 0 2  Altered sleeping 0 - - - - - -  Tired, decreased energy 1 - - - - - -  Change in appetite 0 - - - - - -  Feeling bad or failure about yourself  0 - - - - - -  Trouble concentrating 1 - - - - - -  Moving slowly or fidgety/restless 0 - - - - - -  Suicidal thoughts 0 - - - - - -  PHQ-9 Score 4 - - - - - -  Difficult doing work/chores Not  difficult at all Not difficult at all - - - - -  Some recent data might be hidden     Review of Systems  Constitutional: Negative.   HENT: Negative.   Eyes: Negative.   Respiratory:  Negative.   Cardiovascular: Negative.   Gastrointestinal: Positive for constipation.  Endocrine: Negative.   Genitourinary: Negative.   Musculoskeletal: Positive for arthralgias, back pain, gait problem, joint swelling and myalgias.  Skin: Negative.   Allergic/Immunologic: Negative.   Neurological: Positive for tremors, weakness and numbness.  Hematological: Negative.   Psychiatric/Behavioral: Positive for dysphoric mood.  All other systems reviewed and are negative.      Objective:   Physical Exam  Constitutional: She is oriented to person, place, and time. She appears well-developed and well-nourished.  HENT:  Head: Normocephalic and atraumatic.  Neck: Normal range of motion. Neck supple.  Cardiovascular: Normal rate and regular rhythm.  Pulmonary/Chest: Effort normal and breath sounds normal. No respiratory distress.  Musculoskeletal:  Normal Muscle Bulk and Muscle Testing Reveals: Upper Extremities: Full ROM and Muscle Strength 5/5 Back without spinal tenderness noted Right Greater Trochanter Tenderness Lower Extremities: Full ROM and Muscle Strength 5/5 Left Lower Extremity with 2+ Pitting edema Wearing Left Foot Post Op Shoe with Dressing Intact Arises from Table Slowly Antalgic gait  Neurological: She is alert and oriented to person, place, and time.  Skin: Skin is warm and dry.  Psychiatric: She has a normal mood and affect. Her behavior is normal.  Nursing note and vitals reviewed.         Assessment & Plan:  1. Cervical postlaminectomy syndrome: with neck and shoulder pain. 04/18/2018. Continue exercise routine and using heat therapy. Continue current medication regimen withMS Contin 15 mg one tablet three times a day #90.  We will continue the opioid monitoring program,  this consists of regular clinic visits, examinations, urine drug screen, pill counts as well as use of [place] Controlled Substance reporting System. 2. Fibromyalgia/ Neuropathy: Continuecurrent medication regimen withGabapentin, activity and exercise regime. 04/18/2018. 3. Bilateral Knee Pain/ Mild Degenerative Changes:.Continuecurrent medication regimen withVoltaren Gel.04/18/2018 4. Right Ankle Pain: No Complaints Today. Continue to monitor..04/18/2018 5. Muscle Spasm: Continuecurrent medication regimen withRobaxin. 04/18/2018 6. Opioid Induced Constipation: No complaints Continue to Monitor. 04/18/2018. 7. Midline Low Back Pain: Continue current medication regime, continue HEP as tolerated.04/18/2018 8. Right Gluteal Medius Weakness and Atrophy: No complaints Today:Continue with Hip Abduction Exercises as tolerated: Continue to Monitor. 04/18/2018. 9. Right Greater Trochanter Bursitis: Continue to Alternate Ice and Heat Therapy. Continue to Monitor. 04/18/2018.   20 minutes of face to face patient care time was spent during this visit. All questions were encouraged and answered.   F/U in 1 month

## 2018-04-23 DIAGNOSIS — J9610 Chronic respiratory failure, unspecified whether with hypoxia or hypercapnia: Secondary | ICD-10-CM | POA: Diagnosis not present

## 2018-04-24 ENCOUNTER — Ambulatory Visit: Payer: Medicare Other | Admitting: Internal Medicine

## 2018-04-24 ENCOUNTER — Encounter: Payer: Self-pay | Admitting: Internal Medicine

## 2018-04-24 DIAGNOSIS — Z9889 Other specified postprocedural states: Secondary | ICD-10-CM

## 2018-04-24 DIAGNOSIS — T8149XA Infection following a procedure, other surgical site, initial encounter: Secondary | ICD-10-CM | POA: Diagnosis not present

## 2018-04-24 NOTE — Progress Notes (Signed)
[name] for Infectious Disease  Reason for Consult: Postoperative wound infection Referring Provider: Dr. Daylene Katayama  Assessment: She has had problems with wound healing postoperatively and probably had some superficial wound infection.  Recent wound cultures grew enterococcus which would not have been covered by clindamycin.  She has been improving over the last few weeks suggesting that enterococcus is not a significant pathogen.  I reviewed management options including continued observation off of antibiotics with wound care, starting oral linezolid or starting IV antibiotics.  We both agree with observation off of antibiotics for now and close follow-up in 1 week.   Plan: 1. Observe off of antibiotics for now 2. Follow-up in 1 week  Patient Active Problem List   Diagnosis Date Noted  . Status post bunionectomy 04/24/2018    Priority: High  . Postoperative wound infection 04/24/2018    Priority: High  . Lipoma of buttock 01/10/2017  . Chronic respiratory failure with hypoxia ([place]) 04/07/2015  . Obesity hypoventilation syndrome ([place]) 03/26/2015  . Sleep related hypoxia 03/09/2015  . Generalized hyperhidrosis 04/17/2014  . Obesity (BMI 30-39.9) 08/23/2013  . Type 2 diabetes mellitus with diabetic neuropathy ([place]) 04/07/2013  . 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 Postlaminectomy syndrome, cervical region 08/29/2011  . Dysphagia 07/14/2010  . FATIGUE 05/04/2009  . TRANSAMINASES, SERUM, ELEVATED 12/24/2008  . Depression, major, recurrent, in partial remission ([place]) 09/22/2008  . ALLERGIC RHINITIS 09/22/2008  . Hypothyroidism 05/29/2007  . HYPERLIPIDEMIA 05/29/2007  . Vitamin D deficiency 01/11/2007  . FIBROMYALGIA 01/11/2007  . Vitamin B 12 deficiency 11/13/2006  . Essential hypertension 11/13/2006  . GERD 11/13/2006    Patient's Medications  New Prescriptions   No medications on file  Previous Medications   B  COMPLEX VITAMINS (B COMPLEX 100 PO)    Take 100 mg by mouth daily.    BISACODYL (DULCOLAX) 5 MG EC TABLET    Take 5 mg by mouth daily as needed for moderate constipation. Take 1-2 at hs   CALCIUM CARBONATE (CALCIUM 600 PO)    Take 600 mg by mouth 2 (two) times daily.    CARBOXYMETHYLCELLULOSE (REFRESH PLUS) 0.5 % SOLN    Place 1 drop into both eyes 3 (three) times daily as needed.    CETIRIZINE (ZYRTEC) 10 MG TABLET    Take 10 mg by mouth daily.   CHOLECALCIFEROL (VITAMIN D) 2000 UNITS TABLET    Take 2,000 Units by mouth daily.     CITALOPRAM (CELEXA) 40 MG TABLET    TAKE 1 TABLET BY MOUTH  DAILY   COENZYME Q-10 100 MG CAPSULE    Take 100 mg by mouth daily.   DICLOFENAC SODIUM (VOLTAREN) 1 % GEL    Apply 4 g topically 4 (four) times daily as needed.   DOCUSATE SODIUM (COLACE) 100 MG CAPSULE    Take 400 mg by mouth at bedtime.    GABAPENTIN (NEURONTIN) 300 MG CAPSULE    Take 1 capsule (300 mg total) by mouth 3 (three) times daily.   GRAPE SEED EXTRACT PO    Take by mouth. 1 tablet daily   LEVOTHYROXINE (SYNTHROID, LEVOTHROID) 50 MCG TABLET    TAKE 1 TABLET BY MOUTH  DAILY   MAGNESIUM GLYCINATE PLUS PO    Take 400 mg by mouth at bedtime.   METHOCARBAMOL (ROBAXIN) 500 MG TABLET    TAKE ONE (1) TABLET BY MOUTH EVERY EIGHTHOURS AS NEEDED FOR MUSCLE SPASMS   MIRTAZAPINE (  REMERON) 15 MG TABLET    TAKE 1 TABLET BY MOUTH AT  BEDTIME   MORPHINE (MS CONTIN) 15 MG 12 HR TABLET    Take 1 tablet (15 mg total) by mouth 3 (three) times daily.   NON FORMULARY    Daith piercing in right ear for headaches   ONDANSETRON (ZOFRAN) 4 MG TABLET    Take 1 tablet (4 mg total) by mouth every 8 (eight) hours as needed for nausea or vomiting.   OXYBUTYNIN (DITROPAN-XL) 10 MG 24 HR TABLET    TAKE ONE TABLET BY MOUTH EVERY NIGHT AT BEDTIME   RABEPRAZOLE (ACIPHEX) 20 MG TABLET    TAKE 1 TABLET BY MOUTH TWICE A DAY   TURMERIC PO    Take 800 mg by mouth daily.   Modified Medications   No medications on file  Discontinued  Medications   No medications on file    HPI: [name] is a 67 y.o. female who underwent left foot bunionectomy and second tarsal osteotomy on 02/16/2018.  She had a fair amount of postoperative bruising around the incision and developed wound dehiscence on 03/11/2018.  She had been placed on oral clindamycin after surgery and took a total of 3 10-day courses.  The wound was debrided on 03/12/2018.  It was debrided again on 03/16/2018 and soft tissue specimens were submitted for culture which revealed a sensitive Enterococcus faecalis.  The wound was noted to be worse on 03/30/2018.  She completed her last course of clindamycin about 2 weeks ago.  She has multiple antibiotic allergies and intolerances.  She feels like her wound is improving steadily now.  She changes her dressing daily.  Review of Systems: Review of Systems  Constitutional: Negative for chills, diaphoresis and fever.  Gastrointestinal: Negative for abdominal pain, diarrhea, nausea and vomiting.  Musculoskeletal: Positive for joint pain.  Neurological: Positive for sensory change.       She has some numbness over the dorsum of her left foot.      Past Medical History:  Diagnosis Date  . Allergy   . Anemia   . Arthritis   . B12 deficiency   . Complication of anesthesia    slow to wake up   . Depression   . Diabetes mellitus without complication ([place])    type II - diet controlled ; A1C 6.5 01/2017  . Fibromyalgia   . Generalized hyperhidrosis 04/17/2014  . GERD (gastroesophageal reflux disease)   . Heart murmur    Aortic Sclerosis  . Hyperlipemia   . HYPOTHYROIDISM 05/29/2007  . Irritable bowel syndrome   . Migraines    hx of   . Oxygen deficiency    2 L at bedtime  . 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 PONV (postoperative nausea and vomiting)   . PVC (premature ventricular contraction)   . Type II or unspecified type diabetes mellitus with neurological manifestations, not stated  as uncontrolled(250.60) 04/07/2013  . Unspecified essential hypertension    denies htn on 05/09/16    Social History   Tobacco Use  . Smoking status: Never Smoker  . Smokeless tobacco: Never Used  Substance Use Topics  . Alcohol use: Yes    Alcohol/week: 0.0 standard drinks    Comment: rare  . Drug use: No    Family History  Problem Relation Age of Onset  . Breast cancer Mother   . Diabetes Mother   . Heart disease Mother   .  Allergies Mother   . Diabetes Father   . Heart disease Father   . Emphysema Father   . Allergies Father   . Stomach cancer Maternal Uncle   . Breast cancer Paternal Aunt   . Colon cancer Neg Hx    Allergies  Allergen Reactions  . Rizatriptan Benzoate Anaphylaxis    Throat and Tongue Swelling Closed  . Sumatriptan Other (See Comments)    Throat closed & Tongue Swelling  . Tizanidine Shortness Of Breath  . Aleve [Naproxen Sodium] Hives  . Clarithromycin Hives  . Cyclobenzaprine Hcl     REACTION: THROAT CLOSED,TOUNGE SWELLING  . Doxycycline Diarrhea and Nausea And Vomiting    Reaction: unknown  . Duloxetine Nausea Only  . Keflex [Cephalexin] Itching    Sores  . Metronidazole Diarrhea and Nausea Only  . Oxycodone Hcl Diarrhea    Vomiting  . Oxycontin [Oxycodone Hcl] Nausea And Vomiting  . Penicillins     TOLERATES CEPHALOSPORINS Reaction: unknown Has patient had a PCN reaction causing immediate rash, facial/tongue/throat swelling, SOB or lightheadedness with hypotension: {unknown Has patient had a PCN reaction causing severe rash involving mucus membranes or skin necrosis: {unknown Has patient had a PCN reaction that required hospitalization {unknown Has patient had a PCN reaction occurring within the last 10 years: {no If all of the above answers are "NO", then may proceed with Cephalosporin use.  . Rosuvastatin     REACTION: MUSCLE ACHES  . Septra [Sulfamethoxazole-Trimethoprim] Other (See Comments)    Rash and itching  . Venlafaxine  Hives  . Voltaren [Diclofenac Sodium] Hives    oral  . Tramadol Diarrhea, Nausea And Vomiting and Palpitations    OBJECTIVE: Vitals:   04/24/18 1000  BP: 120/71  Pulse: 75  Temp: 98.6 F (37 C)  Weight: 226 lb (102.5 kg)   Body mass index is 36.48 kg/m.   Physical Exam  Constitutional: She is oriented to person, place, and time.  She is very pleasant and in no distress.  Musculoskeletal:  Her left foot is warm and well perfused.  She has an open wound over the dorsum of the left great toe and first metatarsal (see attached photo).  It is looking much better than it did on 03/16/2018.  There is no exposed bone or hardware.  There is much less necrotic tissue.  There is only scant bloody drainage.  There is no malodor.  Neurological: She is alert and oriented to person, place, and time.  Psychiatric: She has a normal mood and affect.      Microbiology: No results found for this or any previous visit (from the past 240 hour(s)).  Michel Bickers, MD [facility] [phone] pager   [phone] cell 04/24/2018, 10:25 AM

## 2018-04-24 NOTE — Assessment & Plan Note (Signed)
She has had problems with wound healing postoperatively and probably had some superficial wound infection.  Recent wound cultures grew enterococcus which would not have been covered by clindamycin.  She has been improving over the last few weeks suggesting that enterococcus is not a significant pathogen.  I reviewed management options including continued observation off of antibiotics with wound care, starting oral linezolid or starting IV antibiotics.  We both agree with observation off of antibiotics for now and close follow-up in 1 week.

## 2018-04-27 ENCOUNTER — Ambulatory Visit (INDEPENDENT_AMBULATORY_CARE_PROVIDER_SITE_OTHER): Payer: Medicare Other | Admitting: Podiatry

## 2018-04-27 ENCOUNTER — Encounter: Payer: Self-pay | Admitting: Podiatry

## 2018-04-27 DIAGNOSIS — E0843 Diabetes mellitus due to underlying condition with diabetic autonomic (poly)neuropathy: Secondary | ICD-10-CM | POA: Diagnosis not present

## 2018-04-27 DIAGNOSIS — I70245 Atherosclerosis of native arteries of left leg with ulceration of other part of foot: Secondary | ICD-10-CM

## 2018-04-27 DIAGNOSIS — T8131XD Disruption of external operation (surgical) wound, not elsewhere classified, subsequent encounter: Secondary | ICD-10-CM

## 2018-04-27 DIAGNOSIS — L97522 Non-pressure chronic ulcer of other part of left foot with fat layer exposed: Secondary | ICD-10-CM | POA: Diagnosis not present

## 2018-04-27 DIAGNOSIS — Z9889 Other specified postprocedural states: Secondary | ICD-10-CM

## 2018-04-28 NOTE — Progress Notes (Signed)
Subjective:  Patient presents today for evaluation of a postsurgical wound dehiscence status post bunionectomy to the left foot.  Date of surgery 02/16/2018.  Patient believes the wound is looking better.  She says that she is developing some granulation tissue along the dehiscence site.  She also saw infectious disease who is currently managing any infectious aspect of the wound.  Significant improvement today based on last visit on 04/06/2018.  No new complaints she continues to apply Betadine and dry sterile dressings daily.  Past Medical History:  Diagnosis Date  . Allergy   . Anemia   . Arthritis   . B12 deficiency   . Complication of anesthesia    slow to wake up   . Depression   . Diabetes mellitus without complication ([place])    type II - diet controlled ; A1C 6.5 01/2017  . Fibromyalgia   . Generalized hyperhidrosis 04/17/2014  . GERD (gastroesophageal reflux disease)   . Heart murmur    Aortic Sclerosis  . Hyperlipemia   . HYPOTHYROIDISM 05/29/2007  . Irritable bowel syndrome   . Migraines    hx of   . Oxygen deficiency    2 L at bedtime  . 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 PONV (postoperative nausea and vomiting)   . PVC (premature ventricular contraction)   . Type II or unspecified type diabetes mellitus with neurological manifestations, not stated as uncontrolled(250.60) 04/07/2013  . Unspecified essential hypertension    denies htn on 05/09/16     Objective/Physical Exam Neurovascular status intact.  Pedal pulses are palpable.  Neurological sensation intact.  Significant amount of dehiscence of the Austin bunionectomy site measuring approximately 3.7 x 1.0 x 0.5 cm (LxWxD).  There is no significant improvement in the size of the wound today.  Measurements unchanged since last visit.  To the surgical wound dehiscence there is some stable eschar noted around the area likely consistent with a small vessel disease.  No malodor noted.   There continues to be no exposed bone, joint, or hardware noted.  There continues to be exposed EHL tendon.  T significantly improved erythema to the area.  Negative for any significant erythema at the moment or edema.  Incision site overlying the second MTPJ left foot appears to have a dry stable eschar overlying the incision site.  No drainage noted.  No malodor noted.  Assessment: 1. s/p Austin bunionectomy and metatarsal osteotomy second digit left foot.. DOS: 02/16/2018 2.  Surgical wound dehiscence possibly secondary to small vessel disease.  Plan of Care:  1. Patient was evaluated.  Medically necessary excisional debridement including muscle and deep fascial tissue was performed using a tissue nipper.  Excisional debridement of all the necrotic nonviable tissue down to the healthy bleeding viable tissue was performed with post debridement measurement same as pre- 2.  Today we are going to request authorization for skin graft.  Please note the patient has had this wound for greater than 6 weeks and is failed all conservative modalities and satisfactory alleviation of symptoms with the patient.  This will be medically necessary to aid in healing of the wound. 3.  Return to clinic in 2 weeks for possible application of skin graft  Edrick Kins, DPM [facility]  Dr. Edrick Kins, [address]  [place], [place] [address]                Office [phone]  Fax [phone]

## 2018-05-09 ENCOUNTER — Encounter: Payer: Self-pay | Admitting: Internal Medicine

## 2018-05-09 ENCOUNTER — Ambulatory Visit (INDEPENDENT_AMBULATORY_CARE_PROVIDER_SITE_OTHER): Payer: Medicare Other | Admitting: Internal Medicine

## 2018-05-09 DIAGNOSIS — T8149XA Infection following a procedure, other surgical site, initial encounter: Secondary | ICD-10-CM | POA: Diagnosis not present

## 2018-05-09 NOTE — Progress Notes (Signed)
[facility] for Infectious Disease  Patient Active Problem List   Diagnosis Date Noted  . Status post bunionectomy 04/24/2018    Priority: High  . Postoperative wound infection 04/24/2018    Priority: High  . Lipoma of buttock 01/10/2017  . Chronic respiratory failure with hypoxia ([place]) 04/07/2015  . Obesity hypoventilation syndrome ([place]) 03/26/2015  . Sleep related hypoxia 03/09/2015  . Generalized hyperhidrosis 04/17/2014  . Obesity (BMI 30-39.9) 08/23/2013  . Type 2 diabetes mellitus with diabetic neuropathy ([place]) 04/07/2013  . 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 Postlaminectomy syndrome, cervical region 08/29/2011  . Dysphagia 07/14/2010  . FATIGUE 05/04/2009  . TRANSAMINASES, SERUM, ELEVATED 12/24/2008  . Depression, major, recurrent, in partial remission ([place]) 09/22/2008  . ALLERGIC RHINITIS 09/22/2008  . Hypothyroidism 05/29/2007  . HYPERLIPIDEMIA 05/29/2007  . Vitamin D deficiency 01/11/2007  . FIBROMYALGIA 01/11/2007  . Vitamin B 12 deficiency 11/13/2006  . Essential hypertension 11/13/2006  . GERD 11/13/2006    Patient's Medications  New Prescriptions   No medications on file  Previous Medications   B COMPLEX VITAMINS (B COMPLEX 100 PO)    Take 100 mg by mouth daily.    BISACODYL (DULCOLAX) 5 MG EC TABLET    Take 5 mg by mouth daily as needed for moderate constipation. Take 1-2 at hs   CALCIUM CARBONATE (CALCIUM 600 PO)    Take 600 mg by mouth 2 (two) times daily.    CARBOXYMETHYLCELLULOSE (REFRESH PLUS) 0.5 % SOLN    Place 1 drop into both eyes 3 (three) times daily as needed.    CETIRIZINE (ZYRTEC) 10 MG TABLET    Take 10 mg by mouth daily.   CHOLECALCIFEROL (VITAMIN D) 2000 UNITS TABLET    Take 2,000 Units by mouth daily.     CITALOPRAM (CELEXA) 40 MG TABLET    TAKE 1 TABLET BY MOUTH  DAILY   COENZYME Q-10 100 MG CAPSULE    Take 100 mg by mouth daily.   DICLOFENAC SODIUM (VOLTAREN) 1 % GEL    Apply 4 g  topically 4 (four) times daily as needed.   DOCUSATE SODIUM (COLACE) 100 MG CAPSULE    Take 400 mg by mouth at bedtime.    GABAPENTIN (NEURONTIN) 300 MG CAPSULE    Take 1 capsule (300 mg total) by mouth 3 (three) times daily.   GRAPE SEED EXTRACT PO    Take by mouth. 1 tablet daily   LEVOTHYROXINE (SYNTHROID, LEVOTHROID) 50 MCG TABLET    TAKE 1 TABLET BY MOUTH  DAILY   MAGNESIUM GLYCINATE PLUS PO    Take 400 mg by mouth at bedtime.   METHOCARBAMOL (ROBAXIN) 500 MG TABLET    TAKE ONE (1) TABLET BY MOUTH EVERY EIGHTHOURS AS NEEDED FOR MUSCLE SPASMS   MIRTAZAPINE (REMERON) 15 MG TABLET    TAKE 1 TABLET BY MOUTH AT  BEDTIME   MORPHINE (MS CONTIN) 15 MG 12 HR TABLET    Take 1 tablet (15 mg total) by mouth 3 (three) times daily.   NON FORMULARY    Daith piercing in right ear for headaches   ONDANSETRON (ZOFRAN) 4 MG TABLET    Take 1 tablet (4 mg total) by mouth every 8 (eight) hours as needed for nausea or vomiting.   OXYBUTYNIN (DITROPAN-XL) 10 MG 24 HR TABLET    TAKE ONE TABLET BY MOUTH EVERY NIGHT AT BEDTIME   RABEPRAZOLE (ACIPHEX) 20 MG TABLET    TAKE  1 TABLET BY MOUTH TWICE A DAY   TURMERIC PO    Take 800 mg by mouth daily.   Modified Medications   No medications on file  Discontinued Medications   No medications on file    Subjective: [name] is in for her routine follow-up visit.  She tells me that the wound on her left foot continues to improve. She has been off of all antibiotics since October 27.  She does not have any significant wound drainage.  She has no pain.  She has not had any fever, chills or sweats.  She tells me that Dr. Amalia Hailey is considering a skin graft.  Review of Systems: Review of Systems  Constitutional: Negative for chills, diaphoresis and fever.  Gastrointestinal: Negative for abdominal pain, diarrhea, nausea and vomiting.  Musculoskeletal: Negative for joint pain.    Past Medical History:  Diagnosis Date  . Allergy   . Anemia   . Arthritis   . B12 deficiency     . Complication of anesthesia    slow to wake up   . Depression   . Diabetes mellitus without complication ([place])    type II - diet controlled ; A1C 6.5 01/2017  . Fibromyalgia   . Generalized hyperhidrosis 04/17/2014  . GERD (gastroesophageal reflux disease)   . Heart murmur    Aortic Sclerosis  . Hyperlipemia   . HYPOTHYROIDISM 05/29/2007  . Irritable bowel syndrome   . Migraines    hx of   . Oxygen deficiency    2 L at bedtime  . 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 PONV (postoperative nausea and vomiting)   . PVC (premature ventricular contraction)   . Type II or unspecified type diabetes mellitus with neurological manifestations, not stated as uncontrolled(250.60) 04/07/2013  . Unspecified essential hypertension    denies htn on 05/09/16    Social History   Tobacco Use  . Smoking status: Never Smoker  . Smokeless tobacco: Never Used  Substance Use Topics  . Alcohol use: Yes    Alcohol/week: 0.0 standard drinks    Comment: rare  . Drug use: No    Family History  Problem Relation Age of Onset  . Breast cancer Mother   . Diabetes Mother   . Heart disease Mother   . Allergies Mother   . Diabetes Father   . Heart disease Father   . Emphysema Father   . Allergies Father   . Stomach cancer Maternal Uncle   . Breast cancer Paternal Aunt   . Colon cancer Neg Hx     Allergies  Allergen Reactions  . Rizatriptan Benzoate Anaphylaxis    Throat and Tongue Swelling Closed  . Sumatriptan Other (See Comments)    Throat closed & Tongue Swelling  . Tizanidine Shortness Of Breath  . Aleve [Naproxen Sodium] Hives  . Clarithromycin Hives  . Cyclobenzaprine Hcl     REACTION: THROAT CLOSED,TOUNGE SWELLING  . Doxycycline Diarrhea and Nausea And Vomiting    Reaction: unknown  . Duloxetine Nausea Only  . Keflex [Cephalexin] Itching    Sores  . Metronidazole Diarrhea and Nausea Only  . Oxycodone Hcl Diarrhea    Vomiting  . Oxycontin  [Oxycodone Hcl] Nausea And Vomiting  . Penicillins     TOLERATES CEPHALOSPORINS Reaction: unknown Has patient had a PCN reaction causing immediate rash, facial/tongue/throat swelling, SOB or lightheadedness with hypotension: {unknown Has patient had a PCN reaction causing severe rash involving  mucus membranes or skin necrosis: {unknown Has patient had a PCN reaction that required hospitalization {unknown Has patient had a PCN reaction occurring within the last 10 years: {no If all of the above answers are "NO", then may proceed with Cephalosporin use.  . Rosuvastatin     REACTION: MUSCLE ACHES  . Septra [Sulfamethoxazole-Trimethoprim] Other (See Comments)    Rash and itching  . Venlafaxine Hives  . Voltaren [Diclofenac Sodium] Hives    oral  . Tramadol Diarrhea, Nausea And Vomiting and Palpitations    Objective: Vitals:   05/09/18 1536  BP: 125/75  Pulse: 69  Temp: 98.7 F (37.1 C)  Weight: 226 lb (102.5 kg)   Body mass index is 36.48 kg/m.  Physical Exam  Constitutional: She is oriented to person, place, and time.  She is in good spirits.  Musculoskeletal:  Her left foot wound is definitely improving. There is no evidence of active infection.  Neurological: She is alert and oriented to person, place, and time.  Psychiatric: She has a normal mood and affect.      Lab Results    Problem List Items Addressed This Visit      High   Postoperative wound infection    Her infection has resolved with good wound care and recent treatment with clindamycin.  She can follow-up here as needed.          Michel Bickers, MD [facility] for [facility] [phone] pager   [phone] cell 05/09/2018, 3:50 PM

## 2018-05-09 NOTE — Assessment & Plan Note (Signed)
Her infection has resolved with good wound care and recent treatment with clindamycin.  She can follow-up here as needed.

## 2018-05-11 ENCOUNTER — Encounter: Payer: Self-pay | Admitting: Podiatry

## 2018-05-11 ENCOUNTER — Ambulatory Visit (INDEPENDENT_AMBULATORY_CARE_PROVIDER_SITE_OTHER): Payer: Medicare Other | Admitting: Podiatry

## 2018-05-11 DIAGNOSIS — I70245 Atherosclerosis of native arteries of left leg with ulceration of other part of foot: Secondary | ICD-10-CM | POA: Diagnosis not present

## 2018-05-11 DIAGNOSIS — L97522 Non-pressure chronic ulcer of other part of left foot with fat layer exposed: Secondary | ICD-10-CM

## 2018-05-11 DIAGNOSIS — E0843 Diabetes mellitus due to underlying condition with diabetic autonomic (poly)neuropathy: Secondary | ICD-10-CM | POA: Diagnosis not present

## 2018-05-11 DIAGNOSIS — Z9889 Other specified postprocedural states: Secondary | ICD-10-CM

## 2018-05-11 DIAGNOSIS — T8131XD Disruption of external operation (surgical) wound, not elsewhere classified, subsequent encounter: Secondary | ICD-10-CM

## 2018-05-14 DIAGNOSIS — E11621 Type 2 diabetes mellitus with foot ulcer: Secondary | ICD-10-CM | POA: Diagnosis not present

## 2018-05-14 NOTE — Progress Notes (Signed)
   Subjective:  Patient presents today for follow up evaluation of a postsurgical wound dehiscence status post bunionectomy to the left foot. DOS: 02/16/2018. She states she is doing better and the wound is slowly healing. She reports continue drainage and swelling of the toes. She denies any pain or modifying factors. She has been using silver wound gel for treatment. Patient is here for further evaluation and treatment.   Past Medical History:  Diagnosis Date  . Allergy   . Anemia   . Arthritis   . B12 deficiency   . Complication of anesthesia    slow to wake up   . Depression   . Diabetes mellitus without complication ([place])    type II - diet controlled ; A1C 6.5 01/2017  . Fibromyalgia   . Generalized hyperhidrosis 04/17/2014  . GERD (gastroesophageal reflux disease)   . Heart murmur    Aortic Sclerosis  . Hyperlipemia   . HYPOTHYROIDISM 05/29/2007  . Irritable bowel syndrome   . Migraines    hx of   . Oxygen deficiency    2 L at bedtime  . 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 PONV (postoperative nausea and vomiting)   . PVC (premature ventricular contraction)   . Type II or unspecified type diabetes mellitus with neurological manifestations, not stated as uncontrolled(250.60) 04/07/2013  . Unspecified essential hypertension    denies htn on 05/09/16     Objective/Physical Exam Neurovascular status intact. Pedal pulses are palpable. Neurological sensation intact. Significant amount of dehiscence of the Austin bunionectomy site measuring approximately 4.3 x 2.7 x 0.3 cm (LxWxD).  There is some improvement in the size of the wound today. To the surgical wound dehiscence there is some stable eschar noted around the area likely consistent with a small vessel disease. No malodor noted. There continues to be no exposed bone, joint, or hardware noted.  There continues to be exposed EHL tendon. Negative for any significant erythema or edema at the moment.  Incision site overlying the second MTPJ left foot appears to have a dry stable eschar overlying the incision site. No drainage noted. No malodor noted.  Assessment: 1. s/p Austin bunionectomy and metatarsal osteotomy second digit left foot.. DOS: 02/16/2018 2. Surgical wound dehiscence possibly secondary to small vessel disease.  Plan of Care:  1. Patient was evaluated.  2. Medically necessary excisional debridement including subcutaneous tissue was performed using a tissue nipper and a chisel blade. Excisional debridement of all the necrotic nonviable tissue down to healthy bleeding viable tissue was performed with post-debridement measurements same as pre-. 3. The wound was cleansed and Aquacel Ag applied. 4. Continue using silver wound gel.  5. Order placed to [facility] for Aquacel Ag.  6. Authorization pending for Theraskin Skin Graft.  7. Return to clinic in 4 weeks.     Edrick Kins, DPM [facility]  Dr. Edrick Kins, [address]                                        [place], [place] [address]                Office [phone]  Fax [phone]

## 2018-05-15 ENCOUNTER — Encounter: Payer: Medicare Other | Attending: Physical Medicine & Rehabilitation

## 2018-05-15 ENCOUNTER — Encounter: Payer: Self-pay | Admitting: Physical Medicine & Rehabilitation

## 2018-05-15 ENCOUNTER — Ambulatory Visit: Payer: Medicare Other | Admitting: Physical Medicine & Rehabilitation

## 2018-05-15 VITALS — BP 126/75 | HR 71 | Ht 66.0 in | Wt 228.0 lb

## 2018-05-15 DIAGNOSIS — M961 Postlaminectomy syndrome, not elsewhere classified: Secondary | ICD-10-CM

## 2018-05-15 DIAGNOSIS — Z79899 Other long term (current) drug therapy: Secondary | ICD-10-CM | POA: Diagnosis not present

## 2018-05-15 DIAGNOSIS — G894 Chronic pain syndrome: Secondary | ICD-10-CM | POA: Insufficient documentation

## 2018-05-15 DIAGNOSIS — Z5181 Encounter for therapeutic drug level monitoring: Secondary | ICD-10-CM | POA: Diagnosis not present

## 2018-05-15 MED ORDER — MORPHINE SULFATE ER 15 MG PO TBCR: 15.0000 mg | EXTENDED_RELEASE_TABLET | Freq: Three times a day (TID) | ORAL | 0 refills | Status: DC

## 2018-05-15 NOTE — Patient Instructions (Signed)
Sacroiliac (SI) Joint Injection Patient Information  Description: The sacroiliac joint connects the sacrum (very low back and tailbone) to the ilium (a pelvic bone which also forms half of the hip joint).  Normally this joint experiences very little motion.  When this joint becomes inflamed or unstable low back and or hip and pelvis pain may result.  Injection of this joint with local anesthetics (numbing medicines) and steroids can provide diagnostic information and reduce pain.  This injection is performed with the aid of x-ray guidance into the tailbone area while you are lying on your stomach.   You may experience an electrical sensation down the leg while this is being done.  You may also experience numbness.  We also may ask if we are reproducing your normal pain during the injection.  Conditions which may be treated SI injection:   Low back, buttock, hip or leg pain  Possible side effects:   Bleeding from needle site  Infection (rare, may require surgery)  Nerve injury (rare)  Numbness & tingling (temporary)  A brief convulsion or seizure  Light-headedness (temporary)  Pain at injection site (several days)  Decreased blood pressure (temporary)  Weakness in the leg (temporary)   Call if you experience:   New onset weakness or numbness of an extremity below the injection site that last more than 8 hours.  Hives or difficulty breathing ( go to the emergency room)  Inflammation or drainage at the injection site  Any new symptoms which are concerning to you  Please note:  Although the local anesthetic injected can often make your back/ hip/ buttock/ leg feel good for several hours after the injections, the pain will likely return.  It takes 3-7 days for steroids to work in the sacroiliac area.  You may not notice any pain relief for at least that one week.  If effective, we will often do a series of three injections spaced 3-6 weeks apart to maximally decrease your  pain.  After the initial series, we generally will wait some months before a repeat injection of the same type.  If you have any questions, please call [phone] [facility]

## 2018-05-15 NOTE — Progress Notes (Signed)
Subjective:    Patient ID: [name], female    DOB: 19-May-1951, 67 y.o.   MRN: [id number]  HPI  CC:  Low back pain  Initially Right buttocks pain  Has tried Ibuprofen and tylenol together with minimal relief Pain increased with walking, pt has some pain in bed as well if she turns. No associated numbness and tingling that is new, has hx of chronic neuropathy, has been taking gabapentin 300mg  TID Methocarbamol 750mg  TID Her chronic pain med Had bunion surgery in Left great toe, limping Treated for infection by ID and is now clear per ID note on 05/09/18 Pain Inventory Average Pain 7 Pain Right Now 7 My pain is constant, sharp, burning, stabbing and aching  In the last 24 hours, has pain interfered with the following? General activity 8 Relation with others 8 Enjoyment of life 8 What TIME of day is your pain at its worst? all Sleep (in general) Fair  Pain is worse with: bending, sitting, standing and some activites Pain improves with: medication Relief from Meds: 4  Mobility walk without assistance ability to climb steps?  yes do you drive?  yes  Function retired I need assistance with the following:  meal prep, household duties and shopping  Neuro/Psych bladder control problems weakness numbness tremor tingling trouble walking spasms depression  Prior Studies Any changes since last visit?  no  Physicians involved in your care Any changes since last visit?  no   Family History  Problem Relation Age of Onset  . Breast cancer Mother   . Diabetes Mother   . Heart disease Mother   . Allergies Mother   . Diabetes Father   . Heart disease Father   . Emphysema Father   . Allergies Father   . Stomach cancer Maternal Uncle   . Breast cancer Paternal Aunt   . Colon cancer Neg Hx    Social History   Socioeconomic History  . Marital status: Married    Spouse name: Not on file  . Number of children: Not on file  . Years of education: Not on file  .  Highest education level: Not on file  Occupational History  . Occupation: Medical laboratory scientific officer  . Financial resource strain: Not on file  . Food insecurity:    Worry: Not on file    Inability: Not on file  . Transportation needs:    Medical: Not on file    Non-medical: Not on file  Tobacco Use  . Smoking status: Never Smoker  . Smokeless tobacco: Never Used  Substance and Sexual Activity  . Alcohol use: Yes    Alcohol/week: 0.0 standard drinks    Comment: rare  . Drug use: No  . Sexual activity: Not on file  Lifestyle  . Physical activity:    Days per week: Not on file    Minutes per session: Not on file  . Stress: Not on file  Relationships  . Social connections:    Talks on phone: Not on file    Gets together: Not on file    Attends religious service: Not on file    Active member of club or organization: Not on file    Attends meetings of clubs or organizations: Not on file    Relationship status: Not on file  Other Topics Concern  . Not on file  Social History Narrative  . Not on file   Past Surgical History:  Procedure Laterality Date  . [place]  STUDY N/A 01/04/2016   Procedure: 24 HOUR PH STUDY;  Surgeon: Manus Gunning, MD;  Location: [facility];  Service: Gastroenterology;  Laterality: N/A;  . ABDOMINAL HYSTERECTOMY  1988  . ADENOIDECTOMY    . ANTERIOR CERVICAL DECOMP/DISCECTOMY FUSION  2009, 1993   first procedure in ~ 1993/Dr Botero  . APPENDECTOMY  1988  . BACK SURGERY    . CHOLECYSTECTOMY  2005  . ESOPHAGEAL MANOMETRY N/A 01/04/2016   Procedure: ESOPHAGEAL MANOMETRY (EM) 24 HR PH;  Surgeon: Manus Gunning, MD;  Location: [facility];  Service: Gastroenterology;  Laterality: N/A;  . ESOPHAGOGASTRODUODENOSCOPY (EGD) WITH PROPOFOL N/A 05/17/2016   Procedure: ESOPHAGOGASTRODUODENOSCOPY (EGD) WITH PROPOFOL;  Surgeon: Manus Gunning, MD;  Location: [facility];  Service: Gastroenterology;  Laterality: N/A;  . NASAL SINUS  SURGERY  2006  . SAVORY DILATION N/A 05/17/2016   Procedure: SAVORY DILATION;  Surgeon: Manus Gunning, MD;  Location: [facility];  Service: Gastroenterology;  Laterality: N/A;  . TONSILLECTOMY AND ADENOIDECTOMY    . TRANSTHORACIC ECHOCARDIOGRAM  2009   Normal LV size and function. EF 60-65%. No regional wall motion and amount is. Mild aortic sclerosis, no stenosis  . TUBAL LIGATION  1979   Past Medical History:  Diagnosis Date  . Allergy   . Anemia   . Arthritis   . B12 deficiency   . Complication of anesthesia    slow to wake up   . Depression   . Diabetes mellitus without complication ([place])    type II - diet controlled ; A1C 6.5 01/2017  . Fibromyalgia   . Generalized hyperhidrosis 04/17/2014  . GERD (gastroesophageal reflux disease)   . Heart murmur    Aortic Sclerosis  . Hyperlipemia   . HYPOTHYROIDISM 05/29/2007  . Irritable bowel syndrome   . Migraines    hx of   . Oxygen deficiency    2 L at bedtime  . 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 PONV (postoperative nausea and vomiting)   . PVC (premature ventricular contraction)   . Type II or unspecified type diabetes mellitus with neurological manifestations, not stated as uncontrolled(250.60) 04/07/2013  . Unspecified essential hypertension    denies htn on 05/09/16   BP 126/75   Pulse 71   Ht 5\' 6"  (1.676 m)   Wt 228 lb (103.4 kg)   SpO2 97%   BMI 36.80 kg/m   Opioid Risk Score:   Fall Risk Score:  `1  Depression screen PHQ 2/9  Depression screen [employer] 2/9 04/24/2018 01/10/2018 11/24/2017 03/27/2017 02/28/2017 01/25/2017 01/02/2017  Decreased Interest 0 2 1 0 1 1 0  Down, Depressed, Hopeless 0 0 1 0 1 1 0  PHQ - 2 Score 0 2 2 0 2 2 0  Altered sleeping - 0 - - - - -  Tired, decreased energy - 1 - - - - -  Change in appetite - 0 - - - - -  Feeling bad or failure about yourself  - 0 - - - - -  Trouble concentrating - 1 - - - - -  Moving slowly or fidgety/restless - 0 - - - - -    Suicidal thoughts - 0 - - - - -  PHQ-9 Score - 4 - - - - -  Difficult doing work/chores - Not difficult at all Not difficult at all - - - -  Some recent data might be hidden  Review of Systems  Constitutional: Negative.   HENT: Negative.   Eyes: Negative.   Respiratory: Negative.   Cardiovascular: Negative.   Gastrointestinal: Positive for constipation.  Endocrine: Negative.   Genitourinary: Positive for difficulty urinating.  Musculoskeletal: Positive for arthralgias, gait problem, joint swelling and myalgias.  Skin: Negative.   Allergic/Immunologic: Negative.   Neurological: Positive for tremors, weakness and numbness.  Hematological: Negative.   Psychiatric/Behavioral: Positive for dysphoric mood.  All other systems reviewed and are negative.      Objective:   Physical Exam  Constitutional: She appears well-developed and well-nourished. No distress.  HENT:  Head: Normocephalic and atraumatic.  Eyes: Pupils are equal, round, and reactive to light.  Neck: Normal range of motion.  Skin: She is not diaphoretic.  Psychiatric: She has a normal mood and affect.  Nursing note and vitals reviewed.         Assessment & Plan:   1.  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  At this point would rec Sacroiliac injection under fluoro   2.  Cervical post lami syndrome cont  Morphine ER 15mg  TID PMP aware website reviewed no red flags Controlled substance agreement has been signed was last reviewed about 1 month ago Last oral swab  03/21/2018 did not demonstrate Opiate, will need to retest with Urine

## 2018-05-21 ENCOUNTER — Encounter: Payer: Self-pay | Admitting: Physical Medicine & Rehabilitation

## 2018-05-21 ENCOUNTER — Ambulatory Visit: Payer: Medicare Other | Admitting: Physical Medicine & Rehabilitation

## 2018-05-21 VITALS — BP 121/80 | HR 83 | Ht 66.0 in | Wt 220.0 lb

## 2018-05-21 DIAGNOSIS — G8929 Other chronic pain: Secondary | ICD-10-CM | POA: Diagnosis not present

## 2018-05-21 DIAGNOSIS — M533 Sacrococcygeal disorders, not elsewhere classified: Secondary | ICD-10-CM | POA: Diagnosis not present

## 2018-05-21 DIAGNOSIS — Z5181 Encounter for therapeutic drug level monitoring: Secondary | ICD-10-CM | POA: Diagnosis not present

## 2018-05-21 DIAGNOSIS — Z79899 Other long term (current) drug therapy: Secondary | ICD-10-CM | POA: Diagnosis not present

## 2018-05-21 DIAGNOSIS — G894 Chronic pain syndrome: Secondary | ICD-10-CM | POA: Diagnosis not present

## 2018-05-21 NOTE — Progress Notes (Signed)
  PROCEDURE RECORD [facility]   Name: TREY BEBEE DOB:January 19, 1951 MRN: [id number]  Date:05/21/2018  Physician: Alysia Penna, MD    Nurse/CMA: Truman Hayward CMA / Vander Kueker CMA  Allergies:  Allergies  Allergen Reactions  . Rizatriptan Benzoate Anaphylaxis    Throat and Tongue Swelling Closed  . Sumatriptan Other (See Comments)    Throat closed & Tongue Swelling  . Tizanidine Shortness Of Breath  . Aleve [Naproxen Sodium] Hives  . Clarithromycin Hives  . Cyclobenzaprine Hcl     REACTION: THROAT CLOSED,TOUNGE SWELLING  . Doxycycline Diarrhea and Nausea And Vomiting    Reaction: unknown  . Duloxetine Nausea Only  . Keflex [Cephalexin] Itching    Sores  . Metronidazole Diarrhea and Nausea Only  . Oxycodone Hcl Diarrhea    Vomiting  . Oxycontin [Oxycodone Hcl] Nausea And Vomiting  . Penicillins     TOLERATES CEPHALOSPORINS Reaction: unknown Has patient had a PCN reaction causing immediate rash, facial/tongue/throat swelling, SOB or lightheadedness with hypotension: {unknown Has patient had a PCN reaction causing severe rash involving mucus membranes or skin necrosis: {unknown Has patient had a PCN reaction that required hospitalization {unknown Has patient had a PCN reaction occurring within the last 10 years: {no If all of the above answers are "NO", then may proceed with Cephalosporin use.  . Rosuvastatin     REACTION: MUSCLE ACHES  . Septra [Sulfamethoxazole-Trimethoprim] Other (See Comments)    Rash and itching  . Venlafaxine Hives  . Voltaren [Diclofenac Sodium] Hives    oral  . Tramadol Diarrhea, Nausea And Vomiting and Palpitations    Consent Signed: Yes.    Is patient diabetic? No.  CBG today? NA  Pregnant: No. LMP: No LMP recorded. Patient has had a hysterectomy. (age 74-55)  Anticoagulants: no Anti-inflammatory: yes (motrin - this am) Antibiotics: no  Procedure: Right Sacroiliac injection Position: Prone   Start  Time:10:35am  End Time: 10:41am Fluoro Time: 17  RN/CMA Dedria Endres CMA Lee, CMA    Time 1003am 10:46am    BP 121/80 126/85    Pulse 83 79    Respirations 16 16    O2 Sat 97 97    S/S 6 6    Pain Level 8/10 /10     D/C home with Husband, patient A & O X 3, D/C instructions reviewed, and sits independently.

## 2018-05-21 NOTE — Progress Notes (Signed)

## 2018-05-21 NOTE — Patient Instructions (Signed)
Sacroiliac injection was performed today. A combination of a naming medicine plus a cortisone medicine was injected. The injection was done under x-ray guidance. This procedure has been performed to help reduce low back and buttocks pain as well as potentially hip pain. The duration of this injection is variable lasting from hours to  Months. It may repeated if needed. 

## 2018-05-24 DIAGNOSIS — J9610 Chronic respiratory failure, unspecified whether with hypoxia or hypercapnia: Secondary | ICD-10-CM | POA: Diagnosis not present

## 2018-06-04 ENCOUNTER — Other Ambulatory Visit: Payer: Self-pay | Admitting: Family Medicine

## 2018-06-04 DIAGNOSIS — Z1231 Encounter for screening mammogram for malignant neoplasm of breast: Secondary | ICD-10-CM

## 2018-06-05 ENCOUNTER — Encounter: Payer: Self-pay | Admitting: Podiatry

## 2018-06-05 ENCOUNTER — Ambulatory Visit (INDEPENDENT_AMBULATORY_CARE_PROVIDER_SITE_OTHER): Payer: Medicare Other | Admitting: Podiatry

## 2018-06-05 DIAGNOSIS — E0843 Diabetes mellitus due to underlying condition with diabetic autonomic (poly)neuropathy: Secondary | ICD-10-CM

## 2018-06-05 DIAGNOSIS — I70245 Atherosclerosis of native arteries of left leg with ulceration of other part of foot: Secondary | ICD-10-CM

## 2018-06-05 DIAGNOSIS — L97522 Non-pressure chronic ulcer of other part of left foot with fat layer exposed: Secondary | ICD-10-CM | POA: Diagnosis not present

## 2018-06-06 NOTE — Progress Notes (Signed)
   Subjective:  Patient presents today for follow up evaluation of a postsurgical wound dehiscence status post bunionectomy to the left foot. DOS: 02/16/2018. She states she is doing well. She denies any significant pain or modifying factors. She has been using Aquacel Ag as directed. She denies any new complaints. Patient is here for further evaluation and treatment.   Past Medical History:  Diagnosis Date  . Allergy   . Anemia   . Arthritis   . B12 deficiency   . Complication of anesthesia    slow to wake up   . Depression   . Diabetes mellitus without complication ([place])    type II - diet controlled ; A1C 6.5 01/2017  . Fibromyalgia   . Generalized hyperhidrosis 04/17/2014  . GERD (gastroesophageal reflux disease)   . Heart murmur    Aortic Sclerosis  . Hyperlipemia   . HYPOTHYROIDISM 05/29/2007  . Irritable bowel syndrome   . Migraines    hx of   . Oxygen deficiency    2 L at bedtime  . 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 PONV (postoperative nausea and vomiting)   . PVC (premature ventricular contraction)   . Type II or unspecified type diabetes mellitus with neurological manifestations, not stated as uncontrolled(250.60) 04/07/2013  . Unspecified essential hypertension    denies htn on 05/09/16     Objective/Physical Exam Neurovascular status intact. Pedal pulses are palpable. Neurological sensation intact. Significant amount of dehiscence of the [place] bunionectomy site measuring approximately 3.0 x 1.5 x 0.3 cm (LxWxD).  There is some improvement in the size of the wound today. To the surgical wound dehiscence there is some stable eschar noted around the area likely consistent with a small vessel disease. No malodor noted. There continues to be no exposed bone, joint, or hardware noted.  There continues to be exposed EHL tendon. Negative for any significant erythema or edema at the moment. Incision site overlying the second MTPJ left foot  appears to have a dry stable eschar overlying the incision site. No drainage noted. No malodor noted.  Assessment: 1. s/p [place] bunionectomy and metatarsal osteotomy second digit left foot.. DOS: 02/16/2018 2. Surgical wound dehiscence possibly secondary to small vessel disease.  Plan of Care:  1. Patient was evaluated.  2. Medically necessary excisional debridement including muscle and deep fascial tissue was performed using a tissue nipper and a chisel blade. Excisional debridement of all the necrotic nonviable tissue down to healthy bleeding viable tissue was performed with post-debridement measurements same as pre-. 3. The wound was cleansed and dry sterile dressing applied. 4. Continue using Aqaucel Ag daily with a dry sterile dressing.  5. Return to clinic in 3 weeks.    Edrick Kins, DPM [facility]  Dr. Edrick Kins, [address]                                        [place], [place] [address]                Office [phone]  Fax [phone]

## 2018-06-13 ENCOUNTER — Encounter: Payer: Self-pay | Admitting: Cardiology

## 2018-06-13 ENCOUNTER — Encounter: Payer: Medicare Other | Attending: Physical Medicine & Rehabilitation | Admitting: Registered Nurse

## 2018-06-13 ENCOUNTER — Encounter: Payer: Self-pay | Admitting: Registered Nurse

## 2018-06-13 ENCOUNTER — Ambulatory Visit: Payer: Medicare Other | Admitting: Cardiology

## 2018-06-13 VITALS — BP 130/78 | HR 70 | Resp 14 | Ht 66.0 in | Wt 228.0 lb

## 2018-06-13 VITALS — BP 134/72 | HR 67 | Ht 66.0 in | Wt 228.0 lb

## 2018-06-13 DIAGNOSIS — G894 Chronic pain syndrome: Secondary | ICD-10-CM | POA: Insufficient documentation

## 2018-06-13 DIAGNOSIS — M961 Postlaminectomy syndrome, not elsewhere classified: Secondary | ICD-10-CM | POA: Diagnosis not present

## 2018-06-13 DIAGNOSIS — E782 Mixed hyperlipidemia: Secondary | ICD-10-CM

## 2018-06-13 DIAGNOSIS — M7061 Trochanteric bursitis, right hip: Secondary | ICD-10-CM

## 2018-06-13 DIAGNOSIS — R000 Tachycardia, unspecified: Secondary | ICD-10-CM

## 2018-06-13 DIAGNOSIS — M546 Pain in thoracic spine: Secondary | ICD-10-CM | POA: Diagnosis not present

## 2018-06-13 DIAGNOSIS — I10 Essential (primary) hypertension: Secondary | ICD-10-CM | POA: Diagnosis not present

## 2018-06-13 DIAGNOSIS — M797 Fibromyalgia: Secondary | ICD-10-CM | POA: Diagnosis not present

## 2018-06-13 DIAGNOSIS — Z5181 Encounter for therapeutic drug level monitoring: Secondary | ICD-10-CM | POA: Insufficient documentation

## 2018-06-13 DIAGNOSIS — M25562 Pain in left knee: Secondary | ICD-10-CM

## 2018-06-13 DIAGNOSIS — M533 Sacrococcygeal disorders, not elsewhere classified: Secondary | ICD-10-CM

## 2018-06-13 DIAGNOSIS — M25561 Pain in right knee: Secondary | ICD-10-CM

## 2018-06-13 DIAGNOSIS — M545 Low back pain, unspecified: Secondary | ICD-10-CM

## 2018-06-13 DIAGNOSIS — Z79899 Other long term (current) drug therapy: Secondary | ICD-10-CM

## 2018-06-13 DIAGNOSIS — Z01818 Encounter for other preprocedural examination: Secondary | ICD-10-CM

## 2018-06-13 DIAGNOSIS — I208 Other forms of angina pectoris: Secondary | ICD-10-CM | POA: Diagnosis not present

## 2018-06-13 DIAGNOSIS — G8929 Other chronic pain: Secondary | ICD-10-CM

## 2018-06-13 MED ORDER — METHOCARBAMOL 500 MG PO TABS: ORAL_TABLET | ORAL | 3 refills | Status: DC

## 2018-06-13 MED ORDER — METOPROLOL TARTRATE 50 MG PO TABS: ORAL_TABLET | ORAL | 0 refills | Status: DC

## 2018-06-13 MED ORDER — NITROGLYCERIN 0.4 MG SL SUBL: 0.4000 mg | SUBLINGUAL_TABLET | SUBLINGUAL | 3 refills | Status: DC | PRN

## 2018-06-13 MED ORDER — MORPHINE SULFATE ER 15 MG PO TBCR: 15.0000 mg | EXTENDED_RELEASE_TABLET | Freq: Three times a day (TID) | ORAL | 0 refills | Status: DC

## 2018-06-13 NOTE — Progress Notes (Signed)
PCP: Owens Loffler, MD  Clinic Note: Chief Complaint  Patient presents with  . Follow-up    chest pain & palpitations    HPI: [name] is a 67 y.o. female who is being seen today for the evaluation of chest discomfort and palpitations at the request of Copland, Frederico Hamman, MD.  [name] was last seen by me on 06/13/2016 - was a f/u visit after evaluation for atypical CP. Plan was return PRN.  Recent Hospitalizations: None  Studies Personally Reviewed - (if available, images/films reviewed: From Epic Chart or Care Everywhere)  No new studies  Interval History: [name] presents here today with complaints of off and on chest pressure they can happen pretty much anytime during the day.  Comes and goes.  But she feels a general pressure all the time.  She says it can in increase with exertion, but does not always.  About a month ago she had an episode where she had a prolonged episode of chest discomfort she felt clammy and lightheaded and dizzy.  Had nausea without vomiting.  She felt tired and worn out for a while.  It did resolve spontaneously, but she just did not feel well for the rest of the day.  She is never had another episode like that, but has had the off and on symptoms since.  Nothing is prolonged.  When that episode occurred, she did call EMS, but I do not have any details of what happened after that.  She had foot surgery back in August for bunionectomy, and so she has been taking a long time getting back into an exercise regimen.  Not really ready to do full activity.  She is therefore put on a little bit of weight and is a little more deconditioned.  She denies any PND orthopnea, but does have some ankle edema. She feels intermittent episodes of rapid heartbeats or for palpitations that can last a few minutes.  Enough to make her feel lightheaded and dizzy, but no near syncope or syncope.  No TIA or amaurosis fugax.  Cardiovascular review of symptoms:: positive for  - chest pain, edema, irregular heartbeat, palpitations and Occasional dizziness negative for - dyspnea on exertion, orthopnea, paroxysmal nocturnal dyspnea, shortness of breath or TIA/amaurosis fugax, syncope/near syncope  No melena, hematochezia, hematuria, or epstaxis. No claudication.  ROS: A comprehensive was performed.  Pertinent symptoms noted in HPI Review of Systems  Constitutional: Positive for malaise/fatigue (She is less energetic and less exercise tolerance since having her foot surgery.). Negative for weight loss.  Respiratory: Negative for cough and wheezing.   Gastrointestinal: Positive for nausea (Occasionally happens with her chest discomfort).  Musculoskeletal: Negative for falls and joint pain.       Foot pain is finally starting to be less prominent and allowing her to be more active.  Neurological: Positive for headaches. Negative for focal weakness and weakness.  All other systems reviewed and are negative.   I have reviewed and (if needed) personally updated the patient's problem list, medications, allergies, past medical and surgical history, social and family history.   Past Medical History:  Diagnosis Date  . Allergy   . Anemia   . Arthritis   . B12 deficiency   . Complication of anesthesia    slow to wake up   . Depression   . Diabetes mellitus without complication ([place])    type II - diet controlled ; A1C 6.5 01/2017  . Fibromyalgia   . Generalized hyperhidrosis 04/17/2014  .  GERD (gastroesophageal reflux disease)   . Heart murmur    Aortic Sclerosis  . Hyperlipemia   . HYPOTHYROIDISM 05/29/2007  . Irritable bowel syndrome   . Migraines    hx of   . Oxygen deficiency    2 L at bedtime  . 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 PONV (postoperative nausea and vomiting)   . PVC (premature ventricular contraction)   . Type II or unspecified type diabetes mellitus with neurological manifestations, not stated as  uncontrolled(250.60) 04/07/2013  . Unspecified essential hypertension    denies htn on 05/09/16    Past Surgical History:  Procedure Laterality Date  . [place] STUDY N/A 01/04/2016   Procedure: [place] STUDY;  Surgeon: Manus Gunning, MD;  Location: [facility];  Service: Gastroenterology;  Laterality: N/A;  . ABDOMINAL HYSTERECTOMY  1988  . ADENOIDECTOMY    . ANTERIOR CERVICAL DECOMP/DISCECTOMY FUSION  2009, 1993   first procedure in ~ 1993/Dr Botero  . APPENDECTOMY  1988  . BACK SURGERY    . CHOLECYSTECTOMY  2005  . ESOPHAGEAL MANOMETRY N/A 01/04/2016   Procedure: ESOPHAGEAL MANOMETRY (EM) 24 HR PH;  Surgeon: Manus Gunning, MD;  Location: [facility];  Service: Gastroenterology;  Laterality: N/A;  . ESOPHAGOGASTRODUODENOSCOPY (EGD) WITH PROPOFOL N/A 05/17/2016   Procedure: ESOPHAGOGASTRODUODENOSCOPY (EGD) WITH PROPOFOL;  Surgeon: Manus Gunning, MD;  Location: [facility];  Service: Gastroenterology;  Laterality: N/A;  . NASAL SINUS SURGERY  2006  . SAVORY DILATION N/A 05/17/2016   Procedure: SAVORY DILATION;  Surgeon: Manus Gunning, MD;  Location: [facility];  Service: Gastroenterology;  Laterality: N/A;  . TONSILLECTOMY AND ADENOIDECTOMY    . TRANSTHORACIC ECHOCARDIOGRAM  2009   Normal LV size and function. EF 60-65%. No regional wall motion and amount is. Mild aortic sclerosis, no stenosis  . TUBAL LIGATION  1979    Current Meds  Medication Sig  . B Complex Vitamins (B COMPLEX 100 PO) Take 100 mg by mouth daily.   . bisacodyl (DULCOLAX) 5 MG EC tablet Take 5 mg by mouth daily as needed for moderate constipation. Take 1-2 at hs  . Calcium Carbonate (CALCIUM 600 PO) Take 600 mg by mouth 2 (two) times daily.   . carboxymethylcellulose (REFRESH PLUS) 0.5 % SOLN Place 1 drop into both eyes 3 (three) times daily as needed.   . cetirizine (ZYRTEC) 10 MG tablet Take 10 mg by mouth daily.  . Cholecalciferol (VITAMIN D) 2000 UNITS tablet Take  2,000 Units by mouth daily.    . citalopram (CELEXA) 40 MG tablet TAKE 1 TABLET BY MOUTH  DAILY  . Coenzyme Q-10 100 MG capsule Take 100 mg by mouth daily.  . diclofenac sodium (VOLTAREN) 1 % GEL Apply 4 g topically 4 (four) times daily as needed.  . docusate sodium (COLACE) 100 MG capsule Take 400 mg by mouth at bedtime.   . gabapentin (NEURONTIN) 300 MG capsule Take 1 capsule (300 mg total) by mouth 3 (three) times daily.  Marland Kitchen GRAPE SEED EXTRACT PO Take by mouth. 1 tablet daily  . levothyroxine (SYNTHROID, LEVOTHROID) 50 MCG tablet TAKE 1 TABLET BY MOUTH  DAILY  . MAGNESIUM GLYCINATE PLUS PO Take 400 mg by mouth at bedtime.  . mirtazapine (REMERON) 15 MG tablet TAKE 1 TABLET BY MOUTH AT  BEDTIME  . NON FORMULARY Daith piercing in right ear for headaches  . oxybutynin (DITROPAN-XL) 10 MG 24 hr  tablet TAKE ONE TABLET BY MOUTH EVERY NIGHT AT BEDTIME  . RABEprazole (ACIPHEX) 20 MG tablet TAKE 1 TABLET BY MOUTH TWICE A DAY  . TURMERIC PO Take 800 mg by mouth daily.   . [DISCONTINUED] methocarbamol (ROBAXIN) 500 MG tablet TAKE ONE (1) TABLET BY MOUTH EVERY EIGHTHOURS AS NEEDED FOR MUSCLE SPASMS  . [DISCONTINUED] morphine (MS CONTIN) 15 MG 12 hr tablet Take 1 tablet (15 mg total) by mouth 3 (three) times daily.    Allergies  Allergen Reactions  . Rizatriptan Benzoate Anaphylaxis    Throat and Tongue Swelling Closed  . Sumatriptan Other (See Comments)    Throat closed & Tongue Swelling  . Tizanidine Shortness Of Breath  . Aleve [Naproxen Sodium] Hives  . Clarithromycin Hives  . Cyclobenzaprine Hcl     REACTION: THROAT CLOSED,TOUNGE SWELLING  . Doxycycline Diarrhea and Nausea And Vomiting    Reaction: unknown  . Duloxetine Nausea Only  . Keflex [Cephalexin] Itching    Sores  . Metronidazole Diarrhea and Nausea Only  . Oxycodone Hcl Diarrhea    Vomiting  . Oxycontin [Oxycodone Hcl] Nausea And Vomiting  . Penicillins     TOLERATES CEPHALOSPORINS Reaction: unknown Has patient had a PCN  reaction causing immediate rash, facial/tongue/throat swelling, SOB or lightheadedness with hypotension: {unknown Has patient had a PCN reaction causing severe rash involving mucus membranes or skin necrosis: {unknown Has patient had a PCN reaction that required hospitalization {unknown Has patient had a PCN reaction occurring within the last 10 years: {no If all of the above answers are "NO", then may proceed with Cephalosporin use.  . Rosuvastatin     REACTION: MUSCLE ACHES  . Septra [Sulfamethoxazole-Trimethoprim] Other (See Comments)    Rash and itching  . Venlafaxine Hives  . Voltaren [Diclofenac Sodium] Hives    oral  . Tramadol Diarrhea, Nausea And Vomiting and Palpitations    Social History   Tobacco Use  . Smoking status: Never Smoker  . Smokeless tobacco: Never Used  Substance Use Topics  . Alcohol use: Yes    Alcohol/week: 0.0 standard drinks    Comment: rare  . Drug use: No   Social History   Social History Narrative  . Not on file    family history includes Allergies in her father and mother; Breast cancer in her mother and paternal aunt; Diabetes in her father and mother; Emphysema in her father; Heart disease in her father and mother; Stomach cancer in her maternal uncle.  Wt Readings from Last 3 Encounters:  06/13/18 228 lb (103.4 kg)  06/13/18 228 lb (103.4 kg)  05/21/18 220 lb (99.8 kg)    PHYSICAL EXAM BP 134/72   Pulse 67   Ht 5\' 6"  (1.676 m)   Wt 228 lb (103.4 kg)   BMI 36.80 kg/m  Physical Exam  Constitutional: She is oriented to person, place, and time. She appears well-developed and well-nourished. No distress.  Healthy-appearing.  Moderate-severely obese  HENT:  Head: Normocephalic and atraumatic.  Mouth/Throat: Oropharynx is clear and moist.  Eyes: Pupils are equal, round, and reactive to light. Conjunctivae and EOM are normal. No scleral icterus.  Neck: Normal range of motion. Neck supple. No hepatojugular reflux and no JVD present.  Carotid bruit is not present. No tracheal deviation present. No thyromegaly present.  Cardiovascular: Normal rate and regular rhythm.  Occasional extrasystoles are present. PMI is not displaced. Exam reveals no friction rub and no decreased pulses.  Murmur heard.  Low-pitched harsh crescendo-decrescendo early systolic murmur  is present with a grade of 1/6 at the upper right sternal border radiating to the neck. Pulmonary/Chest: Effort normal and breath sounds normal. No respiratory distress. She has no wheezes. She has no rales.  Abdominal: Soft. Bowel sounds are normal. She exhibits no distension. There is no abdominal tenderness. There is no rebound.  Musculoskeletal: Normal range of motion.        General: No edema (Only trivial).  Neurological: She is alert and oriented to person, place, and time. No cranial nerve deficit.  Skin: Skin is warm and dry. No rash noted. No erythema.  Psychiatric: She has a normal mood and affect. Her behavior is normal. Judgment and thought content normal.  Vitals reviewed.   Adult ECG Report  Rate: 67 ;  Rhythm: normal sinus rhythm and Normal axis, intervals and durations.;   Narrative Interpretation: Normal EKG   Other studies Reviewed: Additional studies/ records that were reviewed today include:  Recent Labs:  [facility] 12/2017: TC 176, TG 169, HDL 44, LDL 97; A1c 7. BUN/Cr 13/0.87. TSH 4.2   ASSESSMENT / PLAN: Problem List Items Addressed This Visit    Anginal chest pain at [facility]) - Primary    Difficult to tell if this is truly related to angina or not.  However she does have hypertension hyperlipidemia and had that one prolonged episode which is somewhat concerning.  Now that she is having intermittent episodes of chest pain I think we need to exclude ischemia. I would like to get a baseline assessment of her coronary disease as well as just simply learning if there is indeed ischemic CAD.  Plan: Coronary CTA with possible CT FFR. PRN nitroglycerin       Relevant Medications   nitroGLYCERIN (NITROSTAT) 0.4 MG SL tablet   metoprolol tartrate (LOPRESSOR) 50 MG tablet   Other Relevant Orders   EKG 12-Lead (Completed)   CT CORONARY MORPH W/CTA COR W/SCORE W/CA W/CM &/OR WO/CM   CT CORONARY FRACTIONAL FLOW RESERVE DATA PREP   CT CORONARY FRACTIONAL FLOW RESERVE FLUID ANALYSIS   Basic metabolic panel   Essential hypertension (Chronic)    Blood pressure looks pretty good.  Currently not on any medications. Depending on CT scan findings and ZIO Patch monitor, may want to start beta-blocker.      Relevant Medications   nitroGLYCERIN (NITROSTAT) 0.4 MG SL tablet   metoprolol tartrate (LOPRESSOR) 50 MG tablet   HYPERLIPIDEMIA (Chronic)    Pending order coronary CT angiogram shows, we may target LDL of less than 70 versus less than 100.  Currently, her LDL is below 100 on no medications.      Relevant Medications   nitroGLYCERIN (NITROSTAT) 0.4 MG SL tablet   metoprolol tartrate (LOPRESSOR) 50 MG tablet   Rapid heartbeat    Hard to tell if she is having a true arrhythmia sensation triggers PACs or PVCs.  Based on the relative frequency of the symptoms, I think a 2-week monitor would provide adequate time to evaluate. Plan: ZIO Patch monitor (2-week)      Relevant Orders   EKG 12-Lead (Completed)   LONG TERM MONITOR (3-14 DAYS)   Basic metabolic panel    Other Visit Diagnoses    Pre-op testing       Relevant Medications   metoprolol tartrate (LOPRESSOR) 50 MG tablet   Other Relevant Orders   Basic metabolic panel      I spent a total of 35 minutes with the patient and chart review. >  50% of  the time was spent in direct patient consultation.   Current medicines are reviewed at length with the patient today.  (+/- concerns) n/a The following changes have been made:  see below  Patient Instructions  Medication Instructions:  -- START ASPIRIN 81 MG ONE TABLET DAILY  -- MAY USE NITROGLYCERIN 0.4 MG SUBLINGUAL IF NEEDED - FOR  CHEST PAIN  If you need a refill on your cardiac medications before your next appointment, please call your pharmacy.   Lab work: SEE  INSTRUCTION FOR  CCTA   Testing/Procedures: SCHEDULE AT [place] Your physician has requested that you have cardiac CT. Cardiac computed tomography (CT) is a painless test that uses an x-ray machine to take clear, detailed pictures of your heart. For further information please visit [email or web address][email or web address]. Please follow instruction sheet as given. AND SCHEDULE AT [place] Your physician has recommended that you wear a holter monitor-14 DAY ZIO PATCH. Holter monitors are medical devices that record the heart's electrical activity. Doctors most often use these monitors to diagnose arrhythmias. Arrhythmias are problems with the speed or rhythm of the heartbeat. The monitor is a small, portable device. You can wear one while you do your normal daily activities. This is usually used to diagnose what is causing palpitations/syncope (passing out).   Follow-Up: At [facility], you and your health needs are our priority.  As part of our continuing mission to provide you with exceptional heart care, we have created designated Provider Care Teams.  These Care Teams include your primary Cardiologist (physician) and Advanced Practice Providers (APPs -  Physician Assistants and Nurse Practitioners) who all work together to provide you with the care you need, when you need it. . Your physician recommends that you schedule a follow-up appointment in 2 [place]  .   Studies Ordered:   Orders Placed This Encounter  Procedures  . CT CORONARY MORPH W/CTA COR W/SCORE W/CA W/CM &/OR WO/CM  . CT CORONARY FRACTIONAL FLOW RESERVE DATA PREP  . CT CORONARY FRACTIONAL FLOW RESERVE FLUID ANALYSIS  . Basic metabolic panel  . LONG TERM MONITOR (3-14 DAYS)  . EKG 12-Lead      Glenetta Hew, M.D.,  M.S. Interventional Cardiologist   Pager # [phone] Phone # [phone] [address] [place], [place] [address]   Thank you for choosing Heartcare at [facility]!!

## 2018-06-13 NOTE — Patient Instructions (Addendum)
Medication Instructions:  -- START ASPIRIN 81 MG ONE TABLET DAILY  -- MAY USE NITROGLYCERIN 0.4 MG SUBLINGUAL IF NEEDED - FOR CHEST PAIN  If you need a refill on your cardiac medications before your next appointment, please call your pharmacy.   Lab work: SEE  INSTRUCTION FOR  CCTA If you have labs (blood work) drawn today and your tests are completely normal, you will receive your results only by: Marland Kitchen MyChart Message (if you have MyChart) OR . A paper copy in the mail If you have any lab test that is abnormal or we need to change your treatment, we will call you to review the results.  Testing/Procedures: SCHEDULE AT [place] Your physician has requested that you have cardiac CT. Cardiac computed tomography (CT) is a painless test that uses an x-ray machine to take clear, detailed pictures of your heart. For further information please visit [email or web address]. Please follow instruction sheet as given. AND SCHEDULE AT [place] Your physician has recommended that you wear a holter monitor-14 DAY ZIO PATCH. Holter monitors are medical devices that record the heart's electrical activity. Doctors most often use these monitors to diagnose arrhythmias. Arrhythmias are problems with the speed or rhythm of the heartbeat. The monitor is a small, portable device. You can wear one while you do your normal daily activities. This is usually used to diagnose what is causing palpitations/syncope (passing out).      Follow-Up: At [facility], you and your health needs are our priority.  As part of our continuing mission to provide you with exceptional heart care, we have created designated Provider Care Teams.  These Care Teams include your primary Cardiologist (physician) and Advanced Practice Providers (APPs -  Physician Assistants and Nurse Practitioners) who all work together to provide you with the care you need, when you need it. . Your physician  recommends that you schedule a follow-up appointment in 2 [place]  .   Any Other Special Instructions Will Be Listed Below (If Applicable).    IF YOU HAVE PROLONG CHEST PAIN AND USING NITROGLYCERIN AND TAKING 4  BABY ASPIRIN ( CHEWED) DOES NT URELIEF PAIN CALL 911 TO GO TO [place]  .Nitroglycerin sublingual tablets What is this medicine? NITROGLYCERIN (nye troe GLI ser in) is a type of vasodilator. It relaxes blood vessels, increasing the blood and oxygen supply to your heart. This medicine is used to relieve chest pain caused by angina. It is also used to prevent chest pain before activities like climbing stairs, going outdoors in cold weather, or sexual activity. This medicine may be used for other purposes; ask your health care provider or pharmacist if you have questions. COMMON BRAND NAME(S): Nitroquick, Nitrostat, Nitrotab What should I tell my health care provider before I take this medicine? They need to know if you have any of these conditions: -anemia -head injury, recent stroke, or bleeding in the brain -liver disease -previous heart attack -an unusual or allergic reaction to nitroglycerin, other medicines, foods, dyes, or preservatives -pregnant or trying to get pregnant -breast-feeding How should I use this medicine? Take this medicine by mouth as needed. At the first sign of an angina attack (chest pain or tightness) place one tablet under your tongue. You can also take this medicine 5 to 10 minutes before an event likely to produce chest pain. Follow the directions on the prescription label. Let the tablet dissolve under the tongue. Do not swallow whole. Replace the  dose if you accidentally swallow it. It will help if your mouth is not dry. Saliva around the tablet will help it to dissolve more quickly. Do not eat or drink, smoke or chew tobacco while a tablet is dissolving. If you are not better within 5 minutes after taking ONE dose of nitroglycerin, call  9-1-1 immediately to seek emergency medical care. Do not take more than 3 nitroglycerin tablets over 15 minutes. If you take this medicine often to relieve symptoms of angina, your doctor or health care professional may provide you with different instructions to manage your symptoms. If symptoms do not go away after following these instructions, it is important to call 9-1-1 immediately. Do not take more than 3 nitroglycerin tablets over 15 minutes. Talk to your pediatrician regarding the use of this medicine in children. Special care may be needed. Overdosage: If you think you have taken too much of this medicine contact a poison control center or emergency room at once. NOTE: This medicine is only for you. Do not share this medicine with others. What if I miss a dose? This does not apply. This medicine is only used as needed. What may interact with this medicine? Do not take this medicine with any of the following medications: -certain migraine medicines like ergotamine and dihydroergotamine (DHE) -medicines used to treat erectile dysfunction like sildenafil, tadalafil, and vardenafil -riociguat This medicine may also interact with the following medications: -alteplase -aspirin -heparin -medicines for high blood pressure -medicines for mental depression -other medicines used to treat angina -phenothiazines like chlorpromazine, mesoridazine, prochlorperazine, thioridazine This list may not describe all possible interactions. Give your health care provider a list of all the medicines, herbs, non-prescription drugs, or dietary supplements you use. Also tell them if you smoke, drink alcohol, or use illegal drugs. Some items may interact with your medicine. What should I watch for while using this medicine? Tell your doctor or health care professional if you feel your medicine is no longer working. Keep this medicine with you at all times. Sit or lie down when you take your medicine to prevent  falling if you feel dizzy or faint after using it. Try to remain calm. This will help you to feel better faster. If you feel dizzy, take several deep breaths and lie down with your feet propped up, or bend forward with your head resting between your knees. You may get drowsy or dizzy. Do not drive, use machinery, or do anything that needs mental alertness until you know how this drug affects you. Do not stand or sit up quickly, especially if you are an older patient. This reduces the risk of dizzy or fainting spells. Alcohol can make you more drowsy and dizzy. Avoid alcoholic drinks. Do not treat yourself for coughs, colds, or pain while you are taking this medicine without asking your doctor or health care professional for advice. Some ingredients may increase your blood pressure. What side effects may I notice from receiving this medicine? Side effects that you should report to your doctor or health care professional as soon as possible: -blurred vision -dry mouth -skin rash -sweating -the feeling of extreme pressure in the head -unusually weak or tired Side effects that usually do not require medical attention (report to your doctor or health care professional if they continue or are bothersome): -flushing of the face or neck -headache -irregular heartbeat, palpitations -nausea, vomiting This list may not describe all possible side effects. Call your doctor for medical advice about side effects. You  may report side effects to FDA at 1-800-FDA-1088. Where should I keep my medicine? Keep out of the reach of children. Store at room temperature between 20 and 25 degrees C (68 and 77 degrees F). Store in [facility] of Staff. Protect from light and moisture. Keep tightly closed. Throw away any unused medicine after the expiration date. NOTE: This sheet is a summary. It may not cover all possible information. If you have questions about this medicine, talk to your doctor, pharmacist, or health care  provider.  2019 Elsevier/Gold Standard (2013-04-11 17:57:36)     Please arrive at the [facility] main entrance of [facility] a (30-45 minutes prior to test start time)  [facility] [place], [place] [address] [phone]  Proceed to the [facility] Radiology Department (First Floor).  Please follow these instructions carefully (unless otherwise directed):  PLEASE HAVE LAB WORK DONE WEEK PRIOR TO TEST DAY AND PICK UP MEDICATION FROM [facility]  On the Night Before the Test: . Be sure to Drink plenty of water. . Do not consume any caffeinated/decaffeinated beverages or chocolate 12 hours prior to your test. . Do not take any antihistamines 12 hours prior to your test.  On the Day of the Test: . Drink plenty of water. Do not drink any water within one hour of the test. . Do not eat any food 4 hours prior to the test. . You may take your regular medications prior to the test.  . Take metoprolol (Lopressor) 100 MG  two hours prior to test.        After the Test: . Drink plenty of water. . After receiving IV contrast, you may experience a mild flushed feeling. This is normal. . On occasion, you may experience a mild rash up to 24 hours after the test. This is not dangerous. If this occurs, you can take Benadryl 25 mg and increase your fluid intake. . If you experience trouble breathing, this can be serious. If it is severe call 911 IMMEDIATELY. If it is mild, please call our office.

## 2018-06-13 NOTE — Progress Notes (Signed)
Subjective:    Patient ID: [name], female    DOB: Feb 19, 1951, 67 y.o.   MRN: [id number]  XBL:[id number] [name] is a 67 y.o. female who returns for follow up appointment for chronic pain and medication refill. She states her pain is located in her mid- lower back, right hip and bilateral knee pain. She rates her pain 5. Her current exercise regime is walking.   [name] Morphine equivalent is 45.00 MME.  Last Oral Swab was Performed on 03/21/2018, it was negative  Pain Inventory Average Pain 5 Pain Right Now 5 My pain is constant, sharp, burning and aching  In the last 24 hours, has pain interfered with the following? General activity 5 Relation with others 5 Enjoyment of life 5 What TIME of day is your pain at its worst? daytime, evening, night Sleep (in general) Fair  Pain is worse with: bending, standing and some activites Pain improves with: rest, heat/ice, medication and injections Relief from Meds: 4  Mobility walk without assistance how many minutes can you walk? 10-15 ability to climb steps?  yes do you drive?  yes Do you have any goals in this area?  yes  Function disabled: date disabled . retired I need assistance with the following:  meal prep, household duties and shopping Do you have any goals in this area?  yes  Neuro/Psych   Prior Studies   Physicians involved in your care    Family History  Problem Relation Age of Onset  . Breast cancer Mother   . Diabetes Mother   . Heart disease Mother   . Allergies Mother   . Diabetes Father   . Heart disease Father   . Emphysema Father   . Allergies Father   . Stomach cancer Maternal Uncle   . Breast cancer Paternal Aunt   . Colon cancer Neg Hx    Social History   Socioeconomic History  . Marital status: Married    Spouse name: Not on file  . Number of children: Not on file  . Years of education: Not on file  . Highest education level: Not on file  Occupational History  . Occupation:  Medical laboratory scientific officer  . Financial resource strain: Not on file  . Food insecurity:    Worry: Not on file    Inability: Not on file  . Transportation needs:    Medical: Not on file    Non-medical: Not on file  Tobacco Use  . Smoking status: Never Smoker  . Smokeless tobacco: Never Used  Substance and Sexual Activity  . Alcohol use: Yes    Alcohol/week: 0.0 standard drinks    Comment: rare  . Drug use: No  . Sexual activity: Not on file  Lifestyle  . Physical activity:    Days per week: Not on file    Minutes per session: Not on file  . Stress: Not on file  Relationships  . Social connections:    Talks on phone: Not on file    Gets together: Not on file    Attends religious service: Not on file    Active member of club or organization: Not on file    Attends meetings of clubs or organizations: Not on file    Relationship status: Not on file  Other Topics Concern  . Not on file  Social History Narrative  . Not on file   Past Surgical History:  Procedure Laterality Date  . [place] STUDY N/A 01/04/2016  Procedure: [place] STUDY;  Surgeon: Manus Gunning, MD;  Location: [facility];  Service: Gastroenterology;  Laterality: N/A;  . ABDOMINAL HYSTERECTOMY  1988  . ADENOIDECTOMY    . ANTERIOR CERVICAL DECOMP/DISCECTOMY FUSION  2009, 1993   first procedure in ~ 1993/Dr Botero  . APPENDECTOMY  1988  . BACK SURGERY    . CHOLECYSTECTOMY  2005  . ESOPHAGEAL MANOMETRY N/A 01/04/2016   Procedure: ESOPHAGEAL MANOMETRY (EM) 24 HR PH;  Surgeon: Manus Gunning, MD;  Location: [facility];  Service: Gastroenterology;  Laterality: N/A;  . ESOPHAGOGASTRODUODENOSCOPY (EGD) WITH PROPOFOL N/A 05/17/2016   Procedure: ESOPHAGOGASTRODUODENOSCOPY (EGD) WITH PROPOFOL;  Surgeon: Manus Gunning, MD;  Location: [facility];  Service: Gastroenterology;  Laterality: N/A;  . NASAL SINUS SURGERY  2006  . SAVORY DILATION N/A 05/17/2016   Procedure: SAVORY DILATION;   Surgeon: Manus Gunning, MD;  Location: [facility];  Service: Gastroenterology;  Laterality: N/A;  . TONSILLECTOMY AND ADENOIDECTOMY    . TRANSTHORACIC ECHOCARDIOGRAM  2009   Normal LV size and function. EF 60-65%. No regional wall motion and amount is. Mild aortic sclerosis, no stenosis  . TUBAL LIGATION  1979   Past Medical History:  Diagnosis Date  . Allergy   . Anemia   . Arthritis   . B12 deficiency   . Complication of anesthesia    slow to wake up   . Depression   . Diabetes mellitus without complication ([place])    type II - diet controlled ; A1C 6.5 01/2017  . Fibromyalgia   . Generalized hyperhidrosis 04/17/2014  . GERD (gastroesophageal reflux disease)   . Heart murmur    Aortic Sclerosis  . Hyperlipemia   . HYPOTHYROIDISM 05/29/2007  . Irritable bowel syndrome   . Migraines    hx of   . Oxygen deficiency    2 L at bedtime  . 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 PONV (postoperative nausea and vomiting)   . PVC (premature ventricular contraction)   . Type II or unspecified type diabetes mellitus with neurological manifestations, not stated as uncontrolled(250.60) 04/07/2013  . Unspecified essential hypertension    denies htn on 05/09/16   BP 130/78   Pulse 70   Resp 14   Ht 5\' 6"  (1.676 m)   Wt 228 lb (103.4 kg)   SpO2 95%   BMI 36.80 kg/m   Opioid Risk Score:   Fall Risk Score:  `1  Depression screen PHQ 2/9  Depression screen [facility] 2/9 04/24/2018 01/10/2018 11/24/2017 03/27/2017 02/28/2017 01/25/2017 01/02/2017  Decreased Interest 0 2 1 0 1 1 0  Down, Depressed, Hopeless 0 0 1 0 1 1 0  PHQ - 2 Score 0 2 2 0 2 2 0  Altered sleeping - 0 - - - - -  Tired, decreased energy - 1 - - - - -  Change in appetite - 0 - - - - -  Feeling bad or failure about yourself  - 0 - - - - -  Trouble concentrating - 1 - - - - -  Moving slowly or fidgety/restless - 0 - - - - -  Suicidal thoughts - 0 - - - - -  PHQ-9 Score - 4 - - - - -    Difficult doing work/chores - Not difficult at all Not difficult at all - - - -  Some recent data might be hidden    Review of  Systems  Constitutional: Negative.   HENT: Negative.   Eyes: Negative.   Respiratory: Negative.   Cardiovascular: Positive for leg swelling.  Gastrointestinal: Negative.   Endocrine: Negative.   Genitourinary: Negative.   Musculoskeletal: Positive for arthralgias and back pain.  Skin: Negative.   Allergic/Immunologic: Negative.   Neurological: Positive for tremors, weakness and numbness.       Tingling  Hematological: Negative.   Psychiatric/Behavioral: Positive for dysphoric mood.  All other systems reviewed and are negative.      Objective:   Physical Exam Vitals signs and nursing note reviewed.  Constitutional:      Appearance: Normal appearance.  Neck:     Musculoskeletal: Normal range of motion and neck supple.  Cardiovascular:     Pulses: Normal pulses.     Heart sounds: Normal heart sounds.  Pulmonary:     Effort: Pulmonary effort is normal.     Breath sounds: Normal breath sounds.  Musculoskeletal:     Comments: Normal Muscle Bulk and Muscle Testing Reveals:  Upper Extremities: Full ROM and Muscle Strength 5/5 Thoracic Paraspinal Tenderness: T-7-T-9 Lumbar Paraspinal Tenderness: L-3-L-5 Lower Extremities: Full ROM and Muscle Strength 5/5 Arises from Table with ease Narrow Based  Gait   Skin:    General: Skin is warm and dry.  Neurological:     Mental Status: She is alert and oriented to person, place, and time.  Psychiatric:        Mood and Affect: Mood normal.        Behavior: Behavior normal.           Assessment & Plan:  1. Cervical postlaminectomy syndrome: with neck and shoulder pain. 06/13/2018. Continue exercise routine and using heat therapy. Continue current medication regimen withMS Contin 15 mg one tablet three times a day #90.  We will continue the opioid monitoring program, this consists of regular clinic  visits, examinations, urine drug screen, pill counts as well as use of [place] Controlled Substance reporting System. 2. Fibromyalgia/ Neuropathy: Continuecurrent medication regimen withGabapentin, activity and exercise regime. 06/13/2018. 3. Bilateral Knee Pain/ Mild Degenerative Changes:.Continuecurrent medication regimen withVoltaren Gel.06/13/2018 4. Right Ankle Pain: No Complaints Today. Continue to monitor..06/13/2018 5. Muscle Spasm: Continuecurrent medication regimen withRobaxin. 06/13/2018 6. Opioid Induced Constipation: No complaints Continue to Monitor. 1218/2019. 7. Midline Low Back Pain: Continue current medication regime, continue HEP as tolerated.06/13/2018 8. Right Gluteal Medius Weakness and Atrophy: No complaints Today:Continue with Hip Abduction Exercises as tolerated: Continue to Monitor. 06/13/2018. 9. Right Greater Trochanter Bursitis: Continue to Alternate Ice and Heat Therapy. Continue to Monitor. 06/13/2018.  10. Sacroiliac Joint Pain: S/P Right Sacroiliac Injection with relief noted.  20 minutes of face to face patient care time was spent during this visit. All questions were encouraged and answered.   F/U in 1 month

## 2018-06-14 ENCOUNTER — Encounter: Payer: Medicare Other | Admitting: Registered Nurse

## 2018-06-14 ENCOUNTER — Ambulatory Visit (INDEPENDENT_AMBULATORY_CARE_PROVIDER_SITE_OTHER): Payer: Medicare Other

## 2018-06-14 DIAGNOSIS — R000 Tachycardia, unspecified: Secondary | ICD-10-CM

## 2018-06-15 ENCOUNTER — Encounter: Payer: Self-pay | Admitting: Cardiology

## 2018-06-15 NOTE — Assessment & Plan Note (Addendum)
Difficult to tell if this is truly related to angina or not.  However she does have hypertension hyperlipidemia and had that one prolonged episode which is somewhat concerning.  Now that she is having intermittent episodes of chest pain I think we need to exclude ischemia. I would like to get a baseline assessment of her coronary disease as well as just simply learning if there is indeed ischemic CAD.  Plan: Coronary CTA with possible CT FFR. PRN nitroglycerin

## 2018-06-15 NOTE — Assessment & Plan Note (Addendum)
Blood pressure looks pretty good.  Currently not on any medications. Depending on CT scan findings and ZIO Patch monitor, may want to start beta-blocker.

## 2018-06-15 NOTE — Assessment & Plan Note (Signed)
Hard to tell if she is having a true arrhythmia sensation triggers PACs or PVCs.  Based on the relative frequency of the symptoms, I think a 2-week monitor would provide adequate time to evaluate. Plan: ZIO Patch monitor (2-week)

## 2018-06-15 NOTE — Assessment & Plan Note (Signed)
Pending order coronary CT angiogram shows, we may target LDL of less than 70 versus less than 100.  Currently, her LDL is below 100 on no medications.

## 2018-06-23 DIAGNOSIS — J9610 Chronic respiratory failure, unspecified whether with hypoxia or hypercapnia: Secondary | ICD-10-CM | POA: Diagnosis not present

## 2018-07-03 ENCOUNTER — Ambulatory Visit (INDEPENDENT_AMBULATORY_CARE_PROVIDER_SITE_OTHER): Payer: Medicare Other | Admitting: Podiatry

## 2018-07-03 ENCOUNTER — Encounter: Payer: Self-pay | Admitting: Podiatry

## 2018-07-03 DIAGNOSIS — L97522 Non-pressure chronic ulcer of other part of left foot with fat layer exposed: Secondary | ICD-10-CM

## 2018-07-03 DIAGNOSIS — I70245 Atherosclerosis of native arteries of left leg with ulceration of other part of foot: Secondary | ICD-10-CM

## 2018-07-03 DIAGNOSIS — E0843 Diabetes mellitus due to underlying condition with diabetic autonomic (poly)neuropathy: Secondary | ICD-10-CM

## 2018-07-05 NOTE — Progress Notes (Signed)
   Subjective:  Patient presents today for follow up evaluation of a postsurgical wound dehiscence status post bunionectomy to the left foot. DOS: 02/16/2018. She states she is doing better. She reports some intermittent aching. She denies modifying factors. She has been using Aquacel Ag as directed. Patient is here for further evaluation and treatment.   Past Medical History:  Diagnosis Date  . Allergy   . Anemia   . Arthritis   . B12 deficiency   . Complication of anesthesia    slow to wake up   . Depression   . Diabetes mellitus without complication ([place])    type II - diet controlled ; A1C 6.5 01/2017  . Fibromyalgia   . Generalized hyperhidrosis 04/17/2014  . GERD (gastroesophageal reflux disease)   . Heart murmur    Aortic Sclerosis  . Hyperlipemia   . HYPOTHYROIDISM 05/29/2007  . Irritable bowel syndrome   . Migraines    hx of   . Oxygen deficiency    2 L at bedtime  . 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 PONV (postoperative nausea and vomiting)   . PVC (premature ventricular contraction)   . Type II or unspecified type diabetes mellitus with neurological manifestations, not stated as uncontrolled(250.60) 04/07/2013  . Unspecified essential hypertension    denies htn on 05/09/16     Objective/Physical Exam Neurovascular status intact. Pedal pulses are palpable. Neurological sensation intact. Significant amount of dehiscence of the [place] bunionectomy site measuring approximately 2.5 x 1.5 x 0.3 cm (LxWxD).  There is some improvement in the size of the wound today. To the surgical wound dehiscence there is some stable eschar noted around the area likely consistent with a small vessel disease. No malodor noted. There continues to be no exposed bone, joint, or hardware noted.  There continues to be exposed EHL tendon. Negative for any significant erythema or edema at the moment. Incision site overlying the second MTPJ left foot appears to have a dry  stable eschar overlying the incision site. No drainage noted. No malodor noted.  Assessment: 1. s/p [place] bunionectomy and metatarsal osteotomy second digit left foot.. DOS: 02/16/2018 2. Surgical wound dehiscence possibly secondary to small vessel disease.  Plan of Care:  1. Patient was evaluated.  2. Medically necessary excisional debridement including muscle and deep fascial tissue was performed using a tissue nipper and a chisel blade. Excisional debridement of all the necrotic nonviable tissue down to healthy bleeding viable tissue was performed with post-debridement measurements same as pre-. 3. The wound was cleansed and dry sterile dressing applied. 4. Continue using Aqaucel Ag daily with a dry sterile dressing.  5. Compression anklet dispensed.  6. Return to clinic in 4 weeks.    Edrick Kins, DPM [facility]  Dr. Edrick Kins, [place]                                        [place], [place] [address]                Office [phone]  Fax [phone]

## 2018-07-06 ENCOUNTER — Ambulatory Visit
Admission: RE | Admit: 2018-07-06 | Discharge: 2018-07-06 | Disposition: A | Discharge status: Home / Self Care | Payer: Medicare Other | Source: Ambulatory Visit | Attending: Family Medicine | Admitting: Family Medicine

## 2018-07-06 DIAGNOSIS — Z1231 Encounter for screening mammogram for malignant neoplasm of breast: Secondary | ICD-10-CM

## 2018-07-06 DIAGNOSIS — R000 Tachycardia, unspecified: Secondary | ICD-10-CM | POA: Diagnosis not present

## 2018-07-09 DIAGNOSIS — R000 Tachycardia, unspecified: Secondary | ICD-10-CM | POA: Diagnosis not present

## 2018-07-09 DIAGNOSIS — Z01818 Encounter for other preprocedural examination: Secondary | ICD-10-CM | POA: Diagnosis not present

## 2018-07-09 DIAGNOSIS — I208 Other forms of angina pectoris: Secondary | ICD-10-CM | POA: Diagnosis not present

## 2018-07-10 ENCOUNTER — Telehealth (HOSPITAL_COMMUNITY): Payer: Self-pay | Admitting: Emergency Medicine

## 2018-07-10 LAB — BASIC METABOLIC PANEL
BUN/Creatinine Ratio: 20 (ref 12–28)
BUN: 13 mg/dL (ref 8–27)
CO2: 28 mmol/L (ref 20–29)
Calcium: 9.2 mg/dL (ref 8.7–10.3)
Chloride: 98 mmol/L (ref 96–106)
Creatinine, Ser: 0.64 mg/dL (ref 0.57–1.00)
GFR calc Af Amer: 108 mL/min/{1.73_m2} (ref >59)
GFR calc non Af Amer: 93 mL/min/{1.73_m2} (ref >59)
Glucose: 184 mg/dL — ABNORMAL HIGH (ref 65–99)
Potassium: 4.6 mmol/L (ref 3.5–5.2)
Sodium: 140 mmol/L (ref 134–144)

## 2018-07-10 NOTE — Telephone Encounter (Signed)
Left message on voicemail with name and callback number Alanzo Lamb RN Navigator Cardiac Imaging [phone] 

## 2018-07-10 NOTE — Telephone Encounter (Signed)
Message sent via my chart

## 2018-07-11 ENCOUNTER — Encounter (HOSPITAL_COMMUNITY): Payer: Self-pay

## 2018-07-11 ENCOUNTER — Ambulatory Visit (HOSPITAL_COMMUNITY)
Admission: RE | Admit: 2018-07-11 | Discharge: 2018-07-11 | Disposition: A | Discharge status: Home / Self Care | Payer: Medicare Other | Source: Ambulatory Visit | Attending: Cardiology | Admitting: Cardiology

## 2018-07-11 ENCOUNTER — Ambulatory Visit (HOSPITAL_COMMUNITY): Admission: RE | Admit: 2018-07-11 | Payer: Medicare Other | Source: Ambulatory Visit

## 2018-07-11 DIAGNOSIS — I208 Other forms of angina pectoris: Secondary | ICD-10-CM | POA: Insufficient documentation

## 2018-07-11 DIAGNOSIS — Z006 Encounter for examination for normal comparison and control in clinical research program: Secondary | ICD-10-CM

## 2018-07-11 MED ORDER — SODIUM CHLORIDE 0.9% FLUSH: 10.0000 mL | INTRAVENOUS | Status: DC | PRN

## 2018-07-11 MED ORDER — IOPAMIDOL (ISOVUE-370) INJECTION 76%
80.0000 mL | Freq: Once | INTRAVENOUS | Status: AC | PRN
  Administered 2018-07-11: 80 mL via INTRAVENOUS

## 2018-07-11 MED ORDER — NITROGLYCERIN 0.4 MG SL SUBL
0.8000 mg | SUBLINGUAL_TABLET | Freq: Once | SUBLINGUAL | Status: AC
  Administered 2018-07-11: 0.8 mg via SUBLINGUAL
  Filled 2018-07-11: qty 25

## 2018-07-11 MED ORDER — SODIUM CHLORIDE 0.9% FLUSH: 10.0000 mL | Freq: Two times a day (BID) | INTRAVENOUS | Status: DC

## 2018-07-11 MED ORDER — NITROGLYCERIN 0.4 MG SL SUBL
SUBLINGUAL_TABLET | SUBLINGUAL | Status: AC
  Filled 2018-07-11: qty 2

## 2018-07-11 NOTE — Research (Deleted)
Cadfem Informed Consent    Patient Name:    Subject met inclusion and exclusion criteria.  The informed consent form, study requirements and expectations were reviewed with the subject and questions and concerns were addressed prior to the signing of the consent form.  The subject verbalized understanding of the trail requirements.  The subject agreed to participate in the CADFEM trial and signed the informed consent.  The informed consent was obtained prior to performance of any protocol-specific procedures for the subject.  A copy of the signed informed consent was given to the subject and a copy was placed in the subject's medical record.   Ronelle Smallman S Vivan Agostino  Research Assistant 

## 2018-07-11 NOTE — Progress Notes (Signed)
Patient tolerated CT without incident. Drank coffee and ate peanut butter crackers. Ambulated to exit where husband was waiting steady gait.

## 2018-07-15 ENCOUNTER — Other Ambulatory Visit: Payer: Self-pay | Admitting: Family Medicine

## 2018-07-18 ENCOUNTER — Encounter: Payer: Medicare Other | Admitting: Registered Nurse

## 2018-07-19 ENCOUNTER — Encounter: Payer: Self-pay | Admitting: Registered Nurse

## 2018-07-19 ENCOUNTER — Encounter: Payer: Medicare Other | Attending: Physical Medicine & Rehabilitation | Admitting: Registered Nurse

## 2018-07-19 VITALS — BP 127/77 | HR 73 | Ht 66.0 in | Wt 226.6 lb

## 2018-07-19 DIAGNOSIS — M797 Fibromyalgia: Secondary | ICD-10-CM

## 2018-07-19 DIAGNOSIS — G894 Chronic pain syndrome: Secondary | ICD-10-CM | POA: Diagnosis not present

## 2018-07-19 DIAGNOSIS — G8929 Other chronic pain: Secondary | ICD-10-CM

## 2018-07-19 DIAGNOSIS — Z79899 Other long term (current) drug therapy: Secondary | ICD-10-CM

## 2018-07-19 DIAGNOSIS — Z5181 Encounter for therapeutic drug level monitoring: Secondary | ICD-10-CM

## 2018-07-19 DIAGNOSIS — M546 Pain in thoracic spine: Secondary | ICD-10-CM | POA: Diagnosis not present

## 2018-07-19 DIAGNOSIS — M961 Postlaminectomy syndrome, not elsewhere classified: Secondary | ICD-10-CM | POA: Diagnosis not present

## 2018-07-19 MED ORDER — MORPHINE SULFATE ER 15 MG PO TBCR: 15.0000 mg | EXTENDED_RELEASE_TABLET | Freq: Three times a day (TID) | ORAL | 0 refills | Status: DC

## 2018-07-19 MED ORDER — GABAPENTIN 300 MG PO CAPS: 300.0000 mg | ORAL_CAPSULE | Freq: Three times a day (TID) | ORAL | 2 refills | Status: DC

## 2018-07-19 NOTE — Progress Notes (Signed)
Subjective:    Patient ID: [name], female    DOB: [birth year], 68 y.o.   MRN: [id number]  HPI: [name] is a 68 y.o. female who returns for follow up appointment for chronic pain and medication refill. She states her pain is located in her mid-back. She rates her pain 4. Her current exercise regime is walking and performing stretching exercises.  [name] Morphine equivalent is 45.00 MME.  Last Oral Swab was Performed on 03/21/2018, see note for details.   Pain Inventory Average Pain 5 Pain Right Now 4 My pain is constant, burning and aching  In the last 24 hours, has pain interfered with the following? General activity 6 Relation with others 6 Enjoyment of life 5 What TIME of day is your pain at its worst? evening night Sleep (in general) Fair  Pain is worse with: walking, sitting, standing and some activites Pain improves with: rest, heat/ice, pacing activities, medication and injections Relief from Meds: 5  Mobility walk without assistance how many minutes can you walk? 15 ability to climb steps?  yes do you drive?  yes  Function retired I need assistance with the following:  meal prep, household duties and shopping  Neuro/Psych bladder control problems numbness tremor tingling spasms depression  Prior Studies Any changes since last visit?  no  Physicians involved in your care Any changes since last visit?  no   Family History  Problem Relation Age of Onset  . Breast cancer Mother   . Diabetes Mother   . Heart disease Mother   . Allergies Mother   . Diabetes Father   . Heart disease Father   . Emphysema Father   . Allergies Father   . Stomach cancer Maternal Uncle   . Breast cancer Paternal Aunt   . Colon cancer Neg Hx    Social History   Socioeconomic History  . Marital status: Married    Spouse name: Not on file  . Number of children: Not on file  . Years of education: Not on file  . Highest education level: Not on file    Occupational History  . Occupation: Medical laboratory scientific officer  . Financial resource strain: Not on file  . Food insecurity:    Worry: Not on file    Inability: Not on file  . Transportation needs:    Medical: Not on file    Non-medical: Not on file  Tobacco Use  . Smoking status: Never Smoker  . Smokeless tobacco: Never Used  Substance and Sexual Activity  . Alcohol use: Yes    Alcohol/week: 0.0 standard drinks    Comment: rare  . Drug use: No  . Sexual activity: Not on file  Lifestyle  . Physical activity:    Days per week: Not on file    Minutes per session: Not on file  . Stress: Not on file  Relationships  . Social connections:    Talks on phone: Not on file    Gets together: Not on file    Attends religious service: Not on file    Active member of club or organization: Not on file    Attends meetings of clubs or organizations: Not on file    Relationship status: Not on file  Other Topics Concern  . Not on file  Social History Narrative  . Not on file   Past Surgical History:  Procedure Laterality Date  . [place] STUDY N/A 01/04/2016   Procedure: 24 HOUR  [place] STUDY;  Surgeon: Manus Gunning, MD;  Location: [facility];  Service: Gastroenterology;  Laterality: N/A;  . ABDOMINAL HYSTERECTOMY  1988  . ADENOIDECTOMY    . ANTERIOR CERVICAL DECOMP/DISCECTOMY FUSION  2009, 1993   first procedure in ~ 1993/Dr Botero  . APPENDECTOMY  1988  . BACK SURGERY    . CHOLECYSTECTOMY  2005  . ESOPHAGEAL MANOMETRY N/A 01/04/2016   Procedure: ESOPHAGEAL MANOMETRY (EM) 24 HR PH;  Surgeon: Manus Gunning, MD;  Location: [facility];  Service: Gastroenterology;  Laterality: N/A;  . ESOPHAGOGASTRODUODENOSCOPY (EGD) WITH PROPOFOL N/A 05/17/2016   Procedure: ESOPHAGOGASTRODUODENOSCOPY (EGD) WITH PROPOFOL;  Surgeon: Manus Gunning, MD;  Location: [facility];  Service: Gastroenterology;  Laterality: N/A;  . NASAL SINUS SURGERY  2006  . SAVORY DILATION N/A  05/17/2016   Procedure: SAVORY DILATION;  Surgeon: Manus Gunning, MD;  Location: [facility];  Service: Gastroenterology;  Laterality: N/A;  . TONSILLECTOMY AND ADENOIDECTOMY    . TRANSTHORACIC ECHOCARDIOGRAM  2009   Normal LV size and function. EF 60-65%. No regional wall motion and amount is. Mild aortic sclerosis, no stenosis  . TUBAL LIGATION  1979   Past Medical History:  Diagnosis Date  . Allergy   . Anemia   . Arthritis   . B12 deficiency   . Complication of anesthesia    slow to wake up   . Depression   . Diabetes mellitus without complication ([place])    type II - diet controlled ; A1C 6.5 01/2017  . Fibromyalgia   . Generalized hyperhidrosis 04/17/2014  . GERD (gastroesophageal reflux disease)   . Heart murmur    Aortic Sclerosis  . Hyperlipemia   . HYPOTHYROIDISM 05/29/2007  . Irritable bowel syndrome   . Migraines    hx of   . Oxygen deficiency    2 L at bedtime  . 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 PONV (postoperative nausea and vomiting)   . PVC (premature ventricular contraction)   . Type II or unspecified type diabetes mellitus with neurological manifestations, not stated as uncontrolled(250.60) 04/07/2013  . Unspecified essential hypertension    denies htn on 05/09/16   BP 127/77   Pulse 73   Ht 5\' 6"  (1.676 m)   Wt 226 lb 9.6 oz (102.8 kg)   SpO2 95%   BMI 36.57 kg/m   Opioid Risk Score:   Fall Risk Score:  `1  Depression screen PHQ 2/9  Depression screen [facility] 2/9 04/24/2018 01/10/2018 11/24/2017 03/27/2017 02/28/2017 01/25/2017 01/02/2017  Decreased Interest 0 2 1 0 1 1 0  Down, Depressed, Hopeless 0 0 1 0 1 1 0  PHQ - 2 Score 0 2 2 0 2 2 0  Altered sleeping - 0 - - - - -  Tired, decreased energy - 1 - - - - -  Change in appetite - 0 - - - - -  Feeling bad or failure about yourself  - 0 - - - - -  Trouble concentrating - 1 - - - - -  Moving slowly or fidgety/restless - 0 - - - - -  Suicidal thoughts - 0 - - - -  -  PHQ-9 Score - 4 - - - - -  Difficult doing work/chores - Not difficult at all Not difficult at all - - - -  Some recent data might be hidden     Review of Systems  Constitutional: Negative.  HENT: Negative.   Eyes: Negative.   Respiratory: Negative.   Cardiovascular: Negative.   Gastrointestinal: Negative.   Endocrine: Negative.   Genitourinary: Negative.   Musculoskeletal: Negative.   Skin: Negative.   Allergic/Immunologic: Negative.   Neurological: Negative.   Hematological: Negative.   Psychiatric/Behavioral: Negative.   All other systems reviewed and are negative.      Objective:   Physical Exam Vitals signs and nursing note reviewed.  Constitutional:      Appearance: Normal appearance.  Neck:     Musculoskeletal: Normal range of motion and neck supple.  Cardiovascular:     Rate and Rhythm: Normal rate and regular rhythm.     Pulses: Normal pulses.     Heart sounds: Normal heart sounds.  Pulmonary:     Effort: Pulmonary effort is normal.     Breath sounds: Normal breath sounds.  Musculoskeletal:     Comments: Normal Muscle Bulk and Muscle Testing Reveals:  Upper Extremities: Full ROM and Muscle Strength 5/5 Thoracic Paraspinal Tenderness: T-7-T-9 Lumbar Hypersensitivity Lower Extremities: Full ROM and Muscle Strength 5/5 Arises from Table with ease Narrow Based Gait   Skin:    General: Skin is warm and dry.  Neurological:     Mental Status: She is alert and oriented to person, place, and time.  Psychiatric:        Mood and Affect: Mood normal.        Behavior: Behavior normal.           Assessment & Plan:  1. Cervical postlaminectomy syndrome: with neck and shoulder pain/ Chronic Midline Thoracic Pain.07/19/2018.Continue exercise routine and using heat therapy. Continue current medication regimen withMS Contin 15 mg one tablet three times a day #90.  We will continue the opioid monitoring program, this consists of regular clinic visits,  examinations, urine drug screen, pill counts as well as use of [place] Controlled Substance reporting System. 2. Fibromyalgia/ Neuropathy: Continuecurrent medication regimen withGabapentin, activity and exercise regime.07/19/2018. 3. Bilateral Knee Pain/ Mild Degenerative Changes:.Continuecurrent medication regimen withVoltaren Gel.07/19/2018. 4. Right Ankle Pain: No Complaints Today. Continueto monitor.Marland Kitchen01/23/2020. 5. Muscle Spasm: Continuecurrent medication regimen withRobaxin. 07/19/2018. 6. Opioid Induced Constipation: No complaints Continue to Monitor.0123/2020. 7. Midline Low Back Pain: Continue current medication regime, continue HEP as tolerated.07/19/2018. 8. Right Gluteal Medius Weakness and Atrophy: No complaints Today:Continue with Hip Abduction Exercisesas tolerated: Continue to Monitor.07/19/2018 9. Right Greater Trochanter Bursitis: No complaints today. Continue to Alternate Ice and Heat Therapy. Continue to Monitor. 07/19/2018 10. Sacroiliac Joint Pain: No Complaints Today. S/P Right Sacroiliac Injection on 05/21/2018  with relief noted.  20 minutes of face to face patient care time was spent during this visit. All questions were encouraged and answered.   F/U in 1 month

## 2018-07-23 ENCOUNTER — Encounter: Payer: Self-pay | Admitting: *Deleted

## 2018-07-24 ENCOUNTER — Encounter: Payer: Self-pay | Admitting: Family Medicine

## 2018-07-24 DIAGNOSIS — J9610 Chronic respiratory failure, unspecified whether with hypoxia or hypercapnia: Secondary | ICD-10-CM | POA: Diagnosis not present

## 2018-07-24 DIAGNOSIS — E119 Type 2 diabetes mellitus without complications: Secondary | ICD-10-CM

## 2018-07-24 HISTORY — DX: Type 2 diabetes mellitus without complications: E11.9

## 2018-07-24 NOTE — Progress Notes (Signed)
Dr. Frederico Hamman T. Brentlee Delage, MD, [facility] [place], [address] Phone: [phone] Fax: [phone]  07/25/2018  Patient: [name], MRN: [id number], DOB: 10/01/1950, 68 y.o.  Primary Physician:  Owens Loffler, MD   Chief Complaint  Patient presents with  . Follow-up    6 month   Subjective:   [name] is a 68 y.o. very pleasant female patient who presents with the following:  F/u multiple med probs  Pneumovax-23 Cologuard  L bunionectomy complications - chart reviewed  Diabetes Mellitus: Tolerating Medications: diet controlled right now Compliance with diet: fairly good Exercise: minimal / intermittent Avg blood sugars at home: not checking Foot problems: none Hypoglycemia: none No nausea, vomitting, blurred vision, polyuria.  Lab Results  Component Value Date   HGBA1C 6.5 (A) 07/25/2018   HGBA1C 7.0 (H) 01/10/2018   HGBA1C 6.5 01/02/2017   Lab Results  Component Value Date   MICROALBUR 1.5 01/10/2018   LDLCALC 97 01/10/2018   CREATININE 0.64 07/09/2018    Wt Readings from Last 3 Encounters:  07/25/18 225 lb 8 oz (102.3 kg)  07/19/18 226 lb 9.6 oz (102.8 kg)  06/13/18 228 lb (103.4 kg)    Body mass index is 37.53 kg/m.   HTN: Tolerating all medications without side effects Stable and at goal No CP, no sob. No HA.  BP Readings from Last 3 Encounters:  07/25/18 122/60  07/19/18 127/77  07/11/18 (!) 109/32    Basic Metabolic Panel:    Component Value Date/Time   NA 140 07/09/2018 1422   K 4.6 07/09/2018 1422   CL 98 07/09/2018 1422   CO2 28 07/09/2018 1422   BUN 13 07/09/2018 1422   CREATININE 0.64 07/09/2018 1422   GLUCOSE 184 (H) 07/09/2018 1422   GLUCOSE 188 (H) 01/10/2018 1202   CALCIUM 9.2 07/09/2018 1422    Lipids: Doing well, stable. Tolerating meds fine with no SE. Panel reviewed with patient.  Lipids:    Component Value Date/Time   CHOL 176 01/10/2018  1202   TRIG 169.0 (H) 01/10/2018 1202   HDL 44.40 01/10/2018 1202   LDLDIRECT 113.0 04/03/2013 1307   VLDL 33.8 01/10/2018 1202   CHOLHDL 4 01/10/2018 1202    Lab Results  Component Value Date   ALT 33 01/10/2018   AST 29 01/10/2018   ALKPHOS 113 01/10/2018   BILITOT 0.3 01/10/2018    Thyroid: No symptoms. Labs reviewed. Denies cold / heat intolerance, dry skin, hair loss. No goiter.  Lab Results  Component Value Date   TSH 4.20 01/10/2018    Immunization History  Administered Date(s) Administered  . Pneumococcal Conjugate-13 01/02/2017  . Pneumococcal Polysaccharide-23 05/06/2013  . Td 06/28/2003, 10/16/2013  . Zoster 11/07/2012     Past Medical History, Surgical History, Social History, Family History, Problem List, Medications, and Allergies have been reviewed and updated if relevant.  Patient Active Problem List   Diagnosis Date Noted  . Postlaminectomy syndrome, cervical region 08/29/2011    Priority: High  . FIBROMYALGIA 01/11/2007    Priority: High  . Depression, major, recurrent, in partial remission ([place]) 09/22/2008    Priority: Medium  . Hypothyroidism 05/29/2007    Priority: Medium  . HYPERLIPIDEMIA 05/29/2007    Priority: Medium  . Essential hypertension 11/13/2006    Priority: Medium  . Diabetes mellitus type 2, diet-controlled ([place]) 07/24/2018  . Status post bunionectomy 04/24/2018  . Postoperative wound infection 04/24/2018  . Lipoma of  buttock 01/10/2017  . Chronic respiratory failure with hypoxia ([place]) 04/07/2015  . Obesity hypoventilation syndrome ([place]) 03/26/2015  . Generalized hyperhidrosis 04/17/2014  . Obesity (BMI 30-39.9) 08/23/2013  . 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 Dysphagia 07/14/2010  . TRANSAMINASES, SERUM, ELEVATED 12/24/2008  . ALLERGIC RHINITIS 09/22/2008  . Vitamin D deficiency 01/11/2007  . Vitamin B 12 deficiency 11/13/2006  . GERD 11/13/2006    Past Medical History:    Diagnosis Date  . Allergy   . Anemia   . Arthritis   . B12 deficiency   . Complication of anesthesia    slow to wake up   . Depression   . Diabetes mellitus type 2, diet-controlled ([place]) 07/24/2018  . Diabetes mellitus without complication ([place])    type II - diet controlled ; A1C 6.5 01/2017  . Fibromyalgia   . Generalized hyperhidrosis 04/17/2014  . GERD (gastroesophageal reflux disease)   . Heart murmur    Aortic Sclerosis  . Hyperlipemia   . HYPOTHYROIDISM 05/29/2007  . Irritable bowel syndrome   . Migraines    hx of   . Oxygen deficiency    2 L at bedtime  . 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 PONV (postoperative nausea and vomiting)   . PVC (premature ventricular contraction)   . Type II or unspecified type diabetes mellitus with neurological manifestations, not stated as uncontrolled(250.60) 04/07/2013  . Unspecified essential hypertension    denies htn on 05/09/16    Past Surgical History:  Procedure Laterality Date  . [place] STUDY N/A 01/04/2016   Procedure: [place] STUDY;  Surgeon: Manus Gunning, MD;  Location: [facility];  Service: Gastroenterology;  Laterality: N/A;  . ABDOMINAL HYSTERECTOMY  1988  . ADENOIDECTOMY    . ANTERIOR CERVICAL DECOMP/DISCECTOMY FUSION  2009, 1993   first procedure in ~ 1993/Dr Botero  . APPENDECTOMY  1988  . BACK SURGERY    . CHOLECYSTECTOMY  2005  . ESOPHAGEAL MANOMETRY N/A 01/04/2016   Procedure: ESOPHAGEAL MANOMETRY (EM) 24 HR PH;  Surgeon: Manus Gunning, MD;  Location: [facility];  Service: Gastroenterology;  Laterality: N/A;  . ESOPHAGOGASTRODUODENOSCOPY (EGD) WITH PROPOFOL N/A 05/17/2016   Procedure: ESOPHAGOGASTRODUODENOSCOPY (EGD) WITH PROPOFOL;  Surgeon: Manus Gunning, MD;  Location: [facility];  Service: Gastroenterology;  Laterality: N/A;  . NASAL SINUS SURGERY  2006  . SAVORY DILATION N/A 05/17/2016   Procedure: SAVORY DILATION;  Surgeon: Manus Gunning, MD;  Location: [facility];  Service: Gastroenterology;  Laterality: N/A;  . TONSILLECTOMY AND ADENOIDECTOMY    . TRANSTHORACIC ECHOCARDIOGRAM  2009   Normal LV size and function. EF 60-65%. No regional wall motion and amount is. Mild aortic sclerosis, no stenosis  . TUBAL LIGATION  1979    Social History   Socioeconomic History  . Marital status: Married    Spouse name: Not on file  . Number of children: Not on file  . Years of education: Not on file  . Highest education level: Not on file  Occupational History  . Occupation: Medical laboratory scientific officer  . Financial resource strain: Not on file  . Food insecurity:    Worry: Not on file    Inability: Not on file  . Transportation needs:    Medical: Not on file    Non-medical: Not on file  Tobacco Use  . Smoking status: Never Smoker  . Smokeless tobacco: Never  Used  Substance and Sexual Activity  . Alcohol use: Yes    Alcohol/week: 0.0 standard drinks    Comment: rare  . Drug use: No  . Sexual activity: Not on file  Lifestyle  . Physical activity:    Days per week: Not on file    Minutes per session: Not on file  . Stress: Not on file  Relationships  . Social connections:    Talks on phone: Not on file    Gets together: Not on file    Attends religious service: Not on file    Active member of club or organization: Not on file    Attends meetings of clubs or organizations: Not on file    Relationship status: Not on file  . Intimate partner violence:    Fear of current or ex partner: Not on file    Emotionally abused: Not on file    Physically abused: Not on file    Forced sexual activity: Not on file  Other Topics Concern  . Not on file  Social History Narrative  . Not on file    Family History  Problem Relation Age of Onset  . Breast cancer Mother   . Diabetes Mother   . Heart disease Mother   . Allergies Mother   . Diabetes Father   . Heart disease Father   . Emphysema Father   . Allergies  Father   . Stomach cancer Maternal Uncle   . Breast cancer Paternal Aunt   . Colon cancer Neg Hx     Allergies  Allergen Reactions  . Rizatriptan Benzoate Anaphylaxis    Throat and Tongue Swelling Closed  . Sumatriptan Other (See Comments)    Throat closed & Tongue Swelling  . Tizanidine Shortness Of Breath  . Aleve [Naproxen Sodium] Hives  . Clarithromycin Hives  . Cyclobenzaprine Hcl     REACTION: THROAT CLOSED,TOUNGE SWELLING  . Doxycycline Diarrhea and Nausea And Vomiting    Reaction: unknown  . Duloxetine Nausea Only  . Keflex [Cephalexin] Itching    Sores  . Metronidazole Diarrhea and Nausea Only  . Oxycodone Hcl Diarrhea    Vomiting  . Oxycontin [Oxycodone Hcl] Nausea And Vomiting  . Penicillins     TOLERATES CEPHALOSPORINS Reaction: unknown Has patient had a PCN reaction causing immediate rash, facial/tongue/throat swelling, SOB or lightheadedness with hypotension: {unknown Has patient had a PCN reaction causing severe rash involving mucus membranes or skin necrosis: {unknown Has patient had a PCN reaction that required hospitalization {unknown Has patient had a PCN reaction occurring within the last 10 years: {no If all of the above answers are "NO", then may proceed with Cephalosporin use.  . Rosuvastatin     REACTION: MUSCLE ACHES  . Septra [Sulfamethoxazole-Trimethoprim] Other (See Comments)    Rash and itching  . Venlafaxine Hives  . Voltaren [Diclofenac Sodium] Hives    oral  . Tramadol Diarrhea, Nausea And Vomiting and Palpitations    Medication list reviewed and updated in full in [place].   GEN: No acute illnesses, no fevers, chills. GI: No n/v/d, eating normally Pulm: No SOB Interactive and getting along well at home.  Otherwise, ROS is as per the HPI.  Objective:   BP 122/60   Pulse 67   Temp 98.4 F (36.9 C) (Oral)   Ht 5\' 5"  (1.610 m)   Wt 225 lb 8 oz (102.3 kg)   SpO2 97%   BMI 37.53 kg/m   GEN: WDWN, NAD, Non-toxic,  A &  O x 3 HEENT: Atraumatic, Normocephalic. Neck supple. No masses, No LAD. Ears and Nose: No external deformity. CV: RRR, No M/G/R. No JVD. No thrill. No extra heart sounds. PULM: CTA B, no wheezes, crackles, rhonchi. No retractions. No resp. distress. No accessory muscle use. EXTR: No c/c/e NEURO Normal gait.  PSYCH: Normally interactive. Conversant. Not depressed or anxious appearing.  Calm demeanor.  L bunionectomy wound / scarring examined  Laboratory and Imaging Data: Results for orders placed or performed in visit on 07/25/18  POCT glycosylated hemoglobin (Hb A1C)  Result Value Ref Range   Hemoglobin A1C 6.5 (A) 4.0 - 5.6 %   HbA1c POC (<> result, manual entry)     HbA1c, POC (prediabetic range)     HbA1c, POC (controlled diabetic range)       Assessment and Plan:   Diabetes mellitus type 2, diet-controlled ([place]) - Plan: POCT glycosylated hemoglobin (Hb A1C)  Essential hypertension  HYPERLIPIDEMIA  Other specified hypothyroidism  Need for prophylactic vaccination against Streptococcus pneumoniae (pneumococcus) - Plan: Pneumococcal polysaccharide vaccine 23-valent greater than or equal to 2yo subcutaneous/IM  DM, HTN, lipid, thyroid are all stable  a1c 6.5  Healing from bunionectomy  Cologuard Pneumovax-23 today  Follow-up: Return in about 6 months (around 01/23/2019) for Medicare wellness.  Orders Placed This Encounter  Procedures  . Pneumococcal polysaccharide vaccine 23-valent greater than or equal to 2yo subcutaneous/IM  . POCT glycosylated hemoglobin (Hb A1C)    Signed,  Koleman Marling T. Kyran Connaughton, MD   Outpatient Encounter Medications as of 07/25/2018  Medication Sig  . B Complex Vitamins (B COMPLEX 100 PO) Take 100 mg by mouth daily.   . bisacodyl (DULCOLAX) 5 MG EC tablet Take 5 mg by mouth daily as needed for moderate constipation. Take 1-2 at hs  . Calcium Carbonate (CALCIUM 600 PO) Take 600 mg by mouth 2 (two) times daily.   . carboxymethylcellulose  (REFRESH PLUS) 0.5 % SOLN Place 1 drop into both eyes 3 (three) times daily as needed.   . cetirizine (ZYRTEC) 10 MG tablet Take 10 mg by mouth daily.  . Cholecalciferol (VITAMIN D) 2000 UNITS tablet Take 4,000 Units by mouth daily.   . citalopram (CELEXA) 40 MG tablet TAKE 1 TABLET BY MOUTH  DAILY  . Coenzyme Q-10 100 MG capsule Take 100 mg by mouth daily.  . diclofenac sodium (VOLTAREN) 1 % GEL Apply 4 g topically 4 (four) times daily as needed.  . docusate sodium (COLACE) 100 MG capsule Take 400 mg by mouth at bedtime.   . gabapentin (NEURONTIN) 300 MG capsule Take 1 capsule (300 mg total) by mouth 3 (three) times daily.  Marland Kitchen GRAPE SEED EXTRACT PO Take by mouth. 1 tablet daily  . levothyroxine (SYNTHROID, LEVOTHROID) 50 MCG tablet TAKE 1 TABLET BY MOUTH  DAILY  . MAGNESIUM GLYCINATE PLUS PO Take 400 mg by mouth at bedtime.  . methocarbamol (ROBAXIN) 500 MG tablet TAKE ONE (1) TABLET BY MOUTH EVERY EIGHTHOURS AS NEEDED FOR MUSCLE SPASMS  . mirtazapine (REMERON) 15 MG tablet TAKE 1 TABLET BY MOUTH AT  BEDTIME  . morphine (MS CONTIN) 15 MG 12 hr tablet Take 1 tablet (15 mg total) by mouth 3 (three) times daily.  . nitroGLYCERIN (NITROSTAT) 0.4 MG SL tablet Place 1 tablet (0.4 mg total) under the tongue every 5 (five) minutes as needed for chest pain.  . NON FORMULARY Daith piercing in right ear for headaches  . oxybutynin (DITROPAN-XL) 10 MG 24 hr tablet TAKE ONE  TABLET BY MOUTH EVERY NIGHT AT BEDTIME  . RABEprazole (ACIPHEX) 20 MG tablet Take 20 mg by mouth daily.  . TURMERIC PO Take 800 mg by mouth daily.   . [DISCONTINUED] metoprolol tartrate (LOPRESSOR) 50 MG tablet TAKE 100 MG ( 2 TABLETS OF 50 MG) TWO HOUR PRIOR TO  SCHEDULE CARDAIC TEST  . [DISCONTINUED] RABEprazole (ACIPHEX) 20 MG tablet TAKE 1 TABLET BY MOUTH TWICE A DAY (Patient taking differently: Take 20 mg by mouth daily. )   No facility-administered encounter medications on file as of 07/25/2018.

## 2018-07-25 ENCOUNTER — Encounter: Payer: Self-pay | Admitting: Family Medicine

## 2018-07-25 ENCOUNTER — Ambulatory Visit (INDEPENDENT_AMBULATORY_CARE_PROVIDER_SITE_OTHER): Payer: Medicare Other | Admitting: Family Medicine

## 2018-07-25 VITALS — BP 122/60 | HR 67 | Temp 98.4°F | Ht 65.0 in | Wt 225.5 lb

## 2018-07-25 DIAGNOSIS — E782 Mixed hyperlipidemia: Secondary | ICD-10-CM | POA: Diagnosis not present

## 2018-07-25 DIAGNOSIS — E038 Other specified hypothyroidism: Secondary | ICD-10-CM

## 2018-07-25 DIAGNOSIS — I10 Essential (primary) hypertension: Secondary | ICD-10-CM | POA: Diagnosis not present

## 2018-07-25 DIAGNOSIS — Z23 Encounter for immunization: Secondary | ICD-10-CM | POA: Diagnosis not present

## 2018-07-25 DIAGNOSIS — E119 Type 2 diabetes mellitus without complications: Secondary | ICD-10-CM | POA: Diagnosis not present

## 2018-07-25 LAB — POCT GLYCOSYLATED HEMOGLOBIN (HGB A1C): Hemoglobin A1C: 6.5 % — AB (ref 4.0–5.6)

## 2018-07-31 ENCOUNTER — Ambulatory Visit (INDEPENDENT_AMBULATORY_CARE_PROVIDER_SITE_OTHER): Payer: Medicare Other

## 2018-07-31 ENCOUNTER — Encounter: Payer: Self-pay | Admitting: Podiatry

## 2018-07-31 ENCOUNTER — Ambulatory Visit (INDEPENDENT_AMBULATORY_CARE_PROVIDER_SITE_OTHER): Payer: Medicare Other | Admitting: Podiatry

## 2018-07-31 DIAGNOSIS — E0843 Diabetes mellitus due to underlying condition with diabetic autonomic (poly)neuropathy: Secondary | ICD-10-CM | POA: Diagnosis not present

## 2018-07-31 DIAGNOSIS — T8131XD Disruption of external operation (surgical) wound, not elsewhere classified, subsequent encounter: Secondary | ICD-10-CM

## 2018-07-31 DIAGNOSIS — L97522 Non-pressure chronic ulcer of other part of left foot with fat layer exposed: Secondary | ICD-10-CM | POA: Diagnosis not present

## 2018-08-03 NOTE — Progress Notes (Signed)
   Subjective:  Patient presents today for follow up evaluation of a postsurgical wound dehiscence status post bunionectomy to the left foot. DOS: 02/16/2018. She states she is doing well. She states she feels as if her healing is at a stand still right now. She has been using Aquacel for treatment. Patient is here for further evaluation and treatment.   Past Medical History:  Diagnosis Date  . Allergy   . Anemia   . Arthritis   . B12 deficiency   . Complication of anesthesia    slow to wake up   . Depression   . Diabetes mellitus type 2, diet-controlled ([place]) 07/24/2018  . Diabetes mellitus without complication ([place])    type II - diet controlled ; A1C 6.5 01/2017  . Fibromyalgia   . Generalized hyperhidrosis 04/17/2014  . GERD (gastroesophageal reflux disease)   . Heart murmur    Aortic Sclerosis  . Hyperlipemia   . HYPOTHYROIDISM 05/29/2007  . Irritable bowel syndrome   . Migraines    hx of   . Oxygen deficiency    2 L at bedtime  . 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 PONV (postoperative nausea and vomiting)   . PVC (premature ventricular contraction)   . Type II or unspecified type diabetes mellitus with neurological manifestations, not stated as uncontrolled(250.60) 04/07/2013  . Unspecified essential hypertension    denies htn on 05/09/16     Objective/Physical Exam Neurovascular status intact. Pedal pulses are palpable. Neurological sensation intact. Significant amount of dehiscence of the [place] bunionectomy site measuring approximately 2.0 x 0.9 x 0.2 cm (LxWxD).  There is some improvement in the size of the wound today. To the surgical wound dehiscence there is some stable eschar noted around the area likely consistent with a small vessel disease. No malodor noted. There continues to be no exposed bone, joint, or hardware noted.  There continues to be exposed EHL tendon. Negative for any significant erythema or edema at the moment. Incision  site overlying the second MTPJ left foot appears to have a dry stable eschar overlying the incision site. No drainage noted. No malodor noted.  Radiographic Exam:  Orthopedic hardware and osteotomies sites appear to be stable with routine healing.  Assessment: 1. s/p [place] bunionectomy and metatarsal osteotomy second digit left foot.. DOS: 02/16/2018 2. Surgical wound dehiscence possibly secondary to small vessel disease.  Plan of Care:  1. Patient was evaluated. X-Rays reviewed.  2. Medically necessary excisional debridement including subcutaneous tissue was performed using a tissue nipper and a chisel blade. Excisional debridement of all the necrotic nonviable tissue down to healthy bleeding viable tissue was performed with post-debridement measurements same as pre-. 3. The wound was cleansed and dry sterile dressing applied. 4. Recommended collagen and Aquacel Ag daily with a bandage.  5. Continue wearing good shoe gear.  6. Return to clinic in 4 weeks.   Edrick Kins, DPM [facility]  Dr. Edrick Kins, [place]                                        [place], [place] [address]                Office [phone]  Fax [phone]

## 2018-08-05 DIAGNOSIS — Z1212 Encounter for screening for malignant neoplasm of rectum: Secondary | ICD-10-CM | POA: Diagnosis not present

## 2018-08-05 DIAGNOSIS — Z1211 Encounter for screening for malignant neoplasm of colon: Secondary | ICD-10-CM | POA: Diagnosis not present

## 2018-08-05 LAB — COLOGUARD: Cologuard: NEGATIVE

## 2018-08-07 ENCOUNTER — Telehealth: Payer: Self-pay | Admitting: Registered Nurse

## 2018-08-07 NOTE — Telephone Encounter (Signed)
[name] called requesting SI injection, last injection was on 05/21/2018, note was reviewed. [name] reports increase intensity of right hip and sacral pain.  Discussed with Dr. Letta Pate. [name] will be scheduled for an appointment on 08/08/2018, with [facility]. Placed call to [name] regarding the above, she verbalizes understanding.

## 2018-08-08 ENCOUNTER — Encounter: Payer: Self-pay | Admitting: Registered Nurse

## 2018-08-08 ENCOUNTER — Other Ambulatory Visit: Payer: Self-pay

## 2018-08-08 ENCOUNTER — Encounter: Payer: Medicare Other | Attending: Physical Medicine & Rehabilitation | Admitting: Registered Nurse

## 2018-08-08 VITALS — BP 122/74 | HR 68 | Ht 66.0 in | Wt 229.0 lb

## 2018-08-08 DIAGNOSIS — M542 Cervicalgia: Secondary | ICD-10-CM

## 2018-08-08 DIAGNOSIS — Z5181 Encounter for therapeutic drug level monitoring: Secondary | ICD-10-CM

## 2018-08-08 DIAGNOSIS — M961 Postlaminectomy syndrome, not elsewhere classified: Secondary | ICD-10-CM

## 2018-08-08 DIAGNOSIS — Z79899 Other long term (current) drug therapy: Secondary | ICD-10-CM | POA: Diagnosis not present

## 2018-08-08 DIAGNOSIS — G894 Chronic pain syndrome: Secondary | ICD-10-CM | POA: Diagnosis not present

## 2018-08-08 DIAGNOSIS — M7061 Trochanteric bursitis, right hip: Secondary | ICD-10-CM

## 2018-08-08 DIAGNOSIS — M533 Sacrococcygeal disorders, not elsewhere classified: Secondary | ICD-10-CM | POA: Diagnosis not present

## 2018-08-08 DIAGNOSIS — M546 Pain in thoracic spine: Secondary | ICD-10-CM | POA: Diagnosis not present

## 2018-08-08 DIAGNOSIS — G8929 Other chronic pain: Secondary | ICD-10-CM

## 2018-08-08 NOTE — Progress Notes (Signed)
Subjective:    Patient ID: [name], female    DOB: 1950/07/14, 68 y.o.   MRN: [id number]  HPI: [name] is a 68 y.o. female who returns for follow up appointment for chronic pain and medication refill. She states her  pain is located in her neck and mid- back,. Also reports increase intensity of right hip pain radiating into her right buttock and sacral tenderness. She has failed NSAID and opioids, at this point we recommend Sacroiliac Injection, this was discussed with Dr. Letta Pate and he agrees with plan.  She rates her  pain 6. Her current exercise regime is walking short distances due to increase intensity of right hip pain.   [name] Morphine equivalent is 45.00  MME.   Pain Inventory Average Pain 6 Pain Right Now 6 My pain is intermittent and sharp  In the last 24 hours, has pain interfered with the following? General activity 8 Relation with others 7 Enjoyment of life 6 What TIME of day is your pain at its worst? all Sleep (in general) Fair  Pain is worse with: walking, bending and standing Pain improves with: rest, heat/ice and injections Relief from Meds: 7  Mobility walk without assistance how many minutes can you walk? 15 ability to climb steps?  no do you drive?  yes  Function disabled: date disabled 12/2008 retired I need assistance with the following:  meal prep, household duties and shopping  Neuro/Psych bladder control problems weakness numbness spasms depression  Prior Studies Any changes since last visit?  no  Physicians involved in your care Any changes since last visit?  no   Family History  Problem Relation Age of Onset  . Breast cancer Mother   . Diabetes Mother   . Heart disease Mother   . Allergies Mother   . Diabetes Father   . Heart disease Father   . Emphysema Father   . Allergies Father   . Stomach cancer Maternal Uncle   . Breast cancer Paternal Aunt   . Colon cancer Neg Hx    Social History   Socioeconomic  History  . Marital status: Married    Spouse name: Not on file  . Number of children: Not on file  . Years of education: Not on file  . Highest education level: Not on file  Occupational History  . Occupation: Medical laboratory scientific officer  . Financial resource strain: Not on file  . Food insecurity:    Worry: Not on file    Inability: Not on file  . Transportation needs:    Medical: Not on file    Non-medical: Not on file  Tobacco Use  . Smoking status: Never Smoker  . Smokeless tobacco: Never Used  Substance and Sexual Activity  . Alcohol use: Yes    Alcohol/week: 0.0 standard drinks    Comment: rare  . Drug use: No  . Sexual activity: Not on file  Lifestyle  . Physical activity:    Days per week: Not on file    Minutes per session: Not on file  . Stress: Not on file  Relationships  . Social connections:    Talks on phone: Not on file    Gets together: Not on file    Attends religious service: Not on file    Active member of club or organization: Not on file    Attends meetings of clubs or organizations: Not on file    Relationship status: Not on file  Other Topics  Concern  . Not on file  Social History Narrative  . Not on file   Past Surgical History:  Procedure Laterality Date  . [place] STUDY N/A 01/04/2016   Procedure: [place] STUDY;  Surgeon: Manus Gunning, MD;  Location: [facility];  Service: Gastroenterology;  Laterality: N/A;  . ABDOMINAL HYSTERECTOMY  1988  . ADENOIDECTOMY    . ANTERIOR CERVICAL DECOMP/DISCECTOMY FUSION  2009, 1993   first procedure in ~ 1993/Dr Botero  . APPENDECTOMY  1988  . BACK SURGERY    . CHOLECYSTECTOMY  2005  . ESOPHAGEAL MANOMETRY N/A 01/04/2016   Procedure: ESOPHAGEAL MANOMETRY (EM) 24 HR PH;  Surgeon: Manus Gunning, MD;  Location: [facility];  Service: Gastroenterology;  Laterality: N/A;  . ESOPHAGOGASTRODUODENOSCOPY (EGD) WITH PROPOFOL N/A 05/17/2016   Procedure: ESOPHAGOGASTRODUODENOSCOPY (EGD) WITH  PROPOFOL;  Surgeon: Manus Gunning, MD;  Location: [facility];  Service: Gastroenterology;  Laterality: N/A;  . NASAL SINUS SURGERY  2006  . SAVORY DILATION N/A 05/17/2016   Procedure: SAVORY DILATION;  Surgeon: Manus Gunning, MD;  Location: [facility];  Service: Gastroenterology;  Laterality: N/A;  . TONSILLECTOMY AND ADENOIDECTOMY    . TRANSTHORACIC ECHOCARDIOGRAM  2009   Normal LV size and function. EF 60-65%. No regional wall motion and amount is. Mild aortic sclerosis, no stenosis  . TUBAL LIGATION  1979   Past Medical History:  Diagnosis Date  . Allergy   . Anemia   . Arthritis   . B12 deficiency   . Complication of anesthesia    slow to wake up   . Depression   . Diabetes mellitus type 2, diet-controlled ([place]) 07/24/2018  . Diabetes mellitus without complication ([place])    type II - diet controlled ; A1C 6.5 01/2017  . Fibromyalgia   . Generalized hyperhidrosis 04/17/2014  . GERD (gastroesophageal reflux disease)   . Heart murmur    Aortic Sclerosis  . Hyperlipemia   . HYPOTHYROIDISM 05/29/2007  . Irritable bowel syndrome   . Migraines    hx of   . Oxygen deficiency    2 L at bedtime  . 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 PONV (postoperative nausea and vomiting)   . PVC (premature ventricular contraction)   . Type II or unspecified type diabetes mellitus with neurological manifestations, not stated as uncontrolled(250.60) 04/07/2013  . Unspecified essential hypertension    denies htn on 05/09/16   BP 122/74   Pulse 68   Ht 5\' 6"  (1.676 m)   Wt 229 lb (103.9 kg)   SpO2 97%   BMI 36.96 kg/m   Opioid Risk Score:   Fall Risk Score:  `1  Depression screen PHQ 2/9  Depression screen [facility] 2/9 08/08/2018 04/24/2018 01/10/2018 11/24/2017 03/27/2017 02/28/2017 01/25/2017  Decreased Interest 0 0 2 1 0 1 1  Down, Depressed, Hopeless 0 0 0 1 0 1 1  PHQ - 2 Score 0 0 2 2 0 2 2  Altered sleeping - - 0 - - - -  Tired, decreased  energy - - 1 - - - -  Change in appetite - - 0 - - - -  Feeling bad or failure about yourself  - - 0 - - - -  Trouble concentrating - - 1 - - - -  Moving slowly or fidgety/restless - - 0 - - - -  Suicidal thoughts - - 0 - - - -  PHQ-9 Score - - 4 - - - -  Difficult doing work/chores - - Not difficult at all Not difficult at all - - -  Some recent data might be hidden   Review of Systems  Constitutional: Negative.   HENT: Negative.   Eyes: Negative.   Respiratory: Negative.   Cardiovascular: Negative.   Gastrointestinal: Positive for nausea.  Endocrine: Negative.   Genitourinary: Negative.   Musculoskeletal: Negative.   Skin: Negative.   Allergic/Immunologic: Negative.   Neurological: Negative.   Hematological: Negative.   Psychiatric/Behavioral: Negative.   All other systems reviewed and are negative.      Objective:   Physical Exam Vitals signs and nursing note reviewed.  Constitutional:      Appearance: Normal appearance.  Neck:     Musculoskeletal: Normal range of motion and neck supple.     Comments: Cervical Paraspinal Tenderness: C-5-C-6 Mainly Right Side.  Cardiovascular:     Rate and Rhythm: Normal rate and regular rhythm.     Pulses: Normal pulses.     Heart sounds: Normal heart sounds.  Pulmonary:     Effort: Pulmonary effort is normal.     Breath sounds: Normal breath sounds.  Musculoskeletal:     Comments: Normal Muscle Bulk and Muscle Testing Reveals:  Upper Extremities: Full ROM and Muscle Strength 5/5 Right AC Joint Tenderness  Thoracic Paraspinal Tenderness: T-7-T-9 Right Greater Trochanter Tenderness Sacral Tenderness Noted  Lower Extremities: Full ROM and Muscle Strength 5/5 Right Lower Extremity Flexion Produces Pain into Lumbar and Right Hip Left Lower Extremity Flexion Produces Pain into Right Hip Arises from Table Slowly Narrow Based Gait   Skin:    General: Skin is warm and dry.  Neurological:     Mental Status: She is alert and  oriented to person, place, and time.  Psychiatric:        Mood and Affect: Mood normal.        Behavior: Behavior normal.           Assessment & Plan:  1. Cervical postlaminectomy syndrome: with neck and shoulder pain/ Chronic Midline Thoracic Pain.08/08/2018.Continue exercise routine and using heat therapy. Continue current medication regimen withMS Contin 15 mg one tablet three times a day #90.  We will continue the opioid monitoring program, this consists of regular clinic visits, examinations, urine drug screen, pill counts as well as use of [place] Controlled Substance reporting System. 2. Fibromyalgia/ Neuropathy: Continuecurrent medication regimen withGabapentin, activity and exercise regime.08/08/2018. 3. Bilateral Knee Pain/ Mild Degenerative Changes:.Continuecurrent medication regimen withVoltaren Gel.08/08/2018. 4. Right Ankle Pain: No Complaints Today. Continueto monitor.08/08/2018. 5. Muscle Spasm: Continuecurrent medication regimen withRobaxin. 08/08/2018. 6. Opioid Induced Constipation: No complaints Continue to Monitor.08/08/2018. 7. Midline Low Back Pain: Continue current medication regime, continue HEP as tolerated.08/08/2018. 8. Right Gluteal Medius Weakness and Atrophy: No complaints Today:Continue with Hip Abduction Exercisesas tolerated: Continue to Monitor.08/08/2018 9. Right Greater Trochanter Bursitis:Continue to Alternate Ice and Heat Therapy. Continue to Monitor. 08/08/2018 10. Sacroiliac Joint Pain: Schedule Sacroiliac Injection with Dr. Letta Pate. S/P Right Sacroiliac Injection on 05/21/2018  with good relief noted.  20 minutes of face to face patient care time was spent during this visit. All questions were encouraged and answered.   F/U in 1 month

## 2018-08-14 ENCOUNTER — Encounter: Payer: Self-pay | Admitting: Family Medicine

## 2018-08-17 ENCOUNTER — Ambulatory Visit: Payer: Medicare Other | Admitting: Physical Medicine & Rehabilitation

## 2018-08-20 ENCOUNTER — Ambulatory Visit: Payer: Medicare Other | Admitting: Physical Medicine & Rehabilitation

## 2018-08-20 ENCOUNTER — Ambulatory Visit: Payer: Medicare Other | Admitting: Cardiology

## 2018-08-22 ENCOUNTER — Ambulatory Visit: Payer: Medicare Other | Admitting: Registered Nurse

## 2018-08-24 ENCOUNTER — Other Ambulatory Visit: Payer: Self-pay | Admitting: *Deleted

## 2018-08-24 ENCOUNTER — Ambulatory Visit: Payer: Medicare Other | Admitting: Physical Medicine & Rehabilitation

## 2018-08-24 VITALS — BP 121/69 | HR 67 | Ht 66.0 in | Wt 226.0 lb

## 2018-08-24 DIAGNOSIS — G8929 Other chronic pain: Secondary | ICD-10-CM

## 2018-08-24 DIAGNOSIS — Z79899 Other long term (current) drug therapy: Secondary | ICD-10-CM | POA: Diagnosis not present

## 2018-08-24 DIAGNOSIS — J9610 Chronic respiratory failure, unspecified whether with hypoxia or hypercapnia: Secondary | ICD-10-CM | POA: Diagnosis not present

## 2018-08-24 DIAGNOSIS — M533 Sacrococcygeal disorders, not elsewhere classified: Secondary | ICD-10-CM | POA: Diagnosis not present

## 2018-08-24 DIAGNOSIS — Z5181 Encounter for therapeutic drug level monitoring: Secondary | ICD-10-CM | POA: Diagnosis not present

## 2018-08-24 DIAGNOSIS — G894 Chronic pain syndrome: Secondary | ICD-10-CM | POA: Diagnosis not present

## 2018-08-24 MED ORDER — OXYBUTYNIN CHLORIDE ER 10 MG PO TB24: 10.0000 mg | ORAL_TABLET | Freq: Every day | ORAL | 3 refills | Status: DC

## 2018-08-24 MED ORDER — MORPHINE SULFATE ER 15 MG PO TBCR: 15.0000 mg | EXTENDED_RELEASE_TABLET | Freq: Three times a day (TID) | ORAL | 0 refills | Status: DC

## 2018-08-24 NOTE — Progress Notes (Signed)
  PROCEDURE RECORD [place] [facility]   Name: [name] DOB:01-09-1951 MRN: [id number]  Date:08/24/2018  Physician: Alysia Penna, MD    Nurse/CMA: Truman Hayward, CMA  Allergies:  Allergies  Allergen Reactions  . Rizatriptan Benzoate Anaphylaxis    Throat and Tongue Swelling Closed  . Sumatriptan Other (See Comments)    Throat closed & Tongue Swelling  . Tizanidine Shortness Of Breath  . Aleve [Naproxen Sodium] Hives  . Clarithromycin Hives  . Cyclobenzaprine Hcl     REACTION: THROAT CLOSED,TOUNGE SWELLING  . Doxycycline Diarrhea and Nausea And Vomiting    Reaction: unknown  . Duloxetine Nausea Only  . Keflex [Cephalexin] Itching    Sores  . Metronidazole Diarrhea and Nausea Only  . Oxycodone Hcl Diarrhea    Vomiting  . Oxycontin [Oxycodone Hcl] Nausea And Vomiting  . Penicillins     TOLERATES CEPHALOSPORINS Reaction: unknown Has patient had a PCN reaction causing immediate rash, facial/tongue/throat swelling, SOB or lightheadedness with hypotension: {unknown Has patient had a PCN reaction causing severe rash involving mucus membranes or skin necrosis: {unknown Has patient had a PCN reaction that required hospitalization {unknown Has patient had a PCN reaction occurring within the last 10 years: {no If all of the above answers are "NO", then may proceed with Cephalosporin use.  . Rosuvastatin     REACTION: MUSCLE ACHES  . Septra [Sulfamethoxazole-Trimethoprim] Other (See Comments)    Rash and itching  . Venlafaxine Hives  . Voltaren [Diclofenac Sodium] Hives    oral  . Tramadol Diarrhea, Nausea And Vomiting and Palpitations    Consent Signed: Yes.    Is patient diabetic? No.  CBG today?  Pregnant: No. LMP: No LMP recorded. Patient has had a hysterectomy. (age 28-55)  Anticoagulants: no Anti-inflammatory: yes (Ibuprofen) Antibiotics: no  Procedure: Right Sacroiliac Joint Injection Position: Prone Start Time: 2:24pm End Time:  2:27pm Fluoro Time: 13  RN/CMA Truman Hayward, CMA  Collie Kittel, CMA    Time 1:59pm 2:35pm    BP 121/69 131/72    Pulse 67 75    Respirations 16 16    O2 Sat 96 94    S/S 6 6    Pain Level 6/10 3/10     D/C home with husband , patient A & O X 3, D/C instructions reviewed, and sits independently.

## 2018-08-24 NOTE — Patient Instructions (Signed)
Sacroiliac injection was performed today. A combination of a naming medicine plus a cortisone medicine was injected. The injection was done under x-ray guidance. This procedure has been performed to help reduce low back and buttocks pain as well as potentially hip pain. The duration of this injection is variable lasting from hours to  Months. It may repeated if needed. 

## 2018-08-24 NOTE — Progress Notes (Signed)

## 2018-08-28 ENCOUNTER — Encounter: Payer: Self-pay | Admitting: Podiatry

## 2018-08-28 ENCOUNTER — Ambulatory Visit (INDEPENDENT_AMBULATORY_CARE_PROVIDER_SITE_OTHER): Payer: Medicare Other | Admitting: Podiatry

## 2018-08-28 DIAGNOSIS — E0843 Diabetes mellitus due to underlying condition with diabetic autonomic (poly)neuropathy: Secondary | ICD-10-CM

## 2018-08-28 DIAGNOSIS — L97522 Non-pressure chronic ulcer of other part of left foot with fat layer exposed: Secondary | ICD-10-CM

## 2018-08-30 ENCOUNTER — Other Ambulatory Visit: Payer: Self-pay | Admitting: *Deleted

## 2018-08-30 MED ORDER — GABAPENTIN 300 MG PO CAPS: 300.0000 mg | ORAL_CAPSULE | Freq: Three times a day (TID) | ORAL | 2 refills | Status: DC

## 2018-08-30 NOTE — Progress Notes (Signed)
   Subjective:  Patient presents today for follow up evaluation of a postsurgical wound dehiscence status post bunionectomy to the left foot. DOS: 02/16/2018. She states she has improved significantly. She has been using collagen and Aquacel Ag for treatment. There are no modifying factors noted. Patient is here for further evaluation and treatment.   Past Medical History:  Diagnosis Date  . Allergy   . Anemia   . Arthritis   . B12 deficiency   . Complication of anesthesia    slow to wake up   . Depression   . Diabetes mellitus type 2, diet-controlled ([place]) 07/24/2018  . Diabetes mellitus without complication ([place])    type II - diet controlled ; A1C 6.5 01/2017  . Fibromyalgia   . Generalized hyperhidrosis 04/17/2014  . GERD (gastroesophageal reflux disease)   . Heart murmur    Aortic Sclerosis  . Hyperlipemia   . HYPOTHYROIDISM 05/29/2007  . Irritable bowel syndrome   . Migraines    hx of   . Oxygen deficiency    2 L at bedtime  . 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 PONV (postoperative nausea and vomiting)   . PVC (premature ventricular contraction)   . Type II or unspecified type diabetes mellitus with neurological manifestations, not stated as uncontrolled(250.60) 04/07/2013  . Unspecified essential hypertension    denies htn on 05/09/16     Objective/Physical Exam Neurovascular status intact. Pedal pulses are palpable. Neurological sensation intact. Wound noted to the surgical wound has healed. Complete re-epithelialization has occurred. No drainage noted.    Assessment: 1. s/p [place] bunionectomy and metatarsal osteotomy second digit left foot.. DOS: 02/16/2018 2. Surgical wound dehiscence possibly secondary to small vessel disease. - healed   Plan of Care:  1. Patient was evaluated.  2. Recommended good shoe gear.  3. May resume full activity with no restrictions.  4. Return to clinic as needed.   Edrick Kins, DPM [facility]  Dr. Edrick Kins, [place]                                        [place], [place] [address]                Office [phone]  Fax [phone]

## 2018-09-21 ENCOUNTER — Ambulatory Visit (HOSPITAL_BASED_OUTPATIENT_CLINIC_OR_DEPARTMENT_OTHER): Payer: Medicare Other | Admitting: Physical Medicine & Rehabilitation

## 2018-09-21 ENCOUNTER — Other Ambulatory Visit: Payer: Self-pay

## 2018-09-21 ENCOUNTER — Encounter: Payer: Medicare Other | Attending: Physical Medicine & Rehabilitation

## 2018-09-21 ENCOUNTER — Encounter: Payer: Self-pay | Admitting: Physical Medicine & Rehabilitation

## 2018-09-21 VITALS — BP 151/75 | Temp 98.2°F | Ht 65.5 in | Wt 220.0 lb

## 2018-09-21 DIAGNOSIS — M961 Postlaminectomy syndrome, not elsewhere classified: Secondary | ICD-10-CM

## 2018-09-21 DIAGNOSIS — G894 Chronic pain syndrome: Secondary | ICD-10-CM | POA: Insufficient documentation

## 2018-09-21 DIAGNOSIS — G8929 Other chronic pain: Secondary | ICD-10-CM | POA: Diagnosis not present

## 2018-09-21 DIAGNOSIS — M546 Pain in thoracic spine: Secondary | ICD-10-CM | POA: Diagnosis not present

## 2018-09-21 DIAGNOSIS — Z79899 Other long term (current) drug therapy: Secondary | ICD-10-CM | POA: Insufficient documentation

## 2018-09-21 DIAGNOSIS — M533 Sacrococcygeal disorders, not elsewhere classified: Secondary | ICD-10-CM

## 2018-09-21 DIAGNOSIS — Z5181 Encounter for therapeutic drug level monitoring: Secondary | ICD-10-CM | POA: Insufficient documentation

## 2018-09-21 MED ORDER — MORPHINE SULFATE ER 15 MG PO TBCR: 15.0000 mg | EXTENDED_RELEASE_TABLET | Freq: Three times a day (TID) | ORAL | 0 refills | Status: DC

## 2018-09-21 NOTE — Progress Notes (Signed)
Subjective:    Patient ID: [name], female    DOB: 03/09/1951, 68 y.o.   MRN: [id number]  HPI Right sacroiliac injection 08/24/2018 was very helpful no more spasms  Chronic neck pain, worse with rainy weather  MS Contin 15mg  po TID for neck and mid back pain Uses Ice pack at time and occ Voltaren gel Uses Dulcolax tab one per day  Pt walks outside 3-4 day per wk , 1/4-1/2 mile  Uses voltaren gel only prn for knee pain, knees are doing pretty well, depends on how active pt is Pain Inventory Average Pain 6 Pain Right Now 5 My pain is constant, dull and aching  In the last 24 hours, has pain interfered with the following? General activity 5 Relation with others 5 Enjoyment of life 5 What TIME of day is your pain at its worst? night Sleep (in general) Good  Pain is worse with: walking, bending, standing and some activites Pain improves with: rest, heat/ice and medication Relief from Meds: 6  Mobility walk without assistance ability to climb steps?  yes do you drive?  yes  Function disabled: date disabled 2010 retired  Neuro/Psych bladder control problems weakness numbness tremor tingling spasms depression  Prior Studies Any changes since last visit?  no  Physicians involved in your care Any changes since last visit?  no   Family History  Problem Relation Age of Onset  . Breast cancer Mother   . Diabetes Mother   . Heart disease Mother   . Allergies Mother   . Diabetes Father   . Heart disease Father   . Emphysema Father   . Allergies Father   . Stomach cancer Maternal Uncle   . Breast cancer Paternal Aunt   . Colon cancer Neg Hx    Social History   Socioeconomic History  . Marital status: Married    Spouse name: Not on file  . Number of children: Not on file  . Years of education: Not on file  . Highest education level: Not on file  Occupational History  . Occupation: Medical laboratory scientific officer  . Financial resource strain: Not on  file  . Food insecurity:    Worry: Not on file    Inability: Not on file  . Transportation needs:    Medical: Not on file    Non-medical: Not on file  Tobacco Use  . Smoking status: Never Smoker  . Smokeless tobacco: Never Used  Substance and Sexual Activity  . Alcohol use: Yes    Alcohol/week: 0.0 standard drinks    Comment: rare  . Drug use: No  . Sexual activity: Not on file  Lifestyle  . Physical activity:    Days per week: Not on file    Minutes per session: Not on file  . Stress: Not on file  Relationships  . Social connections:    Talks on phone: Not on file    Gets together: Not on file    Attends religious service: Not on file    Active member of club or organization: Not on file    Attends meetings of clubs or organizations: Not on file    Relationship status: Not on file  Other Topics Concern  . Not on file  Social History Narrative  . Not on file   Past Surgical History:  Procedure Laterality Date  . [place] STUDY N/A 01/04/2016   Procedure: [place] STUDY;  Surgeon: Manus Gunning, MD;  Location:  [facility];  Service: Gastroenterology;  Laterality: N/A;  . ABDOMINAL HYSTERECTOMY  1988  . ADENOIDECTOMY    . ANTERIOR CERVICAL DECOMP/DISCECTOMY FUSION  2009, 1993   first procedure in ~ 1993/Dr Botero  . APPENDECTOMY  1988  . BACK SURGERY    . CHOLECYSTECTOMY  2005  . ESOPHAGEAL MANOMETRY N/A 01/04/2016   Procedure: ESOPHAGEAL MANOMETRY (EM) 24 HR PH;  Surgeon: Manus Gunning, MD;  Location: [facility];  Service: Gastroenterology;  Laterality: N/A;  . ESOPHAGOGASTRODUODENOSCOPY (EGD) WITH PROPOFOL N/A 05/17/2016   Procedure: ESOPHAGOGASTRODUODENOSCOPY (EGD) WITH PROPOFOL;  Surgeon: Manus Gunning, MD;  Location: [facility];  Service: Gastroenterology;  Laterality: N/A;  . NASAL SINUS SURGERY  2006  . SAVORY DILATION N/A 05/17/2016   Procedure: SAVORY DILATION;  Surgeon: Manus Gunning, MD;  Location: [facility];   Service: Gastroenterology;  Laterality: N/A;  . TONSILLECTOMY AND ADENOIDECTOMY    . TRANSTHORACIC ECHOCARDIOGRAM  2009   Normal LV size and function. EF 60-65%. No regional wall motion and amount is. Mild aortic sclerosis, no stenosis  . TUBAL LIGATION  1979   Past Medical History:  Diagnosis Date  . Allergy   . Anemia   . Arthritis   . B12 deficiency   . Complication of anesthesia    slow to wake up   . Depression   . Diabetes mellitus type 2, diet-controlled ([place]) 07/24/2018  . Diabetes mellitus without complication ([place])    type II - diet controlled ; A1C 6.5 01/2017  . Fibromyalgia   . Generalized hyperhidrosis 04/17/2014  . GERD (gastroesophageal reflux disease)   . Heart murmur    Aortic Sclerosis  . Hyperlipemia   . HYPOTHYROIDISM 05/29/2007  . Irritable bowel syndrome   . Migraines    hx of   . Oxygen deficiency    2 L at bedtime  . 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 PONV (postoperative nausea and vomiting)   . PVC (premature ventricular contraction)   . Type II or unspecified type diabetes mellitus with neurological manifestations, not stated as uncontrolled(250.60) 04/07/2013  . Unspecified essential hypertension    denies htn on 05/09/16   There were no vitals taken for this visit.  Opioid Risk Score:   Fall Risk Score:  `1  Depression screen PHQ 2/9  Depression screen [facility] 2/9 08/08/2018 04/24/2018 01/10/2018 11/24/2017 03/27/2017 02/28/2017 01/25/2017  Decreased Interest 0 0 2 1 0 1 1  Down, Depressed, Hopeless 0 0 0 1 0 1 1  PHQ - 2 Score 0 0 2 2 0 2 2  Altered sleeping - - 0 - - - -  Tired, decreased energy - - 1 - - - -  Change in appetite - - 0 - - - -  Feeling bad or failure about yourself  - - 0 - - - -  Trouble concentrating - - 1 - - - -  Moving slowly or fidgety/restless - - 0 - - - -  Suicidal thoughts - - 0 - - - -  PHQ-9 Score - - 4 - - - -  Difficult doing work/chores - - Not difficult at all Not difficult at all -  - -  Some recent data might be hidden     Review of Systems  Constitutional: Negative.   HENT: Negative.   Eyes: Negative.   Respiratory: Negative.   Cardiovascular: Negative.   Gastrointestinal: Negative.   Endocrine: Negative.  Genitourinary: Negative.   Musculoskeletal: Positive for arthralgias, back pain and myalgias.  Skin: Negative.   Allergic/Immunologic: Negative.   Neurological: Positive for tremors, weakness and numbness.  Hematological: Negative.   Psychiatric/Behavioral: Positive for dysphoric mood.  All other systems reviewed and are negative.      Objective:   Physical Exam  Virtual visit      Assessment & Plan:  1.  Cervical postlaminectomy syndrome as well as chronic pain in the mid back from thoracic degenerative disc we will continue morphine extended release 15 mg 3 times daily no signs of aberrant drug behavior Urine toxicology last performed 03/21/2018 and was appropriate.  May repeat next visit PDMP reviewed  2.  Right sacroiliac pain improved after injection we discussed working on her hip strengthening exercise but it does not sound like she is very compliant with this. This can be repeated anytime after Nov 22, 2018.

## 2018-09-22 DIAGNOSIS — J9610 Chronic respiratory failure, unspecified whether with hypoxia or hypercapnia: Secondary | ICD-10-CM | POA: Diagnosis not present

## 2018-09-28 NOTE — Research (Signed)
Cadfem Informed Consent   **Deleted in error patient was consented by Waynetta Sandy**  Patient Name: [name]    Subject met inclusion and exclusion criteria.  The informed consent form, study requirements and expectations were reviewed with the subject and questions and concerns were addressed prior to the signing of the consent form.  The subject verbalized understanding of the trail requirements.  The subject agreed to participate in the CADFEM trial and signed the informed consent.  The informed consent was obtained prior to performance of any protocol-specific procedures for the subject.  A copy of the signed informed consent was given to the subject and a copy was placed in the subject's medical record.   Neva Seat

## 2018-10-17 ENCOUNTER — Other Ambulatory Visit: Payer: Self-pay | Admitting: Family Medicine

## 2018-10-19 ENCOUNTER — Other Ambulatory Visit: Payer: Self-pay

## 2018-10-19 ENCOUNTER — Encounter: Payer: Self-pay | Admitting: Registered Nurse

## 2018-10-19 ENCOUNTER — Encounter: Payer: Medicare Other | Attending: Physical Medicine & Rehabilitation | Admitting: Registered Nurse

## 2018-10-19 VITALS — BP 132/66 | Temp 98.2°F | Ht 66.0 in | Wt 228.0 lb

## 2018-10-19 DIAGNOSIS — M25562 Pain in left knee: Secondary | ICD-10-CM

## 2018-10-19 DIAGNOSIS — Z5181 Encounter for therapeutic drug level monitoring: Secondary | ICD-10-CM

## 2018-10-19 DIAGNOSIS — M961 Postlaminectomy syndrome, not elsewhere classified: Secondary | ICD-10-CM

## 2018-10-19 DIAGNOSIS — G894 Chronic pain syndrome: Secondary | ICD-10-CM | POA: Diagnosis not present

## 2018-10-19 DIAGNOSIS — M546 Pain in thoracic spine: Secondary | ICD-10-CM

## 2018-10-19 DIAGNOSIS — M25561 Pain in right knee: Secondary | ICD-10-CM | POA: Diagnosis not present

## 2018-10-19 DIAGNOSIS — G8929 Other chronic pain: Secondary | ICD-10-CM

## 2018-10-19 DIAGNOSIS — Z79899 Other long term (current) drug therapy: Secondary | ICD-10-CM

## 2018-10-19 MED ORDER — MORPHINE SULFATE ER 15 MG PO TBCR: 15.0000 mg | EXTENDED_RELEASE_TABLET | Freq: Three times a day (TID) | ORAL | 0 refills | Status: DC

## 2018-10-19 NOTE — Progress Notes (Signed)
Subjective:    Patient ID: [name], female    DOB: 10-29-1950, 68 y.o.   MRN: [id number]  HPI: [name] is a 68 y.o. female her appointment was changed, due to national recommendations of social distancing due to [place] 19, an audio/video telehealth visit is felt to be most appropriate for this patient at this time.  See Chart message from today for the patient's consent to telehealth from [facility].     She states her pain is located in her mid- back and bilateral knees. She rates her pain 5. Her current exercise regime is walking and performing stretching exercises.  Ms. Jakes Morphine equivalent is 45.00 MME. Last Oral Swab was Performed on 03/21/2018, it was consistent.   [name] CMA asked the Health and History Questions. This provider and [name]   verified we were speaking with the correct person using two identifiers.   Pain Inventory Average Pain 6 Pain Right Now 5 My pain is burning and dull  In the last 24 hours, has pain interfered with the following? General activity 6 Relation with others 6 Enjoyment of life 5 What TIME of day is your pain at its worst? evening Sleep (in general) Good  Pain is worse with: walking, bending, standing and some activites Pain improves with: rest, heat/ice and medication Relief from Meds: 6  Mobility walk without assistance ability to climb steps?  yes do you drive?  yes  Function disabled: date disabled 2010 retired  Neuro/Psych bladder control problems weakness numbness tremor tingling spasms depression  Prior Studies Any changes since last visit?  no  Physicians involved in your care Any changes since last visit?  no   Family History  Problem Relation Age of Onset  . Breast cancer Mother   . Diabetes Mother   . Heart disease Mother   . Allergies Mother   . Diabetes Father   . Heart disease Father   . Emphysema Father   . Allergies Father   .  Stomach cancer Maternal Uncle   . Breast cancer Paternal Aunt   . Colon cancer Neg Hx    Social History   Socioeconomic History  . Marital status: Married    Spouse name: Not on file  . Number of children: Not on file  . Years of education: Not on file  . Highest education level: Not on file  Occupational History  . Occupation: Medical laboratory scientific officer  . Financial resource strain: Not on file  . Food insecurity:    Worry: Not on file    Inability: Not on file  . Transportation needs:    Medical: Not on file    Non-medical: Not on file  Tobacco Use  . Smoking status: Never Smoker  . Smokeless tobacco: Never Used  Substance and Sexual Activity  . Alcohol use: Yes    Alcohol/week: 0.0 standard drinks    Comment: rare  . Drug use: No  . Sexual activity: Not on file  Lifestyle  . Physical activity:    Days per week: Not on file    Minutes per session: Not on file  . Stress: Not on file  Relationships  . Social connections:    Talks on phone: Not on file    Gets together: Not on file    Attends religious service: Not on file    Active member of club or organization: Not on file    Attends meetings of clubs or  organizations: Not on file    Relationship status: Not on file  Other Topics Concern  . Not on file  Social History Narrative  . Not on file   Past Surgical History:  Procedure Laterality Date  . [place] STUDY N/A 01/04/2016   Procedure: [place] STUDY;  Surgeon: Manus Gunning, MD;  Location: [facility];  Service: Gastroenterology;  Laterality: N/A;  . ABDOMINAL HYSTERECTOMY  1988  . ADENOIDECTOMY    . ANTERIOR CERVICAL DECOMP/DISCECTOMY FUSION  2009, 1993   first procedure in ~ 1993/Dr Botero  . APPENDECTOMY  1988  . BACK SURGERY    . CHOLECYSTECTOMY  2005  . ESOPHAGEAL MANOMETRY N/A 01/04/2016   Procedure: ESOPHAGEAL MANOMETRY (EM) 24 HR PH;  Surgeon: Manus Gunning, MD;  Location: [facility];  Service: Gastroenterology;   Laterality: N/A;  . ESOPHAGOGASTRODUODENOSCOPY (EGD) WITH PROPOFOL N/A 05/17/2016   Procedure: ESOPHAGOGASTRODUODENOSCOPY (EGD) WITH PROPOFOL;  Surgeon: Manus Gunning, MD;  Location: [facility];  Service: Gastroenterology;  Laterality: N/A;  . NASAL SINUS SURGERY  2006  . SAVORY DILATION N/A 05/17/2016   Procedure: SAVORY DILATION;  Surgeon: Manus Gunning, MD;  Location: [facility];  Service: Gastroenterology;  Laterality: N/A;  . TONSILLECTOMY AND ADENOIDECTOMY    . TRANSTHORACIC ECHOCARDIOGRAM  2009   Normal LV size and function. EF 60-65%. No regional wall motion and amount is. Mild aortic sclerosis, no stenosis  . TUBAL LIGATION  1979   Past Medical History:  Diagnosis Date  . Allergy   . Anemia   . Arthritis   . B12 deficiency   . Complication of anesthesia    slow to wake up   . Depression   . Diabetes mellitus type 2, diet-controlled ([place]) 07/24/2018  . Diabetes mellitus without complication ([place])    type II - diet controlled ; A1C 6.5 01/2017  . Fibromyalgia   . Generalized hyperhidrosis 04/17/2014  . GERD (gastroesophageal reflux disease)   . Heart murmur    Aortic Sclerosis  . Hyperlipemia   . HYPOTHYROIDISM 05/29/2007  . Irritable bowel syndrome   . Migraines    hx of   . Oxygen deficiency    2 L at bedtime  . 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 PONV (postoperative nausea and vomiting)   . PVC (premature ventricular contraction)   . Type II or unspecified type diabetes mellitus with neurological manifestations, not stated as uncontrolled(250.60) 04/07/2013  . Unspecified essential hypertension    denies htn on 05/09/16   There were no vitals taken for this visit.  Opioid Risk Score:   Fall Risk Score:  `1  Depression screen PHQ 2/9  Depression screen [facility] 2/9 08/08/2018 04/24/2018 01/10/2018 11/24/2017 03/27/2017 02/28/2017 01/25/2017  Decreased Interest 0 0 2 1 0 1 1  Down, Depressed, Hopeless 0 0 0 1 0 1 1  PHQ  - 2 Score 0 0 2 2 0 2 2  Altered sleeping - - 0 - - - -  Tired, decreased energy - - 1 - - - -  Change in appetite - - 0 - - - -  Feeling bad or failure about yourself  - - 0 - - - -  Trouble concentrating - - 1 - - - -  Moving slowly or fidgety/restless - - 0 - - - -  Suicidal thoughts - - 0 - - - -  PHQ-9 Score - - 4 - - - -  Difficult doing work/chores - - Not difficult at all Not difficult at all - - -  Some recent data might be hidden     Review of Systems  Constitutional: Negative.   HENT: Positive for rhinorrhea.   Eyes: Positive for itching.  Respiratory: Negative.   Cardiovascular: Negative.   Gastrointestinal: Negative.   Endocrine: Negative.   Genitourinary: Negative.   Musculoskeletal: Positive for arthralgias and myalgias.  Allergic/Immunologic: Positive for environmental allergies.  Neurological: Positive for tremors, weakness and numbness.  Hematological: Negative.   Psychiatric/Behavioral: Negative.   All other systems reviewed and are negative.      Objective:   Physical Exam Vitals signs and nursing note reviewed.  Musculoskeletal:     Comments: No Physical Performed: Virtual Visit  Neurological:     Mental Status: She is oriented to person, place, and time.           Assessment & Plan:  1. Cervical postlaminectomy syndrome: with neck and shoulder pain/ Chronic Midline Thoracic Pain.10/19/2018.Continue exercise routine and using heat therapy. Refilled: current medication regimen withMS Contin 15 mg one tablet three times a day #90.  We will continue the opioid monitoring program, this consists of regular clinic visits, examinations, urine drug screen, pill counts as well as use of [place] Controlled Substance reporting System. 2. Fibromyalgia/ Neuropathy: Continuecurrent medication regimen withGabapentin, activity and exercise regime.10/19/2018. 3. Bilateral Knee Pain/ Mild Degenerative Changes:.Continuecurrent medication regimen  withVoltaren Gel.10/19/2018. 4. Right Ankle Pain: No Complaints Today. Continueto monitor.10/19/2018. 5. Muscle Spasm: Continuecurrent medication regimen withRobaxin. 10/19/2018. 6. Opioid Induced Constipation: No complaints Continue to Monitor.10/19/2018. 7. Midline Low Back Pain: Continue current medication regime, continue HEP as tolerated.10/19/2018. 8. Right Gluteal Medius Weakness and Atrophy: No complaints Today:Continue with Hip Abduction Exercisesas tolerated: Continue to Monitor.08/08/2018 9. Right Greater Trochanter Bursitis:Continue to Alternate Ice and Heat Therapy. Continue to Monitor. 08/08/2018 10. Sacroiliac Joint Pain:S/P Sacroiliac Injection on 08/24/2018 with good relief noted. 10/19/2018  F/U in 1 month Telephone Call  Location of patient:In her Home Location of provider: Office Established patient Time spent on call: 10 minutes

## 2018-10-23 DIAGNOSIS — J9610 Chronic respiratory failure, unspecified whether with hypoxia or hypercapnia: Secondary | ICD-10-CM | POA: Diagnosis not present

## 2018-11-13 ENCOUNTER — Other Ambulatory Visit: Payer: Self-pay

## 2018-11-13 ENCOUNTER — Encounter: Payer: Self-pay | Admitting: Registered Nurse

## 2018-11-13 ENCOUNTER — Encounter: Payer: Medicare Other | Attending: Physical Medicine & Rehabilitation | Admitting: Registered Nurse

## 2018-11-13 VITALS — BP 135/74 | HR 69 | Resp 14 | Ht 66.0 in | Wt 227.0 lb

## 2018-11-13 DIAGNOSIS — Z79899 Other long term (current) drug therapy: Secondary | ICD-10-CM | POA: Diagnosis not present

## 2018-11-13 DIAGNOSIS — M961 Postlaminectomy syndrome, not elsewhere classified: Secondary | ICD-10-CM | POA: Diagnosis not present

## 2018-11-13 DIAGNOSIS — Z5181 Encounter for therapeutic drug level monitoring: Secondary | ICD-10-CM | POA: Diagnosis not present

## 2018-11-13 DIAGNOSIS — M545 Low back pain, unspecified: Secondary | ICD-10-CM

## 2018-11-13 DIAGNOSIS — M25562 Pain in left knee: Secondary | ICD-10-CM

## 2018-11-13 DIAGNOSIS — M5412 Radiculopathy, cervical region: Secondary | ICD-10-CM

## 2018-11-13 DIAGNOSIS — G8929 Other chronic pain: Secondary | ICD-10-CM

## 2018-11-13 DIAGNOSIS — M25561 Pain in right knee: Secondary | ICD-10-CM

## 2018-11-13 DIAGNOSIS — M25571 Pain in right ankle and joints of right foot: Secondary | ICD-10-CM

## 2018-11-13 DIAGNOSIS — M25572 Pain in left ankle and joints of left foot: Secondary | ICD-10-CM

## 2018-11-13 DIAGNOSIS — G894 Chronic pain syndrome: Secondary | ICD-10-CM

## 2018-11-13 DIAGNOSIS — M542 Cervicalgia: Secondary | ICD-10-CM

## 2018-11-13 DIAGNOSIS — M546 Pain in thoracic spine: Secondary | ICD-10-CM

## 2018-11-13 DIAGNOSIS — Z79891 Long term (current) use of opiate analgesic: Secondary | ICD-10-CM

## 2018-11-13 MED ORDER — MORPHINE SULFATE ER 15 MG PO TBCR: 15.0000 mg | EXTENDED_RELEASE_TABLET | Freq: Three times a day (TID) | ORAL | 0 refills | Status: DC

## 2018-11-13 NOTE — Progress Notes (Signed)
Subjective:    Patient ID: [name], female    DOB: 1950-10-20, 68 y.o.   MRN: [id number]  HPI: [name] is a 68 y.o. female who returns for follow up appointment for chronic pain and medication refill. She states her pain is located in her neck radiating into right shoulder, mid-back, bilateral knees, bilateral ankles and pain in her 3rd and 4th digit of left toes. Her current exercise regime is walking.   [name] Morphine equivalent is 45.00 MME.  Last Oral Swab was Performed on 03/21/2018,  See note for details. Oral Swab ordered today.    Pain Inventory Average Pain 5 Pain Right Now 5 My pain is constant, burning and aching  In the last 24 hours, has pain interfered with the following? General activity 6 Relation with others 6 Enjoyment of life 5 What TIME of day is your pain at its worst? evening, night Sleep (in general) Good  Pain is worse with: walking, bending, standing and some activites Pain improves with: rest, heat/ice, pacing activities, TENS and injections Relief from Meds: 5  Mobility walk without assistance how many minutes can you walk? 15 ability to climb steps?  yes do you drive?  yes Do you have any goals in this area?  yes  Function disabled: date disabled . retired I need assistance with the following:  meal prep, household duties and shopping Do you have any goals in this area?  yes  Neuro/Psych bladder control problems weakness numbness tremor depression  Prior Studies Any changes since last visit?  no  Physicians involved in your care Any changes since last visit?  no   Family History  Problem Relation Age of Onset  . Breast cancer Mother   . Diabetes Mother   . Heart disease Mother   . Allergies Mother   . Diabetes Father   . Heart disease Father   . Emphysema Father   . Allergies Father   . Stomach cancer Maternal Uncle   . Breast cancer Paternal Aunt   . Colon cancer Neg Hx    Social History    Socioeconomic History  . Marital status: Married    Spouse name: Not on file  . Number of children: Not on file  . Years of education: Not on file  . Highest education level: Not on file  Occupational History  . Occupation: Medical laboratory scientific officer  . Financial resource strain: Not on file  . Food insecurity:    Worry: Not on file    Inability: Not on file  . Transportation needs:    Medical: Not on file    Non-medical: Not on file  Tobacco Use  . Smoking status: Never Smoker  . Smokeless tobacco: Never Used  Substance and Sexual Activity  . Alcohol use: Yes    Alcohol/week: 0.0 standard drinks    Comment: rare  . Drug use: No  . Sexual activity: Not on file  Lifestyle  . Physical activity:    Days per week: Not on file    Minutes per session: Not on file  . Stress: Not on file  Relationships  . Social connections:    Talks on phone: Not on file    Gets together: Not on file    Attends religious service: Not on file    Active member of club or organization: Not on file    Attends meetings of clubs or organizations: Not on file    Relationship status: Not on file  Other Topics Concern  . Not on file  Social History Narrative  . Not on file   Past Surgical History:  Procedure Laterality Date  . [place] STUDY N/A 01/04/2016   Procedure: [place] STUDY;  Surgeon: Manus Gunning, MD;  Location: [facility];  Service: Gastroenterology;  Laterality: N/A;  . ABDOMINAL HYSTERECTOMY  1988  . ADENOIDECTOMY    . ANTERIOR CERVICAL DECOMP/DISCECTOMY FUSION  2009, 1993   first procedure in ~ 1993/Dr Botero  . APPENDECTOMY  1988  . BACK SURGERY    . CHOLECYSTECTOMY  2005  . ESOPHAGEAL MANOMETRY N/A 01/04/2016   Procedure: ESOPHAGEAL MANOMETRY (EM) 24 HR PH;  Surgeon: Manus Gunning, MD;  Location: [facility];  Service: Gastroenterology;  Laterality: N/A;  . ESOPHAGOGASTRODUODENOSCOPY (EGD) WITH PROPOFOL N/A 05/17/2016   Procedure:  ESOPHAGOGASTRODUODENOSCOPY (EGD) WITH PROPOFOL;  Surgeon: Manus Gunning, MD;  Location: [facility];  Service: Gastroenterology;  Laterality: N/A;  . NASAL SINUS SURGERY  2006  . SAVORY DILATION N/A 05/17/2016   Procedure: SAVORY DILATION;  Surgeon: Manus Gunning, MD;  Location: [facility];  Service: Gastroenterology;  Laterality: N/A;  . TONSILLECTOMY AND ADENOIDECTOMY    . TRANSTHORACIC ECHOCARDIOGRAM  2009   Normal LV size and function. EF 60-65%. No regional wall motion and amount is. Mild aortic sclerosis, no stenosis  . TUBAL LIGATION  1979   Past Medical History:  Diagnosis Date  . Allergy   . Anemia   . Arthritis   . B12 deficiency   . Complication of anesthesia    slow to wake up   . Depression   . Diabetes mellitus type 2, diet-controlled ([place]) 07/24/2018  . Diabetes mellitus without complication ([place])    type II - diet controlled ; A1C 6.5 01/2017  . Fibromyalgia   . Generalized hyperhidrosis 04/17/2014  . GERD (gastroesophageal reflux disease)   . Heart murmur    Aortic Sclerosis  . Hyperlipemia   . HYPOTHYROIDISM 05/29/2007  . Irritable bowel syndrome   . Migraines    hx of   . Oxygen deficiency    2 L at bedtime  . 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 PONV (postoperative nausea and vomiting)   . PVC (premature ventricular contraction)   . Type II or unspecified type diabetes mellitus with neurological manifestations, not stated as uncontrolled(250.60) 04/07/2013  . Unspecified essential hypertension    denies htn on 05/09/16   BP 135/74   Pulse 69   Resp 14   Ht 5\' 6"  (1.676 m)   Wt 227 lb (103 kg)   SpO2 96%   BMI 36.64 kg/m   Opioid Risk Score:   Fall Risk Score:  `1  Depression screen PHQ 2/9  Depression screen [facility] 2/9 08/08/2018 04/24/2018 01/10/2018 11/24/2017 03/27/2017 02/28/2017 01/25/2017  Decreased Interest 0 0 2 1 0 1 1  Down, Depressed, Hopeless 0 0 0 1 0 1 1  PHQ - 2 Score 0 0 2 2 0 2 2   Altered sleeping - - 0 - - - -  Tired, decreased energy - - 1 - - - -  Change in appetite - - 0 - - - -  Feeling bad or failure about yourself  - - 0 - - - -  Trouble concentrating - - 1 - - - -  Moving slowly or fidgety/restless - - 0 - - - -  Suicidal thoughts - -  0 - - - -  PHQ-9 Score - - 4 - - - -  Difficult doing work/chores - - Not difficult at all Not difficult at all - - -  Some recent data might be hidden    Review of Systems  Constitutional: Negative.   HENT: Negative.   Eyes: Negative.   Respiratory: Positive for apnea.   Cardiovascular: Positive for leg swelling.  Gastrointestinal: Negative.   Endocrine: Negative.   Genitourinary: Negative.   Musculoskeletal: Positive for back pain and neck pain.  Skin: Negative.   Allergic/Immunologic: Negative.   Neurological: Positive for tremors, weakness and numbness.  Hematological: Negative.   Psychiatric/Behavioral: Positive for dysphoric mood.       Objective:   Physical Exam Vitals signs and nursing note reviewed.  Constitutional:      Appearance: Normal appearance.  Neck:     Musculoskeletal: Normal range of motion and neck supple.     Comments: Cervical Paraspinal Tenderness: C-5-C-6 Cardiovascular:     Pulses: Normal pulses.     Heart sounds: Normal heart sounds.  Pulmonary:     Effort: Pulmonary effort is normal.     Breath sounds: Normal breath sounds.  Musculoskeletal:     Comments: Normal Muscle Bulk and Muscle Testing Reveals:  Upper Extremities: Full ROM and Muscle Strength 5/5 Thoracic Paraspinal Tenderness: T-7-T-9  Lumbar Paraspinal Tenderness: L-4-L-5 Mainly Left Side   Lower Extremities: Full ROM and Muscle Strength 5/5/ Arises from Table with Ease Narrow Based Gait   Skin:    General: Skin is warm and dry.     Findings: Lesion:    Neurological:     Mental Status: She is alert and oriented to person, place, and time.           Assessment & Plan:  1. Cervical postlaminectomy  syndrome:/ Cervicalgia/ Cervical Radiculitis/ with neck and shoulder pain/ Chronic Midline Thoracic Pain.11/13/2018.Continue exercise routine and using heat therapy. Refilled: current medication regimen withMS Contin 15 mg one tablet three times a day #90.  We will continue the opioid monitoring program, this consists of regular clinic visits, examinations, urine drug screen, pill counts as well as use of [place] Controlled Substance reporting System. 2. Fibromyalgia/ Neuropathy: Continuecurrent medication regimen withGabapentin, activity and exercise regime.11/13/2018. 3. Bilateral Knee Pain/ Mild Degenerative Changes:.Continuecurrent medication regimen withVoltaren Gel.11/13/2018. 4. Bilateral  Ankle Pain:  Continue HEP as tolerated. Continueto monitor.11/13/2018. 5. Muscle Spasm: Continuecurrent medication regimen withRobaxin. 11/13/2018. 6. Opioid Induced Constipation: No complaints Continue to Monitor.11/13/2018. 7. Midline Low Back Pain: Continue current medication regime, continue HEP as tolerated.10/19/2018. 8. Right Gluteal Medius Weakness and Atrophy: No complaints Today:Continue with Hip Abduction Exercisesas tolerated: Continue to Monitor.11/13/2018 9. Right Greater Trochanter Bursitis: No Complaints Today. Continue to Alternate Ice and Heat Therapy. Continue to Monitor. 11/13/2018 10. Sacroiliac Joint Pain:S/P Sacroiliac Injection on 08/24/2018 with good relief noted. 11/13/2018 11. Chronic left sided low back pain without sciatica: Continue HEP as tolerated. Continue current medication regimen. Continue to monitor.   F/U in 1 month

## 2018-11-15 ENCOUNTER — Encounter: Payer: Self-pay | Admitting: Podiatry

## 2018-11-16 LAB — DRUG TOX MONITOR 1 W/CONF, ORAL FLD

## 2018-11-16 LAB — DRUG TOX ALC METAB W/CON, ORAL FLD: Alcohol Metabolite: NEGATIVE ng/mL (ref <25)

## 2018-11-20 ENCOUNTER — Telehealth: Payer: Self-pay | Admitting: *Deleted

## 2018-11-20 NOTE — Telephone Encounter (Signed)
Oral swab drug screen was negative for prescribed medications. This is the second swab (an only swabs done) that has been negative for Mrs [name].  She will need to be urine test only.

## 2018-11-22 DIAGNOSIS — J9610 Chronic respiratory failure, unspecified whether with hypoxia or hypercapnia: Secondary | ICD-10-CM | POA: Diagnosis not present

## 2018-11-25 ENCOUNTER — Encounter: Payer: Self-pay | Admitting: Podiatry

## 2018-11-26 ENCOUNTER — Encounter: Payer: Self-pay | Admitting: Family Medicine

## 2018-11-28 NOTE — Telephone Encounter (Signed)
Dr. Please advise if you think patient needs to come back in soon

## 2018-12-19 ENCOUNTER — Encounter: Payer: Self-pay | Admitting: Registered Nurse

## 2018-12-19 ENCOUNTER — Other Ambulatory Visit: Payer: Self-pay

## 2018-12-19 ENCOUNTER — Encounter: Payer: Medicare Other | Attending: Physical Medicine & Rehabilitation | Admitting: Registered Nurse

## 2018-12-19 ENCOUNTER — Other Ambulatory Visit: Payer: Self-pay | Admitting: Family Medicine

## 2018-12-19 VITALS — BP 124/76 | HR 64 | Temp 97.5°F | Ht 66.0 in | Wt 223.0 lb

## 2018-12-19 DIAGNOSIS — M25562 Pain in left knee: Secondary | ICD-10-CM

## 2018-12-19 DIAGNOSIS — M546 Pain in thoracic spine: Secondary | ICD-10-CM

## 2018-12-19 DIAGNOSIS — G8929 Other chronic pain: Secondary | ICD-10-CM

## 2018-12-19 DIAGNOSIS — M542 Cervicalgia: Secondary | ICD-10-CM | POA: Diagnosis not present

## 2018-12-19 DIAGNOSIS — M5412 Radiculopathy, cervical region: Secondary | ICD-10-CM

## 2018-12-19 DIAGNOSIS — M545 Low back pain, unspecified: Secondary | ICD-10-CM

## 2018-12-19 DIAGNOSIS — G894 Chronic pain syndrome: Secondary | ICD-10-CM | POA: Diagnosis not present

## 2018-12-19 DIAGNOSIS — Z5181 Encounter for therapeutic drug level monitoring: Secondary | ICD-10-CM | POA: Diagnosis not present

## 2018-12-19 DIAGNOSIS — Z79899 Other long term (current) drug therapy: Secondary | ICD-10-CM | POA: Diagnosis not present

## 2018-12-19 DIAGNOSIS — M961 Postlaminectomy syndrome, not elsewhere classified: Secondary | ICD-10-CM | POA: Diagnosis not present

## 2018-12-19 MED ORDER — MORPHINE SULFATE ER 15 MG PO TBCR: 15.0000 mg | EXTENDED_RELEASE_TABLET | Freq: Three times a day (TID) | ORAL | 0 refills | Status: DC

## 2018-12-19 NOTE — Progress Notes (Signed)
Subjective:    Patient ID: [name], female    DOB: Oct 30, 1950, 68 y.o.   MRN: [id number]  HPI: [name] is a 68 y.o. female who returns for follow up appointment for chronic pain and medication refill. She states her pain is located in her neck radiating into her right shoulder, mid- back pain and left knee pain. She  Rates her  Pain 5. Her current exercise regime is walking and performing stretching exercises.  Ms. Schnitzer Morphine equivalent is 45.00MME.  Last Oral Swab was performed on 11/13/2018, see note for details.   Pain Inventory Average Pain 5 Pain Right Now 5 My pain is constant, burning and aching  In the last 24 hours, has pain interfered with the following? General activity 5 Relation with others 6 Enjoyment of life 5 What TIME of day is your pain at its worst? night Sleep (in general) Fair  Pain is worse with: walking, bending, standing and some activites Pain improves with: rest, heat/ice, therapy/exercise, pacing activities, medication, TENS and injections Relief from Meds: 5  Mobility walk without assistance ability to climb steps?  yes do you drive?  yes  Function retired I need assistance with the following:  meal prep, household duties and shopping  Neuro/Psych bladder control problems weakness numbness tremor depression  Prior Studies Any changes since last visit?  no  Physicians involved in your care Any changes since last visit?  no   Family History  Problem Relation Age of Onset  . Breast cancer Mother   . Diabetes Mother   . Heart disease Mother   . Allergies Mother   . Diabetes Father   . Heart disease Father   . Emphysema Father   . Allergies Father   . Stomach cancer Maternal Uncle   . Breast cancer Paternal Aunt   . Colon cancer Neg Hx    Social History   Socioeconomic History  . Marital status: Married    Spouse name: Not on file  . Number of children: Not on file  . Years of education: Not on file  .  Highest education level: Not on file  Occupational History  . Occupation: Medical laboratory scientific officer  . Financial resource strain: Not on file  . Food insecurity    Worry: Not on file    Inability: Not on file  . Transportation needs    Medical: Not on file    Non-medical: Not on file  Tobacco Use  . Smoking status: Never Smoker  . Smokeless tobacco: Never Used  Substance and Sexual Activity  . Alcohol use: Yes    Alcohol/week: 0.0 standard drinks    Comment: rare  . Drug use: No  . Sexual activity: Not on file  Lifestyle  . Physical activity    Days per week: Not on file    Minutes per session: Not on file  . Stress: Not on file  Relationships  . Social Herbalist on phone: Not on file    Gets together: Not on file    Attends religious service: Not on file    Active member of club or organization: Not on file    Attends meetings of clubs or organizations: Not on file    Relationship status: Not on file  Other Topics Concern  . Not on file  Social History Narrative  . Not on file   Past Surgical History:  Procedure Laterality Date  . [place] STUDY N/A 01/04/2016  Procedure: [place] STUDY;  Surgeon: Manus Gunning, MD;  Location: [facility];  Service: Gastroenterology;  Laterality: N/A;  . ABDOMINAL HYSTERECTOMY  1988  . ADENOIDECTOMY    . ANTERIOR CERVICAL DECOMP/DISCECTOMY FUSION  2009, 1993   first procedure in ~ 1993/Dr Botero  . APPENDECTOMY  1988  . BACK SURGERY    . CHOLECYSTECTOMY  2005  . ESOPHAGEAL MANOMETRY N/A 01/04/2016   Procedure: ESOPHAGEAL MANOMETRY (EM) 24 HR PH;  Surgeon: Manus Gunning, MD;  Location: [facility];  Service: Gastroenterology;  Laterality: N/A;  . ESOPHAGOGASTRODUODENOSCOPY (EGD) WITH PROPOFOL N/A 05/17/2016   Procedure: ESOPHAGOGASTRODUODENOSCOPY (EGD) WITH PROPOFOL;  Surgeon: Manus Gunning, MD;  Location: [facility];  Service: Gastroenterology;  Laterality: N/A;  . NASAL SINUS SURGERY   2006  . SAVORY DILATION N/A 05/17/2016   Procedure: SAVORY DILATION;  Surgeon: Manus Gunning, MD;  Location: [facility];  Service: Gastroenterology;  Laterality: N/A;  . TONSILLECTOMY AND ADENOIDECTOMY    . TRANSTHORACIC ECHOCARDIOGRAM  2009   Normal LV size and function. EF 60-65%. No regional wall motion and amount is. Mild aortic sclerosis, no stenosis  . TUBAL LIGATION  1979   Past Medical History:  Diagnosis Date  . Allergy   . Anemia   . Arthritis   . B12 deficiency   . Complication of anesthesia    slow to wake up   . Depression   . Diabetes mellitus type 2, diet-controlled ([place]) 07/24/2018  . Diabetes mellitus without complication ([place])    type II - diet controlled ; A1C 6.5 01/2017  . Fibromyalgia   . Generalized hyperhidrosis 04/17/2014  . GERD (gastroesophageal reflux disease)   . Heart murmur    Aortic Sclerosis  . Hyperlipemia   . HYPOTHYROIDISM 05/29/2007  . Irritable bowel syndrome   . Migraines    hx of   . Oxygen deficiency    2 L at bedtime  . 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 PONV (postoperative nausea and vomiting)   . PVC (premature ventricular contraction)   . Type II or unspecified type diabetes mellitus with neurological manifestations, not stated as uncontrolled(250.60) 04/07/2013  . Unspecified essential hypertension    denies htn on 05/09/16   There were no vitals taken for this visit.  Opioid Risk Score:   Fall Risk Score:  `1  Depression screen PHQ 2/9  Depression screen [facility] 2/9 08/08/2018 04/24/2018 01/10/2018 11/24/2017 03/27/2017 02/28/2017 01/25/2017  Decreased Interest 0 0 2 1 0 1 1  Down, Depressed, Hopeless 0 0 0 1 0 1 1  PHQ - 2 Score 0 0 2 2 0 2 2  Altered sleeping - - 0 - - - -  Tired, decreased energy - - 1 - - - -  Change in appetite - - 0 - - - -  Feeling bad or failure about yourself  - - 0 - - - -  Trouble concentrating - - 1 - - - -  Moving slowly or fidgety/restless - - 0 - - - -   Suicidal thoughts - - 0 - - - -  PHQ-9 Score - - 4 - - - -  Difficult doing work/chores - - Not difficult at all Not difficult at all - - -  Some recent data might be hidden     Review of Systems  Constitutional: Negative.   HENT: Negative.   Eyes: Negative.   Respiratory: Negative.  Cardiovascular: Negative.   Gastrointestinal: Negative.   Endocrine: Negative.   Genitourinary: Positive for difficulty urinating.  Musculoskeletal: Positive for arthralgias, myalgias, neck pain and neck stiffness.  Skin: Negative.   Allergic/Immunologic: Negative.   Neurological: Positive for tremors, weakness and numbness.  Hematological: Negative.   Psychiatric/Behavioral: Positive for dysphoric mood.  All other systems reviewed and are negative.      Objective:   Physical Exam Vitals signs and nursing note reviewed.  Constitutional:      Appearance: Normal appearance.  Neck:     Musculoskeletal: Normal range of motion and neck supple.  Cardiovascular:     Rate and Rhythm: Normal rate and regular rhythm.     Pulses: Normal pulses.     Heart sounds: Normal heart sounds.  Pulmonary:     Effort: Pulmonary effort is normal.     Breath sounds: Normal breath sounds.  Musculoskeletal:     Comments: Normal Muscle Bulk and Muscle Testing Reveals:  Upper Extremities: Full ROM and Muscle Strength 5/5 Right AC Joint Tenderness  Thoracic Paraspinal Tenderness: T-7-T-9  Lower Extremities: Full ROM and Muscle Strength 5/5 Arises from table with ease Narrow Based Gait   Skin:    General: Skin is warm and dry.  Neurological:     Mental Status: She is alert and oriented to person, place, and time.  Psychiatric:        Mood and Affect: Mood normal.        Behavior: Behavior normal.           Assessment & Plan:  1. Cervical postlaminectomy syndrome:/ Cervicalgia/ Cervical Radiculitis/ with neck and shoulder pain/ Chronic Midline Thoracic Pain.12/19/2018.Continue exercise routine and  using heat therapy.Refilled:current medication regimen withMS Contin 15 mg one tablet three times a day #90.  We will continue the opioid monitoring program, this consists of regular clinic visits, examinations, urine drug screen, pill counts as well as use of [place] Controlled Substance reporting System. 2. Fibromyalgia/ Neuropathy: Continuecurrent medication regimen withGabapentin, activity and exercise regime.12/19/2018. 3. Left  Knee Pain/ Mild Degenerative Changes:.Continuecurrent medication regimen withVoltaren Gel.12/19/2018. 4. Bilateral  Ankle Pain:  No complaints today. Continue HEP as tolerated. Continueto monitor.12/19/2018. 5. Muscle Spasm: Continuecurrent medication regimen withRobaxin. 12/19/2018. 6. Opioid Induced Constipation: No complaints Continue to Monitor.12/19/2018. 7. Midline Low Back Pain: Continue current medication regime, continue HEP as tolerated.12/19/2018. 8. Right Gluteal Medius Weakness and Atrophy: No complaints Today:Continue with Hip Abduction Exercisesas tolerated: Continue to Monitor.11/13/2018 9. Right Greater Trochanter Bursitis: No Complaints Today. Continue to Alternate Ice and Heat Therapy. Continue to Monitor. 12/19/2018 10. Sacroiliac Joint Pain:No complaints today. S/PSacroiliac Injection on 08/24/2018 with good relief noted. 12/19/2018 11. Chronic left sided low back pain without sciatica: No complaints today. Continue HEP as tolerated. Continue current medication regimen. Continue to monitor.  12/19/2018.  F/U in 1 month

## 2018-12-23 DIAGNOSIS — J9610 Chronic respiratory failure, unspecified whether with hypoxia or hypercapnia: Secondary | ICD-10-CM | POA: Diagnosis not present

## 2019-01-08 ENCOUNTER — Encounter: Payer: Self-pay | Admitting: Podiatry

## 2019-01-08 ENCOUNTER — Ambulatory Visit (INDEPENDENT_AMBULATORY_CARE_PROVIDER_SITE_OTHER): Payer: Medicare Other | Admitting: Podiatry

## 2019-01-08 ENCOUNTER — Ambulatory Visit (INDEPENDENT_AMBULATORY_CARE_PROVIDER_SITE_OTHER): Payer: Medicare Other

## 2019-01-08 ENCOUNTER — Other Ambulatory Visit: Payer: Self-pay

## 2019-01-08 DIAGNOSIS — M778 Other enthesopathies, not elsewhere classified: Secondary | ICD-10-CM

## 2019-01-08 DIAGNOSIS — M7752 Other enthesopathy of left foot: Secondary | ICD-10-CM

## 2019-01-08 DIAGNOSIS — M779 Enthesopathy, unspecified: Secondary | ICD-10-CM | POA: Diagnosis not present

## 2019-01-14 ENCOUNTER — Telehealth: Payer: Self-pay | Admitting: Family Medicine

## 2019-01-14 NOTE — Progress Notes (Signed)
   HPI: 68 y.o. female presenting today for evaluation of left foot pain to the ball of her foot.  Patient has a history of bunionectomy surgery with some wound dehiscence.  Date of surgery 02/16/2018.  The wounds have completely healed however over time she has developed some pain and tenderness to the third toe left foot.  She says it is very painful and she cannot bend or straighten out the toe.  She presents for further treatment and evaluation  Past Medical History:  Diagnosis Date  . Allergy   . Anemia   . Arthritis   . B12 deficiency   . Complication of anesthesia    slow to wake up   . Depression   . Diabetes mellitus type 2, diet-controlled ([place]) 07/24/2018  . Diabetes mellitus without complication ([place])    type II - diet controlled ; A1C 6.5 01/2017  . Fibromyalgia   . Generalized hyperhidrosis 04/17/2014  . GERD (gastroesophageal reflux disease)   . Heart murmur    Aortic Sclerosis  . Hyperlipemia   . HYPOTHYROIDISM 05/29/2007  . Irritable bowel syndrome   . Migraines    hx of   . Oxygen deficiency    2 L at bedtime  . 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 PONV (postoperative nausea and vomiting)   . PVC (premature ventricular contraction)   . Type II or unspecified type diabetes mellitus with neurological manifestations, not stated as uncontrolled(250.60) 04/07/2013  . Unspecified essential hypertension    denies htn on 05/09/16     Physical Exam: General: The patient is alert and oriented x3 in no acute distress.  Dermatology: Skin is warm, dry and supple bilateral lower extremities. Negative for open lesions or macerations.  Vascular: Palpable pedal pulses bilaterally. No edema or erythema noted. Capillary refill within normal limits.  Neurological: Epicritic and protective threshold grossly intact bilaterally.   Musculoskeletal Exam: Range of motion within normal limits to all pedal and ankle joints bilateral. Muscle strength 5/5 in  all groups bilateral.  Pain on palpation and range of motion noted to the third MTPJ also pain to the metatarsal head plantarly.  Radiographic Exam:  Normal osseous mineralization. Joint spaces preserved. No fracture/dislocation/boney destruction.  Orthopedic hardware intact noted to the first metatarsal and second metatarsal  Assessment: 1.  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 Patient evaluated. X-Rays reviewed.  2.  Due to the shortening osteotomy of the second metatarsal the patient may have some transfer pressure over to the third metatarsal head. 3.  Metatarsal pads were dispensed today.  If this significantly improves her symptoms I do recommend orthotics with metatarsal pads 4.  Return to clinic in 4 weeks      Edrick Kins, DPM [facility]  Dr. Edrick Kins, DPM    [address], [place] [address]                Office [phone]  Fax [phone]

## 2019-01-14 NOTE — Telephone Encounter (Signed)
Pt needs Medicare wellness labs ordered for 01/18/19.  Thanks

## 2019-01-15 ENCOUNTER — Other Ambulatory Visit: Payer: Self-pay | Admitting: Family Medicine

## 2019-01-15 DIAGNOSIS — E559 Vitamin D deficiency, unspecified: Secondary | ICD-10-CM

## 2019-01-15 DIAGNOSIS — E538 Deficiency of other specified B group vitamins: Secondary | ICD-10-CM

## 2019-01-15 DIAGNOSIS — E038 Other specified hypothyroidism: Secondary | ICD-10-CM

## 2019-01-15 DIAGNOSIS — Z79899 Other long term (current) drug therapy: Secondary | ICD-10-CM

## 2019-01-15 DIAGNOSIS — E119 Type 2 diabetes mellitus without complications: Secondary | ICD-10-CM

## 2019-01-15 DIAGNOSIS — E782 Mixed hyperlipidemia: Secondary | ICD-10-CM

## 2019-01-15 NOTE — Telephone Encounter (Signed)
FLP, E78.5 hyperlipidemia A1c, E11.9 diabetes mellitus type 2 Urine microalbumin, E11.9 diabetes mellitus type 2 Cbc with diff, HFP, BMET: Z79.899 long term medication usage TSH: fatigue other Vit D: E55.9 low vitamin d levels B12: E53.8

## 2019-01-16 ENCOUNTER — Other Ambulatory Visit: Payer: Self-pay

## 2019-01-16 ENCOUNTER — Encounter: Payer: Medicare Other | Attending: Physical Medicine & Rehabilitation | Admitting: Registered Nurse

## 2019-01-16 VITALS — BP 133/75 | HR 68 | Temp 97.7°F | Ht 66.0 in | Wt 223.0 lb

## 2019-01-16 DIAGNOSIS — M961 Postlaminectomy syndrome, not elsewhere classified: Secondary | ICD-10-CM

## 2019-01-16 DIAGNOSIS — Z5181 Encounter for therapeutic drug level monitoring: Secondary | ICD-10-CM | POA: Diagnosis not present

## 2019-01-16 DIAGNOSIS — M542 Cervicalgia: Secondary | ICD-10-CM | POA: Diagnosis not present

## 2019-01-16 DIAGNOSIS — G8929 Other chronic pain: Secondary | ICD-10-CM

## 2019-01-16 DIAGNOSIS — M545 Low back pain, unspecified: Secondary | ICD-10-CM

## 2019-01-16 DIAGNOSIS — M25561 Pain in right knee: Secondary | ICD-10-CM | POA: Diagnosis not present

## 2019-01-16 DIAGNOSIS — Z79899 Other long term (current) drug therapy: Secondary | ICD-10-CM | POA: Insufficient documentation

## 2019-01-16 DIAGNOSIS — Z79891 Long term (current) use of opiate analgesic: Secondary | ICD-10-CM

## 2019-01-16 DIAGNOSIS — G894 Chronic pain syndrome: Secondary | ICD-10-CM | POA: Diagnosis not present

## 2019-01-16 DIAGNOSIS — M546 Pain in thoracic spine: Secondary | ICD-10-CM

## 2019-01-16 DIAGNOSIS — M25562 Pain in left knee: Secondary | ICD-10-CM

## 2019-01-16 MED ORDER — MORPHINE SULFATE ER 15 MG PO TBCR: 15.0000 mg | EXTENDED_RELEASE_TABLET | Freq: Three times a day (TID) | ORAL | 0 refills | Status: DC

## 2019-01-16 NOTE — Progress Notes (Signed)
Subjective:    Patient ID: [name], female    DOB: 1951-01-09, 68 y.o.   MRN: [id number]  HPI: [name] is a 68 y.o. female who returns for follow up appointment for chronic pain and medication refill. She states her  pain is located in her mid- lower back and bilateral knees. She rates her pain 5. Her current exercise regime is walking.  [name] Morphine equivalent is 45.00 MME.  Last Oral Swab was Performed on 11/20/2018. See note for details.   Pain Inventory Average Pain 6 Pain Right Now 5 My pain is constant, burning, dull and aching  In the last 24 hours, has pain interfered with the following? General activity 6 Relation with others 5 Enjoyment of life 5 What TIME of day is your pain at its worst? evening night Sleep (in general) Fair  Pain is worse with: walking, bending, standing and some activites Pain improves with: rest, heat/ice, pacing activities, medication, TENS and injections Relief from Meds: 5  Mobility walk without assistance how many minutes can you walk? 10-15 ability to climb steps?  yes do you drive?  yes  Function disabled: date disabled 12/2008 retired I need assistance with the following:  meal prep, household duties and shopping  Neuro/Psych bladder control problems numbness tremor depression  Prior Studies Any changes since last visit?  no  Physicians involved in your care Any changes since last visit?  no   Family History  Problem Relation Age of Onset  . Breast cancer Mother   . Diabetes Mother   . Heart disease Mother   . Allergies Mother   . Diabetes Father   . Heart disease Father   . Emphysema Father   . Allergies Father   . Stomach cancer Maternal Uncle   . Breast cancer Paternal Aunt   . Colon cancer Neg Hx    Social History   Socioeconomic History  . Marital status: Married    Spouse name: Not on file  . Number of children: Not on file  . Years of education: Not on file  . Highest education  level: Not on file  Occupational History  . Occupation: Medical laboratory scientific officer  . Financial resource strain: Not on file  . Food insecurity    Worry: Not on file    Inability: Not on file  . Transportation needs    Medical: Not on file    Non-medical: Not on file  Tobacco Use  . Smoking status: Never Smoker  . Smokeless tobacco: Never Used  Substance and Sexual Activity  . Alcohol use: Yes    Alcohol/week: 0.0 standard drinks    Comment: rare  . Drug use: No  . Sexual activity: Not on file  Lifestyle  . Physical activity    Days per week: Not on file    Minutes per session: Not on file  . Stress: Not on file  Relationships  . Social Herbalist on phone: Not on file    Gets together: Not on file    Attends religious service: Not on file    Active member of club or organization: Not on file    Attends meetings of clubs or organizations: Not on file    Relationship status: Not on file  Other Topics Concern  . Not on file  Social History Narrative  . Not on file   Past Surgical History:  Procedure Laterality Date  . [place] STUDY N/A 01/04/2016  Procedure: [place] STUDY;  Surgeon: Manus Gunning, MD;  Location: [facility];  Service: Gastroenterology;  Laterality: N/A;  . ABDOMINAL HYSTERECTOMY  1988  . ADENOIDECTOMY    . ANTERIOR CERVICAL DECOMP/DISCECTOMY FUSION  2009, 1993   first procedure in ~ 1993/Dr Botero  . APPENDECTOMY  1988  . BACK SURGERY    . CHOLECYSTECTOMY  2005  . ESOPHAGEAL MANOMETRY N/A 01/04/2016   Procedure: ESOPHAGEAL MANOMETRY (EM) 24 HR PH;  Surgeon: Manus Gunning, MD;  Location: [facility];  Service: Gastroenterology;  Laterality: N/A;  . ESOPHAGOGASTRODUODENOSCOPY (EGD) WITH PROPOFOL N/A 05/17/2016   Procedure: ESOPHAGOGASTRODUODENOSCOPY (EGD) WITH PROPOFOL;  Surgeon: Manus Gunning, MD;  Location: [facility];  Service: Gastroenterology;  Laterality: N/A;  . NASAL SINUS SURGERY  2006  . SAVORY  DILATION N/A 05/17/2016   Procedure: SAVORY DILATION;  Surgeon: Manus Gunning, MD;  Location: [facility];  Service: Gastroenterology;  Laterality: N/A;  . TONSILLECTOMY AND ADENOIDECTOMY    . TRANSTHORACIC ECHOCARDIOGRAM  2009   Normal LV size and function. EF 60-65%. No regional wall motion and amount is. Mild aortic sclerosis, no stenosis  . TUBAL LIGATION  1979   Past Medical History:  Diagnosis Date  . Allergy   . Anemia   . Arthritis   . B12 deficiency   . Complication of anesthesia    slow to wake up   . Depression   . Diabetes mellitus type 2, diet-controlled ([place]) 07/24/2018  . Diabetes mellitus without complication ([place])    type II - diet controlled ; A1C 6.5 01/2017  . Fibromyalgia   . Generalized hyperhidrosis 04/17/2014  . GERD (gastroesophageal reflux disease)   . Heart murmur    Aortic Sclerosis  . Hyperlipemia   . HYPOTHYROIDISM 05/29/2007  . Irritable bowel syndrome   . Migraines    hx of   . Oxygen deficiency    2 L at bedtime  . 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 PONV (postoperative nausea and vomiting)   . PVC (premature ventricular contraction)   . Type II or unspecified type diabetes mellitus with neurological manifestations, not stated as uncontrolled(250.60) 04/07/2013  . Unspecified essential hypertension    denies htn on 05/09/16   BP 133/75   Pulse 68   Temp 97.7 F (36.5 C)   Ht 5\' 6"  (1.676 m)   Wt 223 lb (101.2 kg)   SpO2 96%   BMI 35.99 kg/m   Opioid Risk Score:   Fall Risk Score:  `1  Depression screen PHQ 2/9  Depression screen [facility] 2/9 08/08/2018 04/24/2018 01/10/2018 11/24/2017 03/27/2017 02/28/2017 01/25/2017  Decreased Interest 0 0 2 1 0 1 1  Down, Depressed, Hopeless 0 0 0 1 0 1 1  PHQ - 2 Score 0 0 2 2 0 2 2  Altered sleeping - - 0 - - - -  Tired, decreased energy - - 1 - - - -  Change in appetite - - 0 - - - -  Feeling bad or failure about yourself  - - 0 - - - -  Trouble concentrating -  - 1 - - - -  Moving slowly or fidgety/restless - - 0 - - - -  Suicidal thoughts - - 0 - - - -  PHQ-9 Score - - 4 - - - -  Difficult doing work/chores - - Not difficult at all Not difficult at all - - -  Some recent data might be hidden     Review of Systems  Constitutional: Negative.   HENT: Negative.   Eyes: Negative.   Respiratory: Negative.   Cardiovascular: Negative.   Gastrointestinal: Negative.   Endocrine: Negative.  Negative for cold intolerance.  Genitourinary: Negative.   Musculoskeletal: Negative.   Skin: Negative.   Allergic/Immunologic: Negative.   Neurological: Negative.   Hematological: Negative.   Psychiatric/Behavioral: Negative.   All other systems reviewed and are negative.      Objective:   Physical Exam Vitals signs and nursing note reviewed.  Constitutional:      Appearance: Normal appearance.  Neck:     Musculoskeletal: Normal range of motion and neck supple.  Cardiovascular:     Rate and Rhythm: Normal rate and regular rhythm.     Pulses: Normal pulses.     Heart sounds: Normal heart sounds.  Pulmonary:     Effort: Pulmonary effort is normal.     Breath sounds: Normal breath sounds.  Musculoskeletal:     Comments: Normal Muscle Bulk and Muscle Testing Reveals:  Upper Extremities: Full ROM and Muscle Strength 5/5 Thoracic Paraspinal Tenderness: T-7-T-9  Lumbar Paraspinal Tenderness: L-3-L-5 Lower Extremities: Full ROM and Muscle Strength 5/5 Arises from Table with Ease Narrow Based  Gait   Skin:    General: Skin is warm and dry.  Neurological:     Mental Status: She is alert and oriented to person, place, and time.  Psychiatric:        Mood and Affect: Mood normal.        Behavior: Behavior normal.           Assessment & Plan:  1. Cervical postlaminectomy syndrome:/ Cervicalgia/ Cervical Radiculitis/with neck and shoulder pain/ Chronic Midline Thoracic Pain.01/16/2019.Continue exercise routine and using heat  therapy.Refilled:current medication regimen withMS Contin 15 mg one tablet three times a day #90.  We will continue the opioid monitoring program, this consists of regular clinic visits, examinations, urine drug screen, pill counts as well as use of [place] Controlled Substance reporting System. 2. Fibromyalgia/ Neuropathy: Continuecurrent medication regimen withGabapentin, activity and exercise regime.01/16/2019. 3. Bilateral Knee Pain/ Mild Degenerative Changes:.Continuecurrent medication regimen withVoltaren Gel.01/16/2019. 4.BilateralAnkle Pain: No complaints today. Continue HEP as tolerated. Continueto monitor.01/16/2019. 5. Muscle Spasm: Continuecurrent medication regimen withRobaxin. 01/16/2019. 6. Opioid Induced Constipation: No complaints Continue to Monitor.01/16/2019. 7. Midline Low Back Pain: Continue current medication regime, continue HEP as tolerated.01/16/2019. 8. Right Gluteal Medius Weakness and Atrophy: No complaints Today:Continue with Hip Abduction Exercisesas tolerated: Continue to Monitor.01/16/2019 9. Right Greater Trochanter Bursitis:No Complaints Today.Continue to Alternate Ice and Heat Therapy. Continue to Monitor. 01/16/2019 10. Sacroiliac Joint Pain:No complaints today. S/PSacroiliac Injection on 08/24/2018 with good relief noted. 01/16/2019 11. Chronic  low back pain without sciatica: Continue HEP as tolerated. Continue current medication regimen. Continue to monitor. 01/16/2019.  F/U in 1 month

## 2019-01-18 ENCOUNTER — Other Ambulatory Visit (INDEPENDENT_AMBULATORY_CARE_PROVIDER_SITE_OTHER): Payer: Medicare Other

## 2019-01-18 ENCOUNTER — Other Ambulatory Visit: Payer: Medicare Other

## 2019-01-18 ENCOUNTER — Ambulatory Visit (INDEPENDENT_AMBULATORY_CARE_PROVIDER_SITE_OTHER): Payer: Medicare Other

## 2019-01-18 VITALS — BP 149/71 | HR 70 | Temp 97.6°F | Ht 66.0 in | Wt 226.0 lb

## 2019-01-18 DIAGNOSIS — Z79899 Other long term (current) drug therapy: Secondary | ICD-10-CM

## 2019-01-18 DIAGNOSIS — E559 Vitamin D deficiency, unspecified: Secondary | ICD-10-CM

## 2019-01-18 DIAGNOSIS — E119 Type 2 diabetes mellitus without complications: Secondary | ICD-10-CM | POA: Diagnosis not present

## 2019-01-18 DIAGNOSIS — E538 Deficiency of other specified B group vitamins: Secondary | ICD-10-CM | POA: Diagnosis not present

## 2019-01-18 DIAGNOSIS — E782 Mixed hyperlipidemia: Secondary | ICD-10-CM | POA: Diagnosis not present

## 2019-01-18 DIAGNOSIS — E038 Other specified hypothyroidism: Secondary | ICD-10-CM | POA: Diagnosis not present

## 2019-01-18 DIAGNOSIS — Z0000 Encounter for general adult medical examination without abnormal findings: Secondary | ICD-10-CM

## 2019-01-18 LAB — CBC WITH DIFFERENTIAL/PLATELET
Basophils Absolute: 0 10*3/uL (ref 0.0–0.1)
Basophils Relative: 0.4 % (ref 0.0–3.0)
Eosinophils Absolute: 0.1 10*3/uL (ref 0.0–0.7)
Eosinophils Relative: 2.1 % (ref 0.0–5.0)
HCT: 36.7 % (ref 36.0–46.0)
Hemoglobin: 12.4 g/dL (ref 12.0–15.0)
Lymphocytes Relative: 31.4 % (ref 12.0–46.0)
Lymphs Abs: 1.8 10*3/uL (ref 0.7–4.0)
MCHC: 33.8 g/dL (ref 30.0–36.0)
MCV: 89.6 fl (ref 78.0–100.0)
Monocytes Absolute: 0.4 10*3/uL (ref 0.1–1.0)
Monocytes Relative: 6.1 % (ref 3.0–12.0)
Neutro Abs: 3.5 10*3/uL (ref 1.4–7.7)
Neutrophils Relative %: 60 % (ref 43.0–77.0)
Platelets: 252 10*3/uL (ref 150.0–400.0)
RBC: 4.1 Mil/uL (ref 3.87–5.11)
RDW: 12.3 % (ref 11.5–15.5)
WBC: 5.8 10*3/uL (ref 4.0–10.5)

## 2019-01-18 LAB — BASIC METABOLIC PANEL
BUN: 14 mg/dL (ref 6–23)
CO2: 34 mEq/L — ABNORMAL HIGH (ref 19–32)
Calcium: 9.6 mg/dL (ref 8.4–10.5)
Chloride: 98 mEq/L (ref 96–112)
Creatinine, Ser: 0.69 mg/dL (ref 0.40–1.20)
GFR: 84.81 mL/min (ref >60.00)
Glucose, Bld: 115 mg/dL — ABNORMAL HIGH (ref 70–99)
Potassium: 4.3 mEq/L (ref 3.5–5.1)
Sodium: 139 mEq/L (ref 135–145)

## 2019-01-18 LAB — VITAMIN D 25 HYDROXY (VIT D DEFICIENCY, FRACTURES): VITD: 36.09 ng/mL (ref 30.00–100.00)

## 2019-01-18 LAB — LIPID PANEL
Cholesterol: 186 mg/dL (ref 0–200)
HDL: 43.1 mg/dL (ref >39.00)
NonHDL: 143.23
Total CHOL/HDL Ratio: 4
Triglycerides: 209 mg/dL — ABNORMAL HIGH (ref 0.0–149.0)
VLDL: 41.8 mg/dL — ABNORMAL HIGH (ref 0.0–40.0)

## 2019-01-18 LAB — HEPATIC FUNCTION PANEL
ALT: 41 U/L — ABNORMAL HIGH (ref 0–35)
AST: 46 U/L — ABNORMAL HIGH (ref 0–37)
Albumin: 4.4 g/dL (ref 3.5–5.2)
Alkaline Phosphatase: 112 U/L (ref 39–117)
Bilirubin, Direct: 0.1 mg/dL (ref 0.0–0.3)
Total Bilirubin: 0.4 mg/dL (ref 0.2–1.2)
Total Protein: 6.9 g/dL (ref 6.0–8.3)

## 2019-01-18 LAB — MICROALBUMIN / CREATININE URINE RATIO
Creatinine,U: 38.2 mg/dL
Microalb Creat Ratio: 1.8 mg/g (ref 0.0–30.0)
Microalb, Ur: <0.7 mg/dL (ref 0.0–1.9)

## 2019-01-18 LAB — VITAMIN B12: Vitamin B-12: 545 pg/mL (ref 211–911)

## 2019-01-18 LAB — LDL CHOLESTEROL, DIRECT: Direct LDL: 125 mg/dL

## 2019-01-18 LAB — TSH: TSH: 2.63 u[IU]/mL (ref 0.35–4.50)

## 2019-01-18 LAB — HEMOGLOBIN A1C: Hgb A1c MFr Bld: 7.1 % — ABNORMAL HIGH (ref 4.6–6.5)

## 2019-01-18 NOTE — Patient Instructions (Signed)
[name] , Thank you for taking time to come for your Medicare Wellness Visit. I appreciate your ongoing commitment to your health goals. Please review the following plan we discussed and let me know if I can assist you in the future.   Screening recommendations/referrals: Colonoscopy: cologuard 07/2018 Mammogram: 06/2018 Bone Density: 02/2018 Recommended yearly ophthalmology/optometry visit for glaucoma screening and checkup Recommended yearly dental visit for hygiene and checkup  Vaccinations: Influenza vaccine: declines Pneumococcal vaccine: 06/2018 Tdap vaccine: 09/2013 Shingles vaccine: discussed    Advanced directives: Please bring a copy of your POA (Power of [place]) and/or Living Will to your next appointment.    Conditions/risks identified: obesity  Next appointment: 01/24/2019 at 2:20   Preventive Care 67 Years and Older, Female Preventive care refers to lifestyle choices and visits with your health care provider that can promote health and wellness. What does preventive care include?  A yearly physical exam. This is also called an annual well check.  Dental exams once or twice a year.  Routine eye exams. Ask your health care provider how often you should have your eyes checked.  Personal lifestyle choices, including:  Daily care of your teeth and gums.  Regular physical activity.  Eating a healthy diet.  Avoiding tobacco and drug use.  Limiting alcohol use.  Practicing safe sex.  Taking low-dose aspirin every day.  Taking vitamin and mineral supplements as recommended by your health care provider. What happens during an annual well check? The services and screenings done by your health care provider during your annual well check will depend on your age, overall health, lifestyle risk factors, and family history of disease. Counseling  Your health care provider may ask you questions about your:  Alcohol use.  Tobacco use.  Drug use.  Emotional  well-being.  Home and relationship well-being.  Sexual activity.  Eating habits.  History of falls.  Memory and ability to understand (cognition).  Work and work Statistician.  Reproductive health. Screening  You may have the following tests or measurements:  Height, weight, and BMI.  Blood pressure.  Lipid and cholesterol levels. These may be checked every 5 years, or more frequently if you are over 56 years old.  Skin check.  Lung cancer screening. You may have this screening every year starting at age 49 if you have a 30-pack-year history of smoking and currently smoke or have quit within the past 15 years.  Fecal occult blood test (FOBT) of the stool. You may have this test every year starting at age 38.  Flexible sigmoidoscopy or colonoscopy. You may have a sigmoidoscopy every 5 years or a colonoscopy every 10 years starting at age 72.  Hepatitis C blood test.  Hepatitis B blood test.  Sexually transmitted disease (STD) testing.  Diabetes screening. This is done by checking your blood sugar (glucose) after you have not eaten for a while (fasting). You may have this done every 1-3 years.  Bone density scan. This is done to screen for osteoporosis. You may have this done starting at age 1.  Mammogram. This may be done every 1-2 years. Talk to your health care provider about how often you should have regular mammograms. Talk with your health care provider about your test results, treatment options, and if necessary, the need for more tests. Vaccines  Your health care provider may recommend certain vaccines, such as:  Influenza vaccine. This is recommended every year.  Tetanus, diphtheria, and acellular pertussis (Tdap, Td) vaccine. You may need a Td  booster every 10 years.  Zoster vaccine. You may need this after age 53.  Pneumococcal 13-valent conjugate (PCV13) vaccine. One dose is recommended after age 27.  Pneumococcal polysaccharide (PPSV23) vaccine. One  dose is recommended after age 21. Talk to your health care provider about which screenings and vaccines you need and how often you need them. This information is not intended to replace advice given to you by your health care provider. Make sure you discuss any questions you have with your health care provider. Document Released: 07/10/2015 Document Revised: 03/02/2016 Document Reviewed: 04/14/2015 Elsevier Interactive Patient Education  2017 [place] Prevention in the Home Falls can cause injuries. They can happen to people of all ages. There are many things you can do to make your home safe and to help prevent falls. What can I do on the outside of my home?  Regularly fix the edges of walkways and driveways and fix any cracks.  Remove anything that might make you trip as you walk through a door, such as a raised step or threshold.  Trim any bushes or trees on the path to your home.  Use bright outdoor lighting.  Clear any walking paths of anything that might make someone trip, such as rocks or tools.  Regularly check to see if handrails are loose or broken. Make sure that both sides of any steps have handrails.  Any raised decks and porches should have guardrails on the edges.  Have any leaves, snow, or ice cleared regularly.  Use sand or salt on walking paths during winter.  Clean up any spills in your garage right away. This includes oil or grease spills. What can I do in the bathroom?  Use night lights.  Install grab bars by the toilet and in the tub and shower. Do not use towel bars as grab bars.  Use non-skid mats or decals in the tub or shower.  If you need to sit down in the shower, use a plastic, non-slip stool.  Keep the floor dry. Clean up any water that spills on the floor as soon as it happens.  Remove soap buildup in the tub or shower regularly.  Attach bath mats securely with double-sided non-slip rug tape.  Do not have throw rugs and other  things on the floor that can make you trip. What can I do in the bedroom?  Use night lights.  Make sure that you have a light by your bed that is easy to reach.  Do not use any sheets or blankets that are too big for your bed. They should not hang down onto the floor.  Have a firm chair that has side arms. You can use this for support while you get dressed.  Do not have throw rugs and other things on the floor that can make you trip. What can I do in the kitchen?  Clean up any spills right away.  Avoid walking on wet floors.  Keep items that you use a lot in easy-to-reach places.  If you need to reach something above you, use a strong step stool that has a grab bar.  Keep electrical cords out of the way.  Do not use floor polish or wax that makes floors slippery. If you must use wax, use non-skid floor wax.  Do not have throw rugs and other things on the floor that can make you trip. What can I do with my stairs?  Do not leave any items on the stairs.  Make sure that there are handrails on both sides of the stairs and use them. Fix handrails that are broken or loose. Make sure that handrails are as long as the stairways.  Check any carpeting to make sure that it is firmly attached to the stairs. Fix any carpet that is loose or worn.  Avoid having throw rugs at the top or bottom of the stairs. If you do have throw rugs, attach them to the floor with carpet tape.  Make sure that you have a light switch at the top of the stairs and the bottom of the stairs. If you do not have them, ask someone to add them for you. What else can I do to help prevent falls?  Wear shoes that:  Do not have high heels.  Have rubber bottoms.  Are comfortable and fit you well.  Are closed at the toe. Do not wear sandals.  If you use a stepladder:  Make sure that it is fully opened. Do not climb a closed stepladder.  Make sure that both sides of the stepladder are locked into place.  Ask  someone to hold it for you, if possible.  Clearly mark and make sure that you can see:  Any grab bars or handrails.  First and last steps.  Where the edge of each step is.  Use tools that help you move around (mobility aids) if they are needed. These include:  Canes.  Walkers.  Scooters.  Crutches.  Turn on the lights when you go into a dark area. Replace any light bulbs as soon as they burn out.  Set up your furniture so you have a clear path. Avoid moving your furniture around.  If any of your floors are uneven, fix them.  If there are any pets around you, be aware of where they are.  Review your medicines with your doctor. Some medicines can make you feel dizzy. This can increase your chance of falling. Ask your doctor what other things that you can do to help prevent falls. This information is not intended to replace advice given to you by your health care provider. Make sure you discuss any questions you have with your health care provider. Document Released: 04/09/2009 Document Revised: 11/19/2015 Document Reviewed: 07/18/2014 Elsevier Interactive Patient Education  2017 [employer].

## 2019-01-18 NOTE — Progress Notes (Signed)
PCP notes:  Health Maintenance:  Eye exam (appointment in December), foot exam  Abnormal Screenings:  None  Patient concerns:  None  Nurse concerns:  None  Next PCP appt.: 01/24/2019 at 2:20

## 2019-01-18 NOTE — Progress Notes (Signed)
Subjective:   [name] is a 68 y.o. female who presents for Medicare Annual (Subsequent) preventive examination.  This visit type was conducted due to national recommendations for restrictions regarding the COVID-19 Pandemic (e.g. social distancing). This format is felt to be most appropriate for this patient at this time. All issues noted in this document were discussed and addressed. No physical exam was performed (except for noted visual exam findings with Video Visits). This patient, [name], has given permission to perform this visit via telephone. Vital signs may be absent or patient reported.  Patient location:  At home  Nurse location:  At home     Review of Systems:  n/a Cardiac Risk Factors include: advanced age (>49men, >34 women);diabetes mellitus;dyslipidemia;hypertension;obesity (BMI >30kg/m2);sedentary lifestyle     Objective:     Vitals: BP (!) 149/71 Comment: per patient  Pulse 70 Comment: per patient  Temp 97.6 F (36.4 C) Comment: per patient  Ht 5\' 6"  (1.676 m) Comment: per patient  Wt 226 lb (102.5 kg) Comment: per patient  SpO2 96% Comment: per patient  BMI 36.48 kg/m   Body mass index is 36.48 kg/m.  Advanced Directives 01/18/2019 01/10/2018 04/26/2017 03/27/2017 03/01/2017 02/28/2017 01/25/2017  Does Patient Have a Medical Advance Directive? Yes Yes Yes Yes Yes Yes Yes  Type of Paramedic of [place];Living will [place];Living will [place];Living will - Living will;Healthcare Power of [place];Living will [place];Living will  Does patient want to make changes to medical advance directive? No - Patient declined - - - - - -  Copy of [place] in Chart? No - copy requested Yes No - copy requested - - No - copy requested No - copy requested    Tobacco Social History   Tobacco Use  Smoking Status Never Smoker   Smokeless Tobacco Never Used     Counseling given: Not Answered   Clinical Intake:  Pre-visit preparation completed: Yes  Pain : 0-10 Pain Score: 4  Pain Type: Chronic pain Pain Location: Back Pain Orientation: Mid Pain Radiating Towards: none Pain Descriptors / Indicators: Aching Pain Onset: More than a month ago Pain Frequency: Constant Pain Relieving Factors: medication and ice  Pain Relieving Factors: medication and ice  Nutritional Status: BMI > 30  Obese Nutritional Risks: None Diabetes: Yes CBG done?: No Did pt. bring in CBG monitor from home?: No  How often do you need to have someone help you when you read instructions, pamphlets, or other written materials from your doctor or pharmacy?: 1 - Never What is the last grade level you completed in school?: some college  Interpreter Needed?: No  Information entered by :: NAllen LPN  Past Medical History:  Diagnosis Date  . Allergy   . Anemia   . Arthritis   . B12 deficiency   . Complication of anesthesia    slow to wake up   . Depression   . Diabetes mellitus type 2, diet-controlled ([place]) 07/24/2018  . Diabetes mellitus without complication ([place])    type II - diet controlled ; A1C 6.5 01/2017  . Fibromyalgia   . Generalized hyperhidrosis 04/17/2014  . GERD (gastroesophageal reflux disease)   . Heart murmur    Aortic Sclerosis  . Hyperlipemia   . HYPOTHYROIDISM 05/29/2007  . Irritable bowel syndrome   . Migraines    hx of   . Oxygen deficiency    2 L  at bedtime  . 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 PONV (postoperative nausea and vomiting)   . PVC (premature ventricular contraction)   . Type II or unspecified type diabetes mellitus with neurological manifestations, not stated as uncontrolled(250.60) 04/07/2013  . Unspecified essential hypertension    denies htn on 05/09/16   Past Surgical History:  Procedure Laterality Date  . [place] STUDY N/A 01/04/2016    Procedure: [place] STUDY;  Surgeon: Manus Gunning, MD;  Location: [facility];  Service: Gastroenterology;  Laterality: N/A;  . ABDOMINAL HYSTERECTOMY  1988  . ADENOIDECTOMY    . ANTERIOR CERVICAL DECOMP/DISCECTOMY FUSION  2009, 1993   first procedure in ~ 1993/Dr Botero  . APPENDECTOMY  1988  . BACK SURGERY    . CHOLECYSTECTOMY  2005  . ESOPHAGEAL MANOMETRY N/A 01/04/2016   Procedure: ESOPHAGEAL MANOMETRY (EM) 24 HR PH;  Surgeon: Manus Gunning, MD;  Location: [facility];  Service: Gastroenterology;  Laterality: N/A;  . ESOPHAGOGASTRODUODENOSCOPY (EGD) WITH PROPOFOL N/A 05/17/2016   Procedure: ESOPHAGOGASTRODUODENOSCOPY (EGD) WITH PROPOFOL;  Surgeon: Manus Gunning, MD;  Location: [facility];  Service: Gastroenterology;  Laterality: N/A;  . NASAL SINUS SURGERY  2006  . SAVORY DILATION N/A 05/17/2016   Procedure: SAVORY DILATION;  Surgeon: Manus Gunning, MD;  Location: [facility];  Service: Gastroenterology;  Laterality: N/A;  . TONSILLECTOMY AND ADENOIDECTOMY    . TRANSTHORACIC ECHOCARDIOGRAM  2009   Normal LV size and function. EF 60-65%. No regional wall motion and amount is. Mild aortic sclerosis, no stenosis  . TUBAL LIGATION  1979   Family History  Problem Relation Age of Onset  . Breast cancer Mother   . Diabetes Mother   . Heart disease Mother   . Allergies Mother   . Diabetes Father   . Heart disease Father   . Emphysema Father   . Allergies Father   . Stomach cancer Maternal Uncle   . Breast cancer Paternal Aunt   . Colon cancer Neg Hx    Social History   Socioeconomic History  . Marital status: Married    Spouse name: Not on file  . Number of children: Not on file  . Years of education: Not on file  . Highest education level: Not on file  Occupational History  . Occupation: Radiation protection practitioner  . Occupation: retired  Scientific laboratory technician  . Financial resource strain: Not hard at all  . Food insecurity    Worry: Never true     Inability: Never true  . Transportation needs    Medical: No    Non-medical: No  Tobacco Use  . Smoking status: Never Smoker  . Smokeless tobacco: Never Used  Substance and Sexual Activity  . Alcohol use: Not Currently    Alcohol/week: 0.0 standard drinks    Comment: rare  . Drug use: Yes    Types: Morphine  . Sexual activity: Not Currently  Lifestyle  . Physical activity    Days per week: 0 days    Minutes per session: 0 min  . Stress: Not at all  Relationships  . Social Herbalist on phone: Not on file    Gets together: Not on file    Attends religious service: Not on file    Active member of club or organization: Not on file    Attends meetings of clubs or organizations: Not on file    Relationship status:  Not on file  Other Topics Concern  . Not on file  Social History Narrative  . Not on file    Outpatient Encounter Medications as of 01/18/2019  Medication Sig  . B Complex Vitamins (B COMPLEX 100 PO) Take 100 mg by mouth daily.   . bisacodyl (DULCOLAX) 5 MG EC tablet Take 5 mg by mouth daily as needed for moderate constipation. Take 1-2 at hs  . Calcium Carbonate (CALCIUM 600 PO) Take 600 mg by mouth 2 (two) times daily.   . carboxymethylcellulose (REFRESH PLUS) 0.5 % SOLN Place 1 drop into both eyes 3 (three) times daily as needed.   . cetirizine (ZYRTEC) 10 MG tablet Take 10 mg by mouth daily.  . Cholecalciferol (VITAMIN D) 2000 UNITS tablet Take 4,000 Units by mouth daily.   . citalopram (CELEXA) 40 MG tablet TAKE 1 TABLET BY MOUTH  DAILY  . Coenzyme Q-10 100 MG capsule Take 100 mg by mouth daily.  . diclofenac sodium (VOLTAREN) 1 % GEL Apply 4 g topically 4 (four) times daily as needed.  . docusate sodium (COLACE) 100 MG capsule Take 400 mg by mouth at bedtime.   . gabapentin (NEURONTIN) 300 MG capsule Take 1 capsule (300 mg total) by mouth 3 (three) times daily.  Marland Kitchen GRAPE SEED EXTRACT PO Take by mouth. 1 tablet daily  . levothyroxine (SYNTHROID,  LEVOTHROID) 50 MCG tablet TAKE 1 TABLET BY MOUTH  DAILY  . MAGNESIUM GLYCINATE PLUS PO Take 400 mg by mouth at bedtime.  . methocarbamol (ROBAXIN) 500 MG tablet TAKE ONE (1) TABLET BY MOUTH EVERY EIGHTHOURS AS NEEDED FOR MUSCLE SPASMS  . mirtazapine (REMERON) 15 MG tablet TAKE 1 TABLET BY MOUTH AT  BEDTIME  . morphine (MS CONTIN) 15 MG 12 hr tablet Take 1 tablet (15 mg total) by mouth 3 (three) times daily.  . nitroGLYCERIN (NITROSTAT) 0.4 MG SL tablet Place 1 tablet (0.4 mg total) under the tongue every 5 (five) minutes as needed for chest pain.  . NON FORMULARY Daith piercing in right ear for headaches  . oxybutynin (DITROPAN-XL) 10 MG 24 hr tablet Take 1 tablet (10 mg total) by mouth at bedtime.  . RABEprazole (ACIPHEX) 20 MG tablet Take 20 mg by mouth daily.  . TURMERIC PO Take 800 mg by mouth daily.    No facility-administered encounter medications on file as of 01/18/2019.     Activities of Daily Living In your present state of health, do you have any difficulty performing the following activities: 01/18/2019  Hearing? N  Vision? N  Difficulty concentrating or making decisions? N  Walking or climbing stairs? Y  Comment has to hold on to railing  Dressing or bathing? N  Doing errands, shopping? N  Preparing Food and eating ? N  Using the Toilet? N  In the past six months, have you accidently leaked urine? Y  Do you have problems with loss of bowel control? N  Managing your Medications? N  Managing your Finances? N  Housekeeping or managing your Housekeeping? N  Some recent data might be hidden    Patient Care Team: Owens Loffler, MD as PCP - General [name], MD as PCP - Cardiology (Cardiology) Letta Pate, Luanna Salk, MD as Consulting Physician (Physical Medicine and Rehabilitation) Armbruster, Carlota Raspberry, MD as Consulting Physician (Gastroenterology) [name], MD as Consulting Physician (Cardiology) Chesley Mires, MD as Consulting Physician (Pulmonary Disease)     Assessment:   This is a routine wellness examination for [place].  Exercise Activities and Dietary recommendations Current Exercise Habits: The patient does not participate in regular exercise at present  Goals    . Increase physical activity     Starting 01/02/2017, I will increase my activity as tolerated and continue doing resistance band exercises daily.    . Patient Stated     Starting 01/10/18, I will continue to take medications as prescribed.     . Weight (lb) < 200 lb (90.7 kg)     01/18/2019, would like to lose 10-15       Fall Risk Fall Risk  01/18/2019 01/16/2019 11/13/2018 10/19/2018 09/21/2018  Falls in the past year? 0 0 0 0 0  Comment - - - - -  Number falls in past yr: 0 - - - -  Injury with Fall? - - - - -  Risk for fall due to : Medication side effect - - - -  Follow up Falls evaluation completed;Falls prevention discussed - - - -   Is the patient's home free of loose throw rugs in walkways, pet beds, electrical cords, etc?   yes      Grab bars in the bathroom? yes      Handrails on the stairs? n/a      Adequate lighting?   yes  Timed Get Up and Go performed: n/a  Depression Screen PHQ 2/9 Scores 01/18/2019 08/08/2018 04/24/2018 01/10/2018  PHQ - 2 Score 0 0 0 2  PHQ- 9 Score 3 - - 4     Cognitive Function MMSE - Mini Mental State Exam 01/18/2019 01/10/2018 01/02/2017  Orientation to time 5 5 5   Orientation to Place 5 5 5   Registration 3 3 3   Attention/ Calculation 5 0 0  Recall 3 3 3   Language- name 2 objects 0 0 0  Language- repeat 1 1 1   Language- follow 3 step command 0 3 3  Language- read & follow direction 0 0 0  Write a sentence 0 0 0  Copy design 0 0 0  Total score 22 20 20    Mini Cog  Mini-Cog screen was completed. Maximum score is 22. A value of 0 denotes this part of the MMSE was not completed or the patient failed this part of the Mini-Cog screening.       Immunization History  Administered Date(s) Administered  . Pneumococcal  Conjugate-13 01/02/2017  . Pneumococcal Polysaccharide-23 05/06/2013, 07/25/2018  . Td 06/28/2003, 10/16/2013  . Zoster 11/07/2012    Qualifies for Shingles Vaccine? yes  Screening Tests Health Maintenance  Topic Date Due  . OPHTHALMOLOGY EXAM  08/09/2018  . FOOT EXAM  11/30/2018  . URINE MICROALBUMIN  01/11/2019  . HEMOGLOBIN A1C  01/23/2019  . INFLUENZA VACCINE  01/26/2019  . MAMMOGRAM  07/06/2020  . Fecal DNA (Cologuard)  08/05/2021  . TETANUS/TDAP  10/17/2023  . DEXA SCAN  Completed  . Hepatitis C Screening  Completed  . PNA vac Low Risk Adult  Completed    Cancer Screenings: Lung: Low Dose CT Chest recommended if Age 39-80 years, 30 pack-year currently smoking OR have quit w/in 15years. Patient does not qualify. Breast:  Up to date on Mammogram? Yes   Up to date of Bone Density/Dexa? Yes Colorectal: cologuard 07/2018  Additional Screenings: : Hepatitis C Screening: 12/2008     Plan:    Patient wants to lose 10-15 pounds.   I have personally reviewed and noted the following in the patient's chart:   . Medical and social history . Use  of alcohol, tobacco or illicit drugs  . Current medications and supplements . Functional ability and status . Nutritional status . Physical activity . Advanced directives . List of other physicians . Hospitalizations, surgeries, and ER visits in previous 12 months . Vitals . Screenings to include cognitive, depression, and falls . Referrals and appointments  In addition, I have reviewed and discussed with patient certain preventive protocols, quality metrics, and best practice recommendations. A written personalized care plan for preventive services as well as general preventive health recommendations were provided to patient.     Kellie Simmering, LPN  [id number]

## 2019-01-22 DIAGNOSIS — J9610 Chronic respiratory failure, unspecified whether with hypoxia or hypercapnia: Secondary | ICD-10-CM | POA: Diagnosis not present

## 2019-01-24 ENCOUNTER — Other Ambulatory Visit: Payer: Self-pay

## 2019-01-24 ENCOUNTER — Encounter: Payer: Self-pay | Admitting: Family Medicine

## 2019-01-24 ENCOUNTER — Ambulatory Visit (INDEPENDENT_AMBULATORY_CARE_PROVIDER_SITE_OTHER): Payer: Medicare Other | Admitting: Family Medicine

## 2019-01-24 VITALS — BP 140/64 | HR 66 | Temp 98.2°F | Ht 65.0 in | Wt 223.5 lb

## 2019-01-24 DIAGNOSIS — Z0000 Encounter for general adult medical examination without abnormal findings: Secondary | ICD-10-CM

## 2019-01-24 NOTE — Progress Notes (Signed)
Alicia Padula T. Hamid Brookens, MD Primary Care and [place] at [facility] [place], [address] Phone: [phone]  FAX: [phone]  LATEYA DAURIA - 68 y.o. female  MRN [id number]  Date of Birth: [birth year]  Visit Date: 01/24/2019  PCP: Owens Loffler, MD  Referred by: Owens Loffler, MD  Chief Complaint  Patient presents with  . Annual Exam    Part 2   Patient Care Team: Owens Loffler, MD as PCP - General [name], MD as PCP - Cardiology (Cardiology) Letta Pate Luanna Salk, MD as Consulting Physician (Physical Medicine and Rehabilitation) Armbruster, Carlota Raspberry, MD as Consulting Physician (Gastroenterology) [name], MD as Consulting Physician (Cardiology) Chesley Mires, MD as Consulting Physician (Pulmonary Disease) Subjective:   [name] is a 68 y.o. pleasant patient who presents with the following:  Health Maintenance Summary Reviewed and updated, unless pt declines services.  Tobacco History Reviewed. Non-smoker Alcohol: No concerns, no excessive use Exercise Habits: pain limitations Some activity, rec at least 30 mins 5 times a week STD concerns: none Drug Use: None Birth control method: hyst Menses regular: hyst Lumps or breast concerns: no Breast Cancer Family History: no  Shingrix Mammo UTD  Making masks and given away 500  Health Maintenance  Topic Date Due  . OPHTHALMOLOGY EXAM  08/09/2018  . FOOT EXAM  11/30/2018  . INFLUENZA VACCINE  01/26/2019  . HEMOGLOBIN A1C  07/21/2019  . URINE MICROALBUMIN  01/18/2020  . MAMMOGRAM  07/06/2020  . Fecal DNA (Cologuard)  08/05/2021  . TETANUS/TDAP  10/17/2023  . DEXA SCAN  Completed  . Hepatitis C Screening  Completed  . PNA vac Low Risk Adult  Completed    Immunization History  Administered Date(s) Administered  . Pneumococcal Conjugate-13 01/02/2017  . Pneumococcal Polysaccharide-23 05/06/2013, 07/25/2018  . Td 06/28/2003,  10/16/2013  . Zoster 11/07/2012   Patient Active Problem List   Diagnosis Date Noted  . Postlaminectomy syndrome, cervical region 08/29/2011    Priority: High  . FIBROMYALGIA 01/11/2007    Priority: High  . Depression, major, recurrent, in partial remission ([place]) 09/22/2008    Priority: Medium  . Hypothyroidism 05/29/2007    Priority: Medium  . HYPERLIPIDEMIA 05/29/2007    Priority: Medium  . Essential hypertension 11/13/2006    Priority: Medium  . Diabetes mellitus type 2, diet-controlled ([place]) 07/24/2018  . Status post bunionectomy 04/24/2018  . Postoperative wound infection 04/24/2018  . Lipoma of buttock 01/10/2017  . Chronic respiratory failure with hypoxia ([place]) 04/07/2015  . Obesity hypoventilation syndrome ([place]) 03/26/2015  . Generalized hyperhidrosis 04/17/2014  . Obesity (BMI 30-39.9) 08/23/2013  . 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 Dysphagia 07/14/2010  . TRANSAMINASES, SERUM, ELEVATED 12/24/2008  . ALLERGIC RHINITIS 09/22/2008  . Vitamin D deficiency 01/11/2007  . Vitamin B 12 deficiency 11/13/2006  . GERD 11/13/2006   Past Medical History:  Diagnosis Date  . Allergy   . Anemia   . Arthritis   . B12 deficiency   . Complication of anesthesia    slow to wake up   . Depression   . Diabetes mellitus type 2, diet-controlled ([place]) 07/24/2018  . Diabetes mellitus without complication ([place])    type II - diet controlled ; A1C 6.5 01/2017  . Fibromyalgia   . Generalized hyperhidrosis 04/17/2014  . GERD (gastroesophageal reflux disease)   . Heart murmur    Aortic Sclerosis  . Hyperlipemia   . HYPOTHYROIDISM  05/29/2007  . Irritable bowel syndrome   . Migraines    hx of   . Oxygen deficiency    2 L at bedtime  . 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 PONV (postoperative nausea and vomiting)   . PVC (premature ventricular contraction)   . Type II or unspecified type diabetes mellitus with  neurological manifestations, not stated as uncontrolled(250.60) 04/07/2013  . Unspecified essential hypertension    denies htn on 05/09/16   Past Surgical History:  Procedure Laterality Date  . [place] STUDY N/A 01/04/2016   Procedure: [place] STUDY;  Surgeon: Manus Gunning, MD;  Location: [facility];  Service: Gastroenterology;  Laterality: N/A;  . ABDOMINAL HYSTERECTOMY  1988  . ADENOIDECTOMY    . ANTERIOR CERVICAL DECOMP/DISCECTOMY FUSION  2009, 1993   first procedure in ~ 1993/Dr Botero  . APPENDECTOMY  1988  . BACK SURGERY    . CHOLECYSTECTOMY  2005  . ESOPHAGEAL MANOMETRY N/A 01/04/2016   Procedure: ESOPHAGEAL MANOMETRY (EM) 24 HR PH;  Surgeon: Manus Gunning, MD;  Location: [facility];  Service: Gastroenterology;  Laterality: N/A;  . ESOPHAGOGASTRODUODENOSCOPY (EGD) WITH PROPOFOL N/A 05/17/2016   Procedure: ESOPHAGOGASTRODUODENOSCOPY (EGD) WITH PROPOFOL;  Surgeon: Manus Gunning, MD;  Location: [facility];  Service: Gastroenterology;  Laterality: N/A;  . NASAL SINUS SURGERY  2006  . SAVORY DILATION N/A 05/17/2016   Procedure: SAVORY DILATION;  Surgeon: Manus Gunning, MD;  Location: [facility];  Service: Gastroenterology;  Laterality: N/A;  . TONSILLECTOMY AND ADENOIDECTOMY    . TRANSTHORACIC ECHOCARDIOGRAM  2009   Normal LV size and function. EF 60-65%. No regional wall motion and amount is. Mild aortic sclerosis, no stenosis  . TUBAL LIGATION  1979   Social History   Socioeconomic History  . Marital status: Married    Spouse name: Not on file  . Number of children: Not on file  . Years of education: Not on file  . Highest education level: Not on file  Occupational History  . Occupation: Radiation protection practitioner  . Occupation: retired  Scientific laboratory technician  . Financial resource strain: Not hard at all  . Food insecurity    Worry: Never true    Inability: Never true  . Transportation needs    Medical: No    Non-medical: No  Tobacco Use  .  Smoking status: Never Smoker  . Smokeless tobacco: Never Used  Substance and Sexual Activity  . Alcohol use: Not Currently    Alcohol/week: 0.0 standard drinks    Comment: rare  . Drug use: Yes    Types: Morphine  . Sexual activity: Not Currently  Lifestyle  . Physical activity    Days per week: 0 days    Minutes per session: 0 min  . Stress: Not at all  Relationships  . Social Herbalist on phone: Not on file    Gets together: Not on file    Attends religious service: Not on file    Active member of club or organization: Not on file    Attends meetings of clubs or organizations: Not on file    Relationship status: Not on file  . Intimate partner violence    Fear of current or ex partner: No    Emotionally abused: No    Physically abused: No    Forced sexual activity: No  Other Topics Concern  . Not on file  Social History  Narrative  . Not on file   Family History  Problem Relation Age of Onset  . Breast cancer Mother   . Diabetes Mother   . Heart disease Mother   . Allergies Mother   . Diabetes Father   . Heart disease Father   . Emphysema Father   . Allergies Father   . Stomach cancer Maternal Uncle   . Breast cancer Paternal Aunt   . Colon cancer Neg Hx    Allergies  Allergen Reactions  . Rizatriptan Benzoate Anaphylaxis    Throat and Tongue Swelling Closed  . Sumatriptan Other (See Comments)    Throat closed & Tongue Swelling  . Tizanidine Shortness Of Breath  . Aleve [Naproxen Sodium] Hives  . Clarithromycin Hives  . Cyclobenzaprine Hcl     REACTION: THROAT CLOSED,TOUNGE SWELLING  . Doxycycline Diarrhea and Nausea And Vomiting    Reaction: unknown  . Duloxetine Nausea Only  . Keflex [Cephalexin] Itching    Sores  . Metronidazole Diarrhea and Nausea Only  . Oxycodone Hcl Diarrhea    Vomiting  . Oxycontin [Oxycodone Hcl] Nausea And Vomiting  . Penicillins     TOLERATES CEPHALOSPORINS Reaction: unknown Has patient had a PCN reaction  causing immediate rash, facial/tongue/throat swelling, SOB or lightheadedness with hypotension: {unknown Has patient had a PCN reaction causing severe rash involving mucus membranes or skin necrosis: {unknown Has patient had a PCN reaction that required hospitalization {unknown Has patient had a PCN reaction occurring within the last 10 years: {no If all of the above answers are "NO", then may proceed with Cephalosporin use.  . Rosuvastatin     REACTION: MUSCLE ACHES  . Septra [Sulfamethoxazole-Trimethoprim] Other (See Comments)    Rash and itching  . Venlafaxine Hives  . Voltaren [Diclofenac Sodium] Hives    oral  . Tramadol Diarrhea, Nausea And Vomiting and Palpitations    Medication list has been reviewed and updated.   General: Denies fever, chills, sweats. No significant weight loss. Eyes: Denies blurring,significant itching ENT: Denies earache, sore throat, and hoarseness.  Cardiovascular: Denies chest pains, palpitations, dyspnea on exertion,  Respiratory: Denies cough, dyspnea at rest,wheeezing Breast: no concerns about lumps GI: Denies nausea, vomiting, diarrhea, constipation, change in bowel habits, abdominal pain, melena, hematochezia GU: Denies dysuria, hematuria, urinary hesitancy, nocturia, denies STD risk, no concerns about discharge Musculoskeletal: Denies back pain, joint pain Derm: Denies rash, itching Neuro: Denies  paresthesias, frequent falls, frequent headaches Psych: Denies depression, anxiety Endocrine: Denies cold intolerance, heat intolerance, polydipsia Heme: Denies enlarged lymph nodes Allergy: No hayfever  Objective:   BP 140/64   Pulse 66   Temp 98.2 F (36.8 C) (Temporal)   Ht 5\' 5"  (1.651 m)   Wt 223 lb 8 oz (101.4 kg)   SpO2 95%   BMI 37.19 kg/m  Ideal Body Weight: Weight in (lb) to have BMI = 25: 149.9 No exam data present Depression screen [facility] 2/9 01/18/2019 08/08/2018 04/24/2018 01/10/2018 11/24/2017  Decreased Interest 0 0 0 2 1  Down,  Depressed, Hopeless 0 0 0 0 1  PHQ - 2 Score 0 0 0 2 2  Altered sleeping 0 - - 0 -  Tired, decreased energy 3 - - 1 -  Change in appetite 0 - - 0 -  Feeling bad or failure about yourself  0 - - 0 -  Trouble concentrating 0 - - 1 -  Moving slowly or fidgety/restless 0 - - 0 -  Suicidal thoughts 0 - - 0 -  PHQ-9 Score 3 - - 4 -  Difficult doing work/chores Not difficult at all - - Not difficult at all Not difficult at all  Some recent data might be hidden     GEN: well developed, well nourished, no acute distress Eyes: conjunctiva and lids normal, PERRLA, EOMI ENT: TM clear, nares clear, oral exam WNL Neck: supple, no lymphadenopathy, no thyromegaly, no JVD Pulm: clear to auscultation and percussion, respiratory effort normal CV: regular rate and rhythm, S1-S2, no murmur, rub or gallop, no bruits Chest: no scars, masses, no lumps BREAST: breast exam declined GI: soft, non-tender; no hepatosplenomegaly, masses; active bowel sounds all quadrants GU: GU exam declined Lymph: no cervical, axillary or inguinal adenopathy MSK: gait normal, muscle tone and strength WNL, no joint swelling, effusions, discoloration, crepitus  SKIN: clear, good turgor, color WNL, no rashes, lesions, or ulcerations Neuro: normal mental status, normal strength, sensation, and motion Psych: alert; oriented to person, place and time, normally interactive and not anxious or depressed in appearance.   All labs reviewed with patient. Results for orders placed or performed in visit on 01/18/19  Vitamin B12  Result Value Ref Range   Vitamin B-12 545 211 - 911 pg/mL  Lipid panel  Result Value Ref Range   Cholesterol 186 0 - 200 mg/dL   Triglycerides 209.0 (H) 0.0 - 149.0 mg/dL   HDL 43.10 >39.00 mg/dL   VLDL 41.8 (H) 0.0 - 40.0 mg/dL   Total CHOL/HDL Ratio 4    NonHDL 143.23   VITAMIN D 25 Hydroxy (Vit-D Deficiency, Fractures)  Result Value Ref Range   VITD 36.09 30.00 - 100.00 ng/mL  TSH  Result Value Ref  Range   TSH 2.63 0.35 - 4.50 uIU/mL  CBC with Differential/Platelet  Result Value Ref Range   WBC 5.8 4.0 - 10.5 K/uL   RBC 4.10 3.87 - 5.11 Mil/uL   Hemoglobin 12.4 12.0 - 15.0 g/dL   HCT 36.7 36.0 - 46.0 %   MCV 89.6 78.0 - 100.0 fl   MCHC 33.8 30.0 - 36.0 g/dL   RDW 12.3 11.5 - 15.5 %   Platelets 252.0 150.0 - 400.0 K/uL   Neutrophils Relative % 60.0 43.0 - 77.0 %   Lymphocytes Relative 31.4 12.0 - 46.0 %   Monocytes Relative 6.1 3.0 - 12.0 %   Eosinophils Relative 2.1 0.0 - 5.0 %   Basophils Relative 0.4 0.0 - 3.0 %   Neutro Abs 3.5 1.4 - 7.7 K/uL   Lymphs Abs 1.8 0.7 - 4.0 K/uL   Monocytes Absolute 0.4 0.1 - 1.0 K/uL   Eosinophils Absolute 0.1 0.0 - 0.7 K/uL   Basophils Absolute 0.0 0.0 - 0.1 K/uL  Basic metabolic panel  Result Value Ref Range   Sodium 139 135 - 145 mEq/L   Potassium 4.3 3.5 - 5.1 mEq/L   Chloride 98 96 - 112 mEq/L   CO2 34 (H) 19 - 32 mEq/L   Glucose, Bld 115 (H) 70 - 99 mg/dL   BUN 14 6 - 23 mg/dL   Creatinine, Ser 0.69 0.40 - 1.20 mg/dL   Calcium 9.6 8.4 - 10.5 mg/dL   GFR 84.81 >60.00 mL/min  Hepatic function panel  Result Value Ref Range   Total Bilirubin 0.4 0.2 - 1.2 mg/dL   Bilirubin, Direct 0.1 0.0 - 0.3 mg/dL   Alkaline Phosphatase 112 39 - 117 U/L   AST 46 (H) 0 - 37 U/L   ALT 41 (H) 0 - 35 U/L   Total  Protein 6.9 6.0 - 8.3 g/dL   Albumin 4.4 3.5 - 5.2 g/dL  Microalbumin / creatinine urine ratio  Result Value Ref Range   Microalb, Ur <0.7 0.0 - 1.9 mg/dL   Creatinine,U 38.2 mg/dL   Microalb Creat Ratio 1.8 0.0 - 30.0 mg/g  Hemoglobin A1c  Result Value Ref Range   Hgb A1c MFr Bld 7.1 (H) 4.6 - 6.5 %  LDL cholesterol, direct  Result Value Ref Range   Direct LDL 125.0 mg/dL   Dg Foot Complete Left  Result Date: 01/08/2019 Please see detailed radiograph report in office note.   Assessment and Plan:     ICD-10-CM   1. Healthcare maintenance  Z00.00    Globally UTD Will try to find shingrix  Health Maintenance Exam: The  patient's preventative maintenance and recommended screening tests for an annual wellness exam were reviewed in full today. Brought up to date unless services declined.  Counselled on the importance of diet, exercise, and its role in overall health and mortality. The patient's FH and SH was reviewed, including their home life, tobacco status, and drug and alcohol status.  Follow-up in 1 year for physical exam or additional follow-up below.  Follow-up: No follow-ups on file. Or follow-up in 1 year if not noted.  Future Appointments  Date Time Provider [place]  02/12/2019  2:45 PM Edrick Kins, [place] [facility]  02/19/2019  2:40 PM Bayard Hugger, NP [facility]    No orders of the defined types were placed in this encounter.  There are no discontinued medications. No orders of the defined types were placed in this encounter.   Signed,  Maud Deed. Sharlotte Baka, MD   Allergies as of 01/24/2019      Reactions   Rizatriptan Benzoate Anaphylaxis   Throat and Tongue Swelling Closed   Sumatriptan Other (See Comments)   Throat closed & Tongue Swelling   Tizanidine Shortness Of Breath   Aleve [naproxen Sodium] Hives   Clarithromycin Hives   Cyclobenzaprine Hcl    REACTION: THROAT CLOSED,TOUNGE SWELLING   Doxycycline Diarrhea, Nausea And Vomiting   Reaction: unknown   Duloxetine Nausea Only   Keflex [cephalexin] Itching   Sores   Metronidazole Diarrhea, Nausea Only   Oxycodone Hcl Diarrhea   Vomiting   Oxycontin [oxycodone Hcl] Nausea And Vomiting   Penicillins    TOLERATES CEPHALOSPORINS Reaction: unknown Has patient had a PCN reaction causing immediate rash, facial/tongue/throat swelling, SOB or lightheadedness with hypotension: {unknown Has patient had a PCN reaction causing severe rash involving mucus membranes or skin necrosis: {unknown Has patient had a PCN reaction that required hospitalization {unknown Has patient had a PCN reaction occurring within  the last 10 years: {no If all of the above answers are "NO", then may proceed with Cephalosporin use.   Rosuvastatin    REACTION: MUSCLE ACHES   Septra [sulfamethoxazole-trimethoprim] Other (See Comments)   Rash and itching   Venlafaxine Hives   Voltaren [diclofenac Sodium] Hives   oral   Tramadol Diarrhea, Nausea And Vomiting, Palpitations      Medication List       Accurate as of January 24, 2019  2:38 PM. If you have any questions, ask your nurse or doctor.        B COMPLEX 100 PO Take 100 mg by mouth daily.   bisacodyl 5 MG EC tablet Commonly known as: DULCOLAX Take 5 mg by mouth daily as needed for moderate constipation. Take 1-2 at hs   CALCIUM  600 PO Take 600 mg by mouth 2 (two) times daily.   carboxymethylcellulose 0.5 % Soln Commonly known as: REFRESH PLUS Place 1 drop into both eyes 3 (three) times daily as needed.   cetirizine 10 MG tablet Commonly known as: ZYRTEC Take 10 mg by mouth daily.   citalopram 40 MG tablet Commonly known as: CELEXA TAKE 1 TABLET BY MOUTH  DAILY   Coenzyme Q-10 100 MG capsule Take 100 mg by mouth daily.   diclofenac sodium 1 % Gel Commonly known as: VOLTAREN Apply 4 g topically 4 (four) times daily as needed.   docusate sodium 100 MG capsule Commonly known as: COLACE Take 400 mg by mouth at bedtime.   gabapentin 300 MG capsule Commonly known as: NEURONTIN Take 1 capsule (300 mg total) by mouth 3 (three) times daily.   GRAPE SEED EXTRACT PO Take by mouth. 1 tablet daily   levothyroxine 50 MCG tablet Commonly known as: SYNTHROID TAKE 1 TABLET BY MOUTH  DAILY   MAGNESIUM GLYCINATE PLUS PO Take 400 mg by mouth at bedtime.   methocarbamol 500 MG tablet Commonly known as: ROBAXIN TAKE ONE (1) TABLET BY MOUTH EVERY EIGHTHOURS AS NEEDED FOR MUSCLE SPASMS   mirtazapine 15 MG tablet Commonly known as: REMERON TAKE 1 TABLET BY MOUTH AT  BEDTIME   morphine 15 MG 12 hr tablet Commonly known as: MS Contin Take 1 tablet  (15 mg total) by mouth 3 (three) times daily.   nitroGLYCERIN 0.4 MG SL tablet Commonly known as: NITROSTAT Place 1 tablet (0.4 mg total) under the tongue every 5 (five) minutes as needed for chest pain.   NON FORMULARY Daith piercing in right ear for headaches   oxybutynin 10 MG 24 hr tablet Commonly known as: DITROPAN-XL Take 1 tablet (10 mg total) by mouth at bedtime.   RABEprazole 20 MG tablet Commonly known as: ACIPHEX Take 20 mg by mouth daily.   TURMERIC PO Take 800 mg by mouth daily.   Vitamin D 50 MCG (2000 UT) tablet Take 4,000 Units by mouth daily.

## 2019-02-12 ENCOUNTER — Other Ambulatory Visit: Payer: Self-pay | Admitting: Registered Nurse

## 2019-02-12 ENCOUNTER — Encounter: Payer: Self-pay | Admitting: Podiatry

## 2019-02-12 ENCOUNTER — Other Ambulatory Visit: Payer: Self-pay

## 2019-02-12 ENCOUNTER — Ambulatory Visit (INDEPENDENT_AMBULATORY_CARE_PROVIDER_SITE_OTHER): Payer: Medicare Other | Admitting: Podiatry

## 2019-02-12 ENCOUNTER — Other Ambulatory Visit: Payer: Self-pay | Admitting: Family Medicine

## 2019-02-12 DIAGNOSIS — M7752 Other enthesopathy of left foot: Secondary | ICD-10-CM | POA: Diagnosis not present

## 2019-02-13 ENCOUNTER — Other Ambulatory Visit: Payer: Self-pay | Admitting: Family Medicine

## 2019-02-14 NOTE — Progress Notes (Signed)
   HPI: 68 y.o. female presenting today for evaluation of left foot pain. She states her pain has improved a little. She reports tightness around the foot. She has been using the met pads as directed for treatment. There are no worsening factors noted. Patient is here for further evaluation and treatment.   Past Medical History:  Diagnosis Date  . Allergy   . Anemia   . Arthritis   . B12 deficiency   . Complication of anesthesia    slow to wake up   . Depression   . Diabetes mellitus type 2, diet-controlled ([place]) 07/24/2018  . Diabetes mellitus without complication ([place])    type II - diet controlled ; A1C 6.5 01/2017  . Fibromyalgia   . Generalized hyperhidrosis 04/17/2014  . GERD (gastroesophageal reflux disease)   . Heart murmur    Aortic Sclerosis  . Hyperlipemia   . HYPOTHYROIDISM 05/29/2007  . Irritable bowel syndrome   . Migraines    hx of   . Oxygen deficiency    2 L at bedtime  . 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 PONV (postoperative nausea and vomiting)   . PVC (premature ventricular contraction)   . Type II or unspecified type diabetes mellitus with neurological manifestations, not stated as uncontrolled(250.60) 04/07/2013  . Unspecified essential hypertension    denies htn on 05/09/16     Physical Exam: General: The patient is alert and oriented x3 in no acute distress.  Dermatology: Skin is warm, dry and supple bilateral lower extremities. Negative for open lesions or macerations.  Vascular: Palpable pedal pulses bilaterally. No edema or erythema noted. Capillary refill within normal limits.  Neurological: Epicritic and protective threshold grossly intact bilaterally.   Musculoskeletal Exam: Range of motion within normal limits to all pedal and ankle joints bilateral. Muscle strength 5/5 in all groups bilateral.  Pain on palpation and range of motion noted to the third MTPJ also pain to the metatarsal head plantarly.  Assessment:  1. 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 Patient evaluated.  2. Continue using met pads.  3. Recommended good shoe gear.  4. Patient wants to hold off on custom orthotics at this time.  5. Return to clinic as needed.       Edrick Kins, DPM [facility]  Dr. Edrick Kins, DPM    [address], [place] [address]                Office [phone]  Fax [phone]

## 2019-02-19 ENCOUNTER — Other Ambulatory Visit: Payer: Self-pay

## 2019-02-19 ENCOUNTER — Encounter: Payer: Self-pay | Admitting: Registered Nurse

## 2019-02-19 ENCOUNTER — Encounter: Payer: Medicare Other | Attending: Physical Medicine & Rehabilitation | Admitting: Registered Nurse

## 2019-02-19 VITALS — BP 129/69 | HR 65 | Temp 98.7°F | Resp 16 | Ht 65.0 in | Wt 225.0 lb

## 2019-02-19 DIAGNOSIS — M25511 Pain in right shoulder: Secondary | ICD-10-CM | POA: Diagnosis not present

## 2019-02-19 DIAGNOSIS — Z79891 Long term (current) use of opiate analgesic: Secondary | ICD-10-CM | POA: Diagnosis not present

## 2019-02-19 DIAGNOSIS — M961 Postlaminectomy syndrome, not elsewhere classified: Secondary | ICD-10-CM

## 2019-02-19 DIAGNOSIS — G8929 Other chronic pain: Secondary | ICD-10-CM

## 2019-02-19 DIAGNOSIS — M546 Pain in thoracic spine: Secondary | ICD-10-CM

## 2019-02-19 DIAGNOSIS — G894 Chronic pain syndrome: Secondary | ICD-10-CM | POA: Diagnosis not present

## 2019-02-19 DIAGNOSIS — Z79899 Other long term (current) drug therapy: Secondary | ICD-10-CM | POA: Insufficient documentation

## 2019-02-19 DIAGNOSIS — Z5181 Encounter for therapeutic drug level monitoring: Secondary | ICD-10-CM | POA: Diagnosis not present

## 2019-02-19 MED ORDER — MORPHINE SULFATE ER 15 MG PO TBCR: 15.0000 mg | EXTENDED_RELEASE_TABLET | Freq: Three times a day (TID) | ORAL | 0 refills | Status: DC

## 2019-02-19 NOTE — Progress Notes (Signed)
Subjective:    Patient ID: [name], female    DOB: 1951/02/01, 67 y.o.   MRN: [id number]  [id number] [name] is a 68 y.o. female who returns for follow up appointment for chronic pain and medication refill. She states her pain is located in her right shoulder and mid- back pain. She rates her pain 4. Her currentxercise regime is walking and performing stretching exercises.  [name] Morphine equivalent is 45.00  MME. UDS ordered today.    Pain Inventory Average Pain 5 Pain Right Now 4 My pain is constant, burning and aching  In the last 24 hours, has pain interfered with the following? General activity 5 Relation with others 5 Enjoyment of life 5 What TIME of day is your pain at its worst? evening Sleep (in general) Good  Pain is worse with: walking, bending, standing and some activites Pain improves with: rest, heat/ice and medication Relief from Meds: 5  Mobility walk without assistance how many minutes can you walk? 10-15 ability to climb steps?  yes do you drive?  yes Do you have any goals in this area?  yes  Function retired I need assistance with the following:  meal prep, household duties and shopping Do you have any goals in this area?  yes  Neuro/Psych bladder control problems weakness numbness tremor tingling depression  Prior Studies Any changes since last visit?  no  Physicians involved in your care Any changes since last visit?  no   Family History  Problem Relation Age of Onset  . Breast cancer Mother   . Diabetes Mother   . Heart disease Mother   . Allergies Mother   . Diabetes Father   . Heart disease Father   . Emphysema Father   . Allergies Father   . Stomach cancer Maternal Uncle   . Breast cancer Paternal Aunt   . Colon cancer Neg Hx    Social History   Socioeconomic History  . Marital status: Married    Spouse name: Not on file  . Number of children: Not on file  . Years of education: Not on file  . Highest  education level: Not on file  Occupational History  . Occupation: Radiation protection practitioner  . Occupation: retired  Scientific laboratory technician  . Financial resource strain: Not hard at all  . Food insecurity    Worry: Never true    Inability: Never true  . Transportation needs    Medical: No    Non-medical: No  Tobacco Use  . Smoking status: Never Smoker  . Smokeless tobacco: Never Used  Substance and Sexual Activity  . Alcohol use: Not Currently    Alcohol/week: 0.0 standard drinks    Comment: rare  . Drug use: Yes    Types: Morphine  . Sexual activity: Not Currently  Lifestyle  . Physical activity    Days per week: 0 days    Minutes per session: 0 min  . Stress: Not at all  Relationships  . Social Herbalist on phone: Not on file    Gets together: Not on file    Attends religious service: Not on file    Active member of club or organization: Not on file    Attends meetings of clubs or organizations: Not on file    Relationship status: Not on file  Other Topics Concern  . Not on file  Social History Narrative  . Not on file   Past Surgical History:  Procedure Laterality Date  .  [place] STUDY N/A 01/04/2016   Procedure: [place] STUDY;  Surgeon: Manus Gunning, MD;  Location: [facility];  Service: Gastroenterology;  Laterality: N/A;  . ABDOMINAL HYSTERECTOMY  1988  . ADENOIDECTOMY    . ANTERIOR CERVICAL DECOMP/DISCECTOMY FUSION  2009, 1993   first procedure in ~ 1993/Dr Botero  . APPENDECTOMY  1988  . BACK SURGERY    . CHOLECYSTECTOMY  2005  . ESOPHAGEAL MANOMETRY N/A 01/04/2016   Procedure: ESOPHAGEAL MANOMETRY (EM) 24 HR PH;  Surgeon: Manus Gunning, MD;  Location: [facility];  Service: Gastroenterology;  Laterality: N/A;  . ESOPHAGOGASTRODUODENOSCOPY (EGD) WITH PROPOFOL N/A 05/17/2016   Procedure: ESOPHAGOGASTRODUODENOSCOPY (EGD) WITH PROPOFOL;  Surgeon: Manus Gunning, MD;  Location: [facility];  Service: Gastroenterology;  Laterality: N/A;  .  NASAL SINUS SURGERY  2006  . SAVORY DILATION N/A 05/17/2016   Procedure: SAVORY DILATION;  Surgeon: Manus Gunning, MD;  Location: [facility];  Service: Gastroenterology;  Laterality: N/A;  . TONSILLECTOMY AND ADENOIDECTOMY    . TRANSTHORACIC ECHOCARDIOGRAM  2009   Normal LV size and function. EF 60-65%. No regional wall motion and amount is. Mild aortic sclerosis, no stenosis  . TUBAL LIGATION  1979   Past Medical History:  Diagnosis Date  . Allergy   . Anemia   . Arthritis   . B12 deficiency   . Complication of anesthesia    slow to wake up   . Depression   . Diabetes mellitus type 2, diet-controlled ([place]) 07/24/2018  . Diabetes mellitus without complication ([place])    type II - diet controlled ; A1C 6.5 01/2017  . Fibromyalgia   . Generalized hyperhidrosis 04/17/2014  . GERD (gastroesophageal reflux disease)   . Heart murmur    Aortic Sclerosis  . Hyperlipemia   . HYPOTHYROIDISM 05/29/2007  . Irritable bowel syndrome   . Migraines    hx of   . Oxygen deficiency    2 L at bedtime  . 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 PONV (postoperative nausea and vomiting)   . PVC (premature ventricular contraction)   . Type II or unspecified type diabetes mellitus with neurological manifestations, not stated as uncontrolled(250.60) 04/07/2013  . Unspecified essential hypertension    denies htn on 05/09/16   There were no vitals taken for this visit.  Opioid Risk Score:   Fall Risk Score:  `1  Depression screen PHQ 2/9  Depression screen [facility] 2/9 01/18/2019 08/08/2018 04/24/2018 01/10/2018 11/24/2017 03/27/2017 02/28/2017  Decreased Interest 0 0 0 2 1 0 1  Down, Depressed, Hopeless 0 0 0 0 1 0 1  PHQ - 2 Score 0 0 0 2 2 0 2  Altered sleeping 0 - - 0 - - -  Tired, decreased energy 3 - - 1 - - -  Change in appetite 0 - - 0 - - -  Feeling bad or failure about yourself  0 - - 0 - - -  Trouble concentrating 0 - - 1 - - -  Moving slowly or  fidgety/restless 0 - - 0 - - -  Suicidal thoughts 0 - - 0 - - -  PHQ-9 Score 3 - - 4 - - -  Difficult doing work/chores Not difficult at all - - Not difficult at all Not difficult at all - -  Some recent data might be hidden     Review of Systems  Constitutional: Positive for diaphoresis.  HENT: Negative.   Eyes: Negative.   Respiratory: Negative.   Cardiovascular: Positive for leg swelling.  Gastrointestinal: Negative.   Endocrine: Negative.   Genitourinary: Negative.   Musculoskeletal: Positive for back pain and myalgias.  Skin: Negative.   Allergic/Immunologic: Negative.   Neurological: Positive for tremors, weakness and numbness.  Psychiatric/Behavioral: Positive for dysphoric mood.  All other systems reviewed and are negative.      Objective:   Physical Exam Vitals signs and nursing note reviewed.  Constitutional:      Appearance: Normal appearance.  Neck:     Musculoskeletal: Normal range of motion and neck supple.  Cardiovascular:     Rate and Rhythm: Normal rate and regular rhythm.     Pulses: Normal pulses.     Heart sounds: Normal heart sounds.  Pulmonary:     Effort: Pulmonary effort is normal.     Breath sounds: Normal breath sounds.  Musculoskeletal:     Comments: Normal Muscle Bulk and Muscle Testing Reveals:  Upper Extremities: Full ROM and Muscle Strength 5/5 Right AC Joint Tenderness  Thoracic Paraspinal Tenderness: T-7-T-9  Lower Extremities: Full ROM and Muscle Strength 5/5 Arises from table with ease Narrow Based Gait   Skin:    General: Skin is warm and dry.  Neurological:     Mental Status: She is alert and oriented to person, place, and time.  Psychiatric:        Mood and Affect: Mood normal.        Behavior: Behavior normal.           Assessment & Plan:  1. Cervical postlaminectomy syndrome:/ Cervicalgia/ Cervical Radiculitis/with neck and shoulder pain/ Chronic Midline Thoracic Pain.02/19/2019.Continue exercise routine and  using heat therapy.Refilled:current medication regimen withMS Contin 15 mg one tablet three times a day #90.  We will continue the opioid monitoring program, this consists of regular clinic visits, examinations, urine drug screen, pill counts as well as use of [place] Controlled Substance reporting System. 2. Fibromyalgia/ Neuropathy: Continuecurrent medication regimen withGabapentin, activity and exercise regime.02/19/2019. 3.BilateralKnee Pain/ Mild Degenerative Changes:.Continuecurrent medication regimen withVoltaren Gel.02/19/2019. 4.BilateralAnkle Pain:No complaints today.Continue HEP as tolerated. Continueto monitor.02/19/2019. 5. Muscle Spasm: Continuecurrent medication regimen withRobaxin. 02/19/2019. 6. Opioid Induced Constipation: No complaints Continue to Monitor.02/19/2019. 7. Midline Low Back Pain: Continue current medication regime, continue HEP as tolerated.02/19/2019. 8. Right Gluteal Medius Weakness and Atrophy: No complaints Today:Continue with Hip Abduction Exercisesas tolerated: Continue to Monitor.02/19/2019 9. Right Greater Trochanter Bursitis:No Complaints Today.Continue to Alternate Ice and Heat Therapy. Continue to Monitor. 02/19/2019 10. Sacroiliac Joint Pain:No complaints today.S/PSacroiliac Injection on 08/24/2018 with good relief noted. 02/19/2019 11. Chronic  low back pain without sciatica:No complaints today. Continue HEP as tolerated. Continue current medication regimen. Continue to monitor.02/19/2019.  F/U in 1 month

## 2019-02-21 ENCOUNTER — Telehealth: Payer: Self-pay | Admitting: *Deleted

## 2019-02-21 LAB — TOXASSURE SELECT,+ANTIDEPR,UR

## 2019-02-21 NOTE — Telephone Encounter (Signed)
Urine drug screen for this encounter is consistent for prescribed medication 

## 2019-02-22 DIAGNOSIS — J9610 Chronic respiratory failure, unspecified whether with hypoxia or hypercapnia: Secondary | ICD-10-CM | POA: Diagnosis not present

## 2019-03-19 ENCOUNTER — Encounter: Payer: Self-pay | Admitting: Registered Nurse

## 2019-03-19 ENCOUNTER — Other Ambulatory Visit: Payer: Self-pay

## 2019-03-19 ENCOUNTER — Encounter: Payer: Medicare Other | Attending: Physical Medicine & Rehabilitation | Admitting: Registered Nurse

## 2019-03-19 VITALS — BP 127/75 | HR 62 | Temp 97.3°F | Ht 65.0 in | Wt 223.0 lb

## 2019-03-19 DIAGNOSIS — G894 Chronic pain syndrome: Secondary | ICD-10-CM | POA: Insufficient documentation

## 2019-03-19 DIAGNOSIS — M546 Pain in thoracic spine: Secondary | ICD-10-CM

## 2019-03-19 DIAGNOSIS — M961 Postlaminectomy syndrome, not elsewhere classified: Secondary | ICD-10-CM

## 2019-03-19 DIAGNOSIS — Z79899 Other long term (current) drug therapy: Secondary | ICD-10-CM | POA: Diagnosis not present

## 2019-03-19 DIAGNOSIS — Z5181 Encounter for therapeutic drug level monitoring: Secondary | ICD-10-CM | POA: Insufficient documentation

## 2019-03-19 DIAGNOSIS — Z79891 Long term (current) use of opiate analgesic: Secondary | ICD-10-CM

## 2019-03-19 DIAGNOSIS — M542 Cervicalgia: Secondary | ICD-10-CM

## 2019-03-19 DIAGNOSIS — G8929 Other chronic pain: Secondary | ICD-10-CM

## 2019-03-19 MED ORDER — MORPHINE SULFATE ER 15 MG PO TBCR: 15.0000 mg | EXTENDED_RELEASE_TABLET | Freq: Three times a day (TID) | ORAL | 0 refills | Status: DC

## 2019-03-19 NOTE — Progress Notes (Signed)
Subjective:    Patient ID: [name], female    DOB: 1951/04/21, 68 y.o.   MRN: [id number]  HPI: [name] is a 68 y.o. female who returns for follow up appointment for chronic pain and medication refill. She states her pain is located in her neck and mid- back. She rates her pain 5. Her current exercise regime is walking and performing stretching exercises.  [name] Morphine equivalent is 45.00  MME. Last UDS was performed on 02/19/2019, it was consistent.    Pain Inventory Average Pain 7 Pain Right Now 5 My pain is constant, burning and aching  In the last 24 hours, has pain interfered with the following? General activity 7 Relation with others 7 Enjoyment of life 6 What TIME of day is your pain at its worst? evening, night  Sleep (in general) NA  Pain is worse with: walking, bending, standing and some activites Pain improves with: rest, heat/ice and medication Relief from Meds: 4  Mobility walk without assistance how many minutes can you walk? 10-15 ability to climb steps?  yes do you drive?  yes Do you have any goals in this area?  yes  Function disabled: date disabled . retired I need assistance with the following:  meal prep, household duties and shopping Do you have any goals in this area?  yes  Neuro/Psych bladder control problems weakness numbness tremor depression  Prior Studies Any changes since last visit?  no  Physicians involved in your care Any changes since last visit?  no   Family History  Problem Relation Age of Onset  . Breast cancer Mother   . Diabetes Mother   . Heart disease Mother   . Allergies Mother   . Diabetes Father   . Heart disease Father   . Emphysema Father   . Allergies Father   . Stomach cancer Maternal Uncle   . Breast cancer Paternal Aunt   . Colon cancer Neg Hx    Social History   Socioeconomic History  . Marital status: Married    Spouse name: Not on file  . Number of children: Not on file  .  Years of education: Not on file  . Highest education level: Not on file  Occupational History  . Occupation: Radiation protection practitioner  . Occupation: retired  Scientific laboratory technician  . Financial resource strain: Not hard at all  . Food insecurity    Worry: Never true    Inability: Never true  . Transportation needs    Medical: No    Non-medical: No  Tobacco Use  . Smoking status: Never Smoker  . Smokeless tobacco: Never Used  Substance and Sexual Activity  . Alcohol use: Not Currently    Alcohol/week: 0.0 standard drinks    Comment: rare  . Drug use: Yes    Types: Morphine  . Sexual activity: Not Currently  Lifestyle  . Physical activity    Days per week: 0 days    Minutes per session: 0 min  . Stress: Not at all  Relationships  . Social Herbalist on phone: Not on file    Gets together: Not on file    Attends religious service: Not on file    Active member of club or organization: Not on file    Attends meetings of clubs or organizations: Not on file    Relationship status: Not on file  Other Topics Concern  . Not on file  Social History Narrative  . Not  on file   Past Surgical History:  Procedure Laterality Date  . [place] STUDY N/A 01/04/2016   Procedure: [place] STUDY;  Surgeon: Manus Gunning, MD;  Location: [facility];  Service: Gastroenterology;  Laterality: N/A;  . ABDOMINAL HYSTERECTOMY  1988  . ADENOIDECTOMY    . ANTERIOR CERVICAL DECOMP/DISCECTOMY FUSION  2009, 1993   first procedure in ~ 1993/Dr Botero  . APPENDECTOMY  1988  . BACK SURGERY    . CHOLECYSTECTOMY  2005  . ESOPHAGEAL MANOMETRY N/A 01/04/2016   Procedure: ESOPHAGEAL MANOMETRY (EM) 24 HR PH;  Surgeon: Manus Gunning, MD;  Location: [facility];  Service: Gastroenterology;  Laterality: N/A;  . ESOPHAGOGASTRODUODENOSCOPY (EGD) WITH PROPOFOL N/A 05/17/2016   Procedure: ESOPHAGOGASTRODUODENOSCOPY (EGD) WITH PROPOFOL;  Surgeon: Manus Gunning, MD;  Location: [facility];   Service: Gastroenterology;  Laterality: N/A;  . NASAL SINUS SURGERY  2006  . SAVORY DILATION N/A 05/17/2016   Procedure: SAVORY DILATION;  Surgeon: Manus Gunning, MD;  Location: [facility];  Service: Gastroenterology;  Laterality: N/A;  . TONSILLECTOMY AND ADENOIDECTOMY    . TRANSTHORACIC ECHOCARDIOGRAM  2009   Normal LV size and function. EF 60-65%. No regional wall motion and amount is. Mild aortic sclerosis, no stenosis  . TUBAL LIGATION  1979   Past Medical History:  Diagnosis Date  . Allergy   . Anemia   . Arthritis   . B12 deficiency   . Complication of anesthesia    slow to wake up   . Depression   . Diabetes mellitus type 2, diet-controlled ([place]) 07/24/2018  . Diabetes mellitus without complication ([place])    type II - diet controlled ; A1C 6.5 01/2017  . Fibromyalgia   . Generalized hyperhidrosis 04/17/2014  . GERD (gastroesophageal reflux disease)   . Heart murmur    Aortic Sclerosis  . Hyperlipemia   . HYPOTHYROIDISM 05/29/2007  . Irritable bowel syndrome   . Migraines    hx of   . Oxygen deficiency    2 L at bedtime  . 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 PONV (postoperative nausea and vomiting)   . PVC (premature ventricular contraction)   . Type II or unspecified type diabetes mellitus with neurological manifestations, not stated as uncontrolled(250.60) 04/07/2013  . Unspecified essential hypertension    denies htn on 05/09/16   BP 127/75   Pulse 62   Temp (!) 97.3 F (36.3 C)   Ht 5\' 5"  (1.651 m)   Wt 223 lb (101.2 kg)   SpO2 97%   BMI 37.11 kg/m   Opioid Risk Score:   Fall Risk Score:  `1  Depression screen PHQ 2/9  Depression screen [facility] 2/9 01/18/2019 08/08/2018 04/24/2018 01/10/2018 11/24/2017 03/27/2017 02/28/2017  Decreased Interest 0 0 0 2 1 0 1  Down, Depressed, Hopeless 0 0 0 0 1 0 1  PHQ - 2 Score 0 0 0 2 2 0 2  Altered sleeping 0 - - 0 - - -  Tired, decreased energy 3 - - 1 - - -  Change in appetite 0 -  - 0 - - -  Feeling bad or failure about yourself  0 - - 0 - - -  Trouble concentrating 0 - - 1 - - -  Moving slowly or fidgety/restless 0 - - 0 - - -  Suicidal thoughts 0 - - 0 - - -  PHQ-9 Score 3 - -  4 - - -  Difficult doing work/chores Not difficult at all - - Not difficult at all Not difficult at all - -  Some recent data might be hidden    Review of Systems  Constitutional: Negative.   HENT: Negative.   Eyes: Negative.   Respiratory: Negative.   Cardiovascular: Positive for leg swelling.  Gastrointestinal: Negative.   Endocrine: Negative.   Genitourinary: Positive for difficulty urinating.  Musculoskeletal: Positive for back pain and neck pain.  Skin: Negative.   Allergic/Immunologic: Negative.   Neurological: Positive for tremors, weakness and numbness.  Psychiatric/Behavioral: Positive for dysphoric mood.  All other systems reviewed and are negative.      Objective:   Physical Exam Vitals signs and nursing note reviewed.  Constitutional:      Appearance: Normal appearance.  Neck:     Musculoskeletal: Normal range of motion and neck supple.  Cardiovascular:     Rate and Rhythm: Normal rate and regular rhythm.     Pulses: Normal pulses.     Heart sounds: Normal heart sounds.  Pulmonary:     Effort: Pulmonary effort is normal.     Breath sounds: Normal breath sounds.  Musculoskeletal:     Comments: Normal Muscle Bulk and Muscle Testing Reveals:  Upper Extremities: Full ROM and Muscle Strength 5/5 Thoracic Paraspinal Tenderness: T-7-T-9  Lower Extremities: Full ROM and Muscle Strength 5/5 Arises from Table with ease Narrow Based Gait   Skin:    General: Skin is warm and dry.  Neurological:     Mental Status: She is alert and oriented to person, place, and time.  Psychiatric:        Mood and Affect: Mood normal.        Behavior: Behavior normal.           Assessment & Plan:  1. Cervical postlaminectomy syndrome:/ Cervicalgia/ Cervical Radiculitis/with  neck and shoulder pain/ Chronic Midline Thoracic Pain.03/19/2019.Continue exercise routine and using heat therapy.Refilled:current medication regimen withMS Contin 15 mg one tablet three times a day #90.  We will continue the opioid monitoring program, this consists of regular clinic visits, examinations, urine drug screen, pill counts as well as use of [place] Controlled Substance reporting System. 2. Fibromyalgia/ Neuropathy: Continuecurrent medication regimen withGabapentin, activity and exercise regime.03/19/2019. 3.BilateralKnee Pain/ Mild Degenerative Changes:.Continuecurrent medication regimen withVoltaren Gel.03/19/2019. 4.BilateralAnkle Pain:No complaints today.Continue HEP as tolerated. Continueto monitor.03/19/2019. 5. Muscle Spasm: Continuecurrent medication regimen withRobaxin. 03/19/2019. 6. Opioid Induced Constipation: No complaints Continue to Monitor.03/19/2019. 7. Midline Low Back Pain: Continue current medication regime, continue HEP as tolerated.03/19/2019. 8. Right Gluteal Medius Weakness and Atrophy: No complaints Today:Continue with Hip Abduction Exercisesas tolerated: Continue to Monitor.03/19/2019 9. Right Greater Trochanter Bursitis:No Complaints Today.Continue to Alternate Ice and Heat Therapy. Continue to Monitor. 03/19/2019 10. Sacroiliac Joint Pain:No complaints today.S/PSacroiliac Injection on 08/24/2018 with good relief noted. 03/19/2019 11. Chronic low back pain without sciatica:No complaints today. Continue HEP as tolerated. Continue current medication regimen. Continue to monitor.03/19/2019.  F/U in 1 month

## 2019-03-22 ENCOUNTER — Other Ambulatory Visit: Payer: Self-pay | Admitting: Family Medicine

## 2019-03-25 DIAGNOSIS — J9610 Chronic respiratory failure, unspecified whether with hypoxia or hypercapnia: Secondary | ICD-10-CM | POA: Diagnosis not present

## 2019-03-29 ENCOUNTER — Telehealth: Payer: Medicare Other | Admitting: Physician Assistant

## 2019-03-29 DIAGNOSIS — Z20828 Contact with and (suspected) exposure to other viral communicable diseases: Secondary | ICD-10-CM

## 2019-03-29 DIAGNOSIS — Z20822 Contact with and (suspected) exposure to covid-19: Secondary | ICD-10-CM

## 2019-03-29 NOTE — Progress Notes (Signed)
  E-Visit for Corona Virus Screening  Based on what you have shared with me, you need to seek an evaluation for a severe illness that is causing your symptoms which may be coronavirus or some other illness. I recommend that you be seen and evaluated "face to face". If you are considered high risk for Corona virus because of a known exposure, fever, shortness of breath and cough, OR if you have severe symptoms of any kind, seek medical care at an emergency room. Our Emergency Departments are best equipped to handle patients with severe symptoms.  You will be evaluated by the ER provider (or higher level of care provider) who will determine whether you need formal testing.  If you are having a true medical emergency please call 911.   I recommend the following:  . [facility] Emergency Department [address], [place], [place] [address] [phone]  . [facility] [place] Emergency Department [address], [place], [place] [address] [phone]  . [facility] Emergency Department [address], [place], [place] [address] [phone]  . [facility] Emergency Department [address], [place], [place] [address] [phone]  . [facility] Emergency Department [address], [place], [place] [address] [phone]  NOTE: If you entered your credit card information for this eVisit, you will not be charged. You may see a "hold" on your card for the $35 but that hold will drop off and you will not have a charge processed.   Your e-visit answers were reviewed by a board certified advanced clinical practitioner to complete your personal care plan.  Thank you for using e-Visits.  Greater than 5 minutes, yet less than 10 minutes of time have been spent researching, coordinating, and implementing care for this patient today  

## 2019-04-01 ENCOUNTER — Other Ambulatory Visit: Payer: Self-pay

## 2019-04-01 ENCOUNTER — Emergency Department (HOSPITAL_COMMUNITY): Payer: Medicare Other

## 2019-04-01 ENCOUNTER — Inpatient Hospital Stay (HOSPITAL_COMMUNITY)
Admission: EM | Admit: 2019-04-01 | Discharge: 2019-04-04 | DRG: 310 | Disposition: A | Discharge status: Home / Self Care | Payer: Medicare Other | Source: Ambulatory Visit | Attending: Internal Medicine | Admitting: Internal Medicine

## 2019-04-01 ENCOUNTER — Telehealth: Payer: Self-pay

## 2019-04-01 ENCOUNTER — Encounter: Payer: Self-pay | Admitting: Emergency Medicine

## 2019-04-01 ENCOUNTER — Encounter (HOSPITAL_COMMUNITY): Payer: Self-pay

## 2019-04-01 ENCOUNTER — Encounter (HOSPITAL_COMMUNITY): Payer: Self-pay | Admitting: Internal Medicine

## 2019-04-01 ENCOUNTER — Observation Stay (HOSPITAL_COMMUNITY): Payer: Medicare Other

## 2019-04-01 ENCOUNTER — Ambulatory Visit
Admission: EM | Admit: 2019-04-01 | Discharge: 2019-04-01 | Disposition: A | Discharge status: Home / Self Care | Payer: Medicare Other | Source: Home / Self Care

## 2019-04-01 DIAGNOSIS — Z6836 Body mass index (BMI) 36.0-36.9, adult: Secondary | ICD-10-CM

## 2019-04-01 DIAGNOSIS — E039 Hypothyroidism, unspecified: Secondary | ICD-10-CM | POA: Diagnosis present

## 2019-04-01 DIAGNOSIS — R7402 Elevation of levels of lactic acid dehydrogenase (LDH): Secondary | ICD-10-CM | POA: Diagnosis not present

## 2019-04-01 DIAGNOSIS — R42 Dizziness and giddiness: Secondary | ICD-10-CM | POA: Diagnosis not present

## 2019-04-01 DIAGNOSIS — Z9981 Dependence on supplemental oxygen: Secondary | ICD-10-CM

## 2019-04-01 DIAGNOSIS — R609 Edema, unspecified: Secondary | ICD-10-CM | POA: Diagnosis not present

## 2019-04-01 DIAGNOSIS — Z8249 Family history of ischemic heart disease and other diseases of the circulatory system: Secondary | ICD-10-CM

## 2019-04-01 DIAGNOSIS — I4430 Unspecified atrioventricular block: Secondary | ICD-10-CM | POA: Diagnosis present

## 2019-04-01 DIAGNOSIS — E038 Other specified hypothyroidism: Secondary | ICD-10-CM

## 2019-04-01 DIAGNOSIS — Z803 Family history of malignant neoplasm of breast: Secondary | ICD-10-CM | POA: Diagnosis not present

## 2019-04-01 DIAGNOSIS — E785 Hyperlipidemia, unspecified: Secondary | ICD-10-CM | POA: Diagnosis not present

## 2019-04-01 DIAGNOSIS — I340 Nonrheumatic mitral (valve) insufficiency: Secondary | ICD-10-CM | POA: Diagnosis not present

## 2019-04-01 DIAGNOSIS — I483 Typical atrial flutter: Secondary | ICD-10-CM

## 2019-04-01 DIAGNOSIS — Z881 Allergy status to other antibiotic agents status: Secondary | ICD-10-CM

## 2019-04-01 DIAGNOSIS — R0789 Other chest pain: Secondary | ICD-10-CM | POA: Diagnosis not present

## 2019-04-01 DIAGNOSIS — IMO0002 Reserved for concepts with insufficient information to code with codable children: Secondary | ICD-10-CM | POA: Diagnosis present

## 2019-04-01 DIAGNOSIS — R197 Diarrhea, unspecified: Secondary | ICD-10-CM | POA: Diagnosis not present

## 2019-04-01 DIAGNOSIS — R531 Weakness: Secondary | ICD-10-CM | POA: Diagnosis not present

## 2019-04-01 DIAGNOSIS — R7401 Elevation of levels of liver transaminase levels: Secondary | ICD-10-CM | POA: Diagnosis not present

## 2019-04-01 DIAGNOSIS — Z20828 Contact with and (suspected) exposure to other viral communicable diseases: Secondary | ICD-10-CM | POA: Diagnosis present

## 2019-04-01 DIAGNOSIS — Z20822 Contact with and (suspected) exposure to covid-19: Secondary | ICD-10-CM

## 2019-04-01 DIAGNOSIS — M797 Fibromyalgia: Secondary | ICD-10-CM | POA: Diagnosis not present

## 2019-04-01 DIAGNOSIS — Z7982 Long term (current) use of aspirin: Secondary | ICD-10-CM | POA: Diagnosis not present

## 2019-04-01 DIAGNOSIS — R5383 Other fatigue: Secondary | ICD-10-CM | POA: Diagnosis not present

## 2019-04-01 DIAGNOSIS — I2510 Atherosclerotic heart disease of native coronary artery without angina pectoris: Secondary | ICD-10-CM | POA: Diagnosis not present

## 2019-04-01 DIAGNOSIS — R945 Abnormal results of liver function studies: Secondary | ICD-10-CM | POA: Diagnosis not present

## 2019-04-01 DIAGNOSIS — E669 Obesity, unspecified: Secondary | ICD-10-CM | POA: Diagnosis present

## 2019-04-01 DIAGNOSIS — R404 Transient alteration of awareness: Secondary | ICD-10-CM | POA: Diagnosis not present

## 2019-04-01 DIAGNOSIS — R000 Tachycardia, unspecified: Secondary | ICD-10-CM

## 2019-04-01 DIAGNOSIS — I4891 Unspecified atrial fibrillation: Secondary | ICD-10-CM | POA: Diagnosis not present

## 2019-04-01 DIAGNOSIS — G4489 Other headache syndrome: Secondary | ICD-10-CM | POA: Diagnosis not present

## 2019-04-01 DIAGNOSIS — E119 Type 2 diabetes mellitus without complications: Secondary | ICD-10-CM | POA: Diagnosis not present

## 2019-04-01 DIAGNOSIS — I4892 Unspecified atrial flutter: Secondary | ICD-10-CM | POA: Diagnosis not present

## 2019-04-01 DIAGNOSIS — E782 Mixed hyperlipidemia: Secondary | ICD-10-CM | POA: Diagnosis not present

## 2019-04-01 DIAGNOSIS — R7989 Other specified abnormal findings of blood chemistry: Secondary | ICD-10-CM | POA: Diagnosis not present

## 2019-04-01 DIAGNOSIS — G8929 Other chronic pain: Secondary | ICD-10-CM | POA: Diagnosis not present

## 2019-04-01 DIAGNOSIS — R0602 Shortness of breath: Secondary | ICD-10-CM

## 2019-04-01 DIAGNOSIS — Z825 Family history of asthma and other chronic lower respiratory diseases: Secondary | ICD-10-CM | POA: Diagnosis not present

## 2019-04-01 DIAGNOSIS — Z981 Arthrodesis status: Secondary | ICD-10-CM

## 2019-04-01 DIAGNOSIS — Z79899 Other long term (current) drug therapy: Secondary | ICD-10-CM

## 2019-04-01 DIAGNOSIS — Z833 Family history of diabetes mellitus: Secondary | ICD-10-CM

## 2019-04-01 DIAGNOSIS — R5381 Other malaise: Secondary | ICD-10-CM

## 2019-04-01 DIAGNOSIS — K219 Gastro-esophageal reflux disease without esophagitis: Secondary | ICD-10-CM | POA: Diagnosis not present

## 2019-04-01 DIAGNOSIS — Z1159 Encounter for screening for other viral diseases: Secondary | ICD-10-CM

## 2019-04-01 DIAGNOSIS — I10 Essential (primary) hypertension: Secondary | ICD-10-CM | POA: Diagnosis not present

## 2019-04-01 DIAGNOSIS — I361 Nonrheumatic tricuspid (valve) insufficiency: Secondary | ICD-10-CM | POA: Diagnosis not present

## 2019-04-01 DIAGNOSIS — Z888 Allergy status to other drugs, medicaments and biological substances status: Secondary | ICD-10-CM | POA: Diagnosis not present

## 2019-04-01 DIAGNOSIS — Z79891 Long term (current) use of opiate analgesic: Secondary | ICD-10-CM

## 2019-04-01 DIAGNOSIS — K760 Fatty (change of) liver, not elsewhere classified: Secondary | ICD-10-CM | POA: Diagnosis not present

## 2019-04-01 DIAGNOSIS — G4733 Obstructive sleep apnea (adult) (pediatric): Secondary | ICD-10-CM | POA: Diagnosis not present

## 2019-04-01 DIAGNOSIS — Z79890 Hormone replacement therapy (postmenopausal): Secondary | ICD-10-CM

## 2019-04-01 LAB — URINALYSIS, ROUTINE W REFLEX MICROSCOPIC
Bilirubin Urine: NEGATIVE
Glucose, UA: NEGATIVE mg/dL
Hgb urine dipstick: NEGATIVE
Ketones, ur: NEGATIVE mg/dL
Leukocytes,Ua: NEGATIVE
Nitrite: NEGATIVE
Protein, ur: NEGATIVE mg/dL
Specific Gravity, Urine: 1.003 — ABNORMAL LOW (ref 1.005–1.030)
pH: 7 (ref 5.0–8.0)

## 2019-04-01 LAB — CBC WITH DIFFERENTIAL/PLATELET
Abs Immature Granulocytes: 0.01 10*3/uL (ref 0.00–0.07)
Basophils Absolute: 0 10*3/uL (ref 0.0–0.1)
Basophils Relative: 1 %
Eosinophils Absolute: 0.2 10*3/uL (ref 0.0–0.5)
Eosinophils Relative: 2 %
HCT: 38.9 % (ref 36.0–46.0)
Hemoglobin: 13.1 g/dL (ref 12.0–15.0)
Immature Granulocytes: 0 %
Lymphocytes Relative: 33 %
Lymphs Abs: 2.1 10*3/uL (ref 0.7–4.0)
MCH: 30.6 pg (ref 26.0–34.0)
MCHC: 33.7 g/dL (ref 30.0–36.0)
MCV: 90.9 fL (ref 80.0–100.0)
Monocytes Absolute: 0.4 10*3/uL (ref 0.1–1.0)
Monocytes Relative: 6 %
Neutro Abs: 3.7 10*3/uL (ref 1.7–7.7)
Neutrophils Relative %: 58 %
Platelets: 242 10*3/uL (ref 150–400)
RBC: 4.28 MIL/uL (ref 3.87–5.11)
RDW: 12.1 % (ref 11.5–15.5)
WBC: 6.3 10*3/uL (ref 4.0–10.5)
nRBC: 0 % (ref 0.0–0.2)

## 2019-04-01 LAB — COMPREHENSIVE METABOLIC PANEL
ALT: 47 U/L — ABNORMAL HIGH (ref 0–44)
AST: 50 U/L — ABNORMAL HIGH (ref 15–41)
Albumin: 3.7 g/dL (ref 3.5–5.0)
Alkaline Phosphatase: 97 U/L (ref 38–126)
Anion gap: 10 (ref 5–15)
BUN: 10 mg/dL (ref 8–23)
CO2: 28 mmol/L (ref 22–32)
Calcium: 8.9 mg/dL (ref 8.9–10.3)
Chloride: 100 mmol/L (ref 98–111)
Creatinine, Ser: 0.76 mg/dL (ref 0.44–1.00)
GFR calc Af Amer: >60 mL/min (ref >60)
GFR calc non Af Amer: >60 mL/min (ref >60)
Glucose, Bld: 121 mg/dL — ABNORMAL HIGH (ref 70–99)
Potassium: 3.8 mmol/L (ref 3.5–5.1)
Sodium: 138 mmol/L (ref 135–145)
Total Bilirubin: 0.3 mg/dL (ref 0.3–1.2)
Total Protein: 6.8 g/dL (ref 6.5–8.1)

## 2019-04-01 LAB — SARS CORONAVIRUS 2 BY RT PCR (HOSPITAL ORDER, PERFORMED IN ~~LOC~~ HOSPITAL LAB): SARS Coronavirus 2: NEGATIVE

## 2019-04-01 LAB — CBG MONITORING, ED: Glucose-Capillary: 125 mg/dL — ABNORMAL HIGH (ref 70–99)

## 2019-04-01 LAB — MAGNESIUM: Magnesium: 1.8 mg/dL (ref 1.7–2.4)

## 2019-04-01 LAB — TROPONIN I (HIGH SENSITIVITY)
Troponin I (High Sensitivity): 8 ng/L (ref <18)
Troponin I (High Sensitivity): 7 ng/L (ref <18)
Troponin I (High Sensitivity): 8 ng/L (ref <18)

## 2019-04-01 LAB — TSH: TSH: 2.352 u[IU]/mL (ref 0.350–4.500)

## 2019-04-01 LAB — T4, FREE: Free T4: 0.95 ng/dL (ref 0.61–1.12)

## 2019-04-01 MED ORDER — SODIUM CHLORIDE 0.9 % IV BOLUS
1000.0000 mL | Freq: Once | INTRAVENOUS | Status: AC
  Administered 2019-04-01: 1000 mL via INTRAVENOUS

## 2019-04-01 MED ORDER — INSULIN ASPART 100 UNIT/ML ~~LOC~~ SOLN
0.0000 [IU] | SUBCUTANEOUS | Status: DC
  Administered 2019-04-02 (×2): 1 [IU] via SUBCUTANEOUS
  Administered 2019-04-02: 3 [IU] via SUBCUTANEOUS
  Administered 2019-04-02 (×2): 1 [IU] via SUBCUTANEOUS
  Administered 2019-04-03 (×2): 2 [IU] via SUBCUTANEOUS
  Administered 2019-04-03: 1 [IU] via SUBCUTANEOUS

## 2019-04-01 MED ORDER — ONDANSETRON HCL 4 MG PO TABS: 4.0000 mg | ORAL_TABLET | Freq: Four times a day (QID) | ORAL | Status: DC | PRN

## 2019-04-01 MED ORDER — MORPHINE SULFATE ER 15 MG PO TBCR
15.0000 mg | EXTENDED_RELEASE_TABLET | Freq: Three times a day (TID) | ORAL | Status: DC
  Administered 2019-04-02 – 2019-04-04 (×8): 15 mg via ORAL
  Filled 2019-04-01 (×8): qty 1

## 2019-04-01 MED ORDER — OXYBUTYNIN CHLORIDE ER 10 MG PO TB24
10.0000 mg | ORAL_TABLET | Freq: Every day | ORAL | Status: DC
  Administered 2019-04-02 – 2019-04-03 (×3): 10 mg via ORAL
  Filled 2019-04-01 (×4): qty 1

## 2019-04-01 MED ORDER — ACETAMINOPHEN 650 MG RE SUPP: 650.0000 mg | Freq: Four times a day (QID) | RECTAL | Status: DC | PRN

## 2019-04-01 MED ORDER — ACETAMINOPHEN 325 MG PO TABS
650.0000 mg | ORAL_TABLET | Freq: Four times a day (QID) | ORAL | Status: DC | PRN
  Administered 2019-04-02: 650 mg via ORAL
  Filled 2019-04-01: qty 2

## 2019-04-01 MED ORDER — METHOCARBAMOL 500 MG PO TABS
500.0000 mg | ORAL_TABLET | Freq: Three times a day (TID) | ORAL | Status: DC
  Administered 2019-04-02 – 2019-04-04 (×8): 500 mg via ORAL
  Filled 2019-04-01 (×8): qty 1

## 2019-04-01 MED ORDER — ONDANSETRON HCL 4 MG/2ML IJ SOLN: 4.0000 mg | Freq: Four times a day (QID) | INTRAMUSCULAR | Status: DC | PRN

## 2019-04-01 MED ORDER — GABAPENTIN 300 MG PO CAPS
300.0000 mg | ORAL_CAPSULE | Freq: Three times a day (TID) | ORAL | Status: DC
  Administered 2019-04-02 – 2019-04-04 (×8): 300 mg via ORAL
  Filled 2019-04-01 (×8): qty 1

## 2019-04-01 MED ORDER — ALUM & MAG HYDROXIDE-SIMETH 200-200-20 MG/5ML PO SUSP
30.0000 mL | Freq: Once | ORAL | Status: AC
  Administered 2019-04-01: 30 mL via ORAL
  Filled 2019-04-01: qty 30

## 2019-04-01 MED ORDER — CITALOPRAM HYDROBROMIDE 20 MG PO TABS
40.0000 mg | ORAL_TABLET | Freq: Every day | ORAL | Status: DC
  Administered 2019-04-02 – 2019-04-04 (×3): 40 mg via ORAL
  Filled 2019-04-01 (×3): qty 2

## 2019-04-01 MED ORDER — DILTIAZEM LOAD VIA INFUSION
20.0000 mg | Freq: Once | INTRAVENOUS | Status: AC
  Administered 2019-04-01: 20 mg via INTRAVENOUS
  Filled 2019-04-01: qty 20

## 2019-04-01 MED ORDER — MIRTAZAPINE 7.5 MG PO TABS
15.0000 mg | ORAL_TABLET | Freq: Every day | ORAL | Status: DC
  Administered 2019-04-02 – 2019-04-03 (×3): 15 mg via ORAL
  Filled 2019-04-01: qty 1
  Filled 2019-04-01 (×2): qty 2

## 2019-04-01 MED ORDER — HYDROCODONE-ACETAMINOPHEN 5-325 MG PO TABS: 1.0000 | ORAL_TABLET | ORAL | Status: DC | PRN

## 2019-04-01 MED ORDER — DILTIAZEM HCL-DEXTROSE 100-5 MG/100ML-% IV SOLN (PREMIX)
5.0000 mg/h | INTRAVENOUS | Status: DC
  Administered 2019-04-01: 5 mg/h via INTRAVENOUS
  Filled 2019-04-01: qty 100

## 2019-04-01 MED ORDER — ASPIRIN EC 81 MG PO TBEC
81.0000 mg | DELAYED_RELEASE_TABLET | Freq: Every day | ORAL | Status: DC
  Administered 2019-04-02: 81 mg via ORAL
  Filled 2019-04-01: qty 1

## 2019-04-01 MED ORDER — PANTOPRAZOLE SODIUM 40 MG PO TBEC
40.0000 mg | DELAYED_RELEASE_TABLET | Freq: Every day | ORAL | Status: DC
  Administered 2019-04-02 – 2019-04-04 (×3): 40 mg via ORAL
  Filled 2019-04-01 (×3): qty 1

## 2019-04-01 MED ORDER — LEVOTHYROXINE SODIUM 50 MCG PO TABS: 50.0000 ug | ORAL_TABLET | Freq: Every day | ORAL | Status: DC

## 2019-04-01 MED ORDER — HEPARIN (PORCINE) 25000 UT/250ML-% IV SOLN
1350.0000 [IU]/h | INTRAVENOUS | Status: DC
  Administered 2019-04-01: 1150 [IU]/h via INTRAVENOUS
  Administered 2019-04-02: 1200 [IU]/h via INTRAVENOUS
  Filled 2019-04-01 (×4): qty 250

## 2019-04-01 MED ORDER — HEPARIN BOLUS VIA INFUSION
4000.0000 [IU] | Freq: Once | INTRAVENOUS | Status: AC
  Administered 2019-04-01: 4000 [IU] via INTRAVENOUS
  Filled 2019-04-01: qty 4000

## 2019-04-01 MED ORDER — MORPHINE SULFATE (PF) 2 MG/ML IV SOLN: 2.0000 mg | INTRAVENOUS | Status: DC | PRN

## 2019-04-01 MED ORDER — SODIUM CHLORIDE 0.9 % IV SOLN
INTRAVENOUS | Status: AC
  Administered 2019-04-02: 01:00:00 via INTRAVENOUS

## 2019-04-01 NOTE — Consult Note (Signed)
Cardiology Consultation:  Patient ID: [name] MRN: [id number]; DOB: 12-23-1950  Admit date: 04/01/2019 Date of Consult: 04/01/2019  Primary Care Provider: Owens Loffler, MD Primary Cardiologist: Glenetta Hew, MD  Patient Profile:  [name] is a 68 y.o. female with a hx of chronic pain, fibromyalgia, sleep-related hypoxia requiring 2 L at night, obesity, nonobstructive CAD, diet-controlled diabetes who is being seen today for the evaluation of atrial flutter/fibrillation at the request of Deno Etienne, MD.  History of Present Illness:  [name] presents with 1 week history of diarrhea, fatigue/weakness.  Associated symptoms include palpitations and chest tightness.  She denies any recent fevers or chills, COVID test is pending.  She also reports just not feeling well over the past week.  She went to a urgent care for evaluation, and was noted to be in atrial flutter and was sent to the emergency room.  Here in the emergency room, she was noted to be in atrial flutter with initial rate around 150.  She was started on IV diltiazem, and appears to still be in atrial flutter versus fibrillation.  She reports no prior history of any arrhythmias.  Labs remarkable for normal high-sensitivity troponin, thyroid studies normal, hemoglobin 13.1, white count 6.3, chest x-ray normal, creatinine 0.76.  She was being started on a heparin drip while I was in the room.  Of note, she had a upper endoscopy in 2017 that was largely unremarkable.  There was no esophageal stricture, however dilation was performed.  She does report that she did not get much benefit from this.  She states overall she is able to eat and drink without limitations.  Heart Pathway Score:       Past Medical History: Past Medical History:  Diagnosis Date  . Allergy   . Anemia   . Arthritis   . B12 deficiency   . Complication of anesthesia    slow to wake up   . Depression   . Diabetes mellitus type 2, diet-controlled  ([place]) 07/24/2018  . Diabetes mellitus without complication ([place])    type II - diet controlled ; A1C 6.5 01/2017  . Fibromyalgia   . Generalized hyperhidrosis 04/17/2014  . GERD (gastroesophageal reflux disease)   . Heart murmur    Aortic Sclerosis  . Hyperlipemia   . HYPOTHYROIDISM 05/29/2007  . Irritable bowel syndrome   . Migraines    hx of   . Oxygen deficiency    2 L at bedtime  . 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 PONV (postoperative nausea and vomiting)   . PVC (premature ventricular contraction)   . Type II or unspecified type diabetes mellitus with neurological manifestations, not stated as uncontrolled(250.60) 04/07/2013  . Unspecified essential hypertension    denies htn on 05/09/16    Past Surgical History: Past Surgical History:  Procedure Laterality Date  . [place] STUDY N/A 01/04/2016   Procedure: [place] STUDY;  Surgeon: Manus Gunning, MD;  Location: [facility];  Service: Gastroenterology;  Laterality: N/A;  . ABDOMINAL HYSTERECTOMY  1988  . ADENOIDECTOMY    . ANTERIOR CERVICAL DECOMP/DISCECTOMY FUSION  2009, 1993   first procedure in ~ 1993/Dr Botero  . APPENDECTOMY  1988  . BACK SURGERY    . CHOLECYSTECTOMY  2005  . ESOPHAGEAL MANOMETRY N/A 01/04/2016   Procedure: ESOPHAGEAL MANOMETRY (EM) 24 HR PH;  Surgeon: Manus Gunning, MD;  Location: [facility];  Service:  Gastroenterology;  Laterality: N/A;  . ESOPHAGOGASTRODUODENOSCOPY (EGD) WITH PROPOFOL N/A 05/17/2016   Procedure: ESOPHAGOGASTRODUODENOSCOPY (EGD) WITH PROPOFOL;  Surgeon: Manus Gunning, MD;  Location: [facility];  Service: Gastroenterology;  Laterality: N/A;  . NASAL SINUS SURGERY  2006  . SAVORY DILATION N/A 05/17/2016   Procedure: SAVORY DILATION;  Surgeon: Manus Gunning, MD;  Location: [facility];  Service: Gastroenterology;  Laterality: N/A;  . TONSILLECTOMY AND ADENOIDECTOMY    . TRANSTHORACIC ECHOCARDIOGRAM  2009    Normal LV size and function. EF 60-65%. No regional wall motion and amount is. Mild aortic sclerosis, no stenosis  . TUBAL LIGATION  1979     Home Medications:  Prior to Admission medications   Medication Sig Start Date End Date Taking? Authorizing Provider  acetaminophen (TYLENOL) 650 MG CR tablet Take 650 mg by mouth 2 (two) times daily.   Yes [provider]  aspirin EC 81 MG tablet Take 81 mg by mouth at bedtime.   Yes [provider]  B Complex Vitamins (B COMPLEX 100 PO) Take 100 mg by mouth daily.    Yes [provider]  bisacodyl (DULCOLAX) 5 MG EC tablet Take 5-10 mg by mouth at bedtime.    Yes [provider]  Calcium Carbonate (CALCIUM 600 PO) Take 600 mg by mouth 2 (two) times daily.    Yes [provider]  calcium carbonate (TUMS - DOSED IN MG ELEMENTAL CALCIUM) 500 MG chewable tablet Chew 1-2 tablets by mouth 3 (three) times daily as needed for indigestion or heartburn.   Yes [provider]  carboxymethylcellulose (REFRESH PLUS) 0.5 % SOLN Place 1 drop into both eyes 3 (three) times daily as needed (dry eyes).    Yes [provider]  cetirizine (ZYRTEC) 10 MG tablet Take 10 mg by mouth at bedtime.    Yes [provider]  Cholecalciferol (VITAMIN D) 125 MCG (5000 UT) CAPS Take 5,000 Units by mouth daily.    Yes [provider]  citalopram (CELEXA) 40 MG tablet TAKE 1 TABLET BY MOUTH  DAILY Patient taking differently: Take 40 mg by mouth daily.  02/12/19  Yes Copland, Frederico Hamman, MD  Coenzyme Q-10 100 MG capsule Take 100 mg by mouth daily.   Yes [provider]  diclofenac sodium (VOLTAREN) 1 % GEL Apply 4 g topically 4 (four) times daily as needed (joint pain).    Yes [provider]  docusate sodium (COLACE) 100 MG capsule Take 200 mg by mouth at bedtime.    Yes [provider]  gabapentin (NEURONTIN) 300 MG capsule TAKE 1 CAPSULE BY MOUTH 3  TIMES DAILY Patient taking  differently: Take 300 mg by mouth 3 (three) times daily.  02/12/19  Yes Bayard Hugger, NP  GRAPE SEED EXTRACT PO Take 1 tablet by mouth daily.    Yes [provider]  levothyroxine (SYNTHROID) 50 MCG tablet TAKE 1 TABLET BY MOUTH  DAILY Patient taking differently: Take 50 mcg by mouth daily before breakfast.  02/12/19  Yes Copland, Frederico Hamman, MD  loratadine (CLARITIN) 10 MG tablet Take 10 mg by mouth daily.   Yes [provider]  MAGNESIUM GLYCINATE PLUS PO Take 800 mg by mouth at bedtime.    Yes [provider]  methocarbamol (ROBAXIN) 500 MG tablet TAKE ONE (1) TABLET BY MOUTH EVERY EIGHTHOURS AS NEEDED FOR MUSCLE SPASMS Patient taking differently: Take 500 mg by mouth 3 (three) times daily.  06/13/18  Yes Bayard Hugger, NP  mirtazapine (REMERON)  15 MG tablet TAKE 1 TABLET BY MOUTH AT  BEDTIME Patient taking differently: Take 15 mg by mouth at bedtime.  02/12/19  Yes Copland, Frederico Hamman, MD  morphine (MS CONTIN) 15 MG 12 hr tablet Take 1 tablet (15 mg total) by mouth 3 (three) times daily. 03/19/19  Yes Bayard Hugger, NP  nitroGLYCERIN (NITROSTAT) 0.4 MG SL tablet Place 1 tablet (0.4 mg total) under the tongue every 5 (five) minutes as needed for chest pain. 06/13/18  Yes [name], MD  oxybutynin (DITROPAN-XL) 10 MG 24 hr tablet Take 1 tablet (10 mg total) by mouth at bedtime. 08/24/18  Yes Copland, Frederico Hamman, MD  RABEprazole (ACIPHEX) 20 MG tablet Take 1 tablet (20 mg total) by mouth daily. 02/13/19  Yes Copland, Frederico Hamman, MD  vitamin C (ASCORBIC ACID) 500 MG tablet Take 500 mg by mouth 2 (two) times daily.   Yes [provider]    Inpatient Medications: Scheduled Meds:  Continuous Infusions: . diltiazem (CARDIZEM) infusion 5 mg/hr (04/01/19 1812)  . heparin 1,150 Units/hr (04/01/19 1851)   PRN Meds:   Allergies:    Allergies  Allergen Reactions  . Rizatriptan Benzoate Anaphylaxis    Throat and Tongue Swelling Closed  . Sumatriptan Other (See  Comments)    Throat closed & Tongue Swelling  . Tizanidine Shortness Of Breath  . Aleve [Naproxen Sodium] Hives  . Clarithromycin Hives  . Cyclobenzaprine Hcl     REACTION: THROAT CLOSED,TOUNGE SWELLING  . Doxycycline Diarrhea and Nausea And Vomiting    Reaction: unknown  . Duloxetine Nausea Only  . Keflex [Cephalexin] Itching    Sores  . Metronidazole Diarrhea and Nausea Only  . Oxycodone Hcl Diarrhea    Vomiting  . Oxycontin [Oxycodone Hcl] Nausea And Vomiting  . Penicillins     Reaction: unknown Has patient had a PCN reaction causing immediate rash, facial/tongue/throat swelling, SOB or lightheadedness with hypotension: {unknown Has patient had a PCN reaction causing severe rash involving mucus membranes or skin necrosis: {unknown Has patient had a PCN reaction that required hospitalization {unknown Has patient had a PCN reaction occurring within the last 10 years: {no If all of the above answers are "NO", then may proceed with Cephalosporin use.  . Rosuvastatin     REACTION: MUSCLE ACHES  . Septra [Sulfamethoxazole-Trimethoprim] Other (See Comments)    Rash and itching  . Venlafaxine Hives  . Voltaren [Diclofenac Sodium] Hives    oral  . Tramadol Diarrhea, Nausea And Vomiting and Palpitations    Social History:   Social History   Socioeconomic History  . Marital status: Married    Spouse name: Not on file  . Number of children: Not on file  . Years of education: Not on file  . Highest education level: Not on file  Occupational History  . Occupation: Radiation protection practitioner  . Occupation: retired  Scientific laboratory technician  . Financial resource strain: Not hard at all  . Food insecurity    Worry: Never true    Inability: Never true  . Transportation needs    Medical: No    Non-medical: No  Tobacco Use  . Smoking status: Never Smoker  . Smokeless tobacco: Never Used  Substance and Sexual Activity  . Alcohol use: Not Currently    Alcohol/week: 0.0 standard drinks    Comment: rare   . Drug use: Yes    Types: Morphine  . Sexual activity: Not Currently  Lifestyle  . Physical activity    Days per week: 0 days  Minutes per session: 0 min  . Stress: Not at all  Relationships  . Social Herbalist on phone: Not on file    Gets together: Not on file    Attends religious service: Not on file    Active member of club or organization: Not on file    Attends meetings of clubs or organizations: Not on file    Relationship status: Not on file  . Intimate partner violence    Fear of current or ex partner: No    Emotionally abused: No    Physically abused: No    Forced sexual activity: No  Other Topics Concern  . Not on file  Social History Narrative  . Not on file     Family History:    Family History  Problem Relation Age of Onset  . Breast cancer Mother   . Diabetes Mother   . Heart disease Mother   . Allergies Mother   . Diabetes Father   . Heart disease Father   . Emphysema Father   . Allergies Father   . Stomach cancer Maternal Uncle   . Breast cancer Paternal Aunt   . Colon cancer Neg Hx     ROS:  All other ROS reviewed and negative. Pertinent positives noted in the HPI.     Physical Exam/Data:   Vitals:   04/01/19 1700 04/01/19 1730 04/01/19 1830 04/01/19 1845  BP: 124/65 136/74 138/66 126/65  Pulse: (!) 145 (!) 147 (!) 112 72  Resp: 19 18 16 13   Temp:      TempSrc:      SpO2: 100% 100% 100% 97%  Weight:      Height:         Intake/Output Summary (Last 24 hours) at 04/01/2019 1903 Last data filed at 04/01/2019 1710 Gross per 24 hour  Intake 1000.11 ml  Output -  Net 1000.11 ml    Last 3 Weights 04/01/2019 03/19/2019 02/19/2019  Weight (lbs) 224 lb 223 lb 225 lb  Weight (kg) 101.606 kg 101.152 kg 102.059 kg    Body mass index is 37.28 kg/m.  General: Well nourished, well developed, in no acute distress Head: Atraumatic, normal size  Eyes: PEERLA, EOMI  Neck: Supple, no JVD Endocrine: No thryomegaly Cardiac: Irregular  rhythm, no murmurs rubs or gallops Lungs: Clear to auscultation bilaterally, no wheezing, rhonchi or rales  Abd: Soft, nontender, no hepatomegaly  Ext: No edema, pulses 2+ Musculoskeletal: No deformities, BUE and BLE strength normal and equal Skin: Warm and dry, no rashes   Neuro: Alert and oriented to person, place, time, and situation, CNII-XII grossly intact, no focal deficits  Psych: Normal mood and affect   EKG:  The EKG was personally reviewed and demonstrates: Initial EKG with atrial flutter with heart rate 148, subsequent EKG with predominant flutter waves and heart rate 91 Telemetry:  Telemetry was personally reviewed and demonstrates: Atrial flutter with heart rate in the 70-90 range  Relevant CV Studies: CCTA (07/11/2018) 1. Coronary artery calcium score 63 [place], placing the patient in the 74th percentile for age and gender. This suggests intermediate risk for future cardiac events. 2.  Nonobstructive coronary disease.  Laboratory Data: High Sensitivity Troponin:   Recent Labs  Lab 04/01/19 1605  TROPONINIHS 8     Cardiac EnzymesNo results for input(s): TROPONINI in the last 168 hours. No results for input(s): TROPIPOC in the last 168 hours.  Chemistry Recent Labs  Lab 04/01/19 1605  NA 138  K  3.8  CL 100  CO2 28  GLUCOSE 121*  BUN 10  CREATININE 0.76  CALCIUM 8.9  GFRNONAA >60  GFRAA >60  ANIONGAP 10    Recent Labs  Lab 04/01/19 1605  PROT 6.8  ALBUMIN 3.7  AST 50*  ALT 47*  ALKPHOS 97  BILITOT 0.3   Hematology Recent Labs  Lab 04/01/19 1605  WBC 6.3  RBC 4.28  HGB 13.1  HCT 38.9  MCV 90.9  MCH 30.6  MCHC 33.7  RDW 12.1  PLT 242   BNPNo results for input(s): BNP, PROBNP in the last 168 hours.  DDimer No results for input(s): DDIMER in the last 168 hours.  Radiology/Studies:  Dg Chest Port 1 View  Result Date: 04/01/2019 CLINICAL DATA:  Weakness. EXAM: PORTABLE CHEST 1 VIEW COMPARISON:  Radiographs of June 27, 2009. FINDINGS:  The heart size and mediastinal contours are within normal limits. Both lungs are clear. The visualized skeletal structures are unremarkable. IMPRESSION: No active disease. Electronically Signed   By: Marijo Conception M.D.   On: 04/01/2019 16:19    Assessment and Plan:  1. Atrial flutter with variable block on diltiazem drip -We will recommend admission to medicine service giving that she needs work-up for nausea vomiting, and diarrhea. -Please insert she has a coronavirus test that is completed -This is new onset atrial flutter and her thyroid studies are normal.  She does not appear to be in acute decompensated heart failure.  She has risk factors for arrhythmias including sleep-related hypoxia, obesity, and diet-controlled diabetes. -Please continue diltiazem drip for rate control -Please initiate a high intensity heparin drip and maintain therapeutic status on this drip -Keep n.p.o. at midnight for TEE cardioversion -She appears to be able to swallow without major limitations, and has had a recent EGD in 2017 without obvious stricture.  I think it would be okay to proceed with TEE cardioversion tomorrow -We will also have EP evaluate her for possible flutter ablation -CHADSVASC= 3; likely will merit long-term anticoagulation but will need at least 4 weeks post cardioversion    For questions or updates, please contact [facility] Please consult [email or web address] for contact info under   Signed, Lake Bells T. Audie Box, [place]  04/01/2019 7:03 PM

## 2019-04-01 NOTE — ED Provider Notes (Signed)
[facility]    CSN: [id number] Arrival date & time: 04/01/19  1247      History   Chief Complaint Chief Complaint  Patient presents with  . Diarrhea    HPI [name] is a 68 y.o. female with history of type 2 diabetes (diet-controlled), hyperhidrosis, anemia, fibromyalgia (45 mg MEDD daily), nocturnal oxygen dependency, PVC presenting for week long course of fatigue.  Patient notes having diarrhea last week, now resolved, and has been experiencing extreme fatigue, intermittent frontal headaches, sinus pressure, increased sweating from her baseline, lightheadedness, dizziness.  Patient states she will get dizzy or lightheaded when she goes from sitting to standing: Denies fall, LOC.  Patient is not take anything for this, nor has she been evaluated for this.  No known COVID contacts, fever, cough, shortness of breath chest pain, abdominal pain.  Past Medical History:  Diagnosis Date  . Allergy   . Anemia   . Arthritis   . B12 deficiency   . Complication of anesthesia    slow to wake up   . Depression   . Diabetes mellitus type 2, diet-controlled ([place]) 07/24/2018  . Diabetes mellitus without complication ([place])    type II - diet controlled ; A1C 6.5 01/2017  . Fibromyalgia   . Generalized hyperhidrosis 04/17/2014  . GERD (gastroesophageal reflux disease)   . Heart murmur    Aortic Sclerosis  . Hyperlipemia   . HYPOTHYROIDISM 05/29/2007  . Irritable bowel syndrome   . Migraines    hx of   . Oxygen deficiency    2 L at bedtime  . 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 PONV (postoperative nausea and vomiting)   . PVC (premature ventricular contraction)   . Type II or unspecified type diabetes mellitus with neurological manifestations, not stated as uncontrolled(250.60) 04/07/2013  . Unspecified essential hypertension    denies htn on 05/09/16    Patient Active Problem List   Diagnosis Date Noted  . Diabetes mellitus type 2,  diet-controlled ([place]) 07/24/2018  . Status post bunionectomy 04/24/2018  . Postoperative wound infection 04/24/2018  . Lipoma of buttock 01/10/2017  . Chronic respiratory failure with hypoxia ([place]) 04/07/2015  . Obesity hypoventilation syndrome ([place]) 03/26/2015  . Generalized hyperhidrosis 04/17/2014  . Obesity (BMI 30-39.9) 08/23/2013  . 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 Postlaminectomy syndrome, cervical region 08/29/2011  . Dysphagia 07/14/2010  . TRANSAMINASES, SERUM, ELEVATED 12/24/2008  . Depression, major, recurrent, in partial remission ([name]) 09/22/2008  . ALLERGIC RHINITIS 09/22/2008  . Hypothyroidism 05/29/2007  . HYPERLIPIDEMIA 05/29/2007  . Vitamin D deficiency 01/11/2007  . FIBROMYALGIA 01/11/2007  . Vitamin B 12 deficiency 11/13/2006  . Essential hypertension 11/13/2006  . GERD 11/13/2006    Past Surgical History:  Procedure Laterality Date  . [place] STUDY N/A 01/04/2016   Procedure: [place] STUDY;  Surgeon: Manus Gunning, MD;  Location: [facility];  Service: Gastroenterology;  Laterality: N/A;  . ABDOMINAL HYSTERECTOMY  1988  . ADENOIDECTOMY    . ANTERIOR CERVICAL DECOMP/DISCECTOMY FUSION  2009, 1993   first procedure in ~ 1993/Dr Botero  . APPENDECTOMY  1988  . BACK SURGERY    . CHOLECYSTECTOMY  2005  . ESOPHAGEAL MANOMETRY N/A 01/04/2016   Procedure: ESOPHAGEAL MANOMETRY (EM) 24 HR PH;  Surgeon: Manus Gunning, MD;  Location: [facility];  Service: Gastroenterology;  Laterality: N/A;  . ESOPHAGOGASTRODUODENOSCOPY (EGD) WITH PROPOFOL  N/A 05/17/2016   Procedure: ESOPHAGOGASTRODUODENOSCOPY (EGD) WITH PROPOFOL;  Surgeon: Manus Gunning, MD;  Location: [facility];  Service: Gastroenterology;  Laterality: N/A;  . NASAL SINUS SURGERY  2006  . SAVORY DILATION N/A 05/17/2016   Procedure: SAVORY DILATION;  Surgeon: Manus Gunning, MD;  Location: [facility];  Service: Gastroenterology;   Laterality: N/A;  . TONSILLECTOMY AND ADENOIDECTOMY    . TRANSTHORACIC ECHOCARDIOGRAM  2009   Normal LV size and function. EF 60-65%. No regional wall motion and amount is. Mild aortic sclerosis, no stenosis  . TUBAL LIGATION  1979    OB History   No obstetric history on file.      Home Medications    Prior to Admission medications   Medication Sig Start Date End Date Taking? Authorizing Provider  acetaminophen (TYLENOL) 650 MG CR tablet Take 650 mg by mouth 2 (two) times daily.    [provider]  aspirin EC 81 MG tablet Take 81 mg by mouth at bedtime.    [provider]  B Complex Vitamins (B COMPLEX 100 PO) Take 100 mg by mouth daily.     [provider]  bisacodyl (DULCOLAX) 5 MG EC tablet Take 5-10 mg by mouth at bedtime.     [provider]  Calcium Carbonate (CALCIUM 600 PO) Take 600 mg by mouth 2 (two) times daily.     [provider]  calcium carbonate (TUMS - DOSED IN MG ELEMENTAL CALCIUM) 500 MG chewable tablet Chew 1-2 tablets by mouth 3 (three) times daily as needed for indigestion or heartburn.    [provider]  carboxymethylcellulose (REFRESH PLUS) 0.5 % SOLN Place 1 drop into both eyes 3 (three) times daily as needed (dry eyes).     [provider]  cetirizine (ZYRTEC) 10 MG tablet Take 10 mg by mouth at bedtime.     [provider]  Cholecalciferol (VITAMIN D) 125 MCG (5000 UT) CAPS Take 5,000 Units by mouth daily.     [provider]  citalopram (CELEXA) 40 MG tablet TAKE 1 TABLET BY MOUTH  DAILY Patient taking differently: Take 40 mg by mouth daily.  02/12/19   Copland, Frederico Hamman, MD  Coenzyme Q-10 100 MG capsule Take 100 mg by mouth daily.    [provider]  diclofenac sodium (VOLTAREN) 1 % GEL Apply 4 g topically 4 (four) times daily as needed (joint pain).     [provider]  docusate sodium (COLACE) 100 MG capsule Take 200 mg by mouth at bedtime.     [provider]  gabapentin (NEURONTIN) 300 MG capsule TAKE 1 CAPSULE BY MOUTH 3  TIMES DAILY Patient taking differently: Take 300 mg by mouth 3 (three) times daily.  02/12/19   Bayard Hugger, NP  GRAPE SEED EXTRACT PO Take 1 tablet by mouth daily.     [provider]  levothyroxine (SYNTHROID) 50 MCG tablet TAKE 1 TABLET BY MOUTH  DAILY Patient taking differently: Take 50 mcg by mouth daily before breakfast.  02/12/19   Copland, Frederico Hamman, MD  loratadine (CLARITIN) 10 MG tablet Take 10 mg by mouth daily.    [provider]  MAGNESIUM GLYCINATE PLUS PO Take 800 mg by mouth at bedtime.     [provider]  methocarbamol (ROBAXIN) 500 MG tablet TAKE ONE (1) TABLET BY MOUTH EVERY EIGHTHOURS AS NEEDED FOR MUSCLE SPASMS Patient taking differently: Take 500 mg by mouth 3 (three) times daily.  06/13/18   [name]  L, NP  mirtazapine (REMERON) 15 MG tablet TAKE 1 TABLET BY MOUTH AT  BEDTIME Patient taking differently: Take 15 mg by mouth at bedtime.  02/12/19   Copland, Frederico Hamman, MD  morphine (MS CONTIN) 15 MG 12 hr tablet Take 1 tablet (15 mg total) by mouth 3 (three) times daily. 03/19/19   Bayard Hugger, NP  nitroGLYCERIN (NITROSTAT) 0.4 MG SL tablet Place 1 tablet (0.4 mg total) under the tongue every 5 (five) minutes as needed for chest pain. 06/13/18   [name], MD  oxybutynin (DITROPAN-XL) 10 MG 24 hr tablet Take 1 tablet (10 mg total) by mouth at bedtime. 08/24/18   Copland, Frederico Hamman, MD  RABEprazole (ACIPHEX) 20 MG tablet Take 1 tablet (20 mg total) by mouth daily. 02/13/19   Copland, Frederico Hamman, MD  vitamin C (ASCORBIC ACID) 500 MG tablet Take 500 mg by mouth 2 (two) times daily.    [provider]    Family History Family History  Problem Relation Age of Onset  . Breast cancer Mother   . Diabetes Mother   . Heart disease Mother   . Allergies Mother   . Diabetes Father   . Heart disease Father   . Emphysema Father   . Allergies Father   .  Stomach cancer Maternal Uncle   . Breast cancer Paternal Aunt   . Colon cancer Neg Hx     Social History Social History   Tobacco Use  . Smoking status: Never Smoker  . Smokeless tobacco: Never Used  Substance Use Topics  . Alcohol use: Not Currently    Alcohol/week: 0.0 standard drinks    Comment: rare  . Drug use: Yes    Types: Morphine     Allergies   Rizatriptan benzoate, Sumatriptan, Tizanidine, Aleve [naproxen sodium], Clarithromycin, Cyclobenzaprine hcl, Doxycycline, Duloxetine, Keflex [cephalexin], Metronidazole, Oxycodone hcl, Oxycontin [oxycodone hcl], Penicillins, Rosuvastatin, Septra [sulfamethoxazole-trimethoprim], Venlafaxine, Voltaren [diclofenac sodium], and Tramadol   Review of Systems Review of Systems  Constitutional: Positive for fatigue. Negative for fever.  HENT: Negative for ear pain, sinus pain, sore throat and voice change.   Eyes: Negative for pain, redness and visual disturbance.  Respiratory: Negative for cough and shortness of breath.   Cardiovascular: Negative for chest pain and palpitations.  Gastrointestinal: Negative for abdominal pain, diarrhea, nausea and vomiting.  Musculoskeletal: Negative for arthralgias and myalgias.  Skin: Negative for rash and wound.  Neurological: Positive for weakness, light-headedness and headaches. Negative for syncope.     Physical Exam Triage Vital Signs ED Triage Vitals  Enc Vitals Group     BP 04/01/19 1326 119/70     Pulse Rate 04/01/19 1326 95     Resp 04/01/19 1326 16     Temp 04/01/19 1326 99 F (37.2 C)     Temp Source 04/01/19 1326 Oral     SpO2 04/01/19 1326 98 %     Weight --      Height --      Head Circumference --      Peak Flow --      Pain Score 04/01/19 1334 6     Pain Loc --      Pain Edu? --      Excl. in [place]? --    No data found.  Updated Vital Signs BP 116/70 (BP Location: Left Arm)   Pulse 95   Temp 99 F (37.2 C) (Oral)   Resp 18   SpO2 96%   Visual Acuity Right  Eye Distance:  Left Eye Distance:   Bilateral Distance:    Right Eye Near:   Left Eye Near:    Bilateral Near:     Physical Exam Constitutional:      General: She is not in acute distress.    Appearance: She is obese.     Comments: Appears fatigued, nontoxic  HENT:     Head: Normocephalic and atraumatic.     Nose: Nose normal.     Mouth/Throat:     Mouth: Mucous membranes are moist.     Pharynx: Oropharynx is clear. No oropharyngeal exudate or posterior oropharyngeal erythema.  Eyes:     General: No scleral icterus.    Conjunctiva/sclera: Conjunctivae normal.     Pupils: Pupils are equal, round, and reactive to light.  Neck:     Musculoskeletal: Neck supple. No muscular tenderness.  Cardiovascular:     Rate and Rhythm: Rhythm irregular.     Heart sounds: No murmur. No gallop.      Comments: Irregularly irregular rhythm with tachycardic rate.  Radial pulse approximately 90 bpm, though heart rate per auscultation greater than 140.  Heart sounds via auscultation do not coincide with radial pulse Pulmonary:     Effort: Pulmonary effort is normal. No respiratory distress.     Breath sounds: No wheezing or rales.  Abdominal:     General: Bowel sounds are normal.     Tenderness: There is no abdominal tenderness.  Lymphadenopathy:     Cervical: No cervical adenopathy.  Skin:    Capillary Refill: Capillary refill takes less than 2 seconds.     Coloration: Skin is pale. Skin is not jaundiced.  Neurological:     General: No focal deficit present.     Mental Status: She is alert and oriented to person, place, and time.      UC Treatments / Results  Labs (all labs ordered are listed, but only abnormal results are displayed) Labs Reviewed  NOVEL CORONAVIRUS, NAA    EKG   Radiology  Procedures Procedures (including critical care time)  Medications Ordered in UC Medications - No data to display  Initial Impression / Assessment and Plan / UC Course  I have reviewed  the triage vital signs and the nursing notes.  Pertinent labs & imaging results that were available during my care of the patient were reviewed by me and considered in my medical decision making (see chart for details).    1. Tachycardia 68 year old female with history of PVCs, type 2 diabetes, hypothyroidism, fibromyalgia on 45 mg MEDD presenting for greater than 1 week course of diarrhea, malaise, fatigue, increased chest discomfort and shortness of breath.  EKG done in office, reviewed by me and compared to previous from December 2019: Atrial flutter with variable AV block.  Ventricular rate of 141; HR varies between 90-156 while in office.  Given patient's symptoms, pallor, likely atrial flutter on EKG she was transferred to ER via EMS for further eval/mgmt. Final Clinical Impressions(s) / UC Diagnoses   Final diagnoses:  Suspected COVID-19 virus infection     Discharge Instructions     68 year old female with history of PVCs, type 2 diabetes, hypothyroidism, fibromyalgia on 45 mg MEDD presenting for greater than 1 week course of diarrhea, malaise, fatigue, increased chest discomfort and shortness of breath.  EKG done in office, reviewed by me and compared to previous from December 2019: Atrial flutter with variable AV block.  Ventricular rate of 141, the varies between 120-156 while in office.  Given patient's  symptoms, pallor, atrial flutter she was transferred to ER via EMS further evaluation/monitoring/treatment.    ED Prescriptions    None     PDMP not reviewed this encounter.   Neldon Mc [place], [place] 04/01/19 1934

## 2019-04-01 NOTE — ED Triage Notes (Signed)
Pt states called her PCP today and was encouraged to come here for a chest x-ray and a face to face. Pt states since 03/23/19, she has had diarrhea, extreme fatigue, headache, sinus pressure, profuse sweating, light-headedness, dizziness, and ears stuffy. Pt denies cough.

## 2019-04-01 NOTE — ED Triage Notes (Signed)
Pt from urgent care via ems for further evaluation dizziness and tachycardia (150s, sinus tach, occ PAC's); endorses central CP, non radiating, pressure, sharp at times (pt endorses hx GERD, feels similar); constant sweating x 1 week, skin cool to touch; denies  N/V today; pt endorses significant diarrhea since Saturday before last; since then, pt has been feeling fatigue, dizziness, n/v; pt given covid test at urgent care; denies sick contacts, denies fevers; pt also says she has a HA, "I feel like iv'e got cotton in my ears"  97 F HR 120-150 500 ns pta 148/90 98%v RA CBG 126, hx DM

## 2019-04-01 NOTE — ED Notes (Signed)
EMS notified for transport to ED for further evaluation

## 2019-04-01 NOTE — Progress Notes (Signed)
ANTICOAGULATION CONSULT NOTE - Initial Consult  Pharmacy Consult for Heparin Indication: atrial fibrillation  Allergies  Allergen Reactions  . Rizatriptan Benzoate Anaphylaxis    Throat and Tongue Swelling Closed  . Sumatriptan Other (See Comments)    Throat closed & Tongue Swelling  . Tizanidine Shortness Of Breath  . Aleve [Naproxen Sodium] Hives  . Clarithromycin Hives  . Cyclobenzaprine Hcl     REACTION: THROAT CLOSED,TOUNGE SWELLING  . Doxycycline Diarrhea and Nausea And Vomiting    Reaction: unknown  . Duloxetine Nausea Only  . Keflex [Cephalexin] Itching    Sores  . Metronidazole Diarrhea and Nausea Only  . Oxycodone Hcl Diarrhea    Vomiting  . Oxycontin [Oxycodone Hcl] Nausea And Vomiting  . Penicillins     Reaction: unknown Has patient had a PCN reaction causing immediate rash, facial/tongue/throat swelling, SOB or lightheadedness with hypotension: {unknown Has patient had a PCN reaction causing severe rash involving mucus membranes or skin necrosis: {unknown Has patient had a PCN reaction that required hospitalization {unknown Has patient had a PCN reaction occurring within the last 10 years: {no If all of the above answers are "NO", then may proceed with Cephalosporin use.  . Rosuvastatin     REACTION: MUSCLE ACHES  . Septra [Sulfamethoxazole-Trimethoprim] Other (See Comments)    Rash and itching  . Venlafaxine Hives  . Voltaren [Diclofenac Sodium] Hives    oral  . Tramadol Diarrhea, Nausea And Vomiting and Palpitations    Patient Measurements: Height: 5\' 5"  (165.1 cm) Weight: 224 lb (101.6 kg) IBW/kg (Calculated) : 57 Heparin Dosing Weight: 80.4  Vital Signs: Temp: 98.7 F (37.1 C) (10/05 1608) Temp Source: Oral (10/05 1608) BP: 124/65 (10/05 1700) Pulse Rate: 145 (10/05 1700)  Labs: Recent Labs    04/01/19 1605  HGB 13.1  HCT 38.9  PLT 242  CREATININE 0.76  TROPONINIHS 8    Estimated Creatinine Clearance: 80.6 mL/min (by C-G formula based  on SCr of 0.76 mg/dL).   Medical History: Past Medical History:  Diagnosis Date  . Allergy   . Anemia   . Arthritis   . B12 deficiency   . Complication of anesthesia    slow to wake up   . Depression   . Diabetes mellitus type 2, diet-controlled ([place]) 07/24/2018  . Diabetes mellitus without complication ([place])    type II - diet controlled ; A1C 6.5 01/2017  . Fibromyalgia   . Generalized hyperhidrosis 04/17/2014  . GERD (gastroesophageal reflux disease)   . Heart murmur    Aortic Sclerosis  . Hyperlipemia   . HYPOTHYROIDISM 05/29/2007  . Irritable bowel syndrome   . Migraines    hx of   . Oxygen deficiency    2 L at bedtime  . 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 PONV (postoperative nausea and vomiting)   . PVC (premature ventricular contraction)   . Type II or unspecified type diabetes mellitus with neurological manifestations, not stated as uncontrolled(250.60) 04/07/2013  . Unspecified essential hypertension    denies htn on 05/09/16    Assessment: 68 yo F with a chief complaints of feeling weak. She called her family physician who told her to go to urgent care.  Upon arrival there it was noted that her heart rate was in the 150s and she was sent here for evaluation. Pharmacy consulted to start heparin for atrial fibrillation.  Patient was not on anticoagulation PTA.  Goal of Therapy:  Heparin level 0.3-0.7 units/ml Monitor platelets by anticoagulation protocol: Yes   Plan:  Give 4000 units bolus x 1 Start heparin infusion at 1150 units/hr Check anti-Xa level in 6-8 hours and daily while on heparin Continue to monitor H&H and platelets  Alanda Slim, PharmD, [facility] Clinical Pharmacist Please see AMION for all Pharmacists' Contact Phone Numbers 04/01/2019, 5:56 PM

## 2019-04-01 NOTE — H&P (Signed)
[name] [id number] DOB: 26-May-1951 DOA: 04/01/2019     PCP: Owens Loffler, MD   Outpatient Specialists:   CARDS:  Dr. Ellyn Hack   GI Dr.Paul Armbruster  Pain Kirksten   Patient arrived to ER on 04/01/19 at 1539  Patient coming from: home Lives   With family    Chief Complaint:   Chief Complaint  Patient presents with   Dizziness   Tachycardia    HPI: [name] is a 68 y.o. female with medical history significant of chronic pain, fibromyalgia, sleep-related hypoxia requiring 2 L at night, obesity, nonobstructive CAD, diet-controlled diabetes    Presented with 9 days history of fatigue, lighthead after experiencing diarrhea 1 weeks ago lasted 2 days since then  she has been feeling some palpitations, headache,  and chest tightness she presented to her urgent care today and was found to be tachycardic into the 150s and was sent to emergency department where she was found to be in a flutter with initial rates of 150s started on IV diltiazem after administration of a bolus cardiology was consulted. Patient has no prior history of this denies any history of bleeding not apparent on anticoagulation Denies any fevers or chills no cough no shortness of breath.  no sick contacts No COVID exposure have been isolating at home She reports severe chest pain in the middle of her chest with some belching and burping  She has had some discomfort while in ER not rate dependent not worse with inspiration It feels like aching dull pain non positional   No recent change in medications  Infectious risk factors:  Reports chest tightness In  ER RAPID COVID TEST Negative  Lab Results  Component Value Date   [place] NEGATIVE 04/01/2019     Regarding pertinent Chronic problems:   The for esophageal  stricture status post dilatation   Hyperlipidemia -does not tolerate  statins   NON -obstructive CAD  - On Aspirin                  DM 2 -  Lab Results  Component  Value Date   HGBA1C 7.1 (H) 01/18/2019   diet controlled   Hypothyroidism:  Lab Results  Component Value Date   TSH 2.352 04/01/2019   on synthroid    obesity-   BMI Readings from Last 1 Encounters:  04/01/19 37.28 kg/m      OSA -on nocturnal oxygen, 2L   While in ER: Started on diltiazem drip heart rate went down to high 90s low 100s Started on heparin drip Cardiology has been consulted will see in the emergency department Troponin unremarkable  The following Work up has been ordered so far:  Orders Placed This Encounter  Procedures   SARS Coronavirus 2 [facility] order, Performed in [place] hospital lab) Nasopharyngeal Nasopharyngeal Swab   DG Chest Port 1 View   CBC with Differential   Comprehensive metabolic panel   Urinalysis, Routine w reflex microscopic   TSH   T4, free   Magnesium   Heparin level (unfractionated)   Heparin level (unfractionated)   CBC   Diet NPO time specified Except for: Sips with Meds   heparin per pharmacy consult   Inpatient consult to Cardiology  ALL PATIENTS BEING ADMITTED/HAVING PROCEDURES NEED COVID-19 SCREENING   Consult to hospitalist  ALL PATIENTS BEING ADMITTED/HAVING PROCEDURES NEED COVID-19 SCREENING   CBG monitoring, ED   EKG 12-Lead   EKG 12-Lead   EKG 12-Lead  Following Medications were ordered in ER: Medications  diltiazem (CARDIZEM) 1 mg/mL load via infusion 20 mg (20 mg Intravenous Bolus from Bag 04/01/19 1813)    And  diltiazem (CARDIZEM) 100 mg in dextrose 5% 1103mL (1 mg/mL) infusion (5 mg/hr Intravenous New Bag/Given 04/01/19 1812)  heparin ADULT infusion 100 units/mL (25000 units/287mL sodium chloride 0.45%) (1,150 Units/hr Intravenous New Bag/Given 04/01/19 1851)  sodium chloride 0.9 % bolus 1,000 mL (0 mLs Intravenous Stopped 04/01/19 1710)  sodium chloride 0.9 % bolus 1,000 mL (1,000 mLs Intravenous New Bag/Given 04/01/19 1731)  heparin bolus via infusion 4,000 Units (4,000 Units  Intravenous Bolus from Bag 04/01/19 1852)        Consult Orders  (From admission, onward)         Start     Ordered   04/01/19 1841  Consult to hospitalist  ALL PATIENTS BEING ADMITTED/HAVING PROCEDURES NEED COVID-19 SCREENING  Once    Comments: ALL PATIENTS BEING ADMITTED/HAVING PROCEDURES NEED COVID-19 SCREENING  Provider:  (Not yet assigned)  Question Answer Comment  Place call to: [facility]   Reason for Consult Admit      04/01/19 1840          ER Provider Called: Cardiology   Dr. Evalee Jefferson They Recommend admit to medicine for TEE conversion in a.m. Will see    in ER   Significant initial  Findings: Abnormal Labs Reviewed  COMPREHENSIVE METABOLIC PANEL - Abnormal; Notable for the following components:      Result Value   Glucose, Bld 121 (*)    AST 50 (*)    ALT 47 (*)    All other components within normal limits  URINALYSIS, ROUTINE W REFLEX MICROSCOPIC - Abnormal; Notable for the following components:   Specific Gravity, Urine 1.003 (*)    All other components within normal limits  CBG MONITORING, ED - Abnormal; Notable for the following components:   Glucose-Capillary 125 (*)    All other components within normal limits     Otherwise labs showing:    Recent Labs  Lab 04/01/19 1605  NA 138  K 3.8  CO2 28  GLUCOSE 121*  BUN 10  CREATININE 0.76  CALCIUM 8.9  MG 1.8    Cr  stable,    Lab Results  Component Value Date   CREATININE 0.76 04/01/2019   CREATININE 0.69 01/18/2019   CREATININE 0.64 07/09/2018    Recent Labs  Lab 04/01/19 1605  AST 50*  ALT 47*  ALKPHOS 97  BILITOT 0.3  PROT 6.8  ALBUMIN 3.7   Lab Results  Component Value Date   CALCIUM 8.9 04/01/2019       WBC       Component Value Date/Time   WBC 6.3 04/01/2019 1605   ANC    Component Value Date/Time   NEUTROABS 3.7 04/01/2019 1605   NEUTROABS 3.7 03/30/2018 1114   ALC No components found for: LYMPHAB    Plt: Lab Results  Component Value  Date   PLT 242 04/01/2019    Lactic Acid, Venous No results found for: LATICACIDVEN     COVID-19 Labs  No results for input(s): DDIMER, FERRITIN, LDH, CRP in the last 72 hours.  Lab Results  Component Value Date   SARSCOV2NAA NEGATIVE 04/01/2019     HG/HCT     Stable     Component Value Date/Time   HGB 13.1 04/01/2019 1605   HGB 12.7 03/30/2018 1114   HCT 38.9 04/01/2019 1605   HCT 36.6  03/30/2018 1114     Troponin 8    ECG: Ordered Personally reviewed by me showing: HR : 91 Rhythm:   A.flutter.     no evidence of ischemic changes QTC 446   DM  labs:  HbA1C: Recent Labs    07/25/18 1455 01/18/19 1407  HGBA1C 6.5* 7.1*        CBG (last 3)  Recent Labs    04/01/19 1550  GLUCAP 125*     UA   no evidence of UTI    Urine analysis:    Component Value Date/Time   COLORURINE YELLOW 04/01/2019 1647   APPEARANCEUR CLEAR 04/01/2019 1647   LABSPEC 1.003 (L) 04/01/2019 1647   PHURINE 7.0 04/01/2019 1647   GLUCOSEU NEGATIVE 04/01/2019 1647   HGBUR NEGATIVE 04/01/2019 1647   BILIRUBINUR NEGATIVE 04/01/2019 [place] 04/01/2019 1647   PROTEINUR NEGATIVE 04/01/2019 1647   NITRITE NEGATIVE 04/01/2019 1647   LEUKOCYTESUR NEGATIVE 04/01/2019 1647       Ordered   CXR -   NON acute   ED Triage Vitals  Enc Vitals Group     BP 04/01/19 1603 (!) 149/59     Pulse Rate 04/01/19 1603 (!) 149     Resp 04/01/19 1603 12     Temp 04/01/19 1608 98.7 F (37.1 C)     Temp Source 04/01/19 1608 Oral     SpO2 04/01/19 1603 99 %     Weight 04/01/19 1551 224 lb (101.6 kg)     Height 04/01/19 1551 5\' 5"  (1.651 m)     Head Circumference --      Peak Flow --      Pain Score 04/01/19 1551 2     Pain Loc --      Pain Edu? --      Excl. in [place]? --   TMAX(24)@       Latest  Blood pressure 126/65, pulse 72, temperature 98.7 F (37.1 C), temperature source Oral, resp. rate 13, height 5\' 5"  (1.651 m), weight 101.6 kg, SpO2 97 %.    Hospitalist was called  for admission for  A.flutter   Review of Systems:    Pertinent positives include:fatigue,  diarrhea  Constitutional:  No weight loss, night sweats, Fevers, chills, weight loss  HEENT:  No headaches, Difficulty swallowing,Tooth/dental problems,Sore throat,  No sneezing, itching, ear ache, nasal congestion, post nasal drip,  Cardio-vascular:  No chest pain, Orthopnea, PND, anasarca, dizziness, palpitations.no Bilateral lower extremity swelling  GI:  No heartburn, indigestion, abdominal pain, nausea, vomiting,, change in bowel habits, loss of appetite, melena, blood in stool, hematemesis Resp:  no shortness of breath at rest. No dyspnea on exertion, No excess mucus, no productive cough, No non-productive cough, No coughing up of blood.No change in color of mucus.No wheezing. Skin:  no rash or lesions. No jaundice GU:  no dysuria, change in color of urine, no urgency or frequency. No straining to urinate.  No flank pain.  Musculoskeletal:  No joint pain or no joint swelling. No decreased range of motion. No back pain.  Psych:  No change in mood or affect. No depression or anxiety. No memory loss.  Neuro: no localizing neurological complaints, no tingling, no weakness, no double vision, no gait abnormality, no slurred speech, no confusion  All systems reviewed and apart from [place] all are negative  Past Medical History:   Past Medical History:  Diagnosis Date   Allergy    Anemia    Arthritis  B12 deficiency    Complication of anesthesia    slow to wake up    Depression    Diabetes mellitus type 2, diet-controlled ([place]) 07/24/2018   Diabetes mellitus without complication ([place])    type II - diet controlled ; A1C 6.5 01/2017   Fibromyalgia    Generalized hyperhidrosis 04/17/2014   GERD (gastroesophageal reflux disease)    Heart murmur    Aortic Sclerosis   Hyperlipemia    HYPOTHYROIDISM 05/29/2007   Irritable bowel syndrome    Migraines    hx of    Oxygen  deficiency    2 L at bedtime   Oxygen dependent    uses 2l at hs due to MS Contin per patient   PONV (postoperative nausea and vomiting)    PVC (premature ventricular contraction)    Type II or unspecified type diabetes mellitus with neurological manifestations, not stated as uncontrolled(250.60) 04/07/2013   Unspecified essential hypertension    denies htn on 05/09/16       Past Surgical History:  Procedure Laterality Date   24 HOUR [place] STUDY N/A 01/04/2016   Procedure: 24 HOUR [place] STUDY;  Surgeon: Manus Gunning, MD;  Location: [facility];  Service: Gastroenterology;  Laterality: N/A;   ABDOMINAL HYSTERECTOMY  1988   ADENOIDECTOMY     ANTERIOR CERVICAL DECOMP/DISCECTOMY FUSION  2009, 1993   first procedure in ~ 1993/Dr Aquilla  2005   ESOPHAGEAL MANOMETRY N/A 01/04/2016   Procedure: ESOPHAGEAL MANOMETRY (EM) 24 HR PH;  Surgeon: Manus Gunning, MD;  Location: [facility];  Service: Gastroenterology;  Laterality: N/A;   ESOPHAGOGASTRODUODENOSCOPY (EGD) WITH PROPOFOL N/A 05/17/2016   Procedure: ESOPHAGOGASTRODUODENOSCOPY (EGD) WITH PROPOFOL;  Surgeon: Manus Gunning, MD;  Location: [facility];  Service: Gastroenterology;  Laterality: N/A;   NASAL SINUS SURGERY  2006   SAVORY DILATION N/A 05/17/2016   Procedure: SAVORY DILATION;  Surgeon: Manus Gunning, MD;  Location: [facility];  Service: Gastroenterology;  Laterality: N/A;   TONSILLECTOMY AND ADENOIDECTOMY     TRANSTHORACIC ECHOCARDIOGRAM  2009   Normal LV size and function. EF 60-65%. No regional wall motion and amount is. Mild aortic sclerosis, no stenosis   TUBAL LIGATION  1979    Social History:  Ambulatory  independently      reports that she has never smoked. She has never used smokeless tobacco. She reports previous alcohol use. She reports current drug use. Drug: Morphine.     Family History:   Family  History  Problem Relation Age of Onset   Breast cancer Mother    Diabetes Mother    Heart disease Mother    Allergies Mother    Diabetes Father    Heart disease Father    Emphysema Father    Allergies Father    Stomach cancer Maternal Uncle    Breast cancer Paternal Aunt    Colon cancer Neg Hx     Allergies: Allergies  Allergen Reactions   Rizatriptan Benzoate Anaphylaxis    Throat and Tongue Swelling Closed   Sumatriptan Other (See Comments)    Throat closed & Tongue Swelling   Tizanidine Shortness Of Breath   Aleve [Naproxen Sodium] Hives   Clarithromycin Hives   Cyclobenzaprine Hcl     REACTION: THROAT CLOSED,TOUNGE SWELLING   Doxycycline Diarrhea and Nausea And Vomiting    Reaction: unknown   Duloxetine Nausea Only   Keflex [Cephalexin] Itching  Sores   Metronidazole Diarrhea and Nausea Only   Oxycodone Hcl Diarrhea    Vomiting   Oxycontin [Oxycodone Hcl] Nausea And Vomiting   Penicillins     Reaction: unknown Has patient had a PCN reaction causing immediate rash, facial/tongue/throat swelling, SOB or lightheadedness with hypotension: {unknown Has patient had a PCN reaction causing severe rash involving mucus membranes or skin necrosis: {unknown Has patient had a PCN reaction that required hospitalization {unknown Has patient had a PCN reaction occurring within the last 10 years: {no If all of the above answers are "NO", then may proceed with Cephalosporin use.   Rosuvastatin     REACTION: MUSCLE ACHES   Septra [Sulfamethoxazole-Trimethoprim] Other (See Comments)    Rash and itching   Venlafaxine Hives   Voltaren [Diclofenac Sodium] Hives    oral   Tramadol Diarrhea, Nausea And Vomiting and Palpitations     Prior to Admission medications   Medication Sig Start Date End Date Taking? Authorizing Provider  acetaminophen (TYLENOL) 650 MG CR tablet Take 650 mg by mouth 2 (two) times daily.   Yes [provider]  aspirin  EC 81 MG tablet Take 81 mg by mouth at bedtime.   Yes [provider]  B Complex Vitamins (B COMPLEX 100 PO) Take 100 mg by mouth daily.    Yes [provider]  bisacodyl (DULCOLAX) 5 MG EC tablet Take 5-10 mg by mouth at bedtime.    Yes [provider]  Calcium Carbonate (CALCIUM 600 PO) Take 600 mg by mouth 2 (two) times daily.    Yes [provider]  calcium carbonate (TUMS - DOSED IN MG ELEMENTAL CALCIUM) 500 MG chewable tablet Chew 1-2 tablets by mouth 3 (three) times daily as needed for indigestion or heartburn.   Yes [provider]  carboxymethylcellulose (REFRESH PLUS) 0.5 % SOLN Place 1 drop into both eyes 3 (three) times daily as needed (dry eyes).    Yes [provider]  cetirizine (ZYRTEC) 10 MG tablet Take 10 mg by mouth at bedtime.    Yes [provider]  Cholecalciferol (VITAMIN D) 125 MCG (5000 UT) CAPS Take 5,000 Units by mouth daily.    Yes [provider]  citalopram (CELEXA) 40 MG tablet TAKE 1 TABLET BY MOUTH  DAILY Patient taking differently: Take 40 mg by mouth daily.  02/12/19  Yes Copland, Frederico Hamman, MD  Coenzyme Q-10 100 MG capsule Take 100 mg by mouth daily.   Yes [provider]  diclofenac sodium (VOLTAREN) 1 % GEL Apply 4 g topically 4 (four) times daily as needed (joint pain).    Yes [provider]  docusate sodium (COLACE) 100 MG capsule Take 200 mg by mouth at bedtime.    Yes [provider]  gabapentin (NEURONTIN) 300 MG capsule TAKE 1 CAPSULE BY MOUTH 3  TIMES DAILY Patient taking differently: Take 300 mg by mouth 3 (three) times daily.  02/12/19  Yes Bayard Hugger, NP  GRAPE SEED EXTRACT PO Take 1 tablet by mouth daily.    Yes [provider]  levothyroxine (SYNTHROID) 50 MCG tablet TAKE 1 TABLET BY MOUTH  DAILY Patient taking differently: Take 50 mcg by mouth daily before breakfast.  02/12/19  Yes Copland, Frederico Hamman, MD  loratadine (CLARITIN) 10 MG tablet  Take 10 mg by mouth daily.   Yes [provider]  MAGNESIUM GLYCINATE PLUS PO Take 800 mg by mouth at bedtime.    Yes [provider]  methocarbamol (ROBAXIN) 500  MG tablet TAKE ONE (1) TABLET BY MOUTH EVERY EIGHTHOURS AS NEEDED FOR MUSCLE SPASMS Patient taking differently: Take 500 mg by mouth 3 (three) times daily.  06/13/18  Yes Bayard Hugger, NP  mirtazapine (REMERON) 15 MG tablet TAKE 1 TABLET BY MOUTH AT  BEDTIME Patient taking differently: Take 15 mg by mouth at bedtime.  02/12/19  Yes Copland, Frederico Hamman, MD  morphine (MS CONTIN) 15 MG 12 hr tablet Take 1 tablet (15 mg total) by mouth 3 (three) times daily. 03/19/19  Yes Bayard Hugger, NP  nitroGLYCERIN (NITROSTAT) 0.4 MG SL tablet Place 1 tablet (0.4 mg total) under the tongue every 5 (five) minutes as needed for chest pain. 06/13/18  Yes [name], MD  oxybutynin (DITROPAN-XL) 10 MG 24 hr tablet Take 1 tablet (10 mg total) by mouth at bedtime. 08/24/18  Yes Copland, Frederico Hamman, MD  RABEprazole (ACIPHEX) 20 MG tablet Take 1 tablet (20 mg total) by mouth daily. 02/13/19  Yes Copland, Frederico Hamman, MD  vitamin C (ASCORBIC ACID) 500 MG tablet Take 500 mg by mouth 2 (two) times daily.   Yes [provider]   Physical Exam: Blood pressure 126/65, pulse 72, temperature 98.7 F (37.1 C), temperature source Oral, resp. rate 13, height 5\' 5"  (1.651 m), weight 101.6 kg, SpO2 97 %. 1. General:  in No Acute distress   Chronically ill -appearing 2. Psychological: Alert and  Oriented 3. Head/ENT:     Dry Mucous Membranes                          Head Non traumatic, neck supple                             Poor Dentition 4. SKIN:   decreased Skin turgor,  Skin clean Dry and intact no rash 5. Heart: Regular rate and rhythm no  Murmur, no Rub or gallop 6. Lungs:   no wheezes or crackles   7. Abdomen: Soft, non-tender, Non distended   obese   8. Lower extremities: no clubbing, cyanosis, no  edema 9. Neurologically Grossly  intact, moving all 4 extremities equally  10. MSK: Normal range of motion   All other LABS:     Recent Labs  Lab 04/01/19 1605  WBC 6.3  NEUTROABS 3.7  HGB 13.1  HCT 38.9  MCV 90.9  PLT 242     Recent Labs  Lab 04/01/19 1605  NA 138  K 3.8  CL 100  CO2 28  GLUCOSE 121*  BUN 10  CREATININE 0.76  CALCIUM 8.9  MG 1.8     Recent Labs  Lab 04/01/19 1605  AST 50*  ALT 47*  ALKPHOS 97  BILITOT 0.3  PROT 6.8  ALBUMIN 3.7       Cultures: No results found for: SDES, SPECREQUEST, CULT, REPTSTATUS   Radiological Exams on Admission: Dg Chest Port 1 View  Result Date: 04/01/2019 CLINICAL DATA:  Weakness. EXAM: PORTABLE CHEST 1 VIEW COMPARISON:  Radiographs of June 27, 2009. FINDINGS: The heart size and mediastinal contours are within normal limits. Both lungs are clear. The visualized skeletal structures are unremarkable. IMPRESSION: No active disease. Electronically Signed   By: Marijo Conception M.D.   On: 04/01/2019 16:19    Chart has been reviewed    Assessment/Plan   68 y.o. female with medical history significant of chronic pain, fibromyalgia, sleep-related hypoxia requiring 2 L  at night, obesity, nonobstructive CAD, diet-controlled diabetes     Admitted for new onset a.flutter  Present on Admission:  Flutter-fibrillation ([place]) -  - Admit to step down on Cardizem drip       CHA2D-VASC score 3         startedon  Heparin,         TSH WNL      Cycle cardiac enzymes      Obtain ECHO      Cardiology consulted plant to do TEE/cardioversion in AM  Chest pain - continue to cycle CE, and monitor on tele, will try to use Gi coctail to see if would improve   Hypothyroidism - - Check TSH continue home medications at current dose   HYPERLIPIDEMIA - not on statin   Essential hypertension - currently on diltiazem drip   GERD - continue home medications  Chronic pain - continue home meds   TRANSAMINASES, SERUM, ELEVATED - similar to labs from July,  will order RUQ [place]  And evaluate for fatty liver disease, check hepatitis serologies    Obesity (BMI 30-39.9) - chronic will need to follow up as an outpatient   DM 2- diet controlled will order SSI  Sleep apnea on chronic nocturnal oxygen will continue Other plan as per orders.  DVT prophylaxis:  Heparin drip    Code Status:  FULL CODE  as per patient    I had personally discussed CODE STATUS with patient   Family Communication:   Family not at  Bedside    Disposition Plan:   To home once workup is complete and patient is stable                     Would benefit from PT/OT eval prior to DC  Ordered                                     Consults called: Cardiology is following  Admission status:  ED Disposition    ED Disposition Condition [place]: [place] [[id number]]  Level of Care: Progressive [102]  I expect the patient will be discharged within 24 hours: No (not a candidate for 5C-Observation unit)  Covid Evaluation: Asymptomatic Screening Protocol (No Symptoms)  Diagnosis: Flutter-fibrillation [id number]  Admitting Physician: Toy Baker [[id number]]  Attending Physician: Toy Baker [[id number]]  PT Class (Do Not Modify): Observation [104]  PT Acc Code (Do Not Modify): Observation [10022]       Obs    Level of care      SDU tele indefinitely please discontinue once patient no longer qualifies  Precautions:  No active isolations  PPE: Used by the provider:   P100  eye Goggles,  Gloves    [name] 04/01/2019, 9:25 PM    [facility]     after 2 AM please page floor coverage PA If 7AM-7PM, please contact the day team taking care of the patient using [email or web address]

## 2019-04-01 NOTE — Discharge Instructions (Addendum)
68 year old female with history of PVCs, type 2 diabetes, hypothyroidism, fibromyalgia on 45 mg MEDD presenting for greater than 1 week course of diarrhea, malaise, fatigue, increased chest discomfort and shortness of breath.  EKG done in office, reviewed by me and compared to previous from December 2019: Atrial flutter with variable AV block.  Ventricular rate of 141, the varies between 120-156 while in office.  Given patient's symptoms, pallor, atrial flutter she was transferred to ER via EMS further evaluation/monitoring/treatment.

## 2019-04-01 NOTE — ED Notes (Addendum)
Pt states she wants to take her home meds; pt is on a [facility] control regimen through [facility]; meds include 15 mg morphine, 300 mg gabapentin, and 500 mg robaxin; Dr. Tyrone Nine aware and is agreeable to this

## 2019-04-01 NOTE — Telephone Encounter (Signed)
I spoke with pt; pt wonders if needs covid testing; pt said for 1 wk diarrhea; lightheadedness especially standing;CP on and off; feels like cotton in ears, SOB upon exertion, constant excessive sweating; pt has excessive sweating but is worse than usual. Constant H/A with pain level now of 4. Pt has usual muscle pain that pt attributes to fibromyalgia. Pt does not have cough or fever, no travel and no known exposure to covid. Pt had evisit on 03/29/19 and was advised to go to ED or UC. I advised pt some of her symptoms are not typical covid symptoms and needs to go to ED for eval and possible testing. Pt said she is not going to ED but pt will go to UC probably the [facility] on [address]. FYI to Dr Lorelei Pont.

## 2019-04-01 NOTE — Telephone Encounter (Signed)
noted 

## 2019-04-01 NOTE — ED Notes (Signed)
Cardiology at bedside.

## 2019-04-01 NOTE — ED Provider Notes (Signed)
[facility] EMERGENCY DEPARTMENT Provider Note   CSN: [id number] Arrival date & time: 04/01/19  1539     History   Chief Complaint Chief Complaint  Patient presents with  . Dizziness  . Tachycardia    HPI [name] is a 68 y.o. female.     68 yo F with a chief complaints of feeling weak.  This been going on for about a week and a half or so.  Receded by a 2-day course of profound watery diarrhea.  That resolved and since then she has felt very fatigued.  She feels like she has cotton in her ears when she stands up she feels like she may pass out.  She denies any dark stool or blood in her stool.  States that she has been eating and drinking well.  She called her family physician who told her to go to urgent care.  Upon arrival there it was noted that her heart rate was in the 150s and she was sent here for evaluation.  Patient has had some chest pain off and on with this.  States it feels like her typical reflux symptoms.  Right now feels like her baseline if reflux.  Felt it was much worse last night.  Has not changed significantly since last night.  No fevers or chills.  No cough for congestion.  Has had some shortness of breath.  No urinary symptoms.  No abdominal pain.  She is on levothyroxine.  Has had no recent medication change.  The history is provided by the patient.  Dizziness Associated symptoms: chest pain (feels like her chronic reflux), diarrhea (a week ago, resolved), headaches and weakness   Associated symptoms: no nausea, no palpitations, no shortness of breath and no vomiting   Illness Severity:  Moderate Onset quality:  Gradual Duration:  1 week Timing:  Constant Progression:  Worsening Chronicity:  New Associated symptoms: chest pain (feels like her chronic reflux), diarrhea (a week ago, resolved), fatigue and headaches   Associated symptoms: no abdominal pain, no congestion, no fever, no myalgias, no nausea, no rhinorrhea, no shortness of  breath, no vomiting and no wheezing     Past Medical History:  Diagnosis Date  . Allergy   . Anemia   . Arthritis   . B12 deficiency   . Complication of anesthesia    slow to wake up   . Depression   . Diabetes mellitus type 2, diet-controlled ([place]) 07/24/2018  . Diabetes mellitus without complication ([place])    type II - diet controlled ; A1C 6.5 01/2017  . Fibromyalgia   . Generalized hyperhidrosis 04/17/2014  . GERD (gastroesophageal reflux disease)   . Heart murmur    Aortic Sclerosis  . Hyperlipemia   . HYPOTHYROIDISM 05/29/2007  . Irritable bowel syndrome   . Migraines    hx of   . Oxygen deficiency    2 L at bedtime  . 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 PONV (postoperative nausea and vomiting)   . PVC (premature ventricular contraction)   . Type II or unspecified type diabetes mellitus with neurological manifestations, not stated as uncontrolled(250.60) 04/07/2013  . Unspecified essential hypertension    denies htn on 05/09/16    Patient Active Problem List   Diagnosis Date Noted  . Flutter-fibrillation ([place]) 04/01/2019  . Diabetes mellitus type 2, diet-controlled ([place]) 07/24/2018  . Status post bunionectomy 04/24/2018  . Postoperative wound infection  04/24/2018  . Lipoma of buttock 01/10/2017  . Chronic respiratory failure with hypoxia ([place]) 04/07/2015  . Obesity hypoventilation syndrome ([place]) 03/26/2015  . Generalized hyperhidrosis 04/17/2014  . Obesity (BMI 30-39.9) 08/23/2013  . 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 Postlaminectomy syndrome, cervical region 08/29/2011  . Dysphagia 07/14/2010  . TRANSAMINASES, SERUM, ELEVATED 12/24/2008  . Depression, major, recurrent, in partial remission ([place]) 09/22/2008  . ALLERGIC RHINITIS 09/22/2008  . Hypothyroidism 05/29/2007  . HYPERLIPIDEMIA 05/29/2007  . Vitamin D deficiency 01/11/2007  . FIBROMYALGIA 01/11/2007  . Vitamin B 12 deficiency  11/13/2006  . Essential hypertension 11/13/2006  . GERD 11/13/2006    Past Surgical History:  Procedure Laterality Date  . [place] STUDY N/A 01/04/2016   Procedure: [place] STUDY;  Surgeon: Manus Gunning, MD;  Location: [facility];  Service: Gastroenterology;  Laterality: N/A;  . ABDOMINAL HYSTERECTOMY  1988  . ADENOIDECTOMY    . ANTERIOR CERVICAL DECOMP/DISCECTOMY FUSION  2009, 1993   first procedure in ~ 1993/Dr Botero  . APPENDECTOMY  1988  . BACK SURGERY    . CHOLECYSTECTOMY  2005  . ESOPHAGEAL MANOMETRY N/A 01/04/2016   Procedure: ESOPHAGEAL MANOMETRY (EM) 24 HR PH;  Surgeon: Manus Gunning, MD;  Location: [facility];  Service: Gastroenterology;  Laterality: N/A;  . ESOPHAGOGASTRODUODENOSCOPY (EGD) WITH PROPOFOL N/A 05/17/2016   Procedure: ESOPHAGOGASTRODUODENOSCOPY (EGD) WITH PROPOFOL;  Surgeon: Manus Gunning, MD;  Location: [facility];  Service: Gastroenterology;  Laterality: N/A;  . NASAL SINUS SURGERY  2006  . SAVORY DILATION N/A 05/17/2016   Procedure: SAVORY DILATION;  Surgeon: Manus Gunning, MD;  Location: [facility];  Service: Gastroenterology;  Laterality: N/A;  . TONSILLECTOMY AND ADENOIDECTOMY    . TRANSTHORACIC ECHOCARDIOGRAM  2009   Normal LV size and function. EF 60-65%. No regional wall motion and amount is. Mild aortic sclerosis, no stenosis  . TUBAL LIGATION  1979     OB History   No obstetric history on file.      Home Medications    Prior to Admission medications   Medication Sig Start Date End Date Taking? Authorizing Provider  acetaminophen (TYLENOL) 650 MG CR tablet Take 650 mg by mouth 2 (two) times daily.   Yes [provider]  aspirin EC 81 MG tablet Take 81 mg by mouth at bedtime.   Yes [provider]  B Complex Vitamins (B COMPLEX 100 PO) Take 100 mg by mouth daily.    Yes [provider]  bisacodyl (DULCOLAX) 5 MG EC tablet Take 5-10 mg by mouth at bedtime.    Yes  [provider]  Calcium Carbonate (CALCIUM 600 PO) Take 600 mg by mouth 2 (two) times daily.    Yes [provider]  calcium carbonate (TUMS - DOSED IN MG ELEMENTAL CALCIUM) 500 MG chewable tablet Chew 1-2 tablets by mouth 3 (three) times daily as needed for indigestion or heartburn.   Yes [provider]  carboxymethylcellulose (REFRESH PLUS) 0.5 % SOLN Place 1 drop into both eyes 3 (three) times daily as needed (dry eyes).    Yes [provider]  cetirizine (ZYRTEC) 10 MG tablet Take 10 mg by mouth at bedtime.    Yes [provider]  Cholecalciferol (VITAMIN D) 125 MCG (5000 UT) CAPS Take 5,000 Units by mouth daily.    Yes [provider]  citalopram (CELEXA) 40 MG tablet TAKE 1 TABLET BY MOUTH  DAILY Patient taking differently: Take 40 mg  by mouth daily.  02/12/19  Yes Copland, Frederico Hamman, MD  Coenzyme Q-10 100 MG capsule Take 100 mg by mouth daily.   Yes [provider]  diclofenac sodium (VOLTAREN) 1 % GEL Apply 4 g topically 4 (four) times daily as needed (joint pain).    Yes [provider]  docusate sodium (COLACE) 100 MG capsule Take 200 mg by mouth at bedtime.    Yes [provider]  gabapentin (NEURONTIN) 300 MG capsule TAKE 1 CAPSULE BY MOUTH 3  TIMES DAILY Patient taking differently: Take 300 mg by mouth 3 (three) times daily.  02/12/19  Yes Bayard Hugger, NP  GRAPE SEED EXTRACT PO Take 1 tablet by mouth daily.    Yes [provider]  levothyroxine (SYNTHROID) 50 MCG tablet TAKE 1 TABLET BY MOUTH  DAILY Patient taking differently: Take 50 mcg by mouth daily before breakfast.  02/12/19  Yes Copland, Frederico Hamman, MD  loratadine (CLARITIN) 10 MG tablet Take 10 mg by mouth daily.   Yes [provider]  MAGNESIUM GLYCINATE PLUS PO Take 800 mg by mouth at bedtime.    Yes [provider]  methocarbamol (ROBAXIN) 500 MG tablet TAKE ONE (1) TABLET BY MOUTH EVERY EIGHTHOURS AS NEEDED FOR MUSCLE  SPASMS Patient taking differently: Take 500 mg by mouth 3 (three) times daily.  06/13/18  Yes Bayard Hugger, NP  mirtazapine (REMERON) 15 MG tablet TAKE 1 TABLET BY MOUTH AT  BEDTIME Patient taking differently: Take 15 mg by mouth at bedtime.  02/12/19  Yes Copland, Frederico Hamman, MD  morphine (MS CONTIN) 15 MG 12 hr tablet Take 1 tablet (15 mg total) by mouth 3 (three) times daily. 03/19/19  Yes Bayard Hugger, NP  nitroGLYCERIN (NITROSTAT) 0.4 MG SL tablet Place 1 tablet (0.4 mg total) under the tongue every 5 (five) minutes as needed for chest pain. 06/13/18  Yes [name], MD  oxybutynin (DITROPAN-XL) 10 MG 24 hr tablet Take 1 tablet (10 mg total) by mouth at bedtime. 08/24/18  Yes Copland, Frederico Hamman, MD  RABEprazole (ACIPHEX) 20 MG tablet Take 1 tablet (20 mg total) by mouth daily. 02/13/19  Yes Copland, Frederico Hamman, MD  vitamin C (ASCORBIC ACID) 500 MG tablet Take 500 mg by mouth 2 (two) times daily.   Yes [provider]    Family History Family History  Problem Relation Age of Onset  . Breast cancer Mother   . Diabetes Mother   . Heart disease Mother   . Allergies Mother   . Diabetes Father   . Heart disease Father   . Emphysema Father   . Allergies Father   . Stomach cancer Maternal Uncle   . Breast cancer Paternal Aunt   . Colon cancer Neg Hx     Social History Social History   Tobacco Use  . Smoking status: Never Smoker  . Smokeless tobacco: Never Used  Substance Use Topics  . Alcohol use: Not Currently    Alcohol/week: 0.0 standard drinks    Comment: rare  . Drug use: Yes    Types: Morphine     Allergies   Rizatriptan benzoate, Sumatriptan, Tizanidine, Aleve [naproxen sodium], Clarithromycin, Cyclobenzaprine hcl, Doxycycline, Duloxetine, Keflex [cephalexin], Metronidazole, Oxycodone hcl, Oxycontin [oxycodone hcl], Penicillins, Rosuvastatin, Septra [sulfamethoxazole-trimethoprim], Venlafaxine, Voltaren [diclofenac sodium], and Tramadol   Review of Systems  Review of Systems  Constitutional: Positive for fatigue. Negative for chills and fever.  HENT: Negative for congestion and rhinorrhea.   Eyes: Negative for redness and visual disturbance.  Respiratory: Negative for shortness of breath and wheezing.   Cardiovascular: Positive for chest pain (feels like her chronic reflux). Negative for palpitations.  Gastrointestinal: Positive for diarrhea (a week ago, resolved). Negative for abdominal pain, nausea and vomiting.  Genitourinary: Negative for dysuria and urgency.  Musculoskeletal: Negative for arthralgias and myalgias.  Skin: Negative for pallor and wound.  Neurological: Positive for dizziness, weakness and headaches.     Physical Exam Updated Vital Signs BP 107/85   Pulse 78   Temp 98.7 F (37.1 C) (Oral)   Resp 13   Ht 5\' 5"  (1.651 m)   Wt 101.6 kg   SpO2 93%   BMI 37.28 kg/m   Physical Exam Vitals signs and nursing note reviewed.  Constitutional:      General: She is not in acute distress.    Appearance: She is well-developed. She is not diaphoretic.  HENT:     Head: Normocephalic and atraumatic.  Eyes:     Pupils: Pupils are equal, round, and reactive to light.  Neck:     Musculoskeletal: Normal range of motion and neck supple.  Cardiovascular:     Rate and Rhythm: Regular rhythm. Tachycardia present.     Heart sounds: No murmur. No friction rub. No gallop.   Pulmonary:     Effort: Pulmonary effort is normal.     Breath sounds: No wheezing or rales.  Abdominal:     General: There is no distension.     Palpations: Abdomen is soft.     Tenderness: There is no abdominal tenderness.  Musculoskeletal:        General: No tenderness.  Skin:    General: Skin is warm and dry.  Neurological:     Mental Status: She is alert and oriented to person, place, and time.  Psychiatric:        Behavior: Behavior normal.      ED Treatments / Results  Labs (all labs ordered are listed, but only abnormal results are  displayed) Labs Reviewed  COMPREHENSIVE METABOLIC PANEL - Abnormal; Notable for the following components:      Result Value   Glucose, Bld 121 (*)    AST 50 (*)    ALT 47 (*)    All other components within normal limits  URINALYSIS, ROUTINE W REFLEX MICROSCOPIC - Abnormal; Notable for the following components:   Specific Gravity, Urine 1.003 (*)    All other components within normal limits  CBG MONITORING, ED - Abnormal; Notable for the following components:   Glucose-Capillary 125 (*)    All other components within normal limits  SARS CORONAVIRUS 2 (HOSPITAL ORDER, [facility])  CBC WITH DIFFERENTIAL/PLATELET  TSH  T4, FREE  MAGNESIUM  HEPARIN LEVEL (UNFRACTIONATED)  CBC  HEPATITIS PANEL, ACUTE  TROPONIN I (HIGH SENSITIVITY)  TROPONIN I (HIGH SENSITIVITY)  TROPONIN I (HIGH SENSITIVITY)    EKG EKG Interpretation  Date/Time:  Monday April 01 2019 17:30:06 EDT Ventricular Rate:  148 PR Interval:    QRS Duration: 74 QT Interval:  329 QTC Calculation: 517 R Axis:   55 Text Interpretation:  Sinus or ectopic atrial tachycardia ST depression, probably rate related Prolonged QT interval No significant change since last tracing Confirmed by Deno Etienne [phone]) on 04/01/2019 6:14:26 PM   EKG Interpretation  Date/Time:  Monday April 01 2019 18:14:12 EDT Ventricular Rate:  146 PR Interval:    QRS Duration: 95 QT Interval:  349 QTC Calculation: 544 R Axis:   61 Text  Interpretation:  Sinus tachycardia Consider right atrial enlargement Prolonged QT interval No significant change since last tracing Confirmed by Deno Etienne [phone]) on 04/01/2019 6:15:45 PM       Radiology Dg Chest Port 1 View  Result Date: 04/01/2019 CLINICAL DATA:  Weakness. EXAM: PORTABLE CHEST 1 VIEW COMPARISON:  Radiographs of June 27, 2009. FINDINGS: The heart size and mediastinal contours are within normal limits. Both lungs are clear. The visualized skeletal structures are  unremarkable. IMPRESSION: No active disease. Electronically Signed   By: Marijo Conception M.D.   On: 04/01/2019 16:19    Procedures Procedures (including critical care time)  Medications Ordered in ED Medications  diltiazem (CARDIZEM) 1 mg/mL load via infusion 20 mg (20 mg Intravenous Bolus from Bag 04/01/19 1813)    And  diltiazem (CARDIZEM) 100 mg in dextrose 5% 166mL (1 mg/mL) infusion (12.5 mg/hr Intravenous Rate/Dose Change 04/01/19 2109)  heparin ADULT infusion 100 units/mL (25000 units/265mL sodium chloride 0.45%) (1,150 Units/hr Intravenous New Bag/Given 04/01/19 1851)  morphine 2 MG/ML injection 2 mg (has no administration in time range)  sodium chloride 0.9 % bolus 1,000 mL (0 mLs Intravenous Stopped 04/01/19 1710)  sodium chloride 0.9 % bolus 1,000 mL (0 mLs Intravenous Stopped 04/01/19 1956)  heparin bolus via infusion 4,000 Units (4,000 Units Intravenous Bolus from Bag 04/01/19 1852)  alum & mag hydroxide-simeth (MAALOX/MYLANTA) 200-200-20 MG/5ML suspension 30 mL (30 mLs Oral Given 04/01/19 2032)     Initial Impression / Assessment and Plan / ED Course  I have reviewed the triage vital signs and the nursing notes.  Pertinent labs & imaging results that were available during my care of the patient were reviewed by me and considered in my medical decision making (see chart for details).        68 yo F with a chief complaint of fatigue.  Patient was found to be in a rapid heart rate is about 150.  Will obtain an EKG lab work fluid bolus reassess.  The patient had some transient improvement into the 120s.  The patient may have had a 3-1 block.  By the time he got a repeat EKG she had gone back into the 150s.  Likely this is atrial flutter with 2-1 block.  Will start on a Cardizem drip.  Patient's heart rate has improved significantly.  I can still see flutter waves to the heart rate is having of variable ventricular rate.  Mostly in the 80s but goes into the low 100s.  Patient is  still very symptomatic with this.  I discussed the case with Dr. Davina Poke, cardiology.  They will come and evaluate the patient for possible cardioversion versus ablation tomorrow.  Recommended medical admission.  CRITICAL CARE Performed by: Cecilio Asper   Total critical care time: 35 minutes  Critical care time was exclusive of separately billable procedures and treating other patients.  Critical care was necessary to treat or prevent imminent or life-threatening deterioration.  Critical care was time spent personally by me on the following activities: development of treatment plan with patient and/or surrogate as well as nursing, discussions with consultants, evaluation of patient's response to treatment, examination of patient, obtaining history from patient or surrogate, ordering and performing treatments and interventions, ordering and review of laboratory studies, ordering and review of radiographic studies, pulse oximetry and re-evaluation of patient's condition.   The patients results and plan were reviewed and discussed.   Any x-rays performed were independently reviewed by myself.   Differential diagnosis were  considered with the presenting HPI.  Medications  diltiazem (CARDIZEM) 1 mg/mL load via infusion 20 mg (20 mg Intravenous Bolus from Bag 04/01/19 1813)    And  diltiazem (CARDIZEM) 100 mg in dextrose 5% 17mL (1 mg/mL) infusion (12.5 mg/hr Intravenous Rate/Dose Change 04/01/19 2109)  heparin ADULT infusion 100 units/mL (25000 units/260mL sodium chloride 0.45%) (1,150 Units/hr Intravenous New Bag/Given 04/01/19 1851)  morphine 2 MG/ML injection 2 mg (has no administration in time range)  sodium chloride 0.9 % bolus 1,000 mL (0 mLs Intravenous Stopped 04/01/19 1710)  sodium chloride 0.9 % bolus 1,000 mL (0 mLs Intravenous Stopped 04/01/19 1956)  heparin bolus via infusion 4,000 Units (4,000 Units Intravenous Bolus from Bag 04/01/19 1852)  alum & mag hydroxide-simeth  (MAALOX/MYLANTA) 200-200-20 MG/5ML suspension 30 mL (30 mLs Oral Given 04/01/19 2032)    Vitals:   04/01/19 1930 04/01/19 2030 04/01/19 2100 04/01/19 2115  BP: 131/63 117/74 (!) 140/49 107/85  Pulse: 73 (!) 153 (!) 59 78  Resp: 13 16 10 13   Temp:      TempSrc:      SpO2: 100% 98% 97% 93%  Weight:      Height:        Final diagnoses:  Atrial flutter with rapid ventricular response ([place])    Admission/ observation were discussed with the admitting physician, patient and/or family and they are comfortable with the plan.    Final Clinical Impressions(s) / ED Diagnoses   Final diagnoses:  Atrial flutter with rapid ventricular response [facility])    ED Discharge Orders    None       Deno Etienne, DO 04/01/19 2247

## 2019-04-02 ENCOUNTER — Ambulatory Visit (HOSPITAL_COMMUNITY): Payer: Medicare Other

## 2019-04-02 DIAGNOSIS — I10 Essential (primary) hypertension: Secondary | ICD-10-CM | POA: Diagnosis not present

## 2019-04-02 DIAGNOSIS — E119 Type 2 diabetes mellitus without complications: Secondary | ICD-10-CM | POA: Diagnosis not present

## 2019-04-02 DIAGNOSIS — R609 Edema, unspecified: Secondary | ICD-10-CM | POA: Diagnosis not present

## 2019-04-02 DIAGNOSIS — I4891 Unspecified atrial fibrillation: Principal | ICD-10-CM

## 2019-04-02 DIAGNOSIS — I4892 Unspecified atrial flutter: Secondary | ICD-10-CM

## 2019-04-02 LAB — CBC
HCT: 35.3 % — ABNORMAL LOW (ref 36.0–46.0)
Hemoglobin: 12 g/dL (ref 12.0–15.0)
MCH: 31.5 pg (ref 26.0–34.0)
MCHC: 34 g/dL (ref 30.0–36.0)
MCV: 92.7 fL (ref 80.0–100.0)
Platelets: 210 10*3/uL (ref 150–400)
RBC: 3.81 MIL/uL — ABNORMAL LOW (ref 3.87–5.11)
RDW: 12.2 % (ref 11.5–15.5)
WBC: 6.1 10*3/uL (ref 4.0–10.5)
nRBC: 0 % (ref 0.0–0.2)

## 2019-04-02 LAB — COMPREHENSIVE METABOLIC PANEL
ALT: 41 U/L (ref 0–44)
AST: 46 U/L — ABNORMAL HIGH (ref 15–41)
Albumin: 3.3 g/dL — ABNORMAL LOW (ref 3.5–5.0)
Alkaline Phosphatase: 92 U/L (ref 38–126)
Anion gap: 9 (ref 5–15)
BUN: 8 mg/dL (ref 8–23)
CO2: 28 mmol/L (ref 22–32)
Calcium: 8.8 mg/dL — ABNORMAL LOW (ref 8.9–10.3)
Chloride: 106 mmol/L (ref 98–111)
Creatinine, Ser: 0.66 mg/dL (ref 0.44–1.00)
GFR calc Af Amer: >60 mL/min (ref >60)
GFR calc non Af Amer: >60 mL/min (ref >60)
Glucose, Bld: 134 mg/dL — ABNORMAL HIGH (ref 70–99)
Potassium: 4.2 mmol/L (ref 3.5–5.1)
Sodium: 143 mmol/L (ref 135–145)
Total Bilirubin: 0.3 mg/dL (ref 0.3–1.2)
Total Protein: 6.1 g/dL — ABNORMAL LOW (ref 6.5–8.1)

## 2019-04-02 LAB — MAGNESIUM: Magnesium: 1.9 mg/dL (ref 1.7–2.4)

## 2019-04-02 LAB — NOVEL CORONAVIRUS, NAA: SARS-CoV-2, NAA: NOT DETECTED

## 2019-04-02 LAB — GLUCOSE, CAPILLARY
Glucose-Capillary: 122 mg/dL — ABNORMAL HIGH (ref 70–99)
Glucose-Capillary: 232 mg/dL — ABNORMAL HIGH (ref 70–99)
Glucose-Capillary: 136 mg/dL — ABNORMAL HIGH (ref 70–99)
Glucose-Capillary: 175 mg/dL — ABNORMAL HIGH (ref 70–99)

## 2019-04-02 LAB — HEPARIN LEVEL (UNFRACTIONATED)
Heparin Unfractionated: 0.32 IU/mL (ref 0.30–0.70)
Heparin Unfractionated: 0.3 IU/mL (ref 0.30–0.70)

## 2019-04-02 LAB — TSH: TSH: 2.528 u[IU]/mL (ref 0.350–4.500)

## 2019-04-02 LAB — HEPATITIS PANEL, ACUTE
HCV Ab: NONREACTIVE
Hep A IgM: NONREACTIVE
Hep B C IgM: NONREACTIVE
Hepatitis B Surface Ag: NONREACTIVE

## 2019-04-02 LAB — PHOSPHORUS: Phosphorus: 3.2 mg/dL (ref 2.5–4.6)

## 2019-04-02 LAB — HEMOGLOBIN A1C
Hgb A1c MFr Bld: 7 % — ABNORMAL HIGH (ref 4.8–5.6)
Mean Plasma Glucose: 154.2 mg/dL

## 2019-04-02 LAB — CBG MONITORING, ED
Glucose-Capillary: 128 mg/dL — ABNORMAL HIGH (ref 70–99)
Glucose-Capillary: 130 mg/dL — ABNORMAL HIGH (ref 70–99)

## 2019-04-02 LAB — HIV ANTIBODY (ROUTINE TESTING W REFLEX): HIV Screen 4th Generation wRfx: NONREACTIVE

## 2019-04-02 MED ORDER — SODIUM CHLORIDE 0.9 % IV SOLN: INTRAVENOUS | Status: DC

## 2019-04-02 MED ORDER — LEVOTHYROXINE SODIUM 50 MCG PO TABS
50.0000 ug | ORAL_TABLET | Freq: Every day | ORAL | Status: DC
  Administered 2019-04-02 – 2019-04-04 (×3): 50 ug via ORAL
  Filled 2019-04-02 (×3): qty 1

## 2019-04-02 MED ORDER — DILTIAZEM HCL 100 MG IV SOLR
5.0000 mg/h | INTRAVENOUS | Status: DC
  Administered 2019-04-02: 15 mg/h via INTRAVENOUS
  Administered 2019-04-02: 12.5 mg/h via INTRAVENOUS
  Administered 2019-04-02 – 2019-04-03 (×3): 15 mg/h via INTRAVENOUS
  Administered 2019-04-04: 10 mg/h via INTRAVENOUS
  Filled 2019-04-02 (×12): qty 100

## 2019-04-02 NOTE — Progress Notes (Signed)
[place] for Heparin Indication: atrial fibrillation  Allergies  Allergen Reactions  . Rizatriptan Benzoate Anaphylaxis    Throat and Tongue Swelling Closed  . Sumatriptan Other (See Comments)    Throat closed & Tongue Swelling  . Tizanidine Shortness Of Breath  . Aleve [Naproxen Sodium] Hives  . Clarithromycin Hives  . Cyclobenzaprine Hcl     REACTION: THROAT CLOSED,TOUNGE SWELLING  . Doxycycline Diarrhea and Nausea And Vomiting    Reaction: unknown  . Duloxetine Nausea Only  . Keflex [Cephalexin] Itching    Sores  . Metronidazole Diarrhea and Nausea Only  . Oxycodone Hcl Diarrhea    Vomiting  . Oxycontin [Oxycodone Hcl] Nausea And Vomiting  . Penicillins     Reaction: unknown Has patient had a PCN reaction causing immediate rash, facial/tongue/throat swelling, SOB or lightheadedness with hypotension: {unknown Has patient had a PCN reaction causing severe rash involving mucus membranes or skin necrosis: {unknown Has patient had a PCN reaction that required hospitalization {unknown Has patient had a PCN reaction occurring within the last 10 years: {no If all of the above answers are "NO", then may proceed with Cephalosporin use.  . Rosuvastatin     REACTION: MUSCLE ACHES  . Septra [Sulfamethoxazole-Trimethoprim] Other (See Comments)    Rash and itching  . Venlafaxine Hives  . Voltaren [Diclofenac Sodium] Hives    oral  . Tramadol Diarrhea, Nausea And Vomiting and Palpitations    Patient Measurements: Height: 5\' 5"  (165.1 cm) Weight: 224 lb (101.6 kg) IBW/kg (Calculated) : 57 Heparin Dosing Weight: 80.4  Vital Signs: BP: 108/74 (10/06 0400) Pulse Rate: 115 (10/06 0400)  Labs: Recent Labs    04/01/19 1605 04/01/19 2010 04/01/19 2154 04/02/19 0405  HGB 13.1  --   --  12.0  HCT 38.9  --   --  35.3*  PLT 242  --   --  210  HEPARINUNFRC  --   --   --  0.32  CREATININE 0.76  --   --  0.66  TROPONINIHS 8 7 8   --      Estimated Creatinine Clearance: 80.6 mL/min (by C-G formula based on SCr of 0.66 mg/dL).   Assessment: 68 yo F with a chief complaints of feeling weak. She called her family physician who told her to go to urgent care.  Upon arrival there it was noted that her heart rate was in the 150s and she was sent here for evaluation. Pharmacy consulted to start heparin for atrial fibrillation.  Initial heparin level 0.32 units/ml  Goal of Therapy:  Heparin level 0.3-0.7 units/ml Monitor platelets by anticoagulation protocol: Yes   Plan:  Continue heparin drip at 1150 units/hr Check heparin level later today to confirm  Thanks for allowing pharmacy to be a part of this patient's care.  Excell Seltzer, PharmD Clinical Pharmacist 04/02/2019, 5:25 AM

## 2019-04-02 NOTE — Progress Notes (Signed)
[place] for Heparin Indication: atrial fibrillation  Allergies  Allergen Reactions  . Rizatriptan Benzoate Anaphylaxis    Throat and Tongue Swelling Closed  . Sumatriptan Other (See Comments)    Throat closed & Tongue Swelling  . Tizanidine Shortness Of Breath  . Aleve [Naproxen Sodium] Hives  . Clarithromycin Hives  . Cyclobenzaprine Hcl     REACTION: THROAT CLOSED,TOUNGE SWELLING  . Doxycycline Diarrhea and Nausea And Vomiting    Reaction: unknown  . Duloxetine Nausea Only  . Keflex [Cephalexin] Itching    Sores  . Metronidazole Diarrhea and Nausea Only  . Oxycodone Hcl Diarrhea    Vomiting  . Oxycontin [Oxycodone Hcl] Nausea And Vomiting  . Penicillins     Reaction: unknown Has patient had a PCN reaction causing immediate rash, facial/tongue/throat swelling, SOB or lightheadedness with hypotension: {unknown Has patient had a PCN reaction causing severe rash involving mucus membranes or skin necrosis: {unknown Has patient had a PCN reaction that required hospitalization {unknown Has patient had a PCN reaction occurring within the last 10 years: {no If all of the above answers are "NO", then may proceed with Cephalosporin use.  . Rosuvastatin     REACTION: MUSCLE ACHES  . Septra [Sulfamethoxazole-Trimethoprim] Other (See Comments)    Rash and itching  . Venlafaxine Hives  . Voltaren [Diclofenac Sodium] Hives    oral  . Tramadol Diarrhea, Nausea And Vomiting and Palpitations    Patient Measurements: Height: 5\' 5"  (165.1 cm) Weight: 219 lb 9.6 oz (99.6 kg) IBW/kg (Calculated) : 57 Heparin Dosing Weight: 80.4  Vital Signs: Temp: 97.7 F (36.5 C) (10/06 0800) Temp Source: Oral (10/06 0800) BP: 124/34 (10/06 1000) Pulse Rate: 93 (10/06 0619)  Labs: Recent Labs    04/01/19 1605 04/01/19 2010 04/01/19 2154 04/02/19 0405 04/02/19 0950  HGB 13.1  --   --  12.0  --   HCT 38.9  --   --  35.3*  --   PLT 242  --   --  210   --   HEPARINUNFRC  --   --   --  0.32 0.30  CREATININE 0.76  --   --  0.66  --   TROPONINIHS 8 7 8   --   --     Estimated Creatinine Clearance: 79.7 mL/min (by C-G formula based on SCr of 0.66 mg/dL).   Assessment: 68 yo F with a chief complaints of feeling weak. She called her family physician who told her to go to urgent care.  Upon arrival there it was noted that her heart rate was in the 150s and she was sent here for evaluation. Pharmacy consulted to start heparin for atrial fibrillation.  Confirmatory heparin level is 0.3 - will increase slightly to keep within therapeutic range.  Goal of Therapy:  Heparin level 0.3-0.7 units/ml Monitor platelets by anticoagulation protocol: Yes   Plan:  -Increase heparin to 1200 units/hr -Daily heparin level and CBC   Arrie Senate, PharmD, BCPS Clinical Pharmacist [phone] Please check AMION for all [facility] Pharmacy numbers 04/02/2019

## 2019-04-02 NOTE — Progress Notes (Signed)
PROGRESS NOTE  WARD AMMIRATI [id number] DOB: 1950-08-27 DOA: 04/01/2019 PCP: Owens Loffler, MD  HPI/Recap of past 24 hours: [name] is a 68 y.o. female with medical history significant of chronic pain, fibromyalgia, sleep-related hypoxia requiring 2 L at night, obesity, nonobstructive CAD, diet-controlled diabetes    Presented with 9 days history of fatigue, lighthead after experiencing diarrhea 1 weeks ago lasted 2 days since then  she has been feeling some palpitations, headache,  and chest tightness she presented to her urgent care today and was found to be tachycardic into the 150s and was sent to emergency department where she was found to be in a flutter with initial rates of 150s started on IV diltiazem after administration of a bolus cardiology was consulted. Patient has no prior history of this denies any history of bleeding not apparent on anticoagulation Denies any fevers or chills no cough no shortness of breath.  no sick contacts No COVID exposure have been isolating at home She reports severe chest pain in the middle of her chest with some belching and burping  She has had some discomfort while in ER not rate dependent not worse with inspiration It feels like aching dull pain non positional   No recent change in medications  04/02/19: Patient was seen and examined at her bedside this morning.  No acute events overnight.  This morning reports intermittent tachycardia with no dyspnea.  Denies chest pain.  Currently on heparin drip and diltiazem drip.  Assessment/Plan: Active Problems:   Hypothyroidism   HYPERLIPIDEMIA   Essential hypertension   GERD   TRANSAMINASES, SERUM, ELEVATED   Obesity (BMI 30-39.9)   Diabetes mellitus type 2, diet-controlled (HCC)   Flutter-fibrillation ([place])  New onset newly diagnosed A. fib/atrial fibrillation with RVR Presented with tachycardia, heart rate in the 150s Started on heparin drip and Cardizem drip She seen by  cardiology with recommendation for TEE 8/8 cardioversion possibly tomorrow 04/03/19 Chads Vascor 4, TSH normal 2D echo pending Blood pressure soft, continue close monitoring on telemetry Maintain map greater than 65 Continue heparin drip and Cardizem drip as recommended by cardiology  Hypothyroidism TSH normal Continue levothyroxine  Essential hypertension Blood pressure is currently soft On Cardizem drip, continue for rate control Closely monitor blood pressure Maintain map greater than 65  History of nonobstructive coronary artery disease Discontinue aspirin per cardiology She will need to follow-up with Dr. Ellyn Hack concerning initiation of a statin Allergic to Crestor per medical records  . HYPERLIPIDEMIA - not on statin  . GERD - continue home medications  Chronic pain - continue home meds  . TRANSAMINASES, SERUM, ELEVATED - similar to labs from July, will order RUQ [place]  And evaluate for fatty liver disease, check hepatitis serologies   . Obesity (BMI 30-39.9) - chronic will need to follow up as an outpatient   DM 2- diet controlled will order SSI  Sleep apnea on chronic nocturnal oxygen will continue Other plan as per orders.  DVT prophylaxis:  Heparin drip    Code Status:  FULL CODE    Family Communication:   Family not at  Bedside    Disposition Plan:   To home once workup is complete and patient is stable                     Would benefit from PT/OT eval prior to DC  Ordered  Consults called: Cardiology   Objective: Vitals:   04/02/19 0400 04/02/19 0619 04/02/19 0800 04/02/19 1000  BP: 108/74 127/83 123/65 (!) 124/34  Pulse: (!) 115 93    Resp: 14 18 18    Temp:  98 F (36.7 C) 97.7 F (36.5 C)   TempSrc:  Oral Oral   SpO2: 99% 98% 97%   Weight:  99.6 kg    Height:  5\' 5"  (1.651 m)      Intake/Output Summary (Last 24 hours) at 04/02/2019 1223 Last data filed at 04/02/2019 1055 Gross per 24 hour   Intake 3172.94 ml  Output 800 ml  Net 2372.94 ml   Filed Weights   04/01/19 1551 04/02/19 0619  Weight: 101.6 kg 99.6 kg    Exam:  . General: 68 y.o. year-old female well developed well nourished in no acute distress.  Alert and oriented x4. . Cardiovascular: Irregular rate and rhythm with no rubs or gallops.  No thyromegaly or JVD noted.   Marland Kitchen Respiratory: Clear to auscultation with no wheezes or rales. Good inspiratory effort. . Abdomen: Soft nontender nondistended with normal bowel sounds x4 quadrants. . Musculoskeletal: No lower extremity edema. 2/4 pulses in all 4 extremities. Marland Kitchen Psychiatry: Mood is appropriate for condition and setting   Data Reviewed: CBC: Recent Labs  Lab 04/01/19 1605 04/02/19 0405  WBC 6.3 6.1  NEUTROABS 3.7  --   HGB 13.1 12.0  HCT 38.9 35.3*  MCV 90.9 92.7  PLT 242 [id number]   Basic Metabolic Panel: Recent Labs  Lab 04/01/19 1605 04/02/19 0405  NA 138 143  K 3.8 4.2  CL 100 106  CO2 28 28  GLUCOSE 121* 134*  BUN 10 8  CREATININE 0.76 0.66  CALCIUM 8.9 8.8*  MG 1.8 1.9  PHOS  --  3.2   GFR: Estimated Creatinine Clearance: 79.7 mL/min (by C-G formula based on SCr of 0.66 mg/dL). Liver Function Tests: Recent Labs  Lab 04/01/19 1605 04/02/19 0405  AST 50* 46*  ALT 47* 41  ALKPHOS 97 92  BILITOT 0.3 0.3  PROT 6.8 6.1*  ALBUMIN 3.7 3.3*   No results for input(s): LIPASE, AMYLASE in the last 168 hours. No results for input(s): AMMONIA in the last 168 hours. Coagulation Profile: No results for input(s): INR, PROTIME in the last 168 hours. Cardiac Enzymes: No results for input(s): CKTOTAL, CKMB, CKMBINDEX, TROPONINI in the last 168 hours. BNP (last 3 results) No results for input(s): PROBNP in the last 8760 hours. HbA1C: Recent Labs    04/02/19 0405  HGBA1C 7.0*   CBG: Recent Labs  Lab 04/01/19 1550 04/02/19 0042 04/02/19 0340 04/02/19 0849 04/02/19 1118  GLUCAP 125* 130* 128* 122* 232*   Lipid Profile: No results for  input(s): CHOL, HDL, LDLCALC, TRIG, CHOLHDL, LDLDIRECT in the last 72 hours. Thyroid Function Tests: Recent Labs    04/01/19 1605 04/02/19 0405  TSH 2.352 2.528  FREET4 0.95  --    Anemia Panel: No results for input(s): VITAMINB12, FOLATE, FERRITIN, TIBC, IRON, RETICCTPCT in the last 72 hours. Urine analysis:    Component Value Date/Time   COLORURINE YELLOW 04/01/2019 Home 04/01/2019 1647   LABSPEC 1.003 (L) 04/01/2019 1647   PHURINE 7.0 04/01/2019 1647   GLUCOSEU NEGATIVE 04/01/2019 1647   HGBUR NEGATIVE 04/01/2019 1647   BILIRUBINUR NEGATIVE 04/01/2019 1647   KETONESUR NEGATIVE 04/01/2019 1647   PROTEINUR NEGATIVE 04/01/2019 1647   NITRITE NEGATIVE 04/01/2019 1647   LEUKOCYTESUR NEGATIVE 04/01/2019 1647   Sepsis  Labs: @LABRCNTIP (procalcitonin:4,lacticidven:4)  ) Recent Results (from the past 240 hour(s))  SARS Coronavirus 2 [facility] order, Performed in [facility] hospital lab) Nasopharyngeal Nasopharyngeal Swab     Status: None   Collection Time: 04/01/19  7:00 PM   Specimen: Nasopharyngeal Swab  Result Value Ref Range Status   SARS Coronavirus 2 NEGATIVE NEGATIVE Final    Comment: (NOTE) If result is NEGATIVE SARS-CoV-2 target nucleic acids are NOT DETECTED. The SARS-CoV-2 RNA is generally detectable in upper and lower  respiratory specimens during the acute phase of infection. The lowest  concentration of SARS-CoV-2 viral copies this assay can detect is 250  copies / mL. A negative result does not preclude SARS-CoV-2 infection  and should not be used as the sole basis for treatment or other  patient management decisions.  A negative result may occur with  improper specimen collection / handling, submission of specimen other  than nasopharyngeal swab, presence of viral mutation(s) within the  areas targeted by this assay, and inadequate number of viral copies  (<250 copies / mL). A negative result must be combined with clinical  observations,  patient history, and epidemiological information. If result is POSITIVE SARS-CoV-2 target nucleic acids are DETECTED. The SARS-CoV-2 RNA is generally detectable in upper and lower  respiratory specimens dur ing the acute phase of infection.  Positive  results are indicative of active infection with SARS-CoV-2.  Clinical  correlation with patient history and other diagnostic information is  necessary to determine patient infection status.  Positive results do  not rule out bacterial infection or co-infection with other viruses. If result is PRESUMPTIVE POSTIVE SARS-CoV-2 nucleic acids MAY BE PRESENT.   A presumptive positive result was obtained on the submitted specimen  and confirmed on repeat testing.  While 2019 novel coronavirus  (SARS-CoV-2) nucleic acids may be present in the submitted sample  additional confirmatory testing may be necessary for epidemiological  and / or clinical management purposes  to differentiate between  SARS-CoV-2 and other Sarbecovirus currently known to infect humans.  If clinically indicated additional testing with an alternate test  methodology [phone]) is advised. The SARS-CoV-2 RNA is generally  detectable in upper and lower respiratory sp ecimens during the acute  phase of infection. The expected result is Negative. Fact Sheet for Patients:  StrictlyIdeas.no Fact Sheet for Healthcare Providers: BankingDealers.co.za This test is not yet approved or cleared by the [place] FDA and has been authorized for detection and/or diagnosis of SARS-CoV-2 by FDA under an Emergency Use Authorization (EUA).  This EUA will remain in effect (meaning this test can be used) for the duration of the COVID-19 declaration under Section 564(b)(1) of the Act, 21 U.S.C. section 360bbb-3(b)(1), unless the authorization is terminated or revoked sooner. Performed at [facility], [place] [address], [place], [place]  [address]       Studies: Dg Chest Port 1 View  Result Date: 04/01/2019 CLINICAL DATA:  Weakness. EXAM: PORTABLE CHEST 1 VIEW COMPARISON:  Radiographs of June 27, 2009. FINDINGS: The heart size and mediastinal contours are within normal limits. Both lungs are clear. The visualized skeletal structures are unremarkable. IMPRESSION: No active disease. Electronically Signed   By: Marijo Conception M.D.   On: 04/01/2019 16:19   US Abdomen Limited Ruq  Result Date: 04/01/2019 CLINICAL DATA:  Increased LFTs, prior cholecystectomy EXAM: ULTRASOUND ABDOMEN LIMITED RIGHT UPPER QUADRANT COMPARISON:  February 03, 2009 FINDINGS: Gallbladder: The patient is status post cholecystectomy. No biliary ductal dilation. Common bile duct:  Diameter: 5 mm Liver: No focal lesion identified. Increased echotexture seen throughout. No focal abnormality or biliary ductal dilatation. Portal vein is patent on color Doppler imaging with normal direction of blood flow towards the liver. Other: None. IMPRESSION: Hepatic steatosis. Electronically Signed   By: Prudencio Pair M.D.   On: 04/01/2019 23:38    Scheduled Meds: . citalopram  40 mg Oral Daily  . gabapentin  300 mg Oral TID  . insulin aspart  0-9 Units Subcutaneous Q4H  . levothyroxine  50 mcg Oral Q0600  . methocarbamol  500 mg Oral TID  . mirtazapine  15 mg Oral QHS  . morphine  15 mg Oral TID  . oxybutynin  10 mg Oral QHS  . pantoprazole  40 mg Oral Daily    Continuous Infusions: . sodium chloride 20 mL/hr at 04/02/19 0921  . diltiazem (CARDIZEM) infusion 15 mg/hr (04/02/19 1129)  . heparin 1,150 Units/hr (04/01/19 1851)     LOS: 0 days     Kayleen Memos, MD [facility] Pager [phone]  If 7PM-7AM, please contact night-coverage [email or web address] Password [email or web address] 04/02/2019, 12:23 PM

## 2019-04-02 NOTE — ED Notes (Signed)
SDU ordered bfast 

## 2019-04-02 NOTE — H&P (View-Only) (Signed)
Progress Note  Patient Name: [name] Date of Encounter: 04/02/2019  Primary Cardiologist: Glenetta Hew, MD   Subjective   General weakness and fatigue; mild dyspnea; no CP  Inpatient Medications    Scheduled Meds: . aspirin EC  81 mg Oral QHS  . citalopram  40 mg Oral Daily  . gabapentin  300 mg Oral TID  . insulin aspart  0-9 Units Subcutaneous Q4H  . levothyroxine  50 mcg Oral Q0600  . methocarbamol  500 mg Oral TID  . mirtazapine  15 mg Oral QHS  . morphine  15 mg Oral TID  . oxybutynin  10 mg Oral QHS  . pantoprazole  40 mg Oral Daily   Continuous Infusions: . sodium chloride 75 mL/hr at 04/02/19 0040  . diltiazem (CARDIZEM) infusion 15 mg/hr (04/02/19 [id number])  . heparin 1,150 Units/hr (04/01/19 1851)   PRN Meds: acetaminophen **OR** acetaminophen, HYDROcodone-acetaminophen, morphine injection, ondansetron **OR** ondansetron (ZOFRAN) IV   Vital Signs    Vitals:   04/02/19 0300 04/02/19 0330 04/02/19 0400 04/02/19 0619  BP: (!) 119/56 124/69 108/74 127/83  Pulse: 77 80 (!) 115 93  Resp: 10 15 14 18   Temp:    98 F (36.7 C)  TempSrc:    Oral  SpO2: 97% 98% 99% 98%  Weight:      Height:        Intake/Output Summary (Last 24 hours) at 04/02/2019 0725 Last data filed at 04/02/2019 [id number] Gross per 24 hour  Intake 2083.76 ml  Output 400 ml  Net 1683.76 ml   Last 3 Weights 04/01/2019 03/19/2019 02/19/2019  Weight (lbs) 224 lb 223 lb 225 lb  Weight (kg) 101.606 kg 101.152 kg 102.059 kg      Telemetry    Atrial fibrillation with elevated rate - Personally Reviewed   Physical Exam   GEN: No acute distress.   Neck: No JVD Cardiac: tachycardic, irregular Respiratory: Clear to auscultation bilaterally. GI: Soft, nontender, non-distended  MS: trace edema Neuro:  Nonfocal  Psych: Normal affect   Labs    High Sensitivity Troponin:   Recent Labs  Lab 04/01/19 1605 04/01/19 2010 04/01/19 2154  TROPONINIHS 8 7 8       Chemistry Recent Labs   Lab 04/01/19 1605 04/02/19 0405  NA 138 143  K 3.8 4.2  CL 100 106  CO2 28 28  GLUCOSE 121* 134*  BUN 10 8  CREATININE 0.76 0.66  CALCIUM 8.9 8.8*  PROT 6.8 6.1*  ALBUMIN 3.7 3.3*  AST 50* 46*  ALT 47* 41  ALKPHOS 97 92  BILITOT 0.3 0.3  GFRNONAA >60 >60  GFRAA >60 >60  ANIONGAP 10 9     Hematology Recent Labs  Lab 04/01/19 1605 04/02/19 0405  WBC 6.3 6.1  RBC 4.28 3.81*  HGB 13.1 12.0  HCT 38.9 35.3*  MCV 90.9 92.7  MCH 30.6 31.5  MCHC 33.7 34.0  RDW 12.1 12.2  PLT 242 210     Radiology    Dg Chest Port 1 View  Result Date: 04/01/2019 CLINICAL DATA:  Weakness. EXAM: PORTABLE CHEST 1 VIEW COMPARISON:  Radiographs of June 27, 2009. FINDINGS: The heart size and mediastinal contours are within normal limits. Both lungs are clear. The visualized skeletal structures are unremarkable. IMPRESSION: No active disease. Electronically Signed   By: Marijo Conception M.D.   On: 04/01/2019 16:19   US Abdomen Limited Ruq  Result Date: 04/01/2019 CLINICAL DATA:  Increased LFTs, prior cholecystectomy EXAM: ULTRASOUND ABDOMEN  LIMITED RIGHT UPPER QUADRANT COMPARISON:  February 03, 2009 FINDINGS: Gallbladder: The patient is status post cholecystectomy. No biliary ductal dilation. Common bile duct: Diameter: 5 mm Liver: No focal lesion identified. Increased echotexture seen throughout. No focal abnormality or biliary ductal dilatation. Portal vein is patent on color Doppler imaging with normal direction of blood flow towards the liver. Other: None. IMPRESSION: Hepatic steatosis. Electronically Signed   By: Prudencio Pair M.D.   On: 04/01/2019 23:38    Patient Profile     68 y.o. female with past medical history of fibromyalgia, chronic pain, nonobstructive coronary disease, diabetes mellitus admitted with new onset atrial fibrillation.  Assessment & Plan    1 new onset atrial fibrillation-patient appears to be symptomatic with increased fatigue, weakness and mild dyspnea.  She remains  in atrial fibrillation this morning with mildly elevated rate.  Continue Cardizem. CHADSvasc 4.  Continue IV heparin and will need to transition to apixaban prior to discharge. Note TSH is normal.  We will arrange TEE guided cardioversion tomorrow as there is no room on the schedule today.  I reviewed the electrocardiogram with Dr. Lovena Le and he felt it was coarse atrial fibrillation and therefore we will not refer to EP for consideration of flutter ablation.  If she has more frequent atrial fibrillation in the future we will need to consider antiarrhythmic.  2 hypertension-patient's blood pressure is controlled.  Continue Cardizem and follow.  3 history of nonobstructive coronary disease-discontinue aspirin given need for anticoagulation.  She will need to follow-up with Dr. Ellyn Hack concerning initiation of a statin.  She has an allergy to Crestor by report.  If she is allergic to statins may need to consider Zetia or Repatha pending LDL.  For questions or updates, please contact [place] Please consult [email or web address] for contact info under        Signed, Kirk Ruths, MD  04/02/2019, 7:25 AM

## 2019-04-02 NOTE — Evaluation (Signed)
Physical Therapy Evaluation Patient Details Name: [name] MRN: [id number] DOB: 24-May-1951 Today's Date: 04/02/2019   History of Present Illness  Patient is a 68 y/o female who presents with chest tightness, HA, palpitations. Found to be in A fib with RVR. PMH includes chronic pain, fibromyalgia, sleep-related hypoxia requiring 2 L at night, obesity, nonobstructive CAD, diet-controlled diabetes, depression, HTN.  Clinical Impression  Patient presents with decreased activity tolerance, endurance and impaired mobility s/p above. PT evaluation limited secondary to tachycardia with HR ranging from 90-161 bpm with minimal activity and Echo tech wanting to perform test. Pt is symptomatic with A-fib- diaphoretic. Tolerated short distance ambulation in room and transfer to chair with close min guard for safety. Pt independent PTA and lives with spouse. Will likely improve once HR is better controlled. Will follow acutely to maximize independence and mobility prior to return home.    Follow Up Recommendations No PT follow up;Supervision - Intermittent    Equipment Recommendations  None recommended by PT    Recommendations for Other Services       Precautions / Restrictions Precautions Precautions: Other (comment);Fall Precaution Comments: watch HR, dizziness Restrictions Weight Bearing Restrictions: No      Mobility  Bed Mobility Overal bed mobility: Needs Assistance Bed Mobility: Supine to Sit;Sit to Supine     Supine to sit: Modified independent (Device/Increase time);HOB elevated Sit to supine: Modified independent (Device/Increase time);HOB elevated   General bed mobility comments: Performed x2 with HOB slightly elevated.  Transfers Overall transfer level: Needs assistance Equipment used: None Transfers: Sit to/from Stand Sit to Stand: Min guard         General transfer comment: Min guard for safety. Stood from [employer], from chair x1. After pt transferred to chair and  changed gown, ECHO tech arrived to pt returned to bed.  Ambulation/Gait Ambulation/Gait assistance: Min guard Gait Distance (Feet): 20 Feet Assistive device: None Gait Pattern/deviations: Step-through pattern;Decreased stride length Gait velocity: decreased   General Gait Details: Slow, steady gait holding onto end of bed rail to get to chair. HR 90-161 bpm. Pt diaphoretic from just finishing a bath earlier and due to elevated HR, changed gown.  Stairs            Wheelchair Mobility    Modified Rankin (Stroke Patients Only)       Balance Overall balance assessment: Mild deficits observed, not formally tested                                           Pertinent Vitals/Pain Pain Assessment: No/denies pain    Home Living Family/patient expects to be discharged to:: Private residence Living Arrangements: Spouse/significant other Available Help at Discharge: Family Type of Home: House Home Access: Stairs to enter   [employer] of Steps: 1          Prior Function Level of Independence: Independent         Comments: Independent with ADLs but hires someone to help with cleaning. Drives.     Hand Dominance        Extremity/Trunk Assessment   Upper Extremity Assessment Upper Extremity Assessment: Defer to OT evaluation    Lower Extremity Assessment Lower Extremity Assessment: Overall WFL for tasks assessed    Cervical / Trunk Assessment Cervical / Trunk Assessment: Normal  Communication   Communication: No difficulties  Cognition Arousal/Alertness: Awake/alert Behavior During Therapy: [facility]  for tasks assessed/performed Overall Cognitive Status: Within Functional Limits for tasks assessed                                        General Comments General comments (skin integrity, edema, etc.): BP stable. HR 90-161 bpm A-fib. Pt diaphoretic due to HR being elevated.    Exercises     Assessment/Plan    PT  Assessment Patient needs continued PT services  PT Problem List Decreased mobility;Cardiopulmonary status limiting activity;Decreased balance       PT Treatment Interventions Gait training;Therapeutic activities;Patient/family education;Balance training;Functional mobility training    PT Goals (Current goals can be found in the Care Plan section)  Acute Rehab PT Goals Patient Stated Goal: to get better PT Goal Formulation: With patient Time For Goal Achievement: 04/16/19 Potential to Achieve Goals: Good    Frequency Min 3X/week   Barriers to discharge        Co-evaluation               AM-PAC PT "6 Clicks" Mobility  Outcome Measure Help needed turning from your back to your side while in a flat bed without using bedrails?: None Help needed moving from lying on your back to sitting on the side of a flat bed without using bedrails?: None Help needed moving to and from a bed to a chair (including a wheelchair)?: A Little Help needed standing up from a chair using your arms (e.g., wheelchair or bedside chair)?: A Little Help needed to walk in hospital room?: A Little Help needed climbing 3-5 steps with a railing? : A Little 6 Click Score: 20    End of Session   Activity Tolerance: Treatment limited secondary to medical complications (Comment)(tachycardia) Patient left: in bed;with call bell/phone within reach;Other (comment)(with ECHO tech) Nurse Communication: Mobility status PT Visit Diagnosis: Difficulty in walking, not elsewhere classified (R26.2)    Time: [id number] PT Time Calculation (min) (ACUTE ONLY): 14 min   Charges:   PT Evaluation $PT Eval Moderate Complexity: 1 Mod          Wray Kearns, PT, DPT Acute Rehabilitation Services Pager [phone] Office [phone]      Marguarite Arbour A Sabra Heck 04/02/2019, 3:39 PM

## 2019-04-02 NOTE — Progress Notes (Addendum)
Progress Note  Patient Name: [name] Date of Encounter: 04/02/2019  Primary Cardiologist: Glenetta Hew, MD   Subjective   General weakness and fatigue; mild dyspnea; no CP  Inpatient Medications    Scheduled Meds: . aspirin EC  81 mg Oral QHS  . citalopram  40 mg Oral Daily  . gabapentin  300 mg Oral TID  . insulin aspart  0-9 Units Subcutaneous Q4H  . levothyroxine  50 mcg Oral Q0600  . methocarbamol  500 mg Oral TID  . mirtazapine  15 mg Oral QHS  . morphine  15 mg Oral TID  . oxybutynin  10 mg Oral QHS  . pantoprazole  40 mg Oral Daily   Continuous Infusions: . sodium chloride 75 mL/hr at 04/02/19 0040  . diltiazem (CARDIZEM) infusion 15 mg/hr (04/02/19 [id number])  . heparin 1,150 Units/hr (04/01/19 1851)   PRN Meds: acetaminophen **OR** acetaminophen, HYDROcodone-acetaminophen, morphine injection, ondansetron **OR** ondansetron (ZOFRAN) IV   Vital Signs    Vitals:   04/02/19 0300 04/02/19 0330 04/02/19 0400 04/02/19 0619  BP: (!) 119/56 124/69 108/74 127/83  Pulse: 77 80 (!) 115 93  Resp: 10 15 14 18   Temp:    98 F (36.7 C)  TempSrc:    Oral  SpO2: 97% 98% 99% 98%  Weight:      Height:        Intake/Output Summary (Last 24 hours) at 04/02/2019 0725 Last data filed at 04/02/2019 [id number] Gross per 24 hour  Intake 2083.76 ml  Output 400 ml  Net 1683.76 ml   Last 3 Weights 04/01/2019 03/19/2019 02/19/2019  Weight (lbs) 224 lb 223 lb 225 lb  Weight (kg) 101.606 kg 101.152 kg 102.059 kg      Telemetry    Atrial fibrillation with elevated rate - Personally Reviewed   Physical Exam   GEN: No acute distress.   Neck: No JVD Cardiac: tachycardic, irregular Respiratory: Clear to auscultation bilaterally. GI: Soft, nontender, non-distended  MS: trace edema Neuro:  Nonfocal  Psych: Normal affect   Labs    High Sensitivity Troponin:   Recent Labs  Lab 04/01/19 1605 04/01/19 2010 04/01/19 2154  TROPONINIHS 8 7 8       Chemistry Recent Labs   Lab 04/01/19 1605 04/02/19 0405  NA 138 143  K 3.8 4.2  CL 100 106  CO2 28 28  GLUCOSE 121* 134*  BUN 10 8  CREATININE 0.76 0.66  CALCIUM 8.9 8.8*  PROT 6.8 6.1*  ALBUMIN 3.7 3.3*  AST 50* 46*  ALT 47* 41  ALKPHOS 97 92  BILITOT 0.3 0.3  GFRNONAA >60 >60  GFRAA >60 >60  ANIONGAP 10 9     Hematology Recent Labs  Lab 04/01/19 1605 04/02/19 0405  WBC 6.3 6.1  RBC 4.28 3.81*  HGB 13.1 12.0  HCT 38.9 35.3*  MCV 90.9 92.7  MCH 30.6 31.5  MCHC 33.7 34.0  RDW 12.1 12.2  PLT 242 210     Radiology    Dg Chest Port 1 View  Result Date: 04/01/2019 CLINICAL DATA:  Weakness. EXAM: PORTABLE CHEST 1 VIEW COMPARISON:  Radiographs of June 27, 2009. FINDINGS: The heart size and mediastinal contours are within normal limits. Both lungs are clear. The visualized skeletal structures are unremarkable. IMPRESSION: No active disease. Electronically Signed   By: Marijo Conception M.D.   On: 04/01/2019 16:19   US Abdomen Limited Ruq  Result Date: 04/01/2019 CLINICAL DATA:  Increased LFTs, prior cholecystectomy EXAM: ULTRASOUND ABDOMEN  LIMITED RIGHT UPPER QUADRANT COMPARISON:  February 03, 2009 FINDINGS: Gallbladder: The patient is status post cholecystectomy. No biliary ductal dilation. Common bile duct: Diameter: 5 mm Liver: No focal lesion identified. Increased echotexture seen throughout. No focal abnormality or biliary ductal dilatation. Portal vein is patent on color Doppler imaging with normal direction of blood flow towards the liver. Other: None. IMPRESSION: Hepatic steatosis. Electronically Signed   By: Prudencio Pair M.D.   On: 04/01/2019 23:38    Patient Profile     68 y.o. female with past medical history of fibromyalgia, chronic pain, nonobstructive coronary disease, diabetes mellitus admitted with new onset atrial fibrillation.  Assessment & Plan    1 new onset atrial fibrillation-patient appears to be symptomatic with increased fatigue, weakness and mild dyspnea.  She remains  in atrial fibrillation this morning with mildly elevated rate.  Continue Cardizem. CHADSvasc 4.  Continue IV heparin and will need to transition to apixaban prior to discharge. Note TSH is normal.  We will arrange TEE guided cardioversion tomorrow as there is no room on the schedule today.  I reviewed the electrocardiogram with Dr. Lovena Le and he felt it was coarse atrial fibrillation and therefore we will not refer to EP for consideration of flutter ablation.  If she has more frequent atrial fibrillation in the future we will need to consider antiarrhythmic.  2 hypertension-patient's blood pressure is controlled.  Continue Cardizem and follow.  3 history of nonobstructive coronary disease-discontinue aspirin given need for anticoagulation.  She will need to follow-up with Dr. Ellyn Hack concerning initiation of a statin.  She has an allergy to Crestor by report.  If she is allergic to statins may need to consider Zetia or Repatha pending LDL.  For questions or updates, please contact [place] Please consult [email or web address] for contact info under        Signed, Kirk Ruths, MD  04/02/2019, 7:25 AM

## 2019-04-02 NOTE — Progress Notes (Signed)
Left lower ext venous  has been completed. Refer to [facility] under chart review to view preliminary results.   04/02/2019  2:13 PM Margert Edsall, Bonnye Fava

## 2019-04-03 ENCOUNTER — Encounter (HOSPITAL_COMMUNITY): Payer: Self-pay | Admitting: *Deleted

## 2019-04-03 ENCOUNTER — Inpatient Hospital Stay (HOSPITAL_COMMUNITY): Payer: Medicare Other | Admitting: Anesthesiology

## 2019-04-03 ENCOUNTER — Encounter (HOSPITAL_COMMUNITY)
Admission: EM | Disposition: A | Discharge status: Home / Self Care | Payer: Self-pay | Source: Home / Self Care | Attending: Internal Medicine

## 2019-04-03 ENCOUNTER — Other Ambulatory Visit: Payer: Self-pay

## 2019-04-03 ENCOUNTER — Inpatient Hospital Stay (HOSPITAL_COMMUNITY): Payer: Medicare Other

## 2019-04-03 DIAGNOSIS — E785 Hyperlipidemia, unspecified: Secondary | ICD-10-CM | POA: Diagnosis present

## 2019-04-03 DIAGNOSIS — G4733 Obstructive sleep apnea (adult) (pediatric): Secondary | ICD-10-CM | POA: Diagnosis present

## 2019-04-03 DIAGNOSIS — I340 Nonrheumatic mitral (valve) insufficiency: Secondary | ICD-10-CM | POA: Diagnosis not present

## 2019-04-03 DIAGNOSIS — I4891 Unspecified atrial fibrillation: Secondary | ICD-10-CM | POA: Diagnosis present

## 2019-04-03 DIAGNOSIS — E669 Obesity, unspecified: Secondary | ICD-10-CM | POA: Diagnosis present

## 2019-04-03 DIAGNOSIS — I4430 Unspecified atrioventricular block: Secondary | ICD-10-CM | POA: Diagnosis present

## 2019-04-03 DIAGNOSIS — I2510 Atherosclerotic heart disease of native coronary artery without angina pectoris: Secondary | ICD-10-CM | POA: Diagnosis not present

## 2019-04-03 DIAGNOSIS — G8929 Other chronic pain: Secondary | ICD-10-CM | POA: Diagnosis present

## 2019-04-03 DIAGNOSIS — K219 Gastro-esophageal reflux disease without esophagitis: Secondary | ICD-10-CM | POA: Diagnosis present

## 2019-04-03 DIAGNOSIS — Z20828 Contact with and (suspected) exposure to other viral communicable diseases: Secondary | ICD-10-CM | POA: Diagnosis present

## 2019-04-03 DIAGNOSIS — E782 Mixed hyperlipidemia: Secondary | ICD-10-CM | POA: Diagnosis not present

## 2019-04-03 DIAGNOSIS — I10 Essential (primary) hypertension: Secondary | ICD-10-CM | POA: Diagnosis not present

## 2019-04-03 DIAGNOSIS — I361 Nonrheumatic tricuspid (valve) insufficiency: Secondary | ICD-10-CM

## 2019-04-03 DIAGNOSIS — M797 Fibromyalgia: Secondary | ICD-10-CM | POA: Diagnosis present

## 2019-04-03 DIAGNOSIS — E119 Type 2 diabetes mellitus without complications: Secondary | ICD-10-CM | POA: Diagnosis not present

## 2019-04-03 DIAGNOSIS — Z8249 Family history of ischemic heart disease and other diseases of the circulatory system: Secondary | ICD-10-CM | POA: Diagnosis not present

## 2019-04-03 DIAGNOSIS — Z803 Family history of malignant neoplasm of breast: Secondary | ICD-10-CM | POA: Diagnosis not present

## 2019-04-03 DIAGNOSIS — Z825 Family history of asthma and other chronic lower respiratory diseases: Secondary | ICD-10-CM | POA: Diagnosis not present

## 2019-04-03 DIAGNOSIS — E039 Hypothyroidism, unspecified: Secondary | ICD-10-CM | POA: Diagnosis not present

## 2019-04-03 DIAGNOSIS — Z833 Family history of diabetes mellitus: Secondary | ICD-10-CM | POA: Diagnosis not present

## 2019-04-03 DIAGNOSIS — Z981 Arthrodesis status: Secondary | ICD-10-CM | POA: Diagnosis not present

## 2019-04-03 DIAGNOSIS — I4892 Unspecified atrial flutter: Secondary | ICD-10-CM | POA: Diagnosis not present

## 2019-04-03 DIAGNOSIS — Z888 Allergy status to other drugs, medicaments and biological substances status: Secondary | ICD-10-CM | POA: Diagnosis not present

## 2019-04-03 DIAGNOSIS — I272 Pulmonary hypertension, unspecified: Secondary | ICD-10-CM | POA: Diagnosis not present

## 2019-04-03 DIAGNOSIS — Z7982 Long term (current) use of aspirin: Secondary | ICD-10-CM | POA: Diagnosis not present

## 2019-04-03 DIAGNOSIS — I083 Combined rheumatic disorders of mitral, aortic and tricuspid valves: Secondary | ICD-10-CM | POA: Diagnosis not present

## 2019-04-03 DIAGNOSIS — Z9981 Dependence on supplemental oxygen: Secondary | ICD-10-CM | POA: Diagnosis not present

## 2019-04-03 DIAGNOSIS — Z881 Allergy status to other antibiotic agents status: Secondary | ICD-10-CM | POA: Diagnosis not present

## 2019-04-03 DIAGNOSIS — Z6836 Body mass index (BMI) 36.0-36.9, adult: Secondary | ICD-10-CM | POA: Diagnosis not present

## 2019-04-03 DIAGNOSIS — R7989 Other specified abnormal findings of blood chemistry: Secondary | ICD-10-CM | POA: Diagnosis present

## 2019-04-03 HISTORY — PX: CARDIOVERSION: SHX1299

## 2019-04-03 HISTORY — PX: TEE WITHOUT CARDIOVERSION: SHX5443

## 2019-04-03 LAB — GLUCOSE, CAPILLARY
Glucose-Capillary: 117 mg/dL — ABNORMAL HIGH (ref 70–99)
Glucose-Capillary: 126 mg/dL — ABNORMAL HIGH (ref 70–99)
Glucose-Capillary: 152 mg/dL — ABNORMAL HIGH (ref 70–99)
Glucose-Capillary: 154 mg/dL — ABNORMAL HIGH (ref 70–99)
Glucose-Capillary: 157 mg/dL — ABNORMAL HIGH (ref 70–99)
Glucose-Capillary: 158 mg/dL — ABNORMAL HIGH (ref 70–99)
Glucose-Capillary: 247 mg/dL — ABNORMAL HIGH (ref 70–99)

## 2019-04-03 LAB — CBC
HCT: 35 % — ABNORMAL LOW (ref 36.0–46.0)
Hemoglobin: 12.2 g/dL (ref 12.0–15.0)
MCH: 31.4 pg (ref 26.0–34.0)
MCHC: 34.9 g/dL (ref 30.0–36.0)
MCV: 90.2 fL (ref 80.0–100.0)
Platelets: 231 10*3/uL (ref 150–400)
RBC: 3.88 MIL/uL (ref 3.87–5.11)
RDW: 12.2 % (ref 11.5–15.5)
WBC: 6.2 10*3/uL (ref 4.0–10.5)
nRBC: 0 % (ref 0.0–0.2)

## 2019-04-03 LAB — HEPARIN LEVEL (UNFRACTIONATED)
Heparin Unfractionated: 0.24 IU/mL — ABNORMAL LOW (ref 0.30–0.70)
Heparin Unfractionated: 0.39 IU/mL (ref 0.30–0.70)

## 2019-04-03 SURGERY — ECHOCARDIOGRAM, TRANSESOPHAGEAL
Anesthesia: General

## 2019-04-03 MED ORDER — PROPOFOL 10 MG/ML IV BOLUS
INTRAVENOUS | Status: DC | PRN
  Administered 2019-04-03: 60 mg via INTRAVENOUS
  Administered 2019-04-03: 20 mg via INTRAVENOUS

## 2019-04-03 MED ORDER — APIXABAN 5 MG PO TABS
5.0000 mg | ORAL_TABLET | Freq: Two times a day (BID) | ORAL | Status: DC
  Administered 2019-04-03 – 2019-04-04 (×3): 5 mg via ORAL
  Filled 2019-04-03 (×3): qty 1

## 2019-04-03 MED ORDER — PROPOFOL 500 MG/50ML IV EMUL
INTRAVENOUS | Status: DC | PRN
  Administered 2019-04-03: 75 ug/kg/min via INTRAVENOUS

## 2019-04-03 MED ORDER — PHENYLEPHRINE HCL (PRESSORS) 10 MG/ML IV SOLN
INTRAVENOUS | Status: DC | PRN
  Administered 2019-04-03 (×3): 80 ug via INTRAVENOUS

## 2019-04-03 MED ORDER — LIDOCAINE HCL (CARDIAC) PF 100 MG/5ML IV SOSY
PREFILLED_SYRINGE | INTRAVENOUS | Status: DC | PRN
  Administered 2019-04-03: 30 mg via INTRATRACHEAL

## 2019-04-03 MED ORDER — LACTATED RINGERS IV SOLN: INTRAVENOUS | Status: DC | PRN

## 2019-04-03 MED ORDER — SODIUM CHLORIDE 0.9 % IV SOLN
INTRAVENOUS | Status: DC | PRN
  Administered 2019-04-03 (×2): via INTRAVENOUS

## 2019-04-03 NOTE — Anesthesia Postprocedure Evaluation (Signed)
Anesthesia Post Note  Patient: [name]  Procedure(s) Performed: TRANSESOPHAGEAL ECHOCARDIOGRAM (TEE) (N/A ) CARDIOVERSION (N/A )     Patient location during evaluation: Endoscopy Anesthesia Type: General Level of consciousness: awake and alert Pain management: pain level controlled Vital Signs Assessment: post-procedure vital signs reviewed and stable Respiratory status: spontaneous breathing, nonlabored ventilation, respiratory function stable and patient connected to nasal cannula oxygen Cardiovascular status: blood pressure returned to baseline and stable Postop Assessment: no apparent nausea or vomiting Anesthetic complications: no    Last Vitals:  Vitals:   04/03/19 1030 04/03/19 1051  BP: (!) 114/45 (!) 121/51  Pulse: 79   Resp: 14   Temp:    SpO2: 98% 95%    Last Pain:  Vitals:   04/03/19 1051  TempSrc:   PainSc: 0-No pain                 Blaise Palladino L Jaeshawn Silvio

## 2019-04-03 NOTE — CV Procedure (Signed)
     Transesophageal Echocardiogram Note  NEELI WHITLING [id number] Mar 29, 1951  Procedure: Transesophageal Echocardiogram Indications: atrial flutter with 2:1 block  Procedure Details Consent: Obtained Time Out: Verified patient identification, verified procedure, site/side was marked, verified correct patient position, special equipment/implants available, Radiology Safety Procedures followed,  medications/allergies/relevent history reviewed, required imaging and test results available.  Performed  Medications: Propofol administered by anesthesia staff  Left Ventrical:  LVEF 55-60%  Mitral Valve: mild MR  Aortic Valve: mild AI  Tricuspid Valve: mild TR  Pulmonic Valve: no PR  Left Atrium/ Left atrial appendage: no thrombus, decreased velocities  Atrial septum: no PFO  Aorta: mild atherosclerotic plaque   Complications: No apparent complications Patient did tolerate procedure well.  [name], MD, [facility] 04/03/2019, 10:03 AM     Cardioversion Note  GAYLEEN BLANKLEY [id number] 10-Jan-1951  Procedure: DC Cardioversion Indications: atrial flutter with 2:1 conduction  Procedure Details Consent: Obtained Time Out: Verified patient identification, verified procedure, site/side was marked, verified correct patient position, special equipment/implants available, Radiology Safety Procedures followed,  medications/allergies/relevent history reviewed, required imaging and test results available.  Performed  The patient has been on adequate anticoagulation.  The patient received IV propofol administered by anesthesia staff for sedation.  Synchronous cardioversion was performed at 120 joules.  The cardioversion was successful   Complications: No apparent complications Patient did tolerate procedure well.   [name], MD, [facility] 04/03/2019, 10:03 AM

## 2019-04-03 NOTE — Discharge Instructions (Addendum)

## 2019-04-03 NOTE — Transfer of Care (Signed)
Immediate Anesthesia Transfer of Care Note  Patient: [name]  Procedure(s) Performed: TRANSESOPHAGEAL ECHOCARDIOGRAM (TEE) (N/A ) CARDIOVERSION (N/A )  Patient Location: Endoscopy Unit  Anesthesia Type:General  Level of Consciousness: awake, alert  and oriented  Airway & Oxygen Therapy: Patient Spontanous Breathing and Patient connected to nasal cannula oxygen  Post-op Assessment: Report given to RN, Post -op Vital signs reviewed and stable, Patient moving all extremities X 4 and Patient able to stick tongue midline  Post vital signs: Reviewed and stable  Last Vitals:  Vitals Value Taken Time  BP    Temp    Pulse 91 04/03/19 1004  Resp 10 04/03/19 1004  SpO2 98 % 04/03/19 1004  Vitals shown include unvalidated device data.  Last Pain:  Vitals:   04/03/19 0846  TempSrc: Temporal  PainSc: 0-No pain      Patients Stated Pain Goal: 3 ([id number])  Complications: No apparent anesthesia complications

## 2019-04-03 NOTE — Progress Notes (Signed)
[place] for Heparin Indication: atrial fibrillation  Allergies  Allergen Reactions  . Rizatriptan Benzoate Anaphylaxis    Throat and Tongue Swelling Closed  . Sumatriptan Other (See Comments)    Throat closed & Tongue Swelling  . Tizanidine Shortness Of Breath  . Aleve [Naproxen Sodium] Hives  . Clarithromycin Hives  . Cyclobenzaprine Hcl     REACTION: THROAT CLOSED,TOUNGE SWELLING  . Doxycycline Diarrhea and Nausea And Vomiting    Reaction: unknown  . Duloxetine Nausea Only  . Keflex [Cephalexin] Itching    Sores  . Metronidazole Diarrhea and Nausea Only  . Oxycodone Hcl Diarrhea    Vomiting  . Oxycontin [Oxycodone Hcl] Nausea And Vomiting  . Penicillins     Reaction: unknown Has patient had a PCN reaction causing immediate rash, facial/tongue/throat swelling, SOB or lightheadedness with hypotension: {unknown Has patient had a PCN reaction causing severe rash involving mucus membranes or skin necrosis: {unknown Has patient had a PCN reaction that required hospitalization {unknown Has patient had a PCN reaction occurring within the last 10 years: {no If all of the above answers are "NO", then may proceed with Cephalosporin use.  . Rosuvastatin     REACTION: MUSCLE ACHES  . Septra [Sulfamethoxazole-Trimethoprim] Other (See Comments)    Rash and itching  . Venlafaxine Hives  . Voltaren [Diclofenac Sodium] Hives    oral  . Tramadol Diarrhea, Nausea And Vomiting and Palpitations    Patient Measurements: Height: 5\' 5"  (165.1 cm) Weight: 219 lb 9.6 oz (99.6 kg) IBW/kg (Calculated) : 57 Heparin Dosing Weight: 80.4  Vital Signs: Temp: 98.4 F (36.9 C) (10/07 0027) Temp Source: Oral (10/07 0027) BP: 119/56 (10/07 0027) Pulse Rate: 110 (10/06 2100)  Labs: Recent Labs    04/01/19 1605 04/01/19 2010 04/01/19 2154 04/02/19 0405 04/02/19 0950 04/03/19 0258  HGB 13.1  --   --  12.0  --  12.2  HCT 38.9  --   --  35.3*  --  35.0*   PLT 242  --   --  210  --  231  HEPARINUNFRC  --   --   --  0.32 0.30 0.24*  CREATININE 0.76  --   --  0.66  --   --   TROPONINIHS 8 7 8   --   --   --     Estimated Creatinine Clearance: 79.7 mL/min (by C-G formula based on SCr of 0.66 mg/dL).   Assessment: 68 yo F with a chief complaints of feeling weak. She called her family physician who told her to go to urgent care.  Upon arrival there it was noted that her heart rate was in the 150s and she was sent here for evaluation. Pharmacy consulted to start heparin for atrial fibrillation.  Heparin level came back subtherapeutic this morning at 0.24, on 1200 units/hr. Hgb 12.2, plt 231. No infusion issues or s/sx of bleeding per RN - was paused ~2100 10/6 in order to change IV site.   Goal of Therapy:  Heparin level 0.3-0.7 units/ml Monitor platelets by anticoagulation protocol: Yes   Plan:  -Increase heparin to 1350 units/hr -Get confirmatory level in 6 hr  -Daily heparin level and CBC  Antonietta Jewel, PharmD, BCCCP Clinical Pharmacist   Please check AMION for all [place] phone numbers After 10:00 PM, call [place] [phone] Please check AMION for all [place] numbers 04/03/2019

## 2019-04-03 NOTE — Interval H&P Note (Signed)
History and Physical Interval Note:  04/03/2019 7:45 AM  [name]  has presented today for surgery, with the diagnosis of aflutter.  The various methods of treatment have been discussed with the patient and family. After consideration of risks, benefits and other options for treatment, the patient has consented to  Procedure(s): TRANSESOPHAGEAL ECHOCARDIOGRAM (TEE) (N/A) CARDIOVERSION (N/A) as a surgical intervention.  The patient's history has been reviewed, patient examined, no change in status, stable for surgery.  I have reviewed the patient's chart and labs.  Questions were answered to the patient's satisfaction.     [name]

## 2019-04-03 NOTE — Progress Notes (Signed)
PT Cancellation Note  Patient Details Name: [name] MRN: [id number] DOB: 09-08-1950   Cancelled Treatment:    Reason Eval/Treat Not Completed: Patient at procedure or test/unavailable   At TEE;  Will follow up later today as time allows;  Otherwise, will follow up for PT tomorrow;   Thank you,  Roney Marion, [place] Pager [phone] Office [place] 04/03/2019, 8:30 AM

## 2019-04-03 NOTE — Progress Notes (Signed)
PROGRESS NOTE    [name]  [id number] DOB: 02-02-1951 DOA: 04/01/2019 PCP: Owens Loffler, MD (Confirm with patient/family/NH records and if not entered, this HAS to be entered at [facility] point of entry. "No PCP" if truly none.)   Brief Narrative:  68 y.o.femalewith medical history significant of chronic pain, fibromyalgia, sleep-related hypoxia requiring 2 L at night, obesity, nonobstructive CAD, diet-controlled diabetes.   Presented with9 dayshistory of fatigue, lightheadafter experiencing diarrhea 1 weeks ago lasted 2 days since thenshe has been feeling some palpitations, headache,and chest tightness she presented to her urgent care on 10/6 and was found to be tachycardic into the 150s and was sent to emergency department where she was found to be in a flutter with initial rates of 150s started on IV diltiazem after administration of a bolus cardiology was consulted. Patient has no prior history of this denies any history of bleeding not apparent on anticoagulation Reported severe central chest pain with associated belching and burping prior to presentation, and had some discomfort while in ER that was not HR dependent, not worse with inspiration, and was non-positional.  Described as dull aching.  No recent change in medications.   Denies any fevers or chills no cough no shortness of breath.  No sick contacts.  No COVID exposure has been isolating at home.  Cardiology performed TEE/cardioversion today which was successful.      Assessment & Plan:   Principal Problem:   Atrial fibrillation with RVR (HCC) Active Problems:   Essential hypertension   TRANSAMINASES, SERUM, ELEVATED   Hypothyroidism   HYPERLIPIDEMIA   GERD   Obesity (BMI 30-39.9)   Diabetes mellitus type 2, diet-controlled ([place])   Flutter-fibrillation ([place])   New onset atrial fibrillation with RVR - New diagnosis.  CHADS-VASc 4.  TSH normal. Heart rate in 150s on presentation.  Started on heparin drip  and Cardizem drip per cardiology.  Underwent TEE with cardioversion today 04/03/19, which was successful.  Echo showed EF of 55 to 60%.  Blood pressures were soft on Cardizem, will monitor.   - Continue heparin drip - Starting Eliquis.   - monitor BP, maintain map > 65  Essential hypertension, currently with soft BP, should improve off Cardizem.  No outpatient blood pressure medications noted. - monitor  Hypothyroidism - TSH normal - Continue levothyroxine  History of nonobstructive coronary artery disease - Discontinued aspirin per cardiology - will need follow-up with Dr. Ellyn Hack regarding statin initiation - Allergy to Crestor per chart.  Hyperlipidemia-not on statin (as above)  GERD-only home med appears to be PRN Tums.  Chronic pain-secondary to fibromyalgia, failed foot surgery.  We will continue home pain meds.  Patient currently not reporting any pain.  Type 2 diabetes, diet controlled-sliding scale insulin ordered  Obstructive sleep apnea-on chronic nocturnal oxygen at home, will continue  Obesity (BMI 30-39.9)-chronic, follow-up as outpatient   DVT prophylaxis: on therapeutic heparin Code Status: Full code Family Communication: none at bedside Disposition Plan: likely home tomorrow, pending cardiology clearance.   Consultants:   Cariology  Procedures:  TEE & Cardioversion 04/03/19 - EF 55 to 60%.   Antimicrobials:   none    Subjective: Patient seen and examined after cardioversion procedure.  Reports complete resolution of her symptoms, and denies chest pain, shortness of breath or other complaints.  No acute events reported overnight.  Denies fever/chills, cough or recent illness.   Objective: Vitals:   04/03/19 1020 04/03/19 1030 04/03/19 1051 04/03/19 1355  BP: (!) 106/39 (!) 114/45 Marland Kitchen)  121/51 (!) 118/49  Pulse: 85 79  81  Resp: 14 14    Temp:    98.2 F (36.8 C)  TempSrc:    Oral  SpO2: 95% 98% 95% 95%  Weight:      Height:         Intake/Output Summary (Last 24 hours) at 04/03/2019 1450 Last data filed at 04/03/2019 1000 Gross per 24 hour  Intake 1064.63 ml  Output 2100 ml  Net -1035.37 ml   Filed Weights   04/01/19 1551 04/02/19 0619 04/03/19 0426  Weight: 101.6 kg 99.6 kg 98.7 kg    Examination:  General exam: Appears calm and comfortable, pleasant & conversational Respiratory system: Clear to auscultation. Respiratory effort normal. Cardiovascular system: S1 & S2 heard, RRR. No JVD, murmurs, rubs, gallops or clicks. No pedal edema. Gastrointestinal system: Abdomen is nondistended, soft and nontender. No organomegaly or masses felt. Normal bowel sounds heard. Central nervous system: Alert and oriented. No focal neurological deficits. Extremities: L > R lower extremity nonpitting edema Skin: dry and intact, no rashes, good turgor Psychiatry: Judgement and insight appear normal. Mood & affect appropriate.     Data Reviewed: I have personally reviewed following labs and imaging studies  CBC: Recent Labs  Lab 04/01/19 1605 04/02/19 0405 04/03/19 0258  WBC 6.3 6.1 6.2  NEUTROABS 3.7  --   --   HGB 13.1 12.0 12.2  HCT 38.9 35.3* 35.0*  MCV 90.9 92.7 90.2  PLT 242 210 [id number]   Basic Metabolic Panel: Recent Labs  Lab 04/01/19 1605 04/02/19 0405  NA 138 143  K 3.8 4.2  CL 100 106  CO2 28 28  GLUCOSE 121* 134*  BUN 10 8  CREATININE 0.76 0.66  CALCIUM 8.9 8.8*  MG 1.8 1.9  PHOS  --  3.2   GFR: Estimated Creatinine Clearance: 79.4 mL/min (by C-G formula based on SCr of 0.66 mg/dL). Liver Function Tests: Recent Labs  Lab 04/01/19 1605 04/02/19 0405  AST 50* 46*  ALT 47* 41  ALKPHOS 97 92  BILITOT 0.3 0.3  PROT 6.8 6.1*  ALBUMIN 3.7 3.3*   No results for input(s): LIPASE, AMYLASE in the last 168 hours. No results for input(s): AMMONIA in the last 168 hours. Coagulation Profile: No results for input(s): INR, PROTIME in the last 168 hours. Cardiac Enzymes: No results for input(s):  CKTOTAL, CKMB, CKMBINDEX, TROPONINI in the last 168 hours. BNP (last 3 results) No results for input(s): PROBNP in the last 8760 hours. HbA1C: Recent Labs    04/02/19 0405  HGBA1C 7.0*   CBG: Recent Labs  Lab 04/02/19 2104 04/03/19 0021 04/03/19 0429 04/03/19 0741 04/03/19 1120  GLUCAP 175* 152* 158* 126* 154*   Lipid Profile: No results for input(s): CHOL, HDL, LDLCALC, TRIG, CHOLHDL, LDLDIRECT in the last 72 hours. Thyroid Function Tests: Recent Labs    04/01/19 1605 04/02/19 0405  TSH 2.352 2.528  FREET4 0.95  --    Anemia Panel: No results for input(s): VITAMINB12, FOLATE, FERRITIN, TIBC, IRON, RETICCTPCT in the last 72 hours. Sepsis Labs: No results for input(s): PROCALCITON, LATICACIDVEN in the last 168 hours.  Recent Results (from the past 240 hour(s))  Novel Coronavirus, NAA ([facility])     Status: None   Collection Time: 04/01/19  2:26 PM   Specimen: Nasal Swab; Nasopharyngeal(NP) swabs in vial transport medium   NASOPHARYNGE  Result Value Ref Range Status   SARS-CoV-2, NAA Not Detected Not Detected Final    Comment: Testing was performed using  the cobas(R) SARS-CoV-2 test. This nucleic acid amplification test was developed and its performance characteristics determined by [employer]. Nucleic acid amplification tests include PCR and TMA. This test has not been FDA cleared or approved. This test has been authorized by FDA under an Emergency Use Authorization (EUA). This test is only authorized for the duration of time the declaration that circumstances exist justifying the authorization of the emergency use of in vitro diagnostic tests for detection of SARS-CoV-2 virus and/or diagnosis of COVID-19 infection under section 564(b)(1) of the Act, 21 U.S.C. [id number]) (1), unless the authorization is terminated or revoked sooner. When diagnostic testing is negative, the possibility of a false negative result should be considered in the context of a  patient's recent exposures and the presence of clinical signs and symptoms consistent with COVID-19. An individual without symptoms  of COVID-19 and who is not shedding SARS-CoV-2 virus would expect to have a negative (not detected) result in this assay.   SARS Coronavirus 2 [facility] order, Performed in [facility] hospital lab) Nasopharyngeal Nasopharyngeal Swab     Status: None   Collection Time: 04/01/19  7:00 PM   Specimen: Nasopharyngeal Swab  Result Value Ref Range Status   SARS Coronavirus 2 NEGATIVE NEGATIVE Final    Comment: (NOTE) If result is NEGATIVE SARS-CoV-2 target nucleic acids are NOT DETECTED. The SARS-CoV-2 RNA is generally detectable in upper and lower  respiratory specimens during the acute phase of infection. The lowest  concentration of SARS-CoV-2 viral copies this assay can detect is 250  copies / mL. A negative result does not preclude SARS-CoV-2 infection  and should not be used as the sole basis for treatment or other  patient management decisions.  A negative result may occur with  improper specimen collection / handling, submission of specimen other  than nasopharyngeal swab, presence of viral mutation(s) within the  areas targeted by this assay, and inadequate number of viral copies  (<250 copies / mL). A negative result must be combined with clinical  observations, patient history, and epidemiological information. If result is POSITIVE SARS-CoV-2 target nucleic acids are DETECTED. The SARS-CoV-2 RNA is generally detectable in upper and lower  respiratory specimens dur ing the acute phase of infection.  Positive  results are indicative of active infection with SARS-CoV-2.  Clinical  correlation with patient history and other diagnostic information is  necessary to determine patient infection status.  Positive results do  not rule out bacterial infection or co-infection with other viruses. If result is PRESUMPTIVE POSTIVE SARS-CoV-2 nucleic acids MAY  BE PRESENT.   A presumptive positive result was obtained on the submitted specimen  and confirmed on repeat testing.  While 2019 novel coronavirus  (SARS-CoV-2) nucleic acids may be present in the submitted sample  additional confirmatory testing may be necessary for epidemiological  and / or clinical management purposes  to differentiate between  SARS-CoV-2 and other Sarbecovirus currently known to infect humans.  If clinically indicated additional testing with an alternate test  methodology [phone]) is advised. The SARS-CoV-2 RNA is generally  detectable in upper and lower respiratory sp ecimens during the acute  phase of infection. The expected result is Negative. Fact Sheet for Patients:  StrictlyIdeas.no Fact Sheet for Healthcare Providers: BankingDealers.co.za This test is not yet approved or cleared by the [place] FDA and has been authorized for detection and/or diagnosis of SARS-CoV-2 by FDA under an Emergency Use Authorization (EUA).  This EUA will remain in effect (meaning this test can be used)  for the duration of the COVID-19 declaration under Section 564(b)(1) of the Act, 21 U.S.C. section 360bbb-3(b)(1), unless the authorization is terminated or revoked sooner. Performed at [facility], [place] [address], [place], [place] [address]          Radiology Studies: Dg Chest Port 1 View  Result Date: 04/01/2019 CLINICAL DATA:  Weakness. EXAM: PORTABLE CHEST 1 VIEW COMPARISON:  Radiographs of June 27, 2009. FINDINGS: The heart size and mediastinal contours are within normal limits. Both lungs are clear. The visualized skeletal structures are unremarkable. IMPRESSION: No active disease. Electronically Signed   By: Marijo Conception M.D.   On: 04/01/2019 16:19   Vas [place] Lower Extremity Venous (dvt)  Result Date: 04/03/2019  Lower Venous Study Indications: Swelling, Pain, and Chronic pain in left foot.  Comparison  Study: No prior. Performing Technologist: Oda Cogan RDMS, RVT  Examination Guidelines: A complete evaluation includes B-mode imaging, spectral Doppler, color Doppler, and power Doppler as needed of all accessible portions of each vessel. Bilateral testing is considered an integral part of a complete examination. Limited examinations for reoccurring indications may be performed as noted.  +-----+---------------+---------+-----------+----------+--------------+  RIGHT Compressibility Phasicity Spontaneity Properties Thrombus Aging  +-----+---------------+---------+-----------+----------+--------------+  CFV   Full            Yes       Yes                                    +-----+---------------+---------+-----------+----------+--------------+   +---------+---------------+---------+-----------+----------+--------------+  LEFT      Compressibility Phasicity Spontaneity Properties Thrombus Aging  +---------+---------------+---------+-----------+----------+--------------+  CFV       Full            Yes       Yes                                    +---------+---------------+---------+-----------+----------+--------------+  SFJ       Full                                                             +---------+---------------+---------+-----------+----------+--------------+  FV Prox   Full                                                             +---------+---------------+---------+-----------+----------+--------------+  FV Mid    Full                                                             +---------+---------------+---------+-----------+----------+--------------+  FV Distal Full                                                             +---------+---------------+---------+-----------+----------+--------------+  PFV       Full                                                             +---------+---------------+---------+-----------+----------+--------------+  POP       Full            Yes       Yes                                     +---------+---------------+---------+-----------+----------+--------------+  PTV       Full                                                             +---------+---------------+---------+-----------+----------+--------------+  PERO      Full                                                             +---------+---------------+---------+-----------+----------+--------------+     Summary: Right: No evidence of common femoral vein obstruction. No cystic structure found in the popliteal fossa. Left: There is no evidence of deep vein thrombosis in the lower extremity. No cystic structure found in the popliteal fossa.  *See table(s) above for measurements and observations. Electronically signed by Harold Barban MD on 04/03/2019 at 7:00:22 AM.    Final    US Abdomen Limited Ruq  Result Date: 04/01/2019 CLINICAL DATA:  Increased LFTs, prior cholecystectomy EXAM: ULTRASOUND ABDOMEN LIMITED RIGHT UPPER QUADRANT COMPARISON:  February 03, 2009 FINDINGS: Gallbladder: The patient is status post cholecystectomy. No biliary ductal dilation. Common bile duct: Diameter: 5 mm Liver: No focal lesion identified. Increased echotexture seen throughout. No focal abnormality or biliary ductal dilatation. Portal vein is patent on color Doppler imaging with normal direction of blood flow towards the liver. Other: None. IMPRESSION: Hepatic steatosis. Electronically Signed   By: Prudencio Pair M.D.   On: 04/01/2019 23:38        Scheduled Meds:  apixaban  5 mg Oral BID   citalopram  40 mg Oral Daily   gabapentin  300 mg Oral TID   insulin aspart  0-9 Units Subcutaneous Q4H   levothyroxine  50 mcg Oral Q0600   methocarbamol  500 mg Oral TID   mirtazapine  15 mg Oral QHS   morphine  15 mg Oral TID   oxybutynin  10 mg Oral QHS   pantoprazole  40 mg Oral Daily   Continuous Infusions:  diltiazem (CARDIZEM) infusion 10 mg/hr (04/03/19 1020)     LOS: 0 days    Time spent: 40-45  min    Ezekiel Slocumb, MD [facility] Pager [phone]  If 7PM-7AM, please contact night-coverage [email or web address] Password [email or web address] 04/03/2019, 2:50 PM

## 2019-04-03 NOTE — Progress Notes (Signed)
OT Cancellation Note  Patient Details Name: [name] MRN: [id number] DOB: 17-Apr-1951   Cancelled Treatment:    Reason Eval/Treat Not Completed: Patient at procedure or test/ unavailable (TEE); will follow up for OT eval as schedule permits.  Lou Cal, OT Supplemental Rehabilitation Services Pager [phone] Office [phone]   Raymondo Band 04/03/2019, 8:42 AM

## 2019-04-03 NOTE — Progress Notes (Signed)
Physical Therapy Treatment Patient Details Name: [name] MRN: [id number] DOB: [birth year] Today's Date: 04/03/2019    History of Present Illness Patient is a 68 y/o female who presents with chest tightness, HA, palpitations. Found to be in A fib with RVR. PMH includes chronic pain, fibromyalgia, sleep-related hypoxia requiring 2 L at night, obesity, nonobstructive CAD, diet-controlled diabetes, depression, HTN.    PT Comments    Continuing work on functional mobility and activity tolerance;  HR much better controlled post cardioversion earlier today; Walked hallways slowly, pushing IV, but without loss of balance, and HR range 97-107, no symptoms/diaphoresis; [name] is encouraged  Follow Up Recommendations  No PT follow up;Supervision - Intermittent     Equipment Recommendations  None recommended by PT    Recommendations for Other Services       Precautions / Restrictions Precautions Precautions: Fall Precaution Comments: watch HR, dizziness -- much improved post cardioversion    Mobility  Bed Mobility   Bed Mobility: Supine to Sit;Sit to Supine     Supine to sit: Modified independent (Device/Increase time);HOB elevated Sit to supine: Modified independent (Device/Increase time);HOB elevated      Transfers Overall transfer level: Needs assistance Equipment used: None Transfers: Sit to/from Stand Sit to Stand: Supervision         General transfer comment: Cues to self-monitor for activity tolerance  Ambulation/Gait Ambulation/Gait assistance: Supervision Gait Distance (Feet): 150 Feet Assistive device: IV Pole Gait Pattern/deviations: Step-through pattern;Decreased stride length Gait velocity: decreased   General Gait Details: Slow, but steady gait; cues to self-monitor for activity tolerance; HR ranged 97-107 during walk    Stairs             Wheelchair Mobility    Modified Rankin (Stroke Patients Only)       Balance Overall balance  assessment: Mild deficits observed, not formally tested                                          Cognition Arousal/Alertness: Awake/alert Behavior During Therapy: WFL for tasks assessed/performed Overall Cognitive Status: Within Functional Limits for tasks assessed                                        Exercises      General Comments General comments (skin integrity, edema, etc.): No diaphoresis      Pertinent Vitals/Pain Pain Assessment: No/denies pain    Home Living                      Prior Function            PT Goals (current goals can now be found in the care plan section) Acute Rehab PT Goals Patient Stated Goal: to get better PT Goal Formulation: With patient Time For Goal Achievement: 04/16/19 Potential to Achieve Goals: Good Progress towards PT goals: Progressing toward goals    Frequency    Min 3X/week      PT Plan Current plan remains appropriate    Co-evaluation              AM-PAC PT "6 Clicks" Mobility   Outcome Measure  Help needed turning from your back to your side while in a flat bed without using bedrails?: None Help needed moving from lying  on your back to sitting on the side of a flat bed without using bedrails?: None Help needed moving to and from a bed to a chair (including a wheelchair)?: None Help needed standing up from a chair using your arms (e.g., wheelchair or bedside chair)?: None Help needed to walk in hospital room?: None Help needed climbing 3-5 steps with a railing? : A Little 6 Click Score: 23    End of Session Equipment Utilized During Treatment: Gait belt Activity Tolerance: Patient tolerated treatment well Patient left: in bed;with call bell/phone within reach;with family/visitor present Nurse Communication: Mobility status PT Visit Diagnosis: Difficulty in walking, not elsewhere classified (R26.2)     Time: 1213-1227 PT Time Calculation (min) (ACUTE ONLY): 14  min  Charges:  $Gait Training: 8-22 mins                     Roney Marion, PT  Acute Rehabilitation Services Pager [phone] Office [phone]    [name] 04/03/2019, 2:05 PM

## 2019-04-03 NOTE — Progress Notes (Addendum)
[place] for Heparin Indication: atrial fibrillation  Allergies  Allergen Reactions  . Rizatriptan Benzoate Anaphylaxis    Throat and Tongue Swelling Closed  . Sumatriptan Other (See Comments)    Throat closed & Tongue Swelling  . Tizanidine Shortness Of Breath  . Aleve [Naproxen Sodium] Hives  . Clarithromycin Hives  . Cyclobenzaprine Hcl     REACTION: THROAT CLOSED,TOUNGE SWELLING  . Doxycycline Diarrhea and Nausea And Vomiting    Reaction: unknown  . Duloxetine Nausea Only  . Keflex [Cephalexin] Itching    Sores  . Metronidazole Diarrhea and Nausea Only  . Oxycodone Hcl Diarrhea    Vomiting  . Oxycontin [Oxycodone Hcl] Nausea And Vomiting  . Penicillins     Reaction: unknown Has patient had a PCN reaction causing immediate rash, facial/tongue/throat swelling, SOB or lightheadedness with hypotension: {unknown Has patient had a PCN reaction causing severe rash involving mucus membranes or skin necrosis: {unknown Has patient had a PCN reaction that required hospitalization {unknown Has patient had a PCN reaction occurring within the last 10 years: {no If all of the above answers are "NO", then may proceed with Cephalosporin use.  . Rosuvastatin     REACTION: MUSCLE ACHES  . Septra [Sulfamethoxazole-Trimethoprim] Other (See Comments)    Rash and itching  . Venlafaxine Hives  . Voltaren [Diclofenac Sodium] Hives    oral  . Tramadol Diarrhea, Nausea And Vomiting and Palpitations    Patient Measurements: Height: 5\' 5"  (165.1 cm) Weight: 217 lb 9.6 oz (98.7 kg) IBW/kg (Calculated) : 57 Heparin Dosing Weight: 80.4  Vital Signs: Temp: 97.4 F (36.3 C) (10/07 0846) Temp Source: Temporal (10/07 1004) BP: 121/51 (10/07 1051) Pulse Rate: 79 (10/07 1030)  Labs: Recent Labs    04/01/19 1605 04/01/19 2010 04/01/19 2154  04/02/19 0405 04/02/19 0950 04/03/19 0258 04/03/19 1046  HGB 13.1  --   --   --  12.0  --  12.2  --   HCT  38.9  --   --   --  35.3*  --  35.0*  --   PLT 242  --   --   --  210  --  231  --   HEPARINUNFRC  --   --   --    < > 0.32 0.30 0.24* 0.39  CREATININE 0.76  --   --   --  0.66  --   --   --   TROPONINIHS 8 7 8   --   --   --   --   --    < > = values in this interval not displayed.    Estimated Creatinine Clearance: 79.4 mL/min (by C-G formula based on SCr of 0.66 mg/dL).   Assessment: 68 yo F with a chief complaints of feeling weak. She called her family physician who told her to go to urgent care.  Upon arrival there it was noted that her heart rate was in the 150s and she was sent here for evaluation. Pharmacy consulted to start heparin for atrial fibrillation.  Heparin level this morning is therapeutic, CBC stable. Pt converted to NSR s/p TEE-DCCV today, no thrombus noted.   Goal of Therapy:  Heparin level 0.3-0.7 units/ml Monitor platelets by anticoagulation protocol: Yes   Plan:  -Continue heparin 1350 units/hr -Daily heparin level and CBC   ADDENDUM: Pharmacy to transition to apixaban. Age < 31, SCr <1, Wt 99kg.  Plan: -Stop heparin -Start apixaban 5mg  BID   Arrie Senate,  PharmD, BCPS Clinical Pharmacist [phone] Please check AMION for all [place] numbers 04/03/2019

## 2019-04-03 NOTE — Anesthesia Procedure Notes (Signed)
Procedure Name: General with mask airway Date/Time: 04/03/2019 9:44 AM Performed by: Neldon Newport, CRNA Pre-anesthesia Checklist: Patient identified, Emergency Drugs available, Suction available, Patient being monitored and Timeout performed Patient Re-evaluated:Patient Re-evaluated prior to induction Oxygen Delivery Method: Ambu bag Preoxygenation: Pre-oxygenation with 100% oxygen Induction Type: IV induction

## 2019-04-03 NOTE — Anesthesia Preprocedure Evaluation (Addendum)
Anesthesia Evaluation  Patient identified by MRN, date of birth, ID band Patient awake    Reviewed: Allergy & Precautions, NPO status , Patient's Chart, lab work & pertinent test results  History of Anesthesia Complications (+) PONV and history of anesthetic complications  Airway Mallampati: III  TM Distance: >3 FB Neck ROM: Full  Mouth opening: Limited Mouth Opening  Dental no notable dental hx. (+) Teeth Intact, Dental Advisory Given   Pulmonary neg pulmonary ROS,  On 2L O2 qhs   Pulmonary exam normal breath sounds clear to auscultation       Cardiovascular hypertension, + CAD (nonobstructive)  Normal cardiovascular exam+ dysrhythmias Atrial Fibrillation  Rhythm:Regular Rate:Normal  TTE 2009 EF normal, valves ok   Neuro/Psych  Headaches, PSYCHIATRIC DISORDERS Depression    GI/Hepatic Neg liver ROS, GERD  ,  Endo/Other  diabetesHypothyroidism   Renal/GU negative Renal ROS  negative genitourinary   Musculoskeletal  (+) Arthritis , Osteoarthritis,  Fibromyalgia -, narcotic dependent  Abdominal   Peds  Hematology negative hematology ROS (+)   Anesthesia Other Findings   Reproductive/Obstetrics                            Anesthesia Physical Anesthesia Plan  ASA: III  Anesthesia Plan: General   Post-op Pain Management:    Induction: Intravenous  PONV Risk Score and Plan: 4 or greater and Propofol infusion and Treatment may vary due to age or medical condition  Airway Management Planned: Nasal Cannula  Additional Equipment:   Intra-op Plan:   Post-operative Plan:   Informed Consent: I have reviewed the patients History and Physical, chart, labs and discussed the procedure including the risks, benefits and alternatives for the proposed anesthesia with the patient or authorized representative who has indicated his/her understanding and acceptance.     Dental advisory  given  Plan Discussed with: CRNA  Anesthesia Plan Comments:         Anesthesia Quick Evaluation

## 2019-04-03 NOTE — Plan of Care (Signed)
  Problem: Activity: Goal: Ability to return to baseline activity level will improve Outcome: Progressing   Problem: Education: Goal: Understanding of CV disease, CV risk reduction, and recovery process will improve Outcome: Progressing Goal: Individualized Educational Video(s) Outcome: Progressing   Problem: Cardiovascular: Goal: Ability to achieve and maintain adequate cardiovascular perfusion will improve Outcome: Progressing Goal: Vascular access site(s) Level 0-1 will be maintained Outcome: Progressing   

## 2019-04-03 NOTE — Progress Notes (Signed)
Progress Note  Patient Name: [name] Date of Encounter: 04/03/2019  Primary Cardiologist: Glenetta Hew, MD   Subjective   No CP; still weak  Inpatient Medications    Scheduled Meds:  citalopram  40 mg Oral Daily   gabapentin  300 mg Oral TID   insulin aspart  0-9 Units Subcutaneous Q4H   levothyroxine  50 mcg Oral Q0600   methocarbamol  500 mg Oral TID   mirtazapine  15 mg Oral QHS   morphine  15 mg Oral TID   oxybutynin  10 mg Oral QHS   pantoprazole  40 mg Oral Daily   Continuous Infusions:  sodium chloride 20 mL/hr at 04/02/19 0921   diltiazem (CARDIZEM) infusion 15 mg/hr (04/03/19 0020)   heparin 1,350 Units/hr (04/03/19 0417)   PRN Meds: acetaminophen **OR** acetaminophen, HYDROcodone-acetaminophen, morphine injection, ondansetron **OR** ondansetron (ZOFRAN) IV   Vital Signs    Vitals:   04/02/19 1512 04/02/19 2100 04/03/19 0027 04/03/19 0426  BP: 112/66 (!) 118/53 (!) 119/56 (!) 98/54  Pulse: 91 (!) 110  84  Resp: 18 18 20 18   Temp: 98.3 F (36.8 C) 98.6 F (37 C) 98.4 F (36.9 C) 98.4 F (36.9 C)  TempSrc: Oral Oral Oral Oral  SpO2: 98% 96% 94% 97%  Weight:    98.7 kg  Height:        Intake/Output Summary (Last 24 hours) at 04/03/2019 0726 Last data filed at 04/03/2019 0417 Gross per 24 hour  Intake 924.63 ml  Output 2000 ml  Net -1075.37 ml   Last 3 Weights 04/03/2019 04/02/2019 04/01/2019  Weight (lbs) 217 lb 9.6 oz 219 lb 9.6 oz 224 lb  Weight (kg) 98.703 kg 99.61 kg 101.606 kg      Telemetry    Atrial fibrillation with elevated rate - Personally Reviewed   Physical Exam   GEN: NAD Neck: No JVD, supple Cardiac: tachycardic, irregular, no murmur Respiratory: Clear to auscultation bilaterally; no wheeze GI: Soft, NT/ND MS: No edema Neuro:  Grossly intact Psych: Normal affect   Labs    High Sensitivity Troponin:   Recent Labs  Lab 04/01/19 1605 04/01/19 2010 04/01/19 2154  TROPONINIHS 8 7 8        Chemistry Recent Labs  Lab 04/01/19 1605 04/02/19 0405  NA 138 143  K 3.8 4.2  CL 100 106  CO2 28 28  GLUCOSE 121* 134*  BUN 10 8  CREATININE 0.76 0.66  CALCIUM 8.9 8.8*  PROT 6.8 6.1*  ALBUMIN 3.7 3.3*  AST 50* 46*  ALT 47* 41  ALKPHOS 97 92  BILITOT 0.3 0.3  GFRNONAA >60 >60  GFRAA >60 >60  ANIONGAP 10 9     Hematology Recent Labs  Lab 04/01/19 1605 04/02/19 0405 04/03/19 0258  WBC 6.3 6.1 6.2  RBC 4.28 3.81* 3.88  HGB 13.1 12.0 12.2  HCT 38.9 35.3* 35.0*  MCV 90.9 92.7 90.2  MCH 30.6 31.5 31.4  MCHC 33.7 34.0 34.9  RDW 12.1 12.2 12.2  PLT 242 210 231     Radiology    Dg Chest Port 1 View  Result Date: 04/01/2019 CLINICAL DATA:  Weakness. EXAM: PORTABLE CHEST 1 VIEW COMPARISON:  Radiographs of June 27, 2009. FINDINGS: The heart size and mediastinal contours are within normal limits. Both lungs are clear. The visualized skeletal structures are unremarkable. IMPRESSION: No active disease. Electronically Signed   By: Marijo Conception M.D.   On: 04/01/2019 16:19   Vas [place] Lower Extremity Venous (  dvt)  Result Date: 04/03/2019  Lower Venous Study Indications: Swelling, Pain, and Chronic pain in left foot.  Comparison Study: No prior. Performing Technologist: Oda Cogan RDMS, RVT  Examination Guidelines: A complete evaluation includes B-mode imaging, spectral Doppler, color Doppler, and power Doppler as needed of all accessible portions of each vessel. Bilateral testing is considered an integral part of a complete examination. Limited examinations for reoccurring indications may be performed as noted.  +-----+---------------+---------+-----------+----------+--------------+  RIGHT Compressibility Phasicity Spontaneity Properties Thrombus Aging  +-----+---------------+---------+-----------+----------+--------------+  CFV   Full            Yes       Yes                                    +-----+---------------+---------+-----------+----------+--------------+    +---------+---------------+---------+-----------+----------+--------------+  LEFT      Compressibility Phasicity Spontaneity Properties Thrombus Aging  +---------+---------------+---------+-----------+----------+--------------+  CFV       Full            Yes       Yes                                    +---------+---------------+---------+-----------+----------+--------------+  SFJ       Full                                                             +---------+---------------+---------+-----------+----------+--------------+  FV Prox   Full                                                             +---------+---------------+---------+-----------+----------+--------------+  FV Mid    Full                                                             +---------+---------------+---------+-----------+----------+--------------+  FV Distal Full                                                             +---------+---------------+---------+-----------+----------+--------------+  PFV       Full                                                             +---------+---------------+---------+-----------+----------+--------------+  POP       Full            Yes       Yes                                    +---------+---------------+---------+-----------+----------+--------------+  PTV       Full                                                             +---------+---------------+---------+-----------+----------+--------------+  PERO      Full                                                             +---------+---------------+---------+-----------+----------+--------------+     Summary: Right: No evidence of common femoral vein obstruction. No cystic structure found in the popliteal fossa. Left: There is no evidence of deep vein thrombosis in the lower extremity. No cystic structure found in the popliteal fossa.  *See table(s) above for measurements and observations. Electronically signed by Harold Barban MD on 04/03/2019  at 7:00:22 AM.    Final    US Abdomen Limited Ruq  Result Date: 04/01/2019 CLINICAL DATA:  Increased LFTs, prior cholecystectomy EXAM: ULTRASOUND ABDOMEN LIMITED RIGHT UPPER QUADRANT COMPARISON:  February 03, 2009 FINDINGS: Gallbladder: The patient is status post cholecystectomy. No biliary ductal dilation. Common bile duct: Diameter: 5 mm Liver: No focal lesion identified. Increased echotexture seen throughout. No focal abnormality or biliary ductal dilatation. Portal vein is patent on color Doppler imaging with normal direction of blood flow towards the liver. Other: None. IMPRESSION: Hepatic steatosis. Electronically Signed   By: Prudencio Pair M.D.   On: 04/01/2019 23:38    Patient Profile     68 y.o. female with past medical history of fibromyalgia, chronic pain, nonobstructive coronary disease, diabetes mellitus admitted with new onset atrial fibrillation.  Assessment & Plan    1 new onset atrial fibrillation-Patient is symptomatic with atrial fibrillation.  We will continue Cardizem and IV heparin.  Plan for TEE guided cardioversion today.  We will need to transition to apixaban prior to discharge.  If she has more frequent episodes in the future we will need to consider antiarrhythmic.  2 hypertension-patient's blood pressure is controlled on cardizem; transition to po follow TEE/DCCV.  3 history of nonobstructive coronary disease-No ASA given need for apixaban. She will need to follow-up with Dr. Ellyn Hack concerning initiation of a statin.  She has an allergy to Crestor by report.  If she is allergic to statins may need to consider Zetia or Repatha pending LDL.  For questions or updates, please contact [place] Please consult [email or web address] for contact info under        Signed, Kirk Ruths, MD  04/03/2019, 7:26 AM

## 2019-04-03 NOTE — Progress Notes (Signed)
Transitions of Care Pharmacist Note  [name] is a 68 y.o. female that has been diagnosed with atrial fibrillation and will be prescribed Eliquis (apixaban) at discharge.   Patient Education: Hildred Laser, PharmD provided the following education on 04/03/19 to the patient: How to take the medication Described what the medication is Signs of bleeding Signs/symptoms of VTE and stroke  Answered their questions  Discharge Medications Plan: The patient wants to have their discharge medications filled by the Transitions of Care pharmacy rather than their usual pharmacy.  The primary doctor for this patient has been contacted to send all discharge medication prescriptions to the [facility] pharmacy, the discharge orders pharmacy has been changed to the [facility] pharmacy, the patient will receive a phone call regarding co-pay, and their medications will be delivered by the Trans[facility] pharmacy.   Insurance information: BIN: [id number] PCN: [id number] Grp: [id number] Member ID: [id number]   Thank you,   Agnes Lawrence, PharmD PGY1 Pharmacy Resident April 03, 2019

## 2019-04-03 NOTE — Progress Notes (Signed)
Echocardiogram Echocardiogram Transesophageal has been performed.  Oneal Deputy Inaki Vantine 04/03/2019, 10:10 AM

## 2019-04-04 ENCOUNTER — Encounter (HOSPITAL_COMMUNITY): Payer: Self-pay | Admitting: Cardiology

## 2019-04-04 DIAGNOSIS — E039 Hypothyroidism, unspecified: Secondary | ICD-10-CM

## 2019-04-04 LAB — BASIC METABOLIC PANEL
Anion gap: 15 (ref 5–15)
BUN: 14 mg/dL (ref 8–23)
CO2: 19 mmol/L — ABNORMAL LOW (ref 22–32)
Calcium: 9.2 mg/dL (ref 8.9–10.3)
Chloride: 106 mmol/L (ref 98–111)
Creatinine, Ser: 0.78 mg/dL (ref 0.44–1.00)
GFR calc Af Amer: >60 mL/min (ref >60)
GFR calc non Af Amer: >60 mL/min (ref >60)
Glucose, Bld: 132 mg/dL — ABNORMAL HIGH (ref 70–99)
Potassium: 4.9 mmol/L (ref 3.5–5.1)
Sodium: 140 mmol/L (ref 135–145)

## 2019-04-04 LAB — CBC
HCT: 31.2 % — ABNORMAL LOW (ref 36.0–46.0)
Hemoglobin: 10.4 g/dL — ABNORMAL LOW (ref 12.0–15.0)
MCH: 30.8 pg (ref 26.0–34.0)
MCHC: 33.3 g/dL (ref 30.0–36.0)
MCV: 92.3 fL (ref 80.0–100.0)
Platelets: 225 10*3/uL (ref 150–400)
RBC: 3.38 MIL/uL — ABNORMAL LOW (ref 3.87–5.11)
RDW: 12.6 % (ref 11.5–15.5)
WBC: 6.3 10*3/uL (ref 4.0–10.5)
nRBC: 0 % (ref 0.0–0.2)

## 2019-04-04 LAB — GLUCOSE, CAPILLARY
Glucose-Capillary: 106 mg/dL — ABNORMAL HIGH (ref 70–99)
Glucose-Capillary: 122 mg/dL — ABNORMAL HIGH (ref 70–99)
Glucose-Capillary: 112 mg/dL — ABNORMAL HIGH (ref 70–99)
Glucose-Capillary: 163 mg/dL — ABNORMAL HIGH (ref 70–99)

## 2019-04-04 MED ORDER — DILTIAZEM HCL ER COATED BEADS 240 MG PO CP24: 240.0000 mg | ORAL_CAPSULE | Freq: Every day | ORAL | 1 refills | Status: DC

## 2019-04-04 MED ORDER — DILTIAZEM HCL ER COATED BEADS 240 MG PO CP24
240.0000 mg | ORAL_CAPSULE | Freq: Every day | ORAL | Status: DC
  Administered 2019-04-04: 240 mg via ORAL
  Filled 2019-04-04: qty 1

## 2019-04-04 MED ORDER — CITALOPRAM HYDROBROMIDE 40 MG PO TABS: 40.0000 mg | ORAL_TABLET | Freq: Every day | ORAL | Status: DC

## 2019-04-04 MED ORDER — MIRTAZAPINE 15 MG PO TABS: 15.0000 mg | ORAL_TABLET | Freq: Every day | ORAL | Status: DC

## 2019-04-04 MED ORDER — APIXABAN 5 MG PO TABS: 5.0000 mg | ORAL_TABLET | Freq: Two times a day (BID) | ORAL | 1 refills | Status: DC

## 2019-04-04 MED FILL — ELIQUIS 5 MG TABLET: 5 | 30 days supply | Qty: 60 | Fill #0

## 2019-04-04 MED FILL — CARTIA XT 240 MG CAPSULE: 240 | 30 days supply | Qty: 30 | Fill #0

## 2019-04-04 NOTE — Progress Notes (Signed)
Progress Note  Patient Name: [name] Date of Encounter: 04/04/2019  Primary Cardiologist: Glenetta Hew, MD   Subjective   No CP; weakness and dyspnea improved.  Inpatient Medications    Scheduled Meds:  apixaban  5 mg Oral BID   citalopram  40 mg Oral Daily   gabapentin  300 mg Oral TID   insulin aspart  0-9 Units Subcutaneous Q4H   levothyroxine  50 mcg Oral Q0600   methocarbamol  500 mg Oral TID   mirtazapine  15 mg Oral QHS   morphine  15 mg Oral TID   oxybutynin  10 mg Oral QHS   pantoprazole  40 mg Oral Daily   Continuous Infusions:  diltiazem (CARDIZEM) infusion 10 mg/hr (04/04/19 0417)   PRN Meds: acetaminophen **OR** acetaminophen, HYDROcodone-acetaminophen, morphine injection, ondansetron **OR** ondansetron (ZOFRAN) IV   Vital Signs    Vitals:   04/03/19 1500 04/03/19 2031 04/04/19 0019 04/04/19 0414  BP:  (!) 118/54 (!) 99/52 (!) 116/55  Pulse:  79 75 76  Resp:  18 18 20   Temp:  98.9 F (37.2 C) 98.3 F (36.8 C) 98.4 F (36.9 C)  TempSrc:  Oral Oral Oral  SpO2: 94% 96% 97% 94%  Weight:    99.3 kg  Height:        Intake/Output Summary (Last 24 hours) at 04/04/2019 0748 Last data filed at 04/04/2019 0417 Gross per 24 hour  Intake 806.5 ml  Output 2600 ml  Net -1793.5 ml   Last 3 Weights 04/04/2019 04/03/2019 04/02/2019  Weight (lbs) 219 lb 217 lb 9.6 oz 219 lb 9.6 oz  Weight (kg) 99.338 kg 98.703 kg 99.61 kg      Telemetry    Sinus - Personally Reviewed   Physical Exam   GEN: NAD WD WN Neck: Supple Cardiac: RRR Respiratory: CTA GI: Soft, NT/ND, no masses MS: No edema Neuro:  No focal findings Psych: Normal affect   Labs    High Sensitivity Troponin:   Recent Labs  Lab 04/01/19 1605 04/01/19 2010 04/01/19 2154  TROPONINIHS 8 7 8       Chemistry Recent Labs  Lab 04/01/19 1605 04/02/19 0405 04/04/19 0224  NA 138 143 140  K 3.8 4.2 4.9  CL 100 106 106  CO2 28 28 19*  GLUCOSE 121* 134* 132*  BUN 10 8  14   CREATININE 0.76 0.66 0.78  CALCIUM 8.9 8.8* 9.2  PROT 6.8 6.1*  --   ALBUMIN 3.7 3.3*  --   AST 50* 46*  --   ALT 47* 41  --   ALKPHOS 97 92  --   BILITOT 0.3 0.3  --   GFRNONAA >60 >60 >60  GFRAA >60 >60 >60  ANIONGAP 10 9 15      Hematology Recent Labs  Lab 04/02/19 0405 04/03/19 0258 04/04/19 0308  WBC 6.1 6.2 6.3  RBC 3.81* 3.88 3.38*  HGB 12.0 12.2 10.4*  HCT 35.3* 35.0* 31.2*  MCV 92.7 90.2 92.3  MCH 31.5 31.4 30.8  MCHC 34.0 34.9 33.3  RDW 12.2 12.2 12.6  PLT 210 231 225     Radiology    Vas [place] Lower Extremity Venous (dvt)  Result Date: 04/03/2019  Lower Venous Study Indications: Swelling, Pain, and Chronic pain in left foot.  Comparison Study: No prior. Performing Technologist: Oda Cogan RDMS, RVT  Examination Guidelines: A complete evaluation includes B-mode imaging, spectral Doppler, color Doppler, and power Doppler as needed of all accessible portions of each vessel. Bilateral  testing is considered an integral part of a complete examination. Limited examinations for reoccurring indications may be performed as noted.  +-----+---------------+---------+-----------+----------+--------------+  RIGHT Compressibility Phasicity Spontaneity Properties Thrombus Aging  +-----+---------------+---------+-----------+----------+--------------+  CFV   Full            Yes       Yes                                    +-----+---------------+---------+-----------+----------+--------------+   +---------+---------------+---------+-----------+----------+--------------+  LEFT      Compressibility Phasicity Spontaneity Properties Thrombus Aging  +---------+---------------+---------+-----------+----------+--------------+  CFV       Full            Yes       Yes                                    +---------+---------------+---------+-----------+----------+--------------+  SFJ       Full                                                              +---------+---------------+---------+-----------+----------+--------------+  FV Prox   Full                                                             +---------+---------------+---------+-----------+----------+--------------+  FV Mid    Full                                                             +---------+---------------+---------+-----------+----------+--------------+  FV Distal Full                                                             +---------+---------------+---------+-----------+----------+--------------+  PFV       Full                                                             +---------+---------------+---------+-----------+----------+--------------+  POP       Full            Yes       Yes                                    +---------+---------------+---------+-----------+----------+--------------+  PTV       Full                                                             +---------+---------------+---------+-----------+----------+--------------+  PERO      Full                                                             +---------+---------------+---------+-----------+----------+--------------+     Summary: Right: No evidence of common femoral vein obstruction. No cystic structure found in the popliteal fossa. Left: There is no evidence of deep vein thrombosis in the lower extremity. No cystic structure found in the popliteal fossa.  *See table(s) above for measurements and observations. Electronically signed by Harold Barban MD on 04/03/2019 at 7:00:22 AM.    Final     Patient Profile     68 y.o. female with past medical history of fibromyalgia, chronic pain, nonobstructive coronary disease, diabetes mellitus admitted with new onset atrial fibrillation.  Assessment & Plan    1 new onset atrial fibrillation-Now s/p TEE/DCCV and remains in sinus; continue apixaban 5 mg BID; change cardizem to CD 240 mg daily.  If she has more frequent episodes in the future we will need to consider  antiarrhythmic. Note LV function normal on TEE.  2 hypertension-BP controlled; continue cardizem at DC.  3 history of nonobstructive coronary disease-No ASA given need for apixaban. She will need to follow-up with Dr. Ellyn Hack concerning initiation of a statin.  She has an allergy to Crestor by report.  If she is allergic to statins may need to consider Zetia or Repatha pending LDL.  Pt can be DCed from a cardiac standpoint.  [facility] will sign off.   Medication Recommendations:  Continue present cardiac meds as listed in MAR. Other recommendations (labs, testing, etc):  No additional testing Follow up as an outpatient:  APP one week and Dr Ellyn Hack 3 months.  For questions or updates, please contact [place] Please consult [email or web address] for contact info under        Signed, Kirk Ruths, MD  04/04/2019, 7:48 AM

## 2019-04-04 NOTE — TOC Benefit Eligibility Note (Signed)
Transition of Care [facility]) Benefit Eligibility Note    Patient Details  Name: [name] MRN: [id number] Date of Birth: 08-19-1950   Medication/Dose: 51m Eliquis  Covered?: Yes  Tier: 3 Drug  Prescription Coverage Preferred Pharmacy: Any retail pharmacy  Spoke with Person/Company/Phone Number:: EDanae Chen Co-Pay: 47.00 for a 30 day supply 131.00 for a 90 day suplly mail order     Deductible: [place] Number: 04/04/2019, 1:52 PM

## 2019-04-04 NOTE — Discharge Summary (Signed)
Physician Discharge Summary  MIKKEL COFER [id number] DOB: 1951-05-11 DOA: 04/01/2019  PCP: Owens Loffler, MD  Admit date: 04/01/2019 Discharge date: 04/04/2019  Admitted From: home Disposition:  home  Recommendations for Outpatient Follow-up:  1. Follow up with PCP in 1-2 weeks 2. Please obtain BMP/CBC in one week 3. 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   Presented with9 dayshistory of fatigue, lightheadafter experiencing diarrhea 1 weeks ago lasted 2 days since thenshe has been feeling some palpitations, headache,and chest tightness she presented to her urgent care on 10/6 and was found to be tachycardic into the 150s and was sent to emergency department where she was found to be in a flutter with initial rates of 150s started on IV diltiazem after administration of a bolus cardiology was consulted.  Patient has no prior history of this denies any history of bleeding not apparent on anticoagulation.  Reported severe central chest pain with associated belching and burping prior to presentation, and had some discomfort while in ER that was not HR dependent, not worse with inspiration, and was non-positional.  Described as dull aching.  No recent change in medications.  Denies any fevers or chills no cough no shortness of breath.  No sick contacts.  No COVID exposure has been isolating at home.  Cardiology was consulted and performed TEE/cardioversion 10/7 which was successful.  She was started on Eliquis and Cardizem CD.  She remained stable and asymptomatic for the duration of admission, and was discharged home to follow up outpatient with  cardiology and PCP.  Discharge Diagnoses:  Principal Problem:   Atrial fibrillation with RVR ([place]) Active Problems:   Essential hypertension   TRANSAMINASES, SERUM, ELEVATED   Hypothyroidism   HYPERLIPIDEMIA   GERD   Obesity (BMI 30-39.9)   Diabetes mellitus type 2, diet-controlled ([place])   Flutter-fibrillation [facility])    Discharge Instructions  Discharge Instructions    Call MD for:  persistant dizziness or light-headedness   Complete by: As directed    Diet - low sodium heart healthy   Complete by: As directed    Discharge instructions   Complete by: As directed    Take medications as prescribed. Follow up with Dr. Ellyn Hack for starting statin for cholesterol.   Increase activity slowly   Complete by: As directed      Allergies as of 04/04/2019      Reactions   Rizatriptan Benzoate Anaphylaxis   Throat and Tongue Swelling Closed   Sumatriptan Other (See Comments)   Throat closed & Tongue Swelling   Tizanidine Shortness Of Breath   Aleve [naproxen Sodium] Hives   Clarithromycin Hives   Cyclobenzaprine Hcl    REACTION: THROAT CLOSED,TOUNGE SWELLING   Doxycycline Diarrhea, Nausea And Vomiting   Reaction: unknown   Duloxetine Nausea Only   Keflex [cephalexin] Itching   Sores   Metronidazole Diarrhea, Nausea Only   Oxycodone Hcl Diarrhea   Vomiting   Oxycontin [oxycodone Hcl] Nausea And Vomiting   Penicillins    Reaction: unknown Has patient had a PCN reaction causing immediate rash, facial/tongue/throat swelling, SOB or lightheadedness with hypotension: {unknown Has patient had a PCN reaction causing severe rash involving mucus membranes or skin necrosis: {unknown Has patient had a PCN reaction that required hospitalization {unknown  Has patient had a PCN reaction occurring within the last 10 years: {no If all of the above answers are "NO", then may proceed with Cephalosporin use.   Rosuvastatin    REACTION: MUSCLE ACHES   Septra [sulfamethoxazole-trimethoprim]  Other (See Comments)   Rash and itching   Venlafaxine Hives   Voltaren [diclofenac Sodium] Hives   oral   Tramadol Diarrhea, Nausea And Vomiting, Palpitations      Medication List    STOP taking these medications   aspirin EC 81 MG tablet     TAKE these medications   acetaminophen 650 MG CR tablet Commonly known as: TYLENOL Take 650 mg by mouth 2 (two) times daily.   apixaban 5 MG Tabs tablet Commonly known as: ELIQUIS Take 1 tablet (5 mg total) by mouth 2 (two) times daily.   B COMPLEX 100 PO Take 100 mg by mouth daily.   bisacodyl 5 MG EC tablet Commonly known as: DULCOLAX Take 5-10 mg by mouth at bedtime.   CALCIUM 600 PO Take 600 mg by mouth 2 (two) times daily.   calcium carbonate 500 MG chewable tablet Commonly known as: TUMS - dosed in mg elemental calcium Chew 1-2 tablets by mouth 3 (three) times daily as needed for indigestion or heartburn.   carboxymethylcellulose 0.5 % Soln Commonly known as: REFRESH PLUS Place 1 drop into both eyes 3 (three) times daily as needed (dry eyes).   cetirizine 10 MG tablet Commonly known as: ZYRTEC Take 10 mg by mouth at bedtime.   citalopram 40 MG tablet Commonly known as: CELEXA Take 1 tablet (40 mg total) by mouth daily.   Coenzyme Q-10 100 MG capsule Take 100 mg by mouth daily.   diclofenac sodium 1 % Gel Commonly known as: VOLTAREN Apply 4 g topically 4 (four) times daily as needed (joint pain).   diltiazem 240 MG 24 hr capsule Commonly known as: CARDIZEM CD Take 1 capsule (240 mg total) by mouth daily. Start taking on: April 05, 2019   docusate sodium 100 MG capsule Commonly known as: COLACE Take 200 mg by mouth at bedtime.   gabapentin 300 MG capsule Commonly known as: NEURONTIN TAKE 1 CAPSULE BY MOUTH 3  TIMES DAILY   GRAPE SEED EXTRACT PO Take 1 tablet by mouth daily.   levothyroxine 50 MCG tablet Commonly known as: SYNTHROID TAKE 1 TABLET BY MOUTH  DAILY What changed: when to take this    loratadine 10 MG tablet Commonly known as: CLARITIN Take 10 mg by mouth daily.   MAGNESIUM GLYCINATE PLUS PO Take 800 mg by mouth at bedtime.   methocarbamol 500 MG tablet Commonly known as: ROBAXIN TAKE ONE (1) TABLET BY MOUTH EVERY EIGHTHOURS AS NEEDED FOR MUSCLE SPASMS What changed:   how much to take  how to take this  when to take this  additional instructions   mirtazapine 15 MG tablet Commonly known as: REMERON Take 1 tablet (15 mg total) by mouth at bedtime.   morphine 15 MG 12 hr tablet Commonly known as: MS Contin Take 1 tablet (15 mg total) by mouth 3 (three) times daily.   nitroGLYCERIN 0.4 MG SL tablet Commonly known as: NITROSTAT Place 1 tablet (0.4 mg total) under the tongue every 5 (five) minutes as needed for chest pain.   oxybutynin 10 MG 24 hr tablet Commonly known as: DITROPAN-XL Take 1 tablet (10 mg total) by mouth at bedtime.   RABEprazole 20 MG tablet Commonly known as: ACIPHEX Take 1 tablet (20 mg  total) by mouth daily.   vitamin C 500 MG tablet Commonly known as: ASCORBIC ACID Take 500 mg by mouth 2 (two) times daily.   Vitamin D 125 MCG (5000 UT) Caps Take 5,000 Units by mouth daily.       Allergies  Allergen Reactions  . Rizatriptan Benzoate Anaphylaxis    Throat and Tongue Swelling Closed  . Sumatriptan Other (See Comments)    Throat closed & Tongue Swelling  . Tizanidine Shortness Of Breath  . Aleve [Naproxen Sodium] Hives  . Clarithromycin Hives  . Cyclobenzaprine Hcl     REACTION: THROAT CLOSED,TOUNGE SWELLING  . Doxycycline Diarrhea and Nausea And Vomiting    Reaction: unknown  . Duloxetine Nausea Only  . Keflex [Cephalexin] Itching    Sores  . Metronidazole Diarrhea and Nausea Only  . Oxycodone Hcl Diarrhea    Vomiting  . Oxycontin [Oxycodone Hcl] Nausea And Vomiting  . Penicillins     Reaction: unknown Has patient had a PCN reaction causing immediate rash, facial/tongue/throat swelling, SOB or lightheadedness  with hypotension: {unknown Has patient had a PCN reaction causing severe rash involving mucus membranes or skin necrosis: {unknown Has patient had a PCN reaction that required hospitalization {unknown Has patient had a PCN reaction occurring within the last 10 years: {no If all of the above answers are "NO", then may proceed with Cephalosporin use.  . Rosuvastatin     REACTION: MUSCLE ACHES  . Septra [Sulfamethoxazole-Trimethoprim] Other (See Comments)    Rash and itching  . Venlafaxine Hives  . Voltaren [Diclofenac Sodium] Hives    oral  . Tramadol Diarrhea, Nausea And Vomiting and Palpitations    Consultations:  Cardiology, Dr. Stanford Breed   Procedures/Studies: Dg Chest Port 1 View  Result Date: 04/01/2019 CLINICAL DATA:  Weakness. EXAM: PORTABLE CHEST 1 VIEW COMPARISON:  Radiographs of June 27, 2009. FINDINGS: The heart size and mediastinal contours are within normal limits. Both lungs are clear. The visualized skeletal structures are unremarkable. IMPRESSION: No active disease. Electronically Signed   By: Marijo Conception M.D.   On: 04/01/2019 16:19   Vas [place] Lower Extremity Venous (dvt)  Result Date: 04/03/2019  Lower Venous Study Indications: Swelling, Pain, and Chronic pain in left foot.  Comparison Study: No prior. Performing Technologist: Oda Cogan RDMS, RVT  Examination Guidelines: A complete evaluation includes B-mode imaging, spectral Doppler, color Doppler, and power Doppler as needed of all accessible portions of each vessel. Bilateral testing is considered an integral part of a complete examination. Limited examinations for reoccurring indications may be performed as noted.  +-----+---------------+---------+-----------+----------+--------------+ RIGHTCompressibilityPhasicitySpontaneityPropertiesThrombus Aging +-----+---------------+---------+-----------+----------+--------------+ CFV  Full           Yes      Yes                                  +-----+---------------+---------+-----------+----------+--------------+   +---------+---------------+---------+-----------+----------+--------------+ LEFT     CompressibilityPhasicitySpontaneityPropertiesThrombus Aging +---------+---------------+---------+-----------+----------+--------------+ CFV      Full           Yes      Yes                                 +---------+---------------+---------+-----------+----------+--------------+ SFJ      Full                                                        +---------+---------------+---------+-----------+----------+--------------+  FV Prox  Full                                                        +---------+---------------+---------+-----------+----------+--------------+ FV Mid   Full                                                        +---------+---------------+---------+-----------+----------+--------------+ FV DistalFull                                                        +---------+---------------+---------+-----------+----------+--------------+ PFV      Full                                                        +---------+---------------+---------+-----------+----------+--------------+ POP      Full           Yes      Yes                                 +---------+---------------+---------+-----------+----------+--------------+ PTV      Full                                                        +---------+---------------+---------+-----------+----------+--------------+ PERO     Full                                                        +---------+---------------+---------+-----------+----------+--------------+     Summary: Right: No evidence of common femoral vein obstruction. No cystic structure found in the popliteal fossa. Left: There is no evidence of deep vein thrombosis in the lower extremity. No cystic structure found in the popliteal fossa.  *See table(s) above for  measurements and observations. Electronically signed by Harold Barban MD on 04/03/2019 at 7:00:22 AM.    Final    US Abdomen Limited Ruq  Result Date: 04/01/2019 CLINICAL DATA:  Increased LFTs, prior cholecystectomy EXAM: ULTRASOUND ABDOMEN LIMITED RIGHT UPPER QUADRANT COMPARISON:  February 03, 2009 FINDINGS: Gallbladder: The patient is status post cholecystectomy. No biliary ductal dilation. Common bile duct: Diameter: 5 mm Liver: No focal lesion identified. Increased echotexture seen throughout. No focal abnormality or biliary ductal dilatation. Portal vein is patent on color Doppler imaging with normal direction of blood flow towards the liver. Other: None. IMPRESSION: Hepatic steatosis. Electronically Signed   By: Prudencio Pair M.D.   On: 04/01/2019 23:38    (Echo, Carotid, EGD, Colonoscopy, ERCP)  Subjective:   Discharge Exam: Vitals:   04/04/19 0019 04/04/19 0414  BP: (!) 99/52 (!) 116/55  Pulse: 75 76  Resp: 18 20  Temp: 98.3 F (36.8 C) 98.4 F (36.9 C)  SpO2: 97% 94%   Vitals:   04/03/19 1500 04/03/19 2031 04/04/19 0019 04/04/19 0414  BP:  (!) 118/54 (!) 99/52 (!) 116/55  Pulse:  79 75 76  Resp:  18 18 20   Temp:  98.9 F (37.2 C) 98.3 F (36.8 C) 98.4 F (36.9 C)  TempSrc:  Oral Oral Oral  SpO2: 94% 96% 97% 94%  Weight:    99.3 kg  Height:        General: Pt is alert, awake, not in acute distress Cardiovascular: RRR, S1/S2 +, no rubs, no gallops Respiratory: CTA bilaterally, no wheezing, no rhonchi Abdominal: Soft, NT, ND, bowel sounds + Extremities: no edema, no cyanosis    The results of significant diagnostics from this hospitalization (including imaging, microbiology, ancillary and laboratory) are listed below for reference.     Microbiology: Recent Results (from the past 240 hour(s))  Novel Coronavirus, NAA ([facility])     Status: None   Collection Time: 04/01/19  2:26 PM   Specimen: Nasal Swab; Nasopharyngeal(NP) swabs in vial transport medium    NASOPHARYNGE  Result Value Ref Range Status   SARS-CoV-2, NAA Not Detected Not Detected Final    Comment: Testing was performed using the cobas(R) SARS-CoV-2 test. This nucleic acid amplification test was developed and its performance characteristics determined by [employer]. Nucleic acid amplification tests include PCR and TMA. This test has not been FDA cleared or approved. This test has been authorized by FDA under an Emergency Use Authorization (EUA). This test is only authorized for the duration of time the declaration that circumstances exist justifying the authorization of the emergency use of in vitro diagnostic tests for detection of SARS-CoV-2 virus and/or diagnosis of COVID-19 infection under section 564(b)(1) of the Act, 21 U.S.C. [id number]) (1), unless the authorization is terminated or revoked sooner. When diagnostic testing is negative, the possibility of a false negative result should be considered in the context of a patient's recent exposures and the presence of clinical signs and symptoms consistent with COVID-19. An individual without symptoms  of COVID-19 and who is not shedding SARS-CoV-2 virus would expect to have a negative (not detected) result in this assay.   SARS Coronavirus 2 [facility] order, Performed in [facility] hospital lab) Nasopharyngeal Nasopharyngeal Swab     Status: None   Collection Time: 04/01/19  7:00 PM   Specimen: Nasopharyngeal Swab  Result Value Ref Range Status   SARS Coronavirus 2 NEGATIVE NEGATIVE Final    Comment: (NOTE) If result is NEGATIVE SARS-CoV-2 target nucleic acids are NOT DETECTED. The SARS-CoV-2 RNA is generally detectable in upper and lower  respiratory specimens during the acute phase of infection. The lowest  concentration of SARS-CoV-2 viral copies this assay can detect is 250  copies / mL. A negative result does not preclude SARS-CoV-2 infection  and should not be used as the sole basis for treatment or  other  patient management decisions.  A negative result may occur with  improper specimen collection / handling, submission of specimen other  than nasopharyngeal swab, presence of viral mutation(s) within the  areas targeted by this assay, and inadequate number of viral copies  (<250 copies / mL). A negative result must be combined with clinical  observations, patient history, and epidemiological information. If result is POSITIVE  SARS-CoV-2 target nucleic acids are DETECTED. The SARS-CoV-2 RNA is generally detectable in upper and lower  respiratory specimens dur ing the acute phase of infection.  Positive  results are indicative of active infection with SARS-CoV-2.  Clinical  correlation with patient history and other diagnostic information is  necessary to determine patient infection status.  Positive results do  not rule out bacterial infection or co-infection with other viruses. If result is PRESUMPTIVE POSTIVE SARS-CoV-2 nucleic acids MAY BE PRESENT.   A presumptive positive result was obtained on the submitted specimen  and confirmed on repeat testing.  While 2019 novel coronavirus  (SARS-CoV-2) nucleic acids may be present in the submitted sample  additional confirmatory testing may be necessary for epidemiological  and / or clinical management purposes  to differentiate between  SARS-CoV-2 and other Sarbecovirus currently known to infect humans.  If clinically indicated additional testing with an alternate test  methodology [phone]) is advised. The SARS-CoV-2 RNA is generally  detectable in upper and lower respiratory sp ecimens during the acute  phase of infection. The expected result is Negative. Fact Sheet for Patients:  StrictlyIdeas.no Fact Sheet for Healthcare Providers: BankingDealers.co.za This test is not yet approved or cleared by the [place] FDA and has been authorized for detection and/or diagnosis of  SARS-CoV-2 by FDA under an Emergency Use Authorization (EUA).  This EUA will remain in effect (meaning this test can be used) for the duration of the COVID-19 declaration under Section 564(b)(1) of the Act, 21 U.S.C. section 360bbb-3(b)(1), unless the authorization is terminated or revoked sooner. Performed at [facility], [place] [address], [place], [place] [address]      Labs: BNP (last 3 results) No results for input(s): BNP in the last 8760 hours. Basic Metabolic Panel: Recent Labs  Lab 04/01/19 1605 04/02/19 0405 04/04/19 0224  NA 138 143 140  K 3.8 4.2 4.9  CL 100 106 106  CO2 28 28 19*  GLUCOSE 121* 134* 132*  BUN 10 8 14   CREATININE 0.76 0.66 0.78  CALCIUM 8.9 8.8* 9.2  MG 1.8 1.9  --   PHOS  --  3.2  --    Liver Function Tests: Recent Labs  Lab 04/01/19 1605 04/02/19 0405  AST 50* 46*  ALT 47* 41  ALKPHOS 97 92  BILITOT 0.3 0.3  PROT 6.8 6.1*  ALBUMIN 3.7 3.3*   No results for input(s): LIPASE, AMYLASE in the last 168 hours. No results for input(s): AMMONIA in the last 168 hours. CBC: Recent Labs  Lab 04/01/19 1605 04/02/19 0405 04/03/19 0258 04/04/19 0308  WBC 6.3 6.1 6.2 6.3  NEUTROABS 3.7  --   --   --   HGB 13.1 12.0 12.2 10.4*  HCT 38.9 35.3* 35.0* 31.2*  MCV 90.9 92.7 90.2 92.3  PLT 242 210 231 225   Cardiac Enzymes: No results for input(s): CKTOTAL, CKMB, CKMBINDEX, TROPONINI in the last 168 hours. BNP: Invalid input(s): POCBNP CBG: Recent Labs  Lab 04/03/19 1855 04/03/19 2033 04/04/19 0015 04/04/19 0418 04/04/19 0733  GLUCAP 157* 117* 122* 106* 112*   D-Dimer No results for input(s): DDIMER in the last 72 hours. Hgb A1c Recent Labs    04/02/19 0405  HGBA1C 7.0*   Lipid Profile No results for input(s): CHOL, HDL, LDLCALC, TRIG, CHOLHDL, LDLDIRECT in the last 72 hours. Thyroid function studies Recent Labs    04/02/19 0405  TSH 2.528   Anemia work up No results for input(s): VITAMINB12, FOLATE, FERRITIN, TIBC,  IRON, RETICCTPCT in the last 72 hours. Urinalysis    Component Value Date/Time   COLORURINE YELLOW 04/01/2019 [place] 04/01/2019 1647   LABSPEC 1.003 (L) 04/01/2019 1647   PHURINE 7.0 04/01/2019 1647   GLUCOSEU NEGATIVE 04/01/2019 1647   HGBUR NEGATIVE 04/01/2019 1647   BILIRUBINUR NEGATIVE 04/01/2019 1647   KETONESUR NEGATIVE 04/01/2019 1647   PROTEINUR NEGATIVE 04/01/2019 1647   NITRITE NEGATIVE 04/01/2019 1647   LEUKOCYTESUR NEGATIVE 04/01/2019 1647   Sepsis Labs Invalid input(s): PROCALCITONIN,  WBC,  LACTICIDVEN Microbiology Recent Results (from the past 240 hour(s))  Novel Coronavirus, NAA ([facility])     Status: None   Collection Time: 04/01/19  2:26 PM   Specimen: Nasal Swab; Nasopharyngeal(NP) swabs in vial transport medium   NASOPHARYNGE  Result Value Ref Range Status   SARS-CoV-2, NAA Not Detected Not Detected Final    Comment: Testing was performed using the cobas(R) SARS-CoV-2 test. This nucleic acid amplification test was developed and its performance characteristics determined by [employer]. Nucleic acid amplification tests include PCR and TMA. This test has not been FDA cleared or approved. This test has been authorized by FDA under an Emergency Use Authorization (EUA). This test is only authorized for the duration of time the declaration that circumstances exist justifying the authorization of the emergency use of in vitro diagnostic tests for detection of SARS-CoV-2 virus and/or diagnosis of COVID-19 infection under section 564(b)(1) of the Act, 21 U.S.C. [id number]) (1), unless the authorization is terminated or revoked sooner. When diagnostic testing is negative, the possibility of a false negative result should be considered in the context of a patient's recent exposures and the presence of clinical signs and symptoms consistent with COVID-19. An individual without symptoms  of COVID-19 and who is not shedding SARS-CoV-2 virus  would expect to have a negative (not detected) result in this assay.   SARS Coronavirus 2 [facility] order, Performed in [facility] hospital lab) Nasopharyngeal Nasopharyngeal Swab     Status: None   Collection Time: 04/01/19  7:00 PM   Specimen: Nasopharyngeal Swab  Result Value Ref Range Status   SARS Coronavirus 2 NEGATIVE NEGATIVE Final    Comment: (NOTE) If result is NEGATIVE SARS-CoV-2 target nucleic acids are NOT DETECTED. The SARS-CoV-2 RNA is generally detectable in upper and lower  respiratory specimens during the acute phase of infection. The lowest  concentration of SARS-CoV-2 viral copies this assay can detect is 250  copies / mL. A negative result does not preclude SARS-CoV-2 infection  and should not be used as the sole basis for treatment or other  patient management decisions.  A negative result may occur with  improper specimen collection / handling, submission of specimen other  than nasopharyngeal swab, presence of viral mutation(s) within the  areas targeted by this assay, and inadequate number of viral copies  (<250 copies / mL). A negative result must be combined with clinical  observations, patient history, and epidemiological information. If result is POSITIVE SARS-CoV-2 target nucleic acids are DETECTED. The SARS-CoV-2 RNA is generally detectable in upper and lower  respiratory specimens dur ing the acute phase of infection.  Positive  results are indicative of active infection with SARS-CoV-2.  Clinical  correlation with patient history and other diagnostic information is  necessary to determine patient infection status.  Positive results do  not rule out bacterial infection or co-infection with other viruses. If result is PRESUMPTIVE POSTIVE SARS-CoV-2 nucleic acids MAY BE PRESENT.   A presumptive positive result  was obtained on the submitted specimen  and confirmed on repeat testing.  While 2019 novel coronavirus  (SARS-CoV-2) nucleic acids may be  present in the submitted sample  additional confirmatory testing may be necessary for epidemiological  and / or clinical management purposes  to differentiate between  SARS-CoV-2 and other Sarbecovirus currently known to infect humans.  If clinically indicated additional testing with an alternate test  methodology [phone]) is advised. The SARS-CoV-2 RNA is generally  detectable in upper and lower respiratory sp ecimens during the acute  phase of infection. The expected result is Negative. Fact Sheet for Patients:  StrictlyIdeas.no Fact Sheet for Healthcare Providers: BankingDealers.co.za This test is not yet approved or cleared by the [place] FDA and has been authorized for detection and/or diagnosis of SARS-CoV-2 by FDA under an Emergency Use Authorization (EUA).  This EUA will remain in effect (meaning this test can be used) for the duration of the COVID-19 declaration under Section 564(b)(1) of the Act, 21 U.S.C. section 360bbb-3(b)(1), unless the authorization is terminated or revoked sooner. Performed at [facility], [place] [address], [place],  [address]      Time coordinating discharge: Over 30 minutes  SIGNED:   Ezekiel Slocumb, MD  [facility] 04/04/2019, 11:30 AM Pager   If 7PM-7AM, please contact night-coverage [email or web address] Password [email or web address]

## 2019-04-05 ENCOUNTER — Telehealth: Payer: Self-pay

## 2019-04-05 NOTE — Telephone Encounter (Signed)
Transition Care Management Follow-up Telephone Call   Date discharged? 04/04/2019   How have you been since you were released from the hospital? Patient is feeling better. No symptom of concern at this time.   Do you understand why you were in the hospital? yes   Do you understand the discharge instructions? yes   Where were you discharged to? Home with husband.   Items Reviewed:  Medications reviewed: yes  Allergies reviewed: yes  Dietary changes reviewed: yes  Referrals reviewed: yes   Functional Questionnaire:   Activities of Daily Living (ADLs):   She states they are independent in the following: ambulation, dressing, bathing, toileting, hygiene, fixing food and medication. States they require assistance with the following: husband helps cook sometimes   Any transportation issues/concerns?: No   Any patient concerns? Not at this time  Confirmed importance and date/time of follow-up visits scheduled Yes Appointment with Dr Lorelei Pont on 04/15/2019.  Confirmed with patient if condition begins to worsen call PCP or go to the ER.  Patient was given the office number and encouraged to call back with question or concerns.  : yes

## 2019-04-10 ENCOUNTER — Encounter: Payer: Self-pay | Admitting: Adult Health

## 2019-04-10 ENCOUNTER — Other Ambulatory Visit: Payer: Self-pay

## 2019-04-10 ENCOUNTER — Ambulatory Visit: Payer: Medicare Other | Admitting: General Practice

## 2019-04-10 VITALS — BP 129/66 | HR 66 | Temp 97.1°F | Ht 65.0 in | Wt 220.0 lb

## 2019-04-10 DIAGNOSIS — I10 Essential (primary) hypertension: Secondary | ICD-10-CM

## 2019-04-10 DIAGNOSIS — I4891 Unspecified atrial fibrillation: Secondary | ICD-10-CM

## 2019-04-10 DIAGNOSIS — Z79899 Other long term (current) drug therapy: Secondary | ICD-10-CM

## 2019-04-10 NOTE — Patient Instructions (Signed)
Medication Instructions:  Continue current medications  If you need a refill on your cardiac medications before your next appointment, please call your pharmacy.  Labwork: CBC Today HERE IN OUR OFFICE AT [facility]  You will NOT need to fast   Take the provided lab slips with you to the lab for your blood draw.   When you have your labs (blood work) drawn today and your tests are completely normal, you will receive your results only by MyChart Message (if you have MyChart) -OR-  A paper copy in the mail.  If you have any lab test that is abnormal or we need to change your treatment, we will call you to review these results.  Testing/Procedures: None Ordered  Follow-Up: . Your physician recommends that you schedule a follow-up appointment in: 4 Weeks with Dr Ellyn Hack or [place], you and your health needs are our priority.  As part of our continuing mission to provide you with exceptional heart care, we have created designated Provider Care Teams.  These Care Teams include your primary Cardiologist (physician) and Advanced Practice Providers (APPs -  Physician Assistants and Nurse Practitioners) who all work together to provide you with the care you need, when you need it.  Thank you for choosing [facility] at [facility]!!

## 2019-04-10 NOTE — Progress Notes (Signed)
Cardiology Clinic Note   Patient Name: [name] Date of Encounter: 04/10/2019  Primary Care Provider:  Owens Loffler, MD Primary Cardiologist:  Glenetta Hew, MD  Patient Profile    Antibiotics 68 year old female presents for a follow-up for her atrial fibrillation with RVR status post DCCV.  Past Medical History    Past Medical History:  Diagnosis Date   Allergy    Anemia    Arthritis    B12 deficiency    Complication of anesthesia    slow to wake up    Depression    Diabetes mellitus type 2, diet-controlled ([place]) 07/24/2018   Diabetes mellitus without complication ([place])    type II - diet controlled ; A1C 6.5 01/2017   Fibromyalgia    Generalized hyperhidrosis 04/17/2014   GERD (gastroesophageal reflux disease)    Heart murmur    Aortic Sclerosis   Hyperlipemia    HYPOTHYROIDISM 05/29/2007   Irritable bowel syndrome    Migraines    hx of    Oxygen deficiency    2 L at bedtime   Oxygen dependent    uses 2l at hs due to MS Contin per patient   PONV (postoperative nausea and vomiting)    PVC (premature ventricular contraction)    Type II or unspecified type diabetes mellitus with neurological manifestations, not stated as uncontrolled(250.60) 04/07/2013   Unspecified essential hypertension    denies htn on 05/09/16   Past Surgical History:  Procedure Laterality Date   24 HOUR [place] STUDY N/A 01/04/2016   Procedure: 24 HOUR [place] STUDY;  Surgeon: Manus Gunning, MD;  Location: [facility];  Service: Gastroenterology;  Laterality: N/A;   ABDOMINAL HYSTERECTOMY  1988   ADENOIDECTOMY     ANTERIOR CERVICAL DECOMP/DISCECTOMY FUSION  2009, 1993   first procedure in ~ 1993/Dr Arnold N/A 04/03/2019   Procedure: CARDIOVERSION;  Surgeon: Dorothy Spark, MD;  Location: [facility];  Service: Cardiovascular;  Laterality: N/A;   CHOLECYSTECTOMY  2005   ESOPHAGEAL MANOMETRY  N/A 01/04/2016   Procedure: ESOPHAGEAL MANOMETRY (EM) 24 HR PH;  Surgeon: Manus Gunning, MD;  Location: [facility];  Service: Gastroenterology;  Laterality: N/A;   ESOPHAGOGASTRODUODENOSCOPY (EGD) WITH PROPOFOL N/A 05/17/2016   Procedure: ESOPHAGOGASTRODUODENOSCOPY (EGD) WITH PROPOFOL;  Surgeon: Manus Gunning, MD;  Location: [facility];  Service: Gastroenterology;  Laterality: N/A;   NASAL SINUS SURGERY  2006   SAVORY DILATION N/A 05/17/2016   Procedure: SAVORY DILATION;  Surgeon: Manus Gunning, MD;  Location: [facility];  Service: Gastroenterology;  Laterality: N/A;   TEE WITHOUT CARDIOVERSION N/A 04/03/2019   Procedure: TRANSESOPHAGEAL ECHOCARDIOGRAM (TEE);  Surgeon: Dorothy Spark, MD;  Location: [place];  Service: Cardiovascular;  Laterality: N/A;   TONSILLECTOMY AND ADENOIDECTOMY     TRANSTHORACIC ECHOCARDIOGRAM  2009   Normal LV size and function. EF 60-65%. No regional wall motion and amount is. Mild aortic sclerosis, no stenosis   TUBAL LIGATION  1979    Allergies  Allergies  Allergen Reactions   Rizatriptan Benzoate Anaphylaxis    Throat and Tongue Swelling Closed   Sumatriptan Other (See Comments)    Throat closed & Tongue Swelling   Tizanidine Shortness Of Breath   Aleve [Naproxen Sodium] Hives   Clarithromycin Hives   Cyclobenzaprine Hcl     REACTION: THROAT CLOSED,TOUNGE SWELLING   Doxycycline Diarrhea and Nausea And Vomiting    Reaction: unknown  Duloxetine Nausea Only   Keflex [Cephalexin] Itching    Sores   Metronidazole Diarrhea and Nausea Only   Oxycodone Hcl Diarrhea    Vomiting   Oxycontin [Oxycodone Hcl] Nausea And Vomiting   Penicillins     Reaction: unknown Has patient had a PCN reaction causing immediate rash, facial/tongue/throat swelling, SOB or lightheadedness with hypotension: {unknown Has patient had a PCN reaction causing severe rash involving mucus membranes or skin necrosis:  {unknown Has patient had a PCN reaction that required hospitalization {unknown Has patient had a PCN reaction occurring within the last 10 years: {no If all of the above answers are "NO", then may proceed with Cephalosporin use.   Rosuvastatin     REACTION: MUSCLE ACHES   Septra [Sulfamethoxazole-Trimethoprim] Other (See Comments)    Rash and itching   Venlafaxine Hives   Voltaren [Diclofenac Sodium] Hives    oral   Tramadol Diarrhea, Nausea And Vomiting and Palpitations    History of Present Illness    Ms. 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  She presented to the emergency department on 04/01/2019 where she was started on IV diltiazem for her atrial flutter with an initial rate of 150 bpm.  On 04/03/2019 she underwent TEE (LVEF 55 to 60%, mild MR, mild AI, mild TR, no thrombus, and successful cardioversion with 1 120 J.  Her EKG post cardioversion showed normal sinus rhythm 86 bpm.  She maintained sinus rhythm and was discharged on 04/04/2019 with apixaban 5 mg twice daily and Cardizem 240 mg daily.  She presents the clinic today and states she feels well today.  She states she has noticed occasional flutters that feel like her routine PVCs.  She states she can feel when her heart was in atrial fibrillation/flutter but has not noticed sustained irregular beats since her cardioversion.  She has been compliant with her medications and has begun to resume her previous daily activities.  She states she is limited in amount of walking she can do due to a failed left foot surgery.  She denies chest pain, shortness of breath, lower extremity edema, fatigue,  palpitations, melena, hematuria, hemoptysis, diaphoresis, weakness, presyncope, syncope, orthopnea, and PND.   Home Medications    Prior to Admission medications   Medication Sig Start Date End Date Taking? Authorizing Provider  acetaminophen (TYLENOL) 650 MG CR tablet Take 650 mg by mouth 2 (two) times daily.    [provider]  apixaban (ELIQUIS) 5 MG TABS tablet Take 1 tablet (5 mg total) by mouth 2 (two) times daily. 04/04/19   Nicole Kindred A, DO  B Complex Vitamins (B COMPLEX 100 PO) Take 100 mg by mouth daily.     [provider]  bisacodyl (DULCOLAX) 5 MG EC tablet Take 5-10 mg by mouth at bedtime.     [provider]  Calcium Carbonate (CALCIUM 600 PO) Take 600 mg by mouth 2 (two) times daily.     [provider]  calcium carbonate (TUMS - DOSED IN MG ELEMENTAL CALCIUM) 500 MG chewable tablet Chew 1-2 tablets by mouth 3 (three) times daily as needed for indigestion or heartburn.    [provider]  carboxymethylcellulose (REFRESH PLUS) 0.5 % SOLN Place 1 drop into both eyes 3 (three) times daily as needed (dry eyes).  [provider]  cetirizine (ZYRTEC) 10 MG tablet Take 10 mg by mouth at bedtime.     [provider]  Cholecalciferol (VITAMIN D) 125 MCG (5000 UT) CAPS Take 5,000 Units by mouth daily.     [provider]  citalopram (CELEXA) 40 MG tablet Take 1 tablet (40 mg total) by mouth daily. 04/04/19   Nicole Kindred A, DO  Coenzyme Q-10 100 MG capsule Take 100 mg by mouth daily.    [provider]  diclofenac sodium (VOLTAREN) 1 % GEL Apply 4 g topically 4 (four) times daily as needed (joint pain).     [provider]  diltiazem (CARDIZEM CD) 240 MG 24 hr capsule Take 1 capsule (240 mg total) by mouth daily. 04/05/19   Ezekiel Slocumb, DO  docusate sodium (COLACE) 100 MG capsule Take 200 mg by mouth at bedtime.     [provider]  gabapentin (NEURONTIN) 300 MG capsule TAKE 1  CAPSULE BY MOUTH 3  TIMES DAILY Patient taking differently: Take 300 mg by mouth 3 (three) times daily.  02/12/19   Bayard Hugger, NP  GRAPE SEED EXTRACT PO Take 1 tablet by mouth daily.     [provider]  levothyroxine (SYNTHROID) 50 MCG tablet TAKE 1 TABLET BY MOUTH  DAILY Patient taking differently: Take 50 mcg by mouth daily before breakfast.  02/12/19   Copland, Frederico Hamman, MD  loratadine (CLARITIN) 10 MG tablet Take 10 mg by mouth daily.    [provider]  MAGNESIUM GLYCINATE PLUS PO Take 800 mg by mouth at bedtime.     [provider]  methocarbamol (ROBAXIN) 500 MG tablet TAKE ONE (1) TABLET BY MOUTH EVERY EIGHTHOURS AS NEEDED FOR MUSCLE SPASMS Patient taking differently: Take 500 mg by mouth 3 (three) times daily.  06/13/18   Bayard Hugger, NP  mirtazapine (REMERON) 15 MG tablet Take 1 tablet (15 mg total) by mouth at bedtime. 04/04/19   Nicole Kindred A, DO  morphine (MS CONTIN) 15 MG 12 hr tablet Take 1 tablet (15 mg total) by mouth 3 (three) times daily. 03/19/19   Bayard Hugger, NP  nitroGLYCERIN (NITROSTAT) 0.4 MG SL tablet Place 1 tablet (0.4 mg total) under the tongue every 5 (five) minutes as needed for chest pain. 06/13/18   [name], MD  oxybutynin (DITROPAN-XL) 10 MG 24 hr tablet Take 1 tablet (10 mg total) by mouth at bedtime. 08/24/18   Copland, Frederico Hamman, MD  RABEprazole (ACIPHEX) 20 MG tablet Take 1 tablet (20 mg total) by mouth daily. 02/13/19   Copland, Frederico Hamman, MD  vitamin C (ASCORBIC ACID) 500 MG tablet Take 500 mg by mouth 2 (two) times daily.    [provider]    Family History    Family History  Problem Relation Age of Onset   Breast cancer Mother    Diabetes Mother    Heart disease Mother    Allergies Mother    Diabetes Father    Heart disease Father    Emphysema Father    Allergies Father    Stomach cancer Maternal Uncle    Breast cancer Paternal Aunt    Colon cancer Neg Hx    She indicated  that her mother is deceased. She indicated that her father is deceased. She indicated that the status of her maternal uncle is unknown. She indicated that the status of her paternal aunt is unknown. She indicated that the status of her neg hx is unknown.  Social History    Social History   Socioeconomic History   Marital status: Married    Spouse name: Not on file   Number of children: Not on file   Years of education: Not on file   Highest education level: Not on file  Occupational History   Occupation: Radiation protection practitioner   Occupation: retired  Scientist, product/process development strain: Not hard at [employer]    Worry: Never true    Inability: Never true   Transportation needs    Medical: No    Non-medical: No  Tobacco Use   Smoking status: Never Smoker   Smokeless tobacco: Never Used  Substance and Sexual Activity   Alcohol use: Not Currently    Alcohol/week: 0.0 standard drinks    Comment: rare   Drug use: Yes    Types: Morphine   Sexual activity: Not Currently  Lifestyle   Physical activity    Days per week: 0 days    Minutes per session: 0 min   Stress: Not at all  Relationships   Social connections    Talks on phone: Not on file    Gets together: Not on file    Attends religious service: Not on file    Active member of club or organization: Not on file    Attends meetings of clubs or organizations: Not on file    Relationship status: Not on file   Intimate partner violence    Fear of current or ex partner: No    Emotionally abused: No    Physically abused: No    Forced sexual activity: No  Other Topics Concern   Not on file  Social History Narrative   Not on file     Review of Systems    General:  No chills, fever, night sweats or weight changes.  Cardiovascular:  No chest pain, dyspnea on exertion, edema, orthopnea, palpitations, paroxysmal nocturnal dyspnea. Dermatological: No rash, lesions/masses Respiratory: No cough,  dyspnea Urologic: No hematuria, dysuria Abdominal:   No nausea, vomiting, diarrhea, bright red blood per rectum, melena, or hematemesis Neurologic:  No visual changes, wkns, changes in mental status. All other systems reviewed and are otherwise negative except as noted above.  Physical Exam    VS:  BP 129/66    Pulse 66    Temp (!) 97.1 F (36.2 C)    Ht 5\' 5"  (1.651 m)    Wt 220 lb (99.8 kg)    SpO2 97%    BMI 36.61 kg/m  , BMI Body mass index is 36.61 kg/m. GEN: Well nourished, well developed, in no acute distress. HEENT: normal. Neck: Supple, no JVD, carotid bruits, or masses. Cardiac: RRR, no murmurs, rubs, or gallops. No clubbing, cyanosis, edema.  Radials/DP/PT 2+ and equal bilaterally.  Respiratory:  Respirations regular and unlabored, clear to auscultation bilaterally. GI: Soft, nontender, nondistended, BS + x 4. MS: no deformity or atrophy. Skin: warm and dry, no rash. Neuro:  Strength and sensation are intact. Psych: Normal affect.  Accessory Clinical Findings    ECG personally reviewed by me today-normal sinus rhythm 65 bpm- No acute changes  EKG 04/03/2019 Normal sinus rhythm 86 bpm  Echo TEE 04/03/2019 IMPRESSIONS    1. Left ventricular ejection fraction, by visual estimation, is 55 to 60%. The left ventricle has normal function. Normal left ventricular size. There is no left ventricular hypertrophy.  2. Global right ventricle has normal systolic function.The right ventricular size is normal. No increase  in right ventricular wall thickness.  3. Left atrial size was moderately dilated.  4. No thrombus is seen in the left atrium or left atrial appendage.  5. Right atrial size was mildly dilated.  6. The mitral valve is normal in structure. Mild mitral valve regurgitation. No evidence of mitral stenosis.  7. The tricuspid valve is normal in structure. Tricuspid valve regurgitation mild-moderate.  8. The aortic valve is normal in structure. Aortic valve regurgitation  is mild by color flow Doppler. Mild aortic valve sclerosis without stenosis.  9. The pulmonic valve was normal in structure. Pulmonic valve regurgitation is not visualized by color flow Doppler. 10. Moderately elevated pulmonary artery systolic pressure. 11. The tricuspid regurgitant velocity is 3.12 m/s, and with an assumed right atrial pressure of 8 mmHg, the estimated right ventricular systolic pressure is moderately elevated at 46.9 mmHg. 12. The inferior vena cava is normal in size with greater than 50% respiratory variability, suggesting right atrial pressure of 3 mmHg. 13. No thrombus is seen in the left atrium or left atrial appendage. 14. This TEE was followed by a successful cardioversion.   Assessment & Plan     New onset atrial fibrillation-underwent successful DCCV on 04/03/2019.  EKG today shows normal sinus rhythm 65 bpm.  No chest pain, dyspnea, or palpitations. Continue diltiazem 240 mg tablet daily Continue apixaban 5 mg tablet 3 times daily Limit caffeine consumption and caffeine  Continue to use supplemental oxygen at night Keep follow-up appointment with pulmonologist-Dr. Halford Chessman Obtain CBC today-hemoglobin trending down in the hospital and 10.8 with last draw on 04/04/2019 Educated about triggers for atrial fibrillation  Essential hypertension-BP today 129/66.  Well-controlled at home. Continue diltiazem 240 mg daily Continue heart healthy low-sodium diet Increase physical activity as tolerated  Hx nonobstructive coronary artery disease-no aspirin in this case due to apixaban. Statin intolerance-reported Crestor allergy Recommend follow-up with lipid clinic Heart healthy low-sodium high-fiber diet Increase physical activity as tolerated  Disposition: Follow-up with Dr. Ellyn Hack in 4 weeks  Deberah Pelton, NP-C 04/10/2019, 4:03 PM

## 2019-04-11 LAB — CBC
Hematocrit: 35.7 % (ref 34.0–46.6)
Hemoglobin: 12.4 g/dL (ref 11.1–15.9)
MCH: 30.8 pg (ref 26.6–33.0)
MCHC: 34.7 g/dL (ref 31.5–35.7)
MCV: 89 fL (ref 79–97)
Platelets: 281 10*3/uL (ref 150–450)
RBC: 4.03 x10E6/uL (ref 3.77–5.28)
RDW: 12.2 % (ref 11.7–15.4)
WBC: 7 10*3/uL (ref 3.4–10.8)

## 2019-04-13 ENCOUNTER — Other Ambulatory Visit: Payer: Self-pay

## 2019-04-13 ENCOUNTER — Emergency Department (HOSPITAL_COMMUNITY): Payer: Medicare Other

## 2019-04-13 ENCOUNTER — Encounter (HOSPITAL_COMMUNITY): Payer: Self-pay | Admitting: Emergency Medicine

## 2019-04-13 ENCOUNTER — Emergency Department (HOSPITAL_COMMUNITY)
Admission: EM | Admit: 2019-04-13 | Discharge: 2019-04-13 | Disposition: A | Discharge status: Home / Self Care | Payer: Medicare Other | Attending: Emergency Medicine | Admitting: Emergency Medicine

## 2019-04-13 ENCOUNTER — Telehealth: Payer: Self-pay | Admitting: Cardiology

## 2019-04-13 DIAGNOSIS — I480 Paroxysmal atrial fibrillation: Secondary | ICD-10-CM | POA: Insufficient documentation

## 2019-04-13 DIAGNOSIS — E119 Type 2 diabetes mellitus without complications: Secondary | ICD-10-CM | POA: Insufficient documentation

## 2019-04-13 DIAGNOSIS — R42 Dizziness and giddiness: Secondary | ICD-10-CM | POA: Diagnosis present

## 2019-04-13 DIAGNOSIS — I10 Essential (primary) hypertension: Secondary | ICD-10-CM | POA: Insufficient documentation

## 2019-04-13 DIAGNOSIS — E039 Hypothyroidism, unspecified: Secondary | ICD-10-CM | POA: Insufficient documentation

## 2019-04-13 DIAGNOSIS — Z79899 Other long term (current) drug therapy: Secondary | ICD-10-CM | POA: Insufficient documentation

## 2019-04-13 DIAGNOSIS — I4891 Unspecified atrial fibrillation: Secondary | ICD-10-CM | POA: Diagnosis not present

## 2019-04-13 LAB — BASIC METABOLIC PANEL
Anion gap: 11 (ref 5–15)
BUN: 14 mg/dL (ref 8–23)
CO2: 25 mmol/L (ref 22–32)
Calcium: 9.1 mg/dL (ref 8.9–10.3)
Chloride: 100 mmol/L (ref 98–111)
Creatinine, Ser: 0.89 mg/dL (ref 0.44–1.00)
GFR calc Af Amer: >60 mL/min (ref >60)
GFR calc non Af Amer: >60 mL/min (ref >60)
Glucose, Bld: 285 mg/dL — ABNORMAL HIGH (ref 70–99)
Potassium: 4 mmol/L (ref 3.5–5.1)
Sodium: 136 mmol/L (ref 135–145)

## 2019-04-13 LAB — CBC
HCT: 39.5 % (ref 36.0–46.0)
Hemoglobin: 12.9 g/dL (ref 12.0–15.0)
MCH: 30.3 pg (ref 26.0–34.0)
MCHC: 32.7 g/dL (ref 30.0–36.0)
MCV: 92.7 fL (ref 80.0–100.0)
Platelets: 276 10*3/uL (ref 150–400)
RBC: 4.26 MIL/uL (ref 3.87–5.11)
RDW: 12.1 % (ref 11.5–15.5)
WBC: 6.6 10*3/uL (ref 4.0–10.5)
nRBC: 0 % (ref 0.0–0.2)

## 2019-04-13 LAB — TROPONIN I (HIGH SENSITIVITY)
Troponin I (High Sensitivity): 5 ng/L (ref <18)
Troponin I (High Sensitivity): 3 ng/L (ref <18)

## 2019-04-13 LAB — TSH: TSH: 2.333 u[IU]/mL (ref 0.350–4.500)

## 2019-04-13 LAB — MAGNESIUM: Magnesium: 1.8 mg/dL (ref 1.7–2.4)

## 2019-04-13 MED ORDER — SODIUM CHLORIDE 0.9 % IV BOLUS
1000.0000 mL | Freq: Once | INTRAVENOUS | Status: AC
  Administered 2019-04-13: 1000 mL via INTRAVENOUS

## 2019-04-13 MED ORDER — SODIUM CHLORIDE 0.9% FLUSH: 3.0000 mL | Freq: Once | INTRAVENOUS | Status: DC

## 2019-04-13 NOTE — ED Notes (Signed)
Pt given dc instructions pt verbalizes understanding.  

## 2019-04-13 NOTE — ED Provider Notes (Signed)
[facility] EMERGENCY DEPARTMENT Provider Note   CSN: [id number] Arrival date & time: 04/13/19  1114     History   Chief Complaint Chief Complaint  Patient presents with  . Atrial Fibrillation    HPI [name] is a 68 y.o. female.     Pt presents to the ED today with dizziness and not feeling well.  She has a hx of afib with RVR that was diagnosed on 10/5.  She had a TEE and was cardioverted on 10/7.  The pt said she'd been doing well since then.  She followed up with her cardiologist on 10/14 and no new meds were added.  She felt her HR and it felt fast and irregular and felt like she went into afib again.     Past Medical History:  Diagnosis Date  . Allergy   . Anemia   . Arthritis   . B12 deficiency   . Complication of anesthesia    slow to wake up   . Depression   . Diabetes mellitus type 2, diet-controlled ([place]) 07/24/2018  . Diabetes mellitus without complication ([place])    type II - diet controlled ; A1C 6.5 01/2017  . Fibromyalgia   . Generalized hyperhidrosis 04/17/2014  . GERD (gastroesophageal reflux disease)   . Heart murmur    Aortic Sclerosis  . Hyperlipemia   . HYPOTHYROIDISM 05/29/2007  . Irritable bowel syndrome   . Migraines    hx of   . Oxygen deficiency    2 L at bedtime  . 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 PONV (postoperative nausea and vomiting)   . PVC (premature ventricular contraction)   . Type II or unspecified type diabetes mellitus with neurological manifestations, not stated as uncontrolled(250.60) 04/07/2013  . Unspecified essential hypertension    denies htn on 05/09/16    Patient Active Problem List   Diagnosis Date Noted  . Atrial fibrillation with RVR ([place]) 04/03/2019  . Flutter-fibrillation ([place]) 04/01/2019  . Diabetes mellitus type 2, diet-controlled ([place]) 07/24/2018  . Status post bunionectomy 04/24/2018  . Postoperative wound infection 04/24/2018  . Lipoma of  buttock 01/10/2017  . Chronic respiratory failure with hypoxia ([place]) 04/07/2015  . Obesity hypoventilation syndrome ([place]) 03/26/2015  . Generalized hyperhidrosis 04/17/2014  . Obesity (BMI 30-39.9) 08/23/2013  . 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 Postlaminectomy syndrome, cervical region 08/29/2011  . Dysphagia 07/14/2010  . TRANSAMINASES, SERUM, ELEVATED 12/24/2008  . Depression, major, recurrent, in partial remission ([place]) 09/22/2008  . ALLERGIC RHINITIS 09/22/2008  . Hypothyroidism 05/29/2007  . HYPERLIPIDEMIA 05/29/2007  . Vitamin D deficiency 01/11/2007  . FIBROMYALGIA 01/11/2007  . Vitamin B 12 deficiency 11/13/2006  . Essential hypertension 11/13/2006  . GERD 11/13/2006    Past Surgical History:  Procedure Laterality Date  . [place] STUDY N/A 01/04/2016   Procedure: [place] STUDY;  Surgeon: Manus Gunning, MD;  Location: [facility] ENDOSCOPY;  Service: Gastroenterology;  Laterality: N/A;  . ABDOMINAL HYSTERECTOMY  1988  . ADENOIDECTOMY    . ANTERIOR CERVICAL DECOMP/DISCECTOMY FUSION  2009, 1993   first procedure in ~ 1993/Dr Botero  . APPENDECTOMY  1988  . BACK SURGERY    . CARDIOVERSION N/A 04/03/2019   Procedure: CARDIOVERSION;  Surgeon: Dorothy Spark, MD;  Location: [facility] ENDOSCOPY;  Service: Cardiovascular;  Laterality: N/A;  . CHOLECYSTECTOMY  2005  . ESOPHAGEAL MANOMETRY N/A 01/04/2016  Procedure: ESOPHAGEAL MANOMETRY (EM) 24 HR PH;  Surgeon: Manus Gunning, MD;  Location: [facility] ENDOSCOPY;  Service: Gastroenterology;  Laterality: N/A;  . ESOPHAGOGASTRODUODENOSCOPY (EGD) WITH PROPOFOL N/A 05/17/2016   Procedure: ESOPHAGOGASTRODUODENOSCOPY (EGD) WITH PROPOFOL;  Surgeon: Manus Gunning, MD;  Location: [facility] ENDOSCOPY;  Service: Gastroenterology;  Laterality: N/A;  . NASAL SINUS SURGERY  2006  . SAVORY DILATION N/A 05/17/2016   Procedure: SAVORY DILATION;  Surgeon: Manus Gunning, MD;  Location: [facility]  ENDOS[facility];  Service: Gastroenterology;  Laterality: N/A;  . TEE WITHOUT CARDIOVERSION N/A 04/03/2019   Procedure: TRANSESOPHAGEAL ECHOCARDIOGRAM (TEE);  Surgeon: Dorothy Spark, MD;  Location: [place];  Service: Cardiovascular;  Laterality: N/A;  . TONSILLECTOMY AND ADENOIDECTOMY    . TRANSTHORACIC ECHOCARDIOGRAM  2009   Normal LV size and function. EF 60-65%. No regional wall motion and amount is. Mild aortic sclerosis, no stenosis  . TUBAL LIGATION  1979     OB History   No obstetric history on file.      Home Medications    Prior to Admission medications   Medication Sig Start Date End Date Taking? Authorizing Provider  acetaminophen (TYLENOL) 650 MG CR tablet Take 650 mg by mouth every 8 (eight) hours as needed (back and neck pain).    Yes [provider]  apixaban (ELIQUIS) 5 MG TABS tablet Take 1 tablet (5 mg total) by mouth 2 (two) times daily. 04/04/19  Yes Nicole Kindred A, DO  B Complex Vitamins (B COMPLEX 100 PO) Take 1 tablet by mouth every morning.    Yes [provider]  bisacodyl (DULCOLAX) 5 MG EC tablet Take 5-10 mg by mouth at bedtime.    Yes [provider]  calcium carbonate (TUMS EX) 750 MG chewable tablet Chew 1-2 tablets by mouth 2 (two) times daily as needed for heartburn (acid reflux).   Yes [provider]  Calcium Carbonate-Vitamin D (CALCIUM 600+D PO) Take 2 tablets by mouth at bedtime.   Yes [provider]  carboxymethylcellulose (REFRESH PLUS) 0.5 % SOLN Place 1 drop into both eyes 3 (three) times daily as needed (dry eyes).    Yes [provider]  cetirizine (ZYRTEC) 10 MG tablet Take 10 mg by mouth at bedtime.    Yes [provider]  Cholecalciferol (VITAMIN D) 125 MCG (5000 UT) CAPS Take 5,000 Units by mouth every morning.    Yes [provider]  citalopram (CELEXA) 40 MG tablet Take 1 tablet (40 mg total) by mouth daily. Patient taking differently: Take 40 mg by mouth every  morning.  04/04/19  Yes Nicole Kindred A, DO  Coenzyme Q-10 100 MG capsule Take 100 mg by mouth every morning.    Yes [provider]  diclofenac sodium (VOLTAREN) 1 % GEL Apply 4 g topically 4 (four) times daily as needed (joint pain).    Yes [provider]  diltiazem (CARDIZEM CD) 240 MG 24 hr capsule Take 1 capsule (240 mg total) by mouth daily. Patient taking differently: Take 240 mg by mouth every morning.  04/05/19  Yes Nicole Kindred A, DO  docusate sodium (COLACE) 100 MG capsule Take 200 mg by mouth at bedtime.    Yes [provider]  gabapentin (NEURONTIN) 300 MG capsule TAKE 1 CAPSULE BY MOUTH 3  TIMES DAILY Patient taking differently: Take 300 mg by mouth 3 (three) times daily.  02/12/19  Yes Bayard Hugger, NP  GRAPE SEED EXTRACT PO Take 1 tablet by mouth every morning.  Yes [provider]  levothyroxine (SYNTHROID) 50 MCG tablet TAKE 1 TABLET BY MOUTH  DAILY Patient taking differently: Take 50 mcg by mouth daily before breakfast.  02/12/19  Yes Copland, Frederico Hamman, MD  loratadine (CLARITIN) 10 MG tablet Take 10 mg by mouth every morning.    Yes [provider]  Magnesium Bisglycinate (MAG GLYCINATE PO) Take 400 mg by mouth at bedtime. 2 X 200 mg   Yes [provider]  methocarbamol (ROBAXIN) 500 MG tablet TAKE ONE (1) TABLET BY MOUTH EVERY EIGHTHOURS AS NEEDED FOR MUSCLE SPASMS Patient taking differently: Take 500 mg by mouth 3 (three) times daily.  06/13/18  Yes Bayard Hugger, NP  mirtazapine (REMERON) 15 MG tablet Take 1 tablet (15 mg total) by mouth at bedtime. 04/04/19  Yes Nicole Kindred A, DO  morphine (MS CONTIN) 15 MG 12 hr tablet Take 1 tablet (15 mg total) by mouth 3 (three) times daily. 03/19/19  Yes Bayard Hugger, NP  nitroGLYCERIN (NITROSTAT) 0.4 MG SL tablet Place 1 tablet (0.4 mg total) under the tongue every 5 (five) minutes as needed for chest pain. 06/13/18  Yes [name], MD  oxybutynin (DITROPAN-XL)  10 MG 24 hr tablet Take 1 tablet (10 mg total) by mouth at bedtime. 08/24/18  Yes Copland, Frederico Hamman, MD  RABEprazole (ACIPHEX) 20 MG tablet Take 1 tablet (20 mg total) by mouth daily. Patient taking differently: Take 20 mg by mouth every morning.  02/13/19  Yes Copland, Frederico Hamman, MD  vitamin C (ASCORBIC ACID) 500 MG tablet Take 500 mg by mouth 2 (two) times daily.   Yes [provider]    Family History Family History  Problem Relation Age of Onset  . Breast cancer Mother   . Diabetes Mother   . Heart disease Mother   . Allergies Mother   . Diabetes Father   . Heart disease Father   . Emphysema Father   . Allergies Father   . Stomach cancer Maternal Uncle   . Breast cancer Paternal Aunt   . Colon cancer Neg Hx     Social History Social History   Tobacco Use  . Smoking status: Never Smoker  . Smokeless tobacco: Never Used  Substance Use Topics  . Alcohol use: Not Currently    Alcohol/week: 0.0 standard drinks    Comment: rare  . Drug use: Yes    Types: Morphine     Allergies   Rizatriptan benzoate, Sumatriptan, Tizanidine, Aleve [naproxen sodium], Clarithromycin, Cyclobenzaprine hcl, Doxycycline, Duloxetine, Keflex [cephalexin], Metronidazole, Oxycodone hcl, Oxycontin [oxycodone hcl], Penicillins, Rosuvastatin, Venlafaxine, Voltaren [diclofenac sodium], Septra [sulfamethoxazole-trimethoprim], and Tramadol   Review of Systems Review of Systems  Cardiovascular: Positive for palpitations.  All other systems reviewed and are negative.    Physical Exam Updated Vital Signs BP (!) 108/53   Pulse 63   Temp 98.1 F (36.7 C) (Oral)   Resp 10   SpO2 94%   Physical Exam Vitals signs and nursing note reviewed.  Constitutional:      Appearance: Normal appearance.  HENT:     Head: Normocephalic and atraumatic.     Right Ear: External ear normal.     Left Ear: External ear normal.     Nose: Nose normal.     Mouth/Throat:     Mouth: Mucous membranes are moist.      Pharynx: Oropharynx is clear.  Eyes:     Extraocular Movements: Extraocular movements intact.     Conjunctiva/sclera: Conjunctivae normal.  Pupils: Pupils are equal, round, and reactive to light.  Neck:     Musculoskeletal: Normal range of motion and neck supple.  Cardiovascular:     Rate and Rhythm: Normal rate and regular rhythm.     Pulses: Normal pulses.     Heart sounds: Normal heart sounds.  Pulmonary:     Effort: Pulmonary effort is normal.     Breath sounds: Normal breath sounds.  Abdominal:     General: Abdomen is flat. Bowel sounds are normal.     Palpations: Abdomen is soft.  Musculoskeletal: Normal range of motion.  Skin:    General: Skin is warm.     Capillary Refill: Capillary refill takes less than 2 seconds.  Neurological:     General: No focal deficit present.     Mental Status: She is alert and oriented to person, place, and time.  Psychiatric:        Mood and Affect: Mood normal.        Behavior: Behavior normal.      ED Treatments / Results  Labs (all labs ordered are listed, but only abnormal results are displayed) Labs Reviewed  BASIC METABOLIC PANEL - Abnormal; Notable for the following components:      Result Value   Glucose, Bld 285 (*)    All other components within normal limits  CBC  TSH  MAGNESIUM  TROPONIN I (HIGH SENSITIVITY)  TROPONIN I (HIGH SENSITIVITY)    EKG EKG Interpretation  Date/Time:  Saturday April 13 2019 11:33:54 EDT Ventricular Rate:  70 PR Interval:    QRS Duration: 92 QT Interval:  381 QTC Calculation: 412 R Axis:   53 Text Interpretation:  Sinus rhythm No significant change since last tracing Confirmed by Isla Pence [phone]) on 04/13/2019 11:53:52 AM   Radiology Dg Chest 2 View  Result Date: 04/13/2019 CLINICAL DATA:  Atrial fibrillation. EXAM: CHEST - 2 VIEW COMPARISON:  04/01/2019 FINDINGS: The heart size and mediastinal contours are within normal limits. Aortic atherosclerosis. Both lungs are  clear. Cervical spine fusion hardware noted. IMPRESSION: No active cardiopulmonary disease. Electronically Signed   By: Marlaine Hind M.D.   On: 04/13/2019 12:06    Procedures Procedures (including critical care time)  Medications Ordered in ED Medications  sodium chloride flush (NS) 0.9 % injection 3 mL (3 mLs Intravenous Not Given 04/13/19 1132)  sodium chloride 0.9 % bolus 1,000 mL (1,000 mLs Intravenous New Bag/Given 04/13/19 1131)     Initial Impression / Assessment and Plan / ED Course  I have reviewed the triage vital signs and the nursing notes.  Pertinent labs & imaging results that were available during my care of the patient were reviewed by me and considered in my medical decision making (see chart for details).    Pt has been in NSR while here.  She is feeling better.  I suspect she may be going in and out of afib at home.  She is to f/u with cards.  Return if worse.  Final Clinical Impressions(s) / ED Diagnoses   Final diagnoses:  Paroxysmal atrial fibrillation [facility])    ED Discharge Orders    None       Isla Pence, MD 04/13/19 1431

## 2019-04-13 NOTE — ED Triage Notes (Signed)
Pt states she has been in Afib since yesterday.  C/o dizziness, nausea, vomiting, and increased HR.  States she had cardioversion last week.

## 2019-04-13 NOTE — Telephone Encounter (Signed)
Pt back in a fib, dizzy, nausea and vomiting thinks she is in a fib, started feeling bad last night.  I have asked her to come to ER for eval.

## 2019-04-15 ENCOUNTER — Encounter: Payer: Self-pay | Admitting: Family Medicine

## 2019-04-15 ENCOUNTER — Other Ambulatory Visit: Payer: Self-pay

## 2019-04-15 ENCOUNTER — Ambulatory Visit (INDEPENDENT_AMBULATORY_CARE_PROVIDER_SITE_OTHER): Payer: Medicare Other | Admitting: Family Medicine

## 2019-04-15 VITALS — BP 128/62 | HR 86 | Temp 98.4°F | Ht 65.0 in | Wt 219.0 lb

## 2019-04-15 DIAGNOSIS — I4892 Unspecified atrial flutter: Secondary | ICD-10-CM | POA: Diagnosis not present

## 2019-04-15 DIAGNOSIS — I4891 Unspecified atrial fibrillation: Secondary | ICD-10-CM

## 2019-04-15 DIAGNOSIS — IMO0002 Reserved for concepts with insufficient information to code with codable children: Secondary | ICD-10-CM

## 2019-04-15 NOTE — Patient Instructions (Signed)

## 2019-04-15 NOTE — Telephone Encounter (Signed)
Alicia Pelton, NP  You 17 minutes ago (1:16 PM)   She has a follow up with Dr. Ellyn Hack on 05/10/2019. She was in NSR when she was seen in the ED. As long as she takes her medication as prescribed she is ok to keep that appointment. She doesn't need to see [place] sooner.   [name]

## 2019-04-15 NOTE — Progress Notes (Signed)
[name] [name], MD Primary Care and [place] [facility] [place], [address] Phone: [phone]   FAX: [phone]  [name] - 68 y.o. female   MRN [id number]   Date of Birth: [birth year]  Visit Date: 04/15/2019   PCP: Alicia Loffler, MD   Referred by: Alicia Loffler, MD  Chief Complaint  Patient presents with   Hospitalization Follow-up   Subjective:   [name] is a 68 y.o. very pleasant female patient who presents with the following:  BMP and CBC Cardioverted on 04/03/2019.  AF with RVR.  She was diagnosed on April 01, 2019.  She had A. fib with RVR, and she ultimately had a TEE and was cardioverted on April 03, 2019.  Prior to this, she had been doing quite well.  On April 10, 2019 she had follow-up with cardiology.  Of important note, the patient actually made an error in her medication and took 480 mg of Cardizem at the wrong hour of the day.  Subsequently she felt tired and weak and went to the emergency room.  Messed her medication up.  Took her cardizem early.  2 in one day. Felt tired and weak.  Lab Results  Component Value Date   HGBA1C 7.0 (H) 04/02/2019    BS some 200's in hosp.  04/13/2019 ER visit: Pt presents to the ED today with dizziness and not feeling well.  She has a hx of afib with RVR that was diagnosed on 10/5.  She had a TEE and was cardioverted on 10/7.  The pt said she'd been doing well since then.  She followed up with her cardiologist on 10/14 and no new meds were added.  She felt her HR and it felt fast and irregular and felt like she went into afib again.  Admit date: 04/01/2019 Discharge date: 04/04/2019 Principal Problem:   Atrial fibrillation with RVR ([place]) Active Problems:   Essential hypertension   TRANSAMINASES, SERUM, ELEVATED   Hypothyroidism   HYPERLIPIDEMIA   GERD   Obesity (BMI 30-39.9)   Diabetes mellitus type 2, diet-controlled ([place])  Flutter-fibrillation ([place])  Past Medical History, Surgical History, Social History, Family History, Problem List, Medications, and Allergies have been reviewed and updated if relevant.  Patient Active Problem List   Diagnosis Date Noted   Postlaminectomy syndrome, cervical region 08/29/2011    Priority: High   FIBROMYALGIA 01/11/2007    Priority: High   Depression, major, recurrent, in partial remission ([place]) 09/22/2008    Priority: Medium   Hypothyroidism 05/29/2007    Priority: Medium   HYPERLIPIDEMIA 05/29/2007    Priority: Medium   Essential hypertension 11/13/2006    Priority: Medium   Atrial fibrillation with RVR ([place]) 04/03/2019   Flutter-fibrillation ([place]) 04/01/2019   Diabetes mellitus type 2, diet-controlled ([place]) 07/24/2018   Obesity hypoventilation syndrome ([place]) 03/26/2015   Generalized hyperhidrosis 04/17/2014   Obesity (BMI 30-39.9) 08/23/2013   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 08/29/2011   TRANSAMINASES, SERUM, ELEVATED 12/24/2008   ALLERGIC RHINITIS 09/22/2008   Vitamin D deficiency 01/11/2007   Vitamin B 12 deficiency 11/13/2006   GERD 11/13/2006    Past Medical History:  Diagnosis Date   Allergy    Anemia    Arthritis    B12 deficiency    Complication of anesthesia    slow to wake up    Depression    Diabetes mellitus type 2, diet-controlled ([place]) 07/24/2018  Diabetes mellitus without complication ([place])    type II - diet controlled ; A1C 6.5 01/2017   Fibromyalgia    Generalized hyperhidrosis 04/17/2014   GERD (gastroesophageal reflux disease)    Heart murmur    Aortic Sclerosis   Hyperlipemia    HYPOTHYROIDISM 05/29/2007   Irritable bowel syndrome    Migraines    hx of    Oxygen deficiency    2 L at bedtime   Oxygen dependent    uses 2l at hs due to MS Contin per patient   PONV (postoperative nausea and vomiting)    PVC (premature ventricular contraction)    Type II or  unspecified type diabetes mellitus with neurological manifestations, not stated as uncontrolled(250.60) 04/07/2013   Unspecified essential hypertension    denies htn on 05/09/16    Past Surgical History:  Procedure Laterality Date   24 HOUR [place] STUDY N/A 01/04/2016   Procedure: 24 HOUR [place] STUDY;  Surgeon: Manus Gunning, MD;  Location: [facility];  Service: Gastroenterology;  Laterality: N/A;   ABDOMINAL HYSTERECTOMY  1988   ADENOIDECTOMY     ANTERIOR CERVICAL DECOMP/DISCECTOMY FUSION  2009, 1993   first procedure in ~ 1993/Dr [place] N/A 04/03/2019   Procedure: CARDIOVERSION;  Surgeon: Dorothy Spark, MD;  Location: [facility];  Service: Cardiovascular;  Laterality: N/A;   CHOLECYSTECTOMY  2005   ESOPHAGEAL MANOMETRY N/A 01/04/2016   Procedure: ESOPHAGEAL MANOMETRY (EM) 24 HR PH;  Surgeon: Manus Gunning, MD;  Location: [facility];  Service: Gastroenterology;  Laterality: N/A;   ESOPHAGOGASTRODUODENOSCOPY (EGD) WITH PROPOFOL N/A 05/17/2016   Procedure: ESOPHAGOGASTRODUODENOSCOPY (EGD) WITH PROPOFOL;  Surgeon: Manus Gunning, MD;  Location: [facility];  Service: Gastroenterology;  Laterality: N/A;   NASAL SINUS SURGERY  2006   SAVORY DILATION N/A 05/17/2016   Procedure: SAVORY DILATION;  Surgeon: Manus Gunning, MD;  Location: [facility];  Service: Gastroenterology;  Laterality: N/A;   TEE WITHOUT CARDIOVERSION N/A 04/03/2019   Procedure: TRANSESOPHAGEAL ECHOCARDIOGRAM (TEE);  Surgeon: Dorothy Spark, MD;  Location: [facility];  Service: Cardiovascular;  Laterality: N/A;   TONSILLECTOMY AND ADENOIDECTOMY     TRANSTHORACIC ECHOCARDIOGRAM  2009   Normal LV size and function. EF 60-65%. No regional wall motion and amount is. Mild aortic sclerosis, no stenosis   TUBAL LIGATION  1979    Social History   Socioeconomic History   Marital status: Married    Spouse name:  Not on file   Number of children: Not on file   Years of education: Not on file   Highest education level: Not on file  Occupational History   Occupation: Radiation protection practitioner   Occupation: retired  Scientist, product/process development strain: Not hard at [employer] insecurity    Worry: Never true    Inability: Never true   Transportation needs    Medical: No    Non-medical: No  Tobacco Use   Smoking status: Never Smoker   Smokeless tobacco: Never Used  Substance and Sexual Activity   Alcohol use: Not Currently    Alcohol/week: 0.0 standard drinks    Comment: rare   Drug use: Yes    Types: Morphine   Sexual activity: Not Currently  Lifestyle   Physical activity    Days per week: 0 days    Minutes per session: 0 min   Stress: Not at all  Relationships   Social  connections    Talks on phone: Not on file    Gets together: Not on file    Attends religious service: Not on file    Active member of club or organization: Not on file    Attends meetings of clubs or organizations: Not on file    Relationship status: Not on file   Intimate partner violence    Fear of current or ex partner: No    Emotionally abused: No    Physically abused: No    Forced sexual activity: No  Other Topics Concern   Not on file  Social History Narrative   Not on file    Family History  Problem Relation Age of Onset   Breast cancer Mother    Diabetes Mother    Heart disease Mother    Allergies Mother    Diabetes Father    Heart disease Father    Emphysema Father    Allergies Father    Stomach cancer Maternal Uncle    Breast cancer Paternal Aunt    Colon cancer Neg Hx     Allergies  Allergen Reactions   Rizatriptan Benzoate Anaphylaxis    Throat and Tongue Swelling Closed   Sumatriptan Swelling    Throat closed & Tongue Swelling   Tizanidine Shortness Of Breath   Aleve [Naproxen Sodium] Hives   Clarithromycin Hives   Cyclobenzaprine Hcl Swelling     Reaction - throat closed, tongue swelling   Doxycycline Diarrhea and Nausea And Vomiting   Duloxetine Nausea Only   Keflex [Cephalexin] Itching and Other (See Comments)    Sores   Metronidazole Diarrhea and Nausea Only   Oxycodone Hcl Diarrhea and Nausea And Vomiting   Oxycontin [Oxycodone Hcl] Nausea And Vomiting   Penicillins Other (See Comments)    Unknown reaction Did it involve swelling of the face/tongue/throat, SOB, or low BP? Unknown Did it involve sudden or severe rash/hives, skin peeling, or any reaction on the inside of your mouth or nose? Unknown Did you need to seek medical attention at a hospital or doctor's office? Unknown When did it last happen?infant If all above answers are NO, may proceed with cephalosporin use.   Rosuvastatin Other (See Comments)    Muscle aches   Venlafaxine Hives   Voltaren [Diclofenac Sodium] Hives    Reaction to oral med, uses gel occasionally   Septra [Sulfamethoxazole-Trimethoprim] Itching and Rash   Tramadol Diarrhea, Nausea And Vomiting and Palpitations    Medication list reviewed and updated in full in [place].   GEN: No acute illnesses, no fevers, chills. GI: No n/v/d, eating normally Pulm: No SOB Interactive and getting along well at home.  Otherwise, ROS is as per the HPI.  Objective:   BP 128/62    Pulse 86    Temp 98.4 F (36.9 C) (Temporal)    Ht 5\' 5"  (1.651 m)    Wt 219 lb (99.3 kg)    SpO2 96%    BMI 36.44 kg/m   GEN: WDWN, NAD, Non-toxic, A & O x 3 HEENT: Atraumatic, Normocephalic. Neck supple. No masses, No LAD. Ears and Nose: No external deformity. CV: RRR, No M/G/R. No JVD. No thrill. No extra heart sounds. PULM: CTA B, no wheezes, crackles, rhonchi. No retractions. No resp. distress. No accessory muscle use. EXTR: No c/c/e NEURO Normal gait.  PSYCH: Normally interactive. Conversant. Not depressed or anxious appearing.  Calm demeanor.   Laboratory and Imaging Data:  Assessment  and Plan:  ICD-10-CM   1. Atrial fibrillation with RVR (HCC)  I48.91   2. Flutter-fibrillation (HCC)  I48.91    I48.92    >25 minutes spent in face to face time with patient, >50% spent in counselling or coordination of care: Globally she is doing well.  I reviewed all her medications with her again today face-to-face.  She did have her cardioversion.  Today her exam is regular rate and rhythm.  Clearly not in A. fib.  She still does feel somewhat tired and weak.  Believe that her doubling her dose of Cardizem likely cause this sensation that drove her to the hospital.  Follow-up: No follow-ups on file.  No orders of the defined types were placed in this encounter.  No orders of the defined types were placed in this encounter.   Signed,  Maud Deed. Eduardo Wurth, MD   Outpatient Encounter Medications as of 04/15/2019  Medication Sig   acetaminophen (TYLENOL) 650 MG CR tablet Take 650 mg by mouth every 8 (eight) hours as needed (back and neck pain).    apixaban (ELIQUIS) 5 MG TABS tablet Take 1 tablet (5 mg total) by mouth 2 (two) times daily.   B Complex Vitamins (B COMPLEX 100 PO) Take 1 tablet by mouth every morning.    bisacodyl (DULCOLAX) 5 MG EC tablet Take 5-10 mg by mouth at bedtime.    calcium carbonate (TUMS EX) 750 MG chewable tablet Chew 1-2 tablets by mouth 2 (two) times daily as needed for heartburn (acid reflux).   Calcium Carbonate-Vitamin D (CALCIUM 600+D PO) Take 2 tablets by mouth at bedtime.   carboxymethylcellulose (REFRESH PLUS) 0.5 % SOLN Place 1 drop into both eyes 3 (three) times daily as needed (dry eyes).    cetirizine (ZYRTEC) 10 MG tablet Take 10 mg by mouth at bedtime.    Cholecalciferol (VITAMIN D) 125 MCG (5000 UT) CAPS Take 5,000 Units by mouth every morning.    citalopram (CELEXA) 40 MG tablet Take 1 tablet (40 mg total) by mouth daily. (Patient taking differently: Take 40 mg by mouth every morning. )   Coenzyme Q-10 100 MG capsule Take 100  mg by mouth every morning.    diclofenac sodium (VOLTAREN) 1 % GEL Apply 4 g topically 4 (four) times daily as needed (joint pain).    diltiazem (CARDIZEM CD) 240 MG 24 hr capsule Take 1 capsule (240 mg total) by mouth daily. (Patient taking differently: Take 240 mg by mouth every morning. )   docusate sodium (COLACE) 100 MG capsule Take 200 mg by mouth at bedtime.    gabapentin (NEURONTIN) 300 MG capsule TAKE 1 CAPSULE BY MOUTH 3  TIMES DAILY (Patient taking differently: Take 300 mg by mouth 3 (three) times daily. )   GRAPE SEED EXTRACT PO Take 1 tablet by mouth every morning.    levothyroxine (SYNTHROID) 50 MCG tablet TAKE 1 TABLET BY MOUTH  DAILY (Patient taking differently: Take 50 mcg by mouth daily before breakfast. )   loratadine (CLARITIN) 10 MG tablet Take 10 mg by mouth every morning.    Magnesium Bisglycinate (MAG GLYCINATE PO) Take 400 mg by mouth at bedtime. 2 X 200 mg   methocarbamol (ROBAXIN) 500 MG tablet TAKE ONE (1) TABLET BY MOUTH EVERY EIGHTHOURS AS NEEDED FOR MUSCLE SPASMS (Patient taking differently: Take 500 mg by mouth 3 (three) times daily. )   mirtazapine (REMERON) 15 MG tablet Take 1 tablet (15 mg total) by mouth at bedtime.   morphine (MS CONTIN) 15 MG 12  hr tablet Take 1 tablet (15 mg total) by mouth 3 (three) times daily.   nitroGLYCERIN (NITROSTAT) 0.4 MG SL tablet Place 1 tablet (0.4 mg total) under the tongue every 5 (five) minutes as needed for chest pain.   oxybutynin (DITROPAN-XL) 10 MG 24 hr tablet Take 1 tablet (10 mg total) by mouth at bedtime.   RABEprazole (ACIPHEX) 20 MG tablet Take 1 tablet (20 mg total) by mouth daily. (Patient taking differently: Take 20 mg by mouth every morning. )   vitamin C (ASCORBIC ACID) 500 MG tablet Take 500 mg by mouth 2 (two) times daily.   No facility-administered encounter medications on file as of 04/15/2019.

## 2019-04-18 ENCOUNTER — Encounter: Payer: Self-pay | Admitting: Registered Nurse

## 2019-04-18 ENCOUNTER — Encounter: Payer: Medicare Other | Attending: Physical Medicine & Rehabilitation | Admitting: Registered Nurse

## 2019-04-18 ENCOUNTER — Other Ambulatory Visit: Payer: Self-pay

## 2019-04-18 VITALS — BP 129/75 | HR 75 | Temp 97.7°F | Ht 65.0 in | Wt 218.0 lb

## 2019-04-18 DIAGNOSIS — Z79891 Long term (current) use of opiate analgesic: Secondary | ICD-10-CM

## 2019-04-18 DIAGNOSIS — G8929 Other chronic pain: Secondary | ICD-10-CM

## 2019-04-18 DIAGNOSIS — Z79899 Other long term (current) drug therapy: Secondary | ICD-10-CM | POA: Diagnosis not present

## 2019-04-18 DIAGNOSIS — M546 Pain in thoracic spine: Secondary | ICD-10-CM | POA: Diagnosis not present

## 2019-04-18 DIAGNOSIS — M961 Postlaminectomy syndrome, not elsewhere classified: Secondary | ICD-10-CM

## 2019-04-18 DIAGNOSIS — G894 Chronic pain syndrome: Secondary | ICD-10-CM | POA: Diagnosis not present

## 2019-04-18 DIAGNOSIS — Z5181 Encounter for therapeutic drug level monitoring: Secondary | ICD-10-CM

## 2019-04-18 MED ORDER — MORPHINE SULFATE ER 15 MG PO TBCR: 15.0000 mg | EXTENDED_RELEASE_TABLET | Freq: Three times a day (TID) | ORAL | 0 refills | Status: DC

## 2019-04-18 NOTE — Progress Notes (Signed)
Subjective:    Patient ID: [name], female    DOB: 04/18/1951, 68 y.o.   MRN: [id number]  HPI: [name] is a 68 y.o. female who returns for follow up appointment for chronic pain and medication refill. She states her pain is located in her mid-back, She rates her pain 6. Her current exercise regime is walking and performing stretching exercises.  [name] was admitted to [facility] on 04/01/2019 and discharged on 04/04/2019 for Atrial fibrillation with RVR, she underwent cardioversion. She was discharged on Eliquis and Cardizem. Discharge summary was reviewed.   [name] Morphine equivalent is 45.00  MME. Last UDS was Performed on 02/19/2019, it was consistent.    Pain Inventory Average Pain 6 Pain Right Now 6 My pain is constant, burning and aching  In the last 24 hours, has pain interfered with the following? General activity 8 Relation with others 8 Enjoyment of life 8 What TIME of day is your pain at its worst? night Sleep (in general) Good  Pain is worse with: walking, bending, standing and some activites Pain improves with: rest, heat/ice, medication, TENS and injections Relief from Meds: 5  Mobility walk without assistance ability to climb steps?  yes do you drive?  yes  Function retired  Neuro/Psych bladder control problems weakness numbness tremor dizziness  Prior Studies Any changes since last visit?  no  Physicians involved in your care Any changes since last visit?  no   Family History  Problem Relation Age of Onset  . Breast cancer Mother   . Diabetes Mother   . Heart disease Mother   . Allergies Mother   . Diabetes Father   . Heart disease Father   . Emphysema Father   . Allergies Father   . Stomach cancer Maternal Uncle   . Breast cancer Paternal Aunt   . Colon cancer Neg Hx    Social History   Socioeconomic History  . Marital status: Married    Spouse name: Not on file  . Number of children: Not on file  . Years  of education: Not on file  . Highest education level: Not on file  Occupational History  . Occupation: Radiation protection practitioner  . Occupation: retired  Scientific laboratory technician  . Financial resource strain: Not hard at all  . Food insecurity    Worry: Never true    Inability: Never true  . Transportation needs    Medical: No    Non-medical: No  Tobacco Use  . Smoking status: Never Smoker  . Smokeless tobacco: Never Used  Substance and Sexual Activity  . Alcohol use: Not Currently    Alcohol/week: 0.0 standard drinks    Comment: rare  . Drug use: Yes    Types: Morphine  . Sexual activity: Not Currently  Lifestyle  . Physical activity    Days per week: 0 days    Minutes per session: 0 min  . Stress: Not at all  Relationships  . Social Herbalist on phone: Not on file    Gets together: Not on file    Attends religious service: Not on file    Active member of club or organization: Not on file    Attends meetings of clubs or organizations: Not on file    Relationship status: Not on file  Other Topics Concern  . Not on file  Social History Narrative  . Not on file   Past Surgical History:  Procedure Laterality Date  .  [place] STUDY N/A 01/04/2016   Procedure: [place] STUDY;  Surgeon: Manus Gunning, MD;  Location: [facility];  Service: Gastroenterology;  Laterality: N/A;  . ABDOMINAL HYSTERECTOMY  1988  . ADENOIDECTOMY    . ANTERIOR CERVICAL DECOMP/DISCECTOMY FUSION  2009, 1993   first procedure in ~ 1993/Dr [place]  . APPENDECTOMY  1988  . BACK SURGERY    . CARDIOVERSION N/A 04/03/2019   Procedure: CARDIOVERSION;  Surgeon: Dorothy Spark, MD;  Location: [facility];  Service: Cardiovascular;  Laterality: N/A;  . CHOLECYSTECTOMY  2005  . ESOPHAGEAL MANOMETRY N/A 01/04/2016   Procedure: ESOPHAGEAL MANOMETRY (EM) 24 HR PH;  Surgeon: Manus Gunning, MD;  [facility]: [facility];  Service: Gastroenterology;  Laterality: N/A;  . ESOPHAGOGASTRODUODENOSCOPY (EGD)  WITH PROPOFOL N/A 05/17/2016   Procedure: ESOPHAGOGASTRODUODENOSCOPY (EGD) WITH PROPOFOL;  Surgeon: Manus Gunning, MD;  [facility];  Service: Gastroenterology;  Laterality: N/A;  . NASAL SINUS SURGERY  2006  . SAVORY DILATION N/A 05/17/2016   Procedure: SAVORY DILATION;  Surgeon: Manus Gunning, MD;  Location: [facility];  Service: Gastroenterology;  Laterality: N/A;  . TEE WITHOUT CARDIOVERSION N/A 04/03/2019   Procedure: TRANSESOPHAGEAL ECHOCARDIOGRAM (TEE);  Surgeon: Dorothy Spark, MD;  Location: [facility];  Service: Cardiovascular;  Laterality: N/A;  . TONSILLECTOMY AND ADENOIDECTOMY    . TRANSTHORACIC ECHOCARDIOGRAM  2009   Normal LV size and function. EF 60-65%. No regional wall motion and amount is. Mild aortic sclerosis, no stenosis  . TUBAL LIGATION  1979   Past Medical History:  Diagnosis Date  . Allergy   . Anemia   . Arthritis   . B12 deficiency   . Complication of anesthesia    slow to wake up   . Depression   . Diabetes mellitus type 2, diet-controlled ([place]) 07/24/2018  . Diabetes mellitus without complication ([place])    type II - diet controlled ; A1C 6.5 01/2017  . Fibromyalgia   . Generalized hyperhidrosis 04/17/2014  . GERD (gastroesophageal reflux disease)   . Heart murmur    Aortic Sclerosis  . Hyperlipemia   . HYPOTHYROIDISM 05/29/2007  . Irritable bowel syndrome   . Migraines    hx of   . Oxygen deficiency    2 L at bedtime  . 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 PONV (postoperative nausea and vomiting)   . PVC (premature ventricular contraction)   . Type II or unspecified type diabetes mellitus with neurological manifestations, not stated as uncontrolled(250.60) 04/07/2013  . Unspecified essential hypertension    denies htn on 05/09/16   There were no vitals taken for this visit.  Opioid Risk Score:   Fall Risk Score:  `1  Depression screen PHQ 2/9  Depression screen [facility] 2/9  01/18/2019 08/08/2018 04/24/2018 01/10/2018 11/24/2017 03/27/2017 02/28/2017  Decreased Interest 0 0 0 2 1 0 1  Down, Depressed, Hopeless 0 0 0 0 1 0 1  PHQ - 2 Score 0 0 0 2 2 0 2  Altered sleeping 0 - - 0 - - -  Tired, decreased energy 3 - - 1 - - -  Change in appetite 0 - - 0 - - -  Feeling bad or failure about yourself  0 - - 0 - - -  Trouble concentrating 0 - - 1 - - -  Moving slowly or fidgety/restless 0 - - 0 - - -  Suicidal thoughts 0 - -  0 - - -  PHQ-9 Score 3 - - 4 - - -  Difficult doing work/chores Not difficult at all - - Not difficult at all Not difficult at all - -  Some recent data might be hidden     Review of Systems  Constitutional: Negative.   HENT: Negative.   Eyes: Negative.   Respiratory: Negative.   Cardiovascular: Positive for leg swelling.  Gastrointestinal: Positive for constipation and nausea.  Endocrine: Negative.   Genitourinary: Positive for difficulty urinating.  Musculoskeletal: Positive for arthralgias and back pain.  Skin: Positive for rash.  Allergic/Immunologic: Negative.   Neurological: Positive for tremors, weakness and numbness.  Hematological: Negative.   Psychiatric/Behavioral: Positive for dysphoric mood.  All other systems reviewed and are negative.      Objective:   Physical Exam Vitals signs and nursing note reviewed.  Constitutional:      Appearance: Normal appearance.  Neck:     Musculoskeletal: Normal range of motion and neck supple.  Cardiovascular:     Rate and Rhythm: Normal rate and regular rhythm.     Pulses: Normal pulses.     Heart sounds: Normal heart sounds.  Pulmonary:     Effort: Pulmonary effort is normal.     Breath sounds: Normal breath sounds.  Musculoskeletal:     Comments: Normal Muscle Bulk and Muscle Testing Reveals:  Upper Extremities: Full ROM and Muscle Strength 5/5 Thoracic Paraspinal Tenderness: T-7-T-9  Lower Extremities: Full ROM and Muscle Strength 5/5 Arises from Table with ease Narrow Based   Gait   Skin:    General: Skin is warm and dry.  Neurological:     Mental Status: She is alert and oriented to person, place, and time.  Psychiatric:        Mood and Affect: Mood normal.        Behavior: Behavior normal.           Assessment & Plan:  1. Cervical postlaminectomy syndrome:/ Cervicalgia/ Cervical Radiculitis/with neck and shoulder pain/ Chronic Midline Thoracic Pain.04/18/2019.Continue exercise routine and using heat therapy.Refilled:current medication regimen withMS Contin 15 mg one tablet three times a day #90.  We will continue the opioid monitoring program, this consists of regular clinic visits, examinations, urine drug screen, pill counts as well as use of [place] Controlled Substance reporting System. 2. Fibromyalgia/ Neuropathy: Continuecurrent medication regimen withGabapentin, activity and exercise regime.04/18/2019. 3.BilateralKnee Pain/ Mild Degenerative Changes:.Continuecurrent medication regimen withVoltaren Gel.04/18/2019. 4.BilateralAnkle Pain:No complaints today.Continue HEP as tolerated. Continueto monitor.04/18/2019. 5. Muscle Spasm: Continuecurrent medication regimen withRobaxin. 04/18/2019. 6. Opioid Induced Constipation: No complaints Continue to Monitor.04/18/2019. 7. Midline Low Back Pain: Continue current medication regime, continue HEP as tolerated.04/18/2019. 8. Right Gluteal Medius Weakness and Atrophy: No complaints Today:Continue with Hip Abduction Exercisesas tolerated: Continue to Monitor.04/18/2019 9. Right Greater Trochanter Bursitis:No Complaints Today.Continue to Alternate Ice and Heat Therapy. Continue to Monitor. 04/18/2019 10. Sacroiliac Joint Pain:No complaints today.S/PSacroiliac Injection on 08/24/2018 with good relief noted. 04/18/2019 11. Chronic low back pain without sciatica:No complaints today.Continue HEP as tolerated. Continue current medication regimen. Continue to  monitor.04/18/2019.  15 minutes of face to face patient care time was spent during this visit. All questions were encouraged and answered.  F/U in 1 month

## 2019-04-24 ENCOUNTER — Other Ambulatory Visit: Payer: Self-pay

## 2019-04-24 ENCOUNTER — Encounter: Payer: Self-pay | Admitting: Pulmonary Disease

## 2019-04-24 ENCOUNTER — Ambulatory Visit: Payer: Medicare Other | Admitting: Pulmonary Disease

## 2019-04-24 VITALS — BP 130/76 | HR 72 | Temp 97.2°F | Ht 65.0 in | Wt 221.2 lb

## 2019-04-24 DIAGNOSIS — R0683 Snoring: Secondary | ICD-10-CM

## 2019-04-24 DIAGNOSIS — J9610 Chronic respiratory failure, unspecified whether with hypoxia or hypercapnia: Secondary | ICD-10-CM | POA: Diagnosis not present

## 2019-04-24 NOTE — Progress Notes (Signed)
[facility]  Chief Complaint  Patient presents with  . Sleep related hypoxia    Recently seen in hospital, was not on oxygen for 4 days. Wants to know if she should continue using on oxygen or not.    Constitutional:  BP 130/76 (BP Location: Right Arm, Patient Position: Sitting, Cuff Size: Normal)   Pulse 72   Temp (!) 97.2 F (36.2 C)   Ht 5\' 5"  (1.651 m)   Wt 221 lb 3.2 oz (100.3 kg)   SpO2 96% Comment: on room air  BMI 36.81 kg/m   Past Medical History:  B12 deficiency, Depression, Fibromyalgia, GERD, IBS, HTN, HLD, Hypothyroidism, Migraine HA, DM, A fib  Brief Summary:  [name] is a 68 y.o. 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  She was in hospital earlier this month with new onset A fib.  She didn't use oxygen while in the hospital.  She had continuous pulse oximeter while in hospital.  She is snoring more and husband worried she makes funny noise with her breathing at night.  She is sleepy all the time during the day.   Physical Exam:   Appearance - well kempt   ENMT - no sinus tenderness, no nasal discharge, no oral exudate  Neck - no masses, trachea midline, no thyromegaly, no elevation in JVP  Respiratory - normal appearance of chest wall, normal respiratory effort w/o accessory muscle use, no dullness on percussion, no wheezing or rales  CV - s1s2 regular rate and rhythm, 2/6 murmurs, no peripheral edema, radial pulses symmetric  GI - soft, non tender  Lymph - no adenopathy noted in neck and axillary areas  MSK - normal gait  Ext - no cyanosis, clubbing, or joint inflammation noted  Skin - no rashes, lesions, or ulcers  Neuro - normal strength, oriented x 3  Psych - normal mood and affect   Assessment/Plan:   Snoring. - has been on 2 liters oxygen at night - previously sleep study was negative for significant sleep apnea, but she did have  some respiratory events - she was recently diagnosed with a fib - she is having increased snoring, sleep disruption, apnea, and daytime sleepiness - I am concerned she could now have significant obstructive sleep apnea - will arrange for home sleep study to further assess - will then determine if she would need to consider PAP therapy or resume supplemental oxygen at night (was on 2 liters)   Patient Instructions  Will arrange for home sleep study Will call to arrange for follow up after sleep study reviewed    A total of  26 minutes were spent face to face with the patient and more than half of that time involved counseling or coordination of care.   Chesley Mires, MD [facility] Pager: [phone] 04/24/2019, 3:00 PM  Flow Sheet    Sleep tests:  PSG 02/26/15 >> AHI 4.4, SaO2 low 83%. Spent 23.47min with SpO2 <88%. ONO with RA 03/11/15 >> test time 8 hrs 50 min. Baseline SpO2 92%, low SpO2 78%. Spent 24 min with SpO2 < 88%.  Cardiac tests:  TEE 04/03/19 >> EF 55 to 60%, mod LA dilation, mild MR, mild/mod TR, mild AR  Medications:   Allergies as of 04/24/2019      Reactions   Rizatriptan Benzoate Anaphylaxis   Throat and Tongue Swelling Closed   Sumatriptan Swelling   Throat closed &  Tongue Swelling   Tizanidine Shortness Of Breath   Aleve [naproxen Sodium] Hives   Clarithromycin Hives   Cyclobenzaprine Hcl Swelling   Reaction - throat closed, tongue swelling   Doxycycline Diarrhea, Nausea And Vomiting   Duloxetine Nausea Only   Keflex [cephalexin] Itching, Other (See Comments)   Sores   Metronidazole Diarrhea, Nausea Only   Oxycodone Hcl Diarrhea, Nausea And Vomiting   Oxycontin [oxycodone Hcl] Nausea And Vomiting   Penicillins Other (See Comments)   Unknown reaction Did it involve swelling of the face/tongue/throat, SOB, or low BP? Unknown Did it involve sudden or severe rash/hives, skin peeling, or any reaction on the inside of your mouth or  nose? Unknown Did you need to seek medical attention at a hospital or doctor's office? Unknown When did it last happen?infant If all above answers are "NO", may proceed with cephalosporin use.   Rosuvastatin Other (See Comments)   Muscle aches   Venlafaxine Hives   Voltaren [diclofenac Sodium] Hives   Reaction to oral med, uses gel occasionally   Septra [sulfamethoxazole-trimethoprim] Itching, Rash   Tramadol Diarrhea, Nausea And Vomiting, Palpitations      Medication List       Accurate as of April 24, 2019  3:00 PM. If you have any questions, ask your nurse or doctor.        acetaminophen 650 MG CR tablet Commonly known as: TYLENOL Take 650 mg by mouth every 8 (eight) hours as needed (back and neck pain).   apixaban 5 MG Tabs tablet Commonly known as: ELIQUIS Take 1 tablet (5 mg total) by mouth 2 (two) times daily.   B COMPLEX 100 PO Take 1 tablet by mouth every morning.   bisacodyl 5 MG EC tablet Commonly known as: DULCOLAX Take 5-10 mg by mouth at bedtime.   CALCIUM 600+D PO Take 2 tablets by mouth at bedtime.   calcium carbonate 750 MG chewable tablet Commonly known as: TUMS EX Chew 1-2 tablets by mouth 2 (two) times daily as needed for heartburn (acid reflux).   carboxymethylcellulose 0.5 % Soln Commonly known as: REFRESH PLUS Place 1 drop into both eyes 3 (three) times daily as needed (dry eyes).   cetirizine 10 MG tablet Commonly known as: ZYRTEC Take 10 mg by mouth at bedtime.   citalopram 40 MG tablet Commonly known as: CELEXA Take 1 tablet (40 mg total) by mouth daily. What changed: when to take this   Coenzyme Q-10 100 MG capsule Take 100 mg by mouth every morning.   diclofenac sodium 1 % Gel Commonly known as: VOLTAREN Apply 4 g topically 4 (four) times daily as needed (joint pain).   diltiazem 240 MG 24 hr capsule Commonly known as: CARDIZEM CD Take 1 capsule (240 mg total) by mouth daily. What changed: when to take this    docusate sodium 100 MG capsule Commonly known as: COLACE Take 200 mg by mouth at bedtime.   gabapentin 300 MG capsule Commonly known as: NEURONTIN TAKE 1 CAPSULE BY MOUTH 3  TIMES DAILY   GRAPE SEED EXTRACT PO Take 1 tablet by mouth every morning.   levothyroxine 50 MCG tablet Commonly known as: SYNTHROID TAKE 1 TABLET BY MOUTH  DAILY What changed: when to take this   loratadine 10 MG tablet Commonly known as: CLARITIN Take 10 mg by mouth every morning.   MAG GLYCINATE PO Take 400 mg by mouth at bedtime. 2 X 200 mg   methocarbamol 500 MG tablet Commonly known as: ROBAXIN TAKE  ONE (1) TABLET BY MOUTH EVERY EIGHTHOURS AS NEEDED FOR MUSCLE SPASMS What changed:   how much to take  how to take this  when to take this  additional instructions   mirtazapine 15 MG tablet Commonly known as: REMERON Take 1 tablet (15 mg total) by mouth at bedtime.   morphine 15 MG 12 hr tablet Commonly known as: MS Contin Take 1 tablet (15 mg total) by mouth 3 (three) times daily.   nitroGLYCERIN 0.4 MG SL tablet Commonly known as: NITROSTAT Place 1 tablet (0.4 mg total) under the tongue every 5 (five) minutes as needed for chest pain.   oxybutynin 10 MG 24 hr tablet Commonly known as: DITROPAN-XL Take 1 tablet (10 mg total) by mouth at bedtime.   RABEprazole 20 MG tablet Commonly known as: ACIPHEX Take 1 tablet (20 mg total) by mouth daily. What changed: when to take this   vitamin C 500 MG tablet Commonly known as: ASCORBIC ACID Take 500 mg by mouth 2 (two) times daily.   Vitamin D 125 MCG (5000 UT) Caps Take 5,000 Units by mouth every morning.       Past Surgical History:  She  has a past surgical history that includes Appendectomy (1988); Cholecystectomy (2005); Tubal ligation (1979); Tonsillectomy and adenoidectomy; Anterior cervical decomp/discectomy fusion (2009, 1993); Nasal sinus surgery (2006); Abdominal hysterectomy (1988); Esophageal manometry (N/A, 01/04/2016);  24 hour ph study (N/A, 01/04/2016); transthoracic echocardiogram (2009); Back surgery; Adenoidectomy; Esophagogastroduodenoscopy (egd) with propofol (N/A, 05/17/2016); Savory dilation (N/A, 05/17/2016); TEE without cardioversion (N/A, 04/03/2019); and Cardioversion (N/A, 04/03/2019).  Family History:  Her family history includes Allergies in her father and mother; Breast cancer in her mother and paternal aunt; Diabetes in her father and mother; Emphysema in her father; Heart disease in her father and mother; Stomach cancer in her maternal uncle.  Social History:  She  reports that she has never smoked. She has never used smokeless tobacco. She reports previous alcohol use. She reports current drug use. Drug: Morphine.

## 2019-04-24 NOTE — Patient Instructions (Signed)
Will arrange for home sleep study Will call to arrange for follow up after sleep study reviewed  

## 2019-04-27 ENCOUNTER — Other Ambulatory Visit: Payer: Self-pay | Admitting: Registered Nurse

## 2019-04-28 ENCOUNTER — Encounter: Payer: Self-pay | Admitting: Registered Nurse

## 2019-05-10 ENCOUNTER — Ambulatory Visit: Payer: Medicare Other | Admitting: Cardiology

## 2019-05-10 ENCOUNTER — Other Ambulatory Visit: Payer: Self-pay

## 2019-05-10 VITALS — BP 122/62 | HR 76 | Ht 65.0 in | Wt 210.0 lb

## 2019-05-10 DIAGNOSIS — E782 Mixed hyperlipidemia: Secondary | ICD-10-CM | POA: Diagnosis not present

## 2019-05-10 DIAGNOSIS — I10 Essential (primary) hypertension: Secondary | ICD-10-CM

## 2019-05-10 DIAGNOSIS — I480 Paroxysmal atrial fibrillation: Secondary | ICD-10-CM | POA: Insufficient documentation

## 2019-05-10 DIAGNOSIS — I4811 Longstanding persistent atrial fibrillation: Secondary | ICD-10-CM | POA: Insufficient documentation

## 2019-05-10 NOTE — Progress Notes (Signed)
Primary Care Provider: Owens Loffler, MD Cardiologist: Glenetta Hew, MD Electrophysiologist:   Clinic Note: Chief Complaint  Patient presents with  . Follow-up    Doing well  . Atrial Fibrillation    No further episodes    HPI:    [name] is a 68 y.o. female with a PMH notable for fibromyalgia, chronic pain, nocturnal hypoxia and diet-controlled DM-2 who presents today for hospital follow-up with new diagnosis of A. fib.[name] was last seen on by me in Dec 2019 as CP f/u.  She was still having intermittent episodes of chest discomfort but nothing consistent.  To evaluate chest pain, we ordered a coronary CT angiogram which was performed and showed minimal disease, reviewed below.  Recent Hospitalizations:   October 5-8, 2020: Had 9-day history of fatigue lightheadedness with diarrhea for 1week.  Also noted palpitations.  Noted some chest discomfort, went to urgent care on 10/5 and noted to be tachycardic with rates in the 150s--sent to ER notably atrial flutter.  Started on IV diltiazem, cardiology consulted.    TEE-DCCV 10/7  Started on Eliquis and switched to Cardizem CD 240 mg, would consider antiarrhythmic if recurrence.  Seen for hospital follow-up by Odie Sera, FNP on 10/14 was feeling well.  A few occasional flutters that were relatively short-lived.  No sustained arrhythmia.  No further chest pain or dyspnea. -->  Consider lipid clinic follow-up  -> She went back to the emergency room on 10/17 with dizziness and not feeling well.  As it turned out, she had accidentally taken a second dose of her diltiazem as documented in her PCPs note)   Reviewed  CV studies:    The following studies were reviewed today: (if available, images/films reviewed: From Epic Chart or Care Everywhere) . Coronary CT Angiogram July 11, 2018: Coronary calcium score 63.  Nonobstructive coronary disease.  No significant plaque . TEE-DCCV 04/23/2019: EF 55 to 60%.   No LVH.  Normal RV.  Moderate LA dilation.  No thrombus.  Mild RA dilation.  Mild to moderate TR.  Mild aortic valve sclerosis, no stenosis.  Moderately elevated PA pressures (47 mmHg). o Successful DCCV   Interval History:   [name] presents here today for follow-up from the second ER visit, indicating that she is doing well now.  She understood what happened with taking the extra medication and how that made her feel.  Now that she has taken a stable dose, she is doing well.  No recurrent rapid heart rates palpitations.  She does feel intermittent skipped beats but not that much.   Lipid she had her A. fib she felt a fluttering sensation she felt short of breath, had a headache lightheaded and dizzy.  She has not had any of those symptoms.  She does have some mild puffy lower leg swelling.  Since being out of A. fib, she has not had any further chest pain or pressure.  No dyspnea.  CV Review of Symptoms (Summary)  no chest pain or dyspnea on exertion positive for - Mild swelling and occasional skipped beats, but no prolonged arrhythmias negative for - orthopnea, paroxysmal nocturnal dyspnea, rapid heart rate, shortness of breath or Syncope/near syncope, TIA/amaurosis fugax, claudication.  The patient does not have symptoms concerning for COVID-19 infection (fever, chills, cough, or new shortness of breath).  The patient is practicing social distancing. ++ Masking.  ++ Groceries/shopping.   REVIEWED OF SYSTEMS   A comprehensive ROS was performed. Review  of Systems  Constitutional: Negative for malaise/fatigue (Has been doing well with exception that 1 day she took extra meds.).  HENT: Negative for congestion and nosebleeds.   Respiratory: Negative for shortness of breath.   Gastrointestinal: Negative for blood in stool, diarrhea, heartburn, melena, nausea and vomiting.  Genitourinary: Negative for hematuria.  Musculoskeletal: Positive for joint pain (Still has foot/ankle pain  from surgery that did not go well.).  Neurological: Negative for dizziness, focal weakness and weakness.  Endo/Heme/Allergies: Does not bruise/bleed easily.  Psychiatric/Behavioral: Negative.   All other systems reviewed and are negative.  I have reviewed and (if needed) personally updated the patient's problem list, medications, allergies, past medical and surgical history, social and family history.   PAST MEDICAL HISTORY   Past Medical History:  Diagnosis Date  . Allergy   . Anemia   . Arthritis   . B12 deficiency   . Complication of anesthesia    slow to wake up   . Depression   . Diabetes mellitus type 2, diet-controlled ([place]) 07/24/2018  . Fibromyalgia   . Generalized hyperhidrosis 04/17/2014  . GERD (gastroesophageal reflux disease)   . Hyperlipemia   . HYPOTHYROIDISM 05/29/2007  . Irritable bowel syndrome   . Migraines    hx of   . Oxygen deficiency    2 L at bedtime  . PVC (premature ventricular contraction)   . Senile calcific aortic valve sclerosis    Aortic Sclerosis-no stenosis  . Unspecified essential hypertension    denies htn on 05/09/16     PAST SURGICAL HISTORY   Past Surgical History:  Procedure Laterality Date  . [place] STUDY N/A 01/04/2016   Procedure: [place] STUDY;  Surgeon: Manus Gunning, MD;  Location: [facility] ENDOSCOPY;  Service: Gastroenterology;  Laterality: N/A;  . ABDOMINAL HYSTERECTOMY  1988  . ADENOIDECTOMY    . ANTERIOR CERVICAL DECOMP/DISCECTOMY FUSION  2009, 1993   first procedure in ~ 1993/Dr Botero  . APPENDECTOMY  1988  . BACK SURGERY    . CARDIOVERSION N/A 04/03/2019   Procedure: CARDIOVERSION;  Surgeon: Dorothy Spark, MD;  Location: [facility] ENDOSCOPY;  Service: Cardiovascular;  Laterality: N/A;  . CHOLECYSTECTOMY  2005  . ESOPHAGEAL MANOMETRY N/A 01/04/2016   Procedure: ESOPHAGEAL MANOMETRY (EM) 24 HR PH;  Surgeon: Manus Gunning, MD;  Location: [facility] ENDOSCOPY;  Service: Gastroenterology;  Laterality: N/A;  .  ESOPHAGOGASTRODUODENOSCOPY (EGD) WITH PROPOFOL N/A 05/17/2016   Procedure: ESOPHAGOGASTRODUODENOSCOPY (EGD) WITH PROPOFOL;  Surgeon: Manus Gunning, MD;  Location: [facility] ENDOSCOPY;  Service: Gastroenterology;  Laterality: N/A;  . NASAL SINUS SURGERY  2006  . SAVORY DILATION N/A 05/17/2016   Procedure: SAVORY DILATION;  Surgeon: Manus Gunning, MD;  Location: [facility];  Service: Gastroenterology;  Laterality: N/A;  . TEE WITHOUT CARDIOVERSION N/A 04/03/2019   Procedure: TRANSESOPHAGEAL ECHOCARDIOGRAM (TEE)-with DCCV;  Surgeon: Dorothy Spark, MD;  Location: [facility] ENDOSCOPY;; EF 55 to 60%.  No LVH.  Normal RV.  Moderate LA dilation.  No thrombus.  Mild RA dilation.  Mild to moderate TR.  Mild aortic valve sclerosis, no stenosis.  Moderately elevated PA pressures (47 mmHg)  . TONSILLECTOMY AND ADENOIDECTOMY    . TRANSTHORACIC ECHOCARDIOGRAM  2009   Normal LV size and function. EF 60-65%. No regional wall motion and amount is. Mild aortic sclerosis, no stenosis  . TUBAL LIGATION  1979    MEDICATIONS/ALLERGIES   Current Meds  Medication Sig  . acetaminophen (TYLENOL) 650 MG CR tablet Take 650 mg  by mouth every 8 (eight) hours as needed (back and neck pain).   Marland Kitchen apixaban (ELIQUIS) 5 MG TABS tablet Take 1 tablet (5 mg total) by mouth 2 (two) times daily.  . B Complex Vitamins (B COMPLEX 100 PO) Take 1 tablet by mouth every morning.   . bisacodyl (DULCOLAX) 5 MG EC tablet Take 5-10 mg by mouth at bedtime.   . calcium carbonate (TUMS EX) 750 MG chewable tablet Chew 1-2 tablets by mouth 2 (two) times daily as needed for heartburn (acid reflux).  . Calcium Carbonate-Vitamin D (CALCIUM 600+D PO) Take 2 tablets by mouth at bedtime.  . carboxymethylcellulose (REFRESH PLUS) 0.5 % SOLN Place 1 drop into both eyes 3 (three) times daily as needed (dry eyes).   . cetirizine (ZYRTEC) 10 MG tablet Take 10 mg by mouth at bedtime.   . Cholecalciferol (VITAMIN D) 125 MCG (5000 UT) CAPS Take 5,000  Units by mouth every morning.   . citalopram (CELEXA) 40 MG tablet Take 1 tablet (40 mg total) by mouth daily. (Patient taking differently: Take 40 mg by mouth every morning. )  . Coenzyme Q-10 100 MG capsule Take 100 mg by mouth every morning.   . diclofenac sodium (VOLTAREN) 1 % GEL Apply 4 g topically 4 (four) times daily as needed (joint pain).   Marland Kitchen diltiazem (CARDIZEM CD) 240 MG 24 hr capsule Take 1 capsule (240 mg total) by mouth daily. (Patient taking differently: Take 240 mg by mouth every morning. )  . docusate sodium (COLACE) 100 MG capsule Take 200 mg by mouth at bedtime.   . gabapentin (NEURONTIN) 300 MG capsule TAKE 1 CAPSULE BY MOUTH 3  TIMES DAILY (Patient taking differently: Take 300 mg by mouth 3 (three) times daily. )  . GRAPE SEED EXTRACT PO Take 1 tablet by mouth every morning.   Marland Kitchen levothyroxine (SYNTHROID) 50 MCG tablet TAKE 1 TABLET BY MOUTH  DAILY (Patient taking differently: Take 50 mcg by mouth daily before breakfast. )  . loratadine (CLARITIN) 10 MG tablet Take 10 mg by mouth every morning.   . Magnesium Bisglycinate (MAG GLYCINATE PO) Take 400 mg by mouth at bedtime. 2 X 200 mg  . methocarbamol (ROBAXIN) 500 MG tablet TAKE ONE (1) TABLET BY MOUTH EVERY EIGHTHOURS AS NEEDED FOR MUSCLE SPASMS  . mirtazapine (REMERON) 15 MG tablet Take 1 tablet (15 mg total) by mouth at bedtime.  Marland Kitchen morphine (MS CONTIN) 15 MG 12 hr tablet Take 1 tablet (15 mg total) by mouth 3 (three) times daily.  Marland Kitchen oxybutynin (DITROPAN-XL) 10 MG 24 hr tablet Take 1 tablet (10 mg total) by mouth at bedtime.  . RABEprazole (ACIPHEX) 20 MG tablet Take 1 tablet (20 mg total) by mouth daily. (Patient taking differently: Take 20 mg by mouth every morning. )  . vitamin C (ASCORBIC ACID) 500 MG tablet Take 500 mg by mouth 2 (two) times daily.    Allergies  Allergen Reactions  . Rizatriptan Benzoate Anaphylaxis    Throat and Tongue Swelling Closed  . Sumatriptan Swelling    Throat closed & Tongue Swelling  .  Tizanidine Shortness Of Breath  . Aleve [Naproxen Sodium] Hives  . Clarithromycin Hives  . Cyclobenzaprine Hcl Swelling    Reaction - throat closed, tongue swelling  . Doxycycline Diarrhea and Nausea And Vomiting  . Duloxetine Nausea Only  . Keflex [Cephalexin] Itching and Other (See Comments)    Sores  . Metronidazole Diarrhea and Nausea Only  . Oxycodone Hcl Diarrhea and Nausea  And Vomiting  . Oxycontin [Oxycodone Hcl] Nausea And Vomiting  . Penicillins Other (See Comments)    Unknown reaction Did it involve swelling of the face/tongue/throat, SOB, or low BP? Unknown Did it involve sudden or severe rash/hives, skin peeling, or any reaction on the inside of your mouth or nose? Unknown Did you need to seek medical attention at a hospital or doctor's office? Unknown When did it last happen?infant If all above answers are "NO", may proceed with cephalosporin use.  . Rosuvastatin Other (See Comments)    Muscle aches  . Venlafaxine Hives  . Voltaren [Diclofenac Sodium] Hives    Reaction to oral med, uses gel occasionally  . Septra [Sulfamethoxazole-Trimethoprim] Itching and Rash  . Tramadol Diarrhea, Nausea And Vomiting and Palpitations    SOCIAL HISTORY/FAMILY HISTORY   Social History   Tobacco Use  . Smoking status: Never Smoker  . Smokeless tobacco: Never Used  Substance Use Topics  . Alcohol use: Not Currently    Alcohol/week: 0.0 standard drinks    Comment: rare  . Drug use: Yes    Types: Morphine   Social History   Social History Narrative  . Not on file    Family History family history includes Allergies in her father and mother; Breast cancer in her mother and paternal aunt; Diabetes in her father and mother; Emphysema in her father; Heart disease in her father and mother; Stomach cancer in her maternal uncle.   OBJCTIVE -PE, EKG, labs   Wt Readings from Last 3 Encounters:  05/10/19 210 lb (95.3 kg)  04/24/19 221 lb 3.2 oz (100.3 kg)  04/18/19 218 lb  (98.9 kg)    Physical Exam: BP 122/62   Pulse 76   Ht 5\' 5"  (1.651 m)   Wt 210 lb (95.3 kg)   SpO2 97%   BMI 34.95 kg/m  Physical Exam  Constitutional: She is oriented to person, place, and time. She appears well-developed and well-nourished. No distress.  Well-groomed.  Moderately obese.  Otherwise healthy-appearing.  HENT:  Head: Normocephalic and atraumatic.  Neck: Normal range of motion. Neck supple. No hepatojugular reflux and no JVD present. Carotid bruit is not present.  Cardiovascular: Normal rate, regular rhythm, normal heart sounds and intact distal pulses.  No extrasystoles are present. PMI is not displaced. Exam reveals no gallop and no friction rub.  No murmur heard. Pulmonary/Chest: Breath sounds normal. No respiratory distress. She has no wheezes. She has no rales.  Abdominal: Soft. Bowel sounds are normal. She exhibits no distension. There is no abdominal tenderness. There is no rebound.  Musculoskeletal: Normal range of motion.        General: No edema.  Neurological: She is alert and oriented to person, place, and time.  Psychiatric: She has a normal mood and affect. Her behavior is normal. Judgment and thought content normal.  Vitals reviewed.   Adult ECG Report  Rate: 75;  Rhythm: normal sinus rhythm and Normal axis, intervals and durations.;   Narrative Interpretation: Normal EKG  Recent Labs:  Lab Results  Component Value Date   CHOL 186 01/18/2019   HDL 43.10 01/18/2019   LDLCALC 97 01/10/2018   LDLDIRECT 125.0 01/18/2019   TRIG 209.0 (H) 01/18/2019   CHOLHDL 4 01/18/2019   Lab Results  Component Value Date   CREATININE 0.89 04/13/2019   BUN 14 04/13/2019   NA 136 04/13/2019   K 4.0 04/13/2019   CL 100 04/13/2019   CO2 25 04/13/2019    ASSESSMENT/PLAN  Problem List Items Addressed This Visit    PAF (paroxysmal atrial fibrillation) (HCC) - CHA2DS2-VASc Score 4 (HTN, DM, Age & Female) (Chronic)    Relatively new diagnosis.  I suspect she  had relatively prolonged spell of it before she was cardioverted.  Now maintained on diltiazem 240 mg daily along with Eliquis for anticoagulation.  This patients CHA2DS2-VASc Score and unadjusted Ischemic Stroke Rate (% per year) is equal to 4.8 % stroke rate/year from a score of 4  Above score calculated as 1 point each if present [CHF, HTN, DM, Vascular=MI/PAD/Aortic Plaque, Age if 65-74, or Female] Above score calculated as 2 points each if present [Age > 75, or Stroke/TIA/TE]   We discussed plans for breakthrough episodes of A. fib.  The plan will be for her to contact the office for instructions and likely come in for an EKG.  If confirmed, we commenced potentially schedule cardioversion.  However, if this occurs on off hours, or on the weekend, would recommend going to the emergency room where she can likely be cardioverted in the ER.  Where she did have recurrent episodes, we would only consider the possibility of antiarrhythmic agent such as probably flecainide.--She has normal echo noted normal coronary artery calcium score/coronary CTA.       Relevant Orders   EKG 12-Lead   HYPERLIPIDEMIA (Chronic)    Coronary calcium score puts her in low to intermediate risk, but now with A. fib, would like to see her LDL below 100.  Most recent check was little bit contradictory.  In July 2019 her calculated LDL was 97 and now this year in July her LDL direct was 125.  I would like to recheck lipids to see which direction they have gone.  With multiple allergies, I would probably consider starting with either low-dose rosuvastatin and/or Zetia if not at goal.      Relevant Orders   Lipid panel   Hepatic function panel   Essential hypertension - Primary (Chronic)    Blood pressure looks pretty good.  She is now on diltiazem bilateral see any other medications.  Need to try to avoid taking extra doses of diltiazem as it is long-acting          COVID-19 Education: The signs and symptoms of  COVID-19 were discussed with the patient and how to seek care for testing (follow up with PCP or arrange E-visit).   The importance of social distancing was discussed today.  I spent a total of 65minutes with the patient and chart review. >  50% of the time was spent in direct patient consultation.  Additional time spent with chart review (studies, outside notes, etc): 8 Total Time: 26 min   Current medicines are reviewed at length with the patient today.  (+/- concerns) n/a   Patient Instructions / Medication Changes & Studies & Tests Ordered   Patient Instructions  Medication Instructions:   NO CHANGES WITH CURRENT MEDICATIONS  IF YOU DEVELOP ATRIAL FIBRILLATION BREAKTHROUGH -- CONTACT THE OFFICE FOR NEXT STEP -  EVEN ON WEEK ENDS OR AFFER HOURS  *If you need a refill on your cardiac medications before your next appointment, please call your pharmacy*  Lab Work: Lipid Hepatic  Both in FEB 2021 - FASTING  If you have labs (blood work) drawn today and your tests are completely normal, you will receive your results only by: Marland Kitchen MyChart Message (if you have MyChart) OR . A paper copy in the mail If you have any lab test  that is abnormal or we need to change your treatment, we will call you to review the results.  Testing/Procedures: Not needed  Follow-Up: At [facility], you and your health needs are our priority.  As part of our continuing mission to provide you with exceptional heart care, we have created designated Provider Care Teams.  These Care Teams include your primary Cardiologist (physician) and Advanced Practice Providers (APPs -  Physician Assistants and Nurse Practitioners) who all work together to provide you with the care you need, when you need it.  Your next appointment:   4 months  The format for your next appointment:   Virtual Visit   Provider:   Odie Sera FNP  Other Instructions  8 month appointment with Dr Ellyn Hack  In person   Studies  Ordered:   Orders Placed This Encounter  Procedures  . Lipid panel  . Hepatic function panel  . EKG 12-Lead     Glenetta Hew, M.D., M.S. Interventional Cardiologist   Pager # [phone] Phone # [phone] [place], [place] [address]   Thank you for choosing Heartcare at [facility]!!

## 2019-05-10 NOTE — Patient Instructions (Signed)
Medication Instructions:   NO CHANGES WITH CURRENT MEDICATIONS  IF YOU DEVELOP ATRIAL FIBRILLATION BREAKTHROUGH -- CONTACT THE OFFICE FOR NEXT STEP -  EVEN ON WEEK ENDS OR AFFER HOURS  *If you need a refill on your cardiac medications before your next appointment, please call your pharmacy*  Lab Work: Lipid Hepatic  Both in FEB 2021 - FASTING  If you have labs (blood work) drawn today and your tests are completely normal, you will receive your results only by: Marland Kitchen MyChart Message (if you have MyChart) OR . A paper copy in the mail If you have any lab test that is abnormal or we need to change your treatment, we will call you to review the results.  Testing/Procedures: Not needed  Follow-Up: At [facility], you and your health needs are our priority.  As part of our continuing mission to provide you with exceptional heart care, we have created designated Provider Care Teams.  These Care Teams include your primary Cardiologist (physician) and Advanced Practice Providers (APPs -  Physician Assistants and Nurse Practitioners) who all work together to provide you with the care you need, when you need it.  Your next appointment:   4 months  The format for your next appointment:   Virtual Visit   Provider:   Odie Sera FNP  Other Instructions  8 month appointment with Dr Ellyn Hack  In person

## 2019-05-12 ENCOUNTER — Encounter: Payer: Self-pay | Admitting: Cardiology

## 2019-05-12 NOTE — Assessment & Plan Note (Addendum)
Relatively new diagnosis.  I suspect she had relatively prolonged spell of it before she was cardioverted.  Now maintained on diltiazem 240 mg daily along with Eliquis for anticoagulation.  This patients CHA2DS2-VASc Score and unadjusted Ischemic Stroke Rate (% per year) is equal to 4.8 % stroke rate/year from a score of 4  Above score calculated as 1 point each if present [CHF, HTN, DM, Vascular=MI/PAD/Aortic Plaque, Age if 65-74, or Female] Above score calculated as 2 points each if present [Age > 75, or Stroke/TIA/TE]   We discussed plans for breakthrough episodes of A. fib.  The plan will be for her to contact the office for instructions and likely come in for an EKG.  If confirmed, we commenced potentially schedule cardioversion.  However, if this occurs on off hours, or on the weekend, would recommend going to the emergency room where she can likely be cardioverted in the ER.  Where she did have recurrent episodes, we would only consider the possibility of antiarrhythmic agent such as probably flecainide.--She has normal echo noted normal coronary artery calcium score/coronary CTA.

## 2019-05-12 NOTE — Assessment & Plan Note (Signed)
Blood pressure looks pretty good.  She is now on diltiazem bilateral see any other medications.  Need to try to avoid taking extra doses of diltiazem as it is long-acting

## 2019-05-12 NOTE — Assessment & Plan Note (Signed)
Coronary calcium score puts her in low to intermediate risk, but now with A. fib, would like to see her LDL below 100.  Most recent check was little bit contradictory.  In July 2019 her calculated LDL was 97 and now this year in July her LDL direct was 125.  I would like to recheck lipids to see which direction they have gone.  With multiple allergies, I would probably consider starting with either low-dose rosuvastatin and/or Zetia if not at goal.

## 2019-05-15 ENCOUNTER — Encounter: Payer: Medicare Other | Attending: Physical Medicine & Rehabilitation | Admitting: Registered Nurse

## 2019-05-15 ENCOUNTER — Other Ambulatory Visit: Payer: Self-pay

## 2019-05-15 ENCOUNTER — Encounter: Payer: Self-pay | Admitting: Registered Nurse

## 2019-05-15 VITALS — BP 124/71 | HR 62 | Temp 97.8°F | Ht 65.0 in | Wt 224.0 lb

## 2019-05-15 DIAGNOSIS — Z79899 Other long term (current) drug therapy: Secondary | ICD-10-CM | POA: Diagnosis not present

## 2019-05-15 DIAGNOSIS — M546 Pain in thoracic spine: Secondary | ICD-10-CM | POA: Diagnosis not present

## 2019-05-15 DIAGNOSIS — M961 Postlaminectomy syndrome, not elsewhere classified: Secondary | ICD-10-CM

## 2019-05-15 DIAGNOSIS — M542 Cervicalgia: Secondary | ICD-10-CM

## 2019-05-15 DIAGNOSIS — L821 Other seborrheic keratosis: Secondary | ICD-10-CM | POA: Diagnosis not present

## 2019-05-15 DIAGNOSIS — M25561 Pain in right knee: Secondary | ICD-10-CM

## 2019-05-15 DIAGNOSIS — M25562 Pain in left knee: Secondary | ICD-10-CM

## 2019-05-15 DIAGNOSIS — M5412 Radiculopathy, cervical region: Secondary | ICD-10-CM

## 2019-05-15 DIAGNOSIS — G894 Chronic pain syndrome: Secondary | ICD-10-CM

## 2019-05-15 DIAGNOSIS — Z79891 Long term (current) use of opiate analgesic: Secondary | ICD-10-CM | POA: Diagnosis not present

## 2019-05-15 DIAGNOSIS — L218 Other seborrheic dermatitis: Secondary | ICD-10-CM | POA: Diagnosis not present

## 2019-05-15 DIAGNOSIS — G8929 Other chronic pain: Secondary | ICD-10-CM

## 2019-05-15 DIAGNOSIS — Z5181 Encounter for therapeutic drug level monitoring: Secondary | ICD-10-CM

## 2019-05-15 MED ORDER — MORPHINE SULFATE ER 15 MG PO TBCR: 15.0000 mg | EXTENDED_RELEASE_TABLET | Freq: Three times a day (TID) | ORAL | 0 refills | Status: DC

## 2019-05-15 NOTE — Progress Notes (Signed)
Subjective:    Patient ID: [name], female    DOB: [birth year], 68 y.o.   MRN: [id number]  HPI: [name] is a 68 y.o. female who returns for follow up appointment for chronic pain and medication refill. She states her pain is located in her neck radiating into her right shoulder, mid- back and bilateral knees. Also reports increase intensity of mid-back pain, she was instructed to keep a pain journal and call office in two weeks, she verbalizes understanding. She rates her pain 6. Her current exercise regime is walking and performing stretching exercises.  [name] Morphine equivalent is 45.00 MME.  UDS ordered today.   Pain Inventory Average Pain 6 Pain Right Now 6 My pain is constant, burning and aching  In the last 24 hours, has pain interfered with the following? General activity 6 Relation with others 6 Enjoyment of life 6 What TIME of day is your pain at its worst? evening Sleep (in general) Good  Pain is worse with: walking, bending, sitting, inactivity, standing and some activites Pain improves with: rest, heat/ice and medication Relief from Meds: 4  Mobility walk without assistance ability to climb steps?  yes do you drive?  yes  Function retired I need assistance with the following:  meal prep, household duties and shopping  Neuro/Psych bladder control problems weakness numbness tremor depression  Prior Studies Any changes since last visit?  no  Physicians involved in your care Any changes since last visit?  no   Family History  Problem Relation Age of Onset  . Breast cancer Mother   . Diabetes Mother   . Heart disease Mother   . Allergies Mother   . Diabetes Father   . Heart disease Father   . Emphysema Father   . Allergies Father   . Stomach cancer Maternal Uncle   . Breast cancer Paternal Aunt   . Colon cancer Neg Hx    Social History   Socioeconomic History  . Marital status: Married    Spouse name: Not on file  . Number  of children: Not on file  . Years of education: Not on file  . Highest education level: Not on file  Occupational History  . Occupation: Radiation protection practitioner  . Occupation: retired  Scientific laboratory technician  . Financial resource strain: Not hard at all  . Food insecurity    Worry: Never true    Inability: Never true  . Transportation needs    Medical: No    Non-medical: No  Tobacco Use  . Smoking status: Never Smoker  . Smokeless tobacco: Never Used  Substance and Sexual Activity  . Alcohol use: Not Currently    Alcohol/week: 0.0 standard drinks    Comment: rare  . Drug use: Yes    Types: Morphine  . Sexual activity: Not Currently  Lifestyle  . Physical activity    Days per week: 0 days    Minutes per session: 0 min  . Stress: Not at all  Relationships  . Social Herbalist on phone: Not on file    Gets together: Not on file    Attends religious service: Not on file    Active member of club or organization: Not on file    Attends meetings of clubs or organizations: Not on file    Relationship status: Not on file  Other Topics Concern  . Not on file  Social History Narrative  . Not on file   Past Surgical History:  Procedure Laterality Date  . [place] STUDY N/A 01/04/2016   Procedure: [place] STUDY;  Surgeon: Manus Gunning, MD;  Location: [facility] ENDOSCOPY;  Service: Gastroenterology;  Laterality: N/A;  . ABDOMINAL HYSTERECTOMY  1988  . ADENOIDECTOMY    . ANTERIOR CERVICAL DECOMP/DISCECTOMY FUSION  2009, 1993   first procedure in ~ 1993/Dr Botero  . APPENDECTOMY  1988  . BACK SURGERY    . CARDIOVERSION N/A 04/03/2019   Procedure: CARDIOVERSION;  Surgeon: Dorothy Spark, MD;  Location: [facility] ENDOSCOPY;  Service: Cardiovascular;  Laterality: N/A;  . CHOLECYSTECTOMY  2005  . ESOPHAGEAL MANOMETRY N/A 01/04/2016   Procedure: ESOPHAGEAL MANOMETRY (EM) 24 HR PH;  Surgeon: Manus Gunning, MD;  Location: [facility] ENDOSCOPY;  Service: Gastroenterology;  Laterality: N/A;  .  ESOPHAGOGASTRODUODENOSCOPY (EGD) WITH PROPOFOL N/A 05/17/2016   Procedure: ESOPHAGOGASTRODUODENOSCOPY (EGD) WITH PROPOFOL;  Surgeon: Manus Gunning, MD;  Location: [facility] ENDOSCOPY;  Service: Gastroenterology;  Laterality: N/A;  . NASAL SINUS SURGERY  2006  . SAVORY DILATION N/A 05/17/2016   Procedure: SAVORY DILATION;  Surgeon: Manus Gunning, MD;  Location: [facility];  Service: Gastroenterology;  Laterality: N/A;  . TEE WITHOUT CARDIOVERSION N/A 04/03/2019   Procedure: TRANSESOPHAGEAL ECHOCARDIOGRAM (TEE)-with DCCV;  Surgeon: Dorothy Spark, MD;  Location: [facility] ENDOSCOPY;; EF 55 to 60%.  No LVH.  Normal RV.  Moderate LA dilation.  No thrombus.  Mild RA dilation.  Mild to moderate TR.  Mild aortic valve sclerosis, no stenosis.  Moderately elevated PA pressures (47 mmHg)  . TONSILLECTOMY AND ADENOIDECTOMY    . TRANSTHORACIC ECHOCARDIOGRAM  2009   Normal LV size and function. EF 60-65%. No regional wall motion and amount is. Mild aortic sclerosis, no stenosis  . TUBAL LIGATION  1979   Past Medical History:  Diagnosis Date  . Allergy   . Anemia   . Arthritis   . B12 deficiency   . Complication of anesthesia    slow to wake up   . Depression   . Diabetes mellitus type 2, diet-controlled ([place]) 07/24/2018  . Fibromyalgia   . Generalized hyperhidrosis 04/17/2014  . GERD (gastroesophageal reflux disease)   . Hyperlipemia   . HYPOTHYROIDISM 05/29/2007  . Irritable bowel syndrome   . Migraines    hx of   . Oxygen deficiency    2 L at bedtime  . PVC (premature ventricular contraction)   . Senile calcific aortic valve sclerosis    Aortic Sclerosis-no stenosis  . Unspecified essential hypertension    denies htn on 05/09/16   There were no vitals taken for this visit.  Opioid Risk Score:   Fall Risk Score:  `1  Depression screen PHQ 2/9  Depression screen [facility] 2/9 01/18/2019 08/08/2018 04/24/2018 01/10/2018 11/24/2017 03/27/2017 02/28/2017  Decreased Interest 0 0 0 2 1 0 1   Down, Depressed, Hopeless 0 0 0 0 1 0 1  PHQ - 2 Score 0 0 0 2 2 0 2  Altered sleeping 0 - - 0 - - -  Tired, decreased energy 3 - - 1 - - -  Change in appetite 0 - - 0 - - -  Feeling bad or failure about yourself  0 - - 0 - - -  Trouble concentrating 0 - - 1 - - -  Moving slowly or fidgety/restless 0 - - 0 - - -  Suicidal thoughts 0 - - 0 - - -  PHQ-9 Score 3 - - 4 - - -  Difficult  doing work/chores Not difficult at all - - Not difficult at all Not difficult at all - -  Some recent data might be hidden     Review of Systems  Constitutional: Negative.   HENT: Negative.   Eyes: Negative.   Respiratory: Negative.   Cardiovascular: Positive for leg swelling.  Gastrointestinal: Negative.   Endocrine: Negative.   Genitourinary: Positive for difficulty urinating.  Musculoskeletal: Positive for arthralgias and back pain.  Skin: Negative.   Allergic/Immunologic: Negative.   Neurological: Positive for tremors, weakness and numbness.  Hematological: Bruises/bleeds easily.  Psychiatric/Behavioral: Positive for dysphoric mood.  All other systems reviewed and are negative.      Objective:   Physical Exam Vitals signs and nursing note reviewed.  Constitutional:      Appearance: Normal appearance.  Neck:     Musculoskeletal: Normal range of motion and neck supple.  Cardiovascular:     Rate and Rhythm: Normal rate and regular rhythm.     Pulses: Normal pulses.     Heart sounds: Normal heart sounds.  Pulmonary:     Effort: Pulmonary effort is normal.     Breath sounds: Normal breath sounds.  Musculoskeletal:     Comments: Normal Muscle Bulk and Muscle Testing Reveals:  Upper Extremities: Full ROM and Muscle Strength 5/5 Thoracic Paraspinal Tenderness: T-7-T-9  Lower Extremities: Full ROM and Muscle Strength 5/5 Arises from Table with ease Narrow Based  Gait   Skin:    General: Skin is warm and dry.  Neurological:     Mental Status: She is oriented to person, place, and time.   Psychiatric:        Mood and Affect: Mood normal.        Behavior: Behavior normal.           Assessment & Plan:  1. Cervical postlaminectomy syndrome:/ Cervicalgia/ Cervical Radiculitis/with neck and shoulder pain/ Chronic Midline Thoracic Pain.05/15/2019.Continue exercise routine and using heat therapy.Refilled:current medication regimen withMS Contin 15 mg one tablet three times a day #90.  We will continue the opioid monitoring program, this consists of regular clinic visits, examinations, urine drug screen, pill counts as well as use of [place] Controlled Substance reporting System. 2. Fibromyalgia/ Neuropathy: Continuecurrent medication regimen withGabapentin, activity and exercise regime.05/15/2019. 3.BilateralKnee Pain/ Mild Degenerative Changes:.Continuecurrent medication regimen withVoltaren Gel.05/15/2019. 4.BilateralAnkle Pain:No complaints today.Continue HEP as tolerated. Continueto monitor.05/15/2019. 5. Muscle Spasm: Continuecurrent medication regimen withRobaxin. 05/15/2019. 6. Opioid Induced Constipation: No complaints Continue to Monitor.04/18/2019. 7. Midline Low Back Pain: Continue current medication regime, continue HEP as tolerated.05/15/2019. 8. Right Gluteal Medius Weakness and Atrophy: No complaints Today:Continue with Hip Abduction Exercisesas tolerated: Continue to Monitor.05/15/2019 9. Right Greater Trochanter Bursitis:No Complaints Today.Continue to Alternate Ice and Heat Therapy. Continue to Monitor. 05/15/2019 10. Sacroiliac Joint Pain:No complaints today.S/PSacroiliac Injection on 08/24/2018 with good relief noted. 05/15/2019 11. Chronic low back pain without sciatica:No complaints today.Continue HEP as tolerated. Continue current medication regimen. Continue to monitor.05/15/2019.  15 minutes of face to face patient care time was spent during this visit. All questions were encouraged and answered.  F/U in 1  month

## 2019-05-18 LAB — TOXASSURE SELECT,+ANTIDEPR,UR

## 2019-05-25 DIAGNOSIS — J9610 Chronic respiratory failure, unspecified whether with hypoxia or hypercapnia: Secondary | ICD-10-CM | POA: Diagnosis not present

## 2019-05-27 ENCOUNTER — Telehealth: Payer: Self-pay | Admitting: *Deleted

## 2019-05-27 NOTE — Telephone Encounter (Signed)
Urine drug screen for this encounter is consistent for prescribed medication 

## 2019-05-28 ENCOUNTER — Other Ambulatory Visit: Payer: Self-pay

## 2019-05-28 ENCOUNTER — Ambulatory Visit: Payer: Medicare Other

## 2019-05-28 DIAGNOSIS — G4733 Obstructive sleep apnea (adult) (pediatric): Secondary | ICD-10-CM

## 2019-05-28 DIAGNOSIS — R0683 Snoring: Secondary | ICD-10-CM

## 2019-05-28 MED ORDER — APIXABAN 5 MG PO TABS: 5.0000 mg | ORAL_TABLET | Freq: Two times a day (BID) | ORAL | 1 refills | Status: DC

## 2019-05-28 MED ORDER — DILTIAZEM HCL ER COATED BEADS 240 MG PO CP24: 240.0000 mg | ORAL_CAPSULE | Freq: Every day | ORAL | 1 refills | Status: DC

## 2019-05-29 ENCOUNTER — Telehealth: Payer: Self-pay | Admitting: Pulmonary Disease

## 2019-05-29 DIAGNOSIS — G4733 Obstructive sleep apnea (adult) (pediatric): Secondary | ICD-10-CM | POA: Diagnosis not present

## 2019-05-29 NOTE — Telephone Encounter (Signed)
HST 05/28/19 >> AHI 26.9, SpO2 low 64%   Please inform her that her sleep study shows moderate obstructive sleep apnea.  Please arrange for ROV with me or NP to discuss treatment options.

## 2019-05-29 NOTE — Telephone Encounter (Signed)
Attempted to reach the patient twice on [phone]. Called patient on home number. Made her aware of the results. Patient voiced understanding. Scheduled for video visit on 06/10/19 at 1:30. Nothing further needed at this time.

## 2019-06-10 ENCOUNTER — Encounter: Payer: Self-pay | Admitting: Pulmonary Disease

## 2019-06-10 ENCOUNTER — Telehealth (INDEPENDENT_AMBULATORY_CARE_PROVIDER_SITE_OTHER): Payer: Medicare Other | Admitting: Pulmonary Disease

## 2019-06-10 ENCOUNTER — Other Ambulatory Visit: Payer: Self-pay | Admitting: Registered Nurse

## 2019-06-10 VITALS — Ht 65.0 in | Wt 224.0 lb

## 2019-06-10 DIAGNOSIS — G4733 Obstructive sleep apnea (adult) (pediatric): Secondary | ICD-10-CM

## 2019-06-10 DIAGNOSIS — G4734 Idiopathic sleep related nonobstructive alveolar hypoventilation: Secondary | ICD-10-CM | POA: Diagnosis not present

## 2019-06-10 NOTE — Progress Notes (Signed)
[facility]  Chief Complaint  Patient presents with  . Follow-up    Review sleep study    Constitutional:  Ht 5\' 5"  (1.651 m)   Wt 224 lb (101.6 kg)   BMI 37.28 kg/m    Past Medical History:  B12 deficiency, Depression, Fibromyalgia, GERD, IBS, HTN, HLD, Hypothyroidism, Migraine HA, DM, A fib  Brief Summary:  [name] is a 68 y.o. 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  Virtual Visit via Video Note  I connected with [name] on 06/10/19 at  1:30 PM EST by a video enabled telemedicine application and verified that I am speaking with the correct person using two identifiers.  Location: Patient: home Provider: medical office   I discussed the limitations of evaluation and management by telemedicine and the availability of in person appointments. The patient expressed understanding and agreed to proceed.   She had home sleep study earlier this month.  Showed moderate OSA.  Still having issues with her sleep, snore, and feeling tired.  Still using 2 liters oxygen at night.   Physical Exam:  Well kept.  Normal speech.  No stridor.  No using accessory muscles.    Assessment/Plan:   Obstructive sleep apnea with history of sleep related hypoxia. - sleep study results reviewed with her - discussed treatment options - discussed how untreated sleep apnea can impact her health - will arrange for in lab CPAP titration study to determine if she just needs CPAP or if she needs Bipap +/- supplemental oxygen - will have her continue 2 liters oxygen at night for now, pending CPAP titration study  Obesity. - discussed importance of weight loss  Patient Instructions  Will arrange for CPAP titration study in sleep lab  Continue using 2 liters oxygen at night with sleep until you hear back regarding your CPAP titration study results  Follow up in 4 months    I  discussed the assessment and treatment plan with the patient. The patient was provided an opportunity to ask questions and all were answered. The patient agreed with the plan and demonstrated an understanding of the instructions.   The patient was advised to call back or seek an in-person evaluation if the symptoms worsen or if the condition fails to improve as anticipated.  I provided 16 minutes of non-face-to-face time during this encounter.   Chesley Mires, MD [facility] Pager: [phone] 06/10/2019, 1:43 PM  Flow Sheet    Sleep tests:  PSG 02/26/15 >> AHI 4.4, SaO2 low 83%. Spent 23.18min with SpO2 <88%. ONO with RA 03/11/15 >> test time 8 hrs 50 min. Baseline SpO2 92%, low SpO2 78%. Spent 24 min with SpO2 < 88%. HST 05/28/19 >> AHI 26.9, SpO2 low 64%  Cardiac tests:  TEE 04/03/19 >> EF 55 to 60%, mod LA dilation, mild MR, mild/mod TR, mild AR  Medications:   Allergies as of 06/10/2019      Reactions   Rizatriptan Benzoate Anaphylaxis   Throat and Tongue Swelling Closed   Sumatriptan Swelling   Throat closed & Tongue Swelling   Tizanidine Shortness Of Breath   Aleve [naproxen Sodium] Hives   Clarithromycin Hives   Cyclobenzaprine Hcl Swelling   Reaction - throat closed, tongue swelling   Doxycycline Diarrhea, Nausea And Vomiting   Duloxetine Nausea Only   Keflex [cephalexin] Itching, Other (See Comments)   Sores   Metronidazole Diarrhea, Nausea Only  Oxycodone Hcl Diarrhea, Nausea And Vomiting   Oxycontin [oxycodone Hcl] Nausea And Vomiting   Penicillins Other (See Comments)   Unknown reaction Did it involve swelling of the face/tongue/throat, SOB, or low BP? Unknown Did it involve sudden or severe rash/hives, skin peeling, or any reaction on the inside of your mouth or nose? Unknown Did you need to seek medical attention at a hospital or doctor's office? Unknown When did it last happen?infant If all above answers are "NO", may proceed  with cephalosporin use.   Rosuvastatin Other (See Comments)   Muscle aches   Venlafaxine Hives   Voltaren [diclofenac Sodium] Hives   Reaction to oral med, uses gel occasionally   Septra [sulfamethoxazole-trimethoprim] Itching, Rash   Tramadol Diarrhea, Nausea And Vomiting, Palpitations      Medication List       Accurate as of June 10, 2019  1:43 PM. If you have any questions, ask your nurse or doctor.        acetaminophen 650 MG CR tablet Commonly known as: TYLENOL Take 650 mg by mouth every 8 (eight) hours as needed (back and neck pain).   apixaban 5 MG Tabs tablet Commonly known as: ELIQUIS Take 1 tablet (5 mg total) by mouth 2 (two) times daily.   B COMPLEX 100 PO Take 1 tablet by mouth every morning.   bisacodyl 5 MG EC tablet Commonly known as: DULCOLAX Take 5-10 mg by mouth at bedtime.   CALCIUM 600+D PO Take 2 tablets by mouth at bedtime.   calcium carbonate 750 MG chewable tablet Commonly known as: TUMS EX Chew 1-2 tablets by mouth 2 (two) times daily as needed for heartburn (acid reflux).   carboxymethylcellulose 0.5 % Soln Commonly known as: REFRESH PLUS Place 1 drop into both eyes 3 (three) times daily as needed (dry eyes).   cetirizine 10 MG tablet Commonly known as: ZYRTEC Take 10 mg by mouth at bedtime.   citalopram 40 MG tablet Commonly known as: CELEXA Take 1 tablet (40 mg total) by mouth daily. What changed: when to take this   Coenzyme Q-10 100 MG capsule Take 100 mg by mouth every morning.   diclofenac sodium 1 % Gel Commonly known as: VOLTAREN Apply 4 g topically 4 (four) times daily as needed (joint pain).   diltiazem 240 MG 24 hr capsule Commonly known as: CARDIZEM CD Take 1 capsule (240 mg total) by mouth daily.   docusate sodium 100 MG capsule Commonly known as: COLACE Take 200 mg by mouth at bedtime.   gabapentin 300 MG capsule Commonly known as: NEURONTIN TAKE 1 CAPSULE BY MOUTH 3  TIMES DAILY   GRAPE SEED EXTRACT  PO Take 1 tablet by mouth every morning.   levothyroxine 50 MCG tablet Commonly known as: SYNTHROID TAKE 1 TABLET BY MOUTH  DAILY What changed: when to take this   loratadine 10 MG tablet Commonly known as: CLARITIN Take 10 mg by mouth every morning.   MAG GLYCINATE PO Take 400 mg by mouth at bedtime. 2 X 200 mg   methocarbamol 500 MG tablet Commonly known as: ROBAXIN TAKE ONE (1) TABLET BY MOUTH EVERY EIGHTHOURS AS NEEDED FOR MUSCLE SPASMS   mirtazapine 15 MG tablet Commonly known as: REMERON Take 1 tablet (15 mg total) by mouth at bedtime.   morphine 15 MG 12 hr tablet Commonly known as: MS Contin Take 1 tablet (15 mg total) by mouth 3 (three) times daily.   nitroGLYCERIN 0.4 MG SL tablet Commonly known as: NITROSTAT  Place 1 tablet (0.4 mg total) under the tongue every 5 (five) minutes as needed for chest pain.   oxybutynin 10 MG 24 hr tablet Commonly known as: DITROPAN-XL Take 1 tablet (10 mg total) by mouth at bedtime.   RABEprazole 20 MG tablet Commonly known as: ACIPHEX Take 1 tablet (20 mg total) by mouth daily. What changed: when to take this   vitamin C 500 MG tablet Commonly known as: ASCORBIC ACID Take 500 mg by mouth 2 (two) times daily.   Vitamin D 125 MCG (5000 UT) Caps Take 5,000 Units by mouth every morning.       Past Surgical History:  She  has a past surgical history that includes Appendectomy (1988); Cholecystectomy (2005); Tubal ligation (1979); Tonsillectomy and adenoidectomy; Anterior cervical decomp/discectomy fusion (2009, 1993); Nasal sinus surgery (2006); Abdominal hysterectomy (1988); Esophageal manometry (N/A, 01/04/2016); 24 hour ph study (N/A, 01/04/2016); transthoracic echocardiogram (2009); Back surgery; Adenoidectomy; Esophagogastroduodenoscopy (egd) with propofol (N/A, 05/17/2016); [place] dilation (N/A, 05/17/2016); TEE without cardioversion (N/A, 04/03/2019); and Cardioversion (N/A, 04/03/2019).  Family History:  Her family history  includes Allergies in her father and mother; Breast cancer in her mother and paternal aunt; Diabetes in her father and mother; Emphysema in her father; Heart disease in her father and mother; Stomach cancer in her maternal uncle.  Social History:  She  reports that she has never smoked. She has never used smokeless tobacco. She reports previous alcohol use. She reports current drug use. Drug: Morphine.

## 2019-06-10 NOTE — Patient Instructions (Signed)
Will arrange for CPAP titration study in sleep lab  Continue using 2 liters oxygen at night with sleep until you hear back regarding your CPAP titration study results  Follow up in 4 months

## 2019-06-14 ENCOUNTER — Telehealth: Payer: Self-pay | Admitting: Registered Nurse

## 2019-06-14 NOTE — Telephone Encounter (Signed)
Placed a call to [name] to F/U on her My-Chart Message. She reports she's only receiving 4 hours of relief of  back pain with her current medication regimen, and  states her pain has increased in intensity. We will increase her MS Contin to 30 mg every 12 hours. She was instructed to take two tablets of her MS Contin  15 mg = 30 mg every 12 hours, to start on 06/15/2019 and call or send a My-Chart message on Monday 06/17/2019 to evaluate medication change. She verbalizes understanding.

## 2019-06-19 ENCOUNTER — Encounter: Payer: Medicare Other | Attending: Physical Medicine & Rehabilitation | Admitting: Registered Nurse

## 2019-06-19 ENCOUNTER — Other Ambulatory Visit: Payer: Self-pay

## 2019-06-19 DIAGNOSIS — G629 Polyneuropathy, unspecified: Secondary | ICD-10-CM

## 2019-06-19 DIAGNOSIS — M5412 Radiculopathy, cervical region: Secondary | ICD-10-CM

## 2019-06-19 DIAGNOSIS — M542 Cervicalgia: Secondary | ICD-10-CM | POA: Diagnosis not present

## 2019-06-19 DIAGNOSIS — G894 Chronic pain syndrome: Secondary | ICD-10-CM

## 2019-06-19 DIAGNOSIS — K5903 Drug induced constipation: Secondary | ICD-10-CM

## 2019-06-19 DIAGNOSIS — M797 Fibromyalgia: Secondary | ICD-10-CM

## 2019-06-19 DIAGNOSIS — M533 Sacrococcygeal disorders, not elsewhere classified: Secondary | ICD-10-CM

## 2019-06-19 DIAGNOSIS — M25562 Pain in left knee: Secondary | ICD-10-CM

## 2019-06-19 DIAGNOSIS — M5416 Radiculopathy, lumbar region: Secondary | ICD-10-CM

## 2019-06-19 DIAGNOSIS — M25561 Pain in right knee: Secondary | ICD-10-CM

## 2019-06-19 DIAGNOSIS — M961 Postlaminectomy syndrome, not elsewhere classified: Secondary | ICD-10-CM | POA: Diagnosis not present

## 2019-06-19 DIAGNOSIS — T402X5S Adverse effect of other opioids, sequela: Secondary | ICD-10-CM

## 2019-06-19 DIAGNOSIS — M25572 Pain in left ankle and joints of left foot: Secondary | ICD-10-CM

## 2019-06-19 DIAGNOSIS — Z5181 Encounter for therapeutic drug level monitoring: Secondary | ICD-10-CM | POA: Insufficient documentation

## 2019-06-19 DIAGNOSIS — Z79891 Long term (current) use of opiate analgesic: Secondary | ICD-10-CM | POA: Insufficient documentation

## 2019-06-19 DIAGNOSIS — Z79899 Other long term (current) drug therapy: Secondary | ICD-10-CM | POA: Insufficient documentation

## 2019-06-19 DIAGNOSIS — M25571 Pain in right ankle and joints of right foot: Secondary | ICD-10-CM

## 2019-06-19 DIAGNOSIS — M546 Pain in thoracic spine: Secondary | ICD-10-CM | POA: Diagnosis not present

## 2019-06-19 DIAGNOSIS — M62838 Other muscle spasm: Secondary | ICD-10-CM

## 2019-06-19 DIAGNOSIS — G8929 Other chronic pain: Secondary | ICD-10-CM

## 2019-06-19 DIAGNOSIS — M545 Low back pain: Secondary | ICD-10-CM

## 2019-06-19 DIAGNOSIS — M7061 Trochanteric bursitis, right hip: Secondary | ICD-10-CM

## 2019-06-19 MED ORDER — MORPHINE SULFATE ER 15 MG PO TBCR: 15.0000 mg | EXTENDED_RELEASE_TABLET | Freq: Three times a day (TID) | ORAL | 0 refills | Status: DC

## 2019-06-19 NOTE — Progress Notes (Signed)
Subjective:    Patient ID: [name], female    DOB: 22-Jan-1951, 68 y.o.   MRN: [id number]  HPI: [name] is a 68 y.o. female her appointment was changed to a virtual office visit to reduce the risk of exposure to the COVID-19 virus and to help [name] remain healthy and safe. The virtual visit will also provide continuity of care. She agrees with virtual visit and verbalizes understanding. She states her pain is located in her mid- lower back radiating into her buttocks and bilateral knee pain. She rates her pain 5. Her current exercise regime is walking and performing stretching exercises.  Also reports she was diagnosed with moderate -severe OSA, we discussed different treatment modalities. She is allergic to Oxycodone, at this time we will continue with her current medication regimen.   [name] Morphine equivalent is 45.00  MME.  Last UDS was Performed on 05/15/2019, it was consistent.   Wenda Overland RN asked the Health and History Questions, this provider and Wenda Overland verified we were speaking with the correct person using two identifiers.   Pain Inventory Average Pain 5 Pain Right Now 5 My pain is constant, burning and aching  In the last 24 hours, has pain interfered with the following? General activity 6 Relation with others 6 Enjoyment of life 6 What TIME of day is your pain at its worst? evening Sleep (in general) Good  Pain is worse with: walking, bending, standing and some activites Pain improves with: rest, heat/ice, medication and hot bath Relief from Meds: 3  Mobility walk without assistance ability to climb steps?  yes do you drive?  yes  Function retired I need assistance with the following:  meal prep, household duties and shopping  Neuro/Psych bladder control problems weakness numbness tremor depression  Prior Studies Any changes since last visit?  no  Physicians involved in your care Any changes since last visit?   no   Family History  Problem Relation Age of Onset  . Breast cancer Mother   . Diabetes Mother   . Heart disease Mother   . Allergies Mother   . Diabetes Father   . Heart disease Father   . Emphysema Father   . Allergies Father   . Stomach cancer Maternal Uncle   . Breast cancer Paternal Aunt   . Colon cancer Neg Hx    Social History   Socioeconomic History  . Marital status: Married    Spouse name: Not on file  . Number of children: Not on file  . Years of education: Not on file  . Highest education level: Not on file  Occupational History  . Occupation: Radiation protection practitioner  . Occupation: retired  Tobacco Use  . Smoking status: Never Smoker  . Smokeless tobacco: Never Used  Substance and Sexual Activity  . Alcohol use: Not Currently    Alcohol/week: 0.0 standard drinks    Comment: rare  . Drug use: Yes    Types: Morphine  . Sexual activity: Not Currently  Other Topics Concern  . Not on file  Social History Narrative  . Not on file   Social Determinants of Health   Financial Resource Strain: Low Risk   . Difficulty of Paying Living Expenses: Not hard at all  Food Insecurity: No Food Insecurity  . Worried About Charity fundraiser in the Last Year: Never true  . Ran Out of Food in the Last Year: Never true  Transportation Needs: No Transportation Needs  .  Lack of Transportation (Medical): No  . Lack of Transportation (Non-Medical): No  Physical Activity: Inactive  . Days of Exercise per Week: 0 days  . Minutes of Exercise per Session: 0 min  Stress: No Stress Concern Present  . Feeling of Stress : Not at all  Social Connections:   . Frequency of Communication with Friends and Family: Not on file  . Frequency of Social Gatherings with Friends and Family: Not on file  . Attends Religious Services: Not on file  . Active Member of Clubs or Organizations: Not on file  . Attends Archivist Meetings: Not on file  . Marital Status: Not on file   Past Surgical  History:  Procedure Laterality Date  . [place] STUDY N/A 01/04/2016   Procedure: [place] STUDY;  Surgeon: Manus Gunning, MD;  Location: [facility] ENDOSCOPY;  Service: Gastroenterology;  Laterality: N/A;  . ABDOMINAL HYSTERECTOMY  1988  . ADENOIDECTOMY    . ANTERIOR CERVICAL DECOMP/DISCECTOMY FUSION  2009, 1993   first procedure in ~ 1993/Dr Botero  . APPENDECTOMY  1988  . BACK SURGERY    . CARDIOVERSION N/A 04/03/2019   Procedure: CARDIOVERSION;  Surgeon: Dorothy Spark, MD;  Location: [facility] ENDOSCOPY;  Service: Cardiovascular;  Laterality: N/A;  . CHOLECYSTECTOMY  2005  . ESOPHAGEAL MANOMETRY N/A 01/04/2016   Procedure: ESOPHAGEAL MANOMETRY (EM) 24 HR PH;  Surgeon: Manus Gunning, MD;  Location: [facility] ENDOSCOPY;  Service: Gastroenterology;  Laterality: N/A;  . ESOPHAGOGASTRODUODENOSCOPY (EGD) WITH PROPOFOL N/A 05/17/2016   Procedure: ESOPHAGOGASTRODUODENOSCOPY (EGD) WITH PROPOFOL;  Surgeon: Manus Gunning, MD;  Location: [facility] ENDOSCOPY;  Service: Gastroenterology;  Laterality: N/A;  . NASAL SINUS SURGERY  2006  . SAVORY DILATION N/A 05/17/2016   Procedure: SAVORY DILATION;  Surgeon: Manus Gunning, MD;  Location: [facility] ENDOSCOPY;  Service: Gastroenterology;  Laterality: N/A;  . TEE WITHOUT CARDIOVERSION N/A 04/03/2019   Procedure: TRANSESOPHAGEAL ECHOCARDIOGRAM (TEE)-with DCCV;  Surgeon: Dorothy Spark, MD;  Location: [facility] ENDOSCOPY;; EF 55 to 60%.  No LVH.  Normal RV.  Moderate LA dilation.  No thrombus.  Mild RA dilation.  Mild to moderate TR.  Mild aortic valve sclerosis, no stenosis.  Moderately elevated PA pressures (47 mmHg)  . TONSILLECTOMY AND ADENOIDECTOMY    . TRANSTHORACIC ECHOCARDIOGRAM  2009   Normal LV size and function. EF 60-65%. No regional wall motion and amount is. Mild aortic sclerosis, no stenosis  . TUBAL LIGATION  1979   Past Medical History:  Diagnosis Date  . Allergy   . Anemia   . Arthritis   . B12 deficiency   . Complication of  anesthesia    slow to wake up   . Depression   . Diabetes mellitus type 2, diet-controlled ([place]) 07/24/2018  . Fibromyalgia   . Generalized hyperhidrosis 04/17/2014  . GERD (gastroesophageal reflux disease)   . Hyperlipemia   . HYPOTHYROIDISM 05/29/2007  . Irritable bowel syndrome   . Migraines    hx of   . Oxygen deficiency    2 L at bedtime  . PVC (premature ventricular contraction)   . Senile calcific aortic valve sclerosis    Aortic Sclerosis-no stenosis  . Unspecified essential hypertension    denies htn on 05/09/16   There were no vitals taken for this visit.  Opioid Risk Score:   Fall Risk Score:  `1  Depression screen [facility] 2/9  Depression screen [facility] 2/9 06/19/2019 01/18/2019 08/08/2018 04/24/2018 01/10/2018 11/24/2017 03/27/2017  Decreased Interest 1  0 0 0 2 1 0  Down, Depressed, Hopeless 1 0 0 0 0 1 0  PHQ - 2 Score 2 0 0 0 2 2 0  Altered sleeping - 0 - - 0 - -  Tired, decreased energy - 3 - - 1 - -  Change in appetite - 0 - - 0 - -  Feeling bad or failure about yourself  - 0 - - 0 - -  Trouble concentrating - 0 - - 1 - -  Moving slowly or fidgety/restless - 0 - - 0 - -  Suicidal thoughts - 0 - - 0 - -  PHQ-9 Score - 3 - - 4 - -  Difficult doing work/chores - Not difficult at all - - Not difficult at all Not difficult at all -  Some recent data might be hidden    Review of Systems  Constitutional: Negative.   HENT: Negative.   Eyes: Negative.   Respiratory: Positive for apnea.        Being evaluated for sleep apnea  Cardiovascular: Negative.   Gastrointestinal: Negative.   Endocrine: Negative.   Genitourinary:       Bladder control  Musculoskeletal: Positive for back pain and myalgias.       Knee pain bilateral  Skin: Negative.   Allergic/Immunologic: Negative.   Neurological: Positive for weakness and numbness.  Hematological: Bruises/bleeds easily.       Taking eliquis  Psychiatric/Behavioral: Positive for dysphoric mood.  All other systems reviewed  and are negative.      Objective:   Physical Exam Vitals and nursing note reviewed.  Musculoskeletal:     Comments: No Physical Exam Performed: Virtual Visit           Assessment & Plan:  1. Cervical postlaminectomy syndrome:/ Cervicalgia/ Cervical Radiculitis/with neck and shoulder pain/ Chronic Midline Thoracic Pain.06/19/2019.Continue exercise routine and using heat therapy.Refilled:current medication regimen withMS Contin 15 mg one tablet three times a day #90.  We will continue the opioid monitoring program, this consists of regular clinic visits, examinations, urine drug screen, pill counts as well as use of [place] Controlled Substance reporting System. 2. Fibromyalgia/ Neuropathy: Continuecurrent medication regimen withGabapentin, activity and exercise regime.06/19/2019. 3.BilateralKnee Pain/ Mild Degenerative Changes:.Continuecurrent medication regimen withVoltaren Gel.06/19/2019. 4.BilateralAnkle Pain:No complaints today.Continue HEP as tolerated. Continueto monitor.06/19/2019. 5. Muscle Spasm: Continuecurrent medication regimen withRobaxin.06/19/2019. 6. Opioid Induced Constipation: No complaints Continue to Monitor.06/19/2019. 7. Midline Low Back Pain: Continue current medication regime, continue HEP as tolerated.06/19/2019. 8. Right Gluteal Medius Weakness and Atrophy: No complaints Today:Continue with Hip Abduction Exercisesas tolerated: Continue to Monitor.06/19/2019 9. Right Greater Trochanter Bursitis:No Complaints Today.Continue to Alternate Ice and Heat Therapy. Continue to Monitor.06/19/2019 10. Sacroiliac Joint Pain:No complaints today.S/PSacroiliac Injection on 08/24/2018 with good relief noted.05/15/2019 11. Lumbar Radiculitis: Continue Gabapentin. Continue HEP as tolerated. Continue current medication regimen. Continue to monitor.06/19/2019.  F/U in 1 month  Virtual Visit Location of patient:In her  Home Location of provider: Office Established patient Time spent: 10 minutes

## 2019-06-22 ENCOUNTER — Other Ambulatory Visit (HOSPITAL_COMMUNITY): Payer: Medicare Other

## 2019-06-23 ENCOUNTER — Other Ambulatory Visit: Payer: Self-pay | Admitting: Registered Nurse

## 2019-06-23 ENCOUNTER — Encounter: Payer: Self-pay | Admitting: Registered Nurse

## 2019-06-24 ENCOUNTER — Other Ambulatory Visit (HOSPITAL_COMMUNITY)
Admission: RE | Admit: 2019-06-24 | Discharge: 2019-06-24 | Disposition: A | Discharge status: Home / Self Care | Payer: Medicare Other | Source: Ambulatory Visit | Attending: Pulmonary Disease | Admitting: Pulmonary Disease

## 2019-06-24 ENCOUNTER — Encounter (HOSPITAL_BASED_OUTPATIENT_CLINIC_OR_DEPARTMENT_OTHER): Payer: Medicare Other | Admitting: Pulmonary Disease

## 2019-06-24 DIAGNOSIS — J9610 Chronic respiratory failure, unspecified whether with hypoxia or hypercapnia: Secondary | ICD-10-CM | POA: Diagnosis not present

## 2019-06-24 DIAGNOSIS — Z20828 Contact with and (suspected) exposure to other viral communicable diseases: Secondary | ICD-10-CM | POA: Insufficient documentation

## 2019-06-24 DIAGNOSIS — Z01812 Encounter for preprocedural laboratory examination: Secondary | ICD-10-CM | POA: Diagnosis not present

## 2019-06-25 LAB — SARS CORONAVIRUS 2 (TAT 6-24 HRS): SARS Coronavirus 2: NEGATIVE

## 2019-06-26 ENCOUNTER — Ambulatory Visit (HOSPITAL_BASED_OUTPATIENT_CLINIC_OR_DEPARTMENT_OTHER): Payer: Medicare Other | Attending: Pulmonary Disease | Admitting: Pulmonary Disease

## 2019-06-26 ENCOUNTER — Other Ambulatory Visit: Payer: Self-pay

## 2019-06-26 ENCOUNTER — Encounter (HOSPITAL_BASED_OUTPATIENT_CLINIC_OR_DEPARTMENT_OTHER): Payer: Self-pay | Admitting: Pulmonary Disease

## 2019-06-26 VITALS — Temp 97.4°F | Ht 65.0 in | Wt 220.0 lb

## 2019-06-26 DIAGNOSIS — G4739 Other sleep apnea: Secondary | ICD-10-CM

## 2019-06-26 DIAGNOSIS — Z7901 Long term (current) use of anticoagulants: Secondary | ICD-10-CM | POA: Diagnosis not present

## 2019-06-26 DIAGNOSIS — G4733 Obstructive sleep apnea (adult) (pediatric): Secondary | ICD-10-CM

## 2019-06-26 DIAGNOSIS — G4731 Primary central sleep apnea: Secondary | ICD-10-CM | POA: Insufficient documentation

## 2019-06-26 DIAGNOSIS — Z79899 Other long term (current) drug therapy: Secondary | ICD-10-CM | POA: Diagnosis not present

## 2019-06-26 DIAGNOSIS — T8189XA Other complications of procedures, not elsewhere classified, initial encounter: Secondary | ICD-10-CM

## 2019-06-26 HISTORY — DX: Obstructive sleep apnea (adult) (pediatric): G47.33

## 2019-07-01 ENCOUNTER — Other Ambulatory Visit (HOSPITAL_BASED_OUTPATIENT_CLINIC_OR_DEPARTMENT_OTHER): Payer: Self-pay

## 2019-07-01 ENCOUNTER — Telehealth: Payer: Self-pay | Admitting: Pulmonary Disease

## 2019-07-01 DIAGNOSIS — G4733 Obstructive sleep apnea (adult) (pediatric): Secondary | ICD-10-CM

## 2019-07-01 DIAGNOSIS — G4734 Idiopathic sleep related nonobstructive alveolar hypoventilation: Secondary | ICD-10-CM

## 2019-07-01 DIAGNOSIS — J9611 Chronic respiratory failure with hypoxia: Secondary | ICD-10-CM

## 2019-07-01 NOTE — Telephone Encounter (Signed)
Bipap 06/26/19 >> Bipap 17/13 cm H2O.  Treatment emergent centrals.   Please let her know she did best during the titration study with Bipap 17/13 cm H2O.  She didn't need supplemental oxygen once she was on appropriate Bipap settings.  Please send order to arrange for Bipap 17/13 cm H2O with heated humidity and mask of choice.  Please use diagnosis codes for obstructive sleep apnea and treatment emergent central sleep apnea.  Please schedule ROV in 2 months after starting Bipap.  She can also have nocturnal oxygen set up discontinued.

## 2019-07-01 NOTE — Procedures (Signed)
    Patient Name: [name], Cro[place] Date: 06/26/2019 Gender: Female D.O.B: March 11, 1951 Age (years): 3 Referring Provider: Chesley Mires MD, ABSM Height (inches): 65 Interpreting Physician: Chesley Mires MD, ABSM Weight (lbs): 220 RPSGT: Carolin Coy BMI: 37 MRN: [id number] Neck Size: 15.50  CLINICAL INFORMATION The patient is referred for a BiPAP titration to treat sleep apnea.  Date of HST: 05/28/19, AHI 26.9 and SpO2 low 64%.  SLEEP STUDY TECHNIQUE As per the AASM Manual for the Scoring of Sleep and Associated Events v2.3 (April 2016) with a hypopnea requiring 4% desaturations.  The channels recorded and monitored were frontal, central and occipital EEG, electrooculogram (EOG), submentalis EMG (chin), nasal and oral airflow, thoracic and abdominal wall motion, anterior tibialis EMG, snore microphone, electrocardiogram, and pulse oximetry. Bilevel positive airway pressure (BPAP) was initiated at the beginning of the study and titrated to treat sleep-disordered breathing.  MEDICATIONS Medications self-administered by patient taken the night of the study : ACIPHEX, CALICUM CARBONATE-VITAMIN D, COLACE, DULCOLAX, ELIQUIS, GABAPENTIN, GRAPE SEED, MAGNESIUM BISGLYCINATE, MORPHINE, OXYBUTYNIN, REMERON, ROBAXIN, ZYRTEC  RESPIRATORY PARAMETERS Optimal IPAP Pressure (cm): 17 AHI at Optimal Pressure (/hr) 15.0 Optimal EPAP Pressure (cm): 13   Overall Minimal O2 (%): 86.0 Minimal O2 at Optimal Pressure (%): 89.0  She was started on CPAP and increased to 13 cm H2O.  She developed central events associated with oxygen desaturation.  She transitioned to Bipap.  Appeared that Bipap 17/13 cm was optimal setting.    SLEEP ARCHITECTURE Start Time: 10:44:04 PM Stop Time: 4:57:48 AM Total Time (min): 373.7 Total Sleep Time (min): 277.5 Sleep Latency (min): 60.4 Sleep Efficiency (%): 74.2% REM Latency (min): N/A WASO (min): 35.8 Stage N1 (%): 8.8% Stage N2 (%): 91.2% Stage N3 (%): 0.0% Stage R  (%): 0 Supine (%): 100.00 Arousal Index (/hr): 21.2   CARDIAC DATA The 2 lead EKG demonstrated sinus rhythm. The mean heart rate was 65.7 beats per minute. Other EKG findings include: PVCs.  LEG MOVEMENT DATA The total Periodic Limb Movements of Sleep (PLMS) were 0. The PLMS index was 0.0. A PLMS index of <15 is considered normal in adults.  IMPRESSIONS - She was tried on CPAP, but continued to have respiratory events with treatment emergent central apnea.  She was not adequately controlled with CPAP. - It appeared that Bipap 17/13 cm H2O was optimal setting during this study. - She did not require the use of supplemental oxygen during this study.  DIAGNOSIS - Obstructive Sleep Apnea  - Treatment Emergent Central Sleep Apnea  RECOMMENDATIONS - Trial of BiPAP therapy on 17/13 cm H2O with a Standard size Resmed Nasal Mask AirFit N30I mask and heated humidification.  [Electronically signed] 07/01/2019 08:53 AM  Chesley Mires MD, ABSM Diplomate, American Board of Sleep Medicine   NPI: [id number]

## 2019-07-01 NOTE — Telephone Encounter (Signed)
Tried to call the main number highlighted three times, but the call was hung up on 3 times without anyone responding. Had to LVMTCB x 1 for patient on her cell # [phone].

## 2019-07-02 LAB — HM DIABETES EYE EXAM

## 2019-07-03 ENCOUNTER — Other Ambulatory Visit: Payer: Self-pay | Admitting: Family Medicine

## 2019-07-03 NOTE — Telephone Encounter (Signed)
I called the patient and made her aware of the results. She voiced understanding and confirmed that [place] providers her oxygen used at night. I told her the order for the BIPAP will be sent to them since the O2 has to be discontinued also.  Dr. Halford Chessman with max IPAP of 17 and min EPAP of 13, what should the pressure support setting be?

## 2019-07-03 NOTE — Telephone Encounter (Signed)
[id number] or [phone] pt having phone issues please call back

## 2019-07-03 NOTE — Telephone Encounter (Signed)
This is a fixed pressure setting for Bipap and not auto [place] setting.  Therefore there is no pressure support setting, since the pressure support is fixed at 4 cm H2O (difference between 17 and 13 cm H2O).

## 2019-07-03 NOTE — Telephone Encounter (Signed)
Order placed. Nothing further needed at this time. 

## 2019-07-09 ENCOUNTER — Encounter: Payer: Self-pay | Admitting: Family Medicine

## 2019-07-12 DIAGNOSIS — J9610 Chronic respiratory failure, unspecified whether with hypoxia or hypercapnia: Secondary | ICD-10-CM | POA: Diagnosis not present

## 2019-07-12 DIAGNOSIS — G4733 Obstructive sleep apnea (adult) (pediatric): Secondary | ICD-10-CM | POA: Diagnosis not present

## 2019-07-18 ENCOUNTER — Encounter: Payer: Self-pay | Admitting: Registered Nurse

## 2019-07-18 ENCOUNTER — Other Ambulatory Visit: Payer: Self-pay

## 2019-07-18 ENCOUNTER — Encounter: Payer: Medicare Other | Attending: Physical Medicine & Rehabilitation | Admitting: Registered Nurse

## 2019-07-18 VITALS — HR 64 | Ht 65.0 in | Wt 220.0 lb

## 2019-07-18 DIAGNOSIS — G894 Chronic pain syndrome: Secondary | ICD-10-CM | POA: Insufficient documentation

## 2019-07-18 DIAGNOSIS — Z5181 Encounter for therapeutic drug level monitoring: Secondary | ICD-10-CM

## 2019-07-18 DIAGNOSIS — Z79891 Long term (current) use of opiate analgesic: Secondary | ICD-10-CM | POA: Diagnosis not present

## 2019-07-18 DIAGNOSIS — M25562 Pain in left knee: Secondary | ICD-10-CM

## 2019-07-18 DIAGNOSIS — M546 Pain in thoracic spine: Secondary | ICD-10-CM

## 2019-07-18 DIAGNOSIS — Z79899 Other long term (current) drug therapy: Secondary | ICD-10-CM | POA: Insufficient documentation

## 2019-07-18 DIAGNOSIS — M961 Postlaminectomy syndrome, not elsewhere classified: Secondary | ICD-10-CM | POA: Diagnosis not present

## 2019-07-18 DIAGNOSIS — G8929 Other chronic pain: Secondary | ICD-10-CM

## 2019-07-18 MED ORDER — MORPHINE SULFATE ER 15 MG PO TBCR: 15.0000 mg | EXTENDED_RELEASE_TABLET | Freq: Three times a day (TID) | ORAL | 0 refills | Status: DC

## 2019-07-18 NOTE — Progress Notes (Signed)
Subjective:    Patient ID: [name], female    DOB: [birth year], 69 y.o.   MRN: [id number]  HPI: [name] is a 69 y.o. female whose appointment was changed to a virtual office visit to reduce the risk of exposure to the COVID-19 virus and to help [name] remain healthy and safe. The virtual visit will also provide continuity of care. [name] agrees with virtual visit and verbalizes understanding.  She states her pain is located in her mid- back and left knee. She rates her  Pain 8. Her current exercise regime is walking and performing stretching exercises.  [name] Morphine equivalent is 45.00  MME.  Last UDS was Performed on 05/15/2019, it was consistent.   [name] asked he Health and History Questions. This provider and [name] verified we were speaking with the correct person, using two identifiers.    Pain Inventory Average Pain 5 Pain Right Now 8 My pain is constant, dull and aching  In the last 24 hours, has pain interfered with the following? General activity 5 Relation with others 5 Enjoyment of life 5 What TIME of day is your pain at its worst? evening Sleep (in general) Good  Pain is worse with: walking, bending, standing and some activites Pain improves with: rest, heat/ice, therapy/exercise, medication and injections Relief from Meds: 6  Mobility walk without assistance walk with assistance ability to climb steps?  yes do you drive?  no  Function retired  Neuro/Psych bladder control problems weakness numbness tremor tingling  Prior Studies Any changes since last visit?  no  Physicians involved in your care Any changes since last visit?  no   Family History  Problem Relation Age of Onset  . Breast cancer Mother   . Diabetes Mother   . Heart disease Mother   . Allergies Mother   . Diabetes Father   . Heart disease Father   . Emphysema Father   . Allergies Father   . Stomach cancer Maternal Uncle   . Breast cancer  Paternal Aunt   . Colon cancer Neg Hx    Social History   Socioeconomic History  . Marital status: Married    Spouse name: Not on file  . Number of children: Not on file  . Years of education: Not on file  . Highest education level: Not on file  Occupational History  . Occupation: Radiation protection practitioner  . Occupation: retired  Tobacco Use  . Smoking status: Never Smoker  . Smokeless tobacco: Never Used  Substance and Sexual Activity  . Alcohol use: Not Currently    Alcohol/week: 0.0 standard drinks    Comment: rare  . Drug use: Yes    Types: Morphine  . Sexual activity: Not Currently  Other Topics Concern  . Not on file  Social History Narrative  . Not on file   Social Determinants of Health   Financial Resource Strain: Low Risk   . Difficulty of Paying Living Expenses: Not hard at all  Food Insecurity: No Food Insecurity  . Worried About Charity fundraiser in the Last Year: Never true  . Ran Out of Food in the Last Year: Never true  Transportation Needs: No Transportation Needs  . Lack of Transportation (Medical): No  . Lack of Transportation (Non-Medical): No  Physical Activity: Inactive  . Days of Exercise per Week: 0 days  . Minutes of Exercise per Session: 0 min  Stress: No Stress Concern Present  . Feeling of  Stress : Not at all  Social Connections:   . Frequency of Communication with Friends and Family: Not on file  . Frequency of Social Gatherings with Friends and Family: Not on file  . Attends Religious Services: Not on file  . Active Member of Clubs or Organizations: Not on file  . Attends Archivist Meetings: Not on file  . Marital Status: Not on file   Past Surgical History:  Procedure Laterality Date  . [place] STUDY N/A 01/04/2016   Procedure: [place] STUDY;  Surgeon: Manus Gunning, MD;  Location: [facility] ENDOSCOPY;  Service: Gastroenterology;  Laterality: N/A;  . ABDOMINAL HYSTERECTOMY  1988  . ADENOIDECTOMY    . ANTERIOR CERVICAL  DECOMP/DISCECTOMY FUSION  2009, 1993   first procedure in ~ 1993/Dr Botero  . APPENDECTOMY  1988  . BACK SURGERY    . CARDIOVERSION N/A 04/03/2019   Procedure: CARDIOVERSION;  Surgeon: Dorothy Spark, MD;  Location: [facility] ENDOSCOPY;  Service: Cardiovascular;  Laterality: N/A;  . CHOLECYSTECTOMY  2005  . ESOPHAGEAL MANOMETRY N/A 01/04/2016   Procedure: ESOPHAGEAL MANOMETRY (EM) 24 HR PH;  Surgeon: Manus Gunning, MD;  Location: [facility] ENDOSCOPY;  Service: Gastroenterology;  Laterality: N/A;  . ESOPHAGOGASTRODUODENOSCOPY (EGD) WITH PROPOFOL N/A 05/17/2016   Procedure: ESOPHAGOGASTRODUODENOSCOPY (EGD) WITH PROPOFOL;  Surgeon: Manus Gunning, MD;  Location: [facility] ENDOSCOPY;  Service: Gastroenterology;  Laterality: N/A;  . NASAL SINUS SURGERY  2006  . SAVORY DILATION N/A 05/17/2016   Procedure: SAVORY DILATION;  Surgeon: Manus Gunning, MD;  Location: [facility] ENDOSCOPY;  Service: Gastroenterology;  Laterality: N/A;  . TEE WITHOUT CARDIOVERSION N/A 04/03/2019   Procedure: TRANSESOPHAGEAL ECHOCARDIOGRAM (TEE)-with DCCV;  Surgeon: Dorothy Spark, MD;  Location: [facility] ENDOSCOPY;; EF 55 to 60%.  No LVH.  Normal RV.  Moderate LA dilation.  No thrombus.  Mild RA dilation.  Mild to moderate TR.  Mild aortic valve sclerosis, no stenosis.  Moderately elevated PA pressures (47 mmHg)  . TONSILLECTOMY AND ADENOIDECTOMY    . TRANSTHORACIC ECHOCARDIOGRAM  2009   Normal LV size and function. EF 60-65%. No regional wall motion and amount is. Mild aortic sclerosis, no stenosis  . TUBAL LIGATION  1979   Past Medical History:  Diagnosis Date  . Allergy   . Anemia   . Arthritis   . B12 deficiency   . Complication of anesthesia    slow to wake up   . Depression   . Diabetes mellitus type 2, diet-controlled ([place]) 07/24/2018  . Fibromyalgia   . Generalized hyperhidrosis 04/17/2014  . GERD (gastroesophageal reflux disease)   . Hyperlipemia   . HYPOTHYROIDISM 05/29/2007  . Irritable bowel syndrome   .  Migraines    hx of   . OSA (obstructive sleep apnea) 06/26/2019  . Oxygen deficiency    2 L at bedtime  . PVC (premature ventricular contraction)   . Senile calcific aortic valve sclerosis    Aortic Sclerosis-no stenosis  . Unspecified essential hypertension    denies htn on 05/09/16   There were no vitals taken for this visit.  Opioid Risk Score:   Fall Risk Score:  `1  Depression screen [facility] 2/9  Depression screen [facility] 2/9 06/19/2019 01/18/2019 08/08/2018 04/24/2018 01/10/2018 11/24/2017 03/27/2017  Decreased Interest 1 0 0 0 2 1 0  Down, Depressed, Hopeless 1 0 0 0 0 1 0  PHQ - 2 Score 2 0 0 0 2 2 0  Altered sleeping - 0 - - 0 - -  Tired, decreased energy - 3 - - 1 - -  Change in appetite - 0 - - 0 - -  Feeling bad or failure about yourself  - 0 - - 0 - -  Trouble concentrating - 0 - - 1 - -  Moving slowly or fidgety/restless - 0 - - 0 - -  Suicidal thoughts - 0 - - 0 - -  PHQ-9 Score - 3 - - 4 - -  Difficult doing work/chores - Not difficult at all - - Not difficult at all Not difficult at all -  Some recent data might be hidden     Review of Systems  Constitutional: Negative.   HENT: Negative.   Eyes: Negative.   Respiratory: Negative.   Cardiovascular: Negative.   Gastrointestinal: Negative.   Endocrine: Negative.   Genitourinary: Positive for difficulty urinating.  Musculoskeletal: Negative.   Skin: Negative.   Allergic/Immunologic: Negative.   Neurological: Positive for tremors, weakness and numbness.  Hematological: Negative.   Psychiatric/Behavioral: Negative.   All other systems reviewed and are negative.      Objective:   Physical Exam Vitals and nursing note reviewed.  Musculoskeletal:     Comments: No Physical Exam: Virtual visit           Assessment & Plan:  1. Cervical postlaminectomy syndrome:/ Cervicalgia/ Cervical Radiculitis/with neck and shoulder pain/ Chronic Midline Thoracic Pain.07/18/2019.Continue exercise routine and using heat  therapy.Refilled:current medication regimen withMS Contin 15 mg one tablet three times a day #90.  We will continue the opioid monitoring program, this consists of regular clinic visits, examinations, urine drug screen, pill counts as well as use of [place] Controlled Substance reporting System. 2. Fibromyalgia/ Neuropathy: Continuecurrent medication regimen withGabapentin, activity and exercise regime.07/18/2019. 3.Left Knee Pain/BilateralKnee Pain/ Mild Degenerative Changes:.Continuecurrent medication regimen withVoltaren Gel.07/18/2019. 4.BilateralAnkle Pain:No complaints today.Continue HEP as tolerated. Continueto monitor.07/18/2019. 5. Muscle Spasm: Continuecurrent medication regimen withRobaxin.07/18/2019.. 6. Opioid Induced Constipation: No complaints Continue to Monitor.07/18/2019. 7. Midline Low Back Pain: Continue current medication regime, continue HEP as tolerated.07/18/2019. 8. Right Gluteal Medius Weakness and Atrophy: No complaints Today:Continue with Hip Abduction Exercisesas tolerated: Continue to Monitor.07/18/2019. 9. Right Greater Trochanter Bursitis:No Complaints Today.Continue to Alternate Ice and Heat Therapy. Continue to Monitor.07/18/2019 10. Sacroiliac Joint Pain:No complaints today.S/PSacroiliac Injection on 08/24/2018 with good relief noted.07/18/2019 11. Lumbar Radiculitis: Continue Gabapentin. Continue HEP as tolerated. Continue current medication regimen. Continue to monitor.07/18/2019.  F/U in 1 month  Virtual Visit Location of patient:In her Home Location of provider: Office Established patient Time spent:10 minutes

## 2019-07-29 ENCOUNTER — Other Ambulatory Visit: Payer: Self-pay | Admitting: Family Medicine

## 2019-07-29 DIAGNOSIS — Z1231 Encounter for screening mammogram for malignant neoplasm of breast: Secondary | ICD-10-CM

## 2019-07-31 ENCOUNTER — Encounter: Payer: Self-pay | Admitting: Family Medicine

## 2019-07-31 ENCOUNTER — Ambulatory Visit (INDEPENDENT_AMBULATORY_CARE_PROVIDER_SITE_OTHER): Payer: Medicare Other | Admitting: Family Medicine

## 2019-07-31 ENCOUNTER — Other Ambulatory Visit: Payer: Self-pay

## 2019-07-31 VITALS — BP 120/60 | HR 63 | Temp 97.9°F | Ht 65.0 in | Wt 228.2 lb

## 2019-07-31 DIAGNOSIS — Z79899 Other long term (current) drug therapy: Secondary | ICD-10-CM

## 2019-07-31 DIAGNOSIS — E038 Other specified hypothyroidism: Secondary | ICD-10-CM

## 2019-07-31 DIAGNOSIS — I10 Essential (primary) hypertension: Secondary | ICD-10-CM | POA: Diagnosis not present

## 2019-07-31 DIAGNOSIS — F3341 Major depressive disorder, recurrent, in partial remission: Secondary | ICD-10-CM | POA: Diagnosis not present

## 2019-07-31 DIAGNOSIS — I4891 Unspecified atrial fibrillation: Secondary | ICD-10-CM

## 2019-07-31 DIAGNOSIS — E119 Type 2 diabetes mellitus without complications: Secondary | ICD-10-CM | POA: Diagnosis not present

## 2019-07-31 DIAGNOSIS — E782 Mixed hyperlipidemia: Secondary | ICD-10-CM

## 2019-07-31 DIAGNOSIS — E559 Vitamin D deficiency, unspecified: Secondary | ICD-10-CM

## 2019-07-31 DIAGNOSIS — E538 Deficiency of other specified B group vitamins: Secondary | ICD-10-CM

## 2019-07-31 NOTE — Progress Notes (Signed)
[name] T. Darrian Goodwill, MD Primary Care and [place] at [facility] [place], [address] Phone: [phone]  FAX: [phone]  [name] - 69 y.o. female  MRN [id number]  Date of Birth: 11/16/1950  Visit Date: 07/31/2019  PCP: Owens Loffler, MD  Referred by: Owens Loffler, MD  Chief Complaint  Patient presents with  . Follow-up    6 month    This visit occurred during the SARS-CoV-2 public health emergency.  Safety protocols were in place, including screening questions prior to the visit, additional usage of staff PPE, and extensive cleaning of exam room while observing appropriate contact time as indicated for disinfecting solutions.   Subjective:   [name] is a 69 y.o. very pleasant female patient who presents with the following:  [name] is a well-known patient and she presents in follow-up for multiple medical problems.  She does have chronic pain and fibromyalgia managed by pain management, and she thinks she is doing much better in this regard that she has a long time.  She also has had problems with depression over the years, and she thinks that this is also doing better compared to prior years.   She does take Remeron, Celexa and she is tolerating these fine.  She also was recently started on BiPAP as opposed to CPAP.  She is having some difficulty with her mask.  She also has long-term hypothyroidism and is currently not having any symptoms.  She also has high blood pressure and hyperlipidemia and these are stable.  She also has diet-controlled diabetes mellitus, and this has been stable with only diet for quite some time.  She also has atrial fib and flutter, and she has managed by cardiology for this.  This is paroxysmal.  She also has vitamin B12 deficiency.  FLP a1c Thyroid b12 D HFP BMP cbc  Pain is doing ok.  Pacing herself.  After foot surgery.   Wearing her bipap  Review of  Systems is noted in the HPI, as appropriate   Objective:   BP 120/60   Pulse 63   Temp 97.9 F (36.6 C) (Temporal)   Ht 5\' 5"  (1.651 m)   Wt 228 lb 4 oz (103.5 kg)   SpO2 97%   BMI 37.98 kg/m   GEN: WDWN, NAD, Non-toxic, A & O x 3 HEENT: Atraumatic, Normocephalic. Neck supple. No masses, No LAD. Ears and Nose: No external deformity. CV: RRR, No M/G/R. No JVD. No thrill. No extra heart sounds. PULM: CTA B, no wheezes, crackles, rhonchi. No retractions. No resp. distress. No accessory muscle use. EXTR: No c/c/e NEURO Normal gait.  PSYCH: Normally interactive. Conversant. Not depressed or anxious appearing.  Calm demeanor.   Laboratory and Imaging Data: Results for orders placed or performed in visit on 07/09/19  HM DIABETES EYE EXAM  Result Value Ref Range   HM Diabetic Eye Exam No Retinopathy No Retinopathy     Assessment and Plan:     ICD-10-CM   1. Diabetes mellitus type 2, diet-controlled (HCC)  E11.9 Hemoglobin A1c  2. Essential hypertension  I10   3. HYPERLIPIDEMIA  E78.2 Lipid panel  4. Depression, major, recurrent, in partial remission ([place])  F33.41   5. Atrial fibrillation with RVR (HCC)  I48.91   6. Other specified hypothyroidism  E03.8 T3, free    T4, free    TSH  7. Vitamin D deficiency  E55.9 VITAMIN D 25 Hydroxy (Vit-D Deficiency,  Fractures)  8. B12 deficiency  E53.8 Vitamin B12  9. Encounter for long-term (current) use of medications  Z79.899 Hepatic function panel    Basic metabolic panel    CBC with Differential/Platelet   Since she is not fasting today, I am going to have her come in for some fasting blood work.  Globally, she feels well, so I congratulated her on this and being quite compliant.  Follow-up: Return in about 6 months (around 01/28/2020) for Medicare Wellness Exam.  No orders of the defined types were placed in this encounter.  There are no discontinued medications. Orders Placed This Encounter  Procedures  . Lipid panel  .  Hepatic function panel  . Basic metabolic panel  . CBC with Differential/Platelet  . Hemoglobin A1c  . VITAMIN D 25 Hydroxy (Vit-D Deficiency, Fractures)  . Vitamin B12  . T3, free  . T4, free  . TSH    Signed,  Frederico Hamman T. Treylan Mcclintock, MD   Outpatient Encounter Medications as of 07/31/2019  Medication Sig  . acetaminophen (TYLENOL) 650 MG CR tablet Take 650 mg by mouth every 8 (eight) hours as needed (back and neck pain).   Marland Kitchen apixaban (ELIQUIS) 5 MG TABS tablet Take 1 tablet (5 mg total) by mouth 2 (two) times daily.  . B Complex Vitamins (B COMPLEX 100 PO) Take 1 tablet by mouth every morning.   . bisacodyl (DULCOLAX) 5 MG EC tablet Take 5-10 mg by mouth at bedtime.   . calcium carbonate (TUMS EX) 750 MG chewable tablet Chew 1-2 tablets by mouth 2 (two) times daily as needed for heartburn (acid reflux).  . Calcium Carbonate-Vitamin D (CALCIUM 600+D PO) Take 2 tablets by mouth at bedtime.  . carboxymethylcellulose (REFRESH PLUS) 0.5 % SOLN Place 1 drop into both eyes 3 (three) times daily as needed (dry eyes).   . cetirizine (ZYRTEC) 10 MG tablet Take 10 mg by mouth at bedtime.   . Cholecalciferol (VITAMIN D) 125 MCG (5000 UT) CAPS Take 5,000 Units by mouth every morning.   . citalopram (CELEXA) 40 MG tablet Take 1 tablet (40 mg total) by mouth daily.  . clindamycin (CLEOCIN T) 1 % lotion APPLY THIN COAT TO AFFECTED AREAS DAILY AS NEEDED  . Coenzyme Q-10 100 MG capsule Take 100 mg by mouth every morning.   . diclofenac sodium (VOLTAREN) 1 % GEL Apply 4 g topically 4 (four) times daily as needed (joint pain).   Marland Kitchen diltiazem (CARDIZEM CD) 240 MG 24 hr capsule Take 1 capsule (240 mg total) by mouth daily.  Marland Kitchen docusate sodium (COLACE) 100 MG capsule Take 200 mg by mouth at bedtime.   . fluocinonide (LIDEX) 0.05 % external solution APPLY SMALL AMOUNT TO SCALP NIGHTLY AS NEEDED DURING ACTIVE FLARING.  . gabapentin (NEURONTIN) 300 MG capsule TAKE 1 CAPSULE BY MOUTH 3  TIMES DAILY  . GRAPE SEED  EXTRACT PO Take 1 tablet by mouth 2 (two) times daily.   Marland Kitchen levothyroxine (SYNTHROID) 50 MCG tablet TAKE 1 TABLET BY MOUTH  DAILY (Patient taking differently: Take 50 mcg by mouth daily before breakfast. )  . loratadine (CLARITIN) 10 MG tablet Take 10 mg by mouth every morning.   . Magnesium Bisglycinate (MAG GLYCINATE PO) Take 400 mg by mouth at bedtime. 2 X 200 mg  . methocarbamol (ROBAXIN) 500 MG tablet TAKE ONE (1) TABLET BY MOUTH EVERY EIGHTHOURS AS NEEDED FOR MUSCLE SPASMS  . mirtazapine (REMERON) 15 MG tablet Take 1 tablet (15 mg total) by mouth at  bedtime.  Marland Kitchen morphine (MS CONTIN) 15 MG 12 hr tablet Take 1 tablet (15 mg total) by mouth 3 (three) times daily.  . nitroGLYCERIN (NITROSTAT) 0.4 MG SL tablet Place 1 tablet (0.4 mg total) under the tongue every 5 (five) minutes as needed for chest pain.  Marland Kitchen oxybutynin (DITROPAN-XL) 10 MG 24 hr tablet TAKE 1 TABLET BY MOUTH AT  BEDTIME  . RABEprazole (ACIPHEX) 20 MG tablet Take 1 tablet (20 mg total) by mouth daily. (Patient taking differently: Take 20 mg by mouth every morning. )  . tretinoin (RETIN-A) 0.05 % cream APPLY ON THE SKIN NIGHTLY USE A FEW NIGHTS A WEEK AND INCREASE TO NIGHTLY AS TOLERATED.  Marland Kitchen vitamin C (ASCORBIC ACID) 500 MG tablet Take 500 mg by mouth 2 (two) times daily.   No facility-administered encounter medications on file as of 07/31/2019.

## 2019-08-01 ENCOUNTER — Other Ambulatory Visit (INDEPENDENT_AMBULATORY_CARE_PROVIDER_SITE_OTHER): Payer: Medicare Other

## 2019-08-01 DIAGNOSIS — Z79899 Other long term (current) drug therapy: Secondary | ICD-10-CM

## 2019-08-01 DIAGNOSIS — E538 Deficiency of other specified B group vitamins: Secondary | ICD-10-CM | POA: Diagnosis not present

## 2019-08-01 DIAGNOSIS — E559 Vitamin D deficiency, unspecified: Secondary | ICD-10-CM

## 2019-08-01 DIAGNOSIS — E038 Other specified hypothyroidism: Secondary | ICD-10-CM

## 2019-08-01 DIAGNOSIS — E119 Type 2 diabetes mellitus without complications: Secondary | ICD-10-CM | POA: Diagnosis not present

## 2019-08-01 DIAGNOSIS — E782 Mixed hyperlipidemia: Secondary | ICD-10-CM | POA: Diagnosis not present

## 2019-08-01 LAB — BASIC METABOLIC PANEL
BUN: 18 mg/dL (ref 6–23)
CO2: 31 mEq/L (ref 19–32)
Calcium: 9.4 mg/dL (ref 8.4–10.5)
Chloride: 100 mEq/L (ref 96–112)
Creatinine, Ser: 0.8 mg/dL (ref 0.40–1.20)
GFR: 71.39 mL/min (ref >60.00)
Glucose, Bld: 137 mg/dL — ABNORMAL HIGH (ref 70–99)
Potassium: 3.9 mEq/L (ref 3.5–5.1)
Sodium: 139 mEq/L (ref 135–145)

## 2019-08-01 LAB — HEPATIC FUNCTION PANEL
ALT: 30 U/L (ref 0–35)
AST: 28 U/L (ref 0–37)
Albumin: 4.1 g/dL (ref 3.5–5.2)
Alkaline Phosphatase: 123 U/L — ABNORMAL HIGH (ref 39–117)
Bilirubin, Direct: 0.1 mg/dL (ref 0.0–0.3)
Total Bilirubin: 0.3 mg/dL (ref 0.2–1.2)
Total Protein: 6.9 g/dL (ref 6.0–8.3)

## 2019-08-01 LAB — CBC WITH DIFFERENTIAL/PLATELET
Basophils Absolute: 0.1 10*3/uL (ref 0.0–0.1)
Basophils Relative: 0.9 % (ref 0.0–3.0)
Eosinophils Absolute: 0.2 10*3/uL (ref 0.0–0.7)
Eosinophils Relative: 2.6 % (ref 0.0–5.0)
HCT: 35.3 % — ABNORMAL LOW (ref 36.0–46.0)
Hemoglobin: 12 g/dL (ref 12.0–15.0)
Lymphocytes Relative: 35.1 % (ref 12.0–46.0)
Lymphs Abs: 2.1 10*3/uL (ref 0.7–4.0)
MCHC: 34.1 g/dL (ref 30.0–36.0)
MCV: 89.3 fl (ref 78.0–100.0)
Monocytes Absolute: 0.3 10*3/uL (ref 0.1–1.0)
Monocytes Relative: 5.6 % (ref 3.0–12.0)
Neutro Abs: 3.4 10*3/uL (ref 1.4–7.7)
Neutrophils Relative %: 55.8 % (ref 43.0–77.0)
Platelets: 241 10*3/uL (ref 150.0–400.0)
RBC: 3.96 Mil/uL (ref 3.87–5.11)
RDW: 12.7 % (ref 11.5–15.5)
WBC: 6 10*3/uL (ref 4.0–10.5)

## 2019-08-01 LAB — HEMOGLOBIN A1C: Hgb A1c MFr Bld: 7.3 % — ABNORMAL HIGH (ref 4.6–6.5)

## 2019-08-01 LAB — LIPID PANEL
Cholesterol: 176 mg/dL (ref 0–200)
HDL: 49.6 mg/dL (ref >39.00)
LDL Cholesterol: 107 mg/dL — ABNORMAL HIGH (ref 0–99)
NonHDL: 126.69
Total CHOL/HDL Ratio: 4
Triglycerides: 96 mg/dL (ref 0.0–149.0)
VLDL: 19.2 mg/dL (ref 0.0–40.0)

## 2019-08-01 LAB — VITAMIN B12: Vitamin B-12: 383 pg/mL (ref 211–911)

## 2019-08-01 LAB — T4, FREE: Free T4: 0.82 ng/dL (ref 0.60–1.60)

## 2019-08-01 LAB — TSH: TSH: 6.67 u[IU]/mL — ABNORMAL HIGH (ref 0.35–4.50)

## 2019-08-01 LAB — VITAMIN D 25 HYDROXY (VIT D DEFICIENCY, FRACTURES): VITD: 32.97 ng/mL (ref 30.00–100.00)

## 2019-08-01 LAB — T3, FREE: T3, Free: 4.6 pg/mL — ABNORMAL HIGH (ref 2.3–4.2)

## 2019-08-12 DIAGNOSIS — G4733 Obstructive sleep apnea (adult) (pediatric): Secondary | ICD-10-CM | POA: Diagnosis not present

## 2019-08-12 DIAGNOSIS — J9610 Chronic respiratory failure, unspecified whether with hypoxia or hypercapnia: Secondary | ICD-10-CM | POA: Diagnosis not present

## 2019-08-14 ENCOUNTER — Encounter: Payer: Self-pay | Admitting: Registered Nurse

## 2019-08-14 ENCOUNTER — Other Ambulatory Visit: Payer: Self-pay

## 2019-08-14 ENCOUNTER — Encounter: Payer: Medicare Other | Attending: Physical Medicine & Rehabilitation | Admitting: Registered Nurse

## 2019-08-14 VITALS — BP 119/71 | HR 63 | Temp 97.5°F | Ht 65.0 in | Wt 230.0 lb

## 2019-08-14 DIAGNOSIS — G8929 Other chronic pain: Secondary | ICD-10-CM

## 2019-08-14 DIAGNOSIS — Z79891 Long term (current) use of opiate analgesic: Secondary | ICD-10-CM | POA: Insufficient documentation

## 2019-08-14 DIAGNOSIS — G894 Chronic pain syndrome: Secondary | ICD-10-CM | POA: Insufficient documentation

## 2019-08-14 DIAGNOSIS — M961 Postlaminectomy syndrome, not elsewhere classified: Secondary | ICD-10-CM | POA: Diagnosis not present

## 2019-08-14 DIAGNOSIS — Z79899 Other long term (current) drug therapy: Secondary | ICD-10-CM | POA: Diagnosis not present

## 2019-08-14 DIAGNOSIS — Z5181 Encounter for therapeutic drug level monitoring: Secondary | ICD-10-CM | POA: Diagnosis not present

## 2019-08-14 DIAGNOSIS — M546 Pain in thoracic spine: Secondary | ICD-10-CM

## 2019-08-14 MED ORDER — MORPHINE SULFATE ER 15 MG PO TBCR: 15.0000 mg | EXTENDED_RELEASE_TABLET | Freq: Three times a day (TID) | ORAL | 0 refills | Status: DC

## 2019-08-14 NOTE — Progress Notes (Signed)
Subjective:    Patient ID: [name], female    DOB: [birth year], 69 y.o.   MRN: [id number]  HPI: [name] is a 69 y.o. female who returns for follow up appointment for chronic pain and medication refill. She states her pain is located in her mid- back. She rates her pain 5. Her current exercise regime is walking and performing stretching exercises.  [name] Morphine equivalent is 45.00  MME.    Last UDS was Performed on 05/15/2019, it was consistent.   Pain Inventory Average Pain 5 Pain Right Now 5 My pain is constant, burning and aching  In the last 24 hours, has pain interfered with the following? General activity 5 Relation with others 6 Enjoyment of life 5 What TIME of day is your pain at its worst? evening Sleep (in general) Fair  Pain is worse with: walking, bending, standing and some activites Pain improves with: rest, heat/ice, pacing activities and medication Relief from Meds: .  Mobility walk without assistance ability to climb steps?  yes do you drive?  yes  Function retired I need assistance with the following:  meal prep, household duties and shopping  Neuro/Psych bladder control problems weakness numbness tremor tingling depression  Prior Studies Any changes since last visit?  no  Physicians involved in your care Any changes since last visit?  no   Family History  Problem Relation Age of Onset  . Breast cancer Mother   . Diabetes Mother   . Heart disease Mother   . Allergies Mother   . Diabetes Father   . Heart disease Father   . Emphysema Father   . Allergies Father   . Stomach cancer Maternal Uncle   . Breast cancer Paternal Aunt   . Colon cancer Neg Hx    Social History   Socioeconomic History  . Marital status: Married    Spouse name: Not on file  . Number of children: Not on file  . Years of education: Not on file  . Highest education level: Not on file  Occupational History  . Occupation: Radiation protection practitioner  .  Occupation: retired  Tobacco Use  . Smoking status: Never Smoker  . Smokeless tobacco: Never Used  Substance and Sexual Activity  . Alcohol use: Not Currently    Alcohol/week: 0.0 standard drinks    Comment: rare  . Drug use: Yes    Types: Morphine  . Sexual activity: Not Currently  Other Topics Concern  . Not on file  Social History Narrative  . Not on file   Social Determinants of Health   Financial Resource Strain: Low Risk   . Difficulty of Paying Living Expenses: Not hard at all  Food Insecurity: No Food Insecurity  . Worried About Charity fundraiser in the Last Year: Never true  . Ran Out of Food in the Last Year: Never true  Transportation Needs: No Transportation Needs  . Lack of Transportation (Medical): No  . Lack of Transportation (Non-Medical): No  Physical Activity: Inactive  . Days of Exercise per Week: 0 days  . Minutes of Exercise per Session: 0 min  Stress: No Stress Concern Present  . Feeling of Stress : Not at all  Social Connections:   . Frequency of Communication with Friends and Family: Not on file  . Frequency of Social Gatherings with Friends and Family: Not on file  . Attends Religious Services: Not on file  . Active Member of Clubs or Organizations: Not on file  .  Attends Archivist Meetings: Not on file  . Marital Status: Not on file   Past Surgical History:  Procedure Laterality Date  . [place] STUDY N/A 01/04/2016   Procedure: [place] STUDY;  Surgeon: Manus Gunning, MD;  Location: [facility] ENDOSCOPY;  Service: Gastroenterology;  Laterality: N/A;  . ABDOMINAL HYSTERECTOMY  1988  . ADENOIDECTOMY    . ANTERIOR CERVICAL DECOMP/DISCECTOMY FUSION  2009, 1993   first procedure in ~ 1993/Dr Botero  . APPENDECTOMY  1988  . BACK SURGERY    . CARDIOVERSION N/A 04/03/2019   Procedure: CARDIOVERSION;  Surgeon: Dorothy Spark, MD;  Location: [facility] ENDOSCOPY;  Service: Cardiovascular;  Laterality: N/A;  . CHOLECYSTECTOMY  2005  .  ESOPHAGEAL MANOMETRY N/A 01/04/2016   Procedure: ESOPHAGEAL MANOMETRY (EM) 24 HR PH;  Surgeon: Manus Gunning, MD;  Location: [facility] ENDOSCOPY;  Service: Gastroenterology;  Laterality: N/A;  . ESOPHAGOGASTRODUODENOSCOPY (EGD) WITH PROPOFOL N/A 05/17/2016   Procedure: ESOPHAGOGASTRODUODENOSCOPY (EGD) WITH PROPOFOL;  Surgeon: Manus Gunning, MD;  Location: [facility] ENDOSCOPY;  Service: Gastroenterology;  Laterality: N/A;  . NASAL SINUS SURGERY  2006  . SAVORY DILATION N/A 05/17/2016   Procedure: SAVORY DILATION;  Surgeon: Manus Gunning, MD;  Location: [facility];  Service: Gastroenterology;  Laterality: N/A;  . TEE WITHOUT CARDIOVERSION N/A 04/03/2019   Procedure: TRANSESOPHAGEAL ECHOCARDIOGRAM (TEE)-with DCCV;  Surgeon: Dorothy Spark, MD;  Location: [facility] ENDOSCOPY;; EF 55 to 60%.  No LVH.  Normal RV.  Moderate LA dilation.  No thrombus.  Mild RA dilation.  Mild to moderate TR.  Mild aortic valve sclerosis, no stenosis.  Moderately elevated PA pressures (47 mmHg)  . TONSILLECTOMY AND ADENOIDECTOMY    . TRANSTHORACIC ECHOCARDIOGRAM  2009   Normal LV size and function. EF 60-65%. No regional wall motion and amount is. Mild aortic sclerosis, no stenosis  . TUBAL LIGATION  1979   Past Medical History:  Diagnosis Date  . Allergy   . Anemia   . Arthritis   . B12 deficiency   . Complication of anesthesia    slow to wake up   . Depression   . Diabetes mellitus type 2, diet-controlled ([place]) 07/24/2018  . Fibromyalgia   . Generalized hyperhidrosis 04/17/2014  . GERD (gastroesophageal reflux disease)   . Hyperlipemia   . HYPOTHYROIDISM 05/29/2007  . Irritable bowel syndrome   . Migraines    hx of   . OSA (obstructive sleep apnea) 06/26/2019  . Oxygen deficiency    2 L at bedtime  . PVC (premature ventricular contraction)   . Senile calcific aortic valve sclerosis    Aortic Sclerosis-no stenosis  . Unspecified essential hypertension    denies htn on 05/09/16   BP 119/71    Pulse 63   Temp (!) 97.5 F (36.4 C)   Ht 5\' 5"  (1.651 m)   Wt 230 lb (104.3 kg)   SpO2 97%   BMI 38.27 kg/m   Opioid Risk Score:   Fall Risk Score:  `1  Depression screen PHQ 2/9  Depression screen [facility] 2/9 06/19/2019 01/18/2019 08/08/2018 04/24/2018 01/10/2018 11/24/2017 03/27/2017  Decreased Interest 1 0 0 0 2 1 0  Down, Depressed, Hopeless 1 0 0 0 0 1 0  PHQ - 2 Score 2 0 0 0 2 2 0  Altered sleeping - 0 - - 0 - -  Tired, decreased energy - 3 - - 1 - -  Change in appetite - 0 - - 0 - -  Feeling bad or failure about yourself  - 0 - - 0 - -  Trouble concentrating - 0 - - 1 - -  Moving slowly or fidgety/restless - 0 - - 0 - -  Suicidal thoughts - 0 - - 0 - -  PHQ-9 Score - 3 - - 4 - -  Difficult doing work/chores - Not difficult at all - - Not difficult at all Not difficult at all -  Some recent data might be hidden     Review of Systems  Constitutional: Negative.   HENT: Negative.   Eyes: Negative.   Respiratory: Negative.   Cardiovascular: Negative.   Gastrointestinal: Negative.   Endocrine: Negative.   Genitourinary: Positive for difficulty urinating.  Musculoskeletal: Positive for arthralgias and back pain.  Skin: Negative.   Allergic/Immunologic: Negative.   Neurological: Positive for weakness and numbness.  Hematological: Negative.   Psychiatric/Behavioral: Positive for dysphoric mood.  All other systems reviewed and are negative.      Objective:   Physical Exam Vitals and nursing note reviewed.  Constitutional:      Appearance: Normal appearance.  Cardiovascular:     Rate and Rhythm: Normal rate and regular rhythm.     Pulses: Normal pulses.     Heart sounds: Normal heart sounds.  Pulmonary:     Effort: Pulmonary effort is normal.     Breath sounds: Normal breath sounds.  Musculoskeletal:     Cervical back: Normal range of motion and neck supple.     Comments: Normal Muscle Bulk and Muscle Testing Reveals: Upper Extremities: Full ROM and Muscle  Strength 5/5 Thoracic Paraspinal Tenderness: T-3-T-7  Lower Extremities: Full ROM and Muscle Strength 5/5 Arises from Table with ease Narrow Based Gait   Skin:    General: Skin is warm and dry.  Neurological:     Mental Status: She is alert and oriented to person, place, and time.  Psychiatric:        Mood and Affect: Mood normal.        Behavior: Behavior normal.           Assessment & Plan:  1. Cervical postlaminectomy syndrome:/ Cervicalgia/ Cervical Radiculitis/with neck and shoulder pain/ Chronic Midline Thoracic Pain.08/14/2019.Continue exercise routine and using heat therapy.Refilled:current medication regimen withMS Contin 15 mg one tablet three times a day #90.  We will continue the opioid monitoring program, this consists of regular clinic visits, examinations, urine drug screen, pill counts as well as use of [place] Controlled Substance reporting System. 2. Fibromyalgia/ Neuropathy: Continuecurrent medication regimen withGabapentin, activity and exercise regime.08/14/2019. 3.Left Knee Pain/BilateralKnee Pain/ Mild Degenerative Changes:.Continuecurrent medication regimen withVoltaren Gel.08/14/2019. 4.BilateralAnkle Pain:No complaints today.Continue HEP as tolerated. Continueto monitor.08/14/2019. 5. Muscle Spasm: Continuecurrent medication regimen withRobaxin.08/14/2019.. 6. Opioid Induced Constipation: No complaints Continue to Monitor.08/14/2019. 7. Midline Low Back Pain: Continue current medication regime, continue HEP as tolerated.08/14/2019. 8. Right Gluteal Medius Weakness and Atrophy: No complaints Today:Continue with Hip Abduction Exercisesas tolerated: Continue to Monitor.08/14/2019. 9. Right Greater Trochanter Bursitis:No Complaints Today.Continue to Alternate Ice and Heat Therapy. Continue to Monitor.08/14/2019 10. Sacroiliac Joint Pain:No complaints today.S/PSacroiliac Injection on 08/24/2018 with good relief  noted.08/14/2019 11.Lumbar Radiculitis: Continue Gabapentin.Continue HEP as tolerated. Continue current medication regimen. Continue to monitor.08/14/2019.  F/U in 1 month  15 minutes of face to face patient care time was spent during this visit. All questions were encouraged and answered.

## 2019-08-15 ENCOUNTER — Other Ambulatory Visit: Payer: Self-pay

## 2019-08-16 ENCOUNTER — Telehealth: Payer: Self-pay | Admitting: Registered Nurse

## 2019-08-16 MED ORDER — MORPHINE SULFATE ER 15 MG PO TBCR: 15.0000 mg | EXTENDED_RELEASE_TABLET | Freq: Three times a day (TID) | ORAL | 0 refills | Status: DC

## 2019-08-16 NOTE — Telephone Encounter (Signed)
PMP was Reviewed: [facility] was called, they never received the morphine prescription on 08/14/2019, due to a system failure. Morphine prescription e-scribe. Ms. Weinrich is aware via My-Chart message.

## 2019-08-22 ENCOUNTER — Telehealth: Payer: Self-pay | Admitting: Family Medicine

## 2019-08-22 ENCOUNTER — Ambulatory Visit: Payer: Medicare Other | Attending: Internal Medicine

## 2019-08-22 DIAGNOSIS — Z23 Encounter for immunization: Secondary | ICD-10-CM | POA: Insufficient documentation

## 2019-08-22 NOTE — Chronic Care Management (AMB) (Signed)
  Chronic Care Management   Note  08/22/2019 Name: [name] MRN: [id number] DOB: 08/13/1950  [name] is a 69 y.o. year old female who is a primary care patient of Copland, Frederico Hamman, MD. I reached out to [name] by phone today in response to a referral sent by [name]'s PCP, Owens Loffler, MD.   [name] was given information about Chronic Care Management services today including:  1. CCM service includes personalized support from designated clinical staff supervised by her physician, including individualized plan of care and coordination with other care providers 2. 24/7 contact phone numbers for assistance for urgent and routine care needs. 3. Service will only be billed when office clinical staff spend 20 minutes or more in a month to coordinate care. 4. Only one practitioner may furnish and bill the service in a calendar month. 5. The patient may stop CCM services at any time (effective at the end of the month) by phone call to the office staff. 6. The patient will be responsible for cost sharing (co-pay) of up to 20% of the service fee (after annual deductible is met).  Patient agreed to services and verbal consent obtained.   Follow up plan:   Raynicia Dukes [employer] Scheduler

## 2019-08-22 NOTE — Progress Notes (Signed)
   Covid-19 Vaccination Clinic  Name:  [name]    MRN: [id number] DOB: Apr 19, 1951  08/22/2019  [name] was observed post Covid-19 immunization for 30 minutes based on pre-vaccination screening without incidence. She was provided with Vaccine Information Sheet and instruction to access the V-Safe system.   [name] was instructed to call 911 with any severe reactions post vaccine: Marland Kitchen Difficulty breathing  . Swelling of your face and throat  . A fast heartbeat  . A bad rash all over your body  . Dizziness and weakness    Immunizations Administered    Name Date Dose VIS Date Route   Pfizer COVID-19 Vaccine 08/22/2019  3:44 PM 0.3 mL 06/07/2019 Intramuscular   Manufacturer: [place]   Lot: [id number]   [place]: [id number]

## 2019-08-28 ENCOUNTER — Telehealth: Payer: Self-pay

## 2019-08-28 DIAGNOSIS — I4891 Unspecified atrial fibrillation: Secondary | ICD-10-CM

## 2019-08-28 DIAGNOSIS — I10 Essential (primary) hypertension: Secondary | ICD-10-CM

## 2019-08-28 NOTE — Telephone Encounter (Signed)
I would like to request a referral for [name] to chronic care management pharmacy services for the following conditions:   Essential hypertension, benign  [I10]  Atrial fibrillation ([place]) [I48.91]  Debbora Dus, PharmD Clinical Pharmacist [place] [facility] [phone]

## 2019-08-29 ENCOUNTER — Encounter (HOSPITAL_COMMUNITY): Payer: Self-pay | Admitting: Physician Assistant

## 2019-08-29 ENCOUNTER — Other Ambulatory Visit: Payer: Self-pay

## 2019-08-29 ENCOUNTER — Ambulatory Visit: Payer: Medicare Other

## 2019-08-29 ENCOUNTER — Ambulatory Visit (HOSPITAL_COMMUNITY)
Admission: RE | Admit: 2019-08-29 | Discharge: 2019-08-29 | Disposition: A | Discharge status: Home / Self Care | Payer: Medicare Other | Source: Ambulatory Visit | Attending: Physician Assistant | Admitting: Physician Assistant

## 2019-08-29 VITALS — BP 130/68 | HR 115 | Ht 65.0 in | Wt 229.2 lb

## 2019-08-29 DIAGNOSIS — G4733 Obstructive sleep apnea (adult) (pediatric): Secondary | ICD-10-CM | POA: Diagnosis not present

## 2019-08-29 DIAGNOSIS — I483 Typical atrial flutter: Secondary | ICD-10-CM | POA: Diagnosis not present

## 2019-08-29 DIAGNOSIS — E119 Type 2 diabetes mellitus without complications: Secondary | ICD-10-CM | POA: Insufficient documentation

## 2019-08-29 DIAGNOSIS — Z885 Allergy status to narcotic agent status: Secondary | ICD-10-CM | POA: Insufficient documentation

## 2019-08-29 DIAGNOSIS — E785 Hyperlipidemia, unspecified: Secondary | ICD-10-CM | POA: Insufficient documentation

## 2019-08-29 DIAGNOSIS — Z79890 Hormone replacement therapy (postmenopausal): Secondary | ICD-10-CM | POA: Diagnosis not present

## 2019-08-29 DIAGNOSIS — E669 Obesity, unspecified: Secondary | ICD-10-CM | POA: Diagnosis not present

## 2019-08-29 DIAGNOSIS — Z888 Allergy status to other drugs, medicaments and biological substances status: Secondary | ICD-10-CM | POA: Diagnosis not present

## 2019-08-29 DIAGNOSIS — Z79899 Other long term (current) drug therapy: Secondary | ICD-10-CM | POA: Insufficient documentation

## 2019-08-29 DIAGNOSIS — Z886 Allergy status to analgesic agent status: Secondary | ICD-10-CM | POA: Diagnosis not present

## 2019-08-29 DIAGNOSIS — Z833 Family history of diabetes mellitus: Secondary | ICD-10-CM | POA: Diagnosis not present

## 2019-08-29 DIAGNOSIS — I10 Essential (primary) hypertension: Secondary | ICD-10-CM | POA: Diagnosis not present

## 2019-08-29 DIAGNOSIS — M797 Fibromyalgia: Secondary | ICD-10-CM | POA: Insufficient documentation

## 2019-08-29 DIAGNOSIS — Z7901 Long term (current) use of anticoagulants: Secondary | ICD-10-CM | POA: Diagnosis not present

## 2019-08-29 DIAGNOSIS — D6869 Other thrombophilia: Secondary | ICD-10-CM | POA: Diagnosis not present

## 2019-08-29 DIAGNOSIS — Z880 Allergy status to penicillin: Secondary | ICD-10-CM | POA: Insufficient documentation

## 2019-08-29 DIAGNOSIS — Z8249 Family history of ischemic heart disease and other diseases of the circulatory system: Secondary | ICD-10-CM | POA: Insufficient documentation

## 2019-08-29 DIAGNOSIS — I480 Paroxysmal atrial fibrillation: Secondary | ICD-10-CM

## 2019-08-29 DIAGNOSIS — E039 Hypothyroidism, unspecified: Secondary | ICD-10-CM | POA: Diagnosis not present

## 2019-08-29 DIAGNOSIS — Z6838 Body mass index (BMI) 38.0-38.9, adult: Secondary | ICD-10-CM | POA: Insufficient documentation

## 2019-08-29 DIAGNOSIS — I4819 Other persistent atrial fibrillation: Secondary | ICD-10-CM | POA: Insufficient documentation

## 2019-08-29 LAB — CBC
HCT: 39.1 % (ref 36.0–46.0)
Hemoglobin: 13 g/dL (ref 12.0–15.0)
MCH: 29.9 pg (ref 26.0–34.0)
MCHC: 33.2 g/dL (ref 30.0–36.0)
MCV: 89.9 fL (ref 80.0–100.0)
Platelets: 262 10*3/uL (ref 150–400)
RBC: 4.35 MIL/uL (ref 3.87–5.11)
RDW: 11.9 % (ref 11.5–15.5)
WBC: 6 10*3/uL (ref 4.0–10.5)
nRBC: 0 % (ref 0.0–0.2)

## 2019-08-29 LAB — BASIC METABOLIC PANEL
Anion gap: 11 (ref 5–15)
BUN: 11 mg/dL (ref 8–23)
CO2: 26 mmol/L (ref 22–32)
Calcium: 9.4 mg/dL (ref 8.9–10.3)
Chloride: 103 mmol/L (ref 98–111)
Creatinine, Ser: 0.71 mg/dL (ref 0.44–1.00)
GFR calc Af Amer: >60 mL/min (ref >60)
GFR calc non Af Amer: >60 mL/min (ref >60)
Glucose, Bld: 123 mg/dL — ABNORMAL HIGH (ref 70–99)
Potassium: 4.3 mmol/L (ref 3.5–5.1)
Sodium: 140 mmol/L (ref 135–145)

## 2019-08-29 MED ORDER — DILTIAZEM HCL ER COATED BEADS 120 MG PO CP24: 120.0000 mg | ORAL_CAPSULE | Freq: Every day | ORAL | 1 refills | Status: DC

## 2019-08-29 MED ORDER — FLECAINIDE ACETATE 50 MG PO TABS: 50.0000 mg | ORAL_TABLET | Freq: Two times a day (BID) | ORAL | 3 refills | Status: DC

## 2019-08-29 NOTE — Patient Instructions (Addendum)
Cardioversion scheduled for Monday, March 15th  - Arrive at the [facility] and go to admitting at [place] not eat or drink anything after midnight the night prior to your procedure.  - Take all your morning medication with a sip of water prior to arrival.  - You will not be able to drive home after your procedure.  Add cardizem 120mg  at bedtime until day of cardioversion then you will stop this.  3 days prior to cardioversion you will start flecainide 50mg  twice a day

## 2019-08-29 NOTE — Telephone Encounter (Signed)
Please sign referral

## 2019-08-29 NOTE — Progress Notes (Signed)
Primary Care Physician: Owens Loffler, MD Primary Cardiologist: Dr Ellyn Hack Primary Electrophysiologist: none Referring Physician: Dr Bary Leriche is a 69 y.o. female with a history of fibromyalgia, DM, atrial flutter, HTN, OSA, paroxysmal atrial fibrillation who presents for consultation in the [facility].  The patient was initially diagnosed with atrial fibrillation 04/01/19 after presenting with one week of fatigue and weakness. She underwent TEE/DCCV on 04/03/19. Patient is on Eliquis for a CHADS2VASC score of 4. She reports that she has done well since then until the night of 08/28/19 when she noted heart racing symptoms with some lightheaded. When she got up the next morning and check her home ECG device she was in afib with RVR. There were no specific triggers that the patient could identify however, she did have a COVID vaccine in the last two weeks. She is on BiPap for OSA. She denies any significant alcohol use.   Today, she denies symptoms of chest pain, shortness of breath, orthopnea, PND, lower extremity edema, presyncope, syncope, bleeding, or neurologic sequela. The patient is tolerating medications without difficulties and is otherwise without complaint today.    Atrial Fibrillation Risk Factors:  she does have symptoms or diagnosis of sleep apnea. she is compliant with BiPAP therapy. she does not have a history of rheumatic fever. she does not have a history of alcohol use. The patient does not have a history of early familial atrial fibrillation or other arrhythmias.  she has a BMI of Body mass index is 38.14 kg/m.Marland Kitchen Filed Weights   08/29/19 1501  Weight: 104 kg    Family History  Problem Relation Age of Onset  . Breast cancer Mother   . Diabetes Mother   . Heart disease Mother   . Allergies Mother   . Diabetes Father   . Heart disease Father   . Emphysema Father   . Allergies Father   . Stomach cancer Maternal Uncle     . Breast cancer Paternal Aunt   . Colon cancer Neg Hx      Atrial Fibrillation Management history:  Previous antiarrhythmic drugs: none Previous cardioversions: 04/03/19 Previous ablations: none CHADS2VASC score: 4 Anticoagulation history: Eliquis   Past Medical History:  Diagnosis Date  . Allergy   . Anemia   . Arthritis   . B12 deficiency   . Complication of anesthesia    slow to wake up   . Depression   . Diabetes mellitus type 2, diet-controlled ([place]) 07/24/2018  . Fibromyalgia   . Generalized hyperhidrosis 04/17/2014  . GERD (gastroesophageal reflux disease)   . Hyperlipemia   . HYPOTHYROIDISM 05/29/2007  . Irritable bowel syndrome   . Migraines    hx of   . OSA (obstructive sleep apnea) 06/26/2019  . Oxygen deficiency    2 L at bedtime  . PVC (premature ventricular contraction)   . Senile calcific aortic valve sclerosis    Aortic Sclerosis-no stenosis  . Unspecified essential hypertension    denies htn on 05/09/16   Past Surgical History:  Procedure Laterality Date  . [place] STUDY N/A 01/04/2016   Procedure: [place] STUDY;  Surgeon: Manus Gunning, MD;  Location: [facility] ENDOSCOPY;  Service: Gastroenterology;  Laterality: N/A;  . ABDOMINAL HYSTERECTOMY  1988  . ADENOIDECTOMY    . ANTERIOR CERVICAL DECOMP/DISCECTOMY FUSION  2009, 1993   first procedure in ~ 1993/Dr Botero  . APPENDECTOMY  1988  . BACK SURGERY    .  CARDIOVERSION N/A 04/03/2019   Procedure: CARDIOVERSION;  Surgeon: Dorothy Spark, MD;  Location: [facility] ENDOSCOPY;  Service: Cardiovascular;  Laterality: N/A;  . CHOLECYSTECTOMY  2005  . ESOPHAGEAL MANOMETRY N/A 01/04/2016   Procedure: ESOPHAGEAL MANOMETRY (EM) 24 HR PH;  Surgeon: Manus Gunning, MD;  Location: [facility] ENDOSCOPY;  Service: Gastroenterology;  Laterality: N/A;  . ESOPHAGOGASTRODUODENOSCOPY (EGD) WITH PROPOFOL N/A 05/17/2016   Procedure: ESOPHAGOGASTRODUODENOSCOPY (EGD) WITH PROPOFOL;  Surgeon: Manus Gunning, MD;   Location: [facility] ENDOSCOPY;  Service: Gastroenterology;  Laterality: N/A;  . NASAL SINUS SURGERY  2006  . SAVORY DILATION N/A 05/17/2016   Procedure: SAVORY DILATION;  Surgeon: Manus Gunning, MD;  Location: [facility]OS[facility];  Service: Gastroenterology;  Laterality: N/A;  . TEE WITHOUT CARDIOVERSION N/A 04/03/2019   Procedure: TRANSESOPHAGEAL ECHOCARDIOGRAM (TEE)-with DCCV;  Surgeon: Dorothy Spark, MD;  Location: [facility] ENDOSCOPY;; EF 55 to 60%.  No LVH.  Normal RV.  Moderate LA dilation.  No thrombus.  Mild RA dilation.  Mild to moderate TR.  Mild aortic valve sclerosis, no stenosis.  Moderately elevated PA pressures (47 mmHg)  . TONSILLECTOMY AND ADENOIDECTOMY    . TRANSTHORACIC ECHOCARDIOGRAM  2009   Normal LV size and function. EF 60-65%. No regional wall motion and amount is. Mild aortic sclerosis, no stenosis  . TUBAL LIGATION  1979    Current Outpatient Medications  Medication Sig Dispense Refill  . acetaminophen (TYLENOL) 650 MG CR tablet Take 650 mg by mouth every 8 (eight) hours as needed (back and neck pain).     Marland Kitchen apixaban (ELIQUIS) 5 MG TABS tablet Take 1 tablet (5 mg total) by mouth 2 (two) times daily. 180 tablet 1  . B Complex Vitamins (B COMPLEX 100 PO) Take 1 tablet by mouth every morning.     . bisacodyl (DULCOLAX) 5 MG EC tablet Take 5-10 mg by mouth at bedtime.     . calcium carbonate (TUMS EX) 750 MG chewable tablet Chew 1-2 tablets by mouth 2 (two) times daily as needed for heartburn (acid reflux).    . Calcium Carbonate-Vitamin D (CALCIUM 600+D PO) Take 2 tablets by mouth at bedtime.    . carboxymethylcellulose (REFRESH PLUS) 0.5 % SOLN Place 1 drop into both eyes 3 (three) times daily as needed (dry eyes).     . cetirizine (ZYRTEC) 10 MG tablet Take 10 mg by mouth at bedtime.     . Cholecalciferol (VITAMIN D) 125 MCG (5000 UT) CAPS Take 5,000 Units by mouth every morning.     . citalopram (CELEXA) 40 MG tablet Take 1 tablet (40 mg total) by mouth daily.    .  clindamycin (CLEOCIN T) 1 % lotion APPLY THIN COAT TO AFFECTED AREAS DAILY AS NEEDED    . Coenzyme Q-10 100 MG capsule Take 100 mg by mouth every morning.     . diclofenac sodium (VOLTAREN) 1 % GEL Apply 4 g topically 4 (four) times daily as needed (joint pain).     Marland Kitchen diltiazem (CARDIZEM CD) 240 MG 24 hr capsule Take 1 capsule (240 mg total) by mouth daily. 90 capsule 1  . docusate sodium (COLACE) 100 MG capsule Take 200 mg by mouth at bedtime.     . fluocinonide (LIDEX) 0.05 % external solution APPLY SMALL AMOUNT TO SCALP NIGHTLY AS NEEDED DURING ACTIVE FLARING.    . gabapentin (NEURONTIN) 300 MG capsule TAKE 1 CAPSULE BY MOUTH 3  TIMES DAILY 270 capsule 3  . GRAPE SEED EXTRACT PO Take 1 tablet by  mouth 2 (two) times daily.     Marland Kitchen levothyroxine (SYNTHROID) 50 MCG tablet TAKE 1 TABLET BY MOUTH  DAILY (Patient taking differently: Take 50 mcg by mouth daily before breakfast. ) 90 tablet 3  . loratadine (CLARITIN) 10 MG tablet Take 10 mg by mouth every morning.     . Magnesium Bisglycinate (MAG GLYCINATE PO) Take 400 mg by mouth at bedtime. 2 X 200 mg    . methocarbamol (ROBAXIN) 500 MG tablet TAKE ONE (1) TABLET BY MOUTH EVERY EIGHTHOURS AS NEEDED FOR MUSCLE SPASMS 270 tablet 3  . mirtazapine (REMERON) 15 MG tablet Take 1 tablet (15 mg total) by mouth at bedtime.    Marland Kitchen morphine (MS CONTIN) 15 MG 12 hr tablet Take 1 tablet (15 mg total) by mouth 3 (three) times daily. 90 tablet 0  . nitroGLYCERIN (NITROSTAT) 0.4 MG SL tablet Place 1 tablet (0.4 mg total) under the tongue every 5 (five) minutes as needed for chest pain. 90 tablet 3  . oxybutynin (DITROPAN-XL) 10 MG 24 hr tablet TAKE 1 TABLET BY MOUTH AT  BEDTIME 90 tablet 1  . RABEprazole (ACIPHEX) 20 MG tablet Take 1 tablet (20 mg total) by mouth daily. (Patient taking differently: Take 20 mg by mouth every morning. ) 90 tablet 3  . tretinoin (RETIN-A) 0.05 % cream APPLY ON THE SKIN NIGHTLY USE A FEW NIGHTS A WEEK AND INCREASE TO NIGHTLY AS TOLERATED.      Marland Kitchen vitamin B-12 (CYANOCOBALAMIN) 1000 MCG tablet Take 1,000 mcg by mouth daily.    . vitamin C (ASCORBIC ACID) 500 MG tablet Take 500 mg by mouth 2 (two) times daily.    Marland Kitchen diltiazem (CARDIZEM CD) 120 MG 24 hr capsule Take 1 capsule (120 mg total) by mouth at bedtime. 30 capsule 1  . flecainide (TAMBOCOR) 50 MG tablet Take 1 tablet (50 mg total) by mouth 2 (two) times daily. 60 tablet 3   No current facility-administered medications for this encounter.    Allergies  Allergen Reactions  . Rizatriptan Benzoate Anaphylaxis    Throat and Tongue Swelling Closed  . Sumatriptan Swelling    Throat closed & Tongue Swelling  . Tizanidine Shortness Of Breath  . Aleve [Naproxen Sodium] Hives  . Clarithromycin Hives  . Cyclobenzaprine Hcl Swelling    Reaction - throat closed, tongue swelling  . Doxycycline Diarrhea and Nausea And Vomiting  . Duloxetine Nausea Only  . Keflex [Cephalexin] Itching and Other (See Comments)    Sores  . Metronidazole Diarrhea and Nausea Only  . Oxycodone Hcl Diarrhea and Nausea And Vomiting  . Oxycontin [Oxycodone Hcl] Nausea And Vomiting  . Penicillins Other (See Comments)    Unknown reaction Did it involve swelling of the face/tongue/throat, SOB, or low BP? Unknown Did it involve sudden or severe rash/hives, skin peeling, or any reaction on the inside of your mouth or nose? Unknown Did you need to seek medical attention at a hospital or doctor's office? Unknown When did it last happen?infant If all above answers are "NO", may proceed with cephalosporin use.  . Rosuvastatin Other (See Comments)    Muscle aches  . Venlafaxine Hives  . Voltaren [Diclofenac Sodium] Hives    Reaction to oral med, uses gel occasionally  . Septra [Sulfamethoxazole-Trimethoprim] Itching and Rash  . Tramadol Diarrhea, Nausea And Vomiting and Palpitations    Social History   Socioeconomic History  . Marital status: Married    Spouse name: Not on file  . Number of children:  Not  on file  . Years of education: Not on file  . Highest education level: Not on file  Occupational History  . Occupation: Radiation protection practitioner  . Occupation: retired  Tobacco Use  . Smoking status: Never Smoker  . Smokeless tobacco: Never Used  Substance and Sexual Activity  . Alcohol use: Yes    Alcohol/week: 1.0 standard drinks    Types: 1 Glasses of wine per week    Comment: rare  . Drug use: Never  . Sexual activity: Not Currently  Other Topics Concern  . Not on file  Social History Narrative  . Not on file   Social Determinants of Health   Financial Resource Strain: Low Risk   . Difficulty of Paying Living Expenses: Not hard at all  Food Insecurity: No Food Insecurity  . Worried About Charity fundraiser in the Last Year: Never true  . Ran Out of Food in the Last Year: Never true  Transportation Needs: No Transportation Needs  . Lack of Transportation (Medical): No  . Lack of Transportation (Non-Medical): No  Physical Activity: Inactive  . Days of Exercise per Week: 0 days  . Minutes of Exercise per Session: 0 min  Stress: No Stress Concern Present  . Feeling of Stress : Not at all  Social Connections:   . Frequency of Communication with Friends and Family: Not on file  . Frequency of Social Gatherings with Friends and Family: Not on file  . Attends Religious Services: Not on file  . Active Member of Clubs or Organizations: Not on file  . Attends Archivist Meetings: Not on file  . Marital Status: Not on file  Intimate Partner Violence: Not At Risk  . Fear of Current or Ex-Partner: No  . Emotionally Abused: No  . Physically Abused: No  . Sexually Abused: No     ROS- All systems are reviewed and negative except as per the HPI above.  Physical Exam: Vitals:   08/29/19 1501  BP: 130/68  Pulse: (!) 115  Weight: 104 kg  Height: 5\' 5"  (1.651 m)    GEN- The patient is well appearing obese female, alert and oriented x 3 today.   Head- normocephalic,  atraumatic Eyes-  Sclera clear, conjunctiva pink Ears- hearing intact Oropharynx- clear Neck- supple  Lungs- Clear to ausculation bilaterally, normal work of breathing Heart- irregular rate and rhythm, tachycardia, no murmurs, rubs or gallops  GI- soft, NT, ND, + BS Extremities- no clubbing, cyanosis, or edema MS- no significant deformity or atrophy Skin- no rash or lesion Psych- euthymic mood, full affect Neuro- strength and sensation are intact  Wt Readings from Last 3 Encounters:  08/29/19 104 kg  08/14/19 104.3 kg  07/31/19 103.5 kg    EKG today demonstrates afib HR 115, QRS 78, QTc 379  TEE 04/03/19 demonstrated  1. Left ventricular ejection fraction, by visual estimation, is 55 to  60%. The left ventricle has normal function. Normal left ventricular size.  There is no left ventricular hypertrophy.  2. Global right ventricle has normal systolic function.The right  ventricular size is normal. No increase in right ventricular wall  thickness.  3. Left atrial size was moderately dilated.  4. No thrombus is seen in the left atrium or left atrial appendage.  5. Right atrial size was mildly dilated.  6. The mitral valve is normal in structure. Mild mitral valve  regurgitation. No evidence of mitral stenosis.  7. The tricuspid valve is normal in structure. Tricuspid valve  regurgitation  mild-moderate.  8. The aortic valve is normal in structure. Aortic valve regurgitation is  mild by color flow Doppler. Mild aortic valve sclerosis without stenosis.  9. The pulmonic valve was normal in structure. Pulmonic valve  regurgitation is not visualized by color flow Doppler.  10. Moderately elevated pulmonary artery systolic pressure.  11. The tricuspid regurgitant velocity is 3.12 m/s, and with an assumed  right atrial pressure of 8 mmHg, the estimated right ventricular systolic  pressure is moderately elevated at 46.9 mmHg.  12. The inferior vena cava is normal in size with  greater than 50%  respiratory variability, suggesting right atrial pressure of 3 mmHg.  13. No thrombus is seen in the left atrium or left atrial appendage.  14. This TEE was followed by a successful cardioversion.   Epic records are reviewed at length today  CHA2DS2-VASc Score = 4 The patient's score is based upon: CHF History: No HTN History: Yes Age : 47-74 Diabetes History: Yes Stroke History: No Vascular Disease History: No Gender: Female      ASSESSMENT AND PLAN: 1. Paroxysmal Atrial Fibrillation/typical atrial flutter The patient's CHA2DS2-VASc score is 4, indicating a 4.8% annual risk of stroke.   We discussed therapeutic options today including ADD therapy and DCCV.  Will plan to start flecainide 50 mg BID 3 days prior to DCCV. Will arrange DCCV. Check Bmet/CBC today. Continue diltiazem 240 mg daily. Will add 120 mg PM dosing for rate control. D/C dilt 120 mg PM the night prior to DCCV. Continue Eliquis 5 mg BID. Patient denies any missed doses in the last 3 weeks.  2. Secondary Hypercoagulable State (ICD10:  D68.69) The patient is at significant risk for stroke/thromboembolism based upon her CHA2DS2-VASc Score of 4.  Continue Apixaban (Eliquis).   3. Obesity Body mass index is 38.14 kg/m. Lifestyle modification was discussed at length including regular exercise and weight reduction.  4. OSA The importance of adequate treatment of sleep apnea was discussed today in order to improve our ability to maintain sinus rhythm long term. Patient reports compliance with BiPap therapy.  5. HTN Stable, med changes as above.   Follow up in the AF clinic one week post DCCV.   [facility] [address] [place], [place] [address] [phone] 08/29/2019 3:48 PM

## 2019-09-02 ENCOUNTER — Other Ambulatory Visit (HOSPITAL_COMMUNITY): Payer: Self-pay | Admitting: *Deleted

## 2019-09-02 MED ORDER — METOPROLOL TARTRATE 25 MG PO TABS: 25.0000 mg | ORAL_TABLET | Freq: Two times a day (BID) | ORAL | 0 refills | Status: DC

## 2019-09-04 ENCOUNTER — Encounter: Payer: Self-pay | Admitting: Primary Care

## 2019-09-04 ENCOUNTER — Telehealth (INDEPENDENT_AMBULATORY_CARE_PROVIDER_SITE_OTHER): Payer: Medicare Other | Admitting: Primary Care

## 2019-09-04 ENCOUNTER — Other Ambulatory Visit: Payer: Self-pay

## 2019-09-04 ENCOUNTER — Telehealth: Payer: Self-pay | Admitting: Pulmonary Disease

## 2019-09-04 ENCOUNTER — Telehealth: Payer: Medicare Other | Admitting: General Practice

## 2019-09-04 DIAGNOSIS — I4891 Unspecified atrial fibrillation: Secondary | ICD-10-CM | POA: Diagnosis not present

## 2019-09-04 DIAGNOSIS — G4733 Obstructive sleep apnea (adult) (pediatric): Secondary | ICD-10-CM

## 2019-09-04 NOTE — Telephone Encounter (Signed)
I called and spoke with Tiffany at [facility] about this and she states that she does not have record of the patient and they do not have anything on file.  I am forwarding this to [facility] as she is the one that requested this.

## 2019-09-04 NOTE — Patient Instructions (Addendum)
  OSA - No changes to pressure setting - Needs to be enrolled in [place] for bipap downloads with [employer]  - Recommend trying full face mask - Do not drive if experiencing excessive daytime fatigue or somnolence - Do not take sedation medication or drink alcohol in excess prior to bedtime without wearing BIPAP as these can worsen sleep apnea   Follow-up 6 months with Dr. Halford Chessman or sooner if having any issues

## 2019-09-04 NOTE — H&P (View-Only) (Signed)
Virtual Visit via Video Note  I connected with [name] on 09/04/19 at  1:30 PM EST by a video enabled telemedicine application and verified that I am speaking with the correct person using two identifiers.  Location: Patient: Home Provider: Office   I discussed the limitations of evaluation and management by telemedicine and the availability of in person appointments. The patient expressed understanding and agreed to proceed.  History of Present Illness: 69 year old female, never smoked. PMH significant for OSA, chronic respiratory failure secondary to sleep related hypoxia, fibromyalgia, GERD, HTN, HLD, hypothyroidism, depression, migraine headaches, DM, Afib, vitamin B12 deficiency. Patient of Dr. Halford Chessman, last seen on 06/10/19 for video visit.   HST 05/28/19 >> AHI 26.9, SpO2 low 64%. Arranged for in-lab CPAP titration study to determine if she needs BIPAP +/- supplemental oxygen. Continues 2L nocturnal oxygen at this time pending titration study. Bipap 06/26/19 >> Bipap 17/13 cm H2O. She didn't need supplemental oxygen once she was on appropriate Bipap settings. DME company is [employer].    09/04/2019 Patient contacted today for video visit for BIPAP follow-up. She is doing "pretty well overall", reports compliance with BIPAP. She has been using nasal mask but reports occasionally breathing through her mouth. Some nights she has a lot of events and air leaks but states that it varies. States that she has been sleeping well at night and has not been not waking up as frequently. Occassionally needs to take naps. Epworth 5.   Observations/Objective:  - Appears well, no respiratory distress - Able to speak in full sentences  Assessment and Plan:  OSA - Patient reports compliance with Bipap; she has been having some events and airleaks - Pressure set at 17/13, no changes to pressure setting today  - Needs to be enrolled in [place] with [employer] - Recommend trying full face mask; check  download in 1 month - Do not drive if experiencing excessive daytime fatigue or somnolence - Do not take sedation medication or drink alcohol in excess prior to bedtime without wearing BIPAP as these can worsen sleep apnea   Afib - Currently in afib, HR 105-140 - Medications have recently been adjusted - Planning cardioversion on Monday March 15th    Follow Up Instructions:  Download in 1 month; Office visit in 6 months with Dr. Halford Chessman   I discussed the assessment and treatment plan with the patient. The patient was provided an opportunity to ask questions and all were answered. The patient agreed with the plan and demonstrated an understanding of the instructions.   The patient was advised to call back or seek an in-person evaluation if the symptoms worsen or if the condition fails to improve as anticipated.  I provided 18 minutes of non-face-to-face time during this encounter.   Martyn Ehrich, NP

## 2019-09-04 NOTE — Progress Notes (Signed)
Virtual Visit via Video Note  I connected with [name] on 09/04/19 at  1:30 PM EST by a video enabled telemedicine application and verified that I am speaking with the correct person using two identifiers.  Location: Patient: Home Provider: Office   I discussed the limitations of evaluation and management by telemedicine and the availability of in person appointments. The patient expressed understanding and agreed to proceed.  History of Present Illness: 69 year old female, never smoked. PMH significant for OSA, chronic respiratory failure secondary to sleep related hypoxia, fibromyalgia, GERD, HTN, HLD, hypothyroidism, depression, migraine headaches, DM, Afib, vitamin B12 deficiency. Patient of Dr. Halford Chessman, last seen on 06/10/19 for video visit.   HST 05/28/19 >> AHI 26.9, SpO2 low 64%. Arranged for in-lab CPAP titration study to determine if she needs BIPAP +/- supplemental oxygen. Continues 2L nocturnal oxygen at this time pending titration study. Bipap 06/26/19 >> Bipap 17/13 cm H2O. She didn't need supplemental oxygen once she was on appropriate Bipap settings. DME company is [employer].    09/04/2019 Patient contacted today for video visit for BIPAP follow-up. She is doing "pretty well overall", reports compliance with BIPAP. She has been using nasal mask but reports occasionally breathing through her mouth. Some nights she has a lot of events and air leaks but states that it varies. States that she has been sleeping well at night and has not been not waking up as frequently. Occassionally needs to take naps. Epworth 5.   Observations/Objective:  - Appears well, no respiratory distress - Able to speak in full sentences  Assessment and Plan:  OSA - Patient reports compliance with Bipap; she has been having some events and airleaks - Pressure set at 17/13, no changes to pressure setting today  - Needs to be enrolled in [place] with [employer] - Recommend trying full face mask; check  download in 1 month - Do not drive if experiencing excessive daytime fatigue or somnolence - Do not take sedation medication or drink alcohol in excess prior to bedtime without wearing BIPAP as these can worsen sleep apnea   Afib - Currently in afib, HR 105-140 - Medications have recently been adjusted - Planning cardioversion on Monday March 15th    Follow Up Instructions:  Download in 1 month; Office visit in 6 months with Dr. Halford Chessman   I discussed the assessment and treatment plan with the patient. The patient was provided an opportunity to ask questions and all were answered. The patient agreed with the plan and demonstrated an understanding of the instructions.   The patient was advised to call back or seek an in-person evaluation if the symptoms worsen or if the condition fails to improve as anticipated.  I provided 18 minutes of non-face-to-face time during this encounter.   Martyn Ehrich, NP

## 2019-09-05 ENCOUNTER — Other Ambulatory Visit: Payer: Self-pay

## 2019-09-05 ENCOUNTER — Other Ambulatory Visit
Admission: RE | Admit: 2019-09-05 | Discharge: 2019-09-05 | Disposition: A | Discharge status: Home / Self Care | Payer: Medicare Other | Source: Ambulatory Visit | Attending: Cardiology | Admitting: Cardiology

## 2019-09-05 DIAGNOSIS — Z01812 Encounter for preprocedural laboratory examination: Secondary | ICD-10-CM | POA: Insufficient documentation

## 2019-09-05 DIAGNOSIS — Z20822 Contact with and (suspected) exposure to covid-19: Secondary | ICD-10-CM | POA: Diagnosis not present

## 2019-09-05 NOTE — Telephone Encounter (Signed)
Spoke with [facility] regarding patient's BIPAP download. They are going to fax our office the download. Nothing else further needed.

## 2019-09-05 NOTE — Progress Notes (Signed)
Reviewed and agree with assessment/plan.   Belinda Schlichting, MD [facility] 06/22/2016, 12:24 PM Pager:  [phone]  

## 2019-09-06 ENCOUNTER — Telehealth: Payer: Medicare Other

## 2019-09-06 LAB — SARS CORONAVIRUS 2 (TAT 6-24 HRS): SARS Coronavirus 2: NEGATIVE

## 2019-09-08 ENCOUNTER — Encounter: Payer: Self-pay | Admitting: Family Medicine

## 2019-09-09 ENCOUNTER — Ambulatory Visit (HOSPITAL_COMMUNITY)
Admission: RE | Admit: 2019-09-09 | Discharge: 2019-09-09 | Disposition: A | Discharge status: Home / Self Care | Payer: Medicare Other | Attending: Cardiology | Admitting: Cardiology

## 2019-09-09 ENCOUNTER — Encounter (HOSPITAL_COMMUNITY)
Admission: RE | Disposition: A | Discharge status: Home / Self Care | Payer: Self-pay | Source: Home / Self Care | Attending: Cardiology

## 2019-09-09 DIAGNOSIS — F329 Major depressive disorder, single episode, unspecified: Secondary | ICD-10-CM | POA: Insufficient documentation

## 2019-09-09 DIAGNOSIS — E538 Deficiency of other specified B group vitamins: Secondary | ICD-10-CM | POA: Diagnosis not present

## 2019-09-09 DIAGNOSIS — G4733 Obstructive sleep apnea (adult) (pediatric): Secondary | ICD-10-CM | POA: Diagnosis not present

## 2019-09-09 DIAGNOSIS — I10 Essential (primary) hypertension: Secondary | ICD-10-CM | POA: Diagnosis not present

## 2019-09-09 DIAGNOSIS — M797 Fibromyalgia: Secondary | ICD-10-CM | POA: Insufficient documentation

## 2019-09-09 DIAGNOSIS — K219 Gastro-esophageal reflux disease without esophagitis: Secondary | ICD-10-CM | POA: Insufficient documentation

## 2019-09-09 DIAGNOSIS — J9610 Chronic respiratory failure, unspecified whether with hypoxia or hypercapnia: Secondary | ICD-10-CM | POA: Diagnosis not present

## 2019-09-09 DIAGNOSIS — E119 Type 2 diabetes mellitus without complications: Secondary | ICD-10-CM | POA: Diagnosis not present

## 2019-09-09 DIAGNOSIS — E785 Hyperlipidemia, unspecified: Secondary | ICD-10-CM | POA: Diagnosis not present

## 2019-09-09 DIAGNOSIS — I4891 Unspecified atrial fibrillation: Secondary | ICD-10-CM | POA: Diagnosis not present

## 2019-09-09 DIAGNOSIS — E039 Hypothyroidism, unspecified: Secondary | ICD-10-CM | POA: Insufficient documentation

## 2019-09-09 SURGERY — CANCELLED PROCEDURE

## 2019-09-09 NOTE — CV Procedure (Signed)
Cardioversion was cancelled, the patient was in SR with HR 67 BPM on arrival.  [name], MD 09/09/2019

## 2019-09-09 NOTE — Interval H&P Note (Signed)
History and Physical Interval Note:  09/09/2019 10:15 AM  [name]  has presented today for surgery, with the diagnosis of AFIB.  The various methods of treatment have been discussed with the patient and family. After consideration of risks, benefits and other options for treatment, the patient has consented to  Procedure(s): CARDIOVERSION (N/A) as a surgical intervention.  The patient's history has been reviewed, patient examined, no change in status, stable for surgery.  I have reviewed the patient's chart and labs.  Questions were answered to the patient's satisfaction.     [name]

## 2019-09-11 ENCOUNTER — Ambulatory Visit: Payer: Medicare Other | Admitting: Registered Nurse

## 2019-09-11 ENCOUNTER — Encounter: Payer: Medicare Other | Attending: Physical Medicine & Rehabilitation | Admitting: Registered Nurse

## 2019-09-11 ENCOUNTER — Other Ambulatory Visit: Payer: Self-pay

## 2019-09-11 ENCOUNTER — Encounter: Payer: Self-pay | Admitting: Registered Nurse

## 2019-09-11 VITALS — BP 136/81 | HR 76 | Ht 65.0 in | Wt 228.0 lb

## 2019-09-11 DIAGNOSIS — Z79891 Long term (current) use of opiate analgesic: Secondary | ICD-10-CM | POA: Insufficient documentation

## 2019-09-11 DIAGNOSIS — G8929 Other chronic pain: Secondary | ICD-10-CM

## 2019-09-11 DIAGNOSIS — Z5181 Encounter for therapeutic drug level monitoring: Secondary | ICD-10-CM | POA: Diagnosis not present

## 2019-09-11 DIAGNOSIS — Z79899 Other long term (current) drug therapy: Secondary | ICD-10-CM | POA: Insufficient documentation

## 2019-09-11 DIAGNOSIS — M546 Pain in thoracic spine: Secondary | ICD-10-CM

## 2019-09-11 DIAGNOSIS — M961 Postlaminectomy syndrome, not elsewhere classified: Secondary | ICD-10-CM | POA: Diagnosis not present

## 2019-09-11 DIAGNOSIS — M25562 Pain in left knee: Secondary | ICD-10-CM

## 2019-09-11 DIAGNOSIS — G894 Chronic pain syndrome: Secondary | ICD-10-CM | POA: Insufficient documentation

## 2019-09-11 MED ORDER — MORPHINE SULFATE ER 15 MG PO TBCR: 15.0000 mg | EXTENDED_RELEASE_TABLET | Freq: Three times a day (TID) | ORAL | 0 refills | Status: DC

## 2019-09-11 NOTE — Progress Notes (Signed)
Subjective:    Patient ID: [name], female    DOB: January 01, 1951, 69 y.o.   MRN: [id number]  HPI: [name] is a 69 y.o. female whose appointment was changed to a virtual office visit to reduce the risk of exposure to the COVID-19 virus and to help [name] remain healthy and safe. The virtual visit will also provide continuity of care. [name] agrees with virtual visit and verbalizes understanding. She  States her pain is located in her mid- back and left knee pain. She rates her pain 4. Her current exercise regime is walking and performing stretching exercises.   [name] Morphine equivalent is 45.00 MME.  Last UDS was Performed on 05/15/2019, it was consistent.   Alicia Mcalpine CNA asked The Health and History Questions, this provider and [name] verified we were speaking with the correct person using two identifiers.   Pain Inventory Average Pain 6 Pain Right Now 4 My pain is burning and aching  In the last 24 hours, has pain interfered with the following? General activity 4 Relation with others 5 Enjoyment of life 5 What TIME of day is your pain at its worst? evening Sleep (in general) Good  Pain is worse with: walking, standing and some activites Pain improves with: rest and medication Relief from Meds: 6  Mobility walk without assistance ability to climb steps?  yes do you drive?  yes  Function retired I need assistance with the following:  meal prep, household duties and shopping  Neuro/Psych bladder control problems weakness numbness tremor tingling trouble walking spasms depression  Prior Studies Any changes since last visit?  no  Physicians involved in your care Any changes since last visit?  no   Family History  Problem Relation Age of Onset  . Breast cancer Mother   . Diabetes Mother   . Heart disease Mother   . Allergies Mother   . Diabetes Father   . Heart disease Father   . Emphysema Father   . Allergies Father   .  Stomach cancer Maternal Uncle   . Breast cancer Paternal Aunt   . Colon cancer Neg Hx    Social History   Socioeconomic History  . Marital status: Married    Spouse name: Not on file  . Number of children: Not on file  . Years of education: Not on file  . Highest education level: Not on file  Occupational History  . Occupation: Radiation protection practitioner  . Occupation: retired  Tobacco Use  . Smoking status: Never Smoker  . Smokeless tobacco: Never Used  Substance and Sexual Activity  . Alcohol use: Yes    Alcohol/week: 1.0 standard drinks    Types: 1 Glasses of wine per week    Comment: rare  . Drug use: Never  . Sexual activity: Not Currently  Other Topics Concern  . Not on file  Social History Narrative  . Not on file   Social Determinants of Health   Financial Resource Strain: Low Risk   . Difficulty of Paying Living Expenses: Not hard at all  Food Insecurity: No Food Insecurity  . Worried About Charity fundraiser in the Last Year: Never true  . Ran Out of Food in the Last Year: Never true  Transportation Needs: No Transportation Needs  . Lack of Transportation (Medical): No  . Lack of Transportation (Non-Medical): No  Physical Activity: Inactive  . Days of Exercise per Week: 0 days  . Minutes of Exercise per  Session: 0 min  Stress: No Stress Concern Present  . Feeling of Stress : Not at all  Social Connections:   . Frequency of Communication with Friends and Family:   . Frequency of Social Gatherings with Friends and Family:   . Attends Religious Services:   . Active Member of Clubs or Organizations:   . Attends Archivist Meetings:   Alicia Kitchen Marital Status:    Past Surgical History:  Procedure Laterality Date  . [place] STUDY N/A 01/04/2016   Procedure: [place] STUDY;  Surgeon: Manus Gunning, MD;  Location: [facility];  Service: Gastroenterology;  Laterality: N/A;  . ABDOMINAL HYSTERECTOMY  1988  . ADENOIDECTOMY    . ANTERIOR CERVICAL  DECOMP/DISCECTOMY FUSION  2009, 1993   first procedure in ~ 1993/Dr Botero  . APPENDECTOMY  1988  . BACK SURGERY    . CARDIOVERSION N/A 04/03/2019   Procedure: CARDIOVERSION;  Surgeon: Dorothy Spark, MD;  Location: [facility] ENDOSCOPY;  Service: Cardiovascular;  Laterality: N/A;  . CHOLECYSTECTOMY  2005  . ESOPHAGEAL MANOMETRY N/A 01/04/2016   Procedure: ESOPHAGEAL MANOMETRY (EM) 24 HR PH;  Surgeon: Manus Gunning, MD;  Location: [facility];  Service: Gastroenterology;  Laterality: N/A;  . ESOPHAGOGASTRODUODENOSCOPY (EGD) WITH PROPOFOL N/A 05/17/2016   Procedure: ESOPHAGOGASTRODUODENOSCOPY (EGD) WITH PROPOFOL;  Surgeon: Manus Gunning, MD;  Location: [facility];  Service: Gastroenterology;  Laterality: N/A;  . NASAL SINUS SURGERY  2006  . SAVORY DILATION N/A 05/17/2016   Procedure: SAVORY DILATION;  Surgeon: Manus Gunning, MD;  Location: [facility];  Service: Gastroenterology;  Laterality: N/A;  . TEE WITHOUT CARDIOVERSION N/A 04/03/2019   Procedure: TRANSESOPHAGEAL ECHOCARDIOGRAM (TEE)-with DCCV;  Surgeon: Dorothy Spark, MD;  Location: [facility] ENDOSCOPY;; EF 55 to 60%.  No LVH.  Normal RV.  Moderate LA dilation.  No thrombus.  Mild RA dilation.  Mild to moderate TR.  Mild aortic valve sclerosis, no stenosis.  Moderately elevated PA pressures (47 mmHg)  . TONSILLECTOMY AND ADENOIDECTOMY    . TRANSTHORACIC ECHOCARDIOGRAM  2009   Normal LV size and function. EF 60-65%. No regional wall motion and amount is. Mild aortic sclerosis, no stenosis  . TUBAL LIGATION  1979   Past Medical History:  Diagnosis Date  . Allergy   . Anemia   . Arthritis   . B12 deficiency   . Complication of anesthesia    slow to wake up   . Depression   . Diabetes mellitus type 2, diet-controlled ([place]) 07/24/2018  . Fibromyalgia   . Generalized hyperhidrosis 04/17/2014  . GERD (gastroesophageal reflux disease)   . Hyperlipemia   . HYPOTHYROIDISM 05/29/2007  . Irritable bowel syndrome   .  Migraines    hx of   . OSA (obstructive sleep apnea) 06/26/2019  . Oxygen deficiency    2 L at bedtime  . PVC (premature ventricular contraction)   . Senile calcific aortic valve sclerosis    Aortic Sclerosis-no stenosis  . Unspecified essential hypertension    denies htn on 05/09/16   There were no vitals taken for this visit.  Opioid Risk Score:   Fall Risk Score:  `1  Depression screen PHQ 2/9  Depression screen [facility] 2/9 06/19/2019 01/18/2019 08/08/2018 04/24/2018 01/10/2018 11/24/2017 03/27/2017  Decreased Interest 1 0 0 0 2 1 0  Down, Depressed, Hopeless 1 0 0 0 0 1 0  PHQ - 2 Score 2 0 0 0 2 2 0  Altered sleeping - 0 - -  0 - -  Tired, decreased energy - 3 - - 1 - -  Change in appetite - 0 - - 0 - -  Feeling bad or failure about yourself  - 0 - - 0 - -  Trouble concentrating - 0 - - 1 - -  Moving slowly or fidgety/restless - 0 - - 0 - -  Suicidal thoughts - 0 - - 0 - -  PHQ-9 Score - 3 - - 4 - -  Difficult doing work/chores - Not difficult at all - - Not difficult at all Not difficult at all -  Some recent data might be hidden     Review of Systems  Constitutional: Negative.   HENT: Negative.   Eyes: Negative.   Respiratory: Negative.   Cardiovascular: Positive for leg swelling.  Gastrointestinal: Negative.   Endocrine: Negative.   Genitourinary: Positive for difficulty urinating.  Musculoskeletal: Positive for arthralgias, back pain, gait problem, joint swelling and myalgias.  Skin: Negative.   Allergic/Immunologic: Negative.   Neurological: Positive for tremors, weakness and numbness.  Hematological: Negative.   Psychiatric/Behavioral: Positive for dysphoric mood.  All other systems reviewed and are negative.      Objective:   Physical Exam Vitals and nursing note reviewed.  Musculoskeletal:     Comments: No Physical Exam Virtual Visit           Assessment & Plan:  1. Cervical postlaminectomy syndrome:/ Cervicalgia/ Cervical Radiculitis/with neck  and shoulder pain/ Chronic Midline Thoracic Pain.09/11/2019.Continue exercise routine and using heat therapy.Refilled:current medication regimen withMS Contin 15 mg one tablet three times a day #90.  We will continue the opioid monitoring program, this consists of regular clinic visits, examinations, urine drug screen, pill counts as well as use of [place] Controlled Substance reporting System. 2. Fibromyalgia/ Neuropathy: Continuecurrent medication regimen withGabapentin, activity and exercise regime.09/11/2019. 3.Left Knee Pain/BilateralKnee Pain/ Mild Degenerative Changes:.Continuecurrent medication regimen withVoltaren Gel.09/11/2019. 4.BilateralAnkle Pain:No complaints today.Continue HEP as tolerated. Continueto monitor.09/11/2019. 5. Muscle Spasm: Continuecurrent medication regimen withRobaxin.09/11/2019.. 6. Opioid Induced Constipation: No complaints Continue to Monitor.09/11/2019. 7. Midline Low Back Pain: Continue current medication regime, continue HEP as tolerated.09/11/2019. 8. Right Gluteal Medius Weakness and Atrophy: No complaints Today:Continue with Hip Abduction Exercisesas tolerated: Continue to Monitor.09/11/2019. 9. Right Greater Trochanter Bursitis:No Complaints Today.Continue to Alternate Ice and Heat Therapy. Continue to Monitor.09/11/2019 10. Sacroiliac Joint Pain:No complaints today.S/PSacroiliac Injection on 08/24/2018 with good relief noted.09/11/2019 11.Lumbar Radiculitis: Continue Gabapentin.Continue HEP as tolerated. Continue current medication regimen. Continue to monitor.09/11/2019.  F/U in 1 month Virtual Visit Established Patient Location of Patient : In her home Location of Provider: In The Office Total Time Spent: 10 Minutes

## 2019-09-16 ENCOUNTER — Other Ambulatory Visit: Payer: Self-pay

## 2019-09-16 ENCOUNTER — Ambulatory Visit (HOSPITAL_COMMUNITY)
Admission: RE | Admit: 2019-09-16 | Discharge: 2019-09-16 | Disposition: A | Discharge status: Home / Self Care | Payer: Medicare Other | Source: Ambulatory Visit | Attending: Physician Assistant | Admitting: Physician Assistant

## 2019-09-16 ENCOUNTER — Encounter (HOSPITAL_COMMUNITY): Payer: Self-pay | Admitting: Physician Assistant

## 2019-09-16 VITALS — BP 130/64 | HR 59 | Ht 65.0 in | Wt 231.6 lb

## 2019-09-16 DIAGNOSIS — Z7901 Long term (current) use of anticoagulants: Secondary | ICD-10-CM | POA: Insufficient documentation

## 2019-09-16 DIAGNOSIS — E785 Hyperlipidemia, unspecified: Secondary | ICD-10-CM | POA: Insufficient documentation

## 2019-09-16 DIAGNOSIS — M797 Fibromyalgia: Secondary | ICD-10-CM | POA: Insufficient documentation

## 2019-09-16 DIAGNOSIS — F329 Major depressive disorder, single episode, unspecified: Secondary | ICD-10-CM | POA: Diagnosis not present

## 2019-09-16 DIAGNOSIS — I4892 Unspecified atrial flutter: Secondary | ICD-10-CM | POA: Insufficient documentation

## 2019-09-16 DIAGNOSIS — I493 Ventricular premature depolarization: Secondary | ICD-10-CM | POA: Insufficient documentation

## 2019-09-16 DIAGNOSIS — E118 Type 2 diabetes mellitus with unspecified complications: Secondary | ICD-10-CM | POA: Diagnosis not present

## 2019-09-16 DIAGNOSIS — K589 Irritable bowel syndrome without diarrhea: Secondary | ICD-10-CM | POA: Diagnosis not present

## 2019-09-16 DIAGNOSIS — Z8249 Family history of ischemic heart disease and other diseases of the circulatory system: Secondary | ICD-10-CM | POA: Diagnosis not present

## 2019-09-16 DIAGNOSIS — Z6838 Body mass index (BMI) 38.0-38.9, adult: Secondary | ICD-10-CM | POA: Insufficient documentation

## 2019-09-16 DIAGNOSIS — Z79899 Other long term (current) drug therapy: Secondary | ICD-10-CM | POA: Insufficient documentation

## 2019-09-16 DIAGNOSIS — I480 Paroxysmal atrial fibrillation: Secondary | ICD-10-CM | POA: Insufficient documentation

## 2019-09-16 DIAGNOSIS — D6869 Other thrombophilia: Secondary | ICD-10-CM | POA: Diagnosis not present

## 2019-09-16 DIAGNOSIS — E669 Obesity, unspecified: Secondary | ICD-10-CM | POA: Diagnosis not present

## 2019-09-16 DIAGNOSIS — I10 Essential (primary) hypertension: Secondary | ICD-10-CM | POA: Insufficient documentation

## 2019-09-16 DIAGNOSIS — E039 Hypothyroidism, unspecified: Secondary | ICD-10-CM | POA: Insufficient documentation

## 2019-09-16 DIAGNOSIS — K219 Gastro-esophageal reflux disease without esophagitis: Secondary | ICD-10-CM | POA: Insufficient documentation

## 2019-09-16 DIAGNOSIS — D649 Anemia, unspecified: Secondary | ICD-10-CM | POA: Insufficient documentation

## 2019-09-16 DIAGNOSIS — G4733 Obstructive sleep apnea (adult) (pediatric): Secondary | ICD-10-CM | POA: Insufficient documentation

## 2019-09-16 MED ORDER — FLECAINIDE ACETATE 50 MG PO TABS: 50.0000 mg | ORAL_TABLET | Freq: Two times a day (BID) | ORAL | 1 refills | Status: DC

## 2019-09-16 NOTE — Progress Notes (Signed)
Primary Care Physician: Owens Loffler, MD Primary Cardiologist: Dr Ellyn Hack Primary Electrophysiologist: none Referring Physician: Dr Bary Leriche is a 69 y.o. female with a history of fibromyalgia, DM, atrial flutter, HTN, OSA, paroxysmal atrial fibrillation who presents for follow up in the [facility].  The patient was initially diagnosed with atrial fibrillation 04/01/19 after presenting with one week of fatigue and weakness. She underwent TEE/DCCV on 04/03/19. Patient is on Eliquis for a CHADS2VASC score of 4. She reports that she has done well since then until the night of 08/28/19 when she noted heart racing symptoms with some lightheaded. When she got up the next morning and check her home ECG device she was in afib with RVR. There were no specific triggers that the patient could identify however, she did have a COVID vaccine in the last two weeks. She is on BiPap for OSA. She denies any significant alcohol use.   On follow up today, her DCCV was cancelled as she presented in [place]. She reports that she feels much better in SR with more energy, less SOB, no heart racing or dizziness. She is tolerating the flecainide without difficulty.   Today, she denies symptoms of palpitations, chest pain, shortness of breath, orthopnea, PND, lower extremity edema, presyncope, syncope, bleeding, or neurologic sequela. The patient is tolerating medications without difficulties and is otherwise without complaint today.    Atrial Fibrillation Risk Factors:  she does have symptoms or diagnosis of sleep apnea. she is compliant with BiPAP therapy. she does not have a history of rheumatic fever. she does not have a history of alcohol use. The patient does not have a history of early familial atrial fibrillation or other arrhythmias.  she has a BMI of Body mass index is 38.54 kg/m.Marland Kitchen Filed Weights   09/16/19 1143  Weight: 105.1 kg    Family History  Problem  Relation Age of Onset  . Breast cancer Mother   . Diabetes Mother   . Heart disease Mother   . Allergies Mother   . Diabetes Father   . Heart disease Father   . Emphysema Father   . Allergies Father   . Stomach cancer Maternal Uncle   . Breast cancer Paternal Aunt   . Colon cancer Neg Hx      Atrial Fibrillation Management history:  Previous antiarrhythmic drugs: flecainide Previous cardioversions: 04/03/19 Previous ablations: none CHADS2VASC score: 4 Anticoagulation history: Eliquis   Past Medical History:  Diagnosis Date  . Allergy   . Anemia   . Arthritis   . B12 deficiency   . Complication of anesthesia    slow to wake up   . Depression   . Diabetes mellitus type 2, diet-controlled ([place]) 07/24/2018  . Fibromyalgia   . Generalized hyperhidrosis 04/17/2014  . GERD (gastroesophageal reflux disease)   . Hyperlipemia   . HYPOTHYROIDISM 05/29/2007  . Irritable bowel syndrome   . Migraines    hx of   . OSA (obstructive sleep apnea) 06/26/2019  . Oxygen deficiency    2 L at bedtime  . PVC (premature ventricular contraction)   . Senile calcific aortic valve sclerosis    Aortic Sclerosis-no stenosis  . Unspecified essential hypertension    denies htn on 05/09/16   Past Surgical History:  Procedure Laterality Date  . [place] STUDY N/A 01/04/2016   Procedure: [place] STUDY;  Surgeon: Manus Gunning, MD;  Location: [facility] ENDOSCOPY;  Service: Gastroenterology;  Laterality:  N/A;  . ABDOMINAL HYSTERECTOMY  1988  . ADENOIDECTOMY    . ANTERIOR CERVICAL DECOMP/DISCECTOMY FUSION  2009, 1993   first procedure in ~ 1993/Dr Botero  . APPENDECTOMY  1988  . BACK SURGERY    . CARDIOVERSION N/A 04/03/2019   Procedure: CARDIOVERSION;  Surgeon: Dorothy Spark, MD;  Location: [facility] ENDOSCOPY;  Service: Cardiovascular;  Laterality: N/A;  . CHOLECYSTECTOMY  2005  . ESOPHAGEAL MANOMETRY N/A 01/04/2016   Procedure: ESOPHAGEAL MANOMETRY (EM) 24 HR PH;  Surgeon: Manus Gunning, MD;  Location: [facility] ENDOSCOPY;  Service: Gastroenterology;  Laterality: N/A;  . ESOPHAGOGASTRODUODENOSCOPY (EGD) WITH PROPOFOL N/A 05/17/2016   Procedure: ESOPHAGOGASTRODUODENOSCOPY (EGD) WITH PROPOFOL;  Surgeon: Manus Gunning, MD;  Location: [facility] ENDOSCOPY;  Service: Gastroenterology;  Laterality: N/A;  . NASAL SINUS SURGERY  2006  . SAVORY DILATION N/A 05/17/2016   Procedure: SAVORY DILATION;  Surgeon: Manus Gunning, MD;  Location: [facility];  Service: Gastroenterology;  Laterality: N/A;  . TEE WITHOUT CARDIOVERSION N/A 04/03/2019   Procedure: TRANSESOPHAGEAL ECHOCARDIOGRAM (TEE)-with DCCV;  Surgeon: Dorothy Spark, MD;  Location: [facility] ENDOSCOPY;; EF 55 to 60%.  No LVH.  Normal RV.  Moderate LA dilation.  No thrombus.  Mild RA dilation.  Mild to moderate TR.  Mild aortic valve sclerosis, no stenosis.  Moderately elevated PA pressures (47 mmHg)  . TONSILLECTOMY AND ADENOIDECTOMY    . TRANSTHORACIC ECHOCARDIOGRAM  2009   Normal LV size and function. EF 60-65%. No regional wall motion and amount is. Mild aortic sclerosis, no stenosis  . TUBAL LIGATION  1979    Current Outpatient Medications  Medication Sig Dispense Refill  . acetaminophen (TYLENOL) 650 MG CR tablet Take 650 mg by mouth in the morning and at bedtime.     Marland Kitchen apixaban (ELIQUIS) 5 MG TABS tablet Take 1 tablet (5 mg total) by mouth 2 (two) times daily. 180 tablet 1  . B Complex Vitamins (B COMPLEX 100 PO) Take 1 tablet by mouth every morning.     . bisacodyl (DULCOLAX) 5 MG EC tablet Take 10 mg by mouth at bedtime.     . calcium carbonate (TUMS EX) 750 MG chewable tablet Chew 1-2 tablets by mouth 2 (two) times daily as needed for heartburn (acid reflux).    . Calcium Carbonate-Vitamin D (CALCIUM 600+D PO) Take 1 tablet by mouth in the morning and at bedtime.     . carboxymethylcellulose (REFRESH PLUS) 0.5 % SOLN Place 1 drop into both eyes 3 (three) times daily as needed (dry eyes).     . cetirizine  (ZYRTEC) 10 MG tablet Take 10 mg by mouth at bedtime.     . Cholecalciferol (VITAMIN D) 125 MCG (5000 UT) CAPS Take 5,000 Units by mouth every morning.     . citalopram (CELEXA) 40 MG tablet Take 1 tablet (40 mg total) by mouth daily. (Patient taking differently: Take 20 mg by mouth daily. )    . clindamycin (CLEOCIN T) 1 % lotion Apply 1 application topically daily as needed (eczema).     . Coenzyme Q-10 100 MG capsule Take 100 mg by mouth every morning.     . diclofenac sodium (VOLTAREN) 1 % GEL Apply 4 g topically 4 (four) times daily as needed (joint pain).     Marland Kitchen diltiazem (CARDIZEM CD) 240 MG 24 hr capsule Take 1 capsule (240 mg total) by mouth daily. 90 capsule 1  . docusate sodium (COLACE) 250 MG capsule Take 250 mg by mouth at bedtime.    Marland Kitchen  flecainide (TAMBOCOR) 50 MG tablet Take 1 tablet (50 mg total) by mouth 2 (two) times daily. 180 tablet 1  . fluocinonide (LIDEX) 0.05 % external solution Apply 1 application topically daily as needed (eczema on scalp).     . gabapentin (NEURONTIN) 300 MG capsule TAKE 1 CAPSULE BY MOUTH 3  TIMES DAILY (Patient taking differently: Take 300 mg by mouth 3 (three) times daily. ) 270 capsule 3  . GRAPE SEED EXTRACT PO Take 100 mg by mouth 2 (two) times daily.     Marland Kitchen levothyroxine (SYNTHROID) 50 MCG tablet TAKE 1 TABLET BY MOUTH  DAILY (Patient taking differently: Take 50 mcg by mouth daily before breakfast. ) 90 tablet 3  . loratadine (CLARITIN) 10 MG tablet Take 10 mg by mouth every morning.     . Magnesium Bisglycinate (MAG GLYCINATE PO) Take 400 mg by mouth at bedtime.     . methocarbamol (ROBAXIN) 500 MG tablet TAKE ONE (1) TABLET BY MOUTH EVERY EIGHTHOURS AS NEEDED FOR MUSCLE SPASMS (Patient taking differently: Take 500 mg by mouth 2 (two) times daily as needed for muscle spasms. ) 270 tablet 3  . mirtazapine (REMERON) 15 MG tablet Take 1 tablet (15 mg total) by mouth at bedtime.    Marland Kitchen morphine (MS CONTIN) 15 MG 12 hr tablet Take 1 tablet (15 mg total) by  mouth 3 (three) times daily. 90 tablet 0  . nitroGLYCERIN (NITROSTAT) 0.4 MG SL tablet Place 1 tablet (0.4 mg total) under the tongue every 5 (five) minutes as needed for chest pain. 90 tablet 3  . oxybutynin (DITROPAN-XL) 10 MG 24 hr tablet TAKE 1 TABLET BY MOUTH AT  BEDTIME (Patient taking differently: Take 10 mg by mouth at bedtime. ) 90 tablet 1  . oxymetazoline (AFRIN) 0.05 % nasal spray Place 1 spray into both nostrils 2 (two) times daily as needed for congestion.    . RABEprazole (ACIPHEX) 20 MG tablet Take 1 tablet (20 mg total) by mouth daily. (Patient taking differently: Take 20 mg by mouth at bedtime. ) 90 tablet 3  . tretinoin (RETIN-A) 0.05 % cream Apply 1 application topically daily as needed (acne).     . vitamin B-12 (CYANOCOBALAMIN) 1000 MCG tablet Take 1,000 mcg by mouth daily.     No current facility-administered medications for this encounter.    Allergies  Allergen Reactions  . Cyclobenzaprine Hcl Anaphylaxis and Swelling    Reaction - throat closed, tongue swelling  . Rizatriptan Benzoate Anaphylaxis    Throat and Tongue Swelling Closed  . Sumatriptan Swelling    Throat closed & Tongue Swelling  . Tizanidine Shortness Of Breath  . Aleve [Naproxen Sodium] Hives  . Clarithromycin Hives  . Doxycycline Diarrhea and Nausea And Vomiting  . Duloxetine Nausea Only  . Egg [Eggs Or Egg-Derived Products] Diarrhea and Nausea And Vomiting  . Keflex [Cephalexin] Itching and Other (See Comments)    Sores  . Metronidazole Diarrhea and Nausea Only  . Oxycodone Hcl Diarrhea and Nausea And Vomiting  . Oxycontin [Oxycodone Hcl] Nausea And Vomiting  . Penicillins Other (See Comments)    Unknown reaction Did it involve swelling of the face/tongue/throat, SOB, or low BP? Unknown Did it involve sudden or severe rash/hives, skin peeling, or any reaction on the inside of your mouth or nose? Unknown Did you need to seek medical attention at a hospital or doctor's office? Unknown When  did it last happen?infant If all above answers are "NO", may proceed with cephalosporin use.  Marland Kitchen  Rosuvastatin Other (See Comments)    Muscle aches  . Venlafaxine Hives  . Voltaren [Diclofenac Sodium] Hives    Reaction to oral med, uses gel occasionally  . Septra [Sulfamethoxazole-Trimethoprim] Itching and Rash  . Tramadol Diarrhea, Nausea And Vomiting and Palpitations    Social History   Socioeconomic History  . Marital status: Married    Spouse name: Not on file  . Number of children: Not on file  . Years of education: Not on file  . Highest education level: Not on file  Occupational History  . Occupation: Radiation protection practitioner  . Occupation: retired  Tobacco Use  . Smoking status: Never Smoker  . Smokeless tobacco: Never Used  Substance and Sexual Activity  . Alcohol use: Yes    Alcohol/week: 1.0 standard drinks    Types: 1 Glasses of wine per week    Comment: rare  . Drug use: Never  . Sexual activity: Not Currently  Other Topics Concern  . Not on file  Social History Narrative  . Not on file   Social Determinants of Health   Financial Resource Strain: Low Risk   . Difficulty of Paying Living Expenses: Not hard at all  Food Insecurity: No Food Insecurity  . Worried About Charity fundraiser in the Last Year: Never true  . Ran Out of Food in the Last Year: Never true  Transportation Needs: No Transportation Needs  . Lack of Transportation (Medical): No  . Lack of Transportation (Non-Medical): No  Physical Activity: Inactive  . Days of Exercise per Week: 0 days  . Minutes of Exercise per Session: 0 min  Stress: No Stress Concern Present  . Feeling of Stress : Not at all  Social Connections:   . Frequency of Communication with Friends and Family:   . Frequency of Social Gatherings with Friends and Family:   . Attends Religious Services:   . Active Member of Clubs or Organizations:   . Attends Archivist Meetings:   Marland Kitchen Marital Status:   Intimate Partner  Violence: Not At Risk  . Fear of Current or Ex-Partner: No  . Emotionally Abused: No  . Physically Abused: No  . Sexually Abused: No     ROS- All systems are reviewed and negative except as per the HPI above.  Physical Exam: Vitals:   09/16/19 1143  BP: 130/64  Pulse: (!) 59  Weight: 105.1 kg  Height: 5\' 5"  (1.651 m)    GEN- The patient is well appearing obese female, alert and oriented x 3 today.   HEENT-head normocephalic, atraumatic, sclera clear, conjunctiva pink, hearing intact, trachea midline. Lungs- Clear to ausculation bilaterally, normal work of breathing Heart- Regular rate and rhythm, no murmurs, rubs or gallops  GI- soft, NT, ND, + BS Extremities- no clubbing, cyanosis. Non pitting edema MS- no significant deformity or atrophy Skin- no rash or lesion Psych- euthymic mood, full affect Neuro- strength and sensation are intact   Wt Readings from Last 3 Encounters:  09/16/19 105.1 kg  09/11/19 103.4 kg  08/29/19 104 kg    EKG today demonstrates SR HR 59, PR 160, QRS 92, QTc 384  TEE 04/03/19 demonstrated  1. Left ventricular ejection fraction, by visual estimation, is 55 to  60%. The left ventricle has normal function. Normal left ventricular size.  There is no left ventricular hypertrophy.  2. Global right ventricle has normal systolic function.The right  ventricular size is normal. No increase in right ventricular wall  thickness.  3. Left atrial  size was moderately dilated.  4. No thrombus is seen in the left atrium or left atrial appendage.  5. Right atrial size was mildly dilated.  6. The mitral valve is normal in structure. Mild mitral valve  regurgitation. No evidence of mitral stenosis.  7. The tricuspid valve is normal in structure. Tricuspid valve  regurgitation mild-moderate.  8. The aortic valve is normal in structure. Aortic valve regurgitation is  mild by color flow Doppler. Mild aortic valve sclerosis without stenosis.  9. The  pulmonic valve was normal in structure. Pulmonic valve  regurgitation is not visualized by color flow Doppler.  10. Moderately elevated pulmonary artery systolic pressure.  11. The tricuspid regurgitant velocity is 3.12 m/s, and with an assumed  right atrial pressure of 8 mmHg, the estimated right ventricular systolic  pressure is moderately elevated at 46.9 mmHg.  12. The inferior vena cava is normal in size with greater than 50%  respiratory variability, suggesting right atrial pressure of 3 mmHg.  13. No thrombus is seen in the left atrium or left atrial appendage.  14. This TEE was followed by a successful cardioversion.   Epic records are reviewed at length today  CHA2DS2-VASc Score = 4 The patient's score is based upon: CHF History: No HTN History: Yes Age : 61-74 Diabetes History: Yes Stroke History: No Vascular Disease History: No Gender: Female   ASSESSMENT AND PLAN: 1. Paroxysmal Atrial Fibrillation/typical atrial flutter The patient's CHA2DS2-VASc score is 4, indicating a 4.8% annual risk of stroke.   Patient converted to SR with flecainide, DCCV cancelled. Continue flecainide 50 mg BID. Will check TST. Continue diltiazem 240 mg daily.  Continue Eliquis 5 mg BID  2. Secondary Hypercoagulable State (ICD10:  D68.69) The patient is at significant risk for stroke/thromboembolism based upon her CHA2DS2-VASc Score of 4.  Continue Apixaban (Eliquis).   3. Obesity Body mass index is 38.54 kg/m. Lifestyle modification was discussed and encouraged including regular physical activity and weight reduction.  4. OSA The importance of adequate treatment of sleep apnea was discussed today in order to improve our ability to maintain sinus rhythm long term. Patient reports compliance with BiPap therapy.  5. HTN Stable, no changes today.   Follow up in the AF clinic in 3 months.    [facility] [address] [place], [place]  [address] [phone] 09/16/2019 12:01 PM

## 2019-09-16 NOTE — Patient Instructions (Signed)
Scheduling will contact you to setup treadmill stress testing.

## 2019-09-18 ENCOUNTER — Ambulatory Visit: Payer: Medicare Other | Attending: Internal Medicine

## 2019-09-18 DIAGNOSIS — Z23 Encounter for immunization: Secondary | ICD-10-CM

## 2019-09-18 NOTE — Progress Notes (Signed)
   Covid-19 Vaccination Clinic  Name:  [name]    MRN: [id number] DOB: 22-Jun-1951  09/18/2019  [name] was observed post Covid-19 immunization for 15 minutes without incident. She was provided with Vaccine Information Sheet and instruction to access the V-Safe system.   [name] was instructed to call 911 with any severe reactions post vaccine: Marland Kitchen Difficulty breathing  . Swelling of face and throat  . A fast heartbeat  . A bad rash all over body  . Dizziness and weakness   Immunizations Administered    Name Date Dose VIS Date Route   Pfizer COVID-19 Vaccine 09/18/2019  3:01 PM 0.3 mL 06/07/2019 Intramuscular   Manufacturer: [place]   Lot: [id number]   [place]: [id number]

## 2019-09-19 ENCOUNTER — Telehealth (HOSPITAL_COMMUNITY): Payer: Self-pay

## 2019-09-19 NOTE — Telephone Encounter (Signed)
Encounter complete. 

## 2019-09-21 ENCOUNTER — Other Ambulatory Visit (HOSPITAL_COMMUNITY)
Admission: RE | Admit: 2019-09-21 | Discharge: 2019-09-21 | Disposition: A | Discharge status: Home / Self Care | Payer: Medicare Other | Source: Ambulatory Visit | Attending: Physician Assistant | Admitting: Physician Assistant

## 2019-09-21 DIAGNOSIS — Z20822 Contact with and (suspected) exposure to covid-19: Secondary | ICD-10-CM | POA: Diagnosis not present

## 2019-09-21 DIAGNOSIS — Z01812 Encounter for preprocedural laboratory examination: Secondary | ICD-10-CM | POA: Diagnosis not present

## 2019-09-21 LAB — SARS CORONAVIRUS 2 (TAT 6-24 HRS): SARS Coronavirus 2: NEGATIVE

## 2019-09-25 ENCOUNTER — Other Ambulatory Visit: Payer: Self-pay

## 2019-09-25 ENCOUNTER — Other Ambulatory Visit (HOSPITAL_COMMUNITY): Payer: Self-pay | Admitting: Physician Assistant

## 2019-09-25 ENCOUNTER — Ambulatory Visit (HOSPITAL_COMMUNITY)
Admission: RE | Admit: 2019-09-25 | Discharge: 2019-09-25 | Disposition: A | Discharge status: Home / Self Care | Payer: Medicare Other | Source: Ambulatory Visit | Attending: Internal Medicine | Admitting: Internal Medicine

## 2019-09-25 DIAGNOSIS — I480 Paroxysmal atrial fibrillation: Secondary | ICD-10-CM | POA: Diagnosis not present

## 2019-09-25 LAB — EXERCISE TOLERANCE TEST
Estimated workload: 5.3 METS
Exercise duration (min): 3 min
Exercise duration (sec): 37 s
MPHR: 153 {beats}/min
Peak HR: 136 {beats}/min
Percent HR: 88 %
RPE: 18
Rest HR: 65 {beats}/min

## 2019-10-09 ENCOUNTER — Ambulatory Visit
Admission: RE | Admit: 2019-10-09 | Discharge: 2019-10-09 | Disposition: A | Discharge status: Home / Self Care | Payer: Medicare Other | Source: Ambulatory Visit | Attending: Registered Nurse | Admitting: Registered Nurse

## 2019-10-09 ENCOUNTER — Encounter: Payer: Medicare Other | Attending: Physical Medicine & Rehabilitation | Admitting: Registered Nurse

## 2019-10-09 ENCOUNTER — Other Ambulatory Visit: Payer: Self-pay

## 2019-10-09 ENCOUNTER — Encounter: Payer: Self-pay | Admitting: Registered Nurse

## 2019-10-09 VITALS — BP 152/72 | HR 61 | Temp 98.9°F | Ht 65.0 in | Wt 232.0 lb

## 2019-10-09 DIAGNOSIS — Z79891 Long term (current) use of opiate analgesic: Secondary | ICD-10-CM

## 2019-10-09 DIAGNOSIS — S79911A Unspecified injury of right hip, initial encounter: Secondary | ICD-10-CM | POA: Diagnosis not present

## 2019-10-09 DIAGNOSIS — G8929 Other chronic pain: Secondary | ICD-10-CM

## 2019-10-09 DIAGNOSIS — W19XXXA Unspecified fall, initial encounter: Secondary | ICD-10-CM

## 2019-10-09 DIAGNOSIS — Z79899 Other long term (current) drug therapy: Secondary | ICD-10-CM | POA: Insufficient documentation

## 2019-10-09 DIAGNOSIS — G894 Chronic pain syndrome: Secondary | ICD-10-CM | POA: Diagnosis not present

## 2019-10-09 DIAGNOSIS — R0781 Pleurodynia: Secondary | ICD-10-CM

## 2019-10-09 DIAGNOSIS — Y92009 Unspecified place in unspecified non-institutional (private) residence as the place of occurrence of the external cause: Secondary | ICD-10-CM

## 2019-10-09 DIAGNOSIS — M961 Postlaminectomy syndrome, not elsewhere classified: Secondary | ICD-10-CM

## 2019-10-09 DIAGNOSIS — Z5181 Encounter for therapeutic drug level monitoring: Secondary | ICD-10-CM | POA: Diagnosis not present

## 2019-10-09 DIAGNOSIS — M546 Pain in thoracic spine: Secondary | ICD-10-CM

## 2019-10-09 DIAGNOSIS — M542 Cervicalgia: Secondary | ICD-10-CM

## 2019-10-09 DIAGNOSIS — M25551 Pain in right hip: Secondary | ICD-10-CM | POA: Diagnosis not present

## 2019-10-09 MED ORDER — MORPHINE SULFATE ER 15 MG PO TBCR: 15.0000 mg | EXTENDED_RELEASE_TABLET | Freq: Three times a day (TID) | ORAL | 0 refills | Status: DC

## 2019-10-09 NOTE — Progress Notes (Signed)
Subjective:    Patient ID: [name], female    DOB: September 05, 1950, 69 y.o.   MRN: [id number]  HPI: [name] is a 69 y.o. female who returns for follow up appointment for chronic pain and medication refill. She states her pain is located in her right rib and right hip pain  s/p fall in her yard last week.  Also reports mid- back pain. She rates her pain 6. Her current exercise regime is walking and performing stretching exercises.  [name] reports on 09/18/2019, she was in a restaurant bathroom room, when she began to stand up, her clothes became wrapped around her legs and she fell forward. reports she hit her head against the door, denies falling on floor and denies any loss of consciousness. She didn't seek medical attention. She was instructed to F/U with her PCP, she verbalizes understanding.  Also reports last week Tuesday she was in her backyard reaching for a limb on the tree and fell forward and tumble landed on her back, she was able to pick herself up. She didn't seek medical attention, reports right rib pain and right hip pain. We will order right rib X-ray and right hip X-ray, she agrees with the above and verbalizes understanding.  [name] was educated on falls prevention and she verbalizes understanding. Also instructed to F/U with her PCP, she verbalizes understanding.   [name] Morphine equivalent is 45.00 MME.  UDS ordered today.   Pain Inventory Average Pain 6 Pain Right Now 6 My pain is constant, sharp, burning, stabbing and aching  In the last 24 hours, has pain interfered with the following? General activity 7 Relation with others 8 Enjoyment of life 7 What TIME of day is your pain at its worst? morning evening night Sleep (in general) Good  Pain is worse with: walking, bending, standing and some activites Pain improves with: rest, heat/ice and medication Relief from Meds: 4  Mobility walk without assistance how many minutes can you walk?  10-15 ability to climb steps?  yes do you drive?  yes  Function I need assistance with the following:  meal prep, household duties and shopping  Neuro/Psych bladder control problems weakness numbness tremor spasms depression  Prior Studies Any changes since last visit?  no  Physicians involved in your care Any changes since last visit?  no   Family History  Problem Relation Age of Onset  . Breast cancer Mother   . Diabetes Mother   . Heart disease Mother   . Allergies Mother   . Diabetes Father   . Heart disease Father   . Emphysema Father   . Allergies Father   . Stomach cancer Maternal Uncle   . Breast cancer Paternal Aunt   . Colon cancer Neg Hx    Social History   Socioeconomic History  . Marital status: Married    Spouse name: Not on file  . Number of children: Not on file  . Years of education: Not on file  . Highest education level: Not on file  Occupational History  . Occupation: Radiation protection practitioner  . Occupation: retired  Tobacco Use  . Smoking status: Never Smoker  . Smokeless tobacco: Never Used  Substance and Sexual Activity  . Alcohol use: Yes    Alcohol/week: 1.0 standard drinks    Types: 1 Glasses of wine per week    Comment: rare  . Drug use: Never  . Sexual activity: Not Currently  Other Topics Concern  . Not  on file  Social History Narrative  . Not on file   Social Determinants of Health   Financial Resource Strain: Low Risk   . Difficulty of Paying Living Expenses: Not hard at all  Food Insecurity: No Food Insecurity  . Worried About Charity fundraiser in the Last Year: Never true  . Ran Out of Food in the Last Year: Never true  Transportation Needs: No Transportation Needs  . Lack of Transportation (Medical): No  . Lack of Transportation (Non-Medical): No  Physical Activity: Inactive  . Days of Exercise per Week: 0 days  . Minutes of Exercise per Session: 0 min  Stress: No Stress Concern Present  . Feeling of Stress : Not at all   Social Connections:   . Frequency of Communication with Friends and Family:   . Frequency of Social Gatherings with Friends and Family:   . Attends Religious Services:   . Active Member of Clubs or Organizations:   . Attends Archivist Meetings:   Marland Kitchen Marital Status:    Past Surgical History:  Procedure Laterality Date  . [place] STUDY N/A 01/04/2016   Procedure: [place] STUDY;  Surgeon: Manus Gunning, MD;  Location: [facility] ENDOSCOPY;  Service: Gastroenterology;  Laterality: N/A;  . ABDOMINAL HYSTERECTOMY  1988  . ADENOIDECTOMY    . ANTERIOR CERVICAL DECOMP/DISCECTOMY FUSION  2009, 1993   first procedure in ~ 1993/Dr Botero  . APPENDECTOMY  1988  . BACK SURGERY    . CARDIOVERSION N/A 04/03/2019   Procedure: CARDIOVERSION;  Surgeon: Dorothy Spark, MD;  Location: [facility] ENDOSCOPY;  Service: Cardiovascular;  Laterality: N/A;  . CHOLECYSTECTOMY  2005  . ESOPHAGEAL MANOMETRY N/A 01/04/2016   Procedure: ESOPHAGEAL MANOMETRY (EM) 24 HR PH;  Surgeon: Manus Gunning, MD;  Location: [facility]OS[facility];  Service: Gastroenterology;  Laterality: N/A;  . ESOPHAGOGASTRODUODENOSCOPY (EGD) WITH PROPOFOL N/A 05/17/2016   Procedure: ESOPHAGOGASTRODUODENOSCOPY (EGD) WITH PROPOFOL;  Surgeon: Manus Gunning, MD;  Location: [facility] ENDOS[facility];  Service: Gastroenterology;  Laterality: N/A;  . NASAL SINUS SURGERY  2006  . SAVORY DILATION N/A 05/17/2016   Procedure: SAVORY DILATION;  Surgeon: Manus Gunning, MD;  Location: [facility];  Service: Gastroenterology;  Laterality: N/A;  . TEE WITHOUT CARDIOVERSION N/A 04/03/2019   Procedure: TRANSESOPHAGEAL ECHOCARDIOGRAM (TEE)-with DCCV;  Surgeon: Dorothy Spark, MD;  Location: [facility] ENDOSCOPY;; EF 55 to 60%.  No LVH.  Normal RV.  Moderate LA dilation.  No thrombus.  Mild RA dilation.  Mild to moderate TR.  Mild aortic valve sclerosis, no stenosis.  Moderately elevated PA pressures (47 mmHg)  . TONSILLECTOMY AND ADENOIDECTOMY    .  TRANSTHORACIC ECHOCARDIOGRAM  2009   Normal LV size and function. EF 60-65%. No regional wall motion and amount is. Mild aortic sclerosis, no stenosis  . TUBAL LIGATION  1979   Past Medical History:  Diagnosis Date  . Allergy   . Anemia   . Arthritis   . B12 deficiency   . Complication of anesthesia    slow to wake up   . Depression   . Diabetes mellitus type 2, diet-controlled ([place]) 07/24/2018  . Fibromyalgia   . Generalized hyperhidrosis 04/17/2014  . GERD (gastroesophageal reflux disease)   . Hyperlipemia   . HYPOTHYROIDISM 05/29/2007  . Irritable bowel syndrome   . Migraines    hx of   . OSA (obstructive sleep apnea) 06/26/2019  . Oxygen deficiency    2 L at bedtime  . PVC (  premature ventricular contraction)   . Senile calcific aortic valve sclerosis    Aortic Sclerosis-no stenosis  . Unspecified essential hypertension    denies htn on 05/09/16   BP (!) 152/72   Pulse 61   Temp 98.9 F (37.2 C)   Ht 5\' 5"  (1.651 m)   Wt 232 lb (105.2 kg)   SpO2 97%   BMI 38.61 kg/m   Opioid Risk Score:   Fall Risk Score:  `1  Depression screen PHQ 2/9  Depression screen [facility] 2/9 06/19/2019 01/18/2019 08/08/2018 04/24/2018 01/10/2018 11/24/2017 03/27/2017  Decreased Interest 1 0 0 0 2 1 0  Down, Depressed, Hopeless 1 0 0 0 0 1 0  PHQ - 2 Score 2 0 0 0 2 2 0  Altered sleeping - 0 - - 0 - -  Tired, decreased energy - 3 - - 1 - -  Change in appetite - 0 - - 0 - -  Feeling bad or failure about yourself  - 0 - - 0 - -  Trouble concentrating - 0 - - 1 - -  Moving slowly or fidgety/restless - 0 - - 0 - -  Suicidal thoughts - 0 - - 0 - -  PHQ-9 Score - 3 - - 4 - -  Difficult doing work/chores - Not difficult at all - - Not difficult at all Not difficult at all -  Some recent data might be hidden    Review of Systems  Neurological: Positive for tremors, weakness and numbness.  All other systems reviewed and are negative.      Objective:   Physical Exam Vitals and nursing note  reviewed.  Constitutional:      Appearance: Normal appearance.  Cardiovascular:     Rate and Rhythm: Normal rate and regular rhythm.     Pulses: Normal pulses.     Heart sounds: Normal heart sounds.  Pulmonary:     Effort: Pulmonary effort is normal.     Breath sounds: Normal breath sounds.  Musculoskeletal:     Cervical back: Normal range of motion and neck supple.     Right lower leg: Edema present.     Left lower leg: Edema present.     Comments: Normal Muscle Bulk and Muscle Testing Reveals:  Upper Extremities: Full ROM and Muscle Strength 5/5 Thoracic Paraspinal Tenderness: T-7-T-9 Right Greater Trochanter Tenderness Lower Extremities: Full ROM and Muscle Strength 5/5 Arises from chair with ease Narrow Based  Gait   Skin:    General: Skin is warm and dry.  Neurological:     Mental Status: She is alert and oriented to person, place, and time.  Psychiatric:        Mood and Affect: Mood normal.        Behavior: Behavior normal.         Assessment & Plan:  1. Cervical postlaminectomy syndrome:/ Cervicalgia/ Cervical Radiculitis/with neck and shoulder pain/ Chronic Midline Thoracic Pain.10/09/2019.Continue exercise routine and using heat therapy. Refilled:current medication regimen withMS Contin 15 mg one tablet three times a day #90.  We will continue the opioid monitoring program, this consists of regular clinic visits, examinations, urine drug screen, pill counts as well as use of [place] Controlled Substance reporting System. 2. Fibromyalgia/ Neuropathy: Continuecurrent medication regimen withGabapentin, activity and exercise regime.10/09/2019. 3.Left Knee Pain/BilateralKnee Pain/ Mild Degenerative Changes: No complaints today. Continuecurrent medication regimen withVoltaren Gel.10/09/2019. 4.BilateralAnkle Pain:No complaints today.Continue HEP as tolerated. Continueto monitor.10/09/2019. 5. Muscle Spasm: Continuecurrent medication regimen  withRobaxin.10/09/2019.. 6. Opioid  Induced Constipation: No complaints Continue to Monitor.10/09/2019. 7. Midline Low Back Pain: Continue current medication regime, continue HEP as tolerated.10/09/2019. 8. Right Gluteal Medius Weakness and Atrophy: No complaints Today:Continue with Hip Abduction Exercisesas tolerated: Continue to Monitor.10/09/2019. 9. Right Hip Pain/ Right Greater Trochanteric Tenderness: RX: Right Hip X-ray. Continue to Alternate Ice and Heat Therapy. Continue to Monitor.10/09/2019 10. Sacroiliac Joint Pain:No complaints today.S/PSacroiliac Injection on 08/24/2018 with good relief noted.10/09/2019 11.Lumbar Radiculitis: No complaints todayContinue Gabapentin.Continue HEP as tolerated. Continue current medication regimen. Continue to monitor.10/09/2019. 12. Right Rib Pain: S/P Fall RX: X-ray: Results Pending. 10/09/2019.  13. Right Hip Pain/ S/P Fall : RX: Right Hip X-ray. 10/09/2019.  14. Falls: Educated on Falls Prevention: She verbalizes understanding.    F/U in 1 month

## 2019-10-10 DIAGNOSIS — G4733 Obstructive sleep apnea (adult) (pediatric): Secondary | ICD-10-CM | POA: Diagnosis not present

## 2019-10-10 DIAGNOSIS — J9610 Chronic respiratory failure, unspecified whether with hypoxia or hypercapnia: Secondary | ICD-10-CM | POA: Diagnosis not present

## 2019-10-11 LAB — TOXASSURE SELECT,+ANTIDEPR,UR

## 2019-10-14 ENCOUNTER — Telehealth: Payer: Self-pay | Admitting: *Deleted

## 2019-10-14 NOTE — Telephone Encounter (Signed)
Urine drug screen for this encounter is consistent for prescribed medication 

## 2019-10-17 ENCOUNTER — Ambulatory Visit (INDEPENDENT_AMBULATORY_CARE_PROVIDER_SITE_OTHER): Payer: Medicare Other | Admitting: Family Medicine

## 2019-10-17 ENCOUNTER — Other Ambulatory Visit: Payer: Self-pay

## 2019-10-17 ENCOUNTER — Encounter: Payer: Self-pay | Admitting: Family Medicine

## 2019-10-17 VITALS — BP 110/62 | HR 65 | Temp 98.4°F | Ht 65.0 in | Wt 234.2 lb

## 2019-10-17 DIAGNOSIS — W19XXXA Unspecified fall, initial encounter: Secondary | ICD-10-CM | POA: Diagnosis not present

## 2019-10-17 DIAGNOSIS — M25569 Pain in unspecified knee: Secondary | ICD-10-CM | POA: Diagnosis not present

## 2019-10-17 NOTE — Progress Notes (Signed)
Alicia Lavery T. Aquarius Latouche, MD, [place] at [facility] [place], [address]  Phone: [phone]  FAX: [phone]  [name] - 69 y.o. female  MRN [id number]  Date of Birth: [birth year]  Date: 10/17/2019  PCP: Owens Loffler, MD  Referral: Owens Loffler, MD  Chief Complaint  Patient presents with  . Fall    09/18/19-Hit head on door jam & 10/01/19 fell in yard    This visit occurred during the SARS-CoV-2 public health emergency.  Safety protocols were in place, including screening questions prior to the visit, additional usage of staff PPE, and extensive cleaning of exam room while observing appropriate contact time as indicated for disinfecting solutions.   Subjective:   [name] is a 69 y.o. very pleasant female patient with Body mass index is 38.98 kg/m. who presents with the following:  L knee hurt a little bit.  [address] and hit her head.  Hit on a doorjam.  Just hurt and tender for while and has a knot.  April six fell in her yard.  Reaching up and hisband had tripped on the concree and held on to structure.  Did not hit her head.  The patient fell on September 18, 2019 at that point she hit her head on a door jam.  She did not have a sensation of nausea, dizziness, or significant headache.  Her balance was normal.  She did not have any phonophobia or photophobia.  She does still have a small enlargement of both left lateral aspect of her skull.  She also fell on October 01, 2019 and at that point she fell onto the grass without head strike.  She did have some pain in her hip as well as in her right side of her ribs.  She has had some rib films as well as a hip film which was essentially entirely normal.  She does have some mild degenerative changes in the right hip.  Review of Systems is noted in the HPI, as appropriate  Objective:   BP 110/62   Pulse 65   Temp 98.4 F (36.9  C) (Temporal)   Ht 5\' 5"  (1.651 m)   Wt 234 lb 4 oz (106.3 kg)   SpO2 97%   BMI 38.98 kg/m   GEN: No acute distress; alert,appropriate. PULM: Breathing comfortably in no respiratory distress PSYCH: Normally interactive.   She moves her neck in all directions without any difficulty.  Renal nerves II through XII are intact.  All BESS testing is normal with feet together, unsteady on tandem as well as 1 foot, but the patient thinks that this is at baseline for her.  Laboratory and Imaging Data:  Assessment and Plan:     ICD-10-CM   1. Knee pain, unspecified chronicity, unspecified laterality  M25.569   2. Fall, initial encounter  W19.XXXA    At this point she is asymptomatic, she has a small knot in her head, all rib discomfort is entirely normal.  She does have some mild tenderness at the pes bursa, but at this point it is minimal to none.  Nontender along the joint lines and some minimal to normal tenderness at the distal patella.  Of no significant concern, and the patient is basically already recovered.  Follow-up: No follow-ups on file.  No orders of the defined types were placed in this encounter.  Medications Discontinued During This Encounter  Medication Reason  .  citalopram (CELEXA) 40 MG tablet Duplicate   No orders of the defined types were placed in this encounter.   Signed,  Maud Deed. Claribel Sachs, MD   Outpatient Encounter Medications as of 10/17/2019  Medication Sig  . acetaminophen (TYLENOL) 650 MG CR tablet Take 650 mg by mouth in the morning and at bedtime.   Marland Kitchen apixaban (ELIQUIS) 5 MG TABS tablet Take 1 tablet (5 mg total) by mouth 2 (two) times daily.  . B Complex Vitamins (B COMPLEX 100 PO) Take 1 tablet by mouth every morning.   . bisacodyl (DULCOLAX) 5 MG EC tablet Take 10 mg by mouth at bedtime.   . calcium carbonate (TUMS EX) 750 MG chewable tablet Chew 1-2 tablets by mouth 2 (two) times daily as needed for heartburn (acid reflux).  . Calcium  Carbonate-Vitamin D (CALCIUM 600+D PO) Take 1 tablet by mouth in the morning and at bedtime.   . carboxymethylcellulose (REFRESH PLUS) 0.5 % SOLN Place 1 drop into both eyes 3 (three) times daily as needed (dry eyes).   . cetirizine (ZYRTEC) 10 MG tablet Take 10 mg by mouth at bedtime.   . Cholecalciferol (VITAMIN D) 125 MCG (5000 UT) CAPS Take 5,000 Units by mouth every morning.   . citalopram (CELEXA) 40 MG tablet Take 20 mg by mouth daily.  . clindamycin (CLEOCIN T) 1 % lotion Apply 1 application topically daily as needed (eczema).   . Coenzyme Q-10 100 MG capsule Take 100 mg by mouth every morning.   . diclofenac sodium (VOLTAREN) 1 % GEL Apply 4 g topically 4 (four) times daily as needed (joint pain).   Marland Kitchen diltiazem (CARDIZEM CD) 240 MG 24 hr capsule Take 1 capsule (240 mg total) by mouth daily.  Marland Kitchen docusate sodium (COLACE) 250 MG capsule Take 250 mg by mouth at bedtime.  . flecainide (TAMBOCOR) 50 MG tablet Take 1 tablet (50 mg total) by mouth 2 (two) times daily.  . fluocinonide (LIDEX) 0.05 % external solution Apply 1 application topically daily as needed (eczema on scalp).   . gabapentin (NEURONTIN) 300 MG capsule TAKE 1 CAPSULE BY MOUTH 3  TIMES DAILY (Patient taking differently: Take 300 mg by mouth 3 (three) times daily. )  . GRAPE SEED EXTRACT PO Take 100 mg by mouth 2 (two) times daily.   Marland Kitchen levothyroxine (SYNTHROID) 50 MCG tablet TAKE 1 TABLET BY MOUTH  DAILY (Patient taking differently: Take 50 mcg by mouth daily before breakfast. )  . loratadine (CLARITIN) 10 MG tablet Take 10 mg by mouth every morning.   . Magnesium Bisglycinate (MAG GLYCINATE PO) Take 400 mg by mouth at bedtime.   . methocarbamol (ROBAXIN) 500 MG tablet TAKE ONE (1) TABLET BY MOUTH EVERY EIGHTHOURS AS NEEDED FOR MUSCLE SPASMS (Patient taking differently: Take 500 mg by mouth 2 (two) times daily as needed for muscle spasms. )  . mirtazapine (REMERON) 15 MG tablet Take 1 tablet (15 mg total) by mouth at bedtime.  Marland Kitchen  morphine (MS CONTIN) 15 MG 12 hr tablet Take 1 tablet (15 mg total) by mouth 3 (three) times daily.  . nitroGLYCERIN (NITROSTAT) 0.4 MG SL tablet Place 1 tablet (0.4 mg total) under the tongue every 5 (five) minutes as needed for chest pain.  Marland Kitchen oxybutynin (DITROPAN-XL) 10 MG 24 hr tablet TAKE 1 TABLET BY MOUTH AT  BEDTIME (Patient taking differently: Take 10 mg by mouth at bedtime. )  . oxymetazoline (AFRIN) 0.05 % nasal spray Place 1 spray into both nostrils 2 (two)  times daily as needed for congestion.  . RABEprazole (ACIPHEX) 20 MG tablet Take 1 tablet (20 mg total) by mouth daily. (Patient taking differently: Take 20 mg by mouth at bedtime. )  . tretinoin (RETIN-A) 0.05 % cream Apply 1 application topically daily as needed (acne).   . vitamin B-12 (CYANOCOBALAMIN) 1000 MCG tablet Take 1,000 mcg by mouth daily.  . [DISCONTINUED] citalopram (CELEXA) 40 MG tablet Take 1 tablet (40 mg total) by mouth daily. (Patient taking differently: Take 20 mg by mouth daily. )   No facility-administered encounter medications on file as of 10/17/2019.

## 2019-10-23 ENCOUNTER — Other Ambulatory Visit: Payer: Self-pay

## 2019-10-23 ENCOUNTER — Ambulatory Visit: Payer: Medicare Other

## 2019-10-23 DIAGNOSIS — E119 Type 2 diabetes mellitus without complications: Secondary | ICD-10-CM

## 2019-10-23 DIAGNOSIS — E782 Mixed hyperlipidemia: Secondary | ICD-10-CM
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
## 2019-10-23 NOTE — Patient Instructions (Signed)
October 23, 2019  Dear [name],  It was a pleasure meeting you during our initial appointment on October 23, 2019. Below is a summary of the goals we discussed and components of chronic care management. Please contact me anytime with questions or concerns.   Visit Information  Goals Addressed            This Visit's Progress   . Pharmacy Care Plan       CARE PLAN ENTRY  Current Barriers:  . Chronic Disease Management support, education, and care coordination needs related to vaccinations, excessive sweating  Pharmacist Clinical Goal(s):  Marland Kitchen Over the next 6 months, patient will work with PharmD and primary care provider to address the following goals: o Reduce medication burden: Trial reducing oxybutynin to every other day and tapering slowly to see if sweating remains a concern o Vaccinations: Remain up to date on vaccinations. Recommend 2-dose series of shingles vaccine (Shingrix).  Interventions: . Comprehensive medication review performed  Patient Self Care Activities:  For the next 6 months until follow up visit:  . Check with local pharmacy for shingles vaccine . Please call if any concerns with oxybutynin taper/sweating  Initial goal documentation    . Pharmacy Care Plan: Diabetes       CARE PLAN: Diabetes  Current Barriers:  . Diabetes: uncontrolled (A1c goal < 7%); complicated by chronic medical conditions including hypertension, hyperlipidemia Lab Results  Component Value Date   HGBA1C 7.3 (H) 08/01/2019   Lab Results  Component Value Date   CREATININE 0.71 08/29/2019   CREATININE 0.80 08/01/2019   CREATININE 0.89 04/13/2019 .  Current antihyperglycemic regimen: no pharmacotherapy . Denies hypoglycemic symptoms, including dizziness, lightheadedness, shaking, sweating . Current meal patterns: reports diet high in vegetables and protein, specifically chicken mostly, beef twice weekly, fish once weekly, and fruit  . Current exercise: minimal . Current  blood glucose readings: none reported  Pharmacist Clinical Goal(s):  Marland Kitchen Over the next 6 months, patient will work with PharmD and primary care provider towards the following goals: o Reduce A1c within goal of less than 7%  Interventions: . Comprehensive medication review performed, medication list updated in electronic medical record  Patient Self Care Activities:  Over the next 6 months until follow up visit: . Patient would like to decrease ice cream/sweets . Consider resuming metformin therapy if A1c remains elevated  Initial goal documentation    . Pharmacy Care Plan: Hyperlipidemia       CARE PLAN: Cholesterol  Current Barriers:  . Uncontrolled hyperlipidemia, complicated by diabetes, hypertension . Current antihyperlipidemic regimen:  o No prescription pharmacotherapy o OTC supplement: resveratrol 100 mg - 1 tablet twice daily  . Previous antihyperlipidemic medications tried: rosuvastatin (leg cramps/pain) . Most recent lipid panel:     Component Value Date/Time   CHOL 176 08/01/2019 0919   TRIG 96.0 08/01/2019 0919   HDL 49.60 08/01/2019 0919   CHOLHDL 4 08/01/2019 0919   VLDL 19.2 08/01/2019 0919   LDLCALC 107 (H) 08/01/2019 0919   LDLDIRECT 125.0 01/18/2019 1407 .  ASCVD risk enhancing conditions: age >76, DM, HTN . 10-year ASCVD risk score: 12.6%  Pharmacist Clinical Goal(s):  Marland Kitchen Over the next 6 months, patient will work with PharmD and providers towards the following goals: o Improve LDL (bad cholesterol) within goal < 100 o Maintain HDL (good cholesterol) goal > 50; Triglyceride goal < 150 o Increase exercise with goal of 30 minutes, 5 days per week o Diet high in vegetables, fruits  and whole grains with low-fat dairy products, chicken, fish, legumes, non-tropical vegetable oils and nuts. Limit intake of sweets, sugar-sweetened beverages and red meats.  Interventions: . Comprehensive medication review performed; medication list updated in electronic medical  record.  . Counseled on ASCVD risk score and how to reduce risk with diet, exercise, and medication options (for example, one weekly statin therapy)  Patient Self Care Activities:  . Begin exercising slowly for 10-30 minutes 2-3 days per week . Review cholesterol in foods handout for heart healthy diet tips  Initial goal documentation       [name] was given information about Chronic Care Management services today including:  1. CCM service includes personalized support from designated clinical staff supervised by her physician, including individualized plan of care and coordination with other care providers 2. 24/7 contact phone numbers for assistance for urgent and routine care needs. 3. Standard insurance, coinsurance, copays and deductibles apply for chronic care management only during months in which we provide at least 20 minutes of these services. Most insurances cover these services at 100%, however patients may be responsible for any copay, coinsurance and/or deductible if applicable. This service may help you avoid the need for more expensive face-to-face services. 4. Only one practitioner may furnish and bill the service in a calendar month. 5. The patient may stop CCM services at any time (effective at the end of the month) by phone call to the office staff.  Patient agreed to services and verbal consent obtained.   The patient verbalized understanding of instructions provided today and agreed to receive a mailed copy of patient instruction and/or educational materials.   Telephone follow up appointment with pharmacy team member scheduled for: 05/13/20 at 11:00 AM (telephone)  Debbora Dus, PharmD Clinical Pharmacist [facility] at [facility] [phone]   Cholesterol Content in Foods Cholesterol is a waxy, fat-like substance that helps to carry fat in the blood. The body needs cholesterol in small amounts, but too much cholesterol can cause damage to the  arteries and heart. Most people should eat less than 200 milligrams (mg) of cholesterol a day. Foods with cholesterol  Cholesterol is found in animal-based foods, such as meat, seafood, and dairy. Generally, low-fat dairy and lean meats have less cholesterol than full-fat dairy and fatty meats. The milligrams of cholesterol per serving (mg per serving) of common cholesterol-containing foods are listed below. Meat and other proteins  Egg - one large whole egg has 186 mg.  Veal shank - 4 oz has 141 mg.  Lean ground [place] (93% lean) - 4 oz has 118 mg.  Fat-trimmed lamb loin - 4 oz has 106 mg.  Lean ground beef (90% lean) - 4 oz has 100 mg.  Lobster - 3.5 oz has 90 mg.  Pork loin chops - 4 oz has 86 mg.  Canned salmon - 3.5 oz has 83 mg.  Fat-trimmed beef top loin - 4 oz has 78 mg.  Frankfurter - 1 frank (3.5 oz) has 77 mg.  Crab - 3.5 oz has 71 mg.  Roasted chicken without skin, white meat - 4 oz has 66 mg.  Light bologna - 2 oz has 45 mg.  Deli-cut [place] - 2 oz has 31 mg.  Canned tuna - 3.5 oz has 31 mg.  Bacon - 1 oz has 29 mg.  Oysters and mussels (raw) - 3.5 oz has 25 mg.  Mackerel - 1 oz has 22 mg.  Trout - 1 oz has 20 mg.  Pork sausage - 1 link (1 oz) has 17 mg.  Salmon - 1 oz has 16 mg.  Tilapia - 1 oz has 14 mg. Dairy  Soft-serve ice cream -  cup (4 oz) has 103 mg.  Whole-milk yogurt - 1 cup (8 oz) has 29 mg.  Cheddar cheese - 1 oz has 28 mg.  American cheese - 1 oz has 28 mg.  Whole milk - 1 cup (8 oz) has 23 mg.  2% milk - 1 cup (8 oz) has 18 mg.  Cream cheese - 1 tablespoon (Tbsp) has 15 mg.  Cottage cheese -  cup (4 oz) has 14 mg.  Low-fat (1%) milk - 1 cup (8 oz) has 10 mg.  Sour cream - 1 Tbsp has 8.5 mg.  Low-fat yogurt - 1 cup (8 oz) has 8 mg.  Nonfat Greek yogurt - 1 cup (8 oz) has 7 mg.  Half-and-half cream - 1 Tbsp has 5 mg. Fats and oils  Cod liver oil - 1 tablespoon (Tbsp) has 82 mg.  Butter - 1 Tbsp has 15 mg.   Lard - 1 Tbsp has 14 mg.  Bacon grease - 1 Tbsp has 14 mg.  Mayonnaise - 1 Tbsp has 5-10 mg.  Margarine - 1 Tbsp has 3-10 mg. Exact amounts of cholesterol in these foods may vary depending on specific ingredients and brands. Foods without cholesterol Most plant-based foods do not have cholesterol unless you combine them with a food that has cholesterol. Foods without cholesterol include:  Grains and cereals.  Vegetables.  Fruits.  Vegetable oils, such as olive, canola, and sunflower oil.  Legumes, such as peas, beans, and lentils.  Nuts and seeds.  Egg whites. Summary  The body needs cholesterol in small amounts, but too much cholesterol can cause damage to the arteries and heart.  Most people should eat less than 200 milligrams (mg) of cholesterol a day. This information is not intended to replace advice given to you by your health care provider. Make sure you discuss any questions you have with your health care provider. Document Revised: 05/26/2017 Document Reviewed: 02/07/2017 Elsevier Patient Education  [place].

## 2019-10-24 ENCOUNTER — Other Ambulatory Visit: Payer: Self-pay | Admitting: Cardiology

## 2019-10-25 ENCOUNTER — Other Ambulatory Visit (HOSPITAL_COMMUNITY): Payer: Self-pay | Admitting: Physician Assistant
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
## 2019-11-01 NOTE — Telephone Encounter (Signed)
Please advise on patients mychart message:  Good morning! I need your assistance again. I'm still waiting for [employer] to mail me a chin strap to see if it stops my bi-pap air leak issue. When I finally spoke with their rep over a month after you placed the order for a face mask, I was told my insurance would not pay for it because I had not had the machine long enough. This makes no sense to me, but she said she'd send a chin strap & new nasal pillow in the mail. That was 3 weeks ago (2 months after your initial request for a face mask) & I'm still waiting. Since [employer] is so unreliable, would it be possible to change companies for my bi-pap & supplies? Is there another company that does better? Thanks for your help!

## 2019-11-03 ENCOUNTER — Other Ambulatory Visit: Payer: Self-pay | Admitting: Cardiology

## 2019-11-05 ENCOUNTER — Telehealth: Payer: Self-pay

## 2019-11-05 NOTE — Telephone Encounter (Signed)
Patient called today to discuss medication concern.   At last visit we discussed potential citalopram taper due to concern that it may be contributing to sweating and pts desire to stop oxybutynin. She has stopped citalopram and reports doing well without it although no change in sweating.  She remains concerned about adverse effects with oxybutynin. She tried taking the 10 mg tablet every other day and sweating worsened. She would like to try a reduced dose of oxybutynin 5 mg 24 hr - 1 tablet daily.  Debbora Dus, PharmD Clinical Pharmacist [facility] at [facility] [phone]

## 2019-11-06 MED ORDER — OXYBUTYNIN CHLORIDE ER 5 MG PO TB24: 5.0000 mg | ORAL_TABLET | Freq: Every day | ORAL | 1 refills | Status: DC

## 2019-11-06 NOTE — Telephone Encounter (Signed)
done

## 2019-11-08 ENCOUNTER — Other Ambulatory Visit: Payer: Self-pay

## 2019-11-08 ENCOUNTER — Ambulatory Visit
Admission: RE | Admit: 2019-11-08 | Discharge: 2019-11-08 | Disposition: A | Discharge status: Home / Self Care | Payer: Medicare Other | Source: Ambulatory Visit | Attending: Family Medicine | Admitting: Family Medicine

## 2019-11-08 DIAGNOSIS — J9610 Chronic respiratory failure, unspecified whether with hypoxia or hypercapnia: Secondary | ICD-10-CM | POA: Diagnosis not present

## 2019-11-08 DIAGNOSIS — G4733 Obstructive sleep apnea (adult) (pediatric): Secondary | ICD-10-CM | POA: Diagnosis not present

## 2019-11-08 DIAGNOSIS — Z1231 Encounter for screening mammogram for malignant neoplasm of breast: Secondary | ICD-10-CM | POA: Diagnosis not present

## 2019-11-09 DIAGNOSIS — G4733 Obstructive sleep apnea (adult) (pediatric): Secondary | ICD-10-CM | POA: Diagnosis not present

## 2019-11-09 DIAGNOSIS — J9610 Chronic respiratory failure, unspecified whether with hypoxia or hypercapnia: Secondary | ICD-10-CM | POA: Diagnosis not present

## 2019-11-12 ENCOUNTER — Encounter: Payer: Medicare Other | Attending: Physical Medicine & Rehabilitation | Admitting: Registered Nurse

## 2019-11-12 ENCOUNTER — Encounter: Payer: Self-pay | Admitting: Registered Nurse

## 2019-11-12 ENCOUNTER — Other Ambulatory Visit: Payer: Self-pay

## 2019-11-12 VITALS — BP 129/74 | HR 68 | Temp 97.3°F | Ht 65.0 in | Wt 232.0 lb

## 2019-11-12 DIAGNOSIS — M961 Postlaminectomy syndrome, not elsewhere classified: Secondary | ICD-10-CM | POA: Diagnosis not present

## 2019-11-12 DIAGNOSIS — M546 Pain in thoracic spine: Secondary | ICD-10-CM

## 2019-11-12 DIAGNOSIS — Z79899 Other long term (current) drug therapy: Secondary | ICD-10-CM | POA: Insufficient documentation

## 2019-11-12 DIAGNOSIS — Z79891 Long term (current) use of opiate analgesic: Secondary | ICD-10-CM | POA: Diagnosis not present

## 2019-11-12 DIAGNOSIS — R0781 Pleurodynia: Secondary | ICD-10-CM | POA: Insufficient documentation

## 2019-11-12 DIAGNOSIS — Z5181 Encounter for therapeutic drug level monitoring: Secondary | ICD-10-CM | POA: Diagnosis not present

## 2019-11-12 DIAGNOSIS — G894 Chronic pain syndrome: Secondary | ICD-10-CM | POA: Insufficient documentation

## 2019-11-12 DIAGNOSIS — M25551 Pain in right hip: Secondary | ICD-10-CM | POA: Insufficient documentation

## 2019-11-12 DIAGNOSIS — M170 Bilateral primary osteoarthritis of knee: Secondary | ICD-10-CM

## 2019-11-12 DIAGNOSIS — M542 Cervicalgia: Secondary | ICD-10-CM

## 2019-11-12 DIAGNOSIS — G8929 Other chronic pain: Secondary | ICD-10-CM

## 2019-11-12 MED ORDER — MORPHINE SULFATE ER 15 MG PO TBCR: 15.0000 mg | EXTENDED_RELEASE_TABLET | Freq: Three times a day (TID) | ORAL | 0 refills | Status: DC

## 2019-11-12 NOTE — Progress Notes (Signed)
Subjective:    Patient ID: [name], female    DOB: 05-29-1951, 69 y.o.   MRN: [id number]  HPI: [name] is a 69 y.o. female who returns for follow up appointment for chronic pain and medication refill. She states her pain is located in her neck, mid- back and bilateral knee pain. She rates her pain 4. Her current exercise regime is walking and performing stretching exercises.  [name] Morphine equivalent is 45.00 MME.    Last UDS was Performed on 10/09/2019, it was consistent.    Pain Inventory Average Pain 5 Pain Right Now 4 My pain is constant, burning and aching  In the last 24 hours, has pain interfered with the following? General activity 4 Relation with others 4 Enjoyment of life 4 What TIME of day is your pain at its worst? evening, night Sleep (in general) Fair  Pain is worse with: walking, bending, standing and some activites Pain improves with: rest, heat/ice, pacing activities and medication Relief from Meds: 5  Mobility walk without assistance how many minutes can you walk? 10-15 ability to climb steps?  yes do you drive?  yes Do you have any goals in this area?  yes  Function retired I need assistance with the following:  meal prep, household duties and shopping Do you have any goals in this area?  yes  Neuro/Psych bladder control problems weakness numbness tremor depression  Prior Studies Any changes since last visit?  no  Physicians involved in your care Any changes since last visit?  no   Family History  Problem Relation Age of Onset  . Breast cancer Mother   . Diabetes Mother   . Heart disease Mother   . Allergies Mother   . Diabetes Father   . Heart disease Father   . Emphysema Father   . Allergies Father   . Stomach cancer Maternal Uncle   . Breast cancer Paternal Aunt   . Colon cancer Neg Hx    Social History   Socioeconomic History  . Marital status: Married    Spouse name: Not on file  . Number of children:  Not on file  . Years of education: Not on file  . Highest education level: Not on file  Occupational History  . Occupation: Radiation protection practitioner  . Occupation: retired  Tobacco Use  . Smoking status: Never Smoker  . Smokeless tobacco: Never Used  Substance and Sexual Activity  . Alcohol use: Yes    Alcohol/week: 1.0 standard drinks    Types: 1 Glasses of wine per week    Comment: rare  . Drug use: Never  . Sexual activity: Not Currently  Other Topics Concern  . Not on file  Social History Narrative  . Not on file   Social Determinants of Health   Financial Resource Strain: Low Risk   . Difficulty of Paying Living Expenses: Not hard at all  Food Insecurity: No Food Insecurity  . Worried About Charity fundraiser in the Last Year: Never true  . Ran Out of Food in the Last Year: Never true  Transportation Needs: No Transportation Needs  . Lack of Transportation (Medical): No  . Lack of Transportation (Non-Medical): No  Physical Activity: Inactive  . Days of Exercise per Week: 0 days  . Minutes of Exercise per Session: 0 min  Stress: No Stress Concern Present  . Feeling of Stress : Not at all  Social Connections:   . Frequency of Communication with Friends and  Family:   . Frequency of Social Gatherings with Friends and Family:   . Attends Religious Services:   . Active Member of Clubs or Organizations:   . Attends Archivist Meetings:   Marland Kitchen Marital Status:    Past Surgical History:  Procedure Laterality Date  . [place] STUDY N/A 01/04/2016   Procedure: [place] STUDY;  Surgeon: Manus Gunning, MD;  Location: [facility] ENDOSCOPY;  Service: Gastroenterology;  Laterality: N/A;  . ABDOMINAL HYSTERECTOMY  1988  . ADENOIDECTOMY    . ANTERIOR CERVICAL DECOMP/DISCECTOMY FUSION  2009, 1993   first procedure in ~ 1993/Dr Botero  . APPENDECTOMY  1988  . BACK SURGERY    . CARDIOVERSION N/A 04/03/2019   Procedure: CARDIOVERSION;  Surgeon: Dorothy Spark, MD;  Location[facility]  ENDOSCOPY;  Service: Cardiovascular;  Laterality: N/A;  . CHOLECYSTECTOMY  2005  . ESOPHAGEAL MANOMETRY N/A 01/04/2016   Procedure: ESOPHAGEAL MANOMETRY (EM) 24 HR PH;  Surgeon: Manus Gunning, MD;  Location: [facility] ENDOSCOPY;  Service: Gastroenterology;  Laterality: N/A;  . ESOPHAGOGASTRODUODENOSCOPY (EGD) WITH PROPOFOL N/A 05/17/2016   Procedure: ESOPHAGOGASTRODUODENOSCOPY (EGD) WITH PROPOFOL;  Surgeon: Manus Gunning, MD;  Location: [facility] ENDOSCOPY;  Service: Gastroenterology;  Laterality: N/A;  . NASAL SINUS SURGERY  2006  . SAVORY DILATION N/A 05/17/2016   Procedure: SAVORY DILATION;  Surgeon: Manus Gunning, MD;  Location: [facility];  Service: Gastroenterology;  Laterality: N/A;  . TEE WITHOUT CARDIOVERSION N/A 04/03/2019   Procedure: TRANSESOPHAGEAL ECHOCARDIOGRAM (TEE)-with DCCV;  Surgeon: Dorothy Spark, MD;  Location: [facility] END[facility]COPY;; EF 55 to 60%.  No LVH.  Normal RV.  Moderate LA dilation.  No thrombus.  Mild RA dilation.  Mild to moderate TR.  Mild aortic valve sclerosis, no stenosis.  Moderately elevated PA pressures (47 mmHg)  . TONSILLECTOMY AND ADENOIDECTOMY    . TRANSTHORACIC ECHOCARDIOGRAM  2009   Normal LV size and function. EF 60-65%. No regional wall motion and amount is. Mild aortic sclerosis, no stenosis  . TUBAL LIGATION  1979   Past Medical History:  Diagnosis Date  . Allergy   . Anemia   . Arthritis   . B12 deficiency   . Complication of anesthesia    slow to wake up   . Depression   . Diabetes mellitus type 2, diet-controlled ([place]) 07/24/2018  . Fibromyalgia   . Generalized hyperhidrosis 04/17/2014  . GERD (gastroesophageal reflux disease)   . Hyperlipemia   . HYPOTHYROIDISM 05/29/2007  . Irritable bowel syndrome   . Migraines    hx of   . OSA (obstructive sleep apnea) 06/26/2019  . Oxygen deficiency    2 L at bedtime  . PVC (premature ventricular contraction)   . Senile calcific aortic valve sclerosis    Aortic Sclerosis-no  stenosis  . Unspecified essential hypertension    denies htn on 05/09/16   BP 129/74   Pulse 68   Temp (!) 97.3 F (36.3 C)   Ht 5\' 5"  (1.651 m)   Wt 232 lb (105.2 kg)   SpO2 97%   BMI 38.61 kg/m   Opioid Risk Score:   Fall Risk Score:  `1  Depression screen PHQ 2/9  Depression screen [facility] 2/9 06/19/2019 01/18/2019 08/08/2018 04/24/2018 01/10/2018 11/24/2017 03/27/2017  Decreased Interest 1 0 0 0 2 1 0  Down, Depressed, Hopeless 1 0 0 0 0 1 0  PHQ - 2 Score 2 0 0 0 2 2 0  Altered sleeping - 0 - -  0 - -  Tired, decreased energy - 3 - - 1 - -  Change in appetite - 0 - - 0 - -  Feeling bad or failure about yourself  - 0 - - 0 - -  Trouble concentrating - 0 - - 1 - -  Moving slowly or fidgety/restless - 0 - - 0 - -  Suicidal thoughts - 0 - - 0 - -  PHQ-9 Score - 3 - - 4 - -  Difficult doing work/chores - Not difficult at all - - Not difficult at all Not difficult at all -  Some recent data might be hidden    Review of Systems  Constitutional: Negative.   HENT: Negative.   Eyes: Negative.   Respiratory: Positive for apnea.   Cardiovascular: Positive for leg swelling.  Gastrointestinal: Negative.   Endocrine: Negative.   Genitourinary: Positive for urgency.  Musculoskeletal: Positive for arthralgias, back pain and neck pain.  Skin: Negative.   Allergic/Immunologic: Negative.   Neurological: Positive for tremors, weakness and numbness.  Psychiatric/Behavioral: Positive for dysphoric mood.  All other systems reviewed and are negative.      Objective:   Physical Exam Vitals and nursing note reviewed.  Constitutional:      Appearance: Normal appearance.  Cardiovascular:     Rate and Rhythm: Normal rate and regular rhythm.     Pulses: Normal pulses.     Heart sounds: Normal heart sounds.  Pulmonary:     Effort: Pulmonary effort is normal.     Breath sounds: Normal breath sounds.  Musculoskeletal:     Cervical back: Normal range of motion and neck supple.      Comments: Normal Muscle Bulk and Muscle Testing Reveals:  Upper Extremities:Full ROM and Muscle Strength 5/5 Thoracic Paraspinal Tenderness: T-7-T-9 Lower Extremities: Full ROM and Muscle Strength 5/5 Arises from Table with ease Narrow Based  Gait   Skin:    General: Skin is warm and dry.  Neurological:     Mental Status: She is alert and oriented to person, place, and time.  Psychiatric:        Mood and Affect: Mood normal.        Behavior: Behavior normal.           Assessment & Plan:  1. Cervical postlaminectomy syndrome:/ Cervicalgia/ Cervical Radiculitis/with neck and shoulder pain/ Chronic Midline Thoracic Pain.11/12/2019.Continue exercise routine and using heat therapy. Refilled:current medication regimen withMS Contin 15 mg one tablet three times a day #90.  We will continue the opioid monitoring program, this consists of regular clinic visits, examinations, urine drug screen, pill counts as well as use of [place] Controlled Substance reporting System. 2. Fibromyalgia/ Neuropathy: Continuecurrent medication regimen withGabapentin, activity and exercise regime.11/12/2019. 3.Left Knee Pain/BilateralKnee Pain/ Mild Degenerative Changes: No complaints today. Continuecurrent medication regimen withVoltaren Gel.11/12/2019. 4.BilateralAnkle Pain:No complaints today.Continue HEP as tolerated. Continueto monitor.11/12/2019. 5. Muscle Spasm: Continuecurrent medication regimen withRobaxin.11/12/2019.. 6. Opioid Induced Constipation: No complaints Continue to Monitor.11/12/2019. 7. Midline Low Back Pain: Continue current medication regime, continue HEP as tolerated.11/12/2019. 8. Right Gluteal Medius Weakness and Atrophy: No complaints Today:Continue with Hip Abduction Exercisesas tolerated: Continue to Monitor.11/12/2019. 9. Right Hip Pain/ Right Greater Trochanteric Tenderness: No complaints Today. Continue to Alternate Ice and Heat Therapy. Continue  to Monitor.11/12/2019 10. Sacroiliac Joint Pain:No complaints today.S/PSacroiliac Injection on 08/24/2018 with good relief noted.11/12/2019 11.Lumbar Radiculitis: No complaints todayContinue Gabapentin.Continue HEP as tolerated. Continue current medication regimen. Continue to monitor.11/12/2019.  F/U in 1 month  15 minutes of  face to face patient care time was spent during this visit. All questions were encouraged and answered.

## 2019-11-13 NOTE — Telephone Encounter (Signed)
I called and spoke with [name] this morning asking if she finally got the supplies that she needed. She stated yes and had tried the chin strap out last night. The machine still showed that there was a leak.  I told her to try it for about a week to see if it improves and if no improvement to call the office back to ask for advice

## 2019-11-24 IMAGING — US US ABDOMEN LIMITED
1 series · 14 of 25 positions shown · non-contrast
Comparison: February 03, 2009

CLINICAL DATA: Increased LFTs, prior cholecystectomy

EXAM:
ULTRASOUND ABDOMEN LIMITED RIGHT UPPER QUADRANT

[Series 1: us abdomen limited · 14 of 35 slices shown]
[im 1/35]
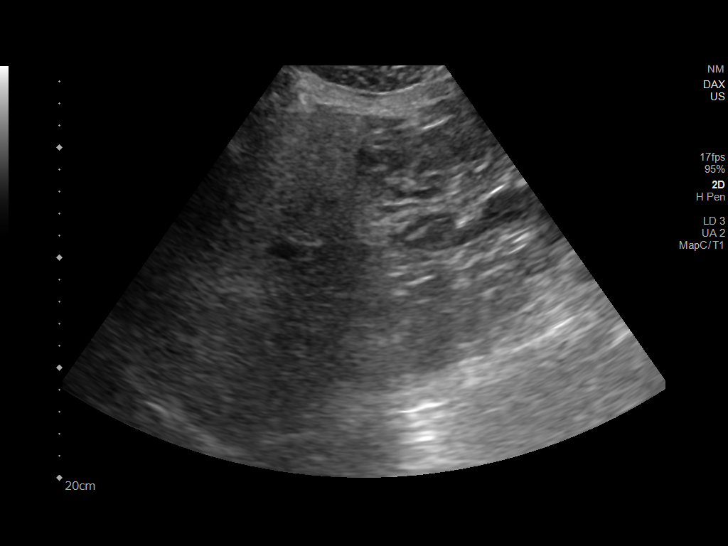
[im 3/35]
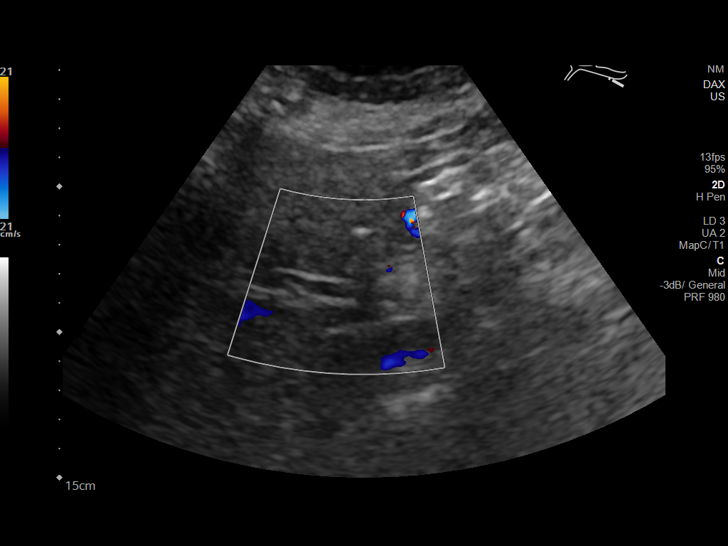
[im 6/35]
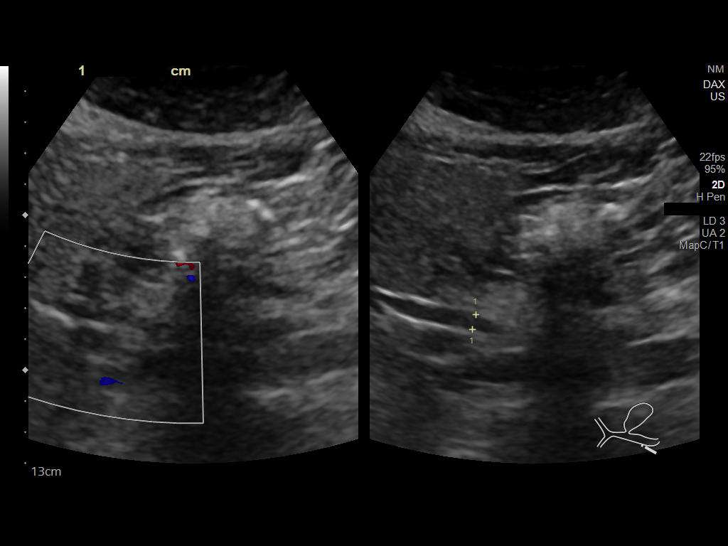
[im 9/35]
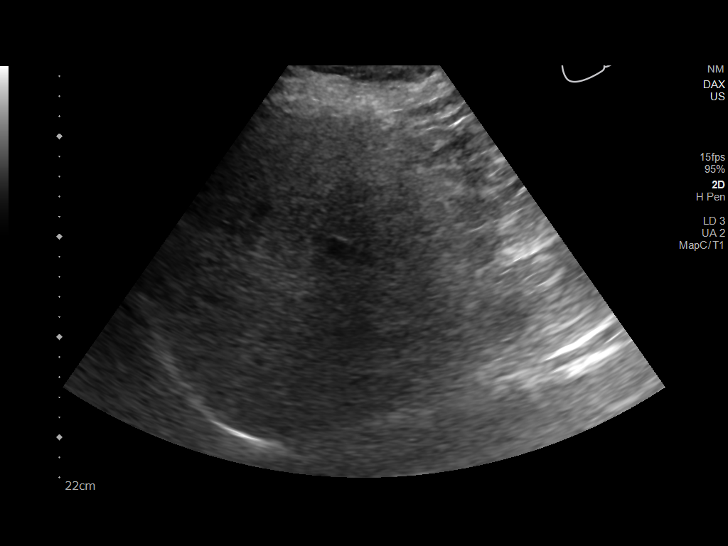
[im 12/35]
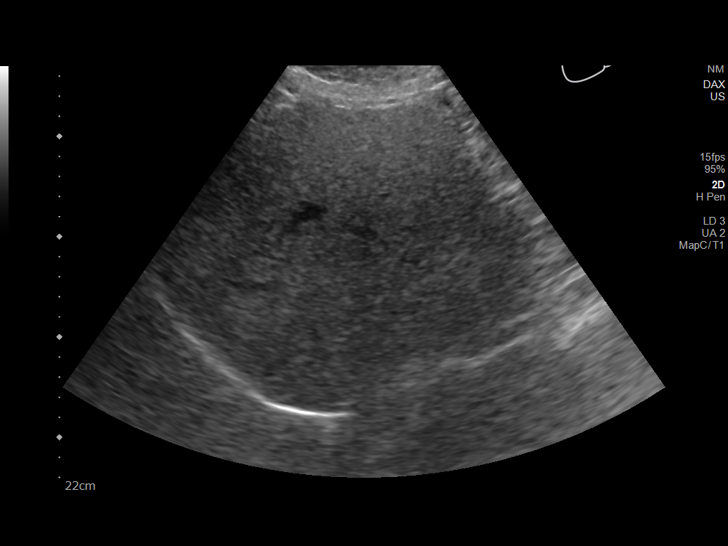
[im 13/35]
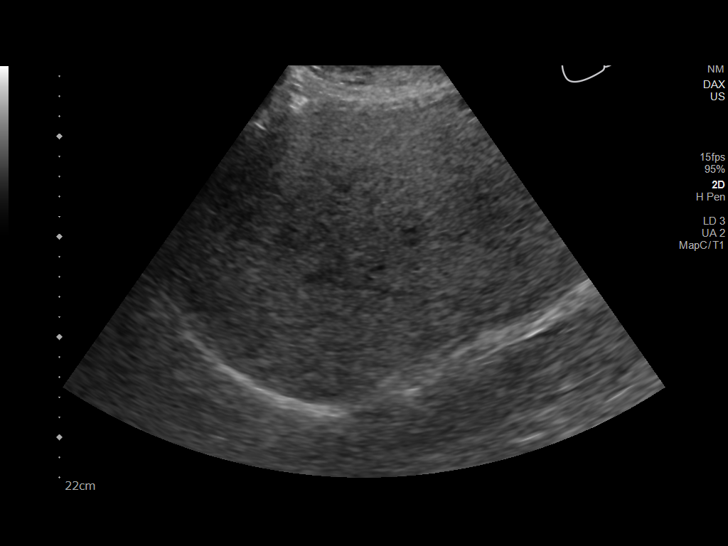
[im 16/35]
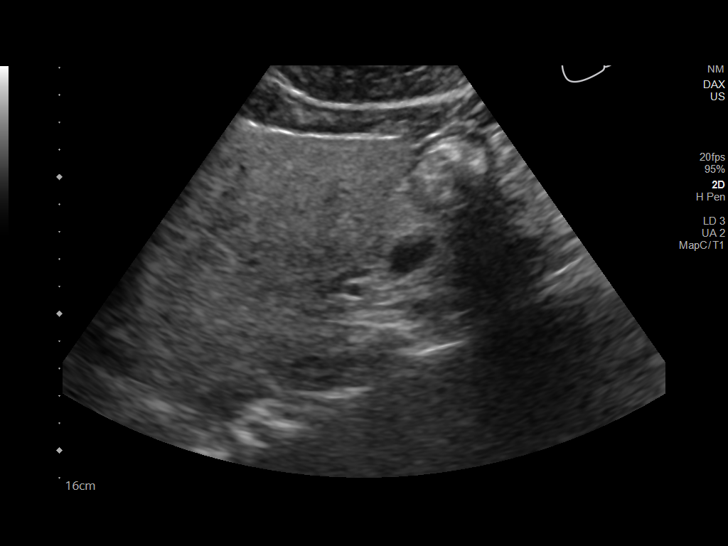
[im 19/35]
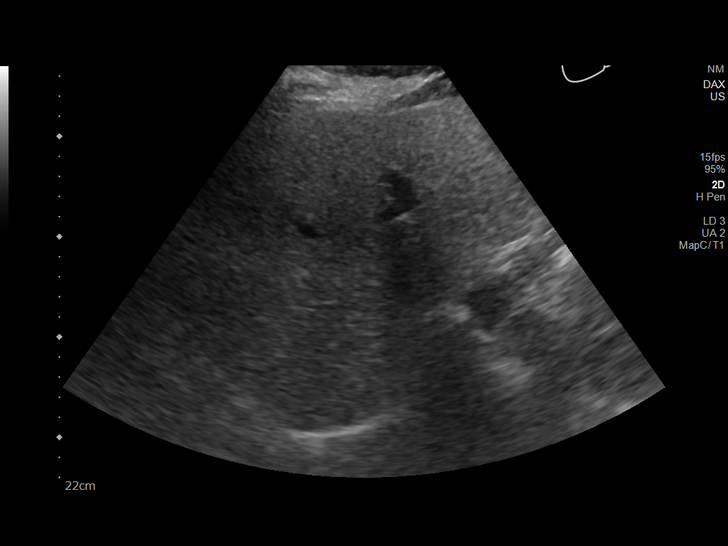
[im 22/35]
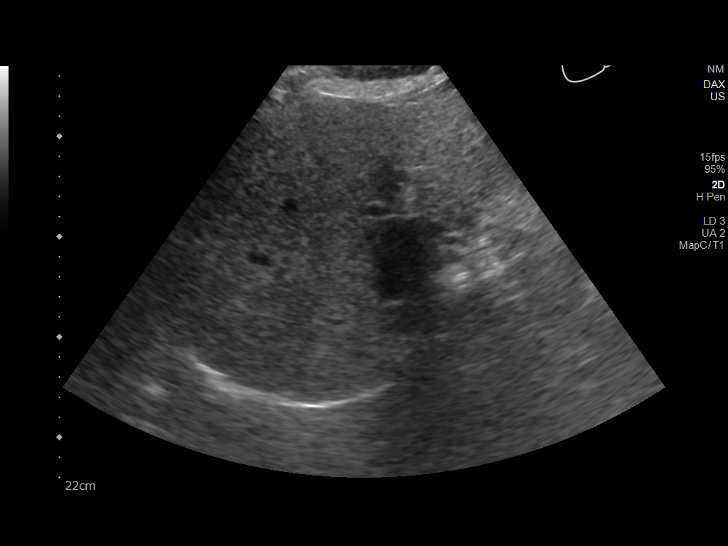
[im 23/35]
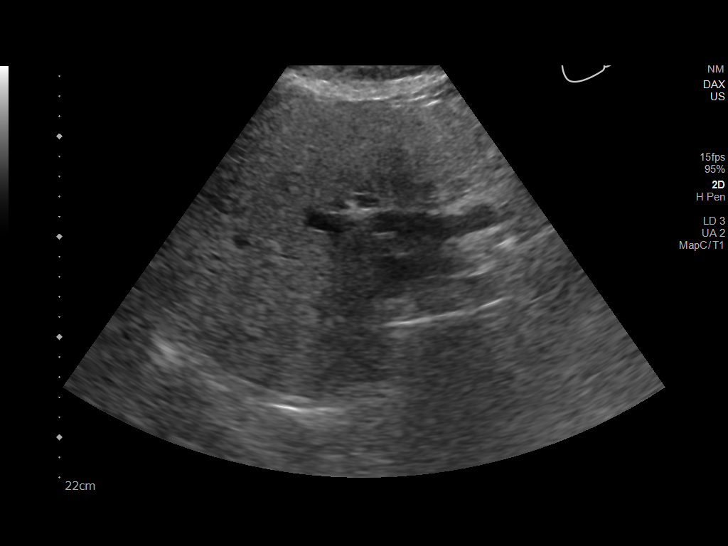
[im 26/35]
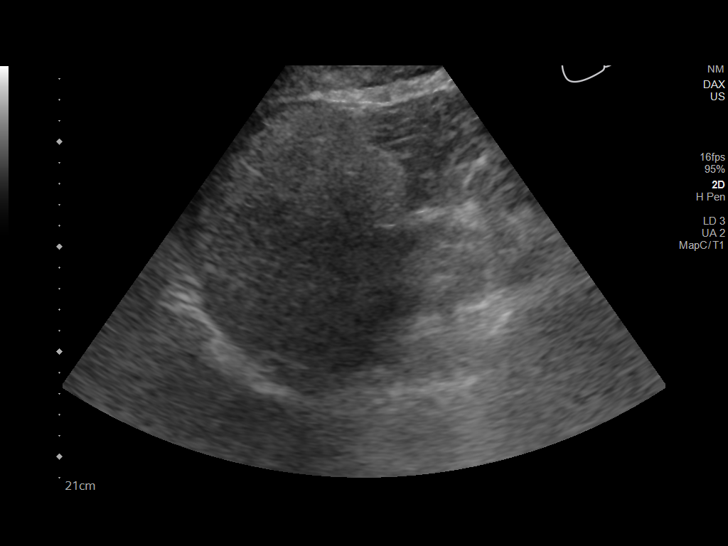
[im 29/35]
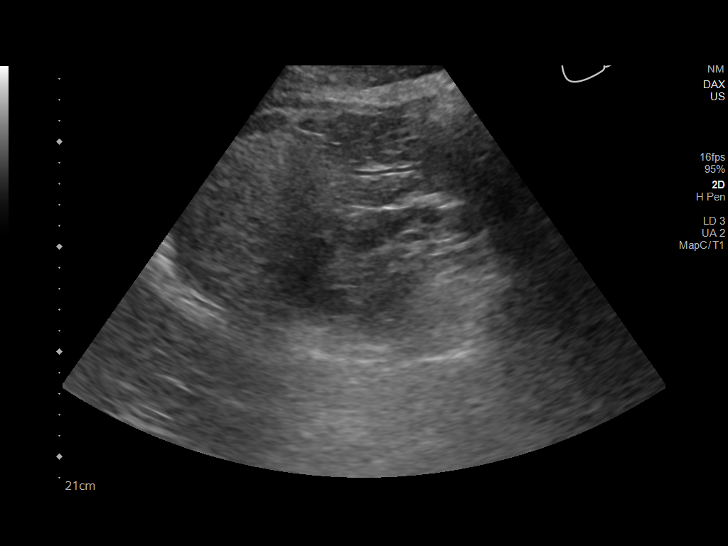
[im 32/35]
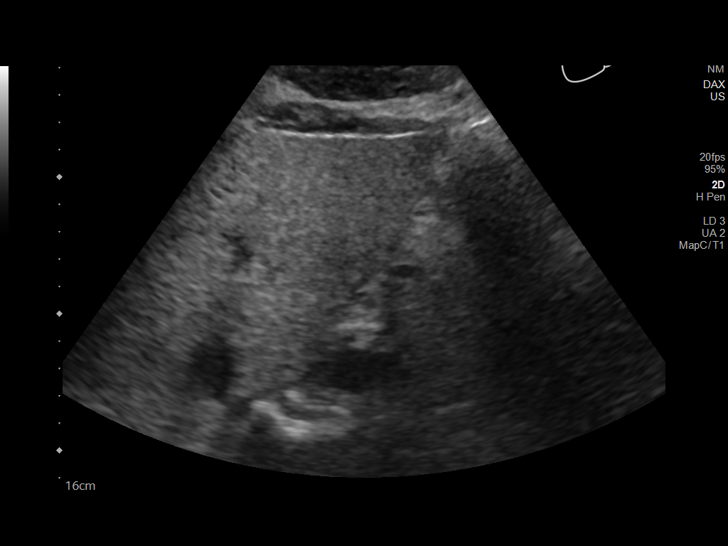
[im 35/35]
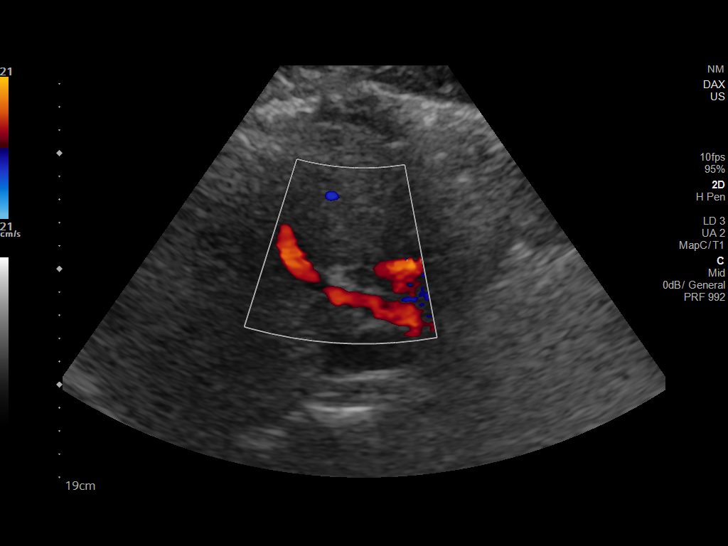

[14 of 25 positions shown; findings below may reference images not displayed]

FINDINGS: Gallbladder:

The patient is status post cholecystectomy. No biliary ductal
dilation.

Common bile duct:

Diameter: 5 mm

Liver:

No focal lesion identified. Increased echotexture seen throughout.
No focal abnormality or biliary ductal dilatation. Portal vein is
patent on color Doppler imaging with normal direction of blood flow
towards the liver.

Other: None.
IMPRESSION: Hepatic steatosis.

## 2019-12-07 ENCOUNTER — Other Ambulatory Visit: Payer: Self-pay | Admitting: Family Medicine

## 2019-12-10 DIAGNOSIS — J9610 Chronic respiratory failure, unspecified whether with hypoxia or hypercapnia: Secondary | ICD-10-CM | POA: Diagnosis not present

## 2019-12-10 DIAGNOSIS — G4733 Obstructive sleep apnea (adult) (pediatric): Secondary | ICD-10-CM | POA: Diagnosis not present

## 2019-12-18 ENCOUNTER — Encounter: Payer: Medicare Other | Attending: Physical Medicine & Rehabilitation | Admitting: Registered Nurse

## 2019-12-18 ENCOUNTER — Other Ambulatory Visit: Payer: Self-pay

## 2019-12-18 ENCOUNTER — Encounter: Payer: Self-pay | Admitting: Registered Nurse

## 2019-12-18 ENCOUNTER — Ambulatory Visit (HOSPITAL_COMMUNITY)
Admission: RE | Admit: 2019-12-18 | Discharge: 2019-12-18 | Disposition: A | Discharge status: Home / Self Care | Payer: Medicare Other | Source: Ambulatory Visit | Attending: Physician Assistant | Admitting: Physician Assistant

## 2019-12-18 VITALS — BP 134/68 | HR 70 | Ht 65.0 in | Wt 232.6 lb

## 2019-12-18 VITALS — BP 126/77 | HR 67 | Temp 97.3°F | Wt 231.6 lb

## 2019-12-18 DIAGNOSIS — G8929 Other chronic pain: Secondary | ICD-10-CM

## 2019-12-18 DIAGNOSIS — F329 Major depressive disorder, single episode, unspecified: Secondary | ICD-10-CM | POA: Diagnosis not present

## 2019-12-18 DIAGNOSIS — G894 Chronic pain syndrome: Secondary | ICD-10-CM | POA: Insufficient documentation

## 2019-12-18 DIAGNOSIS — M961 Postlaminectomy syndrome, not elsewhere classified: Secondary | ICD-10-CM

## 2019-12-18 DIAGNOSIS — Z888 Allergy status to other drugs, medicaments and biological substances status: Secondary | ICD-10-CM | POA: Diagnosis not present

## 2019-12-18 DIAGNOSIS — Z79891 Long term (current) use of opiate analgesic: Secondary | ICD-10-CM | POA: Insufficient documentation

## 2019-12-18 DIAGNOSIS — D6869 Other thrombophilia: Secondary | ICD-10-CM | POA: Insufficient documentation

## 2019-12-18 DIAGNOSIS — Z713 Dietary counseling and surveillance: Secondary | ICD-10-CM | POA: Insufficient documentation

## 2019-12-18 DIAGNOSIS — Z6838 Body mass index (BMI) 38.0-38.9, adult: Secondary | ICD-10-CM | POA: Insufficient documentation

## 2019-12-18 DIAGNOSIS — Z886 Allergy status to analgesic agent status: Secondary | ICD-10-CM | POA: Diagnosis not present

## 2019-12-18 DIAGNOSIS — I10 Essential (primary) hypertension: Secondary | ICD-10-CM | POA: Diagnosis not present

## 2019-12-18 DIAGNOSIS — I4892 Unspecified atrial flutter: Secondary | ICD-10-CM | POA: Insufficient documentation

## 2019-12-18 DIAGNOSIS — Z881 Allergy status to other antibiotic agents status: Secondary | ICD-10-CM | POA: Insufficient documentation

## 2019-12-18 DIAGNOSIS — Z833 Family history of diabetes mellitus: Secondary | ICD-10-CM | POA: Insufficient documentation

## 2019-12-18 DIAGNOSIS — M797 Fibromyalgia: Secondary | ICD-10-CM | POA: Insufficient documentation

## 2019-12-18 DIAGNOSIS — Z79899 Other long term (current) drug therapy: Secondary | ICD-10-CM | POA: Insufficient documentation

## 2019-12-18 DIAGNOSIS — Z5181 Encounter for therapeutic drug level monitoring: Secondary | ICD-10-CM | POA: Diagnosis not present

## 2019-12-18 DIAGNOSIS — M542 Cervicalgia: Secondary | ICD-10-CM

## 2019-12-18 DIAGNOSIS — M170 Bilateral primary osteoarthritis of knee: Secondary | ICD-10-CM

## 2019-12-18 DIAGNOSIS — E6609 Other obesity due to excess calories: Secondary | ICD-10-CM | POA: Insufficient documentation

## 2019-12-18 DIAGNOSIS — M546 Pain in thoracic spine: Secondary | ICD-10-CM

## 2019-12-18 DIAGNOSIS — Z79890 Hormone replacement therapy (postmenopausal): Secondary | ICD-10-CM | POA: Diagnosis not present

## 2019-12-18 DIAGNOSIS — E785 Hyperlipidemia, unspecified: Secondary | ICD-10-CM | POA: Insufficient documentation

## 2019-12-18 DIAGNOSIS — E039 Hypothyroidism, unspecified: Secondary | ICD-10-CM | POA: Diagnosis not present

## 2019-12-18 DIAGNOSIS — I480 Paroxysmal atrial fibrillation: Secondary | ICD-10-CM | POA: Insufficient documentation

## 2019-12-18 DIAGNOSIS — Z9989 Dependence on other enabling machines and devices: Secondary | ICD-10-CM | POA: Diagnosis not present

## 2019-12-18 DIAGNOSIS — G4733 Obstructive sleep apnea (adult) (pediatric): Secondary | ICD-10-CM | POA: Diagnosis not present

## 2019-12-18 DIAGNOSIS — Z7901 Long term (current) use of anticoagulants: Secondary | ICD-10-CM | POA: Insufficient documentation

## 2019-12-18 DIAGNOSIS — E119 Type 2 diabetes mellitus without complications: Secondary | ICD-10-CM | POA: Insufficient documentation

## 2019-12-18 DIAGNOSIS — R0781 Pleurodynia: Secondary | ICD-10-CM | POA: Diagnosis not present

## 2019-12-18 DIAGNOSIS — M25551 Pain in right hip: Secondary | ICD-10-CM | POA: Insufficient documentation

## 2019-12-18 MED ORDER — MORPHINE SULFATE ER 15 MG PO TBCR: 15.0000 mg | EXTENDED_RELEASE_TABLET | Freq: Three times a day (TID) | ORAL | 0 refills | Status: DC

## 2019-12-18 NOTE — Progress Notes (Signed)
Subjective:    Patient ID: [name], female    DOB: 01/25/1951, 69 y.o.   MRN: [id number]  HPI: [name] is a 69 y.o. female who returns for follow up appointment for chronic pain and medication refill. She states her pain is located in her neck, middle back and bilateral knees. She rates her pain 5. Her current exercise regime is walking and performing stretching exercises.  [name] Morphine equivalent is 45.00 MME.  Last UDS was Performed on 10/09/2019, it was consistent.    Pain Inventory Average Pain 5 Pain Right Now 5 My pain is constant, burning and aching  In the last 24 hours, has pain interfered with the following? General activity 5 Relation with others 4 Enjoyment of life 4 What TIME of day is your pain at its worst? Evening & night. Sleep (in general) Fair  Pain is worse with: walking, bending, standing and some activites Pain improves with: rest, heat/ice, pacing activities and medication Relief from Meds: 5  Mobility walk without assistance how many minutes can you walk? 10-15 mins ability to climb steps?  yes do you drive?  yes Do you have any goals in this area?  yes  Function retired I need assistance with the following:  meal prep, household duties and shopping Do you have any goals in this area?  yes  Neuro/Psych bladder control problems weakness numbness tremor depression  Prior Studies Any changes since last visit?  no  Physicians involved in your care Any changes since last visit?  no   Family History  Problem Relation Age of Onset  . Breast cancer Mother   . Diabetes Mother   . Heart disease Mother   . Allergies Mother   . Diabetes Father   . Heart disease Father   . Emphysema Father   . Allergies Father   . Stomach cancer Maternal Uncle   . Breast cancer Paternal Aunt   . Colon cancer Neg Hx    Social History   Socioeconomic History  . Marital status: Married    Spouse name: Not on file  . Number of  children: Not on file  . Years of education: Not on file  . Highest education level: Not on file  Occupational History  . Occupation: Radiation protection practitioner  . Occupation: retired  Tobacco Use  . Smoking status: Never Smoker  . Smokeless tobacco: Never Used  Vaping Use  . Vaping Use: Never used  Substance and Sexual Activity  . Alcohol use: Yes    Alcohol/week: 1.0 standard drink    Types: 1 Glasses of wine per week    Comment: rare  . Drug use: Never  . Sexual activity: Not Currently  Other Topics Concern  . Not on file  Social History Narrative  . Not on file   Social Determinants of Health   Financial Resource Strain: Low Risk   . Difficulty of Paying Living Expenses: Not hard at all  Food Insecurity: No Food Insecurity  . Worried About Charity fundraiser in the Last Year: Never true  . Ran Out of Food in the Last Year: Never true  Transportation Needs: No Transportation Needs  . Lack of Transportation (Medical): No  . Lack of Transportation (Non-Medical): No  Physical Activity: Inactive  . Days of Exercise per Week: 0 days  . Minutes of Exercise per Session: 0 min  Stress: No Stress Concern Present  . Feeling of Stress : Not at all  Social Connections:   .  Frequency of Communication with Friends and Family:   . Frequency of Social Gatherings with Friends and Family:   . Attends Religious Services:   . Active Member of Clubs or Organizations:   . Attends Archivist Meetings:   Marland Kitchen Marital Status:    Past Surgical History:  Procedure Laterality Date  . [place] STUDY N/A 01/04/2016   Procedure: [place] STUDY;  Surgeon: Manus Gunning, MD;  Location: [facility];  Service: Gastroenterology;  Laterality: N/A;  . ABDOMINAL HYSTERECTOMY  1988  . ADENOIDECTOMY    . ANTERIOR CERVICAL DECOMP/DISCECTOMY FUSION  2009, 1993   first procedure in ~ 1993/Dr Botero  . APPENDECTOMY  1988  . BACK SURGERY    . CARDIOVERSION N/A 04/03/2019   Procedure: CARDIOVERSION;   Surgeon: Dorothy Spark, MD;  Location: [facility];  Service: Cardiovascular;  Laterality: N/A;  . CHOLECYSTECTOMY  2005  . ESOPHAGEAL MANOMETRY N/A 01/04/2016   Procedure: ESOPHAGEAL MANOMETRY (EM) 24 HR PH;  Surgeon: Manus Gunning, MD;  Location: [facility];  Service: Gastroenterology;  Laterality: N/A;  . ESOPHAGOGASTRODUODENOSCOPY (EGD) WITH PROPOFOL N/A 05/17/2016   Procedure: ESOPHAGOGASTRODUODENOSCOPY (EGD) WITH PROPOFOL;  Surgeon: Manus Gunning, MD;  Location: [facility];  Service: Gastroenterology;  Laterality: N/A;  . NASAL SINUS SURGERY  2006  . SAVORY DILATION N/A 05/17/2016   Procedure: SAVORY DILATION;  Surgeon: Manus Gunning, MD;  Location: [facility];  Service: Gastroenterology;  Laterality: N/A;  . TEE WITHOUT CARDIOVERSION N/A 04/03/2019   Procedure: TRANSESOPHAGEAL ECHOCARDIOGRAM (TEE)-with DCCV;  Surgeon: Dorothy Spark, MD;  Location: [facility] ENDOSCOPY;; EF 55 to 60%.  No LVH.  Normal RV.  Moderate LA dilation.  No thrombus.  Mild RA dilation.  Mild to moderate TR.  Mild aortic valve sclerosis, no stenosis.  Moderately elevated PA pressures (47 mmHg)  . TONSILLECTOMY AND ADENOIDECTOMY    . TRANSTHORACIC ECHOCARDIOGRAM  2009   Normal LV size and function. EF 60-65%. No regional wall motion and amount is. Mild aortic sclerosis, no stenosis  . TUBAL LIGATION  1979   Past Medical History:  Diagnosis Date  . Allergy   . Anemia   . Arthritis   . B12 deficiency   . Complication of anesthesia    slow to wake up   . Depression   . Diabetes mellitus type 2, diet-controlled ([facility]) 07/24/2018  . Fibromyalgia   . Generalized hyperhidrosis 04/17/2014  . GERD (gastroesophageal reflux disease)   . Hyperlipemia   . HYPOTHYROIDISM 05/29/2007  . Irritable bowel syndrome   . Migraines    hx of   . OSA (obstructive sleep apnea) 06/26/2019  . Oxygen deficiency    2 L at bedtime  . PVC (premature ventricular contraction)   . Senile calcific aortic  valve sclerosis    Aortic Sclerosis-no stenosis  . Unspecified essential hypertension    denies htn on 05/09/16   BP 126/77   Pulse 67   Temp (!) 97.3 F (36.3 C)   Wt 231 lb 9.6 oz (105.1 kg)   SpO2 98%   BMI 38.54 kg/m   Opioid Risk Score:   Fall Risk Score:  `1  Depression screen PHQ 2/9  Depression screen [facility] 2/9 06/19/2019 01/18/2019 08/08/2018 04/24/2018 01/10/2018 11/24/2017 03/27/2017  Decreased Interest 1 0 0 0 2 1 0  Down, Depressed, Hopeless 1 0 0 0 0 1 0  PHQ - 2 Score 2 0 0 0 2 2 0  Altered sleeping - 0 - -  0 - -  Tired, decreased energy - 3 - - 1 - -  Change in appetite - 0 - - 0 - -  Feeling bad or failure about yourself  - 0 - - 0 - -  Trouble concentrating - 0 - - 1 - -  Moving slowly or fidgety/restless - 0 - - 0 - -  Suicidal thoughts - 0 - - 0 - -  PHQ-9 Score - 3 - - 4 - -  Difficult doing work/chores - Not difficult at all - - Not difficult at all Not difficult at all -  Some recent data might be hidden   Review of Systems  Constitutional: Negative.   HENT: Negative.   Eyes: Negative.   Respiratory: Negative.   Cardiovascular: Positive for leg swelling.  Gastrointestinal: Negative.   Endocrine: Negative.        Sweats  Genitourinary: Positive for urgency.  Musculoskeletal: Positive for arthralgias, back pain, gait problem and neck stiffness.  Skin: Negative.   Allergic/Immunologic: Negative.   Hematological: Bruises/bleeds easily.  Psychiatric/Behavioral:       Depression (being treated)       Objective:   Physical Exam Vitals and nursing note reviewed.  Constitutional:      Appearance: Normal appearance.  Neck:     Comments: Cervical Paraspinal Tenderness: C-5-C-6 Cardiovascular:     Rate and Rhythm: Normal rate and regular rhythm.     Pulses: Normal pulses.     Heart sounds: Normal heart sounds.  Pulmonary:     Effort: Pulmonary effort is normal.     Breath sounds: Normal breath sounds.  Musculoskeletal:     Cervical back: Normal  range of motion and neck supple.     Comments: Normal Muscle Bulk and Muscle Testing Reveals:  Upper Extremities: Full ROM and Muscle Strength 5/5 Thoracic Paraspinal Tenderness: T-7-T-9 Lower Extremities: Full ROM and Muscle Strength 5/5 Arises from Table with ease Narrow Based  Gait   Skin:    General: Skin is warm and dry.  Neurological:     Mental Status: She is alert and oriented to person, place, and time.  Psychiatric:        Mood and Affect: Mood normal.        Behavior: Behavior normal.           Assessment & Plan:  1. Cervical postlaminectomy syndrome:/ Cervicalgia/ Cervical Radiculitis/with neck and shoulder pain/ Chronic Midline Thoracic Pain.Continue exercise routine and using heat therapy.12/18/2019 Refilled:current medication regimen withMS Contin 15 mg one tablet three times a day #90.  We will continue the opioid monitoring program, this consists of regular clinic visits, examinations, urine drug screen, pill counts as well as use of [place] Controlled Substance reporting System. 2. Fibromyalgia/ Neuropathy: Continuecurrent medication regimen withGabapentin, activity and exercise regime.12/18/2019. 3.Left Knee Pain/BilateralKnee Pain/ Mild Degenerative Changes:Continuecurrent medication regimen withVoltaren Gel.12/18/2019. 4.BilateralAnkle Pain:No complaints today.Continue HEP as tolerated. Continueto monitor.12/18/2019. 5. Muscle Spasm: Continuecurrent medication regimen withRobaxin.12/18/2019.. 6. Opioid Induced Constipation: No complaints Continue to Monitor.12/18/2019. 7. Midline Low Back Pain: Continue current medication regime, continue HEP as tolerated.12/18/2019. 8. Right Gluteal Medius Weakness and Atrophy: No complaints Today:Continue with Hip Abduction Exercisesas tolerated: Continue to Monitor.12/18/2019. 9. RightHip Pain/ Right Greater Trochanteric Tenderness: No complaints Today. Continue to Alternate Ice and Heat  Therapy. Continue to Monitor.12/18/2019 10. Sacroiliac Joint Pain:No complaints today.S/PSacroiliac Injection on 08/24/2018 with good relief noted.12/18/2019 11.Lumbar Radiculitis:No complaints todayContinue Gabapentin.Continue HEP as tolerated. Continue current medication regimen. Continue to monitor.12/18/2019.  F/U in 1  month  20  minutes of face to face patient care time was spent during this visit. All questions were encouraged and answered.

## 2019-12-18 NOTE — Progress Notes (Signed)
Primary Care Physician: Owens Loffler, MD Primary Cardiologist: Dr Ellyn Hack Primary Electrophysiologist: none Referring Physician: Dr Bary Leriche is a 69 y.o. female with a history of fibromyalgia, DM, atrial flutter, HTN, OSA, paroxysmal atrial fibrillation who presents for follow up in the [facility].  The patient was initially diagnosed with atrial fibrillation 04/01/19 after presenting with one week of fatigue and weakness. She underwent TEE/DCCV on 04/03/19. Patient is on Eliquis for a CHADS2VASC score of 4. She reports that she has done well since then until the night of 08/28/19 when she noted heart racing symptoms with some lightheaded. When she got up the next morning and check her home ECG device she was in afib with RVR. There were no specific triggers that the patient could identify however, she did have a COVID vaccine two weeks prior. She is on BiPap for OSA. She denies any significant alcohol use. Patient presented for DCCV on 09/09/19 in [place] and the procedure was cancelled. She reports that she feels much better in SR with more energy, less SOB, no heart racing or dizziness.   On follow up today, patient reports that she has done well since her last visit. She denies any heart racing or palpitations. She is tolerating the medication without difficulty. She denies bleeding issues on anticoagulation.   Today, she denies symptoms of palpitations, chest pain, shortness of breath, orthopnea, PND, lower extremity edema, presyncope, syncope, bleeding, or neurologic sequela. The patient is tolerating medications without difficulties and is otherwise without complaint today.    Atrial Fibrillation Risk Factors:  she does have symptoms or diagnosis of sleep apnea. she is compliant with BiPAP therapy. she does not have a history of rheumatic fever. she does not have a history of alcohol use. The patient does not have a history of early familial  atrial fibrillation or other arrhythmias.  she has a BMI of Body mass index is 38.71 kg/m.Marland Kitchen Filed Weights   12/18/19 1404  Weight: 105.5 kg    Family History  Problem Relation Age of Onset  . Breast cancer Mother   . Diabetes Mother   . Heart disease Mother   . Allergies Mother   . Diabetes Father   . Heart disease Father   . Emphysema Father   . Allergies Father   . Stomach cancer Maternal Uncle   . Breast cancer Paternal Aunt   . Colon cancer Neg Hx      Atrial Fibrillation Management history:  Previous antiarrhythmic drugs: flecainide Previous cardioversions: 04/03/19 Previous ablations: none CHADS2VASC score: 4 Anticoagulation history: Eliquis   Past Medical History:  Diagnosis Date  . Allergy   . Anemia   . Arthritis   . B12 deficiency   . Complication of anesthesia    slow to wake up   . Depression   . Diabetes mellitus type 2, diet-controlled ([place]) 07/24/2018  . Fibromyalgia   . Generalized hyperhidrosis 04/17/2014  . GERD (gastroesophageal reflux disease)   . Hyperlipemia   . HYPOTHYROIDISM 05/29/2007  . Irritable bowel syndrome   . Migraines    hx of   . OSA (obstructive sleep apnea) 06/26/2019  . Oxygen deficiency    2 L at bedtime  . PVC (premature ventricular contraction)   . Senile calcific aortic valve sclerosis    Aortic Sclerosis-no stenosis  . Unspecified essential hypertension    denies htn on 05/09/16   Past Surgical History:  Procedure Laterality Date  . 24 HOUR  [place] STUDY N/A 01/04/2016   Procedure: 24 HOUR PH STUDY;  Surgeon: Manus Gunning, MD;  Location: [facility] ENDOSCOPY;  Service: Gastroenterology;  Laterality: N/A;  . ABDOMINAL HYSTERECTOMY  1988  . ADENOIDECTOMY    . ANTERIOR CERVICAL DECOMP/DISCECTOMY FUSION  2009, 1993   first procedure in ~ 1993/Dr Botero  . APPENDECTOMY  1988  . BACK SURGERY    . CARDIOVERSION N/A 04/03/2019   Procedure: CARDIOVERSION;  Surgeon: Dorothy Spark, MD;  Location: [facility] ENDOSCOPY;   Service: Cardiovascular;  Laterality: N/A;  . CHOLECYSTECTOMY  2005  . ESOPHAGEAL MANOMETRY N/A 01/04/2016   Procedure: ESOPHAGEAL MANOMETRY (EM) 24 HR PH;  Surgeon: Manus Gunning, MD;  Location: [facility] ENDOSCOPY;  Service: Gastroenterology;  Laterality: N/A;  . ESOPHAGOGASTRODUODENOSCOPY (EGD) WITH PROPOFOL N/A 05/17/2016   Procedure: ESOPHAGOGASTRODUODENOSCOPY (EGD) WITH PROPOFOL;  Surgeon: Manus Gunning, MD;  Location: [facility] ENDOSCOPY;  Service: Gastroenterology;  Laterality: N/A;  . NASAL SINUS SURGERY  2006  . SAVORY DILATION N/A 05/17/2016   Procedure: SAVORY DILATION;  Surgeon: Manus Gunning, MD;  Location: [facility] ENDOSCOPY;  Service: Gastroenterology;  Laterality: N/A;  . TEE WITHOUT CARDIOVERSION N/A 04/03/2019   Procedure: TRANSESOPHAGEAL ECHOCARDIOGRAM (TEE)-with DCCV;  Surgeon: Dorothy Spark, MD;  Location: [facility] ENDOSCOPY;; EF 55 to 60%.  No LVH.  Normal RV.  Moderate LA dilation.  No thrombus.  Mild RA dilation.  Mild to moderate TR.  Mild aortic valve sclerosis, no stenosis.  Moderately elevated PA pressures (47 mmHg)  . TONSILLECTOMY AND ADENOIDECTOMY    . TRANSTHORACIC ECHOCARDIOGRAM  2009   Normal LV size and function. EF 60-65%. No regional wall motion and amount is. Mild aortic sclerosis, no stenosis  . TUBAL LIGATION  1979    Current Outpatient Medications  Medication Sig Dispense Refill  . acetaminophen (TYLENOL) 650 MG CR tablet Take 650 mg by mouth in the morning and at bedtime.     . B Complex Vitamins (B COMPLEX 100 PO) Take 1 tablet by mouth every morning.     . bisacodyl (DULCOLAX) 5 MG EC tablet Take 10 mg by mouth at bedtime.     . calcium carbonate (TUMS EX) 750 MG chewable tablet Chew 1-2 tablets by mouth 2 (two) times daily as needed for heartburn (acid reflux).    . Calcium Carbonate-Vitamin D (CALCIUM 600+D PO) Take 1 tablet by mouth in the morning and at bedtime.     . carboxymethylcellulose (REFRESH PLUS) 0.5 % SOLN Place 1 drop into both  eyes 3 (three) times daily as needed (dry eyes).     . cetirizine (ZYRTEC) 10 MG tablet Take 10 mg by mouth at bedtime.     . Cholecalciferol (VITAMIN D) 125 MCG (5000 UT) CAPS Take 5,000 Units by mouth every morning.     . clindamycin (CLEOCIN T) 1 % lotion Apply 1 application topically daily as needed (eczema).     . Coenzyme Q-10 100 MG capsule Take 100 mg by mouth every morning.     . diclofenac sodium (VOLTAREN) 1 % GEL Apply 4 g topically 4 (four) times daily as needed (joint pain).     Marland Kitchen diltiazem (CARDIZEM CD) 240 MG 24 hr capsule TAKE 1 CAPSULE BY MOUTH  DAILY 90 capsule 1  . docusate sodium (COLACE) 250 MG capsule Take 250 mg by mouth at bedtime.    Marland Kitchen ELIQUIS 5 MG TABS tablet TAKE 1 TABLET BY MOUTH  TWICE DAILY 180 tablet 3  . flecainide (TAMBOCOR) 50 MG tablet  Take 1 tablet (50 mg total) by mouth 2 (two) times daily. 180 tablet 1  . fluocinonide (LIDEX) 0.05 % external solution Apply 1 application topically daily as needed (eczema on scalp).     . gabapentin (NEURONTIN) 300 MG capsule TAKE 1 CAPSULE BY MOUTH 3  TIMES DAILY 270 capsule 3  . GRAPE SEED EXTRACT PO Take 100 mg by mouth 2 (two) times daily.     Marland Kitchen levothyroxine (SYNTHROID) 50 MCG tablet TAKE 1 TABLET BY MOUTH  DAILY 90 tablet 3  . loratadine (CLARITIN) 10 MG tablet Take 10 mg by mouth every morning.     . Magnesium Bisglycinate (MAG GLYCINATE PO) Take 400 mg by mouth at bedtime.     . methocarbamol (ROBAXIN) 500 MG tablet TAKE ONE (1) TABLET BY MOUTH EVERY EIGHTHOURS AS NEEDED FOR MUSCLE SPASMS 270 tablet 3  . mirtazapine (REMERON) 15 MG tablet Take 1 tablet (15 mg total) by mouth at bedtime.    Marland Kitchen morphine (MS CONTIN) 15 MG 12 hr tablet Take 1 tablet (15 mg total) by mouth 3 (three) times daily. 90 tablet 0  . nitroGLYCERIN (NITROSTAT) 0.4 MG SL tablet Place 1 tablet (0.4 mg total) under the tongue every 5 (five) minutes as needed for chest pain. 90 tablet 3  . oxybutynin (DITROPAN-XL) 5 MG 24 hr tablet Take 1 tablet (5 mg  total) by mouth at bedtime. 90 tablet 1  . oxymetazoline (AFRIN) 0.05 % nasal spray Place 1 spray into both nostrils 2 (two) times daily as needed for congestion.    . RABEprazole (ACIPHEX) 20 MG tablet Take 1 tablet (20 mg total) by mouth daily. (Patient taking differently: Take 20 mg by mouth at bedtime. ) 90 tablet 3  . tretinoin (RETIN-A) 0.05 % cream Apply 1 application topically daily as needed (acne).     . vitamin B-12 (CYANOCOBALAMIN) 1000 MCG tablet Take 1,000 mcg by mouth daily.     No current facility-administered medications for this encounter.    Allergies  Allergen Reactions  . Cyclobenzaprine Hcl Anaphylaxis and Swelling    Reaction - throat closed, tongue swelling  . Rizatriptan Benzoate Anaphylaxis    Throat and Tongue Swelling Closed  . Sumatriptan Swelling    Throat closed & Tongue Swelling  . Tizanidine Shortness Of Breath  . Aleve [Naproxen Sodium] Hives  . Clarithromycin Hives  . Doxycycline Diarrhea and Nausea And Vomiting  . Duloxetine Nausea Only  . Egg [Eggs Or Egg-Derived Products] Diarrhea and Nausea And Vomiting  . Keflex [Cephalexin] Itching and Other (See Comments)    Sores  . Metronidazole Diarrhea and Nausea Only  . Oxycodone Hcl Diarrhea and Nausea And Vomiting  . Oxycontin [Oxycodone Hcl] Nausea And Vomiting  . Penicillins Other (See Comments)    Unknown reaction Did it involve swelling of the face/tongue/throat, SOB, or low BP? Unknown Did it involve sudden or severe rash/hives, skin peeling, or any reaction on the inside of your mouth or nose? Unknown Did you need to seek medical attention at a hospital or doctor's office? Unknown When did it last happen?infant If all above answers are "NO", may proceed with cephalosporin use.  . Rosuvastatin Other (See Comments)    Muscle aches  . Venlafaxine Hives  . Voltaren [Diclofenac Sodium] Hives    Reaction to oral med, uses gel occasionally  . Septra [Sulfamethoxazole-Trimethoprim] Itching  and Rash  . Tramadol Diarrhea, Nausea And Vomiting and Palpitations    Social History   Socioeconomic History  . Marital  status: Married    Spouse name: Not on file  . Number of children: Not on file  . Years of education: Not on file  . Highest education level: Not on file  Occupational History  . Occupation: Radiation protection practitioner  . Occupation: retired  Tobacco Use  . Smoking status: Never Smoker  . Smokeless tobacco: Never Used  Vaping Use  . Vaping Use: Never used  Substance and Sexual Activity  . Alcohol use: Yes    Alcohol/week: 1.0 standard drink    Types: 1 Glasses of wine per week    Comment: rare  . Drug use: Never  . Sexual activity: Not Currently  Other Topics Concern  . Not on file  Social History Narrative  . Not on file   Social Determinants of Health   Financial Resource Strain: Low Risk   . Difficulty of Paying Living Expenses: Not hard at all  Food Insecurity: No Food Insecurity  . Worried About Charity fundraiser in the Last Year: Never true  . Ran Out of Food in the Last Year: Never true  Transportation Needs: No Transportation Needs  . Lack of Transportation (Medical): No  . Lack of Transportation (Non-Medical): No  Physical Activity: Inactive  . Days of Exercise per Week: 0 days  . Minutes of Exercise per Session: 0 min  Stress: No Stress Concern Present  . Feeling of Stress : Not at all  Social Connections:   . Frequency of Communication with Friends and Family:   . Frequency of Social Gatherings with Friends and Family:   . Attends Religious Services:   . Active Member of Clubs or Organizations:   . Attends Archivist Meetings:   Marland Kitchen Marital Status:   Intimate Partner Violence: Not At Risk  . Fear of Current or Ex-Partner: No  . Emotionally Abused: No  . Physically Abused: No  . Sexually Abused: No     ROS- All systems are reviewed and negative except as per the HPI above.  Physical Exam: Vitals:   12/18/19 1404  BP: 134/68    Pulse: 70  Weight: 105.5 kg  Height: 5\' 5"  (1.651 m)    GEN- The patient is well appearing obese female, alert and oriented x 3 today.   HEENT-head normocephalic, atraumatic, sclera clear, conjunctiva pink, hearing intact, trachea midline. Lungs- Clear to ausculation bilaterally, normal work of breathing Heart- Regular rate and rhythm, no murmurs, rubs or gallops  GI- soft, NT, ND, + BS Extremities- no clubbing, cyanosis, or edema MS- no significant deformity or atrophy Skin- no rash or lesion Psych- euthymic mood, full affect Neuro- strength and sensation are intact   Wt Readings from Last 3 Encounters:  12/18/19 105.5 kg  11/12/19 105.2 kg  10/17/19 106.3 kg    EKG today demonstrates SR HR 70, PR 166, QRS 86, QTc 408  TEE 04/03/19 demonstrated  1. Left ventricular ejection fraction, by visual estimation, is 55 to  60%. The left ventricle has normal function. Normal left ventricular size.  There is no left ventricular hypertrophy.  2. Global right ventricle has normal systolic function.The right  ventricular size is normal. No increase in right ventricular wall  thickness.  3. Left atrial size was moderately dilated.  4. No thrombus is seen in the left atrium or left atrial appendage.  5. Right atrial size was mildly dilated.  6. The mitral valve is normal in structure. Mild mitral valve  regurgitation. No evidence of mitral stenosis.  7. The tricuspid  valve is normal in structure. Tricuspid valve  regurgitation mild-moderate.  8. The aortic valve is normal in structure. Aortic valve regurgitation is  mild by color flow Doppler. Mild aortic valve sclerosis without stenosis.  9. The pulmonic valve was normal in structure. Pulmonic valve  regurgitation is not visualized by color flow Doppler.  10. Moderately elevated pulmonary artery systolic pressure.  11. The tricuspid regurgitant velocity is 3.12 m/s, and with an assumed  right atrial pressure of 8 mmHg, the  estimated right ventricular systolic  pressure is moderately elevated at 46.9 mmHg.  12. The inferior vena cava is normal in size with greater than 50%  respiratory variability, suggesting right atrial pressure of 3 mmHg.  13. No thrombus is seen in the left atrium or left atrial appendage.  14. This TEE was followed by a successful cardioversion.   Epic records are reviewed at length today  CHA2DS2-VASc Score = 4 The patient's score is based upon: CHF History: No HTN History: Yes Age : 90-74 Diabetes History: Yes Stroke History: No Vascular Disease History: No Gender: Female   ASSESSMENT AND PLAN: 1. Paroxysmal Atrial Fibrillation/typical atrial flutter The patient's CHA2DS2-VASc score is 4, indicating a 4.8% annual risk of stroke.   Patient appears to be maintaining SR. Continue flecainide 50 mg BID Continue diltiazem 240 mg daily  Continue Eliquis 5 mg BID  2. Secondary Hypercoagulable State (ICD10:  D68.69) The patient is at significant risk for stroke/thromboembolism based upon her CHA2DS2-VASc Score of 4.  Continue Apixaban (Eliquis).   3. Obesity Body mass index is 38.71 kg/m. Lifestyle modification was discussed and encouraged including regular physical activity and weight reduction.  4. OSA Patient reports compliance with BiPap therapy.  5. HTN Stable, no changes today.   Follow up in the AF clinic in 4 months.    [facility] [address] [place], [place] [address] [phone] 12/18/2019 2:22 PM

## 2019-12-23 ENCOUNTER — Other Ambulatory Visit: Payer: Self-pay | Admitting: Family Medicine

## 2019-12-25 ENCOUNTER — Other Ambulatory Visit: Payer: Self-pay | Admitting: Registered Nurse

## 2019-12-25 ENCOUNTER — Other Ambulatory Visit (HOSPITAL_COMMUNITY): Payer: Self-pay | Admitting: Physician Assistant

## 2019-12-28 ENCOUNTER — Other Ambulatory Visit: Payer: Self-pay | Admitting: Family Medicine

## 2020-01-09 DIAGNOSIS — G4733 Obstructive sleep apnea (adult) (pediatric): Secondary | ICD-10-CM | POA: Diagnosis not present

## 2020-01-09 DIAGNOSIS — J9610 Chronic respiratory failure, unspecified whether with hypoxia or hypercapnia: Secondary | ICD-10-CM | POA: Diagnosis not present

## 2020-01-15 ENCOUNTER — Other Ambulatory Visit: Payer: Self-pay

## 2020-01-15 ENCOUNTER — Encounter: Payer: Medicare Other | Attending: Physical Medicine & Rehabilitation | Admitting: Registered Nurse

## 2020-01-15 ENCOUNTER — Encounter: Payer: Self-pay | Admitting: Registered Nurse

## 2020-01-15 VITALS — BP 118/73 | HR 67 | Ht 65.0 in | Wt 230.0 lb

## 2020-01-15 DIAGNOSIS — Z5181 Encounter for therapeutic drug level monitoring: Secondary | ICD-10-CM | POA: Insufficient documentation

## 2020-01-15 DIAGNOSIS — Z79899 Other long term (current) drug therapy: Secondary | ICD-10-CM | POA: Diagnosis not present

## 2020-01-15 DIAGNOSIS — M546 Pain in thoracic spine: Secondary | ICD-10-CM | POA: Diagnosis not present

## 2020-01-15 DIAGNOSIS — G8929 Other chronic pain: Secondary | ICD-10-CM

## 2020-01-15 DIAGNOSIS — M25551 Pain in right hip: Secondary | ICD-10-CM | POA: Diagnosis not present

## 2020-01-15 DIAGNOSIS — R0781 Pleurodynia: Secondary | ICD-10-CM | POA: Diagnosis not present

## 2020-01-15 DIAGNOSIS — G894 Chronic pain syndrome: Secondary | ICD-10-CM | POA: Insufficient documentation

## 2020-01-15 DIAGNOSIS — M961 Postlaminectomy syndrome, not elsewhere classified: Secondary | ICD-10-CM | POA: Diagnosis not present

## 2020-01-15 DIAGNOSIS — M170 Bilateral primary osteoarthritis of knee: Secondary | ICD-10-CM

## 2020-01-15 DIAGNOSIS — Z79891 Long term (current) use of opiate analgesic: Secondary | ICD-10-CM | POA: Diagnosis not present

## 2020-01-15 MED ORDER — MORPHINE SULFATE ER 15 MG PO TBCR: 15.0000 mg | EXTENDED_RELEASE_TABLET | Freq: Three times a day (TID) | ORAL | 0 refills | Status: DC

## 2020-01-15 NOTE — Progress Notes (Signed)
Subjective:    Patient ID: [name], female    DOB: 04/04/1951, 69 y.o.   MRN: [id number]  HPI: [name] is a 69 y.o. female who returns for follow up appointment for chronic pain and medication refill. She states her pain is located in her mid- back. She rates her pain 5. Her current exercise regime is walking and performing stretching exercises.  [name] Morphine equivalent is 45.00 MME.    Last UDS was Performed on 10/09/2019, it was consistent.    Pain Inventory Average Pain 5 Pain Right Now 5 My pain is constant, burning and aching  In the last 24 hours, has pain interfered with the following? General activity 5 Relation with others 5 Enjoyment of life 5 What TIME of day is your pain at its worst? evening, night Sleep (in general) Good  Pain is worse with: walking, bending, standing and some activites Pain improves with: rest, heat/ice, pacing activities and medication Relief from Meds: 5  Mobility walk without assistance  Function retired  Neuro/Psych depression  Prior Studies Any changes since last visit?  no  Physicians involved in your care Any changes since last visit?  no   Family History  Problem Relation Age of Onset  . Breast cancer Mother   . Diabetes Mother   . Heart disease Mother   . Allergies Mother   . Diabetes Father   . Heart disease Father   . Emphysema Father   . Allergies Father   . Stomach cancer Maternal Uncle   . Breast cancer Paternal Aunt   . Colon cancer Neg Hx    Social History   Socioeconomic History  . Marital status: Married    Spouse name: Not on file  . Number of children: Not on file  . Years of education: Not on file  . Highest education level: Not on file  Occupational History  . Occupation: Radiation protection practitioner  . Occupation: retired  Tobacco Use  . Smoking status: Never Smoker  . Smokeless tobacco: Never Used  Vaping Use  . Vaping Use: Never used  Substance and Sexual Activity  . Alcohol use: Yes     Alcohol/week: 1.0 standard drink    Types: 1 Glasses of wine per week    Comment: rare  . Drug use: Never  . Sexual activity: Not Currently  Other Topics Concern  . Not on file  Social History Narrative  . Not on file   Social Determinants of Health   Financial Resource Strain: Low Risk   . Difficulty of Paying Living Expenses: Not hard at all  Food Insecurity: No Food Insecurity  . Worried About Charity fundraiser in the Last Year: Never true  . Ran Out of Food in the Last Year: Never true  Transportation Needs: No Transportation Needs  . Lack of Transportation (Medical): No  . Lack of Transportation (Non-Medical): No  Physical Activity: Inactive  . Days of Exercise per Week: 0 days  . Minutes of Exercise per Session: 0 min  Stress: No Stress Concern Present  . Feeling of Stress : Not at all  Social Connections:   . Frequency of Communication with Friends and Family:   . Frequency of Social Gatherings with Friends and Family:   . Attends Religious Services:   . Active Member of Clubs or Organizations:   . Attends Archivist Meetings:   Marland Kitchen Marital Status:    Past Surgical History:  Procedure Laterality Date  . 24  HOUR [place] STUDY N/A 01/04/2016   Procedure: 24 HOUR PH STUDY;  Surgeon: Manus Gunning, MD;  Location: [facility] ENDOSCOPY;  Service: Gastroenterology;  Laterality: N/A;  . ABDOMINAL HYSTERECTOMY  1988  . ADENOIDECTOMY    . ANTERIOR CERVICAL DECOMP/DISCECTOMY FUSION  2009, 1993   first procedure in ~ 1993/Dr Botero  . APPENDECTOMY  1988  . BACK SURGERY    . CARDIOVERSION N/A 04/03/2019   Procedure: CARDIOVERSION;  Surgeon: Dorothy Spark, MD;  Location: [facility] ENDOSCOPY;  Service: Cardiovascular;  Laterality: N/A;  . CHOLECYSTECTOMY  2005  . ESOPHAGEAL MANOMETRY N/A 01/04/2016   Procedure: ESOPHAGEAL MANOMETRY (EM) 24 HR PH;  Surgeon: Manus Gunning, MD;  Location: [facility] ENDOSCOPY;  Service: Gastroenterology;  Laterality: N/A;  .  ESOPHAGOGASTRODUODENOSCOPY (EGD) WITH PROPOFOL N/A 05/17/2016   Procedure: ESOPHAGOGASTRODUODENOSCOPY (EGD) WITH PROPOFOL;  Surgeon: Manus Gunning, MD;  Location: [facility]OS[facility];  Service: Gastroenterology;  Laterality: N/A;  . NASAL SINUS SURGERY  2006  . SAVORY DILATION N/A 05/17/2016   Procedure: SAVORY DILATION;  Surgeon: Manus Gunning, MD;  Location: [facility];  Service: Gastroenterology;  Laterality: N/A;  . TEE WITHOUT CARDIOVERSION N/A 04/03/2019   Procedure: TRANSESOPHAGEAL ECHOCARDIOGRAM (TEE)-with DCCV;  Surgeon: Dorothy Spark, MD;  Location: [facility] ENDOSCOPY;; EF 55 to 60%.  No LVH.  Normal RV.  Moderate LA dilation.  No thrombus.  Mild RA dilation.  Mild to moderate TR.  Mild aortic valve sclerosis, no stenosis.  Moderately elevated PA pressures (47 mmHg)  . TONSILLECTOMY AND ADENOIDECTOMY    . TRANSTHORACIC ECHOCARDIOGRAM  2009   Normal LV size and function. EF 60-65%. No regional wall motion and amount is. Mild aortic sclerosis, no stenosis  . TUBAL LIGATION  1979   Past Medical History:  Diagnosis Date  . Allergy   . Anemia   . Arthritis   . B12 deficiency   . Complication of anesthesia    slow to wake up   . Depression   . Diabetes mellitus type 2, diet-controlled ([place]) 07/24/2018  . Fibromyalgia   . Generalized hyperhidrosis 04/17/2014  . GERD (gastroesophageal reflux disease)   . Hyperlipemia   . HYPOTHYROIDISM 05/29/2007  . Irritable bowel syndrome   . Migraines    hx of   . OSA (obstructive sleep apnea) 06/26/2019  . Oxygen deficiency    2 L at bedtime  . PVC (premature ventricular contraction)   . Senile calcific aortic valve sclerosis    Aortic Sclerosis-no stenosis  . Unspecified essential hypertension    denies htn on 05/09/16   BP 118/73   Pulse 67   Ht 5\' 5"  (1.651 m)   Wt 230 lb (104.3 kg)   SpO2 98%   BMI 38.27 kg/m   Opioid Risk Score:   Fall Risk Score:  `1  Depression screen PHQ 2/9  Depression screen [facility] 2/9  12/18/2019 06/19/2019 01/18/2019 08/08/2018 04/24/2018 01/10/2018 11/24/2017  Decreased Interest 0 1 0 0 0 2 1  Down, Depressed, Hopeless 0 1 0 0 0 0 1  PHQ - 2 Score 0 2 0 0 0 2 2  Altered sleeping 0 - 0 - - 0 -  Tired, decreased energy 0 - 3 - - 1 -  Change in appetite 0 - 0 - - 0 -  Feeling bad or failure about yourself  0 - 0 - - 0 -  Trouble concentrating 0 - 0 - - 1 -  Moving slowly or fidgety/restless 0 - 0 - - 0 -  Suicidal thoughts 0 - 0 - - 0 -  PHQ-9 Score 0 - 3 - - 4 -  Difficult doing work/chores - - Not difficult at all - - Not difficult at all Not difficult at all  Some recent data might be hidden    Review of Systems  Constitutional: Negative.   HENT: Negative.   Eyes: Negative.   Respiratory: Negative.   Cardiovascular: Negative.   Gastrointestinal: Negative.   Endocrine: Negative.   Genitourinary: Negative.   Musculoskeletal: Positive for back pain.  Skin: Negative.   Allergic/Immunologic: Negative.   Neurological: Negative.   Hematological: Negative.   Psychiatric/Behavioral: Positive for dysphoric mood.  All other systems reviewed and are negative.      Objective:   Physical Exam Vitals and nursing note reviewed.  Constitutional:      Appearance: Normal appearance.  Cardiovascular:     Rate and Rhythm: Normal rate and regular rhythm.     Pulses: Normal pulses.     Heart sounds: Normal heart sounds.  Pulmonary:     Effort: Pulmonary effort is normal.     Breath sounds: Normal breath sounds.  Musculoskeletal:     Cervical back: Normal range of motion and neck supple.     Comments: Normal Muscle Bulk and Muscle Testing Reveals:  Upper Extremities: Full ROM and Muscle Strength 5/5 Thoracic Paraspinal Tenderness: T-7-T-9 Lower Extremities: Full ROM and Muscle Strength 5/5 Arises from Table with ease Narrow Based Gait   Skin:    General: Skin is warm and dry.  Neurological:     Mental Status: She is alert and oriented to person, place, and time.    Psychiatric:        Mood and Affect: Mood normal.        Behavior: Behavior normal.           Assessment & Plan:  1. Cervical postlaminectomy syndrome:/ Cervicalgia/ Cervical Radiculitis/with neck and shoulder pain/ Chronic Midline Thoracic Pain.Continue exercise routine and using heat therapy.01/15/2020 Refilled:current medication regimen withMS Contin 15 mg one tablet three times a day #90.  We will continue the opioid monitoring program, this consists of regular clinic visits, examinations, urine drug screen, pill counts as well as use of [place] Controlled Substance reporting System. 2. Fibromyalgia/ Neuropathy: Continuecurrent medication regimen withGabapentin, activity and exercise regime.01/15/2020. 3.Left Knee Pain/BilateralKnee Pain/ Mild Degenerative Changes:Continuecurrent medication regimen withVoltaren Gel.01/15/2020. 4.BilateralAnkle Pain:No complaints today.Continue HEP as tolerated. Continueto monitor.01/15/2020. 5. Muscle Spasm: Continuecurrent medication regimen withRobaxin.01/15/2020.. 6. Opioid Induced Constipation: No complaints Continue to Monitor.01/15/2020. 7. Midline Low Back Pain: Continue current medication regime, continue HEP as tolerated.01/15/2020. 8. Right Gluteal Medius Weakness and Atrophy: No complaints Today:Continue with Hip Abduction Exercisesas tolerated: Continue to Monitor.01/15/2020. 9. RightHip Pain/ Right Greater Trochanteric Tenderness:No complaints Today.Continue to Alternate Ice and Heat Therapy. Continue to Monitor.12/18/2019 10. Sacroiliac Joint Pain:No complaints today.S/PSacroiliac Injection on 08/24/2018 with good relief noted.01/15/2020 11.Lumbar Radiculitis:No complaints today.Continue Gabapentin.Continue HEP as tolerated. Continue current medication regimen. Continue to monitor.01/15/2020.  F/U in 1 month  20 minutes of face to face patient care time was spent during this  visit. All questions were encouraged and answered.

## 2020-01-19 ENCOUNTER — Telehealth: Payer: Self-pay | Admitting: Family Medicine

## 2020-01-20 NOTE — Telephone Encounter (Signed)
Please schedule MWV with nurse and CPE with Dr. Lorelei Pont in August.

## 2020-01-21 NOTE — Telephone Encounter (Signed)
Medicare wellness 8/18 Labs 8/20 Medicare cpx 8/23

## 2020-01-28 ENCOUNTER — Other Ambulatory Visit: Payer: Self-pay | Admitting: Family Medicine

## 2020-01-28 DIAGNOSIS — E782 Mixed hyperlipidemia: Secondary | ICD-10-CM

## 2020-01-28 DIAGNOSIS — E538 Deficiency of other specified B group vitamins: Secondary | ICD-10-CM

## 2020-01-28 DIAGNOSIS — E039 Hypothyroidism, unspecified: Secondary | ICD-10-CM

## 2020-01-28 DIAGNOSIS — E119 Type 2 diabetes mellitus without complications: Secondary | ICD-10-CM

## 2020-01-28 DIAGNOSIS — Z79899 Other long term (current) drug therapy: Secondary | ICD-10-CM

## 2020-01-28 DIAGNOSIS — E559 Vitamin D deficiency, unspecified: Secondary | ICD-10-CM

## 2020-01-29 ENCOUNTER — Telehealth: Payer: Self-pay

## 2020-01-29 NOTE — Progress Notes (Signed)
Unsuccessful outreach. Called patient for Diabetes assessment. 01/29/20 153pm. Left voicemail.  Martinique Uselman, [place] Pharmacist Assistant  [phone]

## 2020-02-05 ENCOUNTER — Other Ambulatory Visit: Payer: Self-pay | Admitting: Family Medicine

## 2020-02-08 DIAGNOSIS — E119 Type 2 diabetes mellitus without complications: Secondary | ICD-10-CM | POA: Diagnosis not present

## 2020-02-09 DIAGNOSIS — J9610 Chronic respiratory failure, unspecified whether with hypoxia or hypercapnia: Secondary | ICD-10-CM | POA: Diagnosis not present

## 2020-02-09 DIAGNOSIS — G4733 Obstructive sleep apnea (adult) (pediatric): Secondary | ICD-10-CM | POA: Diagnosis not present

## 2020-02-12 ENCOUNTER — Other Ambulatory Visit: Payer: Self-pay

## 2020-02-12 ENCOUNTER — Ambulatory Visit (INDEPENDENT_AMBULATORY_CARE_PROVIDER_SITE_OTHER): Payer: Medicare Other

## 2020-02-12 DIAGNOSIS — Z0000 Encounter for general adult medical examination without abnormal findings: Secondary | ICD-10-CM

## 2020-02-12 NOTE — Progress Notes (Signed)
Subjective:   [name] is a 69 y.o. female who presents for Medicare Annual (Subsequent) preventive examination.  Review of Systems: N/A      I connected with the patient today by telephone and verified that I am speaking with the correct person using two identifiers. Location patient: home Location nurse: work Persons participating in the virtual visit: patient, Marine scientist.   I discussed the limitations, risks, security and privacy concerns of performing an evaluation and management service by telephone and the availability of in person appointments. I also discussed with the patient that there may be a patient responsible charge related to this service. The patient expressed understanding and verbally consented to this telephonic visit.    Interactive audio and video telecommunications were attempted between this nurse and patient, however failed, due to patient having technical difficulties OR patient did not have access to video capability.  We continued and completed visit with audio only.     Cardiac Risk Factors include: advanced age (>11men, >69 women);diabetes mellitus;hypertension;dyslipidemia     Objective:    Today's Vitals   02/12/20 1311  PainSc: 6    There is no height or weight on file to calculate BMI.  Advanced Directives 02/12/2020 06/26/2019 04/13/2019 04/02/2019 01/18/2019 01/10/2018 04/26/2017  Does Patient Have a Medical Advance Directive? Yes Yes No No Yes Yes Yes  Type of Paramedic of [place];Living will [place];Living will - - [place];Living will [place];Living will [place];Living will  Does patient want to make changes to medical advance directive? - No - Patient declined - - No - Patient declined - -  Copy of [place] in Chart? No - copy requested No - copy requested - - No - copy requested Yes No - copy requested  Would patient  like information on creating a medical advance directive? - - No - Patient declined No - Patient declined - - -    Current Medications (verified) Outpatient Encounter Medications as of 02/12/2020  Medication Sig  . acetaminophen (TYLENOL) 650 MG CR tablet Take 650 mg by mouth in the morning and at bedtime.   . B Complex Vitamins (B COMPLEX 100 PO) Take 1 tablet by mouth every morning.   . bisacodyl (DULCOLAX) 5 MG EC tablet Take 10 mg by mouth at bedtime.   . calcium carbonate (TUMS EX) 750 MG chewable tablet Chew 1-2 tablets by mouth 2 (two) times daily as needed for heartburn (acid reflux).  . Calcium Carbonate-Vitamin D (CALCIUM 600+D PO) Take 1 tablet by mouth in the morning and at bedtime.   . carboxymethylcellulose (REFRESH PLUS) 0.5 % SOLN Place 1 drop into both eyes 3 (three) times daily as needed (dry eyes).   . cetirizine (ZYRTEC) 10 MG tablet Take 10 mg by mouth at bedtime.   . Cholecalciferol (VITAMIN D) 125 MCG (5000 UT) CAPS Take 5,000 Units by mouth every morning.   . clindamycin (CLEOCIN T) 1 % lotion Apply 1 application topically daily as needed (eczema).   . Coenzyme Q-10 100 MG capsule Take 100 mg by mouth every morning.   . diclofenac sodium (VOLTAREN) 1 % GEL Apply 4 g topically 4 (four) times daily as needed (joint pain).   Marland Kitchen diltiazem (CARDIZEM CD) 240 MG 24 hr capsule TAKE 1 CAPSULE BY MOUTH  DAILY  . docusate sodium (COLACE) 250 MG capsule Take 250 mg by mouth at bedtime.  Marland Kitchen ELIQUIS 5 MG  TABS tablet TAKE 1 TABLET BY MOUTH  TWICE DAILY  . flecainide (TAMBOCOR) 50 MG tablet Take 1 tablet (50 mg total) by mouth 2 (two) times daily.  . fluocinonide (LIDEX) 0.05 % external solution Apply 1 application topically daily as needed (eczema on scalp).   . gabapentin (NEURONTIN) 300 MG capsule TAKE 1 CAPSULE BY MOUTH 3  TIMES DAILY  . GRAPE SEED EXTRACT PO Take 100 mg by mouth 2 (two) times daily.   Marland Kitchen levothyroxine (SYNTHROID) 50 MCG tablet TAKE 1 TABLET BY MOUTH  DAILY  .  loratadine (CLARITIN) 10 MG tablet Take 10 mg by mouth every morning.   . Magnesium Bisglycinate (MAG GLYCINATE PO) Take 400 mg by mouth at bedtime.   . methocarbamol (ROBAXIN) 500 MG tablet TAKE ONE (1) TABLET BY MOUTH EVERY EIGHTHOURS AS NEEDED FOR MUSCLE SPASMS  . mirtazapine (REMERON) 15 MG tablet TAKE 1 TABLET BY MOUTH AT  BEDTIME  . morphine (MS CONTIN) 15 MG 12 hr tablet Take 1 tablet (15 mg total) by mouth 3 (three) times daily. Do Not Fill Before 02/14/2020  . nitroGLYCERIN (NITROSTAT) 0.4 MG SL tablet Place 1 tablet (0.4 mg total) under the tongue every 5 (five) minutes as needed for chest pain.  Marland Kitchen oxybutynin (DITROPAN-XL) 5 MG 24 hr tablet Take 1 tablet (5 mg total) by mouth at bedtime.  Marland Kitchen oxymetazoline (AFRIN) 0.05 % nasal spray Place 1 spray into both nostrils 2 (two) times daily as needed for congestion.  . RABEprazole (ACIPHEX) 20 MG tablet TAKE 1 TABLET BY MOUTH EVERY DAY  . tretinoin (RETIN-A) 0.05 % cream Apply 1 application topically daily as needed (acne).   . vitamin B-12 (CYANOCOBALAMIN) 1000 MCG tablet Take 1,000 mcg by mouth daily.  . citalopram (CELEXA) 40 MG tablet TAKE 1 TABLET BY MOUTH  DAILY  . oxybutynin (DITROPAN-XL) 10 MG 24 hr tablet TAKE 1 TABLET BY MOUTH AT  BEDTIME   No facility-administered encounter medications on file as of 02/12/2020.    Allergies (verified) Cyclobenzaprine hcl, Rizatriptan benzoate, Sumatriptan, Tizanidine, Aleve [naproxen sodium], Clarithromycin, Doxycycline, Duloxetine, Egg [eggs or egg-derived products], Keflex [cephalexin], Metronidazole, Oxycodone hcl, Oxycontin [oxycodone hcl], Penicillins, Rosuvastatin, Venlafaxine, Voltaren [diclofenac sodium], Septra [sulfamethoxazole-trimethoprim], and Tramadol   History: Past Medical History:  Diagnosis Date  . Allergy   . Anemia   . Arthritis   . B12 deficiency   . Complication of anesthesia    slow to wake up   . Depression   . Diabetes mellitus type 2, diet-controlled ([place]) 07/24/2018   . Fibromyalgia   . Generalized hyperhidrosis 04/17/2014  . GERD (gastroesophageal reflux disease)   . Hyperlipemia   . HYPOTHYROIDISM 05/29/2007  . Irritable bowel syndrome   . Migraines    hx of   . OSA (obstructive sleep apnea) 06/26/2019  . Oxygen deficiency    2 L at bedtime  . PVC (premature ventricular contraction)   . Senile calcific aortic valve sclerosis    Aortic Sclerosis-no stenosis  . Unspecified essential hypertension    denies htn on 05/09/16   Past Surgical History:  Procedure Laterality Date  . [place] STUDY N/A 01/04/2016   Procedure: [place] STUDY;  Surgeon: Manus Gunning, MD;  Location: [facility] ENDOSCOPY;  Service: Gastroenterology;  Laterality: N/A;  . ABDOMINAL HYSTERECTOMY  1988  . ADENOIDECTOMY    . ANTERIOR CERVICAL DECOMP/DISCECTOMY FUSION  2009, 1993   first procedure in ~ 1993/Dr Botero  . APPENDECTOMY  1988  . BACK SURGERY    .  CARDIOVERSION N/A 04/03/2019   Procedure: CARDIOVERSION;  Surgeon: Dorothy Spark, MD;  Location: [facility] ENDOSCOPY;  Service: Cardiovascular;  Laterality: N/A;  . CHOLECYSTECTOMY  2005  . ESOPHAGEAL MANOMETRY N/A 01/04/2016   Procedure: ESOPHAGEAL MANOMETRY (EM) 24 HR PH;  Surgeon: Manus Gunning, MD;  Location: [facility] ENDOSCOPY;  Service: Gastroenterology;  Laterality: N/A;  . ESOPHAGOGASTRODUODENOSCOPY (EGD) WITH PROPOFOL N/A 05/17/2016   Procedure: ESOPHAGOGASTRODUODENOSCOPY (EGD) WITH PROPOFOL;  Surgeon: Manus Gunning, MD;  Location: [facility] ENDOSCOPY;  Service: Gastroenterology;  Laterality: N/A;  . NASAL SINUS SURGERY  2006  . SAVORY DILATION N/A 05/17/2016   Procedure: SAVORY DILATION;  Surgeon: Manus Gunning, MD;  Location: [facility];  Service: Gastroenterology;  Laterality: N/A;  . TEE WITHOUT CARDIOVERSION N/A 04/03/2019   Procedure: TRANSESOPHAGEAL ECHOCARDIOGRAM (TEE)-with DCCV;  Surgeon: Dorothy Spark, MD;  Location: [facility] ENDOSCOPY;; EF 55 to 60%.  No LVH.  Normal RV.  Moderate LA  dilation.  No thrombus.  Mild RA dilation.  Mild to moderate TR.  Mild aortic valve sclerosis, no stenosis.  Moderately elevated PA pressures (47 mmHg)  . TONSILLECTOMY AND ADENOIDECTOMY    . TRANSTHORACIC ECHOCARDIOGRAM  2009   Normal LV size and function. EF 60-65%. No regional wall motion and amount is. Mild aortic sclerosis, no stenosis  . TUBAL LIGATION  1979   Family History  Problem Relation Age of Onset  . Breast cancer Mother   . Diabetes Mother   . Heart disease Mother   . Allergies Mother   . Diabetes Father   . Heart disease Father   . Emphysema Father   . Allergies Father   . Stomach cancer Maternal Uncle   . Breast cancer Paternal Aunt   . Colon cancer Neg Hx    Social History   Socioeconomic History  . Marital status: Married    Spouse name: Not on file  . Number of children: Not on file  . Years of education: Not on file  . Highest education level: Not on file  Occupational History  . Occupation: Radiation protection practitioner  . Occupation: retired  Tobacco Use  . Smoking status: Never Smoker  . Smokeless tobacco: Never Used  Vaping Use  . Vaping Use: Never used  Substance and Sexual Activity  . Alcohol use: Yes    Alcohol/week: 1.0 standard drink    Types: 1 Glasses of wine per week    Comment: rare  . Drug use: Never  . Sexual activity: Not Currently  Other Topics Concern  . Not on file  Social History Narrative  . Not on file   Social Determinants of Health   Financial Resource Strain: Low Risk   . Difficulty of Paying Living Expenses: Not hard at all  Food Insecurity: No Food Insecurity  . Worried About Charity fundraiser in the Last Year: Never true  . Ran Out of Food in the Last Year: Never true  Transportation Needs: No Transportation Needs  . Lack of Transportation (Medical): No  . Lack of Transportation (Non-Medical): No  Physical Activity: Inactive  . Days of Exercise per Week: 0 days  . Minutes of Exercise per Session: 0 min  Stress: No Stress  Concern Present  . Feeling of Stress : Not at all  Social Connections:   . Frequency of Communication with Friends and Family:   . Frequency of Social Gatherings with Friends and Family:   . Attends Religious Services:   . Active Member of Clubs or Organizations:   .  Attends Archivist Meetings:   Marland Kitchen Marital Status:     Tobacco Counseling Counseling given: Not Answered   Clinical Intake:  Pre-visit preparation completed: Yes  Pain : 0-10 Pain Score: 6  Pain Type: Chronic pain Pain Location: Back Pain Orientation: Lower Pain Descriptors / Indicators: Aching Pain Onset: More than a month ago Pain Frequency: Constant     Nutritional Risks: None Diabetes: Yes CBG done?: No Did pt. bring in CBG monitor from home?: No  How often do you need to have someone help you when you read instructions, pamphlets, or other written materials from your doctor or pharmacy?: 1 - Never What is the last grade level you completed in school?: Some college  Diabetic: Yes Nutrition Risk Assessment:  Has the patient had any N/V/D within the last 2 months?  No  Does the patient have any non-healing wounds?  No  Has the patient had any unintentional weight loss or weight gain?  No   Diabetes:  Is the patient diabetic?  Yes  If diabetic, was a CBG obtained today?  No  Did the patient bring in their glucometer from home?  No  How often do you monitor your CBG's? When needed.   Financial Strains and Diabetes Management:  Are you having any financial strains with the device, your supplies or your medication? No .  Does the patient want to be seen by Chronic Care Management for management of their diabetes?  No  Would the patient like to be referred to a Nutritionist or for Diabetic Management?  No   Diabetic Exams:  Diabetic Eye Exam: Completed 07/02/2019 Diabetic Foot Exam: Overdue, Pt has been advised about the importance in completing this exam. Pt is scheduled for diabetic foot  exam on 02/17/2020.   Interpreter Needed?: No  Information entered by :: CJohnson, LPN   Activities of Daily Living In your present state of health, do you have any difficulty performing the following activities: 02/12/2020 04/02/2019  Hearing? N -  Vision? N -  Difficulty concentrating or making decisions? N -  Walking or climbing stairs? N -  Dressing or bathing? N -  Doing errands, shopping? N N  Preparing Food and eating ? N -  Using the Toilet? N -  In the past six months, have you accidently leaked urine? Y -  Comment takes medication- is helping currently -  Do you have problems with loss of bowel control? N -  Managing your Medications? N -  Managing your Finances? N -  Housekeeping or managing your Housekeeping? N -  Some recent data might be hidden    Patient Care Team: Owens Loffler, MD as PCP - General [name], MD as PCP - Cardiology (Cardiology) Letta Pate Luanna Salk, MD as Consulting Physician (Physical Medicine and Rehabilitation) Armbruster, Carlota Raspberry, MD as Consulting Physician (Gastroenterology) [name], MD as Consulting Physician (Cardiology) Chesley Mires, MD as Consulting Physician (Pulmonary Disease) Debbora Dus, [facility] as Pharmacist (Pharmacist)  Indicate any recent Medical Services you may have received from other than [facility] providers in the past year (date may be approximate).     Assessment:   This is a routine wellness examination for [place].  Hearing/Vision screen  Hearing Screening   125Hz  250Hz  500Hz  1000Hz  2000Hz  3000Hz  4000Hz  6000Hz  8000Hz   Right ear:           Left ear:           Vision Screening Comments: Patient gets annual eye exams  Dietary issues and exercise activities discussed: Current Exercise Habits: The patient does not participate in regular exercise at present, Exercise limited by: None identified  Goals    . Increase physical activity     Starting 01/02/2017, I will increase my activity as tolerated and  continue doing resistance band exercises daily.    . Patient Stated     Starting 01/10/18, I will continue to take medications as prescribed.     . Patient Stated     02/12/2020, I will maintain and continue medications as prescribed.     . Pharmacy Care Plan     CARE PLAN ENTRY  Current Barriers:  . Chronic Disease Management support, education, and care coordination needs related to vaccinations, excessive sweating  Pharmacist Clinical Goal(s):  Marland Kitchen Over the next 6 months, patient will work with PharmD and primary care provider to address the following goals: o Reduce medication burden: Trial reducing oxybutynin to every other day and tapering slowly to see if sweating remains a concern o Vaccinations: Remain up to date on vaccinations. Recommend 2-dose series of shingles vaccine (Shingrix).  Interventions: . Comprehensive medication review performed  Patient Self Care Activities:  For the next 6 months until follow up visit:  . Check with local pharmacy for shingles vaccine . Please call if any concerns with oxybutynin taper/sweating  Initial goal documentation    . Pharmacy Care Plan: Diabetes     CARE PLAN: Diabetes  Current Barriers:  . Diabetes: uncontrolled (A1c goal < 7%); complicated by chronic medical conditions including hypertension, hyperlipidemia Lab Results  Component Value Date   HGBA1C 7.3 (H) 08/01/2019   Lab Results  Component Value Date   CREATININE 0.71 08/29/2019   CREATININE 0.80 08/01/2019   CREATININE 0.89 04/13/2019 .  Current antihyperglycemic regimen: no pharmacotherapy . Denies hypoglycemic symptoms, including dizziness, lightheadedness, shaking, sweating . Current meal patterns: reports diet high in vegetables and protein, specifically chicken mostly, beef twice weekly, fish once weekly, and fruit  . Current exercise: minimal . Current blood glucose readings: none reported  Pharmacist Clinical Goal(s):  Marland Kitchen Over the next 6 months, patient  will work with PharmD and primary care provider towards the following goals: o Reduce A1c within goal of less than 7%  Interventions: . Comprehensive medication review performed, medication list updated in electronic medical record  Patient Self Care Activities:  Over the next 6 months until follow up visit: . Patient would like to decrease ice cream/sweets . Consider resuming metformin therapy if A1c remains elevated  Initial goal documentation    . Pharmacy Care Plan: Hyperlipidemia     CARE PLAN: Cholesterol  Current Barriers:  . Uncontrolled hyperlipidemia, complicated by diabetes, hypertension . Current antihyperlipidemic regimen:  o No prescription pharmacotherapy o OTC supplement: resveratrol 100 mg - 1 tablet twice daily  . Previous antihyperlipidemic medications tried: rosuvastatin (leg cramps/pain) . Most recent lipid panel:     Component Value Date/Time   CHOL 176 08/01/2019 0919   TRIG 96.0 08/01/2019 0919   HDL 49.60 08/01/2019 0919   CHOLHDL 4 08/01/2019 0919   VLDL 19.2 08/01/2019 0919   LDLCALC 107 (H) 08/01/2019 0919   LDLDIRECT 125.0 01/18/2019 1407 .  ASCVD risk enhancing conditions: age >70, DM, HTN . 10-year ASCVD risk score: 12.6%  Pharmacist Clinical Goal(s):  Marland Kitchen Over the next 6 months, patient will work with PharmD and providers towards the following goals: o Improve LDL (bad cholesterol) within goal < 100 o Maintain HDL (good cholesterol) goal >  50; Triglyceride goal < 150 o Increase exercise with goal of 30 minutes, 5 days per week o Diet high in vegetables, fruits and whole grains with low-fat dairy products, chicken, fish, legumes, non-tropical vegetable oils and nuts. Limit intake of sweets, sugar-sweetened beverages and red meats.  Interventions: . Comprehensive medication review performed; medication list updated in electronic medical record.  . Counseled on ASCVD risk score and how to reduce risk with diet, exercise, and medication options  (for example, one weekly statin therapy)  Patient Self Care Activities:  . Begin exercising slowly for 10-30 minutes 2-3 days per week . Review cholesterol in foods handout for heart healthy diet tips  Initial goal documentation    . Weight (lb) < 200 lb (90.7 kg)     01/18/2019, would like to lose 10-15      Depression Screen PHQ 2/9 Scores 02/12/2020 12/18/2019 06/19/2019 01/18/2019 08/08/2018 04/24/2018 01/10/2018  PHQ - 2 Score 0 0 2 0 0 0 2  PHQ- 9 Score 0 0 - 3 - - 4    Fall Risk Fall Risk  02/12/2020 01/15/2020 12/18/2019 11/12/2019 10/09/2019  Falls in the past year? 1 0 0 0 1  Comment tripped and fell - - - -  Number falls in past yr: 1 - - - 1  Injury with Fall? 0 - - - 1  Comment - - - - hit head, knees bruised, hip and rib pain  Risk for fall due to : History of fall(s);Medication side effect - - - -  Follow up Falls evaluation completed;Falls prevention discussed - - - -    Any stairs in or around the home? Yes  If so, are there any without handrails? No  Home free of loose throw rugs in walkways, pet beds, electrical cords, etc? Yes  Adequate lighting in your home to reduce risk of falls? Yes   ASSISTIVE DEVICES UTILIZED TO PREVENT FALLS:  Life alert? No  Use of a cane, walker or w/c? No  Grab bars in the bathroom? No  Shower chair or bench in shower? No  Elevated toilet seat or a handicapped toilet? No   TIMED UP AND GO:  Was the test performed? N/A, telephonic visit .    Cognitive Function: MMSE - Mini Mental State Exam 02/12/2020 01/18/2019 01/10/2018 01/02/2017  Orientation to time 5 5 5 5   Orientation to Place 5 5 5 5   Registration 3 3 3 3   Attention/ Calculation 5 5 0 0  Recall 3 3 3 3   Language- name 2 objects - 0 0 0  Language- repeat 1 1 1 1   Language- follow 3 step command - 0 3 3  Language- read & follow direction - 0 0 0  Write a sentence - 0 0 0  Copy design - 0 0 0  Total score - 22 20 20   Mini Cog  Mini-Cog screen was completed. Maximum  score is 22. A value of 0 denotes this part of the MMSE was not completed or the patient failed this part of the Mini-Cog screening.       Immunizations Immunization History  Administered Date(s) Administered  . PFIZER SARS-COV-2 Vaccination 08/22/2019, 09/18/2019  . Pneumococcal Conjugate-13 01/02/2017  . Pneumococcal Polysaccharide-23 05/06/2013, 07/25/2018  . Td 06/28/2003, 10/16/2013  . Zoster 11/07/2012    TDAP status: Up to date Flu Vaccine status: due, will be available in the office at the end of August  Pneumococcal vaccine status: Up to date Covid-19 vaccine status: Completed vaccines  Qualifies for Shingles Vaccine? Yes   Zostavax completed Yes   Shingrix Completed?: No.    Education has been provided regarding the importance of this vaccine. Patient has been advised to call insurance company to determine out of pocket expense if they have not yet received this vaccine. Advised may also receive vaccine at local pharmacy or Health Dept. Verbalized acceptance and understanding.  Screening Tests Health Maintenance  Topic Date Due  . FOOT EXAM  11/30/2018  . URINE MICROALBUMIN  01/18/2020  . INFLUENZA VACCINE  01/26/2020  . HEMOGLOBIN A1C  01/29/2020  . OPHTHALMOLOGY EXAM  07/01/2020  . Fecal DNA (Cologuard)  08/05/2021  . MAMMOGRAM  11/07/2021  . TETANUS/TDAP  10/17/2023  . DEXA SCAN  Completed  . COVID-19 Vaccine  Completed  . Hepatitis C Screening  Completed  . PNA vac Low Risk Adult  Completed    Health Maintenance  Health Maintenance Due  Topic Date Due  . FOOT EXAM  11/30/2018  . URINE MICROALBUMIN  01/18/2020  . INFLUENZA VACCINE  01/26/2020  . HEMOGLOBIN A1C  01/29/2020    Colorectal cancer screening: Completed Cologuard 08/05/2018. Repeat every 3 years Mammogram status: Completed 11/08/2019. Repeat every year Bone Density status: Completed 03/05/2018. Results reflect: Bone density results: OSTEOPENIA. Repeat every 2 years.  Lung Cancer Screening: (Low  Dose CT Chest recommended if Age 46-80 years, 30 pack-year currently smoking OR have quit w/in 15 years.) does not qualify.    Additional Screening:  Hepatitis C Screening: does qualify; Completed 04/01/2019  Vision Screening: Recommended annual ophthalmology exams for early detection of glaucoma and other disorders of the eye. Is the patient up to date with their annual eye exam?  Yes  Who is the provider or what is the name of the office in which the patient attends annual eye exams? Dr. Agapito Games If pt is not established with a provider, would they like to be referred to a provider to establish care? No .   Dental Screening: Recommended annual dental exams for proper oral hygiene  Community Resource Referral / Chronic Care Management: CRR required this visit?  No   CCM required this visit?  No      Plan:     I have personally reviewed and noted the following in the patient's chart:   . Medical and social history . Use of alcohol, tobacco or illicit drugs  . Current medications and supplements . Functional ability and status . Nutritional status . Physical activity . Advanced directives . List of other physicians . Hospitalizations, surgeries, and ER visits in previous 12 months . Vitals . Screenings to include cognitive, depression, and falls . Referrals and appointments  In addition, I have reviewed and discussed with patient certain preventive protocols, quality metrics, and best practice recommendations. A written personalized care plan for preventive services as well as general preventive health recommendations were provided to patient.   Due to this being a telephonic visit, the after visit summary with patients personalized plan was offered to patient via mail or my-chart. Patient preferred to pick up at office at next visit.   Andrez Grime, LPN   03/11/568

## 2020-02-12 NOTE — Patient Instructions (Signed)
[name] , Thank you for taking time to come for your Medicare Wellness Visit. I appreciate your ongoing commitment to your health goals. Please review the following plan we discussed and let me know if I can assist you in the future.   Screening recommendations/referrals: Colonoscopy: Cologuard completed 08/05/2018, due 07/2021 Mammogram: Up to date, completed 11/08/2019, due 10/2020 Bone Density: Up to date, completed 03/05/2018, due 02/2020 Recommended yearly ophthalmology/optometry visit for glaucoma screening and checkup Recommended yearly dental visit for hygiene and checkup  Vaccinations: Influenza vaccine: due, will be available in the office at the end of August Pneumococcal vaccine: Completed series Tdap vaccine: Up to date, completed 10/16/2013, due 09/2023 Shingles vaccine: due, check with your insurance regarding coverage    Covid-19:Completed series  Advanced directives: Please bring a copy of your POA (Power of [place]) and/or Living Will to your next appointment.   Conditions/risks identified: diabetes, hypertension, hyperlipidemia  Next appointment: Follow up in one year for your annual wellness visit    Preventive Care 77 Years and Older, Female Preventive care refers to lifestyle choices and visits with your health care provider that can promote health and wellness. What does preventive care include?  A yearly physical exam. This is also called an annual well check.  Dental exams once or twice a year.  Routine eye exams. Ask your health care provider how often you should have your eyes checked.  Personal lifestyle choices, including:  Daily care of your teeth and gums.  Regular physical activity.  Eating a healthy diet.  Avoiding tobacco and drug use.  Limiting alcohol use.  Practicing safe sex.  Taking low-dose aspirin every day.  Taking vitamin and mineral supplements as recommended by your health care provider. What happens during an annual well  check? The services and screenings done by your health care provider during your annual well check will depend on your age, overall health, lifestyle risk factors, and family history of disease. Counseling  Your health care provider may ask you questions about your:  Alcohol use.  Tobacco use.  Drug use.  Emotional well-being.  Home and relationship well-being.  Sexual activity.  Eating habits.  History of falls.  Memory and ability to understand (cognition).  Work and work Statistician.  Reproductive health. Screening  You may have the following tests or measurements:  Height, weight, and BMI.  Blood pressure.  Lipid and cholesterol levels. These may be checked every 5 years, or more frequently if you are over 46 years old.  Skin check.  Lung cancer screening. You may have this screening every year starting at age 48 if you have a 30-pack-year history of smoking and currently smoke or have quit within the past 15 years.  Fecal occult blood test (FOBT) of the stool. You may have this test every year starting at age 35.  Flexible sigmoidoscopy or colonoscopy. You may have a sigmoidoscopy every 5 years or a colonoscopy every 10 years starting at age 45.  Hepatitis C blood test.  Hepatitis B blood test.  Sexually transmitted disease (STD) testing.  Diabetes screening. This is done by checking your blood sugar (glucose) after you have not eaten for a while (fasting). You may have this done every 1-3 years.  Bone density scan. This is done to screen for osteoporosis. You may have this done starting at age 44.  Mammogram. This may be done every 1-2 years. Talk to your health care provider about how often you should have regular mammograms. Talk with your  health care provider about your test results, treatment options, and if necessary, the need for more tests. Vaccines  Your health care provider may recommend certain vaccines, such as:  Influenza vaccine. This is  recommended every year.  Tetanus, diphtheria, and acellular pertussis (Tdap, Td) vaccine. You may need a Td booster every 10 years.  Zoster vaccine. You may need this after age 37.  Pneumococcal 13-valent conjugate (PCV13) vaccine. One dose is recommended after age 31.  Pneumococcal polysaccharide (PPSV23) vaccine. One dose is recommended after age 98. Talk to your health care provider about which screenings and vaccines you need and how often you need them. This information is not intended to replace advice given to you by your health care provider. Make sure you discuss any questions you have with your health care provider. Document Released: 07/10/2015 Document Revised: 03/02/2016 Document Reviewed: 04/14/2015 Elsevier Interactive Patient Education  2017 [employer] Prevention in the Home Falls can cause injuries. They can happen to people of all ages. There are many things you can do to make your home safe and to help prevent falls. What can I do on the outside of my home?  Regularly fix the edges of walkways and driveways and fix any cracks.  Remove anything that might make you trip as you walk through a door, such as a raised step or threshold.  Trim any bushes or trees on the path to your home.  Use bright outdoor lighting.  Clear any walking paths of anything that might make someone trip, such as rocks or tools.  Regularly check to see if handrails are loose or broken. Make sure that both sides of any steps have handrails.  Any raised decks and porches should have guardrails on the edges.  Have any leaves, snow, or ice cleared regularly.  Use sand or salt on walking paths during winter.  Clean up any spills in your garage right away. This includes oil or grease spills. What can I do in the bathroom?  Use night lights.  Install grab bars by the toilet and in the tub and shower. Do not use towel bars as grab bars.  Use non-skid mats or decals in the tub or  shower.  If you need to sit down in the shower, use a plastic, non-slip stool.  Keep the floor dry. Clean up any water that spills on the floor as soon as it happens.  Remove soap buildup in the tub or shower regularly.  Attach bath mats securely with double-sided non-slip rug tape.  Do not have throw rugs and other things on the floor that can make you trip. What can I do in the bedroom?  Use night lights.  Make sure that you have a light by your bed that is easy to reach.  Do not use any sheets or blankets that are too big for your bed. They should not hang down onto the floor.  Have a firm chair that has side arms. You can use this for support while you get dressed.  Do not have throw rugs and other things on the floor that can make you trip. What can I do in the kitchen?  Clean up any spills right away.  Avoid walking on wet floors.  Keep items that you use a lot in easy-to-reach places.  If you need to reach something above you, use a strong step stool that has a grab bar.  Keep electrical cords out of the way.  Do not use  floor polish or wax that makes floors slippery. If you must use wax, use non-skid floor wax.  Do not have throw rugs and other things on the floor that can make you trip. What can I do with my stairs?  Do not leave any items on the stairs.  Make sure that there are handrails on both sides of the stairs and use them. Fix handrails that are broken or loose. Make sure that handrails are as long as the stairways.  Check any carpeting to make sure that it is firmly attached to the stairs. Fix any carpet that is loose or worn.  Avoid having throw rugs at the top or bottom of the stairs. If you do have throw rugs, attach them to the floor with carpet tape.  Make sure that you have a light switch at the top of the stairs and the bottom of the stairs. If you do not have them, ask someone to add them for you. What else can I do to help prevent  falls?  Wear shoes that:  Do not have high heels.  Have rubber bottoms.  Are comfortable and fit you well.  Are closed at the toe. Do not wear sandals.  If you use a stepladder:  Make sure that it is fully opened. Do not climb a closed stepladder.  Make sure that both sides of the stepladder are locked into place.  Ask someone to hold it for you, if possible.  Clearly mark and make sure that you can see:  Any grab bars or handrails.  First and last steps.  Where the edge of each step is.  Use tools that help you move around (mobility aids) if they are needed. These include:  Canes.  Walkers.  Scooters.  Crutches.  Turn on the lights when you go into a dark area. Replace any light bulbs as soon as they burn out.  Set up your furniture so you have a clear path. Avoid moving your furniture around.  If any of your floors are uneven, fix them.  If there are any pets around you, be aware of where they are.  Review your medicines with your doctor. Some medicines can make you feel dizzy. This can increase your chance of falling. Ask your doctor what other things that you can do to help prevent falls. This information is not intended to replace advice given to you by your health care provider. Make sure you discuss any questions you have with your health care provider. Document Released: 04/09/2009 Document Revised: 11/19/2015 Document Reviewed: 07/18/2014 Elsevier Interactive Patient Education  2017 [employer].

## 2020-02-12 NOTE — Progress Notes (Signed)
PCP notes:  Health Maintenance: Foot exam- due Flu vaccine- due Shingrix- due   Abnormal Screenings: none   Patient concerns: none   Nurse concerns: none   Next PCP appt.: 02/17/2020 @ 2:40 pm

## 2020-02-13 ENCOUNTER — Encounter: Payer: Self-pay | Admitting: Family Medicine

## 2020-02-14 ENCOUNTER — Other Ambulatory Visit: Payer: Medicare Other

## 2020-02-17 ENCOUNTER — Encounter: Payer: Medicare Other | Admitting: Family Medicine

## 2020-02-19 ENCOUNTER — Encounter: Payer: Medicare Other | Attending: Physical Medicine & Rehabilitation | Admitting: Registered Nurse

## 2020-02-19 ENCOUNTER — Other Ambulatory Visit (HOSPITAL_COMMUNITY): Payer: Self-pay | Admitting: Physician Assistant

## 2020-02-19 ENCOUNTER — Other Ambulatory Visit: Payer: Self-pay

## 2020-02-19 ENCOUNTER — Encounter: Payer: Self-pay | Admitting: Registered Nurse

## 2020-02-19 VITALS — BP 103/69 | HR 76 | Temp 97.2°F | Ht 65.0 in | Wt 220.0 lb

## 2020-02-19 DIAGNOSIS — G894 Chronic pain syndrome: Secondary | ICD-10-CM | POA: Diagnosis not present

## 2020-02-19 DIAGNOSIS — M961 Postlaminectomy syndrome, not elsewhere classified: Secondary | ICD-10-CM | POA: Diagnosis not present

## 2020-02-19 DIAGNOSIS — M545 Low back pain, unspecified: Secondary | ICD-10-CM

## 2020-02-19 DIAGNOSIS — Z79891 Long term (current) use of opiate analgesic: Secondary | ICD-10-CM

## 2020-02-19 DIAGNOSIS — M170 Bilateral primary osteoarthritis of knee: Secondary | ICD-10-CM

## 2020-02-19 DIAGNOSIS — G8929 Other chronic pain: Secondary | ICD-10-CM

## 2020-02-19 DIAGNOSIS — M546 Pain in thoracic spine: Secondary | ICD-10-CM | POA: Diagnosis not present

## 2020-02-19 DIAGNOSIS — M25551 Pain in right hip: Secondary | ICD-10-CM | POA: Insufficient documentation

## 2020-02-19 DIAGNOSIS — R0781 Pleurodynia: Secondary | ICD-10-CM | POA: Insufficient documentation

## 2020-02-19 DIAGNOSIS — Z5181 Encounter for therapeutic drug level monitoring: Secondary | ICD-10-CM

## 2020-02-19 DIAGNOSIS — Z79899 Other long term (current) drug therapy: Secondary | ICD-10-CM | POA: Insufficient documentation

## 2020-02-19 NOTE — Progress Notes (Signed)
Subjective:    Patient ID: [name], female    DOB: 01/12/1951, 69 y.o.   MRN: [id number]  HPI: [name] is a 69 y.o. female whose appointment was changed to a virtual visit, she had COVID-19 exposure and she's quarantine presently. She agrees with virtual visit and verbalizes understanding. She states her pain is located in mid- lower back and bilateral knee pain. She rates her pain 5. Her  current exercise regime is walking and performing stretching exercises.  [name] also reports her husband was diagnosed with Colon Cancer, emotional support given.   [name] Morphine equivalent is 45.00 MME.   Last UDS was Performed on  10/09/2019, it was consistent.   Pain Inventory Average Pain 6 Pain Right Now 5 My pain is constant, burning and aching  In the last 24 hours, has pain interfered with the following? General activity 5 Relation with others 0 Enjoyment of life 0 What TIME of day is your pain at its worst? evening Sleep (in general) Good  Pain is worse with: walking, bending, sitting, standing and some activites Pain improves with: rest, heat/ice, therapy/exercise, pacing activities, medication, TENS and injections Relief from Meds: 5  Family History  Problem Relation Age of Onset  . Breast cancer Mother   . Diabetes Mother   . Heart disease Mother   . Allergies Mother   . Diabetes Father   . Heart disease Father   . Emphysema Father   . Allergies Father   . Stomach cancer Maternal Uncle   . Breast cancer Paternal Aunt   . Colon cancer Neg Hx    Social History   Socioeconomic History  . Marital status: Married    Spouse name: Not on file  . Number of children: Not on file  . Years of education: Not on file  . Highest education level: Not on file  Occupational History  . Occupation: Radiation protection practitioner  . Occupation: retired  Tobacco Use  . Smoking status: Never Smoker  . Smokeless tobacco: Never Used  Vaping Use  . Vaping Use: Never used  Substance  and Sexual Activity  . Alcohol use: Yes    Alcohol/week: 1.0 standard drink    Types: 1 Glasses of wine per week    Comment: rare  . Drug use: Never  . Sexual activity: Not Currently  Other Topics Concern  . Not on file  Social History Narrative  . Not on file   Social Determinants of Health   Financial Resource Strain: Low Risk   . Difficulty of Paying Living Expenses: Not hard at all  Food Insecurity: No Food Insecurity  . Worried About Charity fundraiser in the Last Year: Never true  . Ran Out of Food in the Last Year: Never true  Transportation Needs: No Transportation Needs  . Lack of Transportation (Medical): No  . Lack of Transportation (Non-Medical): No  Physical Activity: Inactive  . Days of Exercise per Week: 0 days  . Minutes of Exercise per Session: 0 min  Stress: No Stress Concern Present  . Feeling of Stress : Not at all  Social Connections:   . Frequency of Communication with Friends and Family: Not on file  . Frequency of Social Gatherings with Friends and Family: Not on file  . Attends Religious Services: Not on file  . Active Member of Clubs or Organizations: Not on file  . Attends Archivist Meetings: Not on file  . Marital Status: Not on file  Past Surgical History:  Procedure Laterality Date  . [place] STUDY N/A 01/04/2016   Procedure: [place] STUDY;  Surgeon: Manus Gunning, MD;  Location: [facility] ENDOSCOPY;  Service: Gastroenterology;  Laterality: N/A;  . ABDOMINAL HYSTERECTOMY  1988  . ADENOIDECTOMY    . ANTERIOR CERVICAL DECOMP/DISCECTOMY FUSION  2009, 1993   first procedure in ~ 1993/Dr Botero  . APPENDECTOMY  1988  . BACK SURGERY    . CARDIOVERSION N/A 04/03/2019   Procedure: CARDIOVERSION;  Surgeon: Dorothy Spark, MD;  Location: [facility] ENDOSCOPY;  Service: Cardiovascular;  Laterality: N/A;  . CHOLECYSTECTOMY  2005  . ESOPHAGEAL MANOMETRY N/A 01/04/2016   Procedure: ESOPHAGEAL MANOMETRY (EM) 24 HR PH;  Surgeon: Manus Gunning, MD;  Location: [facility] ENDOSCOPY;  Service: Gastroenterology;  Laterality: N/A;  . ESOPHAGOGASTRODUODENOSCOPY (EGD) WITH PROPOFOL N/A 05/17/2016   Procedure: ESOPHAGOGASTRODUODENOSCOPY (EGD) WITH PROPOFOL;  Surgeon: Manus Gunning, MD;  Location: [facility] ENDOSCOPY;  Service: Gastroenterology;  Laterality: N/A;  . NASAL SINUS SURGERY  2006  . SAVORY DILATION N/A 05/17/2016   Procedure: SAVORY DILATION;  Surgeon: Manus Gunning, MD;  Location: [facility];  Service: Gastroenterology;  Laterality: N/A;  . TEE WITHOUT CARDIOVERSION N/A 04/03/2019   Procedure: TRANSESOPHAGEAL ECHOCARDIOGRAM (TEE)-with DCCV;  Surgeon: Dorothy Spark, MD;  Location: [facility] ENDOSCOPY;; EF 55 to 60%.  No LVH.  Normal RV.  Moderate LA dilation.  No thrombus.  Mild RA dilation.  Mild to moderate TR.  Mild aortic valve sclerosis, no stenosis.  Moderately elevated PA pressures (47 mmHg)  . TONSILLECTOMY AND ADENOIDECTOMY    . TRANSTHORACIC ECHOCARDIOGRAM  2009   Normal LV size and function. EF 60-65%. No regional wall motion and amount is. Mild aortic sclerosis, no stenosis  . TUBAL LIGATION  1979   Past Surgical History:  Procedure Laterality Date  . [place] STUDY N/A 01/04/2016   Procedure: [place] STUDY;  Surgeon: Manus Gunning, MD;  Location: [facility] ENDOSCOPY;  Service: Gastroenterology;  Laterality: N/A;  . ABDOMINAL HYSTERECTOMY  1988  . ADENOIDECTOMY    . ANTERIOR CERVICAL DECOMP/DISCECTOMY FUSION  2009, 1993   first procedure in ~ 1993/Dr Botero  . APPENDECTOMY  1988  . BACK SURGERY    . CARDIOVERSION N/A 04/03/2019   Procedure: CARDIOVERSION;  Surgeon: Dorothy Spark, MD;  Location: [facility] ENDOSCOPY;  Service: Cardiovascular;  Laterality: N/A;  . CHOLECYSTECTOMY  2005  . ESOPHAGEAL MANOMETRY N/A 01/04/2016   Procedure: ESOPHAGEAL MANOMETRY (EM) 24 HR PH;  Surgeon: Manus Gunning, MD;  Location: [facility] ENDOSCOPY;  Service: Gastroenterology;  Laterality: N/A;  .  ESOPHAGOGASTRODUODENOSCOPY (EGD) WITH PROPOFOL N/A 05/17/2016   Procedure: ESOPHAGOGASTRODUODENOSCOPY (EGD) WITH PROPOFOL;  Surgeon: Manus Gunning, MD;  Location: [facility] ENDOSCOPY;  Service: Gastroenterology;  Laterality: N/A;  . NASAL SINUS SURGERY  2006  . SAVORY DILATION N/A 05/17/2016   Procedure: SAVORY DILATION;  Surgeon: Manus Gunning, MD;  Location: [facility] ENDOSCOPY;  Service: Gastroenterology;  Laterality: N/A;  . TEE WITHOUT CARDIOVERSION N/A 04/03/2019   Procedure: TRANSESOPHAGEAL ECHOCARDIOGRAM (TEE)-with DCCV;  Surgeon: Dorothy Spark, MD;  Location: [facility] ENDOSCOPY;; EF 55 to 60%.  No LVH.  Normal RV.  Moderate LA dilation.  No thrombus.  Mild RA dilation.  Mild to moderate TR.  Mild aortic valve sclerosis, no stenosis.  Moderately elevated PA pressures (47 mmHg)  . TONSILLECTOMY AND ADENOIDECTOMY    . TRANSTHORACIC ECHOCARDIOGRAM  2009   Normal LV size and function. EF 60-65%.  No regional wall motion and amount is. Mild aortic sclerosis, no stenosis  . TUBAL LIGATION  1979   Past Medical History:  Diagnosis Date  . Allergy   . Anemia   . Arthritis   . B12 deficiency   . Complication of anesthesia    slow to wake up   . Depression   . Diabetes mellitus type 2, diet-controlled ([place]) 07/24/2018  . Fibromyalgia   . Generalized hyperhidrosis 04/17/2014  . GERD (gastroesophageal reflux disease)   . Hyperlipemia   . HYPOTHYROIDISM 05/29/2007  . Irritable bowel syndrome   . Migraines    hx of   . OSA (obstructive sleep apnea) 06/26/2019  . Oxygen deficiency    2 L at bedtime  . PVC (premature ventricular contraction)   . Senile calcific aortic valve sclerosis    Aortic Sclerosis-no stenosis  . Unspecified essential hypertension    denies htn on 05/09/16   BP 103/69 Comment: pt reported, virtual visit  Pulse 76 Comment: pt reported, virtual visit  Temp (!) 97.2 F (36.2 C) Comment: pt reported, virtual visit  Ht 5\' 5"  (1.651 m) Comment: pt reported, virtual  visit  Wt 220 lb (99.8 kg) Comment: pt reported, virtual visit  SpO2 97% Comment: pt reported, virtual visit  BMI 36.61 kg/m   Opioid Risk Score:   Fall Risk Score:  `1  Depression screen PHQ 2/9  Depression screen [facility] 2/9 02/12/2020 12/18/2019 06/19/2019 01/18/2019 08/08/2018 04/24/2018 01/10/2018  Decreased Interest 0 0 1 0 0 0 2  Down, Depressed, Hopeless 0 0 1 0 0 0 0  PHQ - 2 Score 0 0 2 0 0 0 2  Altered sleeping 0 0 - 0 - - 0  Tired, decreased energy 0 0 - 3 - - 1  Change in appetite 0 0 - 0 - - 0  Feeling bad or failure about yourself  0 0 - 0 - - 0  Trouble concentrating 0 0 - 0 - - 1  Moving slowly or fidgety/restless 0 0 - 0 - - 0  Suicidal thoughts 0 0 - 0 - - 0  PHQ-9 Score 0 0 - 3 - - 4  Difficult doing work/chores Not difficult at all - - Not difficult at all - - Not difficult at all  Some recent data might be hidden     Review of Systems  Musculoskeletal: Positive for back pain and neck pain.       Knee pain  Neurological: Positive for tremors.  All other systems reviewed and are negative.      Objective:   Physical Exam Vitals and nursing note reviewed.  Musculoskeletal:     Comments: No Physical Exam Performed: Virtual Visit           Assessment & Plan:  1. Cervical postlaminectomy syndrome:/ Cervicalgia/ Cervical Radiculitis/with neck and shoulder pain/ Chronic Midline Thoracic Pain.Continue exercise routine and using heat therapy.02/19/2020 Continue :current medication regimen withMS Contin 15 mg one tablet three times a day #90.  We will continue the opioid monitoring program, this consists of regular clinic visits, examinations, urine drug screen, pill counts as well as use of [place] Controlled Substance Reporting system. A 12 month History has been reviewed on the [place] Controlled Substance Reporting System on 02/19/2020.   2. Fibromyalgia/ Neuropathy: Continuecurrent medication regimen withGabapentin, activity and  exercise regime.02/19/2020. 3.BilateralKnee Pain/ Mild Degenerative Changes:Continuecurrent medication regimen withVoltaren Gel.02/19/2020. 4.BilateralAnkle Pain:No complaints today.Continue HEP as tolerated. Continueto monitor.02/19/2020. 5. Muscle Spasm: Continuecurrent medication  regimen withRobaxin.Continue to Monitor.02/19/2020.. 6. Opioid Induced Constipation: No complaints Continue to Monitor.02/19/2020. 7. Midline Low Back Pain: Continue current medication regime, continue HEP as tolerated.02/19/2020. 8. Right Gluteal Medius Weakness and Atrophy: No complaints Today:Continue with Hip Abduction Exercisesas tolerated: Continue to Monitor.02/19/2020. 9. RightHip Pain/ Right Greater Trochanteric Tenderness:No complaints Today.Continue to Alternate Ice and Heat Therapy. Continue to Monitor.02/19/2020 10. Sacroiliac Joint Pain:No complaints today.S/PSacroiliac Injection on 08/24/2018 with good relief noted.02/19/2020 11.Lumbar Radiculitis:No complaints today.Continue Gabapentin.Continue HEP as tolerated. Continue current medication regimen. Continue to monitor.02/19/2020.  F/U in 1 month  Virtual Visit: My-Chart Video Established Patient Location of Patient: In Her Home Location of Provider: In the Office Total Time Spent: 10 Minutes

## 2020-02-20 ENCOUNTER — Ambulatory Visit: Payer: Medicare Other | Admitting: Registered Nurse

## 2020-03-11 DIAGNOSIS — G4733 Obstructive sleep apnea (adult) (pediatric): Secondary | ICD-10-CM | POA: Diagnosis not present

## 2020-03-11 DIAGNOSIS — J9610 Chronic respiratory failure, unspecified whether with hypoxia or hypercapnia: Secondary | ICD-10-CM | POA: Diagnosis not present

## 2020-03-12 ENCOUNTER — Other Ambulatory Visit (INDEPENDENT_AMBULATORY_CARE_PROVIDER_SITE_OTHER): Payer: Medicare Other

## 2020-03-12 ENCOUNTER — Other Ambulatory Visit: Payer: Self-pay

## 2020-03-12 DIAGNOSIS — E119 Type 2 diabetes mellitus without complications: Secondary | ICD-10-CM

## 2020-03-12 DIAGNOSIS — Z79899 Other long term (current) drug therapy: Secondary | ICD-10-CM

## 2020-03-12 DIAGNOSIS — E782 Mixed hyperlipidemia: Secondary | ICD-10-CM

## 2020-03-12 DIAGNOSIS — E538 Deficiency of other specified B group vitamins: Secondary | ICD-10-CM

## 2020-03-12 DIAGNOSIS — E559 Vitamin D deficiency, unspecified: Secondary | ICD-10-CM | POA: Diagnosis not present

## 2020-03-12 DIAGNOSIS — E039 Hypothyroidism, unspecified: Secondary | ICD-10-CM | POA: Diagnosis not present

## 2020-03-12 LAB — BASIC METABOLIC PANEL
BUN: 18 mg/dL (ref 6–23)
CO2: 31 mEq/L (ref 19–32)
Calcium: 9.7 mg/dL (ref 8.4–10.5)
Chloride: 100 mEq/L (ref 96–112)
Creatinine, Ser: 0.73 mg/dL (ref 0.40–1.20)
GFR: 79.2 mL/min (ref >60.00)
Glucose, Bld: 144 mg/dL — ABNORMAL HIGH (ref 70–99)
Potassium: 4 mEq/L (ref 3.5–5.1)
Sodium: 139 mEq/L (ref 135–145)

## 2020-03-12 LAB — HEPATIC FUNCTION PANEL
ALT: 34 U/L (ref 0–35)
AST: 33 U/L (ref 0–37)
Albumin: 4.3 g/dL (ref 3.5–5.2)
Alkaline Phosphatase: 105 U/L (ref 39–117)
Bilirubin, Direct: 0.1 mg/dL (ref 0.0–0.3)
Total Bilirubin: 0.4 mg/dL (ref 0.2–1.2)
Total Protein: 7.1 g/dL (ref 6.0–8.3)

## 2020-03-12 LAB — HEMOGLOBIN A1C: Hgb A1c MFr Bld: 8.2 % — ABNORMAL HIGH (ref 4.6–6.5)

## 2020-03-12 LAB — LIPID PANEL
Cholesterol: 188 mg/dL (ref 0–200)
HDL: 50.9 mg/dL (ref >39.00)
LDL Cholesterol: 108 mg/dL — ABNORMAL HIGH (ref 0–99)
NonHDL: 136.67
Total CHOL/HDL Ratio: 4
Triglycerides: 142 mg/dL (ref 0.0–149.0)
VLDL: 28.4 mg/dL (ref 0.0–40.0)

## 2020-03-12 LAB — VITAMIN B12: Vitamin B-12: 873 pg/mL (ref 211–911)

## 2020-03-12 LAB — CBC WITH DIFFERENTIAL/PLATELET
Basophils Absolute: 0 10*3/uL (ref 0.0–0.1)
Basophils Relative: 0.3 % (ref 0.0–3.0)
Eosinophils Absolute: 0.1 10*3/uL (ref 0.0–0.7)
Eosinophils Relative: 1.6 % (ref 0.0–5.0)
HCT: 35.9 % — ABNORMAL LOW (ref 36.0–46.0)
Hemoglobin: 12.1 g/dL (ref 12.0–15.0)
Lymphocytes Relative: 30.6 % (ref 12.0–46.0)
Lymphs Abs: 1.9 10*3/uL (ref 0.7–4.0)
MCHC: 33.6 g/dL (ref 30.0–36.0)
MCV: 88.2 fl (ref 78.0–100.0)
Monocytes Absolute: 0.3 10*3/uL (ref 0.1–1.0)
Monocytes Relative: 5.5 % (ref 3.0–12.0)
Neutro Abs: 3.8 10*3/uL (ref 1.4–7.7)
Neutrophils Relative %: 62 % (ref 43.0–77.0)
Platelets: 236 10*3/uL (ref 150.0–400.0)
RBC: 4.08 Mil/uL (ref 3.87–5.11)
RDW: 12.7 % (ref 11.5–15.5)
WBC: 6.1 10*3/uL (ref 4.0–10.5)

## 2020-03-12 LAB — VITAMIN D 25 HYDROXY (VIT D DEFICIENCY, FRACTURES): VITD: 33.52 ng/mL (ref 30.00–100.00)

## 2020-03-12 LAB — TSH: TSH: 4.39 u[IU]/mL (ref 0.35–4.50)

## 2020-03-12 LAB — T3, FREE: T3, Free: 4.7 pg/mL — ABNORMAL HIGH (ref 2.3–4.2)

## 2020-03-12 LAB — T4, FREE: Free T4: 0.88 ng/dL (ref 0.60–1.60)

## 2020-03-13 LAB — MICROALBUMIN / CREATININE URINE RATIO
Creatinine,U: 232.6 mg/dL
Microalb Creat Ratio: 2.4 mg/g (ref 0.0–30.0)
Microalb, Ur: 5.5 mg/dL — ABNORMAL HIGH (ref 0.0–1.9)

## 2020-03-14 ENCOUNTER — Other Ambulatory Visit: Payer: Self-pay | Admitting: Family Medicine

## 2020-03-16 ENCOUNTER — Ambulatory Visit (INDEPENDENT_AMBULATORY_CARE_PROVIDER_SITE_OTHER): Payer: Medicare Other | Admitting: Family Medicine

## 2020-03-16 ENCOUNTER — Other Ambulatory Visit: Payer: Self-pay

## 2020-03-16 ENCOUNTER — Encounter: Payer: Self-pay | Admitting: Family Medicine

## 2020-03-16 VITALS — BP 124/70 | HR 70 | Temp 98.5°F | Ht 64.65 in | Wt 225.2 lb

## 2020-03-16 DIAGNOSIS — Z0000 Encounter for general adult medical examination without abnormal findings: Secondary | ICD-10-CM | POA: Diagnosis not present

## 2020-03-16 NOTE — Patient Instructions (Signed)
Get your 3rd Covid vaccine

## 2020-03-16 NOTE — Progress Notes (Signed)
Ebrahim Deremer T. Makiya Jeune, MD, [place] [facility] [place], [address]  Phone: [phone]  FAX: [phone]  [name] - 69 y.o. female  MRN [id number]  Date of Birth: 03/28/1951  Date: 03/16/2020  PCP: Owens Loffler, MD  Referral: Owens Loffler, MD  Chief Complaint  Patient presents with  . Annual Exam    Part 2    This visit occurred during the SARS-CoV-2 public health emergency.  Safety protocols were in place, including screening questions prior to the visit, additional usage of staff PPE, and extensive cleaning of exam room while observing appropriate contact time as indicated for disinfecting solutions.   Patient Care Team: Owens Loffler, MD as PCP - General [name], MD as PCP - Cardiology (Cardiology) Letta Pate Luanna Salk, MD as Consulting Physician (Physical Medicine and Rehabilitation) Armbruster, Carlota Raspberry, MD as Consulting Physician (Gastroenterology) [name], MD as Consulting Physician (Cardiology) Chesley Mires, MD as Consulting Physician (Pulmonary Disease) Debbora Dus, [facility] as Pharmacist (Pharmacist) Subjective:   [name] is a 69 y.o. pleasant patient who presents with the following:  Health Maintenance Summary Reviewed and updated, unless pt declines services.  Tobacco History Reviewed. Non-smoker Alcohol: No concerns, no excessive use Exercise Habits: Some activity, rec at least 30 mins 5 times a week STD concerns: none Drug Use: None Lumps or breast concerns: no  Flu shot  Husband d with met colon cancer.  This is been very hard on her emotionally and physically.  She has been eating not quite as well.  She does have diabetes and her blood sugars are elevated recently, and she knows that she has not been doing a good job with her diabetic diet.  08/2019 Covid vaccine   She is here for a general health maintenance  exam, and she also has diabetes, hyperlipidemia, hypothyroidism, B12 deficiency.  Free T3 is very slightly high, but TSH and free T4 are normal.  Her A1c is now 8.  She had previously been able to handle this with diet alone.  She is currently not taking any diabetes medications.  Results for orders placed or performed in visit on 03/12/20  T3, free  Result Value Ref Range   T3, Free 4.7 (H) 2.3 - 4.2 pg/mL  TSH  Result Value Ref Range   TSH 4.39 0.35 - 4.50 uIU/mL  Vitamin B12  Result Value Ref Range   Vitamin B-12 873 211 - 911 pg/mL  VITAMIN D 25 Hydroxy (Vit-D Deficiency, Fractures)  Result Value Ref Range   VITD 33.52 30.00 - 100.00 ng/mL  Basic metabolic panel  Result Value Ref Range   Sodium 139 135 - 145 mEq/L   Potassium 4.0 3.5 - 5.1 mEq/L   Chloride 100 96 - 112 mEq/L   CO2 31 19 - 32 mEq/L   Glucose, Bld 144 (H) 70 - 99 mg/dL   BUN 18 6 - 23 mg/dL   Creatinine, Ser 0.73 0.40 - 1.20 mg/dL   GFR 79.20 >60.00 mL/min   Calcium 9.7 8.4 - 10.5 mg/dL  Hepatic function panel  Result Value Ref Range   Total Bilirubin 0.4 0.2 - 1.2 mg/dL   Bilirubin, Direct 0.1 0.0 - 0.3 mg/dL   Alkaline Phosphatase 105 39 - 117 U/L   AST 33 0 - 37 U/L   ALT 34 0 - 35 U/L   Total Protein 7.1 6.0 - 8.3 g/dL  Albumin 4.3 3.5 - 5.2 g/dL  CBC with Differential/Platelet  Result Value Ref Range   WBC 6.1 4.0 - 10.5 K/uL   RBC 4.08 3.87 - 5.11 Mil/uL   Hemoglobin 12.1 12.0 - 15.0 g/dL   HCT 35.9 (L) 36 - 46 %   MCV 88.2 78.0 - 100.0 fl   MCHC 33.6 30.0 - 36.0 g/dL   RDW 12.7 11.5 - 15.5 %   Platelets 236.0 150 - 400 K/uL   Neutrophils Relative % 62.0 43 - 77 %   Lymphocytes Relative 30.6 12 - 46 %   Monocytes Relative 5.5 3 - 12 %   Eosinophils Relative 1.6 0 - 5 %   Basophils Relative 0.3 0 - 3 %   Neutro Abs 3.8 1.4 - 7.7 K/uL   Lymphs Abs 1.9 0.7 - 4.0 K/uL   Monocytes Absolute 0.3 0 - 1 K/uL   Eosinophils Absolute 0.1 0 - 0 K/uL   Basophils Absolute 0.0 0 - 0 K/uL   Microalbumin / creatinine urine ratio  Result Value Ref Range   Microalb, Ur 5.5 (H) 0.0 - 1.9 mg/dL   Creatinine,U 232.6 mg/dL   Microalb Creat Ratio 2.4 0.0 - 30.0 mg/g  Hemoglobin A1c  Result Value Ref Range   Hgb A1c MFr Bld 8.2 (H) 4.6 - 6.5 %  Lipid panel  Result Value Ref Range   Cholesterol 188 0 - 200 mg/dL   Triglycerides 142.0 0 - 149 mg/dL   HDL 50.90 >39.00 mg/dL   VLDL 28.4 0.0 - 40.0 mg/dL   LDL Cholesterol 108 (H) 0 - 99 mg/dL   Total CHOL/HDL Ratio 4    NonHDL 136.67   T4, free  Result Value Ref Range   Free T4 0.88 0.60 - 1.60 ng/dL     Health Maintenance  Topic Date Due  . FOOT EXAM  11/30/2018  . INFLUENZA VACCINE  01/26/2020  . OPHTHALMOLOGY EXAM  07/01/2020  . HEMOGLOBIN A1C  09/09/2020  . URINE MICROALBUMIN  03/12/2021  . Fecal DNA (Cologuard)  08/05/2021  . MAMMOGRAM  11/07/2021  . TETANUS/TDAP  10/17/2023  . DEXA SCAN  Completed  . COVID-19 Vaccine  Completed  . Hepatitis C Screening  Completed  . PNA vac Low Risk Adult  Completed    Immunization History  Administered Date(s) Administered  . PFIZER SARS-COV-2 Vaccination 08/22/2019, 09/18/2019  . Pneumococcal Conjugate-13 01/02/2017  . Pneumococcal Polysaccharide-23 05/06/2013, 07/25/2018  . Td 06/28/2003, 10/16/2013  . Zoster 11/07/2012   Patient Active Problem List   Diagnosis Date Noted  . Postlaminectomy syndrome, cervical region 08/29/2011    Priority: High  . FIBROMYALGIA 01/11/2007    Priority: High  . Depression, major, recurrent, in partial remission ([place]) 09/22/2008    Priority: Medium  . Hypothyroidism 05/29/2007    Priority: Medium  . HYPERLIPIDEMIA 05/29/2007    Priority: Medium  . Essential hypertension 11/13/2006    Priority: Medium  . Secondary hypercoagulable state ([place]) 08/29/2019  . OSA (obstructive sleep apnea) 06/26/2019  . Paroxysmal atrial fibrillation ([place]) 05/10/2019  . Atrial fibrillation with RVR ([place]) 04/03/2019  . Flutter-fibrillation ([place])  04/01/2019  . Diabetes mellitus type 2, diet-controlled ([place]) 07/24/2018  . Obesity hypoventilation syndrome ([place]) 03/26/2015  . Generalized hyperhidrosis 04/17/2014  . Obesity (BMI 30-39.9) 08/23/2013  . 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 ALLERGIC RHINITIS 09/22/2008  . Vitamin D deficiency 01/11/2007  . Vitamin B 12 deficiency 11/13/2006  . GERD 11/13/2006    Past Medical  History:  Diagnosis Date  . Allergy   . Anemia   . Arthritis   . B12 deficiency   . Complication of anesthesia    slow to wake up   . Depression   . Diabetes mellitus type 2, diet-controlled ([place]) 07/24/2018  . Fibromyalgia   . Generalized hyperhidrosis 04/17/2014  . GERD (gastroesophageal reflux disease)   . Hyperlipemia   . HYPOTHYROIDISM 05/29/2007  . Irritable bowel syndrome   . Migraines    hx of   . OSA (obstructive sleep apnea) 06/26/2019  . Oxygen deficiency    2 L at bedtime  . PVC (premature ventricular contraction)   . Senile calcific aortic valve sclerosis    Aortic Sclerosis-no stenosis  . Unspecified essential hypertension    denies htn on 05/09/16    Past Surgical History:  Procedure Laterality Date  . [place] STUDY N/A 01/04/2016   Procedure: [place] STUDY;  Surgeon: Manus Gunning, MD;  Location: [facility] ENDOSCOPY;  Service: Gastroenterology;  Laterality: N/A;  . ABDOMINAL HYSTERECTOMY  1988  . ADENOIDECTOMY    . ANTERIOR CERVICAL DECOMP/DISCECTOMY FUSION  2009, 1993   first procedure in ~ 1993/Dr Botero  . APPENDECTOMY  1988  . BACK SURGERY    . CARDIOVERSION N/A 04/03/2019   Procedure: CARDIOVERSION;  Surgeon: Dorothy Spark, MD;  Location: [facility] ENDOSCOPY;  Service: Cardiovascular;  Laterality: N/A;  . CHOLECYSTECTOMY  2005  . ESOPHAGEAL MANOMETRY N/A 01/04/2016   Procedure: ESOPHAGEAL MANOMETRY (EM) 24 HR PH;  Surgeon: Manus Gunning, MD;  Location: [facility] ENDOSCOPY;  Service: Gastroenterology;  Laterality: N/A;  .  ESOPHAGOGASTRODUODENOSCOPY (EGD) WITH PROPOFOL N/A 05/17/2016   Procedure: ESOPHAGOGASTRODUODENOSCOPY (EGD) WITH PROPOFOL;  Surgeon: Manus Gunning, MD;  Location: [facility] ENDOSCOPY;  Service: Gastroenterology;  Laterality: N/A;  . NASAL SINUS SURGERY  2006  . SAVORY DILATION N/A 05/17/2016   Procedure: SAVORY DILATION;  Surgeon: Manus Gunning, MD;  Location: [facility] ENDOSCOPY;  Service: Gastroenterology;  Laterality: N/A;  . TEE WITHOUT CARDIOVERSION N/A 04/03/2019   Procedure: TRANSESOPHAGEAL ECHOCARDIOGRAM (TEE)-with DCCV;  Surgeon: Dorothy Spark, MD;  Location: [facility]COPY;; EF 55 to 60%.  No LVH.  Normal RV.  Moderate LA dilation.  No thrombus.  Mild RA dilation.  Mild to moderate TR.  Mild aortic valve sclerosis, no stenosis.  Moderately elevated PA pressures (47 mmHg)  . TONSILLECTOMY AND ADENOIDECTOMY    . TRANSTHORACIC ECHOCARDIOGRAM  2009   Normal LV size and function. EF 60-65%. No regional wall motion and amount is. Mild aortic sclerosis, no stenosis  . TUBAL LIGATION  1979    Family History  Problem Relation Age of Onset  . Breast cancer Mother   . Diabetes Mother   . Heart disease Mother   . Allergies Mother   . Diabetes Father   . Heart disease Father   . Emphysema Father   . Allergies Father   . Stomach cancer Maternal Uncle   . Breast cancer Paternal Aunt   . Colon cancer Neg Hx     Past Medical History, Surgical History, Social History, Family History, Problem List, Medications, and Allergies have been reviewed and updated if relevant.  Review of Systems: Pertinent positives are listed above.  Otherwise, a full 14 point review of systems has been done in full and it is negative except where it is noted positive.  Objective:   BP 124/70   Pulse 70   Temp 98.5 F (36.9 C) (Temporal)   Ht  5' 4.65" (1.642 m)   Wt 225 lb 4 oz (102.2 kg)   SpO2 96%   BMI 37.90 kg/m  Ideal Body Weight: Weight in (lb) to have BMI = 25: 148.3 No exam data  present Depression screen [facility] 2/9 02/12/2020 12/18/2019 06/19/2019 01/18/2019 08/08/2018  Decreased Interest 0 0 1 0 0  Down, Depressed, Hopeless 0 0 1 0 0  PHQ - 2 Score 0 0 2 0 0  Altered sleeping 0 0 - 0 -  Tired, decreased energy 0 0 - 3 -  Change in appetite 0 0 - 0 -  Feeling bad or failure about yourself  0 0 - 0 -  Trouble concentrating 0 0 - 0 -  Moving slowly or fidgety/restless 0 0 - 0 -  Suicidal thoughts 0 0 - 0 -  PHQ-9 Score 0 0 - 3 -  Difficult doing work/chores Not difficult at all - - Not difficult at all -  Some recent data might be hidden     GEN: well developed, well nourished, no acute distress Eyes: conjunctiva and lids normal, PERRLA, EOMI ENT: TM clear, nares clear, oral exam WNL Neck: supple, no lymphadenopathy, no thyromegaly, no JVD Pulm: clear to auscultation and percussion, respiratory effort normal CV: regular rate and rhythm, S1-S2, no murmur, rub or gallop, no bruits Chest: no scars, masses, no lumps BREAST: breast exam declined GI: soft, non-tender; no hepatosplenomegaly, masses; active bowel sounds all quadrants GU: GU exam declined Lymph: no cervical, axillary or inguinal adenopathy MSK: gait normal, muscle tone and strength WNL, no joint swelling, effusions, discoloration, crepitus  SKIN: clear, good turgor, color WNL, no rashes, lesions, or ulcerations Neuro: normal mental status, normal strength, sensation, and motion Psych: alert; oriented to person, place and time, normally interactive and not anxious or depressed in appearance.   All labs reviewed with patient. Results for orders placed or performed in visit on 03/12/20  T3, free  Result Value Ref Range   T3, Free 4.7 (H) 2.3 - 4.2 pg/mL  TSH  Result Value Ref Range   TSH 4.39 0.35 - 4.50 uIU/mL  Vitamin B12  Result Value Ref Range   Vitamin B-12 873 211 - 911 pg/mL  VITAMIN D 25 Hydroxy (Vit-D Deficiency, Fractures)  Result Value Ref Range   VITD 33.52 30.00 - 100.00 ng/mL  Basic  metabolic panel  Result Value Ref Range   Sodium 139 135 - 145 mEq/L   Potassium 4.0 3.5 - 5.1 mEq/L   Chloride 100 96 - 112 mEq/L   CO2 31 19 - 32 mEq/L   Glucose, Bld 144 (H) 70 - 99 mg/dL   BUN 18 6 - 23 mg/dL   Creatinine, Ser 0.73 0.40 - 1.20 mg/dL   GFR 79.20 >60.00 mL/min   Calcium 9.7 8.4 - 10.5 mg/dL  Hepatic function panel  Result Value Ref Range   Total Bilirubin 0.4 0.2 - 1.2 mg/dL   Bilirubin, Direct 0.1 0.0 - 0.3 mg/dL   Alkaline Phosphatase 105 39 - 117 U/L   AST 33 0 - 37 U/L   ALT 34 0 - 35 U/L   Total Protein 7.1 6.0 - 8.3 g/dL   Albumin 4.3 3.5 - 5.2 g/dL  CBC with Differential/Platelet  Result Value Ref Range   WBC 6.1 4.0 - 10.5 K/uL   RBC 4.08 3.87 - 5.11 Mil/uL   Hemoglobin 12.1 12.0 - 15.0 g/dL   HCT 35.9 (L) 36 - 46 %   MCV 88.2 78.0 -  100.0 fl   MCHC 33.6 30.0 - 36.0 g/dL   RDW 12.7 11.5 - 15.5 %   Platelets 236.0 150 - 400 K/uL   Neutrophils Relative % 62.0 43 - 77 %   Lymphocytes Relative 30.6 12 - 46 %   Monocytes Relative 5.5 3 - 12 %   Eosinophils Relative 1.6 0 - 5 %   Basophils Relative 0.3 0 - 3 %   Neutro Abs 3.8 1.4 - 7.7 K/uL   Lymphs Abs 1.9 0.7 - 4.0 K/uL   Monocytes Absolute 0.3 0 - 1 K/uL   Eosinophils Absolute 0.1 0 - 0 K/uL   Basophils Absolute 0.0 0 - 0 K/uL  Microalbumin / creatinine urine ratio  Result Value Ref Range   Microalb, Ur 5.5 (H) 0.0 - 1.9 mg/dL   Creatinine,U 232.6 mg/dL   Microalb Creat Ratio 2.4 0.0 - 30.0 mg/g  Hemoglobin A1c  Result Value Ref Range   Hgb A1c MFr Bld 8.2 (H) 4.6 - 6.5 %  Lipid panel  Result Value Ref Range   Cholesterol 188 0 - 200 mg/dL   Triglycerides 142.0 0 - 149 mg/dL   HDL 50.90 >39.00 mg/dL   VLDL 28.4 0.0 - 40.0 mg/dL   LDL Cholesterol 108 (H) 0 - 99 mg/dL   Total CHOL/HDL Ratio 4    NonHDL 136.67   T4, free  Result Value Ref Range   Free T4 0.88 0.60 - 1.60 ng/dL   No results found.  Assessment and Plan:     ICD-10-CM   1. Healthcare maintenance  Z00.00    I  counseled her the best I could about her husband stage IV colon cancer.  Diabetes recheck, 3 months  Health Maintenance Exam: The patient's preventative maintenance and recommended screening tests for an annual wellness exam were reviewed in full today. Brought up to date unless services declined.  Counselled on the importance of diet, exercise, and its role in overall health and mortality. The patient's FH and SH was reviewed, including their home life, tobacco status, and drug and alcohol status.  Follow-up in 1 year for physical exam or additional follow-up below.  Follow-up: Return in about 3 months (around 06/15/2020) for Diabetes follow-up. Or follow-up in 1 year if not noted.  Future Appointments  Date Time Provider [place]  03/18/2020  1:40 PM Bayard Hugger, NP [facility] CPR  04/22/2020  2:00 PM [place], [place] [facility] None  05/13/2020 11:00 AM [facility] PHARMACIST [facility]  06/02/2020  1:40 PM [name], MD [facility]  06/15/2020 11:20 AM Dashon Mcintire, Frederico Hamman, MD [facility]    No orders of the defined types were placed in this encounter.  Medications Discontinued During This Encounter  Medication Reason  . citalopram (CELEXA) 40 MG tablet Completed Course  . oxybutynin (DITROPAN-XL) 10 MG 24 hr tablet Completed Course   No orders of the defined types were placed in this encounter.   Signed,  Maud Deed. Keylah Darwish, MD   Allergies as of 03/16/2020      Reactions   Cyclobenzaprine Hcl Anaphylaxis, Swelling   Reaction - throat closed, tongue swelling   Rizatriptan Benzoate Anaphylaxis   Throat and Tongue Swelling Closed   Sumatriptan Swelling   Throat closed & Tongue Swelling   Tizanidine Shortness Of Breath   Aleve [naproxen Sodium] Hives   Clarithromycin Hives   Doxycycline Diarrhea, Nausea And Vomiting   Duloxetine Nausea Only   Egg [eggs Or Egg-derived Products] Diarrhea, Nausea And  Vomiting   Keflex [cephalexin] Itching,  Other (See Comments)   Sores   Metronidazole Diarrhea, Nausea Only   Oxycodone Hcl Diarrhea, Nausea And Vomiting   Oxycontin [oxycodone Hcl] Nausea And Vomiting   Penicillins Other (See Comments)   Unknown reaction Did it involve swelling of the face/tongue/throat, SOB, or low BP? Unknown Did it involve sudden or severe rash/hives, skin peeling, or any reaction on the inside of your mouth or nose? Unknown Did you need to seek medical attention at a hospital or doctor's office? Unknown When did it last happen?infant If all above answers are "NO", may proceed with cephalosporin use.   Rosuvastatin Other (See Comments)   Muscle aches   Venlafaxine Hives   Voltaren [diclofenac Sodium] Hives   Reaction to oral med, uses gel occasionally   Septra [sulfamethoxazole-trimethoprim] Itching, Rash   Tramadol Diarrhea, Nausea And Vomiting, Palpitations      Medication List       Accurate as of March 16, 2020 11:59 PM. If you have any questions, ask your nurse or doctor.        STOP taking these medications   citalopram 40 MG tablet Commonly known as: CELEXA Stopped by: Owens Loffler, MD     TAKE these medications   acetaminophen 650 MG CR tablet Commonly known as: TYLENOL Take 650 mg by mouth in the morning and at bedtime.   B COMPLEX 100 PO Take 1 tablet by mouth every morning.   bisacodyl 5 MG EC tablet Commonly known as: DULCOLAX Take 10 mg by mouth at bedtime.   CALCIUM 600+D PO Take 1 tablet by mouth in the morning and at bedtime.   calcium carbonate 750 MG chewable tablet Commonly known as: TUMS EX Chew 1-2 tablets by mouth 2 (two) times daily as needed for heartburn (acid reflux).   carboxymethylcellulose 0.5 % Soln Commonly known as: REFRESH PLUS Place 1 drop into both eyes 3 (three) times daily as needed (dry eyes).   cetirizine 10 MG tablet Commonly known as: ZYRTEC Take 10 mg by mouth at bedtime.   clindamycin 1 % lotion Commonly known as:  CLEOCIN T Apply 1 application topically daily as needed (eczema).   Coenzyme Q-10 100 MG capsule Take 100 mg by mouth every morning.   diclofenac sodium 1 % Gel Commonly known as: VOLTAREN Apply 4 g topically 4 (four) times daily as needed (joint pain).   diltiazem 240 MG 24 hr capsule Commonly known as: CARDIZEM CD TAKE 1 CAPSULE BY MOUTH  DAILY   docusate sodium 250 MG capsule Commonly known as: COLACE Take 250 mg by mouth at bedtime.   Eliquis 5 MG Tabs tablet Generic drug: apixaban TAKE 1 TABLET BY MOUTH  TWICE DAILY   flecainide 50 MG tablet Commonly known as: TAMBOCOR TAKE 1 TABLET BY MOUTH  TWICE DAILY   fluocinonide 0.05 % external solution Commonly known as: LIDEX Apply 1 application topically daily as needed (eczema on scalp).   gabapentin 300 MG capsule Commonly known as: NEURONTIN TAKE 1 CAPSULE BY MOUTH 3  TIMES DAILY   GRAPE SEED EXTRACT PO Take 100 mg by mouth 2 (two) times daily.   levothyroxine 50 MCG tablet Commonly known as: SYNTHROID TAKE 1 TABLET BY MOUTH  DAILY   loratadine 10 MG tablet Commonly known as: CLARITIN Take 10 mg by mouth every morning.   MAG GLYCINATE PO Take 400 mg by mouth at bedtime.   methocarbamol 500 MG tablet Commonly known as: ROBAXIN TAKE ONE (1) TABLET  BY MOUTH EVERY EIGHTHOURS AS NEEDED FOR MUSCLE SPASMS   mirtazapine 15 MG tablet Commonly known as: REMERON TAKE 1 TABLET BY MOUTH AT  BEDTIME   morphine 15 MG 12 hr tablet Commonly known as: MS Contin Take 1 tablet (15 mg total) by mouth 3 (three) times daily. Do Not Fill Before 02/14/2020   nitroGLYCERIN 0.4 MG SL tablet Commonly known as: NITROSTAT Place 1 tablet (0.4 mg total) under the tongue every 5 (five) minutes as needed for chest pain.   oxybutynin 5 MG 24 hr tablet Commonly known as: DITROPAN-XL Take 1 tablet (5 mg total) by mouth at bedtime. What changed: Another medication with the same name was removed. Continue taking this medication, and  follow the directions you see here. Changed by: Owens Loffler, MD   oxymetazoline 0.05 % nasal spray Commonly known as: AFRIN Place 1 spray into both nostrils 2 (two) times daily as needed for congestion.   RABEprazole 20 MG tablet Commonly known as: ACIPHEX TAKE 1 TABLET BY MOUTH EVERY DAY   tretinoin 0.05 % cream Commonly known as: RETIN-A Apply 1 application topically daily as needed (acne).   vitamin B-12 1000 MCG tablet Commonly known as: CYANOCOBALAMIN Take 1,000 mcg by mouth daily.   Vitamin D 125 MCG (5000 UT) Caps Take 5,000 Units by mouth every morning.

## 2020-03-18 ENCOUNTER — Encounter: Payer: Medicare Other | Attending: Physical Medicine & Rehabilitation | Admitting: Registered Nurse

## 2020-03-18 ENCOUNTER — Other Ambulatory Visit: Payer: Self-pay

## 2020-03-18 ENCOUNTER — Encounter: Payer: Self-pay | Admitting: Registered Nurse

## 2020-03-18 VITALS — BP 147/73 | HR 67 | Temp 98.5°F | Ht 64.5 in | Wt 225.4 lb

## 2020-03-18 DIAGNOSIS — Z79899 Other long term (current) drug therapy: Secondary | ICD-10-CM | POA: Insufficient documentation

## 2020-03-18 DIAGNOSIS — Z79891 Long term (current) use of opiate analgesic: Secondary | ICD-10-CM

## 2020-03-18 DIAGNOSIS — Z5181 Encounter for therapeutic drug level monitoring: Secondary | ICD-10-CM | POA: Diagnosis not present

## 2020-03-18 DIAGNOSIS — G8929 Other chronic pain: Secondary | ICD-10-CM

## 2020-03-18 DIAGNOSIS — R0781 Pleurodynia: Secondary | ICD-10-CM | POA: Diagnosis not present

## 2020-03-18 DIAGNOSIS — M961 Postlaminectomy syndrome, not elsewhere classified: Secondary | ICD-10-CM | POA: Diagnosis not present

## 2020-03-18 DIAGNOSIS — G894 Chronic pain syndrome: Secondary | ICD-10-CM | POA: Insufficient documentation

## 2020-03-18 DIAGNOSIS — M25551 Pain in right hip: Secondary | ICD-10-CM | POA: Insufficient documentation

## 2020-03-18 DIAGNOSIS — M546 Pain in thoracic spine: Secondary | ICD-10-CM

## 2020-03-18 DIAGNOSIS — M170 Bilateral primary osteoarthritis of knee: Secondary | ICD-10-CM

## 2020-03-18 MED ORDER — MORPHINE SULFATE ER 15 MG PO TBCR
15.0000 mg | EXTENDED_RELEASE_TABLET | Freq: Three times a day (TID) | ORAL | 0 refills | Status: DC
Start: 2020-03-18 — End: 2020-04-17

## 2020-03-18 NOTE — Progress Notes (Signed)
Subjective:    Patient ID: [name], female    DOB: February 02, 1951, 69 y.o.   MRN: [id number]  HPI: [name] is a 69 y.o. female who returns for follow up appointment for chronic pain and medication refill. She states her pain is located in her mid- back and left knee pain. She  rates her pain 5. Her  current exercise regime is walking and performing stretching exercises.  Ms. Klinger husband was diagnosed with colon cancer, emotional support given.   Ms. Soldo Morphine equivalent is 45.00  MME.  Last UDS was Performed on 10/09/2019, it was consistent.    Pain Inventory Average Pain 6 Pain Right Now 5 My pain is constant, burning and aching  In the last 24 hours, has pain interfered with the following? General activity 7 Relation with others 7 Enjoyment of life 7 What TIME of day is your pain at its worst? evening and night Sleep (in general) Good  Pain is worse with: walking, bending, standing and some activites Pain improves with: rest, heat/ice and medication Relief from Meds: 4  Family History  Problem Relation Age of Onset  . Breast cancer Mother   . Diabetes Mother   . Heart disease Mother   . Allergies Mother   . Diabetes Father   . Heart disease Father   . Emphysema Father   . Allergies Father   . Stomach cancer Maternal Uncle   . Breast cancer Paternal Aunt   . Colon cancer Neg Hx    Social History   Socioeconomic History  . Marital status: Married    Spouse name: Not on file  . Number of children: Not on file  . Years of education: Not on file  . Highest education level: Not on file  Occupational History  . Occupation: Radiation protection practitioner  . Occupation: retired  Tobacco Use  . Smoking status: Never Smoker  . Smokeless tobacco: Never Used  Vaping Use  . Vaping Use: Never used  Substance and Sexual Activity  . Alcohol use: Yes    Alcohol/week: 1.0 standard drink    Types: 1 Glasses of wine per week    Comment: rare  . Drug use: Never  . Sexual  activity: Not Currently  Other Topics Concern  . Not on file  Social History Narrative  . Not on file   Social Determinants of Health   Financial Resource Strain: Low Risk   . Difficulty of Paying Living Expenses: Not hard at all  Food Insecurity: No Food Insecurity  . Worried About Charity fundraiser in the Last Year: Never true  . Ran Out of Food in the Last Year: Never true  Transportation Needs: No Transportation Needs  . Lack of Transportation (Medical): No  . Lack of Transportation (Non-Medical): No  Physical Activity: Inactive  . Days of Exercise per Week: 0 days  . Minutes of Exercise per Session: 0 min  Stress: No Stress Concern Present  . Feeling of Stress : Not at all  Social Connections:   . Frequency of Communication with Friends and Family: Not on file  . Frequency of Social Gatherings with Friends and Family: Not on file  . Attends Religious Services: Not on file  . Active Member of Clubs or Organizations: Not on file  . Attends Archivist Meetings: Not on file  . Marital Status: Not on file   Past Surgical History:  Procedure Laterality Date  . [place] STUDY N/A 01/04/2016  Procedure: [place] STUDY;  Surgeon: Manus Gunning, MD;  Location: [facility] ENDOSCOPY;  Service: Gastroenterology;  Laterality: N/A;  . ABDOMINAL HYSTERECTOMY  1988  . ADENOIDECTOMY    . ANTERIOR CERVICAL DECOMP/DISCECTOMY FUSION  2009, 1993   first procedure in ~ 1993/Dr Botero  . APPENDECTOMY  1988  . BACK SURGERY    . CARDIOVERSION N/A 04/03/2019   Procedure: CARDIOVERSION;  Surgeon: Dorothy Spark, MD;  Location: [facility] ENDOSCOPY;  Service: Cardiovascular;  Laterality: N/A;  . CHOLECYSTECTOMY  2005  . ESOPHAGEAL MANOMETRY N/A 01/04/2016   Procedure: ESOPHAGEAL MANOMETRY (EM) 24 HR PH;  Surgeon: Manus Gunning, MD;  Location: [facility];  Service: Gastroenterology;  Laterality: N/A;  . ESOPHAGOGASTRODUODENOSCOPY (EGD) WITH PROPOFOL N/A 05/17/2016    Procedure: ESOPHAGOGASTRODUODENOSCOPY (EGD) WITH PROPOFOL;  Surgeon: Manus Gunning, MD;  Location: [facility];  Service: Gastroenterology;  Laterality: N/A;  . NASAL SINUS SURGERY  2006  . SAVORY DILATION N/A 05/17/2016   Procedure: SAVORY DILATION;  Surgeon: Manus Gunning, MD;  Location: [facility];  Service: Gastroenterology;  Laterality: N/A;  . TEE WITHOUT CARDIOVERSION N/A 04/03/2019   Procedure: TRANSESOPHAGEAL ECHOCARDIOGRAM (TEE)-with DCCV;  Surgeon: Dorothy Spark, MD;  Location: [facility]OSCOPY;; EF 55 to 60%.  No LVH.  Normal RV.  Moderate LA dilation.  No thrombus.  Mild RA dilation.  Mild to moderate TR.  Mild aortic valve sclerosis, no stenosis.  Moderately elevated PA pressures (47 mmHg)  . TONSILLECTOMY AND ADENOIDECTOMY    . TRANSTHORACIC ECHOCARDIOGRAM  2009   Normal LV size and function. EF 60-65%. No regional wall motion and amount is. Mild aortic sclerosis, no stenosis  . TUBAL LIGATION  1979   Past Surgical History:  Procedure Laterality Date  . [place] STUDY N/A 01/04/2016   Procedure: [place] STUDY;  Surgeon: Manus Gunning, MD;  Location: [facility];  Service: Gastroenterology;  Laterality: N/A;  . ABDOMINAL HYSTERECTOMY  1988  . ADENOIDECTOMY    . ANTERIOR CERVICAL DECOMP/DISCECTOMY FUSION  2009, 1993   first procedure in ~ 1993/Dr Botero  . APPENDECTOMY  1988  . BACK SURGERY    . CARDIOVERSION N/A 04/03/2019   Procedure: CARDIOVERSION;  Surgeon: Dorothy Spark, MD;  Location: [facility] ENDOSCOPY;  Service: Cardiovascular;  Laterality: N/A;  . CHOLECYSTECTOMY  2005  . ESOPHAGEAL MANOMETRY N/A 01/04/2016   Procedure: ESOPHAGEAL MANOMETRY (EM) 24 HR PH;  Surgeon: Manus Gunning, MD;  Location: [facility];  Service: Gastroenterology;  Laterality: N/A;  . ESOPHAGOGASTRODUODENOSCOPY (EGD) WITH PROPOFOL N/A 05/17/2016   Procedure: ESOPHAGOGASTRODUODENOSCOPY (EGD) WITH PROPOFOL;  Surgeon: Manus Gunning, MD;  Location: [facility]  ENDOSCOPY;  Service: Gastroenterology;  Laterality: N/A;  . NASAL SINUS SURGERY  2006  . SAVORY DILATION N/A 05/17/2016   Procedure: SAVORY DILATION;  Surgeon: Manus Gunning, MD;  Location: [facility] ENDOSCOPY;  Service: Gastroenterology;  Laterality: N/A;  . TEE WITHOUT CARDIOVERSION N/A 04/03/2019   Procedure: TRANSESOPHAGEAL ECHOCARDIOGRAM (TEE)-with DCCV;  Surgeon: Dorothy Spark, MD;  Location: [facility] ENDOSCOPY;; EF 55 to 60%.  No LVH.  Normal RV.  Moderate LA dilation.  No thrombus.  Mild RA dilation.  Mild to moderate TR.  Mild aortic valve sclerosis, no stenosis.  Moderately elevated PA pressures (47 mmHg)  . TONSILLECTOMY AND ADENOIDECTOMY    . TRANSTHORACIC ECHOCARDIOGRAM  2009   Normal LV size and function. EF 60-65%. No regional wall motion and amount is. Mild aortic sclerosis, no stenosis  . TUBAL LIGATION  1979   Past Medical History:  Diagnosis Date  . Allergy   . Anemia   . Arthritis   . B12 deficiency   . Complication of anesthesia    slow to wake up   . Depression   . Diabetes mellitus type 2, diet-controlled ([place]) 07/24/2018  . Fibromyalgia   . Generalized hyperhidrosis 04/17/2014  . GERD (gastroesophageal reflux disease)   . Hyperlipemia   . HYPOTHYROIDISM 05/29/2007  . Irritable bowel syndrome   . Migraines    hx of   . OSA (obstructive sleep apnea) 06/26/2019  . Oxygen deficiency    2 L at bedtime  . PVC (premature ventricular contraction)   . Senile calcific aortic valve sclerosis    Aortic Sclerosis-no stenosis  . Unspecified essential hypertension    denies htn on 05/09/16   BP (!) 147/73   Pulse 67   Temp 98.5 F (36.9 C)   Ht 5' 4.5" (1.638 m)   Wt 225 lb 6.4 oz (102.2 kg)   SpO2 94%   BMI 38.09 kg/m   Opioid Risk Score:   Fall Risk Score:  `1  Depression screen PHQ 2/9  Depression screen [facility] 2/9 02/12/2020 12/18/2019 06/19/2019 01/18/2019 08/08/2018 04/24/2018 01/10/2018  Decreased Interest 0 0 1 0 0 0 2  Down, Depressed, Hopeless 0 0 1 0 0  0 0  PHQ - 2 Score 0 0 2 0 0 0 2  Altered sleeping 0 0 - 0 - - 0  Tired, decreased energy 0 0 - 3 - - 1  Change in appetite 0 0 - 0 - - 0  Feeling bad or failure about yourself  0 0 - 0 - - 0  Trouble concentrating 0 0 - 0 - - 1  Moving slowly or fidgety/restless 0 0 - 0 - - 0  Suicidal thoughts 0 0 - 0 - - 0  PHQ-9 Score 0 0 - 3 - - 4  Difficult doing work/chores Not difficult at all - - Not difficult at all - - Not difficult at all  Some recent data might be hidden    Review of Systems  Constitutional: Negative.   HENT: Negative.   Eyes: Negative.   Respiratory: Negative.   Cardiovascular: Negative.   Gastrointestinal: Negative.   Endocrine: Negative.   Genitourinary: Negative.   Musculoskeletal: Positive for arthralgias and back pain.  Skin: Negative.   Allergic/Immunologic: Negative.   Neurological: Negative.        Tingling  Hematological: Negative.   Psychiatric/Behavioral: Negative.   All other systems reviewed and are negative.      Objective:   Physical Exam Vitals and nursing note reviewed.  Constitutional:      Appearance: Normal appearance.  Cardiovascular:     Rate and Rhythm: Normal rate and regular rhythm.     Pulses: Normal pulses.     Heart sounds: Normal heart sounds.  Pulmonary:     Effort: Pulmonary effort is normal.     Breath sounds: Normal breath sounds.  Musculoskeletal:     Cervical back: Normal range of motion and neck supple.     Right lower leg: Edema present.     Left lower leg: Edema present.     Comments: Normal Muscle Bulk and Muscle Testing Reveals:  Upper Extremities: Full ROM and Muscle Strength 5/5 Thoracic Paraspinal Tenderness: T-7-T-9  Lower Extremities: Full ROM and Muscle Strength 5/5 Left Lower Extremity Flexion Produces Pain into her Left Patella Arises from chair with ease Narrow  Based  Gait   Skin:    General: Skin is warm and dry.  Neurological:     Mental Status: She is alert and oriented to person, place, and  time.  Psychiatric:        Mood and Affect: Mood normal.        Behavior: Behavior normal.           Assessment & Plan:  1. Cervical postlaminectomy syndrome:/ Cervicalgia/ Cervical Radiculitis/with neck and shoulder pain/ Chronic Midline Thoracic Pain.Continue exercise routine and using heat therapy.03/18/2020 Continue :current medication regimen withMS Contin 15 mg one tablet three times a day #90.  We will continue the opioid monitoring program, this consists of regular clinic visits, examinations, urine drug screen, pill counts as well as use of [place] Controlled Substance Reporting system. A 12 month History has been reviewed on the [place] Controlled Substance Reporting System on 03/18/2020.   2. Fibromyalgia/ Neuropathy: Continuecurrent medication regimen withGabapentin, activity and exercise regime.03/18/2020. 3.BilateralKnee Pain/ Mild Degenerative Changes:Continuecurrent medication regimen withVoltaren Gel.03/18/2020. 4.BilateralAnkle Pain:No complaints today.Continue HEP as tolerated. Continueto monitor.03/18/2020. 5. Muscle Spasm: Continuecurrent medication regimen withRobaxin.Continue to Monitor.03/18/2020.. 6. Opioid Induced Constipation: No complaints Continue to Monitor.03/18/2020. 7. Midline Low Back Pain: Continue current medication regime, continue HEP as tolerated.03/18/2020. 8. Right Gluteal Medius Weakness and Atrophy: No complaints Today:Continue with Hip Abduction Exercisesas tolerated: Continue to Monitor.03/18/2020. 9. RightHip Pain/ Right Greater Trochanteric Tenderness:No complaints Today.Continue to Alternate Ice and Heat Therapy. Continue to Monitor.03/18/2020 10. Sacroiliac Joint Pain:No complaints today.S/PSacroiliac Injection on 08/24/2018 with good relief noted.03/18/2020 11.Lumbar Radiculitis:No complaints today.Continue Gabapentin.Continue HEP as tolerated. Continue current medication regimen.  Continue to monitor.03/18/2020.  F/U in 1 month

## 2020-04-10 DIAGNOSIS — G4733 Obstructive sleep apnea (adult) (pediatric): Secondary | ICD-10-CM | POA: Diagnosis not present

## 2020-04-10 DIAGNOSIS — J9610 Chronic respiratory failure, unspecified whether with hypoxia or hypercapnia: Secondary | ICD-10-CM | POA: Diagnosis not present

## 2020-04-16 ENCOUNTER — Other Ambulatory Visit: Payer: Self-pay

## 2020-04-16 ENCOUNTER — Encounter: Payer: Medicare Other | Admitting: Registered Nurse

## 2020-04-16 NOTE — Progress Notes (Unsigned)
Subjective:    Patient ID: [name], female    DOB: [birth year], 69 y.o.   MRN: [id number]  HPI Pain Inventory Average Pain 5 Pain Right Now 5 My pain is constant, burning and aching  In the last 24 hours, has pain interfered with the following? General activity 6 Relation with others 6 Enjoyment of life 6 What TIME of day is your pain at its worst? evening and night Sleep (in general) Good  Pain is worse with: walking, bending, standing and some activites Pain improves with: rest, heat/ice and medication Relief from Meds: 4  Family History  Problem Relation Age of Onset  . Breast cancer Mother   . Diabetes Mother   . Heart disease Mother   . Allergies Mother   . Diabetes Father   . Heart disease Father   . Emphysema Father   . Allergies Father   . Stomach cancer Maternal Uncle   . Breast cancer Paternal Aunt   . Colon cancer Neg Hx    Social History   Socioeconomic History  . Marital status: Married    Spouse name: Not on file  . Number of children: Not on file  . Years of education: Not on file  . Highest education level: Not on file  Occupational History  . Occupation: Radiation protection practitioner  . Occupation: retired  Tobacco Use  . Smoking status: Never Smoker  . Smokeless tobacco: Never Used  Vaping Use  . Vaping Use: Never used  Substance and Sexual Activity  . Alcohol use: Yes    Alcohol/week: 1.0 standard drink    Types: 1 Glasses of wine per week    Comment: rare  . Drug use: Never  . Sexual activity: Not Currently  Other Topics Concern  . Not on file  Social History Narrative  . Not on file   Social Determinants of Health   Financial Resource Strain: Low Risk   . Difficulty of Paying Living Expenses: Not hard at all  Food Insecurity: No Food Insecurity  . Worried About Charity fundraiser in the Last Year: Never true  . Ran Out of Food in the Last Year: Never true  Transportation Needs: No Transportation Needs  . Lack of Transportation  (Medical): No  . Lack of Transportation (Non-Medical): No  Physical Activity: Inactive  . Days of Exercise per Week: 0 days  . Minutes of Exercise per Session: 0 min  Stress: No Stress Concern Present  . Feeling of Stress : Not at all  Social Connections:   . Frequency of Communication with Friends and Family: Not on file  . Frequency of Social Gatherings with Friends and Family: Not on file  . Attends Religious Services: Not on file  . Active Member of Clubs or Organizations: Not on file  . Attends Archivist Meetings: Not on file  . Marital Status: Not on file   Past Surgical History:  Procedure Laterality Date  . [place] STUDY N/A 01/04/2016   Procedure: [place] STUDY;  Surgeon: Manus Gunning, MD;  Location: [facility] ENDOSCOPY;  Service: Gastroenterology;  Laterality: N/A;  . ABDOMINAL HYSTERECTOMY  1988  . ADENOIDECTOMY    . ANTERIOR CERVICAL DECOMP/DISCECTOMY FUSION  2009, 1993   first procedure in ~ 1993/Dr Botero  . APPENDECTOMY  1988  . BACK SURGERY    . CARDIOVERSION N/A 04/03/2019   Procedure: CARDIOVERSION;  Surgeon: Dorothy Spark, MD;  Location: [place];  Service: Cardiovascular;  Laterality: N/A;  .  CHOLECYSTECTOMY  2005  . ESOPHAGEAL MANOMETRY N/A 01/04/2016   Procedure: ESOPHAGEAL MANOMETRY (EM) 24 HR PH;  Surgeon: Manus Gunning, MD;  Location: [facility];  Service: Gastroenterology;  Laterality: N/A;  . ESOPHAGOGASTRODUODENOSCOPY (EGD) WITH PROPOFOL N/A 05/17/2016   Procedure: ESOPHAGOGASTRODUODENOSCOPY (EGD) WITH PROPOFOL;  Surgeon: Manus Gunning, MD;  Location: [facility];  Service: Gastroenterology;  Laterality: N/A;  . NASAL SINUS SURGERY  2006  . SAVORY DILATION N/A 05/17/2016   Procedure: SAVORY DILATION;  Surgeon: Manus Gunning, MD;  Location: [facility] END[facility];  Service: Gastroenterology;  Laterality: N/A;  . TEE WITHOUT CARDIOVERSION N/A 04/03/2019   Procedure: TRANSESOPHAGEAL ECHOCARDIOGRAM (TEE)-with  DCCV;  Surgeon: Dorothy Spark, MD;  Location: [facility] ENDOSCOPY;; EF 55 to 60%.  No LVH.  Normal RV.  Moderate LA dilation.  No thrombus.  Mild RA dilation.  Mild to moderate TR.  Mild aortic valve sclerosis, no stenosis.  Moderately elevated PA pressures (47 mmHg)  . TONSILLECTOMY AND ADENOIDECTOMY    . TRANSTHORACIC ECHOCARDIOGRAM  2009   Normal LV size and function. EF 60-65%. No regional wall motion and amount is. Mild aortic sclerosis, no stenosis  . TUBAL LIGATION  1979   Past Surgical History:  Procedure Laterality Date  . [place] STUDY N/A 01/04/2016   Procedure: [place] STUDY;  Surgeon: Manus Gunning, MD;  Location: [facility];  Service: Gastroenterology;  Laterality: N/A;  . ABDOMINAL HYSTERECTOMY  1988  . ADENOIDECTOMY    . ANTERIOR CERVICAL DECOMP/DISCECTOMY FUSION  2009, 1993   first procedure in ~ 1993/Dr Botero  . APPENDECTOMY  1988  . BACK SURGERY    . CARDIOVERSION N/A 04/03/2019   Procedure: CARDIOVERSION;  Surgeon: Dorothy Spark, MD;  Location: [facility] ENDOSCOPY;  Service: Cardiovascular;  Laterality: N/A;  . CHOLECYSTECTOMY  2005  . ESOPHAGEAL MANOMETRY N/A 01/04/2016   Procedure: ESOPHAGEAL MANOMETRY (EM) 24 HR PH;  Surgeon: Manus Gunning, MD;  Location: [facility] ENDOS[facility];  Service: Gastroenterology;  Laterality: N/A;  . ESOPHAGOGASTRODUODENOSCOPY (EGD) WITH PROPOFOL N/A 05/17/2016   Procedure: ESOPHAGOGASTRODUODENOSCOPY (EGD) WITH PROPOFOL;  Surgeon: Manus Gunning, MD;  Location: [facility] END[facility];  Service: Gastroenterology;  Laterality: N/A;  . NASAL SINUS SURGERY  2006  . SAVORY DILATION N/A 05/17/2016   Procedure: SAVORY DILATION;  Surgeon: Manus Gunning, MD;  Location: [facility] ENDOS[facility];  Service: Gastroenterology;  Laterality: N/A;  . TEE WITHOUT CARDIOVERSION N/A 04/03/2019   Procedure: TRANSESOPHAGEAL ECHOCARDIOGRAM (TEE)-with DCCV;  Surgeon: Dorothy Spark, MD;  Location: [facility] ENDOSCOPY;; EF 55 to 60%.  No LVH.  Normal RV.  Moderate  LA dilation.  No thrombus.  Mild RA dilation.  Mild to moderate TR.  Mild aortic valve sclerosis, no stenosis.  Moderately elevated PA pressures (47 mmHg)  . TONSILLECTOMY AND ADENOIDECTOMY    . TRANSTHORACIC ECHOCARDIOGRAM  2009   Normal LV size and function. EF 60-65%. No regional wall motion and amount is. Mild aortic sclerosis, no stenosis  . TUBAL LIGATION  1979   Past Medical History:  Diagnosis Date  . Allergy   . Anemia   . Arthritis   . B12 deficiency   . Complication of anesthesia    slow to wake up   . Depression   . Diabetes mellitus type 2, diet-controlled ([place]) 07/24/2018  . Fibromyalgia   . Generalized hyperhidrosis 04/17/2014  . GERD (gastroesophageal reflux disease)   . Hyperlipemia   . HYPOTHYROIDISM 05/29/2007  . Irritable bowel syndrome   . Migraines  hx of   . OSA (obstructive sleep apnea) 06/26/2019  . Oxygen deficiency    2 L at bedtime  . PVC (premature ventricular contraction)   . Senile calcific aortic valve sclerosis    Aortic Sclerosis-no stenosis  . Unspecified essential hypertension    denies htn on 05/09/16   Temp 99.2 F (37.3 C)   Opioid Risk Score:   Fall Risk Score:  `1  Depression screen PHQ 2/9  Depression screen [facility] 2/9 02/12/2020 12/18/2019 06/19/2019 01/18/2019 08/08/2018 04/24/2018 01/10/2018  Decreased Interest 0 0 1 0 0 0 2  Down, Depressed, Hopeless 0 0 1 0 0 0 0  PHQ - 2 Score 0 0 2 0 0 0 2  Altered sleeping 0 0 - 0 - - 0  Tired, decreased energy 0 0 - 3 - - 1  Change in appetite 0 0 - 0 - - 0  Feeling bad or failure about yourself  0 0 - 0 - - 0  Trouble concentrating 0 0 - 0 - - 1  Moving slowly or fidgety/restless 0 0 - 0 - - 0  Suicidal thoughts 0 0 - 0 - - 0  PHQ-9 Score 0 0 - 3 - - 4  Difficult doing work/chores Not difficult at all - - Not difficult at all - - Not difficult at all  Some recent data might be hidden     Review of Systems  Musculoskeletal: Positive for back pain.       Knee pain         Objective:   Physical Exam        Assessment & Plan:

## 2020-04-17 ENCOUNTER — Encounter: Payer: Medicare Other | Attending: Physical Medicine & Rehabilitation | Admitting: Registered Nurse

## 2020-04-17 ENCOUNTER — Encounter: Payer: Self-pay | Admitting: Registered Nurse

## 2020-04-17 VITALS — BP 122/63 | HR 73 | Temp 97.6°F | Ht 64.5 in | Wt 225.0 lb

## 2020-04-17 DIAGNOSIS — G894 Chronic pain syndrome: Secondary | ICD-10-CM | POA: Insufficient documentation

## 2020-04-17 DIAGNOSIS — Z79891 Long term (current) use of opiate analgesic: Secondary | ICD-10-CM | POA: Insufficient documentation

## 2020-04-17 DIAGNOSIS — M546 Pain in thoracic spine: Secondary | ICD-10-CM

## 2020-04-17 DIAGNOSIS — Z5181 Encounter for therapeutic drug level monitoring: Secondary | ICD-10-CM | POA: Insufficient documentation

## 2020-04-17 DIAGNOSIS — M961 Postlaminectomy syndrome, not elsewhere classified: Secondary | ICD-10-CM

## 2020-04-17 DIAGNOSIS — G8929 Other chronic pain: Secondary | ICD-10-CM | POA: Insufficient documentation

## 2020-04-17 DIAGNOSIS — M170 Bilateral primary osteoarthritis of knee: Secondary | ICD-10-CM | POA: Insufficient documentation

## 2020-04-17 MED ORDER — MORPHINE SULFATE ER 15 MG PO TBCR
15.0000 mg | EXTENDED_RELEASE_TABLET | Freq: Three times a day (TID) | ORAL | 0 refills | Status: DC
Start: 2020-04-17 — End: 2020-05-14

## 2020-04-17 MED ORDER — METHOCARBAMOL 500 MG PO TABS: 500.0000 mg | ORAL_TABLET | Freq: Three times a day (TID) | ORAL | 3 refills | Status: DC | PRN

## 2020-04-17 NOTE — Progress Notes (Signed)
Subjective:    Patient ID: [name], female    DOB: 06-03-1951, 69 y.o.   MRN: [id number]  HPI: [name] is a 69 y.o. female whose appointment was changed to a My-Chart Video visit, she arrived to office on 04/16/2020 febrile, her appointment was re-scheduled to today 04/17/2020. [name] agrees with My-Chart visit and verbalizes understanding. She states her pain is located in  Her mid- back and bilateral knee pain. She rates her pain 5. Her current exercise regime is walking and performing stretching exercises.  [name] Morphine equivalent is 45.00  MME.    Last UDS was Performed on 10/09/2019, it was consistent.  Pain Inventory Average Pain 5 Pain Right Now 5 My pain is constant, burning and aching  In the last 24 hours, has pain interfered with the following? General activity 6 Relation with others 6 Enjoyment of life 6 What TIME of day is your pain at its worst? evening and night Sleep (in general) Good  Pain is worse with: walking, bending, standing and some activites Pain improves with: rest, heat/ice and medication Relief from Meds: 4  Family History  Problem Relation Age of Onset  . Breast cancer Mother   . Diabetes Mother   . Heart disease Mother   . Allergies Mother   . Diabetes Father   . Heart disease Father   . Emphysema Father   . Allergies Father   . Stomach cancer Maternal Uncle   . Breast cancer Paternal Aunt   . Colon cancer Neg Hx    Social History   Socioeconomic History  . Marital status: Married    Spouse name: Not on file  . Number of children: Not on file  . Years of education: Not on file  . Highest education level: Not on file  Occupational History  . Occupation: Radiation protection practitioner  . Occupation: retired  Tobacco Use  . Smoking status: Never Smoker  . Smokeless tobacco: Never Used  Vaping Use  . Vaping Use: Never used  Substance and Sexual Activity  . Alcohol use: Yes    Alcohol/week: 1.0 standard drink    Types: 1 Glasses  of wine per week    Comment: rare  . Drug use: Never  . Sexual activity: Not Currently  Other Topics Concern  . Not on file  Social History Narrative  . Not on file   Social Determinants of Health   Financial Resource Strain: Low Risk   . Difficulty of Paying Living Expenses: Not hard at all  Food Insecurity: No Food Insecurity  . Worried About Charity fundraiser in the Last Year: Never true  . Ran Out of Food in the Last Year: Never true  Transportation Needs: No Transportation Needs  . Lack of Transportation (Medical): No  . Lack of Transportation (Non-Medical): No  Physical Activity: Inactive  . Days of Exercise per Week: 0 days  . Minutes of Exercise per Session: 0 min  Stress: No Stress Concern Present  . Feeling of Stress : Not at all  Social Connections:   . Frequency of Communication with Friends and Family: Not on file  . Frequency of Social Gatherings with Friends and Family: Not on file  . Attends Religious Services: Not on file  . Active Member of Clubs or Organizations: Not on file  . Attends Archivist Meetings: Not on file  . Marital Status: Not on file   Past Surgical History:  Procedure Laterality Date  . 24  HOUR [place] STUDY N/A 01/04/2016   Procedure: 24 HOUR PH STUDY;  Surgeon: Manus Gunning, MD;  Location: [facility] ENDOSCOPY;  Service: Gastroenterology;  Laterality: N/A;  . ABDOMINAL HYSTERECTOMY  1988  . ADENOIDECTOMY    . ANTERIOR CERVICAL DECOMP/DISCECTOMY FUSION  2009, 1993   first procedure in ~ 1993/Dr Botero  . APPENDECTOMY  1988  . BACK SURGERY    . CARDIOVERSION N/A 04/03/2019   Procedure: CARDIOVERSION;  Surgeon: Dorothy Spark, MD;  Location: [facility] ENDOSCOPY;  Service: Cardiovascular;  Laterality: N/A;  . CHOLECYSTECTOMY  2005  . ESOPHAGEAL MANOMETRY N/A 01/04/2016   Procedure: ESOPHAGEAL MANOMETRY (EM) 24 HR PH;  Surgeon: Manus Gunning, MD;  Location: [facility] ENDOSCOPY;  Service: Gastroenterology;  Laterality: N/A;  .  ESOPHAGOGASTRODUODENOSCOPY (EGD) WITH PROPOFOL N/A 05/17/2016   Procedure: ESOPHAGOGASTRODUODENOSCOPY (EGD) WITH PROPOFOL;  Surgeon: Manus Gunning, MD;  Location: [facility] ENDOSCOPY;  Service: Gastroenterology;  Laterality: N/A;  . NASAL SINUS SURGERY  2006  . SAVORY DILATION N/A 05/17/2016   Procedure: SAVORY DILATION;  Surgeon: Manus Gunning, MD;  Location: [facility];  Service: Gastroenterology;  Laterality: N/A;  . TEE WITHOUT CARDIOVERSION N/A 04/03/2019   Procedure: TRANSESOPHAGEAL ECHOCARDIOGRAM (TEE)-with DCCV;  Surgeon: Dorothy Spark, MD;  Location: [facility] ENDOSCOPY;; EF 55 to 60%.  No LVH.  Normal RV.  Moderate LA dilation.  No thrombus.  Mild RA dilation.  Mild to moderate TR.  Mild aortic valve sclerosis, no stenosis.  Moderately elevated PA pressures (47 mmHg)  . TONSILLECTOMY AND ADENOIDECTOMY    . TRANSTHORACIC ECHOCARDIOGRAM  2009   Normal LV size and function. EF 60-65%. No regional wall motion and amount is. Mild aortic sclerosis, no stenosis  . TUBAL LIGATION  1979   Past Surgical History:  Procedure Laterality Date  . [place] STUDY N/A 01/04/2016   Procedure: [place] STUDY;  Surgeon: Manus Gunning, MD;  Location: [facility] ENDOSCOPY;  Service: Gastroenterology;  Laterality: N/A;  . ABDOMINAL HYSTERECTOMY  1988  . ADENOIDECTOMY    . ANTERIOR CERVICAL DECOMP/DISCECTOMY FUSION  2009, 1993   first procedure in ~ 1993/Dr Botero  . APPENDECTOMY  1988  . BACK SURGERY    . CARDIOVERSION N/A 04/03/2019   Procedure: CARDIOVERSION;  Surgeon: Dorothy Spark, MD;  Location[facility] ENDOSCOPY;  Service: Cardiovascular;  Laterality: N/A;  . CHOLECYSTECTOMY  2005  . ESOPHAGEAL MANOMETRY N/A 01/04/2016   Procedure: ESOPHAGEAL MANOMETRY (EM) 24 HR PH;  Surgeon: Manus Gunning, MD;  Location: [facility] ENDOSCOPY;  Service: Gastroenterology;  Laterality: N/A;  . ESOPHAGOGASTRODUODENOSCOPY (EGD) WITH PROPOFOL N/A 05/17/2016   Procedure: ESOPHAGOGASTRODUODENOSCOPY (EGD)  WITH PROPOFOL;  Surgeon: Manus Gunning, MD;  Location: [facility] ENDOSCOPY;  Service: Gastroenterology;  Laterality: N/A;  . NASAL SINUS SURGERY  2006  . SAVORY DILATION N/A 05/17/2016   Procedure: SAVORY DILATION;  Surgeon: Manus Gunning, MD;  Location: [facility] ENDOSCOPY;  Service: Gastroenterology;  Laterality: N/A;  . TEE WITHOUT CARDIOVERSION N/A 04/03/2019   Procedure: TRANSESOPHAGEAL ECHOCARDIOGRAM (TEE)-with DCCV;  Surgeon: Dorothy Spark, MD;  Location: [facility] ENDOSCOPY;; EF 55 to 60%.  No LVH.  Normal RV.  Moderate LA dilation.  No thrombus.  Mild RA dilation.  Mild to moderate TR.  Mild aortic valve sclerosis, no stenosis.  Moderately elevated PA pressures (47 mmHg)  . TONSILLECTOMY AND ADENOIDECTOMY    . TRANSTHORACIC ECHOCARDIOGRAM  2009   Normal LV size and function. EF 60-65%. No regional wall motion and amount is. Mild aortic sclerosis,  no stenosis  . TUBAL LIGATION  1979   Past Medical History:  Diagnosis Date  . Allergy   . Anemia   . Arthritis   . B12 deficiency   . Complication of anesthesia    slow to wake up   . Depression   . Diabetes mellitus type 2, diet-controlled ([place]) 07/24/2018  . Fibromyalgia   . Generalized hyperhidrosis 04/17/2014  . GERD (gastroesophageal reflux disease)   . Hyperlipemia   . HYPOTHYROIDISM 05/29/2007  . Irritable bowel syndrome   . Migraines    hx of   . OSA (obstructive sleep apnea) 06/26/2019  . Oxygen deficiency    2 L at bedtime  . PVC (premature ventricular contraction)   . Senile calcific aortic valve sclerosis    Aortic Sclerosis-no stenosis  . Unspecified essential hypertension    denies htn on 05/09/16   BP 122/63 Comment: pt reported, virtual visit  Pulse 73 Comment: pt reported, virtual visit  Temp 97.6 F (36.4 C) Comment: pt reported, virtual visit  Ht 5' 4.5" (1.638 m) Comment: pt reported, virtual visit  Wt 225 lb (102.1 kg) Comment: pt reported, virtual visit  SpO2 97% Comment: pt reported, virtual visit   BMI 38.02 kg/m   Opioid Risk Score:   Fall Risk Score:  `1  Depression screen PHQ 2/9  Depression screen [facility] 2/9 02/12/2020 12/18/2019 06/19/2019 01/18/2019 08/08/2018 04/24/2018 01/10/2018  Decreased Interest 0 0 1 0 0 0 2  Down, Depressed, Hopeless 0 0 1 0 0 0 0  PHQ - 2 Score 0 0 2 0 0 0 2  Altered sleeping 0 0 - 0 - - 0  Tired, decreased energy 0 0 - 3 - - 1  Change in appetite 0 0 - 0 - - 0  Feeling bad or failure about yourself  0 0 - 0 - - 0  Trouble concentrating 0 0 - 0 - - 1  Moving slowly or fidgety/restless 0 0 - 0 - - 0  Suicidal thoughts 0 0 - 0 - - 0  PHQ-9 Score 0 0 - 3 - - 4  Difficult doing work/chores Not difficult at all - - Not difficult at all - - Not difficult at all  Some recent data might be hidden     Review of Systems  Musculoskeletal: Positive for back pain.       Knee pain  All other systems reviewed and are negative.      Objective:   Physical Exam Vitals and nursing note reviewed.  Musculoskeletal:     Comments: No Physical Exam Performed: My-Chart Visit           Assessment & Plan:  1. Cervical postlaminectomy syndrome:/ Cervicalgia/ Cervical Radiculitis/with neck and shoulder pain/ Chronic Midline Thoracic Pain.Continue exercise routine and using heat therapy.04/17/2020 Continue:current medication regimen withMS Contin 15 mg one tablet three times a day #90.  We will continue the opioid monitoring program, this consists of regular clinic visits, examinations, urine drug screen, pill counts as well as use of [place] Controlled Substance Reporting system. A 12 month History has been reviewed on the [place] Controlled Substance Reporting Systemon 04/17/2020. 2. Fibromyalgia/ Neuropathy: Continuecurrent medication regimen withGabapentin, activity and exercise regime.04/17/2020. 3.BilateralKnee Pain/ Mild Degenerative Changes:Continuecurrent medication regimen withVoltaren Gel.04/17/2020. 4.BilateralAnkle  Pain:No complaints today.Continue HEP as tolerated. Continueto monitor.04/17/2020. 5. Muscle Spasm: Continuecurrent medication regimen withRobaxin.Continue to Monitor.04/17/2020.. 6. Opioid Induced Constipation: No complaints Continue to Monitor.04/17/2020. 7. Midline Low Back Pain: Continue current medication regime, continue  HEP as tolerated.04/17/2020. 8. Right Gluteal Medius Weakness and Atrophy: No complaints Today:Continue with Hip Abduction Exercisesas tolerated: Continue to Monitor.04/17/2020. 9. RightHip Pain/ Right Greater Trochanteric Tenderness:No complaints Today.Continue to Alternate Ice and Heat Therapy. Continue to Monitor.04/17/2020 10. Sacroiliac Joint Pain:No complaints today.S/PSacroiliac Injection on 08/24/2018 with good relief noted.04/17/2020 11.Lumbar Radiculitis:No complaints today.Continue Gabapentin.Continue HEP as tolerated. Continue current medication regimen. Continue to monitor.04/17/2020.  F/U in 1 month  My-Chart Video Visit Established Patient Location of Patient in her Home  Location of Provider : In the Office

## 2020-04-20 ENCOUNTER — Encounter: Payer: Medicare Other | Admitting: Registered Nurse

## 2020-04-22 ENCOUNTER — Ambulatory Visit (HOSPITAL_COMMUNITY)
Admission: RE | Admit: 2020-04-22 | Discharge: 2020-04-22 | Disposition: A | Discharge status: Home / Self Care | Payer: Medicare Other | Source: Ambulatory Visit | Attending: Physician Assistant | Admitting: Physician Assistant

## 2020-04-22 ENCOUNTER — Other Ambulatory Visit: Payer: Self-pay

## 2020-04-22 VITALS — BP 130/60 | HR 65 | Ht 64.5 in | Wt 223.6 lb

## 2020-04-22 DIAGNOSIS — I10 Essential (primary) hypertension: Secondary | ICD-10-CM | POA: Insufficient documentation

## 2020-04-22 DIAGNOSIS — E119 Type 2 diabetes mellitus without complications: Secondary | ICD-10-CM | POA: Diagnosis not present

## 2020-04-22 DIAGNOSIS — Z6837 Body mass index (BMI) 37.0-37.9, adult: Secondary | ICD-10-CM | POA: Diagnosis not present

## 2020-04-22 DIAGNOSIS — G4733 Obstructive sleep apnea (adult) (pediatric): Secondary | ICD-10-CM | POA: Insufficient documentation

## 2020-04-22 DIAGNOSIS — D6869 Other thrombophilia: Secondary | ICD-10-CM | POA: Insufficient documentation

## 2020-04-22 DIAGNOSIS — I480 Paroxysmal atrial fibrillation: Secondary | ICD-10-CM | POA: Insufficient documentation

## 2020-04-22 DIAGNOSIS — E669 Obesity, unspecified: Secondary | ICD-10-CM | POA: Insufficient documentation

## 2020-04-22 DIAGNOSIS — Z9981 Dependence on supplemental oxygen: Secondary | ICD-10-CM | POA: Insufficient documentation

## 2020-04-22 DIAGNOSIS — Z79899 Other long term (current) drug therapy: Secondary | ICD-10-CM | POA: Diagnosis not present

## 2020-04-22 DIAGNOSIS — Z8249 Family history of ischemic heart disease and other diseases of the circulatory system: Secondary | ICD-10-CM | POA: Insufficient documentation

## 2020-04-22 DIAGNOSIS — Z7901 Long term (current) use of anticoagulants: Secondary | ICD-10-CM | POA: Insufficient documentation

## 2020-04-22 NOTE — Progress Notes (Signed)
Primary Care Physician: Owens Loffler, MD Primary Cardiologist: Dr Ellyn Hack Primary Electrophysiologist: none Referring Physician: Dr Bary Leriche is a 69 y.o. female with a history of fibromyalgia, DM, atrial flutter, HTN, OSA, paroxysmal atrial fibrillation who presents for follow up in the [facility]. The patient was initially diagnosed with atrial fibrillation 04/01/19 after presenting with one week of fatigue and weakness. She underwent TEE/DCCV on 04/03/19. Patient is on Eliquis for a CHADS2VASC score of 4. She reports that she has done well since then until the night of 08/28/19 when she noted heart racing symptoms with some lightheaded. When she got up the next morning and check her home ECG device she was in afib with RVR. There were no specific triggers that the patient could identify however, she did have a COVID vaccine two weeks prior. She is on BiPap for OSA. She denies any significant alcohol use. Patient presented for DCCV on 09/09/19 in [place] and the procedure was cancelled. She reports that she feels much better in SR with more energy, less SOB, no heart racing or dizziness.   On follow up today, patient reports she has done very well since her last visit. She denies any heart racing or palpitations. She denies any bleeding issues on anticoagulation.   Today, she denies symptoms of palpitations, chest pain, shortness of breath, orthopnea, PND, lower extremity edema, presyncope, syncope, bleeding, or neurologic sequela. The patient is tolerating medications without difficulties and is otherwise without complaint today.    Atrial Fibrillation Risk Factors:  she does have symptoms or diagnosis of sleep apnea. she is compliant with BiPAP therapy. she does not have a history of rheumatic fever. she does not have a history of alcohol use. The patient does not have a history of early familial atrial fibrillation or other arrhythmias.  she has a  BMI of Body mass index is 37.79 kg/m.Marland Kitchen Filed Weights   04/22/20 1349  Weight: 101.4 kg    Family History  Problem Relation Age of Onset  . Breast cancer Mother   . Diabetes Mother   . Heart disease Mother   . Allergies Mother   . Diabetes Father   . Heart disease Father   . Emphysema Father   . Allergies Father   . Stomach cancer Maternal Uncle   . Breast cancer Paternal Aunt   . Colon cancer Neg Hx      Atrial Fibrillation Management history:  Previous antiarrhythmic drugs: flecainide Previous cardioversions: 04/03/19 Previous ablations: none CHADS2VASC score: 4 Anticoagulation history: Eliquis   Past Medical History:  Diagnosis Date  . Allergy   . Anemia   . Arthritis   . B12 deficiency   . Complication of anesthesia    slow to wake up   . Depression   . Diabetes mellitus type 2, diet-controlled ([place]) 07/24/2018  . Fibromyalgia   . Generalized hyperhidrosis 04/17/2014  . GERD (gastroesophageal reflux disease)   . Hyperlipemia   . HYPOTHYROIDISM 05/29/2007  . Irritable bowel syndrome   . Migraines    hx of   . OSA (obstructive sleep apnea) 06/26/2019  . Oxygen deficiency    2 L at bedtime  . PVC (premature ventricular contraction)   . Senile calcific aortic valve sclerosis    Aortic Sclerosis-no stenosis  . Unspecified essential hypertension    denies htn on 05/09/16   Past Surgical History:  Procedure Laterality Date  . [place] STUDY N/A 01/04/2016   Procedure:  [place] STUDY;  Surgeon: Manus Gunning, MD;  Location: [facility] ENDOSCOPY;  Service: Gastroenterology;  Laterality: N/A;  . ABDOMINAL HYSTERECTOMY  1988  . ADENOIDECTOMY    . ANTERIOR CERVICAL DECOMP/DISCECTOMY FUSION  2009, 1993   first procedure in ~ 1993/Dr Botero  . APPENDECTOMY  1988  . BACK SURGERY    . CARDIOVERSION N/A 04/03/2019   Procedure: CARDIOVERSION;  Surgeon: Dorothy Spark, MD;  Location: [facility] ENDOSCOPY;  Service: Cardiovascular;  Laterality: N/A;  .  CHOLECYSTECTOMY  2005  . ESOPHAGEAL MANOMETRY N/A 01/04/2016   Procedure: ESOPHAGEAL MANOMETRY (EM) 24 HR PH;  Surgeon: Manus Gunning, MD;  Location: [facility] ENDOSCOPY;  Service: Gastroenterology;  Laterality: N/A;  . ESOPHAGOGASTRODUODENOSCOPY (EGD) WITH PROPOFOL N/A 05/17/2016   Procedure: ESOPHAGOGASTRODUODENOSCOPY (EGD) WITH PROPOFOL;  Surgeon: Manus Gunning, MD;  Location: [facility] ENDOSCOPY;  Service: Gastroenterology;  Laterality: N/A;  . NASAL SINUS SURGERY  2006  . SAVORY DILATION N/A 05/17/2016   Procedure: SAVORY DILATION;  Surgeon: Manus Gunning, MD;  Location: [facility];  Service: Gastroenterology;  Laterality: N/A;  . TEE WITHOUT CARDIOVERSION N/A 04/03/2019   Procedure: TRANSESOPHAGEAL ECHOCARDIOGRAM (TEE)-with DCCV;  Surgeon: Dorothy Spark, MD;  Location: [facility] ENDOSCOPY;; EF 55 to 60%.  No LVH.  Normal RV.  Moderate LA dilation.  No thrombus.  Mild RA dilation.  Mild to moderate TR.  Mild aortic valve sclerosis, no stenosis.  Moderately elevated PA pressures (47 mmHg)  . TONSILLECTOMY AND ADENOIDECTOMY    . TRANSTHORACIC ECHOCARDIOGRAM  2009   Normal LV size and function. EF 60-65%. No regional wall motion and amount is. Mild aortic sclerosis, no stenosis  . TUBAL LIGATION  1979    Current Outpatient Medications  Medication Sig Dispense Refill  . acetaminophen (TYLENOL) 650 MG CR tablet Take 650 mg by mouth in the morning and at bedtime.     . B Complex Vitamins (B COMPLEX 100 PO) Take 1 tablet by mouth every morning.     . bisacodyl (DULCOLAX) 5 MG EC tablet Take 10 mg by mouth at bedtime.     . calcium carbonate (TUMS EX) 750 MG chewable tablet Chew 1-2 tablets by mouth as needed for heartburn (acid reflux).     . Calcium Carbonate-Vitamin D (CALCIUM 600+D PO) Take 1 tablet by mouth in the morning and at bedtime.     . carboxymethylcellulose (REFRESH PLUS) 0.5 % SOLN Place 1 drop into both eyes 3 (three) times daily as needed (dry eyes).     . cetirizine  (ZYRTEC) 10 MG tablet Take 10 mg by mouth at bedtime.     . Cholecalciferol (VITAMIN D) 125 MCG (5000 UT) CAPS Take 5,000 Units by mouth every morning.     . clindamycin (CLEOCIN T) 1 % lotion Apply 1 application topically daily as needed (eczema).     . Coenzyme Q-10 100 MG capsule Take 100 mg by mouth every morning.     . diclofenac sodium (VOLTAREN) 1 % GEL Apply 4 g topically 4 (four) times daily as needed (joint pain).     Marland Kitchen diltiazem (CARDIZEM CD) 240 MG 24 hr capsule TAKE 1 CAPSULE BY MOUTH  DAILY 90 capsule 1  . docusate sodium (COLACE) 250 MG capsule Take 250 mg by mouth at bedtime.    Marland Kitchen ELIQUIS 5 MG TABS tablet TAKE 1 TABLET BY MOUTH  TWICE DAILY 180 tablet 3  . flecainide (TAMBOCOR) 50 MG tablet TAKE 1 TABLET BY MOUTH  TWICE DAILY 180 tablet  1  . fluocinonide (LIDEX) 0.05 % external solution Apply 1 application topically daily as needed (eczema on scalp).     . gabapentin (NEURONTIN) 300 MG capsule TAKE 1 CAPSULE BY MOUTH 3  TIMES DAILY 270 capsule 3  . GRAPE SEED EXTRACT PO Take 100 mg by mouth 2 (two) times daily.     Marland Kitchen levothyroxine (SYNTHROID) 50 MCG tablet TAKE 1 TABLET BY MOUTH  DAILY 90 tablet 3  . loratadine (CLARITIN) 10 MG tablet Take 10 mg by mouth every morning.     . Magnesium Bisglycinate (MAG GLYCINATE PO) Take 400 mg by mouth at bedtime.     . methocarbamol (ROBAXIN) 500 MG tablet Take 1 tablet (500 mg total) by mouth every 8 (eight) hours as needed for muscle spasms. 270 tablet 3  . mirtazapine (REMERON) 15 MG tablet TAKE 1 TABLET BY MOUTH AT  BEDTIME 90 tablet 1  . morphine (MS CONTIN) 15 MG 12 hr tablet Take 1 tablet (15 mg total) by mouth 3 (three) times daily. 90 tablet 0  . nitroGLYCERIN (NITROSTAT) 0.4 MG SL tablet Place 1 tablet (0.4 mg total) under the tongue every 5 (five) minutes as needed for chest pain. 90 tablet 3  . oxybutynin (DITROPAN-XL) 5 MG 24 hr tablet Take 1 tablet (5 mg total) by mouth at bedtime. 90 tablet 1  . oxymetazoline (AFRIN) 0.05 % nasal  spray Place 1 spray into both nostrils 2 (two) times daily as needed for congestion.    . RABEprazole (ACIPHEX) 20 MG tablet TAKE 1 TABLET BY MOUTH EVERY DAY 90 tablet 3  . tretinoin (RETIN-A) 0.05 % cream Apply 1 application topically daily as needed (acne).     . vitamin B-12 (CYANOCOBALAMIN) 1000 MCG tablet Take 1,000 mcg by mouth daily.     No current facility-administered medications for this encounter.    Allergies  Allergen Reactions  . Cyclobenzaprine Hcl Anaphylaxis and Swelling    Reaction - throat closed, tongue swelling  . Rizatriptan Benzoate Anaphylaxis    Throat and Tongue Swelling Closed  . Sumatriptan Swelling    Throat closed & Tongue Swelling  . Tizanidine Shortness Of Breath  . Aleve [Naproxen Sodium] Hives  . Clarithromycin Hives  . Doxycycline Diarrhea and Nausea And Vomiting  . Duloxetine Nausea Only  . Egg [Eggs Or Egg-Derived Products] Diarrhea and Nausea And Vomiting  . Keflex [Cephalexin] Itching and Other (See Comments)    Sores  . Metronidazole Diarrhea and Nausea Only  . Oxycodone Hcl Diarrhea and Nausea And Vomiting  . Oxycontin [Oxycodone Hcl] Nausea And Vomiting  . Penicillins Other (See Comments)    Unknown reaction Did it involve swelling of the face/tongue/throat, SOB, or low BP? Unknown Did it involve sudden or severe rash/hives, skin peeling, or any reaction on the inside of your mouth or nose? Unknown Did you need to seek medical attention at a hospital or doctor's office? Unknown When did it last happen?infant If all above answers are "NO", may proceed with cephalosporin use.  . Rosuvastatin Other (See Comments)    Muscle aches  . Venlafaxine Hives  . Voltaren [Diclofenac Sodium] Hives    Reaction to oral med, uses gel occasionally  . Septra [Sulfamethoxazole-Trimethoprim] Itching and Rash  . Tramadol Diarrhea, Nausea And Vomiting and Palpitations    Social History   Socioeconomic History  . Marital status: Married    Spouse  name: Not on file  . Number of children: Not on file  . Years of education: Not  on file  . Highest education level: Not on file  Occupational History  . Occupation: Radiation protection practitioner  . Occupation: retired  Tobacco Use  . Smoking status: Never Smoker  . Smokeless tobacco: Never Used  Vaping Use  . Vaping Use: Never used  Substance and Sexual Activity  . Alcohol use: Yes    Alcohol/week: 1.0 standard drink    Types: 1 Glasses of wine per week    Comment: rare  . Drug use: Never  . Sexual activity: Not Currently  Other Topics Concern  . Not on file  Social History Narrative  . Not on file   Social Determinants of Health   Financial Resource Strain: Low Risk   . Difficulty of Paying Living Expenses: Not hard at all  Food Insecurity: No Food Insecurity  . Worried About Charity fundraiser in the Last Year: Never true  . Ran Out of Food in the Last Year: Never true  Transportation Needs: No Transportation Needs  . Lack of Transportation (Medical): No  . Lack of Transportation (Non-Medical): No  Physical Activity: Inactive  . Days of Exercise per Week: 0 days  . Minutes of Exercise per Session: 0 min  Stress: No Stress Concern Present  . Feeling of Stress : Not at all  Social Connections:   . Frequency of Communication with Friends and Family: Not on file  . Frequency of Social Gatherings with Friends and Family: Not on file  . Attends Religious Services: Not on file  . Active Member of Clubs or Organizations: Not on file  . Attends Archivist Meetings: Not on file  . Marital Status: Not on file  Intimate Partner Violence: Not At Risk  . Fear of Current or Ex-Partner: No  . Emotionally Abused: No  . Physically Abused: No  . Sexually Abused: No     ROS- All systems are reviewed and negative except as per the HPI above.  Physical Exam: Vitals:   04/22/20 1349  BP: 130/60  Pulse: 65  Weight: 101.4 kg  Height: 5' 4.5" (1.638 m)    GEN- The patient is well  appearing obese female, alert and oriented x 3 today.   HEENT-head normocephalic, atraumatic, sclera clear, conjunctiva pink, hearing intact, trachea midline. Lungs- Clear to ausculation bilaterally, normal work of breathing Heart- Regular rate and rhythm, no murmurs, rubs or gallops  GI- soft, NT, ND, + BS Extremities- no clubbing, cyanosis, or edema MS- no significant deformity or atrophy Skin- no rash or lesion Psych- euthymic mood, full affect Neuro- strength and sensation are intact   Wt Readings from Last 3 Encounters:  04/22/20 101.4 kg  04/17/20 102.1 kg  03/18/20 102.2 kg    EKG today demonstrates SR HR 65, PR 166, QRS 88, QTc 411  TEE 04/03/19 demonstrated  1. Left ventricular ejection fraction, by visual estimation, is 55 to  60%. The left ventricle has normal function. Normal left ventricular size.  There is no left ventricular hypertrophy.  2. Global right ventricle has normal systolic function.The right  ventricular size is normal. No increase in right ventricular wall  thickness.  3. Left atrial size was moderately dilated.  4. No thrombus is seen in the left atrium or left atrial appendage.  5. Right atrial size was mildly dilated.  6. The mitral valve is normal in structure. Mild mitral valve  regurgitation. No evidence of mitral stenosis.  7. The tricuspid valve is normal in structure. Tricuspid valve  regurgitation mild-moderate.  8. The  aortic valve is normal in structure. Aortic valve regurgitation is  mild by color flow Doppler. Mild aortic valve sclerosis without stenosis.  9. The pulmonic valve was normal in structure. Pulmonic valve  regurgitation is not visualized by color flow Doppler.  10. Moderately elevated pulmonary artery systolic pressure.  11. The tricuspid regurgitant velocity is 3.12 m/s, and with an assumed  right atrial pressure of 8 mmHg, the estimated right ventricular systolic  pressure is moderately elevated at 46.9 mmHg.   12. The inferior vena cava is normal in size with greater than 50%  respiratory variability, suggesting right atrial pressure of 3 mmHg.  13. No thrombus is seen in the left atrium or left atrial appendage.  14. This TEE was followed by a successful cardioversion.   Epic records are reviewed at length today  CHA2DS2-VASc Score = 4 The patient's score is based upon: CHF History: No HTN History: Yes Age : 57-75 Diabetes History: Yes Stroke History: No Vascular Disease History: No Gender: Female   ASSESSMENT AND PLAN: 1. Paroxysmal Atrial Fibrillation/typical atrial flutter The patient's CHA2DS2-VASc score is 4, indicating a 4.8% annual risk of stroke.   Patient appears to be maintaining SR. Continue flecainide 50 mg BID Continue diltiazem 240 mg daily  Continue Eliquis 5 mg BID  2. Secondary Hypercoagulable State (ICD10:  D68.69) The patient is at significant risk for stroke/thromboembolism based upon her CHA2DS2-VASc Score of 4.  Continue Apixaban (Eliquis).   3. Obesity Body mass index is 37.79 kg/m. Lifestyle modification was discussed and encouraged including regular physical activity and weight reduction.  4. OSA Patient reports compliance with BiPap therapy.  5. HTN Stable, no changes today.   Follow up with Dr Ellyn Hack as scheduled. AF clinic in 6 months.    [facility] [address] [place], [place] [address] [phone] 04/22/2020 2:37 PM

## 2020-04-30 ENCOUNTER — Other Ambulatory Visit: Payer: Self-pay | Admitting: Family Medicine

## 2020-05-06 ENCOUNTER — Other Ambulatory Visit: Payer: Self-pay | Admitting: Family Medicine

## 2020-05-07 ENCOUNTER — Other Ambulatory Visit: Payer: Self-pay | Admitting: Physical Medicine & Rehabilitation

## 2020-05-11 DIAGNOSIS — G4733 Obstructive sleep apnea (adult) (pediatric): Secondary | ICD-10-CM | POA: Diagnosis not present

## 2020-05-11 DIAGNOSIS — J9610 Chronic respiratory failure, unspecified whether with hypoxia or hypercapnia: Secondary | ICD-10-CM | POA: Diagnosis not present

## 2020-05-13 ENCOUNTER — Telehealth: Payer: Medicare Other

## 2020-05-14 ENCOUNTER — Encounter: Payer: Self-pay | Admitting: Registered Nurse

## 2020-05-14 ENCOUNTER — Other Ambulatory Visit: Payer: Self-pay

## 2020-05-14 ENCOUNTER — Encounter: Payer: Medicare Other | Attending: Physical Medicine & Rehabilitation | Admitting: Registered Nurse

## 2020-05-14 VITALS — BP 124/72 | HR 64 | Temp 98.8°F | Ht 64.5 in | Wt 226.0 lb

## 2020-05-14 DIAGNOSIS — M170 Bilateral primary osteoarthritis of knee: Secondary | ICD-10-CM | POA: Diagnosis not present

## 2020-05-14 DIAGNOSIS — G894 Chronic pain syndrome: Secondary | ICD-10-CM | POA: Diagnosis not present

## 2020-05-14 DIAGNOSIS — G8929 Other chronic pain: Secondary | ICD-10-CM | POA: Diagnosis not present

## 2020-05-14 DIAGNOSIS — Z5181 Encounter for therapeutic drug level monitoring: Secondary | ICD-10-CM | POA: Insufficient documentation

## 2020-05-14 DIAGNOSIS — Z79891 Long term (current) use of opiate analgesic: Secondary | ICD-10-CM | POA: Insufficient documentation

## 2020-05-14 DIAGNOSIS — M961 Postlaminectomy syndrome, not elsewhere classified: Secondary | ICD-10-CM | POA: Diagnosis not present

## 2020-05-14 DIAGNOSIS — M546 Pain in thoracic spine: Secondary | ICD-10-CM | POA: Insufficient documentation

## 2020-05-14 MED ORDER — MORPHINE SULFATE ER 15 MG PO TBCR: 15.0000 mg | EXTENDED_RELEASE_TABLET | Freq: Three times a day (TID) | ORAL | 0 refills | Status: DC

## 2020-05-14 NOTE — Progress Notes (Signed)
Subjective:    Patient ID: [name], female    DOB: Dec 14, 1950, 69 y.o.   MRN: [id number]  HPI: [name] is a 69 y.o. female who returns for follow up appointment for chronic pain and medication refill. She states her pain is located in her mid- back. She rates her pain 4. Her current exercise regime is walking and performing stretching exercises.  Ms. Panameno Morphine equivalent is 45.00  MME.    Last UDS was Performed on 10/09/2019, it was consistent.    Pain Inventory Average Pain 5 Pain Right Now 4 My pain is constant, burning and aching  In the last 24 hours, has pain interfered with the following? General activity 5 Relation with others 5 Enjoyment of life 5 What TIME of day is your pain at its worst? evening and night Sleep (in general) Good  Pain is worse with: walking, bending, standing and some activites Pain improves with: rest, heat/ice and medication Relief from Meds: 4  Family History  Problem Relation Age of Onset  . Breast cancer Mother   . Diabetes Mother   . Heart disease Mother   . Allergies Mother   . Diabetes Father   . Heart disease Father   . Emphysema Father   . Allergies Father   . Stomach cancer Maternal Uncle   . Breast cancer Paternal Aunt   . Colon cancer Neg Hx    Social History   Socioeconomic History  . Marital status: Married    Spouse name: Not on file  . Number of children: Not on file  . Years of education: Not on file  . Highest education level: Not on file  Occupational History  . Occupation: Radiation protection practitioner  . Occupation: retired  Tobacco Use  . Smoking status: Never Smoker  . Smokeless tobacco: Never Used  Vaping Use  . Vaping Use: Never used  Substance and Sexual Activity  . Alcohol use: Yes    Alcohol/week: 1.0 standard drink    Types: 1 Glasses of wine per week    Comment: rare  . Drug use: Never  . Sexual activity: Not Currently  Other Topics Concern  . Not on file  Social History Narrative  . Not on  file   Social Determinants of Health   Financial Resource Strain: Low Risk   . Difficulty of Paying Living Expenses: Not hard at all  Food Insecurity: No Food Insecurity  . Worried About Charity fundraiser in the Last Year: Never true  . Ran Out of Food in the Last Year: Never true  Transportation Needs: No Transportation Needs  . Lack of Transportation (Medical): No  . Lack of Transportation (Non-Medical): No  Physical Activity: Inactive  . Days of Exercise per Week: 0 days  . Minutes of Exercise per Session: 0 min  Stress: No Stress Concern Present  . Feeling of Stress : Not at all  Social Connections:   . Frequency of Communication with Friends and Family: Not on file  . Frequency of Social Gatherings with Friends and Family: Not on file  . Attends Religious Services: Not on file  . Active Member of Clubs or Organizations: Not on file  . Attends Archivist Meetings: Not on file  . Marital Status: Not on file   Past Surgical History:  Procedure Laterality Date  . [place] [place] STUDY N/A 01/04/2016   Procedure: [place] STUDY;  Surgeon: Manus Gunning, MD;  Location: [facility] ENDOSCOPY;  Service: Gastroenterology;  Laterality: N/A;  . ABDOMINAL HYSTERECTOMY  1988  . ADENOIDECTOMY    . ANTERIOR CERVICAL DECOMP/DISCECTOMY FUSION  2009, 1993   first procedure in ~ 1993/Dr Botero  . APPENDECTOMY  1988  . BACK SURGERY    . CARDIOVERSION N/A 04/03/2019   Procedure: CARDIOVERSION;  Surgeon: Dorothy Spark, MD;  Location: [facility] ENDOSCOPY;  Service: Cardiovascular;  Laterality: N/A;  . CHOLECYSTECTOMY  2005  . ESOPHAGEAL MANOMETRY N/A 01/04/2016   Procedure: ESOPHAGEAL MANOMETRY (EM) 24 HR PH;  Surgeon: Manus Gunning, MD;  Location: [facility] ENDOSCOPY;  Service: Gastroenterology;  Laterality: N/A;  . ESOPHAGOGASTRODUODENOSCOPY (EGD) WITH PROPOFOL N/A 05/17/2016   Procedure: ESOPHAGOGASTRODUODENOSCOPY (EGD) WITH PROPOFOL;  Surgeon: Manus Gunning, MD;  Location:  [facility] ENDOSCOPY;  Service: Gastroenterology;  Laterality: N/A;  . NASAL SINUS SURGERY  2006  . SAVORY DILATION N/A 05/17/2016   Procedure: SAVORY DILATION;  Surgeon: Manus Gunning, MD;  Location: [facility];  Service: Gastroenterology;  Laterality: N/A;  . TEE WITHOUT CARDIOVERSION N/A 04/03/2019   Procedure: TRANSESOPHAGEAL ECHOCARDIOGRAM (TEE)-with DCCV;  Surgeon: Dorothy Spark, MD;  Location: [facility] ENDOSCOPY;; EF 55 to 60%.  No LVH.  Normal RV.  Moderate LA dilation.  No thrombus.  Mild RA dilation.  Mild to moderate TR.  Mild aortic valve sclerosis, no stenosis.  Moderately elevated PA pressures (47 mmHg)  . TONSILLECTOMY AND ADENOIDECTOMY    . TRANSTHORACIC ECHOCARDIOGRAM  2009   Normal LV size and function. EF 60-65%. No regional wall motion and amount is. Mild aortic sclerosis, no stenosis  . TUBAL LIGATION  1979   Past Surgical History:  Procedure Laterality Date  . [place] STUDY N/A 01/04/2016   Procedure: [place] STUDY;  Surgeon: Manus Gunning, MD;  Location: [facility] ENDOSCOPY;  Service: Gastroenterology;  Laterality: N/A;  . ABDOMINAL HYSTERECTOMY  1988  . ADENOIDECTOMY    . ANTERIOR CERVICAL DECOMP/DISCECTOMY FUSION  2009, 1993   first procedure in ~ 1993/Dr Botero  . APPENDECTOMY  1988  . BACK SURGERY    . CARDIOVERSION N/A 04/03/2019   Procedure: CARDIOVERSION;  Surgeon: Dorothy Spark, MD;  Location: [facility] ENDOSCOPY;  Service: Cardiovascular;  Laterality: N/A;  . CHOLECYSTECTOMY  2005  . ESOPHAGEAL MANOMETRY N/A 01/04/2016   Procedure: ESOPHAGEAL MANOMETRY (EM) 24 HR PH;  Surgeon: Manus Gunning, MD;  Location: [facility] ENDOSCOPY;  Service: Gastroenterology;  Laterality: N/A;  . ESOPHAGOGASTRODUODENOSCOPY (EGD) WITH PROPOFOL N/A 05/17/2016   Procedure: ESOPHAGOGASTRODUODENOSCOPY (EGD) WITH PROPOFOL;  Surgeon: Manus Gunning, MD;  Location: [facility] ENDOSCOPY;  Service: Gastroenterology;  Laterality: N/A;  . NASAL SINUS SURGERY  2006  . SAVORY DILATION  N/A 05/17/2016   Procedure: SAVORY DILATION;  Surgeon: Manus Gunning, MD;  Location: [facility] ENDOS[facility];  Service: Gastroenterology;  Laterality: N/A;  . TEE WITHOUT CARDIOVERSION N/A 04/03/2019   Procedure: TRANSESOPHAGEAL ECHOCARDIOGRAM (TEE)-with DCCV;  Surgeon: Dorothy Spark, MD;  Location: [facility] ENDOSCOPY;; EF 55 to 60%.  No LVH.  Normal RV.  Moderate LA dilation.  No thrombus.  Mild RA dilation.  Mild to moderate TR.  Mild aortic valve sclerosis, no stenosis.  Moderately elevated PA pressures (47 mmHg)  . TONSILLECTOMY AND ADENOIDECTOMY    . TRANSTHORACIC ECHOCARDIOGRAM  2009   Normal LV size and function. EF 60-65%. No regional wall motion and amount is. Mild aortic sclerosis, no stenosis  . TUBAL LIGATION  1979   Past Medical History:  Diagnosis Date  . Allergy   . Anemia   .  Arthritis   . B12 deficiency   . Complication of anesthesia    slow to wake up   . Depression   . Diabetes mellitus type 2, diet-controlled ([place]) 07/24/2018  . Fibromyalgia   . Generalized hyperhidrosis 04/17/2014  . GERD (gastroesophageal reflux disease)   . Hyperlipemia   . HYPOTHYROIDISM 05/29/2007  . Irritable bowel syndrome   . Migraines    hx of   . OSA (obstructive sleep apnea) 06/26/2019  . Oxygen deficiency    2 L at bedtime  . PVC (premature ventricular contraction)   . Senile calcific aortic valve sclerosis    Aortic Sclerosis-no stenosis  . Unspecified essential hypertension    denies htn on 05/09/16   BP 124/72   Pulse 64   Temp 98.8 F (37.1 C)   Ht 5' 4.5" (1.638 m)   Wt 226 lb (102.5 kg)   SpO2 98%   BMI 38.19 kg/m   Opioid Risk Score:   Fall Risk Score:  `1  Depression screen PHQ 2/9  Depression screen [facility] 2/9 02/12/2020 12/18/2019 06/19/2019 01/18/2019 08/08/2018 04/24/2018 01/10/2018  Decreased Interest 0 0 1 0 0 0 2  Down, Depressed, Hopeless 0 0 1 0 0 0 0  PHQ - 2 Score 0 0 2 0 0 0 2  Altered sleeping 0 0 - 0 - - 0  Tired, decreased energy 0 0 - 3 - - 1  Change  in appetite 0 0 - 0 - - 0  Feeling bad or failure about yourself  0 0 - 0 - - 0  Trouble concentrating 0 0 - 0 - - 1  Moving slowly or fidgety/restless 0 0 - 0 - - 0  Suicidal thoughts 0 0 - 0 - - 0  PHQ-9 Score 0 0 - 3 - - 4  Difficult doing work/chores Not difficult at all - - Not difficult at all - - Not difficult at all  Some recent data might be hidden    Review of Systems  Constitutional: Negative.   HENT: Negative.   Eyes: Negative.   Respiratory: Negative.   Cardiovascular: Negative.   Gastrointestinal: Negative.   Endocrine: Negative.   Genitourinary: Negative.   Musculoskeletal: Positive for arthralgias and back pain.  Skin: Negative.   Allergic/Immunologic: Negative.   Neurological: Negative.   Hematological: Negative.   Psychiatric/Behavioral: Negative.   All other systems reviewed and are negative.      Objective:   Physical Exam Vitals and nursing note reviewed.  Constitutional:      Appearance: Normal appearance.  Cardiovascular:     Rate and Rhythm: Normal rate and regular rhythm.     Pulses: Normal pulses.     Heart sounds: Normal heart sounds.  Pulmonary:     Effort: Pulmonary effort is normal.     Breath sounds: Normal breath sounds.  Musculoskeletal:     Cervical back: Normal range of motion and neck supple.     Comments: Normal Muscle Bulk and Muscle Testing Reveals:  Upper Extremities: Full ROM and Muscle Strength 5/5 Thoracic Paraspinal Tenderness: T-7-T-9 Lower Extremities: Full ROM and Muscle Strength 5/5 Arises from Chair with ease Narrow Based Gait   Skin:    General: Skin is warm and dry.  Neurological:     Mental Status: She is alert and oriented to person, place, and time.  Psychiatric:        Mood and Affect: Mood normal.        Behavior: Behavior normal.  Assessment & Plan:  1. Cervical postlaminectomy syndrome:/ Cervicalgia/ Cervical Radiculitis/with neck and shoulder pain/ Chronic Midline Thoracic  Pain.Continue exercise routine and using heat therapy.05/14/2020 Continue:current medication regimen. Refilled: MS Contin 15 mg one tablet three times a day #90.  We will continue the opioid monitoring program, this consists of regular clinic visits, examinations, urine drug screen, pill counts as well as use of [place] Controlled Substance Reporting system. A 12 month History has been reviewed on the [place] Controlled Substance Reporting Systemon 05/14/2020. 2. Fibromyalgia/ Neuropathy: Continuecurrent medication regimen withGabapentin, activity and exercise regime.05/14/2020. 3.BilateralKnee Pain/ Mild Degenerative Changes:Continuecurrent medication regimen withVoltaren Gel.05/14/2020. 4.BilateralAnkle Pain:No complaints today.Continue HEP as tolerated. Continueto monitor.05/14/2020. 5. Muscle Spasm: Continuecurrent medication regimen withRobaxin.Continue to Monitor.05/14/2020.. 6. Opioid Induced Constipation: No complaints Continue to Monitor.05/14/2020. 7. Midline Low Back Pain: Continue current medication regime, continue HEP as tolerated.05/14/2020. 8. Right Gluteal Medius Weakness and Atrophy: No complaints Today:Continue with Hip Abduction Exercisesas tolerated: Continue to Monitor.05/14/2020. 9. RightHip Pain/ Right Greater Trochanteric Tenderness:No complaints Today.Continue to Alternate Ice and Heat Therapy. Continue to Monitor.05/14/2020 10. Sacroiliac Joint Pain:No complaints today.S/PSacroiliac Injection on 08/24/2018 with good relief noted.05/14/2020 11.Lumbar Radiculitis:No complaints today.Continue Gabapentin.Continue HEP as tolerated. Continue current medication regimen. Continue to monitor.05/14/2020.  F/U in 1 month

## 2020-05-16 ENCOUNTER — Other Ambulatory Visit: Payer: Self-pay | Admitting: Family Medicine

## 2020-06-02 ENCOUNTER — Other Ambulatory Visit: Payer: Self-pay

## 2020-06-02 ENCOUNTER — Encounter: Payer: Self-pay | Admitting: Cardiology

## 2020-06-02 ENCOUNTER — Ambulatory Visit: Payer: Medicare Other | Admitting: Cardiology

## 2020-06-02 VITALS — BP 132/78 | HR 71 | Ht 65.0 in | Wt 227.0 lb

## 2020-06-02 DIAGNOSIS — I480 Paroxysmal atrial fibrillation: Secondary | ICD-10-CM

## 2020-06-02 DIAGNOSIS — E785 Hyperlipidemia, unspecified: Secondary | ICD-10-CM | POA: Diagnosis not present

## 2020-06-02 DIAGNOSIS — I10 Essential (primary) hypertension: Secondary | ICD-10-CM

## 2020-06-02 DIAGNOSIS — E669 Obesity, unspecified: Secondary | ICD-10-CM

## 2020-06-02 DIAGNOSIS — E782 Mixed hyperlipidemia: Secondary | ICD-10-CM

## 2020-06-02 IMAGING — CR DG HIP (WITH OR WITHOUT PELVIS) 2-3V*R*
2 series · 2 of 2 positions shown · non-contrast
Comparison: 04/06/2018

CLINICAL DATA: Right posterolateral hip pain after fall 10/01/2019.

EXAM:
DG HIP (WITH OR WITHOUT PELVIS) 2-3V RIGHT

[w hip ap right]
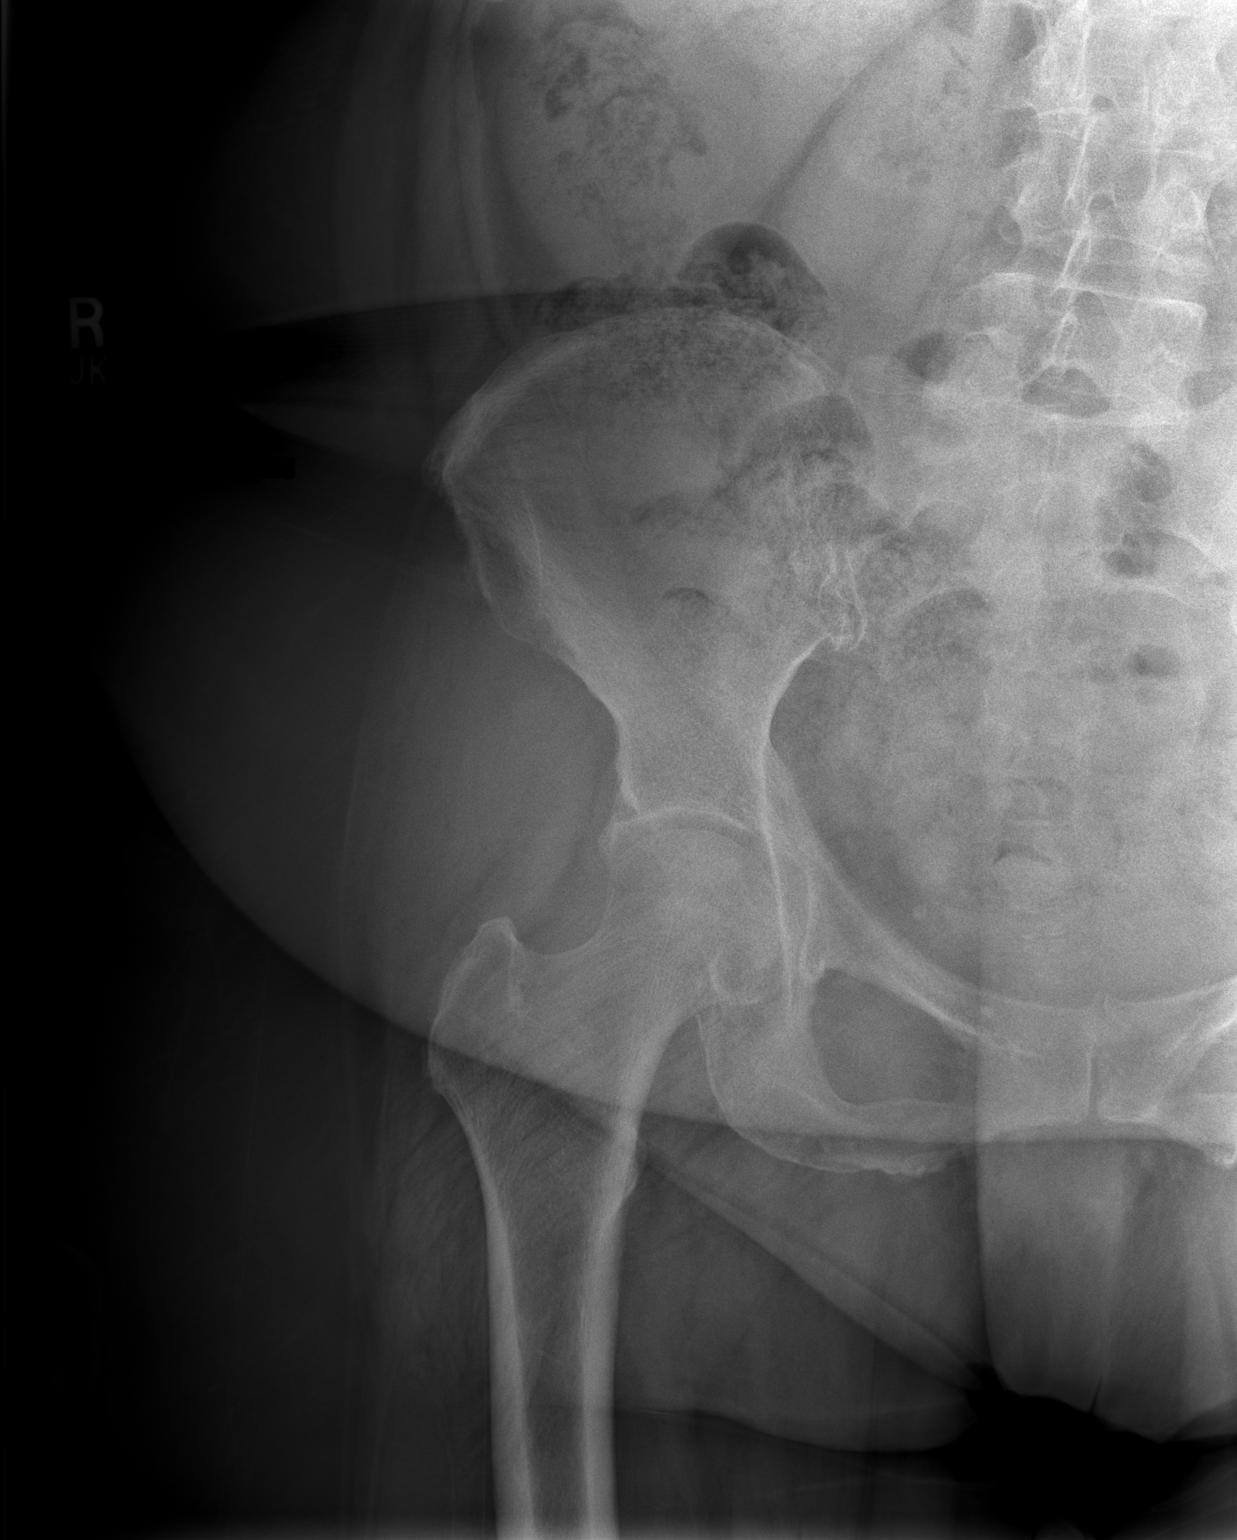

[w hip frog right]
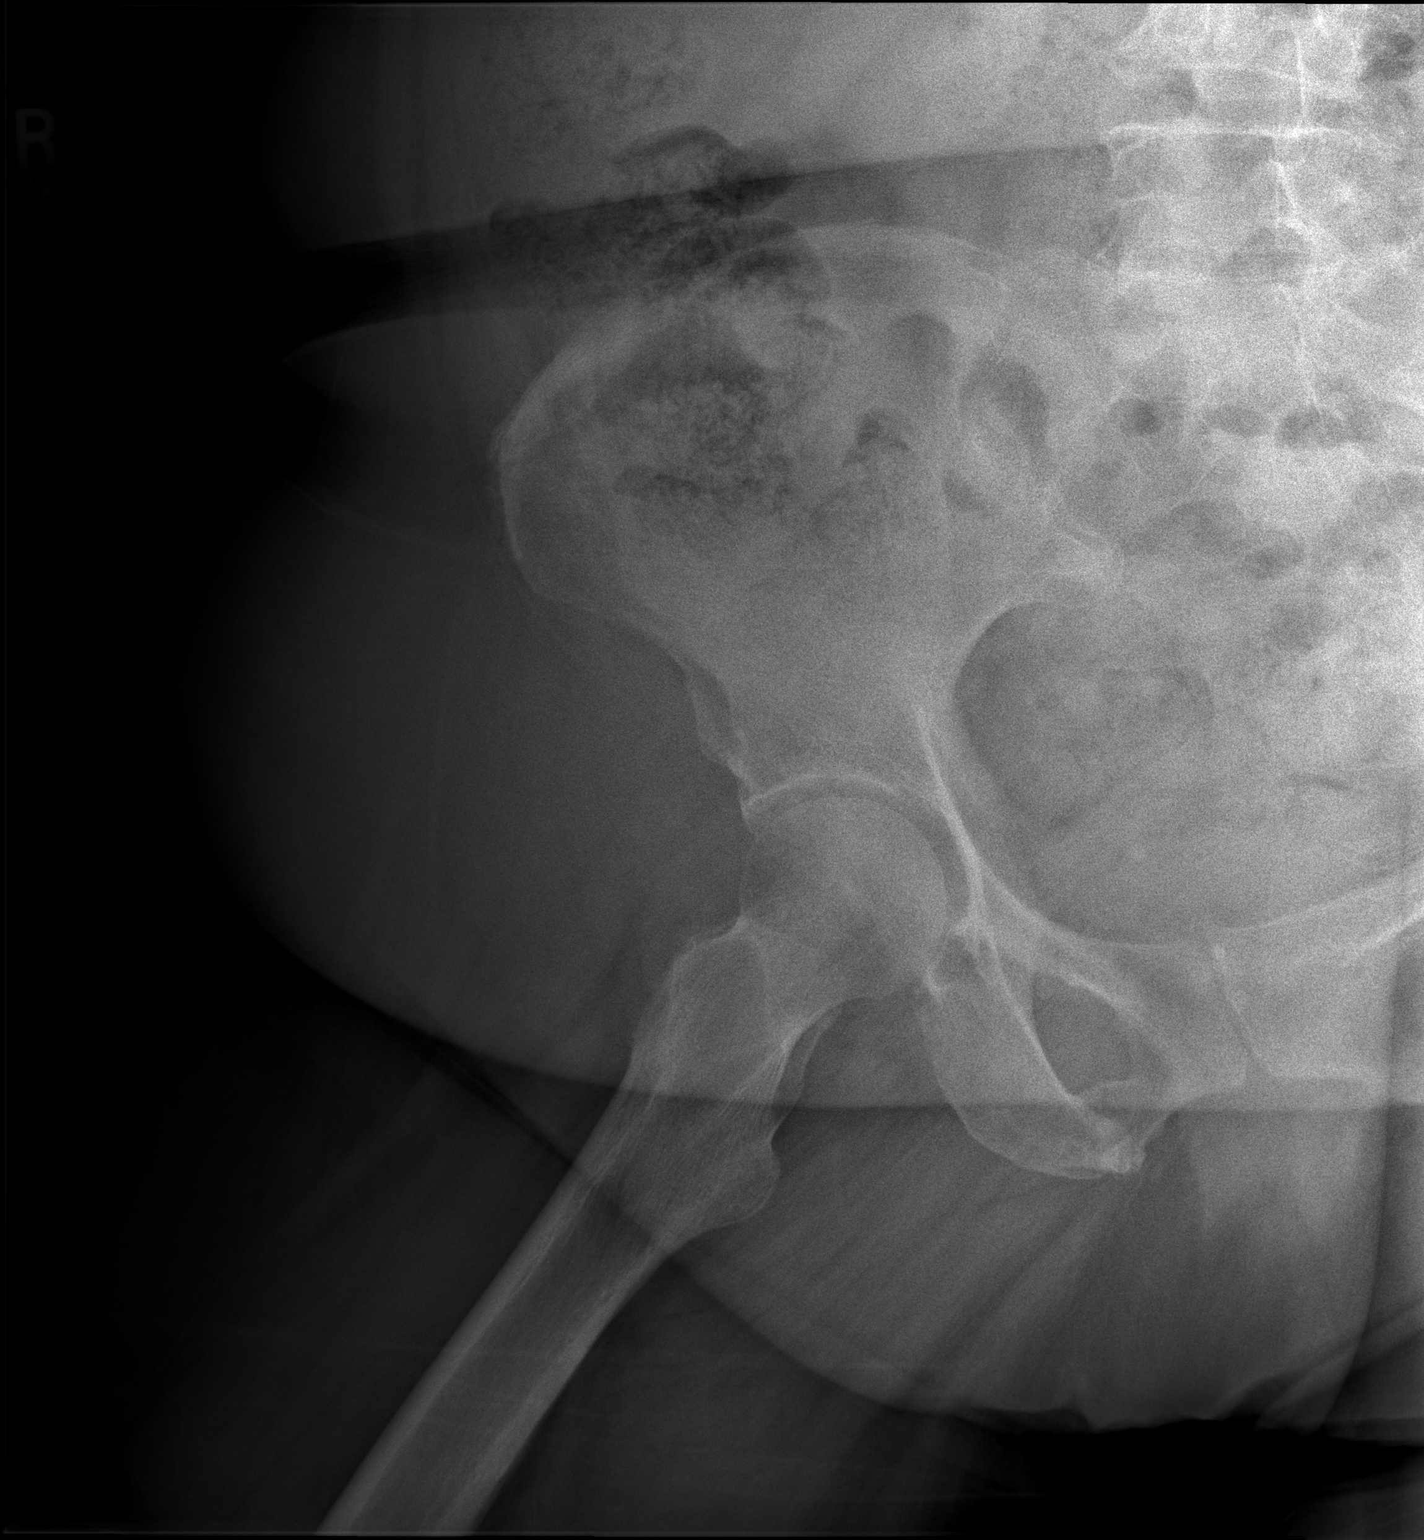

[2 of 2 positions shown; findings below may reference images not displayed]

FINDINGS: Minimal degenerate change of the right hip. No evidence of acute
fracture or dislocation. Remainder of the exam is unchanged.
IMPRESSION: No acute findings.

## 2020-06-02 MED ORDER — FLECAINIDE ACETATE 50 MG PO TABS
50.0000 mg | ORAL_TABLET | Freq: Two times a day (BID) | ORAL | 1 refills | Status: DC
Start: 2020-06-02 — End: 2020-07-10

## 2020-06-02 NOTE — Patient Instructions (Signed)
Medication Instructions:    if you have  Break through - afib Take flecainide  double up to 100 mg twice a day for 2 days if break through Afib ss still present contact office   *If you need a refill on your cardiac medications before your next appointment, please call your pharmacy*   Lab Work: Not needed .   Testing/Procedures: Not needed   Follow-Up: At [facility], you and your health needs are our priority.  As part of our continuing mission to provide you with exceptional heart care, we have created designated Provider Care Teams.  These Care Teams include your primary Cardiologist (physician) and Advanced Practice Providers (APPs -  Physician Assistants and Nurse Practitioners) who all work together to provide you with the care you need, when you need it.     Your next appointment:   12 month(s)  The format for your next appointment:   In Person  Provider:   Glenetta Hew, MD   Other Instructions

## 2020-06-02 NOTE — Progress Notes (Signed)
Primary Care Provider: Owens Loffler, MD Cardiologist: Glenetta Hew, MD Electrophysiologist: None  Clinic Note: Chief Complaint  Patient presents with  . Follow-up    Doing well  . Atrial Fibrillation    ? Last episode was ~3-4 weeks ago during a bout with GI bug   Problem List Items Addressed This Visit    Paroxysmal atrial fibrillation ([place]): CHA2D2DSVasc =4; Eliquis - Primary (Chronic)    Doing relatively well - only 1 breakthrough in last ~6 months.  Plan:  Continue Diltiazem 240 mg along with Flecainide 50 mg BID  Continue Eliquis  For Breakthrough Afib:  Take flecainide  double up to 100 mg twice a day for 2 days if break through Afib is still present contact office      Relevant Medications   flecainide (TAMBOCOR) 50 MG tablet   Other Relevant Orders   EKG 12-Lead (Completed)   Hyperlipidemia with target LDL less than 100 (Chronic)    Based upon Coronary Calcium Score - recommend LDL < 100.  Most recent level - 108 which is down from 2020 -->  She would like to avoid statin - we discussed dietary adjustments.  If not @ goal by next year - would consider low dose rosuvastatin ~10 mg daily.      Relevant Medications   flecainide (TAMBOCOR) 50 MG tablet   Essential hypertension (Chronic)    BP controlled on Diltiazem CD 240 mg.      Relevant Medications   flecainide (TAMBOCOR) 50 MG tablet   Obesity (BMI 30-39.9) (Chronic)    The patient understands the need to lose weight with diet and exercise. We have discussed specific strategies for this. Lowering weight will potentially help HTN & Afib as well as lipids.        HPI:    [name] is a 70 y.o. female with a PMH notable for Fibromyalgia / Chronic Pain, Nocturnal Hypoxia, OSA on CPAP, Diet Controlled DM-2 along with PAF who presents today for what amounts to be a ~6 week f/u - but an annual f/u for me.   Dx of AFib (Oct 5-8, 2020 - 9 d Hx of palpitations -> Afib 150s -> ER ?  Aflutter/Fib -- IV Dilt  TEE/DCCV 10/7; d/c on Eliquis & Cardizem CD 240 mg.   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 Cor Ca Score 63.    Was started on Flecainide by Afib Clinic --> CHA2DS2Vasc = 4  GXT on Flecainide 09/25/2019: 3:37 min, 5.3 METS. HR 88% MPHR  [name] was last seen by me on May 10 2019 as a f/u with new dx of Afib. -> ended up referred to Afib Clinic after episode of Afib 08/28/19:  08/29/19: Started on Flecainide 50 mg BID along with Dilt CD 240 mg + 120mg  PM dose until DCCV. , DCCV cancelled  3/22: tolerating Flecainde. Feels much better in NSR. Energy better & less SOB. PM dose Dilt d/c'd.   -> GXT on Flecainide checked. -- was in NSR on 3/15  6/23: stable, no change  10/27: no changes  Recent Hospitalizations: none  Reviewed  CV studies:    The following studies were reviewed today: (if available, images/films reviewed: From Epic Chart or Care Everywhere) . None since GXT noted above:   Interval History:   [name] today overall doing pretty well.  She is due to see Dr. Halford Chessman for OSA evaluation on 15th. The last time she [place] was in A. fib  was back on November 14 when she started feeling sick on the 13th.  She was vomiting GI bug.  She was not sure if the vomiting caused on A. fib or the other way around.  As her GI symptoms resolved, she went out of A. fib according to her smart watch.  Pretty much since that episode she has been doing okay from a cardiac standpoint.  She is under quite a bit of stress with her husband's health, and her foot surgery issues.  However she has not had any further breakthrough episodes of A. fib that she can tell.  She seems to doing relatively well on the combination of diltiazem and digoxin is not having bleeding issues on Eliquis.  She did have some aortic valvular disease on echocardiogram, does not notice any exertional dyspnea or chest pain.  CV Review of Symptoms (Summary): no chest pain or dyspnea on  exertion positive for - Rare episodes of A. fib, most recently was about a month ago.  Lasted about a day.  Spontaneously resolved. negative for - chest pain, edema, orthopnea, paroxysmal nocturnal dyspnea, shortness of breath or Syncope/near syncope or TIA/amaurosis fugax, claudication.  Her husband was recently diagnosed with stage IV metastatic colon cancer end of July..  The patient does not have symptoms concerning for COVID-19 infection (fever, chills, cough, or new shortness of breath).   REVIEWED OF SYSTEMS   Review of Systems  Constitutional: Negative for malaise/fatigue and weight loss (Hoping to lose weight, but not doing well.).  HENT: Negative for nosebleeds.   Respiratory: Negative for shortness of breath.   Cardiovascular: Positive for leg swelling (Mild off-and-on swelling).  Gastrointestinal: Negative for blood in stool and melena.  Genitourinary: Negative for hematuria.  Musculoskeletal: Positive for joint pain. Negative for back pain and falls.       L foot surgery - since then difficult to walk / exercise.(SUTURE DEHISCENCE - Infected - never healed correclty)   Neurological: Positive for headaches. Negative for dizziness.  Endo/Heme/Allergies: Positive for environmental allergies.  Psychiatric/Behavioral: Negative for memory loss. The patient is not nervous/anxious and does not have insomnia.     I have reviewed and (if needed) personally updated the patient's problem list, medications, allergies, past medical and surgical history, social and family history.   PAST MEDICAL HISTORY   Past Medical History:  Diagnosis Date  . Allergy   . Anemia   . Arthritis   . B12 deficiency   . Complication of anesthesia    slow to wake up   . Depression   . Diabetes mellitus type 2, diet-controlled ([place]) 07/24/2018  . Fibromyalgia   . Generalized hyperhidrosis 04/17/2014  . GERD (gastroesophageal reflux disease)   . History of rheumatic fever as a child   . Hyperlipemia    . HYPOTHYROIDISM 05/29/2007  . Irritable bowel syndrome   . Migraines    hx of   . OSA (obstructive sleep apnea) 06/26/2019  . Oxygen deficiency    2 L at bedtime  . PVC (premature ventricular contraction)   . Senile calcific aortic valve sclerosis    Aortic Sclerosis-no stenosis  . Unspecified essential hypertension    denies htn on 05/09/16    PAST SURGICAL HISTORY   Past Surgical History:  Procedure Laterality Date  . [place] STUDY N/A 01/04/2016   Procedure: [place] STUDY;  Surgeon: Manus Gunning, MD;  Location: [facility] ENDOSCOPY;  Service: Gastroenterology;  Laterality: N/A;  . ABDOMINAL HYSTERECTOMY  1988  .  ADENOIDECTOMY    . ANTERIOR CERVICAL DECOMP/DISCECTOMY FUSION  2009, 1993   first procedure in ~ 1993/Dr Botero  . APPENDECTOMY  1988  . BACK SURGERY    . CARDIOVERSION N/A 04/03/2019   Procedure: CARDIOVERSION;  Surgeon: Dorothy Spark, MD;  Location: [facility] ENDOSCOPY;  Service: Cardiovascular;  Laterality: N/A;  . CHOLECYSTECTOMY  2005  . ESOPHAGEAL MANOMETRY N/A 01/04/2016   Procedure: ESOPHAGEAL MANOMETRY (EM) 24 HR PH;  Surgeon: Manus Gunning, MD;  Location: [facility] ENDOSCOPY;  Service: Gastroenterology;  Laterality: N/A;  . ESOPHAGOGASTRODUODENOSCOPY (EGD) WITH PROPOFOL N/A 05/17/2016   Procedure: ESOPHAGOGASTRODUODENOSCOPY (EGD) WITH PROPOFOL;  Surgeon: Manus Gunning, MD;  Location: [facility] ENDOSCOPY;  Service: Gastroenterology;  Laterality: N/A;  . NASAL SINUS SURGERY  2006  . SAVORY DILATION N/A 05/17/2016   Procedure: SAVORY DILATION;  Surgeon: Manus Gunning, MD;  Location: [facility];  Service: Gastroenterology;  Laterality: N/A;  . TEE WITHOUT CARDIOVERSION N/A 04/03/2019   Procedure: TRANSESOPHAGEAL ECHOCARDIOGRAM (TEE)-with DCCV;  Surgeon: Dorothy Spark, MD;  Location: [facility] ENDOSCOPY;; EF 55 to 60%.  No LVH.  Normal RV.  Moderate LA dilation.  No thrombus.  Mild RA dilation.  Mild to moderate TR.  Mild aortic valve sclerosis,  no stenosis.  Moderately elevated PA pressures (47 mmHg)  . TONSILLECTOMY AND ADENOIDECTOMY    . TRANSTHORACIC ECHOCARDIOGRAM  2009   Normal LV size and function. EF 60-65%. No regional wall motion and amount is. Mild aortic sclerosis, no stenosis  . TUBAL LIGATION  1979    Immunization History  Administered Date(s) Administered  . PFIZER SARS-COV-2 Vaccination 08/22/2019, 09/18/2019  . Pneumococcal Conjugate-13 01/02/2017  . Pneumococcal Polysaccharide-23 05/06/2013, 07/25/2018  . Td 06/28/2003, 10/16/2013  . Zoster 11/07/2012    MEDICATIONS/ALLERGIES   Current Meds  Medication Sig  . acetaminophen (TYLENOL) 650 MG CR tablet Take 650 mg by mouth in the morning and at bedtime.  . B Complex Vitamins (B COMPLEX 100 PO) Take 1 tablet by mouth every morning.   . bisacodyl (DULCOLAX) 5 MG EC tablet Take 10 mg by mouth at bedtime.  . calcium carbonate (TUMS EX) 750 MG chewable tablet Chew 1-2 tablets by mouth as needed for heartburn (acid reflux).   . Calcium Carbonate-Vitamin D (CALCIUM 600+D PO) Take 1 tablet by mouth in the morning and at bedtime.   . carboxymethylcellulose (REFRESH PLUS) 0.5 % SOLN Place 1 drop into both eyes 3 (three) times daily as needed (dry eyes).   . cetirizine (ZYRTEC) 10 MG tablet Take 10 mg by mouth at bedtime.   . Cholecalciferol (VITAMIN D) 125 MCG (5000 UT) CAPS Take 5,000 Units by mouth every morning.   . clindamycin (CLEOCIN T) 1 % lotion Apply 1 application topically daily as needed (eczema).   . Coenzyme Q-10 100 MG capsule Take 100 mg by mouth every morning.   . diclofenac sodium (VOLTAREN) 1 % GEL Apply 4 g topically 4 (four) times daily as needed (joint pain).   Marland Kitchen diltiazem (CARDIZEM CD) 240 MG 24 hr capsule TAKE 1 CAPSULE BY MOUTH  DAILY  . docusate sodium (COLACE) 250 MG capsule Take 250 mg by mouth at bedtime.  Marland Kitchen ELIQUIS 5 MG TABS tablet TAKE 1 TABLET BY MOUTH  TWICE DAILY  . flecainide (TAMBOCOR) 50 MG tablet Take 1 tablet (50 mg total) by mouth  2 (two) times daily. May take an additional dose of  50 mg ( total of 100 mg)  Twice a day for up to 2  days for break through afib  . fluocinonide (LIDEX) 0.05 % external solution Apply 1 application topically daily as needed (eczema on scalp).   . gabapentin (NEURONTIN) 300 MG capsule TAKE 1 CAPSULE BY MOUTH 3  TIMES DAILY  . GRAPE SEED EXTRACT PO Take 100 mg by mouth 2 (two) times daily.   Marland Kitchen levothyroxine (SYNTHROID) 50 MCG tablet TAKE 1 TABLET BY MOUTH  DAILY  . loratadine (CLARITIN) 10 MG tablet Take 10 mg by mouth every morning.   . Magnesium Bisglycinate (MAG GLYCINATE PO) Take 400 mg by mouth at bedtime.   . methocarbamol (ROBAXIN) 500 MG tablet Take 1 tablet (500 mg total) by mouth every 8 (eight) hours as needed for muscle spasms.  . mirtazapine (REMERON) 15 MG tablet TAKE 1 TABLET BY MOUTH AT  BEDTIME  . oxymetazoline (AFRIN) 0.05 % nasal spray Place 1 spray into both nostrils 2 (two) times daily as needed for congestion.  . RABEprazole (ACIPHEX) 20 MG tablet TAKE 1 TABLET BY MOUTH EVERY DAY  . tretinoin (RETIN-A) 0.05 % cream Apply 1 application topically daily as needed (acne).   . vitamin B-12 (CYANOCOBALAMIN) 1000 MCG tablet Take 1,000 mcg by mouth daily.  . [DISCONTINUED] flecainide (TAMBOCOR) 50 MG tablet TAKE 1 TABLET BY MOUTH  TWICE DAILY  . [DISCONTINUED] morphine (MS CONTIN) 15 MG 12 hr tablet Take 1 tablet (15 mg total) by mouth 3 (three) times daily.  . [DISCONTINUED] nitroGLYCERIN (NITROSTAT) 0.4 MG SL tablet Place 1 tablet (0.4 mg total) under the tongue every 5 (five) minutes as needed for chest pain. (Patient not taking: Reported on 06/10/2020)  . [DISCONTINUED] oxybutynin (DITROPAN-XL) 5 MG 24 hr tablet TAKE 1 TABLET BY MOUTH EVERYDAY AT BEDTIME (Patient not taking: Reported on 06/10/2020)    Allergies  Allergen Reactions  . Cyclobenzaprine Hcl Anaphylaxis and Swelling    Reaction - throat closed, tongue swelling  . Rizatriptan Benzoate Anaphylaxis    Throat and  Tongue Swelling Closed  . Sumatriptan Swelling    Throat closed & Tongue Swelling  . Tizanidine Shortness Of Breath  . Aleve [Naproxen Sodium] Hives  . Clarithromycin Hives  . Doxycycline Diarrhea and Nausea And Vomiting  . Duloxetine Nausea Only  . Egg [Eggs Or Egg-Derived Products] Diarrhea and Nausea And Vomiting  . Keflex [Cephalexin] Itching and Other (See Comments)    Sores  . Metronidazole Diarrhea and Nausea Only  . Oxycodone Hcl Diarrhea and Nausea And Vomiting  . Oxycontin [Oxycodone Hcl] Nausea And Vomiting  . Penicillins Other (See Comments)    Unknown reaction Did it involve swelling of the face/tongue/throat, SOB, or low BP? Unknown Did it involve sudden or severe rash/hives, skin peeling, or any reaction on the inside of your mouth or nose? Unknown Did you need to seek medical attention at a hospital or doctor's office? Unknown When did it last happen?infant If all above answers are "NO", may proceed with cephalosporin use.  . Rosuvastatin Other (See Comments)    Muscle aches  . Venlafaxine Hives  . Voltaren [Diclofenac Sodium] Hives    Reaction to oral med, uses gel occasionally  . Septra [Sulfamethoxazole-Trimethoprim] Itching and Rash  . Tramadol Diarrhea, Nausea And Vomiting and Palpitations    SOCIAL HISTORY/FAMILY HISTORY   Reviewed in Epic:  Pertinent findings: Husband - Dx Stg IV Mets Colon Ca in July 2021  OBJCTIVE -PE, EKG, labs   Wt Readings from Last 3 Encounters:  06/11/20 225 lb 9.6 oz (102.3 kg)  06/10/20 224 lb 9.6  oz (101.9 kg)  06/02/20 227 lb (103 kg)    Physical Exam: BP 132/78   Pulse 71   Ht 5\' 5"  (1.651 m)   Wt 227 lb (103 kg)   SpO2 99%   BMI 37.77 kg/m  Physical Exam Vitals reviewed.  Constitutional:      General: She is not in acute distress.    Appearance: Normal appearance. She is not ill-appearing or toxic-appearing.     Comments: Moderate to morbidly obese.  Well-groomed.  HENT:     Head: Normocephalic and  atraumatic.  Neck:     Vascular: No carotid bruit, hepatojugular reflux or JVD.  Cardiovascular:     Rate and Rhythm: Normal rate and regular rhythm. Occasional extrasystoles are present.    Chest Wall: PMI is not displaced.     Pulses: Intact distal pulses.     Heart sounds: Murmur heard.   Harsh crescendo-decrescendo early systolic murmur is present with a grade of 1/6 at the upper right sternal border. No friction rub. No S4 sounds.   Pulmonary:     Effort: Pulmonary effort is normal. No respiratory distress.     Breath sounds: Normal breath sounds.  Chest:     Chest wall: No tenderness.  Musculoskeletal:        General: Swelling (Trace bilateral lower extremity swelling) present. Normal range of motion.     Cervical back: Normal range of motion and neck supple.  Neurological:     General: No focal deficit present.     Mental Status: She is alert and oriented to person, place, and time. Mental status is at baseline.  Psychiatric:        Mood and Affect: Mood normal.        Behavior: Behavior normal.        Thought Content: Thought content normal.        Judgment: Judgment normal.     Adult ECG Report  Rate: 71 ;  Rhythm: normal sinus rhythm and normal axis, intervals & durations;   Narrative Interpretation: stable  Recent Labs:  reviewed  Lab Results  Component Value Date   CHOL 188 03/12/2020   HDL 50.90 03/12/2020   LDLCALC 108 (H) 03/12/2020   LDLDIRECT 125.0 01/18/2019   TRIG 142.0 03/12/2020   CHOLHDL 4 03/12/2020   Lab Results  Component Value Date   CREATININE 0.73 03/12/2020   BUN 18 03/12/2020   NA 139 03/12/2020   K 4.0 03/12/2020   CL 100 03/12/2020   CO2 31 03/12/2020   Lab Results  Component Value Date   TSH 4.39 03/12/2020    ASSESSMENT/PLAN    Problem List Items Addressed This Visit    Paroxysmal atrial fibrillation ([place]): CHA2D2DSVasc =4; Eliquis - Primary (Chronic)    Doing relatively well - only 1 breakthrough in last ~6  months.  Plan:  Continue Diltiazem 240 mg along with Flecainide 50 mg BID  Continue Eliquis  For Breakthrough Afib:  Take flecainide  double up to 100 mg twice a day for 2 days if break through Afib is still present contact office      Relevant Medications   flecainide (TAMBOCOR) 50 MG tablet   Other Relevant Orders   EKG 12-Lead (Completed)   Hyperlipidemia with target LDL less than 100 (Chronic)    Based upon Coronary Calcium Score - recommend LDL < 100.  Most recent level - 108 which is down from 2020 -->  She would like to avoid statin - we discussed  dietary adjustments.  If not @ goal by next year - would consider low dose rosuvastatin ~10 mg daily.      Relevant Medications   flecainide (TAMBOCOR) 50 MG tablet   Essential hypertension (Chronic)    BP controlled on Diltiazem CD 240 mg.      Relevant Medications   flecainide (TAMBOCOR) 50 MG tablet   Obesity (BMI 30-39.9) (Chronic)    The patient understands the need to lose weight with diet and exercise. We have discussed specific strategies for this. Lowering weight will potentially help HTN & Afib as well as lipids.         COVID-19 Education: The signs and symptoms of COVID-19 were discussed with the patient and how to seek care for testing (follow up with PCP or arrange E-visit).   The importance of social distancing and COVID-19 vaccination was discussed today. 1 min The patient is practicing social distancing & Masking.   I spent a total of 58minutes with the patient spent in direct patient consultation.  Additional time spent with chart review  / charting (studies, outside notes, etc): 9 Total Time: 31 min  Current medicines are reviewed at length with the patient today.  (+/- concerns) n/a  This visit occurred during the SARS-CoV-2 public health emergency.  Safety protocols were in place, including screening questions prior to the visit, additional usage of staff PPE, and extensive cleaning of exam room  while observing appropriate contact time as indicated for disinfecting solutions.  Notice: This dictation was prepared with Dragon dictation along with smaller phrase technology. Any transcriptional errors that result from this process are unintentional and may not be corrected upon review.  Patient Instructions / Medication Changes & Studies & Tests Ordered   Patient Instructions  Medication Instructions:    if you have  Break through - afib Take flecainide  double up to 100 mg twice a day for 2 days if break through Afib ss still present contact office   *If you need a refill on your cardiac medications before your next appointment, please call your pharmacy*   Lab Work: Not needed .   Testing/Procedures: Not needed   Follow-Up: At [facility], you and your health needs are our priority.  As part of our continuing mission to provide you with exceptional heart care, we have created designated Provider Care Teams.  These Care Teams include your primary Cardiologist (physician) and Advanced Practice Providers (APPs -  Physician Assistants and Nurse Practitioners) who all work together to provide you with the care you need, when you need it.     Your next appointment:   12 month(s)  The format for your next appointment:   In Person  Provider:   Glenetta Hew, MD   Other Instructions   Studies Ordered:   Orders Placed This Encounter  Procedures  . EKG 12-Lead     Glenetta Hew, M.D., M.S. Interventional Cardiologist   Pager # [phone] Phone # [phone] [address] [place], [place] [address]   Thank you for choosing Heartcare at [facility]!!

## 2020-06-10 ENCOUNTER — Ambulatory Visit: Payer: Medicare Other | Admitting: Pulmonary Disease

## 2020-06-10 ENCOUNTER — Other Ambulatory Visit: Payer: Self-pay

## 2020-06-10 ENCOUNTER — Encounter: Payer: Self-pay | Admitting: Pulmonary Disease

## 2020-06-10 VITALS — BP 124/60 | HR 75 | Temp 98.8°F | Ht 65.0 in | Wt 224.6 lb

## 2020-06-10 DIAGNOSIS — G4733 Obstructive sleep apnea (adult) (pediatric): Secondary | ICD-10-CM

## 2020-06-10 DIAGNOSIS — G4739 Other sleep apnea: Secondary | ICD-10-CM | POA: Diagnosis not present

## 2020-06-10 DIAGNOSIS — E669 Obesity, unspecified: Secondary | ICD-10-CM

## 2020-06-10 DIAGNOSIS — J9610 Chronic respiratory failure, unspecified whether with hypoxia or hypercapnia: Secondary | ICD-10-CM | POA: Diagnosis not present

## 2020-06-10 DIAGNOSIS — G4730 Sleep apnea, unspecified: Secondary | ICD-10-CM | POA: Diagnosis not present

## 2020-06-10 NOTE — Progress Notes (Signed)
[facility]  Chief Complaint  Patient presents with  . Follow-up    Wears BIPAP-leaks around nasal pillows, chin did not help    Constitutional:  BP 124/60 (BP Location: Left Arm, Cuff Size: Large)   Pulse 75   Temp 98.8 F (37.1 C) (Temporal)   Ht 5\' 5"  (1.651 m)   Wt 224 lb 9.6 oz (101.9 kg)   SpO2 96%   BMI 37.38 kg/m   Past Medical History:  B12 deficiency, Depression, Fibromyalgia, GERD, IBS, HTN, HLD, Hypothyroidism, Migraine HA, DM, A fib  Past Surgical History:  Her  has a past surgical history that includes Appendectomy (1988); Cholecystectomy (2005); Tubal ligation (1979); Tonsillectomy and adenoidectomy; Anterior cervical decomp/discectomy fusion (2009, 1993); Nasal sinus surgery (2006); Abdominal hysterectomy (1988); Esophageal manometry (N/A, 01/04/2016); 24 hour ph study (N/A, 01/04/2016); transthoracic echocardiogram (2009); Back surgery; Adenoidectomy; Esophagogastroduodenoscopy (egd) with propofol (N/A, 05/17/2016); Savory dilation (N/A, 05/17/2016); TEE without cardioversion (N/A, 04/03/2019); and Cardioversion (N/A, 04/03/2019).  Brief Summary:  [name] is a 69 y.o. female with obstructive sleep apnea and treatment emergent central sleep apnea.      Subjective:   She has been using Bipap nightly.  No longer needing supplemental oxygen at night.  Sleeping better.  Occasionally wakes up to use bathroom.  Not napping during the day.  Has nasal cushion mask.  This leaks.  She tried chin strap, but didn't help.  Physical Exam:   Appearance - well kempt   ENMT - no sinus tenderness, no oral exudate, no LAN, Mallampati 4 airway, no stridor  Respiratory - equal breath sounds bilaterally, no wheezing or rales  CV - s1s2 regular rate and rhythm, no murmurs  Ext - no clubbing, no edema  Skin - no rashes  Psych - normal mood and affect   Sleep Tests:   PSG 02/26/15 >> AHI 4.4, SaO2 low 83%. Spent 23.37min with  SpO2 <88%.  ONO with RA 03/11/15 >>test time 8 hrs 50 min. Baseline SpO2 92%, low SpO2 78%. Spent 24 min with SpO2 <88%.  HST 05/28/19 >> AHI 26.9, SpO2 low 64%  Bipap 06/26/19 >> Bipap 17/13 cm H2O.  Treatment emergent centrals.  Bipap 05/10/20 to 06/08/20 >> used on 23 of 30 nights with average 6 hrs 54 min.  Average AHI 1.5 with Bipap 17/13 cm H2O.  Air leak noted.  Cardiac Tests:   TEE 04/03/19 >> EF 55 to 60%, mod LA dilation, mild MR, mild/mod TR, mild AR  Social History:  She  reports that she has never smoked. She has never used smokeless tobacco. She reports current alcohol use of about 1.0 standard drink of alcohol per week. She reports that she does not use drugs.  Family History:  Her family history includes Allergies in her father and mother; Breast cancer in her mother and paternal aunt; Diabetes in her father and mother; Emphysema in her father; Heart disease in her father and mother; Stomach cancer in her maternal uncle.     Assessment/Plan:   Obstructive sleep apnea with treatment emergent central sleep apnea. - she is compliant with Bipap and reports benefit from therapy - she uses [facility] for her DME - continue Bipap 17/13 cm H2O - will have [facility] refit her mask; if she still has trouble with mask leak then could have mask refitting in sleep lab and/or lower Bipap setting  Obesity. - discussed importance of weight loss  Time Spent Involved in Patient Care on Day  of Examination:  21 minutes  Follow up:  Patient Instructions  Will have [facility] refit your Bipap mask  Follow up in 1 year   Medication List:   Allergies as of 06/10/2020      Reactions   Cyclobenzaprine Hcl Anaphylaxis, Swelling   Reaction - throat closed, tongue swelling   Rizatriptan Benzoate Anaphylaxis   Throat and Tongue Swelling Closed   Sumatriptan Swelling   Throat closed & Tongue Swelling   Tizanidine Shortness Of Breath   Aleve [naproxen Sodium] Hives   Clarithromycin  Hives   Doxycycline Diarrhea, Nausea And Vomiting   Duloxetine Nausea Only   Egg [eggs Or Egg-derived Products] Diarrhea, Nausea And Vomiting   Keflex [cephalexin] Itching, Other (See Comments)   Sores   Metronidazole Diarrhea, Nausea Only   Oxycodone Hcl Diarrhea, Nausea And Vomiting   Oxycontin [oxycodone Hcl] Nausea And Vomiting   Penicillins Other (See Comments)   Unknown reaction Did it involve swelling of the face/tongue/throat, SOB, or low BP? Unknown Did it involve sudden or severe rash/hives, skin peeling, or any reaction on the inside of your mouth or nose? Unknown Did you need to seek medical attention at a hospital or doctor's office? Unknown When did it last happen?infant If all above answers are "NO", may proceed with cephalosporin use.   Rosuvastatin Other (See Comments)   Muscle aches   Venlafaxine Hives   Voltaren [diclofenac Sodium] Hives   Reaction to oral med, uses gel occasionally   Septra [sulfamethoxazole-trimethoprim] Itching, Rash   Tramadol Diarrhea, Nausea And Vomiting, Palpitations      Medication List       Accurate as of June 10, 2020 12:50 PM. If you have any questions, ask your nurse or doctor.        STOP taking these medications   nitroGLYCERIN 0.4 MG SL tablet Commonly known as: NITROSTAT Stopped by: Chesley Mires, MD     TAKE these medications   acetaminophen 650 MG CR tablet Commonly known as: TYLENOL Take 650 mg by mouth in the morning and at bedtime.   B COMPLEX 100 PO Take 1 tablet by mouth every morning.   bisacodyl 5 MG EC tablet Commonly known as: DULCOLAX Take 10 mg by mouth at bedtime.   CALCIUM 600+D PO Take 1 tablet by mouth in the morning and at bedtime.   calcium carbonate 750 MG chewable tablet Commonly known as: TUMS EX Chew 1-2 tablets by mouth as needed for heartburn (acid reflux).   carboxymethylcellulose 0.5 % Soln Commonly known as: REFRESH PLUS Place 1 drop into both eyes 3 (three) times daily  as needed (dry eyes).   cetirizine 10 MG tablet Commonly known as: ZYRTEC Take 10 mg by mouth at bedtime.   clindamycin 1 % lotion Commonly known as: CLEOCIN T Apply 1 application topically daily as needed (eczema).   Coenzyme Q-10 100 MG capsule Take 100 mg by mouth every morning.   diclofenac sodium 1 % Gel Commonly known as: VOLTAREN Apply 4 g topically 4 (four) times daily as needed (joint pain).   diltiazem 240 MG 24 hr capsule Commonly known as: CARDIZEM CD TAKE 1 CAPSULE BY MOUTH  DAILY   docusate sodium 250 MG capsule Commonly known as: COLACE Take 250 mg by mouth at bedtime.   Eliquis 5 MG Tabs tablet Generic drug: apixaban TAKE 1 TABLET BY MOUTH  TWICE DAILY   flecainide 50 MG tablet Commonly known as: TAMBOCOR Take 1 tablet (50 mg total) by mouth 2 (two)  times daily. May take an additional dose of  50 mg ( total of 100 mg)  Twice a day for up to 2 days for break through afib   fluocinonide 0.05 % external solution Commonly known as: LIDEX Apply 1 application topically daily as needed (eczema on scalp).   gabapentin 300 MG capsule Commonly known as: NEURONTIN TAKE 1 CAPSULE BY MOUTH 3  TIMES DAILY   GRAPE SEED EXTRACT PO Take 100 mg by mouth 2 (two) times daily.   levothyroxine 50 MCG tablet Commonly known as: SYNTHROID TAKE 1 TABLET BY MOUTH  DAILY   loratadine 10 MG tablet Commonly known as: CLARITIN Take 10 mg by mouth every morning.   MAG GLYCINATE PO Take 400 mg by mouth at bedtime.   methocarbamol 500 MG tablet Commonly known as: ROBAXIN Take 1 tablet (500 mg total) by mouth every 8 (eight) hours as needed for muscle spasms.   mirtazapine 15 MG tablet Commonly known as: REMERON TAKE 1 TABLET BY MOUTH AT  BEDTIME   morphine 15 MG 12 hr tablet Commonly known as: MS Contin Take 1 tablet (15 mg total) by mouth 3 (three) times daily.   oxybutynin 10 MG 24 hr tablet Commonly known as: DITROPAN-XL Take 10 mg by mouth at bedtime. What  changed: Another medication with the same name was removed. Continue taking this medication, and follow the directions you see here. Changed by: Chesley Mires, MD   oxymetazoline 0.05 % nasal spray Commonly known as: AFRIN Place 1 spray into both nostrils 2 (two) times daily as needed for congestion.   RABEprazole 20 MG tablet Commonly known as: ACIPHEX TAKE 1 TABLET BY MOUTH EVERY DAY   tretinoin 0.05 % cream Commonly known as: RETIN-A Apply 1 application topically daily as needed (acne).   vitamin B-12 1000 MCG tablet Commonly known as: CYANOCOBALAMIN Take 1,000 mcg by mouth daily.   Vitamin D 125 MCG (5000 UT) Caps Take 5,000 Units by mouth every morning.       Signature:  Chesley Mires, MD [place] Pager - [phone] 06/10/2020, 12:50 PM

## 2020-06-10 NOTE — Patient Instructions (Signed)
Will have [facility] refit your Bipap mask  Follow up in 1 year

## 2020-06-11 ENCOUNTER — Encounter: Payer: Self-pay | Admitting: Registered Nurse

## 2020-06-11 ENCOUNTER — Encounter: Payer: Medicare Other | Attending: Physical Medicine & Rehabilitation | Admitting: Registered Nurse

## 2020-06-11 VITALS — BP 130/73 | HR 74 | Temp 98.5°F | Ht 65.0 in | Wt 225.6 lb

## 2020-06-11 DIAGNOSIS — G894 Chronic pain syndrome: Secondary | ICD-10-CM | POA: Insufficient documentation

## 2020-06-11 DIAGNOSIS — Z5181 Encounter for therapeutic drug level monitoring: Secondary | ICD-10-CM | POA: Insufficient documentation

## 2020-06-11 DIAGNOSIS — M170 Bilateral primary osteoarthritis of knee: Secondary | ICD-10-CM | POA: Diagnosis not present

## 2020-06-11 DIAGNOSIS — M546 Pain in thoracic spine: Secondary | ICD-10-CM | POA: Diagnosis not present

## 2020-06-11 DIAGNOSIS — Z79891 Long term (current) use of opiate analgesic: Secondary | ICD-10-CM | POA: Insufficient documentation

## 2020-06-11 DIAGNOSIS — G8929 Other chronic pain: Secondary | ICD-10-CM | POA: Insufficient documentation

## 2020-06-11 DIAGNOSIS — M961 Postlaminectomy syndrome, not elsewhere classified: Secondary | ICD-10-CM | POA: Diagnosis not present

## 2020-06-11 MED ORDER — MORPHINE SULFATE ER 15 MG PO TBCR: 15.0000 mg | EXTENDED_RELEASE_TABLET | Freq: Three times a day (TID) | ORAL | 0 refills | Status: DC

## 2020-06-11 NOTE — Progress Notes (Signed)
Subjective:    Patient ID: [name], female    DOB: 1951-04-12, 69 y.o.   MRN: [id number]  HPI: [name] is a 69 y.o. female who returns for follow up appointment for chronic pain and medication refill. She states her pain is located in mid- back. She rates her pain 4. Her current exercise regime is walking and performing stretching exercises.  [name] Morphine equivalent is 45.00 MME.  UDS ordered today.    Pain Inventory Average Pain 5 Pain Right Now 4 My pain is constant, burning and aching  In the last 24 hours, has pain interfered with the following? General activity 6 Relation with others 6 Enjoyment of life 6 What TIME of day is your pain at its worst? evening and night Sleep (in general) Good  Pain is worse with: walking, bending, standing and some activites Pain improves with: rest, heat/ice and medication Relief from Meds: 6  Family History  Problem Relation Age of Onset  . Breast cancer Mother   . Diabetes Mother   . Heart disease Mother   . Allergies Mother   . Diabetes Father   . Heart disease Father   . Emphysema Father   . Allergies Father   . Stomach cancer Maternal Uncle   . Breast cancer Paternal Aunt   . Colon cancer Neg Hx    Social History   Socioeconomic History  . Marital status: Married    Spouse name: Not on file  . Number of children: Not on file  . Years of education: Not on file  . Highest education level: Not on file  Occupational History  . Occupation: Radiation protection practitioner  . Occupation: retired  Tobacco Use  . Smoking status: Never Smoker  . Smokeless tobacco: Never Used  Vaping Use  . Vaping Use: Never used  Substance and Sexual Activity  . Alcohol use: Yes    Alcohol/week: 1.0 standard drink    Types: 1 Glasses of wine per week    Comment: rare  . Drug use: Never  . Sexual activity: Not Currently  Other Topics Concern  . Not on file  Social History Narrative  . Not on file   Social Determinants of Health    Financial Resource Strain: Low Risk   . Difficulty of Paying Living Expenses: Not hard at all  Food Insecurity: No Food Insecurity  . Worried About Charity fundraiser in the Last Year: Never true  . Ran Out of Food in the Last Year: Never true  Transportation Needs: No Transportation Needs  . Lack of Transportation (Medical): No  . Lack of Transportation (Non-Medical): No  Physical Activity: Inactive  . Days of Exercise per Week: 0 days  . Minutes of Exercise per Session: 0 min  Stress: No Stress Concern Present  . Feeling of Stress : Not at all  Social Connections: Not on file   Past Surgical History:  Procedure Laterality Date  . [place] STUDY N/A 01/04/2016   Procedure: [place] STUDY;  Surgeon: Manus Gunning, MD;  Location: [facility];  Service: Gastroenterology;  Laterality: N/A;  . ABDOMINAL HYSTERECTOMY  1988  . ADENOIDECTOMY    . ANTERIOR CERVICAL DECOMP/DISCECTOMY FUSION  2009, 1993   first procedure in ~ 1993/Dr Botero  . APPENDECTOMY  1988  . BACK SURGERY    . CARDIOVERSION N/A 04/03/2019   Procedure: CARDIOVERSION;  Surgeon: Dorothy Spark, MD;  Location: [facility];  Service: Cardiovascular;  Laterality: N/A;  .  CHOLECYSTECTOMY  2005  . ESOPHAGEAL MANOMETRY N/A 01/04/2016   Procedure: ESOPHAGEAL MANOMETRY (EM) 24 HR PH;  Surgeon: Manus Gunning, MD;  Location: [facility];  Service: Gastroenterology;  Laterality: N/A;  . ESOPHAGOGASTRODUODENOSCOPY (EGD) WITH PROPOFOL N/A 05/17/2016   Procedure: ESOPHAGOGASTRODUODENOSCOPY (EGD) WITH PROPOFOL;  Surgeon: Manus Gunning, MD;  Location: [facility];  Service: Gastroenterology;  Laterality: N/A;  . NASAL SINUS SURGERY  2006  . SAVORY DILATION N/A 05/17/2016   Procedure: SAVORY DILATION;  Surgeon: Manus Gunning, MD;  Location: [facility];  Service: Gastroenterology;  Laterality: N/A;  . TEE WITHOUT CARDIOVERSION N/A 04/03/2019   Procedure: TRANSESOPHAGEAL ECHOCARDIOGRAM  (TEE)-with DCCV;  Surgeon: Dorothy Spark, MD;  Location: [facility]OSCOPY;; EF 55 to 60%.  No LVH.  Normal RV.  Moderate LA dilation.  No thrombus.  Mild RA dilation.  Mild to moderate TR.  Mild aortic valve sclerosis, no stenosis.  Moderately elevated PA pressures (47 mmHg)  . TONSILLECTOMY AND ADENOIDECTOMY    . TRANSTHORACIC ECHOCARDIOGRAM  2009   Normal LV size and function. EF 60-65%. No regional wall motion and amount is. Mild aortic sclerosis, no stenosis  . TUBAL LIGATION  1979   Past Surgical History:  Procedure Laterality Date  . [place] STUDY N/A 01/04/2016   Procedure: [place] STUDY;  Surgeon: Manus Gunning, MD;  Location: [facility];  Service: Gastroenterology;  Laterality: N/A;  . ABDOMINAL HYSTERECTOMY  1988  . ADENOIDECTOMY    . ANTERIOR CERVICAL DECOMP/DISCECTOMY FUSION  2009, 1993   first procedure in ~ 1993/Dr Botero  . APPENDECTOMY  1988  . BACK SURGERY    . CARDIOVERSION N/A 04/03/2019   Procedure: CARDIOVERSION;  Surgeon: Dorothy Spark, MD;  Location: [facility];  Service: Cardiovascular;  Laterality: N/A;  . CHOLECYSTECTOMY  2005  . ESOPHAGEAL MANOMETRY N/A 01/04/2016   Procedure: ESOPHAGEAL MANOMETRY (EM) 24 HR PH;  Surgeon: Manus Gunning, MD;  Location: [facility];  Service: Gastroenterology;  Laterality: N/A;  . ESOPHAGOGASTRODUODENOSCOPY (EGD) WITH PROPOFOL N/A 05/17/2016   Procedure: ESOPHAGOGASTRODUODENOSCOPY (EGD) WITH PROPOFOL;  Surgeon: Manus Gunning, MD;  Location: [facility];  Service: Gastroenterology;  Laterality: N/A;  . NASAL SINUS SURGERY  2006  . SAVORY DILATION N/A 05/17/2016   Procedure: SAVORY DILATION;  Surgeon: Manus Gunning, MD;  Location: [facility];  Service: Gastroenterology;  Laterality: N/A;  . TEE WITHOUT CARDIOVERSION N/A 04/03/2019   Procedure: TRANSESOPHAGEAL ECHOCARDIOGRAM (TEE)-with DCCV;  Surgeon: Dorothy Spark, MD;  Location: [facility] ENDOSCOPY;; EF 55 to 60%.  No LVH.  Normal  RV.  Moderate LA dilation.  No thrombus.  Mild RA dilation.  Mild to moderate TR.  Mild aortic valve sclerosis, no stenosis.  Moderately elevated PA pressures (47 mmHg)  . TONSILLECTOMY AND ADENOIDECTOMY    . TRANSTHORACIC ECHOCARDIOGRAM  2009   Normal LV size and function. EF 60-65%. No regional wall motion and amount is. Mild aortic sclerosis, no stenosis  . TUBAL LIGATION  1979   Past Medical History:  Diagnosis Date  . Allergy   . Anemia   . Arthritis   . B12 deficiency   . Complication of anesthesia    slow to wake up   . Depression   . Diabetes mellitus type 2, diet-controlled ([place]) 07/24/2018  . Fibromyalgia   . Generalized hyperhidrosis 04/17/2014  . GERD (gastroesophageal reflux disease)   . Hyperlipemia   . HYPOTHYROIDISM 05/29/2007  . Irritable bowel syndrome   . Migraines  hx of   . OSA (obstructive sleep apnea) 06/26/2019  . Oxygen deficiency    2 L at bedtime  . PVC (premature ventricular contraction)   . Senile calcific aortic valve sclerosis    Aortic Sclerosis-no stenosis  . Unspecified essential hypertension    denies htn on 05/09/16   There were no vitals taken for this visit.  Opioid Risk Score:   Fall Risk Score:  `1  Depression screen PHQ 2/9  Depression screen [facility] 2/9 02/12/2020 12/18/2019 06/19/2019 01/18/2019 08/08/2018 04/24/2018 01/10/2018  Decreased Interest 0 0 1 0 0 0 2  Down, Depressed, Hopeless 0 0 1 0 0 0 0  PHQ - 2 Score 0 0 2 0 0 0 2  Altered sleeping 0 0 - 0 - - 0  Tired, decreased energy 0 0 - 3 - - 1  Change in appetite 0 0 - 0 - - 0  Feeling bad or failure about yourself  0 0 - 0 - - 0  Trouble concentrating 0 0 - 0 - - 1  Moving slowly or fidgety/restless 0 0 - 0 - - 0  Suicidal thoughts 0 0 - 0 - - 0  PHQ-9 Score 0 0 - 3 - - 4  Difficult doing work/chores Not difficult at all - - Not difficult at all - - Not difficult at all  Some recent data might be hidden   Review of Systems  Musculoskeletal: Positive for back pain, gait  problem and neck pain.       Mid back pain, pain in both knees, right shoulder & neck pain  All other systems reviewed and are negative.      Objective:   Physical Exam Vitals and nursing note reviewed.  Constitutional:      Appearance: Normal appearance.  Cardiovascular:     Rate and Rhythm: Normal rate and regular rhythm.     Pulses: Normal pulses.     Heart sounds: Normal heart sounds.  Pulmonary:     Effort: Pulmonary effort is normal.     Breath sounds: Normal breath sounds.  Musculoskeletal:     Cervical back: Normal range of motion and neck supple.     Comments: Normal Muscle Bulk and Muscle Testing Reveals:  Upper Extremities: Full ROM and Muscle Strength 5/5 Thoracic Paraspinal Tenderness: T-7-T-9  Lower Extremities: Full ROM and Muscle Strength 5/5 Arises from chair with ease Narrow Based  Gait   Skin:    General: Skin is warm and dry.  Neurological:     Mental Status: She is alert and oriented to person, place, and time.  Psychiatric:        Mood and Affect: Mood normal.        Behavior: Behavior normal.           Assessment & Plan:  1. Cervical postlaminectomy syndrome:/ Cervicalgia/ Cervical Radiculitis/with neck and shoulder pain/ Chronic Midline Thoracic Pain.Continue exercise routine and using heat therapy.06/11/2020 Continue:current medication regimen. Refilled: MS Contin 15 mg one tablet three times a day #90.  We will continue the opioid monitoring program, this consists of regular clinic visits, examinations, urine drug screen, pill counts as well as use of [place] Controlled Substance Reporting system. A 12 month History has been reviewed on the [place] Controlled Substance Reporting Systemon12/16/2021. 2. Fibromyalgia/ Neuropathy: Continuecurrent medication regimen withGabapentin, activity and exercise regime.06/11/2020. 3.BilateralKnee Pain/ Mild Degenerative Changes:Continuecurrent medication regimen withVoltaren  Gel.06/11/2020. 4.BilateralAnkle Pain:No complaints today.Continue HEP as tolerated. Continueto monitor.06/11/2020. 5. Muscle Spasm: Continuecurrent  medication regimen withRobaxin.Continue to Monitor.06/11/2020.. 6. Opioid Induced Constipation: No complaints Continue to Monitor.06/11/2020. 7. Midline Low Back Pain: Continue current medication regime, continue HEP as tolerated.06/11/2020. 8. Right Gluteal Medius Weakness and Atrophy: No complaints Today:Continue with Hip Abduction Exercisesas tolerated: Continue to Monitor.06/11/2020. 9. RightHip Pain/ Right Greater Trochanteric Tenderness:No complaints Today.Continue to Alternate Ice and Heat Therapy. Continue to Monitor.06/11/2020 10. Sacroiliac Joint Pain:No complaints today.S/PSacroiliac Injection on 08/24/2018 with good relief noted.06/11/2020 11.Lumbar Radiculitis:No complaints today.Continue Gabapentin.Continue HEP as tolerated. Continue current medication regimen. Continue to monitor.06/11/2020.  F/U in 1 month

## 2020-06-13 ENCOUNTER — Encounter: Payer: Self-pay | Admitting: Cardiology

## 2020-06-13 NOTE — Assessment & Plan Note (Signed)
Based upon Coronary Calcium Score - recommend LDL < 100.  Most recent level - 108 which is down from 2020 -->  She would like to avoid statin - we discussed dietary adjustments.  If not @ goal by next year - would consider low dose rosuvastatin ~10 mg daily.

## 2020-06-13 NOTE — Assessment & Plan Note (Signed)
BP controlled on Diltiazem CD 240 mg.

## 2020-06-13 NOTE — Assessment & Plan Note (Signed)
The patient understands the need to lose weight with diet and exercise. We have discussed specific strategies for this. Lowering weight will potentially help HTN & Afib as well as lipids.

## 2020-06-13 NOTE — Assessment & Plan Note (Signed)
Doing relatively well - only 1 breakthrough in last ~6 months.  Plan:  Continue Diltiazem 240 mg along with Flecainide 50 mg BID  Continue Eliquis  For Breakthrough Afib:  Take flecainide  double up to 100 mg twice a day for 2 days if break through Afib is still present contact office

## 2020-06-15 ENCOUNTER — Ambulatory Visit: Payer: Medicare Other | Admitting: Family Medicine

## 2020-06-15 ENCOUNTER — Other Ambulatory Visit: Payer: Self-pay

## 2020-06-15 ENCOUNTER — Encounter: Payer: Self-pay | Admitting: Family Medicine

## 2020-06-15 ENCOUNTER — Ambulatory Visit (INDEPENDENT_AMBULATORY_CARE_PROVIDER_SITE_OTHER): Payer: Medicare Other | Admitting: Family Medicine

## 2020-06-15 VITALS — BP 132/60 | HR 80 | Temp 98.2°F | Ht 64.65 in | Wt 226.5 lb

## 2020-06-15 DIAGNOSIS — E119 Type 2 diabetes mellitus without complications: Secondary | ICD-10-CM

## 2020-06-15 LAB — POCT GLYCOSYLATED HEMOGLOBIN (HGB A1C): Hemoglobin A1C: 7.6 % — AB (ref 4.0–5.6)

## 2020-06-15 NOTE — Progress Notes (Signed)
Millissa Deese T. Tyne Banta, MD, [place] at [facility] [place], [address]  Phone: [phone]  FAX: [phone]  [name] - 69 y.o. female  MRN [id number]  Date of Birth: Apr 29, 1951  Date: 06/15/2020  PCP: Owens Loffler, MD  Referral: Owens Loffler, MD  Chief Complaint  Patient presents with  . Diabetes    This visit occurred during the SARS-CoV-2 public health emergency.  Safety protocols were in place, including screening questions prior to the visit, additional usage of staff PPE, and extensive cleaning of exam room while observing appropriate contact time as indicated for disinfecting solutions.   Subjective:   [name] is a 69 y.o. very pleasant female patient with Body mass index is 38.1 kg/m. who presents with the following:  She is a well-known patient.  Recently her A1c did increase to 8.2.  She has been doing better with her diet, she is able to walk some.  She is able to do some very mild calisthenics at home.  Diabetes Mellitus: Tolerating Medications: yes - recent increase in a1c.  We wanted to give a trial of lifestyle changes as she had been eating poorly during Covid-19.  Compliance with diet: fair, Body mass index is 38.1 kg/m. Exercise: minimal / intermittent Foot problems: none Hypoglycemia: none No nausea, vomitting, blurred vision, polyuria.  Doing some hello fresh food kits  Lab Results  Component Value Date   HGBA1C 7.6 (A) 06/15/2020   HGBA1C 8.2 (H) 03/12/2020   HGBA1C 7.3 (H) 08/01/2019   Lab Results  Component Value Date   MICROALBUR 5.5 (H) 03/12/2020   LDLCALC 108 (H) 03/12/2020   CREATININE 0.73 03/12/2020    Wt Readings from Last 3 Encounters:  06/15/20 226 lb 8 oz (102.7 kg)  06/11/20 225 lb 9.6 oz (102.3 kg)  06/10/20 224 lb 9.6 oz (101.9 kg)      Review of Systems is noted in the HPI, as appropriate  Objective:    BP 132/60   Pulse 80   Temp 98.2 F (36.8 C) (Temporal)   Ht 5' 4.65" (1.642 m)   Wt 226 lb 8 oz (102.7 kg)   SpO2 98%   BMI 38.10 kg/m   GEN: No acute distress; alert,appropriate. PULM: Breathing comfortably in no respiratory distress PSYCH: Normally interactive.  CV: RRR, no m/g/r   Laboratory and Imaging Data: Results for orders placed or performed in visit on 06/15/20  POCT glycosylated hemoglobin (Hb A1C)  Result Value Ref Range   Hemoglobin A1C 7.6 (A) 4.0 - 5.6 %   HbA1c POC (<> result, manual entry)     HbA1c, POC (prediabetic range)     HbA1c, POC (controlled diabetic range)       Assessment and Plan:     ICD-10-CM   1. Diabetes mellitus type 2, diet-controlled (HCC)  E11.9 POCT glycosylated hemoglobin (Hb A1C)   Normalizing BC. Doing better, enthusiastic about diet change.   Orders Placed This Encounter  Procedures  . POCT glycosylated hemoglobin (Hb A1C)    Follow-up: Return for 02/2021 for Medicare Wellness.  Signed,  Maud Deed. Kratos Ruscitti, MD   Outpatient Encounter Medications as of 06/15/2020  Medication Sig  . acetaminophen (TYLENOL) 650 MG CR tablet Take 650 mg by mouth in the morning and at bedtime.  . B Complex Vitamins (B COMPLEX 100 PO) Take 1 tablet by mouth every morning.   . bisacodyl (DULCOLAX) 5  MG EC tablet Take 10 mg by mouth at bedtime.  . calcium carbonate (TUMS EX) 750 MG chewable tablet Chew 1-2 tablets by mouth as needed for heartburn (acid reflux).   . Calcium Carbonate-Vitamin D (CALCIUM 600+D PO) Take 1 tablet by mouth in the morning and at bedtime.   . carboxymethylcellulose (REFRESH PLUS) 0.5 % SOLN Place 1 drop into both eyes 3 (three) times daily as needed (dry eyes).   . cetirizine (ZYRTEC) 10 MG tablet Take 10 mg by mouth at bedtime.   . Cholecalciferol (VITAMIN D) 125 MCG (5000 UT) CAPS Take 5,000 Units by mouth every morning.   . clindamycin (CLEOCIN T) 1 % lotion Apply 1 application topically daily as needed (eczema).    . Coenzyme Q-10 100 MG capsule Take 100 mg by mouth every morning.   . diclofenac sodium (VOLTAREN) 1 % GEL Apply 4 g topically 4 (four) times daily as needed (joint pain).   Marland Kitchen diltiazem (CARDIZEM CD) 240 MG 24 hr capsule TAKE 1 CAPSULE BY MOUTH  DAILY  . docusate sodium (COLACE) 250 MG capsule Take 250 mg by mouth at bedtime.  Marland Kitchen ELIQUIS 5 MG TABS tablet TAKE 1 TABLET BY MOUTH  TWICE DAILY  . flecainide (TAMBOCOR) 50 MG tablet Take 1 tablet (50 mg total) by mouth 2 (two) times daily. May take an additional dose of  50 mg ( total of 100 mg)  Twice a day for up to 2 days for break through afib  . fluocinonide (LIDEX) 0.05 % external solution Apply 1 application topically daily as needed (eczema on scalp).   . gabapentin (NEURONTIN) 300 MG capsule TAKE 1 CAPSULE BY MOUTH 3  TIMES DAILY  . GRAPE SEED EXTRACT PO Take 100 mg by mouth 2 (two) times daily.   Marland Kitchen levothyroxine (SYNTHROID) 50 MCG tablet TAKE 1 TABLET BY MOUTH  DAILY  . loratadine (CLARITIN) 10 MG tablet Take 10 mg by mouth every morning.   . Magnesium Bisglycinate (MAG GLYCINATE PO) Take 400 mg by mouth at bedtime.   . methocarbamol (ROBAXIN) 500 MG tablet Take 1 tablet (500 mg total) by mouth every 8 (eight) hours as needed for muscle spasms.  . mirtazapine (REMERON) 15 MG tablet TAKE 1 TABLET BY MOUTH AT  BEDTIME  . morphine (MS CONTIN) 15 MG 12 hr tablet Take 1 tablet (15 mg total) by mouth 3 (three) times daily.  Marland Kitchen oxybutynin (DITROPAN-XL) 10 MG 24 hr tablet Take 10 mg by mouth at bedtime.  Marland Kitchen oxymetazoline (AFRIN) 0.05 % nasal spray Place 1 spray into both nostrils 2 (two) times daily as needed for congestion.  . RABEprazole (ACIPHEX) 20 MG tablet TAKE 1 TABLET BY MOUTH EVERY DAY  . tretinoin (RETIN-A) 0.05 % cream Apply 1 application topically daily as needed (acne).   . vitamin B-12 (CYANOCOBALAMIN) 1000 MCG tablet Take 1,000 mcg by mouth daily.   No facility-administered encounter medications on file as of 06/15/2020.

## 2020-06-19 LAB — TOXASSURE SELECT,+ANTIDEPR,UR

## 2020-06-24 ENCOUNTER — Other Ambulatory Visit: Payer: Self-pay | Admitting: Cardiology

## 2020-06-30 ENCOUNTER — Telehealth: Payer: Self-pay | Admitting: *Deleted

## 2020-06-30 NOTE — Telephone Encounter (Signed)
Urine drug screen for this encounter is consistent for prescribed medication 

## 2020-07-09 ENCOUNTER — Other Ambulatory Visit (HOSPITAL_COMMUNITY): Payer: Self-pay | Admitting: Physician Assistant

## 2020-07-11 DIAGNOSIS — J9610 Chronic respiratory failure, unspecified whether with hypoxia or hypercapnia: Secondary | ICD-10-CM | POA: Diagnosis not present

## 2020-07-11 DIAGNOSIS — G4733 Obstructive sleep apnea (adult) (pediatric): Secondary | ICD-10-CM | POA: Diagnosis not present

## 2020-07-15 ENCOUNTER — Other Ambulatory Visit: Payer: Self-pay

## 2020-07-15 ENCOUNTER — Encounter: Payer: Self-pay | Admitting: Registered Nurse

## 2020-07-15 ENCOUNTER — Encounter: Payer: Medicare Other | Attending: Physical Medicine & Rehabilitation | Admitting: Registered Nurse

## 2020-07-15 VITALS — BP 119/72 | HR 64 | Temp 98.7°F | Ht 66.5 in | Wt 222.8 lb

## 2020-07-15 DIAGNOSIS — M546 Pain in thoracic spine: Secondary | ICD-10-CM

## 2020-07-15 DIAGNOSIS — G894 Chronic pain syndrome: Secondary | ICD-10-CM

## 2020-07-15 DIAGNOSIS — G8929 Other chronic pain: Secondary | ICD-10-CM | POA: Diagnosis not present

## 2020-07-15 DIAGNOSIS — Z5181 Encounter for therapeutic drug level monitoring: Secondary | ICD-10-CM | POA: Diagnosis not present

## 2020-07-15 DIAGNOSIS — Z79891 Long term (current) use of opiate analgesic: Secondary | ICD-10-CM | POA: Diagnosis not present

## 2020-07-15 MED ORDER — MORPHINE SULFATE ER 15 MG PO TBCR: 15.0000 mg | EXTENDED_RELEASE_TABLET | Freq: Three times a day (TID) | ORAL | 0 refills | Status: DC

## 2020-07-15 NOTE — Progress Notes (Signed)
Subjective:    Patient ID: [name], female    DOB: 10/21/1950, 70 y.o.   MRN: [id number]  HPI: [name] is a 70 y.o. female who returns for follow up appointment for chronic pain and medication refill. She states her pain is located in her mid- back pain. She rates her pain 4. Her current exercise regime is walking and performing stretching exercises.  Ms. Kavanagh Morphine equivalent is 45.00  MME.    Last UDS was Performed on 06/11/2020, it was consistent.    Pain Inventory Average Pain 5 Pain Right Now 4 My pain is constant, burning and aching  In the last 24 hours, has pain interfered with the following? General activity 6 Relation with others 6 Enjoyment of life 6 What TIME of day is your pain at its worst? evening and night Sleep (in general) Good  Pain is worse with: walking, bending, standing and some activites Pain improves with: rest, heat/ice and medication Relief from Meds: 6  Family History  Problem Relation Age of Onset  . Breast cancer Mother   . Diabetes Mother   . Heart disease Mother   . Allergies Mother   . Diabetes Father   . Heart disease Father   . Emphysema Father   . Allergies Father   . Stomach cancer Maternal Uncle   . Breast cancer Paternal Aunt   . Colon cancer Neg Hx    Social History   Socioeconomic History  . Marital status: Married    Spouse name: Not on file  . Number of children: Not on file  . Years of education: Not on file  . Highest education level: Not on file  Occupational History  . Occupation: Radiation protection practitioner  . Occupation: retired  Tobacco Use  . Smoking status: Never Smoker  . Smokeless tobacco: Never Used  Vaping Use  . Vaping Use: Never used  Substance and Sexual Activity  . Alcohol use: Yes    Alcohol/week: 1.0 standard drink    Types: 1 Glasses of wine per week    Comment: rare  . Drug use: Never  . Sexual activity: Not Currently  Other Topics Concern  . Not on file  Social History Narrative  .  Not on file   Social Determinants of Health   Financial Resource Strain: Low Risk   . Difficulty of Paying Living Expenses: Not hard at all  Food Insecurity: No Food Insecurity  . Worried About Charity fundraiser in the Last Year: Never true  . Ran Out of Food in the Last Year: Never true  Transportation Needs: No Transportation Needs  . Lack of Transportation (Medical): No  . Lack of Transportation (Non-Medical): No  Physical Activity: Inactive  . Days of Exercise per Week: 0 days  . Minutes of Exercise per Session: 0 min  Stress: No Stress Concern Present  . Feeling of Stress : Not at all  Social Connections: Not on file   Past Surgical History:  Procedure Laterality Date  . [place] STUDY N/A 01/04/2016   Procedure: [place] STUDY;  Surgeon: Manus Gunning, MD;  Location: [facility] END[facility]COPY;  Service: Gastroenterology;  Laterality: N/A;  . ABDOMINAL HYSTERECTOMY  1988  . ADENOIDECTOMY    . ANTERIOR CERVICAL DECOMP/DISCECTOMY FUSION  2009, 1993   first procedure in ~ 1993/Dr Botero  . APPENDECTOMY  1988  . BACK SURGERY    . CARDIOVERSION N/A 04/03/2019   Procedure: CARDIOVERSION;  Surgeon: Dorothy Spark,  MD;  Location: [place];  Service: Cardiovascular;  Laterality: N/A;  . CHOLECYSTECTOMY  2005  . ESOPHAGEAL MANOMETRY N/A 01/04/2016   Procedure: ESOPHAGEAL MANOMETRY (EM) 24 HR PH;  Surgeon: Manus Gunning, MD;  Location: [facility];  Service: Gastroenterology;  Laterality: N/A;  . ESOPHAGOGASTRODUODENOSCOPY (EGD) WITH PROPOFOL N/A 05/17/2016   Procedure: ESOPHAGOGASTRODUODENOSCOPY (EGD) WITH PROPOFOL;  Surgeon: Manus Gunning, MD;  Location: [facility];  Service: Gastroenterology;  Laterality: N/A;  . NASAL SINUS SURGERY  2006  . SAVORY DILATION N/A 05/17/2016   Procedure: SAVORY DILATION;  Surgeon: Manus Gunning, MD;  Location: [facility];  Service: Gastroenterology;  Laterality: N/A;  . TEE WITHOUT CARDIOVERSION N/A 04/03/2019    Procedure: TRANSESOPHAGEAL ECHOCARDIOGRAM (TEE)-with DCCV;  Surgeon: Dorothy Spark, MD;  Location: [facility] ENDOSCOPY;; EF 55 to 60%.  No LVH.  Normal RV.  Moderate LA dilation.  No thrombus.  Mild RA dilation.  Mild to moderate TR.  Mild aortic valve sclerosis, no stenosis.  Moderately elevated PA pressures (47 mmHg)  . TONSILLECTOMY AND ADENOIDECTOMY    . TRANSTHORACIC ECHOCARDIOGRAM  2009   Normal LV size and function. EF 60-65%. No regional wall motion and amount is. Mild aortic sclerosis, no stenosis  . TUBAL LIGATION  1979   Past Surgical History:  Procedure Laterality Date  . [place] STUDY N/A 01/04/2016   Procedure: [place] STUDY;  Surgeon: Manus Gunning, MD;  Location: [facility];  Service: Gastroenterology;  Laterality: N/A;  . ABDOMINAL HYSTERECTOMY  1988  . ADENOIDECTOMY    . ANTERIOR CERVICAL DECOMP/DISCECTOMY FUSION  2009, 1993   first procedure in ~ 1993/Dr Botero  . APPENDECTOMY  1988  . BACK SURGERY    . CARDIOVERSION N/A 04/03/2019   Procedure: CARDIOVERSION;  Surgeon: Dorothy Spark, MD;  Location: [facility] ENDOSCOPY;  Service: Cardiovascular;  Laterality: N/A;  . CHOLECYSTECTOMY  2005  . ESOPHAGEAL MANOMETRY N/A 01/04/2016   Procedure: ESOPHAGEAL MANOMETRY (EM) 24 HR PH;  Surgeon: Manus Gunning, MD;  Location: [facility];  Service: Gastroenterology;  Laterality: N/A;  . ESOPHAGOGASTRODUODENOSCOPY (EGD) WITH PROPOFOL N/A 05/17/2016   Procedure: ESOPHAGOGASTRODUODENOSCOPY (EGD) WITH PROPOFOL;  Surgeon: Manus Gunning, MD;  Location: [facility];  Service: Gastroenterology;  Laterality: N/A;  . NASAL SINUS SURGERY  2006  . SAVORY DILATION N/A 05/17/2016   Procedure: SAVORY DILATION;  Surgeon: Manus Gunning, MD;  Location: [facility];  Service: Gastroenterology;  Laterality: N/A;  . TEE WITHOUT CARDIOVERSION N/A 04/03/2019   Procedure: TRANSESOPHAGEAL ECHOCARDIOGRAM (TEE)-with DCCV;  Surgeon: Dorothy Spark, MD;  Location: [facility]  ENDOSCOPY;; EF 55 to 60%.  No LVH.  Normal RV.  Moderate LA dilation.  No thrombus.  Mild RA dilation.  Mild to moderate TR.  Mild aortic valve sclerosis, no stenosis.  Moderately elevated PA pressures (47 mmHg)  . TONSILLECTOMY AND ADENOIDECTOMY    . TRANSTHORACIC ECHOCARDIOGRAM  2009   Normal LV size and function. EF 60-65%. No regional wall motion and amount is. Mild aortic sclerosis, no stenosis  . TUBAL LIGATION  1979   Past Medical History:  Diagnosis Date  . Allergy   . Anemia   . Arthritis   . B12 deficiency   . Complication of anesthesia    slow to wake up   . Depression   . Diabetes mellitus type 2, diet-controlled ([place]) 07/24/2018  . Fibromyalgia   . Generalized hyperhidrosis 04/17/2014  . GERD (gastroesophageal reflux disease)   . History of rheumatic fever  as a child   . Hyperlipemia   . HYPOTHYROIDISM 05/29/2007  . Irritable bowel syndrome   . Migraines    hx of   . OSA (obstructive sleep apnea) 06/26/2019  . Oxygen deficiency    2 L at bedtime  . PVC (premature ventricular contraction)   . Senile calcific aortic valve sclerosis    Aortic Sclerosis-no stenosis  . Unspecified essential hypertension    denies htn on 05/09/16   There were no vitals taken for this visit.  Opioid Risk Score:   Fall Risk Score:  `1  Depression screen PHQ 2/9  Depression screen [facility] 2/9 06/11/2020 02/12/2020 12/18/2019 06/19/2019 01/18/2019 08/08/2018 04/24/2018  Decreased Interest 0 0 0 1 0 0 0  Down, Depressed, Hopeless - 0 0 1 0 0 0  PHQ - 2 Score 0 0 0 2 0 0 0  Altered sleeping - 0 0 - 0 - -  Tired, decreased energy - 0 0 - 3 - -  Change in appetite - 0 0 - 0 - -  Feeling bad or failure about yourself  - 0 0 - 0 - -  Trouble concentrating - 0 0 - 0 - -  Moving slowly or fidgety/restless - 0 0 - 0 - -  Suicidal thoughts - 0 0 - 0 - -  PHQ-9 Score - 0 0 - 3 - -  Difficult doing work/chores - Not difficult at all - - Not difficult at all - -  Some recent data might be hidden      Review of Systems  Musculoskeletal: Positive for back pain, gait problem and neck pain.       Knee pain Shoulder pain  All other systems reviewed and are negative.      Objective:   Physical Exam Vitals and nursing note reviewed.  Constitutional:      Appearance: Normal appearance.  Cardiovascular:     Rate and Rhythm: Normal rate and regular rhythm.     Pulses: Normal pulses.     Heart sounds: Normal heart sounds.  Pulmonary:     Effort: Pulmonary effort is normal.     Breath sounds: Normal breath sounds.  Musculoskeletal:     Cervical back: Normal range of motion and neck supple.     Comments: Normal Muscle Bulk and Muscle Testing Reveals:  Upper Extremities: Full ROM and Muscle Strength 5/5 Thoracic Paraspinal Tenderness: T-7-T-9  Lower Extremities : Full ROM and Muscle Strength 5/5 Arises from Table with ease Narrow Based Gait   Skin:    General: Skin is warm and dry.  Neurological:     Mental Status: She is alert and oriented to person, place, and time.  Psychiatric:        Mood and Affect: Mood normal.        Behavior: Behavior normal.           Assessment & Plan:  1. Cervical postlaminectomy syndrome:/ Cervicalgia/ Cervical Radiculitis/with neck and shoulder pain/ Chronic Midline Thoracic Pain.Continue exercise routine and using heat therapy.07/15/2020 Continue:current medication regimen. Refilled:MS Contin 15 mg one tablet three times a day #90.  We will continue the opioid monitoring program, this consists of regular clinic visits, examinations, urine drug screen, pill counts as well as use of [place] Controlled Substance Reporting system. A 12 month History has been reviewed on the [place] Controlled Substance Reporting Systemon01/19/2022. 2. Fibromyalgia/ Neuropathy: Continuecurrent medication regimen withGabapentin, activity and exercise regime.07/15/2020. 3.BilateralKnee Pain/ Mild Degenerative  Changes:Continuecurrent medication regimen withVoltaren  Gel.07/15/2020. 4.BilateralAnkle Pain:No complaints today.Continue HEP as tolerated. Continueto monitor.07/15/2020. 5. Muscle Spasm: Continuecurrent medication regimen withRobaxin.Continue to Monitor.07/15/2020.. 6. Opioid Induced Constipation: No complaints Continue to Monitor.07/15/2020. 7. Midline Low Back Pain: Continue current medication regime, continue HEP as tolerated.07/15/2020. 8. Right Gluteal Medius Weakness and Atrophy: No complaints Today:Continue with Hip Abduction Exercisesas tolerated: Continue to Monitor.07/15/2020. 9. RightHip Pain/ Right Greater Trochanteric Tenderness:No complaints Today.Continue to Alternate Ice and Heat Therapy. Continue to Monitor.07/15/2020 10. Sacroiliac Joint Pain:No complaints today.S/PSacroiliac Injection on 08/24/2018 with good relief noted.07/15/2020 11.Lumbar Radiculitis:No complaints today.Continue Gabapentin.Continue HEP as tolerated. Continue current medication regimen. Continue to monitor.07/15/2020.  F/U in 1 month

## 2020-07-28 ENCOUNTER — Telehealth: Payer: Self-pay

## 2020-07-28 NOTE — Chronic Care Management (AMB) (Addendum)
Chronic Care Management Pharmacy Assistant   Name: [name]  MRN: [id number] DOB: September 09, 1950  Reason for Encounter: Schedule CCM appointment  Patient Question:  1.  Have you seen any other providers since your last visit? Yes 07/15/20 - [name], NP - Physical Medicine 06/15/20- Dr. Owens Loffler- PCP 06/11/20- [name], NP - Physical Medicine 06/10/20- Dr. Chesley Mires Pulmonology- Increased Oxybutynin from 5 mg to 10 mg 06/02/20- Dr. Glenetta Hew- Cardiology- Added additional dose of Flecainide 50 mg if needed. 05/14/20-  [name], NP - Physical Medicine 04/22/20- Malka So, PA- Cardiology  04/17/20-Eunice Marcello Moores, NP - Physical Medicine- Video visit 03/18/20- [name], NP - Physical Medicine 03/16/20- Dr. Owens Loffler- PCP - Decreased Oxybutynin from 10 mg to 5 mg 02/19/20- [name], NP - Physical Medicine- Video visit 02/12/20- Dr. Owens Loffler- PCP  01/15/20- [name], NP - Physical Medicine 12/18/19- [name], NP - Physical Medicine 12/18/19- Malka So, PA- Cardiology 11/12/19- [name], NP - Physical Medicine   PCP : Owens Loffler, MD  Allergies:   Allergies  Allergen Reactions   Cyclobenzaprine Hcl Anaphylaxis and Swelling    Reaction - throat closed, tongue swelling   Rizatriptan Benzoate Anaphylaxis    Throat and Tongue Swelling Closed   Sumatriptan Swelling    Throat closed & Tongue Swelling   Tizanidine Shortness Of Breath   Aleve [Naproxen Sodium] Hives   Clarithromycin Hives   Doxycycline Diarrhea and Nausea And Vomiting   Duloxetine Nausea Only   Egg [Eggs Or Egg-Derived Products] Diarrhea and Nausea And Vomiting   Keflex [Cephalexin] Itching and Other (See Comments)    Sores   Metronidazole Diarrhea and Nausea Only   Oxycodone Hcl Diarrhea and Nausea And Vomiting   Oxycontin [Oxycodone Hcl] Nausea And Vomiting   Penicillins Other (See Comments)    Unknown reaction Did it involve swelling of the  face/tongue/throat, SOB, or low BP? Unknown Did it involve sudden or severe rash/hives, skin peeling, or any reaction on the inside of your mouth or nose? Unknown Did you need to seek medical attention at a hospital or doctor's office? Unknown When did it last happen?   infant   If all above answers are NO, may proceed with cephalosporin use.   Rosuvastatin Other (See Comments)    Muscle aches   Venlafaxine Hives   Voltaren [Diclofenac Sodium] Hives    Reaction to oral med, uses gel occasionally   Septra [Sulfamethoxazole-Trimethoprim] Itching and Rash   Tramadol Diarrhea, Nausea And Vomiting and Palpitations    Medications: Outpatient Encounter Medications as of 07/28/2020  Medication Sig   acetaminophen (TYLENOL) 650 MG CR tablet Take 650 mg by mouth in the morning and at bedtime.   B Complex Vitamins (B COMPLEX 100 PO) Take 1 tablet by mouth every morning.    bisacodyl (DULCOLAX) 5 MG EC tablet Take 10 mg by mouth at bedtime.   calcium carbonate (TUMS EX) 750 MG chewable tablet Chew 1-2 tablets by mouth as needed for heartburn (acid reflux).    Calcium Carbonate-Vitamin D (CALCIUM 600+D PO) Take 1 tablet by mouth in the morning and at bedtime.    carboxymethylcellulose (REFRESH PLUS) 0.5 % SOLN Place 1 drop into both eyes 3 (three) times daily as needed (dry eyes).    cetirizine (ZYRTEC) 10 MG tablet Take 10 mg by mouth at bedtime.    Cholecalciferol (VITAMIN D) 125 MCG (5000 UT) CAPS Take 5,000 Units by mouth every morning.    clindamycin (CLEOCIN  T) 1 % lotion Apply 1 application topically daily as needed (eczema).    Coenzyme Q-10 100 MG capsule Take 100 mg by mouth every morning.    diclofenac sodium (VOLTAREN) 1 % GEL Apply 4 g topically 4 (four) times daily as needed (joint pain).    diltiazem (CARDIZEM CD) 240 MG 24 hr capsule TAKE 1 CAPSULE BY MOUTH EVERY DAY   docusate sodium (COLACE) 250 MG capsule Take 250 mg by mouth at bedtime.   ELIQUIS 5 MG TABS tablet TAKE 1 TABLET BY  MOUTH  TWICE DAILY   flecainide (TAMBOCOR) 50 MG tablet TAKE 1 TABLET BY MOUTH TWICE A DAY   fluocinonide (LIDEX) 0.05 % external solution Apply 1 application topically daily as needed (eczema on scalp).    gabapentin (NEURONTIN) 300 MG capsule TAKE 1 CAPSULE BY MOUTH 3  TIMES DAILY   GRAPE SEED EXTRACT PO Take 100 mg by mouth 2 (two) times daily.    levothyroxine (SYNTHROID) 50 MCG tablet TAKE 1 TABLET BY MOUTH  DAILY   loratadine (CLARITIN) 10 MG tablet Take 10 mg by mouth every morning.    Magnesium Bisglycinate (MAG GLYCINATE PO) Take 400 mg by mouth at bedtime.    methocarbamol (ROBAXIN) 500 MG tablet Take 1 tablet (500 mg total) by mouth every 8 (eight) hours as needed for muscle spasms.   mirtazapine (REMERON) 15 MG tablet TAKE 1 TABLET BY MOUTH AT  BEDTIME   morphine (MS CONTIN) 15 MG 12 hr tablet Take 1 tablet (15 mg total) by mouth 3 (three) times daily.   oxybutynin (DITROPAN-XL) 10 MG 24 hr tablet Take 10 mg by mouth at bedtime.   oxymetazoline (AFRIN) 0.05 % nasal spray Place 1 spray into both nostrils 2 (two) times daily as needed for congestion.   RABEprazole (ACIPHEX) 20 MG tablet TAKE 1 TABLET BY MOUTH EVERY DAY   tretinoin (RETIN-A) 0.05 % cream Apply 1 application topically daily as needed (acne).    vitamin B-12 (CYANOCOBALAMIN) 1000 MCG tablet Take 1,000 mcg by mouth daily.   No facility-administered encounter medications on file as of 07/28/2020.    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 Otte per Debbora Dus, Pharm. D, to schedule CCM follow up appointment. Patient last spoke with [facility] 11/05/19. Follow up appointment was scheduled for 09/14/20 at 11:00 Am.   Are you having any problems with your medications? Patient denies any problems with medications.   What concerns would like to discuss with the pharmacist? No concerns at this time  Patient reminded to have all medications, supplements and any blood glucose and blood pressure readings available for review with Debbora Dus, Pharm. D, at  their telephone visit on 09/14/20 at 11:00 AM.   Follow-Up:  Pharmacist Review and Scheduled Follow-Up With Clinical Pharmacist   Debbora Dus, CPP notified  Margaretmary Dys, [place] [phone]  I have reviewed the care management and care coordination activities outlined in this encounter and I am certifying that I agree with the content of this note. No further action required.  Debbora Dus, PharmD Clinical Pharmacist [facility] at [facility] [phone]

## 2020-08-11 ENCOUNTER — Other Ambulatory Visit: Payer: Self-pay | Admitting: Cardiology

## 2020-08-11 DIAGNOSIS — G4733 Obstructive sleep apnea (adult) (pediatric): Secondary | ICD-10-CM | POA: Diagnosis not present

## 2020-08-11 DIAGNOSIS — J9610 Chronic respiratory failure, unspecified whether with hypoxia or hypercapnia: Secondary | ICD-10-CM | POA: Diagnosis not present

## 2020-08-12 ENCOUNTER — Encounter: Payer: Self-pay | Admitting: Registered Nurse

## 2020-08-12 ENCOUNTER — Other Ambulatory Visit: Payer: Self-pay

## 2020-08-12 ENCOUNTER — Encounter: Payer: Medicare Other | Attending: Physical Medicine & Rehabilitation | Admitting: Registered Nurse

## 2020-08-12 VITALS — BP 126/75 | HR 62 | Temp 98.7°F | Ht 65.0 in | Wt 223.8 lb

## 2020-08-12 DIAGNOSIS — G8929 Other chronic pain: Secondary | ICD-10-CM | POA: Insufficient documentation

## 2020-08-12 DIAGNOSIS — Z79891 Long term (current) use of opiate analgesic: Secondary | ICD-10-CM | POA: Diagnosis not present

## 2020-08-12 DIAGNOSIS — M546 Pain in thoracic spine: Secondary | ICD-10-CM | POA: Insufficient documentation

## 2020-08-12 DIAGNOSIS — Z5181 Encounter for therapeutic drug level monitoring: Secondary | ICD-10-CM | POA: Diagnosis not present

## 2020-08-12 DIAGNOSIS — G894 Chronic pain syndrome: Secondary | ICD-10-CM | POA: Diagnosis not present

## 2020-08-12 DIAGNOSIS — M170 Bilateral primary osteoarthritis of knee: Secondary | ICD-10-CM | POA: Diagnosis not present

## 2020-08-12 DIAGNOSIS — M961 Postlaminectomy syndrome, not elsewhere classified: Secondary | ICD-10-CM | POA: Insufficient documentation

## 2020-08-12 MED ORDER — MORPHINE SULFATE ER 15 MG PO TBCR: 15.0000 mg | EXTENDED_RELEASE_TABLET | Freq: Three times a day (TID) | ORAL | 0 refills | Status: DC

## 2020-08-12 NOTE — Progress Notes (Signed)
Subjective:    Patient ID: [name], female    DOB: Jun 20, 1951, 70 y.o.   MRN: [id number]  HPI: [name] is a 70 y.o. female who returns for follow up appointment for chronic pain and medication refill. She states her pain is located in her mid- back and bilateral knee pain. Also reports she has left foot pain ( sole of her foot), tenderness with palpation. She was instructed to call her podiatrist, she verbalizes understanding. She rates her pain 4. Her current exercise regime is walking and performing stretching exercises.  Ms. Riss Morphine equivalent is 45.00 MME.   Last UDS was Performed on 06/11/2020, it was consistent.      Pain Inventory Average Pain 5 Pain Right Now 4 My pain is constant, burning and aching  In the last 24 hours, has pain interfered with the following? General activity 4 Relation with others 4 Enjoyment of life 4 What TIME of day is your pain at its worst? evening and night Sleep (in general) Good  Pain is worse with: walking, bending, standing and some activites Pain improves with: rest, heat/ice, pacing activities, medication and wave tech therapy Relief from Meds: 5  Family History  Problem Relation Age of Onset  . Breast cancer Mother   . Diabetes Mother   . Heart disease Mother   . Allergies Mother   . Diabetes Father   . Heart disease Father   . Emphysema Father   . Allergies Father   . Stomach cancer Maternal Uncle   . Breast cancer Paternal Aunt   . Colon cancer Neg Hx    Social History   Socioeconomic History  . Marital status: Married    Spouse name: Not on file  . Number of children: Not on file  . Years of education: Not on file  . Highest education level: Not on file  Occupational History  . Occupation: Radiation protection practitioner  . Occupation: retired  Tobacco Use  . Smoking status: Never Smoker  . Smokeless tobacco: Never Used  Vaping Use  . Vaping Use: Never used  Substance and Sexual Activity  . Alcohol use: Yes     Alcohol/week: 1.0 standard drink    Types: 1 Glasses of wine per week    Comment: rare  . Drug use: Never  . Sexual activity: Not Currently  Other Topics Concern  . Not on file  Social History Narrative  . Not on file   Social Determinants of Health   Financial Resource Strain: Low Risk   . Difficulty of Paying Living Expenses: Not hard at all  Food Insecurity: No Food Insecurity  . Worried About Charity fundraiser in the Last Year: Never true  . Ran Out of Food in the Last Year: Never true  Transportation Needs: No Transportation Needs  . Lack of Transportation (Medical): No  . Lack of Transportation (Non-Medical): No  Physical Activity: Inactive  . Days of Exercise per Week: 0 days  . Minutes of Exercise per Session: 0 min  Stress: No Stress Concern Present  . Feeling of Stress : Not at all  Social Connections: Not on file   Past Surgical History:  Procedure Laterality Date  . [place] STUDY N/A 01/04/2016   Procedure: [place] STUDY;  Surgeon: Manus Gunning, MD;  Location: [facility] ENDOSCOPY;  Service: Gastroenterology;  Laterality: N/A;  . ABDOMINAL HYSTERECTOMY  1988  . ADENOIDECTOMY    . ANTERIOR CERVICAL DECOMP/DISCECTOMY FUSION  2009, 1993  first procedure in ~ 1993/Dr Botero  . APPENDECTOMY  1988  . BACK SURGERY    . CARDIOVERSION N/A 04/03/2019   Procedure: CARDIOVERSION;  Surgeon: Dorothy Spark, MD;  Location: [facility] ENDOSCOPY;  Service: Cardiovascular;  Laterality: N/A;  . CHOLECYSTECTOMY  2005  . ESOPHAGEAL MANOMETRY N/A 01/04/2016   Procedure: ESOPHAGEAL MANOMETRY (EM) 24 HR PH;  Surgeon: Manus Gunning, MD;  Location: [facility] ENDOSCOPY;  Service: Gastroenterology;  Laterality: N/A;  . ESOPHAGOGASTRODUODENOSCOPY (EGD) WITH PROPOFOL N/A 05/17/2016   Procedure: ESOPHAGOGASTRODUODENOSCOPY (EGD) WITH PROPOFOL;  Surgeon: Manus Gunning, MD;  Location: [facility] ENDOSCOPY;  Service: Gastroenterology;  Laterality: N/A;  . NASAL SINUS SURGERY  2006  .  SAVORY DILATION N/A 05/17/2016   Procedure: SAVORY DILATION;  Surgeon: Manus Gunning, MD;  Location: [facility];  Service: Gastroenterology;  Laterality: N/A;  . TEE WITHOUT CARDIOVERSION N/A 04/03/2019   Procedure: TRANSESOPHAGEAL ECHOCARDIOGRAM (TEE)-with DCCV;  Surgeon: Dorothy Spark, MD;  Location: [facility] ENDOSCOPY;; EF 55 to 60%.  No LVH.  Normal RV.  Moderate LA dilation.  No thrombus.  Mild RA dilation.  Mild to moderate TR.  Mild aortic valve sclerosis, no stenosis.  Moderately elevated PA pressures (47 mmHg)  . TONSILLECTOMY AND ADENOIDECTOMY    . TRANSTHORACIC ECHOCARDIOGRAM  2009   Normal LV size and function. EF 60-65%. No regional wall motion and amount is. Mild aortic sclerosis, no stenosis  . TUBAL LIGATION  1979   Past Surgical History:  Procedure Laterality Date  . [place] STUDY N/A 01/04/2016   Procedure: [place] STUDY;  Surgeon: Manus Gunning, MD;  Location: [facility] ENDOSCOPY;  Service: Gastroenterology;  Laterality: N/A;  . ABDOMINAL HYSTERECTOMY  1988  . ADENOIDECTOMY    . ANTERIOR CERVICAL DECOMP/DISCECTOMY FUSION  2009, 1993   first procedure in ~ 1993/Dr Botero  . APPENDECTOMY  1988  . BACK SURGERY    . CARDIOVERSION N/A 04/03/2019   Procedure: CARDIOVERSION;  Surgeon: Dorothy Spark, MD;  Location[facility] ENDOSCOPY;  Service: Cardiovascular;  Laterality: N/A;  . CHOLECYSTECTOMY  2005  . ESOPHAGEAL MANOMETRY N/A 01/04/2016   Procedure: ESOPHAGEAL MANOMETRY (EM) 24 HR PH;  Surgeon: Manus Gunning, MD;  Location: [facility] ENDOSCOPY;  Service: Gastroenterology;  Laterality: N/A;  . ESOPHAGOGASTRODUODENOSCOPY (EGD) WITH PROPOFOL N/A 05/17/2016   Procedure: ESOPHAGOGASTRODUODENOSCOPY (EGD) WITH PROPOFOL;  Surgeon: Manus Gunning, MD;  Location: [facility] ENDOSCOPY;  Service: Gastroenterology;  Laterality: N/A;  . NASAL SINUS SURGERY  2006  . SAVORY DILATION N/A 05/17/2016   Procedure: SAVORY DILATION;  Surgeon: Manus Gunning, MD;  Location:  [facility] ENDOSCOPY;  Service: Gastroenterology;  Laterality: N/A;  . TEE WITHOUT CARDIOVERSION N/A 04/03/2019   Procedure: TRANSESOPHAGEAL ECHOCARDIOGRAM (TEE)-with DCCV;  Surgeon: Dorothy Spark, MD;  Location: [facility] ENDOSCOPY;; EF 55 to 60%.  No LVH.  Normal RV.  Moderate LA dilation.  No thrombus.  Mild RA dilation.  Mild to moderate TR.  Mild aortic valve sclerosis, no stenosis.  Moderately elevated PA pressures (47 mmHg)  . TONSILLECTOMY AND ADENOIDECTOMY    . TRANSTHORACIC ECHOCARDIOGRAM  2009   Normal LV size and function. EF 60-65%. No regional wall motion and amount is. Mild aortic sclerosis, no stenosis  . TUBAL LIGATION  1979   Past Medical History:  Diagnosis Date  . Allergy   . Anemia   . Arthritis   . B12 deficiency   . Complication of anesthesia    slow to wake up   . Depression   .  Diabetes mellitus type 2, diet-controlled ([place]) 07/24/2018  . Fibromyalgia   . Generalized hyperhidrosis 04/17/2014  . GERD (gastroesophageal reflux disease)   . History of rheumatic fever as a child   . Hyperlipemia   . HYPOTHYROIDISM 05/29/2007  . Irritable bowel syndrome   . Migraines    hx of   . OSA (obstructive sleep apnea) 06/26/2019  . Oxygen deficiency    2 L at bedtime  . PVC (premature ventricular contraction)   . Senile calcific aortic valve sclerosis    Aortic Sclerosis-no stenosis  . Unspecified essential hypertension    denies htn on 05/09/16   BP 126/75   Pulse 62   Temp 98.7 F (37.1 C)   Ht 5\' 5"  (1.651 m)   Wt 223 lb 12.8 oz (101.5 kg)   SpO2 96%   BMI 37.24 kg/m   Opioid Risk Score:   Fall Risk Score:  `1  Depression screen PHQ 2/9  Depression screen [facility] 2/9 06/11/2020 02/12/2020 12/18/2019 06/19/2019 01/18/2019 08/08/2018 04/24/2018  Decreased Interest 0 0 0 1 0 0 0  Down, Depressed, Hopeless - 0 0 1 0 0 0  PHQ - 2 Score 0 0 0 2 0 0 0  Altered sleeping - 0 0 - 0 - -  Tired, decreased energy - 0 0 - 3 - -  Change in appetite - 0 0 - 0 - -  Feeling bad or  failure about yourself  - 0 0 - 0 - -  Trouble concentrating - 0 0 - 0 - -  Moving slowly or fidgety/restless - 0 0 - 0 - -  Suicidal thoughts - 0 0 - 0 - -  PHQ-9 Score - 0 0 - 3 - -  Difficult doing work/chores - Not difficult at all - - Not difficult at all - -  Some recent data might be hidden   Review of Systems  Musculoskeletal: Positive for back pain and gait problem.       Pain in both knees & sometimes both ankles  All other systems reviewed and are negative.      Objective:   Physical Exam Vitals and nursing note reviewed.  Constitutional:      Appearance: Normal appearance.  Cardiovascular:     Rate and Rhythm: Normal rate and regular rhythm.     Pulses: Normal pulses.     Heart sounds: Normal heart sounds.  Pulmonary:     Effort: Pulmonary effort is normal.     Breath sounds: Normal breath sounds.  Musculoskeletal:     Cervical back: Normal range of motion and neck supple.     Comments: Normal Muscle Bulk and Muscle Testing Reveals:  Upper Extremities: Full ROM and Muscle Strength 5/5  Thoracic Paraspinal Tenderness: T-7-T-9  Lower Extremities: Full ROM and Muscle Strength 5/5 Arises from chair with ease Narrow Based Gait   Skin:    General: Skin is warm and dry.  Neurological:     Mental Status: She is alert and oriented to person, place, and time.  Psychiatric:        Mood and Affect: Mood normal.        Behavior: Behavior normal.           Assessment & Plan:  1. Cervical postlaminectomy syndrome:/ Cervicalgia/ Cervical Radiculitis/with neck and shoulder pain/ Chronic Midline Thoracic Pain.Continue exercise routine and using heat therapy.08/12/2020 Continue:current medication regimen. Refilled:MS Contin 15 mg one tablet three times a day #90.  We will continue the opioid  monitoring program, this consists of regular clinic visits, examinations, urine drug screen, pill counts as well as use of [place] Controlled Substance Reporting  system. A 12 month History has been reviewed on the [place] Controlled Substance Reporting Systemon02/16/2022. 2. Fibromyalgia/ Neuropathy: Continuecurrent medication regimen withGabapentin, activity and exercise regime.08/12/2020. 3.BilateralKnee Pain/ Mild Degenerative Changes:Continuecurrent medication regimen withVoltaren Gel.08/12/2020. 4.BilateralAnkle Pain:No complaints today.Continue HEP as tolerated. Continueto monitor.08/12/2020. 5. Muscle Spasm: Continuecurrent medication regimen withRobaxin.Continue to Monitor.08/12/2020.. 6. Opioid Induced Constipation: No complaints Continue to Monitor.08/12/2020. 7. Midline Low Back Pain: Continue current medication regime, continue HEP as tolerated.08/12/2020. 8. Right Gluteal Medius Weakness and Atrophy: No complaints Today:Continue with Hip Abduction Exercisesas tolerated: Continue to Monitor.08/12/2020. 9. RightHip Pain/ Right Greater Trochanteric Tenderness:No complaints Today.Continue to Alternate Ice and Heat Therapy. Continue to Monitor.08/12/2020 10. Sacroiliac Joint Pain:No complaints today.S/PSacroiliac Injection on 08/24/2018 with good relief noted.08/12/2020 11.Lumbar Radiculitis:No complaints today.Continue Gabapentin.Continue HEP as tolerated. Continue current medication regimen. Continue to monitor.08/12/2020.  F/U in 1 month

## 2020-08-12 NOTE — Telephone Encounter (Signed)
Prescription refill request for Eliquis received. Indication: atrial fibrillation Last office visit: 12/21  harding Scr:0.73  9/21 Age: 70 Weight:101.1 kg  Prescription refilled

## 2020-08-14 ENCOUNTER — Other Ambulatory Visit: Payer: Self-pay | Admitting: Unknown Physician Specialty

## 2020-08-14 ENCOUNTER — Telehealth: Payer: Self-pay | Admitting: Unknown Physician Specialty

## 2020-08-14 DIAGNOSIS — E669 Obesity, unspecified: Secondary | ICD-10-CM

## 2020-08-14 DIAGNOSIS — Z03818 Encounter for observation for suspected exposure to other biological agents ruled out: Secondary | ICD-10-CM | POA: Diagnosis not present

## 2020-08-14 DIAGNOSIS — U071 COVID-19: Secondary | ICD-10-CM

## 2020-08-14 DIAGNOSIS — I10 Essential (primary) hypertension: Secondary | ICD-10-CM

## 2020-08-14 DIAGNOSIS — Z20822 Contact with and (suspected) exposure to covid-19: Secondary | ICD-10-CM | POA: Diagnosis not present

## 2020-08-14 DIAGNOSIS — I4891 Unspecified atrial fibrillation: Secondary | ICD-10-CM

## 2020-08-14 NOTE — Telephone Encounter (Signed)
I connected by phone with [name] on 08/14/2020 at 3:37 PM to discuss the potential use of a new treatment for mild to moderate COVID-19 viral infection in non-hospitalized patients.  This patient is a 70 y.o. female that meets the FDA criteria for Emergency Use Authorization of COVID monoclonal antibody sotrovimab.  Has a (+) direct SARS-CoV-2 viral test result  Has mild or moderate COVID-19   Is NOT hospitalized due to COVID-19  Is within 10 days of symptom onset  Has at least one of the high risk factor(s) for progression to severe COVID-19 and/or hospitalization as defined in EUA.  Specific high risk criteria : Older age (>/= 69 yo), BMI > 25 and Cardiovascular disease or hypertension   I have spoken and communicated the following to the patient or parent/caregiver regarding COVID monoclonal antibody treatment:  1. FDA has authorized the emergency use for the treatment of mild to moderate COVID-19 in adults and pediatric patients with positive results of direct SARS-CoV-2 viral testing who are 51 years of age and older weighing at least 40 kg, and who are at high risk for progressing to severe COVID-19 and/or hospitalization.  2. The significant known and potential risks and benefits of COVID monoclonal antibody, and the extent to which such potential risks and benefits are unknown.  3. Information on available alternative treatments and the risks and benefits of those alternatives, including clinical trials.  4. Patients treated with COVID monoclonal antibody should continue to self-isolate and use infection control measures (e.g., wear mask, isolate, social distance, avoid sharing personal items, clean and disinfect "high touch" surfaces, and frequent handwashing) according to CDC guidelines.   5. The patient or parent/caregiver has the option to accept or refuse COVID monoclonal antibody treatment.  After reviewing this information with the patient, the patient has agreed to  receive one of the available covid 19 monoclonal antibodies and will be provided an appropriate fact sheet prior to infusion. Kathrine Haddock, NP 08/14/2020 3:37 PM  Sx onset 2/15

## 2020-08-15 ENCOUNTER — Ambulatory Visit (HOSPITAL_COMMUNITY)
Admission: RE | Admit: 2020-08-15 | Discharge: 2020-08-15 | Disposition: A | Discharge status: Home / Self Care | Payer: Medicare Other | Source: Ambulatory Visit | Attending: Pulmonary Disease | Admitting: Pulmonary Disease

## 2020-08-15 DIAGNOSIS — U071 COVID-19: Secondary | ICD-10-CM | POA: Diagnosis not present

## 2020-08-15 DIAGNOSIS — I4891 Unspecified atrial fibrillation: Secondary | ICD-10-CM

## 2020-08-15 DIAGNOSIS — E669 Obesity, unspecified: Secondary | ICD-10-CM

## 2020-08-15 DIAGNOSIS — I10 Essential (primary) hypertension: Secondary | ICD-10-CM

## 2020-08-15 MED ORDER — METHYLPREDNISOLONE SODIUM SUCC 125 MG IJ SOLR: 125.0000 mg | Freq: Once | INTRAMUSCULAR | Status: DC | PRN

## 2020-08-15 MED ORDER — SOTROVIMAB 500 MG/8ML IV SOLN
500.0000 mg | Freq: Once | INTRAVENOUS | Status: AC
  Administered 2020-08-15: 500 mg via INTRAVENOUS

## 2020-08-15 MED ORDER — EPINEPHRINE 0.3 MG/0.3ML IJ SOAJ: 0.3000 mg | Freq: Once | INTRAMUSCULAR | Status: DC | PRN

## 2020-08-15 MED ORDER — FAMOTIDINE IN NACL 20-0.9 MG/50ML-% IV SOLN: 20.0000 mg | Freq: Once | INTRAVENOUS | Status: DC | PRN

## 2020-08-15 MED ORDER — ALBUTEROL SULFATE HFA 108 (90 BASE) MCG/ACT IN AERS: 2.0000 | INHALATION_SPRAY | Freq: Once | RESPIRATORY_TRACT | Status: DC | PRN

## 2020-08-15 MED ORDER — SODIUM CHLORIDE 0.9 % IV SOLN: INTRAVENOUS | Status: DC | PRN

## 2020-08-15 MED ORDER — DIPHENHYDRAMINE HCL 50 MG/ML IJ SOLN: 50.0000 mg | Freq: Once | INTRAMUSCULAR | Status: DC | PRN

## 2020-08-15 NOTE — Progress Notes (Signed)
Patient reviewed Fact Sheet for Patients, Parents, and Caregivers for Emergency Use Authorization (EUA) of sotrovimab for the Treatment of Coronavirus. Patient also reviewed and is agreeable to the estimated cost of treatment. Patient is agreeable to proceed.   

## 2020-08-15 NOTE — Progress Notes (Signed)
Diagnosis: COVID-19  Physician: Dr. Patrick Wright  Procedure: Covid Infusion Clinic Med: Sotrovimab infusion - Provided patient with sotrovimab fact sheet for patients, parents, and caregivers prior to infusion.   Complications: No immediate complications noted  Discharge: Discharged home    

## 2020-08-15 NOTE — Discharge Instructions (Signed)
10 Things You Can Do to Manage Your COVID-19 Symptoms at Home If you have possible or confirmed COVID-19: 1. Stay home except to get medical care. 2. Monitor your symptoms carefully. If your symptoms get worse, call your healthcare provider immediately. 3. Get rest and stay hydrated. 4. If you have a medical appointment, call the healthcare provider ahead of time and tell them that you have or may have COVID-19. 5. For medical emergencies, call 911 and notify the dispatch personnel that you have or may have COVID-19. 6. Cover your cough and sneezes with a tissue or use the inside of your elbow. 7. Wash your hands often with soap and water for at least 20 seconds or clean your hands with an alcohol-based hand sanitizer that contains at least 60% alcohol. 8. As much as possible, stay in a specific room and away from other people in your home. Also, you should use a separate bathroom, if available. If you need to be around other people in or outside of the home, wear a mask. 9. Avoid sharing personal items with other people in your household, like dishes, towels, and bedding. 10. Clean all surfaces that are touched often, like counters, tabletops, and doorknobs. Use household cleaning sprays or wipes according to the label instructions. cdc.gov/coronavirus 01/10/2020 This information is not intended to replace advice given to you by your health care provider. Make sure you discuss any questions you have with your health care provider. Document Revised: 04/27/2020 Document Reviewed: 04/27/2020 Elsevier Patient Education  2021 Elsevier Inc.  What types of side effects do monoclonal antibody drugs cause?  Common side effects  In general, the more common side effects caused by monoclonal antibody drugs include: . Allergic reactions, such as hives or itching . Flu-like signs and symptoms, including chills, fatigue, fever, and muscle aches and pains . Nausea, vomiting . Diarrhea . Skin  rashes . Low blood pressure  If you have any questions or concerns after the infusion please call the Advanced Practice Provider on call at [phone]. This number is ONLY intended for your use regarding questions or concerns about the infusion post-treatment side-effects.  Please do not provide this number to others for use. For return to work notes please contact your primary care provider.   If someone you know is interested in receiving treatment please have them contact their MD for a referral or visit [email or web address]   

## 2020-08-18 ENCOUNTER — Ambulatory Visit: Payer: Medicare Other | Admitting: Podiatry

## 2020-08-25 ENCOUNTER — Telehealth (HOSPITAL_COMMUNITY): Payer: Self-pay | Admitting: *Deleted

## 2020-08-25 NOTE — Telephone Encounter (Signed)
Patient called in stating she is having "afib for the 3rd time in recent months. Started with extreme nausea about 1am. Took something for the vomiting which thankfully stopped about 3:30am, but HR still over 130 laying down. When I sit or stand it goes higher. Not sure if vomiting causes A-fib or A-fib causes vomiting but it is miserable! "  BP 102/63 HR 100-140 pt is just recovering from covid last week. Had nausea/vomiting last PM that has stopped but not very much intake today so far.  Discussed with Adline Peals PA will take extra 50mg  of flecainide and normal dose of 50mg  at bedtime. Call tomorrow with update. Pt verbalized agreement.

## 2020-08-26 NOTE — Telephone Encounter (Signed)
Patient called to update she converted back into NSR after extra dose of 50mg  of flecainide. Feeling back to baseline.  Pt will call if issues arise prior to follow up in April.

## 2020-08-28 ENCOUNTER — Ambulatory Visit: Payer: Medicare Other | Admitting: Podiatry

## 2020-08-28 ENCOUNTER — Ambulatory Visit (INDEPENDENT_AMBULATORY_CARE_PROVIDER_SITE_OTHER): Payer: Medicare Other

## 2020-08-28 ENCOUNTER — Other Ambulatory Visit: Payer: Self-pay | Admitting: Podiatry

## 2020-08-28 ENCOUNTER — Other Ambulatory Visit: Payer: Self-pay

## 2020-08-28 ENCOUNTER — Encounter: Payer: Self-pay | Admitting: Podiatry

## 2020-08-28 DIAGNOSIS — M2042 Other hammer toe(s) (acquired), left foot: Secondary | ICD-10-CM

## 2020-08-28 DIAGNOSIS — M205X2 Other deformities of toe(s) (acquired), left foot: Secondary | ICD-10-CM

## 2020-08-28 DIAGNOSIS — M722 Plantar fascial fibromatosis: Secondary | ICD-10-CM | POA: Diagnosis not present

## 2020-08-30 NOTE — Progress Notes (Signed)
HPI: 70 y.o. female presenting today as an established patient for a new complaint regarding pain and tenderness to the left foot.  Patient states that she has a symptomatic knot to the plantar aspect of the left foot along the arch.  Is very tender to touch and painful with all shoe gear.  She tried different shoe gear modifications with minimal improvement. Patient also has a history of hammertoe and bunion surgery to the left foot.  She states that since the surgery she has had deviation of the left great toe that is very painful and sensitive.  She would like to discuss the possibility of surgery to correct and straighten the toe.  She also has a symptomatic hammertoe to the left third toe that is been symptomatic and painful ever since surgery.  She presents for further treatment and evaluation  Past Medical History:  Diagnosis Date  . Allergy   . Anemia   . Arthritis   . B12 deficiency   . Complication of anesthesia    slow to wake up   . Depression   . Diabetes mellitus type 2, diet-controlled ([place]) 07/24/2018  . Fibromyalgia   . Generalized hyperhidrosis 04/17/2014  . GERD (gastroesophageal reflux disease)   . History of rheumatic fever as a child   . Hyperlipemia   . HYPOTHYROIDISM 05/29/2007  . Irritable bowel syndrome   . Migraines    hx of   . OSA (obstructive sleep apnea) 06/26/2019  . Oxygen deficiency    2 L at bedtime  . PVC (premature ventricular contraction)   . Senile calcific aortic valve sclerosis    Aortic Sclerosis-no stenosis  . Unspecified essential hypertension    denies htn on 05/09/16     Physical Exam: General: The patient is alert and oriented x3 in no acute distress.  Dermatology: Skin is warm, dry and supple bilateral lower extremities. Negative for open lesions or macerations.  Vascular: Palpable pedal pulses bilaterally. No edema or erythema noted. Capillary refill within normal limits.  Neurological: Epicritic and protective threshold  grossly intact bilaterally.   Musculoskeletal Exam: Lateral deviation of the left great toe at the level of the IPJ noted consistent with the radiographic findings.  Rigid with minimal range of motion.  There is also symptomatic hammertoe contracture and pain on palpation to the third MTPJ of the left foot.  Finally there is a nonadherent palpable lesion along the medial longitudinal arch of the left foot consistent with findings of a plantar fibroma with associated tenderness to palpation  Radiographic Exam:  Orthopedic hardware noted to the first metatarsal and head of the second metatarsal left foot.  This appears stable and intact.  Degenerative changes with deviation of the IPJ noted to the left great toe joint.  The third metatarsal also appears to be elongated compared to the respective adjacent metatarsals protruding past the metatarsal parabola.  Hammertoe contracture also noted to the third digit  Assessment: 1.  Hammertoe third digit left foot 2.  Plantar fibroma left foot 3.  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 Patient evaluated. X-Rays reviewed.  2. 2. Today we discussed the conservative versus surgical management of the presenting pathology. The patient opts for surgical management. All possible complications and details of the procedure were explained. All patient questions were answered. No guarantees were expressed or implied. 3. Authorization for surgery was initiated today. Surgery will consist of hallux IPJ arthrodesis left.  Excision of plantar fibroma  left.  PIPJ arthroplasty with MTPJ capsulotomy and Weil shortening osteotomy third digit left. 4.  The patient does have a history of wound dehiscence and ulcer that developed postoperatively in her last surgery.  We will do everything to avoid this and the patient understands the risks and benefits that there are no guarantees associated to the surgery.  She would like to proceed and  have it corrected however. 5.  Patient will need cardiac and medical clearance.  Over the past year the patient has been diagnosed with A. fib.  She is on anticoagulant. 6.  Return to clinic 1 week postop        Edrick Kins, DPM [facility]  Dr. Edrick Kins, DPM    [address], [place] [address]                Office [phone]  Fax [phone]

## 2020-08-31 ENCOUNTER — Other Ambulatory Visit (HOSPITAL_COMMUNITY): Payer: Self-pay | Admitting: Physician Assistant

## 2020-08-31 ENCOUNTER — Encounter: Payer: Self-pay | Admitting: Podiatry

## 2020-09-02 ENCOUNTER — Telehealth: Payer: Self-pay | Admitting: Cardiology

## 2020-09-02 ENCOUNTER — Encounter: Payer: Self-pay | Admitting: Podiatry

## 2020-09-02 NOTE — Telephone Encounter (Signed)
       [facility] Pre-operative Risk Assessment    HEARTCARE STAFF: - Please ensure there is not already an duplicate clearance open for this procedure. - Under Visit Info/Reason for Call, type in Other and utilize the format Clearance MM/DD/YY or Clearance TBD. Do not use dashes or single digits. - If request is for dental extraction, please clarify the # of teeth to be extracted.  Request for surgical clearance:  1. What type of surgery is being performed? Metatarsal Osteotomy, capsulotomy, MPJ release joint, hammer toe repair (left foot), hallux IPJ fusion left foot   2. When is this surgery scheduled? 09/24/20  3. What type of clearance is required (medical clearance vs. Pharmacy clearance to hold med vs. Both)? Both  4. Are there any medications that need to be held prior to surgery and how long? Any necessary meds the pt needs to held prior surgery  5. Practice name and name of physician performing surgery? Dr. Daylene Katayama  6. What is the office phone number? [phone]   7.   What is the office fax number? [phone]  8.   Anesthesia type (None, local, MAC, general) ? Choice of the anesthesiologist at the [facility] specialty surgery center   Selinda Orion 09/02/2020, 2:09 PM  _________________________________________________________________   (provider comments below)

## 2020-09-02 NOTE — Progress Notes (Signed)
[name],   She needs to have a formal medical evaluation for possible medical clearance for possible foot surgery and general anesthesia.  She needs general medical office visit anyway.

## 2020-09-02 NOTE — Progress Notes (Signed)
Appointment scheduled for 09/14/2020 at 2:00 pm for surgical clearance with Dr. Lorelei Pont.

## 2020-09-02 NOTE — Progress Notes (Signed)
[name],   I will have our office set up formal medical evaluation.  Electronically Signed  By: Owens Loffler, MD On: 09/02/2020 2:20 PM

## 2020-09-03 NOTE — Telephone Encounter (Signed)
   Primary Cardiologist: Glenetta Hew, MD  Chart reviewed as part of pre-operative protocol coverage. Given past medical history and time since last visit, based on ACC/AHA guidelines, [name] would be at acceptable risk for the planned procedure without further cardiovascular testing.   Patient with diagnosis of afib on Eliquis for anticoagulation.    Procedure: Metatarsal Osteotomy,capsulotomy, MPJ release joint, hammer toe repair (left foot), hallux IPJ fusion left foot  Date of procedure: 09/24/20  CHA2DS2-VASc Score = 5  This indicates a 7.2% annual risk of stroke. The patient's score is based upon: CHF History: No HTN History: Yes Diabetes History: Yes Stroke History: No Vascular Disease History: Yes Age Score: 1 Gender Score: 1   CrCl 64mL/min Platelet count 236K  Per office protocol, patient can hold Eliquis for 2-3 days prior to procedure.   Patient was advised that if she develops new symptoms prior to surgery to contact our office to arrange a follow-up appointment.  He verbalized understanding.  I will route this recommendation to the requesting party via Epic fax function and remove from pre-op pool.  Please call with questions.  Jossie Ng. Jamarkis Branam NP-C    09/03/2020, 11:57 AM [place] [address] Office [phone] Fax [phone]

## 2020-09-03 NOTE — Telephone Encounter (Signed)
Patient with diagnosis of afib on Eliquis for anticoagulation.    Procedure: Metatarsal Osteotomy, capsulotomy, MPJ release joint, hammer toe repair (left foot), hallux IPJ fusion left foot   Date of procedure: 09/24/20  CHA2DS2-VASc Score = 5  This indicates a 7.2% annual risk of stroke. The patient's score is based upon: CHF History: No HTN History: Yes Diabetes History: Yes Stroke History: No Vascular Disease History: Yes Age Score: 1 Gender Score: 1   CrCl 61mL/min Platelet count 236K  Per office protocol, patient can hold Eliquis for 2-3 days prior to procedure.

## 2020-09-08 ENCOUNTER — Telehealth: Payer: Self-pay | Admitting: Urology

## 2020-09-08 DIAGNOSIS — G4733 Obstructive sleep apnea (adult) (pediatric): Secondary | ICD-10-CM | POA: Diagnosis not present

## 2020-09-08 DIAGNOSIS — J9610 Chronic respiratory failure, unspecified whether with hypoxia or hypercapnia: Secondary | ICD-10-CM | POA: Diagnosis not present

## 2020-09-08 NOTE — Telephone Encounter (Signed)
DOS: 09/24/20  METATARSAL OSTEOTOMY 3RD LEFT-- 61607 CAPSULOTOMY MPJ RELEASE JOINT 3RD LEFT-- 28270 HAMMERTOE REPAIR 3RD LEFT -- 28285 HALLUS IPJFUSION LEFT -- 28760   [facility] SITE CPT CODES 37106, 28270, 28285 AND 26948 Notification or Prior Authorization is not required for the requested services  Decision ID #:[id number]

## 2020-09-09 ENCOUNTER — Other Ambulatory Visit: Payer: Self-pay

## 2020-09-09 ENCOUNTER — Encounter: Payer: Self-pay | Admitting: Registered Nurse

## 2020-09-09 ENCOUNTER — Encounter: Payer: Medicare Other | Attending: Physical Medicine & Rehabilitation | Admitting: Registered Nurse

## 2020-09-09 VITALS — BP 130/75 | HR 62 | Temp 98.9°F | Ht 65.0 in | Wt 220.2 lb

## 2020-09-09 DIAGNOSIS — G894 Chronic pain syndrome: Secondary | ICD-10-CM | POA: Diagnosis not present

## 2020-09-09 DIAGNOSIS — M961 Postlaminectomy syndrome, not elsewhere classified: Secondary | ICD-10-CM | POA: Diagnosis not present

## 2020-09-09 DIAGNOSIS — Z79891 Long term (current) use of opiate analgesic: Secondary | ICD-10-CM | POA: Insufficient documentation

## 2020-09-09 DIAGNOSIS — M546 Pain in thoracic spine: Secondary | ICD-10-CM | POA: Insufficient documentation

## 2020-09-09 DIAGNOSIS — M170 Bilateral primary osteoarthritis of knee: Secondary | ICD-10-CM | POA: Diagnosis not present

## 2020-09-09 DIAGNOSIS — Z5181 Encounter for therapeutic drug level monitoring: Secondary | ICD-10-CM | POA: Diagnosis not present

## 2020-09-09 DIAGNOSIS — G8929 Other chronic pain: Secondary | ICD-10-CM | POA: Insufficient documentation

## 2020-09-09 MED ORDER — MORPHINE SULFATE ER 15 MG PO TBCR: 15.0000 mg | EXTENDED_RELEASE_TABLET | Freq: Three times a day (TID) | ORAL | 0 refills | Status: DC

## 2020-09-09 NOTE — Progress Notes (Signed)
Subjective:    Patient ID: [name], female    DOB: Apr 11, 1951, 70 y.o.   MRN: [id number]  HPI: NAKHIA LEVITAN is a 70 y.o. female who returns for follow up appointment for chronic pain and medication refill. She states her pain is located in her mid- back and bilateral knee pain. She. rates her pain 4. Her  current exercise regime is walking and performing stretching exercises.  Ms. Enwright states she will be scheduled for foot surgery in a few weeks.she will send a My-Chart message with update.   Ms. Canino Morphine equivalent is 45.00  MME.   Last UDS was Performed on 06/11/2020, it was consistent.   Pain Inventory Average Pain 4 Pain Right Now 4 My pain is constant and aching  In the last 24 hours, has pain interfered with the following? General activity 5 Relation with others 4 Enjoyment of life 4 What TIME of day is your pain at its worst? evening and night Sleep (in general) Good  Pain is worse with: walking, bending, standing and some activites Pain improves with: rest, heat/ice, pacing activities, medication and wave tech therapy Relief from Meds: 5  Family History  Problem Relation Age of Onset  . Breast cancer Mother   . Diabetes Mother   . Heart disease Mother   . Allergies Mother   . Diabetes Father   . Heart disease Father   . Emphysema Father   . Allergies Father   . Stomach cancer Maternal Uncle   . Breast cancer Paternal Aunt   . Colon cancer Neg Hx    Social History   Socioeconomic History  . Marital status: Married    Spouse name: Not on file  . Number of children: Not on file  . Years of education: Not on file  . Highest education level: Not on file  Occupational History  . Occupation: Radiation protection practitioner  . Occupation: retired  Tobacco Use  . Smoking status: Never Smoker  . Smokeless tobacco: Never Used  Vaping Use  . Vaping Use: Never used  Substance and Sexual Activity  . Alcohol use: Yes    Alcohol/week: 1.0 standard drink    Types: 1  Glasses of wine per week    Comment: rare  . Drug use: Never  . Sexual activity: Not Currently  Other Topics Concern  . Not on file  Social History Narrative  . Not on file   Social Determinants of Health   Financial Resource Strain: Low Risk   . Difficulty of Paying Living Expenses: Not hard at all  Food Insecurity: No Food Insecurity  . Worried About Charity fundraiser in the Last Year: Never true  . Ran Out of Food in the Last Year: Never true  Transportation Needs: No Transportation Needs  . Lack of Transportation (Medical): No  . Lack of Transportation (Non-Medical): No  Physical Activity: Inactive  . Days of Exercise per Week: 0 days  . Minutes of Exercise per Session: 0 min  Stress: No Stress Concern Present  . Feeling of Stress : Not at all  Social Connections: Not on file   Past Surgical History:  Procedure Laterality Date  . [place] STUDY N/A 01/04/2016   Procedure: [place] STUDY;  Surgeon: Manus Gunning, MD;  Location: [facility] ENDOSCOPY;  Service: Gastroenterology;  Laterality: N/A;  . ABDOMINAL HYSTERECTOMY  1988  . ADENOIDECTOMY    . ANTERIOR CERVICAL DECOMP/DISCECTOMY FUSION  2009, 1993   first procedure  in ~ 1993/Dr Botero  . APPENDECTOMY  1988  . BACK SURGERY    . CARDIOVERSION N/A 04/03/2019   Procedure: CARDIOVERSION;  Surgeon: Dorothy Spark, MD;  Location: [facility] ENDOSCOPY;  Service: Cardiovascular;  Laterality: N/A;  . CHOLECYSTECTOMY  2005  . ESOPHAGEAL MANOMETRY N/A 01/04/2016   Procedure: ESOPHAGEAL MANOMETRY (EM) 24 HR PH;  Surgeon: Manus Gunning, MD;  Location: [facility] ENDOSCOPY;  Service: Gastroenterology;  Laterality: N/A;  . ESOPHAGOGASTRODUODENOSCOPY (EGD) WITH PROPOFOL N/A 05/17/2016   Procedure: ESOPHAGOGASTRODUODENOSCOPY (EGD) WITH PROPOFOL;  Surgeon: Manus Gunning, MD;  Location: [facility] ENDOSCOPY;  Service: Gastroenterology;  Laterality: N/A;  . NASAL SINUS SURGERY  2006  . SAVORY DILATION N/A 05/17/2016   Procedure:  SAVORY DILATION;  Surgeon: Manus Gunning, MD;  Location: [facility];  Service: Gastroenterology;  Laterality: N/A;  . TEE WITHOUT CARDIOVERSION N/A 04/03/2019   Procedure: TRANSESOPHAGEAL ECHOCARDIOGRAM (TEE)-with DCCV;  Surgeon: Dorothy Spark, MD;  Location: [facility] ENDOSCOPY;; EF 55 to 60%.  No LVH.  Normal RV.  Moderate LA dilation.  No thrombus.  Mild RA dilation.  Mild to moderate TR.  Mild aortic valve sclerosis, no stenosis.  Moderately elevated PA pressures (47 mmHg)  . TONSILLECTOMY AND ADENOIDECTOMY    . TRANSTHORACIC ECHOCARDIOGRAM  2009   Normal LV size and function. EF 60-65%. No regional wall motion and amount is. Mild aortic sclerosis, no stenosis  . TUBAL LIGATION  1979   Past Surgical History:  Procedure Laterality Date  . [place] STUDY N/A 01/04/2016   Procedure: [place] STUDY;  Surgeon: Manus Gunning, MD;  Location: [facility] ENDOSCOPY;  Service: Gastroenterology;  Laterality: N/A;  . ABDOMINAL HYSTERECTOMY  1988  . ADENOIDECTOMY    . ANTERIOR CERVICAL DECOMP/DISCECTOMY FUSION  2009, 1993   first procedure in ~ 1993/Dr Botero  . APPENDECTOMY  1988  . BACK SURGERY    . CARDIOVERSION N/A 04/03/2019   Procedure: CARDIOVERSION;  Surgeon: Dorothy Spark, MD;  Location: [facility] ENDOSCOPY;  Service: Cardiovascular;  Laterality: N/A;  . CHOLECYSTECTOMY  2005  . ESOPHAGEAL MANOMETRY N/A 01/04/2016   Procedure: ESOPHAGEAL MANOMETRY (EM) 24 HR PH;  Surgeon: Manus Gunning, MD;  Location: [facility] ENDOSCOPY;  Service: Gastroenterology;  Laterality: N/A;  . ESOPHAGOGASTRODUODENOSCOPY (EGD) WITH PROPOFOL N/A 05/17/2016   Procedure: ESOPHAGOGASTRODUODENOSCOPY (EGD) WITH PROPOFOL;  Surgeon: Manus Gunning, MD;  Location: [facility] ENDOSCOPY;  Service: Gastroenterology;  Laterality: N/A;  . NASAL SINUS SURGERY  2006  . SAVORY DILATION N/A 05/17/2016   Procedure: SAVORY DILATION;  Surgeon: Manus Gunning, MD;  Location: [facility] ENDOSCOPY;  Service: Gastroenterology;   Laterality: N/A;  . TEE WITHOUT CARDIOVERSION N/A 04/03/2019   Procedure: TRANSESOPHAGEAL ECHOCARDIOGRAM (TEE)-with DCCV;  Surgeon: Dorothy Spark, MD;  Location: [facility] ENDOSCOPY;; EF 55 to 60%.  No LVH.  Normal RV.  Moderate LA dilation.  No thrombus.  Mild RA dilation.  Mild to moderate TR.  Mild aortic valve sclerosis, no stenosis.  Moderately elevated PA pressures (47 mmHg)  . TONSILLECTOMY AND ADENOIDECTOMY    . TRANSTHORACIC ECHOCARDIOGRAM  2009   Normal LV size and function. EF 60-65%. No regional wall motion and amount is. Mild aortic sclerosis, no stenosis  . TUBAL LIGATION  1979   Past Medical History:  Diagnosis Date  . Allergy   . Anemia   . Arthritis   . B12 deficiency   . Complication of anesthesia    slow to wake up   . Depression   .  Diabetes mellitus type 2, diet-controlled ([place]) 07/24/2018  . Fibromyalgia   . Generalized hyperhidrosis 04/17/2014  . GERD (gastroesophageal reflux disease)   . History of rheumatic fever as a child   . Hyperlipemia   . HYPOTHYROIDISM 05/29/2007  . Irritable bowel syndrome   . Migraines    hx of   . OSA (obstructive sleep apnea) 06/26/2019  . Oxygen deficiency    2 L at bedtime  . PVC (premature ventricular contraction)   . Senile calcific aortic valve sclerosis    Aortic Sclerosis-no stenosis  . Unspecified essential hypertension    denies htn on 05/09/16   BP 130/75   Pulse 62   Temp 98.9 F (37.2 C)   Ht 5\' 5"  (1.651 m)   Wt 220 lb 3.2 oz (99.9 kg)   SpO2 97%   BMI 36.64 kg/m   Opioid Risk Score:   Fall Risk Score:  `1  Depression screen PHQ 2/9  Depression screen [facility] 2/9 08/12/2020 06/11/2020 02/12/2020 12/18/2019 06/19/2019 01/18/2019 08/08/2018  Decreased Interest 0 0 0 0 1 0 0  Down, Depressed, Hopeless 0 - 0 0 1 0 0  PHQ - 2 Score 0 0 0 0 2 0 0  Altered sleeping - - 0 0 - 0 -  Tired, decreased energy - - 0 0 - 3 -  Change in appetite - - 0 0 - 0 -  Feeling bad or failure about yourself  - - 0 0 - 0 -  Trouble  concentrating - - 0 0 - 0 -  Moving slowly or fidgety/restless - - 0 0 - 0 -  Suicidal thoughts - - 0 0 - 0 -  PHQ-9 Score - - 0 0 - 3 -  Difficult doing work/chores - - Not difficult at all - - Not difficult at all -  Some recent data might be hidden     Review of Systems  Musculoskeletal: Positive for back pain and gait problem.       Knee and ankle pain  All other systems reviewed and are negative.      Objective:   Physical Exam Vitals and nursing note reviewed.  Constitutional:      Appearance: Normal appearance.  Cardiovascular:     Rate and Rhythm: Normal rate and regular rhythm.     Pulses: Normal pulses.     Heart sounds: Normal heart sounds.  Pulmonary:     Effort: Pulmonary effort is normal.     Breath sounds: Normal breath sounds.  Musculoskeletal:     Cervical back: Normal range of motion and neck supple.     Comments: Normal Muscle Bulk and Muscle Testing Reveals:  Upper Extremities: Full ROM and Muscle Strength 5/5 Thoracic Paraspinal Tenderness: T-7-T-9  Lower Extremities: Full ROM and Muscle Strength 5/5 Arises from chair with ease Narrow Based  Gait   Skin:    General: Skin is warm and dry.  Neurological:     Mental Status: She is alert and oriented to person, place, and time.  Psychiatric:        Mood and Affect: Mood normal.        Behavior: Behavior normal.           Assessment & Plan:  1. Cervical postlaminectomy syndrome:/ Cervicalgia/ Cervical Radiculitis/with neck and shoulder pain/ Chronic Midline Thoracic Pain.Continue exercise routine and using heat therapy.09/09/2020 Continue:current medication regimen. Refilled:MS Contin 15 mg one tablet three times a day #90.  We will continue the opioid monitoring program,  this consists of regular clinic visits, examinations, urine drug screen, pill counts as well as use of [place] Controlled Substance Reporting system. A 12 month History has been reviewed on the [place]  Controlled Substance Reporting Systemon03/16/2022. 2. Fibromyalgia/ Neuropathy: Continuecurrent medication regimen withGabapentin, activity and exercise regime.09/09/2020. 3.BilateralKnee Pain/ Mild Degenerative Changes:Continuecurrent medication regimen withVoltaren Gel.09/09/2020. 4.BilateralAnkle Pain:No complaints today.Continue HEP as tolerated. Continueto monitor.09/09/2020. 5. Muscle Spasm: Continuecurrent medication regimen withRobaxin.Continue to Monitor.09/09/2020.. 6. Opioid Induced Constipation: No complaints Continue to Monitor.09/09/2020. 7. Midline Low Back Pain: Continue current medication regime, continue HEP as tolerated.09/09/2020. 8. Right Gluteal Medius Weakness and Atrophy: No complaints Today:Continue with Hip Abduction Exercisesas tolerated: Continue to Monitor.09/09/2020. 9. RightHip Pain/ Right Greater Trochanteric Tenderness:No complaints Today.Continue to Alternate Ice and Heat Therapy. Continue to Monitor.09/09/2020 10. Sacroiliac Joint Pain:No complaints today.S/PSacroiliac Injection on 08/24/2018 with good relief noted.09/09/2020 11.Lumbar Radiculitis:No complaints today.Continue Gabapentin.Continue HEP as tolerated. Continue current medication regimen. Continue to monitor.09/09/2020.  F/U in 1 month via My-Chart Video Visit due to foot surgery and non- weight bearing.

## 2020-09-11 ENCOUNTER — Other Ambulatory Visit: Payer: Self-pay

## 2020-09-14 ENCOUNTER — Other Ambulatory Visit: Payer: Self-pay

## 2020-09-14 ENCOUNTER — Ambulatory Visit (INDEPENDENT_AMBULATORY_CARE_PROVIDER_SITE_OTHER): Payer: Medicare Other

## 2020-09-14 ENCOUNTER — Encounter: Payer: Self-pay | Admitting: Family Medicine

## 2020-09-14 ENCOUNTER — Ambulatory Visit (INDEPENDENT_AMBULATORY_CARE_PROVIDER_SITE_OTHER): Payer: Medicare Other | Admitting: Family Medicine

## 2020-09-14 VITALS — BP 110/60 | HR 70 | Temp 98.5°F | Ht 64.65 in | Wt 221.5 lb

## 2020-09-14 DIAGNOSIS — E038 Other specified hypothyroidism: Secondary | ICD-10-CM

## 2020-09-14 DIAGNOSIS — I480 Paroxysmal atrial fibrillation: Secondary | ICD-10-CM

## 2020-09-14 DIAGNOSIS — E785 Hyperlipidemia, unspecified: Secondary | ICD-10-CM | POA: Diagnosis not present

## 2020-09-14 DIAGNOSIS — I10 Essential (primary) hypertension: Secondary | ICD-10-CM

## 2020-09-14 DIAGNOSIS — E119 Type 2 diabetes mellitus without complications: Secondary | ICD-10-CM

## 2020-09-14 DIAGNOSIS — M79672 Pain in left foot: Secondary | ICD-10-CM | POA: Diagnosis not present

## 2020-09-14 DIAGNOSIS — Z79899 Other long term (current) drug therapy: Secondary | ICD-10-CM | POA: Diagnosis not present

## 2020-09-14 DIAGNOSIS — Z01818 Encounter for other preprocedural examination: Secondary | ICD-10-CM

## 2020-09-14 DIAGNOSIS — G8929 Other chronic pain: Secondary | ICD-10-CM

## 2020-09-14 NOTE — Progress Notes (Signed)
Chronic Care Management Pharmacy Note  09/14/20 Name:  [name] MRN:  [id number] DOB:  February 23, 1951  Subjective: [name] is an 70 y.o. year old female who is a primary patient of Copland, Frederico Hamman, MD.  The CCM team was consulted for assistance with disease management and care coordination needs.    Engaged with patient by telephone for follow up visit in response to provider referral for pharmacy case management and/or care coordination services.   Consent to Services:  The patient was given information about Chronic Care Management services, agreed to services, and gave verbal consent prior to initiation of services.  Please see initial visit note for detailed documentation.   Patient Care Team: Owens Loffler, MD as PCP - General [name], MD as PCP - Cardiology (Cardiology) Letta Pate Luanna Salk, MD as Consulting Physician (Physical Medicine and Rehabilitation) Armbruster, Carlota Raspberry, MD as Consulting Physician (Gastroenterology) [name], MD as Consulting Physician (Cardiology) Chesley Mires, MD as Consulting Physician (Pulmonary Disease) Debbora Dus, [facility] as Pharmacist (Pharmacist)  Patient Concerns: Bunion foot surgery at end of the month, denies other health concerns  Recent office visits: 06/15/20 - PCP - Recent A1c increase to 8.2%, down today to 7.6%, continue lifestyle changes   Recent consult visits: 06/10/20 - Pulmonology -  BiPAP mask, refit  06/02/20 - Cardiology - AFIB, continue meds. Diltiazem 240 mg with Flecainide 50 mg BID (double dose for 2 days with breakthrough AFIB. Continue Eliquis. LDL goal < 100, recent LDL 108, she would like to avoid statin. If not @ goal by next year - would consider low dose rosuvastatin ~10 mg daily.  Hospital visits: None in previous 6 months  Objective:  Lab Results  Component Value Date   CREATININE 0.77 09/14/2020   BUN 15 09/14/2020   GFR 79.01 09/14/2020   GFRNONAA >60 08/29/2019   GFRAA >60  08/29/2019   NA 139 09/14/2020   K 4.3 09/14/2020   CALCIUM 9.5 09/14/2020   CO2 32 09/14/2020   GLUCOSE 213 (H) 09/14/2020    Lab Results  Component Value Date/Time   HGBA1C 7.7 (H) 09/14/2020 02:37 PM   HGBA1C 7.6 (A) 06/15/2020 03:20 PM   HGBA1C 8.2 (H) 03/12/2020 01:25 PM   GFR 79.01 09/14/2020 02:37 PM   GFR 79.20 03/12/2020 01:25 PM   MICROALBUR 5.5 (H) 03/12/2020 01:47 PM   MICROALBUR <0.7 01/18/2019 02:07 PM    Last diabetic Eye exam:  Lab Results  Component Value Date/Time   HMDIABEYEEXA No Retinopathy 07/02/2019 12:00 AM    Last diabetic Foot exam: 08/28/20  Podiatry   Lab Results  Component Value Date   CHOL 188 03/12/2020   HDL 50.90 03/12/2020   LDLCALC 108 (H) 03/12/2020   LDLDIRECT 125.0 01/18/2019   TRIG 142.0 03/12/2020   CHOLHDL 4 03/12/2020    Hepatic Function Latest Ref Rng & Units 09/14/2020 03/12/2020 08/01/2019  Total Protein 6.0 - 8.3 g/dL 7.0 7.1 6.9  Albumin 3.5 - 5.2 g/dL 4.5 4.3 4.1  AST 0 - 37 U/L 31 33 28  ALT 0 - 35 U/L 33 34 30  Alk Phosphatase 39 - 117 U/L 122(H) 105 123(H)  Total Bilirubin 0.2 - 1.2 mg/dL 0.4 0.4 0.3  Bilirubin, Direct 0.0 - 0.3 mg/dL 0.1 0.1 0.1    Lab Results  Component Value Date/Time   TSH 3.52 09/14/2020 02:37 PM   TSH 4.39 03/12/2020 01:25 PM   FREET4 0.94 09/14/2020 02:37 PM   FREET4 0.88 03/12/2020 01:25  PM    CBC Latest Ref Rng & Units 09/14/2020 03/12/2020 08/29/2019  WBC 4.0 - 10.5 K/uL 6.2 6.1 6.0  Hemoglobin 12.0 - 15.0 g/dL 12.5 12.1 13.0  Hematocrit 36.0 - 46.0 % 36.2 35.9(L) 39.1  Platelets 150.0 - 400.0 K/uL 264.0 236.0 262    Lab Results  Component Value Date/Time   VD25OH 33.52 03/12/2020 01:25 PM   VD25OH 32.97 08/01/2019 09:19 AM    Clinical ASCVD: No  The 10-year ASCVD risk score Mikey Bussing DC Jr., et al., 2013) is: 14.2%   Values used to calculate the score:     Age: 79 years     Sex: Female     Is Non-Hispanic African American: No     Diabetic: Yes     Tobacco smoker: No     Systolic  Blood Pressure: 110 mmHg     Is BP treated: Yes     HDL Cholesterol: 50.9 mg/dL     Total Cholesterol: 188 mg/dL    Depression screen [facility] 2/9 08/12/2020 06/11/2020 02/12/2020  Decreased Interest 0 0 0  Down, Depressed, Hopeless 0 - 0  PHQ - 2 Score 0 0 0  Altered sleeping - - 0  Tired, decreased energy - - 0  Change in appetite - - 0  Feeling bad or failure about yourself  - - 0  Trouble concentrating - - 0  Moving slowly or fidgety/restless - - 0  Suicidal thoughts - - 0  PHQ-9 Score - - 0  Difficult doing work/chores - - Not difficult at all  Some recent data might be hidden    Social History   Tobacco Use  Smoking Status Never Smoker  Smokeless Tobacco Never Used   BP Readings from Last 3 Encounters:  09/14/20 110/60  09/09/20 130/75  08/15/20 (!) 140/55   Pulse Readings from Last 3 Encounters:  09/14/20 70  09/09/20 62  08/15/20 69   Wt Readings from Last 3 Encounters:  09/14/20 221 lb 8 oz (100.5 kg)  09/09/20 220 lb 3.2 oz (99.9 kg)  08/12/20 223 lb 12.8 oz (101.5 kg)   BMI Readings from Last 3 Encounters:  09/14/20 37.26 kg/m  09/09/20 36.64 kg/m  08/12/20 37.24 kg/m    Assessment/Interventions: Review of patient past medical history, allergies, medications, health status, including review of consultants reports, laboratory and other test data, was performed as part of comprehensive evaluation and provision of chronic care management services.   SDOH:  (Social Determinants of Health) assessments and interventions performed: Yes SDOH Interventions   Flowsheet Row Most Recent Value  SDOH Interventions   Financial Strain Interventions Intervention Not Indicated  [Meds affordable]      CCM Care Plan  Allergies  Allergen Reactions  . Cyclobenzaprine Hcl Anaphylaxis and Swelling    Reaction - throat closed, tongue swelling  . Rizatriptan Benzoate Anaphylaxis    Throat and Tongue Swelling Closed  . Sumatriptan Swelling    Throat closed & Tongue  Swelling  . Tizanidine Shortness Of Breath  . Aleve [Naproxen Sodium] Hives  . Clarithromycin Hives  . Doxycycline Diarrhea and Nausea And Vomiting  . Duloxetine Nausea Only  . Egg [Eggs Or Egg-Derived Products] Diarrhea and Nausea And Vomiting  . Keflex [Cephalexin] Itching and Other (See Comments)    Sores  . Metronidazole Diarrhea and Nausea Only  . Oxycodone Hcl Diarrhea and Nausea And Vomiting  . Oxycontin [Oxycodone Hcl] Nausea And Vomiting  . Penicillins Other (See Comments)    Unknown reaction Did it  involve swelling of the face/tongue/throat, SOB, or low BP? Unknown Did it involve sudden or severe rash/hives, skin peeling, or any reaction on the inside of your mouth or nose? Unknown Did you need to seek medical attention at a hospital or doctor's office? Unknown When did it last happen?infant If all above answers are "NO", may proceed with cephalosporin use.  . Rosuvastatin Other (See Comments)    Muscle aches  . Venlafaxine Hives  . Voltaren [Diclofenac Sodium] Hives    Reaction to oral med, uses gel occasionally  . Septra [Sulfamethoxazole-Trimethoprim] Itching and Rash  . Tramadol Diarrhea, Nausea And Vomiting and Palpitations    Medications Reviewed Today    Reviewed by Debbora Dus, [facility] (Pharmacist) on 09/23/20 at 1610  Med List Status: <None>  Medication Order Taking? Sig Documenting Provider Last Dose Status Informant  acetaminophen (TYLENOL) 650 MG CR tablet [id number] Yes Take 650 mg by mouth in the morning and at bedtime. [provider] Taking Active Self  B Complex Vitamins (B COMPLEX 100 PO) [id number] Yes Take 1 tablet by mouth every morning.  [provider] Taking Active Self  bisacodyl (DULCOLAX) 5 MG EC tablet [id number] Yes Take 10 mg by mouth at bedtime. [provider] Taking Active Self  Calcium Carbonate-Vitamin D (CALCIUM 600+D PO) [id number] Yes Take 1 tablet by mouth in the morning and at bedtime.  [provider] Taking Active Self  carboxymethylcellulose (REFRESH PLUS) 0.5 % SOLN [id number] Yes Place 1 drop into both eyes 3 (three) times daily as needed (dry eyes).  [provider] Taking Active Self  cetirizine (ZYRTEC) 10 MG tablet [id number] Yes Take 10 mg by mouth at bedtime.  [provider] Taking Active Self  Cholecalciferol (VITAMIN D) 125 MCG (5000 UT) CAPS [id number] Yes Take 5,000 Units by mouth every morning.  [provider] Taking Active Self  clindamycin (CLEOCIN T) 1 % lotion [id number] Yes Apply 1 application topically daily as needed (eczema).  [provider] Taking Active Self  Coenzyme Q-10 100 MG capsule [id number] Yes Take 100 mg by mouth every morning.  [provider] Taking Active Self  diclofenac sodium (VOLTAREN) 1 % GEL [id number] Yes Apply 4 g topically 4 (four) times daily as needed (joint pain).  [provider] Taking Active Self  diltiazem (CARDIZEM CD) 120 MG 24 hr capsule [id number] Yes Take 1 capsule (120 mg total) by mouth at bedtime. Fenton, Svensen R, [place] Taking Active   diltiazem (CARDIZEM CD) 240 MG 24 hr capsule [id number] Yes TAKE 1 CAPSULE BY MOUTH EVERY DAY [name], MD Taking Active   docusate sodium (COLACE) 250 MG capsule [id number] Yes Take 250 mg by mouth at bedtime. [provider] Taking Active Self  ELIQUIS 5 MG TABS tablet [id number] Yes TAKE 1 TABLET BY MOUTH  TWICE DAILY [name], MD Taking Active   flecainide [facility]) 50 MG tablet [id number] Yes TAKE 1 TABLET BY MOUTH  TWICE DAILY [name], MD Taking Active   fluocinonide (LIDEX) 0.05 % external solution [id number] Yes Apply 1 application topically daily as needed (eczema on scalp).  [provider] Taking Active Self  gabapentin (NEURONTIN) 300 MG capsule [id number] Yes TAKE 1 CAPSULE BY MOUTH 3  TIMES DAILY Kirsteins, Luanna Salk, MD Taking Active   GRAPE SEED EXTRACT PO [id number] Yes Take 100 mg by mouth 2  (two) times daily.  [provider] Taking Active Self  levothyroxine (SYNTHROID) 50 MCG tablet [id number] Yes TAKE 1 TABLET  BY MOUTH  DAILY  Patient taking differently: Takes generic levothyroxine   Copland, Spencer, MD Taking Active Self  loratadine (CLARITIN) 10 MG tablet [id number] Yes Take 10 mg by mouth every morning.  [provider] Taking Active Self  Magnesium Bisglycinate (MAG GLYCINATE PO) [id number] Yes Take 400 mg by mouth at bedtime.  [provider] Taking Active Self  metFORMIN (GLUCOPHAGE) 500 MG tablet [id number] Yes Take 1 tablet (500 mg total) by mouth 2 (two) times daily with a meal. Copland, Spencer, MD Taking Active   methocarbamol (ROBAXIN) 500 MG tablet [id number] Yes Take 1 tablet (500 mg total) by mouth every 8 (eight) hours as needed for muscle spasms. Bayard Hugger, NP Taking Active   mirtazapine (REMERON) 15 MG tablet [id number] Yes TAKE 1 TABLET BY MOUTH AT  BEDTIME  Patient taking differently: 7.5 mg. Taking 1/2 (7.5 mg) qhs   Copland, Spencer, MD Taking Active Self  morphine (MS CONTIN) 15 MG 12 hr tablet [id number] Yes Take 1 tablet (15 mg total) by mouth 3 (three) times daily. Bayard Hugger, NP Taking Active   oxybutynin (DITROPAN-XL) 10 MG 24 hr tablet [id number] Yes Take 10 mg by mouth at bedtime. [provider] Taking Active   oxymetazoline (AFRIN) 0.05 % nasal spray [id number] No Place 1 spray into both nostrils 2 (two) times daily as needed for congestion. [provider] Not Taking Active   RABEprazole (ACIPHEX) 20 MG tablet [id number] Yes TAKE 1 TABLET BY MOUTH EVERY DAY Copland, Spencer, MD Taking Active   tretinoin (RETIN-A) 0.05 % cream [id number] Yes Apply 1 application topically daily as needed (acne).  [provider] Taking Active Self  vitamin B-12 (CYANOCOBALAMIN) 1000 MCG tablet [id number] Yes Take 1,000 mcg by mouth daily. [provider] Taking Active Self          Patient Active  Problem List   Diagnosis Date Noted  . Secondary hypercoagulable state ([place]) 08/29/2019  . OSA (obstructive sleep apnea) 06/26/2019  . Paroxysmal atrial fibrillation ([place]): CHA2D2DSVasc =4; Eliquis 05/10/2019  . Diabetes mellitus type 2, diet-controlled ([place]) 07/24/2018  . Obesity hypoventilation syndrome ([place]) 03/26/2015  . Generalized hyperhidrosis 04/17/2014  . Obesity (BMI 30-39.9) 08/23/2013  . 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 Postlaminectomy syndrome, cervical region 08/29/2011  . Depression, major, recurrent, in partial remission ([place]) 09/22/2008  . ALLERGIC RHINITIS 09/22/2008  . Hypothyroidism 05/29/2007  . Hyperlipidemia with target LDL less than 100 05/29/2007  . Vitamin D deficiency 01/11/2007  . FIBROMYALGIA 01/11/2007  . Vitamin B 12 deficiency 11/13/2006  . Essential hypertension 11/13/2006  . GERD 11/13/2006    Immunization History  Administered Date(s) Administered  . PFIZER(Purple Top)SARS-COV-2 Vaccination 08/22/2019, 09/18/2019  . Pneumococcal Conjugate-13 01/02/2017  . Pneumococcal Polysaccharide-23 05/06/2013, 07/25/2018  . Td 06/28/2003, 10/16/2013  . Zoster 11/07/2012    Conditions to be addressed/monitored:  Hypertension, Hyperlipidemia, Diabetes, Atrial Fibrillation, Hypothyroidism and Depression  Care Plan : [place]  Updates made by Debbora Dus, [facility] since 09/23/2020 12:00 AM    Problem: CHL AMB "PATIENT-SPECIFIC PROBLEM"     Long-Range Goal: [place]   Start Date: 09/14/2020  Priority: High  Note:    Current Barriers:  . None identified  Pharmacist Clinical Goal(s):  Marland Kitchen Patient will achieve control of diabetes and cholesterol as evidenced by A1c < 7.5% and LDL  < 100 through lifestyle changes.  Interventions: . 1:1 collaboration with Owens Loffler, MD regarding development and update of comprehensive  plan of care as evidenced by provider attestation and  co-signature . Inter-disciplinary care team collaboration (see longitudinal plan of care) . Comprehensive medication review performed; medication list updated in electronic medical record  Atrial Fibrillation (Goal: prevent stroke and major bleeding) -Current treatment: . Rate control: Diltiazem 240 mg - 1 capsule daily . Rhythm control: Flecainide 50 mg - 1 tablet BID (may take 2 BID x 2 days for AFIB episode) . Anticoagulation: Eliquis 5 mg - 1 tablet BID  -Home BP and HR readings: none, clinic BP stable/controlled -Counseled on increased risk of stroke due to Afib and benefits of anticoagulation for stroke prevention; importance of adherence to anticoagulant exactly as prescribed; -Recommended to continue current medication  Hyperlipidemia: (LDL goal < 100) -Not ideally controlled - LDL 108 (improved) -Current treatment: . No pharmacotherapy -Medications previously tried: Crestor - leg pains  -Her main concern is polypharmacy and prior leg pains on crestor -Discussed FH - Father passed at age 62 after multiple heart attacks. He smoked for 50+, poor diet, obese, etc. Mother MI in her 53s.  We discussed lowering her risk with weight loss, DM control, exercise. Her LDL has improved. -Educated on Cholesterol goals;  Benefits of statin for ASCVD risk reduction; -Recommended to continue current medication  Diabetes (A1c goal <7%) -Not ideally controlled - 7.6% (improved) -Current medications: . No pharmacotherapy  -Medications previously tried: metformin - BG dropped too low  -She has a tendency to have low sugars. Occasional hypoglycemia even without medication. Shakiness.  -Current home glucose readings - none -Denies hypoglycemic/hyperglycemic symptoms -Current meal patterns: tries to avoid snacking, eats vegetables and fruits, water and black coffee  -Current exercise: minimal, hopes to get out and walk after foot surgery -Educated on A1c and blood sugar goals; Exercise goal of  150 minutes per week; Benefits of weight loss; -Counseled to check feet daily and get yearly eye exams -Counseled on diet and exercise extensively  Other: Reports following med changes -  Takes 1/2 tablet mirtazapine (7.5 mg) instead of full dose every night. Started this several months ago. Doing fine on lower dose.   Patient Goals/Self-Care Activities . Patient will:  - target a minimum of 150 minutes of moderate intensity exercise weekly engage in dietary modifications by decreasing carbohydrates  Follow Up Plan: The care management team will reach out to the patient again over the next 90 days.  Next PCP appointment scheduled for:  September 2022  Medication Assistance: None required.  Patient affirms current coverage meets needs.      Patient's preferred pharmacy is: [facility], [address], [address] [place], [address] [place] [address] Phone: [phone] Fax: [phone]  Uses pill box? Yes Pt endorses good compliance  We discussed: Current pharmacy is preferred with insurance plan and patient is satisfied with pharmacy services Patient decided to: Continue current medication management strategy  [facility] mail order (refills most maintenance meds through mail order - Eliquis, gabapentin, thyroid, citalopram, mirtazapine, oxybutynin, flecainide)   Care Plan and Follow Up Patient Decision:  Patient agrees to Care Plan and Follow-up.  Debbora Dus, PharmD Clinical Pharmacist [facility] [phone]

## 2020-09-14 NOTE — Progress Notes (Signed)
T. , MD, [place] at [facility] [address] [place], [address]  Phone: [phone]  FAX: [phone]  [name] - 70 y.o. female  MRN [id number]  Date of Birth: February 18, 1951  Date: 09/14/2020  PCP: Owens Loffler, MD  Referral: Owens Loffler, MD  Chief Complaint  Patient presents with  . Surgical Clearance    This visit occurred during the SARS-CoV-2 public health emergency.  Safety protocols were in place, including screening questions prior to the visit, additional usage of staff PPE, and extensive cleaning of exam room while observing appropriate contact time as indicated for disinfecting solutions.   Subjective:   [name] is a 70 y.o. very pleasant female patient who presents with the following:  She is here for possible medical clearance for her foot surgery.  Dr. Amalia Hailey from Podiatry is planning left-sided hallux IPJ arthrodesis, excision of plantar fibroma, PIP joint arthroplasty with MTPJ capsulotomy, and Weil shortening osteotomy third digit left.  The patient has had recent bouts of A. Fib, but Dr. Ellyn Hack from Cardiology has been contacted, and he felt that cardiac risk was low for operative intervention.  She has had a thorough Cardiac assessment and management.   Body mass index is 37.26 kg/m.   After her prior foot surgery several years ago she had a complicated wound dehiscence with ulcer formation that required a very long-term wound management to heal.  At this point she is having ongoing daily pain.  After discussion with her podiatrist, the patient has indicated that she would like to move forward with operative management.  From a surgical wrist standpoint, the patient does have diabetes mellitus, and this is been relatively stable with diet management alone.  She also is a chronic pain patient and sees [facility].   Review of Systems is noted in the HPI, as appropriate  Patient Active Problem List   Diagnosis Date Noted  . Postlaminectomy syndrome, cervical region 08/29/2011    Priority: High  . FIBROMYALGIA 01/11/2007    Priority: High  . Depression, major, recurrent, in partial remission ([place]) 09/22/2008    Priority: Medium  . Hypothyroidism 05/29/2007    Priority: Medium  . Hyperlipidemia with target LDL less than 100 05/29/2007    Priority: Medium  . Essential hypertension 11/13/2006    Priority: Medium  . Secondary hypercoagulable state ([place]) 08/29/2019  . OSA (obstructive sleep apnea) 06/26/2019  . Paroxysmal atrial fibrillation ([place]): CHA2D2DSVasc =4; Eliquis 05/10/2019  . Diabetes mellitus type 2, diet-controlled ([place]) 07/24/2018  . Obesity hypoventilation syndrome ([place]) 03/26/2015  . Generalized hyperhidrosis 04/17/2014  . Obesity (BMI 30-39.9) 08/23/2013  . 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 ALLERGIC RHINITIS 09/22/2008  . Vitamin D deficiency 01/11/2007  . Vitamin B 12 deficiency 11/13/2006  . GERD 11/13/2006    Past Medical History:  Diagnosis Date  . B12 deficiency   . Complication of anesthesia    slow to wake up   . Depression   . Diabetes mellitus type 2, diet-controlled ([place]) 07/24/2018  . Fibromyalgia   . Generalized hyperhidrosis 04/17/2014  . GERD (gastroesophageal reflux disease)   . History of rheumatic fever as a child   . Hyperlipemia   . Hypertension    denies htn on 05/09/16  . HYPOTHYROIDISM 05/29/2007  . Irritable bowel syndrome   . Migraines    hx of   . OSA (  obstructive sleep apnea) 06/26/2019  . Senile calcific aortic valve sclerosis    Aortic Sclerosis-no stenosis    Past Surgical History:  Procedure Laterality Date  . [place] STUDY N/A 01/04/2016   Procedure: [place] STUDY;  Surgeon: Manus Gunning, MD;  Location: [facility] ENDOSCOPY;  Service: Gastroenterology;  Laterality:  N/A;  . ABDOMINAL HYSTERECTOMY  1988  . ADENOIDECTOMY    . ANTERIOR CERVICAL DECOMP/DISCECTOMY FUSION  2009, 1993   first procedure in ~ 1993/Dr Botero  . APPENDECTOMY  1988  . BACK SURGERY    . CARDIOVERSION N/A 04/03/2019   Procedure: CARDIOVERSION;  Surgeon: Dorothy Spark, MD;  Location: [facility] ENDOSCOPY;  Service: Cardiovascular;  Laterality: N/A;  . CHOLECYSTECTOMY  2005  . ESOPHAGEAL MANOMETRY N/A 01/04/2016   Procedure: ESOPHAGEAL MANOMETRY (EM) 24 HR PH;  Surgeon: Manus Gunning, MD;  Location: [facility] ENDOSCOPY;  Service: Gastroenterology;  Laterality: N/A;  . ESOPHAGOGASTRODUODENOSCOPY (EGD) WITH PROPOFOL N/A 05/17/2016   Procedure: ESOPHAGOGASTRODUODENOSCOPY (EGD) WITH PROPOFOL;  Surgeon: Manus Gunning, MD;  Location: [facility] ENDOSCOPY;  Service: Gastroenterology;  Laterality: N/A;  . NASAL SINUS SURGERY  2006  . SAVORY DILATION N/A 05/17/2016   Procedure: SAVORY DILATION;  Surgeon: Manus Gunning, MD;  Location: [facility] ENDOSCOPY;  Service: Gastroenterology;  Laterality: N/A;  . TEE WITHOUT CARDIOVERSION N/A 04/03/2019   Procedure: TRANSESOPHAGEAL ECHOCARDIOGRAM (TEE)-with DCCV;  Surgeon: Dorothy Spark, MD;  Location: [facility] ENDOSCOPY;; EF 55 to 60%.  No LVH.  Normal RV.  Moderate LA dilation.  No thrombus.  Mild RA dilation.  Mild to moderate TR.  Mild aortic valve sclerosis, no stenosis.  Moderately elevated PA pressures (47 mmHg)  . TONSILLECTOMY AND ADENOIDECTOMY    . TRANSTHORACIC ECHOCARDIOGRAM  2009   Normal LV size and function. EF 60-65%. No regional wall motion and amount is. Mild aortic sclerosis, no stenosis  . TUBAL LIGATION  1979    Family History  Problem Relation Age of Onset  . Breast cancer Mother   . Diabetes Mother   . Heart disease Mother   . Allergies Mother   . Diabetes Father   . Heart disease Father   . Emphysema Father   . Allergies Father   . Stomach cancer Maternal Uncle   . Breast cancer Paternal Aunt   . Colon cancer Neg Hx       Objective:   BP 110/60   Pulse 70   Temp 98.5 F (36.9 C) (Temporal)   Ht 5' 4.65" (1.642 m)   Wt 221 lb 8 oz (100.5 kg)   SpO2 97%   BMI 37.26 kg/m   GEN: WDWN, NAD, Non-toxic HEENT: Atraumatic, Normocephalic. Neck supple. No masses. CV: RRR, No M/G/R. No JVD. No thrill. No extra heart sounds. PULM: CTA B, no wheezes, crackles, rhonchi. No retractions. No resp. distress. No accessory muscle use. EXTR: No c/c/e NEURO Normal gait.  PSYCH: Normally interactive. Conversant.   Laboratory and Imaging Data: Results for orders placed or performed in visit on 09/14/20  Hemoglobin A1c  Result Value Ref Range   Hgb A1c MFr Bld 7.7 (H) 4.6 - 6.5 %  Basic metabolic panel  Result Value Ref Range   Sodium 139 135 - 145 mEq/L   Potassium 4.3 3.5 - 5.1 mEq/L   Chloride 99 96 - 112 mEq/L   CO2 32 19 - 32 mEq/L   Glucose, Bld 213 (H) 70 - 99 mg/dL   BUN 15 6 - 23 mg/dL   Creatinine,  Ser 0.77 0.40 - 1.20 mg/dL   GFR 79.01 >60.00 mL/min   Calcium 9.5 8.4 - 10.5 mg/dL  CBC with Differential/Platelet  Result Value Ref Range   WBC 6.2 4.0 - 10.5 K/uL   RBC 4.12 3.87 - 5.11 Mil/uL   Hemoglobin 12.5 12.0 - 15.0 g/dL   HCT 36.2 36.0 - 46.0 %   MCV 87.9 78.0 - 100.0 fl   MCHC 34.6 30.0 - 36.0 g/dL   RDW 12.6 11.5 - 15.5 %   Platelets 264.0 150.0 - 400.0 K/uL   Neutrophils Relative % 59.3 43.0 - 77.0 %   Lymphocytes Relative 33.2 12.0 - 46.0 %   Monocytes Relative 5.4 3.0 - 12.0 %   Eosinophils Relative 1.6 0.0 - 5.0 %   Basophils Relative 0.5 0.0 - 3.0 %   Neutro Abs 3.7 1.4 - 7.7 K/uL   Lymphs Abs 2.0 0.7 - 4.0 K/uL   Monocytes Absolute 0.3 0.1 - 1.0 K/uL   Eosinophils Absolute 0.1 0.0 - 0.7 K/uL   Basophils Absolute 0.0 0.0 - 0.1 K/uL  Hepatic function panel  Result Value Ref Range   Total Bilirubin 0.4 0.2 - 1.2 mg/dL   Bilirubin, Direct 0.1 0.0 - 0.3 mg/dL   Alkaline Phosphatase 122 (H) 39 - 117 U/L   AST 31 0 - 37 U/L   ALT 33 0 - 35 U/L   Total Protein 7.0 6.0 - 8.3 g/dL    Albumin 4.5 3.5 - 5.2 g/dL  T3, free  Result Value Ref Range   T3, Free 4.3 (H) 2.3 - 4.2 pg/mL  T4, free  Result Value Ref Range   Free T4 0.94 0.60 - 1.60 ng/dL  TSH  Result Value Ref Range   TSH 3.52 0.35 - 4.50 uIU/mL     Assessment and Plan:     ICD-10-CM   1. Pre-operative clearance  Z01.818 Hemoglobin Y1P    Basic metabolic panel    CBC with Differential/Platelet    Hepatic function panel    T3, free    T4, free    TSH  2. Other specified hypothyroidism  E03.8 T3, free    T4, free    TSH  3. Diabetes mellitus type 2, diet-controlled (HCC)  E11.9 Hemoglobin A1c  4. Essential hypertension  I10   5. Paroxysmal atrial fibrillation ([place]): CHA2D2DSVasc =4; Eliquis  I48.0   6. Encounter for long-term (current) use of medications  J09.326 Basic metabolic panel    CBC with Differential/Platelet    Hepatic function panel  7. Chronic foot pain, left  M79.672    G89.29    I will entirely defer cardiac management to her cardiologist, Dr. Ellyn Hack.  At this point, the patient's blood sugar is elevated with a fasting blood sugar of 213, and her hemoglobin A1c is 7.7.  This represents increased risk to the patient postoperatively.  We will initiate oral therapy to Metformin 500 mg BID with Accu-Chek, repeat A1c, and follow-up with me in 3 months.  At this point, the patient should not be considered medically cleared for non-emergent surgery until her diabetes is under better control.  I am going to send a copy of this note to her Podiatrist, Dr. Amalia Hailey.  Orders Placed This Encounter  Procedures  . Hemoglobin A1c  . Basic metabolic panel  . CBC with Differential/Platelet  . Hepatic function panel  . T3, free  . T4, free  . TSH    Follow-up: No follow-ups on file.  Signed,  Frederico Hamman  Celedonio Savage, MD   Outpatient Encounter Medications as of 09/14/2020  Medication Sig  . acetaminophen (TYLENOL) 650 MG CR tablet Take 650 mg by mouth in the morning and at bedtime.  . B  Complex Vitamins (B COMPLEX 100 PO) Take 1 tablet by mouth every morning.   . bisacodyl (DULCOLAX) 5 MG EC tablet Take 10 mg by mouth at bedtime.  . Calcium Carbonate-Vitamin D (CALCIUM 600+D PO) Take 1 tablet by mouth in the morning and at bedtime.   . carboxymethylcellulose (REFRESH PLUS) 0.5 % SOLN Place 1 drop into both eyes 3 (three) times daily as needed (dry eyes).   . cetirizine (ZYRTEC) 10 MG tablet Take 10 mg by mouth at bedtime.   . Cholecalciferol (VITAMIN D) 125 MCG (5000 UT) CAPS Take 5,000 Units by mouth every morning.   . clindamycin (CLEOCIN T) 1 % lotion Apply 1 application topically daily as needed (eczema).   . Coenzyme Q-10 100 MG capsule Take 100 mg by mouth every morning.   . diclofenac sodium (VOLTAREN) 1 % GEL Apply 4 g topically 4 (four) times daily as needed (joint pain).   Marland Kitchen diltiazem (CARDIZEM CD) 240 MG 24 hr capsule TAKE 1 CAPSULE BY MOUTH EVERY DAY  . docusate sodium (COLACE) 250 MG capsule Take 250 mg by mouth at bedtime.  Marland Kitchen ELIQUIS 5 MG TABS tablet TAKE 1 TABLET BY MOUTH  TWICE DAILY  . flecainide (TAMBOCOR) 50 MG tablet TAKE 1 TABLET BY MOUTH  TWICE DAILY  . fluocinonide (LIDEX) 0.05 % external solution Apply 1 application topically daily as needed (eczema on scalp).   . gabapentin (NEURONTIN) 300 MG capsule TAKE 1 CAPSULE BY MOUTH 3  TIMES DAILY  . GRAPE SEED EXTRACT PO Take 100 mg by mouth 2 (two) times daily.   Marland Kitchen levothyroxine (SYNTHROID) 50 MCG tablet TAKE 1 TABLET BY MOUTH  DAILY (Patient taking differently: Takes generic levothyroxine)  . loratadine (CLARITIN) 10 MG tablet Take 10 mg by mouth every morning.   . Magnesium Bisglycinate (MAG GLYCINATE PO) Take 400 mg by mouth at bedtime.   . methocarbamol (ROBAXIN) 500 MG tablet Take 1 tablet (500 mg total) by mouth every 8 (eight) hours as needed for muscle spasms.  . mirtazapine (REMERON) 15 MG tablet TAKE 1 TABLET BY MOUTH AT  BEDTIME  . morphine (MS CONTIN) 15 MG 12 hr tablet Take 1 tablet (15 mg total)  by mouth 3 (three) times daily.  Marland Kitchen oxybutynin (DITROPAN-XL) 10 MG 24 hr tablet Take 10 mg by mouth at bedtime.  Marland Kitchen oxymetazoline (AFRIN) 0.05 % nasal spray Place 1 spray into both nostrils 2 (two) times daily as needed for congestion.  . RABEprazole (ACIPHEX) 20 MG tablet TAKE 1 TABLET BY MOUTH EVERY DAY  . tretinoin (RETIN-A) 0.05 % cream Apply 1 application topically daily as needed (acne).   . vitamin B-12 (CYANOCOBALAMIN) 1000 MCG tablet Take 1,000 mcg by mouth daily.   No facility-administered encounter medications on file as of 09/14/2020.

## 2020-09-15 LAB — CBC WITH DIFFERENTIAL/PLATELET
Basophils Absolute: 0 10*3/uL (ref 0.0–0.1)
Basophils Relative: 0.5 % (ref 0.0–3.0)
Eosinophils Absolute: 0.1 10*3/uL (ref 0.0–0.7)
Eosinophils Relative: 1.6 % (ref 0.0–5.0)
HCT: 36.2 % (ref 36.0–46.0)
Hemoglobin: 12.5 g/dL (ref 12.0–15.0)
Lymphocytes Relative: 33.2 % (ref 12.0–46.0)
Lymphs Abs: 2 10*3/uL (ref 0.7–4.0)
MCHC: 34.6 g/dL (ref 30.0–36.0)
MCV: 87.9 fl (ref 78.0–100.0)
Monocytes Absolute: 0.3 10*3/uL (ref 0.1–1.0)
Monocytes Relative: 5.4 % (ref 3.0–12.0)
Neutro Abs: 3.7 10*3/uL (ref 1.4–7.7)
Neutrophils Relative %: 59.3 % (ref 43.0–77.0)
Platelets: 264 10*3/uL (ref 150.0–400.0)
RBC: 4.12 Mil/uL (ref 3.87–5.11)
RDW: 12.6 % (ref 11.5–15.5)
WBC: 6.2 10*3/uL (ref 4.0–10.5)

## 2020-09-15 LAB — HEMOGLOBIN A1C: Hgb A1c MFr Bld: 7.7 % — ABNORMAL HIGH (ref 4.6–6.5)

## 2020-09-15 LAB — HEPATIC FUNCTION PANEL
ALT: 33 U/L (ref 0–35)
AST: 31 U/L (ref 0–37)
Albumin: 4.5 g/dL (ref 3.5–5.2)
Alkaline Phosphatase: 122 U/L — ABNORMAL HIGH (ref 39–117)
Bilirubin, Direct: 0.1 mg/dL (ref 0.0–0.3)
Total Bilirubin: 0.4 mg/dL (ref 0.2–1.2)
Total Protein: 7 g/dL (ref 6.0–8.3)

## 2020-09-15 LAB — BASIC METABOLIC PANEL
BUN: 15 mg/dL (ref 6–23)
CO2: 32 mEq/L (ref 19–32)
Calcium: 9.5 mg/dL (ref 8.4–10.5)
Chloride: 99 mEq/L (ref 96–112)
Creatinine, Ser: 0.77 mg/dL (ref 0.40–1.20)
GFR: 79.01 mL/min (ref >60.00)
Glucose, Bld: 213 mg/dL — ABNORMAL HIGH (ref 70–99)
Potassium: 4.3 mEq/L (ref 3.5–5.1)
Sodium: 139 mEq/L (ref 135–145)

## 2020-09-15 LAB — T4, FREE: Free T4: 0.94 ng/dL (ref 0.60–1.60)

## 2020-09-15 LAB — T3, FREE: T3, Free: 4.3 pg/mL — ABNORMAL HIGH (ref 2.3–4.2)

## 2020-09-15 LAB — TSH: TSH: 3.52 u[IU]/mL (ref 0.35–4.50)

## 2020-09-15 MED ORDER — METFORMIN HCL 500 MG PO TABS: 500.0000 mg | ORAL_TABLET | Freq: Two times a day (BID) | ORAL | 3 refills | Status: DC

## 2020-09-21 ENCOUNTER — Other Ambulatory Visit (HOSPITAL_COMMUNITY): Payer: Self-pay | Admitting: Physician Assistant

## 2020-09-21 ENCOUNTER — Encounter: Payer: Self-pay | Admitting: Podiatry

## 2020-09-23 NOTE — Progress Notes (Signed)
I have personally reviewed this encounter including the documentation in this note and have collaborated with the care management provider regarding care management and care coordination activities to include development and update of the comprehensive care plan. I am certifying that I agree with the content of this note and encounter as supervising physician.    

## 2020-09-23 NOTE — Patient Instructions (Signed)
Dear [name],  Below is a summary of the goals we discussed during our follow up appointment on September 14, 2020. Please contact me anytime with questions or concerns.   Visit Information   Patient Care Plan: CCM Pharmacy Care Plan    Problem Identified: CHL AMB "PATIENT-SPECIFIC PROBLEM"     Long-Range Goal: [name]   Start Date: 09/14/2020  Priority: High  Note:    Current Barriers:  . None identified  Pharmacist Clinical Goal(s):  Marland Kitchen Patient will achieve control of diabetes and cholesterol as evidenced by A1c < 7.5% and LDL  < 100 through lifestyle changes.  Interventions: . 1:1 collaboration with Alicia Loffler, MD regarding development and update of comprehensive plan of care as evidenced by provider attestation and co-signature . Inter-disciplinary care team collaboration (see longitudinal plan of care) . Comprehensive medication review performed; medication list updated in electronic medical record  Atrial Fibrillation (Goal: prevent stroke and major bleeding) -Current treatment: . Rate control: Diltiazem 240 mg - 1 capsule daily . Rhythm control: Flecainide 50 mg - 1 tablet BID (may take 2 BID x 2 days for AFIB episode) . Anticoagulation: Eliquis 5 mg - 1 tablet BID  -Home BP and HR readings: none, clinic BP stable/controlled -Counseled on increased risk of stroke due to Afib and benefits of anticoagulation for stroke prevention; importance of adherence to anticoagulant exactly as prescribed; -Recommended to continue current medication  Hyperlipidemia: (LDL goal < 100) -Not ideally controlled - LDL 108 (improved) -Current treatment: . No pharmacotherapy -Medications previously tried: Crestor - leg pains  -Her main concern is polypharmacy and prior leg pains on crestor -Discussed FH - Father passed at age 71 after multiple heart attacks. He smoked for 50+, poor diet, obese, etc. Mother MI in her 68s.  We discussed lowering her risk with weight  loss, DM control, exercise. Her LDL has improved. -Educated on Cholesterol goals;  Benefits of statin for ASCVD risk reduction; -Recommended to continue current medication  Diabetes (A1c goal <7%) -Not ideally controlled - 7.6% (improved) -Current medications: . No pharmacotherapy  -Medications previously tried: metformin - BG dropped too low  -She has a tendency to have low sugars. Occasional hypoglycemia even without medication. Shakiness.  -Current home glucose readings - none -Denies hypoglycemic/hyperglycemic symptoms -Current meal patterns: tries to avoid snacking, eats vegetables and fruits, water and black coffee  -Current exercise: minimal, hopes to get out and walk after foot surgery -Educated on A1c and blood sugar goals; Exercise goal of 150 minutes per week; Benefits of weight loss; -Counseled to check feet daily and get yearly eye exams -Counseled on diet and exercise extensively  Other: Reports following med changes -  Takes 1/2 tablet mirtazapine (7.5 mg) instead of full dose every night. Started this several months ago. Doing fine on lower dose.   Patient Goals/Self-Care Activities . Patient will:  - target a minimum of 150 minutes of moderate intensity exercise weekly engage in dietary modifications by decreasing carbohydrates  Follow Up Plan: The care management team will reach out to the patient again over the next 90 days.  Next PCP appointment scheduled for:  September 2022  Medication Assistance: None required.  Patient affirms current coverage meets needs.      Patient verbalizes understanding of instructions provided today and agrees to view in [facility].  The pharmacy team will reach out to the patient again over the next 90 days.   [name], PharmD Clinical Pharmacist [name]  [place] [phone]

## 2020-09-29 ENCOUNTER — Other Ambulatory Visit: Payer: Self-pay | Admitting: Family Medicine

## 2020-10-02 ENCOUNTER — Encounter: Payer: Medicare Other | Admitting: Podiatry

## 2020-10-09 DIAGNOSIS — J9610 Chronic respiratory failure, unspecified whether with hypoxia or hypercapnia: Secondary | ICD-10-CM | POA: Diagnosis not present

## 2020-10-09 DIAGNOSIS — G4733 Obstructive sleep apnea (adult) (pediatric): Secondary | ICD-10-CM | POA: Diagnosis not present

## 2020-10-13 ENCOUNTER — Encounter: Payer: Medicare Other | Admitting: Podiatry

## 2020-10-14 ENCOUNTER — Other Ambulatory Visit: Payer: Self-pay

## 2020-10-14 ENCOUNTER — Encounter: Payer: Self-pay | Admitting: Registered Nurse

## 2020-10-14 ENCOUNTER — Encounter: Payer: Medicare Other | Attending: Physical Medicine & Rehabilitation | Admitting: Registered Nurse

## 2020-10-14 VITALS — BP 120/73 | HR 63 | Temp 98.9°F | Ht 65.0 in | Wt 217.0 lb

## 2020-10-14 DIAGNOSIS — G894 Chronic pain syndrome: Secondary | ICD-10-CM | POA: Diagnosis not present

## 2020-10-14 DIAGNOSIS — Z79891 Long term (current) use of opiate analgesic: Secondary | ICD-10-CM

## 2020-10-14 DIAGNOSIS — Z5181 Encounter for therapeutic drug level monitoring: Secondary | ICD-10-CM | POA: Diagnosis not present

## 2020-10-14 DIAGNOSIS — M62830 Muscle spasm of back: Secondary | ICD-10-CM | POA: Diagnosis not present

## 2020-10-14 DIAGNOSIS — G8929 Other chronic pain: Secondary | ICD-10-CM | POA: Diagnosis not present

## 2020-10-14 DIAGNOSIS — M546 Pain in thoracic spine: Secondary | ICD-10-CM

## 2020-10-14 MED ORDER — MORPHINE SULFATE ER 15 MG PO TBCR: 15.0000 mg | EXTENDED_RELEASE_TABLET | Freq: Three times a day (TID) | ORAL | 0 refills | Status: DC

## 2020-10-14 NOTE — Progress Notes (Signed)
Subjective:    Patient ID: [name], female    DOB: 07-22-1950, 70 y.o.   MRN: [id number]  HPI: [name] is a 70 y.o. female who returns for follow up appointment for chronic pain and medication refill. She states her pain is located in her mid- back, also reports increase intensity of right thoracic pain . She states she is only receiving 4- 5 hours of relief of her pain. We will have her scheduled with Dr Letta Pate to evaluate for injection, she states she had an injection in the past in 2013. Also reports increase frequency of muscle spasm, we will increase her Robaxin to 4 times a day as needed for pain.  She rates her pain 5. Her current exercise regime is walking and performing stretching exercises.  [name] Morphine equivalent is 45.00 MME.  UDS ordered today.    Pain Inventory Average Pain 7 Pain Right Now 5 My pain is constant, sharp, burning, stabbing and aching  In the last 24 hours, has pain interfered with the following? General activity 7 Relation with others 7 Enjoyment of life 7 What TIME of day is your pain at its worst? evening and night Sleep (in general) Good  Pain is worse with: walking, bending and standing Pain improves with: rest, heat/ice and medication Relief from Meds: 3  Family History  Problem Relation Age of Onset  . Breast cancer Mother   . Diabetes Mother   . Heart disease Mother   . Allergies Mother   . Diabetes Father   . Heart disease Father   . Emphysema Father   . Allergies Father   . Stomach cancer Maternal Uncle   . Breast cancer Paternal Aunt   . Colon cancer Neg Hx    Social History   Socioeconomic History  . Marital status: Married    Spouse name: Not on file  . Number of children: Not on file  . Years of education: Not on file  . Highest education level: Not on file  Occupational History  . Occupation: Radiation protection practitioner  . Occupation: retired  Tobacco Use  . Smoking status: Never Smoker  . Smokeless tobacco: Never  Used  Vaping Use  . Vaping Use: Never used  Substance and Sexual Activity  . Alcohol use: Yes    Alcohol/week: 1.0 standard drink    Types: 1 Glasses of wine per week    Comment: rare  . Drug use: Never  . Sexual activity: Not Currently  Other Topics Concern  . Not on file  Social History Narrative  . Not on file   Social Determinants of Health   Financial Resource Strain: Low Risk   . Difficulty of Paying Living Expenses: Not very hard  Food Insecurity: No Food Insecurity  . Worried About Charity fundraiser in the Last Year: Never true  . Ran Out of Food in the Last Year: Never true  Transportation Needs: No Transportation Needs  . Lack of Transportation (Medical): No  . Lack of Transportation (Non-Medical): No  Physical Activity: Inactive  . Days of Exercise per Week: 0 days  . Minutes of Exercise per Session: 0 min  Stress: No Stress Concern Present  . Feeling of Stress : Not at all  Social Connections: Not on file   Past Surgical History:  Procedure Laterality Date  . [place] STUDY N/A 01/04/2016   Procedure: [place] STUDY;  Surgeon: Manus Gunning, MD;  Location: [facility] ENDOSCOPY;  Service:  Gastroenterology;  Laterality: N/A;  . ABDOMINAL HYSTERECTOMY  1988  . ADENOIDECTOMY    . ANTERIOR CERVICAL DECOMP/DISCECTOMY FUSION  2009, 1993   first procedure in ~ 1993/Dr Botero  . APPENDECTOMY  1988  . BACK SURGERY    . CARDIOVERSION N/A 04/03/2019   Procedure: CARDIOVERSION;  Surgeon: Dorothy Spark, MD;  Location: [facility] ENDOSCOPY;  Service: Cardiovascular;  Laterality: N/A;  . CHOLECYSTECTOMY  2005  . ESOPHAGEAL MANOMETRY N/A 01/04/2016   Procedure: ESOPHAGEAL MANOMETRY (EM) 24 HR PH;  Surgeon: Manus Gunning, MD;  Location: [facility]OS[facility];  Service: Gastroenterology;  Laterality: N/A;  . ESOPHAGOGASTRODUODENOSCOPY (EGD) WITH PROPOFOL N/A 05/17/2016   Procedure: ESOPHAGOGASTRODUODENOSCOPY (EGD) WITH PROPOFOL;  Surgeon: Manus Gunning, MD;   Location: [facility];  Service: Gastroenterology;  Laterality: N/A;  . NASAL SINUS SURGERY  2006  . SAVORY DILATION N/A 05/17/2016   Procedure: SAVORY DILATION;  Surgeon: Manus Gunning, MD;  Location: [facility];  Service: Gastroenterology;  Laterality: N/A;  . TEE WITHOUT CARDIOVERSION N/A 04/03/2019   Procedure: TRANSESOPHAGEAL ECHOCARDIOGRAM (TEE)-with DCCV;  Surgeon: Dorothy Spark, MD;  Location: [facility] ENDOSCOPY;; EF 55 to 60%.  No LVH.  Normal RV.  Moderate LA dilation.  No thrombus.  Mild RA dilation.  Mild to moderate TR.  Mild aortic valve sclerosis, no stenosis.  Moderately elevated PA pressures (47 mmHg)  . TONSILLECTOMY AND ADENOIDECTOMY    . TRANSTHORACIC ECHOCARDIOGRAM  2009   Normal LV size and function. EF 60-65%. No regional wall motion and amount is. Mild aortic sclerosis, no stenosis  . TUBAL LIGATION  1979   Past Surgical History:  Procedure Laterality Date  . [place] STUDY N/A 01/04/2016   Procedure: [place] STUDY;  Surgeon: Manus Gunning, MD;  Location: [facility] ENDOSCOPY;  Service: Gastroenterology;  Laterality: N/A;  . ABDOMINAL HYSTERECTOMY  1988  . ADENOIDECTOMY    . ANTERIOR CERVICAL DECOMP/DISCECTOMY FUSION  2009, 1993   first procedure in ~ 1993/Dr Botero  . APPENDECTOMY  1988  . BACK SURGERY    . CARDIOVERSION N/A 04/03/2019   Procedure: CARDIOVERSION;  Surgeon: Dorothy Spark, MD;  Location: [facility] ENDOSCOPY;  Service: Cardiovascular;  Laterality: N/A;  . CHOLECYSTECTOMY  2005  . ESOPHAGEAL MANOMETRY N/A 01/04/2016   Procedure: ESOPHAGEAL MANOMETRY (EM) 24 HR PH;  Surgeon: Manus Gunning, MD;  Location: [facility] ENDOSCOPY;  Service: Gastroenterology;  Laterality: N/A;  . ESOPHAGOGASTRODUODENOSCOPY (EGD) WITH PROPOFOL N/A 05/17/2016   Procedure: ESOPHAGOGASTRODUODENOSCOPY (EGD) WITH PROPOFOL;  Surgeon: Manus Gunning, MD;  Location: [facility] ENDOSCOPY;  Service: Gastroenterology;  Laterality: N/A;  . NASAL SINUS SURGERY  2006  . [place]  DILATION N/A 05/17/2016   Procedure: SAVORY DILATION;  Surgeon: Manus Gunning, MD;  Location: [facility] ENDOS[facility];  Service: Gastroenterology;  Laterality: N/A;  . TEE WITHOUT CARDIOVERSION N/A 04/03/2019   Procedure: TRANSESOPHAGEAL ECHOCARDIOGRAM (TEE)-with DCCV;  Surgeon: Dorothy Spark, MD;  Location: [facility] ENDOSCOPY;; EF 55 to 60%.  No LVH.  Normal RV.  Moderate LA dilation.  No thrombus.  Mild RA dilation.  Mild to moderate TR.  Mild aortic valve sclerosis, no stenosis.  Moderately elevated PA pressures (47 mmHg)  . TONSILLECTOMY AND ADENOIDECTOMY    . TRANSTHORACIC ECHOCARDIOGRAM  2009   Normal LV size and function. EF 60-65%. No regional wall motion and amount is. Mild aortic sclerosis, no stenosis  . TUBAL LIGATION  1979   Past Medical History:  Diagnosis Date  . B12 deficiency   . Complication  of anesthesia    slow to wake up   . Depression   . Diabetes mellitus type 2, diet-controlled ([place]) 07/24/2018  . Fibromyalgia   . Generalized hyperhidrosis 04/17/2014  . GERD (gastroesophageal reflux disease)   . History of rheumatic fever as a child   . Hyperlipemia   . Hypertension    denies htn on 05/09/16  . HYPOTHYROIDISM 05/29/2007  . Irritable bowel syndrome   . Migraines    hx of   . OSA (obstructive sleep apnea) 06/26/2019  . Senile calcific aortic valve sclerosis    Aortic Sclerosis-no stenosis   BP 120/73   Pulse 63   Temp 98.9 F (37.2 C)   Ht 5\' 5"  (1.651 m)   Wt 217 lb (98.4 kg)   SpO2 98%   BMI 36.11 kg/m   Opioid Risk Score:   Fall Risk Score:  `1  Depression screen PHQ 2/9  Depression screen [facility] 2/9 08/12/2020 06/11/2020 02/12/2020 12/18/2019 06/19/2019 01/18/2019 08/08/2018  Decreased Interest 0 0 0 0 1 0 0  Down, Depressed, Hopeless 0 - 0 0 1 0 0  PHQ - 2 Score 0 0 0 0 2 0 0  Altered sleeping - - 0 0 - 0 -  Tired, decreased energy - - 0 0 - 3 -  Change in appetite - - 0 0 - 0 -  Feeling bad or failure about yourself  - - 0 0 - 0 -  Trouble  concentrating - - 0 0 - 0 -  Moving slowly or fidgety/restless - - 0 0 - 0 -  Suicidal thoughts - - 0 0 - 0 -  PHQ-9 Score - - 0 0 - 3 -  Difficult doing work/chores - - Not difficult at all - - Not difficult at all -  Some recent data might be hidden    Review of Systems  Constitutional: Negative.   HENT: Negative.   Eyes: Negative.   Respiratory: Negative.   Cardiovascular: Negative.   Gastrointestinal: Negative.   Endocrine: Negative.   Genitourinary: Negative.   Musculoskeletal: Positive for arthralgias and back pain.  Skin: Negative.   Allergic/Immunologic: Negative.   Neurological: Negative.   Hematological: Negative.   Psychiatric/Behavioral: Negative.   All other systems reviewed and are negative.      Objective:   Physical Exam Vitals and nursing note reviewed.  Constitutional:      Appearance: Normal appearance.  Cardiovascular:     Rate and Rhythm: Normal rate and regular rhythm.     Pulses: Normal pulses.     Heart sounds: Normal heart sounds.  Pulmonary:     Effort: Pulmonary effort is normal.     Breath sounds: Normal breath sounds.  Musculoskeletal:     Cervical back: Normal range of motion and neck supple.  Skin:    Comments: Normal Muscle Bulk and Muscle Testing Reveals:  Upper Extremities: Upper Full ROM and Muscle Strength 5/5  Thoracic Paraspinal Tenderness: T-7-T-9  Lower Extremities: Full ROM and Muscle Strength 5/5 Arises from Table with ease Narrow Based Gait   Neurological:     Mental Status: She is alert and oriented to person, place, and time.  Psychiatric:        Mood and Affect: Mood normal.        Behavior: Behavior normal.           Assessment & Plan:  1. Cervical postlaminectomy syndrome:/ Cervicalgia/ Cervical Radiculitis/with neck and shoulder pain/ Chronic Midline Thoracic Pain.Continue exercise routine and  using heat therapy.10/14/2020 Continue:current medication regimen. Refilled:MS Contin 15 mg one tablet three  times a day #90.  We will continue the opioid monitoring program, this consists of regular clinic visits, examinations, urine drug screen, pill counts as well as use of [place] Controlled Substance Reporting system. A 12 month History has been reviewed on the [place] Controlled Substance Reporting Systemon04/20/2022. 2. Fibromyalgia/ Neuropathy: Continuecurrent medication regimen withGabapentin, activity and exercise regime.10/14/2020. 3.BilateralKnee Pain/ Mild Degenerative Changes:Continuecurrent medication regimen withVoltaren Gel.10/14/2020. 4.BilateralAnkle Pain:No complaints today.Continue HEP as tolerated. Continueto monitor.10/14/2020. 5. Muscle Spasm: Increase Robaxin to 4 times a day as needed for muscle spasm. Continuecurrent medication regimen Continue to Monitor.10/14/2020.. 6. Opioid Induced Constipation: No complaints Continue to Monitor.10/14/2020. 7. Midline Low Back Pain: Continue current medication regime, continue HEP as tolerated.10/14/2020. 8. Right Gluteal Medius Weakness and Atrophy: No complaints Today:Continue with Hip Abduction Exercisesas tolerated: Continue to Monitor.10/14/2020. 9. RightHip Pain/ Right Greater Trochanteric Tenderness:No complaints Today.Continue to Alternate Ice and Heat Therapy. Continue to Monitor.10/14/2020 10. Sacroiliac Joint Pain:No complaints today.S/PSacroiliac Injection on 08/24/2018 with good relief noted.10/14/2020 11.Lumbar Radiculitis:No complaints today.Continue Gabapentin.Continue HEP as tolerated. Continue current medication regimen. Continue to monitor.10/14/2020.  F/U in 1 month

## 2020-10-14 NOTE — Patient Instructions (Signed)
Increase Robaxin to 4 times a day as needed for muscle spasm

## 2020-10-21 ENCOUNTER — Other Ambulatory Visit: Payer: Self-pay

## 2020-10-21 ENCOUNTER — Ambulatory Visit (HOSPITAL_COMMUNITY)
Admission: RE | Admit: 2020-10-21 | Discharge: 2020-10-21 | Disposition: A | Discharge status: Home / Self Care | Payer: Medicare Other | Source: Ambulatory Visit | Attending: Physician Assistant | Admitting: Physician Assistant

## 2020-10-21 ENCOUNTER — Encounter (HOSPITAL_COMMUNITY): Payer: Self-pay | Admitting: Physician Assistant

## 2020-10-21 VITALS — BP 130/58 | HR 66 | Ht 65.0 in | Wt 216.8 lb

## 2020-10-21 DIAGNOSIS — I10 Essential (primary) hypertension: Secondary | ICD-10-CM | POA: Insufficient documentation

## 2020-10-21 DIAGNOSIS — Z6836 Body mass index (BMI) 36.0-36.9, adult: Secondary | ICD-10-CM | POA: Insufficient documentation

## 2020-10-21 DIAGNOSIS — Z8249 Family history of ischemic heart disease and other diseases of the circulatory system: Secondary | ICD-10-CM | POA: Diagnosis not present

## 2020-10-21 DIAGNOSIS — I480 Paroxysmal atrial fibrillation: Secondary | ICD-10-CM

## 2020-10-21 DIAGNOSIS — M797 Fibromyalgia: Secondary | ICD-10-CM | POA: Insufficient documentation

## 2020-10-21 DIAGNOSIS — Z79899 Other long term (current) drug therapy: Secondary | ICD-10-CM | POA: Diagnosis not present

## 2020-10-21 DIAGNOSIS — Z7901 Long term (current) use of anticoagulants: Secondary | ICD-10-CM | POA: Insufficient documentation

## 2020-10-21 DIAGNOSIS — I4892 Unspecified atrial flutter: Secondary | ICD-10-CM | POA: Insufficient documentation

## 2020-10-21 DIAGNOSIS — E118 Type 2 diabetes mellitus with unspecified complications: Secondary | ICD-10-CM | POA: Diagnosis not present

## 2020-10-21 DIAGNOSIS — G4733 Obstructive sleep apnea (adult) (pediatric): Secondary | ICD-10-CM | POA: Insufficient documentation

## 2020-10-21 DIAGNOSIS — D6869 Other thrombophilia: Secondary | ICD-10-CM | POA: Diagnosis not present

## 2020-10-21 DIAGNOSIS — E669 Obesity, unspecified: Secondary | ICD-10-CM | POA: Insufficient documentation

## 2020-10-21 NOTE — Progress Notes (Signed)
Primary Care Physician: Owens Loffler, MD Primary Cardiologist: Dr Ellyn Hack Primary Electrophysiologist: none Referring Physician: Dr Bary Leriche is a 70 y.o. female with a history of fibromyalgia, DM, atrial flutter, HTN, OSA, paroxysmal atrial fibrillation who presents for follow up in the [facility]. The patient was initially diagnosed with atrial fibrillation 04/01/19 after presenting with one week of fatigue and weakness. She underwent TEE/DCCV on 04/03/19. Patient is on Eliquis for a CHADS2VASC score of 4. She reports that she has done well since then until the night of 08/28/19 when she noted heart racing symptoms with some lightheaded. When she got up the next morning and check her home ECG device she was in afib with RVR. There were no specific triggers that the patient could identify however, she did have a COVID vaccine two weeks prior. She is on BiPap for OSA. She denies any significant alcohol use. Patient presented for DCCV on 09/09/19 in SR and the procedure was cancelled. She reports that she feels much better in SR with more energy, less SOB, no heart racing or dizziness.   On follow up today, patient reports she has done well since her last visit. She did have one episode of afib which resolved with an extra dose of flecainide. She denies any bleeding issues on anticoagulation.   Today, she denies symptoms of palpitations, chest pain, shortness of breath, orthopnea, PND, lower extremity edema, presyncope, syncope, bleeding, or neurologic sequela. The patient is tolerating medications without difficulties and is otherwise without complaint today.    Atrial Fibrillation Risk Factors:  she does have symptoms or diagnosis of sleep apnea. she is compliant with BiPAP therapy. she does not have a history of rheumatic fever. she does not have a history of alcohol use. The patient does not have a history of early familial atrial fibrillation or  other arrhythmias.  she has a BMI of Body mass index is 36.08 kg/m.Marland Kitchen Filed Weights   10/21/20 1426  Weight: 98.3 kg    Family History  Problem Relation Age of Onset  . Breast cancer Mother   . Diabetes Mother   . Heart disease Mother   . Allergies Mother   . Diabetes Father   . Heart disease Father   . Emphysema Father   . Allergies Father   . Stomach cancer Maternal Uncle   . Breast cancer Paternal Aunt   . Colon cancer Neg Hx      Atrial Fibrillation Management history:  Previous antiarrhythmic drugs: flecainide Previous cardioversions: 04/03/19 Previous ablations: none CHADS2VASC score: 4 Anticoagulation history: Eliquis   Past Medical History:  Diagnosis Date  . B12 deficiency   . Complication of anesthesia    slow to wake up   . Depression   . Diabetes mellitus type 2, diet-controlled ([place]) 07/24/2018  . Fibromyalgia   . Generalized hyperhidrosis 04/17/2014  . GERD (gastroesophageal reflux disease)   . History of rheumatic fever as a child   . Hyperlipemia   . Hypertension    denies htn on 05/09/16  . HYPOTHYROIDISM 05/29/2007  . Irritable bowel syndrome   . Migraines    hx of   . OSA (obstructive sleep apnea) 06/26/2019  . Senile calcific aortic valve sclerosis    Aortic Sclerosis-no stenosis   Past Surgical History:  Procedure Laterality Date  . [place] STUDY N/A 01/04/2016   Procedure: [place] STUDY;  Surgeon: Manus Gunning, MD;  Location: [facility] ENDOSCOPY;  Service: Gastroenterology;  Laterality: N/A;  . ABDOMINAL HYSTERECTOMY  1988  . ADENOIDECTOMY    . ANTERIOR CERVICAL DECOMP/DISCECTOMY FUSION  2009, 1993   first procedure in ~ 1993/Dr Botero  . APPENDECTOMY  1988  . BACK SURGERY    . CARDIOVERSION N/A 04/03/2019   Procedure: CARDIOVERSION;  Surgeon: Dorothy Spark, MD;  Location: [facility] ENDOSCOPY;  Service: Cardiovascular;  Laterality: N/A;  . CHOLECYSTECTOMY  2005  . ESOPHAGEAL MANOMETRY N/A 01/04/2016   Procedure: ESOPHAGEAL  MANOMETRY (EM) 24 HR PH;  Surgeon: Manus Gunning, MD;  Location: [facility] ENDOSCOPY;  Service: Gastroenterology;  Laterality: N/A;  . ESOPHAGOGASTRODUODENOSCOPY (EGD) WITH PROPOFOL N/A 05/17/2016   Procedure: ESOPHAGOGASTRODUODENOSCOPY (EGD) WITH PROPOFOL;  Surgeon: Manus Gunning, MD;  Location: [facility] ENDOSCOPY;  Service: Gastroenterology;  Laterality: N/A;  . NASAL SINUS SURGERY  2006  . SAVORY DILATION N/A 05/17/2016   Procedure: SAVORY DILATION;  Surgeon: Manus Gunning, MD;  Location: [facility]OS[facility];  Service: Gastroenterology;  Laterality: N/A;  . TEE WITHOUT CARDIOVERSION N/A 04/03/2019   Procedure: TRANSESOPHAGEAL ECHOCARDIOGRAM (TEE)-with DCCV;  Surgeon: Dorothy Spark, MD;  Location: [facility] ENDOSCOPY;; EF 55 to 60%.  No LVH.  Normal RV.  Moderate LA dilation.  No thrombus.  Mild RA dilation.  Mild to moderate TR.  Mild aortic valve sclerosis, no stenosis.  Moderately elevated PA pressures (47 mmHg)  . TONSILLECTOMY AND ADENOIDECTOMY    . TRANSTHORACIC ECHOCARDIOGRAM  2009   Normal LV size and function. EF 60-65%. No regional wall motion and amount is. Mild aortic sclerosis, no stenosis  . TUBAL LIGATION  1979    Current Outpatient Medications  Medication Sig Dispense Refill  . acetaminophen (TYLENOL) 650 MG CR tablet Take 650 mg by mouth in the morning and at bedtime.    . B Complex Vitamins (B COMPLEX 100 PO) Take 1 tablet by mouth every morning.     . bisacodyl (DULCOLAX) 5 MG EC tablet Take 10 mg by mouth at bedtime.    . Calcium Carbonate-Vitamin D (CALCIUM 600+D PO) Take 1 tablet by mouth in the morning and at bedtime.     . carboxymethylcellulose (REFRESH PLUS) 0.5 % SOLN Place 1 drop into both eyes 3 (three) times daily as needed (dry eyes).     . cetirizine (ZYRTEC) 10 MG tablet Take 10 mg by mouth at bedtime.     . Cholecalciferol (VITAMIN D) 125 MCG (5000 UT) CAPS Take 5,000 Units by mouth every morning.     . clindamycin (CLEOCIN T) 1 % lotion Apply 1  application topically daily as needed (eczema).     . Coenzyme Q-10 100 MG capsule Take 100 mg by mouth every morning.     . diclofenac sodium (VOLTAREN) 1 % GEL Apply 4 g topically 4 (four) times daily as needed (joint pain).     Marland Kitchen diltiazem (CARDIZEM CD) 240 MG 24 hr capsule TAKE 1 CAPSULE BY MOUTH EVERY DAY 90 capsule 1  . docusate sodium (COLACE) 250 MG capsule Take 250 mg by mouth at bedtime.    Marland Kitchen ELIQUIS 5 MG TABS tablet TAKE 1 TABLET BY MOUTH  TWICE DAILY 180 tablet 3  . flecainide (TAMBOCOR) 50 MG tablet TAKE 1 TABLET BY MOUTH  TWICE DAILY 180 tablet 3  . fluocinonide (LIDEX) 0.05 % external solution Apply 1 application topically daily as needed (eczema on scalp).     . gabapentin (NEURONTIN) 300 MG capsule TAKE 1 CAPSULE BY MOUTH 3  TIMES DAILY 270 capsule 3  .  GRAPE SEED EXTRACT PO Take 100 mg by mouth 2 (two) times daily.     Marland Kitchen levothyroxine (SYNTHROID) 50 MCG tablet TAKE 1 TABLET BY MOUTH  DAILY 90 tablet 3  . loratadine (CLARITIN) 10 MG tablet Take 10 mg by mouth every morning.     . Magnesium Bisglycinate (MAG GLYCINATE PO) Take 400 mg by mouth at bedtime.     . methocarbamol (ROBAXIN) 500 MG tablet Take 1 tablet (500 mg total) by mouth every 8 (eight) hours as needed for muscle spasms. 270 tablet 3  . mirtazapine (REMERON) 15 MG tablet TAKE 1 TABLET BY MOUTH AT  BEDTIME 90 tablet 1  . morphine (MS CONTIN) 15 MG 12 hr tablet Take 1 tablet (15 mg total) by mouth 3 (three) times daily. 90 tablet 0  . oxybutynin (DITROPAN-XL) 10 MG 24 hr tablet Take 10 mg by mouth at bedtime.    Marland Kitchen oxymetazoline (AFRIN) 0.05 % nasal spray Place 1 spray into both nostrils 2 (two) times daily as needed for congestion.    . RABEprazole (ACIPHEX) 20 MG tablet TAKE 1 TABLET BY MOUTH EVERY DAY 90 tablet 3  . tretinoin (RETIN-A) 0.05 % cream Apply 1 application topically daily as needed (acne).      No current facility-administered medications for this encounter.    Allergies  Allergen Reactions  .  Cyclobenzaprine Hcl Anaphylaxis and Swelling    Reaction - throat closed, tongue swelling  . Rizatriptan Benzoate Anaphylaxis    Throat and Tongue Swelling Closed  . Sumatriptan Swelling    Throat closed & Tongue Swelling  . Tizanidine Shortness Of Breath  . Aleve [Naproxen Sodium] Hives  . Clarithromycin Hives  . Doxycycline Diarrhea and Nausea And Vomiting  . Duloxetine Nausea Only  . Egg [Eggs Or Egg-Derived Products] Diarrhea and Nausea And Vomiting  . Keflex [Cephalexin] Itching and Other (See Comments)    [place]  . Metronidazole Diarrhea and Nausea Only  . Oxycodone Hcl Diarrhea and Nausea And Vomiting  . Oxycontin [Oxycodone Hcl] Nausea And Vomiting  . Penicillins Other (See Comments)    Unknown reaction Did it involve swelling of the face/tongue/throat, SOB, or low BP? Unknown Did it involve sudden or severe rash/hives, skin peeling, or any reaction on the inside of your mouth or nose? Unknown Did you need to seek medical attention at a hospital or doctor's office? Unknown When did it last happen?infant If all above answers are "NO", may proceed with cephalosporin use.  . Rosuvastatin Other (See Comments)    Muscle aches  . Venlafaxine Hives  . Voltaren [Diclofenac Sodium] Hives    Reaction to oral med, uses gel occasionally  . Septra [Sulfamethoxazole-Trimethoprim] Itching and Rash  . Tramadol Diarrhea, Nausea And Vomiting and Palpitations    Social History   Socioeconomic History  . Marital status: Married    Spouse name: Not on file  . Number of children: Not on file  . Years of education: Not on file  . Highest education level: Not on file  Occupational History  . Occupation: Radiation protection practitioner  . Occupation: retired  Tobacco Use  . Smoking status: Never Smoker  . Smokeless tobacco: Never Used  Vaping Use  . Vaping Use: Never used  Substance and Sexual Activity  . Alcohol use: Yes    Alcohol/week: 1.0 standard drink    Types: 1 Glasses of wine per week     Comment: rare  . Drug use: Never  . Sexual activity: Not Currently  Other Topics Concern  .  Not on file  Social History Narrative  . Not on file   Social Determinants of Health   Financial Resource Strain: Low Risk   . Difficulty of Paying Living Expenses: Not very hard  Food Insecurity: No Food Insecurity  . Worried About Charity fundraiser in the Last Year: Never true  . Ran Out of Food in the Last Year: Never true  Transportation Needs: No Transportation Needs  . Lack of Transportation (Medical): No  . Lack of Transportation (Non-Medical): No  Physical Activity: Inactive  . Days of Exercise per Week: 0 days  . Minutes of Exercise per Session: 0 min  Stress: No Stress Concern Present  . Feeling of Stress : Not at all  Social Connections: Not on file  Intimate Partner Violence: Not At Risk  . Fear of Current or Ex-Partner: No  . Emotionally Abused: No  . Physically Abused: No  . Sexually Abused: No     ROS- All systems are reviewed and negative except as per the HPI above.  Physical Exam: Vitals:   10/21/20 1426  BP: (!) 130/58  Pulse: 66  Weight: 98.3 kg  Height: 5\' 5"  (1.651 m)    GEN- The patient is a well appearing obese female, alert and oriented x 3 today.   HEENT-head normocephalic, atraumatic, sclera clear, conjunctiva pink, hearing intact, trachea midline. Lungs- Clear to ausculation bilaterally, normal work of breathing Heart- Regular rate and rhythm, no rubs or gallops, 2/6 systolic murmur  GI- soft, NT, ND, + BS Extremities- no clubbing, cyanosis, or edema MS- no significant deformity or atrophy Skin- no rash or lesion Psych- euthymic mood, full affect Neuro- strength and sensation are intact   Wt Readings from Last 3 Encounters:  10/21/20 98.3 kg  10/14/20 98.4 kg  09/14/20 100.5 kg    EKG today demonstrates  SR Vent. rate 66 BPM PR interval 176 ms QRS duration 90 ms QT/QTcB 396/415 ms  TEE 04/03/19 demonstrated  1. Left ventricular  ejection fraction, by visual estimation, is 55 to  60%. The left ventricle has normal function. Normal left ventricular size.  There is no left ventricular hypertrophy.  2. Global right ventricle has normal systolic function.The right  ventricular size is normal. No increase in right ventricular wall  thickness.  3. Left atrial size was moderately dilated.  4. No thrombus is seen in the left atrium or left atrial appendage.  5. Right atrial size was mildly dilated.  6. The mitral valve is normal in structure. Mild mitral valve  regurgitation. No evidence of mitral stenosis.  7. The tricuspid valve is normal in structure. Tricuspid valve  regurgitation mild-moderate.  8. The aortic valve is normal in structure. Aortic valve regurgitation is  mild by color flow Doppler. Mild aortic valve sclerosis without stenosis.  9. The pulmonic valve was normal in structure. Pulmonic valve  regurgitation is not visualized by color flow Doppler.  10. Moderately elevated pulmonary artery systolic pressure.  11. The tricuspid regurgitant velocity is 3.12 m/s, and with an assumed  right atrial pressure of 8 mmHg, the estimated right ventricular systolic  pressure is moderately elevated at 46.9 mmHg.  12. The inferior vena cava is normal in size with greater than 50%  respiratory variability, suggesting right atrial pressure of 3 mmHg.  13. No thrombus is seen in the left atrium or left atrial appendage.  14. This TEE was followed by a successful cardioversion.   Epic records are reviewed at length today  CHA2DS2-VASc  Score = 4 The patient's score is based upon: CHF History: No HTN History: Yes Age : 51-74 Diabetes History: Yes Stroke History: No Vascular Disease History: No Gender: Female   ASSESSMENT AND PLAN: 1. Paroxysmal Atrial Fibrillation/typical atrial flutter The patient's CHA2DS2-VASc score is 4, indicating a 4.8% annual risk of stroke.   Patient appears to be maintaining  SR. Continue flecainide 50 mg BID Continue diltiazem 240 mg daily  Continue Eliquis 5 mg BID  2. Secondary Hypercoagulable State (ICD10:  D68.69) The patient is at significant risk for stroke/thromboembolism based upon her CHA2DS2-VASc Score of 4.  Continue Apixaban (Eliquis).   3. Obesity Body mass index is 36.08 kg/m. Lifestyle modification was discussed and encouraged including regular physical activity and weight reduction.  4. OSA Patient reports compliance with BiPap therapy.  5. HTN Stable, no changes today.    Follow up in the AF clinic in 6 months.    [facility] [address] [place], [place] [address] [phone] 10/21/2020 2:32 PM

## 2020-10-23 LAB — TOXASSURE SELECT,+ANTIDEPR,UR

## 2020-10-26 ENCOUNTER — Telehealth: Payer: Self-pay | Admitting: *Deleted

## 2020-10-26 NOTE — Telephone Encounter (Signed)
Urine drug screen for this encounter is consistent for prescribed medication 

## 2020-10-27 ENCOUNTER — Other Ambulatory Visit: Payer: Self-pay

## 2020-10-27 ENCOUNTER — Encounter: Payer: Medicare Other | Admitting: Podiatry

## 2020-10-27 ENCOUNTER — Encounter: Payer: Medicare Other | Attending: Physical Medicine & Rehabilitation | Admitting: Physical Medicine & Rehabilitation

## 2020-10-27 ENCOUNTER — Encounter: Payer: Self-pay | Admitting: Physical Medicine & Rehabilitation

## 2020-10-27 VITALS — BP 121/74 | HR 62 | Temp 98.7°F | Ht 65.0 in | Wt 217.8 lb

## 2020-10-27 DIAGNOSIS — M546 Pain in thoracic spine: Secondary | ICD-10-CM | POA: Diagnosis not present

## 2020-10-27 DIAGNOSIS — G8929 Other chronic pain: Secondary | ICD-10-CM | POA: Diagnosis not present

## 2020-10-27 MED ORDER — MORPHINE SULFATE ER 15 MG PO TBCR: 15.0000 mg | EXTENDED_RELEASE_TABLET | Freq: Three times a day (TID) | ORAL | 0 refills | Status: DC

## 2020-10-27 NOTE — Progress Notes (Signed)
Subjective:    Patient ID: [name], female    DOB: [birth year], 70 y.o.   MRN: [id number]  HPI Increased Mid Back pain  70 yo female with thoracic degenerative disc with complaints of increased pain.  She does not recall any type of injury.  No new medical issues in the intervening time.  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  This bent over position tends to bother her.  The patient has been on stable regimen of pain medications MS Contin 15 mg 3 times daily. She does not have any pain going into her legs no numbness or tingling in the legs no bowel or bladder dysfunction.  The patient has a history of osteopenia but not osteoporosis. ACDF C4-5 1993, C3-4 2009 per Dr Joya Salm  Pain Inventory Average Pain 6 Pain Right Now 4 My pain is constant, sharp, burning, stabbing and aching  In the last 24 hours, has pain interfered with the following? General activity 7 Relation with others 6 Enjoyment of life 6 What TIME of day is your pain at its worst? evening and night Sleep (in general) Good  Pain is worse with: walking, bending, standing and some activites Pain improves with: rest and heat/ice Relief from Meds: 3     Family History  Problem Relation Age of Onset  . Breast cancer Mother   . Diabetes Mother   . Heart disease Mother   . Allergies Mother   . Diabetes Father   . Heart disease Father   . Emphysema Father   . Allergies Father   . Stomach cancer Maternal Uncle   . Breast cancer Paternal Aunt   . Colon cancer Neg Hx    Social History   Socioeconomic History  . Marital status: Married    Spouse name: Not on file  . Number of children: Not on file  . Years of education: Not on file  . Highest education level: Not on file  Occupational History  . Occupation: Radiation protection practitioner  . Occupation: retired  Tobacco Use  . Smoking status: Never Smoker  . Smokeless tobacco: Never Used  Vaping Use  . Vaping Use: Never used  Substance and Sexual  Activity  . Alcohol use: Yes    Alcohol/week: 1.0 standard drink    Types: 1 Glasses of wine per week    Comment: rare  . Drug use: Never  . Sexual activity: Not Currently  Other Topics Concern  . Not on file  Social History Narrative  . Not on file   Social Determinants of Health   Financial Resource Strain: Low Risk   . Difficulty of Paying Living Expenses: Not very hard  Food Insecurity: No Food Insecurity  . Worried About Charity fundraiser in the Last Year: Never true  . Ran Out of Food in the Last Year: Never true  Transportation Needs: No Transportation Needs  . Lack of Transportation (Medical): No  . Lack of Transportation (Non-Medical): No  Physical Activity: Inactive  . Days of Exercise per Week: 0 days  . Minutes of Exercise per Session: 0 min  Stress: No Stress Concern Present  . Feeling of Stress : Not at all  Social Connections: Not on file   Past Surgical History:  Procedure Laterality Date  . [place] STUDY N/A 01/04/2016   Procedure: [place] STUDY;  Surgeon: Manus Gunning, MD;  Location: [facility] ENDOSCOPY;  Service: Gastroenterology;  Laterality: N/A;  . ABDOMINAL HYSTERECTOMY  [place]    . ANTERIOR CERVICAL DECOMP/DISCECTOMY FUSION  2009, 1993   first procedure in ~ 1993/Dr Botero  . APPENDECTOMY  1988  . BACK SURGERY    . CARDIOVERSION N/A 04/03/2019   Procedure: CARDIOVERSION;  Surgeon: Dorothy Spark, MD;  Location: [facility] ENDOSCOPY;  Service: Cardiovascular;  Laterality: N/A;  . CHOLECYSTECTOMY  2005  . ESOPHAGEAL MANOMETRY N/A 01/04/2016   Procedure: ESOPHAGEAL MANOMETRY (EM) 24 HR PH;  Surgeon: Manus Gunning, MD;  Location: [facility] ENDOSCOPY;  Service: Gastroenterology;  Laterality: N/A;  . ESOPHAGOGASTRODUODENOSCOPY (EGD) WITH PROPOFOL N/A 05/17/2016   Procedure: ESOPHAGOGASTRODUODENOSCOPY (EGD) WITH PROPOFOL;  Surgeon: Manus Gunning, MD;  Location: [facility] ENDOSCOPY;  Service: Gastroenterology;  Laterality: N/A;  .  NASAL SINUS SURGERY  2006  . SAVORY DILATION N/A 05/17/2016   Procedure: SAVORY DILATION;  Surgeon: Manus Gunning, MD;  Location: [facility] ENDOSCOPY;  Service: Gastroenterology;  Laterality: N/A;  . TEE WITHOUT CARDIOVERSION N/A 04/03/2019   Procedure: TRANSESOPHAGEAL ECHOCARDIOGRAM (TEE)-with DCCV;  Surgeon: Dorothy Spark, MD;  Location: [facility] ENDOSCOPY;; EF 55 to 60%.  No LVH.  Normal RV.  Moderate LA dilation.  No thrombus.  Mild RA dilation.  Mild to moderate TR.  Mild aortic valve sclerosis, no stenosis.  Moderately elevated PA pressures (47 mmHg)  . TONSILLECTOMY AND ADENOIDECTOMY    . TRANSTHORACIC ECHOCARDIOGRAM  2009   Normal LV size and function. EF 60-65%. No regional wall motion and amount is. Mild aortic sclerosis, no stenosis  . TUBAL LIGATION  1979   Past Medical History:  Diagnosis Date  . B12 deficiency   . Complication of anesthesia    slow to wake up   . Depression   . Diabetes mellitus type 2, diet-controlled ([place]) 07/24/2018  . Fibromyalgia   . Generalized hyperhidrosis 04/17/2014  . GERD (gastroesophageal reflux disease)   . History of rheumatic fever as a child   . Hyperlipemia   . Hypertension    denies htn on 05/09/16  . HYPOTHYROIDISM 05/29/2007  . Irritable bowel syndrome   . Migraines    hx of   . OSA (obstructive sleep apnea) 06/26/2019  . Senile calcific aortic valve sclerosis    Aortic Sclerosis-no stenosis   There were no vitals taken for this visit.  Opioid Risk Score:   Fall Risk Score:  `1  Depression screen PHQ 2/9  Depression screen [facility] 2/9 08/12/2020 06/11/2020 02/12/2020 12/18/2019 06/19/2019 01/18/2019 08/08/2018  Decreased Interest 0 0 0 0 1 0 0  Down, Depressed, Hopeless 0 - 0 0 1 0 0  PHQ - 2 Score 0 0 0 0 2 0 0  Altered sleeping - - 0 0 - 0 -  Tired, decreased energy - - 0 0 - 3 -  Change in appetite - - 0 0 - 0 -  Feeling bad or failure about yourself  - - 0 0 - 0 -  Trouble concentrating - - 0 0 - 0 -  Moving slowly or  fidgety/restless - - 0 0 - 0 -  Suicidal thoughts - - 0 0 - 0 -  PHQ-9 Score - - 0 0 - 3 -  Difficult doing work/chores - - Not difficult at all - - Not difficult at all -  Some recent data might be hidden   Review of Systems  Musculoskeletal: Positive for back pain and gait problem.       Pain in both knees & right foot  All other systems reviewed  and are negative.      Objective:   Physical Exam Constitutional:      Appearance: She is normal weight.  Eyes:     Extraocular Movements: Extraocular movements intact.     Conjunctiva/sclera: Conjunctivae normal.     Pupils: Pupils are equal, round, and reactive to light.  Musculoskeletal:     Right lower leg: Edema present.     Left lower leg: Edema present.     Comments: No tenderness over the cervical or thoracic paraspinal musculature.  No pain with cervical spine range of motion no pain with shoulder range of motion There is pain with forward flexion and twisting in the thoracic area around T8.  Skin:    General: Skin is warm and dry.  Neurological:     Mental Status: She is alert and oriented to person, place, and time.     Cranial Nerves: No dysarthria.     Sensory: No sensory deficit.     Motor: Motor function is intact. No abnormal muscle tone.     Comments: Motor strength is 5/5 bilateral hip flexor knee extensor ankle dorsiflexor  Negative straight leg raising bilaterally Sensation normal to light touch and pinprick bilateral lower limbs. Gait without evidence of toe drag or knee instability Tone no evidence of spasticity   Psychiatric:        Mood and Affect: Mood normal.        Behavior: Behavior normal.           Assessment & Plan:  1.  Acute exacerbation of chronic thoracic pain.  She has had increased bending to care for her husband who has a colostomy that needs to be changed twice a day.  She has had no falls or other trauma.  She does have osteopenia, will check x-ray of the thoracic spine given risk  factors. We will send her to physical therapy to work on posture. Continue current pain medications MS Contin 15 mg 3 times daily Physical medicine rehab follow-up in 4 weeks

## 2020-10-29 ENCOUNTER — Encounter: Payer: Self-pay | Admitting: Family Medicine

## 2020-10-29 DIAGNOSIS — H2513 Age-related nuclear cataract, bilateral: Secondary | ICD-10-CM | POA: Diagnosis not present

## 2020-10-29 LAB — HM DIABETES EYE EXAM

## 2020-10-30 DIAGNOSIS — G4733 Obstructive sleep apnea (adult) (pediatric): Secondary | ICD-10-CM | POA: Diagnosis not present

## 2020-10-30 DIAGNOSIS — J9610 Chronic respiratory failure, unspecified whether with hypoxia or hypercapnia: Secondary | ICD-10-CM | POA: Diagnosis not present

## 2020-11-03 DIAGNOSIS — G4733 Obstructive sleep apnea (adult) (pediatric): Secondary | ICD-10-CM | POA: Diagnosis not present

## 2020-11-08 DIAGNOSIS — J9610 Chronic respiratory failure, unspecified whether with hypoxia or hypercapnia: Secondary | ICD-10-CM | POA: Diagnosis not present

## 2020-11-08 DIAGNOSIS — G4733 Obstructive sleep apnea (adult) (pediatric): Secondary | ICD-10-CM | POA: Diagnosis not present

## 2020-11-11 ENCOUNTER — Other Ambulatory Visit: Payer: Self-pay | Admitting: Cardiology

## 2020-11-27 ENCOUNTER — Ambulatory Visit: Payer: Medicare Other | Admitting: Physical Medicine & Rehabilitation

## 2020-11-30 ENCOUNTER — Ambulatory Visit: Payer: Medicare Other | Attending: Physical Medicine & Rehabilitation

## 2020-11-30 ENCOUNTER — Other Ambulatory Visit: Payer: Self-pay

## 2020-11-30 DIAGNOSIS — M6281 Muscle weakness (generalized): Secondary | ICD-10-CM | POA: Insufficient documentation

## 2020-11-30 DIAGNOSIS — M546 Pain in thoracic spine: Secondary | ICD-10-CM | POA: Diagnosis not present
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
## 2020-11-30 NOTE — Patient Instructions (Signed)
Access Code: [email or web address] URL: [email or web address] Date: 11/30/2020 Prepared by: Joneen Boers  Exercises Seated Scapular Retraction - 1 x daily - 7 x weekly - 3 sets - 10 reps - 5 seconds hold

## 2020-12-01 ENCOUNTER — Telehealth: Payer: Self-pay

## 2020-12-01 NOTE — Chronic Care Management (AMB) (Addendum)
Chronic Care Management Pharmacy Assistant   Name: [name]  MRN: [id number] DOB: 1951-03-07.  Reason for Encounter: Disease State DM, HLD   Recent office visits:  09/15/20 - Telephone - Patient discussed with PCP she would like to try diet versus resuming metformin due to random hypoglycemia in the past, PCP approves. 09/14/20- Dr.Copland PCP - We will initiate oral therapy to Metformin 500 mg BID with Accu-Chek, repeat A1c, and follow-up with me in 3 months. At this point, the patient should not be considered medically cleared for non-emergent surgery until her diabetes is under better control.  Recent consult visits:  11/30/20-Physical Therapy  Chronic thoracic pain  10/27/20-PM&R  Continue physical therapy no medication changes 10/21/20-Cardiology no medication changes follow up 6 months 10/14/20-PM&R Increased Robaxin to QID prn muscle spasm  Hospital visits:  None in previous 6 months  Medications: Outpatient Encounter Medications as of 12/01/2020  Medication Sig   acetaminophen (TYLENOL) 650 MG CR tablet Take 650 mg by mouth in the morning and at bedtime.   B Complex Vitamins (B COMPLEX 100 PO) Take 1 tablet by mouth every morning.    bisacodyl (DULCOLAX) 5 MG EC tablet Take 10 mg by mouth at bedtime.   Calcium Carbonate-Vitamin D (CALCIUM 600+D PO) Take 1 tablet by mouth in the morning and at bedtime.    carboxymethylcellulose (REFRESH PLUS) 0.5 % SOLN Place 1 drop into both eyes 3 (three) times daily as needed (dry eyes).    cetirizine (ZYRTEC) 10 MG tablet Take 10 mg by mouth at bedtime.    Cholecalciferol (VITAMIN D) 125 MCG (5000 UT) CAPS Take 5,000 Units by mouth every morning.    clindamycin (CLEOCIN T) 1 % lotion Apply 1 application topically daily as needed (eczema).    Coenzyme Q-10 100 MG capsule Take 100 mg by mouth every morning.    diclofenac sodium (VOLTAREN) 1 % GEL Apply 4 g topically 4 (four) times daily as needed (joint pain).    diltiazem (CARDIZEM CD) 240  MG 24 hr capsule TAKE 1 CAPSULE BY MOUTH EVERY DAY   docusate sodium (COLACE) 250 MG capsule Take 250 mg by mouth at bedtime.   ELIQUIS 5 MG TABS tablet TAKE 1 TABLET BY MOUTH  TWICE DAILY   flecainide (TAMBOCOR) 50 MG tablet TAKE 1 TABLET BY MOUTH  TWICE DAILY   fluocinonide (LIDEX) 0.05 % external solution Apply 1 application topically daily as needed (eczema on scalp).    gabapentin (NEURONTIN) 300 MG capsule TAKE 1 CAPSULE BY MOUTH 3  TIMES DAILY   GRAPE SEED EXTRACT PO Take 100 mg by mouth 2 (two) times daily.    levothyroxine (SYNTHROID) 50 MCG tablet TAKE 1 TABLET BY MOUTH  DAILY   loratadine (CLARITIN) 10 MG tablet Take 10 mg by mouth every morning.    Magnesium Bisglycinate (MAG GLYCINATE PO) Take 400 mg by mouth at bedtime.    methocarbamol (ROBAXIN) 500 MG tablet Take 1 tablet (500 mg total) by mouth every 8 (eight) hours as needed for muscle spasms.   mirtazapine (REMERON) 15 MG tablet TAKE 1 TABLET BY MOUTH AT  BEDTIME   morphine (MS CONTIN) 15 MG 12 hr tablet Take 1 tablet (15 mg total) by mouth 3 (three) times daily.   oxybutynin (DITROPAN-XL) 10 MG 24 hr tablet Take 10 mg by mouth at bedtime.   oxymetazoline (AFRIN) 0.05 % nasal spray Place 1 spray into both nostrils 2 (two) times daily as needed for congestion.   RABEprazole (  ACIPHEX) 20 MG tablet TAKE 1 TABLET BY MOUTH EVERY DAY   tretinoin (RETIN-A) 0.05 % cream Apply 1 application topically daily as needed (acne).    No facility-administered encounter medications on file as of 12/01/2020.   Recent Relevant Labs: Lab Results  Component Value Date/Time   HGBA1C 7.7 (H) 09/14/2020 02:37 PM   HGBA1C 7.6 (A) 06/15/2020 03:20 PM   HGBA1C 8.2 (H) 03/12/2020 01:25 PM   MICROALBUR 5.5 (H) 03/12/2020 01:47 PM   MICROALBUR <0.7 01/18/2019 02:07 PM    Kidney Function Lab Results  Component Value Date/Time   CREATININE 0.77 09/14/2020 02:37 PM   CREATININE 0.73 03/12/2020 01:25 PM   GFR 79.01 09/14/2020 02:37 PM   GFRNONAA >60  08/29/2019 04:15 PM   GFRAA >60 08/29/2019 04:15 PM    Current antihyperglycemic regimen:  No pharmacotherapy   What recent interventions/DTPs have been made to improve glycemic control: 09/15/20 - Patient will work on diet to improve A1c  Have there been any recent hospitalizations or ED visits since last visit with CPP? No  Patient denies hypoglycemic symptoms, including Pale, Sweaty, Shaky, Hungry, Nervous/irritable and Vision changes  Patient denies hyperglycemic symptoms, including blurry vision, excessive thirst, fatigue, polyuria and weakness  How often are you checking your blood sugar? The patient is not checking her BG's  Are you checking your feet daily/regularly? Yes  Adherence Review: Is the patient currently on a STATIN medication? No Is the patient currently on ACE/ARB medication? No   12/01/2020 Name: Alicia LEDEE MRN: [id number] DOB: 10-29-1950 Alicia HAEBERLE is a 70 y.o. year old female who is a primary care patient of Copland, Frederico Hamman, MD.  Comprehensive medication review performed; Spoke to patient regarding cholesterol  Lipid Panel    Component Value Date/Time   CHOL 188 03/12/2020 1325   TRIG 142.0 03/12/2020 1325   HDL 50.90 03/12/2020 1325   LDLCALC 108 (H) 03/12/2020 1325   LDLDIRECT 125.0 01/18/2019 1407    10-year ASCVD risk score: The 10-year ASCVD risk score Mikey Bussing DC Jr., et al., 2013) is: 18.7%   Values used to calculate the score:     Age: 37 years     Sex: Female     Is Non-Hispanic African American: No     Diabetic: Yes     Tobacco smoker: No     Systolic Blood Pressure: 016 mmHg     Is BP treated: Yes     HDL Cholesterol: 50.9 mg/dL     Total Cholesterol: 188 mg/dL  Current antihyperlipidemic regimen: No pharmacotherapy  Previous antihyperlipidemic medications tried: crestor and the patient states she has leg pains  ASCVD risk enhancing conditions: age >8 and DM  What recent interventions/DTPs have been made by any provider to  improve Cholesterol control since last CPP Visit: encouraged to keep up exercise and dietary management  Any recent hospitalizations or ED visits since last visit with CPP? No  What diet changes have been made to improve Cholesterol? The patient reports she is not snacking between meals and cooks healthy meals at home  What exercise is being done to improve Cholesterol? The patient has started physical therapy and plans stay on a program when the therapy is over  Adherence Review: Does the patient have >5 day gap between last estimated fill dates? No   No appointments scheduled within the next 30 days.  She also is a chronic pain patient and sees [facility] rehabilitation. She has monthly appointments.  Debbora Dus, CPP notified  Avel Sensor, [place] Pharmacy Assistant [phone]  I have reviewed the care management and care coordination activities outlined in this encounter and I am certifying that I agree with the content of this note.   She is due for A1c, PCP follow up at the end of this month. Will ask front office to call and schedule.  Debbora Dus, PharmD Clinical Pharmacist [facility] [phone]

## 2020-12-02 ENCOUNTER — Other Ambulatory Visit: Payer: Self-pay

## 2020-12-02 ENCOUNTER — Ambulatory Visit: Payer: Medicare Other

## 2020-12-02 DIAGNOSIS — M546 Pain in thoracic spine: Secondary | ICD-10-CM | POA: Diagnosis not present

## 2020-12-02 DIAGNOSIS — M6281 Muscle weakness (generalized): Secondary | ICD-10-CM | POA: Diagnosis not present

## 2020-12-02 NOTE — Patient Instructions (Signed)
Access Code: [email or web address] URL: [email or web address] Date: 12/02/2020 Prepared by: Joneen Boers  Exercises Seated Scapular Retraction - 1 x daily - 7 x weekly - 3 sets - 10 reps - 5 seconds hold Supine Posterior Pelvic Tilt - 1 x daily - 7 x weekly - 3 sets - 10 reps - 5 seconds hold
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
## 2020-12-03 NOTE — Telephone Encounter (Addendum)
Patient due for A1c and PCP follow up on or after 12/15/20. Last seen 09/14/20 with elevated A1c. Plan for follow up 3 months for surgical clearance.  Sending to office staff for scheduling.  Debbora Dus, PharmD Clinical Pharmacist [facility] [phone]

## 2020-12-04 NOTE — Telephone Encounter (Signed)
Spoke with patient scheduled follow up appointment

## 2020-12-07 ENCOUNTER — Other Ambulatory Visit: Payer: Self-pay

## 2020-12-07 ENCOUNTER — Ambulatory Visit: Payer: Medicare Other

## 2020-12-07 DIAGNOSIS — M6281 Muscle weakness (generalized): Secondary | ICD-10-CM | POA: Diagnosis not present

## 2020-12-07 DIAGNOSIS — M546 Pain in thoracic spine: Secondary | ICD-10-CM | POA: Diagnosis not present

## 2020-12-07 NOTE — Patient Instructions (Signed)
Standing R lateral shift correction 10x5 seconds for 3 sets             No thoracic back pain afterwards  Reviewed and given as part of her HEP. Pt demonstrated and verbalized understanding. Handout provided.
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
## 2020-12-09 ENCOUNTER — Ambulatory Visit: Payer: Medicare Other

## 2020-12-09 ENCOUNTER — Telehealth: Payer: Self-pay | Admitting: Registered Nurse

## 2020-12-09 DIAGNOSIS — M6281 Muscle weakness (generalized): Secondary | ICD-10-CM | POA: Diagnosis not present

## 2020-12-09 DIAGNOSIS — G4733 Obstructive sleep apnea (adult) (pediatric): Secondary | ICD-10-CM | POA: Diagnosis not present

## 2020-12-09 DIAGNOSIS — M546 Pain in thoracic spine: Secondary | ICD-10-CM | POA: Diagnosis not present

## 2020-12-09 DIAGNOSIS — J9610 Chronic respiratory failure, unspecified whether with hypoxia or hypercapnia: Secondary | ICD-10-CM | POA: Diagnosis not present

## 2020-12-09 NOTE — Telephone Encounter (Signed)
Placed a call to Ms. [place], no answer. Left message to return the call.
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
## 2020-12-10 ENCOUNTER — Ambulatory Visit: Payer: Medicare Other | Admitting: Family Medicine

## 2020-12-10 ENCOUNTER — Telehealth: Payer: Self-pay

## 2020-12-10 MED ORDER — MORPHINE SULFATE ER 15 MG PO TBCR: 15.0000 mg | EXTENDED_RELEASE_TABLET | Freq: Three times a day (TID) | ORAL | 0 refills | Status: DC

## 2020-12-10 NOTE — Telephone Encounter (Signed)
Patient called stating she is returning your call about a medication.

## 2020-12-10 NOTE — Telephone Encounter (Signed)
Return Ms. Alicia call, MS Contin e- scribed  today, she has an appointment Dr Letta Pate. [name] understanding.

## 2020-12-11 ENCOUNTER — Other Ambulatory Visit: Payer: Self-pay | Admitting: Family Medicine

## 2020-12-11 DIAGNOSIS — Z1231 Encounter for screening mammogram for malignant neoplasm of breast: Secondary | ICD-10-CM

## 2020-12-14 ENCOUNTER — Ambulatory Visit: Payer: Medicare Other

## 2020-12-14 ENCOUNTER — Other Ambulatory Visit: Payer: Self-pay

## 2020-12-14 DIAGNOSIS — M546 Pain in thoracic spine: Secondary | ICD-10-CM

## 2020-12-14 DIAGNOSIS — M6281 Muscle weakness (generalized): Secondary | ICD-10-CM
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
## 2020-12-16 ENCOUNTER — Ambulatory Visit: Payer: Medicare Other

## 2020-12-16 ENCOUNTER — Other Ambulatory Visit: Payer: Self-pay | Admitting: Family Medicine

## 2020-12-16 DIAGNOSIS — M546 Pain in thoracic spine: Secondary | ICD-10-CM

## 2020-12-16 DIAGNOSIS — M6281 Muscle weakness (generalized): Secondary | ICD-10-CM | POA: Diagnosis not present
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
## 2020-12-17 ENCOUNTER — Other Ambulatory Visit: Payer: Self-pay

## 2020-12-17 ENCOUNTER — Ambulatory Visit
Admission: RE | Admit: 2020-12-17 | Discharge: 2020-12-17 | Disposition: A | Discharge status: Home / Self Care | Payer: Medicare Other | Source: Ambulatory Visit | Attending: Physical Medicine & Rehabilitation | Admitting: Physical Medicine & Rehabilitation

## 2020-12-17 DIAGNOSIS — M546 Pain in thoracic spine: Secondary | ICD-10-CM

## 2020-12-17 DIAGNOSIS — G8929 Other chronic pain: Secondary | ICD-10-CM | POA: Diagnosis not present

## 2020-12-17 DIAGNOSIS — M5134 Other intervertebral disc degeneration, thoracic region: Secondary | ICD-10-CM | POA: Diagnosis not present

## 2020-12-17 NOTE — Telephone Encounter (Signed)
Orders placed and xrays done.

## 2020-12-21 ENCOUNTER — Ambulatory Visit (INDEPENDENT_AMBULATORY_CARE_PROVIDER_SITE_OTHER): Payer: Medicare Other | Admitting: Family Medicine

## 2020-12-21 ENCOUNTER — Encounter: Payer: Self-pay | Admitting: Family Medicine

## 2020-12-21 ENCOUNTER — Other Ambulatory Visit: Payer: Self-pay

## 2020-12-21 ENCOUNTER — Ambulatory Visit: Payer: Medicare Other

## 2020-12-21 VITALS — BP 104/60 | HR 72 | Temp 98.4°F | Ht 64.65 in | Wt 215.8 lb

## 2020-12-21 DIAGNOSIS — M6281 Muscle weakness (generalized): Secondary | ICD-10-CM

## 2020-12-21 DIAGNOSIS — M546 Pain in thoracic spine: Secondary | ICD-10-CM

## 2020-12-21 DIAGNOSIS — I10 Essential (primary) hypertension: Secondary | ICD-10-CM

## 2020-12-21 DIAGNOSIS — E119 Type 2 diabetes mellitus without complications: Secondary | ICD-10-CM | POA: Diagnosis not present

## 2020-12-21 LAB — POCT GLYCOSYLATED HEMOGLOBIN (HGB A1C): Hemoglobin A1C: 7 % — AB (ref 4.0–5.6)

## 2020-12-21 NOTE — Progress Notes (Signed)
[name] T. Zamara Cozad, MD, [facility] at [facility] [place], [address]  Phone: [phone]  FAX: [phone]  [name] - 70 y.o. female  MRN [id number]  Date of Birth: 11-01-1950  Date: 12/21/2020  PCP: Owens Loffler, MD  Referral: Owens Loffler, MD  Chief Complaint  Patient presents with   Diabetes    This visit occurred during the SARS-CoV-2 public health emergency.  Safety protocols were in place, including screening questions prior to the visit, additional usage of staff PPE, and extensive cleaning of exam room while observing appropriate contact time as indicated for disinfecting solutions.   Subjective:   [name] is a 70 y.o. very pleasant female patient with Body mass index is 36.29 kg/m. who presents with the following:  Diabetes Mellitus: Tolerating Medications: yes Compliance with diet: fair, Body mass index is 36.29 kg/m. Doing a lot better.  Cut back on ice cream. Exercise: minimal / intermittent Avg blood sugars at home: not checking Foot problems:  Hypoglycemia: none No nausea, vomitting, blurred vision, polyuria.  Lab Results  Component Value Date   HGBA1C 7.0 (A) 12/21/2020   HGBA1C 7.7 (H) 09/14/2020   HGBA1C 7.6 (A) 06/15/2020   Lab Results  Component Value Date   MICROALBUR 5.5 (H) 03/12/2020   LDLCALC 108 (H) 03/12/2020   CREATININE 0.77 09/14/2020    Wt Readings from Last 3 Encounters:  12/21/20 215 lb 12 oz (97.9 kg)  10/27/20 217 lb 12.8 oz (98.8 kg)  10/21/20 216 lb 12.8 oz (98.3 kg)    HTN: Tolerating all medications without side effects Stable and at goal No CP, no sob. No HA.  BP Readings from Last 3 Encounters:  12/21/20 104/60  10/27/20 121/74  10/21/20 (!) 903/00    Basic Metabolic Panel:    Component Value Date/Time   NA 139 09/14/2020 1437   NA 140 07/09/2018 1422   K 4.3 09/14/2020 1437   CL 99 09/14/2020 1437   CO2 32 09/14/2020 1437    BUN 15 09/14/2020 1437   BUN 13 07/09/2018 1422   CREATININE 0.77 09/14/2020 1437   GLUCOSE 213 (H) 09/14/2020 1437   CALCIUM 9.5 09/14/2020 1437    Husband doing better with Colon cancer.     Review of Systems is noted in the HPI, as appropriate  Objective:   BP 104/60   Pulse 72   Temp 98.4 F (36.9 C) (Temporal)   Ht 5' 4.65" (1.642 m)   Wt 215 lb 12 oz (97.9 kg)   SpO2 97%   BMI 36.29 kg/m   GEN: No acute distress; alert,appropriate. PULM: Breathing comfortably in no respiratory distress PSYCH: Normally interactive.  CV: RRR, no m/g/r  PULM: Normal respiratory rate, no accessory muscle use. No wheezes, crackles or rhonchi   Laboratory and Imaging Data: Results for orders placed or performed in visit on 12/21/20  POCT glycosylated hemoglobin (Hb A1C)  Result Value Ref Range   Hemoglobin A1C 7.0 (A) 4.0 - 5.6 %   HbA1c POC (<> result, manual entry)     HbA1c, POC (prediabetic range)     HbA1c, POC (controlled diabetic range)       Assessment and Plan:     ICD-10-CM   1. Diabetes mellitus type 2, diet-controlled (HCC)  E11.9 POCT glycosylated hemoglobin (Hb A1C)    2. Essential hypertension  I10      Blood sugar has normalized to an A1c of 7. Blood  pressure is stable.  Follow-up 6 months health maintenance.  Predominantly now she is having foot pain, toe pain, post complicated wound complications after left-sided foot surgery by podiatry.  This scan and topical tissue has healed at this point.  Orders Placed This Encounter  Procedures   POCT glycosylated hemoglobin (Hb A1C)    Follow-up: Return in about 6 months (around 06/22/2021) for Medicare Wellness.  Signed,  Maud Deed. Jerral Mccauley, MD   Outpatient Encounter Medications as of 12/21/2020  Medication Sig   acetaminophen (TYLENOL) 650 MG CR tablet Take 650 mg by mouth in the morning and at bedtime.   B Complex Vitamins (B COMPLEX 100 PO) Take 1 tablet by mouth every morning.    Calcium  Carbonate-Vitamin D (CALCIUM 600+D PO) Take 1 tablet by mouth in the morning and at bedtime.    carboxymethylcellulose (REFRESH PLUS) 0.5 % SOLN Place 1 drop into both eyes 3 (three) times daily as needed (dry eyes).    cetirizine (ZYRTEC) 10 MG tablet Take 10 mg by mouth at bedtime.    Cholecalciferol (VITAMIN D) 125 MCG (5000 UT) CAPS Take 5,000 Units by mouth every morning.    clindamycin (CLEOCIN T) 1 % lotion Apply 1 application topically daily as needed (eczema).    Coenzyme Q-10 100 MG capsule Take 100 mg by mouth every morning.    diclofenac sodium (VOLTAREN) 1 % GEL Apply 4 g topically 4 (four) times daily as needed (joint pain).    diltiazem (CARDIZEM CD) 240 MG 24 hr capsule TAKE 1 CAPSULE BY MOUTH EVERY DAY   docusate sodium (COLACE) 250 MG capsule Take 250 mg by mouth at bedtime.   ELIQUIS 5 MG TABS tablet TAKE 1 TABLET BY MOUTH  TWICE DAILY   flecainide (TAMBOCOR) 50 MG tablet TAKE 1 TABLET BY MOUTH  TWICE DAILY   fluocinonide (LIDEX) 0.05 % external solution Apply 1 application topically daily as needed (eczema on scalp).    gabapentin (NEURONTIN) 300 MG capsule TAKE 1 CAPSULE BY MOUTH 3  TIMES DAILY   GRAPE SEED EXTRACT PO Take 100 mg by mouth 2 (two) times daily.    levothyroxine (SYNTHROID) 50 MCG tablet TAKE 1 TABLET BY MOUTH  DAILY   loratadine (CLARITIN) 10 MG tablet Take 10 mg by mouth every morning.    Magnesium Bisglycinate (MAG GLYCINATE PO) Take 400 mg by mouth at bedtime.    methocarbamol (ROBAXIN) 500 MG tablet Take 1 tablet (500 mg total) by mouth every 8 (eight) hours as needed for muscle spasms.   mirtazapine (REMERON) 15 MG tablet TAKE 1 TABLET BY MOUTH AT  BEDTIME   morphine (MS CONTIN) 15 MG 12 hr tablet Take 1 tablet (15 mg total) by mouth 3 (three) times daily.   oxybutynin (DITROPAN-XL) 10 MG 24 hr tablet Take 10 mg by mouth at bedtime.   oxymetazoline (AFRIN) 0.05 % nasal spray Place 1 spray into both nostrils 2 (two) times daily as needed for congestion.    RABEprazole (ACIPHEX) 20 MG tablet TAKE 1 TABLET BY MOUTH EVERY DAY   senna (SENOKOT) 8.6 MG TABS tablet Take 4 tablets by mouth at bedtime.   tretinoin (RETIN-A) 0.05 % cream Apply 1 application topically daily as needed (acne).    [DISCONTINUED] bisacodyl (DULCOLAX) 5 MG EC tablet Take 10 mg by mouth at bedtime.   No facility-administered encounter medications on file as of 12/21/2020.
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
## 2020-12-22 ENCOUNTER — Ambulatory Visit
Admission: RE | Admit: 2020-12-22 | Discharge: 2020-12-22 | Disposition: A | Discharge status: Home / Self Care | Payer: Medicare Other | Source: Ambulatory Visit | Attending: Family Medicine | Admitting: Family Medicine

## 2020-12-22 ENCOUNTER — Encounter: Payer: Self-pay | Admitting: Physical Medicine & Rehabilitation

## 2020-12-22 ENCOUNTER — Encounter: Payer: Medicare Other | Attending: Physical Medicine & Rehabilitation | Admitting: Physical Medicine & Rehabilitation

## 2020-12-22 VITALS — BP 115/67 | HR 61 | Temp 98.6°F | Ht 65.0 in | Wt 214.0 lb

## 2020-12-22 DIAGNOSIS — G8929 Other chronic pain: Secondary | ICD-10-CM

## 2020-12-22 DIAGNOSIS — M546 Pain in thoracic spine: Secondary | ICD-10-CM

## 2020-12-22 DIAGNOSIS — Z1231 Encounter for screening mammogram for malignant neoplasm of breast: Secondary | ICD-10-CM | POA: Diagnosis not present

## 2020-12-22 MED ORDER — MORPHINE SULFATE ER 15 MG PO TBCR: 15.0000 mg | EXTENDED_RELEASE_TABLET | Freq: Three times a day (TID) | ORAL | 0 refills | Status: DC

## 2020-12-22 NOTE — Progress Notes (Signed)
Subjective:    Patient ID: [name], female    DOB: 1950/10/04, 70 y.o.   MRN: [id number] 70 yo female with thoracic degenerative disc with complaints of increased pain.  She does not recall any type of injury.  No new medical issues in the intervening time.  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  This bent over position tends to bother her.  The patient has been on stable regimen of pain medications MS Contin 15 mg 3 times daily. She does not have any pain going into her legs no numbness or tingling in the legs no bowel or bladder dysfunction.  The patient has a history of osteopenia but not osteoporosis. ACDF C4-5 1993, C3-4 2009 per Dr Joya Salm HPI Follow-up for exacerbation of thoracic pain.  We are to review x-ray results.  Going to OP PT, working on posture and flexibility  , reviewed xrays  CLINICAL DATA:  Acute on chronic back pain   EXAM: THORACIC SPINE 2 VIEWS   COMPARISON:  05/14/2016   FINDINGS: Normal thoracic kyphosis. No acute fracture or listhesis of the thoracic spine. Vertebral body height has been preserved. There is intervertebral disc space narrowing and endplate remodeling of the mid and lower thoracic spine in keeping with changes of mild to moderate degenerative disc disease, slightly progressive since prior examination. The paraspinal soft tissues are unremarkable.   IMPRESSION: Progressive, mild to moderate degenerative disc disease of the mid and lower thoracic spine.     Electronically Signed   By: Fidela Salisbury MD   On: 12/18/2020 04:49   Pain Inventory Average Pain 5 Pain Right Now 4 My pain is constant, sharp, burning, dull, stabbing, and aching  In the last 24 hours, has pain interfered with the following? General activity 5 Relation with others 5 Enjoyment of life 5 What TIME of day is your pain at its worst? evening and night Sleep (in general) Fair  Pain is worse with: walking, bending, standing, and some  activites Pain improves with: rest, heat/ice, and medication Relief from Meds: 4  Family History  Problem Relation Age of Onset   Breast cancer Mother    Diabetes Mother    Heart disease Mother    Allergies Mother    Diabetes Father    Heart disease Father    Emphysema Father    Allergies Father    Stomach cancer Maternal Uncle    Breast cancer Paternal Aunt    Colon cancer Neg Hx    Social History   Socioeconomic History   Marital status: Married    Spouse name: Not on file   Number of children: Not on file   Years of education: Not on file   Highest education level: Not on file  Occupational History   Occupation: Radiation protection practitioner   Occupation: retired  Tobacco Use   Smoking status: Never   Smokeless tobacco: Never  Vaping Use   Vaping Use: Never used  Substance and Sexual Activity   Alcohol use: Yes    Alcohol/week: 1.0 standard drink    Types: 1 Glasses of wine per week    Comment: rare   Drug use: Never   Sexual activity: Not Currently  Other Topics Concern   Not on file  Social History Narrative   Not on file   Social Determinants of Health   Financial Resource Strain: Low Risk    Difficulty of Paying Living Expenses: Not very hard  Food Insecurity: No Food  Insecurity   Worried About Charity fundraiser in the Last Year: Never true   [place] in the Last Year: Never true  Transportation Needs: No Transportation Needs   Lack of Transportation (Medical): No   Lack of Transportation (Non-Medical): No  Physical Activity: Inactive   Days of Exercise per Week: 0 days   Minutes of Exercise per Session: 0 min  Stress: No Stress Concern Present   Feeling of Stress : Not at all  Social Connections: Not on file   Past Surgical History:  Procedure Laterality Date   47 HOUR [place] STUDY N/A 01/04/2016   Procedure: [place];  Surgeon: Manus Gunning, MD;  Location: [facility];  Service: Gastroenterology;  Laterality: N/A;   ABDOMINAL  HYSTERECTOMY  1988   ADENOIDECTOMY     ANTERIOR CERVICAL DECOMP/DISCECTOMY FUSION  2009, 1993   first procedure in ~ 1993/Dr [place] N/A 04/03/2019   Procedure: CARDIOVERSION;  Surgeon: Dorothy Spark, MD;  Location: [facility];  Service: Cardiovascular;  Laterality: N/A;   CHOLECYSTECTOMY  2005   ESOPHAGEAL MANOMETRY N/A 01/04/2016   Procedure: ESOPHAGEAL MANOMETRY (EM) 24 HR PH;  Surgeon: Manus Gunning, MD;  [facility];  Service: Gastroenterology;  Laterality: N/A;   ESOPHAGOGASTRODUODENOSCOPY (EGD) WITH PROPOFOL N/A 05/17/2016   Procedure: ESOPHAGOGASTRODUODENOSCOPY (EGD) WITH PROPOFOL;  Surgeon: Manus Gunning, MD;  [facility]: [facility];  Service: Gastroenterology;  Laterality: N/A;   NASAL SINUS SURGERY  2006   SAVORY DILATION N/A 05/17/2016   Procedure: SAVORY DILATION;  Surgeon: Manus Gunning, MD;  [facility];  Service: Gastroenterology;  Laterality: N/A;   TEE WITHOUT CARDIOVERSION N/A 04/03/2019   Procedure: TRANSESOPHAGEAL ECHOCARDIOGRAM (TEE)-with DCCV;  Surgeon: Dorothy Spark, MD;  Location: [facility] ENDOSCOPY;; EF 55 to 60%.  No LVH.  Normal RV.  Moderate LA dilation.  No thrombus.  Mild RA dilation.  Mild to moderate TR.  Mild aortic valve sclerosis, no stenosis.  Moderately elevated PA pressures (47 mmHg)   TONSILLECTOMY AND ADENOIDECTOMY     TRANSTHORACIC ECHOCARDIOGRAM  2009   Normal LV size and function. EF 60-65%. No regional wall motion and amount is. Mild aortic sclerosis, no stenosis   TUBAL LIGATION  1979   Past Surgical History:  Procedure Laterality Date   51 HOUR [place] STUDY N/A 01/04/2016   Procedure: 24 HOUR [place] STUDY;  Surgeon: Manus Gunning, MD;  [facility];  Service: Gastroenterology;  Laterality: N/A;   ABDOMINAL HYSTERECTOMY  1988   ADENOIDECTOMY     ANTERIOR CERVICAL DECOMP/DISCECTOMY FUSION  2009, 1993   first procedure in ~  1993/Dr [place] N/A 04/03/2019   Procedure: CARDIOVERSION;  Surgeon: Dorothy Spark, MD;  Location: [place];  Service: Cardiovascular;  Laterality: N/A;   CHOLECYSTECTOMY  2005   ESOPHAGEAL MANOMETRY N/A 01/04/2016   Procedure: ESOPHAGEAL MANOMETRY (EM) 24 HR PH;  Surgeon: Manus Gunning, MD;  [facility];  Service: Gastroenterology;  Laterality: N/A;   ESOPHAGOGASTRODUODENOSCOPY (EGD) WITH PROPOFOL N/A 05/17/2016   Procedure: ESOPHAGOGASTRODUODENOSCOPY (EGD) WITH PROPOFOL;  Surgeon: Manus Gunning, MD;  [facility];  Service: Gastroenterology;  Laterality: N/A;   NASAL SINUS SURGERY  2006   SAVORY DILATION N/A 05/17/2016   Procedure: SAVORY DILATION;  Surgeon: Manus Gunning, MD;  [facility];  Service: Gastroenterology;  Laterality: N/A;   TEE WITHOUT CARDIOVERSION N/A 04/03/2019   Procedure: TRANSESOPHAGEAL ECHOCARDIOGRAM (TEE)-with DCCV;  Surgeon: Dorothy Spark, MD;  Location[facility]COPY;; EF 55 to 60%.  No LVH.  Normal RV.  Moderate LA dilation.  No thrombus.  Mild RA dilation.  Mild to moderate TR.  Mild aortic valve sclerosis, no stenosis.  Moderately elevated PA pressures (47 mmHg)   TONSILLECTOMY AND ADENOIDECTOMY     TRANSTHORACIC ECHOCARDIOGRAM  2009   Normal LV size and function. EF 60-65%. No regional wall motion and amount is. Mild aortic sclerosis, no stenosis   TUBAL LIGATION  1979   Past Medical History:  Diagnosis Date   B12 deficiency    Complication of anesthesia    slow to wake up    Depression    Diabetes mellitus type 2, diet-controlled ([place]) 07/24/2018   Fibromyalgia    Generalized hyperhidrosis 04/17/2014   GERD (gastroesophageal reflux disease)    History of rheumatic fever as a child    Hyperlipemia    Hypertension    denies htn on 05/09/16   HYPOTHYROIDISM 05/29/2007   Irritable bowel syndrome    Migraines    hx of    OSA  (obstructive sleep apnea) 06/26/2019   Senile calcific aortic valve sclerosis    Aortic Sclerosis-no stenosis   BP 115/67   Pulse 61   Temp 98.6 F (37 C)   Ht 5\' 5"  (1.651 m)   Wt 214 lb (97.1 kg)   SpO2 96%   BMI 35.61 kg/m   Opioid Risk Score:   Fall Risk Score:  `1  Depression screen PHQ 2/9  Depression screen [facility] 2/9 10/27/2020 08/12/2020 06/11/2020 02/12/2020 12/18/2019 06/19/2019 01/18/2019  Decreased Interest 1 0 0 0 0 1 0  Down, Depressed, Hopeless 1 0 - 0 0 1 0  PHQ - 2 Score 2 0 0 0 0 2 0  Altered sleeping - - - 0 0 - 0  Tired, decreased energy - - - 0 0 - 3  Change in appetite - - - 0 0 - 0  Feeling bad or failure about yourself  - - - 0 0 - 0  Trouble concentrating - - - 0 0 - 0  Moving slowly or fidgety/restless - - - 0 0 - 0  Suicidal thoughts - - - 0 0 - 0  PHQ-9 Score - - - 0 0 - 3  Difficult doing work/chores - - - Not difficult at all - - Not difficult at all  Some recent data might be hidden    Review of Systems  Constitutional: Negative.   HENT: Negative.    Eyes: Negative.   Respiratory: Negative.    Cardiovascular: Negative.   Gastrointestinal: Negative.   Endocrine: Negative.   Genitourinary: Negative.   Musculoskeletal:  Positive for arthralgias and back pain.  Skin: Negative.   Allergic/Immunologic: Negative.   Neurological: Negative.   Psychiatric/Behavioral: Negative.    All other systems reviewed and are negative.     Objective:   Physical Exam Vitals and nursing note reviewed.  Constitutional:      Appearance: She is obese.  Eyes:     Extraocular Movements: Extraocular movements intact.     Conjunctiva/sclera: Conjunctivae normal.     Pupils: Pupils are equal, round, and reactive to light.  Musculoskeletal:     Comments: There is mild tenderness in the thoracic paraspinals around T8-T9 and to a lesser extent around T12-L1 area. Thoracic range of motion  is approximately 50% with flexion extension lateral bending and rotation.  The  patient has less pain with range of motion than at last visit.  Neurological:     General: No focal deficit present.     Mental Status: She is alert and oriented to person, place, and time.     Sensory: Sensation is intact.     Motor: No weakness.     Coordination: Coordination is intact.     Gait: Gait is intact.     Deep Tendon Reflexes:     Reflex Scores:      Patellar reflexes are 1+ on the right side and 1+ on the left side.      Achilles reflexes are 1+ on the right side and 1+ on the left side.    Comments: Motor strength is 5/5 bilateral hip flexor knee extensor ankle dorsiflexor Negative straight leg raising Normal sensation bilateral lower extremities and L3-4-5 S1 dermatome distribution  Psychiatric:        Mood and Affect: Mood normal.        Behavior: Behavior normal.          Assessment & Plan:  1.  Thoracic pain history of thoracic degenerative disc minimal progression of degenerative changes over the last 5 years since the prior x-ray.  She seems to be responding well to physical therapy at this point as expected her range of motion is improving but it has not been enough time for her strength to improve.  As her strength improves she should also have some reduction in her pain levels. Continue MS Contin 15 mg 3 times daily Follow-up with nurse practitioner, Zella Ball next visit PDMP reviewed, appropriate Last UDS 10/14/2020 is consistent

## 2020-12-22 NOTE — Patient Instructions (Signed)
Keep up with exercises for thoracic area

## 2020-12-23 ENCOUNTER — Ambulatory Visit: Payer: Medicare Other

## 2020-12-23 ENCOUNTER — Other Ambulatory Visit: Payer: Self-pay

## 2020-12-23 DIAGNOSIS — M546 Pain in thoracic spine: Secondary | ICD-10-CM

## 2020-12-23 DIAGNOSIS — M6281 Muscle weakness (generalized): Secondary | ICD-10-CM | POA: Diagnosis not present
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
## 2020-12-24 DIAGNOSIS — H019 Unspecified inflammation of eyelid: Secondary | ICD-10-CM | POA: Diagnosis not present

## 2020-12-24 DIAGNOSIS — H02834 Dermatochalasis of left upper eyelid: Secondary | ICD-10-CM | POA: Diagnosis not present

## 2020-12-24 DIAGNOSIS — H02831 Dermatochalasis of right upper eyelid: Secondary | ICD-10-CM | POA: Diagnosis not present

## 2020-12-24 DIAGNOSIS — H11122 Conjunctival concretions, left eye: Secondary | ICD-10-CM | POA: Diagnosis not present

## 2020-12-24 DIAGNOSIS — H57813 Brow ptosis, bilateral: Secondary | ICD-10-CM | POA: Diagnosis not present

## 2020-12-30 ENCOUNTER — Other Ambulatory Visit: Payer: Self-pay

## 2020-12-30 ENCOUNTER — Ambulatory Visit: Payer: Medicare Other | Attending: Physical Medicine & Rehabilitation

## 2020-12-30 DIAGNOSIS — M546 Pain in thoracic spine: Secondary | ICD-10-CM | POA: Diagnosis not present

## 2020-12-30 DIAGNOSIS — M6281 Muscle weakness (generalized): Secondary | ICD-10-CM | POA: Diagnosis not present
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
## 2021-01-04 ENCOUNTER — Ambulatory Visit: Payer: Medicare Other

## 2021-01-04 DIAGNOSIS — M6281 Muscle weakness (generalized): Secondary | ICD-10-CM | POA: Diagnosis not present

## 2021-01-04 DIAGNOSIS — M546 Pain in thoracic spine: Secondary | ICD-10-CM | POA: Diagnosis not present
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
## 2021-01-08 DIAGNOSIS — G4733 Obstructive sleep apnea (adult) (pediatric): Secondary | ICD-10-CM | POA: Diagnosis not present

## 2021-01-08 DIAGNOSIS — J9610 Chronic respiratory failure, unspecified whether with hypoxia or hypercapnia: Secondary | ICD-10-CM | POA: Diagnosis not present

## 2021-01-11 ENCOUNTER — Other Ambulatory Visit: Payer: Self-pay | Admitting: Family Medicine

## 2021-01-26 ENCOUNTER — Encounter: Payer: Medicare Other | Attending: Physical Medicine & Rehabilitation | Admitting: Registered Nurse

## 2021-01-26 ENCOUNTER — Other Ambulatory Visit: Payer: Self-pay

## 2021-01-26 VITALS — BP 125/73 | HR 61 | Temp 98.7°F | Ht 65.0 in | Wt 217.0 lb

## 2021-01-26 DIAGNOSIS — G8929 Other chronic pain: Secondary | ICD-10-CM | POA: Diagnosis not present

## 2021-01-26 DIAGNOSIS — Z5181 Encounter for therapeutic drug level monitoring: Secondary | ICD-10-CM | POA: Diagnosis not present

## 2021-01-26 DIAGNOSIS — Z79891 Long term (current) use of opiate analgesic: Secondary | ICD-10-CM | POA: Insufficient documentation

## 2021-01-26 DIAGNOSIS — G894 Chronic pain syndrome: Secondary | ICD-10-CM | POA: Insufficient documentation

## 2021-01-26 DIAGNOSIS — M546 Pain in thoracic spine: Secondary | ICD-10-CM | POA: Diagnosis not present

## 2021-01-26 DIAGNOSIS — M62830 Muscle spasm of back: Secondary | ICD-10-CM | POA: Diagnosis not present

## 2021-01-26 MED ORDER — MORPHINE SULFATE ER 15 MG PO TBCR: 15.0000 mg | EXTENDED_RELEASE_TABLET | Freq: Three times a day (TID) | ORAL | 0 refills | Status: DC

## 2021-01-26 NOTE — Progress Notes (Signed)
Subjective:    Patient ID: [name], female    DOB: [birth year], 70 y.o.   MRN: [id number]  HPI: [name] is a 70 y.o. female who returns for follow up appointment for chronic pain and medication refill. She states her  pain is located in her mid-back. She rates her pain 3. Her current exercise regime is walking and performing stretching exercises.  [name] Morphine equivalent is  45.00  MME.   Last UDS was performed on 10/14/2020, it was consistent.     Pain Inventory Average Pain 4 Pain Right Now 3 My pain is constant, burning, and aching  In the last 24 hours, has pain interfered with the following? General activity 5 Relation with others 5 Enjoyment of life 4 What TIME of day is your pain at its worst? evening Sleep (in general) Good  Pain is worse with: walking, standing, and some activites Pain improves with: rest, heat/ice, pacing activities, and medication Relief from Meds: 5  Family History  Problem Relation Age of Onset   Breast cancer Mother    Diabetes Mother    Heart disease Mother    Allergies Mother    Diabetes Father    Heart disease Father    Emphysema Father    Allergies Father    Stomach cancer Maternal Uncle    Breast cancer Paternal Aunt    Colon cancer Neg Hx    Social History   Socioeconomic History   Marital status: Married    Spouse name: Not on file   Number of children: Not on file   Years of education: Not on file   Highest education level: Not on file  Occupational History   Occupation: Radiation protection practitioner   Occupation: retired  Tobacco Use   Smoking status: Never   Smokeless tobacco: Never  Vaping Use   Vaping Use: Never used  Substance and Sexual Activity   Alcohol use: Yes    Alcohol/week: 1.0 standard drink    Types: 1 Glasses of wine per week    Comment: rare   Drug use: Never   Sexual activity: Not Currently  Other Topics Concern   Not on file  Social History Narrative   Not on file   Social Determinants of  Health   Financial Resource Strain: Low Risk    Difficulty of Paying Living Expenses: Not very hard  Food Insecurity: No Food Insecurity   Worried About Running Out of Food in the Last Year: Never true   Ran Out of Food in the Last Year: Never true  Transportation Needs: No Transportation Needs   Lack of Transportation (Medical): No   Lack of Transportation (Non-Medical): No  Physical Activity: Inactive   Days of Exercise per Week: 0 days   Minutes of Exercise per Session: 0 min  Stress: No Stress Concern Present   Feeling of Stress : Not at all  Social Connections: Not on file   Past Surgical History:  Procedure Laterality Date   29 HOUR [place] STUDY N/A 01/04/2016   Procedure: [place];  Surgeon: Manus Gunning, MD;  Location: [facility];  Service: Gastroenterology;  Laterality: N/A;   ABDOMINAL HYSTERECTOMY  1988   ADENOIDECTOMY     ANTERIOR CERVICAL DECOMP/DISCECTOMY FUSION  2009, 1993   first procedure in ~ 1993/Dr [place] N/A 04/03/2019   Procedure: CARDIOVERSION;  Surgeon: Dorothy Spark, MD;  Location: [facility]  ENDOSCOPY;  Service: Cardiovascular;  Laterality: N/A;   CHOLECYSTECTOMY  2005   ESOPHAGEAL MANOMETRY N/A 01/04/2016   Procedure: ESOPHAGEAL MANOMETRY (EM) 24 HR PH;  Surgeon: Manus Gunning, MD;  [facility];  Service: Gastroenterology;  Laterality: N/A;   ESOPHAGOGASTRODUODENOSCOPY (EGD) WITH PROPOFOL N/A 05/17/2016   Procedure: ESOPHAGOGASTRODUODENOSCOPY (EGD) WITH PROPOFOL;  Surgeon: Manus Gunning, MD;  [facility];  Service: Gastroenterology;  Laterality: N/A;   NASAL SINUS SURGERY  2006   SAVORY DILATION N/A 05/17/2016   Procedure: SAVORY DILATION;  Surgeon: Manus Gunning, MD;  [facility];  Service: Gastroenterology;  Laterality: N/A;   TEE WITHOUT CARDIOVERSION N/A 04/03/2019   Procedure: TRANSESOPHAGEAL ECHOCARDIOGRAM (TEE)-with DCCV;   Surgeon: Dorothy Spark, MD;  Location: [facility] ENDOSCOPY;; EF 55 to 60%.  No LVH.  Normal RV.  Moderate LA dilation.  No thrombus.  Mild RA dilation.  Mild to moderate TR.  Mild aortic valve sclerosis, no stenosis.  Moderately elevated PA pressures (47 mmHg)   TONSILLECTOMY AND ADENOIDECTOMY     TRANSTHORACIC ECHOCARDIOGRAM  2009   Normal LV size and function. EF 60-65%. No regional wall motion and amount is. Mild aortic sclerosis, no stenosis   TUBAL LIGATION  1979   Past Surgical History:  Procedure Laterality Date   3 HOUR [place] STUDY N/A 01/04/2016   Procedure: 24 HOUR [place] STUDY;  Surgeon: Manus Gunning, MD;  [facility];  Service: Gastroenterology;  Laterality: N/A;   ABDOMINAL HYSTERECTOMY  1988   ADENOIDECTOMY     ANTERIOR CERVICAL DECOMP/DISCECTOMY FUSION  2009, 1993   first procedure in ~ 1993/Dr [place] N/A 04/03/2019   Procedure: CARDIOVERSION;  Surgeon: Dorothy Spark, MD;  Location: [place];  Service: Cardiovascular;  Laterality: N/A;   CHOLECYSTECTOMY  2005   ESOPHAGEAL MANOMETRY N/A 01/04/2016   Procedure: ESOPHAGEAL MANOMETRY (EM) 24 HR PH;  Surgeon: Manus Gunning, MD;  [facility];  Service: Gastroenterology;  Laterality: N/A;   ESOPHAGOGASTRODUODENOSCOPY (EGD) WITH PROPOFOL N/A 05/17/2016   Procedure: ESOPHAGOGASTRODUODENOSCOPY (EGD) WITH PROPOFOL;  Surgeon: Manus Gunning, MD;  [facility];  Service: Gastroenterology;  Laterality: N/A;   NASAL SINUS SURGERY  2006   SAVORY DILATION N/A 05/17/2016   Procedure: SAVORY DILATION;  Surgeon: Manus Gunning, MD;  [facility];  Service: Gastroenterology;  Laterality: N/A;   TEE WITHOUT CARDIOVERSION N/A 04/03/2019   Procedure: TRANSESOPHAGEAL ECHOCARDIOGRAM (TEE)-with DCCV;  Surgeon: Dorothy Spark, MD;  Location: [facility] ENDOSCOPY;; EF 55 to 60%.  No LVH.  Normal RV.  Moderate LA dilation.  No  thrombus.  Mild RA dilation.  Mild to moderate TR.  Mild aortic valve sclerosis, no stenosis.  Moderately elevated PA pressures (47 mmHg)   TONSILLECTOMY AND ADENOIDECTOMY     TRANSTHORACIC ECHOCARDIOGRAM  2009   Normal LV size and function. EF 60-65%. No regional wall motion and amount is. Mild aortic sclerosis, no stenosis   TUBAL LIGATION  1979   Past Medical History:  Diagnosis Date   B12 deficiency    Complication of anesthesia    slow to wake up    Depression    Diabetes mellitus type 2, diet-controlled ([place]) 07/24/2018   Fibromyalgia    Generalized hyperhidrosis 04/17/2014   GERD (gastroesophageal reflux disease)    History of rheumatic fever as a child    Hyperlipemia    Hypertension    denies htn  on 05/09/16   HYPOTHYROIDISM 05/29/2007   Irritable bowel syndrome    Migraines    hx of    OSA (obstructive sleep apnea) 06/26/2019   Senile calcific aortic valve sclerosis    Aortic Sclerosis-no stenosis   BP 125/73   Pulse 61   Temp 98.7 F (37.1 C) (Oral)   Ht '5\' 5"'$  (1.651 m)   Wt 217 lb (98.4 kg)   SpO2 97%   BMI 36.11 kg/m   Opioid Risk Score:   Fall Risk Score:  `1  Depression screen PHQ 2/9  Depression screen [facility] 2/9 10/27/2020 08/12/2020 06/11/2020 02/12/2020 12/18/2019 06/19/2019 01/18/2019  Decreased Interest 1 0 0 0 0 1 0  Down, Depressed, Hopeless 1 0 - 0 0 1 0  PHQ - 2 Score 2 0 0 0 0 2 0  Altered sleeping - - - 0 0 - 0  Tired, decreased energy - - - 0 0 - 3  Change in appetite - - - 0 0 - 0  Feeling bad or failure about yourself  - - - 0 0 - 0  Trouble concentrating - - - 0 0 - 0  Moving slowly or fidgety/restless - - - 0 0 - 0  Suicidal thoughts - - - 0 0 - 0  PHQ-9 Score - - - 0 0 - 3  Difficult doing work/chores - - - Not difficult at all - - Not difficult at all  Some recent data might be hidden     Review of Systems  Constitutional: Negative.   HENT: Negative.    Eyes: Negative.   Respiratory: Negative.    Cardiovascular: Negative.    Gastrointestinal: Negative.   Endocrine: Negative.   Genitourinary: Negative.   Musculoskeletal:  Positive for back pain and gait problem.       PAIN IN BOTH KNEES   Skin: Negative.   Allergic/Immunologic: Negative.   Hematological: Negative.   Psychiatric/Behavioral: Negative.        Objective:   Physical Exam Vitals and nursing note reviewed.  Constitutional:      Appearance: Normal appearance.  Cardiovascular:     Rate and Rhythm: Normal rate and regular rhythm.     Pulses: Normal pulses.     Heart sounds: Normal heart sounds.  Pulmonary:     Effort: Pulmonary effort is normal.     Breath sounds: Normal breath sounds.  Musculoskeletal:     Cervical back: Normal range of motion and neck supple.     Comments: Normal Muscle Bulk and Muscle Testing Reveals:  Upper Extremities: Full ROM and Muscle Strength 5/5 Thoracic Paraspinal Tenderness: T-7-T-9 Lower Extremities: Full ROM and Muscle Strength 5/5 Arises from chair with ease Narrow Based  Gait     Skin:    General: Skin is warm and dry.  Neurological:     Mental Status: She is alert and oriented to person, place, and time.  Psychiatric:        Mood and Affect: Mood normal.        Behavior: Behavior normal.         Assessment & Plan:  1. Cervical postlaminectomy syndrome:/ Cervicalgia/ Cervical Radiculitis/ with neck and shoulder pain/ Chronic Midline Thoracic Pain.  Continue exercise routine and using heat therapy. 01/26/2021 Continue : current medication regimen. Refilled: MS Contin 15 mg one tablet three times a day #90.   We will continue the opioid monitoring program, this consists of regular clinic visits, examinations, urine drug screen, pill counts as well  as use of [place] Controlled Substance Reporting system. A 12 month History has been reviewed on the [place] on 01/26/2021.   2. Fibromyalgia/ Neuropathy: Continue current medication regimen with  Gabapentin, activity and exercise regime. 01/26/2021. 3. Bilateral Knee Pain/ Mild Degenerative Changes:  Continue current medication regimen with Voltaren Gel. 01/26/2021. 4. Bilateral  Ankle Pain:  No complaints today. Continue HEP as tolerated. Continue to monitor.01/26/2021. 5. Muscle Spasm: Continue current medication regimen with  Robaxin. Continue to Monitor.01/26/2021.. 6. Opioid Induced Constipation: No complaints Continue to Monitor. 01/26/2021. 7. Midline Low Back Pain: Continue current medication regime, continue HEP as tolerated. 01/26/2021. 8. Right Gluteal Medius Weakness and Atrophy: No complaints Today:Continue with Hip Abduction Exercises as tolerated: Continue to Monitor. 01/26/2021. 9. Right Hip Pain/ Right Greater Trochanteric Tenderness: No complaints Today. Continue to Alternate Ice and Heat Therapy. Continue to Monitor. 01/26/2021  10. Sacroiliac Joint Pain: No complaints today. S/P Sacroiliac Injection on 08/24/2018 with good relief noted. 01/26/2021 11. Lumbar Radiculitis: No complaints today.Continue Gabapentin. Continue HEP as tolerated. Continue current medication regimen. Continue to monitor.  01/26/2021.  F/U in 1 month

## 2021-01-27 ENCOUNTER — Encounter: Payer: Self-pay | Admitting: Registered Nurse

## 2021-02-01 NOTE — Telephone Encounter (Signed)
Please reschedule [place], CPE and Lab appointments from September to December 2022.

## 2021-02-02 DIAGNOSIS — D2310 Other benign neoplasm of skin of unspecified eyelid, including canthus: Secondary | ICD-10-CM | POA: Diagnosis not present

## 2021-02-02 DIAGNOSIS — H11122 Conjunctival concretions, left eye: Secondary | ICD-10-CM | POA: Diagnosis not present

## 2021-02-02 DIAGNOSIS — H019 Unspecified inflammation of eyelid: Secondary | ICD-10-CM | POA: Diagnosis not present

## 2021-02-05 ENCOUNTER — Other Ambulatory Visit: Payer: Self-pay | Admitting: Family Medicine

## 2021-02-08 DIAGNOSIS — J9610 Chronic respiratory failure, unspecified whether with hypoxia or hypercapnia: Secondary | ICD-10-CM | POA: Diagnosis not present

## 2021-02-08 DIAGNOSIS — G4733 Obstructive sleep apnea (adult) (pediatric): Secondary | ICD-10-CM | POA: Diagnosis not present

## 2021-03-01 ENCOUNTER — Other Ambulatory Visit: Payer: Self-pay | Admitting: Family Medicine

## 2021-03-10 ENCOUNTER — Encounter: Payer: Medicare Other | Attending: Physical Medicine & Rehabilitation | Admitting: Registered Nurse

## 2021-03-10 ENCOUNTER — Encounter: Payer: Self-pay | Admitting: Registered Nurse

## 2021-03-10 ENCOUNTER — Ambulatory Visit: Payer: Medicare Other | Admitting: Registered Nurse

## 2021-03-10 ENCOUNTER — Other Ambulatory Visit: Payer: Self-pay

## 2021-03-10 ENCOUNTER — Encounter: Payer: Medicare Other | Admitting: Registered Nurse

## 2021-03-10 VITALS — BP 124/71 | HR 70 | Temp 98.5°F | Ht 65.0 in | Wt 217.0 lb

## 2021-03-10 DIAGNOSIS — G8929 Other chronic pain: Secondary | ICD-10-CM | POA: Insufficient documentation

## 2021-03-10 DIAGNOSIS — M62830 Muscle spasm of back: Secondary | ICD-10-CM | POA: Insufficient documentation

## 2021-03-10 DIAGNOSIS — G894 Chronic pain syndrome: Secondary | ICD-10-CM | POA: Diagnosis not present

## 2021-03-10 DIAGNOSIS — Z79891 Long term (current) use of opiate analgesic: Secondary | ICD-10-CM | POA: Insufficient documentation

## 2021-03-10 DIAGNOSIS — M546 Pain in thoracic spine: Secondary | ICD-10-CM | POA: Insufficient documentation

## 2021-03-10 DIAGNOSIS — Z5181 Encounter for therapeutic drug level monitoring: Secondary | ICD-10-CM | POA: Insufficient documentation

## 2021-03-10 MED ORDER — MORPHINE SULFATE ER 15 MG PO TBCR: 15.0000 mg | EXTENDED_RELEASE_TABLET | Freq: Three times a day (TID) | ORAL | 0 refills | Status: DC

## 2021-03-10 NOTE — Progress Notes (Addendum)
Subjective:    Patient ID: [name], female    DOB: May 08, 1951, 70 y.o.   MRN: [id number]  HPI: [name] is a 70 y.o. female who is scheduled for a telephone visit, she is caring for her husband who recently had surgery. [name] agrees with telephone visit.  She states her pain is located in her mid- back and bilateral knee pain. She rates her pain 4. Her current exercise regime is walking and performing stretching exercises.  [name] Morphine equivalent is 45.00  MME.   Last UDS was Performed on 10/14/2020, it was consistent.     Pain Inventory Average Pain 4 Pain Right Now 4 My pain is aching  In the last 24 hours, has pain interfered with the following? General activity 0 Relation with others 0 Enjoyment of life 0 What TIME of day is your pain at its worst? morning , daytime, evening, and night Sleep (in general) Fair  Pain is worse with: walking, standing, and some activites Pain improves with: rest, heat/ice, and medication Relief from Meds: 5  Family History  Problem Relation Age of Onset   Breast cancer Mother    Diabetes Mother    Heart disease Mother    Allergies Mother    Diabetes Father    Heart disease Father    Emphysema Father    Allergies Father    Stomach cancer Maternal Uncle    Breast cancer Paternal Aunt    Colon cancer Neg Hx    Social History   Socioeconomic History   Marital status: Married    Spouse name: Not on file   Number of children: Not on file   Years of education: Not on file   Highest education level: Not on file  Occupational History   Occupation: Radiation protection practitioner   Occupation: retired  Tobacco Use   Smoking status: Never   Smokeless tobacco: Never  Vaping Use   Vaping Use: Never used  Substance and Sexual Activity   Alcohol use: Yes    Alcohol/week: 1.0 standard drink    Types: 1 Glasses of wine per week    Comment: rare   Drug use: Never   Sexual activity: Not Currently  Other Topics Concern   Not on file   Social History Narrative   Not on file   Social Determinants of Health   Financial Resource Strain: Low Risk    Difficulty of Paying Living Expenses: Not very hard  Food Insecurity: Not on file  Transportation Needs: Not on file  Physical Activity: Not on file  Stress: Not on file  Social Connections: Not on file   Past Surgical History:  Procedure Laterality Date   38 HOUR [place] STUDY N/A 01/04/2016   Procedure: [place];  Surgeon: Manus Gunning, MD;  Location: [facility];  Service: Gastroenterology;  Laterality: N/A;   ABDOMINAL HYSTERECTOMY  1988   ADENOIDECTOMY     ANTERIOR CERVICAL DECOMP/DISCECTOMY FUSION  2009, 1993   first procedure in ~ 1993/Dr [place] N/A 04/03/2019   Procedure: CARDIOVERSION;  Surgeon: Dorothy Spark, MD;  Location: [place];  Service: Cardiovascular;  Laterality: N/A;   CHOLECYSTECTOMY  2005   ESOPHAGEAL MANOMETRY N/A 01/04/2016   Procedure: ESOPHAGEAL MANOMETRY (EM) 24 HR PH;  Surgeon: Manus Gunning, MD;  Location: [facility];  Service: Gastroenterology;  Laterality: N/A;   ESOPHAGOGASTRODUODENOSCOPY (EGD) WITH PROPOFOL N/A 05/17/2016  Procedure: ESOPHAGOGASTRODUODENOSCOPY (EGD) WITH PROPOFOL;  Surgeon: Manus Gunning, MD;  Location: [facility];  Service: Gastroenterology;  Laterality: N/A;   NASAL SINUS SURGERY  2006   SAVORY DILATION N/A 05/17/2016   Procedure: SAVORY DILATION;  Surgeon: Manus Gunning, MD;  [facility]: [facility];  Service: Gastroenterology;  Laterality: N/A;   TEE WITHOUT CARDIOVERSION N/A 04/03/2019   Procedure: TRANSESOPHAGEAL ECHOCARDIOGRAM (TEE)-with DCCV;  Surgeon: Dorothy Spark, MD;  Location: [facility] ENDOSCOPY;; EF 55 to 60%.  No LVH.  Normal RV.  Moderate LA dilation.  No thrombus.  Mild RA dilation.  Mild to moderate TR.  Mild aortic valve sclerosis, no stenosis.  Moderately elevated PA pressures (47 mmHg)    TONSILLECTOMY AND ADENOIDECTOMY     TRANSTHORACIC ECHOCARDIOGRAM  2009   Normal LV size and function. EF 60-65%. No regional wall motion and amount is. Mild aortic sclerosis, no stenosis   TUBAL LIGATION  1979   Past Surgical History:  Procedure Laterality Date   50 HOUR [place] STUDY N/A 01/04/2016   Procedure: 24 HOUR PH STUDY;  Surgeon: Manus Gunning, MD;  [facility]: [facility];  Service: Gastroenterology;  Laterality: N/A;   ABDOMINAL HYSTERECTOMY  1988   ADENOIDECTOMY     ANTERIOR CERVICAL DECOMP/DISCECTOMY FUSION  2009, 1993   first procedure in ~ 1993/Dr [place] N/A 04/03/2019   Procedure: CARDIOVERSION;  Surgeon: Dorothy Spark, MD;  Location: [place];  Service: Cardiovascular;  Laterality: N/A;   CHOLECYSTECTOMY  2005   ESOPHAGEAL MANOMETRY N/A 01/04/2016   Procedure: ESOPHAGEAL MANOMETRY (EM) 24 HR PH;  Surgeon: Manus Gunning, MD;  [facility];  Service: Gastroenterology;  Laterality: N/A;   ESOPHAGOGASTRODUODENOSCOPY (EGD) WITH PROPOFOL N/A 05/17/2016   Procedure: ESOPHAGOGASTRODUODENOSCOPY (EGD) WITH PROPOFOL;  Surgeon: Manus Gunning, MD;  [facility];  Service: Gastroenterology;  Laterality: N/A;   NASAL SINUS SURGERY  2006   SAVORY DILATION N/A 05/17/2016   Procedure: SAVORY DILATION;  Surgeon: Manus Gunning, MD;  Location: [facility];  Service: Gastroenterology;  Laterality: N/A;   TEE WITHOUT CARDIOVERSION N/A 04/03/2019   Procedure: TRANSESOPHAGEAL ECHOCARDIOGRAM (TEE)-with DCCV;  Surgeon: Dorothy Spark, MD;  Location: [facility] ENDOSCOPY;; EF 55 to 60%.  No LVH.  Normal RV.  Moderate LA dilation.  No thrombus.  Mild RA dilation.  Mild to moderate TR.  Mild aortic valve sclerosis, no stenosis.  Moderately elevated PA pressures (47 mmHg)   TONSILLECTOMY AND ADENOIDECTOMY     TRANSTHORACIC ECHOCARDIOGRAM  2009   Normal LV size and function. EF 60-65%. No regional  wall motion and amount is. Mild aortic sclerosis, no stenosis   TUBAL LIGATION  1979   Past Medical History:  Diagnosis Date   B12 deficiency    Complication of anesthesia    slow to wake up    Depression    Diabetes mellitus type 2, diet-controlled ([place]) 07/24/2018   Fibromyalgia    Generalized hyperhidrosis 04/17/2014   GERD (gastroesophageal reflux disease)    History of rheumatic fever as a child    Hyperlipemia    Hypertension    denies htn on 05/09/16   HYPOTHYROIDISM 05/29/2007   Irritable bowel syndrome    Migraines    hx of    OSA (obstructive sleep apnea) 06/26/2019   Senile calcific aortic valve sclerosis    Aortic Sclerosis-no stenosis   BP 124/71   Pulse 70   Temp 98.5  F (36.9 C)   Ht '5\' 5"'$  (1.651 m)   Wt 217 lb (98.4 kg) Comment: last recorded  SpO2 97%   BMI 36.11 kg/m   Opioid Risk Score:   Fall Risk Score:  `1  Depression screen PHQ 2/9  Depression screen [facility] 2/9 01/26/2021 10/27/2020 08/12/2020 06/11/2020 02/12/2020 12/18/2019 06/19/2019  Decreased Interest 1 1 0 0 0 0 1  Down, Depressed, Hopeless 1 1 0 - 0 0 1  PHQ - 2 Score 2 2 0 0 0 0 2  Altered sleeping - - - - 0 0 -  Tired, decreased energy - - - - 0 0 -  Change in appetite - - - - 0 0 -  Feeling bad or failure about yourself  - - - - 0 0 -  Trouble concentrating - - - - 0 0 -  Moving slowly or fidgety/restless - - - - 0 0 -  Suicidal thoughts - - - - 0 0 -  PHQ-9 Score - - - - 0 0 -  Difficult doing work/chores - - - - Not difficult at all - -  Some recent data might be hidden     Review of Systems  Constitutional: Negative.   HENT: Negative.    Eyes: Negative.   Respiratory: Negative.    Cardiovascular: Negative.   Gastrointestinal: Negative.   Endocrine: Negative.   Genitourinary: Negative.   Musculoskeletal:  Positive for arthralgias and back pain.  Skin: Negative.   Hematological:  Bruises/bleeds easily.       Eliquis  Psychiatric/Behavioral: Negative.    All other systems  reviewed and are negative.     Objective:   Physical Exam Vitals and nursing note reviewed.  Musculoskeletal:     Comments: No Physical Exam Performed: Telephone Visit         Assessment & Plan:  1. Cervical postlaminectomy syndrome:/ Cervicalgia/ Cervical Radiculitis/ with neck and shoulder pain/ Chronic Midline Thoracic Pain.  Continue exercise routine and using heat therapy. 03/10/2021 Continue : current medication regimen. Refilled: MS Contin 15 mg one tablet three times a day #90.   We will continue the opioid monitoring program, this consists of regular clinic visits, examinations, urine drug screen, pill counts as well as use of [place] Controlled Substance Reporting system. A 12 month History has been reviewed on the [place] on 03/10/2021.   2. Fibromyalgia/ Neuropathy: Continue current medication regimen with Gabapentin, activity and exercise regime. 09/14 /2022. 3. Bilateral Knee Pain/ Mild Degenerative Changes:  Continue current medication regimen with Voltaren Gel. 03/10/2021. 4. Bilateral  Ankle Pain:  No complaints today. Continue HEP as tolerated. Continue to monitor.03/10/2021. 5. Muscle Spasm: Continue current medication regimen with  Robaxin. Continue to Monitor.03/10/2021.. 6. Opioid Induced Constipation: No complaints Continue to Monitor. 03/10/2021. 7. Midline Low Back Pain: Continue current medication regime, continue HEP as tolerated. 03/10/2021. 8. Right Gluteal Medius Weakness and Atrophy: No complaints Today:Continue with Hip Abduction Exercises as tolerated: Continue to Monitor. 03/10/2021. 9. Right Hip Pain/ Right Greater Trochanteric Tenderness: No complaints Today. Continue to Alternate Ice and Heat Therapy. Continue to Monitor. 03/10/2021  10. Sacroiliac Joint Pain: No complaints today. S/P Sacroiliac Injection on 08/24/2018 with good relief noted. 03/10/2021 11. Lumbar Radiculitis: No complaints  today.Continue Gabapentin. Continue HEP as tolerated. Continue current medication regimen. Continue to monitor.  03/10/2021.   F/U in 1 month  Telephone Visit Established Patient Location of Patient: In her Home Location of Provider: In the Office  Total Time Spent: 10 Minutes

## 2021-03-11 DIAGNOSIS — J9610 Chronic respiratory failure, unspecified whether with hypoxia or hypercapnia: Secondary | ICD-10-CM | POA: Diagnosis not present

## 2021-03-11 DIAGNOSIS — G4733 Obstructive sleep apnea (adult) (pediatric): Secondary | ICD-10-CM | POA: Diagnosis not present

## 2021-03-17 ENCOUNTER — Ambulatory Visit: Payer: Medicare Other

## 2021-03-17 ENCOUNTER — Other Ambulatory Visit: Payer: Medicare Other

## 2021-03-24 ENCOUNTER — Encounter: Payer: Medicare Other | Admitting: Family Medicine

## 2021-03-29 ENCOUNTER — Telehealth: Payer: Medicare Other | Admitting: Registered Nurse

## 2021-04-06 ENCOUNTER — Encounter: Payer: Self-pay | Admitting: Registered Nurse

## 2021-04-06 ENCOUNTER — Other Ambulatory Visit: Payer: Self-pay

## 2021-04-06 ENCOUNTER — Ambulatory Visit
Admission: RE | Admit: 2021-04-06 | Discharge: 2021-04-06 | Disposition: A | Discharge status: Home / Self Care | Payer: Medicare Other | Source: Ambulatory Visit | Attending: Registered Nurse | Admitting: Registered Nurse

## 2021-04-06 ENCOUNTER — Encounter: Payer: Medicare Other | Attending: Physical Medicine & Rehabilitation | Admitting: Registered Nurse

## 2021-04-06 VITALS — BP 125/76 | HR 65 | Temp 98.8°F | Ht 65.0 in | Wt 214.2 lb

## 2021-04-06 DIAGNOSIS — M546 Pain in thoracic spine: Secondary | ICD-10-CM | POA: Insufficient documentation

## 2021-04-06 DIAGNOSIS — Z79891 Long term (current) use of opiate analgesic: Secondary | ICD-10-CM

## 2021-04-06 DIAGNOSIS — G894 Chronic pain syndrome: Secondary | ICD-10-CM

## 2021-04-06 DIAGNOSIS — M62830 Muscle spasm of back: Secondary | ICD-10-CM | POA: Diagnosis not present

## 2021-04-06 DIAGNOSIS — M170 Bilateral primary osteoarthritis of knee: Secondary | ICD-10-CM

## 2021-04-06 DIAGNOSIS — G8929 Other chronic pain: Secondary | ICD-10-CM

## 2021-04-06 DIAGNOSIS — Z5181 Encounter for therapeutic drug level monitoring: Secondary | ICD-10-CM | POA: Diagnosis not present

## 2021-04-06 DIAGNOSIS — M25562 Pain in left knee: Secondary | ICD-10-CM

## 2021-04-06 MED ORDER — MORPHINE SULFATE ER 15 MG PO TBCR: 15.0000 mg | EXTENDED_RELEASE_TABLET | Freq: Three times a day (TID) | ORAL | 0 refills | Status: DC

## 2021-04-06 NOTE — Progress Notes (Signed)
Subjective:    Patient ID: [name], female    DOB: May 17, 1951, 70 y.o.   MRN: [id number]  HPI: [name] is a 70 y.o. female who returns for follow up appointment for chronic pain and medication refill. She states her pain is located in her mid- back  and left knee. She also reports increase intensity of left knee pain, she denies falling. We will order a left knee X-ray, she verbalizes understanding. She rates her pain 6. Her current exercise regime is walking and performing stretching exercises.  Mr. Mignone Morphine equivalent is 45.00   MME.   Last UDS was Performed on 10/14/2020, it was consistent.     Pain Inventory Average Pain 6 Pain Right Now 6 My pain is constant, sharp, burning, and aching  In the last 24 hours, has pain interfered with the following? General activity 7 Relation with others 6 Enjoyment of life 6 What TIME of day is your pain at its worst? daytime and evening Sleep (in general) Good  Pain is worse with: walking, standing, and some activites Pain improves with: rest, heat/ice, therapy/exercise, pacing activities, and medication Relief from Meds: 5  Family History  Problem Relation Age of Onset   Breast cancer Mother    Diabetes Mother    Heart disease Mother    Allergies Mother    Diabetes Father    Heart disease Father    Emphysema Father    Allergies Father    Stomach cancer Maternal Uncle    Breast cancer Paternal Aunt    Colon cancer Neg Hx    Social History   Socioeconomic History   Marital status: Married    Spouse name: Not on file   Number of children: Not on file   Years of education: Not on file   Highest education level: Not on file  Occupational History   Occupation: Radiation protection practitioner   Occupation: retired  Tobacco Use   Smoking status: Never   Smokeless tobacco: Never  Vaping Use   Vaping Use: Never used  Substance and Sexual Activity   Alcohol use: Yes    Alcohol/week: 1.0 standard drink    Types: 1 Glasses of wine  per week    Comment: rare   Drug use: Never   Sexual activity: Not Currently  Other Topics Concern   Not on file  Social History Narrative   Not on file   Social Determinants of Health   Financial Resource Strain: Low Risk    Difficulty of Paying Living Expenses: Not very hard  Food Insecurity: Not on file  Transportation Needs: Not on file  Physical Activity: Not on file  Stress: Not on file  Social Connections: Not on file   Past Surgical History:  Procedure Laterality Date   47 HOUR [place] STUDY N/A 01/04/2016   Procedure: [place];  Surgeon: Manus Gunning, MD;  Location: [facility];  Service: Gastroenterology;  Laterality: N/A;   ABDOMINAL HYSTERECTOMY  1988   ADENOIDECTOMY     ANTERIOR CERVICAL DECOMP/DISCECTOMY FUSION  2009, 1993   first procedure in ~ 1993/Dr [place] N/A 04/03/2019   Procedure: CARDIOVERSION;  Surgeon: Dorothy Spark, MD;  Location: [facility] ENDOSCOPY;  Service: Cardiovascular;  Laterality: N/A;   CHOLECYSTECTOMY  2005   ESOPHAGEAL MANOMETRY N/A 01/04/2016   Procedure: ESOPHAGEAL MANOMETRY (EM) 24 HR PH;  Surgeon: Manus Gunning, MD;  Location: [facility];  Service: Gastroenterology;  Laterality: N/A;   ESOPHAGOGASTRODUODENOSCOPY (EGD) WITH PROPOFOL N/A 05/17/2016   Procedure: ESOPHAGOGASTRODUODENOSCOPY (EGD) WITH PROPOFOL;  Surgeon: Manus Gunning, MD;  Location: [facility];  Service: Gastroenterology;  Laterality: N/A;   NASAL SINUS SURGERY  2006   SAVORY DILATION N/A 05/17/2016   Procedure: SAVORY DILATION;  Surgeon: Manus Gunning, MD;  Location: [facility];  Service: Gastroenterology;  Laterality: N/A;   TEE WITHOUT CARDIOVERSION N/A 04/03/2019   Procedure: TRANSESOPHAGEAL ECHOCARDIOGRAM (TEE)-with DCCV;  Surgeon: Dorothy Spark, MD;  Location: [facility] ENDOSCOPY;; EF 55 to 60%.  No LVH.  Normal RV.  Moderate LA dilation.  No thrombus.  Mild RA dilation.  Mild  to moderate TR.  Mild aortic valve sclerosis, no stenosis.  Moderately elevated PA pressures (47 mmHg)   TONSILLECTOMY AND ADENOIDECTOMY     TRANSTHORACIC ECHOCARDIOGRAM  2009   Normal LV size and function. EF 60-65%. No regional wall motion and amount is. Mild aortic sclerosis, no stenosis   TUBAL LIGATION  1979   Past Surgical History:  Procedure Laterality Date   70 HOUR [place] STUDY N/A 01/04/2016   Procedure: 24 HOUR [place] STUDY;  Surgeon: Manus Gunning, MD;  Location: [facility];  Service: Gastroenterology;  Laterality: N/A;   ABDOMINAL HYSTERECTOMY  1988   ADENOIDECTOMY     ANTERIOR CERVICAL DECOMP/DISCECTOMY FUSION  2009, 1993   first procedure in ~ 1993/Dr [place] N/A 04/03/2019   Procedure: CARDIOVERSION;  Surgeon: Dorothy Spark, MD;  Location: [place];  Service: Cardiovascular;  Laterality: N/A;   CHOLECYSTECTOMY  2005   ESOPHAGEAL MANOMETRY N/A 01/04/2016   Procedure: ESOPHAGEAL MANOMETRY (EM) 24 HR PH;  Surgeon: Manus Gunning, MD;  Location: [facility];  Service: Gastroenterology;  Laterality: N/A;   ESOPHAGOGASTRODUODENOSCOPY (EGD) WITH PROPOFOL N/A 05/17/2016   Procedure: ESOPHAGOGASTRODUODENOSCOPY (EGD) WITH PROPOFOL;  Surgeon: Manus Gunning, MD;  Location: [facility];  Service: Gastroenterology;  Laterality: N/A;   NASAL SINUS SURGERY  2006   SAVORY DILATION N/A 05/17/2016   Procedure: SAVORY DILATION;  Surgeon: Manus Gunning, MD;  Location: [facility];  Service: Gastroenterology;  Laterality: N/A;   TEE WITHOUT CARDIOVERSION N/A 04/03/2019   Procedure: TRANSESOPHAGEAL ECHOCARDIOGRAM (TEE)-with DCCV;  Surgeon: Dorothy Spark, MD;  Location: [facility] ENDOSCOPY;; EF 55 to 60%.  No LVH.  Normal RV.  Moderate LA dilation.  No thrombus.  Mild RA dilation.  Mild to moderate TR.  Mild aortic valve sclerosis, no stenosis.  Moderately elevated PA pressures (47 mmHg)   TONSILLECTOMY AND  ADENOIDECTOMY     TRANSTHORACIC ECHOCARDIOGRAM  2009   Normal LV size and function. EF 60-65%. No regional wall motion and amount is. Mild aortic sclerosis, no stenosis   TUBAL LIGATION  1979   Past Medical History:  Diagnosis Date   B12 deficiency    Complication of anesthesia    slow to wake up    Depression    Diabetes mellitus type 2, diet-controlled ([place]) 07/24/2018   Fibromyalgia    Generalized hyperhidrosis 04/17/2014   GERD (gastroesophageal reflux disease)    History of rheumatic fever as a child    Hyperlipemia    Hypertension    denies htn on 05/09/16   HYPOTHYROIDISM 05/29/2007   Irritable bowel syndrome    Migraines    hx of    OSA (obstructive sleep apnea) 06/26/2019   Senile calcific aortic valve sclerosis  Aortic Sclerosis-no stenosis   BP 125/76   Pulse 65   Temp 98.8 F (37.1 C) (Oral)   Ht 5\' 5"  (1.651 m)   Wt 214 lb 3.2 oz (97.2 kg)   SpO2 96%   BMI 35.64 kg/m   Opioid Risk Score:   Fall Risk Score:  `1  Depression screen PHQ 2/9  Depression screen [facility] 2/9 01/26/2021 10/27/2020 08/12/2020 06/11/2020 02/12/2020 12/18/2019 06/19/2019  Decreased Interest 1 1 0 0 0 0 1  Down, Depressed, Hopeless 1 1 0 - 0 0 1  PHQ - 2 Score 2 2 0 0 0 0 2  Altered sleeping - - - - 0 0 -  Tired, decreased energy - - - - 0 0 -  Change in appetite - - - - 0 0 -  Feeling bad or failure about yourself  - - - - 0 0 -  Trouble concentrating - - - - 0 0 -  Moving slowly or fidgety/restless - - - - 0 0 -  Suicidal thoughts - - - - 0 0 -  PHQ-9 Score - - - - 0 0 -  Difficult doing work/chores - - - - Not difficult at all - -  Some recent data might be hidden     Review of Systems  Musculoskeletal:  Positive for back pain and gait problem.       Bilateral knee pain  All other systems reviewed and are negative.     Objective:   Physical Exam Vitals and nursing note reviewed.  Constitutional:      Appearance: Normal appearance.  Cardiovascular:     Rate and Rhythm:  Normal rate and regular rhythm.     Pulses: Normal pulses.     Heart sounds: Normal heart sounds.  Pulmonary:     Effort: Pulmonary effort is normal.     Breath sounds: Normal breath sounds.  Musculoskeletal:     Cervical back: Normal range of motion and neck supple.     Right lower leg: Edema present.     Left lower leg: Edema present.     Comments: Normal Muscle Bulk and Muscle Testing Reveals:  Upper Extremities: Full ROM and Muscle Strength 5/5  Thoracic Paraspinal Tenderness: T-7-T-9 Lower Extremities: Full ROM and Muscle Strength 5/5 Arises from chair with ease Narrow Based  Gait     Skin:    General: Skin is warm and dry.  Neurological:     Mental Status: She is alert and oriented to person, place, and time.  Psychiatric:        Mood and Affect: Mood normal.        Behavior: Behavior normal.         Assessment & Plan:  1. Cervical postlaminectomy syndrome:/ Cervicalgia/ Cervical Radiculitis/ with neck and shoulder pain/ Chronic Midline Thoracic Pain.  Continue exercise routine and using heat therapy. 04/06/2021 Continue : current medication regimen. Refilled: MS Contin 15 mg one tablet three times a day #90.   We will continue the opioid monitoring program, this consists of regular clinic visits, examinations, urine drug screen, pill counts as well as use of [place] Controlled Substance Reporting system. A 12 month History has been reviewed on the [place] Controlled Substance Reporting System on 04/06/2021.   2. Fibromyalgia/ Neuropathy: Continue current medication regimen with Gabapentin, activity and exercise regime. 10/11 /2022. 3. Bilateral Knee Pain/ Mild Degenerative Changes:  Continue current medication regimen with Voltaren Gel. 04/06/2021. 4. Bilateral  Ankle Pain:  No  complaints today. Continue HEP as tolerated. Continue to monitor.04/06/2021. 5. Muscle Spasm: Continue current medication regimen with  Robaxin. Continue to Monitor.04/06/2021.. 6.  Opioid Induced Constipation: No complaints Continue to Monitor. 04/06/2021. 7. Midline Low Back Pain: Continue current medication regime, continue HEP as tolerated. 04/06/2021. 8. Right Gluteal Medius Weakness and Atrophy: No complaints Today:Continue with Hip Abduction Exercises as tolerated: Continue to Monitor. 04/06/2021. 9. Right Hip Pain/ Right Greater Trochanteric Tenderness: No complaints Today. Continue to Alternate Ice and Heat Therapy. Continue to Monitor. 04/06/2021  10. Sacroiliac Joint Pain: No complaints today. S/P Sacroiliac Injection on 08/24/2018 with good relief noted. 04/06/2021 11. Lumbar Radiculitis: No complaints today.Continue Gabapentin. Continue HEP as tolerated. Continue current medication regimen. Continue to monitor.  04/06/2021. 12. Left Knee Pain: RX: Left Knee Xray   F/U in 1 month

## 2021-04-08 ENCOUNTER — Telehealth: Payer: Self-pay

## 2021-04-08 ENCOUNTER — Other Ambulatory Visit: Payer: Self-pay | Admitting: Registered Nurse

## 2021-04-08 NOTE — Progress Notes (Addendum)
Chronic Care Management Pharmacy Assistant   Name: [name]  MRN: [id number] DOB: [birth year]  Reason for Encounter: Diabetes Disease State   Recent office visits:  12/21/2020 - Owens Loffler, MD - Patient presented for diabetes. Stop: bisacodyl (DULCOLAX) 5 MG EC tablet.   Recent consult visits:  04/06/2021 - Physical Medicine and Rehabilition - Patient presented for Cervical postlaminectomy syndrome:/ Cervicalgia/ Cervical Radiculitis/ with neck and shoulder pain/ Chronic Midline Thoracic Pain.. No medication changes.Marland Kitchen  03/10/2021 - Physical Medicine and Rehabilition - Video Visit - Patient presented for Cervical postlaminectomy syndrome:/ Cervicalgia/ Cervical Radiculitis/ with neck and shoulder pain/ Chronic Midline Thoracic Pain.. No medication changes. 01/26/2021 - Physical Medicine and Rehabilition - Patient presented for Cervical postlaminectomy syndrome:/ Cervicalgia/ Cervical Radiculitis/ with neck and shoulder pain/ Chronic Midline Thoracic Pain.. No medication changes.  12/22/2020 - Physical Medicine and Rehabilitation - Patient presented for thoracic pain history of thoracic degenerative disc. No medication changes.  01/04/21, 12/30/20, 12/23/20, 12/21/20, 12/16/20, 12/14/20, 12/09/20, 12/07/20, 12/02/20 -  Physical Therapy - Patient presented for PT for pain in thoracic spine.   Hospital visits:  None in previous 6 months  Medications: Outpatient Encounter Medications as of 04/08/2021  Medication Sig   acetaminophen (TYLENOL) 650 MG CR tablet Take 650 mg by mouth in the morning and at bedtime.   B Complex Vitamins (B COMPLEX 100 PO) Take 1 tablet by mouth every morning.    Calcium Carbonate-Vitamin D (CALCIUM 600+D PO) Take 1 tablet by mouth in the morning and at bedtime.    carboxymethylcellulose (REFRESH PLUS) 0.5 % SOLN Place 1 drop into both eyes 3 (three) times daily as needed (dry eyes).    cetirizine (ZYRTEC) 10 MG tablet Take 10 mg by mouth at bedtime.     Cholecalciferol (VITAMIN D) 125 MCG (5000 UT) CAPS Take 5,000 Units by mouth every morning.    clindamycin (CLEOCIN T) 1 % lotion Apply 1 application topically daily as needed (eczema).    Coenzyme Q-10 100 MG capsule Take 100 mg by mouth every morning.    diclofenac sodium (VOLTAREN) 1 % GEL Apply 4 g topically 4 (four) times daily as needed (joint pain).    diltiazem (CARDIZEM CD) 240 MG 24 hr capsule TAKE 1 CAPSULE BY MOUTH EVERY DAY   docusate sodium (COLACE) 250 MG capsule Take 250 mg by mouth at bedtime.   ELIQUIS 5 MG TABS tablet TAKE 1 TABLET BY MOUTH  TWICE DAILY   flecainide (TAMBOCOR) 50 MG tablet TAKE 1 TABLET BY MOUTH  TWICE DAILY   fluocinonide (LIDEX) 0.05 % external solution Apply 1 application topically daily as needed (eczema on scalp).    gabapentin (NEURONTIN) 300 MG capsule TAKE 1 CAPSULE BY MOUTH 3  TIMES DAILY   GRAPE SEED EXTRACT PO Take 100 mg by mouth 2 (two) times daily.    levothyroxine (SYNTHROID) 50 MCG tablet TAKE 1 TABLET BY MOUTH  DAILY   loratadine (CLARITIN) 10 MG tablet Take 10 mg by mouth every morning.    Magnesium Bisglycinate (MAG GLYCINATE PO) Take 400 mg by mouth at bedtime.    methocarbamol (ROBAXIN) 500 MG tablet Take 1 tablet (500 mg total) by mouth every 8 (eight) hours as needed for muscle spasms.   mirtazapine (REMERON) 15 MG tablet TAKE 1 TABLET BY MOUTH AT  BEDTIME   morphine (MS CONTIN) 15 MG 12 hr tablet Take 1 tablet (15 mg total) by mouth 3 (three) times daily. Do Not Fill Before 10/ 20/ 2022  oxybutynin (DITROPAN-XL) 10 MG 24 hr tablet Take 10 mg by mouth at bedtime.   oxymetazoline (AFRIN) 0.05 % nasal spray Place 1 spray into both nostrils 2 (two) times daily as needed for congestion.   RABEprazole (ACIPHEX) 20 MG tablet TAKE 1 TABLET BY MOUTH EVERY DAY   senna (SENOKOT) 8.6 MG TABS tablet Take 4 tablets by mouth at bedtime.   tretinoin (RETIN-A) 0.05 % cream Apply 1 application topically daily as needed (acne).    No  facility-administered encounter medications on file as of 04/08/2021.    Recent Relevant Labs: Lab Results  Component Value Date/Time   HGBA1C 7.0 (A) 12/21/2020 02:35 PM   HGBA1C 7.7 (H) 09/14/2020 02:37 PM   HGBA1C 7.6 (A) 06/15/2020 03:20 PM   HGBA1C 8.2 (H) 03/12/2020 01:25 PM   MICROALBUR 5.5 (H) 03/12/2020 01:47 PM   MICROALBUR <0.7 01/18/2019 02:07 PM    Kidney Function Lab Results  Component Value Date/Time   CREATININE 0.77 09/14/2020 02:37 PM   CREATININE 0.73 03/12/2020 01:25 PM   GFR 79.01 09/14/2020 02:37 PM   GFRNONAA >60 08/29/2019 04:15 PM   GFRAA >60 08/29/2019 04:15 PM   Attempted contact with patient 3 times on 10/13, 10/14 and 10/17. Unsuccessful outreach. Will attempt contact next month.   Current antihyperglycemic regimen:  No pharmacotherapy   Adherence Review: Is the patient currently on a STATIN medication? No Is the patient currently on ACE/ARB medication? No  Care Gaps: Annual wellness visit in last year? No Most recent A1C reading: 7.0 on 12/21/2020 Most Recent BP reading: 125/76 on 04/06/2021  Last eye exam / retinopathy screening: Up to date Last diabetic foot exam: 11/29/2017  Star Rating Drugs:  Medication:  Last Fill: Day Supply Flecainide 50mg  12/17/2020 90 Metformin 500mg   09/15/2020 90 Unable to verify fill dates as patient did not answer  Cardiology appointment on 04/27/2021 at [facility]  Debbora Dus, [place] notified  Marijean Niemann, [place] Assistant [phone]  I have reviewed the care management and care coordination activities outlined in this encounter and I am certifying that I agree with the content of this note. No further action required.  Debbora Dus, PharmD Clinical Pharmacist [facility] [phone]

## 2021-04-09 ENCOUNTER — Other Ambulatory Visit: Payer: Self-pay | Admitting: Physical Medicine & Rehabilitation

## 2021-04-09 ENCOUNTER — Telehealth: Payer: Self-pay | Admitting: Registered Nurse

## 2021-04-09 NOTE — Telephone Encounter (Signed)
Left Knee X-ray reviewed. Placed a call to Ms. Vinzant, she will continue with HEP and Alternating Ice and Heat. She also states in the past she was instructed not to take cortisone injections, since she is on Eliquis, she will speak with  her cardiologist, on her next scheduled appointment she reports. We will continue to monitor.

## 2021-04-10 DIAGNOSIS — J9610 Chronic respiratory failure, unspecified whether with hypoxia or hypercapnia: Secondary | ICD-10-CM | POA: Diagnosis not present

## 2021-04-10 DIAGNOSIS — G4733 Obstructive sleep apnea (adult) (pediatric): Secondary | ICD-10-CM | POA: Diagnosis not present

## 2021-04-14 ENCOUNTER — Telehealth (HOSPITAL_COMMUNITY): Payer: Self-pay | Admitting: *Deleted

## 2021-04-14 ENCOUNTER — Other Ambulatory Visit (HOSPITAL_COMMUNITY): Payer: Self-pay

## 2021-04-14 ENCOUNTER — Telehealth (HOSPITAL_COMMUNITY): Payer: Self-pay

## 2021-04-14 MED ORDER — DILTIAZEM HCL 30 MG PO TABS: ORAL_TABLET | ORAL | 1 refills | Status: DC

## 2021-04-14 NOTE — Telephone Encounter (Signed)
Spoke with patient in separate telephone encounter.

## 2021-04-14 NOTE — Telephone Encounter (Signed)
Patient called in stating she went into AF this morning around 1am. She endorses N/V/D which are typical symptoms when she has AF. She also had similar symptoms last week. She also realized she had not taken her cardizem in about 4 days by mistake she is now back on this medication. Her HRs were in the 130-170s initially she took an extra 50mg  of flecainide at 5am and her normal medications about 30 minutes ago. She also had arm and chest heaviness but this has subsided. Per Roderic Palau NP since pt just took her medications recommended lying back down and call with update in about an hour. ER precautions were reviewed with patient.

## 2021-04-14 NOTE — Telephone Encounter (Signed)
Attempted phone call to pt and left voicemail message to contact office at [phone]. 

## 2021-04-14 NOTE — Telephone Encounter (Signed)
Spoke with patient. See separate telephone encounter.

## 2021-04-14 NOTE — Telephone Encounter (Signed)
Contacted patient to see how she is feeling. Patient states her heart rate is in the 70s. She  feels like she went back into normal sinus rhythm. Instructed patient to go back to her normal meds and to not increase her Flecainide tonight per Adline Peals PA. She was told to contact the clinic back in the am to let us know how she is feeling. Consulted with patient and she verbalized understanding.

## 2021-04-14 NOTE — Telephone Encounter (Addendum)
Attempted phone call to pt and left voicemail message to contact [phone].  See also previous message that was sent to Rushie Goltz, RN Afib clinic.

## 2021-04-14 NOTE — Telephone Encounter (Signed)
Pt called with update. HR 66 BP 103/63. Still weak feeling but improvement in symptoms. She will increase her oral intake for volume replenishment and let me know if she is not feeling back to baseline tomorrow. She does feel she is back in NSR.

## 2021-04-27 ENCOUNTER — Encounter (HOSPITAL_COMMUNITY): Payer: Self-pay | Admitting: Physician Assistant

## 2021-04-27 ENCOUNTER — Ambulatory Visit (HOSPITAL_COMMUNITY)
Admission: RE | Admit: 2021-04-27 | Discharge: 2021-04-27 | Disposition: A | Discharge status: Home / Self Care | Payer: Medicare Other | Source: Ambulatory Visit | Attending: Physician Assistant | Admitting: Physician Assistant

## 2021-04-27 ENCOUNTER — Other Ambulatory Visit: Payer: Self-pay

## 2021-04-27 VITALS — BP 116/60 | HR 67 | Ht 65.0 in | Wt 211.0 lb

## 2021-04-27 DIAGNOSIS — Z7182 Exercise counseling: Secondary | ICD-10-CM | POA: Insufficient documentation

## 2021-04-27 DIAGNOSIS — Z6835 Body mass index (BMI) 35.0-35.9, adult: Secondary | ICD-10-CM | POA: Insufficient documentation

## 2021-04-27 DIAGNOSIS — Z885 Allergy status to narcotic agent status: Secondary | ICD-10-CM | POA: Diagnosis not present

## 2021-04-27 DIAGNOSIS — E669 Obesity, unspecified: Secondary | ICD-10-CM | POA: Diagnosis not present

## 2021-04-27 DIAGNOSIS — Z888 Allergy status to other drugs, medicaments and biological substances status: Secondary | ICD-10-CM | POA: Diagnosis not present

## 2021-04-27 DIAGNOSIS — I10 Essential (primary) hypertension: Secondary | ICD-10-CM | POA: Diagnosis not present

## 2021-04-27 DIAGNOSIS — E119 Type 2 diabetes mellitus without complications: Secondary | ICD-10-CM | POA: Insufficient documentation

## 2021-04-27 DIAGNOSIS — Z79890 Hormone replacement therapy (postmenopausal): Secondary | ICD-10-CM | POA: Diagnosis not present

## 2021-04-27 DIAGNOSIS — Z881 Allergy status to other antibiotic agents status: Secondary | ICD-10-CM | POA: Diagnosis not present

## 2021-04-27 DIAGNOSIS — G4733 Obstructive sleep apnea (adult) (pediatric): Secondary | ICD-10-CM | POA: Insufficient documentation

## 2021-04-27 DIAGNOSIS — Z79891 Long term (current) use of opiate analgesic: Secondary | ICD-10-CM | POA: Diagnosis not present

## 2021-04-27 DIAGNOSIS — Z880 Allergy status to penicillin: Secondary | ICD-10-CM | POA: Diagnosis not present

## 2021-04-27 DIAGNOSIS — Z9989 Dependence on other enabling machines and devices: Secondary | ICD-10-CM | POA: Insufficient documentation

## 2021-04-27 DIAGNOSIS — Z79899 Other long term (current) drug therapy: Secondary | ICD-10-CM | POA: Insufficient documentation

## 2021-04-27 DIAGNOSIS — I480 Paroxysmal atrial fibrillation: Secondary | ICD-10-CM | POA: Insufficient documentation

## 2021-04-27 DIAGNOSIS — Z7901 Long term (current) use of anticoagulants: Secondary | ICD-10-CM | POA: Insufficient documentation

## 2021-04-27 DIAGNOSIS — M797 Fibromyalgia: Secondary | ICD-10-CM | POA: Diagnosis not present

## 2021-04-27 DIAGNOSIS — D6869 Other thrombophilia: Secondary | ICD-10-CM | POA: Diagnosis not present

## 2021-04-27 NOTE — Progress Notes (Signed)
Primary Care Physician: Owens Loffler, MD Primary Cardiologist: Dr Ellyn Hack Primary Electrophysiologist: none Referring Physician: Dr Bary Leriche is a 70 y.o. female with a history of fibromyalgia, DM, atrial flutter, HTN, OSA, paroxysmal atrial fibrillation who presents for follow up in the [facility]. The patient was initially diagnosed with atrial fibrillation 04/01/19 after presenting with one week of fatigue and weakness. [name] underwent TEE/DCCV on 04/03/19. Patient is on Eliquis for a CHADS2VASC score of 4. [name] reports that [name] has done well since then until the night of 08/28/19 when [name] noted heart racing symptoms with some lightheaded. When [name] got up the next morning and check her home ECG device [name] was in afib with RVR. There were no specific triggers that the patient could identify however, [name] did have a COVID vaccine two weeks prior. [name] is on BiPap for OSA. [name] denies any significant alcohol use. Patient presented for DCCV on 09/09/19 in SR and the procedure was cancelled. [name] reports that [name] feels much better in SR with more energy, less SOB, no heart racing or dizziness.   On follow up today, patient reports that [name] has done well overall since her last visit. [name] did have an episode of afib on 04/14/21 which resolved with extra flecainide. Patient realized that [name] had inadvertently run out of diltiazem just prior to going into afib. [name] is in SR today. No bleeding issues on anticoagulation.   Today, [name] denies symptoms of palpitations, chest pain, shortness of breath, orthopnea, PND, lower extremity edema, presyncope, syncope, bleeding, or neurologic sequela. The patient is tolerating medications without difficulties and is otherwise without complaint today.    Atrial Fibrillation Risk Factors:  [name] does have symptoms or diagnosis of sleep apnea. [name] is compliant with BiPAP therapy. [name] does not have a history of rheumatic fever. [name]  does not have a history of alcohol use. The patient does not have a history of early familial atrial fibrillation or other arrhythmias.  [name] has a BMI of Body mass index is 35.11 kg/m.Marland Kitchen Filed Weights   04/27/21 1355  Weight: 95.7 kg     Family History  Problem Relation Age of Onset   Breast cancer Mother    Diabetes Mother    Heart disease Mother    Allergies Mother    Diabetes Father    Heart disease Father    Emphysema Father    Allergies Father    Stomach cancer Maternal Uncle    Breast cancer Paternal Aunt    Colon cancer Neg Hx      Atrial Fibrillation Management history:  Previous antiarrhythmic drugs: flecainide Previous cardioversions: 04/03/19 Previous ablations: none CHADS2VASC score: 4 Anticoagulation history: Eliquis   Past Medical History:  Diagnosis Date   B12 deficiency    Complication of anesthesia    slow to wake up    Depression    Diabetes mellitus type 2, diet-controlled ([place]) 07/24/2018   Fibromyalgia    Generalized hyperhidrosis 04/17/2014   GERD (gastroesophageal reflux disease)    History of rheumatic fever as a child    Hyperlipemia    Hypertension    denies htn on 05/09/16   HYPOTHYROIDISM 05/29/2007   Irritable bowel syndrome    Migraines    hx of    OSA (obstructive sleep apnea) 06/26/2019   Senile calcific aortic valve sclerosis    Aortic Sclerosis-no stenosis   Past Surgical History:  Procedure Laterality Date   29 HOUR PH  STUDY N/A 01/04/2016   Procedure: 24 HOUR PH STUDY;  Surgeon: Manus Gunning, MD;  Location: [facility];  Service: Gastroenterology;  Laterality: N/A;   ABDOMINAL HYSTERECTOMY  1988   ADENOIDECTOMY     ANTERIOR CERVICAL DECOMP/DISCECTOMY FUSION  2009, 1993   first procedure in ~ 1993/Dr [place] N/A 04/03/2019   Procedure: CARDIOVERSION;  Surgeon: Dorothy Spark, MD;  Location: [facility];  Service: Cardiovascular;  Laterality: N/A;    CHOLECYSTECTOMY  2005   ESOPHAGEAL MANOMETRY N/A 01/04/2016   Procedure: ESOPHAGEAL MANOMETRY (EM) 24 HR PH;  Surgeon: Manus Gunning, MD;  Location: [facility];  Service: Gastroenterology;  Laterality: N/A;   ESOPHAGOGASTRODUODENOSCOPY (EGD) WITH PROPOFOL N/A 05/17/2016   Procedure: ESOPHAGOGASTRODUODENOSCOPY (EGD) WITH PROPOFOL;  Surgeon: Manus Gunning, MD;  Location: [facility];  Service: Gastroenterology;  Laterality: N/A;   NASAL SINUS SURGERY  2006   SAVORY DILATION N/A 05/17/2016   Procedure: SAVORY DILATION;  Surgeon: Manus Gunning, MD;  Location: [facility];  Service: Gastroenterology;  Laterality: N/A;   TEE WITHOUT CARDIOVERSION N/A 04/03/2019   Procedure: TRANSESOPHAGEAL ECHOCARDIOGRAM (TEE)-with DCCV;  Surgeon: Dorothy Spark, MD;  Location: [facility] ENDOSCOPY;; EF 55 to 60%.  No LVH.  Normal RV.  Moderate LA dilation.  No thrombus.  Mild RA dilation.  Mild to moderate TR.  Mild aortic valve sclerosis, no stenosis.  Moderately elevated PA pressures (47 mmHg)   TONSILLECTOMY AND ADENOIDECTOMY     TRANSTHORACIC ECHOCARDIOGRAM  2009   Normal LV size and function. EF 60-65%. No regional wall motion and amount is. Mild aortic sclerosis, no stenosis   TUBAL LIGATION  1979    Current Outpatient Medications  Medication Sig Dispense Refill   acetaminophen (TYLENOL) 650 MG CR tablet Take 650 mg by mouth in the morning and at bedtime.     B Complex Vitamins (B COMPLEX 100 PO) Take 1 tablet by mouth every morning.      Calcium Carbonate-Vitamin D (CALCIUM 600+D PO) Take 1 tablet by mouth in the morning and at bedtime.      carboxymethylcellulose (REFRESH PLUS) 0.5 % SOLN Place 1 drop into both eyes 3 (three) times daily as needed (dry eyes).      cetirizine (ZYRTEC) 10 MG tablet Take 10 mg by mouth at bedtime.      Cholecalciferol (VITAMIN D) 125 MCG (5000 UT) CAPS Take 5,000 Units by mouth every morning.      clindamycin (CLEOCIN T) 1 % lotion Apply 1 application  topically daily as needed (eczema).      Coenzyme Q-10 100 MG capsule Take 100 mg by mouth every morning.      diclofenac sodium (VOLTAREN) 1 % GEL Apply 4 g topically 4 (four) times daily as needed (joint pain).      diltiazem (CARDIZEM CD) 240 MG 24 hr capsule TAKE 1 CAPSULE BY MOUTH EVERY DAY 90 capsule 3   diltiazem (CARDIZEM) 30 MG tablet Take one tablet by mouth every 4 hours as needed for heart rate greater than 100, top number blood pressure needs to be above 100 45 tablet 1   docusate sodium (COLACE) 250 MG capsule Take 250 mg by mouth at bedtime.     ELIQUIS 5 MG TABS tablet TAKE 1 TABLET BY MOUTH  TWICE DAILY 180 tablet 3   flecainide (TAMBOCOR) 50 MG tablet TAKE 1 TABLET BY MOUTH  TWICE DAILY 180 tablet 3  fluocinonide (LIDEX) 0.05 % external solution Apply 1 application topically daily as needed (eczema on scalp).      gabapentin (NEURONTIN) 300 MG capsule TAKE 1 CAPSULE BY MOUTH 3  TIMES DAILY 270 capsule 3   GRAPE SEED EXTRACT PO Take 100 mg by mouth 2 (two) times daily.      levothyroxine (SYNTHROID) 50 MCG tablet TAKE 1 TABLET BY MOUTH  DAILY 90 tablet 3   loratadine (CLARITIN) 10 MG tablet Take 10 mg by mouth every morning.      Magnesium Bisglycinate (MAG GLYCINATE PO) Take 400 mg by mouth at bedtime.      methocarbamol (ROBAXIN) 500 MG tablet TAKE 1 TABLET BY MOUTH EVERY 8 HOURS AS NEEDED FOR MUSCLE SPASMS. 270 tablet 3   mirtazapine (REMERON) 15 MG tablet TAKE 1 TABLET BY MOUTH AT  BEDTIME 90 tablet 1   morphine (MS CONTIN) 15 MG 12 hr tablet Take 1 tablet (15 mg total) by mouth 3 (three) times daily. Do Not Fill Before 10/ 20/ 2022 90 tablet 0   oxybutynin (DITROPAN-XL) 10 MG 24 hr tablet Take 10 mg by mouth at bedtime.     oxymetazoline (AFRIN) 0.05 % nasal spray Place 1 spray into both nostrils 2 (two) times daily as needed for congestion.     RABEprazole (ACIPHEX) 20 MG tablet TAKE 1 TABLET BY MOUTH EVERY DAY 90 tablet 3   senna (SENOKOT) 8.6 MG TABS tablet Take 4  tablets by mouth at bedtime.     tretinoin (RETIN-A) 0.05 % cream Apply 1 application topically daily as needed (acne).      No current facility-administered medications for this encounter.    Allergies  Allergen Reactions   Cyclobenzaprine Hcl Anaphylaxis and Swelling    Reaction - throat closed, tongue swelling   Rizatriptan Benzoate Anaphylaxis    Throat and Tongue Swelling Closed   Sumatriptan Swelling    Throat closed & Tongue Swelling   Tizanidine Shortness Of Breath   Aleve [Naproxen Sodium] Hives   Clarithromycin Hives   Doxycycline Diarrhea and Nausea And Vomiting   Duloxetine Nausea Only   Egg [Eggs Or Egg-Derived Products] Diarrhea and Nausea And Vomiting   Keflex [Cephalexin] Itching and Other (See Comments)    Sores   Metronidazole Diarrhea and Nausea Only   Oxycodone Hcl Diarrhea and Nausea And Vomiting   Oxycontin [Oxycodone Hcl] Nausea And Vomiting   Penicillins Other (See Comments)    Unknown reaction Did it involve swelling of the face/tongue/throat, SOB, or low BP? Unknown Did it involve sudden or severe rash/hives, skin peeling, or any reaction on the inside of your mouth or nose? Unknown Did you need to seek medical attention at a hospital or doctor's office? Unknown When did it last happen?   infant   If all above answers are "NO", may proceed with cephalosporin use.   Rosuvastatin Other (See Comments)    Muscle aches   Venlafaxine Hives   Voltaren [Diclofenac Sodium] Hives    Reaction to oral med, uses gel occasionally   Septra [Sulfamethoxazole-Trimethoprim] Itching and Rash   Tramadol Diarrhea, Nausea And Vomiting and Palpitations    Social History   Socioeconomic History   Marital status: Married    Spouse name: Not on file   Number of children: Not on file   Years of education: Not on file   Highest education level: Not on file  Occupational History   Occupation: Radiation protection practitioner   Occupation: retired  Tobacco Use   Smoking status:  Never    Smokeless tobacco: Never  Vaping Use   Vaping Use: Never used  Substance and Sexual Activity   Alcohol use: Yes    Alcohol/week: 1.0 standard drink    Types: 1 Glasses of wine per week    Comment: rare   Drug use: Never   Sexual activity: Not Currently  Other Topics Concern   Not on file  Social History Narrative   Not on file   Social Determinants of Health   Financial Resource Strain: Low Risk    Difficulty of Paying Living Expenses: Not very hard  Food Insecurity: Not on file  Transportation Needs: Not on file  Physical Activity: Not on file  Stress: Not on file  Social Connections: Not on file  Intimate Partner Violence: Not on file     ROS- All systems are reviewed and negative except as per the HPI above.  Physical Exam: Vitals:   04/27/21 1355  BP: 116/60  Pulse: 67  Weight: 95.7 kg  Height: 5\' 5"  (1.651 m)    GEN- The patient is a well appearing obese female, alert and oriented x 3 today.   HEENT-head normocephalic, atraumatic, sclera clear, conjunctiva pink, hearing intact, trachea midline. Lungs- Clear to ausculation bilaterally, normal work of breathing Heart- Regular rate and rhythm, no rubs or gallops, 2/6 systolic murmur GI- soft, NT, ND, + BS Extremities- no clubbing, cyanosis, or edema MS- no significant deformity or atrophy Skin- no rash or lesion Psych- euthymic mood, full affect Neuro- strength and sensation are intact   Wt Readings from Last 3 Encounters:  04/27/21 95.7 kg  04/06/21 97.2 kg  03/10/21 98.4 kg    EKG today demonstrates  SR Vent. rate 67 BPM PR interval 154 ms QRS duration 88 ms QT/QTcB 384/405 ms  TEE 04/03/19 demonstrated   1. Left ventricular ejection fraction, by visual estimation, is 55 to  60%. The left ventricle has normal function. Normal left ventricular size. There is no left ventricular hypertrophy.   2. Global right ventricle has normal systolic function.The right  ventricular size is normal. No increase  in right ventricular wall  thickness.   3. Left atrial size was moderately dilated.   4. No thrombus is seen in the left atrium or left atrial appendage.   5. Right atrial size was mildly dilated.   6. The mitral valve is normal in structure. Mild mitral valve  regurgitation. No evidence of mitral stenosis.   7. The tricuspid valve is normal in structure. Tricuspid valve  regurgitation mild-moderate.   8. The aortic valve is normal in structure. Aortic valve regurgitation is mild by color flow Doppler. Mild aortic valve sclerosis without stenosis.   9. The pulmonic valve was normal in structure. Pulmonic valve  regurgitation is not visualized by color flow Doppler.  10. Moderately elevated pulmonary artery systolic pressure.  11. The tricuspid regurgitant velocity is 3.12 m/s, and with an assumed right atrial pressure of 8 mmHg, the estimated right ventricular systolic  pressure is moderately elevated at 46.9 mmHg.  12. The inferior vena cava is normal in size with greater than 50%  respiratory variability, suggesting right atrial pressure of 3 mmHg.  13. No thrombus is seen in the left atrium or left atrial appendage.  14. This TEE was followed by a successful cardioversion.   Epic records are reviewed at length today  CHA2DS2-VASc Score = 4 The patient's score is based upon: CHF History: No HTN History: Yes Age : [age] Diabetes History:  Yes Stroke History: No Vascular Disease History: No Gender: Female   ASSESSMENT AND PLAN: 1. Paroxysmal Atrial Fibrillation/typical atrial flutter The patient's CHA2DS2-VASc score is 4, indicating a 4.8% annual risk of stroke.   Patient in [place] with rare episodes.  Continue flecainide 50 mg BID Continue diltiazem 240 mg daily  Continue Eliquis 5 mg BID  2. Secondary Hypercoagulable State (ICD10:  D68.69) The patient is at significant risk for stroke/thromboembolism based upon her CHA2DS2-VASc Score of 4.  Continue Apixaban (Eliquis).   3.  Obesity Body mass index is 35.11 kg/m. Lifestyle modification was discussed and encouraged including regular physical activity and weight reduction.  4. OSA Patient reports compliance with BiPap therapy.  5. HTN Stable, no changes today.   Follow up with Dr Ellyn Hack per recall. AF clinic in 6 months.    [facility] [address] [place], [place] [address] [phone] 04/27/2021 2:13 PM

## 2021-04-29 ENCOUNTER — Encounter: Payer: Medicare Other | Attending: Physical Medicine & Rehabilitation | Admitting: Physical Medicine & Rehabilitation

## 2021-04-29 ENCOUNTER — Encounter: Payer: Self-pay | Admitting: Physical Medicine & Rehabilitation

## 2021-04-29 ENCOUNTER — Other Ambulatory Visit: Payer: Self-pay

## 2021-04-29 VITALS — BP 129/74 | HR 70 | Temp 98.9°F | Ht 65.0 in | Wt 208.0 lb

## 2021-04-29 DIAGNOSIS — Z79891 Long term (current) use of opiate analgesic: Secondary | ICD-10-CM | POA: Diagnosis not present

## 2021-04-29 DIAGNOSIS — M62830 Muscle spasm of back: Secondary | ICD-10-CM | POA: Diagnosis not present

## 2021-04-29 DIAGNOSIS — M170 Bilateral primary osteoarthritis of knee: Secondary | ICD-10-CM | POA: Insufficient documentation

## 2021-04-29 DIAGNOSIS — M533 Sacrococcygeal disorders, not elsewhere classified: Secondary | ICD-10-CM | POA: Diagnosis not present

## 2021-04-29 DIAGNOSIS — G8929 Other chronic pain: Secondary | ICD-10-CM | POA: Insufficient documentation

## 2021-04-29 DIAGNOSIS — Z5181 Encounter for therapeutic drug level monitoring: Secondary | ICD-10-CM | POA: Diagnosis not present

## 2021-04-29 DIAGNOSIS — G894 Chronic pain syndrome: Secondary | ICD-10-CM | POA: Diagnosis not present

## 2021-04-29 DIAGNOSIS — M546 Pain in thoracic spine: Secondary | ICD-10-CM | POA: Diagnosis not present

## 2021-04-29 NOTE — Progress Notes (Signed)
  PROCEDURE RECORD [facility]   Name: [name] DOB:1951-06-05 MRN: [id number]  Date:04/29/2021  Physician: Alysia Penna, MD    Nurse/CMA: Jorja Loa MA  Allergies:  Allergies  Allergen Reactions   Cyclobenzaprine Hcl Anaphylaxis and Swelling    Reaction - throat closed, tongue swelling   Rizatriptan Benzoate Anaphylaxis    Throat and Tongue Swelling Closed   Sumatriptan Swelling    Throat closed & Tongue Swelling   Tizanidine Shortness Of Breath   Aleve [Naproxen Sodium] Hives   Clarithromycin Hives   Doxycycline Diarrhea and Nausea And Vomiting   Duloxetine Nausea Only   Egg [Eggs Or Egg-Derived Products] Diarrhea and Nausea And Vomiting   Keflex [Cephalexin] Itching and Other (See Comments)    Sores   Metronidazole Diarrhea and Nausea Only   Oxycodone Hcl Diarrhea and Nausea And Vomiting   Oxycontin [Oxycodone Hcl] Nausea And Vomiting   Penicillins Other (See Comments)    Unknown reaction Did it involve swelling of the face/tongue/throat, SOB, or low BP? Unknown Did it involve sudden or severe rash/hives, skin peeling, or any reaction on the inside of your mouth or nose? Unknown Did you need to seek medical attention at a hospital or doctor's office? Unknown When did it last happen?   infant   If all above answers are "NO", may proceed with cephalosporin use.   Rosuvastatin Other (See Comments)    Muscle aches   Venlafaxine Hives   Voltaren [Diclofenac Sodium] Hives    Reaction to oral med, uses gel occasionally   Septra [Sulfamethoxazole-Trimethoprim] Itching and Rash   Tramadol Diarrhea, Nausea And Vomiting and Palpitations    Consent Signed: Yes.    Is patient diabetic? Yes.    CBG today? Per pt no need  Pregnant: No. LMP: No LMP recorded. Patient has had a hysterectomy. (age [age])  Anticoagulants: yes (Eliquis taken at 9 AM today) Anti-inflammatory: no Antibiotics: no  Procedure:  Right Sacroiliac Steroid   Injection  Position: Prone Start Time:11:15 am   End Time: 11:18 am  Fluoro Time: 12  RN/CMA Parks Czajkowski MA Ziyon Cedotal MA    Time 11:02 am 11:22 am    BP 129/74 145/71    Pulse 71 65    Respirations 16 16    O2 Sat 95 95    S/S 6 6    Pain Level 10/10 5/10     D/C home with Spouse, patient A & O X 3, D/C instructions reviewed, and sits independently.

## 2021-04-29 NOTE — Progress Notes (Signed)

## 2021-05-04 ENCOUNTER — Telehealth: Payer: Self-pay

## 2021-05-04 NOTE — Progress Notes (Addendum)
Chronic Care Management Pharmacy Assistant   Name: [name]  MRN: [id number] DOB: 1951-03-24  Reason for Encounter: CCM (Diabetes Disease State)   Recent office visits:  None since last CCM contact  Recent consult visits:  04/29/2021 - Physical Medicine and Rehabilitation - Patient presented for chronic right sacroiliac pain. No medication changes.  04/27/2021 - Cardiology - Patient presented for Paroxysmal Atrial Fibrillation. Labs: EKG. No medication changes.  04/14/2021 - Patient Message - Start: Diltiazem 30mg  if heart rate is greater than 100 and blood pressure top number is above 100 as well. Only for tonight: one extra dose of Flecainide 50 mg tablet due to A-Fib.   Hospital visits:  None in previous 6 months  Medications: Outpatient Encounter Medications as of 05/04/2021  Medication Sig   acetaminophen (TYLENOL) 650 MG CR tablet Take 650 mg by mouth in the morning and at bedtime.   B Complex Vitamins (B COMPLEX 100 PO) Take 1 tablet by mouth every morning.    Calcium Carbonate-Vitamin D (CALCIUM 600+D PO) Take 1 tablet by mouth in the morning and at bedtime.    carboxymethylcellulose (REFRESH PLUS) 0.5 % SOLN Place 1 drop into both eyes 3 (three) times daily as needed (dry eyes).    cetirizine (ZYRTEC) 10 MG tablet Take 10 mg by mouth at bedtime.    Cholecalciferol (VITAMIN D) 125 MCG (5000 UT) CAPS Take 5,000 Units by mouth every morning.    clindamycin (CLEOCIN T) 1 % lotion Apply 1 application topically daily as needed (eczema).    Coenzyme Q-10 100 MG capsule Take 100 mg by mouth every morning.    diclofenac sodium (VOLTAREN) 1 % GEL Apply 4 g topically 4 (four) times daily as needed (joint pain).    diltiazem (CARDIZEM CD) 240 MG 24 hr capsule TAKE 1 CAPSULE BY MOUTH EVERY DAY   diltiazem (CARDIZEM) 30 MG tablet Take one tablet by mouth every 4 hours as needed for heart rate greater than 100, top number blood pressure needs to be above 100   docusate sodium  (COLACE) 250 MG capsule Take 250 mg by mouth at bedtime.   ELIQUIS 5 MG TABS tablet TAKE 1 TABLET BY MOUTH  TWICE DAILY   flecainide (TAMBOCOR) 50 MG tablet TAKE 1 TABLET BY MOUTH  TWICE DAILY   fluocinonide (LIDEX) 0.05 % external solution Apply 1 application topically daily as needed (eczema on scalp).    gabapentin (NEURONTIN) 300 MG capsule TAKE 1 CAPSULE BY MOUTH 3  TIMES DAILY   GRAPE SEED EXTRACT PO Take 100 mg by mouth 2 (two) times daily.    levothyroxine (SYNTHROID) 50 MCG tablet TAKE 1 TABLET BY MOUTH  DAILY   loratadine (CLARITIN) 10 MG tablet Take 10 mg by mouth every morning.    Magnesium Bisglycinate (MAG GLYCINATE PO) Take 400 mg by mouth at bedtime.    methocarbamol (ROBAXIN) 500 MG tablet TAKE 1 TABLET BY MOUTH EVERY 8 HOURS AS NEEDED FOR MUSCLE SPASMS.   mirtazapine (REMERON) 15 MG tablet TAKE 1 TABLET BY MOUTH AT  BEDTIME   morphine (MS CONTIN) 15 MG 12 hr tablet Take 1 tablet (15 mg total) by mouth 3 (three) times daily. Do Not Fill Before 10/ 20/ 2022   oxybutynin (DITROPAN-XL) 10 MG 24 hr tablet Take 10 mg by mouth at bedtime.   oxymetazoline (AFRIN) 0.05 % nasal spray Place 1 spray into both nostrils 2 (two) times daily as needed for congestion.   RABEprazole (ACIPHEX) 20 MG tablet  TAKE 1 TABLET BY MOUTH EVERY DAY   senna (SENOKOT) 8.6 MG TABS tablet Take 4 tablets by mouth at bedtime.   tretinoin (RETIN-A) 0.05 % cream Apply 1 application topically daily as needed (acne).    No facility-administered encounter medications on file as of 05/04/2021.   Recent Relevant Labs: Lab Results  Component Value Date/Time   HGBA1C 7.0 (A) 12/21/2020 02:35 PM   HGBA1C 7.7 (H) 09/14/2020 02:37 PM   HGBA1C 7.6 (A) 06/15/2020 03:20 PM   HGBA1C 8.2 (H) 03/12/2020 01:25 PM   MICROALBUR 5.5 (H) 03/12/2020 01:47 PM   MICROALBUR <0.7 01/18/2019 02:07 PM    Kidney Function Lab Results  Component Value Date/Time   CREATININE 0.77 09/14/2020 02:37 PM   CREATININE 0.73 03/12/2020 01:25  PM   GFR 79.01 09/14/2020 02:37 PM   GFRNONAA >60 08/29/2019 04:15 PM   GFRAA >60 08/29/2019 04:15 PM   Contacted patient on 05/04/2021 to discuss diabetes disease state.   Current antihyperglycemic regimen:  No Pharmacotherapy   Patient verbally confirms she is not taking any medications in regards to her Diabetes.   What diet changes have been made to improve diabetes control? Patient states she is watching what she eats; tries to stay away from sugar. Patient states she has lost 25lbs this year and feels this has helped her.   What recent interventions/DTPs have been made to improve glycemic control:  No recent interventions   Have there been any recent hospitalizations or ED visits since last visit with CPP? No  Patient denies hypoglycemic symptoms, including Pale, Sweaty, Shaky, Hungry, Nervous/irritable, and Vision changes  Patient denies hyperglycemic symptoms, including blurry vision, excessive thirst, fatigue, polyuria, and weakness  How often are you checking your blood sugar? Patient does not check her sugar at home.   Are you checking your feet daily/regularly? Yes  Adherence Review: Is the patient currently on a STATIN medication? No Is the patient currently on ACE/ARB medication? No Does the patient have >5 day gap between last estimated fill dates? No  Care Gaps: Annual wellness visit in last year? No Most recent A1C reading: 7.0 on 12/21/2020 Most Recent BP reading: 129/74 on 04/29/2021  Last eye exam / retinopathy screening: Up to date Last diabetic foot exam: 11/29/2017  Counseled patient on importance of annual eye and foot exam.   Star Rating Drugs:  Medication:  Last Fill: [facility] Flecainide 50mg          03/17/2021      90  PCP appointment on 05/31/2021 for [place], [place] notified  Marijean Niemann, [place] Assistant [phone]  I have reviewed the care management and care coordination activities outlined in this encounter  and I am certifying that I agree with the content of this note. No further action required.  Debbora Dus, PharmD Clinical Pharmacist [facility] at [facility] [phone]

## 2021-05-12 ENCOUNTER — Encounter: Payer: Medicare Other | Admitting: Registered Nurse

## 2021-05-12 ENCOUNTER — Encounter: Payer: Self-pay | Admitting: Registered Nurse

## 2021-05-12 ENCOUNTER — Other Ambulatory Visit: Payer: Self-pay

## 2021-05-12 VITALS — BP 116/70 | HR 64 | Temp 99.4°F | Ht 65.0 in | Wt 212.4 lb

## 2021-05-12 DIAGNOSIS — G8929 Other chronic pain: Secondary | ICD-10-CM | POA: Diagnosis not present

## 2021-05-12 DIAGNOSIS — M533 Sacrococcygeal disorders, not elsewhere classified: Secondary | ICD-10-CM | POA: Diagnosis not present

## 2021-05-12 DIAGNOSIS — Z5181 Encounter for therapeutic drug level monitoring: Secondary | ICD-10-CM | POA: Diagnosis not present

## 2021-05-12 DIAGNOSIS — M546 Pain in thoracic spine: Secondary | ICD-10-CM

## 2021-05-12 DIAGNOSIS — G894 Chronic pain syndrome: Secondary | ICD-10-CM

## 2021-05-12 DIAGNOSIS — M62830 Muscle spasm of back: Secondary | ICD-10-CM | POA: Diagnosis not present

## 2021-05-12 DIAGNOSIS — Z79891 Long term (current) use of opiate analgesic: Secondary | ICD-10-CM | POA: Diagnosis not present

## 2021-05-12 DIAGNOSIS — M170 Bilateral primary osteoarthritis of knee: Secondary | ICD-10-CM | POA: Diagnosis not present

## 2021-05-12 MED ORDER — MORPHINE SULFATE ER 15 MG PO TBCR: 15.0000 mg | EXTENDED_RELEASE_TABLET | Freq: Three times a day (TID) | ORAL | 0 refills | Status: DC

## 2021-05-12 NOTE — Progress Notes (Signed)
Subjective:    Patient ID: [name], female    DOB: July 04, 1950, 70 y.o.   MRN: [id number]  HPI: [name] is a 70 y.o. female who returns for follow up appointment for chronic pain and medication refill. She states her pain is located in her mid- back, right hip and left knee pain. She rates her pain 4. Her current exercise regime is walking and performing stretching exercises.  Ms. Mom Morphine equivalent is 45.00 MME.   Last UDS was Performed on 10/14/2020, it was consistent.     Pain Inventory Average Pain 5 Pain Right Now 4 My pain is constant, burning, and aching  In the last 24 hours, has pain interfered with the following? General activity 4 Relation with others 4 Enjoyment of life 4 What TIME of day is your pain at its worst? evening and night Sleep (in general) Good  Pain is worse with: walking, bending, standing, and some activites Pain improves with: rest and heat/ice Relief from Meds: 5  Family History  Problem Relation Age of Onset   Breast cancer Mother    Diabetes Mother    Heart disease Mother    Allergies Mother    Diabetes Father    Heart disease Father    Emphysema Father    Allergies Father    Stomach cancer Maternal Uncle    Breast cancer Paternal Aunt    Colon cancer Neg Hx    Social History   Socioeconomic History   Marital status: Married    Spouse name: Not on file   Number of children: Not on file   Years of education: Not on file   Highest education level: Not on file  Occupational History   Occupation: Radiation protection practitioner   Occupation: retired  Tobacco Use   Smoking status: Never   Smokeless tobacco: Never  Vaping Use   Vaping Use: Never used  Substance and Sexual Activity   Alcohol use: Yes    Alcohol/week: 1.0 standard drink    Types: 1 Glasses of wine per week    Comment: rare   Drug use: Never   Sexual activity: Not Currently  Other Topics Concern   Not on file  Social History Narrative   Not on file   Social  Determinants of Health   Financial Resource Strain: Low Risk    Difficulty of Paying Living Expenses: Not very hard  Food Insecurity: Not on file  Transportation Needs: Not on file  Physical Activity: Not on file  Stress: Not on file  Social Connections: Not on file   Past Surgical History:  Procedure Laterality Date   85 HOUR [place] STUDY N/A 01/04/2016   Procedure: [place];  Surgeon: Manus Gunning, MD;  Location: [facility];  Service: Gastroenterology;  Laterality: N/A;   ABDOMINAL HYSTERECTOMY  1988   ADENOIDECTOMY     ANTERIOR CERVICAL DECOMP/DISCECTOMY FUSION  2009, 1993   first procedure in ~ 1993/Dr [place] N/A 04/03/2019   Procedure: CARDIOVERSION;  Surgeon: Dorothy Spark, MD;  Location: [place];  Service: Cardiovascular;  Laterality: N/A;   CHOLECYSTECTOMY  2005   ESOPHAGEAL MANOMETRY N/A 01/04/2016   Procedure: ESOPHAGEAL MANOMETRY (EM) 24 HR PH;  Surgeon: Manus Gunning, MD;  Location: [facility];  Service: Gastroenterology;  Laterality: N/A;   ESOPHAGOGASTRODUODENOSCOPY (EGD) WITH PROPOFOL N/A 05/17/2016   Procedure: ESOPHAGOGASTRODUODENOSCOPY (EGD) WITH PROPOFOL;  Surgeon: Renelda Loma  Havery Moros, MD;  Location: [facility];  Service: Gastroenterology;  Laterality: N/A;   NASAL SINUS SURGERY  2006   SAVORY DILATION N/A 05/17/2016   Procedure: SAVORY DILATION;  Surgeon: Manus Gunning, MD;  Location: [facility];  Service: Gastroenterology;  Laterality: N/A;   TEE WITHOUT CARDIOVERSION N/A 04/03/2019   Procedure: TRANSESOPHAGEAL ECHOCARDIOGRAM (TEE)-with DCCV;  Surgeon: Dorothy Spark, MD;  Location: [facility] END[facility]COPY;; EF 55 to 60%.  No LVH.  Normal RV.  Moderate LA dilation.  No thrombus.  Mild RA dilation.  Mild to moderate TR.  Mild aortic valve sclerosis, no stenosis.  Moderately elevated PA pressures (47 mmHg)   TONSILLECTOMY AND ADENOIDECTOMY     TRANSTHORACIC  ECHOCARDIOGRAM  2009   Normal LV size and function. EF 60-65%. No regional wall motion and amount is. Mild aortic sclerosis, no stenosis   TUBAL LIGATION  1979   Past Surgical History:  Procedure Laterality Date   74 HOUR [place] STUDY N/A 01/04/2016   Procedure: 24 HOUR PH STUDY;  Surgeon: Manus Gunning, MD;  Location: [facility];  Service: Gastroenterology;  Laterality: N/A;   ABDOMINAL HYSTERECTOMY  1988   ADENOIDECTOMY     ANTERIOR CERVICAL DECOMP/DISCECTOMY FUSION  2009, 1993   first procedure in ~ 1993/Dr [place] N/A 04/03/2019   Procedure: CARDIOVERSION;  Surgeon: Dorothy Spark, MD;  Location: [place];  Service: Cardiovascular;  Laterality: N/A;   CHOLECYSTECTOMY  2005   ESOPHAGEAL MANOMETRY N/A 01/04/2016   Procedure: ESOPHAGEAL MANOMETRY (EM) 24 HR PH;  Surgeon: Manus Gunning, MD;  Location: [facility];  Service: Gastroenterology;  Laterality: N/A;   ESOPHAGOGASTRODUODENOSCOPY (EGD) WITH PROPOFOL N/A 05/17/2016   Procedure: ESOPHAGOGASTRODUODENOSCOPY (EGD) WITH PROPOFOL;  Surgeon: Manus Gunning, MD;  Location: [facility];  Service: Gastroenterology;  Laterality: N/A;   NASAL SINUS SURGERY  2006   SAVORY DILATION N/A 05/17/2016   Procedure: SAVORY DILATION;  Surgeon: Manus Gunning, MD;  Location: [facility];  Service: Gastroenterology;  Laterality: N/A;   TEE WITHOUT CARDIOVERSION N/A 04/03/2019   Procedure: TRANSESOPHAGEAL ECHOCARDIOGRAM (TEE)-with DCCV;  Surgeon: Dorothy Spark, MD;  Location: [facility] ENDOSCOPY;; EF 55 to 60%.  No LVH.  Normal RV.  Moderate LA dilation.  No thrombus.  Mild RA dilation.  Mild to moderate TR.  Mild aortic valve sclerosis, no stenosis.  Moderately elevated PA pressures (47 mmHg)   TONSILLECTOMY AND ADENOIDECTOMY     TRANSTHORACIC ECHOCARDIOGRAM  2009   Normal LV size and function. EF 60-65%. No regional wall motion and amount is. Mild aortic sclerosis,  no stenosis   TUBAL LIGATION  1979   Past Medical History:  Diagnosis Date   B12 deficiency    Complication of anesthesia    slow to wake up    Depression    Diabetes mellitus type 2, diet-controlled ([place]) 07/24/2018   Fibromyalgia    Generalized hyperhidrosis 04/17/2014   GERD (gastroesophageal reflux disease)    History of rheumatic fever as a child    Hyperlipemia    Hypertension    denies htn on 05/09/16   HYPOTHYROIDISM 05/29/2007   Irritable bowel syndrome    Migraines    hx of    OSA (obstructive sleep apnea) 06/26/2019   Senile calcific aortic valve sclerosis    Aortic Sclerosis-no stenosis   Ht 5\' 5"  (1.651 m)   Wt 212 lb 6.4 oz (96.3 kg)   BMI 35.35 kg/m  Opioid Risk Score:   Fall Risk Score:  `1  Depression screen PHQ 2/9  Depression screen [facility] 2/9 05/12/2021 04/29/2021 01/26/2021 10/27/2020 08/12/2020 06/11/2020 02/12/2020  Decreased Interest 0 1 1 1  0 0 0  Down, Depressed, Hopeless 0 1 1 1  0 - 0  PHQ - 2 Score 0 2 2 2  0 0 0  Altered sleeping - - - - - - 0  Tired, decreased energy - - - - - - 0  Change in appetite - - - - - - 0  Feeling bad or failure about yourself  - - - - - - 0  Trouble concentrating - - - - - - 0  Moving slowly or fidgety/restless - - - - - - 0  Suicidal thoughts - - - - - - 0  PHQ-9 Score - - - - - - 0  Difficult doing work/chores - - - - - - Not difficult at all  Some recent data might be hidden     Review of Systems  Constitutional: Negative.   HENT: Negative.    Eyes: Negative.   Respiratory: Negative.    Cardiovascular: Negative.   Gastrointestinal: Negative.   Endocrine: Negative.   Genitourinary: Negative.   Musculoskeletal:  Positive for back pain.  Skin: Negative.   Allergic/Immunologic: Negative.   Neurological:  Positive for headaches.  Hematological: Negative.   Psychiatric/Behavioral: Negative.        Objective:   Physical Exam Vitals and nursing note reviewed.  Constitutional:      Appearance: Normal  appearance.  Cardiovascular:     Rate and Rhythm: Normal rate and regular rhythm.     Pulses: Normal pulses.     Heart sounds: Normal heart sounds.  Pulmonary:     Effort: Pulmonary effort is normal.     Breath sounds: Normal breath sounds.  Musculoskeletal:     Cervical back: Normal range of motion and neck supple.     Comments: Normal Muscle Bulk and Muscle Testing Reveals:  Upper Extremities: Full ROM and Muscle Strength 5/5 Thoracic Paraspinal Tenderness: T-7-T-9 Lumbar Paraspinal Tenderness: L-4-L-5 Mainly Right Side Lower Extremities: Full ROM and Muscle Strength 5/5 Arises from chair slowly Narrow Based  Gait     Skin:    General: Skin is warm and dry.  Neurological:     Mental Status: She is alert and oriented to person, place, and time.  Psychiatric:        Mood and Affect: Mood normal.        Behavior: Behavior normal.         Assessment & Plan:  1. Cervical postlaminectomy syndrome:/ Cervicalgia/ Cervical Radiculitis/ with neck and shoulder pain/ Chronic Midline Thoracic Pain.  Continue exercise routine and using heat therapy. 05/12/2021 Continue : current medication regimen. Refilled: MS Contin 15 mg one tablet three times a day #90.   We will continue the opioid monitoring program, this consists of regular clinic visits, examinations, urine drug screen, pill counts as well as use of [place] Controlled Substance Reporting system. A 12 month History has been reviewed on the [place] on 05/12/2021.   2. Fibromyalgia/ Neuropathy: Continue current medication regimen with Gabapentin, activity and exercise regime. 11/16 /2022. 3. Bilateral Knee Pain/ Mild Degenerative Changes: L>R Continue current medication regimen with Voltaren Gel. 05/12/2021. 4. Bilateral  Ankle Pain:  No complaints today. Continue HEP as tolerated. Continue to monitor.05/12/2021. 5. Muscle Spasm: Continue current medication regimen with  Robaxin.  Continue  to Monitor.05/12/2021.. 6. Opioid Induced Constipation: No complaints Continue to Monitor. 05/12/2021. 7. Midline Low Back Pain: Continue current medication regime, continue HEP as tolerated. 05/12/2021. 8. Right Gluteal Medius Weakness and Atrophy: No complaints Today:Continue with Hip Abduction Exercises as tolerated: Continue to Monitor. 05/12/2021. 9. Right Hip Pain/ Right Greater Trochanteric Tenderness: Continue to Alternate Ice and Heat Therapy. Continue to Monitor. 05/12/2021  10. Sacroiliac Joint Pain:  S/P Sacroiliac Injection with good relief noted. 05/12/2021 11. Lumbar Radiculitis: No complaints today.Continue Gabapentin. Continue HEP as tolerated. Continue current medication regimen. Continue to monitor.  05/12/2021.    F/U in 1 month

## 2021-05-28 NOTE — Progress Notes (Signed)
Subjective:   [name] is a 70 y.o. female who presents for Medicare Annual (Subsequent) preventive examination.  I connected with [name] today by telephone and verified that I am speaking with the correct person using two identifiers. Location patient: home Location provider: work Persons participating in the virtual visit: patient, Marine scientist.    I discussed the limitations, risks, security and privacy concerns of performing an evaluation and management service by telephone and the availability of in person appointments. I also discussed with the patient that there may be a patient responsible charge related to this service. The patient expressed understanding and verbally consented to this telephonic visit.    Interactive audio and video telecommunications were attempted between this provider and patient, however failed, due to patient having technical difficulties OR patient did not have access to video capability.  We continued and completed visit with audio only.  Some vital signs may be absent or patient reported.   Time Spent with patient on telephone encounter: 20 minutes  Review of Systems     Cardiac Risk Factors include: advanced age (>46men, >27 women);diabetes mellitus;dyslipidemia;hypertension     Objective:    Today's Vitals   05/31/21 1114  Weight: 212 lb (96.2 kg)  Height: 5\' 5"  (1.651 m)   Body mass index is 35.28 kg/m.  Advanced Directives 05/31/2021 11/30/2020 02/12/2020 06/26/2019 04/13/2019 04/02/2019 01/18/2019  Does Patient Have a Medical Advance Directive? Yes Yes Yes Yes No No Yes  Type of Paramedic of [place];Living will [place];Living will;Out of facility DNR (pink MOST or yellow form) [place];Living will [place];Living will - - [place];Living will  Does patient want to make changes to medical advance directive? Yes  (MAU/Ambulatory/Procedural Areas - Information given) No - Patient declined - No - Patient declined - - No - Patient declined  Copy of [place] in Chart? Yes - validated most recent copy scanned in chart (See row information) - No - copy requested No - copy requested - - No - copy requested  Would patient like information on creating a medical advance directive? - - - - No - Patient declined No - Patient declined -    Current Medications (verified) Outpatient Encounter Medications as of 05/31/2021  Medication Sig   acetaminophen (TYLENOL) 650 MG CR tablet Take 650 mg by mouth in the morning and at bedtime.   B Complex Vitamins (B COMPLEX 100 PO) Take 1 tablet by mouth every morning.    Calcium Carbonate-Vitamin D (CALCIUM 600+D PO) Take 1 tablet by mouth in the morning and at bedtime.    carboxymethylcellulose (REFRESH PLUS) 0.5 % SOLN Place 1 drop into both eyes 3 (three) times daily as needed (dry eyes).    cetirizine (ZYRTEC) 10 MG tablet Take 10 mg by mouth at bedtime.    Cholecalciferol (VITAMIN D) 125 MCG (5000 UT) CAPS Take 5,000 Units by mouth every morning.    clindamycin (CLEOCIN T) 1 % lotion Apply 1 application topically daily as needed (eczema).    Coenzyme Q-10 100 MG capsule Take 100 mg by mouth every morning.    diclofenac sodium (VOLTAREN) 1 % GEL Apply 4 g topically 4 (four) times daily as needed (joint pain).    diltiazem (CARDIZEM CD) 240 MG 24 hr capsule TAKE 1 CAPSULE BY MOUTH EVERY DAY   diltiazem (CARDIZEM) 30 MG tablet Take one tablet by mouth every 4 hours as needed for  heart rate greater than 100, top number blood pressure needs to be above 100   docusate sodium (COLACE) 250 MG capsule Take 250 mg by mouth at bedtime.   ELIQUIS 5 MG TABS tablet TAKE 1 TABLET BY MOUTH  TWICE DAILY   flecainide (TAMBOCOR) 50 MG tablet TAKE 1 TABLET BY MOUTH  TWICE DAILY   fluocinonide (LIDEX) 0.05 % external solution Apply 1 application topically daily as needed  (eczema on scalp).    gabapentin (NEURONTIN) 300 MG capsule TAKE 1 CAPSULE BY MOUTH 3  TIMES DAILY   GRAPE SEED EXTRACT PO Take 100 mg by mouth 2 (two) times daily.    levothyroxine (SYNTHROID) 50 MCG tablet TAKE 1 TABLET BY MOUTH  DAILY   loratadine (CLARITIN) 10 MG tablet Take 10 mg by mouth every morning.    Magnesium Bisglycinate (MAG GLYCINATE PO) Take 400 mg by mouth at bedtime.    methocarbamol (ROBAXIN) 500 MG tablet TAKE 1 TABLET BY MOUTH EVERY 8 HOURS AS NEEDED FOR MUSCLE SPASMS.   mirtazapine (REMERON) 15 MG tablet TAKE 1 TABLET BY MOUTH AT  BEDTIME   morphine (MS CONTIN) 15 MG 12 hr tablet Take 1 tablet (15 mg total) by mouth 3 (three) times daily. Please Fill on 05/14/2021, Ms. Koopman leaving for vacation. If you have any questions please call [phone]   oxybutynin (DITROPAN-XL) 10 MG 24 hr tablet Take 10 mg by mouth at bedtime.   oxymetazoline (AFRIN) 0.05 % nasal spray Place 1 spray into both nostrils 2 (two) times daily as needed for congestion.   RABEprazole (ACIPHEX) 20 MG tablet TAKE 1 TABLET BY MOUTH EVERY DAY   senna (SENOKOT) 8.6 MG TABS tablet Take 4 tablets by mouth at bedtime.   tretinoin (RETIN-A) 0.05 % cream Apply 1 application topically daily as needed (acne).    No facility-administered encounter medications on file as of 05/31/2021.    Allergies (verified) Cyclobenzaprine hcl, Rizatriptan benzoate, Sumatriptan, Tizanidine, Aleve [naproxen sodium], Clarithromycin, Doxycycline, Duloxetine, Egg [eggs or egg-derived products], Keflex [cephalexin], Metronidazole, Oxycodone hcl, Oxycontin [oxycodone hcl], Penicillins, Rosuvastatin, Venlafaxine, Voltaren [diclofenac sodium], Septra [sulfamethoxazole-trimethoprim], and Tramadol   History: Past Medical History:  Diagnosis Date   B12 deficiency    Complication of anesthesia    slow to wake up    Depression    Diabetes mellitus type 2, diet-controlled ([place]) 07/24/2018   Fibromyalgia    Generalized hyperhidrosis  04/17/2014   GERD (gastroesophageal reflux disease)    History of rheumatic fever as a child    Hyperlipemia    Hypertension    denies htn on 05/09/16   HYPOTHYROIDISM 05/29/2007   Irritable bowel syndrome    Migraines    hx of    OSA (obstructive sleep apnea) 06/26/2019   Senile calcific aortic valve sclerosis    Aortic Sclerosis-no stenosis   Past Surgical History:  Procedure Laterality Date   55 HOUR [place] STUDY N/A 01/04/2016   Procedure: 24 HOUR [place] STUDY;  Surgeon: Manus Gunning, MD;  Location: [facility];  Service: Gastroenterology;  Laterality: N/A;   ABDOMINAL HYSTERECTOMY  1988   ADENOIDECTOMY     ANTERIOR CERVICAL DECOMP/DISCECTOMY FUSION  2009, 1993   first procedure in ~ 1993/Dr [place] N/A 04/03/2019   Procedure: CARDIOVERSION;  Surgeon: Dorothy Spark, MD;  Location: [facility];  Service: Cardiovascular;  Laterality: N/A;   CHOLECYSTECTOMY  2005   ESOPHAGEAL MANOMETRY N/A 01/04/2016  Procedure: ESOPHAGEAL MANOMETRY (EM) 24 HR PH;  Surgeon: Manus Gunning, MD;  Location: [facility];  Service: Gastroenterology;  Laterality: N/A;   ESOPHAGOGASTRODUODENOSCOPY (EGD) WITH PROPOFOL N/A 05/17/2016   Procedure: ESOPHAGOGASTRODUODENOSCOPY (EGD) WITH PROPOFOL;  Surgeon: Manus Gunning, MD;  Location: [facility];  Service: Gastroenterology;  Laterality: N/A;   NASAL SINUS SURGERY  2006   SAVORY DILATION N/A 05/17/2016   Procedure: SAVORY DILATION;  Surgeon: Manus Gunning, MD;  Location: [facility];  Service: Gastroenterology;  Laterality: N/A;   TEE WITHOUT CARDIOVERSION N/A 04/03/2019   Procedure: TRANSESOPHAGEAL ECHOCARDIOGRAM (TEE)-with DCCV;  Surgeon: Dorothy Spark, MD;  [facility];; EF 55 to 60%.  No LVH.  Normal RV.  Moderate LA dilation.  No thrombus.  Mild RA dilation.  Mild to moderate TR.  Mild aortic valve sclerosis, no stenosis.  Moderately elevated PA  pressures (47 mmHg)   TONSILLECTOMY AND ADENOIDECTOMY     TRANSTHORACIC ECHOCARDIOGRAM  2009   Normal LV size and function. EF 60-65%. No regional wall motion and amount is. Mild aortic sclerosis, no stenosis   TUBAL LIGATION  1979   Family History  Problem Relation Age of Onset   Breast cancer Mother    Diabetes Mother    Heart disease Mother    Allergies Mother    Diabetes Father    Heart disease Father    Emphysema Father    Allergies Father    Stomach cancer Maternal Uncle    Breast cancer Paternal Aunt    Colon cancer Neg Hx    Social History   Socioeconomic History   Marital status: Married    Spouse name: Not on file   Number of children: Not on file   Years of education: Not on file   Highest education level: Not on file  Occupational History   Occupation: Radiation protection practitioner   Occupation: retired  Tobacco Use   Smoking status: Never   Smokeless tobacco: Never  Vaping Use   Vaping Use: Never used  Substance and Sexual Activity   Alcohol use: Yes    Alcohol/week: 1.0 standard drink    Types: 1 Glasses of wine per week    Comment: rare   Drug use: Never   Sexual activity: Not Currently  Other Topics Concern   Not on file  Social History Narrative   Not on file   Social Determinants of Health   Financial Resource Strain: Low Risk    Difficulty of Paying Living Expenses: Not hard at all  Food Insecurity: No Food Insecurity   Worried About Charity fundraiser in the Last Year: Never true   [place] in the Last Year: Never true  Transportation Needs: No Transportation Needs   Lack of Transportation (Medical): No   Lack of Transportation (Non-Medical): No  Physical Activity: Sufficiently Active   Days of Exercise per Week: 7 days   Minutes of Exercise per Session: 30 min  Stress: No Stress Concern Present   Feeling of Stress : Not at all  Social Connections: Socially Integrated   Frequency of Communication with Friends and Family: Three times a week    Frequency of Social Gatherings with Friends and Family: More than three times a week   Attends Religious Services: More than 4 times per year   Active Member of [employer] or Organizations: Yes   Attends Music therapist: More than 4 times per year   Marital Status: Married    Tobacco Counseling Counseling  given: Not Answered   Clinical Intake:  Pre-visit preparation completed: Yes  Pain : No/denies pain     BMI - recorded: 35.35 Nutritional Status: BMI > 30  Obese Nutritional Risks: None Diabetes: Yes CBG done?: No (visit completed over the phone) Did pt. bring in CBG monitor from home?: No  How often do you need to have someone help you when you read instructions, pamphlets, or other written materials from your doctor or pharmacy?: 1 - Never  Diabetes:  Is the patient diabetic?  Yes  If diabetic, was a CBG obtained today?  No , visit completed over the phone. Did the patient bring in their glucometer from home?  No , visit completed over the phone. How often do you monitor your CBG's? none.   Financial Strains and Diabetes Management:  Are you having any financial strains with the device, your supplies or your medication? No .  Does the patient want to be seen by Chronic Care Management for management of their diabetes?  No  Would the patient like to be referred to a Nutritionist or for Diabetic Management?  No   Diabetic Exams:  Diabetic Eye Exam: Completed 5/522.   Diabetic Foot Exam: Due patient has appointment schedule with the PCP.    Interpreter Needed?: No  Information entered by :: Orrin Brigham LPN   Activities of Daily Living In your present state of health, do you have any difficulty performing the following activities: 05/31/2021  Hearing? N  Vision? N  Difficulty concentrating or making decisions? N  Walking or climbing stairs? Y  Comment patient had bunion surgery  Dressing or bathing? N  Doing errands, shopping? N  Preparing Food  and eating ? N  Using the Toilet? N  In the past six months, have you accidently leaked urine? N  Do you have problems with loss of bowel control? N  Managing your Medications? N  Managing your Finances? N  Housekeeping or managing your Housekeeping? N  Some recent data might be hidden    Patient Care Team: Owens Loffler, MD as PCP - General [name], MD as PCP - Cardiology (Cardiology) Letta Pate Luanna Salk, MD as Consulting Physician (Physical Medicine and Rehabilitation) Armbruster, Carlota Raspberry, MD as Consulting Physician (Gastroenterology) [name], MD as Consulting Physician (Cardiology) Chesley Mires, MD as Consulting Physician (Pulmonary Disease) Debbora Dus, [facility] as Pharmacist (Pharmacist)  Indicate any recent Medical Services you may have received from other than [facility] providers in the past year (date may be approximate).     Assessment:   This is a routine wellness examination for [place].  Hearing/Vision screen Hearing Screening - Comments:: No issues Vision Screening - Comments:: Last exam 10/29/20, Dr. Marvel Plan, wears reading glasses  Dietary issues and exercise activities discussed: Current Exercise Habits: Home exercise routine, Type of exercise: walking;stretching, Time (Minutes): 30, Frequency (Times/Week): 7, Weekly Exercise (Minutes/Week): 210, Intensity: Mild   Goals Addressed             This Visit's Progress    Patient Stated       Would like to eat healthier       Depression Screen PHQ 2/9 Scores 05/31/2021 05/12/2021 04/29/2021 01/26/2021 10/27/2020 08/12/2020 06/11/2020  PHQ - 2 Score 0 0 2 2 2  0 0  PHQ- 9 Score - - - - - - -    Fall Risk Fall Risk  05/31/2021 05/12/2021 04/29/2021 04/06/2021 01/26/2021  Falls in the past year? 0 0 0 0 0  Comment - - - - -  Number falls in past yr: 0 0 0 - 0  Injury with Fall? 0 - 0 - 0  Comment - - - - -  Risk for fall due to : No Fall Risks - - - -  Follow up Falls prevention discussed - - - -     FALL RISK PREVENTION PERTAINING TO THE HOME:  Any stairs in or around the home? Yes  If so, are there any without handrails? No  Home free of loose throw rugs in walkways, pet beds, electrical cords, etc? Yes  Adequate lighting in your home to reduce risk of falls? Yes   ASSISTIVE DEVICES UTILIZED TO PREVENT FALLS:  Life alert? No  Use of a cane, walker or w/c? No  Grab bars in the bathroom? Yes  Shower chair or bench in shower? Yes  Elevated toilet seat or a handicapped toilet? Yes   TIMED UP AND GO:  Was the test performed? No , visit completed over the phone    Cognitive Function: Normal cognitive status assessed by this Nurse Health Advisor. No abnormalities found.   MMSE - Mini Mental State Exam 02/12/2020 01/18/2019 01/10/2018 01/02/2017  Orientation to time 5 5 5 5   Orientation to Place 5 5 5 5   Registration 3 3 3 3   Attention/ Calculation 5 5 0 0  Recall 3 3 3 3   Language- name 2 objects - 0 0 0  Language- repeat 1 1 1 1   Language- follow 3 step command - 0 3 3  Language- read & follow direction - 0 0 0  Write a sentence - 0 0 0  Copy design - 0 0 0  Total score - 22 20 20         Immunizations Immunization History  Administered Date(s) Administered   PFIZER(Purple Top)SARS-COV-2 Vaccination 08/22/2019, 09/18/2019   Pneumococcal Conjugate-13 01/02/2017   Pneumococcal Polysaccharide-23 05/06/2013, 07/25/2018   Td 06/28/2003, 10/16/2013   Zoster, Live 11/07/2012    TDAP status: Up to date  Flu Vaccine status: Declined, Education has been provided regarding the importance of this vaccine but patient still declined. Advised may receive this vaccine at local pharmacy or Health Dept. Aware to provide a copy of the vaccination record if obtained from local pharmacy or Health Dept. Verbalized acceptance and understanding.  Pneumococcal vaccine status: Up to date  Covid-19 vaccine status: Information provided on how to obtain vaccines.   Qualifies for Shingles  Vaccine? Yes   Zostavax completed Yes   Shingrix Completed?: No.    Education has been provided regarding the importance of this vaccine. Patient has been advised to call insurance company to determine out of pocket expense if they have not yet received this vaccine. Advised may also receive vaccine at local pharmacy or Health Dept. Verbalized acceptance and understanding.  Screening Tests Health Maintenance  Topic Date Due   Zoster Vaccines- Shingrix (1 of 2) Never done   FOOT EXAM  11/30/2018   COVID-19 Vaccine (3 - Pfizer risk series) 10/16/2019   INFLUENZA VACCINE  01/25/2021   URINE MICROALBUMIN  03/12/2021   HEMOGLOBIN A1C  06/22/2021   Fecal DNA (Cologuard)  08/05/2021   OPHTHALMOLOGY EXAM  10/29/2021   MAMMOGRAM  12/23/2022   TETANUS/TDAP  10/17/2023   Pneumonia Vaccine 32+ Years old  Completed   DEXA SCAN  Completed   Hepatitis C Screening  Completed   HPV VACCINES  Aged Out    Health Maintenance  Health Maintenance Due  Topic Date Due   Zoster Vaccines-  Shingrix (1 of 2) Never done   FOOT EXAM  11/30/2018   COVID-19 Vaccine (3 - Pfizer risk series) 10/16/2019   INFLUENZA VACCINE  01/25/2021   URINE MICROALBUMIN  03/12/2021    Colorectal cancer screening: Type of screening: Cologuard. Completed 08/05/18. Repeat every 3 years  Mammogram status: Completed 12/22/20. Repeat every year  Bone Density status: Ordered 05/28/21. Pt provided with contact info and advised to call to schedule appt.  Lung Cancer Screening: (Low Dose CT Chest recommended if Age 59-80 years, 30 pack-year currently smoking OR have quit w/in 15years.) does not qualify.     Additional Screening:  Hepatitis C Screening: does qualify; Completed 04/01/19  Vision Screening: Recommended annual ophthalmology exams for early detection of glaucoma and other disorders of the eye. Is the patient up to date with their annual eye exam?  Yes  Who is the provider or what is the name of the office in which the  patient attends annual eye exams? Dr. Marvel Plan    Dental Screening: Recommended annual dental exams for proper oral hygiene  Community Resource Referral / Chronic Care Management: CRR required this visit?  No   CCM required this visit?  No      Plan:     I have personally reviewed and noted the following in the patient's chart:   Medical and social history Use of alcohol, tobacco or illicit drugs  Current medications and supplements including opioid prescriptions.  Functional ability and status Nutritional status Physical activity Advanced directives List of other physicians Hospitalizations, surgeries, and ER visits in previous 12 months Vitals Screenings to include cognitive, depression, and falls Referrals and appointments  In addition, I have reviewed and discussed with patient certain preventive protocols, quality metrics, and best practice recommendations. A written personalized care plan for preventive services as well as general preventive health recommendations were provided to patient.   Due to this being a telephonic visit, the after visit summary with patients personalized plan was offered to patient via mail or my-chart. Patient would like to access on my-chart.    Loma Messing, LPN   24/09/100   Nurse Health Advisor  Nurse Notes: none

## 2021-05-31 ENCOUNTER — Ambulatory Visit (INDEPENDENT_AMBULATORY_CARE_PROVIDER_SITE_OTHER): Payer: Medicare Other

## 2021-05-31 VITALS — Ht 65.0 in | Wt 212.0 lb

## 2021-05-31 DIAGNOSIS — Z0000 Encounter for general adult medical examination without abnormal findings: Secondary | ICD-10-CM | POA: Diagnosis not present

## 2021-05-31 DIAGNOSIS — Z780 Asymptomatic menopausal state: Secondary | ICD-10-CM | POA: Diagnosis not present

## 2021-05-31 NOTE — Patient Instructions (Signed)
[name] , Thank you for taking time to complete your Medicare Wellness Visit. I appreciate your ongoing commitment to your health goals. Please review the following plan we discussed and let me know if I can assist you in the future.   Screening recommendations/referrals: Colonoscopy: up to date, cologuard completed 08/05/18, due 08/05/21 Mammogram: up to date , completed 12/22/20, due 12/22/21 Bone Density: due, last completed 03/05/18, ordered today , someone will call to schedule an appointment Recommended yearly ophthalmology/optometry visit for glaucoma screening and checkup Recommended yearly dental visit for hygiene and checkup  Vaccinations: Influenza vaccine: Declined today, please call office or local pharmacy to schedule if you change your mind Pneumococcal vaccine: up to date Tdap vaccine: up to date, completed 10/16/13, due 10/17/23 Shingles vaccine: Discuss with your local pharmacy   Covid-19: newest booster available at your local pharmacy  Advanced directives: copy on file  Conditions/risks identified: see problem list  Next appointment: Follow up in one year for your annual wellness visit 06/06/22 @ 11:15am, this will be a telephone visit.    Preventive Care 40 Years and Older, Female Preventive care refers to lifestyle choices and visits with your health care provider that can promote health and wellness. What does preventive care include? A yearly physical exam. This is also called an annual well check. Dental exams once or twice a year. Routine eye exams. Ask your health care provider how often you should have your eyes checked. Personal lifestyle choices, including: Daily care of your teeth and gums. Regular physical activity. Eating a healthy diet. Avoiding tobacco and drug use. Limiting alcohol use. Practicing safe sex. Taking low-dose aspirin every day. Taking vitamin and mineral supplements as recommended by your health care provider. What happens during an  annual well check? The services and screenings done by your health care provider during your annual well check will depend on your age, overall health, lifestyle risk factors, and family history of disease. Counseling  Your health care provider may ask you questions about your: Alcohol use. Tobacco use. Drug use. Emotional well-being. Home and relationship well-being. Sexual activity. Eating habits. History of falls. Memory and ability to understand (cognition). Work and work Statistician. Reproductive health. Screening  You may have the following tests or measurements: Height, weight, and BMI. Blood pressure. Lipid and cholesterol levels. These may be checked every 5 years, or more frequently if you are over 59 years old. Skin check. Lung cancer screening. You may have this screening every year starting at age 23 if you have a 30-pack-year history of smoking and currently smoke or have quit within the past 15 years. Fecal occult blood test (FOBT) of the stool. You may have this test every year starting at age 81. Flexible sigmoidoscopy or colonoscopy. You may have a sigmoidoscopy every 5 years or a colonoscopy every 10 years starting at age 30. Hepatitis C blood test. Hepatitis B blood test. Sexually transmitted disease (STD) testing. Diabetes screening. This is done by checking your blood sugar (glucose) after you have not eaten for a while (fasting). You may have this done every 1-3 years. Bone density scan. This is done to screen for osteoporosis. You may have this done starting at age 19. Mammogram. This may be done every 1-2 years. Talk to your health care provider about how often you should have regular mammograms. Talk with your health care provider about your test results, treatment options, and if necessary, the need for more tests. Vaccines  Your health care provider may recommend  certain vaccines, such as: Influenza vaccine. This is recommended every year. Tetanus,  diphtheria, and acellular pertussis (Tdap, Td) vaccine. You may need a Td booster every 10 years. Zoster vaccine. You may need this after age 61. Pneumococcal 13-valent conjugate (PCV13) vaccine. One dose is recommended after age 28. Pneumococcal polysaccharide (PPSV23) vaccine. One dose is recommended after age 67. Talk to your health care provider about which screenings and vaccines you need and how often you need them. This information is not intended to replace advice given to you by your health care provider. Make sure you discuss any questions you have with your health care provider. Document Released: 07/10/2015 Document Revised: 03/02/2016 Document Reviewed: 04/14/2015 Elsevier Interactive Patient Education  2017 [place] Prevention in the Home Falls can cause injuries. They can happen to people of all ages. There are many things you can do to make your home safe and to help prevent falls. What can I do on the outside of my home? Regularly fix the edges of walkways and driveways and fix any cracks. Remove anything that might make you trip as you walk through a door, such as a raised step or threshold. Trim any bushes or trees on the path to your home. Use bright outdoor lighting. Clear any walking paths of anything that might make someone trip, such as rocks or tools. Regularly check to see if handrails are loose or broken. Make sure that both sides of any steps have handrails. Any raised decks and porches should have guardrails on the edges. Have any leaves, snow, or ice cleared regularly. Use sand or salt on walking paths during winter. Clean up any spills in your garage right away. This includes oil or grease spills. What can I do in the bathroom? Use night lights. Install grab bars by the toilet and in the tub and shower. Do not use towel bars as grab bars. Use non-skid mats or decals in the tub or shower. If you need to sit down in the shower, use a plastic,  non-slip stool. Keep the floor dry. Clean up any water that spills on the floor as soon as it happens. Remove soap buildup in the tub or shower regularly. Attach bath mats securely with double-sided non-slip rug tape. Do not have throw rugs and other things on the floor that can make you trip. What can I do in the bedroom? Use night lights. Make sure that you have a light by your bed that is easy to reach. Do not use any sheets or blankets that are too big for your bed. They should not hang down onto the floor. Have a firm chair that has side arms. You can use this for support while you get dressed. Do not have throw rugs and other things on the floor that can make you trip. What can I do in the kitchen? Clean up any spills right away. Avoid walking on wet floors. Keep items that you use a lot in easy-to-reach places. If you need to reach something above you, use a strong step stool that has a grab bar. Keep electrical cords out of the way. Do not use floor polish or wax that makes floors slippery. If you must use wax, use non-skid floor wax. Do not have throw rugs and other things on the floor that can make you trip. What can I do with my stairs? Do not leave any items on the stairs. Make sure that there are handrails on both sides of the  stairs and use them. Fix handrails that are broken or loose. Make sure that handrails are as long as the stairways. Check any carpeting to make sure that it is firmly attached to the stairs. Fix any carpet that is loose or worn. Avoid having throw rugs at the top or bottom of the stairs. If you do have throw rugs, attach them to the floor with carpet tape. Make sure that you have a light switch at the top of the stairs and the bottom of the stairs. If you do not have them, ask someone to add them for you. What else can I do to help prevent falls? Wear shoes that: Do not have high heels. Have rubber bottoms. Are comfortable and fit you well. Are closed  at the toe. Do not wear sandals. If you use a stepladder: Make sure that it is fully opened. Do not climb a closed stepladder. Make sure that both sides of the stepladder are locked into place. Ask someone to hold it for you, if possible. Clearly mark and make sure that you can see: Any grab bars or handrails. First and last steps. Where the edge of each step is. Use tools that help you move around (mobility aids) if they are needed. These include: Canes. Walkers. Scooters. Crutches. Turn on the lights when you go into a dark area. Replace any light bulbs as soon as they burn out. Set up your furniture so you have a clear path. Avoid moving your furniture around. If any of your floors are uneven, fix them. If there are any pets around you, be aware of where they are. Review your medicines with your doctor. Some medicines can make you feel dizzy. This can increase your chance of falling. Ask your doctor what other things that you can do to help prevent falls. This information is not intended to replace advice given to you by your health care provider. Make sure you discuss any questions you have with your health care provider. Document Released: 04/09/2009 Document Revised: 11/19/2015 Document Reviewed: 07/18/2014 Elsevier Interactive Patient Education  2017 [employer].

## 2021-06-02 ENCOUNTER — Other Ambulatory Visit: Payer: Medicare Other

## 2021-06-02 ENCOUNTER — Encounter: Payer: Medicare Other | Admitting: Family Medicine

## 2021-06-07 ENCOUNTER — Other Ambulatory Visit: Payer: Self-pay | Admitting: Family Medicine

## 2021-06-07 DIAGNOSIS — E559 Vitamin D deficiency, unspecified: Secondary | ICD-10-CM

## 2021-06-07 DIAGNOSIS — E538 Deficiency of other specified B group vitamins: Secondary | ICD-10-CM

## 2021-06-07 DIAGNOSIS — E785 Hyperlipidemia, unspecified: Secondary | ICD-10-CM

## 2021-06-07 DIAGNOSIS — E039 Hypothyroidism, unspecified: Secondary | ICD-10-CM

## 2021-06-07 DIAGNOSIS — E119 Type 2 diabetes mellitus without complications: Secondary | ICD-10-CM

## 2021-06-07 DIAGNOSIS — E038 Other specified hypothyroidism: Secondary | ICD-10-CM

## 2021-06-07 DIAGNOSIS — Z79899 Other long term (current) drug therapy: Secondary | ICD-10-CM

## 2021-06-09 ENCOUNTER — Encounter: Payer: Medicare Other | Admitting: Family Medicine

## 2021-06-10 ENCOUNTER — Other Ambulatory Visit: Payer: Self-pay

## 2021-06-10 ENCOUNTER — Encounter: Payer: Self-pay | Admitting: Registered Nurse

## 2021-06-10 ENCOUNTER — Encounter: Payer: Medicare Other | Attending: Physical Medicine & Rehabilitation | Admitting: Registered Nurse

## 2021-06-10 VITALS — BP 132/78 | HR 65 | Temp 99.3°F | Ht 65.0 in | Wt 209.0 lb

## 2021-06-10 DIAGNOSIS — G894 Chronic pain syndrome: Secondary | ICD-10-CM | POA: Insufficient documentation

## 2021-06-10 DIAGNOSIS — M170 Bilateral primary osteoarthritis of knee: Secondary | ICD-10-CM | POA: Diagnosis not present

## 2021-06-10 DIAGNOSIS — Z5181 Encounter for therapeutic drug level monitoring: Secondary | ICD-10-CM | POA: Insufficient documentation

## 2021-06-10 DIAGNOSIS — M62830 Muscle spasm of back: Secondary | ICD-10-CM | POA: Diagnosis not present

## 2021-06-10 DIAGNOSIS — M546 Pain in thoracic spine: Secondary | ICD-10-CM | POA: Insufficient documentation

## 2021-06-10 DIAGNOSIS — G8929 Other chronic pain: Secondary | ICD-10-CM | POA: Insufficient documentation

## 2021-06-10 DIAGNOSIS — Z79891 Long term (current) use of opiate analgesic: Secondary | ICD-10-CM | POA: Insufficient documentation

## 2021-06-10 MED ORDER — MORPHINE SULFATE ER 15 MG PO TBCR: 15.0000 mg | EXTENDED_RELEASE_TABLET | Freq: Three times a day (TID) | ORAL | 0 refills | Status: DC

## 2021-06-10 NOTE — Progress Notes (Signed)
Subjective:    Patient ID: [name], female    DOB: [birth year], 70 y.o.   MRN: [id number]  HPI: [name] is a 70 y.o. female who returns for follow up appointment for chronic pain and medication refill. She states her  pain is located in her mid -back and bilateral knee pain L>R. Ms. Millar asked about left knee injection, lleft knee X-ray was reviewed and she will be scheduled with Dr Letta Pate for left knee injection, she verbalizes understanding. He rates his pain 5. Her  current exercise regime is walking and performing stretching exercises.  Ms. Meriweather Morphine equivalent is 45.00 MME.   UDS was ordered today.      Pain Inventory Average Pain 5 Pain Right Now 5 My pain is constant, sharp, burning, and aching  In the last 24 hours, has pain interfered with the following? General activity 7 Relation with others 5 Enjoyment of life 5 What TIME of day is your pain at its worst? daytime and night Sleep (in general) Good  Pain is worse with: walking, standing, and some activites Pain improves with: rest, heat/ice, medication, and injections Relief from Meds: 4  Family History  Problem Relation Age of Onset   Breast cancer Mother    Diabetes Mother    Heart disease Mother    Allergies Mother    Diabetes Father    Heart disease Father    Emphysema Father    Allergies Father    Stomach cancer Maternal Uncle    Breast cancer Paternal Aunt    Colon cancer Neg Hx    Social History   Socioeconomic History   Marital status: Married    Spouse name: Not on file   Number of children: Not on file   Years of education: Not on file   Highest education level: Not on file  Occupational History   Occupation: Radiation protection practitioner   Occupation: retired  Tobacco Use   Smoking status: Never   Smokeless tobacco: Never  Vaping Use   Vaping Use: Never used  Substance and Sexual Activity   Alcohol use: Yes    Alcohol/week: 1.0 standard drink    Types: 1 Glasses of wine per week     Comment: rare   Drug use: Never   Sexual activity: Not Currently  Other Topics Concern   Not on file  Social History Narrative   Not on file   Social Determinants of Health   Financial Resource Strain: Low Risk    Difficulty of Paying Living Expenses: Not hard at all  Food Insecurity: No Food Insecurity   Worried About Charity fundraiser in the Last Year: Never true   [place] in the Last Year: Never true  Transportation Needs: No Transportation Needs   Lack of Transportation (Medical): No   Lack of Transportation (Non-Medical): No  Physical Activity: Sufficiently Active   Days of Exercise per Week: 7 days   Minutes of Exercise per Session: 30 min  Stress: No Stress Concern Present   Feeling of Stress : Not at all  Social Connections: Socially Integrated   Frequency of Communication with Friends and Family: Three times a week   Frequency of Social Gatherings with Friends and Family: More than three times a week   Attends Religious Services: More than 4 times per year   Active Member of Clubs or Organizations: Yes   Attends Archivist Meetings: More than 4 times per year   Marital Status:  Married   Past Surgical History:  Procedure Laterality Date   31 HOUR [place] STUDY N/A 01/04/2016   Procedure: 24 HOUR PH STUDY;  Surgeon: Manus Gunning, MD;  Location: [facility];  Service: Gastroenterology;  Laterality: N/A;   ABDOMINAL HYSTERECTOMY  1988   ADENOIDECTOMY     ANTERIOR CERVICAL DECOMP/DISCECTOMY FUSION  2009, 1993   first procedure in ~ 1993/Dr [place] N/A 04/03/2019   Procedure: CARDIOVERSION;  Surgeon: Dorothy Spark, MD;  Location: [facility];  Service: Cardiovascular;  Laterality: N/A;   CHOLECYSTECTOMY  2005   ESOPHAGEAL MANOMETRY N/A 01/04/2016   Procedure: ESOPHAGEAL MANOMETRY (EM) 24 HR PH;  Surgeon: Manus Gunning, MD;  Location: [facility];  Service: Gastroenterology;   Laterality: N/A;   ESOPHAGOGASTRODUODENOSCOPY (EGD) WITH PROPOFOL N/A 05/17/2016   Procedure: ESOPHAGOGASTRODUODENOSCOPY (EGD) WITH PROPOFOL;  Surgeon: Manus Gunning, MD;  Location: [facility];  Service: Gastroenterology;  Laterality: N/A;   NASAL SINUS SURGERY  2006   SAVORY DILATION N/A 05/17/2016   Procedure: SAVORY DILATION;  Surgeon: Manus Gunning, MD;  Location: [facility];  Service: Gastroenterology;  Laterality: N/A;   TEE WITHOUT CARDIOVERSION N/A 04/03/2019   Procedure: TRANSESOPHAGEAL ECHOCARDIOGRAM (TEE)-with DCCV;  Surgeon: Dorothy Spark, MD;  Location: [facility] END[facility];; EF 55 to 60%.  No LVH.  Normal RV.  Moderate LA dilation.  No thrombus.  Mild RA dilation.  Mild to moderate TR.  Mild aortic valve sclerosis, no stenosis.  Moderately elevated PA pressures (47 mmHg)   TONSILLECTOMY AND ADENOIDECTOMY     TRANSTHORACIC ECHOCARDIOGRAM  2009   Normal LV size and function. EF 60-65%. No regional wall motion and amount is. Mild aortic sclerosis, no stenosis   TUBAL LIGATION  1979   Past Surgical History:  Procedure Laterality Date   64 HOUR [place] STUDY N/A 01/04/2016   Procedure: 24 HOUR PH STUDY;  Surgeon: Manus Gunning, MD;  Location: [facility];  Service: Gastroenterology;  Laterality: N/A;   ABDOMINAL HYSTERECTOMY  1988   ADENOIDECTOMY     ANTERIOR CERVICAL DECOMP/DISCECTOMY FUSION  2009, 1993   first procedure in ~ 1993/Dr [place] N/A 04/03/2019   Procedure: CARDIOVERSION;  Surgeon: Dorothy Spark, MD;  Location: [facility];  Service: Cardiovascular;  Laterality: N/A;   CHOLECYSTECTOMY  2005   ESOPHAGEAL MANOMETRY N/A 01/04/2016   Procedure: ESOPHAGEAL MANOMETRY (EM) 24 HR PH;  Surgeon: Manus Gunning, MD;  Location: [facility];  Service: Gastroenterology;  Laterality: N/A;   ESOPHAGOGASTRODUODENOSCOPY (EGD) WITH PROPOFOL N/A 05/17/2016   Procedure: ESOPHAGOGASTRODUODENOSCOPY  (EGD) WITH PROPOFOL;  Surgeon: Manus Gunning, MD;  Location: [facility];  Service: Gastroenterology;  Laterality: N/A;   NASAL SINUS SURGERY  2006   SAVORY DILATION N/A 05/17/2016   Procedure: SAVORY DILATION;  Surgeon: Manus Gunning, MD;  Location: [facility];  Service: Gastroenterology;  Laterality: N/A;   TEE WITHOUT CARDIOVERSION N/A 04/03/2019   Procedure: TRANSESOPHAGEAL ECHOCARDIOGRAM (TEE)-with DCCV;  Surgeon: Dorothy Spark, MD;  Location: [facility] ENDOSCOPY;; EF 55 to 60%.  No LVH.  Normal RV.  Moderate LA dilation.  No thrombus.  Mild RA dilation.  Mild to moderate TR.  Mild aortic valve sclerosis, no stenosis.  Moderately elevated PA pressures (47 mmHg)   TONSILLECTOMY AND ADENOIDECTOMY     TRANSTHORACIC ECHOCARDIOGRAM  2009   Normal LV size and  function. EF 60-65%. No regional wall motion and amount is. Mild aortic sclerosis, no stenosis   TUBAL LIGATION  1979   Past Medical History:  Diagnosis Date   B12 deficiency    Complication of anesthesia    slow to wake up    Depression    Diabetes mellitus type 2, diet-controlled ([place]) 07/24/2018   Fibromyalgia    Generalized hyperhidrosis 04/17/2014   GERD (gastroesophageal reflux disease)    History of rheumatic fever as a child    Hyperlipemia    Hypertension    denies htn on 05/09/16   HYPOTHYROIDISM 05/29/2007   Irritable bowel syndrome    Migraines    hx of    OSA (obstructive sleep apnea) 06/26/2019   Senile calcific aortic valve sclerosis    Aortic Sclerosis-no stenosis   BP 132/78 (BP Location: Left Arm, Patient Position: Sitting, Cuff Size: Normal)    Pulse 65    Temp 99.3 F (37.4 C)    Ht 5\' 5"  (1.651 m)    Wt 209 lb (94.8 kg)    SpO2 97%    BMI 34.78 kg/m   Opioid Risk Score:   Fall Risk Score:  `1  Depression screen PHQ 2/9  Depression screen [facility] 2/9 06/10/2021 05/31/2021 05/12/2021 04/29/2021 01/26/2021 10/27/2020 08/12/2020  Decreased Interest 1 0 0 1 1 1  0  Down, Depressed, Hopeless 1 0 0 1  1 1  0  PHQ - 2 Score 2 0 0 2 2 2  0  Altered sleeping - - - - - - -  Tired, decreased energy - - - - - - -  Change in appetite - - - - - - -  Feeling bad or failure about yourself  - - - - - - -  Trouble concentrating - - - - - - -  Moving slowly or fidgety/restless - - - - - - -  Suicidal thoughts - - - - - - -  PHQ-9 Score - - - - - - -  Difficult doing work/chores - - - - - - -  Some recent data might be hidden    Review of Systems  Musculoskeletal:  Positive for back pain.       Left knee pain  All other systems reviewed and are negative.     Objective:   Physical Exam Vitals and nursing note reviewed.  Constitutional:      Appearance: Normal appearance.  Cardiovascular:     Rate and Rhythm: Normal rate and regular rhythm.     Pulses: Normal pulses.     Heart sounds: Normal heart sounds.  Pulmonary:     Effort: Pulmonary effort is normal.     Breath sounds: Normal breath sounds.  Musculoskeletal:     Cervical back: Normal range of motion and neck supple.     Comments: Normal Muscle Bulk and Muscle Testing Reveals:  Upper Extremities: Full ROM and Muscle Strength 5/5 Thoracic Paraspinal Tenderness: T-7- T-9  Lower Extremities: Full ROM and Muscle Strength 5/5 Arises from Chair with ease Narrow Based  Gait     Skin:    General: Skin is warm and dry.  Neurological:     Mental Status: She is alert and oriented to person, place, and time.  Psychiatric:        Mood and Affect: Mood normal.        Behavior: Behavior normal.         Assessment & Plan:  1. Cervical postlaminectomy syndrome:/ Cervicalgia/ Cervical  Radiculitis/ with neck and shoulder pain/ Chronic Midline Thoracic Pain.  Continue exercise routine and using heat therapy. 06/10/2021 Continue : current medication regimen. Refilled: MS Contin 15 mg one tablet three times a day #90.   We will continue the opioid monitoring program, this consists of regular clinic visits, examinations, urine drug screen,  pill counts as well as use of [place] Controlled Substance Reporting system. A 12 month History has been reviewed on the [place] Controlled Substance Reporting System on 06/10/2021.   2. Fibromyalgia/ Neuropathy: Continue current medication regimen with Gabapentin, activity and exercise regime. 12/15 /2022. 3. Bilateral Knee Pain/ Mild Degenerative Changes L>R: Scheduled for Left Knee Injection with Dr Letta Pate, she verbalizes understanding. Continue current medication regimen with Voltaren Gel. 06/10/2021. 4. Bilateral  Ankle Pain:  No complaints today. Continue HEP as tolerated. Continue to monitor.06/10/2021. 5. Muscle Spasm: Continue current medication regimen with  Robaxin. Continue to Monitor.06/10/2021.. 6. Opioid Induced Constipation: No complaints Continue to Monitor. 06/10/2021. 7. Midline Low Back Pain: Continue current medication regime, continue HEP as tolerated. 06/10/2021. 8. Right Gluteal Medius Weakness and Atrophy: No complaints Today:Continue with Hip Abduction Exercises as tolerated: Continue to Monitor. 06/10/2021. 9. Right Hip Pain/ Right Greater Trochanteric Tenderness: No complaints today. Continue to Alternate Ice and Heat Therapy. Continue to Monitor. 06/10/2021  10. Sacroiliac Joint Pain:  S/P Sacroiliac Injection with good relief noted. 06/10/2021 11. Lumbar Radiculitis: No complaints today.Continue Gabapentin. Continue HEP as tolerated. Continue current medication regimen. Continue to monitor.  06/10/2021.     F/U in 1 month

## 2021-06-11 ENCOUNTER — Ambulatory Visit: Payer: Medicare Other | Admitting: Physical Medicine & Rehabilitation

## 2021-06-19 LAB — TOXASSURE SELECT,+ANTIDEPR,UR

## 2021-06-22 ENCOUNTER — Telehealth: Payer: Self-pay | Admitting: *Deleted

## 2021-06-22 NOTE — Telephone Encounter (Signed)
Urine drug screen for this encounter is consistent for prescribed medication 

## 2021-07-12 ENCOUNTER — Encounter: Payer: Medicare Other | Attending: Physical Medicine & Rehabilitation | Admitting: Registered Nurse

## 2021-07-12 ENCOUNTER — Other Ambulatory Visit: Payer: Self-pay

## 2021-07-12 ENCOUNTER — Encounter: Payer: Self-pay | Admitting: Registered Nurse

## 2021-07-12 VITALS — BP 129/74 | HR 60 | Ht 65.0 in | Wt 210.2 lb

## 2021-07-12 DIAGNOSIS — G8929 Other chronic pain: Secondary | ICD-10-CM | POA: Insufficient documentation

## 2021-07-12 DIAGNOSIS — M170 Bilateral primary osteoarthritis of knee: Secondary | ICD-10-CM | POA: Diagnosis not present

## 2021-07-12 DIAGNOSIS — Z79891 Long term (current) use of opiate analgesic: Secondary | ICD-10-CM | POA: Insufficient documentation

## 2021-07-12 DIAGNOSIS — Z5181 Encounter for therapeutic drug level monitoring: Secondary | ICD-10-CM | POA: Diagnosis not present

## 2021-07-12 DIAGNOSIS — M546 Pain in thoracic spine: Secondary | ICD-10-CM | POA: Insufficient documentation

## 2021-07-12 DIAGNOSIS — M1712 Unilateral primary osteoarthritis, left knee: Secondary | ICD-10-CM | POA: Insufficient documentation

## 2021-07-12 DIAGNOSIS — G894 Chronic pain syndrome: Secondary | ICD-10-CM | POA: Diagnosis not present

## 2021-07-12 DIAGNOSIS — M62830 Muscle spasm of back: Secondary | ICD-10-CM | POA: Insufficient documentation

## 2021-07-12 MED ORDER — MORPHINE SULFATE ER 15 MG PO TBCR: 15.0000 mg | EXTENDED_RELEASE_TABLET | Freq: Three times a day (TID) | ORAL | 0 refills | Status: DC

## 2021-07-12 NOTE — Progress Notes (Signed)
Subjective:    Patient ID: [name], female    DOB: July 19, 1950, 71 y.o.   MRN: [id number]  HPI: [name] is a 71 y.o. female who returns for follow up appointment for chronic pain and medication refill. She states her pain is located in her mid- back. . She rates her pain 4. Her current exercise regime is walking and performing stretching exercises.  Ms. Derringer Morphine equivalent is 43.50 MME.   Last UDS was Performed on 06/10/2021, it was consistent.     Pain Inventory Average Pain 4 Pain Right Now 4 My pain is constant, burning, and aching  In the last 24 hours, has pain interfered with the following? General activity 4 Relation with others 4 Enjoyment of life 4 What TIME of day is your pain at its worst? evening and night Sleep (in general) Good  Pain is worse with: walking, bending, standing, and some activites Pain improves with: rest, heat/ice, and medication Relief from Meds: 6  Family History  Problem Relation Age of Onset   Breast cancer Mother    Diabetes Mother    Heart disease Mother    Allergies Mother    Diabetes Father    Heart disease Father    Emphysema Father    Allergies Father    Stomach cancer Maternal Uncle    Breast cancer Paternal Aunt    Colon cancer Neg Hx    Social History   Socioeconomic History   Marital status: Married    Spouse name: Not on file   Number of children: Not on file   Years of education: Not on file   Highest education level: Not on file  Occupational History   Occupation: Radiation protection practitioner   Occupation: retired  Tobacco Use   Smoking status: Never   Smokeless tobacco: Never  Vaping Use   Vaping Use: Never used  Substance and Sexual Activity   Alcohol use: Yes    Alcohol/week: 1.0 standard drink    Types: 1 Glasses of wine per week    Comment: rare   Drug use: Never   Sexual activity: Not Currently  Other Topics Concern   Not on file  Social History Narrative   Not on file   Social Determinants of  Health   Financial Resource Strain: Low Risk    Difficulty of Paying Living Expenses: Not hard at all  Food Insecurity: No Food Insecurity   Worried About Charity fundraiser in the Last Year: Never true   [place] in the Last Year: Never true  Transportation Needs: No Transportation Needs   Lack of Transportation (Medical): No   Lack of Transportation (Non-Medical): No  Physical Activity: Sufficiently Active   Days of Exercise per Week: 7 days   Minutes of Exercise per Session: 30 min  Stress: No Stress Concern Present   Feeling of Stress : Not at all  Social Connections: Socially Integrated   Frequency of Communication with Friends and Family: Three times a week   Frequency of Social Gatherings with Friends and Family: More than three times a week   Attends Religious Services: More than 4 times per year   Active Member of Clubs or Organizations: Yes   Attends Archivist Meetings: More than 4 times per year   Marital Status: Married   Past Surgical History:  Procedure Laterality Date   24 HOUR [place] STUDY N/A 01/04/2016   Procedure: 24 HOUR [place] STUDY;  Surgeon: Renelda Loma Armbruster,  MD;  Location: [facility];  Service: Gastroenterology;  Laterality: N/A;   ABDOMINAL HYSTERECTOMY  1988   ADENOIDECTOMY     ANTERIOR CERVICAL DECOMP/DISCECTOMY FUSION  2009, 1993   first procedure in ~ 1993/Dr [place] N/A 04/03/2019   Procedure: CARDIOVERSION;  Surgeon: Dorothy Spark, MD;  Location: [place];  Service: Cardiovascular;  Laterality: N/A;   CHOLECYSTECTOMY  2005   ESOPHAGEAL MANOMETRY N/A 01/04/2016   Procedure: ESOPHAGEAL MANOMETRY (EM) 24 HR PH;  Surgeon: Manus Gunning, MD;  Location: [facility];  Service: Gastroenterology;  Laterality: N/A;   ESOPHAGOGASTRODUODENOSCOPY (EGD) WITH PROPOFOL N/A 05/17/2016   Procedure: ESOPHAGOGASTRODUODENOSCOPY (EGD) WITH PROPOFOL;  Surgeon: Manus Gunning,  MD;  Location: [facility];  Service: Gastroenterology;  Laterality: N/A;   NASAL SINUS SURGERY  2006   SAVORY DILATION N/A 05/17/2016   Procedure: SAVORY DILATION;  Surgeon: Manus Gunning, MD;  Location: [facility];  Service: Gastroenterology;  Laterality: N/A;   TEE WITHOUT CARDIOVERSION N/A 04/03/2019   Procedure: TRANSESOPHAGEAL ECHOCARDIOGRAM (TEE)-with DCCV;  Surgeon: Dorothy Spark, MD;  Location: [facility] END[facility];; EF 55 to 60%.  No LVH.  Normal RV.  Moderate LA dilation.  No thrombus.  Mild RA dilation.  Mild to moderate TR.  Mild aortic valve sclerosis, no stenosis.  Moderately elevated PA pressures (47 mmHg)   TONSILLECTOMY AND ADENOIDECTOMY     TRANSTHORACIC ECHOCARDIOGRAM  2009   Normal LV size and function. EF 60-65%. No regional wall motion and amount is. Mild aortic sclerosis, no stenosis   TUBAL LIGATION  1979   Past Surgical History:  Procedure Laterality Date   53 HOUR [place] STUDY N/A 01/04/2016   Procedure: 24 HOUR [place] STUDY;  Surgeon: Manus Gunning, MD;  Location: [facility];  Service: Gastroenterology;  Laterality: N/A;   ABDOMINAL HYSTERECTOMY  1988   ADENOIDECTOMY     ANTERIOR CERVICAL DECOMP/DISCECTOMY FUSION  2009, 1993   first procedure in ~ 1993/Dr [place] N/A 04/03/2019   Procedure: CARDIOVERSION;  Surgeon: Dorothy Spark, MD;  Location: [place];  Service: Cardiovascular;  Laterality: N/A;   CHOLECYSTECTOMY  2005   ESOPHAGEAL MANOMETRY N/A 01/04/2016   Procedure: ESOPHAGEAL MANOMETRY (EM) 24 HR PH;  Surgeon: Manus Gunning, MD;  Location: [facility];  Service: Gastroenterology;  Laterality: N/A;   ESOPHAGOGASTRODUODENOSCOPY (EGD) WITH PROPOFOL N/A 05/17/2016   Procedure: ESOPHAGOGASTRODUODENOSCOPY (EGD) WITH PROPOFOL;  Surgeon: Manus Gunning, MD;  Location: [facility];  Service: Gastroenterology;  Laterality: N/A;   NASAL SINUS SURGERY  2006   SAVORY DILATION N/A  05/17/2016   Procedure: SAVORY DILATION;  Surgeon: Manus Gunning, MD;  Location: [facility];  Service: Gastroenterology;  Laterality: N/A;   TEE WITHOUT CARDIOVERSION N/A 04/03/2019   Procedure: TRANSESOPHAGEAL ECHOCARDIOGRAM (TEE)-with DCCV;  Surgeon: Dorothy Spark, MD;  Location: [facility] ENDOSCOPY;; EF 55 to 60%.  No LVH.  Normal RV.  Moderate LA dilation.  No thrombus.  Mild RA dilation.  Mild to moderate TR.  Mild aortic valve sclerosis, no stenosis.  Moderately elevated PA pressures (47 mmHg)   TONSILLECTOMY AND ADENOIDECTOMY     TRANSTHORACIC ECHOCARDIOGRAM  2009   Normal LV size and function. EF 60-65%. No regional wall motion and amount is. Mild aortic sclerosis, no stenosis   TUBAL LIGATION  1979   Past Medical History:  Diagnosis Date  B12 deficiency    Complication of anesthesia    slow to wake up    Depression    Diabetes mellitus type 2, diet-controlled ([place]) 07/24/2018   Fibromyalgia    Generalized hyperhidrosis 04/17/2014   GERD (gastroesophageal reflux disease)    History of rheumatic fever as a child    Hyperlipemia    Hypertension    denies htn on 05/09/16   HYPOTHYROIDISM 05/29/2007   Irritable bowel syndrome    Migraines    hx of    OSA (obstructive sleep apnea) 06/26/2019   Senile calcific aortic valve sclerosis    Aortic Sclerosis-no stenosis   Ht 5\' 5"  (1.651 m)    Wt 210 lb 3.2 oz (95.3 kg)    BMI 34.98 kg/m   Opioid Risk Score:   Fall Risk Score:  `1  Depression screen PHQ 2/9  Depression screen [facility] 2/9 07/12/2021 06/10/2021 05/31/2021 05/12/2021 04/29/2021 01/26/2021 10/27/2020  Decreased Interest 0 1 0 0 1 1 1   Down, Depressed, Hopeless 1 1 0 0 1 1 1   PHQ - 2 Score 1 2 0 0 2 2 2   Altered sleeping - - - - - - -  Tired, decreased energy - - - - - - -  Change in appetite - - - - - - -  Feeling bad or failure about yourself  - - - - - - -  Trouble concentrating - - - - - - -  Moving slowly or fidgety/restless - - - - - - -  Suicidal thoughts -  - - - - - -  PHQ-9 Score - - - - - - -  Difficult doing work/chores - - - - - - -  Some recent data might be hidden     Review of Systems  Constitutional: Negative.   HENT: Negative.    Eyes: Negative.   Respiratory: Negative.    Cardiovascular: Negative.   Gastrointestinal: Negative.   Endocrine: Negative.   Genitourinary: Negative.   Musculoskeletal:  Positive for back pain.  Skin: Negative.   Allergic/Immunologic: Negative.   Neurological: Negative.   Hematological: Negative.   Psychiatric/Behavioral: Negative.        Objective:   Physical Exam Vitals and nursing note reviewed.  Constitutional:      Appearance: Normal appearance.  Cardiovascular:     Rate and Rhythm: Normal rate and regular rhythm.     Pulses: Normal pulses.     Heart sounds: Normal heart sounds.  Pulmonary:     Effort: Pulmonary effort is normal.     Breath sounds: Normal breath sounds.  Musculoskeletal:     Cervical back: Normal range of motion and neck supple.     Comments: Normal Muscle Bulk and Muscle Testing Reveals:  Upper Extremities: Full ROM and Muscle Strength 5/5 Thoracic Paraspinal Tenderness: T-7-T-9 Lower Extremities: Full ROM and Muscle Strength 5/5 Arises from Table with ease Narrow Based  Gait     Skin:    General: Skin is warm and dry.  Neurological:     Mental Status: She is alert and oriented to person, place, and time.  Psychiatric:        Mood and Affect: Mood normal.        Behavior: Behavior normal.         Assessment & Plan:  1. Cervical postlaminectomy syndrome:/ Cervicalgia/ Cervical Radiculitis/ with neck and shoulder pain/ Chronic Midline Thoracic Pain.  Continue exercise routine and using heat therapy. 07/12/2021 Continue : current medication regimen.  Refilled: MS Contin 15 mg one tablet three times a day #90.   We will continue the opioid monitoring program, this consists of regular clinic visits, examinations, urine drug screen, pill counts as well as use  of [place] Controlled Substance Reporting system. A 12 month History has been reviewed on the [place] on 07/12/2021.   2. Fibromyalgia/ Neuropathy: Continue current medication regimen with Gabapentin, activity and exercise regime. 01/16 /2023. 3. Bilateral Knee Pain/ Mild Degenerative Changes L>R: Scheduled for Left Knee Injection with Dr Letta Pate, she verbalizes understanding. Continue current medication regimen with Voltaren Gel. 07/12/2021. 4. Bilateral  Ankle Pain:  No complaints today. Continue HEP as tolerated. Continue to monitor.07/12/2021. 5. Muscle Spasm: Continue current medication regimen with  Robaxin. Continue to Monitor.07/12/2021.. 6. Opioid Induced Constipation: No complaints Continue to Monitor. 07/12/2021. 7. Midline Low Back Pain: Continue current medication regime, continue HEP as tolerated. 07/12/2021. 8. Right Gluteal Medius Weakness and Atrophy: No complaints Today:Continue with Hip Abduction Exercises as tolerated: Continue to Monitor. 07/12/2021. 9. Right Hip Pain/ Right Greater Trochanteric Tenderness: No complaints today. Continue to Alternate Ice and Heat Therapy. Continue to Monitor. 07/12/2021  10. Sacroiliac Joint Pain:  S/P Sacroiliac Injection with good relief noted. 07/12/2021 11. Lumbar Radiculitis: No complaints today.Continue Gabapentin. Continue HEP as tolerated. Continue current medication regimen. Continue to monitor.  07/12/2021.     F/U in 1 month

## 2021-07-16 ENCOUNTER — Encounter: Payer: Medicare Other | Admitting: Physical Medicine & Rehabilitation

## 2021-07-16 ENCOUNTER — Other Ambulatory Visit: Payer: Self-pay | Admitting: Cardiology

## 2021-07-16 ENCOUNTER — Other Ambulatory Visit: Payer: Self-pay

## 2021-07-16 ENCOUNTER — Encounter: Payer: Self-pay | Admitting: Physical Medicine & Rehabilitation

## 2021-07-16 VITALS — BP 129/77 | HR 63 | Temp 98.6°F | Ht 65.0 in | Wt 209.6 lb

## 2021-07-16 DIAGNOSIS — Z5181 Encounter for therapeutic drug level monitoring: Secondary | ICD-10-CM | POA: Diagnosis not present

## 2021-07-16 DIAGNOSIS — M1712 Unilateral primary osteoarthritis, left knee: Secondary | ICD-10-CM | POA: Diagnosis not present

## 2021-07-16 DIAGNOSIS — M546 Pain in thoracic spine: Secondary | ICD-10-CM | POA: Diagnosis not present

## 2021-07-16 DIAGNOSIS — M62830 Muscle spasm of back: Secondary | ICD-10-CM | POA: Diagnosis not present

## 2021-07-16 DIAGNOSIS — M170 Bilateral primary osteoarthritis of knee: Secondary | ICD-10-CM | POA: Diagnosis not present

## 2021-07-16 DIAGNOSIS — G894 Chronic pain syndrome: Secondary | ICD-10-CM | POA: Diagnosis not present

## 2021-07-16 DIAGNOSIS — Z79891 Long term (current) use of opiate analgesic: Secondary | ICD-10-CM | POA: Diagnosis not present

## 2021-07-16 DIAGNOSIS — G8929 Other chronic pain: Secondary | ICD-10-CM | POA: Diagnosis not present

## 2021-07-16 NOTE — Progress Notes (Signed)
Knee injection Left without [place]  Indication:LEFT Knee pain not relieved by medication management and other conservative care.  Informed consent was obtained after describing risks and benefits of the procedure with the patient, this includes bleeding, bruising, infection and medication side effects. The patient wishes to proceed and has given written consent. The patient was placed in a recumbent position. The medial aspect of the knee was marked and prepped with Betadine and alcohol. It was then entered with a 25-gauge 1-1/2  was inserted into the knee joint. After negative draw back for blood, a solution containing one ML of 6mg  per mL betamethasone and 3 mL of 1% lidocaine were injected. The patient tolerated the procedure well. Post procedure instructions were given.

## 2021-07-16 NOTE — Patient Instructions (Signed)

## 2021-07-19 ENCOUNTER — Other Ambulatory Visit: Payer: Medicare Other

## 2021-07-19 NOTE — Telephone Encounter (Signed)
Prescription refill request for Eliquis received. Indication:Afib Last office visit:11/22 Scr:0.7 Age: 71 Weight:95.1 kg  Prescription refilled

## 2021-07-21 ENCOUNTER — Other Ambulatory Visit: Payer: Self-pay

## 2021-07-21 ENCOUNTER — Encounter: Payer: Self-pay | Admitting: Cardiology

## 2021-07-21 ENCOUNTER — Ambulatory Visit: Payer: Medicare Other | Admitting: Cardiology

## 2021-07-21 VITALS — BP 128/72 | HR 64 | Ht 65.0 in | Wt 206.8 lb

## 2021-07-21 DIAGNOSIS — I10 Essential (primary) hypertension: Secondary | ICD-10-CM

## 2021-07-21 DIAGNOSIS — E785 Hyperlipidemia, unspecified: Secondary | ICD-10-CM

## 2021-07-21 DIAGNOSIS — I480 Paroxysmal atrial fibrillation: Secondary | ICD-10-CM

## 2021-07-21 DIAGNOSIS — E669 Obesity, unspecified: Secondary | ICD-10-CM

## 2021-07-21 NOTE — Patient Instructions (Signed)
Medication Instructions:   NO CHANGES *If you need a refill on your cardiac medications before your next appointment, please call your pharmacy*   Lab Work: NOT NEEDED    Testing/Procedures:  NOT NEEDED  Follow-Up: At [facility], you and your health needs are our priority.  As part of our continuing mission to provide you with exceptional heart care, we have created designated Provider Care Teams.  These Care Teams include your primary Cardiologist (physician) and Advanced Practice Providers (APPs -  Physician Assistants and Nurse Practitioners) who all work together to provide you with the care you need, when you need it.     Your next appointment:   10 TO 11 month(s)  The format for your next appointment:   In Person  Provider:   Glenetta Hew, MD    Other Instructions   PLEASE [place] AT [place] BILLING DEPT

## 2021-07-21 NOTE — Progress Notes (Signed)
Primary Care Provider: Owens Loffler, MD Cardiologist: Glenetta Hew, MD Electrophysiologist: None  Clinic Note: Chief Complaint  Patient presents with   Follow-up    Doing well   Atrial Fibrillation    No breakthrough episodes    ===================================  ASSESSMENT/PLAN   Problem List Items Addressed This Visit       Cardiology Problems   Paroxysmal atrial fibrillation [facility]): CHA2D2DSVasc =4; Eliquis - Primary (Chronic)    Doing well.  No breakthrough episodes.  Plan: Continue standing dose of diltiazem 240 mg daily along with the Kuneff 50 mg twice daily. On Eliquis  Breakthrough A-fib: Flecainide 100 mg twice daily for 2 days, also can take additional diltiazem short acting 30 mg for tachycardia      Relevant Orders   EKG 12-Lead (Completed)   Hyperlipidemia with target LDL less than 100 (Chronic)    Target LDL less than Coronary Calcium Score is less than 100.  Has not had labs checked in a while. => Anticipate labs to be checked by PCP, if not checked we can recheck in follow-up. => Low threshold to consider statin      Essential hypertension (Chronic)    Blood pressure looks pretty well controlled on current dose of diltiazem CD 240 mg daily.        Other   Obesity (BMI 30-39.9) (Chronic)    The patient understands the need to lose weight with diet and exercise. We have discussed specific strategies for this.      ===================================  HPI:    [name] is a 71 y.o. female with a PMH notable for fibromyalgia, DM-2, and A Flutter/PAF (diagnosed October 2020), HTN, OSA-BiPAP who presents today for 74-month follow-up.  Dx of AFib (Oct 5-8, 2020 - 9 d Hx of palpitations -> Afib 150s -> ER ? Aflutter/Fib -- IV Dilt TEE/DCCV 10/7; d/c on Eliquis & Cardizem CD 240 mg.  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 Cor Ca Score 63.   Recurrent A. fib March 2021 (was 2 weeks after COVID-19 infection)  => Was started on  Flecainide by Afib Clinic --> CHA2DS2Vasc = 4-on Eliquis.  Cancelled DCCV 09/09/2019 - was in NSR GXT on Flecainide 09/25/2019: 3:37 min, 5.3 METS. HR 88% MPHR  [name] was last seen by me on June 02, 2020 -> doing well.  Pending OSA evaluation.  She has had 1 short episode of A-fib in the setting of GI bug.  Resolved and GI symptoms resolved. => Discussed pill in the pocket breakthrough technique for 100 mg twice daily x3 days flecainide for breakthrough A-fib.  Last seen in the A. fib clinic by Mr. Fenton, PA on 04/27/2021 0-> doing well since DCCV.  Had 1 episode in early October 2022 which resolved after extra flecainide dose.  She had a high intensity 1 out of diltiazem just prior to going into A. fib.  No bleeding issues on anticoagulation. Flecainide 50 mg twice daily, diltiazem XT 240 mg daily, Eliquis 5 mg twice daily Consider adding low-dose rosuvastatin for hyperlipidemia.  Recent Hospitalizations: N/A  Reviewed  CV studies:    The following studies were reviewed today: (if available, images/films reviewed: From Epic Chart or Care Everywhere) NA:   Interval History:   [name] returns today overall doing fairly well.  No further breakthrough episodes of A-fib.  Her only major issue was that she got a letter from her insurance company saying that Mr. Fenton was no longer covered by her [employer].  We did reassure her that this was probably a clerical error which we can probably rectify with a letter.  She otherwise is doing well no major complaints.  No recurrent palpitations.  No CHF symptoms of PND, orthopnea or edema.  No chest pain or pressure with rest or exertion.  CV Review of Symptoms (Summary) Cardiovascular ROS: no chest pain or dyspnea on exertion positive for - -just mild baseline dyspnea because of obesity.  But doing well. negative for - chest pain, edema, irregular heartbeat, orthopnea, palpitations, paroxysmal nocturnal dyspnea, rapid heart  rate, shortness of breath, or lightheadedness or dizziness.  Syncope/near syncope or TIA/amaurosis fugax, claudication  REVIEWED OF SYSTEMS   Review of Systems  Constitutional:  Negative for malaise/fatigue (Somewhat sedentary.) and weight loss.  HENT:  Negative for nosebleeds.   Respiratory:  Negative for cough and shortness of breath.   Gastrointestinal:  Negative for blood in stool.  Genitourinary:  Negative for hematuria.  Musculoskeletal:  Negative for falls, joint pain and myalgias.  Neurological:  Negative for dizziness, focal weakness and weakness.  Endo/Heme/Allergies:  Does not bruise/bleed easily.  Psychiatric/Behavioral: Negative.     I have reviewed and (if needed) personally updated the patient's problem list, medications, allergies, past medical and surgical history, social and family history.   PAST MEDICAL HISTORY   Past Medical History:  Diagnosis Date   B12 deficiency    Complication of anesthesia    slow to wake up    Depression    Diabetes mellitus type 2, diet-controlled ([place]) 07/24/2018   Fibromyalgia    Generalized hyperhidrosis 04/17/2014   GERD (gastroesophageal reflux disease)    History of rheumatic fever as a child    Hyperlipemia    Hypertension    denies htn on 05/09/16   HYPOTHYROIDISM 05/29/2007   Irritable bowel syndrome    Migraines    hx of    OSA (obstructive sleep apnea) 06/26/2019   Senile calcific aortic valve sclerosis    Aortic Sclerosis-no stenosis    PAST SURGICAL HISTORY   Past Surgical History:  Procedure Laterality Date   9 HOUR [place] STUDY N/A 01/04/2016   Procedure: 24 HOUR [place] STUDY;  Surgeon: Manus Gunning, MD;  Location: [facility];  Service: Gastroenterology;  Laterality: N/A;   ABDOMINAL HYSTERECTOMY  1988   ADENOIDECTOMY     ANTERIOR CERVICAL DECOMP/DISCECTOMY FUSION  2009, 1993   first procedure in ~ 1993/Dr [place] N/A 04/03/2019   Procedure:  CARDIOVERSION;  Surgeon: Dorothy Spark, MD;  Location: [facility];  Service: Cardiovascular;  Laterality: N/A;   CHOLECYSTECTOMY  2005   ESOPHAGEAL MANOMETRY N/A 01/04/2016   Procedure: ESOPHAGEAL MANOMETRY (EM) 24 HR PH;  Surgeon: Manus Gunning, MD;  Location: [facility];  Service: Gastroenterology;  Laterality: N/A;   ESOPHAGOGASTRODUODENOSCOPY (EGD) WITH PROPOFOL N/A 05/17/2016   Procedure: ESOPHAGOGASTRODUODENOSCOPY (EGD) WITH PROPOFOL;  Surgeon: Manus Gunning, MD;  Location: [facility];  Service: Gastroenterology;  Laterality: N/A;   NASAL SINUS SURGERY  2006   SAVORY DILATION N/A 05/17/2016   Procedure: SAVORY DILATION;  Surgeon: Manus Gunning, MD;  Location: [facility];  Service: Gastroenterology;  Laterality: N/A;   TEE WITHOUT CARDIOVERSION N/A 04/03/2019   Procedure: TRANSESOPHAGEAL ECHOCARDIOGRAM (TEE)-with DCCV;  Surgeon: Dorothy Spark, MD;  Location: [facility];; EF 55 to 60%.  No LVH.  Normal RV.  Moderate LA dilation.  No thrombus.  Mild RA dilation.  Mild to moderate TR.  Mild aortic valve sclerosis, no stenosis.  Moderately elevated PA pressures (47 mmHg)   TONSILLECTOMY AND ADENOIDECTOMY     TRANSTHORACIC ECHOCARDIOGRAM  2009   Normal LV size and function. EF 60-65%. No regional wall motion and amount is. Mild aortic sclerosis, no stenosis   TUBAL LIGATION  1979    Immunization History  Administered Date(s) Administered   PFIZER(Purple Top)SARS-COV-2 Vaccination 08/22/2019, 09/18/2019   Pneumococcal Conjugate-13 01/02/2017   Pneumococcal Polysaccharide-23 05/06/2013, 07/25/2018   Td 06/28/2003, 10/16/2013   Zoster, Live 11/07/2012    MEDICATIONS/ALLERGIES   Current Meds  Medication Sig   acetaminophen (TYLENOL) 650 MG CR tablet Take 650 mg by mouth in the morning and at bedtime.   Ascorbic Acid (VITAMIN C) 1000 MG tablet Take 500 mg by mouth daily. 1 Tablet Daily   B Complex Vitamins (B COMPLEX 100 PO) Take 1 tablet by  mouth every morning.    Calcium Carbonate-Vitamin D (CALCIUM 600+D PO) Take 1 tablet by mouth in the morning and at bedtime.    carboxymethylcellulose (REFRESH PLUS) 0.5 % SOLN Place 1 drop into both eyes 3 (three) times daily as needed (dry eyes).    cetirizine (ZYRTEC) 10 MG tablet Take 10 mg by mouth at bedtime.    Cholecalciferol (VITAMIN D) 125 MCG (5000 UT) CAPS Take 5,000 Units by mouth every morning.    clindamycin (CLEOCIN T) 1 % lotion Apply 1 application topically daily as needed (eczema).    Coenzyme Q-10 100 MG capsule Take 100 mg by mouth every morning.    diclofenac sodium (VOLTAREN) 1 % GEL Apply 4 g topically 4 (four) times daily as needed (joint pain).    diltiazem (CARDIZEM CD) 240 MG 24 hr capsule TAKE 1 CAPSULE BY MOUTH EVERY DAY   diltiazem (CARDIZEM) 30 MG tablet Take one tablet by mouth every 4 hours as needed for heart rate greater than 100, top number blood pressure needs to be above 100   docusate sodium (COLACE) 250 MG capsule Take 250 mg by mouth at bedtime.   ELIQUIS 5 MG TABS tablet TAKE 1 TABLET BY MOUTH  TWICE DAILY   Flaxseed, Linseed, (FLAXSEED OIL) 1000 MG CAPS Take 1,000 mg by mouth every other day. Take 1 capsule every other day.   flecainide (TAMBOCOR) 50 MG tablet TAKE 1 TABLET BY MOUTH  TWICE DAILY   fluocinonide (LIDEX) 0.05 % external solution Apply 1 application topically daily as needed (eczema on scalp).    gabapentin (NEURONTIN) 300 MG capsule TAKE 1 CAPSULE BY MOUTH 3  TIMES DAILY   GRAPE SEED EXTRACT PO Take 100 mg by mouth 2 (two) times daily.    levothyroxine (SYNTHROID) 50 MCG tablet TAKE 1 TABLET BY MOUTH  DAILY   loratadine (CLARITIN) 10 MG tablet Take 10 mg by mouth every morning.    Magnesium Bisglycinate (MAG GLYCINATE PO) Take 400 mg by mouth at bedtime.    methocarbamol (ROBAXIN) 500 MG tablet TAKE 1 TABLET BY MOUTH EVERY 8 HOURS AS NEEDED FOR MUSCLE SPASMS.   mirtazapine (REMERON) 15 MG tablet TAKE 1 TABLET BY MOUTH AT  BEDTIME    morphine (MS CONTIN) 15 MG 12 hr tablet Take 1 tablet (15 mg total) by mouth 3 (three) times daily.   neomycin-polymyxin b-dexamethasone (MAXITROL) 3.5-10000-0.1 OINT SMARTSIG:Sparingly In Eye(s)   oxybutynin (DITROPAN-XL) 10 MG 24 hr tablet Take 10 mg by mouth at bedtime.   oxymetazoline (AFRIN) 0.05 % nasal spray Place 1 spray into both nostrils  2 (two) times daily as needed for congestion.   RABEprazole (ACIPHEX) 20 MG tablet TAKE 1 TABLET BY MOUTH EVERY DAY   senna (SENOKOT) 8.6 MG TABS tablet Take 4 tablets by mouth at bedtime.   tretinoin (RETIN-A) 0.05 % cream Apply 1 application topically daily as needed (acne).     Allergies  Allergen Reactions   Cyclobenzaprine Hcl Anaphylaxis and Swelling    Reaction - throat closed, tongue swelling   Rizatriptan Benzoate Anaphylaxis    Throat and Tongue Swelling Closed   Sumatriptan Swelling    Throat closed & Tongue Swelling   Tizanidine Shortness Of Breath   Aleve [Naproxen Sodium] Hives   Clarithromycin Hives   Doxycycline Diarrhea and Nausea And Vomiting   Duloxetine Nausea Only   Egg [Eggs Or Egg-Derived Products] Diarrhea and Nausea And Vomiting   Keflex [Cephalexin] Itching and Other (See Comments)    Sores   Metronidazole Diarrhea and Nausea Only   Oxycodone Hcl Diarrhea and Nausea And Vomiting   Oxycontin [Oxycodone Hcl] Nausea And Vomiting   Penicillins Other (See Comments)    Unknown reaction Did it involve swelling of the face/tongue/throat, SOB, or low BP? Unknown Did it involve sudden or severe rash/hives, skin peeling, or any reaction on the inside of your mouth or nose? Unknown Did you need to seek medical attention at a hospital or doctor's office? Unknown When did it last happen?   infant   If all above answers are NO, may proceed with cephalosporin use.   Rosuvastatin Other (See Comments)    Muscle aches   Venlafaxine Hives   Voltaren [Diclofenac Sodium] Hives    Reaction to oral med, uses gel occasionally    Septra [Sulfamethoxazole-Trimethoprim] Itching and Rash   Tramadol Diarrhea, Nausea And Vomiting and Palpitations    SOCIAL HISTORY/FAMILY HISTORY   Reviewed in Epic:  Pertinent findings:  Social History   Tobacco Use   Smoking status: Never   Smokeless tobacco: Never  Vaping Use   Vaping Use: Never used  Substance Use Topics   Alcohol use: Yes    Alcohol/week: 1.0 standard drink    Types: 1 Glasses of wine per week    Comment: rare   Drug use: Never   Social History   Social History Narrative   Not on file    OBJCTIVE -PE, EKG, labs   Wt Readings from Last 3 Encounters:  08/23/21 205 lb (93 kg)  08/23/21 202 lb (91.6 kg)  08/10/21 210 lb (95.3 kg)    Physical Exam: BP 128/72    Pulse 64    Ht 5\' 5"  (1.651 m)    Wt 206 lb 12.8 oz (93.8 kg)    SpO2 95%    BMI 34.41 kg/m  Physical Exam Constitutional:      General: She is not in acute distress.    Appearance: She is not ill-appearing or toxic-appearing.     Comments: Mildly obese.  Well-groomed.  Well-nourished.  HENT:     Head: Normocephalic and atraumatic.  Neck:     Vascular: No carotid bruit or JVD.  Cardiovascular:     Rate and Rhythm: Normal rate and regular rhythm. No extrasystoles are present.    Pulses: Normal pulses and intact distal pulses.     Heart sounds: S1 normal and S2 normal. Heart sounds are distant. Murmur heard.  Harsh crescendo-decrescendo midsystolic murmur is present with a grade of 1/6 at the upper right sternal border radiating to the neck.    No  friction rub. No gallop.  Pulmonary:     Effort: Pulmonary effort is normal. No respiratory distress.     Breath sounds: Normal breath sounds. No wheezing, rhonchi or rales.  Chest:     Chest wall: No tenderness.  Musculoskeletal:        General: Swelling (Trace bilateral lower extremity swelling.) present. Normal range of motion.     Cervical back: Normal range of motion and neck supple.  Skin:    General: Skin is warm and dry.   Neurological:     General: No focal deficit present.     Mental Status: She is alert and oriented to person, place, and time.  Psychiatric:        Mood and Affect: Mood normal.        Behavior: Behavior normal.        Thought Content: Thought content normal.        Judgment: Judgment normal.    Adult ECG Report  Rate: 64 ;  Rhythm: normal sinus rhythm and normal axis, intervals and durations. ;   Narrative Interpretation: Stable  Recent Labs: Reviewed 03/12/2020: TC 188, TG 142, HDL 50.1, LDL 108;  12/21/2020: A1c 7.0. 09/14/2020: Hgb 12.5, Cr 0.77, K+ 4.3; TSH 3.52. Lab Results  Component Value Date   CHOL 188 03/12/2020   HDL 50.90 03/12/2020   LDLCALC 108 (H) 03/12/2020   LDLDIRECT 125.0 01/18/2019   TRIG 142.0 03/12/2020   CHOLHDL 4 03/12/2020   Lab Results  Component Value Date   CREATININE 0.77 09/14/2020   BUN 15 09/14/2020   NA 139 09/14/2020   K 4.3 09/14/2020   CL 99 09/14/2020   CO2 32 09/14/2020   CBC Latest Ref Rng & Units 09/14/2020 03/12/2020 08/29/2019  WBC 4.0 - 10.5 K/uL 6.2 6.1 6.0  Hemoglobin 12.0 - 15.0 g/dL 12.5 12.1 13.0  Hematocrit 36.0 - 46.0 % 36.2 35.9(L) 39.1  Platelets 150.0 - 400.0 K/uL 264.0 236.0 262    Lab Results  Component Value Date   HGBA1C 7.0 (A) 12/21/2020   Lab Results  Component Value Date   TSH 3.52 09/14/2020    ==================================================  COVID-19 Education: The signs and symptoms of COVID-19 were discussed with the patient and how to seek care for testing (follow up with PCP or arrange E-visit).    I spent a total of 21 minutes with the patient spent in direct patient consultation.  Additional time spent with chart review  / charting (studies, outside notes, etc): 12 min Total Time: 33 min  Current medicines are reviewed at length with the patient today.  (+/- concerns) N/A  This visit occurred during the SARS-CoV-2 public health emergency.  Safety protocols were in place, including  screening questions prior to the visit, additional usage of staff PPE, and extensive cleaning of exam room while observing appropriate contact time as indicated for disinfecting solutions.  Notice: This dictation was prepared with Dragon dictation along with smart phrase technology. Any transcriptional errors that result from this process are unintentional and may not be corrected upon review.  Studies Ordered:   Orders Placed This Encounter  Procedures   EKG 12-Lead    Patient Instructions / Medication Changes & Studies & Tests Ordered   Patient Instructions  Medication Instructions:   NO CHANGES *If you need a refill on your cardiac medications before your next appointment, please call your pharmacy*   Lab Work: NOT NEEDED    Testing/Procedures:  NOT NEEDED  Follow-Up: At [facility], you  and your health needs are our priority.  As part of our continuing mission to provide you with exceptional heart care, we have created designated Provider Care Teams.  These Care Teams include your primary Cardiologist (physician) and Advanced Practice Providers (APPs -  Physician Assistants and Nurse Practitioners) who all work together to provide you with the care you need, when you need it.     Your next appointment:   10 TO 11 month(s)  The format for your next appointment:   In Person  Provider:   Glenetta Hew, MD    Other Instructions   PLEASE SEND A COPY OF LETTER TO HEARTCARE ABOUT  BEING SEEN AT AFIB CLINIC- WILL BE SENT TO BILLING DEPT      Glenetta Hew, M.D., M.S. Interventional Cardiologist   Pager # [phone] Phone # [phone] [address] [place], [place] [address]   Thank you for choosing Heartcare at [facility]!!

## 2021-07-26 ENCOUNTER — Encounter: Payer: Medicare Other | Admitting: Family Medicine

## 2021-08-03 ENCOUNTER — Other Ambulatory Visit (HOSPITAL_COMMUNITY): Payer: Self-pay | Admitting: Cardiology

## 2021-08-03 ENCOUNTER — Ambulatory Visit: Payer: Medicare Other | Admitting: Physical Medicine & Rehabilitation

## 2021-08-06 ENCOUNTER — Encounter: Payer: Self-pay | Admitting: Cardiology

## 2021-08-10 ENCOUNTER — Emergency Department (HOSPITAL_COMMUNITY)
Admission: EM | Admit: 2021-08-10 | Discharge: 2021-08-10 | Disposition: A | Discharge status: Home / Self Care | Payer: Medicare Other | Attending: Student | Admitting: Student

## 2021-08-10 ENCOUNTER — Telehealth: Payer: Self-pay | Admitting: Registered Nurse

## 2021-08-10 ENCOUNTER — Encounter (HOSPITAL_COMMUNITY): Payer: Self-pay

## 2021-08-10 ENCOUNTER — Emergency Department (HOSPITAL_COMMUNITY): Payer: Medicare Other

## 2021-08-10 ENCOUNTER — Telehealth: Payer: Self-pay

## 2021-08-10 ENCOUNTER — Other Ambulatory Visit: Payer: Self-pay

## 2021-08-10 DIAGNOSIS — K219 Gastro-esophageal reflux disease without esophagitis: Secondary | ICD-10-CM | POA: Insufficient documentation

## 2021-08-10 DIAGNOSIS — K529 Noninfective gastroenteritis and colitis, unspecified: Secondary | ICD-10-CM | POA: Diagnosis not present

## 2021-08-10 DIAGNOSIS — R1032 Left lower quadrant pain: Secondary | ICD-10-CM | POA: Diagnosis present

## 2021-08-10 DIAGNOSIS — E119 Type 2 diabetes mellitus without complications: Secondary | ICD-10-CM | POA: Insufficient documentation

## 2021-08-10 DIAGNOSIS — R109 Unspecified abdominal pain: Secondary | ICD-10-CM | POA: Diagnosis not present

## 2021-08-10 DIAGNOSIS — Z79899 Other long term (current) drug therapy: Secondary | ICD-10-CM | POA: Insufficient documentation

## 2021-08-10 DIAGNOSIS — E039 Hypothyroidism, unspecified: Secondary | ICD-10-CM | POA: Insufficient documentation

## 2021-08-10 DIAGNOSIS — K625 Hemorrhage of anus and rectum: Secondary | ICD-10-CM | POA: Insufficient documentation

## 2021-08-10 DIAGNOSIS — I10 Essential (primary) hypertension: Secondary | ICD-10-CM | POA: Insufficient documentation

## 2021-08-10 DIAGNOSIS — I4891 Unspecified atrial fibrillation: Secondary | ICD-10-CM | POA: Diagnosis not present

## 2021-08-10 DIAGNOSIS — Z7901 Long term (current) use of anticoagulants: Secondary | ICD-10-CM | POA: Diagnosis not present

## 2021-08-10 LAB — COMPREHENSIVE METABOLIC PANEL
ALT: 31 U/L (ref 0–44)
AST: 29 U/L (ref 15–41)
Albumin: 4.8 g/dL (ref 3.5–5.0)
Alkaline Phosphatase: 126 U/L (ref 38–126)
Anion gap: 10 (ref 5–15)
BUN: 14 mg/dL (ref 8–23)
CO2: 28 mmol/L (ref 22–32)
Calcium: 9.8 mg/dL (ref 8.9–10.3)
Chloride: 100 mmol/L (ref 98–111)
Creatinine, Ser: 0.82 mg/dL (ref 0.44–1.00)
GFR, Estimated: >60 mL/min (ref >60)
Glucose, Bld: 158 mg/dL — ABNORMAL HIGH (ref 70–99)
Potassium: 3.9 mmol/L (ref 3.5–5.1)
Sodium: 138 mmol/L (ref 135–145)
Total Bilirubin: 0.5 mg/dL (ref 0.3–1.2)
Total Protein: 8.2 g/dL — ABNORMAL HIGH (ref 6.5–8.1)

## 2021-08-10 LAB — CBC
HCT: 42.7 % (ref 36.0–46.0)
Hemoglobin: 14.6 g/dL (ref 12.0–15.0)
MCH: 30.4 pg (ref 26.0–34.0)
MCHC: 34.2 g/dL (ref 30.0–36.0)
MCV: 88.8 fL (ref 80.0–100.0)
Platelets: 309 10*3/uL (ref 150–400)
RBC: 4.81 MIL/uL (ref 3.87–5.11)
RDW: 11.8 % (ref 11.5–15.5)
WBC: 12 10*3/uL — ABNORMAL HIGH (ref 4.0–10.5)
nRBC: 0 % (ref 0.0–0.2)

## 2021-08-10 LAB — TYPE AND SCREEN
ABO/RH(D): O POS
Antibody Screen: NEGATIVE

## 2021-08-10 MED ORDER — DICYCLOMINE HCL 10 MG PO CAPS
20.0000 mg | ORAL_CAPSULE | Freq: Once | ORAL | Status: AC
  Administered 2021-08-10: 20 mg via ORAL
  Filled 2021-08-10: qty 2

## 2021-08-10 MED ORDER — MORPHINE SULFATE ER 15 MG PO TBCR: 15.0000 mg | EXTENDED_RELEASE_TABLET | Freq: Three times a day (TID) | ORAL | 0 refills | Status: DC

## 2021-08-10 MED ORDER — IOHEXOL 300 MG/ML  SOLN
100.0000 mL | Freq: Once | INTRAMUSCULAR | Status: AC | PRN
  Administered 2021-08-10: 100 mL via INTRAVENOUS

## 2021-08-10 MED ORDER — DICYCLOMINE HCL 20 MG PO TABS: 20.0000 mg | ORAL_TABLET | Freq: Two times a day (BID) | ORAL | 0 refills | Status: DC

## 2021-08-10 MED ORDER — SODIUM CHLORIDE (PF) 0.9 % IJ SOLN
INTRAMUSCULAR | Status: AC
  Filled 2021-08-10: qty 50

## 2021-08-10 NOTE — Telephone Encounter (Signed)
PMP was Reviewed.  MS Contin E-scribed today.  [name] was in the ED today, her appointment will be changed  due to the above.  She is aware via My-Chart Message.

## 2021-08-10 NOTE — Telephone Encounter (Signed)
[facility] - Client TELEPHONE ADVICE RECORD [facility]urse Patient Name: [name] [facility] SON Gender: Female DOB: 08-04-1950 Age: 71 Y 31 M 6 D Return Phone Number: [phone] (Primary), [phone] (Secondary) Address: City/ State/ Zip: [place] [address] Client [facility] - Client Client Site [place] - Day Provider Owens Loffler - MD Contact Type Call Who Is Calling Patient / Member / Family / Caregiver Call Type Triage / Clinical Relationship To Patient Self Return Phone Number [phone] (Primary) Chief Complaint Rectal Bleeding Reason for Call Symptomatic / Request for [place] states had diarrhea yesterday 3pm; nothing left in her but having rectal bleeding since 7pm last night; bright red; a couple of tablespoons at a time; abd pain; Translation No Nurse Assessment Nurse: Gildardo Pounds, RN, Amy Date/Time ([place] Time): 08/10/2021 8:22:26 AM Confirm and document reason for call. If symptomatic, describe symptoms. ---Caller states she started having diarrhea yesterday at 3PM. There is nothing left in her, but she is having rectal bleeding that started at 7PM last night. It is bright red, a couple of tablespoons at a time. No stool, just the blood. She is also having abdominal pain. It is coming & going. It is severe at times. If she does not get up right when the cramping starts to go to the bathroom, it gets worse. She has taken OTC antidiarrheal, but it is not helping with the cramping. Does the patient have any new or worsening symptoms? ---Yes Will a triage be completed? ---Yes Related visit to physician within the last 2 weeks? ---No Does the PT have any chronic conditions? (i.e. diabetes, asthma, this includes High risk factors for pregnancy, etc.) ---Yes List chronic conditions. ---A-Fib-on Eliquis, DM2, Fibromyalgia Is this a behavioral health  or substance abuse call? ---No Guidelines Guideline Title Affirmed Question Affirmed Notes Nurse Date/Time (Eastern Time) Rectal Bleeding [1] MODERATE rectal bleeding (small blood clots, passing Lovelace, RN, Amy 08/10/2021 8:26:13 AM PLEASE NOTE: All timestamps contained within this report are represented as [place] Standard Time. CONFIDENTIALTY NOTICE: This fax transmission is intended only for the addressee. It contains information that is legally privileged, confidential or otherwise protected from use or disclosure. If you are not the intended recipient, you are strictly prohibited from reviewing, disclosing, copying using or disseminating any of this information or taking any action in reliance on or regarding this information. If you have received this fax in error, please notify us immediately by telephone so that we can arrange for its return to [place]. Phone: [phone], Toll-Free: [phone], Fax: [phone] Page: 2 of 2 Call Id: [id number] Guidelines Guideline Title Affirmed Question Affirmed Notes Nurse Date/Time Eilene Ghazi Time) blood without stool, or toilet water turns red) AND [2] more than once a day Disp. Time Eilene Ghazi Time) Disposition Final User 08/10/2021 8:27:43 AM Go to ED Now Yes Lovelace, RN, Amy Caller Disagree/Comply Comply Caller Understands Yes PreDisposition InappropriateToAsk Care Advice Given Per Guideline GO TO ED NOW: * You need to be seen in the Emergency Department. * Go to the ED at ___________ [place] now. Drive carefully. NOTE TO TRIAGER - DRIVING: * Another adult should drive. BRING MEDICINES: * Bring a list of your current medicines when you go to the Emergency Department (ER). * Bring the pill bottles too. This will help the doctor (or NP/PA) to make certain you are taking the right medicines and the right dose. CARE ADVICE given per Rectal Bleeding (Adult) guideline. Referrals [facility]

## 2021-08-10 NOTE — ED Provider Triage Note (Signed)
Emergency Medicine Provider Triage Evaluation Note  [name] , a 71 y.o. female  was evaluated in triage.  Pt complains of rectal bleeding onset 7 PM yesterday.  Patient has had associated diarrhea since yesterday (10 episodes yesterday and 3 episodes today) and lightheadedness.  She notes that her total volume of diarrhea has decreased since yesterday.  Patient now notes that her 3 episodes of diarrhea have been consistent of streaked blood without stool noted.  Patient has had 2 teaspoons of bright red blood with wiping every hour since yesterday.  She has tried Imodium for her symptoms.  Denies history of GERD, excessive NSAID use, EtOH, GI bleed.  Denies chest pain, shortness of breath, vomiting, fever, chills, palpitations, dizziness.  Patient takes Eliquis daily for afib.  Patient does have a gastroenterologist when she last saw for her colonoscopy, Dr. Havery Moros.  Review of Systems  Positive: As per HPI above Negative: Chest pain, shortness of breath  Physical Exam  BP (!) 150/63 (BP Location: Left Arm)    Pulse 86    Temp 97.9 F (36.6 C) (Oral)    Resp 16    Ht 5\' 5"  (1.651 m)    Wt 95.3 kg    SpO2 95%    BMI 34.95 kg/m  Gen:   Awake, no distress   Resp:  Normal effort  MSK:   Moves extremities without difficulty  Other:  No abdominal tenderness to palpation  Medical Decision Making  Medically screening exam initiated at 10:17 AM.  Appropriate orders placed.  [name] was informed that the remainder of the evaluation will be completed by another provider, this initial triage assessment does not replace that evaluation, and the importance of remaining in the ED until their evaluation is complete.  10:28 AM - Discussed with RN that patient is in need of a room immediately. RN aware and working on room placement.    Griffin Gerrard A, PA-C 08/10/21 1028

## 2021-08-10 NOTE — ED Triage Notes (Signed)
Patient states she began having diarrhea at 1400 yesterday. Patient states she was having frequent episodes and took imodium OTC. Patient states diarrhea decreased and is having 2 teaspoons of bright red blood approx every hour since 1900 yesterday.   Patient denies any vomiting.

## 2021-08-10 NOTE — Telephone Encounter (Signed)
Per chart review pt is at [facility] ED. Sending note to Dr Lorelei Pont and Butch Penny CMA.

## 2021-08-10 NOTE — ED Provider Notes (Signed)
[name]  CSN: [id number] Arrival date & time: 08/10/21 1001  Chief Complaint(s) Rectal Bleeding  HPI [name] is a 71 y.o. female with PMH paroxysmal A-fib on Eliquis, T2DM, fibromyalgia, diverticulosis, hypothyroidism, IBS who presents emergency department for evaluation of abdominal pain and hematochezia.  Patient states that she has had diarrhea for the last 24 hours that has been minimally responsive to Imodium.  She states that this morning she saw bright red blood in her stool and came to the emergency department for evaluation.  Endorses crampy left lower quadrant abdominal pain but denies chest pain, shortness of breath, nausea, vomiting, fever or other systemic symptoms.   Rectal Bleeding Associated symptoms: abdominal pain    Past Medical History Past Medical History:  Diagnosis Date   B12 deficiency    Complication of anesthesia    slow to wake up    Depression    Diabetes mellitus type 2, diet-controlled ([place]) 07/24/2018   Fibromyalgia    Generalized hyperhidrosis 04/17/2014   GERD (gastroesophageal reflux disease)    History of rheumatic fever as a child    Hyperlipemia    Hypertension    denies htn on 05/09/16   HYPOTHYROIDISM 05/29/2007   Irritable bowel syndrome    Migraines    hx of    OSA (obstructive sleep apnea) 06/26/2019   Senile calcific aortic valve sclerosis    Aortic Sclerosis-no stenosis   Patient Active Problem List   Diagnosis Date Noted   Chronic right sacroiliac pain 04/29/2021   Secondary hypercoagulable state ([place]) 08/29/2019   OSA (obstructive sleep apnea) 06/26/2019   Paroxysmal atrial fibrillation ([place]): CHA2D2DSVasc =4; Eliquis 05/10/2019   Diabetes mellitus type 2, diet-controlled ([place]) 07/24/2018   Obesity hypoventilation syndrome ([place]) 03/26/2015   Generalized hyperhidrosis 04/17/2014   Obesity (BMI 30-39.9) 08/23/2013   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 Authorizing Provider  acetaminophen (TYLENOL) 650 MG CR tablet Take 650 mg by mouth in the morning and at bedtime.    [provider]  Ascorbic Acid (VITAMIN C) 1000 MG tablet Take 500 mg by mouth daily. 1 Tablet Daily    [provider]  B Complex Vitamins (B COMPLEX 100 PO) Take 1 tablet by mouth every morning.     [provider]  Calcium Carbonate-Vitamin D (CALCIUM 600+D PO) Take 1 tablet by mouth in the morning and at bedtime.     [provider]  carboxymethylcellulose (REFRESH PLUS) 0.5 % SOLN Place 1 drop into both eyes 3 (three) times daily as needed (dry eyes).     [provider]  cetirizine (ZYRTEC) 10 MG tablet Take 10 mg by mouth at bedtime.     [provider]  Cholecalciferol (VITAMIN D) 125 MCG (5000 UT) CAPS Take 5,000 Units by mouth every morning.     [provider]  clindamycin (CLEOCIN T) 1 % lotion Apply 1 application topically daily as needed (eczema).  06/07/19   [provider]  Coenzyme Q-10 100 MG  capsule Take 100 mg by mouth every morning.     [provider]  diclofenac sodium (VOLTAREN) 1 % GEL Apply 4 g topically 4 (four) times daily as needed (joint pain).     [provider]  diltiazem (CARDIZEM CD) 240 MG 24 hr capsule TAKE 1 CAPSULE BY MOUTH EVERY DAY 11/11/20   [name], MD  diltiazem (CARDIZEM) 30 MG tablet Take one tablet by mouth every 4 hours  as needed for heart rate greater than 100, top number blood pressure needs to be above 100 04/14/21   Fenton, Clint R, PA  docusate sodium (COLACE) 250 MG capsule Take 250 mg by mouth at bedtime.    [provider]  ELIQUIS 5 MG TABS tablet TAKE 1 TABLET BY MOUTH  TWICE DAILY 07/19/21   [name], MD  Flaxseed, Linseed, (FLAXSEED OIL) 1000 MG CAPS Take 1,000 mg by mouth every other day. Take 1 capsule every other day.    [provider]  flecainide (TAMBOCOR) 50 MG tablet TAKE 1 TABLET BY MOUTH  TWICE DAILY 08/03/21   [name], MD  fluocinonide (LIDEX) 0.05 % external solution Apply 1 application topically daily as needed (eczema on scalp).  06/07/19   [provider]  gabapentin (NEURONTIN) 300 MG capsule TAKE 1 CAPSULE BY MOUTH 3  TIMES DAILY 04/12/21   Kirsteins, Luanna Salk, MD  GRAPE SEED EXTRACT PO Take 100 mg by mouth 2 (two) times daily.     [provider]  levothyroxine (SYNTHROID) 50 MCG tablet TAKE 1 TABLET BY MOUTH  DAILY 12/17/20   Copland, Frederico Hamman, MD  loratadine (CLARITIN) 10 MG tablet Take 10 mg by mouth every morning.     [provider]  Magnesium Bisglycinate (MAG GLYCINATE PO) Take 400 mg by mouth at bedtime.     [provider]  methocarbamol (ROBAXIN) 500 MG tablet TAKE 1 TABLET BY MOUTH EVERY 8 HOURS AS NEEDED FOR MUSCLE SPASMS. 04/09/21   Bayard Hugger, NP  mirtazapine (REMERON) 15 MG tablet TAKE 1 TABLET BY MOUTH AT  BEDTIME 03/01/21   Copland, Frederico Hamman, MD  morphine (MS CONTIN) 15 MG 12 hr tablet Take 1 tablet (15 mg total) by mouth 3 (three) times daily. 07/12/21   Bayard Hugger, NP  neomycin-polymyxin b-dexamethasone (MAXITROL) 3.5-10000-0.1 OINT SMARTSIG:Sparingly In Eye(s) 01/27/21   [provider]  oxybutynin (DITROPAN-XL) 10 MG 24 hr tablet Take 10 mg by mouth at bedtime.    [provider]  oxymetazoline (AFRIN) 0.05 % nasal spray Place 1 spray into both nostrils 2 (two) times daily as  needed for congestion.    [provider]  RABEprazole (ACIPHEX) 20 MG tablet TAKE 1 TABLET BY MOUTH EVERY DAY 01/11/21   Copland, Frederico Hamman, MD  senna (SENOKOT) 8.6 MG TABS tablet Take 4 tablets by mouth at bedtime.    [provider]  tretinoin (RETIN-A) 0.05 % cream Apply 1 application topically daily as needed (acne).  06/07/19   [provider]  Past Surgical History Past Surgical History:  Procedure Laterality Date   69 HOUR [place] STUDY N/A 01/04/2016   Procedure: 24 HOUR PH STUDY;  Surgeon: Manus Gunning, MD;  Location: [facility];  Service: Gastroenterology;  Laterality: N/A;   ABDOMINAL HYSTERECTOMY  1988   ADENOIDECTOMY     ANTERIOR CERVICAL DECOMP/DISCECTOMY FUSION  2009, 1993   first procedure in ~ 1993/Dr [place] N/A 04/03/2019   Procedure: CARDIOVERSION;  Surgeon: Dorothy Spark, MD;  Location: [place];  Service: Cardiovascular;  Laterality: N/A;   CHOLECYSTECTOMY  2005   ESOPHAGEAL MANOMETRY N/A 01/04/2016   Procedure: ESOPHAGEAL MANOMETRY (EM) 24 HR PH;  Surgeon: Manus Gunning, MD;  Location: [facility];  Service: Gastroenterology;  Laterality: N/A;   ESOPHAGOGASTRODUODENOSCOPY (EGD) WITH PROPOFOL N/A 05/17/2016   Procedure: ESOPHAGOGASTRODUODENOSCOPY (EGD) WITH PROPOFOL;  Surgeon: Manus Gunning, MD;  Location: [facility];  Service: Gastroenterology;  Laterality: N/A;   NASAL SINUS SURGERY  2006   SAVORY DILATION N/A 05/17/2016   Procedure: SAVORY DILATION;  Surgeon: Manus Gunning, MD;  Location: [facility];  Service: Gastroenterology;  Laterality: N/A;   TEE WITHOUT CARDIOVERSION N/A 04/03/2019   Procedure: TRANSESOPHAGEAL ECHOCARDIOGRAM (TEE)-with DCCV;  Surgeon: Dorothy Spark, MD;  Location: [facility] ENDOSCOPY;; EF 55 to  60%.  No LVH.  Normal RV.  Moderate LA dilation.  No thrombus.  Mild RA dilation.  Mild to moderate TR.  Mild aortic valve sclerosis, no stenosis.  Moderately elevated PA pressures (47 mmHg)   TONSILLECTOMY AND ADENOIDECTOMY     TRANSTHORACIC ECHOCARDIOGRAM  2009   Normal LV size and function. EF 60-65%. No regional wall motion and amount is. Mild aortic sclerosis, no stenosis   TUBAL LIGATION  1979   Family History Family History  Problem Relation Age of Onset   Breast cancer Mother    Diabetes Mother    Heart disease Mother    Allergies Mother    Diabetes Father    Heart disease Father    Emphysema Father    Allergies Father    Stomach cancer Maternal Uncle    Breast cancer Paternal Aunt    Colon cancer Neg Hx     Social History Social History   Tobacco Use   Smoking status: Never   Smokeless tobacco: Never  Vaping Use   Vaping Use: Never used  Substance Use Topics   Alcohol use: Yes    Alcohol/week: 1.0 standard drink    Types: 1 Glasses of wine per week    Comment: rare   Drug use: Never   Allergies Cyclobenzaprine hcl, Rizatriptan benzoate, Sumatriptan, Tizanidine, Aleve [naproxen sodium], Clarithromycin, Doxycycline, Duloxetine, Egg [eggs or egg-derived products], Keflex [cephalexin], Metronidazole, Oxycodone hcl, Oxycontin [oxycodone hcl], Penicillins, Rosuvastatin, Venlafaxine, Voltaren [diclofenac sodium], Septra [sulfamethoxazole-trimethoprim], and Tramadol  Review of Systems Review of Systems  Gastrointestinal:  Positive for abdominal pain, blood in stool, diarrhea and hematochezia.   Physical Exam Vital Signs  I have reviewed the triage vital signs BP (!) 118/54    Pulse 68    Temp 97.9 F (36.6 C) (Oral)    Resp 16    Ht 5\' 5"  (1.651 m)    Wt 95.3 kg    SpO2 97%    BMI 34.95 kg/m   Physical Exam Vitals and nursing Ryan reviewed.  Constitutional:      General: She is not in acute distress.    Appearance: She  is well-developed.  HENT:     Head:  Normocephalic and atraumatic.  Eyes:     Conjunctiva/sclera: Conjunctivae normal.  Cardiovascular:     Rate and Rhythm: Normal rate and regular rhythm.     Heart sounds: No murmur heard. Pulmonary:     Effort: Pulmonary effort is normal. No respiratory distress.     Breath sounds: Normal breath sounds.  Abdominal:     Palpations: Abdomen is soft.     Tenderness: There is abdominal tenderness (Left lower quadrant).  Musculoskeletal:        General: No swelling.     Cervical back: Neck supple.  Skin:    General: Skin is warm and dry.     Capillary Refill: Capillary refill takes less than 2 seconds.  Neurological:     Mental Status: She is alert.  Psychiatric:        Mood and Affect: Mood normal.    ED Results and Treatments Labs (all labs ordered are listed, but only abnormal results are displayed) Labs Reviewed  COMPREHENSIVE METABOLIC PANEL - Abnormal; Notable for the following components:      Result Value   Glucose, Bld 158 (*)    Total Protein 8.2 (*)    All other components within normal limits  CBC - Abnormal; Notable for the following components:   WBC 12.0 (*)    All other components within normal limits  POC OCCULT BLOOD, ED  TYPE AND SCREEN  ABO/RH                                                                                                                          Radiology No results found.  Pertinent labs & imaging results that were available during my care of the patient were reviewed by me and considered in my medical decision making (see MDM for details).  Medications Ordered in ED Medications  dicyclomine (BENTYL) capsule 20 mg (has no administration in time range)  iohexol (OMNIPAQUE) 300 MG/ML solution 100 mL (has no administration in time range)                                                                                                                                     Procedures Procedures  (including critical care time)  Medical  Decision Making / ED Course   This patient presents to the ED for concern of rectal bleeding  and diarrhea, this involves an extensive number of treatment options, and is a complaint that carries with it a high risk of complications and morbidity.  The differential diagnosis includes colitis, diverticulitis, gastroenteritis  MDM: Patient seen in the emergency department for evaluation of hematochezia and diarrhea.  Physical exam with mild left lower quadrant tenderness to palpation, no external hemorrhoids on rectal exam.  Laboratory valuation with a mild leukocytosis of 12.0 but is otherwise unremarkable.  CT abdomen pelvis with no evidence of diverticulitis but does show evidence of colitis near the splenic flexure.  I have very low suspicion for ischemic colitis as the patient has very minimal pain that is easily controlled with Bentyl, no tachycardia and normal bicarb.  On reevaluation, patient states pain has resolved and we had a discussion about the benefits of antibiotics with nonspecific colitis.  Using shared decision making, we decided to forego antibiotics at this time in favor of close GI follow-up.  Patient well-appearing on discharge, hemodynamically stable and patient was discharged with outpatient GI follow-up and Bentyl.   Additional history obtained: -Additional history obtained from husband -External records from outside source obtained and reviewed including: Chart review including previous notes, labs, imaging, consultation notes   Lab Tests: -I ordered, reviewed, and interpreted labs.   The pertinent results include:   Labs Reviewed  COMPREHENSIVE METABOLIC PANEL - Abnormal; Notable for the following components:      Result Value   Glucose, Bld 158 (*)    Total Protein 8.2 (*)    All other components within normal limits  CBC - Abnormal; Notable for the following components:   WBC 12.0 (*)    All other components within normal limits  POC OCCULT BLOOD, ED  TYPE AND  SCREEN  ABO/RH      Imaging Studies ordered: I ordered imaging studies including CTAP I independently visualized and interpreted imaging. I agree with the radiologist interpretation   Medicines ordered and prescription drug management: Meds ordered this encounter  Medications   dicyclomine (BENTYL) capsule 20 mg   iohexol (OMNIPAQUE) 300 MG/ML solution 100 mL    -I have reviewed the patients home medicines and have made adjustments as needed  Critical interventions none   Cardiac Monitoring: The patient was maintained on a cardiac monitor.  I personally viewed and interpreted the cardiac monitored which showed an underlying rhythm of: NSR  Social Determinants of Health:  Factors impacting patients care include: none   Reevaluation: After the interventions noted above, I reevaluated the patient and found that they have :improved  Co morbidities that complicate the patient evaluation  Past Medical History:  Diagnosis Date   B12 deficiency    Complication of anesthesia    slow to wake up    Depression    Diabetes mellitus type 2, diet-controlled ([place]) 07/24/2018   Fibromyalgia    Generalized hyperhidrosis 04/17/2014   GERD (gastroesophageal reflux disease)    History of rheumatic fever as a child    Hyperlipemia    Hypertension    denies htn on 05/09/16   HYPOTHYROIDISM 05/29/2007   Irritable bowel syndrome    Migraines    hx of    OSA (obstructive sleep apnea) 06/26/2019   Senile calcific aortic valve sclerosis    Aortic Sclerosis-no stenosis      Dispostion: I considered admission for this patient, but work-up with no significant anemia and she is safe for outpatient GI follow-up.     Final Clinical Impression(s) / ED Diagnoses Final diagnoses:  None     @PCDICTATION @    Antonique Langford, Debe Coder, MD 08/10/21 1527

## 2021-08-10 NOTE — ED Notes (Signed)
Pt got light headed upon moving  from lying to sitting and sitting to standing

## 2021-08-10 NOTE — Telephone Encounter (Signed)
Thank you for sending a letter.  I am forwarding it to [facility].  Hopefully this can get resolved.  I do think this probably has to do with credentialing.  Glenetta Hew, MD

## 2021-08-11 ENCOUNTER — Ambulatory Visit: Payer: Medicare Other | Admitting: Registered Nurse

## 2021-08-20 NOTE — Progress Notes (Signed)
08/23/2021 [name] [id number] [birth year]   ASSESSMENT AND PLAN:   Hematochezia/colitis/Lower AB pain -     CBC with Differential/Platelet; Future -     Comprehensive metabolic panel; Future -     DG Abd 1 View; Future Possible infectious but she is now having formed stools, no recent ABX High possibility of ischemic, on eliquis- discussed trying to avoid constipation which is difficult with pain meds. Last colon 2008, last cologuard 2020 Long discussion with patient, will schedule colonoscopy to evaluate further.  We have discussed the risks of bleeding, infection, perforation, medication reactions, a 10-20% miss rate for small colon cancer or polyp and remote risk of death associated with colonoscopy. All questions were answered and the patient acknowledges these risk and wishes to proceed.   Constipation Add on miralax/fiber to senokot at night Has failed amitiza, movantik with pain management Will need 2 day prep  Dysphagia, unspecified type  EGD to evaluate for structural abnormality, tumor, erosive/infectious esophagititis, and EOE.   If the EGD is negative can then proceed to barium swallow and/or esophageal manometry. I discussed risks of EGD with patient today, including risk of sedation, bleeding or perforation.  Patient provides understanding and gave verbal consent to proceed. May want to give gastroparesis diet  Paroxysmal atrial fibrillation [facility]): [id number] =4; Eliquis Patient told to hold her Eliquis for 2 days prior to time of procedure.  We will communicate with her prescribing physician Dr. Ellyn Hack to ensure that holding his Eliquis is acceptable for her.  We discussed the risk, benefits and alternatives to colonoscopy/endoscopy and she is agreeable and wishes to proceed. Alternatives to the procedure were discussed as well as risks and benefits of procedure including bleeding, perforation, infection, missed lesions, medication reactions and possible  hospitalization or surgery if complications occur explained. Additional rare but real risk of cardiovascular event such as heart attack or ischemia/infarct of other organs off Eliquis explained and need to seek urgent help if this occurs.  She is agreeable and wishes to proceed.   Diabetes mellitus type 2, diet-controlled ([place]) Well controlled   Obesity hypoventilation syndrome ([place]) On CPAP    Patient Care Team: Owens Loffler, MD as PCP - General [name], MD as PCP - Cardiology (Cardiology) Letta Pate, Luanna Salk, MD as Consulting Physician (Physical Medicine and Rehabilitation) Armbruster, Carlota Raspberry, MD as Consulting Physician (Gastroenterology) [name], MD as Consulting Physician (Cardiology) Chesley Mires, MD as Consulting Physician (Pulmonary Disease) Debbora Dus, [facility] as Pharmacist (Pharmacist)  HISTORY OF PRESENT ILLNESS: 71 y.o. female with a past medical history of paroxysmal atrial fibrillation on Eliquis x2-3 years, type 2 diabetes, diverticulosis, IBS and others listed below, presents for ER follow up for colitis.  Patient known to Dr. Havery Moros, last seen 2017 Patient was in the ER from 08/10/21, was in the hospital for Abdominal pain and hematochezia. CBC showed leukocytosis white blood cell 12, normal kidney, normal liver CT abdomen pelvis with contrast showed wall thickening at splenic flexure and proximal descending colon inflammatory versus infectious versus ischemic, enlarged fatty liver with unremarkable spleen.  Pancreatic atrophy 10/20/2006 Colon Dr. Deatra Ina normal 05/17/2016 EGD Dr. Havery Moros s/p dilatation.  Cologuard 08/05/2018 negative  She has intermittent diarrhea but has never had GI bleed in the past.  Normally has constipation due to medications, she is on senna and stool softener nightly, can have BM every 3-4 days.  When her heart goes into Afib for 1-2 days, she has nausea/vomiting/diarrhea that resolved, had an  episode 2 weeks  before.  Then on the 13th in the afternoon, she started with nausea without vomiting. and  diarrhea then afterwards with BRB in toilet, no rectal pain. With lower AB crampy pain.  Denies fever but after episode of diarrhea had chills.  At night she felt she would have lower AB cramping with just BRBPR.  Then she went to the ER.  Has lost 8 lbs, has not been eating well, yesterday had normal meal.  Denies close contacts ill, recent camping, recent travel. Patient denies recent antibiotic use in last 6 months.   She has had some GERd, has had some dysphagia with pill mid esophagus and needs to drink a lot of water.   Wt Readings from Last 3 Encounters:  08/23/21 202 lb (91.6 kg)  08/10/21 210 lb (95.3 kg)  07/21/21 206 lb 12.8 oz (93.8 kg)     External labs and notes reviewed this visit:  08/09/2021 CT AB and pelvis with contrast  There is abnormal wall thickening in the splenic flexure and proximal descending colon suggesting inflammatory or infectious or ischemic colitis. There is no evidence of intestinal obstruction or pneumoperitoneum. There is no hydronephrosis. Enlarged fatty liver. Other findings as described in the body of the report. CBC  08/10/2021  HGB 14.6 MCV 88.8 without evidence of anemia WBC 12.0 Platelets 309 Kidney function 08/10/2021  BUN 14 Cr 0.82  GFR >60  Potassium 3.9   LFTs 08/10/2021  AST 29 ALT 31 Alkphos 126 TBili 0.5 09/14/2020 TSH 3.52   Current Medications:   Current Outpatient Medications (Endocrine & Metabolic):    levothyroxine (SYNTHROID) 50 MCG tablet, TAKE 1 TABLET BY MOUTH  DAILY  Current Outpatient Medications (Cardiovascular):    diltiazem (CARDIZEM CD) 240 MG 24 hr capsule, TAKE 1 CAPSULE BY MOUTH EVERY DAY   diltiazem (CARDIZEM) 30 MG tablet, Take one tablet by mouth every 4 hours as needed for heart rate greater than 100, top number blood pressure needs to be above 100   flecainide (TAMBOCOR) 50 MG tablet, TAKE 1 TABLET BY MOUTH   TWICE DAILY  Current Outpatient Medications (Respiratory):    cetirizine (ZYRTEC) 10 MG tablet, Take 10 mg by mouth at bedtime.    loratadine (CLARITIN) 10 MG tablet, Take 10 mg by mouth every morning.    oxymetazoline (AFRIN) 0.05 % nasal spray, Place 1 spray into both nostrils 2 (two) times daily as needed for congestion.  Current Outpatient Medications (Analgesics):    acetaminophen (TYLENOL) 650 MG CR tablet, Take 650 mg by mouth in the morning and at bedtime.   morphine (MS CONTIN) 15 MG 12 hr tablet, Take 1 tablet (15 mg total) by mouth 3 (three) times daily.  Current Outpatient Medications (Hematological):    ELIQUIS 5 MG TABS tablet, TAKE 1 TABLET BY MOUTH  TWICE DAILY  Current Outpatient Medications (Other):    Ascorbic Acid (VITAMIN C) 1000 MG tablet, Take 500 mg by mouth daily. 1 Tablet Daily   B Complex Vitamins (B COMPLEX 100 PO), Take 1 tablet by mouth every morning.    Calcium Carbonate-Vitamin D (CALCIUM 600+D PO), Take 1 tablet by mouth in the morning and at bedtime.    carboxymethylcellulose (REFRESH PLUS) 0.5 % SOLN, Place 1 drop into both eyes 3 (three) times daily as needed (dry eyes).    Cholecalciferol (VITAMIN D) 125 MCG (5000 UT) CAPS, Take 5,000 Units by mouth every morning.    clindamycin (CLEOCIN T) 1 % lotion, Apply 1 application topically  daily as needed (eczema).    Coenzyme Q-10 100 MG capsule, Take 100 mg by mouth every morning.    diclofenac sodium (VOLTAREN) 1 % GEL, Apply 4 g topically 4 (four) times daily as needed (joint pain).    dicyclomine (BENTYL) 20 MG tablet, Take 1 tablet (20 mg total) by mouth 2 (two) times daily.   docusate sodium (COLACE) 250 MG capsule, Take 250 mg by mouth at bedtime.   Flaxseed, Linseed, (FLAXSEED OIL) 1000 MG CAPS, Take 1,000 mg by mouth every other day. Take 1 capsule every other day.   fluocinonide (LIDEX) 0.05 % external solution, Apply 1 application topically daily as needed (eczema on scalp).    gabapentin  (NEURONTIN) 300 MG capsule, TAKE 1 CAPSULE BY MOUTH 3  TIMES DAILY   GRAPE SEED EXTRACT PO, Take 100 mg by mouth 2 (two) times daily.    Magnesium Bisglycinate (MAG GLYCINATE PO), Take 400 mg by mouth at bedtime.    methocarbamol (ROBAXIN) 500 MG tablet, TAKE 1 TABLET BY MOUTH EVERY 8 HOURS AS NEEDED FOR MUSCLE SPASMS.   mirtazapine (REMERON) 15 MG tablet, TAKE 1 TABLET BY MOUTH AT  BEDTIME (Patient taking differently: Take 1/2 tab QHS)   neomycin-polymyxin b-dexamethasone (MAXITROL) 3.5-10000-0.1 OINT, SMARTSIG:Sparingly In Eye(s)   oxybutynin (DITROPAN-XL) 10 MG 24 hr tablet, Take 10 mg by mouth at bedtime.   RABEprazole (ACIPHEX) 20 MG tablet, TAKE 1 TABLET BY MOUTH EVERY DAY   senna (SENOKOT) 8.6 MG TABS tablet, Take 4 tablets by mouth at bedtime.   tretinoin (RETIN-A) 0.05 % cream, Apply 1 application topically daily as needed (acne).   Medical History:  Past Medical History:  Diagnosis Date   B12 deficiency    Complication of anesthesia    slow to wake up    Depression    Diabetes mellitus type 2, diet-controlled ([place]) 07/24/2018   Fibromyalgia    Generalized hyperhidrosis 04/17/2014   GERD (gastroesophageal reflux disease)    History of rheumatic fever as a child    Hyperlipemia    Hypertension    denies htn on 05/09/16   HYPOTHYROIDISM 05/29/2007   Irritable bowel syndrome    Migraines    hx of    OSA (obstructive sleep apnea) 06/26/2019   Senile calcific aortic valve sclerosis    Aortic Sclerosis-no stenosis   Allergies:  Allergies  Allergen Reactions   Cyclobenzaprine Hcl Anaphylaxis and Swelling    Reaction - throat closed, tongue swelling   Rizatriptan Benzoate Anaphylaxis    Throat and Tongue Swelling Closed   Sumatriptan Swelling    Throat closed & Tongue Swelling   Tizanidine Shortness Of Breath   Aleve [Naproxen Sodium] Hives   Clarithromycin Hives   Doxycycline Diarrhea and Nausea And Vomiting   Duloxetine Nausea Only   Egg [Eggs Or Egg-Derived Products]  Diarrhea and Nausea And Vomiting   Keflex [Cephalexin] Itching and Other (See Comments)    Sores   Metronidazole Diarrhea and Nausea Only   Oxycodone Hcl Diarrhea and Nausea And Vomiting   Oxycontin [Oxycodone Hcl] Nausea And Vomiting   Penicillins Other (See Comments)    Unknown reaction Did it involve swelling of the face/tongue/throat, SOB, or low BP? Unknown Did it involve sudden or severe rash/hives, skin peeling, or any reaction on the inside of your mouth or nose? Unknown Did you need to seek medical attention at a hospital or doctor's office? Unknown When did it last happen?   infant   If all above answers are NO,  may proceed with cephalosporin use.   Rosuvastatin Other (See Comments)    Muscle aches   Venlafaxine Hives   Voltaren [Diclofenac Sodium] Hives    Reaction to oral med, uses gel occasionally   Septra [Sulfamethoxazole-Trimethoprim] Itching and Rash   Tramadol Diarrhea, Nausea And Vomiting and Palpitations     Surgical History:  She  has a past surgical history that includes Appendectomy (1988); Cholecystectomy (2005); Tubal ligation (1979); Tonsillectomy and adenoidectomy; Anterior cervical decomp/discectomy fusion (2009, 1993); Nasal sinus surgery (2006); Abdominal hysterectomy (1988); Esophageal manometry (N/A, 01/04/2016); 24 hour ph study (N/A, 01/04/2016); transthoracic echocardiogram (2009); Back surgery; Adenoidectomy; Esophagogastroduodenoscopy (egd) with propofol (N/A, 05/17/2016); Savory dilation (N/A, 05/17/2016); TEE without cardioversion (N/A, 04/03/2019); and Cardioversion (N/A, 04/03/2019). Family History:  Her family history includes Allergies in her father and mother; Breast cancer in her mother and paternal aunt; Diabetes in her father and mother; Emphysema in her father; Heart disease in her father and mother. Social History:   reports that she has never smoked. She has never used smokeless tobacco. She reports current alcohol use of about 1.0 standard  drink per week. She reports that she does not use drugs.  REVIEW OF SYSTEMS  : All other systems reviewed and negative except where noted in the History of Present Illness.   PHYSICAL EXAM: BP 130/70    Pulse 69    Ht 5\' 5"  (1.651 m)    Wt 202 lb (91.6 kg)    BMI 33.61 kg/m  General:   Pleasant, well developed female in no acute distress Head:  Normocephalic and atraumatic. Eyes: sclerae anicteric,conjunctive pink  Heart:  regular rate and rhythm, systolic murmur Pulm: Clear anteriorly; no wheezing Abdomen:  Soft, Obese AB, skin exam normal, Normal bowel sounds. mild tenderness in the lower abdomen. Without guarding and Without rebound, without hepatomegaly. Extremities:  Without edema. Msk:  Symmetrical without gross deformities. Peripheral pulses intact.  Neurologic:  Alert and  oriented x4;  grossly normal neurologically. Skin:   Dry and intact without significant lesions or rashes. Psychiatric: Demonstrates good judgement and reason without abnormal affect or behaviors.   Vladimir Crofts, PA-C 10:36 AM

## 2021-08-23 ENCOUNTER — Encounter: Payer: Self-pay | Admitting: Registered Nurse

## 2021-08-23 ENCOUNTER — Other Ambulatory Visit (INDEPENDENT_AMBULATORY_CARE_PROVIDER_SITE_OTHER): Payer: Medicare Other

## 2021-08-23 ENCOUNTER — Other Ambulatory Visit: Payer: Self-pay

## 2021-08-23 ENCOUNTER — Ambulatory Visit: Payer: Medicare Other | Admitting: Physician Assistant

## 2021-08-23 ENCOUNTER — Telehealth: Payer: Self-pay

## 2021-08-23 ENCOUNTER — Encounter: Payer: Medicare Other | Attending: Physical Medicine & Rehabilitation | Admitting: Registered Nurse

## 2021-08-23 ENCOUNTER — Encounter: Payer: Self-pay | Admitting: Physician Assistant

## 2021-08-23 ENCOUNTER — Ambulatory Visit (INDEPENDENT_AMBULATORY_CARE_PROVIDER_SITE_OTHER)
Admission: RE | Admit: 2021-08-23 | Discharge: 2021-08-23 | Disposition: A | Discharge status: Home / Self Care | Payer: Medicare Other | Source: Ambulatory Visit | Attending: Physician Assistant | Admitting: Physician Assistant

## 2021-08-23 VITALS — BP 130/70 | HR 69 | Ht 65.0 in | Wt 202.0 lb

## 2021-08-23 VITALS — BP 123/68 | HR 65 | Ht 65.0 in | Wt 205.0 lb

## 2021-08-23 DIAGNOSIS — Z79899 Other long term (current) drug therapy: Secondary | ICD-10-CM

## 2021-08-23 DIAGNOSIS — E119 Type 2 diabetes mellitus without complications: Secondary | ICD-10-CM

## 2021-08-23 DIAGNOSIS — E538 Deficiency of other specified B group vitamins: Secondary | ICD-10-CM

## 2021-08-23 DIAGNOSIS — K921 Melena: Secondary | ICD-10-CM

## 2021-08-23 DIAGNOSIS — M546 Pain in thoracic spine: Secondary | ICD-10-CM | POA: Diagnosis not present

## 2021-08-23 DIAGNOSIS — E039 Hypothyroidism, unspecified: Secondary | ICD-10-CM

## 2021-08-23 DIAGNOSIS — R1310 Dysphagia, unspecified: Secondary | ICD-10-CM

## 2021-08-23 DIAGNOSIS — E785 Hyperlipidemia, unspecified: Secondary | ICD-10-CM

## 2021-08-23 DIAGNOSIS — E662 Morbid (severe) obesity with alveolar hypoventilation: Secondary | ICD-10-CM

## 2021-08-23 DIAGNOSIS — K529 Noninfective gastroenteritis and colitis, unspecified: Secondary | ICD-10-CM | POA: Diagnosis not present

## 2021-08-23 DIAGNOSIS — G894 Chronic pain syndrome: Secondary | ICD-10-CM | POA: Insufficient documentation

## 2021-08-23 DIAGNOSIS — M62830 Muscle spasm of back: Secondary | ICD-10-CM | POA: Insufficient documentation

## 2021-08-23 DIAGNOSIS — I480 Paroxysmal atrial fibrillation: Secondary | ICD-10-CM | POA: Diagnosis not present

## 2021-08-23 DIAGNOSIS — R1030 Lower abdominal pain, unspecified: Secondary | ICD-10-CM

## 2021-08-23 DIAGNOSIS — Z79891 Long term (current) use of opiate analgesic: Secondary | ICD-10-CM | POA: Insufficient documentation

## 2021-08-23 DIAGNOSIS — M170 Bilateral primary osteoarthritis of knee: Secondary | ICD-10-CM | POA: Diagnosis not present

## 2021-08-23 DIAGNOSIS — G8929 Other chronic pain: Secondary | ICD-10-CM | POA: Diagnosis not present

## 2021-08-23 DIAGNOSIS — E559 Vitamin D deficiency, unspecified: Secondary | ICD-10-CM

## 2021-08-23 DIAGNOSIS — Z5181 Encounter for therapeutic drug level monitoring: Secondary | ICD-10-CM | POA: Diagnosis not present

## 2021-08-23 LAB — LIPID PANEL
Cholesterol: 196 mg/dL (ref 0–200)
HDL: 45.8 mg/dL (ref >39.00)
NonHDL: 149.79
Total CHOL/HDL Ratio: 4
Triglycerides: 233 mg/dL — ABNORMAL HIGH (ref 0.0–149.0)
VLDL: 46.6 mg/dL — ABNORMAL HIGH (ref 0.0–40.0)

## 2021-08-23 LAB — CBC WITH DIFFERENTIAL/PLATELET
Basophils Absolute: 0 10*3/uL (ref 0.0–0.1)
Basophils Relative: 0.3 % (ref 0.0–3.0)
Eosinophils Absolute: 0.1 10*3/uL (ref 0.0–0.7)
Eosinophils Relative: 1.4 % (ref 0.0–5.0)
HCT: 40.8 % (ref 36.0–46.0)
Hemoglobin: 13.9 g/dL (ref 12.0–15.0)
Lymphocytes Relative: 32.9 % (ref 12.0–46.0)
Lymphs Abs: 2.2 10*3/uL (ref 0.7–4.0)
MCHC: 34 g/dL (ref 30.0–36.0)
MCV: 88.3 fl (ref 78.0–100.0)
Monocytes Absolute: 0.4 10*3/uL (ref 0.1–1.0)
Monocytes Relative: 5.3 % (ref 3.0–12.0)
Neutro Abs: 4 10*3/uL (ref 1.4–7.7)
Neutrophils Relative %: 60.1 % (ref 43.0–77.0)
Platelets: 291 10*3/uL (ref 150.0–400.0)
RBC: 4.62 Mil/uL (ref 3.87–5.11)
RDW: 12.3 % (ref 11.5–15.5)
WBC: 6.6 10*3/uL (ref 4.0–10.5)

## 2021-08-23 LAB — HEPATIC FUNCTION PANEL
ALT: 23 U/L (ref 0–35)
AST: 26 U/L (ref 0–37)
Albumin: 4.7 g/dL (ref 3.5–5.2)
Alkaline Phosphatase: 102 U/L (ref 39–117)
Bilirubin, Direct: 0 mg/dL (ref 0.0–0.3)
Total Bilirubin: 0.4 mg/dL (ref 0.2–1.2)
Total Protein: 7.9 g/dL (ref 6.0–8.3)

## 2021-08-23 LAB — COMPREHENSIVE METABOLIC PANEL
ALT: 23 U/L (ref 0–35)
AST: 26 U/L (ref 0–37)
Albumin: 4.8 g/dL (ref 3.5–5.2)
Alkaline Phosphatase: 103 U/L (ref 39–117)
BUN: 17 mg/dL (ref 6–23)
CO2: 29 mEq/L (ref 19–32)
Calcium: 10.2 mg/dL (ref 8.4–10.5)
Chloride: 100 mEq/L (ref 96–112)
Creatinine, Ser: 0.79 mg/dL (ref 0.40–1.20)
GFR: 76.11 mL/min (ref >60.00)
Glucose, Bld: 104 mg/dL — ABNORMAL HIGH (ref 70–99)
Potassium: 4.3 mEq/L (ref 3.5–5.1)
Sodium: 139 mEq/L (ref 135–145)
Total Bilirubin: 0.4 mg/dL (ref 0.2–1.2)
Total Protein: 8 g/dL (ref 6.0–8.3)

## 2021-08-23 LAB — HEMOGLOBIN A1C: Hgb A1c MFr Bld: 6.6 % — ABNORMAL HIGH (ref 4.6–6.5)

## 2021-08-23 LAB — MICROALBUMIN / CREATININE URINE RATIO
Creatinine,U: 210.4 mg/dL
Microalb Creat Ratio: 1.2 mg/g (ref 0.0–30.0)
Microalb, Ur: 2.6 mg/dL — ABNORMAL HIGH (ref 0.0–1.9)

## 2021-08-23 LAB — LDL CHOLESTEROL, DIRECT: Direct LDL: 132 mg/dL

## 2021-08-23 MED ORDER — PLENVU 140 G PO SOLR: 140.0000 g | ORAL | 0 refills | Status: DC

## 2021-08-23 MED ORDER — DICYCLOMINE HCL 20 MG PO TABS
20.0000 mg | ORAL_TABLET | Freq: Two times a day (BID) | ORAL | 0 refills | Status: DC
Start: 2021-08-23 — End: 2021-09-29

## 2021-08-23 MED ORDER — MORPHINE SULFATE ER 15 MG PO TBCR: 15.0000 mg | EXTENDED_RELEASE_TABLET | Freq: Three times a day (TID) | ORAL | 0 refills | Status: DC

## 2021-08-23 NOTE — Patient Instructions (Addendum)
If you are age 70 or older, your body mass index should be between 23-30. Your Body mass index is 33.61 kg/m. If this is out of the aforementioned range listed, please consider follow up with your Primary Care Provider.  If you are age 15 or younger, your body mass index should be between 19-25. Your Body mass index is 33.61 kg/m. If this is out of the aformentioned range listed, please consider follow up with your Primary Care Provider.   ________________________________________________________  The [facility] providers would like to encourage you to use [facility] to communicate with providers for non-urgent requests or questions.  Due to long hold times on the telephone, sending your provider a message by [facility] may be a faster and more efficient way to get a response.  Please allow 48 business hours for a response.  Please remember that this is for non-urgent requests.  _______________________________________________________  Your provider has requested that you go to the basement level for lab work before leaving today. Press "B" on the elevator. The lab is located at the first door on the left as you exit the elevator.  Please go to the basement for an X-ray today.   You will be contacted by our office prior to your procedure for directions on holding your Eliquis.  If you do not hear from our office 1 week prior to your scheduled procedure, please call [phone] to discuss.    You have been scheduled for an endoscopy and colonoscopy. Please follow the written instructions given to you at your visit today. Please pick up your prep supplies at the pharmacy within the next 1-3 days. If you use inhalers (even only as needed), please bring them with you on the day of your procedure.  Due to recent changes in healthcare laws, you may see the results of your imaging and laboratory studies on MyChart before your provider has had a chance to review them.  We understand that in some cases there may  be results that are confusing or concerning to you. Not all laboratory results come back in the same time frame and the provider may be waiting for multiple results in order to interpret others.  Please give [place] 48 hours in order for your provider to thoroughly review all the results before contacting the office for clarification of your results.      Recommend starting on a fiber supplement, can try metamucil first but if this causes gas/bloating switch to benefiber/citracel- Take with with a full 8 oz glass of water once a day. This can take 1 month to start helping, so try for at least one month.   Recommend increasing water and activity.   Get a squatty potty to use at home or try a stool, goal is to get your knees above your hips during a bowel movement.  Miralax is an osmotic laxative.  It only brings more water into the stool.  This is safe to take daily.  Can take up to 17 gram of miralax twice a day.  Mix with juice or coffee.  Start 1 capful at night for 3-4 days and reassess your response in 3-4 days.  You can increase and decrease the dose based on your response.  Remember, it can take up to 3-4 days to take effect OR for the effects to wear off.  I often pair this with benefiber in the morning to help assure the stool is not too loose.   Go to the ER if unable  to pass gas, severe AB pain, unable to hold down food, any shortness of breath of chest pain.   Constipation, Adult Constipation is when a person has fewer than three bowel movements in a week, has difficulty having a bowel movement, or has stools (feces) that are dry, hard, or larger than normal. Constipation may be caused by an underlying condition. It may become worse with age if a person takes certain medicines and does not take in enough fluids. Follow these instructions at home: Eating and drinking  Eat foods that have a lot of fiber, such as beans, whole grains, and fresh fruits and vegetables. Limit foods that  are low in fiber and high in fat and processed sugars, such as fried or sweet foods. These include french fries, hamburgers, cookies, candies, and soda. Drink enough fluid to keep your urine pale yellow. General instructions Exercise regularly or as told by your health care provider. Try to do 150 minutes of moderate exercise each week. Use the bathroom when you have the urge to go. Do not hold it in. Take over-the-counter and prescription medicines only as told by your health care provider. This includes any fiber supplements. During bowel movements: Practice deep breathing while relaxing the lower abdomen. Practice pelvic floor relaxation. Watch your condition for any changes. Let your health care provider know about them. Keep all follow-up visits as told by your health care provider. This is important. Contact a health care provider if: You have pain that gets worse. You have a fever. You do not have a bowel movement after 4 days. You vomit. You are not hungry or you lose weight. You are bleeding from the opening between the buttocks (anus). You have thin, pencil-like stools. Get help right away if: You have a fever and your symptoms suddenly get worse. You leak stool or have blood in your stool. Your abdomen is bloated. You have severe pain in your abdomen. You feel dizzy or you faint. Summary Constipation is when a person has fewer than three bowel movements in a week, has difficulty having a bowel movement, or has stools (feces) that are dry, hard, or larger than normal. Eat foods that have a lot of fiber, such as beans, whole grains, and fresh fruits and vegetables. Drink enough fluid to keep your urine pale yellow. Take over-the-counter and prescription medicines only as told by your health care provider. This includes any fiber supplements. This information is not intended to replace advice given to you by your health care provider. Make sure you discuss any questions you have  with your health care provider. Document Revised: 05/01/2019 Document Reviewed: 05/01/2019 Elsevier Patient Education  [place].

## 2021-08-23 NOTE — Progress Notes (Signed)
Subjective:    Patient ID: [name], female    DOB: 29-Nov-1950, 71 y.o.   MRN: [id number]  HPI: [name] is a 71 y.o. female who returns for follow up appointment for chronic pain and medication refill. She states her  pain is located in her mid- back. She rates her pain 4. Her current exercise regime is walking and performing stretching exercises.  [name] went to [facility] on 08/10/2021 for rectal bleeding, note was reviewed. She seen Gastroenterologist today, note was reviewed.  she  is scheduled for Colonoscopy and EGD on 10/11/2021.    [name] Morphine equivalent is 45.00 MME.   Last UDS was Performed on 06/10/2021, it was consistent.     Pain Inventory Average Pain 4 Pain Right Now 4 My pain is constant, burning, and aching  In the last 24 hours, has pain interfered with the following? General activity 4 Relation with others 4 Enjoyment of life 4 What TIME of day is your pain at its worst? evening and night Sleep (in general) Good  Pain is worse with: walking, bending, standing, and some activites Pain improves with: rest, heat/ice, and medication Relief from Meds: 5  Family History  Problem Relation Age of Onset   Breast cancer Mother    Diabetes Mother    Heart disease Mother    Allergies Mother    Diabetes Father    Heart disease Father    Emphysema Father    Allergies Father    Breast cancer Paternal Aunt    Colon cancer Neg Hx    Esophageal cancer Neg Hx    Social History   Socioeconomic History   Marital status: Married    Spouse name: Not on file   Number of children: Not on file   Years of education: Not on file   Highest education level: Not on file  Occupational History   Occupation: Radiation protection practitioner   Occupation: retired  Tobacco Use   Smoking status: Never   Smokeless tobacco: Never  Vaping Use   Vaping Use: Never used  Substance and Sexual Activity   Alcohol use: Yes    Alcohol/week: 1.0 standard drink    Types: 1  Glasses of wine per week    Comment: rare   Drug use: Never   Sexual activity: Not Currently  Other Topics Concern   Not on file  Social History Narrative   Not on file   Social Determinants of Health   Financial Resource Strain: Low Risk    Difficulty of Paying Living Expenses: Not hard at all  Food Insecurity: No Food Insecurity   Worried About Charity fundraiser in the Last Year: Never true   [place] in the Last Year: Never true  Transportation Needs: No Transportation Needs   Lack of Transportation (Medical): No   Lack of Transportation (Non-Medical): No  Physical Activity: Sufficiently Active   Days of Exercise per Week: 7 days   Minutes of Exercise per Session: 30 min  Stress: No Stress Concern Present   Feeling of Stress : Not at all  Social Connections: Socially Integrated   Frequency of Communication with Friends and Family: Three times a week   Frequency of Social Gatherings with Friends and Family: More than three times a week   Attends Religious Services: More than 4 times per year   Active Member of Clubs or Organizations: Yes   Attends Archivist Meetings: More than 4 times  per year   Marital Status: Married   Past Surgical History:  Procedure Laterality Date   36 HOUR [place] STUDY N/A 01/04/2016   Procedure: 24 HOUR PH STUDY;  Surgeon: Manus Gunning, MD;  Location: [facility];  Service: Gastroenterology;  Laterality: N/A;   ABDOMINAL HYSTERECTOMY  1988   ADENOIDECTOMY     ANTERIOR CERVICAL DECOMP/DISCECTOMY FUSION  2009, 1993   first procedure in ~ 1993/Dr [place] N/A 04/03/2019   Procedure: CARDIOVERSION;  Surgeon: Dorothy Spark, MD;  Location: [place];  Service: Cardiovascular;  Laterality: N/A;   CHOLECYSTECTOMY  2005   ESOPHAGEAL MANOMETRY N/A 01/04/2016   Procedure: ESOPHAGEAL MANOMETRY (EM) 24 HR PH;  Surgeon: Manus Gunning, MD;  Location: [facility];   Service: Gastroenterology;  Laterality: N/A;   ESOPHAGOGASTRODUODENOSCOPY (EGD) WITH PROPOFOL N/A 05/17/2016   Procedure: ESOPHAGOGASTRODUODENOSCOPY (EGD) WITH PROPOFOL;  Surgeon: Manus Gunning, MD;  Location: [facility];  Service: Gastroenterology;  Laterality: N/A;   NASAL SINUS SURGERY  2006   SAVORY DILATION N/A 05/17/2016   Procedure: SAVORY DILATION;  Surgeon: Manus Gunning, MD;  Location: [facility];  Service: Gastroenterology;  Laterality: N/A;   TEE WITHOUT CARDIOVERSION N/A 04/03/2019   Procedure: TRANSESOPHAGEAL ECHOCARDIOGRAM (TEE)-with DCCV;  Surgeon: Dorothy Spark, MD;  Location: [facility];; EF 55 to 60%.  No LVH.  Normal RV.  Moderate LA dilation.  No thrombus.  Mild RA dilation.  Mild to moderate TR.  Mild aortic valve sclerosis, no stenosis.  Moderately elevated PA pressures (47 mmHg)   TONSILLECTOMY AND ADENOIDECTOMY     TRANSTHORACIC ECHOCARDIOGRAM  2009   Normal LV size and function. EF 60-65%. No regional wall motion and amount is. Mild aortic sclerosis, no stenosis   TUBAL LIGATION  1979   Past Surgical History:  Procedure Laterality Date   70 HOUR [place] STUDY N/A 01/04/2016   Procedure: 24 HOUR PH STUDY;  Surgeon: Manus Gunning, MD;  Location: [facility];  Service: Gastroenterology;  Laterality: N/A;   ABDOMINAL HYSTERECTOMY  1988   ADENOIDECTOMY     ANTERIOR CERVICAL DECOMP/DISCECTOMY FUSION  2009, 1993   first procedure in ~ 1993/Dr [place] N/A 04/03/2019   Procedure: CARDIOVERSION;  Surgeon: Dorothy Spark, MD;  Location: [place];  Service: Cardiovascular;  Laterality: N/A;   CHOLECYSTECTOMY  2005   ESOPHAGEAL MANOMETRY N/A 01/04/2016   Procedure: ESOPHAGEAL MANOMETRY (EM) 24 HR PH;  Surgeon: Manus Gunning, MD;  Location: [facility];  Service: Gastroenterology;  Laterality: N/A;   ESOPHAGOGASTRODUODENOSCOPY (EGD) WITH PROPOFOL N/A 05/17/2016   Procedure:  ESOPHAGOGASTRODUODENOSCOPY (EGD) WITH PROPOFOL;  Surgeon: Manus Gunning, MD;  Location: [facility];  Service: Gastroenterology;  Laterality: N/A;   NASAL SINUS SURGERY  2006   SAVORY DILATION N/A 05/17/2016   Procedure: SAVORY DILATION;  Surgeon: Manus Gunning, MD;  Location: [facility];  Service: Gastroenterology;  Laterality: N/A;   TEE WITHOUT CARDIOVERSION N/A 04/03/2019   Procedure: TRANSESOPHAGEAL ECHOCARDIOGRAM (TEE)-with DCCV;  Surgeon: Dorothy Spark, MD;  Location: [facility] END[facility]COPY;; EF 55 to 60%.  No LVH.  Normal RV.  Moderate LA dilation.  No thrombus.  Mild RA dilation.  Mild to moderate TR.  Mild aortic valve sclerosis, no stenosis.  Moderately elevated PA pressures (47 mmHg)   TONSILLECTOMY AND ADENOIDECTOMY     TRANSTHORACIC ECHOCARDIOGRAM  2009  Normal LV size and function. EF 60-65%. No regional wall motion and amount is. Mild aortic sclerosis, no stenosis   TUBAL LIGATION  1979   Past Medical History:  Diagnosis Date   B12 deficiency    Complication of anesthesia    slow to wake up    Depression    Diabetes mellitus type 2, diet-controlled ([place]) 07/24/2018   Fibromyalgia    Generalized hyperhidrosis 04/17/2014   GERD (gastroesophageal reflux disease)    History of rheumatic fever as a child    Hyperlipemia    Hypertension    denies htn on 05/09/16   HYPOTHYROIDISM 05/29/2007   Irritable bowel syndrome    Migraines    hx of    OSA (obstructive sleep apnea) 06/26/2019   Senile calcific aortic valve sclerosis    Aortic Sclerosis-no stenosis   BP 123/68    Pulse 65    Ht 5\' 5"  (1.651 m)    Wt 205 lb (93 kg)    SpO2 96%    BMI 34.11 kg/m   Opioid Risk Score:   Fall Risk Score:  `1  Depression screen PHQ 2/9  Depression screen [facility] 2/9 08/23/2021 07/16/2021 07/12/2021 06/10/2021 05/31/2021 05/12/2021 04/29/2021  Decreased Interest 0 0 0 1 0 0 1  Down, Depressed, Hopeless 0 0 1 1 0 0 1  PHQ - 2 Score 0 0 1 2 0 0 2  Altered sleeping - - - - - - -   Tired, decreased energy - - - - - - -  Change in appetite - - - - - - -  Feeling bad or failure about yourself  - - - - - - -  Trouble concentrating - - - - - - -  Moving slowly or fidgety/restless - - - - - - -  Suicidal thoughts - - - - - - -  PHQ-9 Score - - - - - - -  Difficult doing work/chores - - - - - - -  Some recent data might be hidden     Review of Systems  Constitutional: Negative.   HENT: Negative.    Eyes: Negative.   Respiratory: Negative.    Cardiovascular: Negative.   Gastrointestinal: Negative.   Endocrine: Negative.   Genitourinary: Negative.   Musculoskeletal:  Positive for back pain, neck pain and neck stiffness.  Skin: Negative.   Allergic/Immunologic: Negative.   Neurological: Negative.   Hematological: Negative.   Psychiatric/Behavioral: Negative.        Objective:   Physical Exam Vitals and nursing note reviewed.  Constitutional:      Appearance: Normal appearance.  Cardiovascular:     Rate and Rhythm: Normal rate and regular rhythm.     Pulses: Normal pulses.     Heart sounds: Normal heart sounds.  Pulmonary:     Effort: Pulmonary effort is normal.     Breath sounds: Normal breath sounds.  Musculoskeletal:     Cervical back: Normal range of motion and neck supple.     Comments: Normal Muscle Bulk and Muscle Testing Reveals:  Upper Extremities: Full ROM and Muscle Strength  5/5 Thoracic Paraspinal Tenderness: T-7-T-9  Lower Extremities: Full ROM and Muscle Strength 5/5 Arises from Chair with ease Narrow Based  Gait     Skin:    General: Skin is warm and dry.  Neurological:     General: No focal deficit present.     Mental Status: She is alert.  Psychiatric:  Mood and Affect: Mood normal.        Behavior: Behavior normal.         Assessment & Plan:  1. Cervical postlaminectomy syndrome:/ Cervicalgia/ Cervical Radiculitis/ with neck and shoulder pain/ Chronic Midline Thoracic Pain.  Continue exercise routine and using heat  therapy. 08/23/2021 Continue : current medication regimen. Refilled: MS Contin 15 mg one tablet three times a day #90.   We will continue the opioid monitoring program, this consists of regular clinic visits, examinations, urine drug screen, pill counts as well as use of [place] Controlled Substance Reporting system. A 12 month History has been reviewed on the [place] Controlled Substance Reporting System on 08/23/2021.   2. Fibromyalgia/ Neuropathy: Continue current medication regimen with Gabapentin, activity and exercise regime. 02/27 /2023. 3. Bilateral Knee Pain/ Mild Degenerative Changes L>R: Scheduled for Left Knee Injection with Dr Letta Pate, she verbalizes understanding. Continue current medication regimen with Voltaren Gel. 08/23/2021. 4. Bilateral  Ankle Pain:  No complaints today. Continue HEP as tolerated. Continue to monitor.08/23/2021. 5. Muscle Spasm: Continue current medication regimen with  Robaxin. Continue to Monitor.08/23/2021.. 6. Opioid Induced Constipation: No complaints Continue to Monitor. 08/23/2021. 7. Midline Low Back Pain: Continue current medication regime, continue HEP as tolerated. 08/23/2021. 8. Right Gluteal Medius Weakness and Atrophy: No complaints Today:Continue with Hip Abduction Exercises as tolerated: Continue to Monitor. 08/23/2021. 9. Right Hip Pain/ Right Greater Trochanteric Tenderness: No complaints today. Continue to Alternate Ice and Heat Therapy. Continue to Monitor. 08/23/2021  10. Sacroiliac Joint Pain:  S/P Sacroiliac Injection with good relief noted. 08/23/2021 11. Lumbar Radiculitis: No complaints today.Continue Gabapentin. Continue HEP as tolerated. Continue current medication regimen. Continue to monitor.  08/23/2021.     F/U in 1 month

## 2021-08-23 NOTE — Telephone Encounter (Signed)
[facility] Pre-operative Risk Assessment     Request for surgical clearance:     Endoscopy Procedure  What type of surgery is being performed?     EGD/Colon  When is this surgery scheduled?     10-11-2021  What type of clearance is required ?   Pharmacy  Are there any medications that need to be held prior to surgery and how long? Yes, Eliquis, 2 days  Practice name and name of physician performing surgery?      [facility]  What is your office phone and fax number?      Phone- [phone]  [phone]  Anesthesia type (None, local, MAC, general) ?       MAC

## 2021-08-24 LAB — T3, FREE: T3, Free: 4.3 pg/mL — ABNORMAL HIGH (ref 2.3–4.2)

## 2021-08-24 LAB — VITAMIN B12: Vitamin B-12: 600 pg/mL (ref 211–911)

## 2021-08-24 LAB — T4, FREE: Free T4: 0.89 ng/dL (ref 0.60–1.60)

## 2021-08-24 LAB — VITAMIN D 25 HYDROXY (VIT D DEFICIENCY, FRACTURES): VITD: 44.28 ng/mL (ref 30.00–100.00)

## 2021-08-24 LAB — TSH: TSH: 4.09 u[IU]/mL (ref 0.35–5.50)

## 2021-08-24 NOTE — Telephone Encounter (Signed)
Patient has been notified and aware to hold her Eliquis 2 days before the EGD/Colon. No additional questions at the time of this call.

## 2021-08-24 NOTE — Progress Notes (Signed)
Agree with assessment and plan as outlined.  

## 2021-08-24 NOTE — Telephone Encounter (Signed)
Patient with diagnosis of afib on Eliquis for anticoagulation.    Procedure: EGD/colonoscopy Date of procedure: 10/11/21   CHA2DS2-VASc Score = 4   This indicates a 4.8% annual risk of stroke. The patient's score is based upon: CHF History: 0 HTN History: 1 Diabetes History: 1 Stroke History: 0 Vascular Disease History: 0 Age Score: 1 Gender Score: 1      CrCl 75.76 ml/min  Per office protocol, patient can hold Eliquis for 2 days prior to procedure.

## 2021-08-30 ENCOUNTER — Other Ambulatory Visit: Payer: Medicare Other

## 2021-09-04 ENCOUNTER — Encounter: Payer: Self-pay | Admitting: Cardiology

## 2021-09-05 ENCOUNTER — Encounter: Payer: Self-pay | Admitting: Cardiology

## 2021-09-05 NOTE — Assessment & Plan Note (Signed)
The patient understands the need to lose weight with diet and exercise. We have discussed specific strategies for this.  

## 2021-09-05 NOTE — Assessment & Plan Note (Signed)
Target LDL less than Coronary Calcium Score is less than 100. ? ?Has not had labs checked in a while. => Anticipate labs to be checked by PCP, if not checked we can recheck in follow-up. ?=> Low threshold to consider statin ?

## 2021-09-05 NOTE — Assessment & Plan Note (Signed)
Blood pressure looks pretty well controlled on current dose of diltiazem CD 240 mg daily. ?

## 2021-09-05 NOTE — Assessment & Plan Note (Signed)
Doing well.  No breakthrough episodes. ? ?Plan: ?? Continue standing dose of diltiazem 240 mg daily along with the [place] 50 mg twice daily. ?? On Eliquis ? ?Breakthrough A-fib: ?? Flecainide 100 mg twice daily for 2 days, also can take additional diltiazem short acting 30 mg for tachycardia ?

## 2021-09-06 ENCOUNTER — Other Ambulatory Visit: Payer: Self-pay

## 2021-09-06 ENCOUNTER — Encounter: Payer: Self-pay | Admitting: Family Medicine

## 2021-09-06 ENCOUNTER — Ambulatory Visit (INDEPENDENT_AMBULATORY_CARE_PROVIDER_SITE_OTHER): Payer: Medicare Other | Admitting: Family Medicine

## 2021-09-06 VITALS — BP 122/62 | HR 74 | Temp 98.9°F | Ht 64.25 in | Wt 203.4 lb

## 2021-09-06 DIAGNOSIS — Z0000 Encounter for general adult medical examination without abnormal findings: Secondary | ICD-10-CM | POA: Diagnosis not present

## 2021-09-06 NOTE — Patient Instructions (Signed)
Shingrix - need to get from your pharmacy. ? ? ?

## 2021-09-06 NOTE — Progress Notes (Unsigned)
Alicia Borre T. Conner Muegge, MD, [place] at [facility] [place], [address]  Phone: [phone]   FAX: [phone]  [name] - 71 y.o. female   MRN [id number]   Date of Birth: 1951-01-06  Date: 09/06/2021   PCP: Owens Loffler, MD   Referral: Owens Loffler, MD  Chief Complaint  Patient presents with   Annual Exam    MCR prt 2.     This visit occurred during the SARS-CoV-2 public health emergency.  Safety protocols were in place, including screening questions prior to the visit, additional usage of staff PPE, and extensive cleaning of exam room while observing appropriate contact time as indicated for disinfecting solutions.   Patient Care Team: Owens Loffler, MD as PCP - General [name], MD as PCP - Cardiology (Cardiology) Letta Pate Luanna Salk, MD as Consulting Physician (Physical Medicine and Rehabilitation) Armbruster, Carlota Raspberry, MD as Consulting Physician (Gastroenterology) [name], MD as Consulting Physician (Cardiology) Chesley Mires, MD as Consulting Physician (Pulmonary Disease) Debbora Dus, [facility] as Pharmacist (Pharmacist) Subjective:   [name] is a 71 y.o. pleasant patient who presents with the following:  Health Maintenance Summary Reviewed and updated, unless pt declines services.  Tobacco History Reviewed. Non-smoker Alcohol: No concerns, no excessive use Exercise Habits: Some activity, rec at least 30 mins 5 times a week STD concerns: none Drug Use: None Lumps or breast concerns: no  Sihngrix Covid Bivalent - does not want it. Colonoscopy pending Flu  Review all labs:  Not feeling well and taking Mucinex.  Had some diarrhea  - went to see gastroenterology Ischemic colitis? EGD and colon Will not get the flu vaccine.  Walks quite a bit 7000 - 10000 steps.    Health Maintenance  Topic Date Due   Zoster Vaccines- Shingrix (1 of 2) Never done   FOOT EXAM   11/30/2018   COVID-19 Vaccine (3 - Pfizer risk series) 10/16/2019   INFLUENZA VACCINE  01/25/2021   Fecal DNA (Cologuard)  08/05/2021   OPHTHALMOLOGY EXAM  10/29/2021   HEMOGLOBIN A1C  02/20/2022   URINE MICROALBUMIN  08/23/2022   MAMMOGRAM  12/23/2022   TETANUS/TDAP  10/17/2023   Pneumonia Vaccine 46+ Years old  Completed   DEXA SCAN  Completed   Hepatitis C Screening  Completed   HPV VACCINES  Aged Out    Immunization History  Administered Date(s) Administered   PFIZER(Purple Top)SARS-COV-2 Vaccination 08/22/2019, 09/18/2019   Pneumococcal Conjugate-13 01/02/2017   Pneumococcal Polysaccharide-23 05/06/2013, 07/25/2018   Td 06/28/2003, 10/16/2013   Zoster, Live 11/07/2012   Patient Active Problem List   Diagnosis Date Noted   Postlaminectomy syndrome, cervical region 08/29/2011    Priority: High   FIBROMYALGIA 01/11/2007    Priority: High   Depression, major, recurrent, in partial remission ([place]) 09/22/2008    Priority: Medium    Hypothyroidism 05/29/2007    Priority: Medium    Hyperlipidemia with target LDL less than 100 05/29/2007    Priority: Medium    Essential hypertension 11/13/2006    Priority: Medium    Chronic right sacroiliac pain 04/29/2021   Secondary hypercoagulable state ([place]) 08/29/2019   OSA (obstructive sleep apnea) 06/26/2019   Paroxysmal atrial fibrillation ([place]): CHA2D2DSVasc =4; Eliquis 05/10/2019   Diabetes mellitus type 2, diet-controlled ([place]) 07/24/2018   Obesity hypoventilation syndrome ([place]) 03/26/2015   Generalized hyperhidrosis 04/17/2014   Obesity (BMI 30-39.9) 08/23/2013   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 08/29/2011  ALLERGIC RHINITIS 09/22/2008   Vitamin D deficiency 01/11/2007   Vitamin B 12 deficiency 11/13/2006   GERD 11/13/2006    Past Medical History:  Diagnosis Date   B12 deficiency    Complication of anesthesia    slow to wake up    Depression    Diabetes mellitus type 2, diet-controlled  ([place]) 07/24/2018   Fibromyalgia    Generalized hyperhidrosis 04/17/2014   GERD (gastroesophageal reflux disease)    History of rheumatic fever as a child    Hyperlipemia    Hypertension    denies htn on 05/09/16   HYPOTHYROIDISM 05/29/2007   Irritable bowel syndrome    Migraines    hx of    OSA (obstructive sleep apnea) 06/26/2019   Senile calcific aortic valve sclerosis    Aortic Sclerosis-no stenosis    Past Surgical History:  Procedure Laterality Date   3 HOUR [place] STUDY N/A 01/04/2016   Procedure: 24 HOUR [place] STUDY;  Surgeon: Manus Gunning, MD;  Location: [facility];  Service: Gastroenterology;  Laterality: N/A;   ABDOMINAL HYSTERECTOMY  1988   ADENOIDECTOMY     ANTERIOR CERVICAL DECOMP/DISCECTOMY FUSION  2009, 1993   first procedure in ~ 1993/Dr [place] N/A 04/03/2019   Procedure: CARDIOVERSION;  Surgeon: Dorothy Spark, MD;  Location: [place];  Service: Cardiovascular;  Laterality: N/A;   CHOLECYSTECTOMY  2005   ESOPHAGEAL MANOMETRY N/A 01/04/2016   Procedure: ESOPHAGEAL MANOMETRY (EM) 24 HR PH;  Surgeon: Manus Gunning, MD;  Location: [facility];  Service: Gastroenterology;  Laterality: N/A;   ESOPHAGOGASTRODUODENOSCOPY (EGD) WITH PROPOFOL N/A 05/17/2016   Procedure: ESOPHAGOGASTRODUODENOSCOPY (EGD) WITH PROPOFOL;  Surgeon: Manus Gunning, MD;  Location: [facility];  Service: Gastroenterology;  Laterality: N/A;   NASAL SINUS SURGERY  2006   SAVORY DILATION N/A 05/17/2016   Procedure: SAVORY DILATION;  Surgeon: Manus Gunning, MD;  Location: [facility];  Service: Gastroenterology;  Laterality: N/A;   TEE WITHOUT CARDIOVERSION N/A 04/03/2019   Procedure: TRANSESOPHAGEAL ECHOCARDIOGRAM (TEE)-with DCCV;  Surgeon: Dorothy Spark, MD;  Location: [facility] END[facility]COPY;; EF 55 to 60%.  No LVH.  Normal RV.  Moderate LA dilation.  No thrombus.  Mild RA dilation.  Mild to moderate TR.  Mild  aortic valve sclerosis, no stenosis.  Moderately elevated PA pressures (47 mmHg)   TONSILLECTOMY AND ADENOIDECTOMY     TRANSTHORACIC ECHOCARDIOGRAM  2009   Normal LV size and function. EF 60-65%. No regional wall motion and amount is. Mild aortic sclerosis, no stenosis   TUBAL LIGATION  1979    Family History  Problem Relation Age of Onset   Breast cancer Mother    Diabetes Mother    Heart disease Mother    Allergies Mother    Diabetes Father    Heart disease Father    Emphysema Father    Allergies Father    Breast cancer Paternal Aunt    Colon cancer Neg Hx    Esophageal cancer Neg Hx     Past Medical History, Surgical History, Social History, Family History, Problem List, Medications, and Allergies have been reviewed and updated if relevant.  Review of Systems: Pertinent positives are listed above.  Otherwise, a full 14 point review of systems has been done in full and it is negative except where it is noted positive.  Objective:   BP 122/62    Pulse 74    Temp 98.9 F (  37.2 C) (Temporal)    Ht 5' 4.25" (1.632 m)    Wt 203 lb 6 oz (92.3 kg)    SpO2 96%    BMI 34.64 kg/m  Ideal Body Weight: Weight in (lb) to have BMI = 25: 146.5 No results found. Depression screen [facility] Helper 2/9 08/23/2021 07/16/2021 07/12/2021 06/10/2021 05/31/2021  Decreased Interest 0 0 0 1 0  Down, Depressed, Hopeless 0 0 1 1 0  PHQ - 2 Score 0 0 1 2 0  Altered sleeping - - - - -  Tired, decreased energy - - - - -  Change in appetite - - - - -  Feeling bad or failure about yourself  - - - - -  Trouble concentrating - - - - -  Moving slowly or fidgety/restless - - - - -  Suicidal thoughts - - - - -  PHQ-9 Score - - - - -  Difficult doing work/chores - - - - -  Some recent data might be hidden     GEN: well developed, well nourished, no acute distress Eyes: conjunctiva and lids normal, PERRLA, EOMI ENT: TM clear, nares clear, oral exam WNL Neck: supple, no lymphadenopathy, no thyromegaly, no JVD Pulm:  clear to auscultation and percussion, respiratory effort normal CV: regular rate and rhythm, S1-S2, no murmur, rub or gallop, no bruits Chest: no scars, masses, no lumps BREAST: breast exam declined GI: soft, non-tender; no hepatosplenomegaly, masses; active bowel sounds all quadrants GU: GU exam declined Lymph: no cervical, axillary or inguinal adenopathy MSK: gait normal, muscle tone and strength WNL, no joint swelling, effusions, discoloration, crepitus  SKIN: clear, good turgor, color WNL, no rashes, lesions, or ulcerations Neuro: normal mental status, normal strength, sensation, and motion Psych: alert; oriented to person, place and time, normally interactive and not anxious or depressed in appearance.   All labs reviewed with patient. Results for orders placed or performed in visit on 08/23/21  Vitamin B12  Result Value Ref Range   Vitamin B-12 600 211 - 911 pg/mL  VITAMIN D 25 Hydroxy (Vit-D Deficiency, Fractures)  Result Value Ref Range   VITD 44.28 30.00 - 100.00 ng/mL  TSH  Result Value Ref Range   TSH 4.09 0.35 - 5.50 uIU/mL  T4, free  Result Value Ref Range   Free T4 0.89 0.60 - 1.60 ng/dL  T3, free  Result Value Ref Range   T3, Free 4.3 (H) 2.3 - 4.2 pg/mL  Microalbumin / creatinine urine ratio  Result Value Ref Range   Microalb, Ur 2.6 (H) 0.0 - 1.9 mg/dL   Creatinine,U 210.4 mg/dL   Microalb Creat Ratio 1.2 0.0 - 30.0 mg/g  Hemoglobin A1c  Result Value Ref Range   Hgb A1c MFr Bld 6.6 (H) 4.6 - 6.5 %  Hepatic function panel  Result Value Ref Range   Total Bilirubin 0.4 0.2 - 1.2 mg/dL   Bilirubin, Direct 0.0 0.0 - 0.3 mg/dL   Alkaline Phosphatase 102 39 - 117 U/L   AST 26 0 - 37 U/L   ALT 23 0 - 35 U/L   Total Protein 7.9 6.0 - 8.3 g/dL   Albumin 4.7 3.5 - 5.2 g/dL  Lipid panel  Result Value Ref Range   Cholesterol 196 0 - 200 mg/dL   Triglycerides 233.0 (H) 0.0 - 149.0 mg/dL   HDL 45.80 >39.00 mg/dL   VLDL 46.6 (H) 0.0 - 40.0 mg/dL   Total CHOL/HDL  Ratio 4    NonHDL 149.79   LDL  cholesterol, direct  Result Value Ref Range   Direct LDL 132.0 mg/dL   *Note: Due to a large number of results and/or encounters for the requested time period, some results have not been displayed. A complete set of results can be found in Results Review.   DG Abd 1 View  Result Date: 08/25/2021 CLINICAL DATA:  Lower abdominal pain and cramping EXAM: ABDOMEN - 1 VIEW COMPARISON:  None. FINDINGS: Inferior pelvis excluded from view. Moderate stool noted throughout the visualized colon. Nonobstructive bowel gas pattern. No free intraperitoneal gas. Cholecystectomy clips in the right upper quadrant. No organomegaly. IMPRESSION: Nonobstructive bowel gas pattern.  Moderate stool burden. Electronically Signed   By: Fidela Salisbury M.D.   On: 08/25/2021 00:18   CT ABDOMEN PELVIS W CONTRAST  Result Date: 08/10/2021 CLINICAL DATA:  Pain left lower quadrant of abdomen, rectal bleeding, diarrhea EXAM: CT ABDOMEN AND PELVIS WITH CONTRAST TECHNIQUE: Multidetector CT imaging of the abdomen and pelvis was performed using the standard protocol following bolus administration of intravenous contrast. RADIATION DOSE REDUCTION: This exam was performed according to the departmental dose-optimization program which includes automated exposure control, adjustment of the mA and/or kV according to patient size and/or use of iterative reconstruction technique. CONTRAST:  110m OMNIPAQUE IOHEXOL 300 MG/ML  SOLN COMPARISON:  None. FINDINGS: Lower chest: Unremarkable. Hepatobiliary: Liver measures 18 cm. There is fatty infiltration. Surgical clips are seen in gallbladder fossa. Pancreas: There is atrophy.  No focal abnormality is seen. Spleen: Unremarkable. Adrenals/Urinary Tract: Adrenals are unremarkable. There is no hydronephrosis. There are no renal or ureteral stones. Urinary bladder is not distended. Stomach/Bowel: Stomach is unremarkable. Small bowel loops are not dilated. Appendix is not seen.  There is no pericecal inflammation. There is no significant wall thickening in the cecum. There is diffuse wall thickening in the splenic flexure and proximal descending colon. There is incomplete distention of sigmoid colon without significant wall thickening. Rectum is unremarkable. Vascular/Lymphatic: There are scattered calcifications in the aorta and its major branches. Reproductive: Uterus is not seen suggesting hysterectomy. There are no adnexal masses Other: There is no ascites or pneumoperitoneum. Musculoskeletal: Degenerative changes are noted with encroachment of neural foramina at multiple levels in the lower thoracic spine and lumbar spine. IMPRESSION: There is abnormal wall thickening in the splenic flexure and proximal descending colon suggesting inflammatory or infectious or ischemic colitis. There is no evidence of intestinal obstruction or pneumoperitoneum. There is no hydronephrosis. Enlarged fatty liver. Other findings as described in the body of the report. Electronically Signed   By: PElmer PickerM.D.   On: 08/10/2021 13:40    Assessment and Plan:     ICD-10-CM   1. Healthcare maintenance  Z00.00       Health Maintenance Exam: The patient's preventative maintenance and recommended screening tests for an annual wellness exam were reviewed in full today. Brought up to date unless services declined.  Counselled on the importance of diet, exercise, and its role in overall health and mortality. The patient's FH and SH was reviewed, including their home life, tobacco status, and drug and alcohol status.  Follow-up in 1 year for physical exam or additional follow-up below.  Follow-up: No follow-ups on file. Or follow-up in 1 year if not noted.  Future Appointments  Date Time Provider [place] 09/16/2021  3:45 PM [facility] PHARMACIST [facility]  10/06/2021  1:40 PM TBayard Hugger NP [facility] CPR  10/11/2021  2:30 PM Armbruster, SCarlota Raspberry MD [facility]  10/26/2021  2:00 PM Fenton, Clint R, PA [facility] None  06/06/2022 11:15 AM [facility] NURSE HEALTH ADVISOR [facility]    No orders of the defined types were placed in this encounter.  Medications Discontinued During This Encounter  Medication Reason   Ascorbic Acid (VITAMIN C) 1000 MG tablet Change in therapy   No orders of the defined types were placed in this encounter.   Signed,  Maud Deed. Isolde Skaff, MD   Allergies as of 09/06/2021       Reactions   Cyclobenzaprine Hcl Anaphylaxis, Swelling   Reaction - throat closed, tongue swelling   Rizatriptan Benzoate Anaphylaxis   Throat and Tongue Swelling Closed   Sumatriptan Swelling   Throat closed & Tongue Swelling   Tizanidine Shortness Of Breath   Aleve [naproxen Sodium] Hives   Clarithromycin Hives   Corn-containing Products Other (See Comments)   Head congestion   Doxycycline Diarrhea, Nausea And Vomiting   Duloxetine Nausea Only   Egg [eggs Or Egg-derived Products] Diarrhea, Nausea And Vomiting   Keflex [cephalexin] Itching, Other (See Comments)   Sores   Metronidazole Diarrhea, Nausea Only   Oxycodone Hcl Diarrhea, Nausea And Vomiting   Oxycontin [oxycodone Hcl] Nausea And Vomiting   Penicillins Other (See Comments)   Unknown reaction Did it involve swelling of the face/tongue/throat, SOB, or low BP? Unknown Did it involve sudden or severe rash/hives, skin peeling, or any reaction on the inside of your mouth or nose? Unknown Did you need to seek medical attention at a hospital or doctor's office? Unknown When did it last happen?   infant   If all above answers are NO, may proceed with cephalosporin use.   Rosuvastatin Other (See Comments)   Muscle aches   Venlafaxine Hives   Voltaren [diclofenac Sodium] Hives   Reaction to oral med, uses gel occasionally   Septra [sulfamethoxazole-trimethoprim] Itching, Rash   Tramadol Diarrhea, Nausea And Vomiting, Palpitations        Medication List        Accurate  as of September 06, 2021  2:53 PM. If you have any questions, ask your nurse or doctor.          acetaminophen 650 MG CR tablet Commonly known as: TYLENOL Take 650 mg by mouth in the morning and at bedtime.   B COMPLEX 100 PO Take 1 tablet by mouth every morning.   Benefiber Chew Chew by mouth 2 (two) times daily. Takes 2 gummies 2 times a day.   CALCIUM 600+D PO Take 1 tablet by mouth in the morning and at bedtime.   carboxymethylcellulose 0.5 % Soln Commonly known as: REFRESH PLUS Place 1 drop into both eyes 3 (three) times daily as needed (dry eyes).   cetirizine 10 MG tablet Commonly known as: ZYRTEC Take 10 mg by mouth at bedtime.   clindamycin 1 % lotion Commonly known as: CLEOCIN T Apply 1 application topically daily as needed (eczema).   Coenzyme Q-10 100 MG capsule Take 100 mg by mouth every morning.   diclofenac sodium 1 % Gel Commonly known as: VOLTAREN Apply 4 g topically 4 (four) times daily as needed (joint pain).   dicyclomine 20 MG tablet Commonly known as: BENTYL Take 1 tablet (20 mg total) by mouth 2 (two) times daily. What changed: additional instructions   diltiazem 240 MG 24 hr capsule Commonly known as: CARDIZEM CD TAKE 1 CAPSULE BY MOUTH EVERY DAY   diltiazem 30 MG tablet Commonly known as: Cardizem Take one tablet by  mouth every 4 hours as needed for heart rate greater than 100, top number blood pressure needs to be above 100   docusate sodium 250 MG capsule Commonly known as: COLACE Take 250 mg by mouth at bedtime.   Eliquis 5 MG Tabs tablet Generic drug: apixaban TAKE 1 TABLET BY MOUTH  TWICE DAILY   Flaxseed Oil 1000 MG Caps Take 1,000 mg by mouth every other day. Take 1 capsule every other day.   flecainide 50 MG tablet Commonly known as: TAMBOCOR TAKE 1 TABLET BY MOUTH  TWICE DAILY   fluocinonide 0.05 % external solution Commonly known as: LIDEX Apply 1 application topically daily as needed (eczema on scalp).   gabapentin  300 MG capsule Commonly known as: NEURONTIN TAKE 1 CAPSULE BY MOUTH 3  TIMES DAILY   GRAPE SEED EXTRACT PO Take 100 mg by mouth 2 (two) times daily.   levothyroxine 50 MCG tablet Commonly known as: SYNTHROID TAKE 1 TABLET BY MOUTH  DAILY   loratadine 10 MG tablet Commonly known as: CLARITIN Take 10 mg by mouth every morning.   MAG GLYCINATE PO Take 400 mg by mouth at bedtime.   methocarbamol 500 MG tablet Commonly known as: ROBAXIN TAKE 1 TABLET BY MOUTH EVERY 8 HOURS AS NEEDED FOR MUSCLE SPASMS.   MiraLax 17 GM/SCOOP powder Generic drug: polyethylene glycol powder Take 1 Container by mouth at bedtime.   mirtazapine 15 MG tablet Commonly known as: REMERON TAKE 1 TABLET BY MOUTH AT  BEDTIME What changed:  how much to take how to take this when to take this additional instructions   morphine 15 MG 12 hr tablet Commonly known as: MS Contin Take 1 tablet (15 mg total) by mouth 3 (three) times daily. Do Not Fill Before 09/11/2021   neomycin-polymyxin b-dexamethasone 3.5-10000-0.1 Oint Commonly known as: MAXITROL SMARTSIG:Sparingly In Eye(s)   oxybutynin 10 MG 24 hr tablet Commonly known as: DITROPAN-XL Take 10 mg by mouth at bedtime.   oxymetazoline 0.05 % nasal spray Commonly known as: AFRIN Place 1 spray into both nostrils 2 (two) times daily as needed for congestion.   Plenvu 140 g Solr Generic drug: PEG-KCl-NaCl-NaSulf-Na Asc-C Take 140 g by mouth as directed. Manufacturer's coupon Universal coupon code:BIN: P2366821; GROUP: [id number]; PCN: [id number]; ID: [id number]; PAY NO MORE $50   RABEprazole 20 MG tablet Commonly known as: ACIPHEX TAKE 1 TABLET BY MOUTH EVERY DAY   senna 8.6 MG Tabs tablet Commonly known as: SENOKOT Take 1 tablet by mouth at bedtime.   tretinoin 0.05 % cream Commonly known as: RETIN-A Apply 1 application topically daily as needed (acne).   vitamin C 500 MG tablet Commonly known as: ASCORBIC ACID Take 500 mg by mouth daily. What  changed: Another medication with the same name was removed. Continue taking this medication, and follow the directions you see here. Changed by: Owens Loffler, MD   Vitamin D 125 MCG (5000 UT) Caps Take 5,000 Units by mouth every morning.

## 2021-09-07 ENCOUNTER — Encounter: Payer: Self-pay | Admitting: Family Medicine

## 2021-09-10 ENCOUNTER — Emergency Department (HOSPITAL_COMMUNITY)
Admission: EM | Admit: 2021-09-10 | Discharge: 2021-09-10 | Disposition: A | Discharge status: Home / Self Care | Payer: Medicare Other | Attending: Emergency Medicine | Admitting: Emergency Medicine

## 2021-09-10 ENCOUNTER — Other Ambulatory Visit: Payer: Self-pay | Admitting: Family Medicine

## 2021-09-10 ENCOUNTER — Other Ambulatory Visit: Payer: Self-pay

## 2021-09-10 ENCOUNTER — Encounter (HOSPITAL_COMMUNITY): Payer: Self-pay

## 2021-09-10 ENCOUNTER — Telehealth (HOSPITAL_COMMUNITY): Payer: Self-pay | Admitting: *Deleted

## 2021-09-10 ENCOUNTER — Emergency Department (HOSPITAL_COMMUNITY): Payer: Medicare Other

## 2021-09-10 DIAGNOSIS — R0602 Shortness of breath: Secondary | ICD-10-CM | POA: Diagnosis not present

## 2021-09-10 DIAGNOSIS — Z79899 Other long term (current) drug therapy: Secondary | ICD-10-CM | POA: Insufficient documentation

## 2021-09-10 DIAGNOSIS — Z20822 Contact with and (suspected) exposure to covid-19: Secondary | ICD-10-CM | POA: Insufficient documentation

## 2021-09-10 DIAGNOSIS — I4891 Unspecified atrial fibrillation: Secondary | ICD-10-CM | POA: Diagnosis not present

## 2021-09-10 DIAGNOSIS — R059 Cough, unspecified: Secondary | ICD-10-CM | POA: Diagnosis not present

## 2021-09-10 DIAGNOSIS — Z7901 Long term (current) use of anticoagulants: Secondary | ICD-10-CM | POA: Diagnosis not present

## 2021-09-10 LAB — CBC
HCT: 39.6 % (ref 36.0–46.0)
Hemoglobin: 14.3 g/dL (ref 12.0–15.0)
MCH: 31.4 pg (ref 26.0–34.0)
MCHC: 36.1 g/dL — ABNORMAL HIGH (ref 30.0–36.0)
MCV: 86.8 fL (ref 80.0–100.0)
Platelets: 281 10*3/uL (ref 150–400)
RBC: 4.56 MIL/uL (ref 3.87–5.11)
RDW: 11.9 % (ref 11.5–15.5)
WBC: 7.5 10*3/uL (ref 4.0–10.5)
nRBC: 0 % (ref 0.0–0.2)

## 2021-09-10 LAB — BASIC METABOLIC PANEL
Anion gap: 11 (ref 5–15)
BUN: 14 mg/dL (ref 8–23)
CO2: 26 mmol/L (ref 22–32)
Calcium: 9.6 mg/dL (ref 8.9–10.3)
Chloride: 99 mmol/L (ref 98–111)
Creatinine, Ser: 0.88 mg/dL (ref 0.44–1.00)
GFR, Estimated: >60 mL/min (ref >60)
Glucose, Bld: 139 mg/dL — ABNORMAL HIGH (ref 70–99)
Potassium: 4.1 mmol/L (ref 3.5–5.1)
Sodium: 136 mmol/L (ref 135–145)

## 2021-09-10 LAB — RESP PANEL BY RT-PCR (FLU A&B, COVID) ARPGX2
Influenza A by PCR: NEGATIVE
Influenza B by PCR: NEGATIVE
SARS Coronavirus 2 by RT PCR: NEGATIVE

## 2021-09-10 MED ORDER — PREDNISONE 10 MG PO TABS: 40.0000 mg | ORAL_TABLET | Freq: Every day | ORAL | 0 refills | Status: AC

## 2021-09-10 NOTE — Telephone Encounter (Signed)
Patient called in stating for the past week she has had an upper respiratory infection treating with OTC medications. Last night around 5pm she converted to afib and continues in afib.  HRs 105-165 BP 99/62-140/78. Pt feels very weak. Discussed with Adline Peals PA will try an extra dose of flecainide '50mg'$  now if this does not convert her and continues to feel poorly recommend proceeding to [facility] ER for further assessment and treatment. Pt verbalized agreement.  ?

## 2021-09-10 NOTE — ED Provider Triage Note (Signed)
Emergency Medicine Provider Triage Evaluation Note ? ?[name] , a 71 y.o. female  was evaluated in triage.  Pt complains of sent by a fib clinic.  She reports that about 4pm yesterday she felt her self go into afib.  She reports that she has had a cough for a week.  She took an extra dose of flecainide today talking with cardiology and failing to convert was told to come here.  ? ?She has been compliant with her eliquis.  ? ?Physical Exam  ?BP 126/67 (BP Location: Left Arm)   Pulse (!) 139   Temp 98.5 ?F (36.9 ?C) (Oral)   Resp 20   Ht '5\' 4"'$  (1.626 m)   Wt 90.7 kg   SpO2 97%   BMI 34.33 kg/m?  ?Gen:   Awake, no distress   ?Resp:  Normal effort  ?MSK:   Moves extremities without difficulty  ?Other:  Occasional cough, rapid hr ? ?Medical Decision Making  ?Medically screening exam initiated at 3:38 PM.  Appropriate orders placed.  [name] was informed that the remainder of the evaluation will be completed by another provider, this initial triage assessment does not replace that evaluation, and the importance of remaining in the ED until their evaluation is complete. ? ?Patient with cough,EKG shows elevated HR.  ?  ?Lorin Glass, PA-C ?09/10/21 1541 ? ?

## 2021-09-10 NOTE — ED Provider Notes (Signed)
?[facility] ?Provider Note ? ? ?CSN: [id number] ?Arrival date & time: 09/10/21  1451 ? ?  ? ?History ? ?Chief Complaint  ?Patient presents with  ? Atrial Fibrillation  ? ? ?[name] is a 71 y.o. female. ? ?Patient here with A-fib with RVR.  This is recurrent.  She has taken additional doses of her flecainide and diltiazem and while she was in the waiting room she thinks that she converted back as her heart rate now is in the 70s.  She is not having any shortness of breath or chest pain.  She has had viral symptoms with a cough last several days. ? ? ?Atrial Fibrillation ?This is a recurrent problem. The current episode started yesterday. The problem has been resolved. Pertinent negatives include no chest pain, no abdominal pain, no headaches and no shortness of breath. Nothing aggravates the symptoms. Nothing relieves the symptoms. The treatment provided no relief.  ? ?  ? ?Home Medications ?Prior to Admission medications   ?Medication Sig Start Date End Date Taking? Authorizing Provider  ?predniSONE (DELTASONE) 10 MG tablet Take 4 tablets (40 mg total) by mouth daily for 3 days. 09/10/21 09/13/21 Yes Christpoher Sievers, DO  ?acetaminophen (TYLENOL) 650 MG CR tablet Take 650 mg by mouth in the morning and at bedtime.    [provider]  ?B Complex Vitamins (B COMPLEX 100 PO) Take 1 tablet by mouth every morning.     [provider]  ?Calcium Carbonate-Vitamin D (CALCIUM 600+D PO) Take 1 tablet by mouth in the morning and at bedtime.     [provider]  ?carboxymethylcellulose (REFRESH PLUS) 0.5 % SOLN Place 1 drop into both eyes 3 (three) times daily as needed (dry eyes).     [provider]  ?cetirizine (ZYRTEC) 10 MG tablet Take 10 mg by mouth at bedtime.     [provider]  ?Cholecalciferol (VITAMIN D) 125 MCG (5000 UT) CAPS Take 5,000 Units by mouth every morning.     [provider]  ?clindamycin (CLEOCIN T) 1 % lotion  Apply 1 application topically daily as needed (eczema).  06/07/19   [provider]  ?Coenzyme Q-10 100 MG capsule Take 100 mg by mouth every morning.     [provider]  ?diclofenac sodium (VOLTAREN) 1 % GEL Apply 4 g topically 4 (four) times daily as needed (joint pain).     [provider]  ?dicyclomine (BENTYL) 20 MG tablet Take 1 tablet (20 mg total) by mouth 2 (two) times daily. ?Patient taking differently: Take 20 mg by mouth 2 (two) times daily. As needed 08/23/21   Vladimir Crofts, PA-C  ?diltiazem (CARDIZEM CD) 240 MG 24 hr capsule TAKE 1 CAPSULE BY MOUTH EVERY DAY 11/11/20   [name], MD  ?diltiazem (CARDIZEM) 30 MG tablet Take one tablet by mouth every 4 hours as needed for heart rate greater than 100, top number blood pressure needs to be above 100 04/14/21   Fenton, Clint R, PA  ?docusate sodium (COLACE) 250 MG capsule Take 250 mg by mouth at bedtime.    [provider]  ?ELIQUIS 5 MG TABS tablet TAKE 1 TABLET BY MOUTH  TWICE DAILY 07/19/21   [name], MD  ?Flaxseed, Linseed, (FLAXSEED OIL) 1000 MG CAPS Take 1,000 mg by mouth every other day. Take 1 capsule every other day.    [provider]  ?flecainide (TAMBOCOR) 50 MG tablet TAKE 1 TABLET BY MOUTH  TWICE DAILY 08/03/21   [name], MD  ?fluocinonide (LIDEX) 0.05 % external solution Apply 1 application topically daily as needed (eczema on scalp).  06/07/19   [provider]  ?gabapentin (NEURONTIN) 300 MG capsule TAKE 1 CAPSULE BY MOUTH 3  TIMES DAILY 04/12/21   Kirsteins, Luanna Salk, MD  ?GRAPE SEED EXTRACT PO Take 100 mg by mouth 2 (two) times daily.     [provider]  ?levothyroxine (SYNTHROID) 50 MCG tablet TAKE 1 TABLET BY MOUTH  DAILY 12/17/20   Copland, Frederico Hamman, MD  ?loratadine (CLARITIN) 10 MG tablet Take 10 mg by mouth every morning.     [provider]  ?Magnesium Bisglycinate (MAG GLYCINATE PO) Take 400 mg by mouth at bedtime.     [provider]  ?methocarbamol (ROBAXIN) 500 MG tablet TAKE 1 TABLET BY MOUTH EVERY 8 HOURS AS NEEDED FOR MUSCLE SPASMS. 04/09/21   Bayard Hugger, NP  ?mirtazapine (REMERON) 15 MG tablet TAKE 1 TABLET BY MOUTH AT  BEDTIME ?Patient taking differently: Take 1/2 tab QHS 03/01/21   Copland, Frederico Hamman, MD  ?morphine (MS CONTIN) 15 MG 12 hr tablet Take 1 tablet (15 mg total) by mouth 3 (three) times daily. Do Not Fill Before 09/11/2021 08/23/21   Bayard Hugger, NP  ?neomycin-polymyxin b-dexamethasone (MAXITROL) 3.5-10000-0.1 OINT SMARTSIG:Sparingly In Eye(s) 01/27/21   [provider]  ?oxybutynin (DITROPAN-XL) 10 MG 24 hr tablet Take 10 mg by mouth at bedtime.    [provider]  ?oxymetazoline (AFRIN) 0.05 % nasal spray Place 1 spray into both nostrils 2 (two) times daily as needed for congestion.    [provider]  ?PEG-KCl-NaCl-NaSulf-Na Asc-C (PLENVU) 140 g SOLR Take 140 g by mouth as directed. Manufacturer's coupon Universal coupon code:BIN: P2366821; GROUP: [id number]; PCN: [id number]; ID: [id number]; PAY NO MORE $50 08/23/21   Vicie Mutters R, PA-C  ?polyethylene glycol powder (MIRALAX) 17 GM/SCOOP powder Take 1 Container by mouth at bedtime.    [provider]  ?RABEprazole (ACIPHEX) 20 MG tablet TAKE 1 TABLET BY MOUTH EVERY DAY 01/11/21   Copland, Frederico Hamman, MD  ?senna (SENOKOT) 8.6 MG TABS tablet Take 1 tablet by mouth at bedtime.    [provider]  ?tretinoin (RETIN-A) 0.05 % cream Apply 1 application topically daily as needed (acne).  06/07/19   [provider]  ?vitamin C (ASCORBIC ACID) 500 MG tablet Take 500 mg by mouth daily.    [provider]  ?Wheat Dextrin (BENEFIBER) CHEW Chew by mouth 2 (two) times daily. Takes 2 gummies 2 times a day.    [provider]  ?   ? ?Allergies    ?Cyclobenzaprine hcl, Rizatriptan benzoate, Sumatriptan, Tizanidine, Aleve [naproxen sodium], Clarithromycin, Corn-containing products, Doxycycline, Duloxetine, Egg  [eggs or egg-derived products], Keflex [cephalexin], Metronidazole, Oxycodone hcl, Oxycontin [oxycodone hcl], Penicillins, Rosuvastatin, Venlafaxine, Voltaren [diclofenac sodium], Septra [sulfamethoxazole-trimethoprim], and Tramadol   ? ?Review of Systems   ?Review of Systems  ?Respiratory:  Negative for shortness of breath.   ?Cardiovascular:  Negative for chest pain.  ?Gastrointestinal:  Negative for abdominal pain.  ?Neurological:  Negative for headaches.  ? ?Physical Exam ?Updated Vital Signs ?BP (!) 115/56   Pulse 87   Temp 98.5 ?F (36.9 ?C) (Oral)   Resp 16   Ht '5\' 4"'$  (1.626 m)   Wt 90.7 kg   SpO2 100%   BMI 34.33 kg/m?  ?Physical Exam ?Vitals and nursing note reviewed.  ?Constitutional:   ?   General: She is  not in acute distress. ?   Appearance: She is well-developed. She is not ill-appearing.  ?HENT:  ?   Head: Normocephalic and atraumatic.  ?   Nose: Nose normal.  ?   Mouth/Throat:  ?   Mouth: Mucous membranes are moist.  ?Eyes:  ?   Extraocular Movements: Extraocular movements intact.  ?   Conjunctiva/sclera: Conjunctivae normal.  ?   Pupils: Pupils are equal, round, and reactive to light.  ?Cardiovascular:  ?   Rate and Rhythm: Normal rate and regular rhythm.  ?   Pulses: Normal pulses.  ?   Heart sounds: Normal heart sounds. No murmur heard. ?Pulmonary:  ?   Effort: Pulmonary effort is normal. No respiratory distress.  ?   Breath sounds: Normal breath sounds.  ?Abdominal:  ?   Palpations: Abdomen is soft.  ?   Tenderness: There is no abdominal tenderness.  ?Musculoskeletal:  ?   Cervical back: Neck supple.  ?Skin: ?   General: Skin is warm and dry.  ?   Capillary Refill: Capillary refill takes less than 2 seconds.  ?Neurological:  ?   General: No focal deficit present.  ?   Mental Status: She is alert.  ? ? ?ED Results / Procedures / Treatments   ?Labs ?(all labs ordered are listed, but only abnormal results are displayed) ?Labs Reviewed  ?BASIC METABOLIC PANEL - Abnormal; Notable for the  following components:  ?    Result Value  ? Glucose, Bld 139 (*)   ? All other components within normal limits  ?CBC - Abnormal; Notable for the following components:  ? MCHC 36.1 (*)   ? All other components within nor

## 2021-09-10 NOTE — Discharge Instructions (Signed)
Your chest x-ray shows no evidence of pneumonia.  Given your multiple drug allergies we will hold off antibiotics at this time as I suspect steroids will be very helpful alone.  As this is likely inflammation rather than infection.  Your COVID and flu test are negative. ?

## 2021-09-10 NOTE — ED Triage Notes (Signed)
Pt reports she was sent by the AFIB clinic today due to HR 120s-140s. Denies CP but reports SOB but states she feels like she has a URI with c/o sore throat, cough, congestion, low grade fever; unrelieved with Mucinex. ?

## 2021-09-13 ENCOUNTER — Telehealth: Payer: Self-pay

## 2021-09-13 ENCOUNTER — Telehealth: Payer: Medicare Other

## 2021-09-13 ENCOUNTER — Telehealth (HOSPITAL_COMMUNITY): Payer: Self-pay

## 2021-09-13 NOTE — Progress Notes (Signed)
? ? ?Chronic Care Management ?Pharmacy Assistant  ? ?Name: ABREA HENLE  MRN: [id number] DOB: January 08, 1951 ? ?Reason for Encounter: CCM Counsellor) ?  ?Medications: ?Outpatient Encounter Medications as of 09/13/2021  ?Medication Sig  ? acetaminophen (TYLENOL) 650 MG CR tablet Take 650 mg by mouth in the morning and at bedtime.  ? B Complex Vitamins (B COMPLEX 100 PO) Take 1 tablet by mouth every morning.   ? Calcium Carbonate-Vitamin D (CALCIUM 600+D PO) Take 1 tablet by mouth in the morning and at bedtime.   ? carboxymethylcellulose (REFRESH PLUS) 0.5 % SOLN Place 1 drop into both eyes 3 (three) times daily as needed (dry eyes).   ? cetirizine (ZYRTEC) 10 MG tablet Take 10 mg by mouth at bedtime.   ? Cholecalciferol (VITAMIN D) 125 MCG (5000 UT) CAPS Take 5,000 Units by mouth every morning.   ? clindamycin (CLEOCIN T) 1 % lotion Apply 1 application topically daily as needed (eczema).   ? Coenzyme Q-10 100 MG capsule Take 100 mg by mouth every morning.   ? diclofenac sodium (VOLTAREN) 1 % GEL Apply 4 g topically 4 (four) times daily as needed (joint pain).   ? dicyclomine (BENTYL) 20 MG tablet Take 1 tablet (20 mg total) by mouth 2 (two) times daily. (Patient taking differently: Take 20 mg by mouth 2 (two) times daily. As needed)  ? diltiazem (CARDIZEM CD) 240 MG 24 hr capsule TAKE 1 CAPSULE BY MOUTH EVERY DAY  ? diltiazem (CARDIZEM) 30 MG tablet Take one tablet by mouth every 4 hours as needed for heart rate greater than 100, top number blood pressure needs to be above 100  ? docusate sodium (COLACE) 250 MG capsule Take 250 mg by mouth at bedtime.  ? ELIQUIS 5 MG TABS tablet TAKE 1 TABLET BY MOUTH  TWICE DAILY  ? Flaxseed, Linseed, (FLAXSEED OIL) 1000 MG CAPS Take 1,000 mg by mouth every other day. Take 1 capsule every other day.  ? flecainide (TAMBOCOR) 50 MG tablet TAKE 1 TABLET BY MOUTH  TWICE DAILY  ? fluocinonide (LIDEX) 0.05 % external solution Apply 1 application topically daily as needed (eczema on  scalp).   ? gabapentin (NEURONTIN) 300 MG capsule TAKE 1 CAPSULE BY MOUTH 3  TIMES DAILY  ? GRAPE SEED EXTRACT PO Take 100 mg by mouth 2 (two) times daily.   ? levothyroxine (SYNTHROID) 50 MCG tablet TAKE 1 TABLET BY MOUTH  DAILY  ? loratadine (CLARITIN) 10 MG tablet Take 10 mg by mouth every morning.   ? Magnesium Bisglycinate (MAG GLYCINATE PO) Take 400 mg by mouth at bedtime.   ? methocarbamol (ROBAXIN) 500 MG tablet TAKE 1 TABLET BY MOUTH EVERY 8 HOURS AS NEEDED FOR MUSCLE SPASMS.  ? mirtazapine (REMERON) 15 MG tablet TAKE 1 TABLET BY MOUTH AT  BEDTIME (Patient taking differently: Take 1/2 tab QHS)  ? morphine (MS CONTIN) 15 MG 12 hr tablet Take 1 tablet (15 mg total) by mouth 3 (three) times daily. Do Not Fill Before 09/11/2021  ? neomycin-polymyxin b-dexamethasone (MAXITROL) 3.5-10000-0.1 OINT SMARTSIG:Sparingly In Eye(s)  ? oxybutynin (DITROPAN-XL) 10 MG 24 hr tablet Take 10 mg by mouth at bedtime.  ? oxymetazoline (AFRIN) 0.05 % nasal spray Place 1 spray into both nostrils 2 (two) times daily as needed for congestion.  ? PEG-KCl-NaCl-NaSulf-Na Asc-C (PLENVU) 140 g SOLR Take 140 g by mouth as directed. Manufacturer's coupon Universal coupon code:BIN: P2366821; GROUP: [id number]; PCN: [id number]; ID: [id number]; PAY NO MORE $50  ? polyethylene glycol  powder (MIRALAX) 17 GM/SCOOP powder Take 1 Container by mouth at bedtime.  ? predniSONE (DELTASONE) 10 MG tablet Take 4 tablets (40 mg total) by mouth daily for 3 days.  ? RABEprazole (ACIPHEX) 20 MG tablet TAKE 1 TABLET BY MOUTH EVERY DAY  ? senna (SENOKOT) 8.6 MG TABS tablet Take 1 tablet by mouth at bedtime.  ? tretinoin (RETIN-A) 0.05 % cream Apply 1 application topically daily as needed (acne).   ? vitamin C (ASCORBIC ACID) 500 MG tablet Take 500 mg by mouth daily.  ? Wheat Dextrin (BENEFIBER) CHEW Chew by mouth 2 (two) times daily. Takes 2 gummies 2 times a day.  ? ?No facility-administered encounter medications on file as of 09/13/2021.  ? ?[name] was  contacted to remind of upcoming telephone visit with Charlene Brooke on 09/16/2021 at 3:45. Patient was reminded to have any blood glucose and blood pressure readings available for review at appointment.  ? ?Patient confirmed appointment. ? ?Are you having any problems with your medications? No  ? ?Do you have any concerns you like to discuss with the pharmacist? No ? ?Star Rating Drugs: ?Medication:  Last Fill: Day Supply ?Flecainide 50 mg 08/13/2021 90 ? ?Charlene Brooke, CPP notified ? ?Marijean Niemann, RMA ?Clinical Pharmacy Assistant ?[phone] ? ? ? ?

## 2021-09-13 NOTE — Telephone Encounter (Signed)
Contacted patient to see how she is feeling since she was discharged from the ED. She states she still has a cough, chest discomfort however no stabbing pain just aching due to excessive coughing. She was told to reach out to her pcp if she continues to feel bad. Her appointment for the A-fib clinic has been rescheduled to a sooner appointment. I cancelled her appointment for 5/2 and rescheduled her for 4/5 @ 2:00pm with [facility]. Consulted with patient and she verbalized understanding.   ?

## 2021-09-16 ENCOUNTER — Ambulatory Visit (INDEPENDENT_AMBULATORY_CARE_PROVIDER_SITE_OTHER): Payer: Medicare Other | Admitting: Pharmacist

## 2021-09-16 ENCOUNTER — Other Ambulatory Visit: Payer: Self-pay

## 2021-09-16 DIAGNOSIS — I10 Essential (primary) hypertension: Secondary | ICD-10-CM

## 2021-09-16 DIAGNOSIS — E119 Type 2 diabetes mellitus without complications: Secondary | ICD-10-CM

## 2021-09-16 DIAGNOSIS — I480 Paroxysmal atrial fibrillation: Secondary | ICD-10-CM

## 2021-09-16 DIAGNOSIS — E785 Hyperlipidemia, unspecified: Secondary | ICD-10-CM

## 2021-09-16 NOTE — Patient Instructions (Signed)
Visit Information ? ?Phone number for Pharmacist: [phone] ? ? Goals Addressed   ?None ?  ? ? ?Care Plan : [place]  ?Updates made by Charlton Haws, RPH since 09/16/2021 12:00 AM  ?  ? ?Problem: Hypertension, Hyperlipidemia, Diabetes, and Atrial Fibrillation, Chronic pain, constipation   ?Priority: High  ?  ? ?Long-Range Goal: Disease mgmt   ?Start Date: 09/14/2020  ?Expected End Date: 09/17/2022  ?This Visit's Progress: On track  ?Priority: High  ?Note:   ?Current Barriers:  ?None identified ? ?Pharmacist Clinical Goal(s):  ?Patient will contact provider office for questions/concerns as evidenced notation of same in electronic health record through collaboration with PharmD and provider.  ? ?Interventions: ?1:1 collaboration with Owens Loffler, MD regarding development and update of comprehensive plan of care as evidenced by provider attestation and co-signature ?Inter-disciplinary care team collaboration (see longitudinal plan of care) ?Comprehensive medication review performed; medication list updated in electronic medical record ? ?Atrial Fibrillation (Goal: prevent stroke and major bleeding) ?-Controlled - pt was in ED last week for Afib w/ RVR which converted to NSR with extra doses of flecainide ?-Home BP and HR readings: none, clinic BP stable/controlled ?-Current treatment: ?Diltiazem 240 mg daily - Appropriate, Effective, Safe, Accessible ?Flecainide 50 mg BID (may take 2 BID x 2 days for AFIB episode) - Appropriate, Effective, Safe, Accessible ?Eliquis 5 mg BID - Appropriate, Effective, Safe, Accessible ?-Previously tried/failed medications: metoprolol ?-Counseled on increased risk of stroke due to Afib and benefits of anticoagulation for stroke prevention; ?importance of adherence to anticoagulant exactly as prescribed; ?-Recommended to continue current medication ? ?Hyperlipidemia: (LDL goal < 100) ?-Not ideally controlled - LDL 132 (07/2021), worsened from 108; ASCVD risk 15.8%;  she has been hesitant to try other statins before due to polypharmacy concerns and hx of leg pain with Crestor ?-Previously Discussed FH - Father passed at age 67 after multiple heart attacks. He smoked for 50+, poor diet, obese, etc. Mother MI in her 20s.  ?-Current treatment: ?None ?-Medications previously tried: rosuvastatin (leg pain) ?-We discussed lowering her risk with weight loss, DM control, exercise ?-Educated on Cholesterol goals; Benefits of statin for ASCVD risk reduction; ?-Recommended to continue current medication; ? ?Diabetes (A1c goal <7%) ?-Diet-Controlled - A1c 6.6% (07/2021) improved ?-She has a tendency to have low sugars even without medication ?-Medications previously tried: metformin - BG dropped too low  ?-Current meal patterns: tries to avoid snacking, eats vegetables and fruits, water and black coffee  ?-Current exercise: minimal, hopes to get out and walk after foot surgery ?-Educated on A1c and blood sugar goals; ?-Counseled on diet and exercise extensively ? ?Chronic pain (Goal: manage pain) ?-Controlled - per pt report ?-Follows with pain mgmt (PM&R) ?-Current treatment  ?Morphine ER 15 mg TID - Appropriate, Effective, Safe, Accessible ?Tylenol 650 mg BID- Appropriate, Effective, Safe, Accessible ?Voltaren gel - Appropriate, Effective, Safe, Accessible ?-Medications previously tried: n/a  ?-Recommended to continue current medication ? ?GI (Goal: manage symptoms) ?-Controlled - improvement in constipation with addition of Miralax, Benefiber. Pt stopped taking docusate. ?-Hx UC, OIC ?-Current treatment  ?Dicyclomine 20 mg BID prn - Appropriate, Effective, Safe, Accessible ?Docusate 250 mg HS - stopped  ?Miralax - Appropriate, Effective, Safe, Accessible ?Senna 8.6 mg - Appropriate, Effective, Safe, Accessible ?Benefiber chew - Appropriate, Effective, Safe, Accessible ?-Medications previously tried: Amitiza, Movantik  ?-Recommended to continue current medication ? ?Health  Maintenance ?-Vaccine gaps: Flu, Shingrix, Covid booster ?-Current therapy:  ?B Complex ?Calcium-Vitamin D ?Vitamin D 5000 IU ?  Magnesium glycinate ?Vitamin C 500 mg ?Cetirizine 10 mg HS ?Loratadine 10 mg AM ?Afrin nasal spray PRN ?Voltaren gel ?Refresh eye drops ?-Patient is satisfied with current therapy and denies issues ?-Recommended to continue current medication ? ? ?Patient Goals/Self-Care Activities ?Patient will:  ?- take medications as prescribed as evidenced by patient report and record review ?focus on medication adherence by routine ?engage in dietary modifications by limit cholesterol ? ?  ?  ? ?Patient verbalizes understanding of instructions and care plan provided today and agrees to view in [place]. Active MyChart status confirmed with patient.   ?Telephone follow up appointment with pharmacy team member scheduled for: 6 months ? ?Charlene Brooke, PharmD, BCACP ?Clinical Pharmacist ?[facility] ?[phone] ?  ?

## 2021-09-16 NOTE — Progress Notes (Signed)
? ?Chronic Care Management ?Pharmacy Note ? ?09/16/2021 ?Name:  [name] MRN:  [id number] DOB:  [birth year] ? ?Summary: CCM F/U visit ?-Pt reports compliance with medications as prescribed ?-LDL 132 (07/2021), worsened from 108. ASCVD risk 15.8% with strong family history of CAD (mother and father). She has failed rosuvastatin due to myalgias, pt is hesitant to try alternatives. Statin therapy is recommended based on diabetes diagnosis, pt defers at this time to work on lifestyle changes. ? ?Recommendations/Changes made from today's visit: ?-Consider statin or alternative in future for goal LDL < 100 if lifestyle changes do not achieve goal ? ?Plan: ?-[place] will call patient 3 months for cholesterol/lifestyle update ?-Pharmacist follow up televisit scheduled for 6 months ?-09/29/21 AFIB clinic f/u ? ? ? ?Subjective: ?[name] is an 71 y.o. year old female who is a primary patient of Copland, Frederico Hamman, MD.  The CCM team was consulted for assistance with disease management and care coordination needs.   ? ?Engaged with patient by telephone for follow up visit in response to provider referral for pharmacy case management and/or care coordination services.  ? ?Consent to Services:  ?The patient was given information about Chronic Care Management services, agreed to services, and gave verbal consent prior to initiation of services.  Please see initial visit note for detailed documentation.  ? ?Patient Care Team: ?Owens Loffler, MD as PCP - General ?[name], MD as PCP - Cardiology (Cardiology) ?Kirsteins, Luanna Salk, MD as Consulting Physician (Physical Medicine and Rehabilitation) ?Armbruster, Carlota Raspberry, MD as Consulting Physician (Gastroenterology) ?[name], MD as Consulting Physician (Cardiology) ?Chesley Mires, MD as Consulting Physician (Pulmonary Disease) ?Alanah Sakuma, Cleaster Cor[place], [facility] as Pharmacist (Pharmacist) ? ?Recent office visits: ?09/06/21 Dr Lorelei Pont OV: annual exam - stable  conditions. No changes. RTC 6-12 months. ? ?Recent consult visits: ?08/23/21 NP [name] (PM&R): f/u chronic back pain. Refilled Morphine. ? ?08/23/21 PA Vicie Mutters (GI): hematochezia, UC. Possibly ischemic, scheduled colonoscopy. Add Miralax/Fiber to senokot. Failed Amitiza, Movantik. ? ?07/21/21 Dr Ellyn Hack (cardiology): f/u Afib. No med changes. Low threshold to start statin. ? ?Hospital visits: ?Medication Reconciliation was completed by comparing discharge summary, patient?s EMR and Pharmacy list, and upon discussion with patient. ? ?Admitted to the ED on 09/10/21 due to Afib w/ RVR. Discharge date was 09/10/21. Discharged from [facility].   ?-converted to NSR spontaneously ? ?New?Medications Started at [facility] Discharge:?? ?-started Prednisone 40 mg due to cough ? ?Medications that remain the same after Hospital Discharge:??  ?-All other medications will remain the same.   ? ?Admitted to the ED on 08/10/21 due to Colitis. Discharge date was 08/10/21. Discharged from [facility].   ? ?New?Medications Started at [facility] Discharge:?? ?-started Dicyclomine 20 mg BIDdue to colitis ? ?Medications that remain the same after Hospital Discharge:??  ?-All other medications will remain the same.   ? ? ?Objective: ? ?Lab Results  ?Component Value Date  ? CREATININE 0.88 09/10/2021  ? BUN 14 09/10/2021  ? GFR 76.11 08/23/2021  ? GFRNONAA >60 09/10/2021  ? GFRAA >60 08/29/2019  ? NA 136 09/10/2021  ? K 4.1 09/10/2021  ? CALCIUM 9.6 09/10/2021  ? CO2 26 09/10/2021  ? GLUCOSE 139 (H) 09/10/2021  ? ? ?Lab Results  ?Component Value Date/Time  ? HGBA1C 6.6 (H) 08/23/2021 11:29 AM  ? HGBA1C 7.0 (A) 12/21/2020 02:35 PM  ? HGBA1C 7.7 (H) 09/14/2020 02:37 PM  ? GFR 76.11 08/23/2021 11:17 AM  ? GFR 79.01 09/14/2020 02:37 PM  ?  MICROALBUR 2.6 (H) 08/23/2021 11:29 AM  ? MICROALBUR 5.5 (H) 03/12/2020 01:47 PM  ?  ?Last diabetic Eye exam:  ?Lab Results  ?Component Value Date/Time  ? HMDIABEYEEXA No Retinopathy 10/29/2020  12:00 AM  ?  ?Last diabetic Foot exam: No results found for: HMDIABFOOTEX  ? ?Lab Results  ?Component Value Date  ? CHOL 196 08/23/2021  ? HDL 45.80 08/23/2021  ? LDLCALC 108 (H) 03/12/2020  ? LDLDIRECT 132.0 08/23/2021  ? TRIG 233.0 (H) 08/23/2021  ? CHOLHDL 4 08/23/2021  ? ? ? ?  Latest Ref Rng & Units 08/23/2021  ? 11:29 AM 08/23/2021  ? 11:17 AM 08/10/2021  ? 10:46 AM  ?Hepatic Function  ?Total Protein 6.0 - 8.3 g/dL 7.9   8.0   8.2    ?Albumin 3.5 - 5.2 g/dL 4.7   4.8   4.8    ?AST 0 - 37 U/L 26   26   29     ?ALT 0 - 35 U/L 23   23   31     ?Alk Phosphatase 39 - 117 U/L 102   103   126    ?Total Bilirubin 0.2 - 1.2 mg/dL 0.4   0.4   0.5    ?Bilirubin, Direct 0.0 - 0.3 mg/dL 0.0      ? ? ?Lab Results  ?Component Value Date/Time  ? TSH 4.09 08/23/2021 11:29 AM  ? TSH 3.52 09/14/2020 02:37 PM  ? FREET4 0.89 08/23/2021 11:29 AM  ? FREET4 0.94 09/14/2020 02:37 PM  ? ? ? ?  Latest Ref Rng & Units 09/10/2021  ?  3:27 PM 08/23/2021  ? 11:17 AM 08/10/2021  ? 10:46 AM  ?CBC  ?WBC 4.0 - 10.5 K/uL 7.5   6.6   12.0    ?Hemoglobin 12.0 - 15.0 g/dL 14.3   13.9   14.6    ?Hematocrit 36.0 - 46.0 % 39.6   40.8   42.7    ?Platelets 150 - 400 K/uL 281   291.0   309    ? ? ?Lab Results  ?Component Value Date/Time  ? VD25OH 44.28 08/23/2021 11:29 AM  ? VD25OH 33.52 03/12/2020 01:25 PM  ? ? ?Clinical ASCVD: No  ?The 10-year ASCVD risk score (Arnett DK, et al., 2019) is: 15.8% ?  Values used to calculate the score: ?    Age: 71 years ?    Sex: Female ?    Is Non-Hispanic African American: No ?    Diabetic: Yes ?    Tobacco smoker: No ?    Systolic Blood Pressure: 315 mmHg ?    Is BP treated: Yes ?    HDL Cholesterol: 45.8 mg/dL ?    Total Cholesterol: 196 mg/dL   ? ? ?  08/23/2021  ?  2:14 PM 07/16/2021  ? 10:25 AM 07/12/2021  ?  1:20 PM  ?Depression screen PHQ 2/9  ?Decreased Interest 0 0 0  ?Down, Depressed, Hopeless 0 0 1  ?PHQ - 2 Score 0 0 1  ?  ? ?CHA2DS2/VAS Stroke Risk Points  Current as of 3 days ago  ?   5 >= 2 Points: High Risk  ?1 -  1.99 Points: Medium Risk  ?0 Points: Low Risk  ?  Last Change: N/A   ?  ? Points Metrics  ?0 Has Congestive Heart Failure:  No   ? Current as of 3 days ago  ?1 Has Vascular Disease:  Yes   ? Current as of 3 days ago  ?  1 Has Hypertension:  Yes   ? Current as of 3 days ago  ?1 Age:  20   ? Current as of 3 days ago  ?1 Has Diabetes:  Yes   ? Current as of 3 days ago  ?0 Had Stroke:  No  Had TIA:  No  Had Thromboembolism:  No   ? Current as of 3 days ago  ?1 Female:  Yes   ? Current as of 3 days ago  ? ? ?Social History  ? ?Tobacco Use  ?Smoking Status Never  ?Smokeless Tobacco Never  ? ?BP Readings from Last 3 Encounters:  ?09/10/21 (!) 108/54  ?09/06/21 122/62  ?08/23/21 123/68  ? ?Pulse Readings from Last 3 Encounters:  ?09/10/21 67  ?09/06/21 74  ?08/23/21 65  ? ?Wt Readings from Last 3 Encounters:  ?09/10/21 200 lb (90.7 kg)  ?09/06/21 203 lb 6 oz (92.3 kg)  ?08/23/21 205 lb (93 kg)  ? ?BMI Readings from Last 3 Encounters:  ?09/10/21 34.33 kg/m?  ?09/06/21 34.64 kg/m?  ?08/23/21 34.11 kg/m?  ? ? ?Assessment/Interventions: Review of patient past medical history, allergies, medications, health status, including review of consultants reports, laboratory and other test data, was performed as part of comprehensive evaluation and provision of chronic care management services.  ? ?SDOH:  (Social Determinants of Health) assessments and interventions performed: No - performed 05/2021 at [facility] ? ?SDOH Screenings  ? ?Alcohol Screen: Low Risk   ? Last Alcohol Screening Score (AUDIT): 1  ?Depression (PHQ2-9): Low Risk   ? PHQ-2 Score: 0  ?Financial Resource Strain: Low Risk   ? Difficulty of Paying Living Expenses: Not hard at all  ?Food Insecurity: No Food Insecurity  ? Worried About Charity fundraiser in the Last Year: Never true  ? Ran Out of Food in the Last Year: Never true  ?Housing: Low Risk   ? Last Housing Risk Score: 0  ?Physical Activity: Sufficiently Active  ? Days of Exercise per Week: 7 days  ? Minutes of Exercise  per Session: 30 min  ?Social Connections: Socially Integrated  ? Frequency of Communication with Friends and Family: Three times a week  ? Frequency of Social Gatherings with Friends and Family: More than t

## 2021-09-24 DIAGNOSIS — E785 Hyperlipidemia, unspecified: Secondary | ICD-10-CM | POA: Diagnosis not present

## 2021-09-24 DIAGNOSIS — E1159 Type 2 diabetes mellitus with other circulatory complications: Secondary | ICD-10-CM | POA: Diagnosis not present

## 2021-09-24 DIAGNOSIS — I4891 Unspecified atrial fibrillation: Secondary | ICD-10-CM | POA: Diagnosis not present

## 2021-09-27 ENCOUNTER — Other Ambulatory Visit: Payer: Self-pay | Admitting: Family Medicine

## 2021-09-27 ENCOUNTER — Ambulatory Visit: Payer: Medicare Other | Admitting: Cardiology

## 2021-09-29 ENCOUNTER — Encounter: Payer: Self-pay | Admitting: Gastroenterology

## 2021-09-29 ENCOUNTER — Encounter: Payer: Self-pay | Admitting: Cardiology

## 2021-09-29 ENCOUNTER — Ambulatory Visit (HOSPITAL_COMMUNITY)
Admission: RE | Admit: 2021-09-29 | Discharge: 2021-09-29 | Disposition: A | Discharge status: Home / Self Care | Payer: Medicare Other | Source: Ambulatory Visit | Attending: Physician Assistant | Admitting: Physician Assistant

## 2021-09-29 VITALS — BP 138/60 | HR 83 | Ht 64.0 in | Wt 199.4 lb

## 2021-09-29 DIAGNOSIS — Z6834 Body mass index (BMI) 34.0-34.9, adult: Secondary | ICD-10-CM | POA: Diagnosis not present

## 2021-09-29 DIAGNOSIS — I483 Typical atrial flutter: Secondary | ICD-10-CM | POA: Diagnosis not present

## 2021-09-29 DIAGNOSIS — I480 Paroxysmal atrial fibrillation: Secondary | ICD-10-CM | POA: Insufficient documentation

## 2021-09-29 DIAGNOSIS — Z7901 Long term (current) use of anticoagulants: Secondary | ICD-10-CM | POA: Insufficient documentation

## 2021-09-29 DIAGNOSIS — I10 Essential (primary) hypertension: Secondary | ICD-10-CM | POA: Insufficient documentation

## 2021-09-29 DIAGNOSIS — E119 Type 2 diabetes mellitus without complications: Secondary | ICD-10-CM | POA: Diagnosis not present

## 2021-09-29 DIAGNOSIS — E669 Obesity, unspecified: Secondary | ICD-10-CM | POA: Diagnosis not present

## 2021-09-29 DIAGNOSIS — Z9989 Dependence on other enabling machines and devices: Secondary | ICD-10-CM | POA: Insufficient documentation

## 2021-09-29 DIAGNOSIS — D6869 Other thrombophilia: Secondary | ICD-10-CM | POA: Diagnosis not present

## 2021-09-29 DIAGNOSIS — M797 Fibromyalgia: Secondary | ICD-10-CM | POA: Diagnosis not present

## 2021-09-29 DIAGNOSIS — G4733 Obstructive sleep apnea (adult) (pediatric): Secondary | ICD-10-CM | POA: Diagnosis not present

## 2021-09-29 DIAGNOSIS — Z79899 Other long term (current) drug therapy: Secondary | ICD-10-CM | POA: Diagnosis not present

## 2021-09-29 MED ORDER — FLECAINIDE ACETATE 50 MG PO TABS
100.0000 mg | ORAL_TABLET | Freq: Two times a day (BID) | ORAL | 3 refills | Status: DC
Start: 2021-09-29 — End: 2021-10-06

## 2021-09-29 MED ORDER — MIRTAZAPINE 15 MG PO TABS: ORAL_TABLET | ORAL | Status: DC

## 2021-09-29 MED ORDER — DICYCLOMINE HCL 20 MG PO TABS: 20.0000 mg | ORAL_TABLET | ORAL | Status: DC | PRN

## 2021-09-29 NOTE — Patient Instructions (Signed)
Increase flecainide to 100mg twice a day 

## 2021-09-29 NOTE — Progress Notes (Signed)
? ? ?Primary Care Physician: Owens Loffler, MD ?Primary Cardiologist: Dr Ellyn Hack ?Primary Electrophysiologist: none ?Referring Physician: Dr Ellyn Hack ? ? ?[name] is a 71 y.o. female with a history of fibromyalgia, DM, atrial flutter, HTN, OSA, paroxysmal atrial fibrillation who presents for follow up in the [facility]. The patient was initially diagnosed with atrial fibrillation 04/01/19 after presenting with one week of fatigue and weakness. She underwent TEE/DCCV on 04/03/19. Patient is on Eliquis for a CHADS2VASC score of 4. She reports that she has done well since then until the night of 08/28/19 when she noted heart racing symptoms with some lightheaded. When she got up the next morning and check her home ECG device she was in afib with RVR. There were no specific triggers that the patient could identify however, she did have a COVID vaccine two weeks prior. She is on BiPap for OSA. She denies any significant alcohol use. Patient presented for DCCV on 09/09/19 in [place] and the procedure was cancelled. She reports that she feels much better in SR with more energy, less SOB, no heart racing or dizziness.  ? ?On follow up today, patient went into afib on 09/09/21 with symptoms of rapid heart rate and weakness. She called the clinic and was instructed to take an extra dose of flecainide. She continued to feel poorly and she presented to the ED later that day. She converted to SR while in triage. Initial ECG did show rapid atrial flutter. Patient is also undergoing workup for GI bleeding with Dr Havery Moros. She presented to the ED 08/10/21 with bright red blood in the toilet. She has a colonoscopy scheduled for 10/11/21.  ? ?Today, she denies symptoms of palpitations, chest pain, shortness of breath, orthopnea, PND, lower extremity edema, presyncope, syncope, or neurologic sequela. The patient is tolerating medications without difficulties and is otherwise without complaint today.   ? ? ?Atrial Fibrillation Risk Factors: ? ?she does have symptoms or diagnosis of sleep apnea. ?she is compliant with BiPAP therapy. ?she does not have a history of rheumatic fever. ?she does not have a history of alcohol use. ?The patient does not have a history of early familial atrial fibrillation or other arrhythmias. ? ?she has a BMI of Body mass index is 34.23 kg/m?Marland KitchenMarland Kitchen ?Filed Weights  ? 09/29/21 1403  ?Weight: 90.4 kg  ? ? ? ? ?Family History  ?Problem Relation Age of Onset  ? Breast cancer Mother   ? Diabetes Mother   ? Heart disease Mother   ? Allergies Mother   ? Diabetes Father   ? Heart disease Father   ? Emphysema Father   ? Allergies Father   ? Breast cancer Paternal Aunt   ? Colon cancer Neg Hx   ? Esophageal cancer Neg Hx   ? ? ? ?Atrial Fibrillation Management history: ? ?Previous antiarrhythmic drugs: flecainide ?Previous cardioversions: 04/03/19 ?Previous ablations: none ?CHADS2VASC score: 4 ?Anticoagulation history: Eliquis ? ? ?Past Medical History:  ?Diagnosis Date  ? B12 deficiency   ? Complication of anesthesia   ? slow to wake up   ? Depression   ? Diabetes mellitus type 2, diet-controlled ([place]) 07/24/2018  ? Fibromyalgia   ? Generalized hyperhidrosis 04/17/2014  ? GERD (gastroesophageal reflux disease)   ? History of rheumatic fever as a child   ? Hyperlipemia   ? Hypertension   ? denies htn on 05/09/16  ? HYPOTHYROIDISM 05/29/2007  ? Irritable bowel syndrome   ? Migraines   ? hx of   ?  OSA (obstructive sleep apnea) 06/26/2019  ? Senile calcific aortic valve sclerosis   ? Aortic Sclerosis-no stenosis  ? ?Past Surgical History:  ?Procedure Laterality Date  ? 16 HOUR [place] STUDY N/A 01/04/2016  ? Procedure: [place] STUDY;  Surgeon: Manus Gunning, MD;  Location: [facility] ENDOSCOPY;  Service: Gastroenterology;  Laterality: N/A;  ? ABDOMINAL HYSTERECTOMY  1988  ? ADENOIDECTOMY    ? ANTERIOR CERVICAL DECOMP/DISCECTOMY FUSION  2009, 1993  ? first procedure in ~ 1993/Dr Botero  ? APPENDECTOMY  1988  ?  BACK SURGERY    ? CARDIOVERSION N/A 04/03/2019  ? Procedure: CARDIOVERSION;  Surgeon: Dorothy Spark, MD;  Location[facility] ENDOSCOPY;  Service: Cardiovascular;  Laterality: N/A;  ? CHOLECYSTECTOMY  2005  ? ESOPHAGEAL MANOMETRY N/A 01/04/2016  ? Procedure: ESOPHAGEAL MANOMETRY (EM) 24 HR PH;  Surgeon: Manus Gunning, MD;  Location: [facility] ENDOSCOPY;  Service: Gastroenterology;  Laterality: N/A;  ? ESOPHAGOGASTRODUODENOSCOPY (EGD) WITH PROPOFOL N/A 05/17/2016  ? Procedure: ESOPHAGOGASTRODUODENOSCOPY (EGD) WITH PROPOFOL;  Surgeon: Manus Gunning, MD;  Location: [facility] ENDOSCOPY;  Service: Gastroenterology;  Laterality: N/A;  ? NASAL SINUS SURGERY  2006  ? SAVORY DILATION N/A 05/17/2016  ? Procedure: SAVORY DILATION;  Surgeon: Manus Gunning, MD;  Location: [facility] ENDOSCOPY;  Service: Gastroenterology;  Laterality: N/A;  ? TEE WITHOUT CARDIOVERSION N/A 04/03/2019  ? Procedure: TRANSESOPHAGEAL ECHOCARDIOGRAM (TEE)-with DCCV;  Surgeon: Dorothy Spark, MD;  Location: [facility] ENDOSCOPY;; EF 55 to 60%.  No LVH.  Normal RV.  Moderate LA dilation.  No thrombus.  Mild RA dilation.  Mild to moderate TR.  Mild aortic valve sclerosis, no stenosis.  Moderately elevated PA pressures (47 mmHg)  ? TONSILLECTOMY AND ADENOIDECTOMY    ? TRANSTHORACIC ECHOCARDIOGRAM  2009  ? Normal LV size and function. EF 60-65%. No regional wall motion and amount is. Mild aortic sclerosis, no stenosis  ? TUBAL LIGATION  1979  ? ? ?Current Outpatient Medications  ?Medication Sig Dispense Refill  ? acetaminophen (TYLENOL) 650 MG CR tablet Take 650 mg by mouth in the morning and at bedtime.    ? B Complex Vitamins (B COMPLEX 100 PO) Take 1 tablet by mouth every morning.     ? Calcium Carbonate-Vitamin D (CALCIUM 600+D PO) Take 1 tablet by mouth in the morning and at bedtime.     ? carboxymethylcellulose (REFRESH PLUS) 0.5 % SOLN Place 1 drop into both eyes 3 (three) times daily as needed (dry eyes).     ? Carboxymethylcellulose Sodium (ARTIFICIAL  TEARS OP) Apply 1 drop to eye as needed.    ? cetirizine (ZYRTEC) 10 MG tablet Take 10 mg by mouth at bedtime.     ? Cholecalciferol (VITAMIN D) 125 MCG (5000 UT) CAPS Take 5,000 Units by mouth every morning.     ? clindamycin (CLEOCIN T) 1 % lotion Apply 1 application topically daily as needed (eczema).     ? Coenzyme Q-10 100 MG capsule Take 100 mg by mouth every morning.     ? diclofenac sodium (VOLTAREN) 1 % GEL Apply 4 g topically 4 (four) times daily as needed (joint pain).     ? diltiazem (CARDIZEM CD) 240 MG 24 hr capsule TAKE 1 CAPSULE BY MOUTH EVERY DAY 90 capsule 3  ? diltiazem (CARDIZEM) 30 MG tablet Take one tablet by mouth every 4 hours as needed for heart rate greater than 100, top number blood pressure needs to be above 100 45 tablet 1  ? ELIQUIS 5 MG TABS  tablet TAKE 1 TABLET BY MOUTH  TWICE DAILY 180 tablet 3  ? Flaxseed, Linseed, (FLAXSEED OIL) 1000 MG CAPS Take 1,000 mg by mouth every other day. Take 1 capsule every other day.    ? flecainide (TAMBOCOR) 50 MG tablet TAKE 1 TABLET BY MOUTH  TWICE DAILY 180 tablet 3  ? fluocinonide (LIDEX) 0.05 % external solution Apply 1 application topically daily as needed (eczema on scalp).     ? gabapentin (NEURONTIN) 300 MG capsule TAKE 1 CAPSULE BY MOUTH 3  TIMES DAILY 270 capsule 3  ? GRAPE SEED EXTRACT PO Take 100 mg by mouth every morning.    ? levothyroxine (SYNTHROID) 50 MCG tablet TAKE 1 TABLET BY MOUTH  DAILY 90 tablet 3  ? loratadine (CLARITIN) 10 MG tablet Take 10 mg by mouth every morning.     ? Magnesium Bisglycinate (MAG GLYCINATE PO) Take 500 mg by mouth at bedtime.    ? methocarbamol (ROBAXIN) 500 MG tablet TAKE 1 TABLET BY MOUTH EVERY 8 HOURS AS NEEDED FOR MUSCLE SPASMS. 270 tablet 3  ? morphine (MS CONTIN) 15 MG 12 hr tablet Take 1 tablet (15 mg total) by mouth 3 (three) times daily. Do Not Fill Before 09/11/2021 90 tablet 0  ? oxybutynin (DITROPAN-XL) 10 MG 24 hr tablet TAKE 1 TABLET BY MOUTH AT  BEDTIME 90 tablet 3  ? oxymetazoline (AFRIN)  0.05 % nasal spray Place 1 spray into both nostrils 2 (two) times daily as needed for congestion.    ? PEG-KCl-NaCl-NaSulf-Na Asc-C (PLENVU) 140 g SOLR Take 140 g by mouth as directed. Manufacturer's coupon

## 2021-10-03 ENCOUNTER — Encounter: Payer: Self-pay | Admitting: Certified Registered Nurse Anesthetist

## 2021-10-06 ENCOUNTER — Encounter: Payer: Medicare Other | Attending: Physical Medicine & Rehabilitation | Admitting: Registered Nurse

## 2021-10-06 ENCOUNTER — Ambulatory Visit (HOSPITAL_COMMUNITY)
Admission: RE | Admit: 2021-10-06 | Discharge: 2021-10-06 | Disposition: A | Discharge status: Home / Self Care | Payer: Medicare Other | Source: Ambulatory Visit | Attending: Physician Assistant | Admitting: Physician Assistant

## 2021-10-06 VITALS — BP 137/74 | HR 63 | Ht 64.0 in | Wt 202.0 lb

## 2021-10-06 DIAGNOSIS — Z79891 Long term (current) use of opiate analgesic: Secondary | ICD-10-CM | POA: Diagnosis not present

## 2021-10-06 DIAGNOSIS — G8929 Other chronic pain: Secondary | ICD-10-CM | POA: Insufficient documentation

## 2021-10-06 DIAGNOSIS — Z5181 Encounter for therapeutic drug level monitoring: Secondary | ICD-10-CM | POA: Insufficient documentation

## 2021-10-06 DIAGNOSIS — M62830 Muscle spasm of back: Secondary | ICD-10-CM | POA: Diagnosis not present

## 2021-10-06 DIAGNOSIS — M170 Bilateral primary osteoarthritis of knee: Secondary | ICD-10-CM | POA: Insufficient documentation

## 2021-10-06 DIAGNOSIS — M546 Pain in thoracic spine: Secondary | ICD-10-CM | POA: Diagnosis not present

## 2021-10-06 DIAGNOSIS — G894 Chronic pain syndrome: Secondary | ICD-10-CM | POA: Diagnosis not present

## 2021-10-06 DIAGNOSIS — M545 Low back pain, unspecified: Secondary | ICD-10-CM | POA: Insufficient documentation

## 2021-10-06 DIAGNOSIS — I480 Paroxysmal atrial fibrillation: Secondary | ICD-10-CM | POA: Insufficient documentation

## 2021-10-06 MED ORDER — MORPHINE SULFATE ER 15 MG PO TBCR: 15.0000 mg | EXTENDED_RELEASE_TABLET | Freq: Three times a day (TID) | ORAL | 0 refills | Status: DC

## 2021-10-06 MED ORDER — FLECAINIDE ACETATE 100 MG PO TABS: 100.0000 mg | ORAL_TABLET | Freq: Two times a day (BID) | ORAL | 3 refills | Status: DC

## 2021-10-06 NOTE — Progress Notes (Signed)
? ?Subjective:  ? ? Patient ID: [name], female    DOB: [birth year], 71 y.o.   MRN: [id number] ? ?HPI: [name] is a 71 y.o. female who returns for follow up appointment for chronic pain and medication refill. She states her  pain is located in her  mid- lower back and bilateral knee pain. She rates her pain 3. Her current exercise regime is walking and performing stretching exercises. ? ?[name] Morphine equivalent is 45.00  MME.   UDS ordered today.  ? ?[name] went to ED on 09/10/2021 for Atrial Fibrillation with RVR, note was reviewed. Cardiology following.  ?  ? ?Pain Inventory ?Average Pain 4 ?Pain Right Now 3 ?My pain is constant, burning, and aching ? ?In the last 24 hours, has pain interfered with the following? ?General activity 3 ?Relation with others 3 ?Enjoyment of life 3 ?What TIME of day is your pain at its worst? evening and night ?Sleep (in general) Good ? ?Pain is worse with: walking, bending, standing, and some activites ?Pain improves with: rest and heat/ice ?Relief from Meds: 6 ? ?Family History  ?Problem Relation Age of Onset  ? Breast cancer Mother   ? Diabetes Mother   ? Heart disease Mother   ? Allergies Mother   ? Diabetes Father   ? Heart disease Father   ? Emphysema Father   ? Allergies Father   ? Breast cancer Paternal Aunt   ? Colon cancer Neg Hx   ? Esophageal cancer Neg Hx   ? ?Social History  ? ?Socioeconomic History  ? Marital status: Married  ?  Spouse name: Not on file  ? Number of children: Not on file  ? Years of education: Not on file  ? Highest education level: Not on file  ?Occupational History  ? Occupation: Radiation protection practitioner  ? Occupation: retired  ?Tobacco Use  ? Smoking status: Never  ? Smokeless tobacco: Never  ?Vaping Use  ? Vaping Use: Never used  ?Substance and Sexual Activity  ? Alcohol use: Yes  ?  Alcohol/week: 1.0 standard drink  ?  Types: 1 Glasses of wine per week  ?  Comment: rare  ? Drug use: Never  ? Sexual activity: Not Currently  ?Other Topics  Concern  ? Not on file  ?Social History Narrative  ? Not on file  ? ?Social Determinants of Health  ? ?Financial Resource Strain: Low Risk   ? Difficulty of Paying Living Expenses: Not hard at all  ?Food Insecurity: No Food Insecurity  ? Worried About Charity fundraiser in the Last Year: Never true  ? Ran Out of Food in the Last Year: Never true  ?Transportation Needs: No Transportation Needs  ? Lack of Transportation (Medical): No  ? Lack of Transportation (Non-Medical): No  ?Physical Activity: Sufficiently Active  ? Days of Exercise per Week: 7 days  ? Minutes of Exercise per Session: 30 min  ?Stress: No Stress Concern Present  ? Feeling of Stress : Not at all  ?Social Connections: Socially Integrated  ? Frequency of Communication with Friends and Family: Three times a week  ? Frequency of Social Gatherings with Friends and Family: More than three times a week  ? Attends Religious Services: More than 4 times per year  ? Active Member of Clubs or Organizations: Yes  ? Attends Archivist Meetings: More than 4 times per year  ? Marital Status: Married  ? ?Past Surgical History:  ?Procedure Laterality Date  ?  [place] STUDY N/A 01/04/2016  ? Procedure: [place] STUDY;  Surgeon: Manus Gunning, MD;  Location: [facility] ENDOSCOPY;  Service: Gastroenterology;  Laterality: N/A;  ? ABDOMINAL HYSTERECTOMY  1988  ? ADENOIDECTOMY    ? ANTERIOR CERVICAL DECOMP/DISCECTOMY FUSION  2009, 1993  ? first procedure in ~ 1993/Dr Botero  ? APPENDECTOMY  1988  ? BACK SURGERY    ? CARDIOVERSION N/A 04/03/2019  ? Procedure: CARDIOVERSION;  Surgeon: Dorothy Spark, MD;  Location: [facility] ENDOSCOPY;  Service: Cardiovascular;  Laterality: N/A;  ? CHOLECYSTECTOMY  2005  ? ESOPHAGEAL MANOMETRY N/A 01/04/2016  ? Procedure: ESOPHAGEAL MANOMETRY (EM) 24 HR PH;  Surgeon: Manus Gunning, MD;  Location: [facility] ENDOSCOPY;  Service: Gastroenterology;  Laterality: N/A;  ? ESOPHAGOGASTRODUODENOSCOPY (EGD) WITH PROPOFOL N/A 05/17/2016  ?  Procedure: ESOPHAGOGASTRODUODENOSCOPY (EGD) WITH PROPOFOL;  Surgeon: Manus Gunning, MD;  Location: [facility] ENDOSCOPY;  Service: Gastroenterology;  Laterality: N/A;  ? NASAL SINUS SURGERY  2006  ? SAVORY DILATION N/A 05/17/2016  ? Procedure: SAVORY DILATION;  Surgeon: Manus Gunning, MD;  Location: [facility] END[facility];  Service: Gastroenterology;  Laterality: N/A;  ? TEE WITHOUT CARDIOVERSION N/A 04/03/2019  ? Procedure: TRANSESOPHAGEAL ECHOCARDIOGRAM (TEE)-with DCCV;  Surgeon: Dorothy Spark, MD;  Location: [facility] ENDOSCOPY;; EF 55 to 60%.  No LVH.  Normal RV.  Moderate LA dilation.  No thrombus.  Mild RA dilation.  Mild to moderate TR.  Mild aortic valve sclerosis, no stenosis.  Moderately elevated PA pressures (47 mmHg)  ? TONSILLECTOMY AND ADENOIDECTOMY    ? TRANSTHORACIC ECHOCARDIOGRAM  2009  ? Normal LV size and function. EF 60-65%. No regional wall motion and amount is. Mild aortic sclerosis, no stenosis  ? TUBAL LIGATION  1979  ? ?Past Surgical History:  ?Procedure Laterality Date  ? 93 HOUR [place] STUDY N/A 01/04/2016  ? Procedure: [place] STUDY;  Surgeon: Manus Gunning, MD;  Location: [facility] ENDOSCOPY;  Service: Gastroenterology;  Laterality: N/A;  ? ABDOMINAL HYSTERECTOMY  1988  ? ADENOIDECTOMY    ? ANTERIOR CERVICAL DECOMP/DISCECTOMY FUSION  2009, 1993  ? first procedure in ~ 1993/Dr Botero  ? APPENDECTOMY  1988  ? BACK SURGERY    ? CARDIOVERSION N/A 04/03/2019  ? Procedure: CARDIOVERSION;  Surgeon: Dorothy Spark, MD;  Location: [facility] ENDOSCOPY;  Service: Cardiovascular;  Laterality: N/A;  ? CHOLECYSTECTOMY  2005  ? ESOPHAGEAL MANOMETRY N/A 01/04/2016  ? Procedure: ESOPHAGEAL MANOMETRY (EM) 24 HR PH;  Surgeon: Manus Gunning, MD;  Location: [facility] ENDOSCOPY;  Service: Gastroenterology;  Laterality: N/A;  ? ESOPHAGOGASTRODUODENOSCOPY (EGD) WITH PROPOFOL N/A 05/17/2016  ? Procedure: ESOPHAGOGASTRODUODENOSCOPY (EGD) WITH PROPOFOL;  Surgeon: Manus Gunning, MD;  Location: [facility] ENDOSCOPY;   Service: Gastroenterology;  Laterality: N/A;  ? NASAL SINUS SURGERY  2006  ? SAVORY DILATION N/A 05/17/2016  ? Procedure: SAVORY DILATION;  Surgeon: Manus Gunning, MD;  Location: [facility] ENDOSCOPY;  Service: Gastroenterology;  Laterality: N/A;  ? TEE WITHOUT CARDIOVERSION N/A 04/03/2019  ? Procedure: TRANSESOPHAGEAL ECHOCARDIOGRAM (TEE)-with DCCV;  Surgeon: Dorothy Spark, MD;  Location: [facility] ENDOSCOPY;; EF 55 to 60%.  No LVH.  Normal RV.  Moderate LA dilation.  No thrombus.  Mild RA dilation.  Mild to moderate TR.  Mild aortic valve sclerosis, no stenosis.  Moderately elevated PA pressures (47 mmHg)  ? TONSILLECTOMY AND ADENOIDECTOMY    ? TRANSTHORACIC ECHOCARDIOGRAM  2009  ? Normal LV size and function. EF 60-65%. No regional wall motion and amount is. Mild aortic  sclerosis, no stenosis  ? TUBAL LIGATION  1979  ? ?Past Medical History:  ?Diagnosis Date  ? B12 deficiency   ? Complication of anesthesia   ? slow to wake up   ? Depression   ? Diabetes mellitus type 2, diet-controlled ([place]) 07/24/2018  ? Fibromyalgia   ? Generalized hyperhidrosis 04/17/2014  ? GERD (gastroesophageal reflux disease)   ? History of rheumatic fever as a child   ? Hyperlipemia   ? Hypertension   ? denies htn on 05/09/16  ? HYPOTHYROIDISM 05/29/2007  ? Irritable bowel syndrome   ? Migraines   ? hx of   ? OSA (obstructive sleep apnea) 06/26/2019  ? Senile calcific aortic valve sclerosis   ? Aortic Sclerosis-no stenosis  ? ?BP 137/74   Pulse 63   Ht '5\' 4"'$  (1.626 m)   Wt 202 lb (91.6 kg)   SpO2 94%   BMI 34.67 kg/m?  ? ?Opioid Risk Score:   ?Fall Risk Score:  `1 ? ?Depression screen PHQ 2/9 ? ? ?  08/23/2021  ?  2:14 PM 07/16/2021  ? 10:25 AM 07/12/2021  ?  1:20 PM 06/10/2021  ?  2:45 PM 05/31/2021  ? 11:20 AM 05/12/2021  ?  1:15 PM 04/29/2021  ? 10:55 AM  ?Depression screen PHQ 2/9  ?Decreased Interest 0 0 0 1 0 0 1  ?Down, Depressed, Hopeless 0 0 1 1 0 0 1  ?PHQ - 2 Score 0 0 1 2 0 0 2  ?  ? ?Review of Systems  ?Musculoskeletal:  Positive  for back pain.  ?     Left knee pain  ?All other systems reviewed and are negative. ? ?   ?Objective:  ? Physical Exam ?Vitals and nursing note reviewed.  ?Constitutional:   ?   Appearance: Normal appearance.  ?

## 2021-10-06 NOTE — Progress Notes (Signed)
Patient returns for ECG after increasing flecainide. ECG shows SR HR 63, PR 178, QRS 92, QTc 399. She has not had afib since 4/6. Tolerating the medication well. F/u with Dr Quentin Ore as scheduled.  ?

## 2021-10-08 ENCOUNTER — Encounter: Payer: Self-pay | Admitting: Gastroenterology

## 2021-10-08 ENCOUNTER — Encounter: Payer: Self-pay | Admitting: Registered Nurse

## 2021-10-10 ENCOUNTER — Encounter: Payer: Self-pay | Admitting: Certified Registered Nurse Anesthetist

## 2021-10-11 ENCOUNTER — Telehealth: Payer: Self-pay | Admitting: Gastroenterology

## 2021-10-11 ENCOUNTER — Encounter: Payer: Self-pay | Admitting: Gastroenterology

## 2021-10-11 ENCOUNTER — Ambulatory Visit (AMBULATORY_SURGERY_CENTER): Payer: Medicare Other | Admitting: Gastroenterology

## 2021-10-11 VITALS — BP 146/58 | HR 61 | Temp 97.1°F | Resp 11 | Ht 65.0 in | Wt 202.0 lb

## 2021-10-11 DIAGNOSIS — R933 Abnormal findings on diagnostic imaging of other parts of digestive tract: Secondary | ICD-10-CM

## 2021-10-11 DIAGNOSIS — K921 Melena: Secondary | ICD-10-CM | POA: Diagnosis not present

## 2021-10-11 DIAGNOSIS — I10 Essential (primary) hypertension: Secondary | ICD-10-CM | POA: Diagnosis not present

## 2021-10-11 DIAGNOSIS — D122 Benign neoplasm of ascending colon: Secondary | ICD-10-CM | POA: Diagnosis not present

## 2021-10-11 DIAGNOSIS — K648 Other hemorrhoids: Secondary | ICD-10-CM

## 2021-10-11 DIAGNOSIS — E119 Type 2 diabetes mellitus without complications: Secondary | ICD-10-CM | POA: Diagnosis not present

## 2021-10-11 DIAGNOSIS — R1310 Dysphagia, unspecified: Secondary | ICD-10-CM

## 2021-10-11 DIAGNOSIS — G4733 Obstructive sleep apnea (adult) (pediatric): Secondary | ICD-10-CM | POA: Diagnosis not present

## 2021-10-11 MED ORDER — SODIUM CHLORIDE 0.9 % IV SOLN: 500.0000 mL | INTRAVENOUS | Status: DC

## 2021-10-11 NOTE — Progress Notes (Signed)
1440 Robinul 0.1 mg IV given due large amount of secretions upon assessment.  MD made aware, vss  

## 2021-10-11 NOTE — Progress Notes (Signed)
[facility] History and Physical ? ? ?Primary Care Physician:  Owens Loffler, MD ? ? ?Reason for Procedure:   DYsphagia, history of rectal bleeding/ abnormal CT scan colon - last colonoscopy 2008 ? ?Plan:    EGD and colonoscopy ? ? ? ? ?HPI: [name] is a 71 y.o. female  here for EGD and colonoscopy to evaluate issues as outlined above. She has had dysphagia remotely that resolved with Savary dilation, symptoms have recurred recently. Also has had remote colonoscopy. A CT scan done for rectal bleeding/ abdominal pain a few months ago showed left sided colonic inflammation, conbcerning for possible ischemic colitis. No recurrent symptoms since then. On Eliquis for AF - held for 2 days. Patient denies any bowel symptoms at this time. Otherwise feels well without any cardiopulmonary symptoms.  ? ? ?Past Medical History:  ?Diagnosis Date  ? B12 deficiency   ? Complication of anesthesia   ? slow to wake up   ? Depression   ? Diabetes mellitus type 2, diet-controlled ([place]) 07/24/2018  ? Fibromyalgia   ? Generalized hyperhidrosis 04/17/2014  ? GERD (gastroesophageal reflux disease)   ? History of rheumatic fever as a child   ? Hyperlipemia   ? Hypertension   ? denies htn on 05/09/16  ? HYPOTHYROIDISM 05/29/2007  ? Irritable bowel syndrome   ? Migraines   ? hx of   ? OSA (obstructive sleep apnea) 06/26/2019  ? Senile calcific aortic valve sclerosis   ? Aortic Sclerosis-no stenosis  ? ? ?Past Surgical History:  ?Procedure Laterality Date  ? 15 HOUR [place] STUDY N/A 01/04/2016  ? Procedure: [place] STUDY;  Surgeon: Manus Gunning, MD;  Location: [facility] ENDOSCOPY;  Service: Gastroenterology;  Laterality: N/A;  ? ABDOMINAL HYSTERECTOMY  1988  ? ADENOIDECTOMY    ? ANTERIOR CERVICAL DECOMP/DISCECTOMY FUSION  2009, 1993  ? first procedure in ~ 1993/Dr Botero  ? APPENDECTOMY  1988  ? BACK SURGERY    ? CARDIOVERSION N/A 04/03/2019  ? Procedure: CARDIOVERSION;  Surgeon: Dorothy Spark, MD;  Location: [facility]  ENDOSCOPY;  Service: Cardiovascular;  Laterality: N/A;  ? CHOLECYSTECTOMY  2005  ? ESOPHAGEAL MANOMETRY N/A 01/04/2016  ? Procedure: ESOPHAGEAL MANOMETRY (EM) 24 HR PH;  Surgeon: Manus Gunning, MD;  Location: [facility] ENDOSCOPY;  Service: Gastroenterology;  Laterality: N/A;  ? ESOPHAGOGASTRODUODENOSCOPY (EGD) WITH PROPOFOL N/A 05/17/2016  ? Procedure: ESOPHAGOGASTRODUODENOSCOPY (EGD) WITH PROPOFOL;  Surgeon: Manus Gunning, MD;  Location: [facility] ENDOSCOPY;  Service: Gastroenterology;  Laterality: N/A;  ? NASAL SINUS SURGERY  2006  ? SAVORY DILATION N/A 05/17/2016  ? Procedure: SAVORY DILATION;  Surgeon: Manus Gunning, MD;  Location: [facility] ENDOSCOPY;  Service: Gastroenterology;  Laterality: N/A;  ? TEE WITHOUT CARDIOVERSION N/A 04/03/2019  ? Procedure: TRANSESOPHAGEAL ECHOCARDIOGRAM (TEE)-with DCCV;  Surgeon: Dorothy Spark, MD;  Location: [facility] ENDOSCOPY;; EF 55 to 60%.  No LVH.  Normal RV.  Moderate LA dilation.  No thrombus.  Mild RA dilation.  Mild to moderate TR.  Mild aortic valve sclerosis, no stenosis.  Moderately elevated PA pressures (47 mmHg)  ? TONSILLECTOMY AND ADENOIDECTOMY    ? TRANSTHORACIC ECHOCARDIOGRAM  2009  ? Normal LV size and function. EF 60-65%. No regional wall motion and amount is. Mild aortic sclerosis, no stenosis  ? TUBAL LIGATION  1979  ? ? ?Prior to Admission medications   ?Medication Sig Start Date End Date Taking? Authorizing Provider  ?acetaminophen (TYLENOL) 650 MG CR tablet Take 650 mg by mouth in the  morning and at bedtime.   Yes [provider]  ?B Complex Vitamins (B COMPLEX 100 PO) Take 1 tablet by mouth every morning.    Yes [provider]  ?Calcium Carbonate-Vitamin D (CALCIUM 600+D PO) Take 1 tablet by mouth in the morning and at bedtime.    Yes [provider]  ?Carboxymethylcellulose Sodium (ARTIFICIAL TEARS OP) Apply 1 drop to eye as needed.   Yes [provider]  ?cetirizine (ZYRTEC) 10 MG tablet Take 10 mg by mouth at  bedtime.    Yes [provider]  ?Cholecalciferol (VITAMIN D) 125 MCG (5000 UT) CAPS Take 5,000 Units by mouth every morning.    Yes [provider]  ?Coenzyme Q-10 100 MG capsule Take 100 mg by mouth every morning.    Yes [provider]  ?diltiazem (CARDIZEM CD) 240 MG 24 hr capsule TAKE 1 CAPSULE BY MOUTH EVERY DAY 11/11/20  Yes [name], MD  ?flecainide (TAMBOCOR) 100 MG tablet Take 1 tablet (100 mg total) by mouth 2 (two) times daily. 10/06/21  Yes Fenton, Clint R, PA  ?gabapentin (NEURONTIN) 300 MG capsule TAKE 1 CAPSULE BY MOUTH 3  TIMES DAILY 04/12/21  Yes Kirsteins, Luanna Salk, MD  ?GRAPE SEED EXTRACT PO Take 100 mg by mouth every morning.   Yes [provider]  ?loratadine (CLARITIN) 10 MG tablet Take 10 mg by mouth every morning.    Yes [provider]  ?Magnesium Bisglycinate (MAG GLYCINATE PO) Take 500 mg by mouth at bedtime.   Yes [provider]  ?methocarbamol (ROBAXIN) 500 MG tablet TAKE 1 TABLET BY MOUTH EVERY 8 HOURS AS NEEDED FOR MUSCLE SPASMS. 04/09/21  Yes Bayard Hugger, NP  ?mirtazapine (REMERON) 15 MG tablet Take 1/2 tab by mouth at bedtime 09/29/21  Yes Fenton, Clint R, PA  ?morphine (MS CONTIN) 15 MG 12 hr tablet Take 1 tablet (15 mg total) by mouth 3 (three) times daily. Do Not Fill Before 11/08/2021 10/06/21  Yes [name] L, NP  ?oxybutynin (DITROPAN-XL) 10 MG 24 hr tablet TAKE 1 TABLET BY MOUTH AT  BEDTIME 09/27/21  Yes Copland, Spencer, MD  ?polyethylene glycol powder (GLYCOLAX/MIRALAX) 17 GM/SCOOP powder Take 1 Container by mouth at bedtime.   Yes [provider]  ?RABEprazole (ACIPHEX) 20 MG tablet TAKE 1 TABLET BY MOUTH EVERY DAY 01/11/21  Yes Copland, Frederico Hamman, MD  ?vitamin C (ASCORBIC ACID) 500 MG tablet Take 1,000 mg by mouth daily.   Yes [provider]  ?carboxymethylcellulose (REFRESH PLUS) 0.5 % SOLN Place 1 drop into both eyes 3 (three) times daily as needed (dry eyes).     [provider]   ?clindamycin (CLEOCIN T) 1 % lotion Apply 1 application topically daily as needed (eczema).  06/07/19   [provider]  ?diclofenac sodium (VOLTAREN) 1 % GEL Apply 4 g topically 4 (four) times daily as needed (joint pain).     [provider]  ?dicyclomine (BENTYL) 20 MG tablet Take 1 tablet (20 mg total) by mouth as needed. As needed 09/29/21   Fenton, Clint R, PA  ?diltiazem (CARDIZEM) 30 MG tablet Take one tablet by mouth every 4 hours as needed for heart rate greater than 100, top number blood pressure needs to be above 100 04/14/21   Fenton, [place] R, PA  ?ELIQUIS 5 MG TABS tablet TAKE 1 TABLET BY MOUTH  TWICE DAILY 07/19/21   [name], MD  ?Flaxseed, Linseed, (FLAXSEED OIL) 1000 MG CAPS Take 1,000 mg  by mouth every other day. Take 1 capsule every other day.    [provider]  ?fluocinonide (LIDEX) 0.05 % external solution Apply 1 application topically daily as needed (eczema on scalp).  06/07/19   [provider]  ?levothyroxine (SYNTHROID) 50 MCG tablet TAKE 1 TABLET BY MOUTH  DAILY 09/27/21   Copland, Frederico Hamman, MD  ?oxymetazoline (AFRIN) 0.05 % nasal spray Place 1 spray into both nostrils 2 (two) times daily as needed for congestion.    [provider]  ?tretinoin (RETIN-A) 0.05 % cream Apply 1 application topically daily as needed (acne).  06/07/19   [provider]  ?Wheat Dextrin (BENEFIBER) CHEW Chew by mouth 2 (two) times daily. Takes 2 gummies 2 times a day.    [provider]  ? ? ?Current Outpatient Medications  ?Medication Sig Dispense Refill  ? acetaminophen (TYLENOL) 650 MG CR tablet Take 650 mg by mouth in the morning and at bedtime.    ? B Complex Vitamins (B COMPLEX 100 PO) Take 1 tablet by mouth every morning.     ? Calcium Carbonate-Vitamin D (CALCIUM 600+D PO) Take 1 tablet by mouth in the morning and at bedtime.     ? Carboxymethylcellulose Sodium (ARTIFICIAL TEARS OP) Apply 1 drop to eye as needed.    ? cetirizine (ZYRTEC)  10 MG tablet Take 10 mg by mouth at bedtime.     ? Cholecalciferol (VITAMIN D) 125 MCG (5000 UT) CAPS Take 5,000 Units by mouth every morning.     ? Coenzyme Q-10 100 MG capsule Take 100 mg by mouth every mor

## 2021-10-11 NOTE — Op Note (Signed)
[facility] ?Patient Name: [name] ?Procedure Date: 10/11/2021 2:35 PM ?MRN: [id number] ?Endoscopist: Carlota Raspberry. Havery Moros , MD ?Age: 71 ?Referring MD:  ?Date of Birth: [birth year] ?Gender: Female ?Account #: [id number] ?Procedure:                Upper GI endoscopy ?Indications:              Dysphagia - prior normal manometry, barium study  ?                          with mild dysmotility. Empiric dilation in 2017  ?                          resolved symptoms for some time. Now intermittent  ?                          dysphagia to solids and pills. Prior biopsies of  ?                          the esophagus showed no evidence of EoE ?Medicines:                Monitored Anesthesia Care ?Procedure:                Pre-Anesthesia Assessment: ?                          - Prior to the procedure, a History and Physical  ?                          was performed, and patient medications and  ?                          allergies were reviewed. The patient's tolerance of  ?                          previous anesthesia was also reviewed. The risks  ?                          and benefits of the procedure and the sedation  ?                          options and risks were discussed with the patient.  ?                          All questions were answered, and informed consent  ?                          was obtained. Prior Anticoagulants: The patient has  ?                          taken Eliquis (apixaban), last dose was 2 days  ?                          prior to procedure. ASA Grade Assessment: II - A  ?  patient with mild systemic disease. After reviewing  ?                          the risks and benefits, the patient was deemed in  ?                          satisfactory condition to undergo the procedure. ?                          After obtaining informed consent, the endoscope was  ?                          passed under direct vision. Throughout the  ?                           procedure, the patient's blood pressure, pulse, and  ?                          oxygen saturations were monitored continuously. The  ?                          GIF [id number] #[id number] was introduced through the  ?                          mouth, and advanced to the second part of duodenum.  ?                          The upper GI endoscopy was accomplished without  ?                          difficulty. The patient tolerated the procedure  ?                          well. ?Scope In: ?Scope Out: ?Findings:                 Esophagogastric landmarks were identified: the  ?                          Z-line was found at 39 cm, the gastroesophageal  ?                          junction was found at 39 cm and the upper extent of  ?                          the gastric folds was found at 39 cm from the  ?                          incisors. ?                          The Z-line was slightly irregular and was found 39  ?  cm from the incisors, did not meet criteria for  ?                          Barrett's esophagus. ?                          The exam of the esophagus was otherwise normal. ?                          A guidewire was placed and the scope was withdrawn.  ?                          Empiric dilation was performed in the entire  ?                          esophagus with a Savary dilator with mild  ?                          resistance at 17 mm and 18 mm, relook endoscopy  ?                          showed no mucosal wrents. ?                          The entire examined stomach was normal. ?                          The duodenal bulb and second portion of the  ?                          duodenum were normal. ?Complications:            No immediate complications. Estimated blood loss:  ?                          Minimal. ?Estimated Blood Loss:     Estimated blood loss was minimal. ?Impression:               - Esophagogastric landmarks identified. ?                          - Z-line irregular, 39 cm  from the incisors, normal  ?                          variant did not meet criteria for Barrett's. ?                          - Normal esophagus otherwise - empiric dilation  ?                          performed to 68m ?                          - Normal stomach. ?                          -  Normal duodenal bulb and second portion of the  ?                          duodenum. ?Recommendation:           - Patient has a contact number available for  ?                          emergencies. The signs and symptoms of potential  ?                          delayed complications were discussed with the  ?                          patient. Return to normal activities tomorrow.  ?                          Written discharge instructions were provided to the  ?                          patient. ?                          - Resume previous diet. ?                          - Continue present medications. ?                          - Resume Eliquis tomorrow ?Remo Lipps P. Marga Gramajo, MD ?10/11/2021 3:31:55 PM ?This report has been signed electronically. ?

## 2021-10-11 NOTE — Patient Instructions (Addendum)
Handouts on hemorrhoids and polyps to patient. Await pathology results. ?Resume previous diet and continue present medications - resume Eliquis tonight  ?Repeat colonoscopy for screening will be determined based off of pathology results. ? ? ?YOU HAD AN ENDOSCOPIC PROCEDURE TODAY AT [facility]:   Refer to the procedure report that was given to you for any specific questions about what was found during the examination.  If the procedure report does not answer your questions, please call your gastroenterologist to clarify.  If you requested that your care partner not be given the details of your procedure findings, then the procedure report has been included in a sealed envelope for you to review at your convenience later. ? ?YOU SHOULD EXPECT: Some feelings of bloating in the abdomen. Passage of more gas than usual.  Walking can help get rid of the air that was put into your GI tract during the procedure and reduce the bloating. If you had a lower endoscopy (such as a colonoscopy or flexible sigmoidoscopy) you may notice spotting of blood in your stool or on the toilet paper. If you underwent a bowel prep for your procedure, you may not have a normal bowel movement for a few days. ? ?Please Note:  You might notice some irritation and congestion in your nose or some drainage.  This is from the oxygen used during your procedure.  There is no need for concern and it should clear up in a day or so. ? ?SYMPTOMS TO REPORT IMMEDIATELY: ? ?Following lower endoscopy (colonoscopy or flexible sigmoidoscopy): ? Excessive amounts of blood in the stool ? Significant tenderness or worsening of abdominal pains ? Swelling of the abdomen that is new, acute ? Fever of 100?F or higher ? ?Following upper endoscopy (EGD) ? Vomiting of blood or coffee ground material ? New chest pain or pain under the shoulder blades ? Painful or persistently difficult swallowing ? New shortness of breath ? Fever of 100?F or  higher ? Black, tarry-looking stools ? ?For urgent or emergent issues, a gastroenterologist can be reached at any hour by calling [phone]. ?Do not use MyChart messaging for urgent concerns.  ? ? ?DIET:  We do recommend a small meal at first, but then you may proceed to your regular diet.  Drink plenty of fluids but you should avoid alcoholic beverages for 24 hours. ? ?ACTIVITY:  You should plan to take it easy for the rest of today and you should NOT DRIVE or use heavy machinery until tomorrow (because of the sedation medicines used during the test).   ? ?FOLLOW UP: ?Our staff will call the number listed on your records 48-72 hours following your procedure to check on you and address any questions or concerns that you may have regarding the information given to you following your procedure. If we do not reach you, we will leave a message.  We will attempt to reach you two times.  During this call, we will ask if you have developed any symptoms of COVID 19. If you develop any symptoms (ie: fever, flu-like symptoms, shortness of breath, cough etc.) before then, please call [phone].  If you test positive for Covid 19 in the 2 weeks post procedure, please call and report this information to [place].   ? ?If any biopsies were taken you will be contacted by phone or by letter within the next 1-3 weeks.  Please call us at [phone] if you have not heard about the biopsies in 3 weeks.  ? ? ?  SIGNATURES/CONFIDENTIALITY: ?You and/or your care partner have signed paperwork which will be entered into your electronic medical record.  These signatures attest to the fact that that the information above on your After Visit Summary has been reviewed and is understood.  Full responsibility of the confidentiality of this discharge information lies with you and/or your care-partner.  ?

## 2021-10-11 NOTE — Telephone Encounter (Signed)
Patient called, has scheduled EGD/Colonoscopy for today. Patient states the  ?Plenvu fruit punch packet contains aspartame, which causes terrible migraine headaches for patient. Can this one packet be left off? Apparently the other Plenvu packets do not contain aspartame - only the fruit punch flavored packet lists aspartame as an ingredient. Please advise.  ?

## 2021-10-11 NOTE — Telephone Encounter (Signed)
Pt called asking if she could not take the second half of the plenvu prep because it contains aspartame and this gives her migraines. I advised her she could take half of a bottle of miralax and mix with 32 ounces of Gatorade. She verbalized understanding. ?

## 2021-10-11 NOTE — Progress Notes (Signed)
Report given to PACU, vss 

## 2021-10-11 NOTE — Progress Notes (Signed)
Called to room to assist during endoscopic procedure.  Patient ID and intended procedure confirmed with present staff. Received instructions for my participation in the procedure from the performing physician.  

## 2021-10-11 NOTE — Op Note (Addendum)
[facility] ?Patient Name: [name] ?Procedure Date: 10/11/2021 2:35 PM ?MRN: [id number] ?Endoscopist: Carlota Raspberry. Havery Moros , MD ?Age: 71 ?Referring MD:  ?Date of Birth: 1951/02/22 ?Gender: Female ?Account #: [id number] ?Procedure:                Colonoscopy ?Indications:              Hematochezia, Abnormal CT of the GI tract with left  ?                          sided / splenic flexure thickening in Feb 2023 -  ?                          concerning for ischemic vs. infectious colitis,  ?                          last colonoscopy 2008 - resolution of symptoms  ?                          since office visit ?Medicines:                Monitored Anesthesia Care ?Procedure:                Pre-Anesthesia Assessment: ?                          - Prior to the procedure, a History and Physical  ?                          was performed, and patient medications and  ?                          allergies were reviewed. The patient's tolerance of  ?                          previous anesthesia was also reviewed. The risks  ?                          and benefits of the procedure and the sedation  ?                          options and risks were discussed with the patient.  ?                          All questions were answered, and informed consent  ?                          was obtained. Prior Anticoagulants: The patient has  ?                          taken Eliquis (apixaban), last dose was 2 days  ?                          prior to procedure. ASA Grade Assessment: II - A  ?  patient with mild systemic disease. After reviewing  ?                          the risks and benefits, the patient was deemed in  ?                          satisfactory condition to undergo the procedure. ?                          After obtaining informed consent, the colonoscope  ?                          was passed under direct vision. Throughout the  ?                          procedure, the patient's blood  pressure, pulse, and  ?                          oxygen saturations were monitored continuously. The  ?                          Olympus Scope [id number] was introduced through the  ?                          anus and advanced to the the terminal ileum, with  ?                          identification of the appendiceal orifice and IC  ?                          valve. The colonoscopy was performed without  ?                          difficulty. The patient tolerated the procedure  ?                          well. The quality of the bowel preparation was  ?                          adequate. The terminal ileum, ileocecal valve,  ?                          appendiceal orifice, and rectum were photographed. ?Scope In: 2:59:48 PM ?Scope Out: 3:20:13 PM ?Scope Withdrawal Time: 0 hours 15 minutes 46 seconds  ?Total Procedure Duration: 0 hours 20 minutes 25 seconds  ?Findings:                 The perianal and digital rectal examinations were  ?                          normal. ?                          Limited views of the terminal ileum appeared normal. ?  Two sessile polyps were found in the ascending  ?                          colon. The polyps were 2 to 4 mm in size. These  ?                          polyps were removed with a cold snare. Resection  ?                          and retrieval were complete. ?                          Internal hemorrhoids were found during  ?                          retroflexion. The hemorrhoids were small. ?                          A large amount of liquid stool was found in the  ?                          entire colon, making visualization difficult.  ?                          Lavage of the colon was performed using copious  ?                          amounts of sterile water, resulting in clearance  ?                          with adequate visualization. ?                          The exam was otherwise without abnormality. ?Complications:            No  immediate complications. Estimated blood loss:  ?                          Minimal. ?Estimated Blood Loss:     Estimated blood loss was minimal. ?Impression:               - The examined portion of the ileum was normal. ?                          - Two 2 to 4 mm polyps in the ascending colon,  ?                          removed with a cold snare. Resected and retrieved. ?                          - Internal hemorrhoids. ?                          - Stool in the entire examined colon, several  ?  minutes spent lavaging the colon to achieve  ?                          adequate views. ?                          - The examination was otherwise normal. ?                          Suspect patient may have had ischemic colitis which  ?                          has since resolved. ?Recommendation:           - Patient has a contact number available for  ?                          emergencies. The signs and symptoms of potential  ?                          delayed complications were discussed with the  ?                          patient. Return to normal activities tomorrow.  ?                          Written discharge instructions were provided to the  ?                          patient. ?                          - Resume previous diet. ?                          - Continue present medications. ?                          - Resume Eliquis tomorrow ?                          - Await pathology results. ?                          - Continue constipation regimen to minimize risk  ?                          for recurrence ?Remo Lipps P. Sebastyan Snodgrass, MD ?10/11/2021 3:26:07 PM ?This report has been signed electronically. ?

## 2021-10-12 LAB — TOXASSURE SELECT,+ANTIDEPR,UR

## 2021-10-13 ENCOUNTER — Telehealth: Payer: Self-pay | Admitting: *Deleted

## 2021-10-13 NOTE — Telephone Encounter (Signed)
?  Follow up Call- ? ? ?  10/11/2021  ?  2:07 PM  ?Call back number  ?Post procedure Call Back phone  # [phone]  ?Permission to leave phone message Yes  ?  ? ?Patient questions: ?Message left to call if necessary. ?

## 2021-10-13 NOTE — Telephone Encounter (Signed)
?  Follow up Call- ? ? ?  10/11/2021  ?  2:07 PM  ?Call back number  ?Post procedure Call Back phone  # [phone]  ?Permission to leave phone message Yes  ?  ? ?Patient questions: ? ?Do you have a fever, pain , or abdominal swelling? No. ?Pain Score  0 * ? ?Have you tolerated food without any problems? Yes.   ? ?Have you been able to return to your normal activities? Yes.   ? ?Do you have any questions about your discharge instructions: ?Diet   No. ?Medications  No. ?Follow up visit  No. ? ?Do you have questions or concerns about your Care? No. ? ?Actions: ?* If pain score is 4 or above: ?No action needed, pain <4. ? ? ?

## 2021-10-14 ENCOUNTER — Telehealth: Payer: Self-pay | Admitting: *Deleted

## 2021-10-14 NOTE — Telephone Encounter (Signed)
Urine drug screen for this encounter is consistent for prescribed medication 

## 2021-10-24 NOTE — Progress Notes (Signed)
?Electrophysiology Office Note:   ? ?Date:  10/27/2021  ? ?ID:  [name], DOB Jan 26, 1951, MRN [id number] ? ?PCP:  Owens Loffler, MD  ?[facility] Cardiologist:  Glenetta Hew, MD  ?[facility] Electrophysiologist:  None  ? ?Referring MD: Owens Loffler, MD  ? ?Chief Complaint: Atrial fibrillation ? ?History of Present Illness:   ? ?[name] is a 71 y.o. female who presents for an evaluation of atrial fibrillation at the request of Adline Peals, PA-C. Their medical history includes fibromyalgia, diabetes, atrial flutter, hypertension, obstructive sleep apnea, atrial fibrillation.  The patient was last seen by Adline Peals on September 29, 2021. ?The patient also is being evaluated by Dr. Havery Moros for bright red blood per rectum.  The patient is previously used flecainide for rhythm control.  She is also on diltiazem.  At the last appointment with [facility], the patient asked about and so she presents today to discuss this is a potential stroke risk mitigation strategy. ? ?Today she tells me she is highly symptomatic when in atrial fibrillation.  Her episodes typically start with sweating, nausea and vomiting.  She will be extremely fatigued and lightheaded.  The episodes can last hours to over a day.  They happen despite treatment with flecainide.  She takes Eliquis for stroke prophylaxis.  She did have an episode of bright red blood per rectum but this was evaluated with GI.  It was thought to be secondary to an infection which is now resolved.  She has had an EGD and colonoscopy which were okay. ? ?  ?Past Medical History:  ?Diagnosis Date  ? B12 deficiency   ? Complication of anesthesia   ? slow to wake up   ? Depression   ? Diabetes mellitus type 2, diet-controlled ([place]) 07/24/2018  ? Fibromyalgia   ? Generalized hyperhidrosis 04/17/2014  ? GERD (gastroesophageal reflux disease)   ? History of rheumatic fever as a child   ? Hyperlipemia   ? Hypertension   ? denies htn on 05/09/16  ? HYPOTHYROIDISM  05/29/2007  ? Irritable bowel syndrome   ? Migraines   ? hx of   ? OSA (obstructive sleep apnea) 06/26/2019  ? Senile calcific aortic valve sclerosis   ? Aortic Sclerosis-no stenosis  ? ? ?Past Surgical History:  ?Procedure Laterality Date  ? 41 HOUR [place] STUDY N/A 01/04/2016  ? Procedure: [place] STUDY;  Surgeon: Manus Gunning, MD;  Location: [facility];  Service: Gastroenterology;  Laterality: N/A;  ? ABDOMINAL HYSTERECTOMY  1988  ? ADENOIDECTOMY    ? ANTERIOR CERVICAL DECOMP/DISCECTOMY FUSION  2009, 1993  ? first procedure in ~ 1993/Dr Botero  ? APPENDECTOMY  1988  ? BACK SURGERY    ? CARDIOVERSION N/A 04/03/2019  ? Procedure: CARDIOVERSION;  Surgeon: Dorothy Spark, MD;  Location[facility];  Service: Cardiovascular;  Laterality: N/A;  ? CHOLECYSTECTOMY  2005  ? ESOPHAGEAL MANOMETRY N/A 01/04/2016  ? Procedure: ESOPHAGEAL MANOMETRY (EM) 24 HR PH;  Surgeon: Manus Gunning, MD;  Location: [facility];  Service: Gastroenterology;  Laterality: N/A;  ? ESOPHAGOGASTRODUODENOSCOPY (EGD) WITH PROPOFOL N/A 05/17/2016  ? Procedure: ESOPHAGOGASTRODUODENOSCOPY (EGD) WITH PROPOFOL;  Surgeon: Manus Gunning, MD;  Location: [facility];  Service: Gastroenterology;  Laterality: N/A;  ? NASAL SINUS SURGERY  2006  ? SAVORY DILATION N/A 05/17/2016  ? Procedure: SAVORY DILATION;  Surgeon: Manus Gunning, MD;  Location: [facility];  Service: Gastroenterology;  Laterality: N/A;  ? TEE WITHOUT CARDIOVERSION N/A 04/03/2019  ?  Procedure: TRANSESOPHAGEAL ECHOCARDIOGRAM (TEE)-with DCCV;  Surgeon: Dorothy Spark, MD;  Location: [facility] ENDOSCOPY;; EF 55 to 60%.  No LVH.  Normal RV.  Moderate LA dilation.  No thrombus.  Mild RA dilation.  Mild to moderate TR.  Mild aortic valve sclerosis, no stenosis.  Moderately elevated PA pressures (47 mmHg)  ? TONSILLECTOMY AND ADENOIDECTOMY    ? TRANSTHORACIC ECHOCARDIOGRAM  2009  ? Normal LV size and function. EF 60-65%. No regional wall motion and amount is.  Mild aortic sclerosis, no stenosis  ? TUBAL LIGATION  1979  ? ? ?Current Medications: ?Current Meds  ?Medication Sig  ? acetaminophen (TYLENOL) 650 MG CR tablet Take 650 mg by mouth in the morning and at bedtime.  ? B Complex Vitamins (B COMPLEX 100 PO) Take 1 tablet by mouth every morning.   ? Calcium Carbonate-Vitamin D (CALCIUM 600+D PO) Take 1 tablet by mouth in the morning and at bedtime.   ? carboxymethylcellulose (REFRESH PLUS) 0.5 % SOLN Place 1 drop into both eyes 3 (three) times daily as needed (dry eyes).   ? Carboxymethylcellulose Sodium (ARTIFICIAL TEARS OP) Apply 1 drop to eye as needed.  ? cetirizine (ZYRTEC) 10 MG tablet Take 10 mg by mouth at bedtime.   ? Cholecalciferol (VITAMIN D) 125 MCG (5000 UT) CAPS Take 5,000 Units by mouth every morning.   ? clindamycin (CLEOCIN T) 1 % lotion Apply 1 application topically daily as needed (eczema).   ? Coenzyme Q-10 100 MG capsule Take 100 mg by mouth every morning.   ? diclofenac sodium (VOLTAREN) 1 % GEL Apply 4 g topically 4 (four) times daily as needed (joint pain).   ? dicyclomine (BENTYL) 20 MG tablet Take 1 tablet (20 mg total) by mouth as needed. As needed  ? diltiazem (CARDIZEM CD) 240 MG 24 hr capsule TAKE 1 CAPSULE BY MOUTH EVERY DAY  ? diltiazem (CARDIZEM) 30 MG tablet Take one tablet by mouth every 4 hours as needed for heart rate greater than 100, top number blood pressure needs to be above 100  ? docusate sodium (COLACE) 250 MG capsule Take 1 capsule by mouth daily.  ? ELIQUIS 5 MG TABS tablet TAKE 1 TABLET BY MOUTH  TWICE DAILY  ? Flaxseed, Linseed, (FLAXSEED OIL) 1000 MG CAPS Take 1,000 mg by mouth every other day. Take 1 capsule every other day.  ? flecainide (TAMBOCOR) 100 MG tablet Take 1 tablet (100 mg total) by mouth 2 (two) times daily.  ? fluocinonide (LIDEX) 0.05 % external solution Apply 1 application topically daily as needed (eczema on scalp).   ? gabapentin (NEURONTIN) 300 MG capsule TAKE 1 CAPSULE BY MOUTH 3  TIMES DAILY  ? GRAPE  SEED EXTRACT PO Take 100 mg by mouth every morning.  ? levothyroxine (SYNTHROID) 50 MCG tablet TAKE 1 TABLET BY MOUTH  DAILY  ? loratadine (CLARITIN) 10 MG tablet Take 10 mg by mouth every morning.   ? Magnesium Bisglycinate (MAG GLYCINATE PO) Take 500 mg by mouth at bedtime.  ? methocarbamol (ROBAXIN) 500 MG tablet TAKE 1 TABLET BY MOUTH EVERY 8 HOURS AS NEEDED FOR MUSCLE SPASMS.  ? mirtazapine (REMERON) 15 MG tablet Take 1/2 tab by mouth at bedtime  ? morphine (MS CONTIN) 15 MG 12 hr tablet Take 1 tablet (15 mg total) by mouth 3 (three) times daily. Do Not Fill Before 11/08/2021  ? oxybutynin (DITROPAN-XL) 10 MG 24 hr tablet TAKE 1 TABLET BY MOUTH AT  BEDTIME  ? oxymetazoline (AFRIN) 0.05 %  nasal spray Place 1 spray into both nostrils 2 (two) times daily as needed for congestion.  ? polyethylene glycol powder (GLYCOLAX/MIRALAX) 17 GM/SCOOP powder Take 1 Container by mouth at bedtime.  ? RABEprazole (ACIPHEX) 20 MG tablet TAKE 1 TABLET BY MOUTH EVERY DAY  ? tretinoin (RETIN-A) 0.05 % cream Apply 1 application topically daily as needed (acne).   ? vitamin C (ASCORBIC ACID) 500 MG tablet Take 1,000 mg by mouth daily.  ? Wheat Dextrin (BENEFIBER) CHEW Chew by mouth 2 (two) times daily. Takes 2 gummies 2 times a day.  ?  ? ?Allergies:   Cyclobenzaprine hcl, Rizatriptan benzoate, Sumatriptan, Tizanidine, Aleve [naproxen sodium], Clarithromycin, Corn-containing products, Doxycycline, Duloxetine, Egg [eggs or egg-derived products], Keflex [cephalexin], Metronidazole, Oxycodone hcl, Oxycontin [oxycodone hcl], Penicillins, Rosuvastatin, Venlafaxine, Voltaren [diclofenac sodium], Septra [sulfamethoxazole-trimethoprim], and Tramadol  ? ?Social History  ? ?Socioeconomic History  ? Marital status: Married  ?  Spouse name: Not on file  ? Number of children: Not on file  ? Years of education: Not on file  ? Highest education level: Not on file  ?Occupational History  ? Occupation: Radiation protection practitioner  ? Occupation: retired  ?Tobacco Use   ? Smoking status: Never  ? Smokeless tobacco: Never  ?Vaping Use  ? Vaping Use: Never used  ?Substance and Sexual Activity  ? Alcohol use: Yes  ?  Alcohol/week: 1.0 standard drink  ?  Types: 1 Glasses of

## 2021-10-26 ENCOUNTER — Ambulatory Visit (HOSPITAL_COMMUNITY): Payer: Medicare Other | Admitting: Physician Assistant

## 2021-10-27 ENCOUNTER — Encounter: Payer: Self-pay | Admitting: *Deleted

## 2021-10-27 ENCOUNTER — Ambulatory Visit: Payer: Medicare Other | Admitting: Cardiology

## 2021-10-27 ENCOUNTER — Encounter: Payer: Self-pay | Admitting: Cardiology

## 2021-10-27 VITALS — BP 124/64 | HR 72 | Ht 65.5 in | Wt 199.4 lb

## 2021-10-27 DIAGNOSIS — E669 Obesity, unspecified: Secondary | ICD-10-CM

## 2021-10-27 DIAGNOSIS — I483 Typical atrial flutter: Secondary | ICD-10-CM

## 2021-10-27 DIAGNOSIS — I4819 Other persistent atrial fibrillation: Secondary | ICD-10-CM | POA: Diagnosis not present

## 2021-10-27 NOTE — Patient Instructions (Addendum)
Medications: ?Your physician recommends that you continue on your current medications as directed. Please refer to the Current Medication list given to you today. ?*If you need a refill on your cardiac medications before your next appointment, please call your pharmacy* ? ?Lab Work: ?None. ?If you have labs (blood work) drawn today and your tests are completely normal, you will receive your results only by: ?MyChart Message (if you have MyChart) OR ?A paper copy in the mail ?If you have any lab test that is abnormal or we need to change your treatment, we will call you to review the results. ? ?Testing/Procedures: ?Your physician has recommended that you have an ablation. Catheter ablation is a medical procedure used to treat some cardiac arrhythmias (irregular heartbeats). During catheter ablation, a long, thin, flexible tube is put into a blood vessel in your groin (upper thigh), or neck. This tube is called an ablation catheter. It is then guided to your heart through the blood vessel. Radio frequency waves destroy small areas of heart tissue where abnormal heartbeats may cause an arrhythmia to start. Please see the instruction sheet given to you today. ? ? ?Follow-Up: ?At [facility], you and your health needs are our priority.  As part of our continuing mission to provide you with exceptional heart care, we have created designated Provider Care Teams.  These Care Teams include your primary Cardiologist (physician) and Advanced Practice Providers (APPs -  Physician Assistants and Nurse Practitioners) who all work together to provide you with the care you need, when you need it. ? ?Your physician wants you to follow-up in: See instruction letter.  ? ?We recommend signing up for the patient portal called "MyChart".  Sign up information is provided on this After Visit Summary.  MyChart is used to connect with patients for Virtual Visits (Telemedicine).  Patients are able to view lab/test results, encounter  notes, upcoming appointments, etc.  Non-urgent messages can be sent to your provider as well.   ?To learn more about what you can do with MyChart, go to [email or web address].   ? ?Any Other Special Instructions Will Be Listed Below (If Applicable). ? ?Cardiac Ablation ?Cardiac ablation is a procedure to destroy (ablate) some heart tissue that is sending bad signals. These bad signals cause problems in heart rhythm. ?The heart has many areas that make these signals. If there are problems in these areas, they can make the heart beat in a way that is not normal. Destroying some tissues can help make the heart rhythm normal. ?Tell your doctor about: ?Any allergies you have. ?All medicines you are taking. These include vitamins, herbs, eye drops, creams, and over-the-counter medicines. ?Any problems you or family members have had with medicines that make you fall asleep (anesthetics). ?Any blood disorders you have. ?Any surgeries you have had. ?Any medical conditions you have, such as kidney failure. ?Whether you are pregnant or may be pregnant. ?What are the risks? ?This is a safe procedure. But problems may occur, including: ?Infection. ?Bruising and bleeding. ?Bleeding into the chest. ?Stroke or blood clots. ?Damage to nearby areas of your body. ?Allergies to medicines or dyes. ?The need for a pacemaker if the normal system is damaged. ?Failure of the procedure to treat the problem. ?What happens before the procedure? ?Medicines ?Ask your doctor about: ?Changing or stopping your normal medicines. This is important. ?Taking aspirin and ibuprofen. Do not take these medicines unless your doctor tells you to take them. ?Taking other medicines, vitamins, herbs, and supplements. ?General instructions ?Follow  instructions from your doctor about what you cannot eat or drink. ?Plan to have someone take you home from the hospital or clinic. ?If you will be going home right after the procedure, plan to have someone with you  for 24 hours. ?Ask your doctor what steps will be taken to prevent infection. ?What happens during the procedure? ? ?An IV tube will be put into one of your veins. ?You will be given a medicine to help you relax. ?The skin on your neck or groin will be numbed. ?A cut (incision) will be made in your neck or groin. A needle will be put through your cut and into a large vein. ?A tube (catheter) will be put into the needle. The tube will be moved to your heart. ?Dye may be put through the tube. This helps your doctor see your heart. ?Small devices (electrodes) on the tube will send out signals. ?A type of energy will be used to destroy some heart tissue. ?The tube will be taken out. ?Pressure will be held on your cut. This helps stop bleeding. ?A bandage will be put over your cut. ?The exact procedure may vary among doctors and hospitals. ?What happens after the procedure? ?You will be watched until you leave the hospital or clinic. This includes checking your heart rate, breathing rate, oxygen, and blood pressure. ?Your cut will be watched for bleeding. You will need to lie still for a few hours. ?Do not drive for 24 hours or as long as your doctor tells you. ?Summary ?Cardiac ablation is a procedure to destroy some heart tissue. This is done to treat heart rhythm problems. ?Tell your doctor about any medical conditions you may have. Tell him or her about all medicines you are taking to treat them. ?This is a safe procedure. But problems may occur. These include infection, bruising, bleeding, and damage to nearby areas of your body. ?Follow what your doctor tells you about food and drink. You may also be told to change or stop some of your medicines. ?After the procedure, do not drive for 24 hours or as long as your doctor tells you. ?This information is not intended to replace advice given to you by your health care provider. Make sure you discuss any questions you have with your health care provider. ?Document  Revised: 05/16/2019 Document Reviewed: 05/16/2019 ?Elsevier Patient Education ? [place]. ? ? ? ?

## 2021-11-17 ENCOUNTER — Encounter: Payer: Medicare Other | Attending: Physical Medicine & Rehabilitation | Admitting: Registered Nurse

## 2021-11-17 VITALS — BP 114/69 | HR 60 | Ht 65.5 in | Wt 203.4 lb

## 2021-11-17 DIAGNOSIS — M170 Bilateral primary osteoarthritis of knee: Secondary | ICD-10-CM | POA: Insufficient documentation

## 2021-11-17 DIAGNOSIS — G8929 Other chronic pain: Secondary | ICD-10-CM | POA: Insufficient documentation

## 2021-11-17 DIAGNOSIS — Z5181 Encounter for therapeutic drug level monitoring: Secondary | ICD-10-CM | POA: Insufficient documentation

## 2021-11-17 DIAGNOSIS — G894 Chronic pain syndrome: Secondary | ICD-10-CM | POA: Diagnosis not present

## 2021-11-17 DIAGNOSIS — M546 Pain in thoracic spine: Secondary | ICD-10-CM | POA: Insufficient documentation

## 2021-11-17 DIAGNOSIS — Z79891 Long term (current) use of opiate analgesic: Secondary | ICD-10-CM | POA: Insufficient documentation

## 2021-11-17 NOTE — Progress Notes (Signed)
Subjective:    Patient ID: [name], female    DOB: [birth year], 71 y.o.   MRN: [id number]  HPI: [name] is a 71 y.o. female who returns for follow up appointment for chronic pain and medication refill. She states her  pain is located in her mid-back and bilateral knee pain. She rates her pain 4. Her current exercise regime is walking and performing stretching exercises.  Ms. [place]ans[place] Morphine equivalent is 45.00 MME.   Last UDS was Performed on 10/06/2021, it was consistent.     Pain Inventory Average Pain 6 Pain Right Now 4 My pain is constant, burning, and aching  In the last 24 hours, has pain interfered with the following? General activity 5 Relation with others 4 Enjoyment of life 4 What TIME of day is your pain at its worst? evening and night Sleep (in general) Good  Pain is worse with: walking, sitting, standing, and some activites Pain improves with: rest, heat/ice, and medication Relief from Meds: 5  Family History  Problem Relation Age of Onset   Breast cancer Mother    Diabetes Mother    Heart disease Mother    Allergies Mother    Diabetes Father    Heart disease Father    Emphysema Father    Allergies Father    Breast cancer Paternal Aunt    Colon cancer Neg Hx    Esophageal cancer Neg Hx    Rectal cancer Neg Hx    Stomach cancer Neg Hx    Social History   Socioeconomic History   Marital status: Married    Spouse name: Not on file   Number of children: Not on file   Years of education: Not on file   Highest education level: Not on file  Occupational History   Occupation: Radiation protection practitioner   Occupation: retired  Tobacco Use   Smoking status: Never   Smokeless tobacco: Never  Vaping Use   Vaping Use: Never used  Substance and Sexual Activity   Alcohol use: Yes    Alcohol/week: 1.0 standard drink    Types: 1 Glasses of wine per week    Comment: rare   Drug use: Never   Sexual activity: Not Currently  Other Topics Concern   Not on file   Social History Narrative   Not on file   Social Determinants of Health   Financial Resource Strain: Low Risk    Difficulty of Paying Living Expenses: Not hard at all  Food Insecurity: No Food Insecurity   Worried About Charity fundraiser in the Last Year: Never true   [place] in the Last Year: Never true  Transportation Needs: No Transportation Needs   Lack of Transportation (Medical): No   Lack of Transportation (Non-Medical): No  Physical Activity: Sufficiently Active   Days of Exercise per Week: 7 days   Minutes of Exercise per Session: 30 min  Stress: No Stress Concern Present   Feeling of Stress : Not at all  Social Connections: Socially Integrated   Frequency of Communication with Friends and Family: Three times a week   Frequency of Social Gatherings with Friends and Family: More than three times a week   Attends Religious Services: More than 4 times per year   Active Member of Clubs or Organizations: Yes   Attends Archivist Meetings: More than 4 times per year   Marital Status: Married   Past Surgical History:  Procedure Laterality Date   24  HOUR [place] STUDY N/A 01/04/2016   Procedure: 24 HOUR PH STUDY;  Surgeon: Manus Gunning, MD;  Location: [facility];  Service: Gastroenterology;  Laterality: N/A;   ABDOMINAL HYSTERECTOMY  1988   ADENOIDECTOMY     ANTERIOR CERVICAL DECOMP/DISCECTOMY FUSION  2009, 1993   first procedure in ~ 1993/Dr [place] N/A 04/03/2019   Procedure: CARDIOVERSION;  Surgeon: Dorothy Spark, MD;  Location: [place];  Service: Cardiovascular;  Laterality: N/A;   CHOLECYSTECTOMY  2005   ESOPHAGEAL MANOMETRY N/A 01/04/2016   Procedure: ESOPHAGEAL MANOMETRY (EM) 24 HR PH;  Surgeon: Manus Gunning, MD;  Location: [facility];  Service: Gastroenterology;  Laterality: N/A;   ESOPHAGOGASTRODUODENOSCOPY (EGD) WITH PROPOFOL N/A 05/17/2016   Procedure:  ESOPHAGOGASTRODUODENOSCOPY (EGD) WITH PROPOFOL;  Surgeon: Manus Gunning, MD;  Location: [facility];  Service: Gastroenterology;  Laterality: N/A;   NASAL SINUS SURGERY  2006   SAVORY DILATION N/A 05/17/2016   Procedure: SAVORY DILATION;  Surgeon: Manus Gunning, MD;  Location: [facility];  Service: Gastroenterology;  Laterality: N/A;   TEE WITHOUT CARDIOVERSION N/A 04/03/2019   Procedure: TRANSESOPHAGEAL ECHOCARDIOGRAM (TEE)-with DCCV;  Surgeon: Dorothy Spark, MD;  Location: [facility];; EF 55 to 60%.  No LVH.  Normal RV.  Moderate LA dilation.  No thrombus.  Mild RA dilation.  Mild to moderate TR.  Mild aortic valve sclerosis, no stenosis.  Moderately elevated PA pressures (47 mmHg)   TONSILLECTOMY AND ADENOIDECTOMY     TRANSTHORACIC ECHOCARDIOGRAM  2009   Normal LV size and function. EF 60-65%. No regional wall motion and amount is. Mild aortic sclerosis, no stenosis   TUBAL LIGATION  1979   Past Surgical History:  Procedure Laterality Date   49 HOUR [place] STUDY N/A 01/04/2016   Procedure: 24 HOUR PH STUDY;  Surgeon: Manus Gunning, MD;  Location: [facility];  Service: Gastroenterology;  Laterality: N/A;   ABDOMINAL HYSTERECTOMY  1988   ADENOIDECTOMY     ANTERIOR CERVICAL DECOMP/DISCECTOMY FUSION  2009, 1993   first procedure in ~ 1993/Dr [place] N/A 04/03/2019   Procedure: CARDIOVERSION;  Surgeon: Dorothy Spark, MD;  Location: [place];  Service: Cardiovascular;  Laterality: N/A;   CHOLECYSTECTOMY  2005   ESOPHAGEAL MANOMETRY N/A 01/04/2016   Procedure: ESOPHAGEAL MANOMETRY (EM) 24 HR PH;  Surgeon: Manus Gunning, MD;  Location: [facility];  Service: Gastroenterology;  Laterality: N/A;   ESOPHAGOGASTRODUODENOSCOPY (EGD) WITH PROPOFOL N/A 05/17/2016   Procedure: ESOPHAGOGASTRODUODENOSCOPY (EGD) WITH PROPOFOL;  Surgeon: Manus Gunning, MD;  Location: [facility];  Service:  Gastroenterology;  Laterality: N/A;   NASAL SINUS SURGERY  2006   SAVORY DILATION N/A 05/17/2016   Procedure: SAVORY DILATION;  Surgeon: Manus Gunning, MD;  Location: [facility];  Service: Gastroenterology;  Laterality: N/A;   TEE WITHOUT CARDIOVERSION N/A 04/03/2019   Procedure: TRANSESOPHAGEAL ECHOCARDIOGRAM (TEE)-with DCCV;  Surgeon: Dorothy Spark, MD;  Location: [facility] ENDOSCOPY;; EF 55 to 60%.  No LVH.  Normal RV.  Moderate LA dilation.  No thrombus.  Mild RA dilation.  Mild to moderate TR.  Mild aortic valve sclerosis, no stenosis.  Moderately elevated PA pressures (47 mmHg)   TONSILLECTOMY AND ADENOIDECTOMY     TRANSTHORACIC ECHOCARDIOGRAM  2009   Normal LV size and function. EF 60-65%. No regional wall motion and amount is. Mild aortic sclerosis,  no stenosis   TUBAL LIGATION  1979   Past Medical History:  Diagnosis Date   B12 deficiency    Complication of anesthesia    slow to wake up    Depression    Diabetes mellitus type 2, diet-controlled ([place]) 07/24/2018   Fibromyalgia    Generalized hyperhidrosis 04/17/2014   GERD (gastroesophageal reflux disease)    History of rheumatic fever as a child    Hyperlipemia    Hypertension    denies htn on 05/09/16   HYPOTHYROIDISM 05/29/2007   Irritable bowel syndrome    Migraines    hx of    OSA (obstructive sleep apnea) 06/26/2019   Senile calcific aortic valve sclerosis    Aortic Sclerosis-no stenosis   BP 114/69   Pulse 60   Ht 5' 5.5" (1.664 m)   Wt 203 lb 6.4 oz (92.3 kg)   SpO2 96%   BMI 33.33 kg/m   Opioid Risk Score:   Fall Risk Score:  `1  Depression screen PHQ 2/9     08/23/2021    2:14 PM 07/16/2021   10:25 AM 07/12/2021    1:20 PM 06/10/2021    2:45 PM 05/31/2021   11:20 AM 05/12/2021    1:15 PM 04/29/2021   10:55 AM  Depression screen PHQ 2/9  Decreased Interest 0 0 0 1 0 0 1  Down, Depressed, Hopeless 0 0 1 1 0 0 1  PHQ - 2 Score 0 0 1 2 0 0 2     Review of Systems  Musculoskeletal:  Positive  for back pain and neck pain.       Bilateral knee pain   All other systems reviewed and are negative.     Objective:   Physical Exam Vitals and nursing note reviewed.  Constitutional:      Appearance: Normal appearance.  Cardiovascular:     Rate and Rhythm: Normal rate and regular rhythm.     Pulses: Normal pulses.     Heart sounds: Normal heart sounds.  Pulmonary:     Effort: Pulmonary effort is normal.     Breath sounds: Normal breath sounds.  Musculoskeletal:     Cervical back: Normal range of motion and neck supple.     Right lower leg: Edema present.     Left lower leg: Edema present.     Comments: Normal Muscle Bulk and Muscle Testing Reveals:  Upper Extremities: Full ROM and Muscle Strength  5/5 Left  AC Joint Tenderness Thoracic Paraspinal Tenderness: T-4-T-6 Lower Extremities: Full ROM and Muscle Strength 5/5 Arises from Chair with ease Narrow Based  Gait     Skin:    General: Skin is warm and dry.  Neurological:     Mental Status: She is alert and oriented to person, place, and time.  Psychiatric:        Mood and Affect: Mood normal.        Behavior: Behavior normal.         Assessment & Plan:  1. Cervical postlaminectomy syndrome:/ Cervicalgia/ Cervical Radiculitis/ with neck and shoulder pain/ Chronic Midline Thoracic Pain.  Continue exercise routine and using heat therapy. 11/17/2021 Continue : current medication regimen. Refilled: MS Contin 15 mg one tablet three times a day #90.   We will continue the opioid monitoring program, this consists of regular clinic visits, examinations, urine drug screen, pill counts as well as use of [place] Controlled Substance Reporting system. A 12 month History has been reviewed on the [place] Controlled Substance Reporting System on 11/17/2021.   2. Fibromyalgia/ Neuropathy: Continue current medication regimen with Gabapentin, activity and exercise regime. 05/24 /2023. 3. Bilateral Knee Pain/ Mild  Degenerative Changes L>R: Scheduled for Left Knee Injection with Dr Letta Pate, she verbalizes understanding. Continue current medication regimen with Voltaren Gel. 11/17/2021. 4. Bilateral  Ankle Pain:  No complaints today. Continue HEP as tolerated. Continue to monitor.11/17/2021. 5. Muscle Spasm: Continue current medication regimen with  Robaxin. Continue to Monitor.11/17/2021.. 6. Opioid Induced Constipation: No complaints Continue to Monitor. 11/17/2021. 7. Midline Low Back Pain: Continue current medication regime, continue HEP as tolerated. 11/17/2021. 8. Right Gluteal Medius Weakness and Atrophy: No complaints Today:Continue with Hip Abduction Exercises as tolerated: Continue to Monitor. 11/17/2021. 9. Right Hip Pain/ Right Greater Trochanteric Tenderness: No complaints today. Continue to Alternate Ice and Heat Therapy. Continue to Monitor. 11/17/2021  10. Sacroiliac Joint Pain:  S/P Sacroiliac Injection with good relief noted. 11/17/2021 11. Lumbar Radiculitis: No complaints today.Continue Gabapentin. Continue HEP as tolerated. Continue current medication regimen. Continue to monitor.  11/17/2021.     F/U in 1 month

## 2021-11-19 ENCOUNTER — Encounter: Payer: Self-pay | Admitting: *Deleted

## 2021-11-19 ENCOUNTER — Telehealth: Payer: Self-pay | Admitting: *Deleted

## 2021-11-19 NOTE — Telephone Encounter (Signed)
Patient agreeable to move ablation date to June 22. Went over new instructions sent over [employer]. Verbalized agreement and understanding.

## 2021-11-24 ENCOUNTER — Encounter: Payer: Self-pay | Admitting: Registered Nurse

## 2021-11-25 ENCOUNTER — Other Ambulatory Visit
Admission: RE | Admit: 2021-11-25 | Discharge: 2021-11-25 | Disposition: A | Discharge status: Home / Self Care | Payer: Medicare Other | Attending: Cardiology | Admitting: Cardiology

## 2021-11-25 DIAGNOSIS — I483 Typical atrial flutter: Secondary | ICD-10-CM | POA: Insufficient documentation

## 2021-11-25 DIAGNOSIS — E669 Obesity, unspecified: Secondary | ICD-10-CM | POA: Diagnosis present

## 2021-11-25 DIAGNOSIS — I4819 Other persistent atrial fibrillation: Secondary | ICD-10-CM | POA: Insufficient documentation

## 2021-11-25 LAB — CBC WITH DIFFERENTIAL/PLATELET
Abs Immature Granulocytes: 0.02 10*3/uL (ref 0.00–0.07)
Basophils Absolute: 0 10*3/uL (ref 0.0–0.1)
Basophils Relative: 1 %
Eosinophils Absolute: 0.1 10*3/uL (ref 0.0–0.5)
Eosinophils Relative: 2 %
HCT: 38.8 % (ref 36.0–46.0)
Hemoglobin: 13.1 g/dL (ref 12.0–15.0)
Immature Granulocytes: 0 %
Lymphocytes Relative: 31 %
Lymphs Abs: 2 10*3/uL (ref 0.7–4.0)
MCH: 30 pg (ref 26.0–34.0)
MCHC: 33.8 g/dL (ref 30.0–36.0)
MCV: 89 fL (ref 80.0–100.0)
Monocytes Absolute: 0.3 10*3/uL (ref 0.1–1.0)
Monocytes Relative: 5 %
Neutro Abs: 4.1 10*3/uL (ref 1.7–7.7)
Neutrophils Relative %: 61 %
Platelets: 259 10*3/uL (ref 150–400)
RBC: 4.36 MIL/uL (ref 3.87–5.11)
RDW: 12.1 % (ref 11.5–15.5)
WBC: 6.6 10*3/uL (ref 4.0–10.5)
nRBC: 0 % (ref 0.0–0.2)

## 2021-11-25 LAB — BASIC METABOLIC PANEL
Anion gap: 10 (ref 5–15)
BUN: 16 mg/dL (ref 8–23)
CO2: 29 mmol/L (ref 22–32)
Calcium: 9.8 mg/dL (ref 8.9–10.3)
Chloride: 101 mmol/L (ref 98–111)
Creatinine, Ser: 0.7 mg/dL (ref 0.44–1.00)
GFR, Estimated: >60 mL/min (ref >60)
Glucose, Bld: 113 mg/dL — ABNORMAL HIGH (ref 70–99)
Potassium: 4.6 mmol/L (ref 3.5–5.1)
Sodium: 140 mmol/L (ref 135–145)

## 2021-12-01 ENCOUNTER — Telehealth: Payer: Self-pay

## 2021-12-01 NOTE — Progress Notes (Signed)
Chronic Care Management Pharmacy Assistant   Name: [name]  MRN: [id number] DOB: 12/27/1950  Reason for Encounter: CCM (Hyperlipidemia Disease State)  Recent office visits:  None since last CCM contact  Recent consult visits:  11/19/21 - Telephone - patient has ablation date of 12/16/21. 11/17/21 Alicia Sensing, NP (Phys Med and Rehab): Back Pain No med changes FU 1 month 10/27/21 Alicia Mage, MD (Cardiology): Atrial Fibrillation. No med changes 10/11/21 Colonoscopy 10/06/21 Alicia Sensing, NP (Phys Med and Rehab): Opiate Analgesic use. No changes. EKG  09/29/21 Alicia Fenton, PA (Cardiology) Paroxysmal Atrial Fibrillation Change: Dicyclomine 20 mg PRN vs. BID. Change: Clecainide Acetate 100 mg BID vs. 1x daily. Change: Mirtazapine 15 mg take 0.5 tab vs 1 tab. No other changes.   Hospital visits:  None since last CCM contact  Medications: Outpatient Encounter Medications as of 12/01/2021  Medication Sig Note   acetaminophen (TYLENOL) 650 MG CR tablet Take 650 mg by mouth in the morning and at bedtime.    B Complex Vitamins (B COMPLEX 100 PO) Take 1 tablet by mouth every morning.     Calcium Carbonate-Vitamin D (CALCIUM 600+D PO) Take 1 tablet by mouth in the morning and at bedtime.     carboxymethylcellulose (REFRESH PLUS) 0.5 % SOLN Place 1 drop into both eyes 3 (three) times daily as needed (dry eyes).     Carboxymethylcellulose Sodium (ARTIFICIAL TEARS OP) Apply 1 drop to eye as needed.    cetirizine (ZYRTEC) 10 MG tablet Take 10 mg by mouth at bedtime.     Cholecalciferol (VITAMIN D) 125 MCG (5000 UT) CAPS Take 5,000 Units by mouth every morning.     clindamycin (CLEOCIN T) 1 % lotion Apply 1 application topically daily as needed (eczema).     Coenzyme Q-10 100 MG capsule Take 100 mg by mouth every morning.     diclofenac sodium (VOLTAREN) 1 % GEL Apply 4 g topically 4 (four) times daily as needed (joint pain).     dicyclomine (BENTYL) 20 MG tablet Take 1 tablet (20 mg  total) by mouth as needed. As needed    diltiazem (CARDIZEM CD) 240 MG 24 hr capsule TAKE 1 CAPSULE BY MOUTH EVERY DAY    diltiazem (CARDIZEM) 30 MG tablet Take one tablet by mouth every 4 hours as needed for heart rate greater than 100, top number blood pressure needs to be above 100    docusate sodium (COLACE) 250 MG capsule Take 1 capsule by mouth daily.    ELIQUIS 5 MG TABS tablet TAKE 1 TABLET BY MOUTH  TWICE DAILY    Flaxseed, Linseed, (FLAXSEED OIL) 1000 MG CAPS Take 1,000 mg by mouth every other day. Take 1 capsule every other day.    flecainide (TAMBOCOR) 100 MG tablet Take 1 tablet (100 mg total) by mouth 2 (two) times daily.    fluocinonide (LIDEX) 0.05 % external solution Apply 1 application topically daily as needed (eczema on scalp).     gabapentin (NEURONTIN) 300 MG capsule TAKE 1 CAPSULE BY MOUTH 3  TIMES DAILY    GRAPE SEED EXTRACT PO Take 100 mg by mouth every morning.    levothyroxine (SYNTHROID) 50 MCG tablet TAKE 1 TABLET BY MOUTH  DAILY    loratadine (CLARITIN) 10 MG tablet Take 10 mg by mouth every morning.     Magnesium Bisglycinate (MAG GLYCINATE PO) Take 500 mg by mouth at bedtime.    methocarbamol (ROBAXIN) 500 MG tablet TAKE 1 TABLET BY MOUTH EVERY 8  HOURS AS NEEDED FOR MUSCLE SPASMS.    mirtazapine (REMERON) 15 MG tablet Take 1/2 tab by mouth at bedtime    morphine (MS CONTIN) 15 MG 12 hr tablet Take 1 tablet (15 mg total) by mouth 3 (three) times daily. Do Not Fill Before 11/08/2021 11/17/2021: #90 on 11/11/2021 #83 today LD 11/17/2021   oxybutynin (DITROPAN-XL) 10 MG 24 hr tablet TAKE 1 TABLET BY MOUTH AT  BEDTIME    oxymetazoline (AFRIN) 0.05 % nasal spray Place 1 spray into both nostrils 2 (two) times daily as needed for congestion.    polyethylene glycol powder (GLYCOLAX/MIRALAX) 17 GM/SCOOP powder Take 1 Container by mouth at bedtime.    RABEprazole (ACIPHEX) 20 MG tablet TAKE 1 TABLET BY MOUTH EVERY DAY    tretinoin (RETIN-A) 0.05 % cream Apply 1 application  topically daily as needed (acne).     vitamin C (ASCORBIC ACID) 500 MG tablet Take 1,000 mg by mouth daily.    Wheat Dextrin (BENEFIBER) CHEW Chew by mouth 2 (two) times daily. Takes 2 gummies 2 times a day.    No facility-administered encounter medications on file as of 12/01/2021.   Current antihyperlipidemic regimen:  None  Previous antihyperlipidemic medications tried:  rosuvastatin (leg pain)  ASCVD risk enhancing conditions: age >32, DM, HTN, and current smoker  What recent interventions/DTPs have been made by any provider to improve Cholesterol control since last CPP Visit:  No recent interventions  Any recent hospitalizations or ED visits since last visit with CPP?  No  What diet changes have been made to improve Cholesterol?  Patient has lost some weight; she is below 200 lbs. She has lost 30 lbs over the last two years. Patient is watching her diet and walking.   What exercise is being done to improve Cholesterol?  Patient is walking and doing stretches for her back.   Adherence Review: Does the patient have >5 day gap between last estimated fill dates? No  Annual wellness visit in last year? Yes 05/31/2021 Most Recent BP reading: 114/69 on 11/17/21  If Diabetic: Most recent A1C reading: 6.6 on 08/23/21 Last eye exam / retinopathy screening: 10/29/2020 Last diabetic foot exam: 11/29/2017  Upcoming appointments: CCM appointment on 03/21/2022  [facility] Medication Name/Dose: Last fill date Days supply Metformin 500 mg  09/15/20 90 Flecainide 100 mg  11/03/2021 [place], CPP notified  Marijean Niemann, [place] Assistant [phone]

## 2021-12-05 ENCOUNTER — Other Ambulatory Visit: Payer: Self-pay | Admitting: Cardiology

## 2021-12-07 ENCOUNTER — Encounter: Payer: Medicare Other | Attending: Physical Medicine & Rehabilitation | Admitting: Registered Nurse

## 2021-12-07 ENCOUNTER — Encounter: Payer: Self-pay | Admitting: Registered Nurse

## 2021-12-07 VITALS — BP 113/65 | HR 56 | Ht 65.5 in | Wt 202.0 lb

## 2021-12-07 DIAGNOSIS — M546 Pain in thoracic spine: Secondary | ICD-10-CM

## 2021-12-07 DIAGNOSIS — M25512 Pain in left shoulder: Secondary | ICD-10-CM | POA: Diagnosis not present

## 2021-12-07 DIAGNOSIS — Z5181 Encounter for therapeutic drug level monitoring: Secondary | ICD-10-CM | POA: Diagnosis not present

## 2021-12-07 DIAGNOSIS — Z79891 Long term (current) use of opiate analgesic: Secondary | ICD-10-CM | POA: Diagnosis not present

## 2021-12-07 DIAGNOSIS — M25511 Pain in right shoulder: Secondary | ICD-10-CM

## 2021-12-07 DIAGNOSIS — G8929 Other chronic pain: Secondary | ICD-10-CM

## 2021-12-07 DIAGNOSIS — M1712 Unilateral primary osteoarthritis, left knee: Secondary | ICD-10-CM | POA: Diagnosis not present

## 2021-12-07 DIAGNOSIS — G894 Chronic pain syndrome: Secondary | ICD-10-CM

## 2021-12-07 DIAGNOSIS — M542 Cervicalgia: Secondary | ICD-10-CM | POA: Diagnosis not present

## 2021-12-07 MED ORDER — MORPHINE SULFATE ER 15 MG PO TBCR: 15.0000 mg | EXTENDED_RELEASE_TABLET | Freq: Three times a day (TID) | ORAL | 0 refills | Status: DC

## 2021-12-07 NOTE — Progress Notes (Signed)
Subjective:    Patient ID: [name], female    DOB: [birth year], 71 y.o.   MRN: [id number]  HPI: [name] is a 71 y.o. female who returns for follow up appointment for chronic pain and medication refill. She states her pain is located in her neck, bilateral shoulders, mid- back and left knee pain. She  rates her pain 7. Her current exercise regime is walking short distances  [name] states her husband will begin chemotherapy soon, emotional support given.   [name] Morphine equivalent is 45.00 MME.   Last UDS was Performed on 10/06/2021, it was consistent.    Pain Inventory Average Pain 6 Pain Right Now 7 My pain is constant, sharp, burning, and aching  In the last 24 hours, has pain interfered with the following? General activity 6 Relation with others 5 Enjoyment of life 4 What TIME of day is your pain at its worst? evening Sleep (in general) Good  Pain is worse with: walking, standing, and some activites Pain improves with: rest, heat/ice, medication, and injections Relief from Meds: 4  Family History  Problem Relation Age of Onset   Breast cancer Mother    Diabetes Mother    Heart disease Mother    Allergies Mother    Diabetes Father    Heart disease Father    Emphysema Father    Allergies Father    Breast cancer Paternal Aunt    Colon cancer Neg Hx    Esophageal cancer Neg Hx    Rectal cancer Neg Hx    Stomach cancer Neg Hx    Social History   Socioeconomic History   Marital status: Married    Spouse name: Not on file   Number of children: Not on file   Years of education: Not on file   Highest education level: Not on file  Occupational History   Occupation: Radiation protection practitioner   Occupation: retired  Tobacco Use   Smoking status: Never   Smokeless tobacco: Never  Vaping Use   Vaping Use: Never used  Substance and Sexual Activity   Alcohol use: Yes    Alcohol/week: 1.0 standard drink of alcohol    Types: 1 Glasses of wine per week    Comment:  rare   Drug use: Never   Sexual activity: Not Currently  Other Topics Concern   Not on file  Social History Narrative   Not on file   Social Determinants of Health   Financial Resource Strain: Low Risk  (05/31/2021)   Overall Financial Resource Strain (CARDIA)    Difficulty of Paying Living Expenses: Not hard at all  Food Insecurity: No Food Insecurity (05/31/2021)   Hunger Vital Sign    Worried About Running Out of Food in the Last Year: Never true    Ran Out of Food in the Last Year: Never true  Transportation Needs: No Transportation Needs (05/31/2021)   PRAPARE - Hydrologist (Medical): No    Lack of Transportation (Non-Medical): No  Physical Activity: Sufficiently Active (05/31/2021)   Exercise Vital Sign    Days of Exercise per Week: 7 days    Minutes of Exercise per Session: 30 min  Stress: No Stress Concern Present (05/31/2021)   [place]    Feeling of Stress : Not at all  Social Connections: [place] (05/31/2021)   Social Connection and Isolation Panel [NHANES]    Frequency of Communication with Friends  and Family: Three times a week    Frequency of Social Gatherings with Friends and Family: More than three times a week    Attends Religious Services: More than 4 times per year    Active Member of Clubs or Organizations: Yes    Attends Archivist Meetings: More than 4 times per year    Marital Status: Married   Past Surgical History:  Procedure Laterality Date   24 HOUR [place] STUDY N/A 01/04/2016   Procedure: 24 HOUR PH STUDY;  Surgeon: Manus Gunning, MD;  Location: [facility];  Service: Gastroenterology;  Laterality: N/A;   ABDOMINAL HYSTERECTOMY  1988   ADENOIDECTOMY     ANTERIOR CERVICAL DECOMP/DISCECTOMY FUSION  2009, 1993   first procedure in ~ 1993/Dr [place] N/A 04/03/2019   Procedure:  CARDIOVERSION;  Surgeon: Dorothy Spark, MD;  Location: [place];  Service: Cardiovascular;  Laterality: N/A;   CHOLECYSTECTOMY  2005   ESOPHAGEAL MANOMETRY N/A 01/04/2016   Procedure: ESOPHAGEAL MANOMETRY (EM) 24 HR PH;  Surgeon: Manus Gunning, MD;  Location: [facility];  Service: Gastroenterology;  Laterality: N/A;   ESOPHAGOGASTRODUODENOSCOPY (EGD) WITH PROPOFOL N/A 05/17/2016   Procedure: ESOPHAGOGASTRODUODENOSCOPY (EGD) WITH PROPOFOL;  Surgeon: Manus Gunning, MD;  Location: [facility];  Service: Gastroenterology;  Laterality: N/A;   NASAL SINUS SURGERY  2006   SAVORY DILATION N/A 05/17/2016   Procedure: SAVORY DILATION;  Surgeon: Manus Gunning, MD;  Location: [facility];  Service: Gastroenterology;  Laterality: N/A;   TEE WITHOUT CARDIOVERSION N/A 04/03/2019   Procedure: TRANSESOPHAGEAL ECHOCARDIOGRAM (TEE)-with DCCV;  Surgeon: Dorothy Spark, MD;  Location: [facility];; EF 55 to 60%.  No LVH.  Normal RV.  Moderate LA dilation.  No thrombus.  Mild RA dilation.  Mild to moderate TR.  Mild aortic valve sclerosis, no stenosis.  Moderately elevated PA pressures (47 mmHg)   TONSILLECTOMY AND ADENOIDECTOMY     TRANSTHORACIC ECHOCARDIOGRAM  2009   Normal LV size and function. EF 60-65%. No regional wall motion and amount is. Mild aortic sclerosis, no stenosis   TUBAL LIGATION  1979   Past Surgical History:  Procedure Laterality Date   44 HOUR [place] STUDY N/A 01/04/2016   Procedure: 24 HOUR PH STUDY;  Surgeon: Manus Gunning, MD;  Location: [facility];  Service: Gastroenterology;  Laterality: N/A;   ABDOMINAL HYSTERECTOMY  1988   ADENOIDECTOMY     ANTERIOR CERVICAL DECOMP/DISCECTOMY FUSION  2009, 1993   first procedure in ~ 1993/Dr H[place] N/A 04/03/2019   Procedure: CARDIOVERSION;  Surgeon: Dorothy Spark, MD;  Location: [place];  Service: Cardiovascular;  Laterality: N/A;    CHOLECYSTECTOMY  2005   ESOPHAGEAL MANOMETRY N/A 01/04/2016   Procedure: ESOPHAGEAL MANOMETRY (EM) 24 HR PH;  Surgeon: Manus Gunning, MD;  Location: [facility];  Service: Gastroenterology;  Laterality: N/A;   ESOPHAGOGASTRODUODENOSCOPY (EGD) WITH PROPOFOL N/A 05/17/2016   Procedure: ESOPHAGOGASTRODUODENOSCOPY (EGD) WITH PROPOFOL;  Surgeon: Manus Gunning, MD;  Location: [facility];  Service: Gastroenterology;  Laterality: N/A;   NASAL SINUS SURGERY  2006   SAVORY DILATION N/A 05/17/2016   Procedure: SAVORY DILATION;  Surgeon: Manus Gunning, MD;  Location: [facility];  Service: Gastroenterology;  Laterality: N/A;   TEE WITHOUT CARDIOVERSION N/A 04/03/2019   Procedure: TRANSESOPHAGEAL ECHOCARDIOGRAM (TEE)-with DCCV;  Surgeon: Dorothy Spark,  MD;  Location: [facility];; EF 55 to 60%.  No LVH.  Normal RV.  Moderate LA dilation.  No thrombus.  Mild RA dilation.  Mild to moderate TR.  Mild aortic valve sclerosis, no stenosis.  Moderately elevated PA pressures (47 mmHg)   TONSILLECTOMY AND ADENOIDECTOMY     TRANSTHORACIC ECHOCARDIOGRAM  2009   Normal LV size and function. EF 60-65%. No regional wall motion and amount is. Mild aortic sclerosis, no stenosis   TUBAL LIGATION  1979   Past Medical History:  Diagnosis Date   B12 deficiency    Complication of anesthesia    slow to wake up    Depression    Diabetes mellitus type 2, diet-controlled ([place]) 07/24/2018   Fibromyalgia    Generalized hyperhidrosis 04/17/2014   GERD (gastroesophageal reflux disease)    History of rheumatic fever as a child    Hyperlipemia    Hypertension    denies htn on 05/09/16   HYPOTHYROIDISM 05/29/2007   Irritable bowel syndrome    Migraines    hx of    OSA (obstructive sleep apnea) 06/26/2019   Senile calcific aortic valve sclerosis    Aortic Sclerosis-no stenosis   BP 113/65   Pulse (!) 56   Ht 5' 5.5" (1.664 m)   Wt 202 lb (91.6 kg)   SpO2 98%   BMI 33.10 kg/m   Opioid  Risk Score:   Fall Risk Score:  `1  Depression screen [facility] 2/9     12/07/2021   11:39 AM 08/23/2021    2:14 PM 07/16/2021   10:25 AM 07/12/2021    1:20 PM 06/10/2021    2:45 PM 05/31/2021   11:20 AM 05/12/2021    1:15 PM  Depression screen PHQ 2/9  Decreased Interest 0 0 0 0 1 0 0  Down, Depressed, Hopeless 0 0 0 1 1 0 0  PHQ - 2 Score 0 0 0 1 2 0 0      Review of Systems  Musculoskeletal:        Left knee pain  All other systems reviewed and are negative.     Objective:   Physical Exam Vitals and nursing note reviewed.  Constitutional:      Appearance: Normal appearance.  Neck:     Comments: Cervical Paraspinal Tenderness: C-5-C-6 Cardiovascular:     Rate and Rhythm: Normal rate and regular rhythm.     Pulses: Normal pulses.     Heart sounds: Normal heart sounds.  Pulmonary:     Effort: Pulmonary effort is normal.     Breath sounds: Normal breath sounds.  Musculoskeletal:     Cervical back: Normal range of motion and neck supple.     Comments: Normal Muscle Bulk and Muscle Testing Reveals:  Upper Extremities:Full  ROM and Muscle Strength 5/5  Lower Extremities Full ROM and Muscle Strength 5/5 Left Lower Extremity Flexion Produces Pain into her Left Patella Arises from Chair Slowly Narrow Based Gait     Skin:    General: Skin is warm and dry.  Neurological:     Mental Status: She is alert and oriented to person, place, and time.  Psychiatric:        Mood and Affect: Mood normal.        Behavior: Behavior normal.         Assessment & Plan:  1. Cervical postlaminectomy syndrome:/ Cervicalgia/ Cervical Radiculitis/ with neck and shoulder pain/ Chronic Midline Thoracic Pain.  Continue exercise routine and using heat therapy.  12/07/2021 Continue : current medication regimen. Refilled: MS Contin 15 mg one tablet three times a day #90.   We will continue the opioid monitoring program, this consists of regular clinic visits, examinations, urine drug screen, pill  counts as well as use of [place] Controlled Substance Reporting system. A 12 month History has been reviewed on the [place] on 12/07/2021.   2. Fibromyalgia/ Neuropathy: Continue current medication regimen with Gabapentin, activity and exercise regime. 12/07/2021. 3. Bilateral Knee Pain/ Mild Degenerative Changes L>R: S/P Left Knee Injection on 07/16/2021, with good relief noted. She will be scheduled for Left Knee Injection with Dr Letta Pate, she verbalizes understanding. Continue current medication regimen with Voltaren Gel. 12/07/2021. 4. Bilateral  Ankle Pain:  No complaints today. Continue HEP as tolerated. Continue to monitor.12/07/2021. 5. Muscle Spasm: Continue current medication regimen with  Robaxin. Continue to Monitor.12/07/2021.. 6. Opioid Induced Constipation: No complaints Continue to Monitor. 12/07/2021. 7. Midline Low Back Pain: Continue current medication regime, continue HEP as tolerated. 12/07/2021. 8. Right Gluteal Medius Weakness and Atrophy: No complaints Today:Continue with Hip Abduction Exercises as tolerated: Continue to Monitor. 12/07/2021. 9. Right Hip Pain/ Right Greater Trochanteric Tenderness: No complaints today. Continue to Alternate Ice and Heat Therapy. Continue to Monitor. 12/07/2021  10. Sacroiliac Joint Pain:  S/P Sacroiliac Injection with good relief noted. 12/07/2021 11. Lumbar Radiculitis: No complaints today.Continue Gabapentin. Continue HEP as tolerated. Continue current medication regimen. Continue to monitor.  12/07/2021.     F/U in 1 month

## 2021-12-15 NOTE — Pre-Procedure Instructions (Signed)
Instructed patient on the following items: Arrival time 0830 Nothing to eat or drink after midnight No meds AM of procedure Responsible person to drive you home and stay with you for 24 hrs  Have you missed any doses of anti-coagulant Eliquis- hasn't missed any doses   

## 2021-12-16 ENCOUNTER — Ambulatory Visit (HOSPITAL_COMMUNITY): Payer: Medicare Other | Admitting: Anesthesiology

## 2021-12-16 ENCOUNTER — Encounter (HOSPITAL_COMMUNITY)
Admission: RE | Disposition: A | Discharge status: Home / Self Care | Payer: Medicare Other | Source: Home / Self Care | Attending: Cardiology

## 2021-12-16 ENCOUNTER — Encounter (HOSPITAL_COMMUNITY): Payer: Self-pay | Admitting: Cardiology

## 2021-12-16 ENCOUNTER — Other Ambulatory Visit (HOSPITAL_COMMUNITY): Payer: Self-pay

## 2021-12-16 ENCOUNTER — Other Ambulatory Visit: Payer: Self-pay

## 2021-12-16 ENCOUNTER — Ambulatory Visit (HOSPITAL_BASED_OUTPATIENT_CLINIC_OR_DEPARTMENT_OTHER): Payer: Medicare Other | Admitting: Anesthesiology

## 2021-12-16 ENCOUNTER — Ambulatory Visit (HOSPITAL_COMMUNITY)
Admission: RE | Admit: 2021-12-16 | Discharge: 2021-12-16 | Disposition: A | Discharge status: Home / Self Care | Payer: Medicare Other | Attending: Cardiology | Admitting: Cardiology

## 2021-12-16 DIAGNOSIS — I483 Typical atrial flutter: Secondary | ICD-10-CM | POA: Insufficient documentation

## 2021-12-16 DIAGNOSIS — E669 Obesity, unspecified: Secondary | ICD-10-CM | POA: Insufficient documentation

## 2021-12-16 DIAGNOSIS — E119 Type 2 diabetes mellitus without complications: Secondary | ICD-10-CM | POA: Diagnosis not present

## 2021-12-16 DIAGNOSIS — I4819 Other persistent atrial fibrillation: Secondary | ICD-10-CM | POA: Diagnosis not present

## 2021-12-16 DIAGNOSIS — G4733 Obstructive sleep apnea (adult) (pediatric): Secondary | ICD-10-CM | POA: Insufficient documentation

## 2021-12-16 DIAGNOSIS — I4891 Unspecified atrial fibrillation: Secondary | ICD-10-CM

## 2021-12-16 DIAGNOSIS — I10 Essential (primary) hypertension: Secondary | ICD-10-CM

## 2021-12-16 DIAGNOSIS — E039 Hypothyroidism, unspecified: Secondary | ICD-10-CM

## 2021-12-16 DIAGNOSIS — Z6834 Body mass index (BMI) 34.0-34.9, adult: Secondary | ICD-10-CM | POA: Diagnosis not present

## 2021-12-16 HISTORY — PX: ATRIAL FIBRILLATION ABLATION: EP1191

## 2021-12-16 LAB — POCT ACTIVATED CLOTTING TIME
Activated Clotting Time: 378 seconds
Activated Clotting Time: 347 seconds

## 2021-12-16 SURGERY — ATRIAL FIBRILLATION ABLATION
Anesthesia: General

## 2021-12-16 MED ORDER — SODIUM CHLORIDE 0.9 % IV SOLN: 250.0000 mL | INTRAVENOUS | Status: DC | PRN

## 2021-12-16 MED ORDER — ROCURONIUM BROMIDE 10 MG/ML (PF) SYRINGE
PREFILLED_SYRINGE | INTRAVENOUS | Status: DC | PRN
  Administered 2021-12-16: 60 mg via INTRAVENOUS

## 2021-12-16 MED ORDER — APIXABAN 5 MG PO TABS
5.0000 mg | ORAL_TABLET | Freq: Two times a day (BID) | ORAL | Status: DC
  Administered 2021-12-16: 5 mg via ORAL
  Filled 2021-12-16: qty 1

## 2021-12-16 MED ORDER — SUGAMMADEX SODIUM 200 MG/2ML IV SOLN
INTRAVENOUS | Status: DC | PRN
  Administered 2021-12-16: 150 mg via INTRAVENOUS

## 2021-12-16 MED ORDER — ONDANSETRON HCL 4 MG/2ML IJ SOLN
INTRAMUSCULAR | Status: DC | PRN
  Administered 2021-12-16: 4 mg via INTRAVENOUS

## 2021-12-16 MED ORDER — PROTAMINE SULFATE 10 MG/ML IV SOLN
INTRAVENOUS | Status: DC | PRN
  Administered 2021-12-16: 35 mg via INTRAVENOUS

## 2021-12-16 MED ORDER — MORPHINE SULFATE ER 15 MG PO TBCR
15.0000 mg | EXTENDED_RELEASE_TABLET | Freq: Once | ORAL | Status: AC
  Administered 2021-12-16: 15 mg via ORAL
  Filled 2021-12-16: qty 1

## 2021-12-16 MED ORDER — PANTOPRAZOLE SODIUM 40 MG PO TBEC
40.0000 mg | DELAYED_RELEASE_TABLET | Freq: Every day | ORAL | 0 refills | Status: DC
  Filled 2021-12-16: qty 45, 45d supply, fill #0

## 2021-12-16 MED ORDER — HEPARIN SODIUM (PORCINE) 1000 UNIT/ML IJ SOLN
INTRAMUSCULAR | Status: DC | PRN
  Administered 2021-12-16: 1000 [IU] via INTRAVENOUS

## 2021-12-16 MED ORDER — HEPARIN SODIUM (PORCINE) 1000 UNIT/ML IJ SOLN
INTRAMUSCULAR | Status: DC | PRN
  Administered 2021-12-16: 14000 [IU] via INTRAVENOUS

## 2021-12-16 MED ORDER — SODIUM CHLORIDE 0.9% FLUSH: 3.0000 mL | INTRAVENOUS | Status: DC | PRN

## 2021-12-16 MED ORDER — ONDANSETRON HCL 4 MG/2ML IJ SOLN: 4.0000 mg | Freq: Four times a day (QID) | INTRAMUSCULAR | Status: DC | PRN

## 2021-12-16 MED ORDER — HEPARIN SODIUM (PORCINE) 1000 UNIT/ML IJ SOLN
INTRAMUSCULAR | Status: AC
  Filled 2021-12-16: qty 10

## 2021-12-16 MED ORDER — LIDOCAINE 2% (20 MG/ML) 5 ML SYRINGE
INTRAMUSCULAR | Status: DC | PRN
  Administered 2021-12-16: 60 mg via INTRAVENOUS

## 2021-12-16 MED ORDER — PANTOPRAZOLE SODIUM 40 MG PO TBEC
40.0000 mg | DELAYED_RELEASE_TABLET | Freq: Every day | ORAL | Status: DC
  Administered 2021-12-16: 40 mg via ORAL
  Filled 2021-12-16: qty 1

## 2021-12-16 MED ORDER — HEPARIN (PORCINE) IN NACL 1000-0.9 UT/500ML-% IV SOLN
INTRAVENOUS | Status: AC
  Filled 2021-12-16: qty 2000

## 2021-12-16 MED ORDER — PHENYLEPHRINE HCL-NACL 20-0.9 MG/250ML-% IV SOLN
INTRAVENOUS | Status: DC | PRN
  Administered 2021-12-16: 25 ug/min via INTRAVENOUS

## 2021-12-16 MED ORDER — SODIUM CHLORIDE 0.9% FLUSH: 3.0000 mL | Freq: Two times a day (BID) | INTRAVENOUS | Status: DC

## 2021-12-16 MED ORDER — COLCHICINE 0.6 MG PO TABS
0.6000 mg | ORAL_TABLET | Freq: Two times a day (BID) | ORAL | Status: DC
  Administered 2021-12-16: 0.6 mg via ORAL
  Filled 2021-12-16: qty 1

## 2021-12-16 MED ORDER — PROPOFOL 10 MG/ML IV BOLUS
INTRAVENOUS | Status: DC | PRN
  Administered 2021-12-16: 150 mg via INTRAVENOUS

## 2021-12-16 MED ORDER — FENTANYL CITRATE (PF) 100 MCG/2ML IJ SOLN
INTRAMUSCULAR | Status: DC | PRN
Start: 2021-12-16 — End: 2021-12-16
  Administered 2021-12-16 (×2): 50 ug via INTRAVENOUS

## 2021-12-16 MED ORDER — HEPARIN (PORCINE) IN NACL 1000-0.9 UT/500ML-% IV SOLN
INTRAVENOUS | Status: DC | PRN
  Administered 2021-12-16 (×4): 500 mL

## 2021-12-16 MED ORDER — SODIUM CHLORIDE 0.9 % IV SOLN: INTRAVENOUS | Status: DC

## 2021-12-16 MED ORDER — DEXAMETHASONE SODIUM PHOSPHATE 4 MG/ML IJ SOLN
INTRAMUSCULAR | Status: DC | PRN
Start: 2021-12-16 — End: 2021-12-16
  Administered 2021-12-16: 4 mg via INTRAVENOUS

## 2021-12-16 MED ORDER — COLCHICINE 0.6 MG PO TABS
0.6000 mg | ORAL_TABLET | Freq: Two times a day (BID) | ORAL | 0 refills | Status: DC
  Filled 2021-12-16: qty 10, 5d supply, fill #0

## 2021-12-16 SURGICAL SUPPLY — 18 items
CATH ABLAT QDOT MICRO BI TC DF (CATHETERS) ×1 IMPLANT
CATH OCTARAY 2.0 F 3-3-3-3-3 (CATHETERS) ×1 IMPLANT
CATH S-M CIRCA TEMP PROBE (CATHETERS) IMPLANT
CATH SOUNDSTAR ECO 8FR (CATHETERS) ×1 IMPLANT
CATH WEBSTER BI DIR CS D-F CRV (CATHETERS) ×1 IMPLANT
CATH-S M ESOPHAGEAL TEMP PROBE (CATHETERS) ×1
CLOSURE PERCLOSE PROSTYLE (VASCULAR PRODUCTS) ×3 IMPLANT
COVER SWIFTLINK CONNECTOR (BAG) ×2 IMPLANT
PACK EP LATEX FREE (CUSTOM PROCEDURE TRAY) ×2
PACK EP LF (CUSTOM PROCEDURE TRAY) ×1 IMPLANT
PAD DEFIB RADIO PHYSIO CONN (PAD) ×2 IMPLANT
PATCH CARTO3 (PAD) ×1 IMPLANT
SHEATH BAYLIS TRANSSEPTAL 98CM (NEEDLE) ×1 IMPLANT
SHEATH CARTO VIZIGO SM CVD (SHEATH) ×1 IMPLANT
SHEATH PINNACLE 8F 10CM (SHEATH) ×2 IMPLANT
SHEATH PINNACLE 9F 10CM (SHEATH) ×1 IMPLANT
SHEATH PROBE COVER 6X72 (BAG) ×1 IMPLANT
TUBING SMART ABLATE COOLFLOW (TUBING) ×1 IMPLANT

## 2021-12-16 NOTE — Transfer of Care (Signed)
Immediate Anesthesia Transfer of Care Note  Patient: [name]  Procedure(s) Performed: ATRIAL FIBRILLATION ABLATION  Patient Location: Cath Lab  Anesthesia Type:General  Level of Consciousness: awake  Airway & Oxygen Therapy: Patient Spontanous Breathing and Patient connected to nasal cannula oxygen  Post-op Assessment: Report given to RN and Post -op Vital signs reviewed and stable  Post vital signs: Reviewed and stable  Last Vitals:  Vitals Value Taken Time  BP 127/38 12/16/21 1240  Temp    Pulse 75 12/16/21 1240  Resp 12 12/16/21 1240  SpO2 99 % 12/16/21 1240  Vitals shown include unvalidated device data.  Last Pain:  Vitals:   12/16/21 0904  TempSrc: Oral         Complications: No notable events documented.

## 2021-12-16 NOTE — H&P (Signed)
Electrophysiology Office Note:     Date:  12/16/2021    ID:  CAROLANN BRAZELL, DOB 1951-06-04, MRN [id number]   PCP:  Owens Loffler, MD        [facility] Cardiologist:  Glenetta Hew, MD  [facility] Electrophysiologist:  None    Referring MD: Owens Loffler, MD    Chief Complaint: Atrial fibrillation   History of Present Illness:     [name] is a 71 y.o. female who presents for an evaluation of atrial fibrillation at the request of Adline Peals, PA-C. Their medical history includes fibromyalgia, diabetes, atrial flutter, hypertension, obstructive sleep apnea, atrial fibrillation.  The patient was last seen by Adline Peals on September 29, 2021. The patient also is being evaluated by Dr. Havery Moros for bright red blood per rectum.  The patient is previously used flecainide for rhythm control.  She is also on diltiazem.  At the last appointment with [facility], the patient asked about and so she presents today to discuss this is a potential stroke risk mitigation strategy.   Today she tells me she is highly symptomatic when in atrial fibrillation.  Her episodes typically start with sweating, nausea and vomiting.  She will be extremely fatigued and lightheaded.  The episodes can last hours to over a day.  They happen despite treatment with flecainide.  She takes Eliquis for stroke prophylaxis.  She did have an episode of bright red blood per rectum but this was evaluated with GI.  It was thought to be secondary to an infection which is now resolved.  She has had an EGD and colonoscopy which were okay.   Patient presents today for PVI.   Objective      Past Medical History:  Diagnosis Date   B12 deficiency     Complication of anesthesia      slow to wake up    Depression     Diabetes mellitus type 2, diet-controlled ([place]) 07/24/2018   Fibromyalgia     Generalized hyperhidrosis 04/17/2014   GERD (gastroesophageal reflux disease)     History of rheumatic fever as a child      Hyperlipemia     Hypertension      denies htn on 05/09/16   HYPOTHYROIDISM 05/29/2007   Irritable bowel syndrome     Migraines      hx of    OSA (obstructive sleep apnea) 06/26/2019   Senile calcific aortic valve sclerosis      Aortic Sclerosis-no stenosis           Past Surgical History:  Procedure Laterality Date   87 HOUR [place] STUDY N/A 01/04/2016    Procedure: 24 HOUR [place] STUDY;  Surgeon: Manus Gunning, MD;  Location: [facility];  Service: Gastroenterology;  Laterality: N/A;   ABDOMINAL HYSTERECTOMY   1988   ADENOIDECTOMY       ANTERIOR CERVICAL DECOMP/DISCECTOMY FUSION   2009, 1993    first procedure in ~ 1993/Dr [place] N/A 04/03/2019    Procedure: CARDIOVERSION;  Surgeon: Dorothy Spark, MD;  Location: [facility];  Service: Cardiovascular;  Laterality: N/A;   CHOLECYSTECTOMY   2005   ESOPHAGEAL MANOMETRY N/A 01/04/2016    Procedure: ESOPHAGEAL MANOMETRY (EM) 24 HR PH;  Surgeon: Manus Gunning, MD;  Location: [facility];  Service: Gastroenterology;  Laterality: N/A;   ESOPHAGOGASTRODUODENOSCOPY (EGD) WITH PROPOFOL N/A 05/17/2016    Procedure: ESOPHAGOGASTRODUODENOSCOPY (  EGD) WITH PROPOFOL;  Surgeon: Manus Gunning, MD;  Location: [facility];  Service: Gastroenterology;  Laterality: N/A;   NASAL SINUS SURGERY   2006   SAVORY DILATION N/A 05/17/2016    Procedure: SAVORY DILATION;  Surgeon: Manus Gunning, MD;  Location: [facility];  Service: Gastroenterology;  Laterality: N/A;   TEE WITHOUT CARDIOVERSION N/A 04/03/2019    Procedure: TRANSESOPHAGEAL ECHOCARDIOGRAM (TEE)-with DCCV;  Surgeon: Dorothy Spark, MD;  Location: [facility] ENDOSCOPY;; EF 55 to 60%.  No LVH.  Normal RV.  Moderate LA dilation.  No thrombus.  Mild RA dilation.  Mild to moderate TR.  Mild aortic valve sclerosis, no stenosis.  Moderately elevated PA pressures (47 mmHg)   TONSILLECTOMY AND ADENOIDECTOMY        TRANSTHORACIC ECHOCARDIOGRAM   2009    Normal LV size and function. EF 60-65%. No regional wall motion and amount is. Mild aortic sclerosis, no stenosis   TUBAL LIGATION   1979      Current Medications: Active Medications      Current Meds  Medication Sig   acetaminophen (TYLENOL) 650 MG CR tablet Take 650 mg by mouth in the morning and at bedtime.   B Complex Vitamins (B COMPLEX 100 PO) Take 1 tablet by mouth every morning.    Calcium Carbonate-Vitamin D (CALCIUM 600+D PO) Take 1 tablet by mouth in the morning and at bedtime.    carboxymethylcellulose (REFRESH PLUS) 0.5 % SOLN Place 1 drop into both eyes 3 (three) times daily as needed (dry eyes).    Carboxymethylcellulose Sodium (ARTIFICIAL TEARS OP) Apply 1 drop to eye as needed.   cetirizine (ZYRTEC) 10 MG tablet Take 10 mg by mouth at bedtime.    Cholecalciferol (VITAMIN D) 125 MCG (5000 UT) CAPS Take 5,000 Units by mouth every morning.    clindamycin (CLEOCIN T) 1 % lotion Apply 1 application topically daily as needed (eczema).    Coenzyme Q-10 100 MG capsule Take 100 mg by mouth every morning.    diclofenac sodium (VOLTAREN) 1 % GEL Apply 4 g topically 4 (four) times daily as needed (joint pain).    dicyclomine (BENTYL) 20 MG tablet Take 1 tablet (20 mg total) by mouth as needed. As needed   diltiazem (CARDIZEM CD) 240 MG 24 hr capsule TAKE 1 CAPSULE BY MOUTH EVERY DAY   diltiazem (CARDIZEM) 30 MG tablet Take one tablet by mouth every 4 hours as needed for heart rate greater than 100, top number blood pressure needs to be above 100   docusate sodium (COLACE) 250 MG capsule Take 1 capsule by mouth daily.   ELIQUIS 5 MG TABS tablet TAKE 1 TABLET BY MOUTH  TWICE DAILY   Flaxseed, Linseed, (FLAXSEED OIL) 1000 MG CAPS Take 1,000 mg by mouth every other day. Take 1 capsule every other day.   flecainide (TAMBOCOR) 100 MG tablet Take 1 tablet (100 mg total) by mouth 2 (two) times daily.   fluocinonide (LIDEX) 0.05 % external solution Apply  1 application topically daily as needed (eczema on scalp).    gabapentin (NEURONTIN) 300 MG capsule TAKE 1 CAPSULE BY MOUTH 3  TIMES DAILY   GRAPE SEED EXTRACT PO Take 100 mg by mouth every morning.   levothyroxine (SYNTHROID) 50 MCG tablet TAKE 1 TABLET BY MOUTH  DAILY   loratadine (CLARITIN) 10 MG tablet Take 10 mg by mouth every morning.    Magnesium Bisglycinate (MAG GLYCINATE PO) Take 500 mg by mouth at bedtime.   methocarbamol (ROBAXIN) 500  MG tablet TAKE 1 TABLET BY MOUTH EVERY 8 HOURS AS NEEDED FOR MUSCLE SPASMS.   mirtazapine (REMERON) 15 MG tablet Take 1/2 tab by mouth at bedtime   morphine (MS CONTIN) 15 MG 12 hr tablet Take 1 tablet (15 mg total) by mouth 3 (three) times daily. Do Not Fill Before 11/08/2021   oxybutynin (DITROPAN-XL) 10 MG 24 hr tablet TAKE 1 TABLET BY MOUTH AT  BEDTIME   oxymetazoline (AFRIN) 0.05 % nasal spray Place 1 spray into both nostrils 2 (two) times daily as needed for congestion.   polyethylene glycol powder (GLYCOLAX/MIRALAX) 17 GM/SCOOP powder Take 1 Container by mouth at bedtime.   RABEprazole (ACIPHEX) 20 MG tablet TAKE 1 TABLET BY MOUTH EVERY DAY   tretinoin (RETIN-A) 0.05 % cream Apply 1 application topically daily as needed (acne).    vitamin C (ASCORBIC ACID) 500 MG tablet Take 1,000 mg by mouth daily.   Wheat Dextrin (BENEFIBER) CHEW Chew by mouth 2 (two) times daily. Takes 2 gummies 2 times a day.        Allergies:   Cyclobenzaprine hcl, Rizatriptan benzoate, Sumatriptan, Tizanidine, Aleve [naproxen sodium], Clarithromycin, Corn-containing products, Doxycycline, Duloxetine, Egg [eggs or egg-derived products], Keflex [cephalexin], Metronidazole, Oxycodone hcl, Oxycontin [oxycodone hcl], Penicillins, Rosuvastatin, Venlafaxine, Voltaren [diclofenac sodium], Septra [sulfamethoxazole-trimethoprim], and Tramadol    Social History         Socioeconomic History   Marital status: Married      Spouse name: Not on file   Number of children: Not on file    Years of education: Not on file   Highest education level: Not on file  Occupational History   Occupation: Radiation protection practitioner   Occupation: retired  Tobacco Use   Smoking status: Never   Smokeless tobacco: Never  Vaping Use   Vaping Use: Never used  Substance and Sexual Activity   Alcohol use: Yes      Alcohol/week: 1.0 standard drink      Types: 1 Glasses of wine per week      Comment: rare   Drug use: Never   Sexual activity: Not Currently  Other Topics Concern   Not on file  Social History Narrative   Not on file    Social Determinants of Health       Financial Resource Strain: Low Risk    Difficulty of Paying Living Expenses: Not hard at all  Food Insecurity: No Food Insecurity   Worried About Charity fundraiser in the Last Year: Never true   [place] in the Last Year: Never true  Transportation Needs: No Transportation Needs   Lack of Transportation (Medical): No   Lack of Transportation (Non-Medical): No  Physical Activity: Sufficiently Active   Days of Exercise per Week: 7 days   Minutes of Exercise per Session: 30 min  Stress: No Stress Concern Present   Feeling of Stress : Not at all  Social Connections: Socially Integrated   Frequency of Communication with Friends and Family: Three times a week   Frequency of Social Gatherings with Friends and Family: More than three times a week   Attends Religious Services: More than 4 times per year   Active Member of [employer] or Organizations: Yes   Attends Music therapist: More than 4 times per year   Marital Status: Married      Family History: The patient's family history includes Allergies in her father and mother; Breast cancer in her mother and paternal aunt; Diabetes in her father  and mother; Emphysema in her father; Heart disease in her father and mother. There is no history of Colon cancer, Esophageal cancer, Rectal cancer, or Stomach cancer.   ROS:   Please see the history of present illness.     All other systems reviewed and are negative.   EKGs/Labs/Other Studies Reviewed:     The following studies were reviewed today:   April 03, 2019 transesophageal echo Normal left ventricular function Normal right ventricular function Moderately dilated left atrium No left atrial appendage thrombus. Small chicken wing morphology         Recent Labs: 08/23/2021: ALT 23; TSH 4.09 09/10/2021: BUN 14; Creatinine, Ser 0.88; Hemoglobin 14.3; Platelets 281; Potassium 4.1; Sodium 136  Recent Lipid Panel Labs (Brief)          Component Value Date/Time    CHOL 196 08/23/2021 1129    TRIG 233.0 (H) 08/23/2021 1129    HDL 45.80 08/23/2021 1129    CHOLHDL 4 08/23/2021 1129    VLDL 46.6 (H) 08/23/2021 1129    LDLCALC 108 (H) 03/12/2020 1325    LDLDIRECT 132.0 08/23/2021 1129        Physical Exam:     VS:  BP 144/61   Pulse 41 Ht 5' 5.5" (1.664 m)   Wt 199 lb 6.4 oz (90.4 kg)   SpO2 96%   BMI 32.68 kg/m         Wt Readings from Last 3 Encounters:  10/27/21 199 lb 6.4 oz (90.4 kg)  10/11/21 202 lb (91.6 kg)  10/06/21 202 lb (91.6 kg)      GEN:  Well nourished, well developed in no acute distress HEENT: Normal NECK: No JVD; No carotid bruits LYMPHATICS: No lymphadenopathy CARDIAC: RRR, no murmurs, rubs, gallops RESPIRATORY:  Clear to auscultation without rales, wheezing or rhonchi  ABDOMEN: Soft, non-tender, non-distended MUSCULOSKELETAL:  No edema; No deformity  SKIN: Warm and dry NEUROLOGIC:  Alert and oriented x 3 PSYCHIATRIC:  Normal affect          Assessment ASSESSMENT:     1. Persistent atrial fibrillation ([place])   2. Typical atrial flutter ([facility])   3. Obesity (BMI 30-39.9)     PLAN:     In order of problems listed above:   #Persistent atrial fibrillation #Typical atrial flutter Highly symptomatic despite treatment with flecainide.  On Eliquis for stroke prophylaxis.   Discussed treatment options today for his AF including antiarrhythmic drug  therapy and ablation. Discussed risks, recovery and likelihood of success. Discussed potential need for repeat ablation procedures and antiarrhythmic drugs after an initial ablation. They wish to proceed with scheduling.   Risk, benefits, and alternatives to EP study and radiofrequency ablation for afib were also discussed in detail today. These risks include but are not limited to stroke, bleeding, vascular damage, tamponade, perforation, damage to the esophagus, lungs, and other structures, pulmonary vein stenosis, worsening renal function, and death. The patient understands these risk and wishes to proceed.  We will therefore proceed with catheter ablation at the next available time.  Carto, ICE, anesthesia are requested for the procedure.    She does not need a CT scan.  She had a CTA coronary from January 2020 which shows the left atrial anatomy.   Ablation strategy will be PVI plus posterior wall plus CTI.        Signed, Hilton Cork. Quentin Ore, MD, [facility], [facility] 12/16/2021 Electrophysiology [facility]

## 2021-12-16 NOTE — Discharge Instructions (Addendum)
Post procedure care instructions No driving for 4 days. No lifting over 5 lbs for 1 week. No vigorous or sexual activity for 1 week. You may return to work/your usual activities on 12/24/21. Keep procedure site clean & dry. If you notice increased pain, swelling, bleeding or pus, call/return!  You may shower after 24 hours, but no soaking in baths/hot tubs/pools for 1 week.    You have an appointment set up with the [facility].  Multiple studies have shown that being followed by a dedicated atrial fibrillation clinic in addition to the standard care you receive from your other physicians improves health. We believe that enrollment in the atrial fibrillation clinic will allow [place] to better care for you.   The phone number to the [facility] is [phone]. The clinic is staffed Monday through Friday from 8:30am to 5pm.  Parking Directions: The clinic is located in the Heart and Vascular Building connected to [facility]. 1)From [address] turn on to [address] and go to the 3rd entrance  (Heart and Vascular entrance) on the right. 2)Look to the right for Heart &Vascular Parking Garage. 3)A code for the entrance is required, for July is 1404.   4)Take the elevators to the 1st floor. Registration is in the room with the glass walls at the end of the hallway.  If you have any trouble parking or locating the clinic, please don't hesitate to call [phone].  Cardiac Ablation, Care After  This sheet gives you information about how to care for yourself after your procedure. Your health care provider may also give you more specific instructions. If you have problems or questions, contact your health care provider. What can I expect after the procedure? After the procedure, it is common to have: Bruising around your puncture site. Tenderness around your puncture site. Skipped heartbeats. Tiredness (fatigue).  Follow these instructions at  home: Puncture site care  Follow instructions from your health care provider about how to take care of your puncture site. Make sure you: If present, leave stitches (sutures), skin glue, or adhesive strips in place. These skin closures may need to stay in place for up to 2 weeks. If adhesive strip edges start to loosen and curl up, you may trim the loose edges. Do not remove adhesive strips completely unless your health care provider tells you to do that. If a large square bandage is present, this may be removed 24 hours after surgery.  Check your puncture site every day for signs of infection. Check for: Redness, swelling, or pain. Fluid or blood. If your puncture site starts to bleed, lie down on your back, apply firm pressure to the area, and contact your health care provider. Warmth. Pus or a bad smell. A pea or small marble sized lump at the site is normal and can take up to three months to resolve.  Driving Do not drive for at least 4 days after your procedure or however long your health care provider recommends. (Do not resume driving if you have previously been instructed not to drive for other health reasons.) Do not drive or use heavy machinery while taking prescription pain medicine. Activity Avoid activities that take a lot of effort for at least 7 days after your procedure. Do not lift anything that is heavier than 5 lb (4.5 kg) for one week.  No sexual activity for 1 week.  Return to your normal activities as told by your health care provider. Ask your health care  provider what activities are safe for you. General instructions Take over-the-counter and prescription medicines only as told by your health care provider. Do not use any products that contain nicotine or tobacco, such as cigarettes and e-cigarettes. If you need help quitting, ask your health care provider. You may shower after 24 hours, but Do not take baths, swim, or use a hot tub for 1 week.  Do not drink alcohol for  24 hours after your procedure. Keep all follow-up visits as told by your health care provider. This is important. Contact a health care provider if: You have redness, mild swelling, or pain around your puncture site. You have fluid or blood coming from your puncture site that stops after applying firm pressure to the area. Your puncture site feels warm to the touch. You have pus or a bad smell coming from your puncture site. You have a fever. You have chest pain or discomfort that spreads to your neck, jaw, or arm. You are sweating a lot. You feel nauseous. You have a fast or irregular heartbeat. You have shortness of breath. You are dizzy or light-headed and feel the need to lie down. You have pain or numbness in the arm or leg closest to your puncture site. Get help right away if: Your puncture site suddenly swells. Your puncture site is bleeding and the bleeding does not stop after applying firm pressure to the area. These symptoms may represent a serious problem that is an emergency. Do not wait to see if the symptoms will go away. Get medical help right away. Call your local emergency services (911 in the [place] Do not drive yourself to the hospital. Summary After the procedure, it is normal to have bruising and tenderness at the puncture site in your groin, neck, or forearm. Check your puncture site every day for signs of infection. Get help right away if your puncture site is bleeding and the bleeding does not stop after applying firm pressure to the area. This is a medical emergency. This information is not intended to replace advice given to you by your health care provider. Make sure you discuss any questions you have with your health care provider.

## 2021-12-16 NOTE — Anesthesia Procedure Notes (Addendum)
Procedure Name: Intubation Date/Time: 12/16/2021 10:31 AM  Performed by: Lieutenant Diego, CRNAPre-anesthesia Checklist: Patient identified, Emergency Drugs available, Suction available and Patient being monitored Patient Re-evaluated:Patient Re-evaluated prior to induction Oxygen Delivery Method: Circle system utilized Preoxygenation: Pre-oxygenation with 100% oxygen Induction Type: IV induction Ventilation: Mask ventilation without difficulty Laryngoscope Size: Miller and 2 Grade View: Grade I Tube type: Oral Tube size: 7.0 mm Number of attempts: 1 Airway Equipment and Method: Stylet Placement Confirmation: ETT inserted through vocal cords under direct vision, positive ETCO2 and breath sounds checked- equal and bilateral Secured at: 19 cm Tube secured with: Tape Dental Injury: Teeth and Oropharynx as per pre-operative assessment

## 2022-01-01 ENCOUNTER — Other Ambulatory Visit: Payer: Self-pay | Admitting: Family Medicine

## 2022-01-05 ENCOUNTER — Encounter: Payer: Medicare Other | Attending: Physical Medicine & Rehabilitation | Admitting: Registered Nurse

## 2022-01-05 VITALS — BP 113/70 | HR 63 | Ht 64.0 in | Wt 198.2 lb

## 2022-01-05 DIAGNOSIS — M546 Pain in thoracic spine: Secondary | ICD-10-CM | POA: Insufficient documentation

## 2022-01-05 DIAGNOSIS — M1712 Unilateral primary osteoarthritis, left knee: Secondary | ICD-10-CM | POA: Insufficient documentation

## 2022-01-05 DIAGNOSIS — G8929 Other chronic pain: Secondary | ICD-10-CM | POA: Diagnosis not present

## 2022-01-05 MED ORDER — MORPHINE SULFATE ER 15 MG PO TBCR
15.0000 mg | EXTENDED_RELEASE_TABLET | Freq: Three times a day (TID) | ORAL | 0 refills | Status: DC
Start: 2022-01-05 — End: 2022-01-21

## 2022-01-05 NOTE — Progress Notes (Signed)
Subjective:    Patient ID: [name], female    DOB: 1951/03/17, 71 y.o.   MRN: [id number]  HPI: HARBOR PASTER is a 71 y.o. female who returns for follow up appointment for chronic pain and medication refill. She states her pain is located in her mid-back. She rates her pain 3. Her current exercise regime is walking short distances and performing stretching exercises.  Ms. Travaglini Morphine equivalent is 45.00 MME.   Last UDS was Performed on 10/06/2021, it was consistent.     Pain Inventory Average Pain 4 Pain Right Now 3 My pain is constant, burning, and aching  In the last 24 hours, has pain interfered with the following? General activity 4 Relation with others 4 Enjoyment of life 3 What TIME of day is your pain at its worst? evening and night Sleep (in general) Good  Pain is worse with: walking, bending, standing, and some activites Pain improves with: rest, heat/ice, and medication Relief from Meds: 5  Family History  Problem Relation Age of Onset   Breast cancer Mother    Diabetes Mother    Heart disease Mother    Allergies Mother    Diabetes Father    Heart disease Father    Emphysema Father    Allergies Father    Breast cancer Paternal Aunt    Colon cancer Neg Hx    Esophageal cancer Neg Hx    Rectal cancer Neg Hx    Stomach cancer Neg Hx    Social History   Socioeconomic History   Marital status: Married    Spouse name: Not on file   Number of children: Not on file   Years of education: Not on file   Highest education level: Not on file  Occupational History   Occupation: Radiation protection practitioner   Occupation: retired  Tobacco Use   Smoking status: Never   Smokeless tobacco: Never  Vaping Use   Vaping Use: Never used  Substance and Sexual Activity   Alcohol use: Yes    Alcohol/week: 1.0 standard drink of alcohol    Types: 1 Glasses of wine per week    Comment: rare   Drug use: Never   Sexual activity: Not Currently  Other Topics Concern   Not on file   Social History Narrative   Not on file   Social Determinants of Health   Financial Resource Strain: Low Risk  (05/31/2021)   Overall Financial Resource Strain (CARDIA)    Difficulty of Paying Living Expenses: Not hard at all  Food Insecurity: No Food Insecurity (05/31/2021)   Hunger Vital Sign    Worried About Running Out of Food in the Last Year: Never true    Ran Out of Food in the Last Year: Never true  Transportation Needs: No Transportation Needs (05/31/2021)   PRAPARE - Hydrologist (Medical): No    Lack of Transportation (Non-Medical): No  Physical Activity: Sufficiently Active (05/31/2021)   Exercise Vital Sign    Days of Exercise per Week: 7 days    Minutes of Exercise per Session: 30 min  Stress: No Stress Concern Present (05/31/2021)   [place]    Feeling of Stress : Not at all  Social Connections: [place] (05/31/2021)   Social Connection and Isolation Panel [NHANES]    Frequency of Communication with Friends and Family: Three times a week    Frequency of Social Gatherings with Friends and Family:  More than three times a week    Attends Religious Services: More than 4 times per year    Active Member of Clubs or Organizations: Yes    Attends Archivist Meetings: More than 4 times per year    Marital Status: Married   Past Surgical History:  Procedure Laterality Date   24 HOUR [place] STUDY N/A 01/04/2016   Procedure: 24 HOUR PH STUDY;  Surgeon: Manus Gunning, MD;  Location: [facility];  Service: Gastroenterology;  Laterality: N/A;   ABDOMINAL HYSTERECTOMY  1988   ADENOIDECTOMY     ANTERIOR CERVICAL DECOMP/DISCECTOMY FUSION  2009, 1993   first procedure in ~ 1993/Dr [place] N/A 12/16/2021   Procedure: ATRIAL FIBRILLATION ABLATION;  Surgeon: Vickie Epley, MD;  Location: [place] [facility];   Service: Cardiovascular;  Laterality: N/A;   BACK SURGERY     CARDIOVERSION N/A 04/03/2019   Procedure: CARDIOVERSION;  Surgeon: Dorothy Spark, MD;  Location: [facility] ENDOSCOPY;  Service: Cardiovascular;  Laterality: N/A;   CHOLECYSTECTOMY  2005   ESOPHAGEAL MANOMETRY N/A 01/04/2016   Procedure: ESOPHAGEAL MANOMETRY (EM) 24 HR PH;  Surgeon: Manus Gunning, MD;  Location: [facility];  Service: Gastroenterology;  Laterality: N/A;   ESOPHAGOGASTRODUODENOSCOPY (EGD) WITH PROPOFOL N/A 05/17/2016   Procedure: ESOPHAGOGASTRODUODENOSCOPY (EGD) WITH PROPOFOL;  Surgeon: Manus Gunning, MD;  Location: [facility];  Service: Gastroenterology;  Laterality: N/A;   NASAL SINUS SURGERY  2006   SAVORY DILATION N/A 05/17/2016   Procedure: SAVORY DILATION;  Surgeon: Manus Gunning, MD;  Location: [facility];  Service: Gastroenterology;  Laterality: N/A;   TEE WITHOUT CARDIOVERSION N/A 04/03/2019   Procedure: TRANSESOPHAGEAL ECHOCARDIOGRAM (TEE)-with DCCV;  Surgeon: Dorothy Spark, MD;  Location: [facility] ENDOSCOPY;; EF 55 to 60%.  No LVH.  Normal RV.  Moderate LA dilation.  No thrombus.  Mild RA dilation.  Mild to moderate TR.  Mild aortic valve sclerosis, no stenosis.  Moderately elevated PA pressures (47 mmHg)   TONSILLECTOMY AND ADENOIDECTOMY     TRANSTHORACIC ECHOCARDIOGRAM  2009   Normal LV size and function. EF 60-65%. No regional wall motion and amount is. Mild aortic sclerosis, no stenosis   TUBAL LIGATION  1979   Past Surgical History:  Procedure Laterality Date   6 HOUR [place] STUDY N/A 01/04/2016   Procedure: 24 HOUR PH STUDY;  Surgeon: Manus Gunning, MD;  Location: [facility];  Service: Gastroenterology;  Laterality: N/A;   ABDOMINAL HYSTERECTOMY  1988   ADENOIDECTOMY     ANTERIOR CERVICAL DECOMP/DISCECTOMY FUSION  2009, 1993   first procedure in ~ 1993/Dr [place] N/A 12/16/2021   Procedure: ATRIAL FIBRILLATION  ABLATION;  Surgeon: Vickie Epley, MD;  Location: [place] [facility];  Service: Cardiovascular;  Laterality: N/A;   BACK SURGERY     CARDIOVERSION N/A 04/03/2019   Procedure: CARDIOVERSION;  Surgeon: Dorothy Spark, MD;  Location: [facility] ENDOSCOPY;  Service: Cardiovascular;  Laterality: N/A;   CHOLECYSTECTOMY  2005   ESOPHAGEAL MANOMETRY N/A 01/04/2016   Procedure: ESOPHAGEAL MANOMETRY (EM) 24 HR PH;  Surgeon: Manus Gunning, MD;  Location: [facility];  Service: Gastroenterology;  Laterality: N/A;   ESOPHAGOGASTRODUODENOSCOPY (EGD) WITH PROPOFOL N/A 05/17/2016   Procedure: ESOPHAGOGASTRODUODENOSCOPY (EGD) WITH PROPOFOL;  Surgeon: Manus Gunning, MD;  Location: [facility];  Service: Gastroenterology;  Laterality: N/A;   NASAL SINUS SURGERY  2006  SAVORY DILATION N/A 05/17/2016   Procedure: SAVORY DILATION;  Surgeon: Manus Gunning, MD;  Location: [facility];  Service: Gastroenterology;  Laterality: N/A;   TEE WITHOUT CARDIOVERSION N/A 04/03/2019   Procedure: TRANSESOPHAGEAL ECHOCARDIOGRAM (TEE)-with DCCV;  Surgeon: Dorothy Spark, MD;  Location: [facility] ENDOSCOPY;; EF 55 to 60%.  No LVH.  Normal RV.  Moderate LA dilation.  No thrombus.  Mild RA dilation.  Mild to moderate TR.  Mild aortic valve sclerosis, no stenosis.  Moderately elevated PA pressures (47 mmHg)   TONSILLECTOMY AND ADENOIDECTOMY     TRANSTHORACIC ECHOCARDIOGRAM  2009   Normal LV size and function. EF 60-65%. No regional wall motion and amount is. Mild aortic sclerosis, no stenosis   TUBAL LIGATION  1979   Past Medical History:  Diagnosis Date   B12 deficiency    Complication of anesthesia    slow to wake up    Depression    Diabetes mellitus type 2, diet-controlled ([place]) 07/24/2018   Fibromyalgia    Generalized hyperhidrosis 04/17/2014   GERD (gastroesophageal reflux disease)    History of rheumatic fever as a child    Hyperlipemia    Hypertension    denies htn on 05/09/16    HYPOTHYROIDISM 05/29/2007   Irritable bowel syndrome    Migraines    hx of    OSA (obstructive sleep apnea) 06/26/2019   Senile calcific aortic valve sclerosis    Aortic Sclerosis-no stenosis   BP 113/70   Pulse 63   Ht '5\' 4"'$  (1.626 m)   Wt 198 lb 3.2 oz (89.9 kg)   SpO2 95%   BMI 34.02 kg/m   Opioid Risk Score:   Fall Risk Score:  `1  Depression screen [facility] 2/9     01/05/2022   11:34 AM 12/07/2021   11:39 AM 08/23/2021    2:14 PM 07/16/2021   10:25 AM 07/12/2021    1:20 PM 06/10/2021    2:45 PM 05/31/2021   11:20 AM  Depression screen PHQ 2/9  Decreased Interest 0 0 0 0 0 1 0  Down, Depressed, Hopeless 0 0 0 0 1 1 0  PHQ - 2 Score 0 0 0 0 1 2 0     Review of Systems  Constitutional: Negative.   HENT: Negative.    Eyes: Negative.   Respiratory: Negative.    Cardiovascular: Negative.   Gastrointestinal: Negative.   Endocrine: Negative.   Genitourinary: Negative.   Musculoskeletal:  Positive for back pain and neck pain.  Skin: Negative.   Allergic/Immunologic: Negative.   Neurological: Negative.   Hematological: Negative.   Psychiatric/Behavioral: Negative.        Objective:   Physical Exam Vitals and nursing note reviewed.  Constitutional:      Appearance: Normal appearance.  Cardiovascular:     Rate and Rhythm: Normal rate and regular rhythm.     Pulses: Normal pulses.     Heart sounds: Normal heart sounds.  Pulmonary:     Effort: Pulmonary effort is normal.     Breath sounds: Normal breath sounds.  Musculoskeletal:     Cervical back: Normal range of motion.     Comments: Normal Muscle Bulk and Muscle Testing Reveals:  Upper Extremities: Full ROM and Muscle Strength 5/5 Thoracic Paraspinal Tenderness: T-7-T-9 Lower Extremities: Full ROM and Muscle Strength 5/5 Arises from Table Slowly Narrow Based Gait     Skin:    General: Skin is warm and dry.  Neurological:     Mental Status: She  is alert and oriented to person, place, and time.  Psychiatric:         Mood and Affect: Mood normal.        Behavior: Behavior normal.         Assessment & Plan:  1. Cervical postlaminectomy syndrome:/ Cervicalgia/ Cervical Radiculitis/ with neck and shoulder pain/ Chronic Midline Thoracic Pain.  Continue exercise routine and using heat therapy. 01/05/2022 Continue : current medication regimen. Refilled: MS Contin 15 mg one tablet three times a day #90.   We will continue the opioid monitoring program, this consists of regular clinic visits, examinations, urine drug screen, pill counts as well as use of [place] Controlled Substance Reporting system. A 12 month History has been reviewed on the [place] Controlled Substance Reporting System on 01/05/2022.   2. Fibromyalgia/ Neuropathy: Continue current medication regimen with Gabapentin, activity and exercise regime. 01/05/2022. 3. Bilateral Knee Pain/ Mild Degenerative Changes L>R: S/P Left Knee Injection on 07/16/2021, with good relief noted. She will be scheduled for Left Knee Injection with Dr Letta Pate, she verbalizes understanding. Continue current medication regimen with Voltaren Gel. 01/05/2022. 4. Bilateral  Ankle Pain:  No complaints today. Continue HEP as tolerated. Continue to monitor.01/05/2022. 5. Muscle Spasm: Continue current medication regimen with  Robaxin. Continue to Monitor.01/05/2022.. 6. Opioid Induced Constipation: No complaints Continue to Monitor. 01/05/2022. 7. Midline Low Back Pain: Continue current medication regime, continue HEP as tolerated. 01/05/2022. 8. Right Gluteal Medius Weakness and Atrophy: No complaints Today:Continue with Hip Abduction Exercises as tolerated: Continue to Monitor. 01/05/2022. 9. Right Hip Pain/ Right Greater Trochanteric Tenderness: No complaints today. Continue to Alternate Ice and Heat Therapy. Continue to Monitor. 01/05/2022  10. Sacroiliac Joint Pain:  S/P Sacroiliac Injection with good relief noted. 01/05/2022 11. Lumbar Radiculitis: No  complaints today.Continue Gabapentin. Continue HEP as tolerated. Continue current medication regimen. Continue to monitor.  01/05/2022.     F/U in 1 month

## 2022-01-06 ENCOUNTER — Other Ambulatory Visit: Payer: Self-pay | Admitting: Registered Nurse

## 2022-01-10 ENCOUNTER — Other Ambulatory Visit: Payer: Self-pay | Admitting: Family Medicine

## 2022-01-10 DIAGNOSIS — Z1231 Encounter for screening mammogram for malignant neoplasm of breast: Secondary | ICD-10-CM

## 2022-01-13 ENCOUNTER — Ambulatory Visit (HOSPITAL_COMMUNITY)
Admission: RE | Admit: 2022-01-13 | Discharge: 2022-01-13 | Disposition: A | Discharge status: Home / Self Care | Payer: Medicare Other | Source: Ambulatory Visit | Attending: Nurse Practitioner | Admitting: Nurse Practitioner

## 2022-01-13 ENCOUNTER — Encounter: Payer: Self-pay | Admitting: Registered Nurse

## 2022-01-13 ENCOUNTER — Encounter (HOSPITAL_COMMUNITY): Payer: Self-pay | Admitting: Nurse Practitioner

## 2022-01-13 VITALS — BP 116/60 | HR 68 | Ht 64.0 in | Wt 199.2 lb

## 2022-01-13 DIAGNOSIS — Z7901 Long term (current) use of anticoagulants: Secondary | ICD-10-CM | POA: Insufficient documentation

## 2022-01-13 DIAGNOSIS — I10 Essential (primary) hypertension: Secondary | ICD-10-CM | POA: Diagnosis not present

## 2022-01-13 DIAGNOSIS — G4733 Obstructive sleep apnea (adult) (pediatric): Secondary | ICD-10-CM | POA: Insufficient documentation

## 2022-01-13 DIAGNOSIS — M797 Fibromyalgia: Secondary | ICD-10-CM | POA: Insufficient documentation

## 2022-01-13 DIAGNOSIS — E669 Obesity, unspecified: Secondary | ICD-10-CM | POA: Diagnosis not present

## 2022-01-13 DIAGNOSIS — D6869 Other thrombophilia: Secondary | ICD-10-CM | POA: Diagnosis not present

## 2022-01-13 DIAGNOSIS — I483 Typical atrial flutter: Secondary | ICD-10-CM | POA: Diagnosis not present

## 2022-01-13 DIAGNOSIS — E119 Type 2 diabetes mellitus without complications: Secondary | ICD-10-CM | POA: Insufficient documentation

## 2022-01-13 DIAGNOSIS — I480 Paroxysmal atrial fibrillation: Secondary | ICD-10-CM | POA: Diagnosis not present

## 2022-01-13 NOTE — Progress Notes (Signed)
Primary Care Physician: Owens Loffler, MD Primary Cardiologist: Dr Ellyn Hack Primary Electrophysiologist: Dr. Quentin Ore Referring Physician: Dr Bary Leriche is a 71 y.o. female with a history of fibromyalgia, DM, atrial flutter, HTN, OSA, paroxysmal atrial fibrillation who presents for follow up in the [facility]. The patient was initially diagnosed with atrial fibrillation 04/01/19 after presenting with one week of fatigue and weakness. She underwent TEE/DCCV on 04/03/19. Patient is on Eliquis for a CHADS2VASC score of 4. She reports that she has done well since then until the night of 08/28/19 when she noted heart racing symptoms with some lightheaded. When she got up the next morning and check her home ECG device she was in afib with RVR. There were no specific triggers that the patient could identify however, she did have a COVID vaccine two weeks prior. She is on BiPap for OSA. She denies any significant alcohol use. Patient presented for DCCV on 09/09/19 in SR and the procedure was cancelled. She reports that she feels much better in SR with more energy, less SOB, no heart racing or dizziness.   On follow up today, patient reports she has done very well since her last visit. She denies any heart racing or palpitations. She denies any bleeding issues on anticoagulation.   F/u in afib clinic, 01/13/22 for one month s/p ablation. She was referred for ablation by Adline Peals, PA, after last visit with him in April.  Since ablation the pt reports SR. Ekg today is in SR.   Today, she denies symptoms of palpitations, chest pain, shortness of breath, orthopnea, PND, lower extremity edema, presyncope, syncope, bleeding, or neurologic sequela. The patient is tolerating medications without difficulties and is otherwise without complaint today.    Atrial Fibrillation Risk Factors:  she does have symptoms or diagnosis of sleep apnea. she is compliant with BiPAP  therapy. she does not have a history of rheumatic fever. she does not have a history of alcohol use. The patient does not have a history of early familial atrial fibrillation or other arrhythmias.  she has a BMI of There is no height or weight on file to calculate BMI.. There were no vitals filed for this visit.   Family History  Problem Relation Age of Onset   Breast cancer Mother    Diabetes Mother    Heart disease Mother    Allergies Mother    Diabetes Father    Heart disease Father    Emphysema Father    Allergies Father    Breast cancer Paternal Aunt    Colon cancer Neg Hx    Esophageal cancer Neg Hx    Rectal cancer Neg Hx    Stomach cancer Neg Hx      Atrial Fibrillation Management history:  Previous antiarrhythmic drugs: flecainide Previous cardioversions: 04/03/19 Previous ablations: 12/16/21 by Dr. Cordelia Poche score: 4 Anticoagulation history: Eliquis   Past Medical History:  Diagnosis Date   B12 deficiency    Complication of anesthesia    slow to wake up    Depression    Diabetes mellitus type 2, diet-controlled ([place]) 07/24/2018   Fibromyalgia    Generalized hyperhidrosis 04/17/2014   GERD (gastroesophageal reflux disease)    History of rheumatic fever as a child    Hyperlipemia    Hypertension    denies htn on 05/09/16   HYPOTHYROIDISM 05/29/2007   Irritable bowel syndrome    Migraines    hx of  OSA (obstructive sleep apnea) 06/26/2019   Senile calcific aortic valve sclerosis    Aortic Sclerosis-no stenosis   Past Surgical History:  Procedure Laterality Date   70 HOUR [place] STUDY N/A 01/04/2016   Procedure: 24 HOUR PH STUDY;  Surgeon: Manus Gunning, MD;  Location: [facility];  Service: Gastroenterology;  Laterality: N/A;   ABDOMINAL HYSTERECTOMY  1988   ADENOIDECTOMY     ANTERIOR CERVICAL DECOMP/DISCECTOMY FUSION  2009, 1993   first procedure in ~ 1993/Dr [place] N/A  12/16/2021   Procedure: ATRIAL FIBRILLATION ABLATION;  Surgeon: Vickie Epley, MD;  Location: [place];  Service: Cardiovascular;  Laterality: N/A;   BACK SURGERY     CARDIOVERSION N/A 04/03/2019   Procedure: CARDIOVERSION;  Surgeon: Dorothy Spark, MD;  Location: [facility] ENDOSCOPY;  Service: Cardiovascular;  Laterality: N/A;   CHOLECYSTECTOMY  2005   ESOPHAGEAL MANOMETRY N/A 01/04/2016   Procedure: ESOPHAGEAL MANOMETRY (EM) 24 HR PH;  Surgeon: Manus Gunning, MD;  Location: [facility];  Service: Gastroenterology;  Laterality: N/A;   ESOPHAGOGASTRODUODENOSCOPY (EGD) WITH PROPOFOL N/A 05/17/2016   Procedure: ESOPHAGOGASTRODUODENOSCOPY (EGD) WITH PROPOFOL;  Surgeon: Manus Gunning, MD;  Location: [facility];  Service: Gastroenterology;  Laterality: N/A;   NASAL SINUS SURGERY  2006   SAVORY DILATION N/A 05/17/2016   Procedure: SAVORY DILATION;  Surgeon: Manus Gunning, MD;  Location: [facility];  Service: Gastroenterology;  Laterality: N/A;   TEE WITHOUT CARDIOVERSION N/A 04/03/2019   Procedure: TRANSESOPHAGEAL ECHOCARDIOGRAM (TEE)-with DCCV;  Surgeon: Dorothy Spark, MD;  Location: [facility] ENDOSCOPY;; EF 55 to 60%.  No LVH.  Normal RV.  Moderate LA dilation.  No thrombus.  Mild RA dilation.  Mild to moderate TR.  Mild aortic valve sclerosis, no stenosis.  Moderately elevated PA pressures (47 mmHg)   TONSILLECTOMY AND ADENOIDECTOMY     TRANSTHORACIC ECHOCARDIOGRAM  2009   Normal LV size and function. EF 60-65%. No regional wall motion and amount is. Mild aortic sclerosis, no stenosis   TUBAL LIGATION  1979    Current Outpatient Medications  Medication Sig Dispense Refill   acetaminophen (TYLENOL) 650 MG CR tablet Take 650 mg by mouth in the morning and at bedtime.     B Complex Vitamins (B COMPLEX 100 PO) Take 1 tablet by mouth every morning.      Barberry-Oreg Grape-Goldenseal (BERBERINE COMPLEX PO) Take 1 tablet by mouth in the morning.     Calcium  Carbonate-Vitamin D (CALCIUM 600+D PO) Take 1 tablet by mouth in the morning and at bedtime.      cetirizine (ZYRTEC) 10 MG tablet Take 10 mg by mouth at bedtime.      Cholecalciferol (VITAMIN D) 125 MCG (5000 UT) CAPS Take 5,000 Units by mouth every morning.      clindamycin (CLEOCIN T) 1 % lotion Apply 1 application topically daily as needed (eczema).      Coenzyme Q-10 100 MG capsule Take 100 mg by mouth every morning.      colchicine 0.6 MG tablet Take 1 tablet (0.6 mg total) by mouth 2 (two) times daily for 5 days. 10 tablet 0   diclofenac sodium (VOLTAREN) 1 % GEL Apply 4 g topically 4 (four) times daily as needed (joint pain).      diltiazem (CARDIZEM CD) 240 MG 24 hr capsule TAKE 1 CAPSULE BY MOUTH EVERY DAY 90 capsule 3   diltiazem (CARDIZEM) 30 MG tablet Take one tablet  by mouth every 4 hours as needed for heart rate greater than 100, top number blood pressure needs to be above 100 45 tablet 1   docusate sodium (COLACE) 250 MG capsule Take 250 mg by mouth at bedtime.     ELIQUIS 5 MG TABS tablet TAKE 1 TABLET BY MOUTH  TWICE DAILY 180 tablet 3   Flaxseed, Linseed, (FLAXSEED OIL) 1200 MG CAPS Take 1,200 mg by mouth every other day.     flecainide (TAMBOCOR) 100 MG tablet Take 1 tablet (100 mg total) by mouth 2 (two) times daily. 60 tablet 3   fluocinonide (LIDEX) 0.05 % external solution Apply 1 application topically daily as needed (eczema on scalp).      gabapentin (NEURONTIN) 300 MG capsule TAKE 1 CAPSULE BY MOUTH 3  TIMES DAILY 270 capsule 3   hydroxypropyl methylcellulose / hypromellose (ISOPTO TEARS / GONIOVISC) 2.5 % ophthalmic solution Place 1-2 drops into both eyes 3 (three) times daily as needed (dry/irritated eyes.).     levothyroxine (SYNTHROID) 50 MCG tablet TAKE 1 TABLET BY MOUTH  DAILY 90 tablet 3   loratadine (CLARITIN) 10 MG tablet Take 10 mg by mouth every morning.      Magnesium Bisglycinate (MAG GLYCINATE PO) Take 400 mg by mouth at bedtime.     methocarbamol (ROBAXIN)  500 MG tablet TAKE 1 TABLET BY MOUTH EVERY 8 HOURS AS NEEDED FOR MUSCLE SPASM 270 tablet 3   mirtazapine (REMERON) 15 MG tablet Take 1/2 tab by mouth at bedtime     morphine (MS CONTIN) 15 MG 12 hr tablet Take 1 tablet (15 mg total) by mouth 3 (three) times daily. 90 tablet 0   oxybutynin (DITROPAN-XL) 10 MG 24 hr tablet TAKE 1 TABLET BY MOUTH AT  BEDTIME 90 tablet 3   oxymetazoline (AFRIN) 0.05 % nasal spray Place 1 spray into both nostrils 2 (two) times daily as needed for congestion.     pantoprazole (PROTONIX) 40 MG tablet Take 1 tablet (40 mg total) by mouth daily. 45 tablet 0   RABEprazole (ACIPHEX) 20 MG tablet Take 1 tablet (20 mg total) by mouth every evening. 90 tablet 3   Resveratrol 100 MG CAPS Take 100 mg by mouth in the morning and at bedtime.     tretinoin (RETIN-A) 0.05 % cream Apply 1 application topically daily as needed (acne).      vitamin C (ASCORBIC ACID) 500 MG tablet Take 500 mg by mouth in the morning and at bedtime.     Wheat Dextrin (BENEFIBER) CHEW Chew 2 tablets by mouth 2 (two) times daily as needed (regularity).     No current facility-administered medications for this encounter.    Allergies  Allergen Reactions   Cyclobenzaprine Hcl Anaphylaxis and Swelling    Reaction - throat closed, tongue swelling   Rizatriptan Benzoate Anaphylaxis    Throat and Tongue Swelling Closed   Sumatriptan Swelling    Throat closed & Tongue Swelling   Tizanidine Shortness Of Breath   Aleve [Naproxen Sodium] Hives   Aspartame Other (See Comments)   Clarithromycin Hives   Corn-Containing Products Other (See Comments)    Head congestion   Doxycycline Diarrhea and Nausea And Vomiting   Duloxetine Nausea Only   Egg [Eggs Or Egg-Derived Products] Diarrhea and Nausea And Vomiting   Keflex [Cephalexin] Itching and Other (See Comments)    Sores   Metronidazole Diarrhea and Nausea Only   Monosodium Glutamate Diarrhea and Other (See Comments)   Oxycodone Hcl Diarrhea and Nausea  And Vomiting   Oxycontin [Oxycodone Hcl] Nausea And Vomiting   Penicillins Other (See Comments)    Unknown reaction Did it involve swelling of the face/tongue/throat, SOB, or low BP? Unknown Did it involve sudden or severe rash/hives, skin peeling, or any reaction on the inside of your mouth or nose? Unknown Did you need to seek medical attention at a hospital or doctor's office? Unknown When did it last happen?   infant   If all above answers are "NO", may proceed with cephalosporin use.   Rosuvastatin Other (See Comments)    Muscle aches   Venlafaxine Hives   Voltaren [Diclofenac Sodium] Hives    Reaction to oral med, uses gel occasionally   Septra [Sulfamethoxazole-Trimethoprim] Itching and Rash   Tramadol Diarrhea, Nausea And Vomiting and Palpitations    Social History   Socioeconomic History   Marital status: Married    Spouse name: Not on file   Number of children: Not on file   Years of education: Not on file   Highest education level: Not on file  Occupational History   Occupation: Radiation protection practitioner   Occupation: retired  Tobacco Use   Smoking status: Never   Smokeless tobacco: Never  Vaping Use   Vaping Use: Never used  Substance and Sexual Activity   Alcohol use: Yes    Alcohol/week: 1.0 standard drink of alcohol    Types: 1 Glasses of wine per week    Comment: rare   Drug use: Never   Sexual activity: Not Currently  Other Topics Concern   Not on file  Social History Narrative   Not on file   Social Determinants of Health   Financial Resource Strain: Low Risk  (05/31/2021)   Overall Financial Resource Strain (CARDIA)    Difficulty of Paying Living Expenses: Not hard at all  Food Insecurity: No Food Insecurity (05/31/2021)   Hunger Vital Sign    Worried About Running Out of Food in the Last Year: Never true    Ran Out of Food in the Last Year: Never true  Transportation Needs: No Transportation Needs (05/31/2021)   PRAPARE - Radiographer, therapeutic (Medical): No    Lack of Transportation (Non-Medical): No  Physical Activity: Sufficiently Active (05/31/2021)   Exercise Vital Sign    Days of Exercise per Week: 7 days    Minutes of Exercise per Session: 30 min  Stress: No Stress Concern Present (05/31/2021)   [place]    Feeling of Stress : Not at all  Social Connections: [place] (05/31/2021)   Social Connection and Isolation Panel [NHANES]    Frequency of Communication with Friends and Family: Three times a week    Frequency of Social Gatherings with Friends and Family: More than three times a week    Attends Religious Services: More than 4 times per year    Active Member of [employer] or Organizations: Yes    Attends Music therapist: More than 4 times per year    Marital Status: Married  Human resources officer Violence: Not At Risk (05/31/2021)   Humiliation, Afraid, Rape, and Kick questionnaire    Fear of Current or Ex-Partner: No    Emotionally Abused: No    Physically Abused: No    Sexually Abused: No     ROS- All systems are reviewed and negative except as per the HPI above.  Physical Exam: There were no vitals filed for this visit.  GEN- The patient is well appearing obese female, alert and oriented x 3 today.   HEENT-head normocephalic, atraumatic, sclera clear, conjunctiva pink, hearing intact, trachea midline. Lungs- Clear to ausculation bilaterally, normal work of breathing Heart- Regular rate and rhythm, no murmurs, rubs or gallops  GI- soft, NT, ND, + BS Extremities- no clubbing, cyanosis, or edema MS- no significant deformity or atrophy Skin- no rash or lesion Psych- euthymic mood, full affect Neuro- strength and sensation are intact   Wt Readings from Last 3 Encounters:  01/05/22 89.9 kg  12/16/21 90.7 kg  12/07/21 91.6 kg    EKG today demonstrates  Vent. rate 68 BPM PR interval 196 ms QRS duration 104  ms QT/QTcB 398/423 ms P-R-T axes 62 51 61 Normal sinus rhythm Normal ECG When compared with ECG of 16-Dec-2021 13:11, PREVIOUS ECG IS PRESENT  TEE 04/03/19 demonstrated   1. Left ventricular ejection fraction, by visual estimation, is 55 to  60%. The left ventricle has normal function. Normal left ventricular size.  There is no left ventricular hypertrophy.   2. Global right ventricle has normal systolic function.The right  ventricular size is normal. No increase in right ventricular wall  thickness.   3. Left atrial size was moderately dilated.   4. No thrombus is seen in the left atrium or left atrial appendage.   5. Right atrial size was mildly dilated.   6. The mitral valve is normal in structure. Mild mitral valve  regurgitation. No evidence of mitral stenosis.   7. The tricuspid valve is normal in structure. Tricuspid valve  regurgitation mild-moderate.   8. The aortic valve is normal in structure. Aortic valve regurgitation is  mild by color flow Doppler. Mild aortic valve sclerosis without stenosis.   9. The pulmonic valve was normal in structure. Pulmonic valve  regurgitation is not visualized by color flow Doppler.  10. Moderately elevated pulmonary artery systolic pressure.  11. The tricuspid regurgitant velocity is 3.12 m/s, and with an assumed  right atrial pressure of 8 mmHg, the estimated right ventricular systolic  pressure is moderately elevated at 46.9 mmHg.  12. The inferior vena cava is normal in size with greater than 50%  respiratory variability, suggesting right atrial pressure of 3 mmHg.  13. No thrombus is seen in the left atrium or left atrial appendage.  14. This TEE was followed by a successful cardioversion.   Epic records are reviewed at length today  CHA2DS2-VASc Score = 4 The patient's score is based upon: CHF History: No HTN History: Yes Age : 18-74 Diabetes History: Yes Stroke History: No Vascular Disease History: No Gender:  Female   ASSESSMENT AND PLAN: 1. Paroxysmal Atrial Fibrillation/typical atrial flutter The patient's CHA2DS2-VASc score is 4, indicating a 4.8% annual risk of stroke.   Patient appears to be maintaining SR s/p afib ablation 12/16/21 Continue flecainide 100 BID Continue diltiazem 240 mg daily  Continue Eliquis 5 mg BID  2. Secondary Hypercoagulable State (ICD10:  D68.69) The patient is at significant risk for stroke/thromboembolism based upon her CHA2DS2-VASc Score of 4.  Continue Apixaban (Eliquis) without any interruption for elective procedures for ht full 3 month recovery period   3. Obesity There is no height or weight on file to calculate BMI. Lifestyle modification was discussed and encouraged including regular physical activity and weight reduction.  4. OSA Patient reports compliance with BiPap therapy.  5. HTN Stable, no changes today.   Follow up with Dr  Quentin Ore as scheduled 03/02/22   Butch Penny  Eulah Pont, [facility] [address] [place], [place] [address] [phone]

## 2022-01-21 ENCOUNTER — Encounter: Payer: Medicare Other | Admitting: Physical Medicine & Rehabilitation

## 2022-01-21 ENCOUNTER — Encounter: Payer: Self-pay | Admitting: Physical Medicine & Rehabilitation

## 2022-01-21 VITALS — BP 112/66 | HR 61 | Ht 64.0 in | Wt 197.8 lb

## 2022-01-21 DIAGNOSIS — G8929 Other chronic pain: Secondary | ICD-10-CM | POA: Diagnosis not present

## 2022-01-21 DIAGNOSIS — M1712 Unilateral primary osteoarthritis, left knee: Secondary | ICD-10-CM

## 2022-01-21 DIAGNOSIS — M546 Pain in thoracic spine: Secondary | ICD-10-CM | POA: Diagnosis not present

## 2022-01-21 MED ORDER — MORPHINE SULFATE ER 15 MG PO TBCR: 15.0000 mg | EXTENDED_RELEASE_TABLET | Freq: Three times a day (TID) | ORAL | 0 refills | Status: DC

## 2022-01-21 NOTE — Progress Notes (Signed)
Knee injection Left without [place]  Indication:LEFT Knee pain not relieved by medication management and other conservative care.  Informed consent was obtained after describing risks and benefits of the procedure with the patient, this includes bleeding, bruising, infection and medication side effects. The patient wishes to proceed and has given written consent. The patient was placed in a recumbent position. The medial aspect of the knee was marked and prepped with Betadine and alcohol. It was then entered with a 25-gauge 1-1/2  was inserted into the knee joint. After negative draw back for blood, a solution containing one ML of '6mg'$  per mL betamethasone and 3 mL of 1% lidocaine were injected. The patient tolerated the procedure well. Post procedure instructions were given.  Performed by Dr Jodi Mourning under the direct supervision of  Charlett Blake M.D. [place] [facility] [employer] Fellow [employer] Board of Interventional Pain  If only short term result consider Synvisc vs Zilretta

## 2022-01-21 NOTE — Patient Instructions (Signed)

## 2022-01-25 ENCOUNTER — Encounter: Payer: Medicare Other | Admitting: Physical Medicine & Rehabilitation

## 2022-01-26 ENCOUNTER — Other Ambulatory Visit (HOSPITAL_COMMUNITY): Payer: Self-pay | Admitting: Physician Assistant

## 2022-01-27 ENCOUNTER — Ambulatory Visit
Admission: RE | Admit: 2022-01-27 | Discharge: 2022-01-27 | Disposition: A | Discharge status: Home / Self Care | Payer: Medicare Other | Source: Ambulatory Visit | Attending: Family Medicine | Admitting: Family Medicine

## 2022-01-27 DIAGNOSIS — Z1231 Encounter for screening mammogram for malignant neoplasm of breast: Secondary | ICD-10-CM

## 2022-02-17 ENCOUNTER — Ambulatory Visit (HOSPITAL_COMMUNITY): Payer: Medicare Other | Admitting: Nurse Practitioner

## 2022-03-02 ENCOUNTER — Encounter: Payer: Medicare Other | Attending: Physical Medicine & Rehabilitation | Admitting: Registered Nurse

## 2022-03-02 ENCOUNTER — Encounter: Payer: Self-pay | Admitting: Registered Nurse

## 2022-03-02 VITALS — BP 121/69 | HR 71 | Ht 64.0 in | Wt 190.0 lb

## 2022-03-02 DIAGNOSIS — M542 Cervicalgia: Secondary | ICD-10-CM | POA: Diagnosis not present

## 2022-03-02 DIAGNOSIS — G894 Chronic pain syndrome: Secondary | ICD-10-CM | POA: Diagnosis not present

## 2022-03-02 DIAGNOSIS — G8929 Other chronic pain: Secondary | ICD-10-CM | POA: Insufficient documentation

## 2022-03-02 DIAGNOSIS — M546 Pain in thoracic spine: Secondary | ICD-10-CM | POA: Insufficient documentation

## 2022-03-02 DIAGNOSIS — Z79891 Long term (current) use of opiate analgesic: Secondary | ICD-10-CM | POA: Diagnosis not present

## 2022-03-02 DIAGNOSIS — Z5181 Encounter for therapeutic drug level monitoring: Secondary | ICD-10-CM | POA: Insufficient documentation

## 2022-03-02 DIAGNOSIS — M1712 Unilateral primary osteoarthritis, left knee: Secondary | ICD-10-CM | POA: Insufficient documentation

## 2022-03-02 MED ORDER — MORPHINE SULFATE ER 15 MG PO TBCR: 15.0000 mg | EXTENDED_RELEASE_TABLET | Freq: Three times a day (TID) | ORAL | 0 refills | Status: DC

## 2022-03-02 NOTE — Progress Notes (Signed)
Subjective:    Patient ID: [name], female    DOB: 07-Aug-1950, 71 y.o.   MRN: [id number]  HPI: DAVISHA LINTHICUM is a 71 y.o. female who returns for follow up appointment for chronic pain and medication refill. She states her pain is located in her neck, mid- back and left knee pain. She rates her pain 4. Her current exercise regime is walking and performing stretching exercises.  Ms. Lienhard Morphine equivalent is 45.00 MME.  UDS ordered today.    Pain Inventory Average Pain 4 Pain Right Now 4 My pain is constant, burning, and aching  In the last 24 hours, has pain interfered with the following? General activity 5 Relation with others 4 Enjoyment of life 4 What TIME of day is your pain at its worst? evening and night Sleep (in general) Good  Pain is worse with: walking, bending, standing, and some activites Pain improves with: rest and heat/ice Relief from Meds: 5  Family History  Problem Relation Age of Onset   Breast cancer Mother    Diabetes Mother    Heart disease Mother    Allergies Mother    Diabetes Father    Heart disease Father    Emphysema Father    Allergies Father    Breast cancer Paternal Aunt    Colon cancer Neg Hx    Esophageal cancer Neg Hx    Rectal cancer Neg Hx    Stomach cancer Neg Hx    Social History   Socioeconomic History   Marital status: Married    Spouse name: Not on file   Number of children: Not on file   Years of education: Not on file   Highest education level: Not on file  Occupational History   Occupation: Radiation protection practitioner   Occupation: retired  Tobacco Use   Smoking status: Never   Smokeless tobacco: Never   Tobacco comments:    Never smoke 01/13/22  Vaping Use   Vaping Use: Never used  Substance and Sexual Activity   Alcohol use: Yes    Alcohol/week: 1.0 standard drink of alcohol    Types: 1 Glasses of wine per week    Comment: rare   Drug use: Never   Sexual activity: Not Currently  Other Topics Concern   Not on file   Social History Narrative   Not on file   Social Determinants of Health   Financial Resource Strain: Low Risk  (05/31/2021)   Overall Financial Resource Strain (CARDIA)    Difficulty of Paying Living Expenses: Not hard at all  Food Insecurity: No Food Insecurity (05/31/2021)   Hunger Vital Sign    Worried About Running Out of Food in the Last Year: Never true    Ran Out of Food in the Last Year: Never true  Transportation Needs: No Transportation Needs (05/31/2021)   PRAPARE - Hydrologist (Medical): No    Lack of Transportation (Non-Medical): No  Physical Activity: Sufficiently Active (05/31/2021)   Exercise Vital Sign    Days of Exercise per Week: 7 days    Minutes of Exercise per Session: 30 min  Stress: No Stress Concern Present (05/31/2021)   [place]    Feeling of Stress : Not at all  Social Connections: [place] (05/31/2021)   Social Connection and Isolation Panel [NHANES]    Frequency of Communication with Friends and Family: Three times a week    Frequency of Social  Gatherings with Friends and Family: More than three times a week    Attends Religious Services: More than 4 times per year    Active Member of Clubs or Organizations: Yes    Attends Archivist Meetings: More than 4 times per year    Marital Status: Married   Past Surgical History:  Procedure Laterality Date   24 HOUR [place] STUDY N/A 01/04/2016   Procedure: 24 HOUR [place];  Surgeon: Manus Gunning, MD;  Location: [facility];  Service: Gastroenterology;  Laterality: N/A;   ABDOMINAL HYSTERECTOMY  1988   ADENOIDECTOMY     ANTERIOR CERVICAL DECOMP/DISCECTOMY FUSION  2009, 1993   first procedure in ~ 1993/Dr [place] N/A 12/16/2021   Procedure: ATRIAL FIBRILLATION ABLATION;  Surgeon: Vickie Epley, MD;  Location: [place] [facility];   Service: Cardiovascular;  Laterality: N/A;   BACK SURGERY     CARDIOVERSION N/A 04/03/2019   Procedure: CARDIOVERSION;  Surgeon: Dorothy Spark, MD;  Location: [facility] ENDOSCOPY;  Service: Cardiovascular;  Laterality: N/A;   CHOLECYSTECTOMY  2005   ESOPHAGEAL MANOMETRY N/A 01/04/2016   Procedure: ESOPHAGEAL MANOMETRY (EM) 24 HR PH;  Surgeon: Manus Gunning, MD;  Location: [facility];  Service: Gastroenterology;  Laterality: N/A;   ESOPHAGOGASTRODUODENOSCOPY (EGD) WITH PROPOFOL N/A 05/17/2016   Procedure: ESOPHAGOGASTRODUODENOSCOPY (EGD) WITH PROPOFOL;  Surgeon: Manus Gunning, MD;  Location: [facility];  Service: Gastroenterology;  Laterality: N/A;   NASAL SINUS SURGERY  2006   SAVORY DILATION N/A 05/17/2016   Procedure: SAVORY DILATION;  Surgeon: Manus Gunning, MD;  Location: [facility];  Service: Gastroenterology;  Laterality: N/A;   TEE WITHOUT CARDIOVERSION N/A 04/03/2019   Procedure: TRANSESOPHAGEAL ECHOCARDIOGRAM (TEE)-with DCCV;  Surgeon: Dorothy Spark, MD;  Location: [facility] ENDOSCOPY;; EF 55 to 60%.  No LVH.  Normal RV.  Moderate LA dilation.  No thrombus.  Mild RA dilation.  Mild to moderate TR.  Mild aortic valve sclerosis, no stenosis.  Moderately elevated PA pressures (47 mmHg)   TONSILLECTOMY AND ADENOIDECTOMY     TRANSTHORACIC ECHOCARDIOGRAM  2009   Normal LV size and function. EF 60-65%. No regional wall motion and amount is. Mild aortic sclerosis, no stenosis   TUBAL LIGATION  1979   Past Surgical History:  Procedure Laterality Date   46 HOUR [place] STUDY N/A 01/04/2016   Procedure: 24 HOUR [place] STUDY;  Surgeon: Manus Gunning, MD;  [facility]: [facility];  Service: Gastroenterology;  Laterality: N/A;   ABDOMINAL HYSTERECTOMY  1988   ADENOIDECTOMY     ANTERIOR CERVICAL DECOMP/DISCECTOMY FUSION  2009, 1993   first procedure in ~ 1993/Dr [place] N/A 12/16/2021   Procedure: ATRIAL FIBRILLATION  ABLATION;  Surgeon: Vickie Epley, MD;  Location: [place] CV LAB;  Service: Cardiovascular;  Laterality: N/A;   BACK SURGERY     CARDIOVERSION N/A 04/03/2019   Procedure: CARDIOVERSION;  Surgeon: Dorothy Spark, MD;  Location: [facility] ENDOSCOPY;  Service: Cardiovascular;  Laterality: N/A;   CHOLECYSTECTOMY  2005   ESOPHAGEAL MANOMETRY N/A 01/04/2016   Procedure: ESOPHAGEAL MANOMETRY (EM) 24 HR PH;  Surgeon: Manus Gunning, MD;  [facility];  Service: Gastroenterology;  Laterality: N/A;   ESOPHAGOGASTRODUODENOSCOPY (EGD) WITH PROPOFOL N/A 05/17/2016   Procedure: ESOPHAGOGASTRODUODENOSCOPY (EGD) WITH PROPOFOL;  Surgeon: Manus Gunning, MD;  [facility]: [facility];  Service: Gastroenterology;  Laterality: N/A;   NASAL  SINUS SURGERY  2006   SAVORY DILATION N/A 05/17/2016   Procedure: SAVORY DILATION;  Surgeon: Manus Gunning, MD;  Location: [facility];  Service: Gastroenterology;  Laterality: N/A;   TEE WITHOUT CARDIOVERSION N/A 04/03/2019   Procedure: TRANSESOPHAGEAL ECHOCARDIOGRAM (TEE)-with DCCV;  Surgeon: Dorothy Spark, MD;  Location: [facility] ENDOSCOPY;; EF 55 to 60%.  No LVH.  Normal RV.  Moderate LA dilation.  No thrombus.  Mild RA dilation.  Mild to moderate TR.  Mild aortic valve sclerosis, no stenosis.  Moderately elevated PA pressures (47 mmHg)   TONSILLECTOMY AND ADENOIDECTOMY     TRANSTHORACIC ECHOCARDIOGRAM  2009   Normal LV size and function. EF 60-65%. No regional wall motion and amount is. Mild aortic sclerosis, no stenosis   TUBAL LIGATION  1979   Past Medical History:  Diagnosis Date   B12 deficiency    Complication of anesthesia    slow to wake up    Depression    Diabetes mellitus type 2, diet-controlled ([place]) 07/24/2018   Fibromyalgia    Generalized hyperhidrosis 04/17/2014   GERD (gastroesophageal reflux disease)    History of rheumatic fever as a child    Hyperlipemia    Hypertension    denies htn on 05/09/16    HYPOTHYROIDISM 05/29/2007   Irritable bowel syndrome    Migraines    hx of    OSA (obstructive sleep apnea) 06/26/2019   Senile calcific aortic valve sclerosis    Aortic Sclerosis-no stenosis   There were no vitals taken for this visit.  Opioid Risk Score:   Fall Risk Score:  `1  Depression screen PHQ 2/9     01/21/2022    1:17 PM 01/05/2022   11:34 AM 12/07/2021   11:39 AM 08/23/2021    2:14 PM 07/16/2021   10:25 AM 07/12/2021    1:20 PM 06/10/2021    2:45 PM  Depression screen PHQ 2/9  Decreased Interest 0 0 0 0 0 0 1  Down, Depressed, Hopeless 0 0 0 0 0 1 1  PHQ - 2 Score 0 0 0 0 0 1 2    Review of Systems  Musculoskeletal:  Positive for back pain and neck pain.       Pain in shoulder, & left knee  All other systems reviewed and are negative.     Objective:   Physical Exam Vitals and nursing note reviewed.  Constitutional:      Appearance: Normal appearance.  Cardiovascular:     Rate and Rhythm: Normal rate and regular rhythm.     Pulses: Normal pulses.     Heart sounds: Normal heart sounds.  Pulmonary:     Effort: Pulmonary effort is normal.     Breath sounds: Normal breath sounds.  Musculoskeletal:     Cervical back: Normal range of motion and neck supple.     Comments: Normal Muscle Bulk and Muscle Testing Reveals:  Upper Extremities: Full ROM and Muscle Strength  5/5 Thoracic Paraspinal Tenderness: T-7-T-9 Lower Extremities: Full ROM and Muscle Strength 5/5 Arises from Table with ease Narrow Based Gait     Skin:    General: Skin is warm and dry.  Neurological:     Mental Status: She is alert and oriented to person, place, and time.  Psychiatric:        Mood and Affect: Mood normal.        Behavior: Behavior normal.         Assessment & Plan:  1. Cervical postlaminectomy syndrome:/  Cervicalgia/ Cervical Radiculitis/ with neck and shoulder pain/ Chronic Midline Thoracic Pain.  Continue exercise routine and using heat therapy. 03/02/2022 Continue :  current medication regimen. Refilled: MS Contin 15 mg one tablet three times a day #90.   We will continue the opioid monitoring program, this consists of regular clinic visits, examinations, urine drug screen, pill counts as well as use of [place] Controlled Substance Reporting system. A 12 month History has been reviewed on the [place] on 03/02/2022.   2. Fibromyalgia/ Neuropathy: Continue current medication regimen with Gabapentin, activity and exercise regime. 03/02/2022. 3. Bilateral Knee Pain/ Mild Degenerative Changes L>R: S/P Left Knee Injection on 07/16/2021, with good relief noted.  Continue current medication regimen with Voltaren Gel. 03/02/2022. 4. Bilateral  Ankle Pain:  No complaints today. Continue HEP as tolerated. Continue to monitor.03/02/2022. 5. Muscle Spasm: Continue current medication regimen with  Robaxin. Continue to Monitor.03/02/2022.. 6. Opioid Induced Constipation: No complaints Continue to Monitor. 03/02/2022. 7. Midline Low Back Pain: Continue current medication regime, continue HEP as tolerated. 03/02/2022. 8. Right Gluteal Medius Weakness and Atrophy: No complaints Today:Continue with Hip Abduction Exercises as tolerated: Continue to Monitor. 03/02/2022. 9. Right Hip Pain/ Right Greater Trochanteric Tenderness: No complaints today. Continue to Alternate Ice and Heat Therapy. Continue to Monitor. 03/02/2022  10. Sacroiliac Joint Pain:  S/P Sacroiliac Injection with good relief noted. 03/02/2022 11. Lumbar Radiculitis: No complaints today.Continue Gabapentin. Continue HEP as tolerated. Continue current medication regimen. Continue to monitor.  03/02/2022.     F/U in 1 month

## 2022-03-05 LAB — TOXASSURE SELECT,+ANTIDEPR,UR

## 2022-03-11 ENCOUNTER — Other Ambulatory Visit: Payer: Self-pay | Admitting: Physical Medicine & Rehabilitation

## 2022-03-11 ENCOUNTER — Telehealth: Payer: Self-pay | Admitting: *Deleted

## 2022-03-11 NOTE — Telephone Encounter (Signed)
Urine drug screen for this encounter is consistent for prescribed medication 

## 2022-03-14 NOTE — Progress Notes (Unsigned)
Electrophysiology Office Follow up Visit Note:    Date:  03/16/2022   ID:  [name], DOB 1951-03-15, MRN [id number]  PCP:  Owens Loffler, MD  [facility] Cardiologist:  Glenetta Hew, MD  [facility] Electrophysiologist:  None    Interval History:    [name] is a 71 y.o. female who presents for a follow up visit.  She had an AF ablation 12/16/2021. During the procedure, the veins and posterior wall were isolated.  She saw Butch Penny in follow up 01/13/2022. At that appointment she was doing well without recurrent AF. She takes eliquis for stroke ppx. She has previously been prescribed flecianide in addition to her diltiazem.          Past Medical History:  Diagnosis Date   B12 deficiency    Complication of anesthesia    slow to wake up    Depression    Diabetes mellitus type 2, diet-controlled ([place]) 07/24/2018   Fibromyalgia    Generalized hyperhidrosis 04/17/2014   GERD (gastroesophageal reflux disease)    History of rheumatic fever as a child    Hyperlipemia    Hypertension    denies htn on 05/09/16   HYPOTHYROIDISM 05/29/2007   Irritable bowel syndrome    Migraines    hx of    OSA (obstructive sleep apnea) 06/26/2019   Senile calcific aortic valve sclerosis    Aortic Sclerosis-no stenosis    Past Surgical History:  Procedure Laterality Date   71 HOUR [place] STUDY N/A 01/04/2016   Procedure: 24 HOUR [place] STUDY;  Surgeon: Manus Gunning, MD;  Location: [facility];  Service: Gastroenterology;  Laterality: N/A;   ABDOMINAL HYSTERECTOMY  1988   ADENOIDECTOMY     ANTERIOR CERVICAL DECOMP/DISCECTOMY FUSION  2009, 1993   first procedure in ~ 1993/Dr Viola N/A 12/16/2021   Procedure: ATRIAL FIBRILLATION ABLATION;  Surgeon: Vickie Epley, MD;  Location: [place] CV LAB;  Service: Cardiovascular;  Laterality: N/A;   BACK SURGERY     CARDIOVERSION N/A 04/03/2019   Procedure: CARDIOVERSION;   Surgeon: Dorothy Spark, MD;  Location: [facility] ENDOSCOPY;  Service: Cardiovascular;  Laterality: N/A;   CHOLECYSTECTOMY  2005   ESOPHAGEAL MANOMETRY N/A 01/04/2016   Procedure: ESOPHAGEAL MANOMETRY (EM) 24 HR PH;  Surgeon: Manus Gunning, MD;  [facility]: [facility];  Service: Gastroenterology;  Laterality: N/A;   ESOPHAGOGASTRODUODENOSCOPY (EGD) WITH PROPOFOL N/A 05/17/2016   Procedure: ESOPHAGOGASTRODUODENOSCOPY (EGD) WITH PROPOFOL;  Surgeon: Manus Gunning, MD;  Location: [facility];  Service: Gastroenterology;  Laterality: N/A;   NASAL SINUS SURGERY  2006   SAVORY DILATION N/A 05/17/2016   Procedure: SAVORY DILATION;  Surgeon: Manus Gunning, MD;  Location: [facility];  Service: Gastroenterology;  Laterality: N/A;   TEE WITHOUT CARDIOVERSION N/A 04/03/2019   Procedure: TRANSESOPHAGEAL ECHOCARDIOGRAM (TEE)-with DCCV;  Surgeon: Dorothy Spark, MD;  Location: [facility] ENDOSCOPY;; EF 55 to 60%.  No LVH.  Normal RV.  Moderate LA dilation.  No thrombus.  Mild RA dilation.  Mild to moderate TR.  Mild aortic valve sclerosis, no stenosis.  Moderately elevated PA pressures (47 mmHg)   TONSILLECTOMY AND ADENOIDECTOMY     TRANSTHORACIC ECHOCARDIOGRAM  2009   Normal LV size and function. EF 60-65%. No regional wall motion and amount is. Mild aortic sclerosis, no stenosis   TUBAL LIGATION  1979    Current Medications: Current Meds  Medication Sig   acetaminophen (TYLENOL) 650  MG CR tablet Take 650 mg by mouth in the morning and at bedtime.   cetirizine (ZYRTEC) 10 MG tablet Take 10 mg by mouth at bedtime.   Cholecalciferol (VITAMIN D) 125 MCG (5000 UT) CAPS Take 5,000 Units by mouth every morning.    clindamycin (CLEOCIN T) 1 % lotion Apply 1 application topically daily as needed (eczema).    Coenzyme Q-10 100 MG capsule Take 100 mg by mouth every morning.    diclofenac sodium (VOLTAREN) 1 % GEL Apply 4 g topically 4 (four) times daily as needed (joint pain).    diltiazem  (CARDIZEM CD) 240 MG 24 hr capsule TAKE 1 CAPSULE BY MOUTH EVERY DAY   diltiazem (CARDIZEM) 30 MG tablet Take one tablet by mouth every 4 hours as needed for heart rate greater than 100, top number blood pressure needs to be above 100   docusate sodium (COLACE) 250 MG capsule Take 250 mg by mouth at bedtime.   ELIQUIS 5 MG TABS tablet TAKE 1 TABLET BY MOUTH  TWICE DAILY   fluocinonide (LIDEX) 0.05 % external solution Apply 1 application topically daily as needed (eczema on scalp).    gabapentin (NEURONTIN) 300 MG capsule TAKE 1 CAPSULE BY MOUTH 3  TIMES DAILY   hydroxypropyl methylcellulose / hypromellose (ISOPTO TEARS / GONIOVISC) 2.5 % ophthalmic solution Place 1-2 drops into both eyes 3 (three) times daily as needed (dry/irritated eyes.).   levothyroxine (SYNTHROID) 50 MCG tablet TAKE 1 TABLET BY MOUTH  DAILY   loratadine (CLARITIN) 10 MG tablet Take 10 mg by mouth every morning.    Magnesium Bisglycinate (MAG GLYCINATE PO) Take 400 mg by mouth at bedtime.   methocarbamol (ROBAXIN) 500 MG tablet TAKE 1 TABLET BY MOUTH EVERY 8 HOURS AS NEEDED FOR MUSCLE SPASM   morphine (MS CONTIN) 15 MG 12 hr tablet Take 1 tablet (15 mg total) by mouth 3 (three) times daily. Do Not Fill Before 09/ 15/2023   oxybutynin (DITROPAN-XL) 10 MG 24 hr tablet TAKE 1 TABLET BY MOUTH AT  BEDTIME   oxymetazoline (AFRIN) 0.05 % nasal spray Place 1 spray into both nostrils 2 (two) times daily as needed for congestion.   RABEprazole (ACIPHEX) 20 MG tablet Take 1 tablet (20 mg total) by mouth every evening.   tretinoin (RETIN-A) 0.05 % cream Apply 1 application  topically daily as needed (acne).   Wheat Dextrin (BENEFIBER) CHEW Chew 2 tablets by mouth 2 (two) times daily as needed (regularity).   [DISCONTINUED] flecainide (TAMBOCOR) 100 MG tablet TAKE 1 TABLET BY MOUTH TWICE A DAY     Allergies:   Cyclobenzaprine hcl, Rizatriptan benzoate, Sumatriptan, Tizanidine, Aleve [naproxen sodium], Aspartame, Clarithromycin,  Corn-containing products, Doxycycline, Duloxetine, Egg [eggs or egg-derived products], Keflex [cephalexin], Metronidazole, Monosodium glutamate, Oxycodone hcl, Oxycontin [oxycodone hcl], Penicillins, Rosuvastatin, Venlafaxine, Voltaren [diclofenac sodium], Septra [sulfamethoxazole-trimethoprim], and Tramadol   Social History   Socioeconomic History   Marital status: Married    Spouse name: Not on file   Number of children: Not on file   Years of education: Not on file   Highest education level: Not on file  Occupational History   Occupation: Radiation protection practitioner   Occupation: retired  Tobacco Use   Smoking status: Never   Smokeless tobacco: Never   Tobacco comments:    Never smoke 01/13/22  Vaping Use   Vaping Use: Never used  Substance and Sexual Activity   Alcohol use: Yes    Alcohol/week: 1.0 standard drink of alcohol    Types: 1 Glasses  of wine per week    Comment: rare   Drug use: Never   Sexual activity: Not Currently  Other Topics Concern   Not on file  Social History Narrative   Not on file   Social Determinants of Health   Financial Resource Strain: Low Risk  (05/31/2021)   Overall Financial Resource Strain (CARDIA)    Difficulty of Paying Living Expenses: Not hard at all  Food Insecurity: No Food Insecurity (05/31/2021)   Hunger Vital Sign    Worried About Running Out of Food in the Last Year: Never true    Ran Out of Food in the Last Year: Never true  Transportation Needs: No Transportation Needs (05/31/2021)   PRAPARE - Hydrologist (Medical): No    Lack of Transportation (Non-Medical): No  Physical Activity: Sufficiently Active (05/31/2021)   Exercise Vital Sign    Days of Exercise per Week: 7 days    Minutes of Exercise per Session: 30 min  Stress: No Stress Concern Present (05/31/2021)   [place]    Feeling of Stress : Not at all  Social Connections: [place]  (05/31/2021)   Social Connection and Isolation Panel [NHANES]    Frequency of Communication with Friends and Family: Three times a week    Frequency of Social Gatherings with Friends and Family: More than three times a week    Attends Religious Services: More than 4 times per year    Active Member of Clubs or Organizations: Yes    Attends Music therapist: More than 4 times per year    Marital Status: Married     Family History: The patient's family history includes Allergies in her father and mother; Breast cancer in her mother and paternal aunt; Diabetes in her father and mother; Emphysema in her father; Heart disease in her father and mother. There is no history of Colon cancer, Esophageal cancer, Rectal cancer, or Stomach cancer.  ROS:   Please see the history of present illness.    All other systems reviewed and are negative.  EKGs/Labs/Other Studies Reviewed:    The following studies were reviewed today:  EKG:  The ekg ordered today demonstrates sinus rhythm  Recent Labs: 08/23/2021: ALT 23; TSH 4.09 11/25/2021: BUN 16; Creatinine, Ser 0.70; Hemoglobin 13.1; Platelets 259; Potassium 4.6; Sodium 140  Recent Lipid Panel    Component Value Date/Time   CHOL 196 08/23/2021 1129   TRIG 233.0 (H) 08/23/2021 1129   HDL 45.80 08/23/2021 1129   CHOLHDL 4 08/23/2021 1129   VLDL 46.6 (H) 08/23/2021 1129   LDLCALC 108 (H) 03/12/2020 1325   LDLDIRECT 132.0 08/23/2021 1129    Physical Exam:    VS:  BP 116/60   Pulse 65   Ht 5' 4.5" (1.638 m)   Wt 188 lb (85.3 kg)   BMI 31.77 kg/m     Wt Readings from Last 3 Encounters:  03/16/22 188 lb (85.3 kg)  03/02/22 190 lb (86.2 kg)  01/21/22 197 lb 12.8 oz (89.7 kg)     GEN:  Well nourished, well developed in no acute distress HEENT: Normal NECK: No JVD; No carotid bruits LYMPHATICS: No lymphadenopathy CARDIAC: RRR, no murmurs, rubs, gallops RESPIRATORY:  Clear to auscultation without rales, wheezing or rhonchi   ABDOMEN: Soft, non-tender, non-distended MUSCULOSKELETAL:  No edema; No deformity  SKIN: Warm and dry NEUROLOGIC:  Alert and oriented x 3 PSYCHIATRIC:  Normal affect  ASSESSMENT:    1. Persistent atrial fibrillation ([place])   2. Typical atrial flutter ([place])   3. Primary hypertension    PLAN:    In order of problems listed above:   #Persistent AF - continue apixaban - stop flecainide - continue diltiazem  CHA2DS2-VASc Score = 4  The patient's score is based upon: CHF History: 0 HTN History: 1 Diabetes History: 1 Stroke History: 0 Vascular Disease History: 0 Age Score: 1 Gender Score: 1  #Hypertension At goal today.  Recommend checking blood pressures 1-2 times per week at home and recording the values.  Recommend bringing these recordings to the primary care physician.   Follow up 6 months with APP.     Medication Adjustments/Labs and Tests Ordered: Current medicines are reviewed at length with the patient today.  Concerns regarding medicines are outlined above.  Orders Placed This Encounter  Procedures   EKG 12-Lead   No orders of the defined types were placed in this encounter.    Signed, [name], MD, [facility] 03/16/2022 1:19 PM    Electrophysiology [facility]

## 2022-03-14 NOTE — Telephone Encounter (Signed)
Pharmacy is requesting 90 day supply.

## 2022-03-15 ENCOUNTER — Telehealth: Payer: Self-pay

## 2022-03-15 NOTE — Progress Notes (Signed)
Chronic Care Management Pharmacy Assistant   Name: [name]  MRN: [id number] DOB: February 15, 1951  Reason for Encounter: CCM (Appointment Reminder)  Medications: Outpatient Encounter Medications as of 03/15/2022  Medication Sig   acetaminophen (TYLENOL) 650 MG CR tablet Take 650 mg by mouth in the morning and at bedtime.   B Complex Vitamins (B COMPLEX 100 PO) Take 1 tablet by mouth every morning.  (Patient not taking: Reported on 03/02/2022)   Barberry-Oreg Grape-Goldenseal (BERBERINE COMPLEX PO) Take 1 tablet by mouth in the morning. (Patient not taking: Reported on 03/02/2022)   Calcium Carbonate-Vitamin D (CALCIUM 600+D PO) Take 1 tablet by mouth in the morning and at bedtime.  (Patient not taking: Reported on 03/02/2022)   cetirizine (ZYRTEC) 10 MG tablet Take 10 mg by mouth at bedtime.  (Patient not taking: Reported on 03/02/2022)   Cholecalciferol (VITAMIN D) 125 MCG (5000 UT) CAPS Take 5,000 Units by mouth every morning.    clindamycin (CLEOCIN T) 1 % lotion Apply 1 application topically daily as needed (eczema).    Coenzyme Q-10 100 MG capsule Take 100 mg by mouth every morning.    colchicine 0.6 MG tablet Take 1 tablet (0.6 mg total) by mouth 2 (two) times daily for 5 days.   diclofenac sodium (VOLTAREN) 1 % GEL Apply 4 g topically 4 (four) times daily as needed (joint pain).    diltiazem (CARDIZEM CD) 240 MG 24 hr capsule TAKE 1 CAPSULE BY MOUTH EVERY DAY   diltiazem (CARDIZEM) 30 MG tablet Take one tablet by mouth every 4 hours as needed for heart rate greater than 100, top number blood pressure needs to be above 100   docusate sodium (COLACE) 250 MG capsule Take 250 mg by mouth at bedtime.   ELIQUIS 5 MG TABS tablet TAKE 1 TABLET BY MOUTH  TWICE DAILY   Flaxseed, Linseed, (FLAXSEED OIL) 1200 MG CAPS Take 1,200 mg by mouth every other day.   flecainide (TAMBOCOR) 100 MG tablet TAKE 1 TABLET BY MOUTH TWICE A DAY   fluocinonide (LIDEX) 0.05 % external solution Apply 1 application  topically daily as needed (eczema on scalp).    gabapentin (NEURONTIN) 300 MG capsule TAKE 1 CAPSULE BY MOUTH 3  TIMES DAILY   hydroxypropyl methylcellulose / hypromellose (ISOPTO TEARS / GONIOVISC) 2.5 % ophthalmic solution Place 1-2 drops into both eyes 3 (three) times daily as needed (dry/irritated eyes.).   levothyroxine (SYNTHROID) 50 MCG tablet TAKE 1 TABLET BY MOUTH  DAILY   loratadine (CLARITIN) 10 MG tablet Take 10 mg by mouth every morning.    Magnesium Bisglycinate (MAG GLYCINATE PO) Take 400 mg by mouth at bedtime.   methocarbamol (ROBAXIN) 500 MG tablet TAKE 1 TABLET BY MOUTH EVERY 8 HOURS AS NEEDED FOR MUSCLE SPASM   morphine (MS CONTIN) 15 MG 12 hr tablet Take 1 tablet (15 mg total) by mouth 3 (three) times daily. Do Not Fill Before 09/ 15/2023   oxybutynin (DITROPAN-XL) 10 MG 24 hr tablet TAKE 1 TABLET BY MOUTH AT  BEDTIME   oxymetazoline (AFRIN) 0.05 % nasal spray Place 1 spray into both nostrils 2 (two) times daily as needed for congestion.   pantoprazole (PROTONIX) 40 MG tablet Take 1 tablet (40 mg total) by mouth daily.   RABEprazole (ACIPHEX) 20 MG tablet Take 1 tablet (20 mg total) by mouth every evening.   Resveratrol 100 MG CAPS Take 100 mg by mouth in the morning and at bedtime. (Patient not taking: Reported on 03/02/2022)  tretinoin (RETIN-A) 0.05 % cream Apply 1 application topically daily as needed (acne).  (Patient not taking: Reported on 03/02/2022)   vitamin C (ASCORBIC ACID) 500 MG tablet Take 500 mg by mouth in the morning and at bedtime. (Patient not taking: Reported on 03/02/2022)   Wheat Dextrin (BENEFIBER) CHEW Chew 2 tablets by mouth 2 (two) times daily as needed (regularity).   No facility-administered encounter medications on file as of 03/15/2022.   [name] was contacted to remind of upcoming telephone visit with Charlene Brooke on 03/21/2022 at 3:00. Patient was reminded to have any blood glucose and blood pressure readings available for review at  appointment.   Patient confirmed appointment.  Are you having any problems with your medications? No   Do you have any concerns you like to discuss with the pharmacist? No  CCM referral has been placed prior to visit?  Yes   Star Rating Drugs: Medication:  Last Fill: Day Supply No star rating drugs  Charlene Brooke, CPP notified  Marijean Niemann, [place] Clinical Pharmacy Assistant [phone]

## 2022-03-16 ENCOUNTER — Encounter: Payer: Self-pay | Admitting: Cardiology

## 2022-03-16 ENCOUNTER — Ambulatory Visit: Payer: Medicare Other | Attending: Cardiology | Admitting: Cardiology

## 2022-03-16 VITALS — BP 116/60 | HR 65 | Ht 64.5 in | Wt 188.0 lb

## 2022-03-16 DIAGNOSIS — I10 Essential (primary) hypertension: Secondary | ICD-10-CM

## 2022-03-16 DIAGNOSIS — I483 Typical atrial flutter: Secondary | ICD-10-CM | POA: Diagnosis not present

## 2022-03-16 DIAGNOSIS — I4819 Other persistent atrial fibrillation: Secondary | ICD-10-CM

## 2022-03-16 NOTE — Patient Instructions (Addendum)
Medication Instructions:  Stop Flecainide  *If you need a refill on your cardiac medications before your next appointment, please call your pharmacy*   Lab Work: none If you have labs (blood work) drawn today and your tests are completely normal, you will receive your results only by: [place] (if you have MyChart) OR A paper copy in the mail If you have any lab test that is abnormal or we need to change your treatment, we will call you to review the results.   Testing/Procedures: none   Follow-Up: At [facility], you and your health needs are our priority.  As part of our continuing mission to provide you with exceptional heart care, we have created designated Provider Care Teams.  These Care Teams include your primary Cardiologist (physician) and Advanced Practice Providers (APPs -  Physician Assistants and Nurse Practitioners) who all work together to provide you with the care you need, when you need it.  We recommend signing up for the patient portal called "MyChart".  Sign up information is provided on this After Visit Summary.  MyChart is used to connect with patients for Virtual Visits (Telemedicine).  Patients are able to view lab/test results, encounter notes, upcoming appointments, etc.  Non-urgent messages can be sent to your provider as well.   To learn more about what you can do with MyChart, go to [email or web address].    Your next appointment:   6 month(s)  The format for your next appointment:   In Person  Provider:   You will see one of the following Advanced Practice Providers on your designated Care Team:   Howell Rucks       Other Instructions none  Important Information About Sugar

## 2022-03-21 ENCOUNTER — Ambulatory Visit: Payer: Medicare Other | Admitting: Pharmacist

## 2022-03-21 DIAGNOSIS — I10 Essential (primary) hypertension: Secondary | ICD-10-CM

## 2022-03-21 DIAGNOSIS — I480 Paroxysmal atrial fibrillation: Secondary | ICD-10-CM

## 2022-03-21 DIAGNOSIS — E785 Hyperlipidemia, unspecified: Secondary | ICD-10-CM

## 2022-03-21 DIAGNOSIS — E119 Type 2 diabetes mellitus without complications: Secondary | ICD-10-CM

## 2022-03-21 NOTE — Progress Notes (Signed)
Chronic Care Management Pharmacy Note  03/21/2022 Name:  [name] MRN:  [id number] DOB:  01/25/1951  Summary: CCM F/U visit -Reviewed medications; pt affirms compliance as prescribed, denies issues -LDL 132 (07/2021), worsened from 108. ASCVD risk 19.8% with strong family history of CAD (mother and father). She has failed rosuvastatin due to myalgias, pt is hesitant to try alternatives. Statin therapy is recommended based on diabetes diagnosis, pt defers at this time to work on lifestyle changes.  Recommendations/Changes made from today's visit: -Consider statin or alternative in future for goal LDL < 100 if lifestyle changes do not achieve goal  Plan: -Transition CCM to Self Care: Patient achieved CCM goals and no longer needs to be contacted as frequently. The patient has been provided with contact information for the care management team and has been advised to call with any health related questions or concerns.      Subjective: [name] is an 71 y.o. year old female who is a primary patient of Copland, Frederico Hamman, MD.  The CCM team was consulted for assistance with disease management and care coordination needs.    Engaged with patient by telephone for follow up visit in response to provider referral for pharmacy case management and/or care coordination services.   Consent to Services:  The patient was given information about Chronic Care Management services, agreed to services, and gave verbal consent prior to initiation of services.  Please see initial visit note for detailed documentation.   Patient Care Team: Owens Loffler, MD as PCP - General [name], MD as PCP - Cardiology (Cardiology) Letta Pate Luanna Salk, MD as Consulting Physician (Physical Medicine and Rehabilitation) Armbruster, Carlota Raspberry, MD as Consulting Physician (Gastroenterology) [name], MD as Consulting Physician (Cardiology) Chesley Mires, MD as Consulting Physician (Pulmonary  Disease) Charlton Haws, [facility] as Pharmacist (Pharmacist)  Recent office visits: 09/06/21 Dr Lorelei Pont OV: annual exam - stable conditions. No changes. RTC 6-12 months.  Recent consult visits: 03/16/22 Dr Quentin Ore (Cardiology): Afib - stop flecainide.  08/23/21 NP [name] (PM&R): f/u chronic back pain. Refilled Morphine.  08/23/21 PA Vicie Mutters (GI): hematochezia, UC. Possibly ischemic, scheduled colonoscopy. Add Miralax/Fiber to senokot. Failed Amitiza, Movantik.  07/21/21 Dr Ellyn Hack (cardiology): f/u Afib. No med changes. Low threshold to start statin.  Hospital visits: Medication Reconciliation was completed by comparing discharge summary, patient's EMR and Pharmacy list, and upon discussion with patient.  Admitted to the ED on 09/10/21 due to Afib w/ RVR. Discharge date was 09/10/21. Discharged from [facility].   -converted to NSR spontaneously  New?Medications Started at [facility] Discharge:?? -started Prednisone 40 mg due to cough  Medications that remain the same after Hospital Discharge:??  -All other medications will remain the same.    Admitted to the ED on 08/10/21 due to Colitis. Discharge date was 08/10/21. Discharged from [place]?Medications Started at [facility] Discharge:?? -started Dicyclomine 20 mg BIDdue to colitis  Medications that remain the same after Hospital Discharge:??  -All other medications will remain the same.     Objective:  Lab Results  Component Value Date   CREATININE 0.70 11/25/2021   BUN 16 11/25/2021   GFR 76.11 08/23/2021   GFRNONAA >60 11/25/2021   GFRAA >60 08/29/2019   NA 140 11/25/2021   K 4.6 11/25/2021   CALCIUM 9.8 11/25/2021   CO2 29 11/25/2021   GLUCOSE 113 (H) 11/25/2021    Lab Results  Component Value Date/Time   HGBA1C 6.6 (  H) 08/23/2021 11:29 AM   HGBA1C 7.0 (A) 12/21/2020 02:35 PM   HGBA1C 7.7 (H) 09/14/2020 02:37 PM   GFR 76.11 08/23/2021 11:17 AM   GFR 79.01 09/14/2020 02:37 PM    MICROALBUR 2.6 (H) 08/23/2021 11:29 AM   MICROALBUR 5.5 (H) 03/12/2020 01:47 PM    Last diabetic Eye exam:  Lab Results  Component Value Date/Time   HMDIABEYEEXA No Retinopathy 10/29/2020 12:00 AM    Last diabetic Foot exam: No results found for: "HMDIABFOOTEX"   Lab Results  Component Value Date   CHOL 196 08/23/2021   HDL 45.80 08/23/2021   LDLCALC 108 (H) 03/12/2020   LDLDIRECT 132.0 08/23/2021   TRIG 233.0 (H) 08/23/2021   CHOLHDL 4 08/23/2021       Latest Ref Rng & Units 08/23/2021   11:29 AM 08/23/2021   11:17 AM 08/10/2021   10:46 AM  Hepatic Function  Total Protein 6.0 - 8.3 g/dL 7.9  8.0  8.2   Albumin 3.5 - 5.2 g/dL 4.7  4.8  4.8   AST 0 - 37 U/L 26  26  29    ALT 0 - 35 U/L 23  23  31    Alk Phosphatase 39 - 117 U/L 102  103  126   Total Bilirubin 0.2 - 1.2 mg/dL 0.4  0.4  0.5   Bilirubin, Direct 0.0 - 0.3 mg/dL 0.0       Lab Results  Component Value Date/Time   TSH 4.09 08/23/2021 11:29 AM   TSH 3.52 09/14/2020 02:37 PM   FREET4 0.89 08/23/2021 11:29 AM   FREET4 0.94 09/14/2020 02:37 PM       Latest Ref Rng & Units 11/25/2021    2:36 PM 09/10/2021    3:27 PM 08/23/2021   11:17 AM  CBC  WBC 4.0 - 10.5 K/uL 6.6  7.5  6.6   Hemoglobin 12.0 - 15.0 g/dL 13.1  14.3  13.9   Hematocrit 36.0 - 46.0 % 38.8  39.6  40.8   Platelets 150 - 400 K/uL 259  281  291.0     Lab Results  Component Value Date/Time   VD25OH 44.28 08/23/2021 11:29 AM   VD25OH 33.52 03/12/2020 01:25 PM    Clinical ASCVD: No  The 10-year ASCVD risk score (Arnett DK, et al., 2019) is: 19.8%   Values used to calculate the score:     Age: 23 years     Sex: Female     Is Non-Hispanic African American: No     Diabetic: Yes     Tobacco smoker: No     Systolic Blood Pressure: 163 mmHg     Is BP treated: Yes     HDL Cholesterol: 45.8 mg/dL     Total Cholesterol: 196 mg/dL       03/02/2022   10:48 AM 01/21/2022    1:17 PM 01/05/2022   11:34 AM  Depression screen PHQ 2/9  Decreased Interest  0 0 0  Down, Depressed, Hopeless 0 0 0  PHQ - 2 Score 0 0 0     CHA2DS2/VAS Stroke Risk Points  Current as of 3 days ago     5 >= 2 Points: High Risk  1 - 1.99 Points: Medium Risk  0 Points: Low Risk    Last Change: N/A      Points Metrics  0 Has Congestive Heart Failure:  No    Current as of 3 days ago  1 Has Vascular Disease:  Yes    Current  as of 3 days ago  1 Has Hypertension:  Yes    Current as of 3 days ago  1 Age:  56    Current as of 3 days ago  1 Has Diabetes:  Yes    Current as of 3 days ago  0 Had Stroke:  No  Had TIA:  No  Had Thromboembolism:  No    Current as of 3 days ago  1 Female:  Yes    Current as of 3 days ago    Social History   Tobacco Use  Smoking Status Never  Smokeless Tobacco Never  Tobacco Comments   Never smoke 01/13/22   BP Readings from Last 3 Encounters:  03/16/22 116/60  03/02/22 121/69  01/21/22 112/66   Pulse Readings from Last 3 Encounters:  03/16/22 65  03/02/22 71  01/21/22 61   Wt Readings from Last 3 Encounters:  03/16/22 188 lb (85.3 kg)  03/02/22 190 lb (86.2 kg)  01/21/22 197 lb 12.8 oz (89.7 kg)   BMI Readings from Last 3 Encounters:  03/16/22 31.77 kg/m  03/02/22 32.61 kg/m  01/21/22 33.95 kg/m    Assessment/Interventions: Review of patient past medical history, allergies, medications, health status, including review of consultants reports, laboratory and other test data, was performed as part of comprehensive evaluation and provision of chronic care management services.   SDOH:  (Social Determinants of Health) assessments and interventions performed: No - performed 05/2021 at [place] Management from 09/14/2020 in [place] at [place] from 02/12/2020 in [place] at [facility] Visit from 06/19/2019 in [place] and Rehabilitation Clinical Support from 01/18/2019 in [place] at [place] from 01/10/2018 in [place] at [place]  SDOH Interventions       Depression Interventions/Treatment  -- YFV4-9 Score <4 Follow-up Not Indicated Medication PHQ2-9 Score <4 Follow-up Not Indicated, Currently on Treatment --  [referral to PCP]  Financial Strain Interventions Intervention Not Indicated  [Meds affordable] -- -- -- --      SDOH Screenings   Food Insecurity: No Food Insecurity (05/31/2021)  Housing: Low Risk  (05/31/2021)  Transportation Needs: No Transportation Needs (05/31/2021)  Alcohol Screen: Low Risk  (05/31/2021)  Depression (PHQ2-9): Low Risk  (03/02/2022)  Financial Resource Strain: Low Risk  (05/31/2021)  Physical Activity: Sufficiently Active (05/31/2021)  Social Connections: Socially Integrated (05/31/2021)  Stress: No Stress Concern Present (05/31/2021)  Tobacco Use: Low Risk  (03/16/2022)    CCM Care Plan  Allergies  Allergen Reactions   Cyclobenzaprine Hcl Anaphylaxis and Swelling    Reaction - throat closed, tongue swelling   Rizatriptan Benzoate Anaphylaxis    Throat and Tongue Swelling Closed   Sumatriptan Swelling    Throat closed & Tongue Swelling   Tizanidine Shortness Of Breath   Aleve [Naproxen Sodium] Hives   Aspartame Other (See Comments)   Clarithromycin Hives   Corn-Containing Products Other (See Comments)    Head congestion   Doxycycline Diarrhea and Nausea And Vomiting   Duloxetine Nausea Only   Egg [Eggs Or Egg-Derived Products] Diarrhea and Nausea And Vomiting   Keflex [Cephalexin] Itching and Other (See Comments)    Sores   Metronidazole Diarrhea and Nausea Only   Monosodium Glutamate Diarrhea and Other (See Comments)   Oxycodone Hcl Diarrhea and Nausea And Vomiting   Oxycontin [Oxycodone Hcl] Nausea And Vomiting   Penicillins Other (See Comments)  Unknown reaction Did it involve swelling of the face/tongue/throat, SOB, or low BP? Unknown Did it involve sudden or severe rash/hives, skin peeling, or any  reaction on the inside of your mouth or nose? Unknown Did you need to seek medical attention at a hospital or doctor's office? Unknown When did it last happen?   infant   If all above answers are "NO", may proceed with cephalosporin use.   Rosuvastatin Other (See Comments)    Muscle aches   Venlafaxine Hives   Voltaren [Diclofenac Sodium] Hives    Reaction to oral med, uses gel occasionally   Septra [Sulfamethoxazole-Trimethoprim] Itching and Rash   Tramadol Diarrhea, Nausea And Vomiting and Palpitations    Medications Reviewed Today     Reviewed by Talbert Nan (Clinical Support Staff) on 03/16/22 at 1108  Med List Status: <None>   Medication Order Taking? Sig Documenting Provider Last Dose Status Informant  acetaminophen (TYLENOL) 650 MG CR tablet [id number] Yes Take 650 mg by mouth in the morning and at bedtime. [provider] Taking Active Self  B Complex Vitamins (B COMPLEX 100 PO) [id number] No Take 1 tablet by mouth every morning.   Patient not taking: Reported on 03/02/2022   [provider] Not Taking Active Self  Barberry-Oreg Grape-Goldenseal (BERBERINE COMPLEX PO) [id number] No Take 1 tablet by mouth in the morning.  Patient not taking: Reported on 03/02/2022   [provider] Not Taking Active Self  Calcium Carbonate-Vitamin D (CALCIUM 600+D PO) [id number] No Take 1 tablet by mouth in the morning and at bedtime.   Patient not taking: Reported on 03/02/2022   [provider] Not Taking Active Self  cetirizine (ZYRTEC) 10 MG tablet [id number] Yes Take 10 mg by mouth at bedtime. [provider] Taking Active Self  Cholecalciferol (VITAMIN D) 125 MCG (5000 UT) CAPS [id number] Yes Take 5,000 Units by mouth every morning.  [provider] Taking Active Self  clindamycin (CLEOCIN T) 1 % lotion [id number] Yes Apply 1 application topically daily as needed (eczema).  [provider] Taking Active Self  Coenzyme Q-10 100 MG capsule  [id number] Yes Take 100 mg by mouth every morning.  [provider] Taking Active Self  diclofenac sodium (VOLTAREN) 1 % GEL [id number] Yes Apply 4 g topically 4 (four) times daily as needed (joint pain).  [provider] Taking Active Self  diltiazem (CARDIZEM CD) 240 MG 24 hr capsule [id number] Yes TAKE 1 CAPSULE BY MOUTH EVERY DAY [name], MD Taking Active Self  diltiazem (CARDIZEM) 30 MG tablet [id number] Yes Take one tablet by mouth every 4 hours as needed for heart rate greater than 100, top number blood pressure needs to be above 100 Fenton, Clint R, PA Taking Active Self  docusate sodium (COLACE) 250 MG capsule [id number] Yes Take 250 mg by mouth at bedtime. [provider] Taking Active Self  ELIQUIS 5 MG TABS tablet [id number] Yes TAKE 1 TABLET BY MOUTH  TWICE DAILY [name], MD Taking Active Self  Flaxseed, Linseed, (FLAXSEED OIL) 1200 MG CAPS [id number] No Take 1,200 mg by mouth every other day.  Patient not taking: Reported on 03/16/2022   [provider] Not Taking Active Self  flecainide (TAMBOCOR) 100 MG tablet [id number] Yes TAKE 1 TABLET BY MOUTH TWICE A DAY Fenton, Clint R, PA Taking Active   fluocinonide (LIDEX) 0.05 % external solution [id number] Yes Apply 1 application topically daily as needed (eczema on scalp).  [provider] Taking Active Self  gabapentin (NEURONTIN) 300 MG capsule [id number] Yes TAKE 1 CAPSULE BY MOUTH 3  TIMES DAILY Kirsteins, Luanna Salk, MD Taking Active   hydroxypropyl methylcellulose / hypromellose (ISOPTO TEARS / GONIOVISC) 2.5 % ophthalmic solution [id number] Yes Place 1-2 drops into both eyes 3 (three) times daily as needed (dry/irritated eyes.). [provider] Taking Active Self  levothyroxine (SYNTHROID) 50 MCG tablet [id number] Yes TAKE 1 TABLET BY MOUTH  DAILY Copland, Spencer, MD Taking Active Self  loratadine (CLARITIN) 10 MG tablet [id number] Yes Take 10 mg by mouth every morning.   [provider] Taking Active Self  Magnesium Bisglycinate (MAG GLYCINATE PO) [id number] Yes Take 400 mg by mouth at bedtime. [provider] Taking Active Self  methocarbamol (ROBAXIN) 500 MG tablet [id number] Yes TAKE 1 TABLET BY MOUTH EVERY 8 HOURS AS NEEDED FOR MUSCLE SPASM Bayard Hugger, NP Taking Active   morphine (MS CONTIN) 15 MG 12 hr tablet [id number] Yes Take 1 tablet (15 mg total) by mouth 3 (three) times daily. Do Not Fill Before 09/ 15/2023 Bayard Hugger, NP Taking Active   oxybutynin (DITROPAN-XL) 10 MG 24 hr tablet [id number] Yes TAKE 1 TABLET BY MOUTH AT  BEDTIME Copland, Spencer, MD Taking Active Self  oxymetazoline (AFRIN) 0.05 % nasal spray [id number] Yes Place 1 spray into both nostrils 2 (two) times daily as needed for congestion. [provider] Taking Active Self  RABEprazole (ACIPHEX) 20 MG tablet [id number] Yes Take 1 tablet (20 mg total) by mouth every evening. Owens Loffler, MD Taking Active   Resveratrol 100 MG CAPS [id number] No Take 100 mg by mouth in the morning and at bedtime.  Patient not taking: Reported on 03/02/2022   [provider] Not Taking Active Self  tretinoin (RETIN-A) 0.05 % cream [id number] Yes Apply 1 application  topically daily as needed (acne). [provider] Taking Active Self  vitamin C (ASCORBIC ACID) 500 MG tablet [id number] No Take 500 mg by mouth in the morning and at bedtime.  Patient not taking: Reported on 03/02/2022   [provider] Not Taking Active Self  Wheat Dextrin [facility]) CHEW [id number] Yes Chew 2 tablets by mouth 2 (two) times daily as needed (regularity). 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 08/29/2011   Postlaminectomy syndrome, cervical region 08/29/2011   Depression, major, recurrent, in partial remission ([place]) 09/22/2008   ALLERGIC RHINITIS 09/22/2008   Hypothyroidism 05/29/2007   Hyperlipidemia with target LDL less than 100 05/29/2007   Vitamin D deficiency 01/11/2007   FIBROMYALGIA 01/11/2007   Vitamin B 12 deficiency 11/13/2006   Essential hypertension 11/13/2006   GERD 11/13/2006    Immunization History  Administered Date(s) Administered   PFIZER(Purple Top)SARS-COV-2 Vaccination 08/22/2019, 09/18/2019   Pneumococcal Conjugate-13 01/02/2017   Pneumococcal Polysaccharide-23 05/06/2013, 07/25/2018   Td 06/28/2003, 10/16/2013   Zoster, Live 11/07/2012    Conditions to be addressed/monitored:  Hypertension, Hyperlipidemia, Diabetes, and Atrial Fibrillation, Chronic pain, constipation  There are no care plans that you recently modified to display  for this patient.     Medication Assistance: None required.  Patient affirms current coverage meets needs.  Compliance/Adherence/Medication fill history: Care Gaps: Foot exam (due 11/30/18) Cologuard (due 08/05/21) Eye exam (due 10/29/21)  Star-Rating Drugs: None  Medication Access: Within the past 30 days, how often has patient missed a dose of medication? 0 Is a pillbox or other method used to improve adherence? Yes  Factors that may affect medication adherence? no barriers identified Are meds synced by current pharmacy? No  Are meds delivered by current pharmacy? Yes  Does patient experience delays in picking up medications due to transportation concerns? No   Upstream Services Reviewed: Is patient disadvantaged to use [facility]?: Yes   Current Rx insurance plan: [facility] Name and location of Current pharmacy[facility]  [id number] [place] [place]Phone: [phone]: [phone] [facility] [address] [facility] [address]Phone: [phone]: [phone] [facility] [place] [address]Phone: [phone]: [phone] [facility] services reviewed with patient today?: No  Patient requests to transfer care to [facility]?: No  Reason patient declined to change pharmacies: Disadvantaged due to insurance/mail order   Care Plan and Follow Up Patient Decision:  Patient agrees to Care Plan and Follow-up.  Plan: The patient has been provided with contact information for the care management team and has been advised to call with any health related questions or concerns.   LCharlene Brooke PharmD, BCACP Clinical Pharmacist [phone]

## 2022-03-21 NOTE — Patient Instructions (Addendum)
Visit Information  Phone number for Pharmacist: [phone]   Goals Addressed   None     Care Plan : [place]  Updates made by Charlton Haws, RPH since 03/21/2022 12:00 AM     Problem: Hypertension, Hyperlipidemia, Diabetes, and Atrial Fibrillation, Chronic pain, constipation   Priority: High     Long-Range Goal: Disease mgmt   Start Date: 09/14/2020  Expected End Date: 03/21/2022  This Visit's Progress: On track  Recent Progress: On track  Priority: High  Note:   Current Barriers:  None identified  Pharmacist Clinical Goal(s):  Patient will contact provider office for questions/concerns as evidenced notation of same in electronic health record through collaboration with PharmD and provider.   Interventions: 1:1 collaboration with Owens Loffler, MD regarding development and update of comprehensive plan of care as evidenced by provider attestation and co-signature Inter-disciplinary care team collaboration (see longitudinal plan of care) Comprehensive medication review performed; medication list updated in electronic medical record  Atrial Fibrillation (Goal: prevent stroke and major bleeding) -Controlled - pt recently stopped flecainide (per cardiologist), denies issues so far -s/p ablation 11/2021 -Home BP and HR readings:  -Current treatment: Diltiazem 240 mg daily - Appropriate, Effective, Safe, Accessible Diltiazem 30 mg PRN HR > 100 - Appropriate, Effective, Safe, Accessible Eliquis 5 mg BID - Appropriate, Effective, Safe, Accessible -Previously tried/failed medications: metoprolol, flecainide -Counseled on increased risk of stroke due to Afib and benefits of anticoagulation for stroke prevention; importance of adherence to anticoagulant exactly as prescribed; -Recommended to continue current medication  Hyperlipidemia: (LDL goal < 100) -Not ideally controlled - LDL 132 (07/2021), worsened from 108; ASCVD risk 15.8%; she has been hesitant to  try other statins before due to polypharmacy concerns and hx of leg pain with Crestor -Previously Discussed FH - Father passed at age 26 after multiple heart attacks. He smoked for 50+, poor diet, obese, etc. Mother MI in her 30s.  -Current treatment: CoQ10 -Medications previously tried: rosuvastatin (leg pain) -We discussed lowering her risk with weight loss, DM control, exercise -Educated on Cholesterol goals; Benefits of statin for ASCVD risk reduction; -Recommended statin therapy previously, pt deferred  Diabetes (A1c goal <7%) -Diet-Controlled - A1c 6.6% (07/2021) improved -She has a tendency to have low sugars even without medication -Medications previously tried: metformin - BG dropped too low  -Current meal patterns: tries to avoid snacking, eats vegetables and fruits, water and black coffee  -Current exercise: minimal, hopes to get out and walk after foot surgery -Educated on A1c and blood sugar goals; -Counseled on diet and exercise extensively  Chronic pain (Goal: manage pain) -Controlled - per pt report -Follows with pain mgmt (PM&R) for cervicalgia -Current treatment  Morphine ER 15 mg TID - Appropriate, Effective, Safe, Accessible Tylenol 650 mg BID- Appropriate, Effective, Safe, Accessible Voltaren gel - Appropriate, Effective, Safe, Accessible -Medications previously tried: n/a  -Recommended to continue current medication  GI (Goal: manage symptoms) -Controlled - improvement in constipation with addition of Miralax, Benefiber -Hx UC, OIC -Current treatment  Dicyclomine 20 mg BID prn - Appropriate, Effective, Safe, Accessible Miralax - Appropriate, Effective, Safe, Accessible Senna 8.6 mg - Appropriate, Effective, Safe, Accessible Benefiber chew - Appropriate, Effective, Safe, Accessible -Medications previously tried: Amitiza, Movantik  -Recommended to continue current medication  Health Maintenance -Vaccine gaps: Flu, Shingrix -Discussed Shingrix vaccine is  free at pharmacy   Patient Goals/Self-Care Activities Patient will:  - take medications as prescribed as evidenced by patient report and record review focus on medication  adherence by routine engage in dietary modifications by limit cholesterol       Patient verbalizes understanding of instructions and care plan provided today and agrees to view in [place]. Active MyChart status and patient understanding of how to access instructions and care plan via MyChart confirmed with patient.    The patient has been provided with contact information for the care management team and has been advised to call with any health related questions or concerns.   Charlene Brooke, PharmD, BCACP Clinical Pharmacist [place] [facility] at [facility] [phone]

## 2022-04-04 DIAGNOSIS — H1033 Unspecified acute conjunctivitis, bilateral: Secondary | ICD-10-CM | POA: Diagnosis not present

## 2022-04-06 ENCOUNTER — Encounter: Payer: Self-pay | Admitting: Registered Nurse

## 2022-04-06 ENCOUNTER — Encounter: Payer: Medicare Other | Attending: Physical Medicine & Rehabilitation | Admitting: Registered Nurse

## 2022-04-06 VITALS — BP 112/72 | HR 65 | Ht 64.5 in | Wt 188.0 lb

## 2022-04-06 DIAGNOSIS — G8929 Other chronic pain: Secondary | ICD-10-CM | POA: Diagnosis not present

## 2022-04-06 DIAGNOSIS — M546 Pain in thoracic spine: Secondary | ICD-10-CM | POA: Insufficient documentation

## 2022-04-06 DIAGNOSIS — Z5181 Encounter for therapeutic drug level monitoring: Secondary | ICD-10-CM | POA: Insufficient documentation

## 2022-04-06 DIAGNOSIS — M1712 Unilateral primary osteoarthritis, left knee: Secondary | ICD-10-CM | POA: Insufficient documentation

## 2022-04-06 DIAGNOSIS — Z79891 Long term (current) use of opiate analgesic: Secondary | ICD-10-CM | POA: Diagnosis not present

## 2022-04-06 DIAGNOSIS — G894 Chronic pain syndrome: Secondary | ICD-10-CM | POA: Diagnosis not present

## 2022-04-06 MED ORDER — MORPHINE SULFATE ER 15 MG PO TBCR: 15.0000 mg | EXTENDED_RELEASE_TABLET | Freq: Three times a day (TID) | ORAL | 0 refills | Status: DC

## 2022-04-06 NOTE — Progress Notes (Signed)
Subjective:    Patient ID: [name], female    DOB: 06/14/1951, 71 y.o.   MRN: [id number]  HPI: [name] is a 71 y.o. female who returns for follow up appointment for chronic pain and medication refill. She states her pain is located in her mid- back and left knee. She rates her pain 3. Her current exercise regime is walking and performing stretching exercises.  [name] Morphine equivalent is 45.00 MME.   Last UDS was Performed on 03/02/2022, it was consistent.      Pain Inventory Average Pain 4 Pain Right Now 3 My pain is constant, burning, and aching  In the last 24 hours, has pain interfered with the following? General activity 6 Relation with others 5 Enjoyment of life 4 What TIME of day is your pain at its worst? evening and night Sleep (in general) Good  Pain is worse with: walking, bending, standing, and some activites Pain improves with: rest, heat/ice, and medication Relief from Meds: 5      Family History  Problem Relation Age of Onset   Breast cancer Mother    Diabetes Mother    Heart disease Mother    Allergies Mother    Diabetes Father    Heart disease Father    Emphysema Father    Allergies Father    Breast cancer Paternal Aunt    Colon cancer Neg Hx    Esophageal cancer Neg Hx    Rectal cancer Neg Hx    Stomach cancer Neg Hx    Social History   Socioeconomic History   Marital status: Married    Spouse name: Not on file   Number of children: Not on file   Years of education: Not on file   Highest education level: Not on file  Occupational History   Occupation: Radiation protection practitioner   Occupation: retired  Tobacco Use   Smoking status: Never   Smokeless tobacco: Never   Tobacco comments:    Never smoke 01/13/22  Vaping Use   Vaping Use: Never used  Substance and Sexual Activity   Alcohol use: Yes    Alcohol/week: 1.0 standard drink of alcohol    Types: 1 Glasses of wine per week    Comment: rare   Drug use: Never   Sexual  activity: Not Currently  Other Topics Concern   Not on file  Social History Narrative   Not on file   Social Determinants of Health   Financial Resource Strain: Low Risk  (05/31/2021)   Overall Financial Resource Strain (CARDIA)    Difficulty of Paying Living Expenses: Not hard at all  Food Insecurity: No Food Insecurity (05/31/2021)   Hunger Vital Sign    Worried About Running Out of Food in the Last Year: Never true    Ran Out of Food in the Last Year: Never true  Transportation Needs: No Transportation Needs (05/31/2021)   PRAPARE - Hydrologist (Medical): No    Lack of Transportation (Non-Medical): No  Physical Activity: Sufficiently Active (05/31/2021)   Exercise Vital Sign    Days of Exercise per Week: 7 days    Minutes of Exercise per Session: 30 min  Stress: No Stress Concern Present (05/31/2021)   [place]    Feeling of Stress : Not at all  Social Connections: [place] (05/31/2021)   Social Connection and Isolation Panel [NHANES]    Frequency of Communication with Friends  and Family: Three times a week    Frequency of Social Gatherings with Friends and Family: More than three times a week    Attends Religious Services: More than 4 times per year    Active Member of Clubs or Organizations: Yes    Attends Archivist Meetings: More than 4 times per year    Marital Status: Married   Past Surgical History:  Procedure Laterality Date   24 HOUR [place] STUDY N/A 01/04/2016   Procedure: 24 HOUR PH STUDY;  Surgeon: Manus Gunning, MD;  Location: [facility];  Service: Gastroenterology;  Laterality: N/A;   ABDOMINAL HYSTERECTOMY  1988   ADENOIDECTOMY     ANTERIOR CERVICAL DECOMP/DISCECTOMY FUSION  2009, 1993   first procedure in ~ 1993/Dr [place] N/A 12/16/2021   Procedure: ATRIAL FIBRILLATION ABLATION;   Surgeon: Vickie Epley, MD;  Location: [place] [facility];  Service: Cardiovascular;  Laterality: N/A;   BACK SURGERY     CARDIOVERSION N/A 04/03/2019   Procedure: CARDIOVERSION;  Surgeon: Dorothy Spark, MD;  Location: [facility] ENDOSCOPY;  Service: Cardiovascular;  Laterality: N/A;   CHOLECYSTECTOMY  2005   ESOPHAGEAL MANOMETRY N/A 01/04/2016   Procedure: ESOPHAGEAL MANOMETRY (EM) 24 HR PH;  Surgeon: Manus Gunning, MD;  Location: [facility];  Service: Gastroenterology;  Laterality: N/A;   ESOPHAGOGASTRODUODENOSCOPY (EGD) WITH PROPOFOL N/A 05/17/2016   Procedure: ESOPHAGOGASTRODUODENOSCOPY (EGD) WITH PROPOFOL;  Surgeon: Manus Gunning, MD;  Location: [facility];  Service: Gastroenterology;  Laterality: N/A;   NASAL SINUS SURGERY  2006   SAVORY DILATION N/A 05/17/2016   Procedure: SAVORY DILATION;  Surgeon: Manus Gunning, MD;  Location: [facility];  Service: Gastroenterology;  Laterality: N/A;   TEE WITHOUT CARDIOVERSION N/A 04/03/2019   Procedure: TRANSESOPHAGEAL ECHOCARDIOGRAM (TEE)-with DCCV;  Surgeon: Dorothy Spark, MD;  Location: [facility] ENDOSCOPY;; EF 55 to 60%.  No LVH.  Normal RV.  Moderate LA dilation.  No thrombus.  Mild RA dilation.  Mild to moderate TR.  Mild aortic valve sclerosis, no stenosis.  Moderately elevated PA pressures (47 mmHg)   TONSILLECTOMY AND ADENOIDECTOMY     TRANSTHORACIC ECHOCARDIOGRAM  2009   Normal LV size and function. EF 60-65%. No regional wall motion and amount is. Mild aortic sclerosis, no stenosis   TUBAL LIGATION  1979   Past Medical History:  Diagnosis Date   B12 deficiency    Complication of anesthesia    slow to wake up    Depression    Diabetes mellitus type 2, diet-controlled ([place]) 07/24/2018   Fibromyalgia    Generalized hyperhidrosis 04/17/2014   GERD (gastroesophageal reflux disease)    History of rheumatic fever as a child    Hyperlipemia    Hypertension    denies htn on 05/09/16   HYPOTHYROIDISM 05/29/2007    Irritable bowel syndrome    Migraines    hx of    OSA (obstructive sleep apnea) 06/26/2019   Senile calcific aortic valve sclerosis    Aortic Sclerosis-no stenosis   There were no vitals taken for this visit.  Opioid Risk Score:   Fall Risk Score:  `1  Depression screen [facility] 2/9     03/02/2022   10:48 AM 01/21/2022    1:17 PM 01/05/2022   11:34 AM 12/07/2021   11:39 AM 08/23/2021    2:14 PM 07/16/2021   10:25 AM 07/12/2021    1:20 PM  Depression screen PHQ 2/9  Decreased  Interest 0 0 0 0 0 0 0  Down, Depressed, Hopeless 0 0 0 0 0 0 1  PHQ - 2 Score 0 0 0 0 0 0 1    Review of Systems  Musculoskeletal:  Positive for back pain and neck stiffness.       Left knee pain  All other systems reviewed and are negative.      Objective:   Physical Exam Vitals and nursing note reviewed.  Constitutional:      Appearance: Normal appearance.  Cardiovascular:     Rate and Rhythm: Normal rate and regular rhythm.     Pulses: Normal pulses.     Heart sounds: Normal heart sounds.  Pulmonary:     Effort: Pulmonary effort is normal.     Breath sounds: Normal breath sounds.  Musculoskeletal:     Cervical back: Normal range of motion and neck supple.     Comments: Normal Muscle Bulk and Muscle Testing Reveals:  Upper Extremities: Full ROM and Muscle Strength 5/5 Thoracic Paraspinal Tenderness: T-7-T-9 Lower Extremities: Full ROM and Muscle Strength 5/5 Arises from Table with Ease Narrow Based  Gait     Skin:    General: Skin is warm and dry.  Neurological:     Mental Status: She is alert and oriented to person, place, and time.  Psychiatric:        Mood and Affect: Mood normal.        Behavior: Behavior normal.         Assessment & Plan:  1. Cervical postlaminectomy syndrome:/ Cervicalgia/ Cervical Radiculitis/ with neck and shoulder pain/ Chronic Midline Thoracic Pain.  Continue exercise routine and using heat therapy. 04/06/2022 Continue : current medication  regimen. Refilled: MS Contin 15 mg one tablet three times a day #90.   We will continue the opioid monitoring program, this consists of regular clinic visits, examinations, urine drug screen, pill counts as well as use of [place] Controlled Substance Reporting system. A 12 month History has been reviewed on the [place] on 04/06/2022.   2. Fibromyalgia/ Neuropathy: Continue current medication regimen with Gabapentin, activity and exercise regime. 04/06/2022. 3. Bilateral Knee Pain/ Mild Degenerative Changes L>R: S/P Left Knee Injection on 07/16/2021, with good relief noted.  Continue current medication regimen with Voltaren Gel. 04/06/2022. 4. Bilateral  Ankle Pain:  No complaints today. Continue HEP as tolerated. Continue to monitor.04/06/2022. 5. Muscle Spasm: Continue current medication regimen with  Robaxin. Continue to Monitor.04/06/2022.. 6. Opioid Induced Constipation: No complaints Continue to Monitor. 04/06/2022. 7. Midline Low Back Pain: Continue current medication regime, continue HEP as tolerated. 04/06/2022. 8. Right Gluteal Medius Weakness and Atrophy: No complaints Today:Continue with Hip Abduction Exercises as tolerated: Continue to Monitor. 04/06/2022. 9. Right Hip Pain/ Right Greater Trochanteric Tenderness: No complaints today. Continue to Alternate Ice and Heat Therapy. Continue to Monitor. 04/06/2022  10. Sacroiliac Joint Pain:  S/P Sacroiliac Injection with good relief noted. 04/06/2022 11. Lumbar Radiculitis: No complaints today.Continue Gabapentin. Continue HEP as tolerated. Continue current medication regimen. Continue to monitor.  04/06/2022.     F/U in 1 month

## 2022-04-20 ENCOUNTER — Ambulatory Visit: Payer: Medicare Other | Admitting: Cardiology

## 2022-04-22 ENCOUNTER — Encounter: Payer: Self-pay | Admitting: Physical Medicine & Rehabilitation

## 2022-04-22 ENCOUNTER — Encounter: Payer: Medicare Other | Admitting: Physical Medicine & Rehabilitation

## 2022-04-22 VITALS — BP 114/65 | HR 62 | Ht 64.5 in | Wt 186.0 lb

## 2022-04-22 DIAGNOSIS — G8929 Other chronic pain: Secondary | ICD-10-CM

## 2022-04-22 DIAGNOSIS — Z5181 Encounter for therapeutic drug level monitoring: Secondary | ICD-10-CM | POA: Diagnosis not present

## 2022-04-22 DIAGNOSIS — M546 Pain in thoracic spine: Secondary | ICD-10-CM | POA: Diagnosis not present

## 2022-04-22 DIAGNOSIS — M1712 Unilateral primary osteoarthritis, left knee: Secondary | ICD-10-CM | POA: Diagnosis not present

## 2022-04-22 DIAGNOSIS — G894 Chronic pain syndrome: Secondary | ICD-10-CM | POA: Diagnosis not present

## 2022-04-22 DIAGNOSIS — Z79891 Long term (current) use of opiate analgesic: Secondary | ICD-10-CM | POA: Diagnosis not present

## 2022-04-22 MED ORDER — MORPHINE SULFATE ER 15 MG PO TBCR: 15.0000 mg | EXTENDED_RELEASE_TABLET | Freq: Three times a day (TID) | ORAL | 0 refills | Status: DC

## 2022-04-22 NOTE — Progress Notes (Signed)
Subjective:    Patient ID: [name], female    DOB: 09/11/1950, 71 y.o.   MRN: [id number]  HPI 71 yo female with chronic midback and neck pain , hx ACDF C4-6.  She has been managed well with MS Contin '15mg'$  TID but has had increaseing Lef tknee pain .  Last Xrays dated 04/06/21 demonstrated left knee medial compartment OA changes.  She underwent Left knee injection with celestone ~73moago with only 3wks of relief.  SHe is here to discuss treatment options .  The patient does not have any range of motion limitations in the left knee.  She has pain with ambulation that is not relieved by her opioid medications.   Pain Inventory Average Pain 5 Pain Right Now 6 My pain is constant, burning, tingling, and aching  In the last 24 hours, has pain interfered with the following? General activity 7 Relation with others 6 Enjoyment of life 5 What TIME of day is your pain at its worst? evening and night Sleep (in general) Good  Pain is worse with: walking, bending, standing, and some activites Pain improves with: rest, heat/ice, medication, and injections Relief from Meds: 4  Family History  Problem Relation Age of Onset   Breast cancer Mother    Diabetes Mother    Heart disease Mother    Allergies Mother    Diabetes Father    Heart disease Father    Emphysema Father    Allergies Father    Breast cancer Paternal Aunt    Colon cancer Neg Hx    Esophageal cancer Neg Hx    Rectal cancer Neg Hx    Stomach cancer Neg Hx    Social History   Socioeconomic History   Marital status: Married    Spouse name: Not on file   Number of children: Not on file   Years of education: Not on file   Highest education level: Not on file  Occupational History   Occupation: BRadiation protection practitioner  Occupation: retired  Tobacco Use   Smoking status: Never   Smokeless tobacco: Never   Tobacco comments:    Never smoke 01/13/22  Vaping Use   Vaping Use: Never used  Substance and Sexual Activity    Alcohol use: Yes    Alcohol/week: 1.0 standard drink of alcohol    Types: 1 Glasses of wine per week    Comment: rare   Drug use: Never   Sexual activity: Not Currently  Other Topics Concern   Not on file  Social History Narrative   Not on file   Social Determinants of Health   Financial Resource Strain: Low Risk  (05/31/2021)   Overall Financial Resource Strain (CARDIA)    Difficulty of Paying Living Expenses: Not hard at all  Food Insecurity: No Food Insecurity (05/31/2021)   Hunger Vital Sign    Worried About Running Out of Food in the Last Year: Never true    Ran Out of Food in the Last Year: Never true  Transportation Needs: No Transportation Needs (05/31/2021)   PRAPARE - THydrologist(Medical): No    Lack of Transportation (Non-Medical): No  Physical Activity: Sufficiently Active (05/31/2021)   Exercise Vital Sign    Days of Exercise per Week: 7 days    Minutes of Exercise per Session: 30 min  Stress: No Stress Concern Present (05/31/2021)   [place]   Feeling of Stress :  Not at all  Social Connections: Socially Integrated (05/31/2021)   Social Connection and Isolation Panel [NHANES]    Frequency of Communication with Friends and Family: Three times a week    Frequency of Social Gatherings with Friends and Family: More than three times a week    Attends Religious Services: More than 4 times per year    Active Member of Clubs or Organizations: Yes    Attends Archivist Meetings: More than 4 times per year    Marital Status: Married   Past Surgical History:  Procedure Laterality Date   24 HOUR [place] STUDY N/A 01/04/2016   Procedure: 24 HOUR PH STUDY;  Surgeon: Manus Gunning, MD;  Location: [facility];  Service: Gastroenterology;  Laterality: N/A;   ABDOMINAL HYSTERECTOMY  1988   ADENOIDECTOMY     ANTERIOR CERVICAL DECOMP/DISCECTOMY FUSION  2009, 1993   first  procedure in ~ 1993/Dr [place] N/A 12/16/2021   Procedure: ATRIAL FIBRILLATION ABLATION;  Surgeon: Vickie Epley, MD;  Location: [facility];  Service: Cardiovascular;  Laterality: N/A;   BACK SURGERY     CARDIOVERSION N/A 04/03/2019   Procedure: CARDIOVERSION;  Surgeon: Dorothy Spark, MD;  Location: [facility] ENDOSCOPY;  Service: Cardiovascular;  Laterality: N/A;   CHOLECYSTECTOMY  2005   ESOPHAGEAL MANOMETRY N/A 01/04/2016   Procedure: ESOPHAGEAL MANOMETRY (EM) 24 HR PH;  Surgeon: Manus Gunning, MD;  Location: [facility];  Service: Gastroenterology;  Laterality: N/A;   ESOPHAGOGASTRODUODENOSCOPY (EGD) WITH PROPOFOL N/A 05/17/2016   Procedure: ESOPHAGOGASTRODUODENOSCOPY (EGD) WITH PROPOFOL;  Surgeon: Manus Gunning, MD;  Location: [facility];  Service: Gastroenterology;  Laterality: N/A;   NASAL SINUS SURGERY  2006   SAVORY DILATION N/A 05/17/2016   Procedure: SAVORY DILATION;  Surgeon: Manus Gunning, MD;  Location: [facility];  Service: Gastroenterology;  Laterality: N/A;   TEE WITHOUT CARDIOVERSION N/A 04/03/2019   Procedure: TRANSESOPHAGEAL ECHOCARDIOGRAM (TEE)-with DCCV;  Surgeon: Dorothy Spark, MD;  Location: [facility] ENDOSCOPY;; EF 55 to 60%.  No LVH.  Normal RV.  Moderate LA dilation.  No thrombus.  Mild RA dilation.  Mild to moderate TR.  Mild aortic valve sclerosis, no stenosis.  Moderately elevated PA pressures (47 mmHg)   TONSILLECTOMY AND ADENOIDECTOMY     TRANSTHORACIC ECHOCARDIOGRAM  2009   Normal LV size and function. EF 60-65%. No regional wall motion and amount is. Mild aortic sclerosis, no stenosis   TUBAL LIGATION  1979   Past Surgical History:  Procedure Laterality Date   64 HOUR [place] STUDY N/A 01/04/2016   Procedure: 24 HOUR PH STUDY;  Surgeon: Manus Gunning, MD;  Location: [facility];  Service: Gastroenterology;  Laterality: N/A;   ABDOMINAL HYSTERECTOMY  1988    ADENOIDECTOMY     ANTERIOR CERVICAL DECOMP/DISCECTOMY FUSION  2009, 1993   first procedure in ~ 1993/Dr [place] N/A 12/16/2021   Procedure: ATRIAL FIBRILLATION ABLATION;  Surgeon: Vickie Epley, MD;  Location: [place] [facility];  Service: Cardiovascular;  Laterality: N/A;   BACK SURGERY     CARDIOVERSION N/A 04/03/2019   Procedure: CARDIOVERSION;  Surgeon: Dorothy Spark, MD;  Location: [facility] ENDOSCOPY;  Service: Cardiovascular;  Laterality: N/A;   CHOLECYSTECTOMY  2005   ESOPHAGEAL MANOMETRY N/A 01/04/2016   Procedure: ESOPHAGEAL MANOMETRY (EM) 24 HR PH;  Surgeon: Manus Gunning, MD;  Location: [facility];  Service: Gastroenterology;  Laterality: N/A;   ESOPHAGOGASTRODUODENOSCOPY (EGD) WITH PROPOFOL N/A 05/17/2016   Procedure: ESOPHAGOGASTRODUODENOSCOPY (EGD) WITH PROPOFOL;  Surgeon: Manus Gunning, MD;  Location: [facility];  Service: Gastroenterology;  Laterality: N/A;   NASAL SINUS SURGERY  2006   SAVORY DILATION N/A 05/17/2016   Procedure: SAVORY DILATION;  Surgeon: Manus Gunning, MD;  Location: [facility];  Service: Gastroenterology;  Laterality: N/A;   TEE WITHOUT CARDIOVERSION N/A 04/03/2019   Procedure: TRANSESOPHAGEAL ECHOCARDIOGRAM (TEE)-with DCCV;  Surgeon: Dorothy Spark, MD;  Location: [facility]OSCOPY;; EF 55 to 60%.  No LVH.  Normal RV.  Moderate LA dilation.  No thrombus.  Mild RA dilation.  Mild to moderate TR.  Mild aortic valve sclerosis, no stenosis.  Moderately elevated PA pressures (47 mmHg)   TONSILLECTOMY AND ADENOIDECTOMY     TRANSTHORACIC ECHOCARDIOGRAM  2009   Normal LV size and function. EF 60-65%. No regional wall motion and amount is. Mild aortic sclerosis, no stenosis   TUBAL LIGATION  1979   Past Medical History:  Diagnosis Date   B12 deficiency    Complication of anesthesia    slow to wake up    Depression    Diabetes mellitus type 2, diet-controlled ([place]) 07/24/2018    Fibromyalgia    Generalized hyperhidrosis 04/17/2014   GERD (gastroesophageal reflux disease)    History of rheumatic fever as a child    Hyperlipemia    Hypertension    denies htn on 05/09/16   HYPOTHYROIDISM 05/29/2007   Irritable bowel syndrome    Migraines    hx of    OSA (obstructive sleep apnea) 06/26/2019   Senile calcific aortic valve sclerosis    Aortic Sclerosis-no stenosis   BP 114/65   Pulse 62   Ht 5' 4.5" (1.638 m)   Wt 186 lb (84.4 kg)   SpO2 97%   BMI 31.43 kg/m   Opioid Risk Score:   Fall Risk Score:  `1  Depression screen PHQ 2/9     04/06/2022    1:08 PM 03/02/2022   10:48 AM 01/21/2022    1:17 PM 01/05/2022   11:34 AM 12/07/2021   11:39 AM 08/23/2021    2:14 PM 07/16/2021   10:25 AM  Depression screen PHQ 2/9  Decreased Interest 0 0 0 0 0 0 0  Down, Depressed, Hopeless  0 0 0 0 0 0  PHQ - 2 Score 0 0 0 0 0 0 0     Review of Systems  Musculoskeletal:  Positive for back pain.       Bilateral knee pain  All other systems reviewed and are negative.     Objective:   Physical Exam Musculoskeletal:     Left knee: No swelling, effusion, erythema, ecchymosis or bony tenderness. Normal range of motion. Tenderness present.     Comments: left knee medial joint line tenderness  Neurological:     Mental Status: She is oriented to person, place, and time.   Amb without assistive device no toe drag or knee instability  Left knee has normal range of motion.      Assessment & Plan:    Left knee OA medial joint line, discussed treatment options in terms of interventional pain procedures, she did get some relief with corticosteroid injection we will trial Zilretta, long-acting triamcinolone to see if we can boost the duration of response.  Will inject under ultrasound guidance. History of chronic mid back pain with thoracic spondylosis as well as cervical postlaminectomy syndrome, continue MS Contin  15 mg 3 times daily

## 2022-04-22 NOTE — Patient Instructions (Signed)
Orthopedic Foot and ankle surgeons in [facility]  Dr Doran Durand- [facility] Dr Sharol GivenConcepcion Ryan

## 2022-05-04 ENCOUNTER — Ambulatory Visit: Payer: Medicare Other | Admitting: Registered Nurse

## 2022-05-05 IMAGING — CR DG CHEST 2V
2 series · 2 of 2 positions shown · non-contrast
Comparison: 10/09/2019

CLINICAL DATA: Shortness of breath, cough for 1 week

EXAM:
CHEST - 2 VIEW

[chest lat]
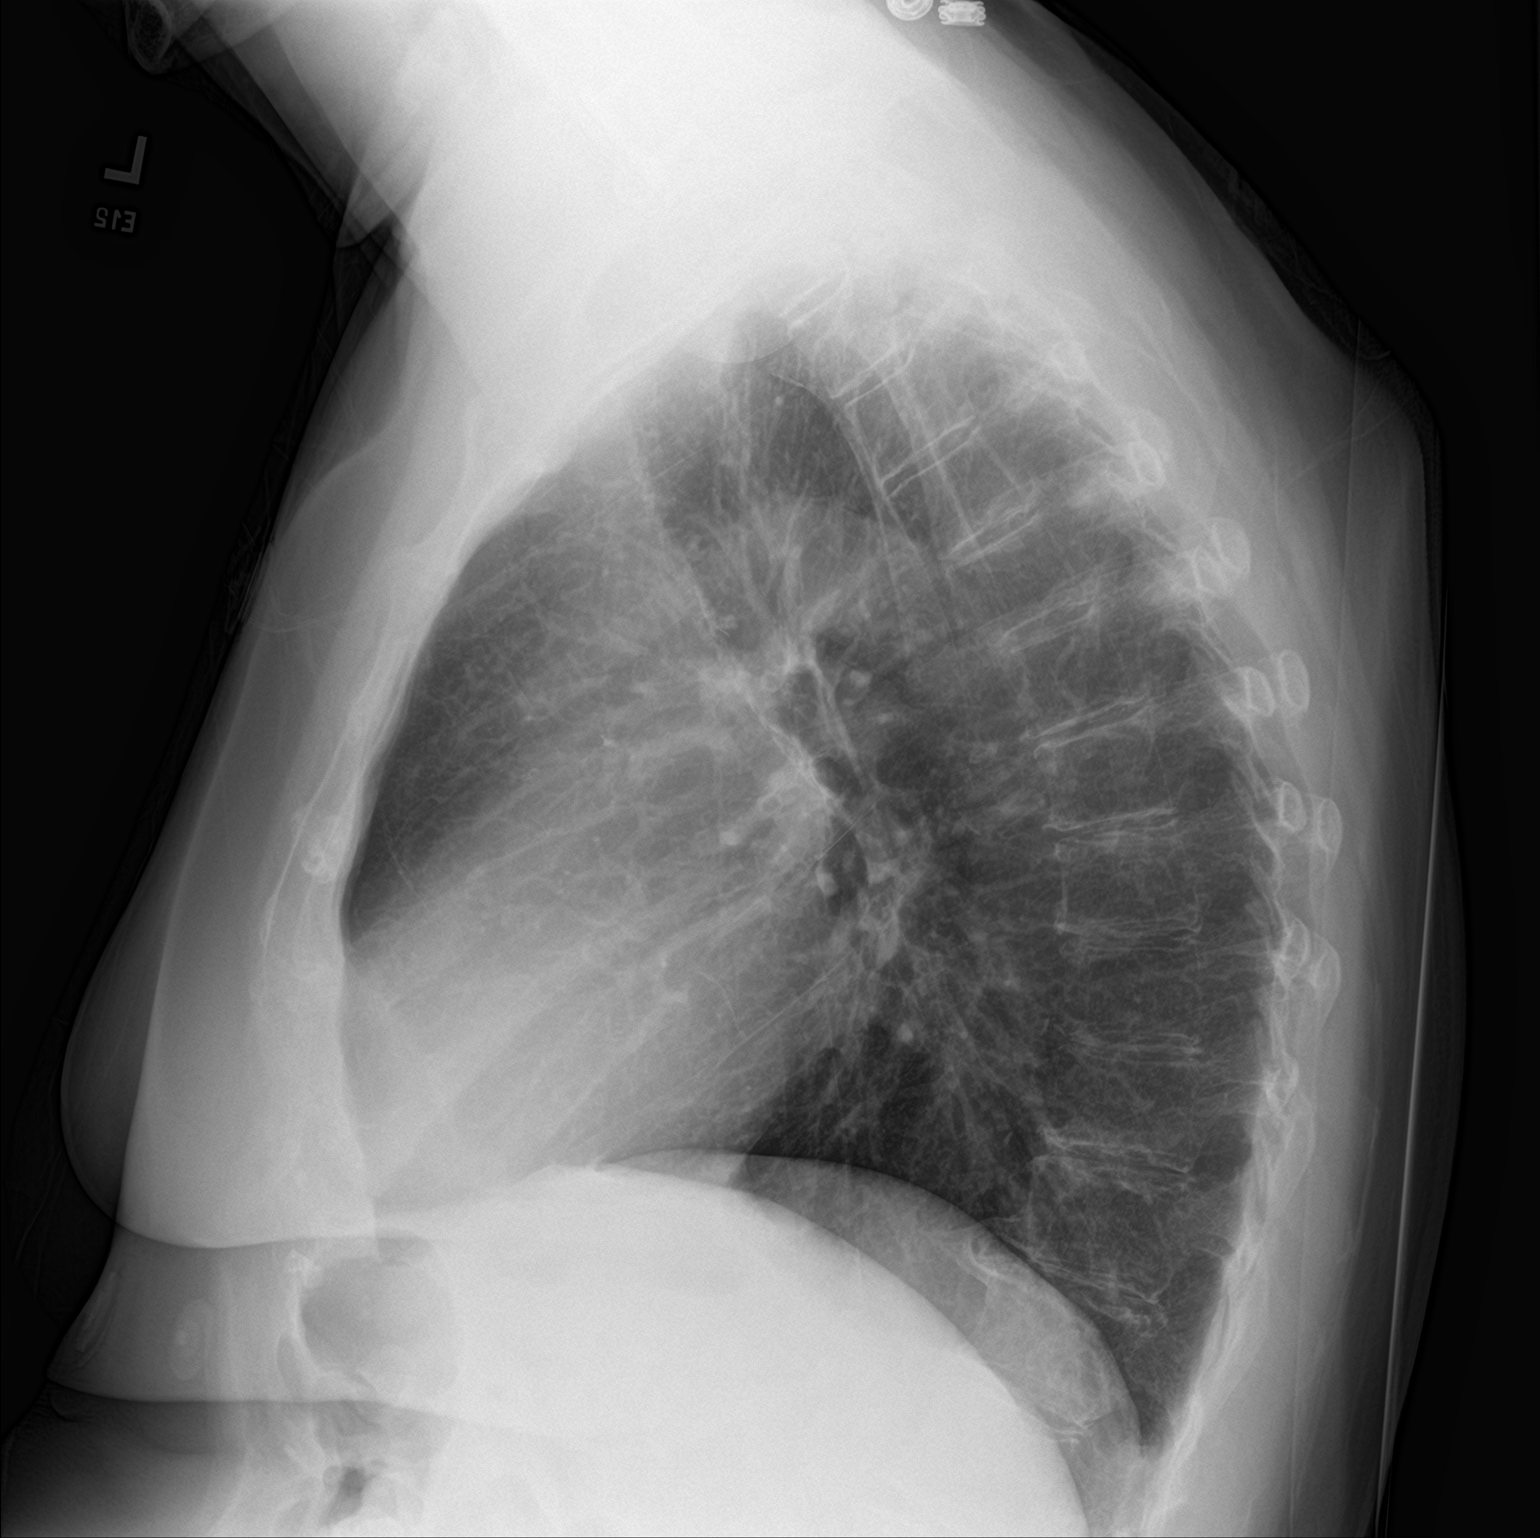

[chest ap]
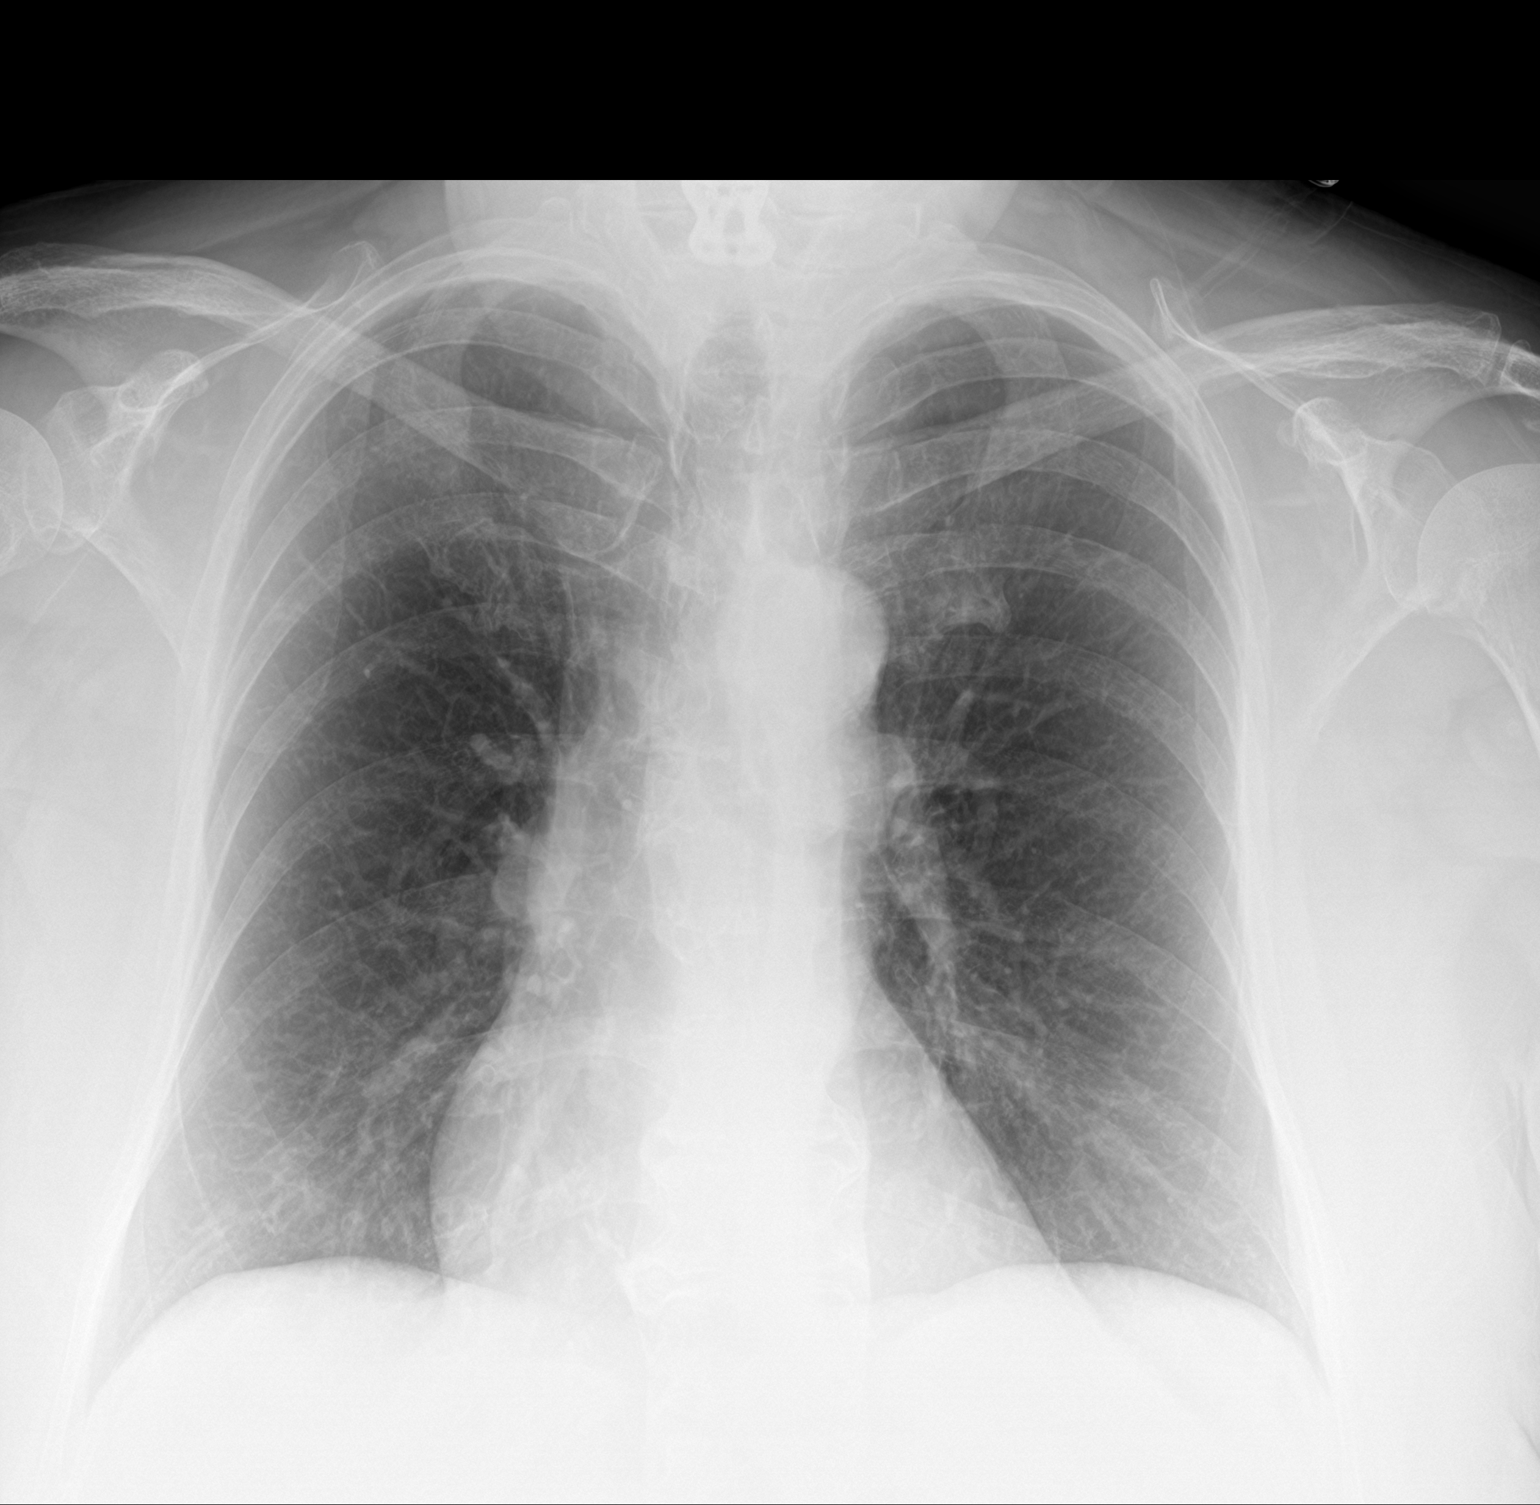

[2 of 2 positions shown; findings below may reference images not displayed]

FINDINGS: The heart size and mediastinal contours are within normal limits.
Both lungs are clear. Disc degenerative disease of thoracic spine.
IMPRESSION: No acute abnormality of the lungs.

## 2022-05-31 ENCOUNTER — Encounter: Payer: Medicare Other | Attending: Physical Medicine & Rehabilitation | Admitting: Physical Medicine & Rehabilitation

## 2022-05-31 ENCOUNTER — Encounter: Payer: Self-pay | Admitting: Physical Medicine & Rehabilitation

## 2022-05-31 VITALS — BP 115/70 | HR 76 | Temp 98.3°F | Ht 64.5 in | Wt 181.2 lb

## 2022-05-31 DIAGNOSIS — M1712 Unilateral primary osteoarthritis, left knee: Secondary | ICD-10-CM | POA: Diagnosis not present

## 2022-05-31 MED ORDER — MORPHINE SULFATE ER 15 MG PO TBCR: 15.0000 mg | EXTENDED_RELEASE_TABLET | Freq: Three times a day (TID) | ORAL | 0 refills | Status: DC

## 2022-05-31 MED ORDER — TRIAMCINOLONE ACETONIDE 32 MG IX SRER
32.0000 mg | Freq: Once | INTRA_ARTICULAR | Status: AC
  Administered 2022-05-31: 32 mg via INTRA_ARTICULAR

## 2022-05-31 NOTE — Patient Instructions (Signed)
Triamcinolone Extended-Release Injection What is this medication? TRIAMCINOLONE (trye am SIN [place] lone) treats arthritis of the knee. It works by decreasing inflammation. It belongs to a group of medications called steroids. This medicine may be used for other purposes; ask your health care provider or pharmacist if you have questions. COMMON BRAND NAME(S): Zilretta What should I tell my care team before I take this medication? They need to know if you have any of these conditions: Cushing syndrome Diabetes Glaucoma Heart disease High blood pressure Infection, such as tuberculosis, herpes, measles, chickenpox, fungal infection Liver disease Low levels of potassium in the blood Mental health condition Myasthenia gravis Recent heart attack Seizures Stomach or intestine disease Thyroid disease An unusual or allergic reaction to triamcinolone, corticosteroids, benzyl alcohol, other medications, foods, dyes, or preservatives Pregnant or trying to get pregnant Breastfeeding How should I use this medication? This medication is injected into a joint. It is given by your care team in a hospital or clinic setting. Talk to your care team about the use of this medication in children. Special care may be needed. Overdosage: If you think you have taken too much of this medicine contact a poison control center or emergency room at once. NOTE: This medicine is only for you. Do not share this medicine with others. What if I miss a dose? This does not apply. What may interact with this medication? Aspirin Certain antivirals for HIV Certain medications for fungal infections, such as ketoconazole, itraconazole Clarithromycin Mifepristone Nefazodone Other steroid medications Vaccines This list may not describe all possible interactions. Give your health care provider a list of all the medicines, herbs, non-prescription drugs, or dietary supplements you use. Also tell them if you smoke, drink  alcohol, or use illegal drugs. Some items may interact with your medicine. What should I watch for while using this medication? Visit your care team for regular checks on your progress. If you are taking this medication for a long time, carry an identification card with your name, the type and dose of medication, and your care team's name and address. Do not come in contact with people who have chickenpox or the measles while you are taking this medication. If you do, call your care team right away. This medication may increase blood sugar. Ask your care team if changes in diet or medications are needed if you have diabetes. What side effects may I notice from receiving this medication? Side effects that you should report to your care team as soon as possible: Allergic reactions--skin rash, itching, hives, swelling of the face, lips, tongue, or throat Cushing syndrome--increased fat around the midsection, upper back, neck, or face, pink or purple stretch marks on the skin, thinning, fragile skin that easily bruises, unexpected hair growth High blood sugar (hyperglycemia)--increased thirst or amount of urine, unusual weakness or fatigue, blurry vision Increase in blood pressure Infection--fever, chills, cough, sore throat, wounds that don't heal, pain or trouble when passing urine, general feeling of discomfort or being unwell Low adrenal gland function--nausea, vomiting, loss of appetite, unusual weakness or fatigue, dizziness Mood and behavior changes--anxiety, nervousness, confusion, hallucinations, irritability, hostility, thoughts of suicide or self-harm, worsening mood, feelings of depression Severe pain, redness, warmth, or swelling in joint where injected Stomach bleeding--bloody or black, tar-like stools, vomiting blood or brown material that looks like coffee grounds Swelling of the ankles, hands, or feet Side effects that usually do not require medical attention (report these to your care  team if they continue or are bothersome): Acne  General discomfort and fatigue Headache Increase in appetite Nausea Trouble sleeping Weight gain This list may not describe all possible side effects. Call your doctor for medical advice about side effects. You may report side effects to FDA at 1-800-FDA-1088. Where should I keep my medication? This medication is given in a hospital or clinic. It will not be stored at home. NOTE: This sheet is a summary. It may not cover all possible information. If you have questions about this medicine, talk to your doctor, pharmacist, or health care provider.  2023 Elsevier/Gold Standard (2021-12-23 00:00:00)

## 2022-05-31 NOTE — Progress Notes (Signed)
Knee injection ultrasound guidance)  Indication:Left Knee pain not relieved by medication management and other conservative care.  Informed consent was obtained after describing risks and benefits of the procedure with the patient, this includes bleeding, bruising, infection and medication side effects. The patient wishes to proceed and has given written consent. The patient was placed in a recumbent position. The medial aspect of the knee was marked and prepped with Betadine and alcohol. It was then entered with a 30-gauge 1/2 inch needle and 2 mL of 1% lidocaine was injected into the skin and subcutaneous tissue. Then another 21g 2" needle was inserted into the knee joint. After negative draw back for blood, 56m of Zilretta was injected . The patient tolerated the procedure well. Post procedure instructions were given.  Refilled Morphine Rx , still has ~ 2wk supply in current pill bottle PDMP reviewed, hydrocodone cough syrup Rx , discussed with pt, , message sent to PCP as well to encourage use of alternative Will not count this as infraction of controlled substance agreement

## 2022-06-07 DIAGNOSIS — L308 Other specified dermatitis: Secondary | ICD-10-CM | POA: Diagnosis not present

## 2022-06-07 DIAGNOSIS — L218 Other seborrheic dermatitis: Secondary | ICD-10-CM | POA: Diagnosis not present

## 2022-06-08 ENCOUNTER — Ambulatory Visit: Payer: Medicare Other | Admitting: Registered Nurse

## 2022-06-09 ENCOUNTER — Ambulatory Visit: Payer: Medicare Other | Admitting: Physical Medicine & Rehabilitation

## 2022-06-13 ENCOUNTER — Telehealth: Payer: Self-pay

## 2022-06-13 ENCOUNTER — Encounter: Payer: Self-pay | Admitting: Family Medicine

## 2022-06-13 NOTE — Telephone Encounter (Signed)
Patient was never called for her [place] today with Denisha.  Can we get her rescheduled.

## 2022-06-13 NOTE — Telephone Encounter (Signed)
LVM for patient to call back and reschedule.

## 2022-06-13 NOTE — Telephone Encounter (Signed)
Fourth attempt to reach patient for AWV today on secondary number per mychart request. No answer. NHA left another voicemail. Okay to reschedule.

## 2022-06-13 NOTE — Telephone Encounter (Signed)
Unable to reach patient for scheduled AWV. No answer. Left message. Okay to reschedule.

## 2022-06-16 ENCOUNTER — Other Ambulatory Visit: Payer: Self-pay | Admitting: Cardiology

## 2022-06-16 ENCOUNTER — Ambulatory Visit (INDEPENDENT_AMBULATORY_CARE_PROVIDER_SITE_OTHER): Payer: Medicare Other

## 2022-06-16 VITALS — Ht 64.5 in | Wt 181.0 lb

## 2022-06-16 DIAGNOSIS — Z0000 Encounter for general adult medical examination without abnormal findings: Secondary | ICD-10-CM | POA: Diagnosis not present

## 2022-06-16 NOTE — Progress Notes (Signed)
Subjective:   [name] is a 71 y.o. female who presents for Medicare Annual (Subsequent) preventive examination.  Review of Systems    No ROS.  Medicare Wellness Virtual Visit.  Visual/audio telehealth visit, UTA vital signs.   See social history for additional risk factors.   Cardiac Risk Factors include: advanced age (>93mn, >>61women);diabetes mellitus;hypertension     Objective:    Today's Vitals   06/16/22 1357  Weight: 181 lb (82.1 kg)  Height: 5' 4.5" (1.638 m)   Body mass index is 30.59 kg/m.     06/16/2022    2:02 PM 12/16/2021    9:25 AM 12/07/2021   11:40 AM 11/17/2021    2:15 PM 09/10/2021    3:10 PM 08/10/2021   10:13 AM 05/31/2021   11:17 AM  Advanced Directives  Does Patient Have a Medical Advance Directive? Yes Yes Yes Yes No Yes Yes  Type of [place]aramed[place]of [place] will [place] will [place]Living will Healthcare Power of APort BarreLiving will HGir[place]Living will  Does patient want to make changes to medical advance directive?  No - Patient declined   No - Patient declined No - Patient declined Yes (MAU/Ambulatory/Procedural Areas - Information given)  Copy of H[place]in Chart? Yes - validated most recent copy scanned in chart (See row information)      Yes - validated most recent copy scanned in chart (See row information)  Would patient like information on creating a medical advance directive?     No - Patient declined      Current Medications (verified) Outpatient Encounter Medications as of 06/16/2022  Medication Sig   acetaminophen (TYLENOL) 650 MG CR tablet Take 650 mg by mouth in the morning and at bedtime.   cetirizine (ZYRTEC) 10 MG tablet Take 10 mg by mouth at bedtime.   Cholecalciferol (VITAMIN D) 125 MCG (5000 UT) CAPS Take 5,000 Units by mouth every morning.    clindamycin (CLEOCIN T) 1 %  lotion Apply 1 application topically daily as needed (eczema).    Coenzyme Q-10 100 MG capsule Take 100 mg by mouth every morning.    diclofenac sodium (VOLTAREN) 1 % GEL Apply 4 g topically 4 (four) times daily as needed (joint pain).    diltiazem (CARDIZEM CD) 240 MG 24 hr capsule TAKE 1 CAPSULE BY MOUTH EVERY DAY   diltiazem (CARDIZEM) 30 MG tablet Take one tablet by mouth every 4 hours as needed for heart rate greater than 100, top number blood pressure needs to be above 100   docusate sodium (COLACE) 250 MG capsule Take 250 mg by mouth at bedtime.   ELIQUIS 5 MG TABS tablet TAKE 1 TABLET BY MOUTH  TWICE DAILY   fluocinonide (LIDEX) 0.05 % external solution Apply 1 application topically daily as needed (eczema on scalp).    gabapentin (NEURONTIN) 300 MG capsule TAKE 1 CAPSULE BY MOUTH 3  TIMES DAILY   hydroxypropyl methylcellulose / hypromellose (ISOPTO TEARS / GONIOVISC) 2.5 % ophthalmic solution Place 1-2 drops into both eyes 3 (three) times daily as needed (dry/irritated eyes.).   levothyroxine (SYNTHROID) 50 MCG tablet TAKE 1 TABLET BY MOUTH  DAILY   loratadine (CLARITIN) 10 MG tablet Take 10 mg by mouth every morning.    Magnesium Bisglycinate (MAG GLYCINATE PO) Take 400 mg by mouth at bedtime.   methocarbamol (ROBAXIN) 500 MG tablet TAKE 1 TABLET BY MOUTH  EVERY 8 HOURS AS NEEDED FOR MUSCLE SPASM   morphine (MS CONTIN) 15 MG 12 hr tablet Take 1 tablet (15 mg total) by mouth 3 (three) times daily.   oxybutynin (DITROPAN-XL) 10 MG 24 hr tablet TAKE 1 TABLET BY MOUTH AT  BEDTIME   oxymetazoline (AFRIN) 0.05 % nasal spray Place 1 spray into both nostrils 2 (two) times daily as needed for congestion.   RABEprazole (ACIPHEX) 20 MG tablet Take 1 tablet (20 mg total) by mouth every evening.   Resveratrol 100 MG CAPS Take 100 mg by mouth in the morning and at bedtime.   tretinoin (RETIN-A) 0.05 % cream Apply 1 application  topically daily as needed (acne).   Wheat Dextrin (BENEFIBER) CHEW Chew 2  tablets by mouth 2 (two) times daily as needed (regularity).   No facility-administered encounter medications on file as of 06/16/2022.    Allergies (verified) Cyclobenzaprine hcl, Rizatriptan benzoate, Sumatriptan, Tizanidine, Aleve [naproxen sodium], Aspartame, Clarithromycin, Corn-containing products, Doxycycline, Duloxetine, Egg [eggs or egg-derived products], Keflex [cephalexin], Metronidazole, Monosodium glutamate, Oxycodone hcl, Oxycontin [oxycodone hcl], Penicillins, Rosuvastatin, Venlafaxine, Voltaren [diclofenac sodium], Septra [sulfamethoxazole-trimethoprim], and Tramadol   History: Past Medical History:  Diagnosis Date   B12 deficiency    Complication of anesthesia    slow to wake up    Depression    Diabetes mellitus type 2, diet-controlled ([place]) 07/24/2018   Fibromyalgia    Generalized hyperhidrosis 04/17/2014   GERD (gastroesophageal reflux disease)    History of rheumatic fever as a child    Hyperlipemia    Hypertension    denies htn on 05/09/16   HYPOTHYROIDISM 05/29/2007   Irritable bowel syndrome    Migraines    hx of    OSA (obstructive sleep apnea) 06/26/2019   Senile calcific aortic valve sclerosis    Aortic Sclerosis-no stenosis   Past Surgical History:  Procedure Laterality Date   9 HOUR [place] STUDY N/A 01/04/2016   Procedure: 24 HOUR [place] STUDY;  Surgeon: Manus Gunning, MD;  Location: [facility];  Service: Gastroenterology;  Laterality: N/A;   ABDOMINAL HYSTERECTOMY  1988   ADENOIDECTOMY     ANTERIOR CERVICAL DECOMP/DISCECTOMY FUSION  2009, 1993   first procedure in ~ 1993/Dr Franklin N/A 12/16/2021   Procedure: ATRIAL FIBRILLATION ABLATION;  Surgeon: Vickie Epley, MD;  Location: [place] [facility] LAB;  Service: Cardiovascular;  Laterality: N/A;   BACK SURGERY     CARDIOVERSION N/A 04/03/2019   Procedure: CARDIOVERSION;  Surgeon: Dorothy Spark, MD;  Location: [facility] ENDOSCOPY;  Service:  Cardiovascular;  Laterality: N/A;   CHOLECYSTECTOMY  2005   ESOPHAGEAL MANOMETRY N/A 01/04/2016   Procedure: ESOPHAGEAL MANOMETRY (EM) 24 HR PH;  Surgeon: Manus Gunning, MD;  Location: [facility] ENDOSCOPY;  Service: Gastroenterology;  Laterality: N/A;   ESOPHAGOGASTRODUODENOSCOPY (EGD) WITH PROPOFOL N/A 05/17/2016   Procedure: ESOPHAGOGASTRODUODENOSCOPY (EGD) WITH PROPOFOL;  Surgeon: Manus Gunning, MD;  Location: [facility] ENDOSCOPY;  Service: Gastroenterology;  Laterality: N/A;   NASAL SINUS SURGERY  2006   SAVORY DILATION N/A 05/17/2016   Procedure: SAVORY DILATION;  Surgeon: Manus Gunning, MD;  Location: [facility] ENDOSCOPY;  Service: Gastroenterology;  Laterality: N/A;   TEE WITHOUT CARDIOVERSION N/A 04/03/2019   Procedure: TRANSESOPHAGEAL ECHOCARDIOGRAM (TEE)-with DCCV;  Surgeon: Dorothy Spark, MD;  Location: [facility] ENDOSCOPY;; EF 55 to 60%.  No LVH.  Normal RV.  Moderate LA dilation.  No thrombus.  Mild RA dilation.  Mild to moderate TR.  Mild aortic valve sclerosis, no stenosis.  Moderately elevated PA pressures (47 mmHg)   TONSILLECTOMY AND ADENOIDECTOMY     TRANSTHORACIC ECHOCARDIOGRAM  2009   Normal LV size and function. EF 60-65%. No regional wall motion and amount is. Mild aortic sclerosis, no stenosis   TUBAL LIGATION  1979   Family History  Problem Relation Age of Onset   Breast cancer Mother    Diabetes Mother    Heart disease Mother    Allergies Mother    Diabetes Father    Heart disease Father    Emphysema Father    Allergies Father    Breast cancer Paternal Aunt    Colon cancer Neg Hx    Esophageal cancer Neg Hx    Rectal cancer Neg Hx    Stomach cancer Neg Hx    Social History   Socioeconomic History   Marital status: Widowed    Spouse name: Not on file   Number of children: Not on file   Years of education: Not on file   Highest education level: Not on file  Occupational History   Occupation: Radiation protection practitioner   Occupation: retired  Tobacco Use   Smoking  status: Never   Smokeless tobacco: Never   Tobacco comments:    Never smoke 01/13/22  Vaping Use   Vaping Use: Never used  Substance and Sexual Activity   Alcohol use: Yes    Alcohol/week: 1.0 standard drink of alcohol    Types: 1 Glasses of wine per week    Comment: rare   Drug use: Never   Sexual activity: Not Currently  Other Topics Concern   Not on file  Social History Narrative   Not on file   Social Determinants of Health   Financial Resource Strain: Low Risk  (06/16/2022)   Overall Financial Resource Strain (CARDIA)    Difficulty of Paying Living Expenses: Not hard at all  Food Insecurity: No Food Insecurity (06/16/2022)   Hunger Vital Sign    Worried About Running Out of Food in the Last Year: Never true    Ran Out of Food in the Last Year: Never true  Transportation Needs: No Transportation Needs (06/16/2022)   PRAPARE - Hydrologist (Medical): No    Lack of Transportation (Non-Medical): No  Physical Activity: Sufficiently Active (05/31/2021)   Exercise Vital Sign    Days of Exercise per Week: 7 days    Minutes of Exercise per Session: 30 min  Stress: No Stress Concern Present (06/16/2022)   [place]    Feeling of Stress : Not at all  Social Connections: Moderately Integrated (06/16/2022)   Social Connection and Isolation Panel [NHANES]    Frequency of Communication with Friends and Family: Three times a week    Frequency of Social Gatherings with Friends and Family: More than three times a week    Attends Religious Services: More than 4 times per year    Active Member of Clubs or Organizations: Yes    Attends Archivist Meetings: More than 4 times per year    Marital Status: Widowed    Tobacco Counseling Counseling given: Not Answered Tobacco comments: Never smoke 01/13/22   Clinical Intake:  Pre-visit preparation completed: Yes         Diabetes: Yes (Followed by pcp)  Nutrition Risk Assessment: Has the patient had any N/V/D within the last 2 months?  No  Does the patient have any  non-healing wounds?  No  Has the patient had any unintentional weight loss or weight gain?  No   Diabetes: Is the patient diabetic?  Yes  If diabetic, was a CBG obtained today?  No  Did the patient bring in their glucometer from home?  No  How often do you monitor your CBG's? Does not monitor at home.   Financial Strains and Diabetes Management: Are you having any financial strains with the device, your supplies or your medication? No .  Does the patient want to be seen by Chronic Care Management for management of their diabetes?  No  Would the patient like to be referred to a Nutritionist or for Diabetic Management?  No   Diabetic Exams:  Diabetic Eye Exam: Completed 04/04/22 Diabetic Foot Exam- due. Followed by PCP. Deferred.   A1C-due. Deferred.  Patient agrees to call and schedule routine appointment with pcp.   How often do you need to have someone help you when you read instructions, pamphlets, or other written materials from your doctor or pharmacy?: 1 - Never   Interpreter Needed?: No      Activities of Daily Living    06/16/2022    2:03 PM  In your present state of health, do you have any difficulty performing the following activities:  Hearing? 0  Vision? 0  Difficulty concentrating or making decisions? 0  Walking or climbing stairs? 0  Dressing or bathing? 0  Doing errands, shopping? 0  Preparing Food and eating ? N  Using the Toilet? N  In the past six months, have you accidently leaked urine? N  Do you have problems with loss of bowel control? N  Managing your Medications? N  Managing your Finances? N  Housekeeping or managing your Housekeeping? N    Patient Care Team: Owens Loffler, MD as PCP - General [name], MD as PCP - Cardiology (Cardiology) Letta Pate Luanna Salk, MD as Consulting  Physician (Physical Medicine and Rehabilitation) Armbruster, Carlota Raspberry, MD as Consulting Physician (Gastroenterology) [name], MD as Consulting Physician (Cardiology) Chesley Mires, MD as Consulting Physician (Pulmonary Disease) Charlton Haws, [facility] as Pharmacist (Pharmacist)  Indicate any recent Medical Services you may have received from other than [facility] providers in the past year (date may be approximate).     Assessment:   This is a routine wellness examination for [place].  I connected with  [name] on 06/16/22 by a audio enabled telemedicine application and verified that I am speaking with the correct person using two identifiers.  Patient Location: Home  Provider Location: Office/Clinic  I discussed the limitations of evaluation and management by telemedicine. The patient expressed understanding and agreed to proceed.   Hearing/Vision screen Hearing Screening - Comments:: Patient is able to hear conversational tones without difficulty.  No issues reported.   Vision Screening - Comments:: Last eye exam 04/04/22. No retinopathy reported.   Dietary issues and exercise activities discussed: Current Exercise Habits: Home exercise routine, Intensity: Mild   Goals Addressed             This Visit's Progress    Maintain Healthy Lifestyle       Stay active Healthy diet Stay hydrated       Depression Screen    06/16/2022    2:01 PM 05/31/2022   11:12 AM 04/06/2022    1:08 PM 03/02/2022   10:48 AM 01/21/2022    1:17 PM 01/05/2022   11:34 AM 12/07/2021   11:39 AM  PHQ  2/9 Scores  PHQ - 2 Score 0 0 0 0 0 0 0    Fall Risk    06/16/2022    2:03 PM 05/31/2022   11:12 AM 04/06/2022    1:08 PM 03/02/2022   10:48 AM 01/21/2022    1:17 PM  Fall Risk   Falls in the past year? 0 0 0 0 0  Number falls in past yr: 0  0 0 0  Injury with Fall? 0  0 0   Risk for fall due to : No Fall Risks      Follow up Falls evaluation completed;Falls prevention discussed         FALL RISK PREVENTION PERTAINING TO THE HOME: Home free of loose throw rugs in walkways, pet beds, electrical cords, etc? Yes  Adequate lighting in your home to reduce risk of falls? Yes   ASSISTIVE DEVICES UTILIZED TO PREVENT FALLS: Life alert? No  Use of a cane, walker or w/c? No   TIMED UP AND GO: Was the test performed? No .   Cognitive Function:    02/12/2020    1:23 PM 01/18/2019   11:29 AM 01/10/2018   12:08 PM 01/02/2017    1:23 PM  MMSE - Mini Mental State Exam  Orientation to time _0 Orientation to Place _1 Registration _2 Attention/ Calculation 5 5 0 0  Recall _3 Language- name 2 objects  0 0 0  Language- repeat _4 Language- follow 3 step command  0 3 3  Language- read & follow direction  0 0 0  Write a sentence  0 0 0  Copy design  0 0 0  Total score  _5 06/16/2022    2:04 PM  6CIT Screen  What Year? 0 points  What month? 0 points  What time? 0 points    Immunizations Immunization History  Administered Date(s) Administered   PFIZER(Purple Top)SARS-COV-2 Vaccination 08/22/2019, 09/18/2019   Pneumococcal Conjugate-13 01/02/2017   Pneumococcal Polysaccharide-23 05/06/2013, 07/25/2018   Td 06/28/2003, 10/16/2013   Zoster, Live 11/07/2012   Flu Vaccine status: Declined, Education has been provided regarding the importance of this vaccine but patient still declined. Advised may receive this vaccine at local pharmacy or Health Dept. Aware to provide a copy of the vaccination record if obtained from local pharmacy or Health Dept. Verbalized acceptance and understanding.  Covid-19 vaccine status: Completed vaccines x2.  Shingrix Completed?: No.    Education has been provided regarding the importance of this vaccine. Patient has been advised to call insurance company to determine out of pocket expense if they have not yet received this vaccine. Advised may also receive vaccine at local pharmacy or Health Dept.  Verbalized acceptance and understanding.  Screening Tests Health Maintenance  Topic Date Due   COVID-19 Vaccine (3 - Pfizer risk series) 07/02/2022 (Originally 10/16/2019)   Fecal DNA (Cologuard)  07/28/2022 (Originally 08/05/2021)   FOOT EXAM  08/26/2022 (Originally 11/30/2018)   HEMOGLOBIN A1C  08/26/2022 (Originally 02/20/2022)   Zoster Vaccines- Shingrix (1 of 2) 09/15/2022 (Originally 11/02/1969)   INFLUENZA VACCINE  09/25/2022 (Originally 01/25/2022)   Diabetic kidney evaluation - Urine ACR  08/23/2022   Diabetic kidney evaluation - eGFR measurement  11/26/2022   OPHTHALMOLOGY EXAM  04/05/2023   Medicare Annual Wellness (AWV)  06/17/2023   DTaP/Tdap/Td (3 - Tdap) 10/17/2023  MAMMOGRAM  01/28/2024   Pneumonia Vaccine 39+ Years old  Completed   DEXA SCAN  Completed   Hepatitis C Screening  Completed   HPV VACCINES  Aged Out   Health Maintenance There are no preventive care reminders to display for this patient.  Cologuard- deferred per patient.   Lung Cancer Screening: (Low Dose CT Chest recommended if Age 47-80 years, 30 pack-year currently smoking OR have quit w/in 15years.) does not qualify.   Vision Screening: Recommended annual ophthalmology exams for early detection of glaucoma and other disorders of the eye.  Dental Screening: Recommended annual dental exams for proper oral hygiene.  Community Resource Referral / Chronic Care Management: CRR required this visit?  No   CCM required this visit?  No      Plan:     I have personally reviewed and noted the following in the patient's chart:   Medical and social history Use of alcohol, tobacco or illicit drugs  Current medications and supplements including opioid prescriptions. Patient is not currently taking opioid prescriptions. Functional ability and status Nutritional status Physical activity Advanced directives List of other physicians Hospitalizations, surgeries, and ER visits in previous 12  months Vitals Screenings to include cognitive, depression, and falls Referrals and appointments  In addition, I have reviewed and discussed with patient certain preventive protocols, quality metrics, and best practice recommendations. A written personalized care plan for preventive services as well as general preventive health recommendations were provided to patient.     Leta Jungling, LPN   [id number]

## 2022-06-16 NOTE — Patient Instructions (Addendum)
[name] , Thank you for taking time to come for your Medicare Wellness Visit. I appreciate your ongoing commitment to your health goals. Please review the following plan we discussed and let me know if I can assist you in the future.   These are the goals we discussed:  Goals Addressed             This Visit's Progress    Maintain Healthy Lifestyle       Stay active Healthy diet Stay hydrated         This is a list of the screening recommended for you and due dates:  Health Maintenance  Topic Date Due   COVID-19 Vaccine (3 - Pfizer risk series) 07/02/2022*   Cologuard (Stool DNA test)  07/28/2022*   Complete foot exam   08/26/2022*   Hemoglobin A1C  08/26/2022*   Zoster (Shingles) Vaccine (1 of 2) 09/15/2022*   Flu Shot  09/25/2022*   Yearly kidney health urinalysis for diabetes  08/23/2022   Yearly kidney function blood test for diabetes  11/26/2022   Eye exam for diabetics  04/05/2023   Medicare Annual Wellness Visit  06/17/2023   DTaP/Tdap/Td vaccine (3 - Tdap) 10/17/2023   Mammogram  01/28/2024   Pneumonia Vaccine  Completed   DEXA scan (bone density measurement)  Completed   Hepatitis C Screening: USPSTF Recommendation to screen - Ages 80-79 yo.  Completed   HPV Vaccine  Aged Out  *Topic was postponed. The date shown is not the original due date.    Advanced directives: on file  Conditions/risks identified: none new  Next appointment: Follow up in one year for your annual wellness visit    Preventive Care 65 Years and Older, Female Preventive care refers to lifestyle choices and visits with your health care provider that can promote health and wellness. What does preventive care include? A yearly physical exam. This is also called an annual well check. Dental exams once or twice a year. Routine eye exams. Ask your health care provider how often you should have your eyes checked. Personal lifestyle choices, including: Daily care of your teeth and  gums. Regular physical activity. Eating a healthy diet. Avoiding tobacco and drug use. Limiting alcohol use. Practicing safe sex. Taking low-dose aspirin every day. Taking vitamin and mineral supplements as recommended by your health care provider. What happens during an annual well check? The services and screenings done by your health care provider during your annual well check will depend on your age, overall health, lifestyle risk factors, and family history of disease. Counseling  Your health care provider may ask you questions about your: Alcohol use. Tobacco use. Drug use. Emotional well-being. Home and relationship well-being. Sexual activity. Eating habits. History of falls. Memory and ability to understand (cognition). Work and work Statistician. Reproductive health. Screening  You may have the following tests or measurements: Height, weight, and BMI. Blood pressure. Lipid and cholesterol levels. These may be checked every 5 years, or more frequently if you are over 87 years old. Skin check. Lung cancer screening. You may have this screening every year starting at age 18 if you have a 30-pack-year history of smoking and currently smoke or have quit within the past 15 years. Fecal occult blood test (FOBT) of the stool. You may have this test every year starting at age 67. Flexible sigmoidoscopy or colonoscopy. You may have a sigmoidoscopy every 5 years or a colonoscopy every 10 years starting at age 30. Hepatitis C blood test.  Hepatitis B blood test. Sexually transmitted disease (STD) testing. Diabetes screening. This is done by checking your blood sugar (glucose) after you have not eaten for a while (fasting). You may have this done every 1-3 years. Bone density scan. This is done to screen for osteoporosis. You may have this done starting at age 38. Mammogram. This may be done every 1-2 years. Talk to your health care provider about how often you should have regular  mammograms. Talk with your health care provider about your test results, treatment options, and if necessary, the need for more tests. Vaccines  Your health care provider may recommend certain vaccines, such as: Influenza vaccine. This is recommended every year. Tetanus, diphtheria, and acellular pertussis (Tdap, Td) vaccine. You may need a Td booster every 10 years. Zoster vaccine. You may need this after age 87. Pneumococcal 13-valent conjugate (PCV13) vaccine. One dose is recommended after age 36. Pneumococcal polysaccharide (PPSV23) vaccine. One dose is recommended after age 63. Talk to your health care provider about which screenings and vaccines you need and how often you need them. This information is not intended to replace advice given to you by your health care provider. Make sure you discuss any questions you have with your health care provider. Document Released: 07/10/2015 Document Revised: 03/02/2016 Document Reviewed: 04/14/2015 Elsevier Interactive Patient Education  2017 [employer] Prevention in the Home Falls can cause injuries. They can happen to people of all ages. There are many things you can do to make your home safe and to help prevent falls. What can I do on the outside of my home? Regularly fix the edges of walkways and driveways and fix any cracks. Remove anything that might make you trip as you walk through a door, such as a raised step or threshold. Trim any bushes or trees on the path to your home. Use bright outdoor lighting. Clear any walking paths of anything that might make someone trip, such as rocks or tools. Regularly check to see if handrails are loose or broken. Make sure that both sides of any steps have handrails. Any raised decks and porches should have guardrails on the edges. Have any leaves, snow, or ice cleared regularly. Use sand or salt on walking paths during winter. Clean up any spills in your garage right away. This includes oil  or grease spills. What can I do in the bathroom? Use night lights. Install grab bars by the toilet and in the tub and shower. Do not use towel bars as grab bars. Use non-skid mats or decals in the tub or shower. If you need to sit down in the shower, use a plastic, non-slip stool. Keep the floor dry. Clean up any water that spills on the floor as soon as it happens. Remove soap buildup in the tub or shower regularly. Attach bath mats securely with double-sided non-slip rug tape. Do not have throw rugs and other things on the floor that can make you trip. What can I do in the bedroom? Use night lights. Make sure that you have a light by your bed that is easy to reach. Do not use any sheets or blankets that are too big for your bed. They should not hang down onto the floor. Have a firm chair that has side arms. You can use this for support while you get dressed. Do not have throw rugs and other things on the floor that can make you trip. What can I do in the kitchen? Clean up  any spills right away. Avoid walking on wet floors. Keep items that you use a lot in easy-to-reach places. If you need to reach something above you, use a strong step stool that has a grab bar. Keep electrical cords out of the way. Do not use floor polish or wax that makes floors slippery. If you must use wax, use non-skid floor wax. Do not have throw rugs and other things on the floor that can make you trip. What can I do with my stairs? Do not leave any items on the stairs. Make sure that there are handrails on both sides of the stairs and use them. Fix handrails that are broken or loose. Make sure that handrails are as long as the stairways. Check any carpeting to make sure that it is firmly attached to the stairs. Fix any carpet that is loose or worn. Avoid having throw rugs at the top or bottom of the stairs. If you do have throw rugs, attach them to the floor with carpet tape. Make sure that you have a light  switch at the top of the stairs and the bottom of the stairs. If you do not have them, ask someone to add them for you. What else can I do to help prevent falls? Wear shoes that: Do not have high heels. Have rubber bottoms. Are comfortable and fit you well. Are closed at the toe. Do not wear sandals. If you use a stepladder: Make sure that it is fully opened. Do not climb a closed stepladder. Make sure that both sides of the stepladder are locked into place. Ask someone to hold it for you, if possible. Clearly mark and make sure that you can see: Any grab bars or handrails. First and last steps. Where the edge of each step is. Use tools that help you move around (mobility aids) if they are needed. These include: Canes. Walkers. Scooters. Crutches. Turn on the lights when you go into a dark area. Replace any light bulbs as soon as they burn out. Set up your furniture so you have a clear path. Avoid moving your furniture around. If any of your floors are uneven, fix them. If there are any pets around you, be aware of where they are. Review your medicines with your doctor. Some medicines can make you feel dizzy. This can increase your chance of falling. Ask your doctor what other things that you can do to help prevent falls. This information is not intended to replace advice given to you by your health care provider. Make sure you discuss any questions you have with your health care provider. Document Released: 04/09/2009 Document Revised: 11/19/2015 Document Reviewed: 07/18/2014 Elsevier Interactive Patient Education  2017 [employer].

## 2022-07-06 ENCOUNTER — Encounter: Payer: Self-pay | Admitting: Registered Nurse

## 2022-07-06 ENCOUNTER — Encounter: Payer: Medicare Other | Attending: Physical Medicine & Rehabilitation | Admitting: Registered Nurse

## 2022-07-06 VITALS — BP 124/69 | HR 60 | Ht 64.5 in | Wt 181.0 lb

## 2022-07-06 DIAGNOSIS — G8929 Other chronic pain: Secondary | ICD-10-CM

## 2022-07-06 DIAGNOSIS — G894 Chronic pain syndrome: Secondary | ICD-10-CM | POA: Diagnosis present

## 2022-07-06 DIAGNOSIS — M546 Pain in thoracic spine: Secondary | ICD-10-CM | POA: Diagnosis present

## 2022-07-06 DIAGNOSIS — Z5181 Encounter for therapeutic drug level monitoring: Secondary | ICD-10-CM | POA: Diagnosis present

## 2022-07-06 DIAGNOSIS — M1712 Unilateral primary osteoarthritis, left knee: Secondary | ICD-10-CM

## 2022-07-06 DIAGNOSIS — Z79891 Long term (current) use of opiate analgesic: Secondary | ICD-10-CM | POA: Insufficient documentation

## 2022-07-06 MED ORDER — MORPHINE SULFATE ER 15 MG PO TBCR: 15.0000 mg | EXTENDED_RELEASE_TABLET | Freq: Three times a day (TID) | ORAL | 0 refills | Status: DC

## 2022-07-06 NOTE — Progress Notes (Signed)
Subjective:    Patient ID: [name], female    DOB: [birth year], 72 y.o.   MRN: [id number]  HPI: [name] is a 72 y.o. female who returns for follow up appointment for chronic pain and medication refill. She states her pain is located in her mid-back and left knee pain. She rates her pain 4. Her current exercise regime is walking and performing stretching exercises.  [name] Morphine equivalent is 45.00 MME.   Last UDS was Performed on 03/02/2022, it was consistent.      Pain Inventory Average Pain 4 Pain Right Now 4 My pain is constant, burning, and aching  In the last 24 hours, has pain interfered with the following? General activity 4 Relation with others 4 Enjoyment of life 4 What TIME of day is your pain at its worst? evening Sleep (in general) Good  Pain is worse with: walking, bending, standing, and some activites Pain improves with: rest, heat/ice, and medication Relief from Meds: 5     Family History  Problem Relation Age of Onset   Breast cancer Mother    Diabetes Mother    Heart disease Mother    Allergies Mother    Diabetes Father    Heart disease Father    Emphysema Father    Allergies Father    Breast cancer Paternal Aunt    Colon cancer Neg Hx    Esophageal cancer Neg Hx    Rectal cancer Neg Hx    Stomach cancer Neg Hx    Social History   Socioeconomic History   Marital status: Widowed    Spouse name: Not on file   Number of children: Not on file   Years of education: Not on file   Highest education level: Not on file  Occupational History   Occupation: Radiation protection practitioner   Occupation: retired  Tobacco Use   Smoking status: Never   Smokeless tobacco: Never   Tobacco comments:    Never smoke 01/13/22  Vaping Use   Vaping Use: Never used  Substance and Sexual Activity   Alcohol use: Yes    Alcohol/week: 1.0 standard drink of alcohol    Types: 1 Glasses of wine per week    Comment: rare   Drug use: Never   Sexual activity: Not  Currently  Other Topics Concern   Not on file  Social History Narrative   Not on file   Social Determinants of Health   Financial Resource Strain: Low Risk  (06/16/2022)   Overall Financial Resource Strain (CARDIA)    Difficulty of Paying Living Expenses: Not hard at all  Food Insecurity: No Food Insecurity (06/16/2022)   Hunger Vital Sign    Worried About Running Out of Food in the Last Year: Never true    Ran Out of Food in the Last Year: Never true  Transportation Needs: No Transportation Needs (06/16/2022)   PRAPARE - Hydrologist (Medical): No    Lack of Transportation (Non-Medical): No  Physical Activity: Sufficiently Active (05/31/2021)   Exercise Vital Sign    Days of Exercise per Week: 7 days    Minutes of Exercise per Session: 30 min  Stress: No Stress Concern Present (06/16/2022)   [place]    Feeling of Stress : Not at all  Social Connections: Moderately Integrated (06/16/2022)   Social Connection and Isolation Panel [NHANES]    Frequency of Communication with Friends and Family: Three  times a week    Frequency of Social Gatherings with Friends and Family: More than three times a week    Attends Religious Services: More than 4 times per year    Active Member of Clubs or Organizations: Yes    Attends Archivist Meetings: More than 4 times per year    Marital Status: Widowed   Past Surgical History:  Procedure Laterality Date   3 HOUR [facility] STUDY N/A 01/04/2016   Procedure: 24 HOUR PH STUDY;  Surgeon: Manus Gunning, MD;  Location: [facility];  Service: Gastroenterology;  Laterality: N/A;   ABDOMINAL HYSTERECTOMY  1988   ADENOIDECTOMY     ANTERIOR CERVICAL DECOMP/DISCECTOMY FUSION  2009, 1993   first procedure in ~ 1993/Dr Garber N/A 12/16/2021   Procedure: ATRIAL FIBRILLATION ABLATION;  Surgeon:  Vickie Epley, MD;  Location: [facility];  Service: Cardiovascular;  Laterality: N/A;   BACK SURGERY     CARDIOVERSION N/A 04/03/2019   Procedure: CARDIOVERSION;  Surgeon: Dorothy Spark, MD;  Location: [facility] END[facility]COPY;  Service: Cardiovascular;  Laterality: N/A;   CHOLECYSTECTOMY  2005   ESOPHAGEAL MANOMETRY N/A 01/04/2016   Procedure: ESOPHAGEAL MANOMETRY (EM) 24 HR PH;  Surgeon: Manus Gunning, MD;  Location: [facility];  Service: Gastroenterology;  Laterality: N/A;   ESOPHAGOGASTRODUODENOSCOPY (EGD) WITH PROPOFOL N/A 05/17/2016   Procedure: ESOPHAGOGASTRODUODENOSCOPY (EGD) WITH PROPOFOL;  Surgeon: Manus Gunning, MD;  Location: [facility];  Service: Gastroenterology;  Laterality: N/A;   NASAL SINUS SURGERY  2006   SAVORY DILATION N/A 05/17/2016   Procedure: SAVORY DILATION;  Surgeon: Manus Gunning, MD;  Location: [facility];  Service: Gastroenterology;  Laterality: N/A;   TEE WITHOUT CARDIOVERSION N/A 04/03/2019   Procedure: TRANSESOPHAGEAL ECHOCARDIOGRAM (TEE)-with DCCV;  Surgeon: Dorothy Spark, MD;  Location: [facility]COPY;; EF 55 to 60%.  No LVH.  Normal RV.  Moderate LA dilation.  No thrombus.  Mild RA dilation.  Mild to moderate TR.  Mild aortic valve sclerosis, no stenosis.  Moderately elevated PA pressures (47 mmHg)   TONSILLECTOMY AND ADENOIDECTOMY     TRANSTHORACIC ECHOCARDIOGRAM  2009   Normal LV size and function. EF 60-65%. No regional wall motion and amount is. Mild aortic sclerosis, no stenosis   TUBAL LIGATION  1979   Past Medical History:  Diagnosis Date   B12 deficiency    Complication of anesthesia    slow to wake up    Depression    Diabetes mellitus type 2, diet-controlled ([place]) 07/24/2018   Fibromyalgia    Generalized hyperhidrosis 04/17/2014   GERD (gastroesophageal reflux disease)    History of rheumatic fever as a child    Hyperlipemia    Hypertension    denies htn on 05/09/16   HYPOTHYROIDISM 05/29/2007    Irritable bowel syndrome    Migraines    hx of    OSA (obstructive sleep apnea) 06/26/2019   Senile calcific aortic valve sclerosis    Aortic Sclerosis-no stenosis   There were no vitals taken for this visit.  Opioid Risk Score:   Fall Risk Score:  `1  Depression screen PHQ 2/9     06/16/2022    2:01 PM 05/31/2022   11:12 AM 04/06/2022    1:08 PM 03/02/2022   10:48 AM 01/21/2022    1:17 PM 01/05/2022   11:34 AM 12/07/2021   11:39 AM  Depression screen PHQ 2/9  Decreased Interest 0 0  0 0 0 0 0  Down, Depressed, Hopeless 0 0  0 0 0 0  PHQ - 2 Score 0 0 0 0 0 0 0    Review of Systems  Musculoskeletal:  Positive for back pain and neck pain.       Left knee pain, left foot pain  All other systems reviewed and are negative.      Objective:   Physical Exam Vitals and nursing note reviewed.  Constitutional:      Appearance: Normal appearance.  Cardiovascular:     Rate and Rhythm: Normal rate and regular rhythm.     Pulses: Normal pulses.     Heart sounds: Normal heart sounds.  Pulmonary:     Effort: Pulmonary effort is normal.     Breath sounds: Normal breath sounds.  Musculoskeletal:     Cervical back: Normal range of motion and neck supple.     Comments: Normal Muscle Bulk and Muscle Testing Reveals:  Upper Extremities: Full ROM and Muscle Strength 5/5 Thoracic Paraspinal Tenderness: T-7-T-9 Lower Extremities: Full ROM and Muscle Strength 5/5 Arises from Chair with ease Narrow Based  Gait     Skin:    General: Skin is warm and dry.  Neurological:     Mental Status: She is alert and oriented to person, place, and time.  Psychiatric:        Mood and Affect: Mood normal.        Behavior: Behavior normal.         Assessment & Plan:  1. Cervical postlaminectomy syndrome:/ Cervicalgia/ Cervical Radiculitis/ with neck and shoulder pain/ Chronic Midline Thoracic Pain.  Continue exercise routine and using heat therapy. 07/06/2022 Continue : current medication  regimen. Refilled: MS Contin 15 mg one tablet three times a day #90.   We will continue the opioid monitoring program, this consists of regular clinic visits, examinations, urine drug screen, pill counts as well as use of [place] Controlled Substance Reporting system. A 12 month History has been reviewed on the [place] on 07/06/2022.   2. Fibromyalgia/ Neuropathy: Continue current medication regimen with Gabapentin, activity and exercise regime. 07/06/2022. 3. Bilateral Knee Pain/ Mild Degenerative Changes L>R: S/P Left Knee Injection on 05/31/2022, with three weeks of relief noted.  Continue current medication regimen with Voltaren Gel. 07/06/2022. 4. Bilateral  Ankle Pain:  No complaints today. Continue HEP as tolerated. Continue to monitor.07/06/2022. 5. Muscle Spasm: Continue current medication regimen with  Robaxin. Continue to Monitor.07/06/2022.. 6. Opioid Induced Constipation: No complaints Continue to Monitor. 07/06/2022. 7. Midline Low Back Pain: Continue current medication regime, continue HEP as tolerated. 07/06/2022. 8. Right Gluteal Medius Weakness and Atrophy: No complaints Today:Continue with Hip Abduction Exercises as tolerated: Continue to Monitor. 07/06/2022. 9. Right Hip Pain/ Right Greater Trochanteric Tenderness: No complaints today. Continue to Alternate Ice and Heat Therapy. Continue to Monitor. 07/06/2022  10. Sacroiliac Joint Pain:  S/P Sacroiliac Injection with good relief noted. 07/06/2022 11. Lumbar Radiculitis: No complaints today.Continue Gabapentin. Continue HEP as tolerated. Continue current medication regimen. Continue to monitor.  07/06/2022.     F/U in 1 month

## 2022-07-19 NOTE — Progress Notes (Unsigned)
Cardiology Clinic Note   Patient Name: [name] Date of Encounter: 07/20/2022  Primary Care Provider:  Owens Loffler, MD Primary Cardiologist:  Glenetta Hew, MD  Patient Profile    [name] 72 year old female presents the clinic today for follow-up evaluation of her essential hypertension and paroxysmal atrial fibrillation.  Past Medical History    Past Medical History:  Diagnosis Date   B12 deficiency    Complication of anesthesia    slow to wake up    Depression    Diabetes mellitus type 2, diet-controlled ([place]) 07/24/2018   Fibromyalgia    Generalized hyperhidrosis 04/17/2014   GERD (gastroesophageal reflux disease)    History of rheumatic fever as a child    Hyperlipemia    Hypertension    denies htn on 05/09/16   HYPOTHYROIDISM 05/29/2007   Irritable bowel syndrome    Migraines    hx of    OSA (obstructive sleep apnea) 06/26/2019   Senile calcific aortic valve sclerosis    Aortic Sclerosis-no stenosis   Past Surgical History:  Procedure Laterality Date   66 HOUR [place] STUDY N/A 01/04/2016   Procedure: 24 HOUR [place] STUDY;  Surgeon: Manus Gunning, MD;  Location: [facility];  Service: Gastroenterology;  Laterality: N/A;   ABDOMINAL HYSTERECTOMY  1988   ADENOIDECTOMY     ANTERIOR CERVICAL DECOMP/DISCECTOMY FUSION  2009, 1993   first procedure in ~ 1993/Dr [place] N/A 12/16/2021   Procedure: ATRIAL FIBRILLATION ABLATION;  Surgeon: Vickie Epley, MD;  Location: [facility];  Service: Cardiovascular;  Laterality: N/A;   BACK SURGERY     CARDIOVERSION N/A 04/03/2019   Procedure: CARDIOVERSION;  Surgeon: Dorothy Spark, MD;  Location: [facility] ENDOSCOPY;  Service: Cardiovascular;  Laterality: N/A;   CHOLECYSTECTOMY  2005   ESOPHAGEAL MANOMETRY N/A 01/04/2016   Procedure: ESOPHAGEAL MANOMETRY (EM) 24 HR PH;  Surgeon: Manus Gunning, MD;  Location: [facility]OSCOPY;  Service:  Gastroenterology;  Laterality: N/A;   ESOPHAGOGASTRODUODENOSCOPY (EGD) WITH PROPOFOL N/A 05/17/2016   Procedure: ESOPHAGOGASTRODUODENOSCOPY (EGD) WITH PROPOFOL;  Surgeon: Manus Gunning, MD;  Location: [facility]OSCOPY;  Service: Gastroenterology;  Laterality: N/A;   NASAL SINUS SURGERY  2006   SAVORY DILATION N/A 05/17/2016   Procedure: SAVORY DILATION;  Surgeon: Manus Gunning, MD;  Location: [facility] ENDOSCOPY;  Service: Gastroenterology;  Laterality: N/A;   TEE WITHOUT CARDIOVERSION N/A 04/03/2019   Procedure: TRANSESOPHAGEAL ECHOCARDIOGRAM (TEE)-with DCCV;  Surgeon: Dorothy Spark, MD;  Location: [facility] ENDOSCOPY;; EF 55 to 60%.  No LVH.  Normal RV.  Moderate LA dilation.  No thrombus.  Mild RA dilation.  Mild to moderate TR.  Mild aortic valve sclerosis, no stenosis.  Moderately elevated PA pressures (47 mmHg)   TONSILLECTOMY AND ADENOIDECTOMY     TRANSTHORACIC ECHOCARDIOGRAM  2009   Normal LV size and function. EF 60-65%. No regional wall motion and amount is. Mild aortic sclerosis, no stenosis   TUBAL LIGATION  1979    Allergies  Allergies  Allergen Reactions   Cyclobenzaprine Hcl Anaphylaxis and Swelling    Reaction - throat closed, tongue swelling   Rizatriptan Benzoate Anaphylaxis    Throat and Tongue Swelling Closed   Sumatriptan Swelling    Throat closed & Tongue Swelling   Tizanidine Shortness Of Breath   Aleve [Naproxen Sodium] Hives   Aspartame Other (See Comments)   Clarithromycin Hives   Corn-Containing Products Other (See Comments)  Head congestion   Doxycycline Diarrhea and Nausea And Vomiting   Duloxetine Nausea Only   Egg [Eggs Or Egg-Derived Products] Diarrhea and Nausea And Vomiting   Keflex [Cephalexin] Itching and Other (See Comments)    Sores   Metronidazole Diarrhea and Nausea Only   Monosodium Glutamate Diarrhea and Other (See Comments)   Oxycodone Hcl Diarrhea and Nausea And Vomiting   Oxycontin [Oxycodone Hcl] Nausea And Vomiting    Penicillins Other (See Comments)    Unknown reaction Did it involve swelling of the face/tongue/throat, SOB, or low BP? Unknown Did it involve sudden or severe rash/hives, skin peeling, or any reaction on the inside of your mouth or nose? Unknown Did you need to seek medical attention at a hospital or doctor's office? Unknown When did it last happen?   infant   If all above answers are "NO", may proceed with cephalosporin use.   Rosuvastatin Other (See Comments)    Muscle aches   Venlafaxine Hives   Voltaren [Diclofenac Sodium] Hives    Reaction to oral med, uses gel occasionally   Septra [Sulfamethoxazole-Trimethoprim] Itching and Rash   Tramadol Diarrhea, Nausea And Vomiting and Palpitations    History of Present Illness    [name] has a PMH of essential hypertension, paroxysmal atrial fibrillation CHA2DS2-VASc score 4 on Eliquis, OSA, GERD, hypothyroidism, type 2 diabetes, fibromyalgia, HLD, depression, obesity, vitamin B12 deficiency, vitamin D deficiency, obesity hypoventilation syndrome, and chronic right sciatic pain.  She was seen by Dr. Ellyn Hack 07/21/2021.  During that time she continued to do well.  She was in the process of OSA evaluation.  She reported 1 short episode of atrial fibrillation in the setting of a GI illness.  The episode resolved as her GI symptoms resolved.  A review of pill in the pocket flecainide treatment for breakthrough A-fib was reviewed.  Her blood pressure was 128/72 with a pulse of 64.  Follow-up was planned for 10 to 11 months.  She was seen in follow-up by Dr. Quentin Ore on 03/16/2022.  During that time she continued to do well status post A-fib ablation had been completed on 12/16/2021.  She denied recurrent episodes of atrial fibrillation.  Her Eliquis was continued.  Follow-up was planned for 6 months.  She presents to the clinic today for follow-up evaluation and states she feels well today.  She reports that she has been walking most days of the  week.  She denies episodes of palpitations but does note that her heart rate is somewhat higher in the morning when she wakes up.  After she takes her Cardizem medicine her heart rate decreases.  Her blood pressure continues to be well-controlled at 124/60.  She reports that her husband passed away in 06/03/23.  She has a hammertoe on her left foot and callus on the bottom of her foot which is causing her some pain.  She reports a failed bunion surgery and is hoping to have the foot operated on.  I have asked her to have the surgeon send [place] a formal request so that we may review it prior to the surgery.  She expressed understanding.  I will plan follow-up in 12 months, have her maintain her physical activity if not increase, continue to follow heart healthy low-sodium high-fiber diet, and continue her current medication regimen.  Today she denies chest pain, shortness of breath, lower extremity edema, fatigue, palpitations, melena, hematuria, hemoptysis, diaphoresis, weakness, presyncope, syncope, orthopnea, and PND.    Home Medications  Prior to Admission medications   Medication Sig Start Date End Date Taking? Authorizing Provider  acetaminophen (TYLENOL) 650 MG CR tablet Take 650 mg by mouth in the morning and at bedtime.    [provider]  apixaban (ELIQUIS) 5 MG TABS tablet Take 1 tablet (5 mg total) by mouth 2 (two) times daily. 06/17/22   [name], MD  cetirizine (ZYRTEC) 10 MG tablet Take 10 mg by mouth at bedtime.    [provider]  Cholecalciferol (VITAMIN D) 125 MCG (5000 UT) CAPS Take 5,000 Units by mouth every morning.     [provider]  clindamycin (CLEOCIN T) 1 % lotion Apply 1 application topically daily as needed (eczema).  06/07/19   [provider]  Coenzyme Q-10 100 MG capsule Take 100 mg by mouth every morning.     [provider]  diclofenac sodium (VOLTAREN) 1 % GEL Apply 4 g topically 4 (four) times daily as needed  (joint pain).     [provider]  diltiazem (CARDIZEM CD) 240 MG 24 hr capsule TAKE 1 CAPSULE BY MOUTH EVERY DAY 12/06/21   [name], MD  diltiazem (CARDIZEM) 30 MG tablet Take one tablet by mouth every 4 hours as needed for heart rate greater than 100, top number blood pressure needs to be above 100 04/14/21   Fenton, Clint R, PA  docusate sodium (COLACE) 250 MG capsule Take 250 mg by mouth at bedtime. 10/22/21   [provider]  fluocinonide (LIDEX) 0.05 % external solution Apply 1 application topically daily as needed (eczema on scalp).  06/07/19   [provider]  gabapentin (NEURONTIN) 300 MG capsule TAKE 1 CAPSULE BY MOUTH 3  TIMES DAILY 03/14/22   Kirsteins, Luanna Salk, MD  hydroxypropyl methylcellulose / hypromellose (ISOPTO TEARS / GONIOVISC) 2.5 % ophthalmic solution Place 1-2 drops into both eyes 3 (three) times daily as needed (dry/irritated eyes.).    [provider]  levothyroxine (SYNTHROID) 50 MCG tablet TAKE 1 TABLET BY MOUTH  DAILY 09/27/21   Copland, Frederico Hamman, MD  loratadine (CLARITIN) 10 MG tablet Take 10 mg by mouth every morning.     [provider]  Magnesium Bisglycinate (MAG GLYCINATE PO) Take 400 mg by mouth at bedtime.    [provider]  methocarbamol (ROBAXIN) 500 MG tablet TAKE 1 TABLET BY MOUTH EVERY 8 HOURS AS NEEDED FOR MUSCLE SPASM 01/06/22   Bayard Hugger, NP  morphine (MS CONTIN) 15 MG 12 hr tablet Take 1 tablet (15 mg total) by mouth 3 (three) times daily. 07/06/22   Bayard Hugger, NP  oxybutynin (DITROPAN-XL) 10 MG 24 hr tablet TAKE 1 TABLET BY MOUTH AT  BEDTIME 09/27/21   Copland, Frederico Hamman, MD  oxymetazoline (AFRIN) 0.05 % nasal spray Place 1 spray into both nostrils 2 (two) times daily as needed for congestion.    [provider]  RABEprazole (ACIPHEX) 20 MG tablet Take 1 tablet (20 mg total) by mouth every evening. 01/02/22   Copland, Frederico Hamman, MD  Resveratrol 100 MG CAPS Take 100 mg by mouth in the  morning and at bedtime.    [provider]  tretinoin (RETIN-A) 0.05 % cream Apply 1 application  topically daily as needed (acne). 06/07/19   [provider]  Wheat Dextrin (BENEFIBER) CHEW Chew 2 tablets by mouth 2 (two) times daily as needed (regularity).    [provider]    Family History    Family History  Problem Relation Age  of Onset   Breast cancer Mother    Diabetes Mother    Heart disease Mother    Allergies Mother    Diabetes Father    Heart disease Father    Emphysema Father    Allergies Father    Breast cancer Paternal Aunt    Colon cancer Neg Hx    Esophageal cancer Neg Hx    Rectal cancer Neg Hx    Stomach cancer Neg Hx    She indicated that her mother is deceased. She indicated that her father is deceased. She indicated that the status of her maternal uncle is unknown. She indicated that the status of her paternal aunt is unknown. She indicated that the status of her neg hx is unknown.  Social History    Social History   Socioeconomic History   Marital status: Widowed    Spouse name: Not on file   Number of children: Not on file   Years of education: Not on file   Highest education level: Not on file  Occupational History   Occupation: Radiation protection practitioner   Occupation: retired  Tobacco Use   Smoking status: Never   Smokeless tobacco: Never   Tobacco comments:    Never smoke 01/13/22  Vaping Use   Vaping Use: Never used  Substance and Sexual Activity   Alcohol use: Yes    Alcohol/week: 1.0 standard drink of alcohol    Types: 1 Glasses of wine per week    Comment: rare   Drug use: Never   Sexual activity: Not Currently  Other Topics Concern   Not on file  Social History Narrative   Not on file   Social Determinants of Health   Financial Resource Strain: Low Risk  (06/16/2022)   Overall Financial Resource Strain (CARDIA)    Difficulty of Paying Living Expenses: Not hard at all  Food Insecurity: No Food Insecurity  (06/16/2022)   Hunger Vital Sign    Worried About Running Out of Food in the Last Year: Never true    Ran Out of Food in the Last Year: Never true  Transportation Needs: No Transportation Needs (06/16/2022)   PRAPARE - Hydrologist (Medical): No    Lack of Transportation (Non-Medical): No  Physical Activity: Sufficiently Active (05/31/2021)   Exercise Vital Sign    Days of Exercise per Week: 7 days    Minutes of Exercise per Session: 30 min  Stress: No Stress Concern Present (06/16/2022)   [place]    Feeling of Stress : Not at all  Social Connections: Moderately Integrated (06/16/2022)   Social Connection and Isolation Panel [NHANES]    Frequency of Communication with Friends and Family: Three times a week    Frequency of Social Gatherings with Friends and Family: More than three times a week    Attends Religious Services: More than 4 times per year    Active Member of Clubs or Organizations: Yes    Attends Archivist Meetings: More than 4 times per year    Marital Status: Widowed  Intimate Partner Violence: Not At Risk (06/16/2022)   Humiliation, Afraid, Rape, and Kick questionnaire    Fear of Current or Ex-Partner: No    Emotionally Abused: No    Physically Abused: No    Sexually Abused: No     Review of Systems    General:  No chills, fever, night sweats or weight changes.  Cardiovascular:  No  chest pain, dyspnea on exertion, edema, orthopnea, palpitations, paroxysmal nocturnal dyspnea. Dermatological: No rash, lesions/masses Respiratory: No cough, dyspnea Urologic: No hematuria, dysuria Abdominal:   No nausea, vomiting, diarrhea, bright red blood per rectum, melena, or hematemesis Neurologic:  No visual changes, wkns, changes in mental status. All other systems reviewed and are otherwise negative except as noted above.  Physical Exam    VS:  BP 124/60   Pulse 71    Ht '5\' 4"'$  (1.626 m)   Wt 181 lb 3.2 oz (82.2 kg)   SpO2 98%   BMI 31.10 kg/m  , BMI Body mass index is 31.1 kg/m. GEN: Well nourished, well developed, in no acute distress. HEENT: normal. Neck: Supple, no JVD, carotid bruits, or masses. Cardiac: RRR, no murmurs, rubs, or gallops. No clubbing, cyanosis, generalized ankle nonpitting edema.  Radials/DP/PT 2+ and equal bilaterally.  Respiratory:  Respirations regular and unlabored, clear to auscultation bilaterally. GI: Soft, nontender, nondistended, BS + x 4. MS: no deformity or atrophy. Skin: warm and dry, no rash. Neuro:  Strength and sensation are intact. Psych: Normal affect.  Accessory Clinical Findings    Recent Labs: 08/23/2021: ALT 23; TSH 4.09 11/25/2021: BUN 16; Creatinine, Ser 0.70; Hemoglobin 13.1; Platelets 259; Potassium 4.6; Sodium 140   Recent Lipid Panel    Component Value Date/Time   CHOL 196 08/23/2021 1129   TRIG 233.0 (H) 08/23/2021 1129   HDL 45.80 08/23/2021 1129   CHOLHDL 4 08/23/2021 1129   VLDL 46.6 (H) 08/23/2021 1129   LDLCALC 108 (H) 03/12/2020 1325   LDLDIRECT 132.0 08/23/2021 1129         ECG personally reviewed by me today-none today.  TEE 04/03/2019 IMPRESSIONS     1. Left ventricular ejection fraction, by visual estimation, is 55 to  60%. The left ventricle has normal function. Normal left ventricular size.  There is no left ventricular hypertrophy.   2. Global right ventricle has normal systolic function.The right  ventricular size is normal. No increase in right ventricular wall  thickness.   3. Left atrial size was moderately dilated.   4. No thrombus is seen in the left atrium or left atrial appendage.   5. Right atrial size was mildly dilated.   6. The mitral valve is normal in structure. Mild mitral valve  regurgitation. No evidence of mitral stenosis.   7. The tricuspid valve is normal in structure. Tricuspid valve  regurgitation mild-moderate.   8. The aortic valve is normal  in structure. Aortic valve regurgitation is  mild by color flow Doppler. Mild aortic valve sclerosis without stenosis.   9. The pulmonic valve was normal in structure. Pulmonic valve  regurgitation is not visualized by color flow Doppler.  10. Moderately elevated pulmonary artery systolic pressure.  11. The tricuspid regurgitant velocity is 3.12 m/s, and with an assumed  right atrial pressure of 8 mmHg, the estimated right ventricular systolic  pressure is moderately elevated at 46.9 mmHg.  12. The inferior vena cava is normal in size with greater than 50%  respiratory variability, suggesting right atrial pressure of 3 mmHg.  13. No thrombus is seen in the left atrium or left atrial appendage.  14. This TEE was followed by a successful cardioversion.   FINDINGS   Left Ventricle: Left ventricular ejection fraction, by visual estimation,  is 55 to 60%. The left ventricle has normal function. There is no left  ventricular hypertrophy. Normal left ventricular size.   Right Ventricle: The right ventricular size is normal.  No increase in  right ventricular wall thickness. Global RV systolic function is has  normal systolic function. The tricuspid regurgitant velocity is 3.12 m/s,  and with an assumed right atrial pressure   of 8 mmHg, the estimated right ventricular systolic pressure is  moderately elevated at 46.9 mmHg.   Left Atrium: Left atrial size was moderately dilated. No thrombus is seen  in the left atrium or left atrial appendage.   Right Atrium: Right atrial size was mildly dilated   Pericardium: There is no evidence of pericardial effusion.   Mitral Valve: The mitral valve is normal in structure. No evidence of  mitral valve stenosis by observation. Mild mitral valve regurgitation.   Tricuspid Valve: The tricuspid valve is normal in structure. Tricuspid  valve regurgitation mild-moderate by color flow Doppler.   Aortic Valve: The aortic valve is normal in structure. Aortic  valve  regurgitation is mild by color flow Doppler. Mild aortic valve sclerosis  is present, with no evidence of aortic valve stenosis.   Pulmonic Valve: The pulmonic valve was normal in structure. Pulmonic valve  regurgitation is not visualized by color flow Doppler.   Aorta: The aortic root, ascending aorta and aortic arch are all  structurally normal, with no evidence of dilitation or obstruction.   Venous: The inferior vena cava is normal in size with greater than 50%  respiratory variability, suggesting right atrial pressure of 3 mmHg.   Shunts: No ventricular septal defect is seen or detected. There is no  evidence of an atrial septal defect. No atrial level shunt detected by  color flow Doppler.   Additional Comments: No thrombus is seen in the left atrium or left atrial  appendage. This TEE was followed by a successful cardioversion.    TRICUSPID VALVE             Normals  TR Peak grad:   38.9 mmHg  TR Vmax:        312.00 cm/s 288 cm/s     [name] MD  Electronically signed by [name] MD  Signature Date/Time: 04/03/2019/12:37:47 PM      Exercise tolerance test 09/25/2019  There was no ST segment deviation noted during stress.   Exercise time: 3 min 37 seconds Workload: 5.3 METS Test stopped due to: SOB BP response: Elevated without hypertensive response HR response: 88% maximal heart rate Rhythm: sinus rhythm, one PAC in recovery   Patient on flecanide. No change in QRS duration with exercise.  No ischemia on stress ECG at low workload.    Assessment & Plan   1.  Paroxysmal atrial fibrillation-denies recent episodes of accelerated or irregular heartbeat.  She is status post A-fib ablation 6/23.  She reports compliance with her apixaban and denies bleeding issues. Continue apixaban, diltiazem Heart healthy low-sodium diet Increase physical activity as tolerated Avoid triggers caffeine, chocolate, EtOH, dehydration etc.  Essential  hypertension-BP today 124/60 Continue diltiazem Heart healthy low-sodium diet-salty 6 given Increase physical activity as tolerated  Hyperlipidemia-LDL 108 on 02/1620 Benefiber, co-Q10 Heart healthy low-sodium high-fiber diet Increase physical activity as tolerated  Obesity-weight today 181 lbs.  Remains fairly physically active. Increase physical activity as tolerated Continue weight loss  Disposition: Follow-up with Dr. Ellyn Hack in 9-12 months.   Jossie Ng. Trease Bremner NP-C     07/20/2022, 3:27 PM [place] [address] Office [phone] Fax [phone]    I spent 14 minutes examining this patient, reviewing medications, and using patient centered shared decision  making involving her cardiac care.  Prior to her visit I spent greater than 20 minutes reviewing her past medical history,  medications, and prior cardiac tests.

## 2022-07-20 ENCOUNTER — Encounter: Payer: Self-pay | Admitting: General Practice

## 2022-07-20 ENCOUNTER — Ambulatory Visit: Payer: Medicare Other | Attending: General Practice | Admitting: General Practice

## 2022-07-20 VITALS — BP 124/60 | HR 71 | Ht 64.0 in | Wt 181.2 lb

## 2022-07-20 DIAGNOSIS — I10 Essential (primary) hypertension: Secondary | ICD-10-CM

## 2022-07-20 DIAGNOSIS — E785 Hyperlipidemia, unspecified: Secondary | ICD-10-CM

## 2022-07-20 DIAGNOSIS — E669 Obesity, unspecified: Secondary | ICD-10-CM | POA: Diagnosis not present

## 2022-07-20 DIAGNOSIS — I480 Paroxysmal atrial fibrillation: Secondary | ICD-10-CM | POA: Diagnosis not present

## 2022-07-20 NOTE — Patient Instructions (Addendum)
Medication Instructions:  The current medical regimen is effective;  continue present plan and medications as directed. Please refer to the Current Medication list given to you today.  *If you need a refill on your cardiac medications before your next appointment, please call your pharmacy*  Lab Work: NONE If you have labs (blood work) drawn today and your tests are completely normal, you will receive your results only by: [place] (if you have MyChart) OR  A paper copy in the mail If you have any lab test that is abnormal or we need to change your treatment, we will call you to review the results.  Other Instructions PLEASE READ AND FOLLOW INCREASED FIBER DIET   Follow-Up: At [facility], you and your health needs are our priority.  As part of our continuing mission to provide you with exceptional heart care, we have created designated Provider Care Teams.  These Care Teams include your primary Cardiologist (physician) and Advanced Practice Providers (APPs -  Physician Assistants and Nurse Practitioners) who all work together to provide you with the care you need, when you need it.  We recommend signing up for the patient portal called "MyChart".  Sign up information is provided on this After Visit Summary.  MyChart is used to connect with patients for Virtual Visits (Telemedicine).  Patients are able to view lab/test results, encounter notes, upcoming appointments, etc.  Non-urgent messages can be sent to your provider as well.   To learn more about what you can do with MyChart, go to [email or web address].    Your next appointment:   12 month(s)  Provider:   Glenetta Hew, MD      High-Fiber Eating Plan Fiber, also called dietary fiber, is a type of carbohydrate. It is found foods such as fruits, vegetables, whole grains, and beans. A high-fiber diet can have many health benefits. Your health care provider may recommend a high-fiber diet to help: Prevent  constipation. Fiber can make your bowel movements more regular. Lower your cholesterol. Relieve the following conditions: Inflammation of veins in the anus (hemorrhoids). Inflammation of specific areas of the digestive tract (uncomplicated diverticulosis). A problem of the large intestine, also called the colon, that sometimes causes pain and diarrhea (irritable bowel syndrome, or IBS). Prevent overeating as part of a weight-loss plan. Prevent heart disease, type 2 diabetes, and certain cancers. What are tips for following this plan? Reading food labels  Check the nutrition facts label on food products for the amount of dietary fiber. Choose foods that have 5 grams of fiber or more per serving. The goals for recommended daily fiber intake include: Men (age 70 or younger): 34-38 g. Men (over age 10): 28-34 g. Women (age 49 or younger): 25-28 g. Women (over age 2): 22-25 g. Shopping Choose whole fruits and vegetables instead of processed forms, such as apple juice or applesauce. Choose a wide variety of high-fiber foods such as avocados, lentils, oats, and kidney beans. Read the nutrition facts label of the foods you choose. Be aware of foods with added fiber. These foods often have high sugar and sodium amounts per serving. Cooking Use whole-grain flour for baking and cooking. Cook with brown rice instead of white rice. Meal planning Start the day with a breakfast that is high in fiber, such as a cereal that contains 5 g of fiber or more per serving. Eat breads and cereals that are made with whole-grain flour instead of refined flour or white flour. Eat brown rice, bulgur wheat, or  millet instead of white rice. Use beans in place of meat in soups, salads, and pasta dishes. Be sure that half of the grains you eat each day are whole grains. General information You can get the recommended daily intake of dietary fiber by: Eating a variety of fruits, vegetables, grains, nuts, and  beans. Taking a fiber supplement if you are not able to take in enough fiber in your diet. It is better to get fiber through food than from a supplement. Gradually increase how much fiber you consume. If you increase your intake of dietary fiber too quickly, you may have bloating, cramping, or gas. Drink plenty of water to help you digest fiber. Choose high-fiber snacks, such as berries, raw vegetables, nuts, and popcorn. What foods should I eat? Fruits Berries. Pears. Apples. Oranges. Avocado. Prunes and raisins. Dried figs. Vegetables Sweet potatoes. Spinach. Kale. Artichokes. Cabbage. Broccoli. Cauliflower. Green peas. Carrots. Squash. Grains Whole-grain breads. Multigrain cereal. Oats and oatmeal. Brown rice. Barley. Bulgur wheat. [place]. Quinoa. Bran muffins. Popcorn. Rye wafer crackers. Meats and other proteins Navy beans, kidney beans, and pinto beans. Soybeans. Split peas. Lentils. Nuts and seeds. Dairy Fiber-fortified yogurt. Beverages Fiber-fortified soy milk. Fiber-fortified orange juice. Other foods Fiber bars. The items listed above may not be a complete list of recommended foods and beverages. Contact a dietitian for more information. What foods should I avoid? Fruits Fruit juice. Cooked, strained fruit. Vegetables Fried potatoes. Canned vegetables. Well-cooked vegetables. Grains White bread. Pasta made with refined flour. White rice. Meats and other proteins Fatty cuts of meat. Fried chicken or fried fish. Dairy Milk. Yogurt. Cream cheese. Sour cream. Fats and oils Butters. Beverages Soft drinks. Other foods Cakes and pastries. The items listed above may not be a complete list of foods and beverages to avoid. Talk with your dietitian about what choices are best for you. Summary Fiber is a type of carbohydrate. It is found in foods such as fruits, vegetables, whole grains, and beans. A high-fiber diet has many benefits. It can help to prevent constipation, lower  blood cholesterol, aid weight loss, and reduce your risk of heart disease, diabetes, and certain cancers. Increase your intake of fiber gradually. Increasing fiber too quickly may cause cramping, bloating, and gas. Drink plenty of water while you increase the amount of fiber you consume. The best sources of fiber include whole fruits and vegetables, whole grains, nuts, seeds, and beans. This information is not intended to replace advice given to you by your health care provider. Make sure you discuss any questions you have with your health care provider. Document Revised: 10/17/2019 Document Reviewed: 10/17/2019 Elsevier Patient Education  [place].

## 2022-08-02 ENCOUNTER — Other Ambulatory Visit: Payer: Self-pay | Admitting: Family Medicine

## 2022-08-04 ENCOUNTER — Encounter (HOSPITAL_COMMUNITY): Payer: Self-pay | Admitting: *Deleted

## 2022-08-04 ENCOUNTER — Encounter: Payer: Medicare Other | Attending: Physical Medicine & Rehabilitation | Admitting: Registered Nurse

## 2022-08-04 ENCOUNTER — Encounter: Payer: Self-pay | Admitting: Registered Nurse

## 2022-08-04 VITALS — BP 122/66 | HR 60 | Ht 64.0 in | Wt 181.0 lb

## 2022-08-04 DIAGNOSIS — Z5181 Encounter for therapeutic drug level monitoring: Secondary | ICD-10-CM | POA: Diagnosis present

## 2022-08-04 DIAGNOSIS — M546 Pain in thoracic spine: Secondary | ICD-10-CM | POA: Insufficient documentation

## 2022-08-04 DIAGNOSIS — G8929 Other chronic pain: Secondary | ICD-10-CM | POA: Insufficient documentation

## 2022-08-04 DIAGNOSIS — G894 Chronic pain syndrome: Secondary | ICD-10-CM | POA: Diagnosis present

## 2022-08-04 DIAGNOSIS — M1712 Unilateral primary osteoarthritis, left knee: Secondary | ICD-10-CM | POA: Diagnosis present

## 2022-08-04 DIAGNOSIS — Z79891 Long term (current) use of opiate analgesic: Secondary | ICD-10-CM | POA: Diagnosis present

## 2022-08-04 MED ORDER — MORPHINE SULFATE ER 15 MG PO TBCR: 15.0000 mg | EXTENDED_RELEASE_TABLET | Freq: Three times a day (TID) | ORAL | 0 refills | Status: DC

## 2022-08-04 NOTE — Progress Notes (Signed)
Subjective:    Patient ID: [name], female    DOB: 04-26-1951, 72 y.o.   MRN: [id number]  HPI: [name] is a 72 y.o. female who returns for follow up appointment for chronic pain and medication refill. She states her pain is located in her mid-back. She rates her pain 3. Her current exercise regime is walking and performing stretching exercises.  [name] Morphine equivalent is 45.00 MME.   Last UDS was Performed on 03/02/2022, it was consistent.     Pain Inventory Average Pain 4 Pain Right Now 3 My pain is constant, burning, and aching  In the last 24 hours, has pain interfered with the following? General activity 3 Relation with others 3 Enjoyment of life 3 What TIME of day is your pain at its worst? evening and night Sleep (in general) Good  Pain is worse with: walking, bending, standing, and some activites Pain improves with: rest, heat/ice, and medication Relief from Meds: 6  Family History  Problem Relation Age of Onset   Breast cancer Mother    Diabetes Mother    Heart disease Mother    Allergies Mother    Diabetes Father    Heart disease Father    Emphysema Father    Allergies Father    Breast cancer Paternal Aunt    Colon cancer Neg Hx    Esophageal cancer Neg Hx    Rectal cancer Neg Hx    Stomach cancer Neg Hx    Social History   Socioeconomic History   Marital status: Widowed    Spouse name: Not on file   Number of children: Not on file   Years of education: Not on file   Highest education level: Not on file  Occupational History   Occupation: Radiation protection practitioner   Occupation: retired  Tobacco Use   Smoking status: Never   Smokeless tobacco: Never   Tobacco comments:    Never smoke 01/13/22  Vaping Use   Vaping Use: Never used  Substance and Sexual Activity   Alcohol use: Yes    Alcohol/week: 1.0 standard drink of alcohol    Types: 1 Glasses of wine per week    Comment: rare   Drug use: Never   Sexual activity: Not Currently  Other  Topics Concern   Not on file  Social History Narrative   Not on file   Social Determinants of Health   Financial Resource Strain: Low Risk  (06/16/2022)   Overall Financial Resource Strain (CARDIA)    Difficulty of Paying Living Expenses: Not hard at all  Food Insecurity: No Food Insecurity (06/16/2022)   Hunger Vital Sign    Worried About Running Out of Food in the Last Year: Never true    Ran Out of Food in the Last Year: Never true  Transportation Needs: No Transportation Needs (06/16/2022)   PRAPARE - Hydrologist (Medical): No    Lack of Transportation (Non-Medical): No  Physical Activity: Sufficiently Active (05/31/2021)   Exercise Vital Sign    Days of Exercise per Week: 7 days    Minutes of Exercise per Session: 30 min  Stress: No Stress Concern Present (06/16/2022)   [place]    Feeling of Stress : Not at all  Social Connections: Moderately Integrated (06/16/2022)   Social Connection and Isolation Panel [NHANES]    Frequency of Communication with Friends and Family: Three times a week  Frequency of Social Gatherings with Friends and Family: More than three times a week    Attends Religious Services: More than 4 times per year    Active Member of Clubs or Organizations: Yes    Attends Archivist Meetings: More than 4 times per year    Marital Status: Widowed   Past Surgical History:  Procedure Laterality Date   56 HOUR [place] STUDY N/A 01/04/2016   Procedure: 24 HOUR PH STUDY;  Surgeon: Manus Gunning, MD;  Location: [facility] END[facility];  Service: Gastroenterology;  Laterality: N/A;   ABDOMINAL HYSTERECTOMY  1988   ADENOIDECTOMY     ANTERIOR CERVICAL DECOMP/DISCECTOMY FUSION  2009, 1993   first procedure in ~ 1993/Dr [place] N/A 12/16/2021   Procedure: ATRIAL FIBRILLATION ABLATION;  Surgeon: Vickie Epley, MD;   Location: [place] [facility];  Service: Cardiovascular;  Laterality: N/A;   BACK SURGERY     CARDIOVERSION N/A 04/03/2019   Procedure: CARDIOVERSION;  Surgeon: Dorothy Spark, MD;  Location: [facility] ENDOSCOPY;  Service: Cardiovascular;  Laterality: N/A;   CHOLECYSTECTOMY  2005   ESOPHAGEAL MANOMETRY N/A 01/04/2016   Procedure: ESOPHAGEAL MANOMETRY (EM) 24 HR PH;  Surgeon: Manus Gunning, MD;  Location: [facility];  Service: Gastroenterology;  Laterality: N/A;   ESOPHAGOGASTRODUODENOSCOPY (EGD) WITH PROPOFOL N/A 05/17/2016   Procedure: ESOPHAGOGASTRODUODENOSCOPY (EGD) WITH PROPOFOL;  Surgeon: Manus Gunning, MD;  Location: [facility];  Service: Gastroenterology;  Laterality: N/A;   NASAL SINUS SURGERY  2006   SAVORY DILATION N/A 05/17/2016   Procedure: SAVORY DILATION;  Surgeon: Manus Gunning, MD;  Location: [facility];  Service: Gastroenterology;  Laterality: N/A;   TEE WITHOUT CARDIOVERSION N/A 04/03/2019   Procedure: TRANSESOPHAGEAL ECHOCARDIOGRAM (TEE)-with DCCV;  Surgeon: Dorothy Spark, MD;  Location: [facility] ENDOSCOPY;; EF 55 to 60%.  No LVH.  Normal RV.  Moderate LA dilation.  No thrombus.  Mild RA dilation.  Mild to moderate TR.  Mild aortic valve sclerosis, no stenosis.  Moderately elevated PA pressures (47 mmHg)   TONSILLECTOMY AND ADENOIDECTOMY     TRANSTHORACIC ECHOCARDIOGRAM  2009   Normal LV size and function. EF 60-65%. No regional wall motion and amount is. Mild aortic sclerosis, no stenosis   TUBAL LIGATION  1979   Past Surgical History:  Procedure Laterality Date   66 HOUR [place] STUDY N/A 01/04/2016   Procedure: 24 HOUR PH STUDY;  Surgeon: Manus Gunning, MD;  Location: [facility];  Service: Gastroenterology;  Laterality: N/A;   ABDOMINAL HYSTERECTOMY  1988   ADENOIDECTOMY     ANTERIOR CERVICAL DECOMP/DISCECTOMY FUSION  2009, 1993   first procedure in ~ 1993/Dr [place] N/A 12/16/2021    Procedure: ATRIAL FIBRILLATION ABLATION;  Surgeon: Vickie Epley, MD;  Location: [place] CV LAB;  Service: Cardiovascular;  Laterality: N/A;   BACK SURGERY     CARDIOVERSION N/A 04/03/2019   Procedure: CARDIOVERSION;  Surgeon: Dorothy Spark, MD;  Location: [facility] ENDOSCOPY;  Service: Cardiovascular;  Laterality: N/A;   CHOLECYSTECTOMY  2005   ESOPHAGEAL MANOMETRY N/A 01/04/2016   Procedure: ESOPHAGEAL MANOMETRY (EM) 24 HR PH;  Surgeon: Manus Gunning, MD;  Location: [facility];  Service: Gastroenterology;  Laterality: N/A;   ESOPHAGOGASTRODUODENOSCOPY (EGD) WITH PROPOFOL N/A 05/17/2016   Procedure: ESOPHAGOGASTRODUODENOSCOPY (EGD) WITH PROPOFOL;  Surgeon: Manus Gunning, MD;  Location: [facility] END[facility];  Service: Gastroenterology;  Laterality: N/A;  NASAL SINUS SURGERY  2006   SAVORY DILATION N/A 05/17/2016   Procedure: SAVORY DILATION;  Surgeon: Manus Gunning, MD;  Location: [facility] ENDOSCOPY;  Service: Gastroenterology;  Laterality: N/A;   TEE WITHOUT CARDIOVERSION N/A 04/03/2019   Procedure: TRANSESOPHAGEAL ECHOCARDIOGRAM (TEE)-with DCCV;  Surgeon: Dorothy Spark, MD;  Location: [facility] ENDOSCOPY;; EF 55 to 60%.  No LVH.  Normal RV.  Moderate LA dilation.  No thrombus.  Mild RA dilation.  Mild to moderate TR.  Mild aortic valve sclerosis, no stenosis.  Moderately elevated PA pressures (47 mmHg)   TONSILLECTOMY AND ADENOIDECTOMY     TRANSTHORACIC ECHOCARDIOGRAM  2009   Normal LV size and function. EF 60-65%. No regional wall motion and amount is. Mild aortic sclerosis, no stenosis   TUBAL LIGATION  1979   Past Medical History:  Diagnosis Date   B12 deficiency    Complication of anesthesia    slow to wake up    Depression    Diabetes mellitus type 2, diet-controlled ([place]) 07/24/2018   Fibromyalgia    Generalized hyperhidrosis 04/17/2014   GERD (gastroesophageal reflux disease)    History of rheumatic fever as a child    Hyperlipemia    Hypertension    denies  htn on 05/09/16   HYPOTHYROIDISM 05/29/2007   Irritable bowel syndrome    Migraines    hx of    OSA (obstructive sleep apnea) 06/26/2019   Senile calcific aortic valve sclerosis    Aortic Sclerosis-no stenosis   There were no vitals taken for this visit.  Opioid Risk Score:   Fall Risk Score:  `1  Depression screen PHQ 2/9     07/06/2022    1:19 PM 06/16/2022    2:01 PM 05/31/2022   11:12 AM 04/06/2022    1:08 PM 03/02/2022   10:48 AM 01/21/2022    1:17 PM 01/05/2022   11:34 AM  Depression screen PHQ 2/9  Decreased Interest 0 0 0 0 0 0 0  Down, Depressed, Hopeless 0 0 0  0 0 0  PHQ - 2 Score 0 0 0 0 0 0 0    Review of Systems  Musculoskeletal:  Positive for back pain and neck stiffness.       Left knee pain  All other systems reviewed and are negative.      Objective:   Physical Exam Vitals and nursing note reviewed.  Constitutional:      Appearance: Normal appearance.  Neck:     Comments: Cervical Paraspinal Tenderness: C-5-C-6 Cardiovascular:     Rate and Rhythm: Normal rate and regular rhythm.     Pulses: Normal pulses.     Heart sounds: Normal heart sounds.  Pulmonary:     Effort: Pulmonary effort is normal.     Breath sounds: Normal breath sounds.  Musculoskeletal:     Cervical back: Normal range of motion and neck supple.     Comments: Normal Muscle Bulk and Muscle Testing Reveals:  Upper Extremities: Full ROM and Muscle Strength 5/5  Thoracic Paraspinal Tenderness: T-7-T-9  Lower Extremities: Full ROM and Muscle Strength 5/5 Arises from Chair with ease Narrow Based  Gait     Skin:    General: Skin is warm and dry.  Neurological:     Mental Status: She is alert and oriented to person, place, and time.  Psychiatric:        Mood and Affect: Mood normal.        Behavior: Behavior normal.  Assessment & Plan:  1. Cervical postlaminectomy syndrome:/ Cervicalgia/ Cervical Radiculitis/ with neck and shoulder pain/ Chronic Midline Thoracic Pain.   Continue exercise routine and using heat therapy. 08/04/2022 Continue : current medication regimen. Refilled: MS Contin 15 mg one tablet three times a day #90.   We will continue the opioid monitoring program, this consists of regular clinic visits, examinations, urine drug screen, pill counts as well as use of [place] Controlled Substance Reporting system. A 12 month History has been reviewed on the [place] Controlled Substance Reporting System on 08/04/2022.   2. Fibromyalgia/ Neuropathy: Continue current medication regimen with Gabapentin, activity and exercise regime. 08/04/2022. 3. Bilateral Knee Pain/ Mild Degenerative Changes L>R: S/P Left Knee Injection on 05/31/2022, with three weeks of relief noted.  Continue current medication regimen with Voltaren Gel. 08/04/2022. 4. Bilateral  Ankle Pain:  No complaints today. Continue HEP as tolerated. Continue to monitor.08/04/2022. 5. Muscle Spasm: Continue current medication regimen with  Robaxin. Continue to Monitor.08/04/2022.. 6. Opioid Induced Constipation: No complaints Continue to Monitor. 08/04/2022. 7. Midline Low Back Pain: Continue current medication regime, continue HEP as tolerated. 08/04/2022. 8. Right Gluteal Medius Weakness and Atrophy: No complaints Today:Continue with Hip Abduction Exercises as tolerated: Continue to Monitor. 08/04/2022. 9. Right Hip Pain/ Right Greater Trochanteric Tenderness: No complaints today. Continue to Alternate Ice and Heat Therapy. Continue to Monitor. 08/04/2022  10. Sacroiliac Joint Pain:  S/P Sacroiliac Injection with good relief noted. 08/04/2022 11. Lumbar Radiculitis: No complaints today.Continue Gabapentin. Continue HEP as tolerated. Continue current medication regimen. Continue to monitor.  08/04/2022.     F/U in 1 month

## 2022-08-10 ENCOUNTER — Telehealth: Payer: Self-pay

## 2022-08-10 NOTE — Telephone Encounter (Signed)
Alicia Sells,  Ms. Corban was seen in office by you on 07/20/2022.  She is scheduled to have a hammertoe correction procedure.  Do you think she is at acceptable risk to proceed with her surgery at this time. Please send your reply to [facility] PREOP.  Thank you,

## 2022-08-10 NOTE — Telephone Encounter (Signed)
   Pre-operative Risk Assessment    Patient Name: [name]  DOB: [birth year] MRN: [id number]      Request for Surgical Clearance    Procedure:   Left hallux IPJ arthrodesis; left 2nd and 3rd MT weil osteotomies; extensor tendon lengthening; hammertoe corrections.  Date of Surgery:  Clearance TBD                                 Surgeon:  Dr Wylene Simmer Surgeon's Group or Practice Name:  [facility] Phone number:  [phone] Fax number:  [phone]   Type of Clearance Requested:   - Medical  - Pharmacy:  Hold Apixaban (Eliquis)     Type of Anesthesia:  General & regional   Additional requests/questions:   N/A  Toma Deiters   08/10/2022, 4:41 PM

## 2022-08-10 NOTE — Telephone Encounter (Signed)
Pharmacy please advise on holding Eliquis prior to left hammertoe correction procedure scheduled for TBD. Thank you.

## 2022-08-11 NOTE — Telephone Encounter (Signed)
   Name: [name]  DOB: [birth year]  MRN: [id number]   Primary Cardiologist: Glenetta Hew, MD  Chart reviewed as part of pre-operative protocol coverage.   She was last seen in clinic by Coletta Memos NP on 07/20/22. Per [place]:   Ms. Prosser may proceed with surgery without further testing.  She would be acceptable risk from a cardiac standpoint.   Per our clinical pharmD: Patient with diagnosis of afib on Eliquis for anticoagulation.     Procedure: Left hallux IPJ arthrodesis; left 2nd and 3rd MT weil osteotomies; extensor tendon lengthening; hammertoe corrections   Date of procedure: TBD   CHA2DS2-VASc Score = 4  This indicates a 4.8% annual risk of stroke. The patient's score is based upon: CHF History: 0 HTN History: 1 Diabetes History: 1 Stroke History: 0 Vascular Disease History: 0 Age Score: 1 Gender Score: 1   Underwent afib ablation 11/2021.   CrCl 59m/min using adjusted body weight Platelet count 259K   Per office protocol, patient can hold Eliquis for 2 days prior to procedure.    I will route this recommendation to the requesting party via Epic fax function and remove from pre-op pool. Please call with questions.  ATami LinDuke, PA 08/11/2022, 3:51 PM

## 2022-08-11 NOTE — Telephone Encounter (Signed)
Patient with diagnosis of afib on Eliquis for anticoagulation.    Procedure: Left hallux IPJ arthrodesis; left 2nd and 3rd MT weil osteotomies; extensor tendon lengthening; hammertoe corrections   Date of procedure: TBD  CHA2DS2-VASc Score = 4  This indicates a 4.8% annual risk of stroke. The patient's score is based upon: CHF History: 0 HTN History: 1 Diabetes History: 1 Stroke History: 0 Vascular Disease History: 0 Age Score: 1 Gender Score: 1   Underwent afib ablation 11/2021.  CrCl 31m/min using adjusted body weight Platelet count 259K  Per office protocol, patient can hold Eliquis for 2 days prior to procedure.    **This guidance is not considered finalized until pre-operative APP has relayed final recommendations.**

## 2022-08-26 HISTORY — PX: FOOT SURGERY: SHX648

## 2022-08-27 ENCOUNTER — Other Ambulatory Visit: Payer: Self-pay | Admitting: Family Medicine

## 2022-08-27 DIAGNOSIS — E038 Other specified hypothyroidism: Secondary | ICD-10-CM

## 2022-08-27 DIAGNOSIS — Z79899 Other long term (current) drug therapy: Secondary | ICD-10-CM

## 2022-08-27 DIAGNOSIS — E538 Deficiency of other specified B group vitamins: Secondary | ICD-10-CM

## 2022-08-27 DIAGNOSIS — E785 Hyperlipidemia, unspecified: Secondary | ICD-10-CM

## 2022-08-27 DIAGNOSIS — E119 Type 2 diabetes mellitus without complications: Secondary | ICD-10-CM

## 2022-08-27 DIAGNOSIS — E559 Vitamin D deficiency, unspecified: Secondary | ICD-10-CM

## 2022-08-29 ENCOUNTER — Encounter: Payer: Medicare Other | Attending: Physical Medicine & Rehabilitation | Admitting: Registered Nurse

## 2022-08-29 VITALS — BP 135/73 | HR 62 | Ht 64.0 in | Wt 177.0 lb

## 2022-08-29 DIAGNOSIS — M1712 Unilateral primary osteoarthritis, left knee: Secondary | ICD-10-CM | POA: Diagnosis present

## 2022-08-29 DIAGNOSIS — G894 Chronic pain syndrome: Secondary | ICD-10-CM | POA: Diagnosis not present

## 2022-08-29 DIAGNOSIS — G8929 Other chronic pain: Secondary | ICD-10-CM | POA: Diagnosis present

## 2022-08-29 DIAGNOSIS — Z5181 Encounter for therapeutic drug level monitoring: Secondary | ICD-10-CM | POA: Insufficient documentation

## 2022-08-29 DIAGNOSIS — M546 Pain in thoracic spine: Secondary | ICD-10-CM | POA: Insufficient documentation

## 2022-08-29 DIAGNOSIS — Z79891 Long term (current) use of opiate analgesic: Secondary | ICD-10-CM | POA: Diagnosis not present

## 2022-08-29 MED ORDER — MORPHINE SULFATE ER 15 MG PO TBCR: 15.0000 mg | EXTENDED_RELEASE_TABLET | Freq: Three times a day (TID) | ORAL | 0 refills | Status: DC

## 2022-08-29 NOTE — Progress Notes (Signed)
Subjective:    Patient ID: [name], female    DOB: 23-Aug-1950, 72 y.o.   MRN: [id number]  HPI: [name] is a 72 y.o. female who returns for follow up appointment for chronic pain and medication refill. She states her  pain is located in her mid- back and left foot. She rates her pain 4. Her current exercise regime is walking short distances and performing stretching exercises.  [name] Morphine equivalent is 45.00 MME.   Oral Swab was Performed today.     Pain Inventory Average Pain 4 Pain Right Now 4 My pain is constant, burning, and aching  In the last 24 hours, has pain interfered with the following? General activity 4 Relation with others 4 Enjoyment of life 4 What TIME of day is your pain at its worst? evening and night Sleep (in general) Good  Pain is worse with: walking, standing, and some activites Pain improves with: rest, heat/ice, and medication Relief from Meds: 5  Family History  Problem Relation Age of Onset   Breast cancer Mother    Diabetes Mother    Heart disease Mother    Allergies Mother    Diabetes Father    Heart disease Father    Emphysema Father    Allergies Father    Breast cancer Paternal Aunt    Colon cancer Neg Hx    Esophageal cancer Neg Hx    Rectal cancer Neg Hx    Stomach cancer Neg Hx    Social History   Socioeconomic History   Marital status: Widowed    Spouse name: Not on file   Number of children: Not on file   Years of education: Not on file   Highest education level: Not on file  Occupational History   Occupation: Radiation protection practitioner   Occupation: retired  Tobacco Use   Smoking status: Never   Smokeless tobacco: Never   Tobacco comments:    Never smoke 01/13/22  Vaping Use   Vaping Use: Never used  Substance and Sexual Activity   Alcohol use: Yes    Alcohol/week: 1.0 standard drink of alcohol    Types: 1 Glasses of wine per week    Comment: rare   Drug use: Never   Sexual activity: Not Currently  Other  Topics Concern   Not on file  Social History Narrative   Not on file   Social Determinants of Health   Financial Resource Strain: Low Risk  (06/16/2022)   Overall Financial Resource Strain (CARDIA)    Difficulty of Paying Living Expenses: Not hard at all  Food Insecurity: No Food Insecurity (06/16/2022)   Hunger Vital Sign    Worried About Running Out of Food in the Last Year: Never true    Ran Out of Food in the Last Year: Never true  Transportation Needs: No Transportation Needs (06/16/2022)   PRAPARE - Hydrologist (Medical): No    Lack of Transportation (Non-Medical): No  Physical Activity: Sufficiently Active (05/31/2021)   Exercise Vital Sign    Days of Exercise per Week: 7 days    Minutes of Exercise per Session: 30 min  Stress: No Stress Concern Present (06/16/2022)   [place]    Feeling of Stress : Not at all  Social Connections: Moderately Integrated (06/16/2022)   Social Connection and Isolation Panel [NHANES]    Frequency of Communication with Friends and Family: Three times a week  Frequency of Social Gatherings with Friends and Family: More than three times a week    Attends Religious Services: More than 4 times per year    Active Member of Clubs or Organizations: Yes    Attends Archivist Meetings: More than 4 times per year    Marital Status: Widowed   Past Surgical History:  Procedure Laterality Date   56 HOUR [place] STUDY N/A 01/04/2016   Procedure: 24 HOUR PH STUDY;  Surgeon: Manus Gunning, MD;  Location: [facility];  Service: Gastroenterology;  Laterality: N/A;   ABDOMINAL HYSTERECTOMY  1988   ADENOIDECTOMY     ANTERIOR CERVICAL DECOMP/DISCECTOMY FUSION  2009, 1993   first procedure in ~ 1993/Dr [place] N/A 12/16/2021   Procedure: ATRIAL FIBRILLATION ABLATION;  Surgeon: Vickie Epley, MD;   Location: [place] [facility];  Service: Cardiovascular;  Laterality: N/A;   BACK SURGERY     CARDIOVERSION N/A 04/03/2019   Procedure: CARDIOVERSION;  Surgeon: Dorothy Spark, MD;  Location: [facility] ENDOSCOPY;  Service: Cardiovascular;  Laterality: N/A;   CHOLECYSTECTOMY  2005   ESOPHAGEAL MANOMETRY N/A 01/04/2016   Procedure: ESOPHAGEAL MANOMETRY (EM) 24 HR PH;  Surgeon: Manus Gunning, MD;  Location: [facility];  Service: Gastroenterology;  Laterality: N/A;   ESOPHAGOGASTRODUODENOSCOPY (EGD) WITH PROPOFOL N/A 05/17/2016   Procedure: ESOPHAGOGASTRODUODENOSCOPY (EGD) WITH PROPOFOL;  Surgeon: Manus Gunning, MD;  Location: [facility];  Service: Gastroenterology;  Laterality: N/A;   NASAL SINUS SURGERY  2006   SAVORY DILATION N/A 05/17/2016   Procedure: SAVORY DILATION;  Surgeon: Manus Gunning, MD;  Location: [facility];  Service: Gastroenterology;  Laterality: N/A;   TEE WITHOUT CARDIOVERSION N/A 04/03/2019   Procedure: TRANSESOPHAGEAL ECHOCARDIOGRAM (TEE)-with DCCV;  Surgeon: Dorothy Spark, MD;  Location: [facility] ENDOSCOPY;; EF 55 to 60%.  No LVH.  Normal RV.  Moderate LA dilation.  No thrombus.  Mild RA dilation.  Mild to moderate TR.  Mild aortic valve sclerosis, no stenosis.  Moderately elevated PA pressures (47 mmHg)   TONSILLECTOMY AND ADENOIDECTOMY     TRANSTHORACIC ECHOCARDIOGRAM  2009   Normal LV size and function. EF 60-65%. No regional wall motion and amount is. Mild aortic sclerosis, no stenosis   TUBAL LIGATION  1979   Past Surgical History:  Procedure Laterality Date   66 HOUR [place] STUDY N/A 01/04/2016   Procedure: 24 HOUR PH STUDY;  Surgeon: Manus Gunning, MD;  Location: [facility];  Service: Gastroenterology;  Laterality: N/A;   ABDOMINAL HYSTERECTOMY  1988   ADENOIDECTOMY     ANTERIOR CERVICAL DECOMP/DISCECTOMY FUSION  2009, 1993   first procedure in ~ 1993/Dr [place] N/A 12/16/2021    Procedure: ATRIAL FIBRILLATION ABLATION;  Surgeon: Vickie Epley, MD;  Location: [place] [facility];  Service: Cardiovascular;  Laterality: N/A;   BACK SURGERY     CARDIOVERSION N/A 04/03/2019   Procedure: CARDIOVERSION;  Surgeon: Dorothy Spark, MD;  Location: [facility] ENDOSCOPY;  Service: Cardiovascular;  Laterality: N/A;   CHOLECYSTECTOMY  2005   ESOPHAGEAL MANOMETRY N/A 01/04/2016   Procedure: ESOPHAGEAL MANOMETRY (EM) 24 HR PH;  Surgeon: Manus Gunning, MD;  Location: [facility];  Service: Gastroenterology;  Laterality: N/A;   ESOPHAGOGASTRODUODENOSCOPY (EGD) WITH PROPOFOL N/A 05/17/2016   Procedure: ESOPHAGOGASTRODUODENOSCOPY (EGD) WITH PROPOFOL;  Surgeon: Manus Gunning, MD;  Location: [facility];  Service: Gastroenterology;  Laterality: N/A;  NASAL SINUS SURGERY  2006   SAVORY DILATION N/A 05/17/2016   Procedure: SAVORY DILATION;  Surgeon: Manus Gunning, MD;  Location: [facility];  Service: Gastroenterology;  Laterality: N/A;   TEE WITHOUT CARDIOVERSION N/A 04/03/2019   Procedure: TRANSESOPHAGEAL ECHOCARDIOGRAM (TEE)-with DCCV;  Surgeon: Dorothy Spark, MD;  Location: [facility] ENDOSCOPY;; EF 55 to 60%.  No LVH.  Normal RV.  Moderate LA dilation.  No thrombus.  Mild RA dilation.  Mild to moderate TR.  Mild aortic valve sclerosis, no stenosis.  Moderately elevated PA pressures (47 mmHg)   TONSILLECTOMY AND ADENOIDECTOMY     TRANSTHORACIC ECHOCARDIOGRAM  2009   Normal LV size and function. EF 60-65%. No regional wall motion and amount is. Mild aortic sclerosis, no stenosis   TUBAL LIGATION  1979   Past Medical History:  Diagnosis Date   B12 deficiency    Complication of anesthesia    slow to wake up    Depression    Diabetes mellitus type 2, diet-controlled ([place]) 07/24/2018   Fibromyalgia    Generalized hyperhidrosis 04/17/2014   GERD (gastroesophageal reflux disease)    History of rheumatic fever as a child    Hyperlipemia    Hypertension    denies  htn on 05/09/16   HYPOTHYROIDISM 05/29/2007   Irritable bowel syndrome    Migraines    hx of    OSA (obstructive sleep apnea) 06/26/2019   Senile calcific aortic valve sclerosis    Aortic Sclerosis-no stenosis   BP 135/73   Pulse 62   Ht '5\' 4"'$  (1.626 m)   Wt 177 lb (80.3 kg)   SpO2 98%   BMI 30.38 kg/m   Opioid Risk Score:   Fall Risk Score:  `1  Depression screen [facility] 2/9     08/04/2022   12:54 PM 07/06/2022    1:19 PM 06/16/2022    2:01 PM 05/31/2022   11:12 AM 04/06/2022    1:08 PM 03/02/2022   10:48 AM 01/21/2022    1:17 PM  Depression screen PHQ 2/9  Decreased Interest 0 0 0 0 0 0 0  Down, Depressed, Hopeless 0 0 0 0  0 0  PHQ - 2 Score 0 0 0 0 0 0 0     Review of Systems  Musculoskeletal:  Positive for back pain and neck pain.       LT knee and foot       Objective:   Physical Exam Vitals and nursing note reviewed.  Constitutional:      Appearance: Normal appearance.  Cardiovascular:     Rate and Rhythm: Normal rate and regular rhythm.     Pulses: Normal pulses.     Heart sounds: Normal heart sounds.  Pulmonary:     Effort: Pulmonary effort is normal.     Breath sounds: Normal breath sounds.  Musculoskeletal:     Cervical back: Normal range of motion and neck supple.     Comments: Normal Muscle Bulk and Muscle Testing Reveals:  Upper Extremities: Full ROM and Muscle Strength 5/5  Thoracic Paraspinal Tenderness: T-7-T-9 Lower Extremities: Full ROM and Muscle Strength 5/5 Arises from Chair slowly Narrow Based  Gait     Skin:    General: Skin is warm and dry.  Neurological:     Mental Status: She is alert and oriented to person, place, and time.  Psychiatric:        Mood and Affect: Mood normal.        Behavior: Behavior normal.  Assessment & Plan:  1. Cervical postlaminectomy syndrome:/ Cervicalgia/ Cervical Radiculitis/ with neck and shoulder pain/ Chronic Midline Thoracic Pain.  Continue exercise routine and using heat therapy.  08/29/2022 Continue : current medication regimen. Refilled: MS Contin 15 mg one tablet three times a day #90.   We will continue the opioid monitoring program, this consists of regular clinic visits, examinations, urine drug screen, pill counts as well as use of [place] Controlled Substance Reporting system. A 12 month History has been reviewed on the [place] Controlled Substance Reporting System on 08/29/2022.   2. Fibromyalgia/ Neuropathy: Continue current medication regimen with Gabapentin, activity and exercise regime. 08/29/2022. 3. Bilateral Knee Pain/ Mild Degenerative Changes L>R: S/P Left Knee Injection on 05/31/2022, with three weeks of relief noted.  Continue current medication regimen with Voltaren Gel. 08/29/2022. 4. Bilateral  Ankle Pain:  No complaints today. Continue HEP as tolerated. Continue to monitor.08/29/2022. 5. Muscle Spasm: Continue current medication regimen with  Robaxin. Continue to Monitor.08/29/2022.. 6. Opioid Induced Constipation: No complaints Continue to Monitor. 08/29/2022. 7. Midline Low Back Pain: Continue current medication regime, continue HEP as tolerated. 08/29/2022. 8. Right Gluteal Medius Weakness and Atrophy: No complaints Today:Continue with Hip Abduction Exercises as tolerated: Continue to Monitor. 08/29/2022. 9. Right Hip Pain/ Right Greater Trochanteric Tenderness: No complaints today. Continue to Alternate Ice and Heat Therapy. Continue to Monitor. 08/29/2022  10. Sacroiliac Joint Pain:  S/P Sacroiliac Injection with good relief noted. 08/29/2022 11. Lumbar Radiculitis: No complaints today.Continue Gabapentin. Continue HEP as tolerated. Continue current medication regimen. Continue to monitor.  08/29/2022.     F/U in 1 month

## 2022-08-31 ENCOUNTER — Ambulatory Visit: Payer: Medicare Other | Admitting: Registered Nurse

## 2022-09-02 LAB — DRUG TOX MONITOR 1 W/CONF, ORAL FLD

## 2022-09-02 LAB — DRUG TOX ALC METAB W/CON, ORAL FLD: Alcohol Metabolite: NEGATIVE ng/mL (ref <25)

## 2022-09-04 ENCOUNTER — Encounter: Payer: Self-pay | Admitting: Registered Nurse

## 2022-09-05 ENCOUNTER — Encounter: Payer: Self-pay | Admitting: Family Medicine

## 2022-09-07 ENCOUNTER — Other Ambulatory Visit: Payer: Medicare Other

## 2022-09-08 ENCOUNTER — Encounter: Payer: Self-pay | Admitting: *Deleted

## 2022-09-08 ENCOUNTER — Telehealth: Payer: Self-pay | Admitting: *Deleted

## 2022-09-08 NOTE — Telephone Encounter (Signed)
Swab is negative for medications but reported taken same day as test.

## 2022-09-11 NOTE — Progress Notes (Unsigned)
  Electrophysiology Office Follow up Visit Note:    Date:  09/14/2022   ID:  [name], DOB 03-Feb-1951, MRN [id number]  PCP:  Owens Loffler, MD  [facility] Cardiologist:  Glenetta Hew, MD  [facility] Electrophysiologist:  Vickie Epley, MD    Interval History:    [name] is a 72 y.o. female who presents for a follow up visit.   I last saw her 03/16/2022 for her AF. She is on Eliquis and diltiazem.  She was previously treated with flecainide.  She had an atrial fibrillation ablation on December 16, 2021 during which the veins and posterior wall were isolated.  She has been doing well without recurrence of her arrhythmia.  She is happy that her recent foot surgery went well.  She is healing well.       Past medical, surgical, social and family history were reviewed.  ROS:   Please see the history of present illness.    All other systems reviewed and are negative.  EKGs/Labs/Other Studies Reviewed:    The following studies were reviewed today:    EKG:  The ekg ordered today demonstrates sinus rhythm   Physical Exam:    VS:  BP 134/62   Pulse 67   Ht 5\' 4"  (1.626 m)   Wt 177 lb 6.4 oz (80.5 kg)   SpO2 96%   BMI 30.45 kg/m     Wt Readings from Last 3 Encounters:  09/14/22 177 lb 6.4 oz (80.5 kg)  08/29/22 177 lb (80.3 kg)  08/04/22 181 lb (82.1 kg)     GEN:  Well nourished, well developed in no acute distress CARDIAC: RRR, no murmurs, rubs, gallops RESPIRATORY:  Clear to auscultation without rales, wheezing or rhonchi       ASSESSMENT:    1. Persistent atrial fibrillation ([place])   2. Primary hypertension    PLAN:    In order of problems listed above:   #Persistent AF Maintaining sinus after her ablation.  No longer taking flecainide. Continue diltiazem On eliquis for stroke ppx.  #Hypertension At goal today.  Recommend checking blood pressures 1-2 times per week at home and recording the values.  Recommend bringing these  recordings to the primary care physician.  Follow up 12 months with APP.    Signed, [name], MD, [facility], [facility] 09/14/2022 11:34 AM    Electrophysiology [facility]

## 2022-09-14 ENCOUNTER — Encounter: Payer: Self-pay | Admitting: Cardiology

## 2022-09-14 ENCOUNTER — Ambulatory Visit: Payer: Medicare Other | Attending: Cardiology | Admitting: Cardiology

## 2022-09-14 ENCOUNTER — Encounter: Payer: Medicare Other | Admitting: Family Medicine

## 2022-09-14 VITALS — BP 134/62 | HR 67 | Ht 64.0 in | Wt 177.4 lb

## 2022-09-14 DIAGNOSIS — Z79899 Other long term (current) drug therapy: Secondary | ICD-10-CM

## 2022-09-14 DIAGNOSIS — I4819 Other persistent atrial fibrillation: Secondary | ICD-10-CM

## 2022-09-14 DIAGNOSIS — I10 Essential (primary) hypertension: Secondary | ICD-10-CM | POA: Diagnosis not present

## 2022-09-14 NOTE — Patient Instructions (Signed)
Medication Instructions:  Your physician recommends that you continue on your current medications as directed. Please refer to the Current Medication list given to you today.  *If you need a refill on your cardiac medications before your next appointment, please call your pharmacy*  Follow-Up: At [facility], you and your health needs are our priority.  As part of our continuing mission to provide you with exceptional heart care, we have created designated Provider Care Teams.  These Care Teams include your primary Cardiologist (physician) and Advanced Practice Providers (APPs -  Physician Assistants and Nurse Practitioners) who all work together to provide you with the care you need, when you need it.  Your next appointment:   1 year(s)  Provider:   You will see one of the following Advanced Practice Providers on your designated Care Team:   Renee Ursuy, PA-C Michael "Andy" Tillery, PA-C Suzann Riddle, NP  

## 2022-09-22 NOTE — Telephone Encounter (Signed)
Opened in error

## 2022-09-26 ENCOUNTER — Telehealth: Payer: Self-pay | Admitting: Physical Medicine & Rehabilitation

## 2022-09-26 NOTE — Telephone Encounter (Signed)
Patient called in requesting an appointment for a procedure that involves gel that was mentioned on visit 05/2022 .

## 2022-09-28 ENCOUNTER — Encounter: Payer: Self-pay | Admitting: Registered Nurse

## 2022-09-28 ENCOUNTER — Encounter: Payer: Medicare Other | Attending: Physical Medicine & Rehabilitation | Admitting: Registered Nurse

## 2022-09-28 ENCOUNTER — Other Ambulatory Visit: Payer: Self-pay | Admitting: Family Medicine

## 2022-09-28 VITALS — BP 137/72 | HR 68 | Ht 64.0 in | Wt 175.8 lb

## 2022-09-28 DIAGNOSIS — M546 Pain in thoracic spine: Secondary | ICD-10-CM | POA: Insufficient documentation

## 2022-09-28 DIAGNOSIS — Z79891 Long term (current) use of opiate analgesic: Secondary | ICD-10-CM | POA: Diagnosis present

## 2022-09-28 DIAGNOSIS — M1712 Unilateral primary osteoarthritis, left knee: Secondary | ICD-10-CM | POA: Insufficient documentation

## 2022-09-28 DIAGNOSIS — G894 Chronic pain syndrome: Secondary | ICD-10-CM | POA: Insufficient documentation

## 2022-09-28 DIAGNOSIS — G8929 Other chronic pain: Secondary | ICD-10-CM

## 2022-09-28 DIAGNOSIS — Z5181 Encounter for therapeutic drug level monitoring: Secondary | ICD-10-CM | POA: Diagnosis not present

## 2022-09-28 MED ORDER — MORPHINE SULFATE ER 15 MG PO TBCR: 15.0000 mg | EXTENDED_RELEASE_TABLET | Freq: Three times a day (TID) | ORAL | 0 refills | Status: DC

## 2022-09-28 NOTE — Progress Notes (Signed)
Subjective:    Patient ID: [name], female    DOB: 06-Aug-1950, 72 y.o.   MRN: [id number]  HPI: [name] is a 72 y.o. female who returns for follow up appointment for chronic pain and medication refill. She states her pain is located in her mid- back and left knee pain. She rates her pain 4. Her current exercise regime is walking short distances and performing stretching exercises.  Ms. Fromer Morphine equivalent is 45.00 MME.   Last Oral Swab was Performed on 08/29/2022.    Pain Inventory Average Pain 4 Pain Right Now 4 My pain is constant, burning, and aching  In the last 24 hours, has pain interfered with the following? General activity 5 Relation with others 5 Enjoyment of life 5 What TIME of day is your pain at its worst? evening and night Sleep (in general) Good  Pain is worse with: walking, bending, standing, and some activites Pain improves with: rest, heat/ice, and medication Relief from Meds: 5  Family History  Problem Relation Age of Onset   Breast cancer Mother    Diabetes Mother    Heart disease Mother    Allergies Mother    Diabetes Father    Heart disease Father    Emphysema Father    Allergies Father    Breast cancer Paternal Aunt    Colon cancer Neg Hx    Esophageal cancer Neg Hx    Rectal cancer Neg Hx    Stomach cancer Neg Hx    Social History   Socioeconomic History   Marital status: Widowed    Spouse name: Not on file   Number of children: Not on file   Years of education: Not on file   Highest education level: Not on file  Occupational History   Occupation: Radiation protection practitioner   Occupation: retired  Tobacco Use   Smoking status: Never   Smokeless tobacco: Never   Tobacco comments:    Never smoke 01/13/22  Vaping Use   Vaping Use: Never used  Substance and Sexual Activity   Alcohol use: Yes    Alcohol/week: 1.0 standard drink of alcohol    Types: 1 Glasses of wine per week    Comment: rare   Drug use: Never   Sexual activity:  Not Currently  Other Topics Concern   Not on file  Social History Narrative   Not on file   Social Determinants of Health   Financial Resource Strain: Low Risk  (06/16/2022)   Overall Financial Resource Strain (CARDIA)    Difficulty of Paying Living Expenses: Not hard at all  Food Insecurity: No Food Insecurity (06/16/2022)   Hunger Vital Sign    Worried About Running Out of Food in the Last Year: Never true    Ran Out of Food in the Last Year: Never true  Transportation Needs: No Transportation Needs (06/16/2022)   PRAPARE - Hydrologist (Medical): No    Lack of Transportation (Non-Medical): No  Physical Activity: Sufficiently Active (05/31/2021)   Exercise Vital Sign    Days of Exercise per Week: 7 days    Minutes of Exercise per Session: 30 min  Stress: No Stress Concern Present (06/16/2022)   [place]    Feeling of Stress : Not at all  Social Connections: Moderately Integrated (06/16/2022)   Social Connection and Isolation Panel [NHANES]    Frequency of Communication with Friends and Family: Three times a  week    Frequency of Social Gatherings with Friends and Family: More than three times a week    Attends Religious Services: More than 4 times per year    Active Member of Clubs or Organizations: Yes    Attends Archivist Meetings: More than 4 times per year    Marital Status: Widowed   Past Surgical History:  Procedure Laterality Date   42 HOUR [place] STUDY N/A 01/04/2016   Procedure: 24 HOUR PH STUDY;  Surgeon: Manus Gunning, MD;  Location: [facility];  Service: Gastroenterology;  Laterality: N/A;   ABDOMINAL HYSTERECTOMY  1988   ADENOIDECTOMY     ANTERIOR CERVICAL DECOMP/DISCECTOMY FUSION  2009, 1993   first procedure in ~ 1993/Dr Douglas N/A 12/16/2021   Procedure: ATRIAL FIBRILLATION ABLATION;  Surgeon:  Vickie Epley, MD;  Location: [place] [facility];  Service: Cardiovascular;  Laterality: N/A;   BACK SURGERY     CARDIOVERSION N/A 04/03/2019   Procedure: CARDIOVERSION;  Surgeon: Dorothy Spark, MD;  Location: [facility] ENDOSCOPY;  Service: Cardiovascular;  Laterality: N/A;   CHOLECYSTECTOMY  2005   ESOPHAGEAL MANOMETRY N/A 01/04/2016   Procedure: ESOPHAGEAL MANOMETRY (EM) 24 HR PH;  Surgeon: Manus Gunning, MD;  Location: [facility];  Service: Gastroenterology;  Laterality: N/A;   ESOPHAGOGASTRODUODENOSCOPY (EGD) WITH PROPOFOL N/A 05/17/2016   Procedure: ESOPHAGOGASTRODUODENOSCOPY (EGD) WITH PROPOFOL;  Surgeon: Manus Gunning, MD;  Location: [facility];  Service: Gastroenterology;  Laterality: N/A;   NASAL SINUS SURGERY  2006   SAVORY DILATION N/A 05/17/2016   Procedure: SAVORY DILATION;  Surgeon: Manus Gunning, MD;  Location: [facility];  Service: Gastroenterology;  Laterality: N/A;   TEE WITHOUT CARDIOVERSION N/A 04/03/2019   Procedure: TRANSESOPHAGEAL ECHOCARDIOGRAM (TEE)-with DCCV;  Surgeon: Dorothy Spark, MD;  Location: [facility] ENDOSCOPY;; EF 55 to 60%.  No LVH.  Normal RV.  Moderate LA dilation.  No thrombus.  Mild RA dilation.  Mild to moderate TR.  Mild aortic valve sclerosis, no stenosis.  Moderately elevated PA pressures (47 mmHg)   TONSILLECTOMY AND ADENOIDECTOMY     TRANSTHORACIC ECHOCARDIOGRAM  2009   Normal LV size and function. EF 60-65%. No regional wall motion and amount is. Mild aortic sclerosis, no stenosis   TUBAL LIGATION  1979   Past Surgical History:  Procedure Laterality Date   6 HOUR [place] STUDY N/A 01/04/2016   Procedure: 24 HOUR PH STUDY;  Surgeon: Manus Gunning, MD;  Location: [facility];  Service: Gastroenterology;  Laterality: N/A;   ABDOMINAL HYSTERECTOMY  1988   ADENOIDECTOMY     ANTERIOR CERVICAL DECOMP/DISCECTOMY FUSION  2009, 1993   first procedure in ~ 1993/Dr [place] N/A 12/16/2021   Procedure: ATRIAL FIBRILLATION ABLATION;  Surgeon: Vickie Epley, MD;  Location: [place] [facility];  Service: Cardiovascular;  Laterality: N/A;   BACK SURGERY     CARDIOVERSION N/A 04/03/2019   Procedure: CARDIOVERSION;  Surgeon: Dorothy Spark, MD;  Location: [facility] ENDOSCOPY;  Service: Cardiovascular;  Laterality: N/A;   CHOLECYSTECTOMY  2005   ESOPHAGEAL MANOMETRY N/A 01/04/2016   Procedure: ESOPHAGEAL MANOMETRY (EM) 24 HR PH;  Surgeon: Manus Gunning, MD;  Location: [facility];  Service: Gastroenterology;  Laterality: N/A;   ESOPHAGOGASTRODUODENOSCOPY (EGD) WITH PROPOFOL N/A 05/17/2016   Procedure: ESOPHAGOGASTRODUODENOSCOPY (EGD) WITH PROPOFOL;  Surgeon: Manus Gunning, MD;  Location: [facility];  Service:  Gastroenterology;  Laterality: N/A;   NASAL SINUS SURGERY  2006   SAVORY DILATION N/A 05/17/2016   Procedure: SAVORY DILATION;  Surgeon: Manus Gunning, MD;  Location: [facility];  Service: Gastroenterology;  Laterality: N/A;   TEE WITHOUT CARDIOVERSION N/A 04/03/2019   Procedure: TRANSESOPHAGEAL ECHOCARDIOGRAM (TEE)-with DCCV;  Surgeon: Dorothy Spark, MD;  Location: [facility] ENDOSCOPY;; EF 55 to 60%.  No LVH.  Normal RV.  Moderate LA dilation.  No thrombus.  Mild RA dilation.  Mild to moderate TR.  Mild aortic valve sclerosis, no stenosis.  Moderately elevated PA pressures (47 mmHg)   TONSILLECTOMY AND ADENOIDECTOMY     TRANSTHORACIC ECHOCARDIOGRAM  2009   Normal LV size and function. EF 60-65%. No regional wall motion and amount is. Mild aortic sclerosis, no stenosis   TUBAL LIGATION  1979   Past Medical History:  Diagnosis Date   B12 deficiency    Complication of anesthesia    slow to wake up    Depression    Diabetes mellitus type 2, diet-controlled 07/24/2018   Fibromyalgia    Generalized hyperhidrosis 04/17/2014   GERD (gastroesophageal reflux disease)    History of rheumatic fever as a child    Hyperlipemia     Hypertension    denies htn on 05/09/16   HYPOTHYROIDISM 05/29/2007   Irritable bowel syndrome    Migraines    hx of    OSA (obstructive sleep apnea) 06/26/2019   Senile calcific aortic valve sclerosis    Aortic Sclerosis-no stenosis   BP 137/72   Pulse 68   Ht 5\' 4"  (1.626 m)   Wt 175 lb 12.8 oz (79.7 kg)   SpO2 98%   BMI 30.18 kg/m   Opioid Risk Score:   Fall Risk Score:  `1  Depression screen PHQ 2/9     09/28/2022    2:07 PM 08/29/2022    1:54 PM 08/04/2022   12:54 PM 07/06/2022    1:19 PM 06/16/2022    2:01 PM 05/31/2022   11:12 AM 04/06/2022    1:08 PM  Depression screen PHQ 2/9  Decreased Interest 0 0 0 0 0 0 0  Down, Depressed, Hopeless 0 0 0 0 0 0   PHQ - 2 Score 0 0 0 0 0 0 0     Review of Systems  Constitutional: Negative.   HENT: Negative.    Eyes: Negative.   Respiratory: Negative.    Cardiovascular: Negative.   Gastrointestinal: Negative.   Endocrine: Negative.   Genitourinary: Negative.   Musculoskeletal:  Positive for back pain and gait problem.  Skin: Negative.   Allergic/Immunologic: Negative.   Hematological: Negative.   Psychiatric/Behavioral: Negative.    All other systems reviewed and are negative.      Objective:   Physical Exam Vitals and nursing note reviewed.  Constitutional:      Appearance: Normal appearance.  Cardiovascular:     Rate and Rhythm: Normal rate and regular rhythm.     Pulses: Normal pulses.     Heart sounds: Normal heart sounds.  Pulmonary:     Effort: Pulmonary effort is normal.     Breath sounds: Normal breath sounds.  Musculoskeletal:     Cervical back: Normal range of motion and neck supple.     Comments: Normal Muscle Bulk and Muscle Testing Reveals:  Upper Extremities: Full ROM and Muscle Strength 5/5  Thoracic Paraspinal Tenderness: T-3-T-7 Lower Extremities: Full ROM and Muscle Strength 5/5 Arises from Table with Ease Narrow Based  Gait     Skin:    General: Skin is warm and dry.  Neurological:      Mental Status: She is alert and oriented to person, place, and time.  Psychiatric:        Mood and Affect: Mood normal.        Behavior: Behavior normal.         Assessment & Plan:  1. Cervical postlaminectomy syndrome:/ Cervicalgia/ Cervical Radiculitis/ with neck and shoulder pain/ Chronic Midline Thoracic Pain.  Continue exercise routine and using heat therapy. 09/28/2022 Continue : current medication regimen. Refilled: MS Contin 15 mg one tablet three times a day #90.   We will continue the opioid monitoring program, this consists of regular clinic visits, examinations, urine drug screen, pill counts as well as use of [place] Controlled Substance Reporting system. A 12 month History has been reviewed on the [place] Controlled Substance Reporting System on 09/28/2022.   2. Fibromyalgia/ Neuropathy: Continue current medication regimen with Gabapentin, activity and exercise regime. 09/28/2022. 3. Bilateral Knee Pain/ Mild Degenerative Changes L>R: Scheduled for  Left Knee Injection with Dr Letta Pate.  Continue current medication regimen with Voltaren Gel. 09/28/2022. 4. Bilateral  Ankle Pain:  No complaints today. Continue HEP as tolerated. Continue to monitor.09/28/2022. 5. Muscle Spasm: Continue current medication regimen with  Robaxin. Continue to Monitor.09/28/2022.. 6. Opioid Induced Constipation: No complaints Continue to Monitor. 09/28/2022. 7. Midline Low Back Pain: Continue current medication regime, continue HEP as tolerated. 09/28/2022. 8. Right Gluteal Medius Weakness and Atrophy: No complaints Today:Continue with Hip Abduction Exercises as tolerated: Continue to Monitor. 09/28/2022. 9. Right Hip Pain/ Right Greater Trochanteric Tenderness: No complaints today. Continue to Alternate Ice and Heat Therapy. Continue to Monitor. 09/28/2022  10. Sacroiliac Joint Pain:  S/P Sacroiliac Injection with good relief noted. 09/28/2022 11. Lumbar Radiculitis: No complaints  today.Continue Gabapentin. Continue HEP as tolerated. Continue current medication regimen. Continue to monitor.  08/29/2022.     F/U in 1 month

## 2022-10-03 ENCOUNTER — Other Ambulatory Visit (INDEPENDENT_AMBULATORY_CARE_PROVIDER_SITE_OTHER): Payer: Medicare Other

## 2022-10-03 DIAGNOSIS — E559 Vitamin D deficiency, unspecified: Secondary | ICD-10-CM | POA: Diagnosis not present

## 2022-10-03 DIAGNOSIS — E119 Type 2 diabetes mellitus without complications: Secondary | ICD-10-CM | POA: Diagnosis not present

## 2022-10-03 DIAGNOSIS — E785 Hyperlipidemia, unspecified: Secondary | ICD-10-CM | POA: Diagnosis not present

## 2022-10-03 DIAGNOSIS — E038 Other specified hypothyroidism: Secondary | ICD-10-CM

## 2022-10-03 DIAGNOSIS — E538 Deficiency of other specified B group vitamins: Secondary | ICD-10-CM | POA: Diagnosis not present

## 2022-10-03 DIAGNOSIS — Z79899 Other long term (current) drug therapy: Secondary | ICD-10-CM

## 2022-10-03 LAB — HEPATIC FUNCTION PANEL
ALT: 18 U/L (ref 0–35)
AST: 20 U/L (ref 0–37)
Albumin: 4.3 g/dL (ref 3.5–5.2)
Alkaline Phosphatase: 108 U/L (ref 39–117)
Bilirubin, Direct: 0.1 mg/dL (ref 0.0–0.3)
Total Bilirubin: 0.5 mg/dL (ref 0.2–1.2)
Total Protein: 7 g/dL (ref 6.0–8.3)

## 2022-10-03 LAB — T3, FREE: T3, Free: 3.7 pg/mL (ref 2.3–4.2)

## 2022-10-03 LAB — LIPID PANEL
Cholesterol: 161 mg/dL (ref 0–200)
HDL: 59.2 mg/dL (ref >39.00)
LDL Cholesterol: 82 mg/dL (ref 0–99)
NonHDL: 102.18
Total CHOL/HDL Ratio: 3
Triglycerides: 101 mg/dL (ref 0.0–149.0)
VLDL: 20.2 mg/dL (ref 0.0–40.0)

## 2022-10-03 LAB — BASIC METABOLIC PANEL
BUN: 14 mg/dL (ref 6–23)
CO2: 30 mEq/L (ref 19–32)
Calcium: 9.7 mg/dL (ref 8.4–10.5)
Chloride: 103 mEq/L (ref 96–112)
Creatinine, Ser: 0.65 mg/dL (ref 0.40–1.20)
GFR: 88.89 mL/min (ref >60.00)
Glucose, Bld: 98 mg/dL (ref 70–99)
Potassium: 4 mEq/L (ref 3.5–5.1)
Sodium: 141 mEq/L (ref 135–145)

## 2022-10-03 LAB — CBC WITH DIFFERENTIAL/PLATELET
Basophils Absolute: 0 10*3/uL (ref 0.0–0.1)
Basophils Relative: 0.4 % (ref 0.0–3.0)
Eosinophils Absolute: 0.1 10*3/uL (ref 0.0–0.7)
Eosinophils Relative: 2.7 % (ref 0.0–5.0)
HCT: 36 % (ref 36.0–46.0)
Hemoglobin: 12.5 g/dL (ref 12.0–15.0)
Lymphocytes Relative: 32.2 % (ref 12.0–46.0)
Lymphs Abs: 1.4 10*3/uL (ref 0.7–4.0)
MCHC: 34.8 g/dL (ref 30.0–36.0)
MCV: 90.2 fl (ref 78.0–100.0)
Monocytes Absolute: 0.3 10*3/uL (ref 0.1–1.0)
Monocytes Relative: 6.5 % (ref 3.0–12.0)
Neutro Abs: 2.6 10*3/uL (ref 1.4–7.7)
Neutrophils Relative %: 58.2 % (ref 43.0–77.0)
Platelets: 281 10*3/uL (ref 150.0–400.0)
RBC: 3.99 Mil/uL (ref 3.87–5.11)
RDW: 12.6 % (ref 11.5–15.5)
WBC: 4.5 10*3/uL (ref 4.0–10.5)

## 2022-10-03 LAB — MICROALBUMIN / CREATININE URINE RATIO
Creatinine,U: 184.7 mg/dL
Microalb Creat Ratio: 1.5 mg/g (ref 0.0–30.0)
Microalb, Ur: 2.8 mg/dL — ABNORMAL HIGH (ref 0.0–1.9)

## 2022-10-03 LAB — VITAMIN B12: Vitamin B-12: 397 pg/mL (ref 211–911)

## 2022-10-03 LAB — TSH: TSH: 2.39 u[IU]/mL (ref 0.35–5.50)

## 2022-10-03 LAB — HEMOGLOBIN A1C: Hgb A1c MFr Bld: 6.1 % (ref 4.6–6.5)

## 2022-10-03 LAB — T4, FREE: Free T4: 0.92 ng/dL (ref 0.60–1.60)

## 2022-10-03 LAB — VITAMIN D 25 HYDROXY (VIT D DEFICIENCY, FRACTURES): VITD: 46.18 ng/mL (ref 30.00–100.00)

## 2022-10-09 NOTE — Progress Notes (Unsigned)
[name] T. [name], MD, CAQ Sports Medicine [facility] at [facility] [address] [place], [address]  Phone: [phone]  FAX: [phone]  [name] - 72 y.o. female  MRN [id number]  Date of Birth: 21-Aug-1950  Date: 10/10/2022  PCP: Hannah Beat, MD  Referral: Hannah Beat, MD  No chief complaint on file.  Patient Care Team: Hannah Beat, MD as PCP - General Marykay Lex, MD as PCP - Cardiology (Cardiology) Lanier Prude, MD as PCP - Electrophysiology (Cardiology) Wynn Banker Victorino Sparrow, MD as Consulting Physician (Physical Medicine and Rehabilitation) Armbruster, Willaim Rayas, MD as Consulting Physician (Gastroenterology) Marykay Lex, MD as Consulting Physician (Cardiology) Coralyn Helling, MD as Consulting Physician (Pulmonary Disease) Kathyrn Sheriff, [facility] as Pharmacist (Pharmacist) Subjective:   [name] is a 72 y.o. pleasant patient who presents with the following:  Health Maintenance Summary Reviewed and updated, unless pt declines services.  Tobacco History Reviewed. Non-smoker Alcohol: No concerns, no excessive use Exercise Habits: Some activity, rec at least 30 mins 5 times a week STD concerns: none Drug Use: None Lumps or breast concerns: no  Shingrix Foot exam Covid booster Cologuard  She does have multiple medical problems including hypertension, hyperlipidemia, hypothyroidism.  She does also have chronic pain and fibromyalgia, she follows up with physical medicine and rehabilitation for this.  She is on some chronic opioid management.  She also does have A-fib and is chronically on Eliquis.  Health Maintenance  Topic Date Due   Zoster Vaccines- Shingrix (1 of 2) Never done   FOOT EXAM  11/30/2018   COVID-19 Vaccine (3 - Pfizer risk series) 10/16/2019   Fecal DNA (Cologuard)  08/05/2021   INFLUENZA VACCINE  01/26/2023   HEMOGLOBIN A1C  04/04/2023   OPHTHALMOLOGY EXAM  04/05/2023    Medicare Annual Wellness (AWV)  06/17/2023   Diabetic kidney evaluation - eGFR measurement  10/03/2023   Diabetic kidney evaluation - Urine ACR  10/03/2023   DTaP/Tdap/Td (3 - Tdap) 10/17/2023   MAMMOGRAM  01/28/2024   Pneumonia Vaccine 39+ Years old  Completed   DEXA SCAN  Completed   Hepatitis C Screening  Completed   HPV VACCINES  Aged Out    Immunization History  Administered Date(s) Administered   PFIZER(Purple Top)SARS-COV-2 Vaccination 08/22/2019, 09/18/2019   Pneumococcal Conjugate-13 01/02/2017   Pneumococcal Polysaccharide-23 05/06/2013, 07/25/2018   Td 06/28/2003, 10/16/2013   Zoster, Live 11/07/2012   Patient Active Problem List   Diagnosis Date Noted   Postlaminectomy syndrome, cervical region 08/29/2011    Priority: High   FIBROMYALGIA 01/11/2007    Priority: High   Depression, major, recurrent, in partial remission (HCC) 09/22/2008    Priority: Medium    Hypothyroidism 05/29/2007    Priority: Medium    Hyperlipidemia with target LDL less than 100 05/29/2007    Priority: Medium    Essential hypertension 11/13/2006    Priority: Medium    Chronic right sacroiliac pain 04/29/2021   Secondary hypercoagulable state 08/29/2019   OSA (obstructive sleep apnea) 06/26/2019   Paroxysmal atrial fibrillation (HCC): CHA2D2DSVasc =4; Eliquis 05/10/2019   Diabetes mellitus type 2, diet-controlled 07/24/2018   Obesity hypoventilation syndrome 03/26/2015   Generalized hyperhidrosis 04/17/2014   Obesity (BMI 30-39.9) 08/23/2013   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 08/29/2011   ALLERGIC RHINITIS 09/22/2008   Vitamin D deficiency 01/11/2007   Vitamin B 12 deficiency 11/13/2006   GERD 11/13/2006    Past Medical History:  Diagnosis Date  B12 deficiency    Complication of anesthesia    slow to wake up    Depression    Diabetes mellitus type 2, diet-controlled 07/24/2018   Fibromyalgia    Generalized hyperhidrosis 04/17/2014   GERD  (gastroesophageal reflux disease)    History of rheumatic fever as a child    Hyperlipemia    Hypertension    denies htn on 05/09/16   HYPOTHYROIDISM 05/29/2007   Irritable bowel syndrome    Migraines    hx of    OSA (obstructive sleep apnea) 06/26/2019   Senile calcific aortic valve sclerosis    Aortic Sclerosis-no stenosis    Past Surgical History:  Procedure Laterality Date   68 HOUR PH STUDY N/A 01/04/2016   Procedure: 24 HOUR PH STUDY;  Surgeon: Ruffin Frederick, MD;  Location: [facility];  Service: Gastroenterology;  Laterality: N/A;   ABDOMINAL HYSTERECTOMY  1988   ADENOIDECTOMY     ANTERIOR CERVICAL DECOMP/DISCECTOMY FUSION  2009, 1993   first procedure in ~ 1993/Dr Botero   APPENDECTOMY  1988   ATRIAL FIBRILLATION ABLATION N/A 12/16/2021   Procedure: ATRIAL FIBRILLATION ABLATION;  Surgeon: Lanier Prude, MD;  Location: [facility] INVASIVE CV LAB;  Service: Cardiovascular;  Laterality: N/A;   BACK SURGERY     CARDIOVERSION N/A 04/03/2019   Procedure: CARDIOVERSION;  Surgeon: Lars Masson, MD;  Location: [facility] ENDOSCOPY;  Service: Cardiovascular;  Laterality: N/A;   CHOLECYSTECTOMY  2005   ESOPHAGEAL MANOMETRY N/A 01/04/2016   Procedure: ESOPHAGEAL MANOMETRY (EM) 24 HR PH;  Surgeon: Ruffin Frederick, MD;  Location: [facility] ENDOSCOPY;  Service: Gastroenterology;  Laterality: N/A;   ESOPHAGOGASTRODUODENOSCOPY (EGD) WITH PROPOFOL N/A 05/17/2016   Procedure: ESOPHAGOGASTRODUODENOSCOPY (EGD) WITH PROPOFOL;  Surgeon: Ruffin Frederick, MD;  Location: [facility] ENDOSCOPY;  Service: Gastroenterology;  Laterality: N/A;   NASAL SINUS SURGERY  2006   SAVORY DILATION N/A 05/17/2016   Procedure: SAVORY DILATION;  Surgeon: Ruffin Frederick, MD;  Location: [facility] ENDOSCOPY;  Service: Gastroenterology;  Laterality: N/A;   TEE WITHOUT CARDIOVERSION N/A 04/03/2019   Procedure: TRANSESOPHAGEAL ECHOCARDIOGRAM (TEE)-with DCCV;  Surgeon: Lars Masson, MD;  Location: [facility] ENDOSCOPY;; EF  55 to 60%.  No LVH.  Normal RV.  Moderate LA dilation.  No thrombus.  Mild RA dilation.  Mild to moderate TR.  Mild aortic valve sclerosis, no stenosis.  Moderately elevated PA pressures (47 mmHg)   TONSILLECTOMY AND ADENOIDECTOMY     TRANSTHORACIC ECHOCARDIOGRAM  2009   Normal LV size and function. EF 60-65%. No regional wall motion and amount is. Mild aortic sclerosis, no stenosis   TUBAL LIGATION  1979    Family History  Problem Relation Age of Onset   Breast cancer Mother    Diabetes Mother    Heart disease Mother    Allergies Mother    Diabetes Father    Heart disease Father    Emphysema Father    Allergies Father    Breast cancer Paternal Aunt    Colon cancer Neg Hx    Esophageal cancer Neg Hx    Rectal cancer Neg Hx    Stomach cancer Neg Hx     Social History   Social History Narrative   Not on file    Past Medical History, Surgical History, Social History, Family History, Problem List, Medications, and Allergies have been reviewed and updated if relevant.  Review of Systems: Pertinent positives are listed above.  Otherwise, a full 14 point review of systems has been done in  full and it is negative except where it is noted positive.  Objective:   There were no vitals taken for this visit. Ideal Body Weight:   No results found.    09/28/2022    2:07 PM 08/29/2022    1:54 PM 08/04/2022   12:54 PM 07/06/2022    1:19 PM 06/16/2022    2:01 PM  Depression screen PHQ 2/9  Decreased Interest 0 0 0 0 0  Down, Depressed, Hopeless 0 0 0 0 0  PHQ - 2 Score 0 0 0 0 0     GEN: well developed, well nourished, no acute distress Eyes: conjunctiva and lids normal, PERRLA, EOMI ENT: TM clear, nares clear, oral exam WNL Neck: supple, no lymphadenopathy, no thyromegaly, no JVD Pulm: clear to auscultation and percussion, respiratory effort normal CV: regular rate and rhythm, S1-S2, no murmur, rub or gallop, no bruits Chest: no scars, masses, no lumps BREAST: breast exam  declined GI: soft, non-tender; no hepatosplenomegaly, masses; active bowel sounds all quadrants GU: GU exam declined Lymph: no cervical, axillary or inguinal adenopathy MSK: gait normal, muscle tone and strength WNL, no joint swelling, effusions, discoloration, crepitus  SKIN: clear, good turgor, color WNL, no rashes, lesions, or ulcerations Neuro: normal mental status, normal strength, sensation, and motion Psych: alert; oriented to person, place and time, normally interactive and not anxious or depressed in appearance.   All labs reviewed with patient. Results for orders placed or performed in visit on 10/03/22  T4, free  Result Value Ref Range   Free T4 0.92 0.60 - 1.60 ng/dL  T3, free  Result Value Ref Range   T3, Free 3.7 2.3 - 4.2 pg/mL  TSH  Result Value Ref Range   TSH 2.39 0.35 - 5.50 uIU/mL  Microalbumin / creatinine urine ratio  Result Value Ref Range   Microalb, Ur 2.8 (H) 0.0 - 1.9 mg/dL   Creatinine,U 478.2 mg/dL   Microalb Creat Ratio 1.5 0.0 - 30.0 mg/g  Hemoglobin A1c  Result Value Ref Range   Hgb A1c MFr Bld 6.1 4.6 - 6.5 %  Lipid panel  Result Value Ref Range   Cholesterol 161 0 - 200 mg/dL   Triglycerides 956.2 0.0 - 149.0 mg/dL   HDL 13.08 >65.78 mg/dL   VLDL 46.9 0.0 - 62.9 mg/dL   LDL Cholesterol 82 0 - 99 mg/dL   Total CHOL/HDL Ratio 3    NonHDL 102.18   VITAMIN D 25 Hydroxy (Vit-D Deficiency, Fractures)  Result Value Ref Range   VITD 46.18 30.00 - 100.00 ng/mL  Vitamin B12  Result Value Ref Range   Vitamin B-12 397 211 - 911 pg/mL  Hepatic function panel  Result Value Ref Range   Total Bilirubin 0.5 0.2 - 1.2 mg/dL   Bilirubin, Direct 0.1 0.0 - 0.3 mg/dL   Alkaline Phosphatase 108 39 - 117 U/L   AST 20 0 - 37 U/L   ALT 18 0 - 35 U/L   Total Protein 7.0 6.0 - 8.3 g/dL   Albumin 4.3 3.5 - 5.2 g/dL  CBC with Differential/Platelet  Result Value Ref Range   WBC 4.5 4.0 - 10.5 K/uL   RBC 3.99 3.87 - 5.11 Mil/uL   Hemoglobin 12.5 12.0 - 15.0  g/dL   HCT 52.8 41.3 - 24.4 %   MCV 90.2 78.0 - 100.0 fl   MCHC 34.8 30.0 - 36.0 g/dL   RDW 01.0 27.2 - 53.6 %   Platelets 281.0 150.0 - 400.0 K/uL  Neutrophils Relative % 58.2 43.0 - 77.0 %   Lymphocytes Relative 32.2 12.0 - 46.0 %   Monocytes Relative 6.5 3.0 - 12.0 %   Eosinophils Relative 2.7 0.0 - 5.0 %   Basophils Relative 0.4 0.0 - 3.0 %   Neutro Abs 2.6 1.4 - 7.7 K/uL   Lymphs Abs 1.4 0.7 - 4.0 K/uL   Monocytes Absolute 0.3 0.1 - 1.0 K/uL   Eosinophils Absolute 0.1 0.0 - 0.7 K/uL   Basophils Absolute 0.0 0.0 - 0.1 K/uL  Basic metabolic panel  Result Value Ref Range   Sodium 141 135 - 145 mEq/L   Potassium 4.0 3.5 - 5.1 mEq/L   Chloride 103 96 - 112 mEq/L   CO2 30 19 - 32 mEq/L   Glucose, Bld 98 70 - 99 mg/dL   BUN 14 6 - 23 mg/dL   Creatinine, Ser 9.52 0.40 - 1.20 mg/dL   GFR 84.13 >24.40 mL/min   Calcium 9.7 8.4 - 10.5 mg/dL   *Note: Due to a large number of results and/or encounters for the requested time period, some results have not been displayed. A complete set of results can be found in Results Review.   No results found.  Assessment and Plan:     ICD-10-CM   1. Healthcare maintenance  Z00.00     2. Diabetes mellitus type 2, diet-controlled  E11.9     3. Other specified hypothyroidism  E03.8       Health Maintenance Exam: The patient's preventative maintenance and recommended screening tests for an annual wellness exam were reviewed in full today. Brought up to date unless services declined.  Counselled on the importance of diet, exercise, and its role in overall health and mortality. The patient's FH and SH was reviewed, including their home life, tobacco status, and drug and alcohol status.  Follow-up in 1 year for physical exam or additional follow-up below.  Disposition: No follow-ups on file.  Future Appointments  Date Time Provider Department Center  10/10/2022  2:00 PM Hannah Beat, MD [facility]  10/25/2022  1:15 PM Kirsteins, Victorino Sparrow, MD [facility]  12/01/2022  1:00 PM Jones Bales, NP [facility]    No orders of the defined types were placed in this encounter.  There are no discontinued medications. No orders of the defined types were placed in this encounter.   Signed,  Elpidio Galea. Angeligue Bowne, MD   Allergies as of 10/10/2022       Reactions   Cyclobenzaprine Hcl Anaphylaxis, Swelling   Reaction - throat closed, tongue swelling   Rizatriptan Benzoate Anaphylaxis   Throat and Tongue Swelling Closed   Sumatriptan Swelling   Throat closed & Tongue Swelling   Tizanidine Shortness Of Breath   Aleve [naproxen Sodium] Hives   Aspartame Other (See Comments)   Clarithromycin Hives   Corn-containing Products Other (See Comments)   Head congestion   Doxycycline Diarrhea, Nausea And Vomiting   Duloxetine Nausea Only   Egg [egg-derived Products] Diarrhea, Nausea And Vomiting   Keflex [cephalexin] Itching, Other (See Comments)   Sores   Metronidazole Diarrhea, Nausea Only   Monosodium Glutamate Diarrhea, Other (See Comments)   Oxycodone Hcl Diarrhea, Nausea And Vomiting   Oxycontin [oxycodone Hcl] Nausea And Vomiting   Penicillins Other (See Comments)   Unknown reaction Did it involve swelling of the face/tongue/throat, SOB, or low BP? Unknown Did it involve sudden or severe rash/hives, skin peeling, or any reaction on the inside of your mouth or  nose? Unknown Did you need to seek medical attention at a hospital or doctor's office? Unknown When did it last happen?   infant   If all above answers are "NO", may proceed with cephalosporin use.   Rosuvastatin Other (See Comments)   Muscle aches   Venlafaxine Hives   Voltaren [diclofenac Sodium] Hives   Reaction to oral med, uses gel occasionally   Septra [sulfamethoxazole-trimethoprim] Itching, Rash   Tramadol Diarrhea, Nausea And Vomiting, Palpitations        Medication List        Accurate as of October 09, 2022  8:20 AM. If you have any questions,  ask your nurse or doctor.          acetaminophen 650 MG CR tablet Commonly known as: TYLENOL Take 650 mg by mouth in the morning and at bedtime.   apixaban 5 MG Tabs tablet Commonly known as: Eliquis Take 1 tablet (5 mg total) by mouth 2 (two) times daily.   Benefiber Chew Chew 2 tablets by mouth 2 (two) times daily as needed (regularity).   cetirizine 10 MG tablet Commonly known as: ZYRTEC Take 10 mg by mouth at bedtime.   clindamycin 1 % lotion Commonly known as: CLEOCIN T Apply 1 application topically daily as needed (eczema).   Coenzyme Q-10 100 MG capsule Take 100 mg by mouth every morning.   diclofenac sodium 1 % Gel Commonly known as: VOLTAREN Apply 4 g topically 4 (four) times daily as needed (joint pain).   diltiazem 240 MG 24 hr capsule Commonly known as: CARDIZEM CD TAKE 1 CAPSULE BY MOUTH EVERY DAY   diltiazem 30 MG tablet Commonly known as: Cardizem Take one tablet by mouth every 4 hours as needed for heart rate greater than 100, top number blood pressure needs to be above 100   docusate sodium 250 MG capsule Commonly known as: COLACE Take 250 mg by mouth at bedtime.   fluocinonide 0.05 % external solution Commonly known as: LIDEX Apply 1 application topically daily as needed (eczema on scalp).   gabapentin 300 MG capsule Commonly known as: NEURONTIN TAKE 1 CAPSULE BY MOUTH 3  TIMES DAILY   hydroxypropyl methylcellulose / hypromellose 2.5 % ophthalmic solution Commonly known as: ISOPTO TEARS / GONIOVISC Place 1-2 drops into both eyes 3 (three) times daily as needed (dry/irritated eyes.).   levothyroxine 50 MCG tablet Commonly known as: SYNTHROID TAKE 1 TABLET BY MOUTH DAILY   loratadine 10 MG tablet Commonly known as: CLARITIN Take 10 mg by mouth every morning.   MAG GLYCINATE PO Take 400 mg by mouth at bedtime.   methocarbamol 500 MG tablet Commonly known as: ROBAXIN TAKE 1 TABLET BY MOUTH EVERY 8 HOURS AS NEEDED FOR MUSCLE SPASM    morphine 15 MG 12 hr tablet Commonly known as: MS Contin Take 1 tablet (15 mg total) by mouth 3 (three) times daily.   oxybutynin 10 MG 24 hr tablet Commonly known as: DITROPAN-XL TAKE 1 TABLET BY MOUTH AT  BEDTIME   oxymetazoline 0.05 % nasal spray Commonly known as: AFRIN Place 1 spray into both nostrils 2 (two) times daily as needed for congestion.   RABEprazole 20 MG tablet Commonly known as: ACIPHEX Take 1 tablet (20 mg total) by mouth every evening.   Resveratrol 100 MG Caps Take 100 mg by mouth in the morning and at bedtime.   tretinoin 0.05 % cream Commonly known as: RETIN-A Apply 1 application  topically daily as needed (acne).   Vitamin D  125 MCG (5000 UT) Caps Take 5,000 Units by mouth every morning.

## 2022-10-10 ENCOUNTER — Ambulatory Visit (INDEPENDENT_AMBULATORY_CARE_PROVIDER_SITE_OTHER): Payer: Medicare Other | Admitting: Family Medicine

## 2022-10-10 ENCOUNTER — Encounter: Payer: Self-pay | Admitting: Family Medicine

## 2022-10-10 VITALS — BP 120/66 | HR 70 | Temp 98.0°F | Ht 64.0 in | Wt 175.5 lb

## 2022-10-10 DIAGNOSIS — E119 Type 2 diabetes mellitus without complications: Secondary | ICD-10-CM | POA: Diagnosis not present

## 2022-10-10 DIAGNOSIS — E038 Other specified hypothyroidism: Secondary | ICD-10-CM | POA: Diagnosis not present

## 2022-10-10 DIAGNOSIS — Z0000 Encounter for general adult medical examination without abnormal findings: Secondary | ICD-10-CM

## 2022-10-25 ENCOUNTER — Encounter: Payer: Self-pay | Admitting: Physical Medicine & Rehabilitation

## 2022-10-25 ENCOUNTER — Encounter (HOSPITAL_BASED_OUTPATIENT_CLINIC_OR_DEPARTMENT_OTHER): Payer: Medicare Other | Admitting: Physical Medicine & Rehabilitation

## 2022-10-25 VITALS — Ht 64.0 in | Wt 176.0 lb

## 2022-10-25 DIAGNOSIS — M546 Pain in thoracic spine: Secondary | ICD-10-CM | POA: Diagnosis not present

## 2022-10-25 DIAGNOSIS — M1712 Unilateral primary osteoarthritis, left knee: Secondary | ICD-10-CM

## 2022-10-25 MED ORDER — HYLAN G-F 20 16 MG/2ML IX SOSY
16.0000 mg | PREFILLED_SYRINGE | Freq: Once | INTRA_ARTICULAR | Status: AC
  Administered 2022-10-25: 16 mg via INTRA_ARTICULAR

## 2022-10-25 MED ORDER — MORPHINE SULFATE ER 15 MG PO TBCR: 15.0000 mg | EXTENDED_RELEASE_TABLET | Freq: Three times a day (TID) | ORAL | 0 refills | Status: DC

## 2022-10-25 NOTE — Progress Notes (Signed)
Knee injection   Indication:Left Knee pain not relieved by medication management and other conservative care.  Informed consent was obtained after describing risks and benefits of the procedure with the patient, this includes bleeding, bruising, infection and medication side effects. The patient wishes to proceed and has given written consent. The patient was placed in a recumbent position. The superomedial aspect of the knee was marked and prepped with Betadine and alcohol. It was then entered with a 21g 2"needle was inserted into the knee joint. After negative draw back for blood, a solution containing of Synvisc 1 was injected. The patient tolerated the procedure well. Post procedure instructions were given.

## 2022-10-31 ENCOUNTER — Telehealth: Payer: Self-pay | Admitting: Physical Medicine & Rehabilitation

## 2022-10-31 NOTE — Telephone Encounter (Signed)
Patient is calling requesting a right side back injection?  She hasn't had one since 2023.  Please advise?

## 2022-11-10 ENCOUNTER — Encounter: Payer: Medicare Other | Attending: Physical Medicine & Rehabilitation | Admitting: Physical Medicine & Rehabilitation

## 2022-11-10 ENCOUNTER — Encounter: Payer: Self-pay | Admitting: Physical Medicine & Rehabilitation

## 2022-11-10 VITALS — BP 131/67 | HR 74 | Ht 64.0 in | Wt 176.2 lb

## 2022-11-10 DIAGNOSIS — G8929 Other chronic pain: Secondary | ICD-10-CM | POA: Diagnosis not present

## 2022-11-10 DIAGNOSIS — M533 Sacrococcygeal disorders, not elsewhere classified: Secondary | ICD-10-CM | POA: Diagnosis not present

## 2022-11-10 MED ORDER — BETAMETHASONE SOD PHOS & ACET 6 (3-3) MG/ML IJ SUSP
3.0000 mg | Freq: Once | INTRAMUSCULAR | Status: AC
Start: 2022-11-10 — End: 2022-11-10
  Administered 2022-11-10: 3 mg via INTRAMUSCULAR

## 2022-11-10 MED ORDER — LIDOCAINE HCL (PF) 2 % IJ SOLN
1.0000 mL | Freq: Once | INTRAMUSCULAR | Status: AC
Start: 2022-11-10 — End: 2022-11-10
  Administered 2022-11-10: 1 mL

## 2022-11-10 MED ORDER — LIDOCAINE HCL 1 % IJ SOLN
4.0000 mL | Freq: Once | INTRAMUSCULAR | Status: AC
Start: 2022-11-10 — End: 2022-11-10
  Administered 2022-11-10: 4 mL

## 2022-11-10 NOTE — Patient Instructions (Signed)
Sacroiliac injection was performed today. A combination of numbing medicine (lidocaine) plus a cortisone medicine (betamethasone) was injected. The injection was done under x-ray guidance. This procedure has been performed to help reduce low back and buttocks pain as well as potentially hip pain. The duration of this injection is variable lasting from hours to  Months. It may repeated if needed. 

## 2022-11-10 NOTE — Progress Notes (Signed)
Right sacroiliac injection under fluoroscopic guidance  Indication: Right Low back and buttocks pain not relieved by medication management and other conservative care.  Informed consent was obtained after describing risks and benefits of the procedure with the patient, this includes bleeding, bruising, infection, paralysis and medication side effects. The patient wishes to proceed and has given written consent. The patient was placed in a prone position. The lumbar and sacral area was marked and prepped with Betadine. A 25-gauge 1-1/2 inch needle was inserted into the skin and subcutaneous tissue and 1 mL of 1% lidocaine was injected. Then a 25-gauge 3 inch spinal needle was inserted under fluoroscopic guidance into the Right sacroiliac joint. AP and lateral images were utilized. Omnipaque 180x0.5 mL under live fluoroscopy demonstrated no intravascular uptake. Then a solution containing one ML of 6 mgper ml betamethasone and 2 ML of 1% lidocaine MPF was injected x1.5 mL. Patient tolerated the procedure well. Post procedure instructions were given. Please see post procedure form.   Pt also complains that left knee OA pain did not respond to gel injection , would like to schedule for genicular nerve block as an eval for genicular RF

## 2022-11-10 NOTE — Progress Notes (Signed)
  PROCEDURE RECORD [facility]   Name: [name] DOB:Jun 03, 1951 MRN: [id number]  Date:11/10/2022  Physician: Claudette Laws, MD    Nurse/CMA: Otilio Groleau RN  Allergies:  Allergies  Allergen Reactions   Cyclobenzaprine Hcl Anaphylaxis and Swelling    Reaction - throat closed, tongue swelling   Rizatriptan Benzoate Anaphylaxis    Throat and Tongue Swelling Closed   Sumatriptan Swelling    Throat closed & Tongue Swelling   Tizanidine Shortness Of Breath   Aleve [Naproxen Sodium] Hives   Aspartame Other (See Comments)   Clarithromycin Hives   Corn-Containing Products Other (See Comments)    Head congestion   Doxycycline Diarrhea and Nausea And Vomiting   Duloxetine Nausea Only   Egg [Egg-Derived Products] Diarrhea and Nausea And Vomiting   Keflex [Cephalexin] Itching and Other (See Comments)    Sores   Metronidazole Diarrhea and Nausea Only   Monosodium Glutamate Diarrhea and Other (See Comments)   Oxycodone Hcl Diarrhea and Nausea And Vomiting   Oxycontin [Oxycodone Hcl] Nausea And Vomiting   Penicillins Other (See Comments)    Unknown reaction Did it involve swelling of the face/tongue/throat, SOB, or low BP? Unknown Did it involve sudden or severe rash/hives, skin peeling, or any reaction on the inside of your mouth or nose? Unknown Did you need to seek medical attention at a hospital or doctor's office? Unknown When did it last happen?   infant   If all above answers are "NO", may proceed with cephalosporin use.   Rosuvastatin Other (See Comments)    Muscle aches   Venlafaxine Hives   Voltaren [Diclofenac Sodium] Hives    Reaction to oral med, uses gel occasionally   Septra [Sulfamethoxazole-Trimethoprim] Itching and Rash   Tramadol Diarrhea, Nausea And Vomiting and Palpitations    Consent Signed: Yes.    Is patient diabetic? Yes.    CBG today? No diet controlled  Pregnant: No. LMP: No LMP recorded. Patient has had a  hysterectomy. (age 37-55)  Anticoagulants: yes (Eliquis) Anti-inflammatory: no Antibiotics: no  Procedure: right sacroiliac steroid injection  Position: Prone Start Time: 1024  End Time: 1028  Fluoro Time: 24  RN/CMA Haematologist RN    Time 958 1037    BP 131/67 146/68    Pulse 74 74    Respirations 14 14    O2 Sat 97 97    S/S 6 6    Pain Level 7/10 7/10     D/C home with self, patient A & O X 3, D/C instructions reviewed, and sits independently.

## 2022-11-23 ENCOUNTER — Other Ambulatory Visit: Payer: Self-pay | Admitting: Physical Medicine & Rehabilitation

## 2022-11-28 ENCOUNTER — Other Ambulatory Visit: Payer: Self-pay | Admitting: Cardiology

## 2022-11-28 ENCOUNTER — Telehealth: Payer: Self-pay | Admitting: Registered Nurse

## 2022-11-28 MED ORDER — MORPHINE SULFATE ER 15 MG PO TBCR: 15.0000 mg | EXTENDED_RELEASE_TABLET | Freq: Three times a day (TID) | ORAL | 0 refills | Status: DC

## 2022-11-28 NOTE — Telephone Encounter (Signed)
PMP was Reviewed.  MS Contin e-scribed today to accommodate scheduled appointment.  [name], is aware of the above via My-Chart

## 2022-12-01 ENCOUNTER — Ambulatory Visit: Payer: Medicare Other | Admitting: Registered Nurse

## 2022-12-06 ENCOUNTER — Encounter: Payer: Self-pay | Admitting: Physical Medicine & Rehabilitation

## 2022-12-06 ENCOUNTER — Encounter: Payer: Medicare Other | Attending: Physical Medicine & Rehabilitation | Admitting: Physical Medicine & Rehabilitation

## 2022-12-06 VITALS — BP 122/70 | HR 70 | Temp 99.5°F | Ht 64.0 in | Wt 183.0 lb

## 2022-12-06 DIAGNOSIS — M1712 Unilateral primary osteoarthritis, left knee: Secondary | ICD-10-CM | POA: Insufficient documentation

## 2022-12-06 MED ORDER — BUPIVACAINE HCL (PF) 0.5 % IJ SOLN
5.0000 mL | Freq: Once | INTRAMUSCULAR | Status: AC
Start: 2022-12-06 — End: 2022-12-06
  Administered 2022-12-06: 5 mL

## 2022-12-06 MED ORDER — IOHEXOL 180 MG/ML  SOLN
2.0000 mL | Freq: Once | INTRAMUSCULAR | Status: AC
  Administered 2022-12-06: 2 mL

## 2022-12-06 MED ORDER — LIDOCAINE HCL 1 % IJ SOLN
10.0000 mL | Freq: Once | INTRAMUSCULAR | Status: AC
  Administered 2022-12-06: 10 mL

## 2022-12-06 NOTE — Progress Notes (Signed)
LEFT Genicular nerve block x 3, Upper medial, Upper lateral , and Lower Medial under fluoroscopic guidance  Indication Chronic post operative pain in the Knee, pain postop total knee replacement which has not responded to conservative management such as physical therapy and medication management  Informed consent was obtained after describing risks and to the procedure to the patient these include bleeding bruising and infection, patient elects to proceed and has given written consent. Patient placed supine on the fluoroscopy table AP images of the knee joint were obtained. A 25-gauge 1.5 inch needle was used to anesthetize the skin and subcutaneous tissue with 1% lidocaine, 1.5 cc at each of 3 locations. Then a 22-gauge 3.5" spinal needle was inserted targeting the junction of the medial flare of the tibia with the shaft of the tibia, bone contact made and confirmed with lateral imaging. Then Omnipaque 180 x0.5 mL demonstrated no intravascular uptake followed by injection of 1.31ml .5% bupivacaine. Then the junction of the medial epicondyles of the femur with the femoral shaft was targeted needle was advanced under fluoroscopic guidance until bone contact. Appropriate depth was obtained and confirmed with lateral images. Then Omnipaque 180 x0.5 mL demonstrated no intravascular uptake followed by injection of 1.67ml of .5% bupivacaine. Then the junction of the lateral femoral condyle with the femoral shaft was targeted. 22-gauge 3.5 inch needle was advanced under fluoroscopic guidance until bone contact. Appropriate depth was confirmed with lateral imaging. 0.5 mL of Omnipaque 180 injected followed by injection of 1.5 cc of .5% bupivacaine solution. Patient tolerated procedure well. Post procedure instructions given  Lidocaine 1% with preservative 4.68ml Omnipaque 180 1.46ml Bupivacaine 0.5% 4.60ml   Preinjection pain 5/10 Post injection pain 0/10

## 2022-12-06 NOTE — Progress Notes (Signed)
  PROCEDURE RECORD [facility]   Name: [name] DOB:June 14, 1951 MRN: [id number]  Date:12/06/2022  Physician: Claudette Laws, MD    Nurse/CMA: Nedra Hai, CMA  Allergies:  Allergies  Allergen Reactions   Cyclobenzaprine Hcl Anaphylaxis and Swelling    Reaction - throat closed, tongue swelling   Rizatriptan Benzoate Anaphylaxis    Throat and Tongue Swelling Closed   Sumatriptan Swelling    Throat closed & Tongue Swelling   Tizanidine Shortness Of Breath   Aleve [Naproxen Sodium] Hives   Aspartame Other (See Comments)   Clarithromycin Hives   Corn-Containing Products Other (See Comments)    Head congestion   Doxycycline Diarrhea and Nausea And Vomiting   Duloxetine Nausea Only   Egg [Egg-Derived Products] Diarrhea and Nausea And Vomiting   Keflex [Cephalexin] Itching and Other (See Comments)    Sores   Metronidazole Diarrhea and Nausea Only   Monosodium Glutamate Diarrhea and Other (See Comments)   Oxycodone Hcl Diarrhea and Nausea And Vomiting   Oxycontin [Oxycodone Hcl] Nausea And Vomiting   Penicillins Other (See Comments)    Unknown reaction Did it involve swelling of the face/tongue/throat, SOB, or low BP? Unknown Did it involve sudden or severe rash/hives, skin peeling, or any reaction on the inside of your mouth or nose? Unknown Did you need to seek medical attention at a hospital or doctor's office? Unknown When did it last happen?   infant   If all above answers are "NO", may proceed with cephalosporin use.   Rosuvastatin Other (See Comments)    Muscle aches   Venlafaxine Hives   Voltaren [Diclofenac Sodium] Hives    Reaction to oral med, uses gel occasionally   Septra [Sulfamethoxazole-Trimethoprim] Itching and Rash   Tramadol Diarrhea, Nausea And Vomiting and Palpitations    Consent Signed: Yes.    Is patient diabetic? Yes.    CBG today? Controlled by diet  Pregnant: No. LMP: No LMP recorded. Patient has had a  hysterectomy. (age [age])  Anticoagulants: yes (Eliquis) Anti-inflammatory: no Antibiotics: no  Procedure: Left Genicular Knee Injection  Position: Supine Start Time: 3:07 pm  End Time: 3:19 pm  Fluoro Time: 38  RN/CMA Lalita Ebel,CMA Levi Crass, CMA    Time 2:49 pm 3:26 pm    BP 122/71 130/71    Pulse 70 62    Respirations 16 16    O2 Sat 97 98    S/S 6 6    Pain Level 5/10 0/10     D/C home with  no one, patient A & O X 3, D/C instructions reviewed, and sits independently.

## 2022-12-07 ENCOUNTER — Other Ambulatory Visit: Payer: Self-pay | Admitting: Family Medicine

## 2022-12-22 ENCOUNTER — Telehealth: Payer: Self-pay | Admitting: Registered Nurse

## 2022-12-22 MED ORDER — MORPHINE SULFATE ER 15 MG PO TBCR: 15.0000 mg | EXTENDED_RELEASE_TABLET | Freq: Three times a day (TID) | ORAL | 0 refills | Status: DC

## 2022-12-22 NOTE — Telephone Encounter (Signed)
PMP was Reviewed.  MS Contin e-scribed to pharmacy [name] is aware via My-Chart message

## 2022-12-24 ENCOUNTER — Other Ambulatory Visit: Payer: Self-pay | Admitting: Family Medicine

## 2023-01-04 ENCOUNTER — Other Ambulatory Visit: Payer: Self-pay | Admitting: Cardiology

## 2023-01-04 ENCOUNTER — Other Ambulatory Visit: Payer: Self-pay | Admitting: Family Medicine

## 2023-01-04 DIAGNOSIS — I4819 Other persistent atrial fibrillation: Secondary | ICD-10-CM

## 2023-01-04 NOTE — Telephone Encounter (Signed)
Prescription refill request for Eliquis received. Indication: Afib  Last office visit: 09/14/22 Lalla Brothers)  Scr: 0.65 (10/03/22)  Age: 72 Weight: 83kg  Appropriate dose. Refill sent.

## 2023-01-13 ENCOUNTER — Encounter: Payer: Medicare Other | Admitting: Physical Medicine & Rehabilitation

## 2023-01-19 ENCOUNTER — Ambulatory Visit (INDEPENDENT_AMBULATORY_CARE_PROVIDER_SITE_OTHER): Payer: Medicare Other

## 2023-01-19 VITALS — BP 117/68 | HR 69 | Ht 64.5 in | Wt 178.0 lb

## 2023-01-19 DIAGNOSIS — Z0000 Encounter for general adult medical examination without abnormal findings: Secondary | ICD-10-CM | POA: Diagnosis not present

## 2023-01-19 DIAGNOSIS — Z780 Asymptomatic menopausal state: Secondary | ICD-10-CM

## 2023-01-19 NOTE — Progress Notes (Signed)
Subjective:   [name] is a 72 y.o. female who presents for Medicare Annual (Subsequent) preventive examination.  Visit Complete: Virtual  I connected with  [name] on 01/19/23 by a audio enabled telemedicine application and verified that I am speaking with the correct person using two identifiers.  Patient Location: Home  Provider Location: Home Office  I discussed the limitations of evaluation and management by telemedicine. The patient expressed understanding and agreed to proceed.  Patient Medicare AWV questionnaire was completed by the patient on 01/18/23; I have confirmed that all information answered by patient is correct and no changes since this date.  Review of Systems      Cardiac Risk Factors include: advanced age (>20men, >64 women);hypertension;dyslipidemia;diabetes mellitus;sedentary lifestyle  Vital Signs: Pt Reported     Objective:    Today's Vitals   01/19/23 1334  Weight: 178 lb (80.7 kg)  Height: 5' 4.5" (1.638 m)  PainSc: 5    Body mass index is 30.08 kg/m.     01/19/2023    1:44 PM 06/16/2022    2:02 PM 12/16/2021    9:25 AM 12/07/2021   11:40 AM 11/17/2021    2:15 PM 09/10/2021    3:10 PM 08/10/2021   10:13 AM  Advanced Directives  Does Patient Have a Medical Advance Directive? Yes Yes Yes Yes Yes No Yes  Type of Estate agent of [place];Living will Healthcare Power of [place];Living will Healthcare Power of [place];Living will Healthcare Power of [place];Living will Healthcare Power of [employer] of [place];Living will  Does patient want to make changes to medical advance directive? No - Patient declined  No - Patient declined   No - Patient declined No - Patient declined  Copy of Healthcare Power of Attorney in Chart? Yes - validated most recent copy scanned in chart (See row information) Yes - validated most recent copy scanned in chart (See row information)       Would patient like information  on creating a medical advance directive?      No - Patient declined     Current Medications (verified) Outpatient Encounter Medications as of 01/19/2023  Medication Sig   acetaminophen (TYLENOL) 650 MG CR tablet Take 650 mg by mouth in the morning and at bedtime.   cetirizine (ZYRTEC) 10 MG tablet Take 10 mg by mouth at bedtime.   Cholecalciferol (VITAMIN D) 125 MCG (5000 UT) CAPS Take 5,000 Units by mouth every morning.    clindamycin (CLEOCIN T) 1 % lotion Apply 1 application topically daily as needed (eczema).    Coenzyme Q-10 100 MG capsule Take 100 mg by mouth every morning.    diclofenac sodium (VOLTAREN) 1 % GEL Apply 4 g topically 4 (four) times daily as needed (joint pain).    diltiazem (CARDIZEM CD) 240 MG 24 hr capsule TAKE 1 CAPSULE BY MOUTH EVERY DAY   docusate sodium (COLACE) 250 MG capsule Take 250 mg by mouth at bedtime.   ELIQUIS 5 MG TABS tablet TAKE 1 TABLET BY MOUTH TWICE  DAILY   fluocinonide (LIDEX) 0.05 % external solution Apply 1 application topically daily as needed (eczema on scalp).    gabapentin (NEURONTIN) 300 MG capsule TAKE 1 CAPSULE BY MOUTH 3 TIMES  DAILY   hydroxypropyl methylcellulose / hypromellose (ISOPTO TEARS / GONIOVISC) 2.5 % ophthalmic solution Place 1-2 drops into both eyes 3 (three) times daily as needed (dry/irritated eyes.).   levothyroxine (SYNTHROID) 50 MCG tablet TAKE 1 TABLET BY  MOUTH DAILY   loratadine (CLARITIN) 10 MG tablet Take 10 mg by mouth every morning.    Magnesium Bisglycinate (MAG GLYCINATE PO) Take 400 mg by mouth at bedtime.   methocarbamol (ROBAXIN) 500 MG tablet TAKE 1 TABLET BY MOUTH EVERY 8 HOURS AS NEEDED FOR MUSCLE SPASM   morphine (MS CONTIN) 15 MG 12 hr tablet Take 1 tablet (15 mg total) by mouth 3 (three) times daily.   oxybutynin (DITROPAN-XL) 10 MG 24 hr tablet TAKE 1 TABLET BY MOUTH AT  BEDTIME   oxymetazoline (AFRIN) 0.05 % nasal spray Place 1 spray into both nostrils 2 (two) times daily as needed for congestion.    RABEprazole (ACIPHEX) 20 MG tablet TAKE 1 TABLET BY MOUTH EVERY DAY IN THE EVENING   Resveratrol 100 MG CAPS Take 100 mg by mouth in the morning and at bedtime.   tretinoin (RETIN-A) 0.05 % cream Apply 1 application  topically daily as needed (acne).   TYRVAYA 0.03 MG/ACT SOLN Place 1 spray into both nostrils at bedtime.   Wheat Dextrin (BENEFIBER) CHEW Chew 2 tablets by mouth 2 (two) times daily as needed (regularity).   diltiazem (CARDIZEM) 30 MG tablet Take one tablet by mouth every 4 hours as needed for heart rate greater than 100, top number blood pressure needs to be above 100   No facility-administered encounter medications on file as of 01/19/2023.    Allergies (verified) Cyclobenzaprine hcl, Rizatriptan benzoate, Sumatriptan, Tizanidine, Aleve [naproxen sodium], Aspartame, Clarithromycin, Corn-containing products, Doxycycline, Duloxetine, Egg [egg-derived products], Keflex [cephalexin], Metronidazole, Monosodium glutamate, Oxycodone hcl, Oxycontin [oxycodone hcl], Penicillins, Rosuvastatin, Venlafaxine, Voltaren [diclofenac sodium], Septra [sulfamethoxazole-trimethoprim], and Tramadol   History: Past Medical History:  Diagnosis Date   B12 deficiency    Complication of anesthesia    slow to wake up    Depression    Diabetes mellitus type 2, diet-controlled (HCC) 07/24/2018   Fibromyalgia    Generalized hyperhidrosis 04/17/2014   GERD (gastroesophageal reflux disease)    History of rheumatic fever as a child    Hyperlipemia    Hypertension    denies htn on 05/09/16   HYPOTHYROIDISM 05/29/2007   Irritable bowel syndrome    Migraines    hx of    OSA (obstructive sleep apnea) 06/26/2019   Senile calcific aortic valve sclerosis    Aortic Sclerosis-no stenosis   Past Surgical History:  Procedure Laterality Date   89 HOUR PH STUDY N/A 01/04/2016   Procedure: 24 HOUR PH STUDY;  Surgeon: Ruffin Frederick, MD;  Location: [facility];  Service: Gastroenterology;  Laterality:  N/A;   ABDOMINAL HYSTERECTOMY  1988   ADENOIDECTOMY     ANTERIOR CERVICAL DECOMP/DISCECTOMY FUSION  2009, 1993   first procedure in ~ 1993/Dr Botero   APPENDECTOMY  1988   ATRIAL FIBRILLATION ABLATION N/A 12/16/2021   Procedure: ATRIAL FIBRILLATION ABLATION;  Surgeon: Lanier Prude, MD;  Location: [facility]ASIVE CV LAB;  Service: Cardiovascular;  Laterality: N/A;   BACK SURGERY     CARDIOVERSION N/A 04/03/2019   Procedure: CARDIOVERSION;  Surgeon: Lars Masson, MD;  Location: [facility] ENDOSCOPY;  Service: Cardiovascular;  Laterality: N/A;   CHOLECYSTECTOMY  2005   ESOPHAGEAL MANOMETRY N/A 01/04/2016   Procedure: ESOPHAGEAL MANOMETRY (EM) 24 HR PH;  Surgeon: Ruffin Frederick, MD;  Location: [facility] ENDOSCOPY;  Service: Gastroenterology;  Laterality: N/A;   ESOPHAGOGASTRODUODENOSCOPY (EGD) WITH PROPOFOL N/A 05/17/2016   Procedure: ESOPHAGOGASTRODUODENOSCOPY (EGD) WITH PROPOFOL;  Surgeon: Ruffin Frederick, MD;  Location: [facility] ENDOSCOPY;  Service: Gastroenterology;  Laterality: N/A;   FOOT SURGERY Left 08/2022   NASAL SINUS SURGERY  2006   SAVORY DILATION N/A 05/17/2016   Procedure: SAVORY DILATION;  Surgeon: Ruffin Frederick, MD;  Location: [facility] ENDOSCOPY;  Service: Gastroenterology;  Laterality: N/A;   TEE WITHOUT CARDIOVERSION N/A 04/03/2019   Procedure: TRANSESOPHAGEAL ECHOCARDIOGRAM (TEE)-with DCCV;  Surgeon: Lars Masson, MD;  Location: [facility] END[facility]COPY;; EF 55 to 60%.  No LVH.  Normal RV.  Moderate LA dilation.  No thrombus.  Mild RA dilation.  Mild to moderate TR.  Mild aortic valve sclerosis, no stenosis.  Moderately elevated PA pressures (47 mmHg)   TONSILLECTOMY AND ADENOIDECTOMY     TRANSTHORACIC ECHOCARDIOGRAM  2009   Normal LV size and function. EF 60-65%. No regional wall motion and amount is. Mild aortic sclerosis, no stenosis   TUBAL LIGATION  1979   Family History  Problem Relation Age of Onset   Breast cancer Mother    Diabetes Mother    Heart disease Mother     Allergies Mother    Diabetes Father    Heart disease Father    Emphysema Father    Allergies Father    Breast cancer Paternal Aunt    Colon cancer Neg Hx    Esophageal cancer Neg Hx    Rectal cancer Neg Hx    Stomach cancer Neg Hx    Social History   Socioeconomic History   Marital status: Widowed    Spouse name: Not on file   Number of children: Not on file   Years of education: Not on file   Highest education level: Not on file  Occupational History   Occupation: Catering manager   Occupation: retired  Tobacco Use   Smoking status: Never   Smokeless tobacco: Never   Tobacco comments:    Never smoke 01/13/22  Vaping Use   Vaping status: Never Used  Substance and Sexual Activity   Alcohol use: Yes    Alcohol/week: 1.0 standard drink of alcohol    Types: 1 Glasses of wine per week    Comment: rare   Drug use: Never   Sexual activity: Not Currently  Other Topics Concern   Not on file  Social History Narrative   Not on file   Social Determinants of Health   Financial Resource Strain: Low Risk  (01/19/2023)   Overall Financial Resource Strain (CARDIA)    Difficulty of Paying Living Expenses: Not hard at all  Food Insecurity: No Food Insecurity (01/19/2023)   Hunger Vital Sign    Worried About Running Out of Food in the Last Year: Never true    Ran Out of Food in the Last Year: Never true  Transportation Needs: No Transportation Needs (01/19/2023)   PRAPARE - Administrator, Civil Service (Medical): No    Lack of Transportation (Non-Medical): No  Physical Activity: Insufficiently Active (01/19/2023)   Exercise Vital Sign    Days of Exercise per Week: 7 days    Minutes of Exercise per Session: 20 min  Stress: No Stress Concern Present (01/19/2023)   [employer] of Occupational Health - Occupational Stress Questionnaire    Feeling of Stress : Not at all  Social Connections: Moderately Integrated (01/19/2023)   Social Connection and Isolation Panel  [NHANES]    Frequency of Communication with Friends and Family: More than three times a week    Frequency of Social Gatherings with Friends and Family: More than three times a week    Attends Religious  Services: More than 4 times per year    Active Member of Clubs or Organizations: Yes    Attends Banker Meetings: More than 4 times per year    Marital Status: Widowed    Tobacco Counseling Counseling given: Not Answered Tobacco comments: Never smoke 01/13/22   Clinical Intake:  Pre-visit preparation completed: Yes  Pain : 0-10 Pain Score: 5  Pain Type: Chronic pain Pain Location: Generalized Pain Descriptors / Indicators: Aching Pain Onset: Other (comment) (years)     BMI - recorded: 30.08 Nutritional Status: BMI > 30  Obese Nutritional Risks: None Diabetes: Yes (diet controlled) CBG done?: No Did pt. bring in CBG monitor from home?: No  How often do you need to have someone help you when you read instructions, pamphlets, or other written materials from your doctor or pharmacy?: 1 - Never  Interpreter Needed?: No  Information entered by :: C.Kamrin Spath LPN   Activities of Daily Living    01/18/2023    8:29 PM 06/16/2022    2:03 PM  In your present state of health, do you have any difficulty performing the following activities:  Hearing? 0 0  Vision? 0 0  Difficulty concentrating or making decisions? 0 0  Walking or climbing stairs? 1 0  Comment Knee pain   Dressing or bathing? 0 0  Doing errands, shopping? 0 0  Preparing Food and eating ? N N  Using the Toilet? N N  In the past six months, have you accidently leaked urine? Y N  Comment Occasionally   Do you have problems with loss of bowel control? N N  Managing your Medications? N N  Managing your Finances? N N  Housekeeping or managing your Housekeeping? N N    Patient Care Team: Hannah Beat, MD as PCP - General Marykay Lex, MD as PCP - Cardiology (Cardiology) Lanier Prude,  MD as PCP - Electrophysiology (Cardiology) Wynn Banker Victorino Sparrow, MD as Consulting Physician (Physical Medicine and Rehabilitation) Armbruster, Willaim Rayas, MD as Consulting Physician (Gastroenterology) Marykay Lex, MD as Consulting Physician (Cardiology) Coralyn Helling, MD as Consulting Physician (Pulmonary Disease) Kathyrn Sheriff, [facility] (Inactive) as Pharmacist (Pharmacist)  Indicate any recent Medical Services you may have received from other than [facility] providers in the past year (date may be approximate).     Assessment:   This is a routine wellness examination for [place].  Hearing/Vision screen Hearing Screening - Comments:: Denies hearing difficulties   Vision Screening - Comments:: Readers - Dr.Richardson - UTD on eye exams  Dietary issues and exercise activities discussed:     Goals Addressed             This Visit's Progress    Patient Stated       Maintain current health status.       Depression Screen    01/19/2023    1:43 PM 10/25/2022    1:08 PM 09/28/2022    2:07 PM 08/29/2022    1:54 PM 08/04/2022   12:54 PM 07/06/2022    1:19 PM 06/16/2022    2:01 PM  PHQ 2/9 Scores  PHQ - 2 Score 0 0 0 0 0 0 0    Fall Risk    01/19/2023    1:45 PM 01/18/2023    8:29 PM 12/06/2022    2:47 PM 10/25/2022    1:08 PM 09/28/2022    2:06 PM  Fall Risk   Falls in the past year? 0 0 0 0 0  Number falls in past yr: 0   0   Injury with Fall? 1   0   Comment tripped over hospital bed      Risk for fall due to : No Fall Risks      Follow up Falls prevention discussed;Falls evaluation completed        MEDICARE RISK AT HOME:  Medicare Risk at Home - 01/18/23 2029     Home free of loose throw rugs in walkways, pet beds, electrical cords, etc? No   Throw rugs, puppies            TIMED UP AND GO:  Was the test performed?  No    Cognitive Function:    02/12/2020    1:23 PM 01/18/2019   11:29 AM 01/10/2018   12:08 PM 01/02/2017    1:23 PM  MMSE - Mini Mental State Exam   Orientation to time 5 5 5 5   Orientation to Place 5 5 5 5   Registration 3 3 3 3   Attention/ Calculation 5 5 0 0  Recall 3 3 3 3   Language- name 2 objects  0 0 0  Language- repeat 1 1 1 1   Language- follow 3 step command  0 3 3  Language- read & follow direction  0 0 0  Write a sentence  0 0 0  Copy design  0 0 0  Total score  22 20 20         01/19/2023    1:48 PM 06/16/2022    2:04 PM  6CIT Screen  What Year? 0 points 0 points  What month? 0 points 0 points  What time? 0 points 0 points  Count back from 20 0 points   Months in reverse 0 points   Repeat phrase 0 points   Total Score 0 points     Immunizations Immunization History  Administered Date(s) Administered   PFIZER(Purple Top)SARS-COV-2 Vaccination 08/22/2019, 09/18/2019   Pneumococcal Conjugate-13 01/02/2017   Pneumococcal Polysaccharide-23 05/06/2013, 07/25/2018   Td 06/28/2003, 10/16/2013   Zoster, Live 11/07/2012    TDAP status: Up to date  Flu Vaccine status: Up to date  Pneumococcal vaccine status: Up to date  Covid-19 vaccine status: Information provided on how to obtain vaccines.   Qualifies for Shingles Vaccine? Yes   Zostavax completed  unknown   Shingrix Completed?: Yes  Screening Tests Health Maintenance  Topic Date Due   Zoster Vaccines- Shingrix (1 of 2) 11/02/1970   COVID-19 Vaccine (3 - Pfizer risk series) 10/16/2019   INFLUENZA VACCINE  01/26/2023   HEMOGLOBIN A1C  04/04/2023   OPHTHALMOLOGY EXAM  04/05/2023   Diabetic kidney evaluation - eGFR measurement  10/03/2023   Diabetic kidney evaluation - Urine ACR  10/03/2023   FOOT EXAM  10/10/2023   DTaP/Tdap/Td (3 - Tdap) 10/17/2023   Medicare Annual Wellness (AWV)  01/19/2024   MAMMOGRAM  01/28/2024   Colonoscopy  10/11/2028   Pneumonia Vaccine 60+ Years old  Completed   DEXA SCAN  Completed   Hepatitis C Screening  Completed   HPV VACCINES  Aged Out   Fecal DNA (Cologuard)  Discontinued    Health Maintenance  Health  Maintenance Due  Topic Date Due   Zoster Vaccines- Shingrix (1 of 2) 11/02/1970   COVID-19 Vaccine (3 - Pfizer risk series) 10/16/2019    Colorectal cancer screening: Type of screening: Colonoscopy. Completed 10/11/21. Repeat every 7 years  Mammogram status: Ordered 01/19/23. Pt provided with contact info and advised to  call to schedule appt.   Bone Density status: Ordered 01/19/23. Pt provided with contact info and advised to call to schedule appt.  Lung Cancer Screening: (Low Dose CT Chest recommended if Age 32-80 years, 20 pack-year currently smoking OR have quit w/in 15years.) does not qualify.   Lung Cancer Screening Referral: na  Additional Screening:  Hepatitis C Screening: does qualify; Completed 04/01/19  Vision Screening: Recommended annual ophthalmology exams for early detection of glaucoma and other disorders of the eye. Is the patient up to date with their annual eye exam?  Yes  Who is the provider or what is the name of the office in which the patient attends annual eye exams? Dr.Richardson If pt is not established with a provider, would they like to be referred to a provider to establish care? Yes .   Dental Screening: Recommended annual dental exams for proper oral hygiene  Diabetic Foot Exam: Diabetic Foot Exam: Completed 10/10/22  Community Resource Referral / Chronic Care Management: CRR required this visit?  No   CCM required this visit?  No     Plan:     I have personally reviewed and noted the following in the patient's chart:   Medical and social history Use of alcohol, tobacco or illicit drugs  Current medications and supplements including opioid prescriptions. Patient is currently taking opioid prescriptions. Information provided to patient regarding non-opioid alternatives. Patient advised to discuss non-opioid treatment plan with their provider. Functional ability and status Nutritional status Physical activity Advanced directives List of  other physicians Hospitalizations, surgeries, and ER visits in previous 12 months Vitals Screenings to include cognitive, depression, and falls Referrals and appointments  In addition, I have reviewed and discussed with patient certain preventive protocols, quality metrics, and best practice recommendations. A written personalized care plan for preventive services as well as general preventive health recommendations were provided to patient.     Maryan Puls, LPN   1/61/0960   After Visit Summary: (MyChart) Due to this being a telephonic visit, the after visit summary with patients personalized plan was offered to patient via MyChart   Nurse Notes: none

## 2023-01-19 NOTE — Patient Instructions (Addendum)
[name] , Thank you for taking time to come for your Medicare Wellness Visit. I appreciate your ongoing commitment to your health goals. Please review the following plan we discussed and let me know if I can assist you in the future.   Referrals/Orders/Follow-Ups/Clinician Recommendations: Aim for 30 minutes of exercise or brisk walking, 6-8 glasses of water, and 5 servings of fruits and vegetables each day.   You have an order for:  []   2D Mammogram  [x]   3D Mammogram  [x]   Bone Density     Please call for appointment:  [facility] [address] [place], [place] [address] [phone]  [facility] [address] [place], [place] [address] [phone]  [facility] [address] [place], [place] [address] [phone]  [facility] [address] [place], [place] [address] [phone]  [facility] [address] [place], [place] [address] [phone]    Make sure to wear two-piece clothing.  No lotions, powders, or deodorants the day of the appointment. Make sure to bring picture ID and insurance card.  Bring list of medications you are currently taking including any supplements.   Schedule your [place] screening mammogram through MyChart!   Log into your MyChart account.  Go to 'Visit' (or 'Appointments' if on mobile App) --> Schedule an Appointment  Under 'Select a Reason for Visit' choose the Mammogram Screening option.  Complete the pre-visit questions and select the time and place that best fits your schedule.    This is a list of the screening recommended for you and due dates:  Health Maintenance  Topic Date Due   Zoster (Shingles) Vaccine (1 of 2) [birth year]/02/1970   COVID-19 Vaccine (3 - Pfizer risk series) 10/16/2019   Flu Shot  01/26/2023   Hemoglobin A1C  04/04/2023   Eye exam for diabetics  04/05/2023    Medicare Annual Wellness Visit  06/17/2023   Yearly kidney function blood test for diabetes  10/03/2023   Yearly kidney health urinalysis for diabetes  10/03/2023   Complete foot exam   10/10/2023   DTaP/Tdap/Td vaccine (3 - Tdap) 10/17/2023   Mammogram  01/28/2024   Colon Cancer Screening  10/11/2028   Pneumonia Vaccine  Completed   DEXA scan (bone density measurement)  Completed   Hepatitis C Screening  Completed   HPV Vaccine  Aged Out   Cologuard (Stool DNA test)  Discontinued   Managing Pain Without Opioids Opioids are strong medicines used to treat moderate to severe pain. For some people, especially those who have long-term (chronic) pain, opioids may not be the best choice for pain management due to: Side effects like nausea, constipation, and sleepiness. The risk of addiction (opioid use disorder). The longer you take opioids, the greater your risk of addiction. Pain that lasts for more than 3 months is called chronic pain. Managing chronic pain usually requires more than one approach and is often provided by a team of health care providers working together (multidisciplinary approach). Pain management may be done at a pain management center or pain clinic. How to manage pain without the use of opioids Use non-opioid medicines Non-opioid medicines for pain may include: Over-the-counter or prescription non-steroidal anti-inflammatory drugs (NSAIDs). These may be the first medicines used for pain. They work well for muscle and bone pain, and they reduce swelling. Acetaminophen. This over-the-counter medicine may work well for milder pain  but not swelling. Antidepressants. These may be used to treat chronic pain. A certain type of antidepressant (tricyclics) is often used. These medicines are given in lower doses for pain than when used for depression. Anticonvulsants. These are usually used to treat seizures but may also reduce nerve (neuropathic) pain. Muscle relaxants. These relieve  pain caused by sudden muscle tightening (spasms). You may also use a pain medicine that is applied to the skin as a patch, cream, or gel (topical analgesic), such as a numbing medicine. These may cause fewer side effects than medicines taken by mouth. Do certain therapies as directed Some therapies can help with pain management. They include: Physical therapy. You will do exercises to gain strength and flexibility. A physical therapist may teach you exercises to move and stretch parts of your body that are weak, stiff, or painful. You can learn these exercises at physical therapy visits and practice them at home. Physical therapy may also involve: Massage. Heat wraps or applying heat or cold to affected areas. Electrical signals that interrupt pain signals (transcutaneous electrical nerve stimulation, TENS). Weak lasers that reduce pain and swelling (low-level laser therapy). Signals from your body that help you learn to regulate pain (biofeedback). Occupational therapy. This helps you to learn ways to function at home and work with less pain. Recreational therapy. This involves trying new activities or hobbies, such as a physical activity or drawing. Mental health therapy, including: Cognitive behavioral therapy (CBT). This helps you learn coping skills for dealing with pain. Acceptance and commitment therapy (ACT) to change the way you think and react to pain. Relaxation therapies, including muscle relaxation exercises and mindfulness-based stress reduction. Pain management counseling. This may be individual, family, or group counseling.  Receive medical treatments Medical treatments for pain management include: Nerve block injections. These may include a pain blocker and anti-inflammatory medicines. You may have injections: Near the spine to relieve chronic back or neck pain. Into joints to relieve back or joint pain. Into nerve areas that supply a painful area to relieve body pain. Into  muscles (trigger point injections) to relieve some painful muscle conditions. A medical device placed near your spine to help block pain signals and relieve nerve pain or chronic back pain (spinal cord stimulation device). Acupuncture. Follow these instructions at home Medicines Take over-the-counter and prescription medicines only as told by your health care provider. If you are taking pain medicine, ask your health care providers about possible side effects to watch out for. Do not drive or use heavy machinery while taking prescription opioid pain medicine. Lifestyle  Do not use drugs or alcohol to reduce pain. If you drink alcohol, limit how much you have to: 0-1 drink a day for women who are not pregnant. 0-2 drinks a day for men. Know how much alcohol is in a drink. In the [place] one drink equals one 12 oz bottle of beer (355 mL), one 5 oz glass of wine (148 mL), or one 1 oz glass of hard liquor (44 mL). Do not use any products that contain nicotine or tobacco. These products include cigarettes, chewing tobacco, and vaping devices, such as e-cigarettes. If you need help quitting, ask your health care provider. Eat a healthy diet and maintain a healthy weight. Poor diet and excess weight may make pain worse. Eat foods that are high in fiber. These include fresh fruits and vegetables, whole grains, and beans. Limit foods that are high in fat and processed sugars, such as fried and sweet foods. Exercise  regularly. Exercise lowers stress and may help relieve pain. Ask your health care provider what activities and exercises are safe for you. If your health care provider approves, join an exercise class that combines movement and stress reduction. Examples include yoga and tai chi. Get enough sleep. Lack of sleep may make pain worse. Lower stress as much as possible. Practice stress reduction techniques as told by your therapist. General instructions Work with all your pain management  providers to find the treatments that work best for you. You are an important member of your pain management team. There are many things you can do to reduce pain on your own. Consider joining an online or in-person support group for people who have chronic pain. Keep all follow-up visits. This is important. Where to find more information You can find more information about managing pain without opioids from: American Academy of Pain Medicine: painmed.org Institute for Chronic Pain: instituteforchronicpain.org American Chronic Pain Association: theacpa.org Contact a health care provider if: You have side effects from pain medicine. Your pain gets worse or does not get better with treatments or home therapy. You are struggling with anxiety or depression. Summary Many types of pain can be managed without opioids. Chronic pain may respond better to pain management without opioids. Pain is best managed when you and a team of health care providers work together. Pain management without opioids may include non-opioid medicines, medical treatments, physical therapy, mental health therapy, and lifestyle changes. Tell your health care providers if your pain gets worse or is not being managed well enough. This information is not intended to replace advice given to you by your health care provider. Make sure you discuss any questions you have with your health care provider. Document Revised: 09/23/2020 Document Reviewed: 09/23/2020 Elsevier Patient Education  2024 Elsevier Inc.   Advanced directives: Copy is on file in your chart.  Next Medicare Annual Wellness Visit scheduled for next year: Yes  Preventive Care 62 Years and Older, Female Preventive care refers to lifestyle choices and visits with your health care provider that can promote health and wellness. What does preventive care include? A yearly physical exam. This is also called an annual well check. Dental exams once or twice a  year. Routine eye exams. Ask your health care provider how often you should have your eyes checked. Personal lifestyle choices, including: Daily care of your teeth and gums. Regular physical activity. Eating a healthy diet. Avoiding tobacco and drug use. Limiting alcohol use. Practicing safe sex. Taking low-dose aspirin every day. Taking vitamin and mineral supplements as recommended by your health care provider. What happens during an annual well check? The services and screenings done by your health care provider during your annual well check will depend on your age, overall health, lifestyle risk factors, and family history of disease. Counseling  Your health care provider may ask you questions about your: Alcohol use. Tobacco use. Drug use. Emotional well-being. Home and relationship well-being. Sexual activity. Eating habits. History of falls. Memory and ability to understand (cognition). Work and work Astronomer. Reproductive health. Screening  You may have the following tests or measurements: Height, weight, and BMI. Blood pressure. Lipid and cholesterol levels. These may be checked every 5 years, or more frequently if you are over 34 years old. Skin check. Lung cancer screening. You may have this screening every year starting at age 47 if you have a 30-pack-year history of smoking and currently smoke or have quit within the past 15 years. Fecal occult  blood test (FOBT) of the stool. You may have this test every year starting at age 36. Flexible sigmoidoscopy or colonoscopy. You may have a sigmoidoscopy every 5 years or a colonoscopy every 10 years starting at age 70. Hepatitis C blood test. Hepatitis B blood test. Sexually transmitted disease (STD) testing. Diabetes screening. This is done by checking your blood sugar (glucose) after you have not eaten for a while (fasting). You may have this done every 1-3 years. Bone density scan. This is done to screen for  osteoporosis. You may have this done starting at age 66. Mammogram. This may be done every 1-2 years. Talk to your health care provider about how often you should have regular mammograms. Talk with your health care provider about your test results, treatment options, and if necessary, the need for more tests. Vaccines  Your health care provider may recommend certain vaccines, such as: Influenza vaccine. This is recommended every year. Tetanus, diphtheria, and acellular pertussis (Tdap, Td) vaccine. You may need a Td booster every 10 years. Zoster vaccine. You may need this after age 69. Pneumococcal 13-valent conjugate (PCV13) vaccine. One dose is recommended after age 16. Pneumococcal polysaccharide (PPSV23) vaccine. One dose is recommended after age 39. Talk to your health care provider about which screenings and vaccines you need and how often you need them. This information is not intended to replace advice given to you by your health care provider. Make sure you discuss any questions you have with your health care provider. Document Released: 07/10/2015 Document Revised: 03/02/2016 Document Reviewed: 04/14/2015 Elsevier Interactive Patient Education  2017 [employer].  Fall Prevention in the Home Falls can cause injuries. They can happen to people of all ages. There are many things you can do to make your home safe and to help prevent falls. What can I do on the outside of my home? Regularly fix the edges of walkways and driveways and fix any cracks. Remove anything that might make you trip as you walk through a door, such as a raised step or threshold. Trim any bushes or trees on the path to your home. Use bright outdoor lighting. Clear any walking paths of anything that might make someone trip, such as rocks or tools. Regularly check to see if handrails are loose or broken. Make sure that both sides of any steps have handrails. Any raised decks and porches should have guardrails on  the edges. Have any leaves, snow, or ice cleared regularly. Use sand or salt on walking paths during winter. Clean up any spills in your garage right away. This includes oil or grease spills. What can I do in the bathroom? Use night lights. Install grab bars by the toilet and in the tub and shower. Do not use towel bars as grab bars. Use non-skid mats or decals in the tub or shower. If you need to sit down in the shower, use a plastic, non-slip stool. Keep the floor dry. Clean up any water that spills on the floor as soon as it happens. Remove soap buildup in the tub or shower regularly. Attach bath mats securely with double-sided non-slip rug tape. Do not have throw rugs and other things on the floor that can make you trip. What can I do in the bedroom? Use night lights. Make sure that you have a light by your bed that is easy to reach. Do not use any sheets or blankets that are too big for your bed. They should not hang down onto the floor.  Have a firm chair that has side arms. You can use this for support while you get dressed. Do not have throw rugs and other things on the floor that can make you trip. What can I do in the kitchen? Clean up any spills right away. Avoid walking on wet floors. Keep items that you use a lot in easy-to-reach places. If you need to reach something above you, use a strong step stool that has a grab bar. Keep electrical cords out of the way. Do not use floor polish or wax that makes floors slippery. If you must use wax, use non-skid floor wax. Do not have throw rugs and other things on the floor that can make you trip. What can I do with my stairs? Do not leave any items on the stairs. Make sure that there are handrails on both sides of the stairs and use them. Fix handrails that are broken or loose. Make sure that handrails are as long as the stairways. Check any carpeting to make sure that it is firmly attached to the stairs. Fix any carpet that is loose  or worn. Avoid having throw rugs at the top or bottom of the stairs. If you do have throw rugs, attach them to the floor with carpet tape. Make sure that you have a light switch at the top of the stairs and the bottom of the stairs. If you do not have them, ask someone to add them for you. What else can I do to help prevent falls? Wear shoes that: Do not have high heels. Have rubber bottoms. Are comfortable and fit you well. Are closed at the toe. Do not wear sandals. If you use a stepladder: Make sure that it is fully opened. Do not climb a closed stepladder. Make sure that both sides of the stepladder are locked into place. Ask someone to hold it for you, if possible. Clearly mark and make sure that you can see: Any grab bars or handrails. First and last steps. Where the edge of each step is. Use tools that help you move around (mobility aids) if they are needed. These include: Canes. Walkers. Scooters. Crutches. Turn on the lights when you go into a dark area. Replace any light bulbs as soon as they burn out. Set up your furniture so you have a clear path. Avoid moving your furniture around. If any of your floors are uneven, fix them. If there are any pets around you, be aware of where they are. Review your medicines with your doctor. Some medicines can make you feel dizzy. This can increase your chance of falling. Ask your doctor what other things that you can do to help prevent falls. This information is not intended to replace advice given to you by your health care provider. Make sure you discuss any questions you have with your health care provider. Document Released: 04/09/2009 Document Revised: 11/19/2015 Document Reviewed: 07/18/2014 Elsevier Interactive Patient Education  2017 [employer].

## 2023-01-21 ENCOUNTER — Other Ambulatory Visit: Payer: Self-pay | Admitting: Registered Nurse

## 2023-01-23 ENCOUNTER — Telehealth: Payer: Self-pay | Admitting: Registered Nurse

## 2023-01-23 ENCOUNTER — Other Ambulatory Visit: Payer: Self-pay | Admitting: Family Medicine

## 2023-01-23 DIAGNOSIS — Z1231 Encounter for screening mammogram for malignant neoplasm of breast: Secondary | ICD-10-CM

## 2023-01-23 MED ORDER — MORPHINE SULFATE ER 15 MG PO TBCR: 15.0000 mg | EXTENDED_RELEASE_TABLET | Freq: Three times a day (TID) | ORAL | 0 refills | Status: DC

## 2023-01-23 NOTE — Telephone Encounter (Signed)
PMP was Reviewed.  MS Contin e-scribed to pharmacy to accommodate scheduled appointment .  [name] is aware via My-Chart message.

## 2023-02-02 ENCOUNTER — Encounter (INDEPENDENT_AMBULATORY_CARE_PROVIDER_SITE_OTHER): Payer: Self-pay

## 2023-02-03 DIAGNOSIS — Z1231 Encounter for screening mammogram for malignant neoplasm of breast: Secondary | ICD-10-CM

## 2023-02-07 ENCOUNTER — Encounter: Payer: Self-pay | Admitting: Family Medicine

## 2023-02-09 ENCOUNTER — Encounter: Payer: Medicare Other | Attending: Physical Medicine & Rehabilitation | Admitting: Physical Medicine & Rehabilitation

## 2023-02-09 ENCOUNTER — Encounter: Payer: Self-pay | Admitting: Physical Medicine & Rehabilitation

## 2023-02-09 DIAGNOSIS — G894 Chronic pain syndrome: Secondary | ICD-10-CM | POA: Insufficient documentation

## 2023-02-09 DIAGNOSIS — Z79891 Long term (current) use of opiate analgesic: Secondary | ICD-10-CM | POA: Insufficient documentation

## 2023-02-09 DIAGNOSIS — M546 Pain in thoracic spine: Secondary | ICD-10-CM | POA: Insufficient documentation

## 2023-02-09 DIAGNOSIS — Z5181 Encounter for therapeutic drug level monitoring: Secondary | ICD-10-CM | POA: Diagnosis present

## 2023-02-09 DIAGNOSIS — M1712 Unilateral primary osteoarthritis, left knee: Secondary | ICD-10-CM | POA: Diagnosis present

## 2023-02-09 DIAGNOSIS — G8929 Other chronic pain: Secondary | ICD-10-CM | POA: Insufficient documentation

## 2023-02-09 MED ORDER — BUPIVACAINE HCL (PF) 0.5 % IJ SOLN
5.0000 mL | Freq: Once | INTRAMUSCULAR | Status: AC
Start: 2023-02-09 — End: 2023-02-09
  Administered 2023-02-09: 5 mL

## 2023-02-09 MED ORDER — LIDOCAINE HCL 1 % IJ SOLN
10.0000 mL | Freq: Once | INTRAMUSCULAR | Status: AC
Start: 2023-02-09 — End: 2023-02-09
  Administered 2023-02-09: 10 mL

## 2023-02-09 NOTE — Progress Notes (Signed)
  PROCEDURE RECORD [facility]   Name: [name] DOB:1950/10/24 MRN: [id number]  Date:02/09/2023  Physician: Claudette Laws, MD    Nurse/CMA: Nedra Hai, CMA  Allergies:  Allergies  Allergen Reactions   Cyclobenzaprine Hcl Anaphylaxis and Swelling    Reaction - throat closed, tongue swelling   Rizatriptan Benzoate Anaphylaxis    Throat and Tongue Swelling Closed   Sumatriptan Swelling    Throat closed & Tongue Swelling   Tizanidine Shortness Of Breath   Aleve [Naproxen Sodium] Hives   Aspartame Other (See Comments)   Clarithromycin Hives   Corn-Containing Products Other (See Comments)    Head congestion   Doxycycline Diarrhea and Nausea And Vomiting   Duloxetine Nausea Only   Egg [Egg-Derived Products] Diarrhea and Nausea And Vomiting   Keflex [Cephalexin] Itching and Other (See Comments)    Sores   Metronidazole Diarrhea and Nausea Only   Monosodium Glutamate Diarrhea and Other (See Comments)   Oxycodone Hcl Diarrhea and Nausea And Vomiting   Oxycontin [Oxycodone Hcl] Nausea And Vomiting   Penicillins Other (See Comments)    Unknown reaction Did it involve swelling of the face/tongue/throat, SOB, or low BP? Unknown Did it involve sudden or severe rash/hives, skin peeling, or any reaction on the inside of your mouth or nose? Unknown Did you need to seek medical attention at a hospital or doctor's office? Unknown When did it last happen?   infant   If all above answers are "NO", may proceed with cephalosporin use.   Rosuvastatin Other (See Comments)    Muscle aches   Venlafaxine Hives   Voltaren [Diclofenac Sodium] Hives    Reaction to oral med, uses gel occasionally   Septra [Sulfamethoxazole-Trimethoprim] Itching and Rash   Tramadol Diarrhea, Nausea And Vomiting and Palpitations    Consent Signed: Yes.    Is patient diabetic? Yes.    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 LMP: No LMP recorded. Patient has had a  hysterectomy. (age 78-55)  Anticoagulants: yes (Eliquis) Anti-inflammatory: no Antibiotics: no  Procedure: Left Genicular Radiofrequency  Position: Supine Start Time: 3:10 pm  End Time: 3:32 pm  Fluoro Time: 43  RN/CMA Nedra Hai, CMA Murriel Eidem, CMA    Time 2:58 pm 3:40    BP 128/69 136/55    Pulse 58 59    Respirations 16 16    O2 Sat 97 97    S/S 6 6    Pain Level 4/10 6     D/C home with niece, patient A & O X 3, D/C instructions reviewed, and sits independently.

## 2023-02-09 NOTE — Progress Notes (Addendum)
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 Patient placed supine on the fluoroscopy table AP images of the knee joint were obtained. A 25-gauge 1.5 inch needle was used to anesthetize the skin and subcutaneous tissue with 1% lidocaine, 1.5 cc at each of 3 locations. Then a 22-gauge 3.5" spinal needle was inserted targeting the junction of the medial flare of the tibia with the shaft of the tibia, bone contact made and confirmed with lateral imaging. Then Omnipaque 180  x0.5 mL demonstrated no intravascular uptake followed by injection of 1.68ml .5% bupivacaine. Then the junction of the medial epicondyles of the femur with the femoral shaft was targeted needle was advanced under fluoroscopic guidance until bone contact. Appropriate depth was obtained and confirmed with lateral images. Then Omnipaque 180  x0.5 mL demonstrated no intravascular uptake followed by injection of 1.85ml of .5% bupivacaine. Then the junction of the lateral femoral condyle with the femoral shaft was targeted. 22-gauge 3.5 inch needle was advanced under fluoroscopic guidance until bone contact. Appropriate depth was confirmed with lateral imaging. 0.5 mL of Omnipaque 180 injected followed by injection of 1.5 cc of .5% bupivacaine solution. Patient tolerated procedure well. Post procedure instructions given  Med Lidocaine with preservative 4.71ml Omnipaque 180 1.36ml Bupivacaine 0.5% 4.75ml

## 2023-02-09 NOTE — Patient Instructions (Signed)
May use ice + tylenol for post procedure pain

## 2023-02-10 ENCOUNTER — Ambulatory Visit
Admission: RE | Admit: 2023-02-10 | Discharge: 2023-02-10 | Disposition: A | Discharge status: Home / Self Care | Payer: Medicare Other | Source: Ambulatory Visit | Attending: Family Medicine | Admitting: Family Medicine

## 2023-02-10 DIAGNOSIS — Z1231 Encounter for screening mammogram for malignant neoplasm of breast: Secondary | ICD-10-CM

## 2023-02-23 ENCOUNTER — Encounter (HOSPITAL_BASED_OUTPATIENT_CLINIC_OR_DEPARTMENT_OTHER): Payer: Medicare Other | Admitting: Registered Nurse

## 2023-02-23 ENCOUNTER — Ambulatory Visit: Payer: Medicare Other | Admitting: Physical Medicine & Rehabilitation

## 2023-02-23 ENCOUNTER — Encounter: Payer: Self-pay | Admitting: Registered Nurse

## 2023-02-23 VITALS — BP 119/73 | HR 66 | Ht 64.5 in | Wt 185.0 lb

## 2023-02-23 DIAGNOSIS — G8929 Other chronic pain: Secondary | ICD-10-CM

## 2023-02-23 DIAGNOSIS — Z79891 Long term (current) use of opiate analgesic: Secondary | ICD-10-CM

## 2023-02-23 DIAGNOSIS — G894 Chronic pain syndrome: Secondary | ICD-10-CM

## 2023-02-23 DIAGNOSIS — Z5181 Encounter for therapeutic drug level monitoring: Secondary | ICD-10-CM

## 2023-02-23 DIAGNOSIS — M546 Pain in thoracic spine: Secondary | ICD-10-CM

## 2023-02-23 DIAGNOSIS — M1712 Unilateral primary osteoarthritis, left knee: Secondary | ICD-10-CM | POA: Diagnosis not present

## 2023-02-23 MED ORDER — MORPHINE SULFATE ER 15 MG PO TBCR: 15.0000 mg | EXTENDED_RELEASE_TABLET | Freq: Three times a day (TID) | ORAL | 0 refills | Status: DC

## 2023-02-23 NOTE — Progress Notes (Signed)
Subjective:    Patient ID: [name], female    DOB: [birth year], 72 y.o.   MRN: [id number]  HPI: [name] is a 72 y.o. female who returns for follow up appointment for chronic pain and medication refill. She states her pain is located in her mid-back and left knee pain. She rates her pain 3. Her current exercise regime is walking and performing stretching exercises.  [name] Morphine equivalent is 45.00 MME.   UDS ordered today.      Pain Inventory Average Pain 4 Pain Right Now 3 My pain is constant, burning, dull, and aching  In the last 24 hours, has pain interfered with the following? General activity 3 Relation with others 3 Enjoyment of life 3 What TIME of day is your pain at its worst? evening and night Sleep (in general) Good  Pain is worse with: walking, standing, and some activites Pain improves with: rest, heat/ice, and medication Relief from Meds: 5  Family History  Problem Relation Age of Onset   Breast cancer Mother    Diabetes Mother    Heart disease Mother    Allergies Mother    Diabetes Father    Heart disease Father    Emphysema Father    Allergies Father    Breast cancer Paternal Aunt    Colon cancer Neg Hx    Esophageal cancer Neg Hx    Rectal cancer Neg Hx    Stomach cancer Neg Hx    Social History   Socioeconomic History   Marital status: Widowed    Spouse name: Not on file   Number of children: Not on file   Years of education: Not on file   Highest education level: Not on file  Occupational History   Occupation: Catering manager   Occupation: retired  Tobacco Use   Smoking status: Never   Smokeless tobacco: Never   Tobacco comments:    Never smoke 01/13/22  Vaping Use   Vaping status: Never Used  Substance and Sexual Activity   Alcohol use: Yes    Alcohol/week: 1.0 standard drink of alcohol    Types: 1 Glasses of wine per week    Comment: rare   Drug use: Never   Sexual activity: Not Currently  Other Topics Concern    Not on file  Social History Narrative   Not on file   Social Determinants of Health   Financial Resource Strain: Low Risk  (01/19/2023)   Overall Financial Resource Strain (CARDIA)    Difficulty of Paying Living Expenses: Not hard at all  Food Insecurity: No Food Insecurity (01/19/2023)   Hunger Vital Sign    Worried About Running Out of Food in the Last Year: Never true    Ran Out of Food in the Last Year: Never true  Transportation Needs: No Transportation Needs (01/19/2023)   PRAPARE - Administrator, Civil Service (Medical): No    Lack of Transportation (Non-Medical): No  Physical Activity: Insufficiently Active (01/19/2023)   Exercise Vital Sign    Days of Exercise per Week: 7 days    Minutes of Exercise per Session: 20 min  Stress: No Stress Concern Present (01/19/2023)   [employer] of Occupational Health - Occupational Stress Questionnaire    Feeling of Stress : Not at all  Social Connections: Moderately Integrated (01/19/2023)   Social Connection and Isolation Panel [NHANES]    Frequency of Communication with Friends and Family: More than three times a week  Frequency of Social Gatherings with Friends and Family: More than three times a week    Attends Religious Services: More than 4 times per year    Active Member of Clubs or Organizations: Yes    Attends Banker Meetings: More than 4 times per year    Marital Status: Widowed   Past Surgical History:  Procedure Laterality Date   68 HOUR PH STUDY N/A 01/04/2016   Procedure: 24 HOUR PH STUDY;  Surgeon: Ruffin Frederick, MD;  Location: [facility];  Service: Gastroenterology;  Laterality: N/A;   ABDOMINAL HYSTERECTOMY  1988   ADENOIDECTOMY     ANTERIOR CERVICAL DECOMP/DISCECTOMY FUSION  2009, 1993   first procedure in ~ 1993/Dr Botero   APPENDECTOMY  1988   ATRIAL FIBRILLATION ABLATION N/A 12/16/2021   Procedure: ATRIAL FIBRILLATION ABLATION;  Surgeon: Lanier Prude, MD;   Location: [facility];  Service: Cardiovascular;  Laterality: N/A;   BACK SURGERY     CARDIOVERSION N/A 04/03/2019   Procedure: CARDIOVERSION;  Surgeon: Lars Masson, MD;  Location: [facility] ENDOSCOPY;  Service: Cardiovascular;  Laterality: N/A;   CHOLECYSTECTOMY  2005   ESOPHAGEAL MANOMETRY N/A 01/04/2016   Procedure: ESOPHAGEAL MANOMETRY (EM) 24 HR PH;  Surgeon: Ruffin Frederick, MD;  Location: [facility];  Service: Gastroenterology;  Laterality: N/A;   ESOPHAGOGASTRODUODENOSCOPY (EGD) WITH PROPOFOL N/A 05/17/2016   Procedure: ESOPHAGOGASTRODUODENOSCOPY (EGD) WITH PROPOFOL;  Surgeon: Ruffin Frederick, MD;  Location: [facility];  Service: Gastroenterology;  Laterality: N/A;   FOOT SURGERY Left 08/2022   NASAL SINUS SURGERY  2006   SAVORY DILATION N/A 05/17/2016   Procedure: SAVORY DILATION;  Surgeon: Ruffin Frederick, MD;  Location: [facility];  Service: Gastroenterology;  Laterality: N/A;   TEE WITHOUT CARDIOVERSION N/A 04/03/2019   Procedure: TRANSESOPHAGEAL ECHOCARDIOGRAM (TEE)-with DCCV;  Surgeon: Lars Masson, MD;  Location: [facility] ENDOSCOPY;; EF 55 to 60%.  No LVH.  Normal RV.  Moderate LA dilation.  No thrombus.  Mild RA dilation.  Mild to moderate TR.  Mild aortic valve sclerosis, no stenosis.  Moderately elevated PA pressures (47 mmHg)   TONSILLECTOMY AND ADENOIDECTOMY     TRANSTHORACIC ECHOCARDIOGRAM  2009   Normal LV size and function. EF 60-65%. No regional wall motion and amount is. Mild aortic sclerosis, no stenosis   TUBAL LIGATION  1979   Past Surgical History:  Procedure Laterality Date   15 HOUR PH STUDY N/A 01/04/2016   Procedure: 24 HOUR PH STUDY;  Surgeon: Ruffin Frederick, MD;  Location: [facility];  Service: Gastroenterology;  Laterality: N/A;   ABDOMINAL HYSTERECTOMY  1988   ADENOIDECTOMY     ANTERIOR CERVICAL DECOMP/DISCECTOMY FUSION  2009, 1993   first procedure in ~ 1993/Dr Botero   APPENDECTOMY  1988   ATRIAL  FIBRILLATION ABLATION N/A 12/16/2021   Procedure: ATRIAL FIBRILLATION ABLATION;  Surgeon: Lanier Prude, MD;  [facility]: [facility];  Service: Cardiovascular;  Laterality: N/A;   BACK SURGERY     CARDIOVERSION N/A 04/03/2019   Procedure: CARDIOVERSION;  Surgeon: Lars Masson, MD;  Location: [facility] ENDOSCOPY;  Service: Cardiovascular;  Laterality: N/A;   CHOLECYSTECTOMY  2005   ESOPHAGEAL MANOMETRY N/A 01/04/2016   Procedure: ESOPHAGEAL MANOMETRY (EM) 24 HR PH;  Surgeon: Ruffin Frederick, MD;  Location: [facility];  Service: Gastroenterology;  Laterality: N/A;   ESOPHAGOGASTRODUODENOSCOPY (EGD) WITH PROPOFOL N/A 05/17/2016   Procedure: ESOPHAGOGASTRODUODENOSCOPY (EGD) WITH PROPOFOL;  Surgeon: Ruffin Frederick, MD;  Location: [facility];  Service: Gastroenterology;  Laterality: N/A;   FOOT SURGERY Left 08/2022   NASAL SINUS SURGERY  2006   SAVORY DILATION N/A 05/17/2016   Procedure: SAVORY DILATION;  Surgeon: Ruffin Frederick, MD;  Location: [facility];  Service: Gastroenterology;  Laterality: N/A;   TEE WITHOUT CARDIOVERSION N/A 04/03/2019   Procedure: TRANSESOPHAGEAL ECHOCARDIOGRAM (TEE)-with DCCV;  Surgeon: Lars Masson, MD;  Location: [facility] ENDOSCOPY;; EF 55 to 60%.  No LVH.  Normal RV.  Moderate LA dilation.  No thrombus.  Mild RA dilation.  Mild to moderate TR.  Mild aortic valve sclerosis, no stenosis.  Moderately elevated PA pressures (47 mmHg)   TONSILLECTOMY AND ADENOIDECTOMY     TRANSTHORACIC ECHOCARDIOGRAM  2009   Normal LV size and function. EF 60-65%. No regional wall motion and amount is. Mild aortic sclerosis, no stenosis   TUBAL LIGATION  1979   Past Medical History:  Diagnosis Date   B12 deficiency    Complication of anesthesia    slow to wake up    Depression    Diabetes mellitus type 2, diet-controlled (HCC) 07/24/2018   Fibromyalgia    Generalized hyperhidrosis 04/17/2014   GERD (gastroesophageal reflux disease)    History of  rheumatic fever as a child    Hyperlipemia    Hypertension    denies htn on 05/09/16   HYPOTHYROIDISM 05/29/2007   Irritable bowel syndrome    Migraines    hx of    OSA (obstructive sleep apnea) 06/26/2019   Senile calcific aortic valve sclerosis    Aortic Sclerosis-no stenosis   BP 119/73   Pulse 66   Ht 5' 4.5" (1.638 m)   Wt 185 lb (83.9 kg)   SpO2 98%   BMI 31.26 kg/m   Opioid Risk Score:   Fall Risk Score:  `1  Depression screen [facility] 2/9     02/23/2023   10:38 AM 01/19/2023    1:43 PM 10/25/2022    1:08 PM 09/28/2022    2:07 PM 08/29/2022    1:54 PM 08/04/2022   12:54 PM 07/06/2022    1:19 PM  Depression screen PHQ 2/9  Decreased Interest 0 0 0 0 0 0 0  Down, Depressed, Hopeless 0 0 0 0 0 0 0  PHQ - 2 Score 0 0 0 0 0 0 0    Review of Systems  Musculoskeletal:  Positive for back pain and gait problem.       Left knee & left lower leg pain  All other systems reviewed and are negative.      Objective:   Physical Exam Vitals and nursing note reviewed.  Constitutional:      Appearance: Normal appearance.  Cardiovascular:     Rate and Rhythm: Normal rate and regular rhythm.     Pulses: Normal pulses.     Heart sounds: Normal heart sounds.  Pulmonary:     Effort: Pulmonary effort is normal.     Breath sounds: Normal breath sounds.  Musculoskeletal:     Cervical back: Normal range of motion and neck supple.     Comments: Normal Muscle Bulk and Muscle Testing Reveals:  Upper Extremities: Full ROM and Muscle Strength 5/5 Thoracic Paraspinal Tenderness: T-7-T-9 Lower Extremities: Full ROM and Muscle Strength 5/5 Arises from Table with ease Narrow Based  Gait     Skin:    General: Skin is warm and dry.  Neurological:     Mental Status: She is alert and oriented to person, place, and time.  Psychiatric:        Mood and Affect: Mood normal.        Behavior: Behavior normal.         Assessment & Plan:  1. Cervical postlaminectomy syndrome:/ Cervicalgia/  Cervical Radiculitis/ with neck and shoulder pain/ Chronic Midline Thoracic Pain.  Continue exercise routine and using heat therapy. 02/23/2023 Continue : current medication regimen. Refilled: MS Contin 15 mg one tablet three times a day #90.   We will continue the opioid monitoring program, this consists of regular clinic visits, examinations, urine drug screen, pill counts as well as use of [place] Controlled Substance Reporting system. A 12 month History has been reviewed on the [place] Controlled Substance Reporting System on 02/23/2023.   2. Fibromyalgia/ Neuropathy: Continue current medication regimen with Gabapentin, activity and exercise regime. 02/23/2023. 3. Left Knee Pain/ Mild Degenerative Changes S/P : on 12/06/2022 with Dr Wynn Banker.   LEFT Genicular nerve block x 3, Upper medial, Upper lateral , and Lower Medial under fluoroscopic guidance    On 02/09/2023: S/P LEFT KNEE Genicular nerve Radiofrequency neurotomy x 3 nerves, Upper medial, Upper lateral , and Lower Medial under fluoroscopic guidance . Continue current medication regimen with Voltaren Gel. 02/23/2023. 4. Bilateral  Ankle Pain:  No complaints today. Continue HEP as tolerated. Continue to monitor.02/23/2023. 5. Muscle Spasm: Continue current medication regimen with  Robaxin. Continue to Monitor.02/23/2023.. 6. Opioid Induced Constipation: No complaints Continue to Monitor. 02/23/2023. 7. Midline Low Back Pain: Continue current medication regime, continue HEP as tolerated. 02/23/2023. 8. Right Gluteal Medius Weakness and Atrophy: No complaints Today:Continue with Hip Abduction Exercises as tolerated: Continue to Monitor. 02/23/2023. 9. Right Hip Pain/ Right Greater Trochanteric Tenderness: No complaints today. Continue to Alternate Ice and Heat Therapy. Continue to Monitor. 02/23/2023  10. Sacroiliac Joint Pain:  S/P Sacroiliac Injection with good relief noted on 11/10/2022. Continue to monitor.  11. Lumbar  Radiculitis: No complaints today.Continue Gabapentin. Continue HEP as tolerated. Continue current medication regimen. Continue to monitor.  02/23/2023.     F/U in 1 month

## 2023-02-24 ENCOUNTER — Ambulatory Visit: Payer: Medicare Other | Admitting: Registered Nurse

## 2023-03-02 LAB — TOXASSURE SELECT,+ANTIDEPR,UR

## 2023-03-13 ENCOUNTER — Encounter: Payer: Self-pay | Admitting: Physical Medicine & Rehabilitation

## 2023-03-14 NOTE — Progress Notes (Unsigned)
Cardiology Office Note Date:  03/15/2023  Patient ID:  Lemma, Trainer 1951/06/18, MRN [id number] PCP:  Hannah Beat, MD  Cardiologist:  Bryan Lemma, MD Electrophysiologist: Lanier Prude, MD    Chief Complaint: afib recurrence  History of Present Illness: [name] is a 72 y.o. female with PMH notable for persis Afib, HTN, hypothyroid, fibromyalgia, T2DM; seen today for Lanier Prude, MD for acute visit due to afib.   She is s/p afib ablation 11/2021 with PVI and posterior wall. She last saw Dr. Lalla Brothers 08/2022 for routine follow-up. Feeling well without recurrence of afib, dilt and eliquis continued.   On follow-up today, she has done very well until a few days ago. She had an episode of AFib with palpitations, HR to 140s. She remembers that in the past she used to take 30mg  dilt and flecainide PRN with episodes, so she took them both and episode subsided about 30 minutes later. Her BP was also elevated during episode to 159/88. BPs usually run 120s/70s She has had no further episodes.  She denies ETOH, nicotine use. She was previously diagnosed with OSA and used to try to use CPAP but could never get acclimated to machine and so stopped using.   Separately, she c/o intermittent central chest pain. Describes pain as "uncomfortable", no SOB, sweating, jaw/shoulder pain with chest pain episodes. Chest pain does not limit her activity, she just notices it.  She also separately has intermittent L hand/wrist numbness.   She has two puppies and is active with them. Recently had L foot surgery and has had chronic L lower leg edema since. Edema is stable. Diligently takes eliquis BID, no bleeding concerns.   AAD History: Flecainide - stopped d/t no afib after ablation  Past Medical History:  Diagnosis Date   B12 deficiency    Complication of anesthesia    slow to wake up    Depression    Diabetes mellitus type 2, diet-controlled (HCC) 07/24/2018   Fibromyalgia     Generalized hyperhidrosis 04/17/2014   GERD (gastroesophageal reflux disease)    History of rheumatic fever as a child    Hyperlipemia    Hypertension    denies htn on 05/09/16   HYPOTHYROIDISM 05/29/2007   Irritable bowel syndrome    Migraines    hx of    OSA (obstructive sleep apnea) 06/26/2019   Senile calcific aortic valve sclerosis    Aortic Sclerosis-no stenosis    Past Surgical History:  Procedure Laterality Date   26 HOUR PH STUDY N/A 01/04/2016   Procedure: 24 HOUR PH STUDY;  Surgeon: Ruffin Frederick, MD;  Location: [facility];  Service: Gastroenterology;  Laterality: N/A;   ABDOMINAL HYSTERECTOMY  1988   ADENOIDECTOMY     ANTERIOR CERVICAL DECOMP/DISCECTOMY FUSION  2009, 1993   first procedure in ~ 1993/Dr Botero   APPENDECTOMY  1988   ATRIAL FIBRILLATION ABLATION N/A 12/16/2021   Procedure: ATRIAL FIBRILLATION ABLATION;  Surgeon: Lanier Prude, MD;  Location: [facility];  Service: Cardiovascular;  Laterality: N/A;   BACK SURGERY     CARDIOVERSION N/A 04/03/2019   Procedure: CARDIOVERSION;  Surgeon: Lars Masson, MD;  Location: [facility] ENDOSCOPY;  Service: Cardiovascular;  Laterality: N/A;   CHOLECYSTECTOMY  2005   ESOPHAGEAL MANOMETRY N/A 01/04/2016   Procedure: ESOPHAGEAL MANOMETRY (EM) 24 HR PH;  Surgeon: Ruffin Frederick, MD;  Location: [facility];  Service: Gastroenterology;  Laterality: N/A;   ESOPHAGOGASTRODUODENOSCOPY (EGD) WITH PROPOFOL N/A 05/17/2016  Procedure: ESOPHAGOGASTRODUODENOSCOPY (EGD) WITH PROPOFOL;  Surgeon: Ruffin Frederick, MD;  Location: [facility];  Service: Gastroenterology;  Laterality: N/A;   FOOT SURGERY Left 08/2022   NASAL SINUS SURGERY  2006   SAVORY DILATION N/A 05/17/2016   Procedure: SAVORY DILATION;  Surgeon: Ruffin Frederick, MD;  Location: [facility];  Service: Gastroenterology;  Laterality: N/A;   TEE WITHOUT CARDIOVERSION N/A 04/03/2019   Procedure: TRANSESOPHAGEAL ECHOCARDIOGRAM  (TEE)-with DCCV;  Surgeon: Lars Masson, MD;  Location: [facility] ENDOSCOPY;; EF 55 to 60%.  No LVH.  Normal RV.  Moderate LA dilation.  No thrombus.  Mild RA dilation.  Mild to moderate TR.  Mild aortic valve sclerosis, no stenosis.  Moderately elevated PA pressures (47 mmHg)   TONSILLECTOMY AND ADENOIDECTOMY     TRANSTHORACIC ECHOCARDIOGRAM  2009   Normal LV size and function. EF 60-65%. No regional wall motion and amount is. Mild aortic sclerosis, no stenosis   TUBAL LIGATION  1979    Current Outpatient Medications  Medication Instructions   acetaminophen (TYLENOL) 650 mg, Oral, 2 times daily   ascorbic acid (CVS VITAMIN C) 500 MG tablet    B Complex Vitamins (VITAMIN B COMPLEX 100 IJ)    cetirizine (ZYRTEC) 10 mg, Oral, Daily at bedtime   clindamycin (CLEOCIN T) 1 % lotion 1 application , Topical, Daily PRN   Coenzyme Q-10 100 mg, Oral, Every morning   Cyanocobalamin (B-12) 1000 MCG TABS    diclofenac sodium (VOLTAREN) 4 g, Topical, 4 times daily PRN   diltiazem (CARDIZEM CD) 240 MG 24 hr capsule TAKE 1 CAPSULE BY MOUTH EVERY DAY   diltiazem (CARDIZEM) 30 MG tablet Take one tablet by mouth every 4 hours as needed for heart rate greater than 100, top number blood pressure needs to be above 100   docusate sodium (COLACE) 250 mg, Oral, Daily at bedtime   Eliquis 5 mg, Oral, 2 times daily   flecainide (TAMBOCOR) 50 MG tablet    fluocinonide (LIDEX) 0.05 % external solution 1 application , Topical, Daily PRN   gabapentin (NEURONTIN) 300 MG capsule TAKE 1 CAPSULE BY MOUTH 3 TIMES  DAILY   hydroxypropyl methylcellulose / hypromellose (ISOPTO TEARS / GONIOVISC) 2.5 % ophthalmic solution 1-2 drops, Both Eyes, 3 times daily PRN   levothyroxine (SYNTHROID) 50 MCG tablet TAKE 1 TABLET BY MOUTH DAILY   loratadine (CLARITIN) 10 mg, Oral, Every morning   Magnesium Bisglycinate (MAG GLYCINATE PO) 400 mg, Oral, Daily at bedtime   methocarbamol (ROBAXIN) 500 mg, Oral, Every 8 hours PRN   morphine (MS  CONTIN) 15 mg, Oral, 3 times daily   oxybutynin (DITROPAN-XL) 10 MG 24 hr tablet TAKE 1 TABLET BY MOUTH AT  BEDTIME   oxymetazoline (AFRIN) 0.05 % nasal spray 1 spray, Each Nare, 2 times daily PRN   RABEprazole (ACIPHEX) 20 MG tablet Every evening   Resveratrol 100 mg, Oral, 2 times daily   tretinoin (RETIN-A) 0.05 % cream 1 application , Topical, Daily PRN   TYRVAYA 0.03 MG/ACT SOLN 1 spray, Daily at bedtime   Vitamin D 5,000 Units, Oral, Every morning   Wheat Dextrin (BENEFIBER) CHEW 2 tablets, Oral, 2 times daily PRN    Social History:  The patient  reports that she has never smoked. She has never used smokeless tobacco. She reports current alcohol use of about 1.0 standard drink of alcohol per week. She reports that she does not use drugs.   Family History:  The patient's family history includes Allergies in her  father and mother; Breast cancer in her mother and paternal aunt; Diabetes in her father and mother; Emphysema in her father; Heart disease in her father and mother.  ROS:  Please see the history of present illness. All other systems are reviewed and otherwise negative.   PHYSICAL EXAM:  VS:  BP (!) 134/58 (BP Location: Left Arm, Patient Position: Sitting, Cuff Size: Normal)   Pulse 75   Ht 5' 4.5" (1.638 m)   Wt 186 lb 6.4 oz (84.6 kg)   SpO2 95%   BMI 31.50 kg/m  BMI: Body mass index is 31.5 kg/m.  GEN- The patient is well appearing, alert and oriented x 3 today.   Lungs- Clear to ausculation bilaterally, normal work of breathing.  Heart- Regular rate and rhythm, no murmurs, rubs or gallops Extremities- 1+ L>R peripheral edema, warm, dry   EKG is ordered. Personal review of EKG from today shows:     EKG Interpretation Date/Time:  Wednesday March 15 2023 14:44:13 EDT Ventricular Rate:  75 PR Interval:  140 QRS Duration:  86 QT Interval:  380 QTC Calculation: 424 R Axis:   51  Text Interpretation: Normal sinus rhythm Normal ECG Confirmed by Sherie Don  [phone]) on 03/15/2023 2:49:27 PM    Recent Labs: 10/03/2022: ALT 18; BUN 14; Creatinine, Ser 0.65; Hemoglobin 12.5; Platelets 281.0; Potassium 4.0; Sodium 141; TSH 2.39  10/03/2022: Cholesterol 161; HDL 59.20; LDL Cholesterol 82; Total CHOL/HDL Ratio 3; Triglycerides 101.0; VLDL 20.2   CrCl cannot be calculated (Patient's most recent lab result is older than the maximum 21 days allowed.).   Wt Readings from Last 3 Encounters:  03/15/23 186 lb 6.4 oz (84.6 kg)  02/23/23 185 lb (83.9 kg)  01/19/23 178 lb (80.7 kg)     Additional studies reviewed include: Previous EP, cardiology notes.   ETT, 09/25/2019 Exercise time: 3 min 37 seconds Workload: 5.3 METS Test stopped due to: SOB BP response: Elevated without hypertensive response HR response: 88% maximal heart rate Rhythm: sinus rhythm, one PAC in recovery   Patient on flecanide. No change in QRS duration with exercise.  No ischemia on stress ECG at low workload.  TEE, 04/03/2019  1. Left ventricular ejection fraction, by visual estimation, is 55 to 60%. The left ventricle has normal function. Normal left ventricular size. There is no left ventricular hypertrophy.   2. Global right ventricle has normal systolic function.The right ventricular size is normal. No increase in right ventricular wall thickness.   3. Left atrial size was moderately dilated.   4. No thrombus is seen in the left atrium or left atrial appendage.   5. Right atrial size was mildly dilated.   6. The mitral valve is normal in structure. Mild mitral valve regurgitation. No evidence of mitral stenosis.   7. The tricuspid valve is normal in structure. Tricuspid valve regurgitation mild-moderate.   8. The aortic valve is normal in structure. Aortic valve regurgitation is mild by color flow Doppler. Mild aortic valve sclerosis without stenosis.   9. The pulmonic valve was normal in structure. Pulmonic valve regurgitation is not visualized by color flow Doppler.  10.  Moderately elevated pulmonary artery systolic pressure.  11. The tricuspid regurgitant velocity is 3.12 m/s, and with an assumed right atrial pressure of 8 mmHg, the estimated right ventricular systolic pressure is moderately elevated at 46.9 mmHg.  12. The inferior vena cava is normal in size with greater than 50% respiratory variability, suggesting right atrial pressure of 3 mmHg.  13.  No thrombus is seen in the left atrium or left atrial appendage.  14. This TEE was followed by a successful cardioversion.   CT coronary, 07/11/2018 1. Coronary artery calcium score 63 Agat[place], placing the patient in the 74th percentile for age and gender. This suggests intermediate risk for future cardiac events. 2.  Nonobstructive coronary disease.  ASSESSMENT AND PLAN:  #) persis Afib #) typical aflutter #) palpitations Had recent episode of afib after going many months without episode Took flecainide from previous Rx and 30mg  dilt and episode stopped Recommended that she no longer take flecainide as it is not safe to take PRN at this time Continue 240mg  dilt daily + 30mg  dilt PRN for afib episodes Update electrolytes, CBC, thyroid labs Two week zio to further eval burden  #) Hypercoag d/t persis afib CHA2DS2-VASc Score = 4 [CHF History: 0, HTN History: 1, Diabetes History: 1, Stroke History: 0, Vascular Disease History: 0, Age Score: 1, Gender Score: 1].  Therefore, the patient's annual risk of stroke is 4.8 %. Stroke ppx - 5mg  eliquis BID, appropriately dosed No bleeding concerns  #) Chest pain Intermittent at rest and with activity No correlating symptoms of SOB, diaphoresis, N/V, jaw pain Most recent lipid panel 09/2022 nl 2020 coronary CTA with non-obstructive CAD Recommend follow-up with gen cards (Dr. Herbie Baltimore) for further eval  #) HTN Well controlled in office Continue 240mg  dilt daily  #) OSA Previously unable to tolerate CPAP machine though has had significant weight loss since that  time Referral to pulm for further eval      Current medicines are reviewed at length with the patient today.   The patient does not have concerns regarding her medicines.  The following changes were made today:  none  Labs/ tests ordered today include:  Orders Placed This Encounter  Procedures   CBC   Basic metabolic panel   Magnesium   TSH   Ambulatory referral to Pulmonology   LONG TERM MONITOR (3-14 DAYS)   EKG 12-Lead     Disposition: Follow up with EP APP  4-6 weeks    Signed, Sherie Don, NP  03/15/23  3:23 PM  Electrophysiology [facility]

## 2023-03-15 ENCOUNTER — Ambulatory Visit: Payer: Medicare Other | Attending: Cardiology | Admitting: Cardiology

## 2023-03-15 ENCOUNTER — Ambulatory Visit: Payer: Medicare Other

## 2023-03-15 ENCOUNTER — Encounter: Payer: Self-pay | Admitting: Cardiology

## 2023-03-15 VITALS — BP 134/58 | HR 75 | Ht 64.5 in | Wt 186.4 lb

## 2023-03-15 DIAGNOSIS — I10 Essential (primary) hypertension: Secondary | ICD-10-CM

## 2023-03-15 DIAGNOSIS — G4733 Obstructive sleep apnea (adult) (pediatric): Secondary | ICD-10-CM

## 2023-03-15 DIAGNOSIS — I483 Typical atrial flutter: Secondary | ICD-10-CM

## 2023-03-15 DIAGNOSIS — D6869 Other thrombophilia: Secondary | ICD-10-CM

## 2023-03-15 DIAGNOSIS — I4819 Other persistent atrial fibrillation: Secondary | ICD-10-CM | POA: Diagnosis not present

## 2023-03-15 MED ORDER — DILTIAZEM HCL 30 MG PO TABS: ORAL_TABLET | ORAL | 1 refills | Status: DC

## 2023-03-15 NOTE — Patient Instructions (Signed)
Medication Instructions:  The current medical regimen is effective;  continue present plan and medications.  *If you need a refill on your cardiac medications before your next appointment, please call your pharmacy*   Lab Work: Your provider would like for you to have following labs drawn today CBC, BMET, MAG, TSH.   If you have labs (blood work) drawn today and your tests are completely normal, you will receive your results only by: MyChart Message (if you have MyChart) OR A paper copy in the mail If you have any lab test that is abnormal or we need to change your treatment, we will call you to review the results.   Testing/Procedures:  [name]- Long Term Monitor Instructions  Your physician has requested you wear a ZIO patch monitor for 14 days.  This is a single patch monitor. [employer] supplies one patch monitor per enrollment. Additional stickers are not available. Please do not apply patch if you will be having a Nuclear Stress Test,  Echocardiogram, Cardiac CT, MRI, or Chest Xray during the period you would be wearing the  monitor. The patch cannot be worn during these tests. You cannot remove and re-apply the  ZIO XT patch monitor.  Your ZIO patch monitor will be mailed 3 day USPS to your address on file. It may take 3-5 days  to receive your monitor after you have been enrolled.  Once you have received your monitor, please review the enclosed instructions. Your monitor  has already been registered assigning a specific monitor serial # to you.  Billing and Patient Assistance Program Information  We have supplied [employer] with any of your insurance information on file for billing purposes. [employer] offers a sliding scale Patient Assistance Program for patients that do not have  insurance, or whose insurance does not completely cover the cost of the ZIO monitor.  You must apply for the Patient Assistance Program to qualify for this discounted rate.  To apply, please call [employer]  at [phone], select option 4, select option 2, ask to apply for  Patient Assistance Program. [name] will ask your household income, and how many people  are in your household. They will quote your out-of-pocket cost based on that information.  [employer] will also be able to set up a 53-month, interest-free payment plan if needed.  Applying the monitor   Shave hair from upper left chest.  Hold abrader disc by orange tab. Rub abrader in 40 strokes over the upper left chest as  indicated in your monitor instructions.  Clean area with 4 enclosed alcohol pads. Let dry.  Apply patch as indicated in monitor instructions. Patch will be placed under collarbone on left  side of chest with arrow pointing upward.  Rub patch adhesive wings for 2 minutes. Remove white label marked "1". Remove the white  label marked "2". Rub patch adhesive wings for 2 additional minutes.  While looking in a mirror, press and release button in center of patch. A small green light will  flash 3-4 times. This will be your only indicator that the monitor has been turned on.  Do not shower for the first 24 hours. You may shower after the first 24 hours.  Press the button if you feel a symptom. You will hear a small click. Record Date, Time and  Symptom in the Patient Logbook.  When you are ready to remove the patch, follow instructions on the last 2 pages of Patient  Logbook. Stick patch monitor onto the last page of  Patient Logbook.  Place Patient Logbook in the blue and white box. Use locking tab on box and tape box closed  securely. The blue and white box has prepaid postage on it. Please place it in the mailbox as  soon as possible. Your physician should have your test results approximately 7 days after the  monitor has been mailed back to [name].  Call [name] Customer Care at [phone] if you have questions regarding  your ZIO XT patch monitor. Call them immediately if you see an orange light  blinking on your  monitor.  If your monitor falls off in less than 4 days, contact our Monitor department at [phone].  If your monitor becomes loose or falls off after 4 days call [employer] at [phone] for  suggestions on securing your monitor'   Follow-Up: At [facility], you and your health needs are our priority.  As part of our continuing mission to provide you with exceptional heart care, we have created designated Provider Care Teams.  These Care Teams include your primary Cardiologist (physician) and Advanced Practice Providers (APPs -  Physician Assistants and Nurse Practitioners) who all work together to provide you with the care you need, when you need it.  We recommend signing up for the patient portal called "MyChart".  Sign up information is provided on this After Visit Summary.  MyChart is used to connect with patients for Virtual Visits (Telemedicine).  Patients are able to view lab/test results, encounter notes, upcoming appointments, etc.  Non-urgent messages can be sent to your provider as well.   To learn more about what you can do with MyChart, go to [email or web address].    Your next appointment:   4 week(s)  Provider:   Sherie Don, NP  We will schedule you to see Dr.Harding- next opening.    Other Instructions  Referral to Pulmonology- they will contact you for an appointment.

## 2023-03-16 LAB — CBC
Hematocrit: 35.7 % (ref 34.0–46.6)
Hemoglobin: 12.1 g/dL (ref 11.1–15.9)
MCH: 30.6 pg (ref 26.6–33.0)
MCHC: 33.9 g/dL (ref 31.5–35.7)
MCV: 90 fL (ref 79–97)
Platelets: 236 10*3/uL (ref 150–450)
RBC: 3.95 x10E6/uL (ref 3.77–5.28)
RDW: 11.8 % (ref 11.7–15.4)
WBC: 6 10*3/uL (ref 3.4–10.8)

## 2023-03-16 LAB — BASIC METABOLIC PANEL
BUN/Creatinine Ratio: 17 (ref 12–28)
BUN: 12 mg/dL (ref 8–27)
CO2: 24 mmol/L (ref 20–29)
Calcium: 10 mg/dL (ref 8.7–10.3)
Chloride: 101 mmol/L (ref 96–106)
Creatinine, Ser: 0.72 mg/dL (ref 0.57–1.00)
Glucose: 145 mg/dL — ABNORMAL HIGH (ref 70–99)
Potassium: 4.1 mmol/L (ref 3.5–5.2)
Sodium: 143 mmol/L (ref 134–144)
eGFR: 89 mL/min/{1.73_m2} (ref >59)

## 2023-03-16 LAB — TSH: TSH: 2.26 u[IU]/mL (ref 0.450–4.500)

## 2023-03-16 LAB — MAGNESIUM: Magnesium: 1.8 mg/dL (ref 1.6–2.3)

## 2023-03-18 DIAGNOSIS — D6869 Other thrombophilia: Secondary | ICD-10-CM

## 2023-03-18 DIAGNOSIS — I483 Typical atrial flutter: Secondary | ICD-10-CM

## 2023-03-18 DIAGNOSIS — I4819 Other persistent atrial fibrillation: Secondary | ICD-10-CM

## 2023-03-18 DIAGNOSIS — I10 Essential (primary) hypertension: Secondary | ICD-10-CM | POA: Diagnosis not present

## 2023-03-18 DIAGNOSIS — G4733 Obstructive sleep apnea (adult) (pediatric): Secondary | ICD-10-CM

## 2023-03-24 ENCOUNTER — Telehealth: Payer: Self-pay | Admitting: Family Medicine

## 2023-03-24 NOTE — Telephone Encounter (Signed)
This is fine.  Please do so.

## 2023-03-24 NOTE — Telephone Encounter (Signed)
Called pharmacy gave ok to make change will call if any further questions.

## 2023-03-24 NOTE — Telephone Encounter (Signed)
Pharmacy contacted the office requesting verbal approval to changer manufacturer on medication levothyroxine (SYNTHROID) 50 MCG tablet . Caller says they need an okay from provider to change this, a verbal okay will do fine. Can be reached at [phone]

## 2023-03-29 NOTE — Progress Notes (Unsigned)
Subjective:    Patient ID: [name], female    DOB: 1950/10/11, 72 y.o.   MRN: [id number]  HPI: [name] is a 72 y.o. female who returns for follow up appointment for chronic pain and medication refill. She states her pain is located in her mid- back and left knee. She rates her pain 3. Her current exercise regime is walking and performing stretching exercises.  [name] Morphine equivalent is 45.00 MME.   Last UDS was Performed on 02/23/2023, it was consistent.     Pain Inventory Average Pain 4 Pain Right Now 3 My pain is constant, burning, dull, and aching  In the last 24 hours, has pain interfered with the following? General activity 3 Relation with others 3 Enjoyment of life 3 What TIME of day is your pain at its worst? evening and night Sleep (in general) Good  Pain is worse with: walking, bending, standing, and some activites Pain improves with: rest, heat/ice, and medication Relief from Meds: 5  Family History  Problem Relation Age of Onset   Breast cancer Mother    Diabetes Mother    Heart disease Mother    Allergies Mother    Diabetes Father    Heart disease Father    Emphysema Father    Allergies Father    Breast cancer Paternal Aunt    Colon cancer Neg Hx    Esophageal cancer Neg Hx    Rectal cancer Neg Hx    Stomach cancer Neg Hx    Social History   Socioeconomic History   Marital status: Widowed    Spouse name: Not on file   Number of children: Not on file   Years of education: Not on file   Highest education level: Not on file  Occupational History   Occupation: Catering manager   Occupation: retired  Tobacco Use   Smoking status: Never   Smokeless tobacco: Never   Tobacco comments:    Never smoke 01/13/22  Vaping Use   Vaping status: Never Used  Substance and Sexual Activity   Alcohol use: Yes    Alcohol/week: 1.0 standard drink of alcohol    Types: 1 Glasses of wine per week    Comment: rare   Drug use: Never   Sexual activity:  Not Currently  Other Topics Concern   Not on file  Social History Narrative   Not on file   Social Determinants of Health   Financial Resource Strain: Low Risk  (01/19/2023)   Overall Financial Resource Strain (CARDIA)    Difficulty of Paying Living Expenses: Not hard at all  Food Insecurity: No Food Insecurity (01/19/2023)   Hunger Vital Sign    Worried About Running Out of Food in the Last Year: Never true    Ran Out of Food in the Last Year: Never true  Transportation Needs: No Transportation Needs (01/19/2023)   PRAPARE - Administrator, Civil Service (Medical): No    Lack of Transportation (Non-Medical): No  Physical Activity: Insufficiently Active (01/19/2023)   Exercise Vital Sign    Days of Exercise per Week: 7 days    Minutes of Exercise per Session: 20 min  Stress: No Stress Concern Present (01/19/2023)   [employer] of Occupational Health - Occupational Stress Questionnaire    Feeling of Stress : Not at all  Social Connections: Moderately Integrated (01/19/2023)   Social Connection and Isolation Panel [NHANES]    Frequency of Communication with Friends and Family: More than  three times a week    Frequency of Social Gatherings with Friends and Family: More than three times a week    Attends Religious Services: More than 4 times per year    Active Member of Clubs or Organizations: Yes    Attends Banker Meetings: More than 4 times per year    Marital Status: Widowed   Past Surgical History:  Procedure Laterality Date   95 HOUR PH STUDY N/A 01/04/2016   Procedure: 24 HOUR PH STUDY;  Surgeon: Ruffin Frederick, MD;  Location: [facility];  Service: Gastroenterology;  Laterality: N/A;   ABDOMINAL HYSTERECTOMY  1988   ADENOIDECTOMY     ANTERIOR CERVICAL DECOMP/DISCECTOMY FUSION  2009, 1993   first procedure in ~ 1993/Dr Botero   APPENDECTOMY  1988   ATRIAL FIBRILLATION ABLATION N/A 12/16/2021   Procedure: ATRIAL FIBRILLATION ABLATION;   Surgeon: Lanier Prude, MD;  Location: [facility];  Service: Cardiovascular;  Laterality: N/A;   BACK SURGERY     CARDIOVERSION N/A 04/03/2019   Procedure: CARDIOVERSION;  Surgeon: Lars Masson, MD;  Location: [facility] ENDOSCOPY;  Service: Cardiovascular;  Laterality: N/A;   CHOLECYSTECTOMY  2005   ESOPHAGEAL MANOMETRY N/A 01/04/2016   Procedure: ESOPHAGEAL MANOMETRY (EM) 24 HR PH;  Surgeon: Ruffin Frederick, MD;  Location: [facility];  Service: Gastroenterology;  Laterality: N/A;   ESOPHAGOGASTRODUODENOSCOPY (EGD) WITH PROPOFOL N/A 05/17/2016   Procedure: ESOPHAGOGASTRODUODENOSCOPY (EGD) WITH PROPOFOL;  Surgeon: Ruffin Frederick, MD;  Location: [facility];  Service: Gastroenterology;  Laterality: N/A;   FOOT SURGERY Left 08/2022   NASAL SINUS SURGERY  2006   SAVORY DILATION N/A 05/17/2016   Procedure: SAVORY DILATION;  Surgeon: Ruffin Frederick, MD;  Location: [facility];  Service: Gastroenterology;  Laterality: N/A;   TEE WITHOUT CARDIOVERSION N/A 04/03/2019   Procedure: TRANSESOPHAGEAL ECHOCARDIOGRAM (TEE)-with DCCV;  Surgeon: Lars Masson, MD;  Location: [facility] ENDOSCOPY;; EF 55 to 60%.  No LVH.  Normal RV.  Moderate LA dilation.  No thrombus.  Mild RA dilation.  Mild to moderate TR.  Mild aortic valve sclerosis, no stenosis.  Moderately elevated PA pressures (47 mmHg)   TONSILLECTOMY AND ADENOIDECTOMY     TRANSTHORACIC ECHOCARDIOGRAM  2009   Normal LV size and function. EF 60-65%. No regional wall motion and amount is. Mild aortic sclerosis, no stenosis   TUBAL LIGATION  1979   Past Surgical History:  Procedure Laterality Date   72 HOUR PH STUDY N/A 01/04/2016   Procedure: 24 HOUR PH STUDY;  Surgeon: Ruffin Frederick, MD;  Location: [facility];  Service: Gastroenterology;  Laterality: N/A;   ABDOMINAL HYSTERECTOMY  1988   ADENOIDECTOMY     ANTERIOR CERVICAL DECOMP/DISCECTOMY FUSION  2009, 1993   first procedure in ~ 1993/Dr Botero    APPENDECTOMY  1988   ATRIAL FIBRILLATION ABLATION N/A 12/16/2021   Procedure: ATRIAL FIBRILLATION ABLATION;  Surgeon: Lanier Prude, MD;  Location: [facility];  Service: Cardiovascular;  Laterality: N/A;   BACK SURGERY     CARDIOVERSION N/A 04/03/2019   Procedure: CARDIOVERSION;  Surgeon: Lars Masson, MD;  Location: [facility] ENDOSCOPY;  Service: Cardiovascular;  Laterality: N/A;   CHOLECYSTECTOMY  2005   ESOPHAGEAL MANOMETRY N/A 01/04/2016   Procedure: ESOPHAGEAL MANOMETRY (EM) 24 HR PH;  Surgeon: Ruffin Frederick, MD;  Location: [facility];  Service: Gastroenterology;  Laterality: N/A;   ESOPHAGOGASTRODUODENOSCOPY (EGD) WITH PROPOFOL N/A 05/17/2016   Procedure: ESOPHAGOGASTRODUODENOSCOPY (EGD) WITH PROPOFOL;  Surgeon: Viviann Spare  Maylon Peppers, MD;  Location: [facility];  Service: Gastroenterology;  Laterality: N/A;   FOOT SURGERY Left 08/2022   NASAL SINUS SURGERY  2006   SAVORY DILATION N/A 05/17/2016   Procedure: SAVORY DILATION;  Surgeon: Ruffin Frederick, MD;  Location: [facility]OS[facility];  Service: Gastroenterology;  Laterality: N/A;   TEE WITHOUT CARDIOVERSION N/A 04/03/2019   Procedure: TRANSESOPHAGEAL ECHOCARDIOGRAM (TEE)-with DCCV;  Surgeon: Lars Masson, MD;  Location: [facility] ENDOSCOPY;; EF 55 to 60%.  No LVH.  Normal RV.  Moderate LA dilation.  No thrombus.  Mild RA dilation.  Mild to moderate TR.  Mild aortic valve sclerosis, no stenosis.  Moderately elevated PA pressures (47 mmHg)   TONSILLECTOMY AND ADENOIDECTOMY     TRANSTHORACIC ECHOCARDIOGRAM  2009   Normal LV size and function. EF 60-65%. No regional wall motion and amount is. Mild aortic sclerosis, no stenosis   TUBAL LIGATION  1979   Past Medical History:  Diagnosis Date   B12 deficiency    Complication of anesthesia    slow to wake up    Depression    Diabetes mellitus type 2, diet-controlled (HCC) 07/24/2018   Fibromyalgia    Generalized hyperhidrosis 04/17/2014   GERD (gastroesophageal  reflux disease)    History of rheumatic fever as a child    Hyperlipemia    Hypertension    denies htn on 05/09/16   HYPOTHYROIDISM 05/29/2007   Irritable bowel syndrome    Migraines    hx of    OSA (obstructive sleep apnea) 06/26/2019   Senile calcific aortic valve sclerosis    Aortic Sclerosis-no stenosis   There were no vitals taken for this visit.  Opioid Risk Score:   Fall Risk Score:  `1  Depression screen [facility] 2/9     02/23/2023   10:38 AM 01/19/2023    1:43 PM 10/25/2022    1:08 PM 09/28/2022    2:07 PM 08/29/2022    1:54 PM 08/04/2022   12:54 PM 07/06/2022    1:19 PM  Depression screen PHQ 2/9  Decreased Interest 0 0 0 0 0 0 0  Down, Depressed, Hopeless 0 0 0 0 0 0 0  PHQ - 2 Score 0 0 0 0 0 0 0    Review of Systems  Musculoskeletal:  Positive for back pain and neck pain.       Left knee pain      Objective:   Physical Exam Vitals and nursing note reviewed.  Constitutional:      Appearance: Normal appearance.  Cardiovascular:     Rate and Rhythm: Normal rate and regular rhythm.     Pulses: Normal pulses.     Heart sounds: Normal heart sounds.  Pulmonary:     Effort: Pulmonary effort is normal.     Breath sounds: Normal breath sounds.  Musculoskeletal:     Cervical back: Normal range of motion and neck supple.     Comments: Normal Muscle Bulk and Muscle Testing Reveals:  Upper Extremities:Full  ROM and Muscle Strength 5/5 Thoracic Paraspinal Tenderness: T-7-T-9  Lower Extremities: Full ROM and Muscle Strength 5/5 Arises from chair with ease Narrow Based  Gait     Skin:    General: Skin is warm and dry.  Neurological:     Mental Status: She is alert and oriented to person, place, and time.  Psychiatric:        Mood and Affect: Mood normal.        Behavior: Behavior normal.  Assessment & Plan:  1. Cervical postlaminectomy syndrome:/ Cervicalgia/ Cervical Radiculitis/ with neck and shoulder pain/ Chronic Midline Thoracic Pain.  Continue  exercise routine and using heat therapy. 03/30/2023 Continue : current medication regimen. Refilled: MS Contin 15 mg one tablet three times a day #90.   We will continue the opioid monitoring program, this consists of regular clinic visits, examinations, urine drug screen, pill counts as well as use of [place] Controlled Substance Reporting system. A 12 month History has been reviewed on the [place] Controlled Substance Reporting System on 03/30/2023.   2. Fibromyalgia/ Neuropathy: Continue current medication regimen with Gabapentin, activity and exercise regime. 03/30/2023. 3. Left Knee Pain/ Mild Degenerative Changes S/P : on 12/06/2022 with Dr Wynn Banker.   LEFT Genicular nerve block x 3, Upper medial, Upper lateral , and Lower Medial under fluoroscopic guidance    On 02/09/2023: S/P LEFT KNEE Genicular nerve Radiofrequency neurotomy x 3 nerves, Upper medial, Upper lateral , and Lower Medial under fluoroscopic guidance . Continue current medication regimen with Voltaren Gel. 03/30/2023. 4. Bilateral  Ankle Pain:  No complaints today. Continue HEP as tolerated. Continue to monitor.03/30/2023. 5. Muscle Spasm: Continue current medication regimen with  Robaxin. Continue to Monitor.03/30/2023.. 6. Opioid Induced Constipation: No complaints Continue to Monitor. 03/30/2023. 7. Midline Low Back Pain: Continue current medication regime, continue HEP as tolerated. 03/30/2023. 8. Right Gluteal Medius Weakness and Atrophy: No complaints Today:Continue with Hip Abduction Exercises as tolerated: Continue to Monitor. 03/30/2023. 9. Right Hip Pain/ Right Greater Trochanteric Tenderness: No complaints today. Continue to Alternate Ice and Heat Therapy. Continue to Monitor. 03/30/2023  10. Sacroiliac Joint Pain:  S/P Sacroiliac Injection with good relief noted on 11/10/2022. Continue to monitor.  11. Lumbar Radiculitis: No complaints today.Continue Gabapentin. Continue HEP as tolerated. Continue  current medication regimen. Continue to monitor.  03/30/2023.     F/U in 1 month

## 2023-03-30 ENCOUNTER — Encounter: Payer: Self-pay | Admitting: Registered Nurse

## 2023-03-30 ENCOUNTER — Encounter: Payer: Medicare Other | Attending: Physical Medicine & Rehabilitation | Admitting: Registered Nurse

## 2023-03-30 VITALS — BP 116/69 | HR 63 | Ht 64.5 in | Wt 188.0 lb

## 2023-03-30 DIAGNOSIS — G894 Chronic pain syndrome: Secondary | ICD-10-CM | POA: Diagnosis present

## 2023-03-30 DIAGNOSIS — M1712 Unilateral primary osteoarthritis, left knee: Secondary | ICD-10-CM | POA: Diagnosis present

## 2023-03-30 DIAGNOSIS — M546 Pain in thoracic spine: Secondary | ICD-10-CM | POA: Insufficient documentation

## 2023-03-30 DIAGNOSIS — Z79891 Long term (current) use of opiate analgesic: Secondary | ICD-10-CM

## 2023-03-30 DIAGNOSIS — Z5181 Encounter for therapeutic drug level monitoring: Secondary | ICD-10-CM

## 2023-03-30 DIAGNOSIS — G8929 Other chronic pain: Secondary | ICD-10-CM

## 2023-03-30 MED ORDER — MORPHINE SULFATE ER 15 MG PO TBCR: 15.0000 mg | EXTENDED_RELEASE_TABLET | Freq: Three times a day (TID) | ORAL | 0 refills | Status: DC

## 2023-04-12 ENCOUNTER — Ambulatory Visit: Payer: Medicare Other | Attending: Cardiology | Admitting: Cardiology

## 2023-04-12 ENCOUNTER — Encounter: Payer: Self-pay | Admitting: Cardiology

## 2023-04-12 VITALS — BP 134/52 | HR 76 | Ht 64.5 in | Wt 189.0 lb

## 2023-04-12 DIAGNOSIS — I483 Typical atrial flutter: Secondary | ICD-10-CM

## 2023-04-12 DIAGNOSIS — I10 Essential (primary) hypertension: Secondary | ICD-10-CM

## 2023-04-12 DIAGNOSIS — D6869 Other thrombophilia: Secondary | ICD-10-CM | POA: Diagnosis not present

## 2023-04-12 DIAGNOSIS — I4819 Other persistent atrial fibrillation: Secondary | ICD-10-CM | POA: Diagnosis not present

## 2023-04-12 NOTE — Patient Instructions (Signed)
Medication Instructions:  The current medical regimen is effective;  continue present plan and medications.  *If you need a refill on your cardiac medications before your next appointment, please call your pharmacy*   Follow-Up: At [facility], you and your health needs are our priority.  As part of our continuing mission to provide you with exceptional heart care, we have created designated Provider Care Teams.  These Care Teams include your primary Cardiologist (physician) and Advanced Practice Providers (APPs -  Physician Assistants and Nurse Practitioners) who all work together to provide you with the care you need, when you need it.  We recommend signing up for the patient portal called "MyChart".  Sign up information is provided on this After Visit Summary.  MyChart is used to connect with patients for Virtual Visits (Telemedicine).  Patients are able to view lab/test results, encounter notes, upcoming appointments, etc.  Non-urgent messages can be sent to your provider as well.   To learn more about what you can do with MyChart, go to [email or web address].    Your next appointment:   6 month(s)  Provider:   Sherie Don, NP

## 2023-04-12 NOTE — Progress Notes (Signed)
Cardiology Office Note Date:  04/12/2023  Patient ID:  Dannilynn, Pruden 1951-04-16, MRN [id number] PCP:  Hannah Beat, MD  Cardiologist:  Bryan Lemma, MD Electrophysiologist: Lanier Prude, MD    Chief Complaint: afib follow-up  History of Present Illness: YARENY WIGGERS is a 72 y.o. female with PMH notable for persis Afib, HTN, hypothyroid, fibromyalgia, T2DM; seen today for Lanier Prude, MD for follow-up visit.   She is s/p afib ablation 11/2021 with PVI and posterior wall. She last saw Dr. Lalla Brothers 08/2022 for routine follow-up. Feeling well without recurrence of afib, dilt and eliquis continued.  I saw her about a month ago after a particularly symptomatic afib episode where she took leftover flecainide and PRN dilt. Labs normal and 2 wk monitor updated without AFib noted. She also c/o chest discomfort and recommended to f/up with Dr. Herbie Baltimore.   On follow-up today, she continues to have palpitation episodes and heart racing most mornings right when she wakes up. The episodes resolve quickly, but are worrisome to her. She takes her dilt every morning. She continues to have central chest discomfort, it is sometimes relieved with belching. She does have GERD and has been well-managed on rabeprazole. No SOB, dizziness, lightheadedness, presyncope with chest discomfort.  She continues to have LLE edema, slightly improved. Wears compression socks daily.   Diligently takes eliquis BID, no bleeding concerns.   AAD History: Flecainide - stopped d/t no afib after ablation  Past Medical History:  Diagnosis Date   B12 deficiency    Complication of anesthesia    slow to wake up    Depression    Diabetes mellitus type 2, diet-controlled (HCC) 07/24/2018   Fibromyalgia    Generalized hyperhidrosis 04/17/2014   GERD (gastroesophageal reflux disease)    History of rheumatic fever as a child    Hyperlipemia    Hypertension    denies htn on 05/09/16   HYPOTHYROIDISM 05/29/2007    Irritable bowel syndrome    Migraines    hx of    OSA (obstructive sleep apnea) 06/26/2019   Senile calcific aortic valve sclerosis    Aortic Sclerosis-no stenosis    Past Surgical History:  Procedure Laterality Date   17 HOUR PH STUDY N/A 01/04/2016   Procedure: 24 HOUR PH STUDY;  Surgeon: Ruffin Frederick, MD;  Location: [facility];  Service: Gastroenterology;  Laterality: N/A;   ABDOMINAL HYSTERECTOMY  1988   ADENOIDECTOMY     ANTERIOR CERVICAL DECOMP/DISCECTOMY FUSION  2009, 1993   first procedure in ~ 1993/Dr Botero   APPENDECTOMY  1988   ATRIAL FIBRILLATION ABLATION N/A 12/16/2021   Procedure: ATRIAL FIBRILLATION ABLATION;  Surgeon: Lanier Prude, MD;  Location: [facility];  Service: Cardiovascular;  Laterality: N/A;   BACK SURGERY     CARDIOVERSION N/A 04/03/2019   Procedure: CARDIOVERSION;  Surgeon: Lars Masson, MD;  Location: [facility] ENDOSCOPY;  Service: Cardiovascular;  Laterality: N/A;   CHOLECYSTECTOMY  2005   ESOPHAGEAL MANOMETRY N/A 01/04/2016   Procedure: ESOPHAGEAL MANOMETRY (EM) 24 HR PH;  Surgeon: Ruffin Frederick, MD;  Location: [facility];  Service: Gastroenterology;  Laterality: N/A;   ESOPHAGOGASTRODUODENOSCOPY (EGD) WITH PROPOFOL N/A 05/17/2016   Procedure: ESOPHAGOGASTRODUODENOSCOPY (EGD) WITH PROPOFOL;  Surgeon: Ruffin Frederick, MD;  Location: [facility];  Service: Gastroenterology;  Laterality: N/A;   FOOT SURGERY Left 08/2022   NASAL SINUS SURGERY  2006   SAVORY DILATION N/A 05/17/2016   Procedure: SAVORY DILATION;  Surgeon: Reeves Forth  Adela Lank, MD;  Location: [facility];  Service: Gastroenterology;  Laterality: N/A;   TEE WITHOUT CARDIOVERSION N/A 04/03/2019   Procedure: TRANSESOPHAGEAL ECHOCARDIOGRAM (TEE)-with DCCV;  Surgeon: Lars Masson, MD;  Location: [facility] ENDOSCOPY;; EF 55 to 60%.  No LVH.  Normal RV.  Moderate LA dilation.  No thrombus.  Mild RA dilation.  Mild to moderate TR.  Mild aortic valve  sclerosis, no stenosis.  Moderately elevated PA pressures (47 mmHg)   TONSILLECTOMY AND ADENOIDECTOMY     TRANSTHORACIC ECHOCARDIOGRAM  2009   Normal LV size and function. EF 60-65%. No regional wall motion and amount is. Mild aortic sclerosis, no stenosis   TUBAL LIGATION  1979    Current Outpatient Medications  Medication Instructions   acetaminophen (TYLENOL) 650 mg, Oral, 2 times daily   ascorbic acid (CVS VITAMIN C) 500 MG tablet    B Complex Vitamins (VITAMIN B COMPLEX 100 IJ)    cetirizine (ZYRTEC) 10 mg, Oral, Daily at bedtime   clindamycin (CLEOCIN T) 1 % lotion 1 application , Topical, Daily PRN   Coenzyme Q-10 100 mg, Oral, Every morning   Cyanocobalamin (B-12) 1000 MCG TABS    diclofenac sodium (VOLTAREN) 4 g, Topical, 4 times daily PRN   diltiazem (CARDIZEM CD) 240 MG 24 hr capsule TAKE 1 CAPSULE BY MOUTH EVERY DAY   diltiazem (CARDIZEM) 30 MG tablet Take one tablet by mouth every 4 hours as needed for heart rate greater than 100, top number blood pressure needs to be above 100   docusate sodium (COLACE) 250 mg, Oral, Daily at bedtime   Eliquis 5 mg, Oral, 2 times daily   flecainide (TAMBOCOR) 50 MG tablet    fluocinonide (LIDEX) 0.05 % external solution 1 application , Topical, Daily PRN   gabapentin (NEURONTIN) 300 MG capsule TAKE 1 CAPSULE BY MOUTH 3 TIMES  DAILY   hydroxypropyl methylcellulose / hypromellose (ISOPTO TEARS / GONIOVISC) 2.5 % ophthalmic solution 1-2 drops, Both Eyes, 3 times daily PRN   levothyroxine (SYNTHROID) 50 MCG tablet TAKE 1 TABLET BY MOUTH DAILY   loratadine (CLARITIN) 10 mg, Oral, Every morning   Magnesium Bisglycinate (MAG GLYCINATE PO) 400 mg, Oral, Daily at bedtime   methocarbamol (ROBAXIN) 500 mg, Oral, Every 8 hours PRN   morphine (MS CONTIN) 15 mg, Oral, 3 times daily   oxybutynin (DITROPAN-XL) 10 MG 24 hr tablet TAKE 1 TABLET BY MOUTH AT  BEDTIME   oxymetazoline (AFRIN) 0.05 % nasal spray 1 spray, Each Nare, 2 times daily PRN    RABEprazole (ACIPHEX) 20 MG tablet Every evening   Resveratrol 100 mg, Oral, 2 times daily   tretinoin (RETIN-A) 0.05 % cream 1 application , Topical, Daily PRN   TYRVAYA 0.03 MG/ACT SOLN 1 spray, Each Nare, Daily at bedtime   Vitamin D 5,000 Units, Oral, Every morning   Wheat Dextrin (BENEFIBER) CHEW 2 tablets, Oral, 2 times daily PRN    Social History:  The patient  reports that she has never smoked. She has never used smokeless tobacco. She reports current alcohol use of about 1.0 standard drink of alcohol per week. She reports that she does not use drugs.   Family History:  The patient's family history includes Allergies in her father and mother; Breast cancer in her mother and paternal aunt; Diabetes in her father and mother; Emphysema in her father; Heart disease in her father and mother.  ROS:  Please see the history of present illness. All other systems are reviewed and otherwise negative.  PHYSICAL EXAM:  VS:  BP (!) 134/52 (BP Location: Left Arm, Patient Position: Sitting, Cuff Size: Normal)   Pulse 76   Ht 5' 4.5" (1.638 m)   Wt 189 lb (85.7 kg)   SpO2 99%   BMI 31.94 kg/m  BMI: Body mass index is 31.94 kg/m.  GEN- The patient is well appearing, alert and oriented x 3 today.   Lungs- Clear to ausculation bilaterally, normal work of breathing.  Heart- Regular rate and rhythm, no murmurs, rubs or gallops Extremities- 1+ L>R peripheral edema, warm, dry   EKG is not ordered. Personal review of EKG from today shows:   NSR, rate 75bpm      Recent Labs: 10/03/2022: ALT 18 03/15/2023: BUN 12; Creatinine, Ser 0.72; Hemoglobin 12.1; Magnesium 1.8; Platelets 236; Potassium 4.1; Sodium 143; TSH 2.260  10/03/2022: Cholesterol 161; HDL 59.20; LDL Cholesterol 82; Total CHOL/HDL Ratio 3; Triglycerides 101.0; VLDL 20.2   CrCl cannot be calculated (Patient's most recent lab result is older than the maximum 21 days allowed.).   Wt Readings from Last 3 Encounters:  03/30/23 188 lb (85.3  kg)  03/15/23 186 lb 6.4 oz (84.6 kg)  02/23/23 185 lb (83.9 kg)     Additional studies reviewed include: Previous EP, cardiology notes.   Long term monitoring, 04/10/2023 HR 52 - 203, average 75 bpm. 1 NSVT lasting 4 beats 44 nonsustained SVT, longest 14.4 seconds with an average rate of 112 bpm. Rhythm strip suggests sinus/atrial tachycardia. Rare supraventricular and ventricular ectopy. No atrial fibrillation. No sustained arrhythmias.  ETT, 09/25/2019 Exercise time: 3 min 37 seconds Workload: 5.3 METS Test stopped due to: SOB BP response: Elevated without hypertensive response HR response: 88% maximal heart rate Rhythm: sinus rhythm, one PAC in recovery   Patient on flecanide. No change in QRS duration with exercise.  No ischemia on stress ECG at low workload.  TEE, 04/03/2019  1. Left ventricular ejection fraction, by visual estimation, is 55 to 60%. The left ventricle has normal function. Normal left ventricular size. There is no left ventricular hypertrophy.   2. Global right ventricle has normal systolic function.The right ventricular size is normal. No increase in right ventricular wall thickness.   3. Left atrial size was moderately dilated.   4. No thrombus is seen in the left atrium or left atrial appendage.   5. Right atrial size was mildly dilated.   6. The mitral valve is normal in structure. Mild mitral valve regurgitation. No evidence of mitral stenosis.   7. The tricuspid valve is normal in structure. Tricuspid valve regurgitation mild-moderate.   8. The aortic valve is normal in structure. Aortic valve regurgitation is mild by color flow Doppler. Mild aortic valve sclerosis without stenosis.   9. The pulmonic valve was normal in structure. Pulmonic valve regurgitation is not visualized by color flow Doppler.  10. Moderately elevated pulmonary artery systolic pressure.  11. The tricuspid regurgitant velocity is 3.12 m/s, and with an assumed right atrial pressure  of 8 mmHg, the estimated right ventricular systolic pressure is moderately elevated at 46.9 mmHg.  12. The inferior vena cava is normal in size with greater than 50% respiratory variability, suggesting right atrial pressure of 3 mmHg.  13. No thrombus is seen in the left atrium or left atrial appendage.  14. This TEE was followed by a successful cardioversion.   CT coronary, 07/11/2018 1. Coronary artery calcium score 63 Agatston, placing the patient in the 74th percentile for age and gender. This suggests intermediate  risk for future cardiac events. 2.  Nonobstructive coronary disease.  ASSESSMENT AND PLAN:  #) persis Afib #) typical aflutter #) palpitations Recent ambulatory monitor without AFib, brief SVT episodes. SVT episodes did not correlate with symptom activation, but do correlate with the morning episodes she now describes.  Continue 240mg  dilt daily + 30mg  dilt PRN for afib episodes Recommended she switch to taking daily diltiazem nightly to see if morning SVT episodes are better managed  #) Hypercoag d/t persis afib CHA2DS2-VASc Score = 4 [CHF History: 0, HTN History: 1, Diabetes History: 1, Stroke History: 0, Vascular Disease History: 0, Age Score: 1, Gender Score: 1].  Therefore, the patient's annual risk of stroke is 4.8 %. Stroke ppx - 5mg  eliquis BID, appropriately dosed No bleeding concerns  #) Chest pain Continues to be symptomatic, has not worsened in intensity or associated symptoms Has close follow-up with Dr. Herbie Baltimore next week.  Most recent lipid panel 09/2022 nl 2020 coronary CTA with non-obstructive CAD  #) HTN Well controlled in office Continue 240mg  dilt daily  #) OSA Previously unable to tolerate CPAP machine though has had significant weight loss since that time Pulm eval pending      Current medicines are reviewed at length with the patient today.   The patient does not have concerns regarding her medicines.  The following changes were made today:   none  Labs/ tests ordered today include:  No orders of the defined types were placed in this encounter.    Disposition: Follow up with Dr. Lalla Brothers or EP APP in 6 months, sooner if symptoms persist or worsen   Signed, Sherie Don, NP  04/12/23  9:00 AM  Electrophysiology [facility]

## 2023-04-17 ENCOUNTER — Ambulatory Visit: Payer: Medicare Other | Attending: Cardiology | Admitting: Cardiology

## 2023-04-17 ENCOUNTER — Encounter: Payer: Self-pay | Admitting: Cardiology

## 2023-04-17 VITALS — BP 102/62 | HR 71 | Ht 64.6 in | Wt 190.0 lb

## 2023-04-17 DIAGNOSIS — I4819 Other persistent atrial fibrillation: Secondary | ICD-10-CM | POA: Diagnosis not present

## 2023-04-17 DIAGNOSIS — R011 Cardiac murmur, unspecified: Secondary | ICD-10-CM | POA: Diagnosis not present

## 2023-04-17 DIAGNOSIS — E785 Hyperlipidemia, unspecified: Secondary | ICD-10-CM

## 2023-04-17 DIAGNOSIS — I4811 Longstanding persistent atrial fibrillation: Secondary | ICD-10-CM

## 2023-04-17 DIAGNOSIS — I10 Essential (primary) hypertension: Secondary | ICD-10-CM

## 2023-04-17 DIAGNOSIS — D6869 Other thrombophilia: Secondary | ICD-10-CM

## 2023-04-17 DIAGNOSIS — R079 Chest pain, unspecified: Secondary | ICD-10-CM

## 2023-04-17 NOTE — Assessment & Plan Note (Addendum)
Newly diagnosed murmur on exam, this may have been before, but was not commented on. Documented sclerosis on aortic valve in 2020 by TEE Would like to exclude progression to aortic stenosis.  Plan: Check 2D echo

## 2023-04-17 NOTE — Progress Notes (Signed)
Cardiology Office Note:  .   Date:  04/17/2023  ID:  [name], DOB 1951/02/04, MRN [id number] PCP: Hannah Beat, MD  [facility] Providers Cardiologist:  Bryan Lemma, MD Electrophysiologist:  Lanier Prude, MD     Chief Complaint  Patient presents with   Follow-up    Delayed follow-up with primary cardiologist-evaluation of chest pain   Atrial Fibrillation    Status post ablation, but questionable breakthrough episode-seen by A-fib clinic.    Patient Profile: .     [name] is a  72 y.o. female  with a PMH notable for Persistent Atrial Fibrillation (s/p ablation in June 2023), HTN, DM-2, hypothyroidism who presents here for delayed primary cardiology follow-up at the request of Copland, Karleen Hampshire, MD. My last clinic appointment with her was in January 2023.    [name] was last seen on 04/12/2023 by Patrick North, NP.  This was a 1 month follow-up after being seen in September for prolonged episode of A-fib.  She took leftover flecainide and PRN diltiazem.  Follow-up monitor did not show any evidence of A-fib.  She also noticed some chest discomfort.  Still has some palpitation episodes with heart racing in the mornings when she wakes up.  Taking her diltiazem daily.  Still having chest discomfort.  No dyspnea.   Subjective  Discussed the use of AI scribe software for clinical note transcription with the patient, who gave verbal consent to proceed.  History of Present Illness   The patient, with a history of atrial fibrillation and recent ablation, reports occasional episodes of elevated heart rate. She describes these episodes as brief, lasting only a few seconds, and not associated with any other symptoms. She has been managing these episodes with Cardizem, which she recently switched to taking at bedtime due to morning episodes of increased heart rate and sweating. Since the switch, the patient reports improvement.  In addition to the heart rate  concerns, the patient has been experiencing occasional, non-specific chest discomfort. The discomfort is not associated with exertion or specific activities and does not worsen with walking. It typically lasts no more than a minute and is not associated with shortness of breath.  The patient also reports a peculiar symptom involving her left hand. Over the past four months, she has experienced three episodes where her hand becomes numb, her middle finger turns purple, and the hand becomes ice cold. These episodes last for an hour or two.  Lastly, the patient mentions a persistent swelling in her left leg following foot surgery in March. She also points out a red spot on her leg, which she believes is inflammation from the surgery. The patient has been managing the swelling with foot elevation and exercises.      Cardiovascular ROS: positive for - chest pain, edema, palpitations, rapid heart rate, and shortness of breath negative for - dyspnea on exertion, orthopnea, paroxysmal nocturnal dyspnea, or syncope or near syncope, TIA or emesis fugax, claudication  ROS:  Review of Systems - Negative except symptoms noted above    Objective  Studies Reviewed: Marland Kitchen   EKG Interpretation Date/Time:  Monday April 17 2023 09:28:46 EDT Ventricular Rate:  71 PR Interval:  142 QRS Duration:  80 QT Interval:  378 QTC Calculation: 410 R Axis:   45  Text Interpretation: Normal sinus rhythm Normal ECG When compared with ECG of 15-Mar-2023 14:44, No significant change was found Confirmed by Bryan Lemma ([id number]) on 04/17/2023 9:35:47 AM   14-day  Zio patch monitor September 2024:  HR 52 - 203, average 75 bpm. 1 NSVT lasting 4 beats 44 nonsustained SVT, longest 14.4 seconds with an average rate of 112 bpm. Rhythm strip suggests sinus/atrial tachycardia. Rare supraventricular and ventricular ectopy. No atrial fibrillation. No sustained arrhythmias. Coronary CTA 07/11/2018: Nonobstructive CAD with a CAC of  63.  Risk Assessment/Calculations:    CHA2DS2-VASc Score = 4   This indicates a 4.8% annual risk of stroke. The patient's score is based upon: CHF History: 0 HTN History: 1 Diabetes History: 1 Stroke History: 0 Vascular Disease History: 0 Age Score: 1 Gender Score: 1          Physical Exam:   VS:  BP 102/62   Pulse 71   Ht 5' 4.6" (1.641 m)   Wt 190 lb (86.2 kg)   SpO2 97%   BMI 32.01 kg/m    Wt Readings from Last 3 Encounters:  04/17/23 190 lb (86.2 kg)  04/12/23 189 lb (85.7 kg)  03/30/23 188 lb (85.3 kg)    GEN: Well nourished, well developed in no acute distress; healthy-appearing NECK: No JVD; No carotid bruits CARDIAC: Normal S1, S2; RRR, 1-2/6 SEM at RUSB-neck.  Otherwise no notable murmurs, rubs, gallops RESPIRATORY:  Clear to auscultation without rales, wheezing or rhonchi ; nonlabored, good air movement. ABDOMEN: Soft, non-tender, non-distended EXTREMITIES:  No edema; No deformity     ASSESSMENT AND PLAN: .    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  Now on Eliquis.  No bleeding issues.  Okay to hold Eliquis 2 to 3 days preop for surgeries or procedures.  2 days for most procedures in 3 days for high risk.  No need for bridge      Hyperlipidemia with target LDL less than 100 (Chronic)   Longstanding persistent atrial fibrillation (HCC); CHA2D2DSVasc =4; Eliquis - Primary (Chronic)    Post-ablation with one suspected episode of Afib, but not confirmed on heart monitor. Frequent episodes of elevated heart rate. -Continue Cardizem, with adjustment to bedtime dosing to manage morning heart rate surges. -Ensure adequate hydration -Continue Eliquis      Relevant Orders   EKG 12-Lead (Completed)   ECHOCARDIOGRAM COMPLETE     Other   Chest pain with moderate risk for cardiac etiology     Very unusual sounding symptoms. Intermittent, both exertional and non-exertional, and brief episodes of chest discomfort. Not associated with shortness of breath. -Order Myoview stress test for further evaluation.      Relevant Orders   MYOCARDIAL PERFUSION IMAGING   Systolic ejection murmur (Chronic)    Newly diagnosed murmur on exam, this may have been before, but was not commented on. Documented sclerosis on aortic valve in 2020 by TEE Would like to exclude progression to aortic stenosis.  Plan: Check 2D echo      Relevant Orders   ECHOCARDIOGRAM COMPLETE    Left Hand Numbness Intermittent episodes of left hand numbness and coldness, particularly in the fingers. -Advise to keep hand warm and try short-acting diltiazem during episodes. -Consider ultrasound to assess blood flow from chest to arm.      Informed Consent   Shared Decision Making/Informed Consent The risks [chest pain, shortness of breath, cardiac arrhythmias, dizziness, blood pressure fluctuations, myocardial infarction, stroke/transient ischemic attack, nausea, vomiting, allergic reaction, radiation exposure, metallic taste sensation  and life-threatening complications (estimated to be 1 in 10,000)], benefits (risk stratification, diagnosing coronary artery disease, treatment guidance) and alternatives of a nuclear stress test were discussed in detail with Ms. Vandoorn and she agrees to proceed.      Follow-up in January 2025 to review results of tests. Return in about 3 months (around 07/18/2023) for 3-4 month follow-up.  Total time spent: 25 min spent with patient + 12 min spent charting = 37 min      Signed, Marykay Lex, MD, MS Bryan Lemma, M.D., M.S. Interventional Cardiologist  [facility]  Pager # [phone] Phone # [phone] [address] [place], [place] [address]

## 2023-04-17 NOTE — Assessment & Plan Note (Signed)
Very unusual sounding symptoms. Intermittent, both exertional and non-exertional, and brief episodes of chest discomfort. Not associated with shortness of breath. -Order Myoview stress test for further evaluation.

## 2023-04-17 NOTE — Patient Instructions (Addendum)
Medication Instructions:   No changes *If you need a refill on your cardiac medications before your next appointment, please call your pharmacy*   Lab Work: Not needed If you have labs (blood work) drawn today and your tests are completely normal, you will receive your results only by: MyChart Message (if you have MyChart) OR A paper copy in the mail If you have any lab test that is abnormal or we need to change your treatment, we will call you to review the results.   Testing/Procedures: Both will be schedule at [facility] [address] Your physician has requested that you have an echocardiogram. Echocardiography is a painless test that uses sound waves to create images of your heart. It provides your doctor with information about the size and shape of your heart and how well your heart's chambers and valves are working. This procedure takes approximately one hour. There are no restrictions for this procedure. Please do NOT wear cologne, perfume, aftershave, or lotions (deodorant is allowed). Please arrive 15 minutes prior to your appointment time.   And  Your doctor has scheduled you for a Lexiscan  Myocardial Perfusion scan to obtain information about the blood flow to your heart. The test consists of taking pictures of your heart in two phases: while resting and after a stress test.  The stress test part you will be given a drug intended to have a similar effect on the heart to that of exercise.  The test will take approximately 3 to 4  hours to complete.    Do not eat or drink 2 hours prior to your test Do not consume products containing caffeine 12 hours prior to your test (examples: coffee (regular OR decaf), chocolate, sodas, tea) Your doctor may need you to hold certain medications prior to the test.  If so, these are listed below and should not be taken for 24 hours prior to the test.  If not listed below, you may take your medications as normal.  You may resume taking  held medications on your normal schedule once the test is complete.   Meds to hold: none Do bring a list of your current medications with you.  If you have held any meds in preparation for the test, please bring them, as you may be required to take them once the test is completed. Do wear comfortable clothes and walking shoes.  Do not wear dresses or overalls. Do NOT wear cologne, perfume, aftershave, or fragranced lotions the day of your test (deodorants okay). If these instructions are not followed your test will have to be rescheduled.   A nuclear cardiologist will review your test, prepare a report and send it to your physician.   If you have questions or concerns about your appointment, you can call the Nuclear Cardiology department at [phone]. If you cannot keep your appointment, please provide 48 hours notification to avoid a possible $50.00 charge to your account.   Please arrive 15 minutes prior to your appointment time for registration and insurance purposes     Follow-Up: At [facility], you and your health needs are our priority.  As part of our continuing mission to provide you with exceptional heart care, we have created designated Provider Care Teams.  These Care Teams include your primary Cardiologist (physician) and Advanced Practice Providers (APPs -  Physician Assistants and Nurse Practitioners) who all work together to provide you with the care you need, when you need it.  Your next appointment:   Keep appointment in Jan 2025  The format for your next appointment:   In Person  Provider:   Bryan Lemma, MD

## 2023-04-17 NOTE — Assessment & Plan Note (Signed)
Post-ablation with one suspected episode of Afib, but not confirmed on heart monitor. Frequent episodes of elevated heart rate. -Continue Cardizem, with adjustment to bedtime dosing to manage morning heart rate surges. -Ensure adequate hydration -Continue Eliquis

## 2023-04-17 NOTE — Assessment & Plan Note (Signed)
CHA2DS2-VASc score 4.  Now on Eliquis.  No bleeding issues.  Okay to hold Eliquis 2 to 3 days preop for surgeries or procedures.  2 days for most procedures in 3 days for high risk.  No need for bridge

## 2023-05-03 ENCOUNTER — Encounter: Payer: Medicare Other | Attending: Physical Medicine & Rehabilitation | Admitting: Registered Nurse

## 2023-05-03 ENCOUNTER — Encounter: Payer: Self-pay | Admitting: Registered Nurse

## 2023-05-03 VITALS — BP 127/72 | HR 65 | Ht 64.6 in | Wt 186.0 lb

## 2023-05-03 DIAGNOSIS — Z79891 Long term (current) use of opiate analgesic: Secondary | ICD-10-CM | POA: Diagnosis not present

## 2023-05-03 DIAGNOSIS — M546 Pain in thoracic spine: Secondary | ICD-10-CM | POA: Insufficient documentation

## 2023-05-03 DIAGNOSIS — M1712 Unilateral primary osteoarthritis, left knee: Secondary | ICD-10-CM | POA: Diagnosis present

## 2023-05-03 DIAGNOSIS — G8929 Other chronic pain: Secondary | ICD-10-CM | POA: Diagnosis present

## 2023-05-03 DIAGNOSIS — M542 Cervicalgia: Secondary | ICD-10-CM | POA: Diagnosis present

## 2023-05-03 DIAGNOSIS — G894 Chronic pain syndrome: Secondary | ICD-10-CM

## 2023-05-03 DIAGNOSIS — Z5181 Encounter for therapeutic drug level monitoring: Secondary | ICD-10-CM

## 2023-05-03 MED ORDER — MORPHINE SULFATE ER 15 MG PO TBCR: 15.0000 mg | EXTENDED_RELEASE_TABLET | Freq: Three times a day (TID) | ORAL | 0 refills | Status: DC

## 2023-05-03 NOTE — Progress Notes (Signed)
Subjective:    Patient ID: [name], female    DOB: 1951/02/13, 72 y.o.   MRN: [id number]  HPI: [name] is a 72 y.o. female who returns for follow up appointment for chronic pain and medication refill. She states her pain is located in her neck, mid- back and left knee pain. She rates her pain 4. Her current exercise regime is walking and performing stretching exercises.  [name] Morphine equivalent is 45.00 MME. Last UDS was Performed on 02/23/2023, it was consistent.       Pain Inventory Average Pain 4 Pain Right Now 4 My pain is constant, burning, dull, and aching  In the last 24 hours, has pain interfered with the following? General activity 4 Relation with others 4 Enjoyment of life 3 What TIME of day is your pain at its worst? evening and night Sleep (in general) Good  Pain is worse with: walking, bending, sitting, standing, and some activites Pain improves with: rest, heat/ice, medication, and injections Relief from Meds: 6  Family History  Problem Relation Age of Onset   Breast cancer Mother    Diabetes Mother    Heart disease Mother    Allergies Mother    Diabetes Father    Heart disease Father    Emphysema Father    Allergies Father    Breast cancer Paternal Aunt    Colon cancer Neg Hx    Esophageal cancer Neg Hx    Rectal cancer Neg Hx    Stomach cancer Neg Hx    Social History   Socioeconomic History   Marital status: Widowed    Spouse name: Not on file   Number of children: Not on file   Years of education: Not on file   Highest education level: Not on file  Occupational History   Occupation: Catering manager   Occupation: retired  Tobacco Use   Smoking status: Never   Smokeless tobacco: Never   Tobacco comments:    Never smoke 01/13/22  Vaping Use   Vaping status: Never Used  Substance and Sexual Activity   Alcohol use: Yes    Alcohol/week: 1.0 standard drink of alcohol    Types: 1 Glasses of wine per week    Comment: rare   Drug  use: Never   Sexual activity: Not Currently  Other Topics Concern   Not on file  Social History Narrative   Not on file   Social Determinants of Health   Financial Resource Strain: Low Risk  (01/19/2023)   Overall Financial Resource Strain (CARDIA)    Difficulty of Paying Living Expenses: Not hard at all  Food Insecurity: No Food Insecurity (01/19/2023)   Hunger Vital Sign    Worried About Running Out of Food in the Last Year: Never true    Ran Out of Food in the Last Year: Never true  Transportation Needs: No Transportation Needs (01/19/2023)   PRAPARE - Administrator, Civil Service (Medical): No    Lack of Transportation (Non-Medical): No  Physical Activity: Insufficiently Active (01/19/2023)   Exercise Vital Sign    Days of Exercise per Week: 7 days    Minutes of Exercise per Session: 20 min  Stress: No Stress Concern Present (01/19/2023)   [employer] of Occupational Health - Occupational Stress Questionnaire    Feeling of Stress : Not at all  Social Connections: Moderately Integrated (01/19/2023)   Social Connection and Isolation Panel [NHANES]    Frequency of Communication with Friends  and Family: More than three times a week    Frequency of Social Gatherings with Friends and Family: More than three times a week    Attends Religious Services: More than 4 times per year    Active Member of Clubs or Organizations: Yes    Attends Banker Meetings: More than 4 times per year    Marital Status: Widowed   Past Surgical History:  Procedure Laterality Date   9 HOUR PH STUDY N/A 01/04/2016   Procedure: 24 HOUR PH STUDY;  Surgeon: Ruffin Frederick, MD;  Location: [facility];  Service: Gastroenterology;  Laterality: N/A;   ABDOMINAL HYSTERECTOMY  1988   ADENOIDECTOMY     ANTERIOR CERVICAL DECOMP/DISCECTOMY FUSION  2009, 1993   first procedure in ~ 1993/Dr Botero   APPENDECTOMY  1988   ATRIAL FIBRILLATION ABLATION N/A 12/16/2021   Procedure:  ATRIAL FIBRILLATION ABLATION;  Surgeon: Lanier Prude, MD;  Location: [facility];  Service: Cardiovascular;  Laterality: N/A;   BACK SURGERY     CARDIOVERSION N/A 04/03/2019   Procedure: CARDIOVERSION;  Surgeon: Lars Masson, MD;  Location: [facility] ENDOSCOPY;  Service: Cardiovascular;  Laterality: N/A;   CHOLECYSTECTOMY  2005   ESOPHAGEAL MANOMETRY N/A 01/04/2016   Procedure: ESOPHAGEAL MANOMETRY (EM) 24 HR PH;  Surgeon: Ruffin Frederick, MD;  Location: [facility];  Service: Gastroenterology;  Laterality: N/A;   ESOPHAGOGASTRODUODENOSCOPY (EGD) WITH PROPOFOL N/A 05/17/2016   Procedure: ESOPHAGOGASTRODUODENOSCOPY (EGD) WITH PROPOFOL;  Surgeon: Ruffin Frederick, MD;  Location: [facility];  Service: Gastroenterology;  Laterality: N/A;   FOOT SURGERY Left 08/2022   NASAL SINUS SURGERY  2006   SAVORY DILATION N/A 05/17/2016   Procedure: SAVORY DILATION;  Surgeon: Ruffin Frederick, MD;  Location: [facility];  Service: Gastroenterology;  Laterality: N/A;   TEE WITHOUT CARDIOVERSION N/A 04/03/2019   Procedure: TRANSESOPHAGEAL ECHOCARDIOGRAM (TEE)-with DCCV;  Surgeon: Lars Masson, MD;  Location: [facility] ENDOSCOPY;; EF 55 to 60%.  No LVH.  Normal RV.  Moderate LA dilation.  No thrombus.  Mild RA dilation.  Mild to moderate TR.  Mild aortic valve sclerosis, no stenosis.  Moderately elevated PA pressures (47 mmHg)   TONSILLECTOMY AND ADENOIDECTOMY     TRANSTHORACIC ECHOCARDIOGRAM  2009   Normal LV size and function. EF 60-65%. No regional wall motion and amount is. Mild aortic sclerosis, no stenosis   TUBAL LIGATION  1979   Past Surgical History:  Procedure Laterality Date   59 HOUR PH STUDY N/A 01/04/2016   Procedure: 24 HOUR PH STUDY;  Surgeon: Ruffin Frederick, MD;  Location: [facility];  Service: Gastroenterology;  Laterality: N/A;   ABDOMINAL HYSTERECTOMY  1988   ADENOIDECTOMY     ANTERIOR CERVICAL DECOMP/DISCECTOMY FUSION  2009, 1993   first  procedure in ~ 1993/Dr Botero   APPENDECTOMY  1988   ATRIAL FIBRILLATION ABLATION N/A 12/16/2021   Procedure: ATRIAL FIBRILLATION ABLATION;  Surgeon: Lanier Prude, MD;  Location: [facility];  Service: Cardiovascular;  Laterality: N/A;   BACK SURGERY     CARDIOVERSION N/A 04/03/2019   Procedure: CARDIOVERSION;  Surgeon: Lars Masson, MD;  Location: [facility] ENDOSCOPY;  Service: Cardiovascular;  Laterality: N/A;   CHOLECYSTECTOMY  2005   ESOPHAGEAL MANOMETRY N/A 01/04/2016   Procedure: ESOPHAGEAL MANOMETRY (EM) 24 HR PH;  Surgeon: Ruffin Frederick, MD;  Location: [facility];  Service: Gastroenterology;  Laterality: N/A;   ESOPHAGOGASTRODUODENOSCOPY (EGD) WITH PROPOFOL N/A 05/17/2016   Procedure: ESOPHAGOGASTRODUODENOSCOPY (EGD) WITH  PROPOFOL;  Surgeon: Ruffin Frederick, MD;  Location: [facility] ENDOSCOPY;  Service: Gastroenterology;  Laterality: N/A;   FOOT SURGERY Left 08/2022   NASAL SINUS SURGERY  2006   SAVORY DILATION N/A 05/17/2016   Procedure: SAVORY DILATION;  Surgeon: Ruffin Frederick, MD;  Location: [facility] ENDOS[facility];  Service: Gastroenterology;  Laterality: N/A;   TEE WITHOUT CARDIOVERSION N/A 04/03/2019   Procedure: TRANSESOPHAGEAL ECHOCARDIOGRAM (TEE)-with DCCV;  Surgeon: Lars Masson, MD;  Location: [facility] ENDOSCOPY;; EF 55 to 60%.  No LVH.  Normal RV.  Moderate LA dilation.  No thrombus.  Mild RA dilation.  Mild to moderate TR.  Mild aortic valve sclerosis, no stenosis.  Moderately elevated PA pressures (47 mmHg)   TONSILLECTOMY AND ADENOIDECTOMY     TRANSTHORACIC ECHOCARDIOGRAM  2009   Normal LV size and function. EF 60-65%. No regional wall motion and amount is. Mild aortic sclerosis, no stenosis   TUBAL LIGATION  1979   Past Medical History:  Diagnosis Date   B12 deficiency    Complication of anesthesia    slow to wake up    Depression    Diabetes mellitus type 2, diet-controlled (HCC) 07/24/2018   Fibromyalgia    Generalized hyperhidrosis  04/17/2014   GERD (gastroesophageal reflux disease)    History of rheumatic fever as a child    Hyperlipemia    Hypertension    denies htn on 05/09/16   HYPOTHYROIDISM 05/29/2007   Irritable bowel syndrome    Migraines    hx of    OSA (obstructive sleep apnea) 06/26/2019   Senile calcific aortic valve sclerosis    Aortic Sclerosis-no stenosis   Ht 5' 4.6" (1.641 m)   Wt 186 lb (84.4 kg)   BMI 31.34 kg/m   Opioid Risk Score:   Fall Risk Score:  `1  Depression screen [facility] 2/9     05/03/2023   11:19 AM 03/30/2023   10:15 AM 02/23/2023   10:38 AM 01/19/2023    1:43 PM 10/25/2022    1:08 PM 09/28/2022    2:07 PM 08/29/2022    1:54 PM  Depression screen PHQ 2/9  Decreased Interest 0 0 0 0 0 0 0  Down, Depressed, Hopeless 0 0 0 0 0 0 0  PHQ - 2 Score 0 0 0 0 0 0 0      Review of Systems  Musculoskeletal:  Positive for back pain and gait problem.       B/L Knee pain   All other systems reviewed and are negative.     Objective:   Physical Exam Vitals and nursing note reviewed.  Constitutional:      Appearance: Normal appearance.  Cardiovascular:     Rate and Rhythm: Normal rate and regular rhythm.     Pulses: Normal pulses.     Heart sounds: Normal heart sounds.  Pulmonary:     Effort: Pulmonary effort is normal.     Breath sounds: Normal breath sounds.  Musculoskeletal:     Cervical back: Normal range of motion and neck supple.     Comments: Normal Muscle Bulk and Muscle Testing Reveals:  Upper Extremities: Full ROM and Muscle Strength 5/5  Thoracic Paraspinal Tenderness: T-7-T-9 Lower Extremities: Full ROM and Muscle Strength 5/5 Arises from Chair with ease Narrow Based  Gait     Skin:    General: Skin is warm and dry.  Neurological:     Mental Status: She is alert and oriented to person, place, and time.  Psychiatric:  Mood and Affect: Mood normal.        Behavior: Behavior normal.         Assessment & Plan:  1. Cervical postlaminectomy syndrome:/  Cervicalgia/ Cervical Radiculitis/ with neck and shoulder pain/ Chronic Midline Thoracic Pain.  Continue exercise routine and using heat therapy. 05/03/2023 Continue : current medication regimen. Refilled: MS Contin 15 mg one tablet three times a day #90.   We will continue the opioid monitoring program, this consists of regular clinic visits, examinations, urine drug screen, pill counts as well as use of [place] Controlled Substance Reporting system. A 12 month History has been reviewed on the [place] Controlled Substance Reporting System on 05/03/2023.   2. Fibromyalgia/ Neuropathy: Continue current medication regimen with Gabapentin, activity and exercise regime. 05/03/2023. 3. Left Knee Pain/ Mild Degenerative Changes S/P : on 12/06/2022 with Dr Wynn Banker.   LEFT Genicular nerve block x 3, Upper medial, Upper lateral , and Lower Medial under fluoroscopic guidance    On 02/09/2023: S/P LEFT KNEE Genicular nerve Radiofrequency neurotomy x 3 nerves, Upper medial, Upper lateral , and Lower Medial under fluoroscopic guidance . Continue current medication regimen with Voltaren Gel. 05/03/2023. 4. Bilateral  Ankle Pain:  No complaints today. Continue HEP as tolerated. Continue to monitor.05/03/2023. 5. Muscle Spasm: Continue current medication regimen with  Robaxin. Continue to Monitor.05/03/2023.. 6. Opioid Induced Constipation: No complaints Continue to Monitor. 05/03/2023. 7. Midline Low Back Pain: Continue current medication regime, continue HEP as tolerated. 05/03/2023. 8. Right Gluteal Medius Weakness and Atrophy: No complaints Today:Continue with Hip Abduction Exercises as tolerated: Continue to Monitor. 05/03/2023. 9. Right Hip Pain/ Right Greater Trochanteric Tenderness: No complaints today. Continue to Alternate Ice and Heat Therapy. Continue to Monitor. 05/03/2023  10. Sacroiliac Joint Pain:  No complaints today. S/P Sacroiliac Injection with good relief noted on 11/10/2022.  Continue to monitor. 05/03/2023 11. Lumbar Radiculitis: No complaints today.Continue Gabapentin. Continue HEP as tolerated. Continue current medication regimen. Continue to monitor.  05/03/2023.     F/U in 1 month

## 2023-05-08 ENCOUNTER — Other Ambulatory Visit: Payer: Self-pay | Admitting: Cardiology

## 2023-05-08 ENCOUNTER — Encounter: Payer: Self-pay | Admitting: Cardiology

## 2023-05-08 NOTE — Telephone Encounter (Signed)
Called the patient regarding MyChart message. The patient stated that for the past 30 minutes, her heart rate has been in the 140s and spiked to 160 at one point. She reported feeling very tired with little to no energy, along with slight dizziness and a mild headache. The patient mentioned that her Apple Watch is providing conflicting readings, with her heart rate showing 67-88, whereas her pulse oximeter readings are reading in the 140's. However, she does feel like her heart is racing. The patient also stated that she took an extra dose of diltiazem about 15 minutes ago.  Sherie Don, NP, recommended that the patient give the diltiazem about an hour to take effect and continue monitoring in the meantime. If her heart rate has not decreased after an hour, she should contact our office. If she develops symptoms such as feeling like she might black out or pass out, she should go to the ER for further evaluation. The patient verbalized understanding.

## 2023-05-08 NOTE — Telephone Encounter (Signed)
There is a separate MyChart message with clinic RN with further info/recommendations.

## 2023-05-08 NOTE — Telephone Encounter (Signed)
Spoke to patient and Patient reports heart rate ranges 99-mid 130. Patient stated she is waiting on call from [facility] Cardiologist office. Advised patient if symptoms return go to ED. Appointment scheduled with APP on Dec .19 2024.

## 2023-05-09 ENCOUNTER — Other Ambulatory Visit: Payer: Self-pay

## 2023-05-09 ENCOUNTER — Emergency Department (HOSPITAL_COMMUNITY)
Admission: EM | Admit: 2023-05-09 | Discharge: 2023-05-09 | Disposition: A | Discharge status: Home / Self Care | Payer: Medicare Other | Attending: Emergency Medicine | Admitting: Emergency Medicine

## 2023-05-09 ENCOUNTER — Telehealth: Payer: Self-pay

## 2023-05-09 DIAGNOSIS — R0602 Shortness of breath: Secondary | ICD-10-CM | POA: Insufficient documentation

## 2023-05-09 DIAGNOSIS — Z7901 Long term (current) use of anticoagulants: Secondary | ICD-10-CM | POA: Insufficient documentation

## 2023-05-09 DIAGNOSIS — I4811 Longstanding persistent atrial fibrillation: Secondary | ICD-10-CM | POA: Diagnosis not present

## 2023-05-09 DIAGNOSIS — I4891 Unspecified atrial fibrillation: Secondary | ICD-10-CM

## 2023-05-09 DIAGNOSIS — D6869 Other thrombophilia: Secondary | ICD-10-CM | POA: Diagnosis not present

## 2023-05-09 DIAGNOSIS — R000 Tachycardia, unspecified: Secondary | ICD-10-CM | POA: Diagnosis not present

## 2023-05-09 DIAGNOSIS — Z79899 Other long term (current) drug therapy: Secondary | ICD-10-CM | POA: Insufficient documentation

## 2023-05-09 LAB — CBC WITH DIFFERENTIAL/PLATELET
Abs Immature Granulocytes: 0.02 10*3/uL (ref 0.00–0.07)
Basophils Absolute: 0 10*3/uL (ref 0.0–0.1)
Basophils Relative: 1 %
Eosinophils Absolute: 0.1 10*3/uL (ref 0.0–0.5)
Eosinophils Relative: 2 %
HCT: 38.9 % (ref 36.0–46.0)
Hemoglobin: 13.3 g/dL (ref 12.0–15.0)
Immature Granulocytes: 0 %
Lymphocytes Relative: 27 %
Lymphs Abs: 1.3 10*3/uL (ref 0.7–4.0)
MCH: 30.9 pg (ref 26.0–34.0)
MCHC: 34.2 g/dL (ref 30.0–36.0)
MCV: 90.5 fL (ref 80.0–100.0)
Monocytes Absolute: 0.3 10*3/uL (ref 0.1–1.0)
Monocytes Relative: 5 %
Neutro Abs: 3.1 10*3/uL (ref 1.7–7.7)
Neutrophils Relative %: 65 %
Platelets: 247 10*3/uL (ref 150–400)
RBC: 4.3 MIL/uL (ref 3.87–5.11)
RDW: 11.9 % (ref 11.5–15.5)
WBC: 4.8 10*3/uL (ref 4.0–10.5)
nRBC: 0 % (ref 0.0–0.2)

## 2023-05-09 LAB — BASIC METABOLIC PANEL
Anion gap: 10 (ref 5–15)
BUN: 14 mg/dL (ref 8–23)
CO2: 25 mmol/L (ref 22–32)
Calcium: 9.6 mg/dL (ref 8.9–10.3)
Chloride: 103 mmol/L (ref 98–111)
Creatinine, Ser: 0.84 mg/dL (ref 0.44–1.00)
GFR, Estimated: >60 mL/min (ref >60)
Glucose, Bld: 210 mg/dL — ABNORMAL HIGH (ref 70–99)
Potassium: 3.9 mmol/L (ref 3.5–5.1)
Sodium: 138 mmol/L (ref 135–145)

## 2023-05-09 LAB — MAGNESIUM: Magnesium: 1.9 mg/dL (ref 1.7–2.4)

## 2023-05-09 MED ORDER — FLECAINIDE ACETATE 100 MG PO TABS: 100.0000 mg | ORAL_TABLET | Freq: Two times a day (BID) | ORAL | 0 refills | Status: DC

## 2023-05-09 MED ORDER — ETOMIDATE 2 MG/ML IV SOLN
10.0000 mg | Freq: Once | INTRAVENOUS | Status: DC
  Filled 2023-05-09: qty 10

## 2023-05-09 MED ORDER — ETOMIDATE 2 MG/ML IV SOLN
8.0000 mg | Freq: Once | INTRAVENOUS | Status: AC
  Administered 2023-05-09: 8 mg via INTRAVENOUS

## 2023-05-09 MED ORDER — METOPROLOL TARTRATE 5 MG/5ML IV SOLN
5.0000 mg | Freq: Once | INTRAVENOUS | Status: AC
  Administered 2023-05-09: 5 mg via INTRAVENOUS
  Filled 2023-05-09: qty 5

## 2023-05-09 MED ORDER — FLECAINIDE ACETATE 50 MG PO TABS
100.0000 mg | ORAL_TABLET | Freq: Once | ORAL | Status: AC
  Administered 2023-05-09: 100 mg via ORAL
  Filled 2023-05-09: qty 2

## 2023-05-09 NOTE — Discharge Instructions (Signed)
The atrial fibrillation clinic will call you to schedule a follow-up visit

## 2023-05-09 NOTE — Telephone Encounter (Signed)
Detailed instructions left on the patient's answering machine. Asked to call back with any questions. S.Aby Gessel CCT

## 2023-05-09 NOTE — ED Triage Notes (Signed)
Patient arrives for rapid heart rate since waking yesterday morning. Has not missed any doses of Eliquis. Endorses sob and headache. Takes 240 mg Cardizem every day with additional 30mg  to take q4 hours PRN and has taken her regular doses plus several PRN without relief.

## 2023-05-09 NOTE — Telephone Encounter (Signed)
Spoke to pt regarding message who states, "I feel very bad.  My HR is in the 150's and I'm very weak and have labored breathing."   She has been in contact with Sherie Don, NP (from [place] office) who recommends she go to the Emergency Room.  Pt states, "I am on my way over there, I am just waiting on my ride.  I will be going to the main [facility] ER."

## 2023-05-09 NOTE — ED Notes (Signed)
Sock delivered 1938, 200 joules

## 2023-05-09 NOTE — Telephone Encounter (Signed)
Patient seen in the ER.  See note in chart. Working on arranging A-fib clinic follow-up - per Daun Peacock  [facility]

## 2023-05-09 NOTE — Consult Note (Addendum)
    Asked to review patient with Dr. Deanna Artis -- pt  with longstanding Persistent AFib - s/p Afb Ablation  - now with 2nd episode of Afib in last 3 months (04/12/23 seen for episode in Sept).  Last episode shortly after 1st Shingles Vax -- is now s/p booster of same shot.   Presented to ER with Tachycardia ~152-156 bpm  - appears to be flutter on tele -- 5 g IV Lopressor given in ER with drop in rate to 110-120 - Afib.  Sx stared 11/11.    Is on Diltiazem & Eliquis with no missed doses. = did not break with Flecainide.  Has Myoview & Echo ordered from 10/21 visit - but no CP with HR in 150s.  Plan is to give 2nd dose 5 mg Lopressor - if not converted, will plan DCCV.   - would also have her take Flecainide 100 mg BID x 2 days to help maintain SR. - Will arrange closer Afib Clinic f/u -- has Dec f/u with Oris Drone, NP @ [facility] office in December (to f/u studies)    Bryan Lemma, MD Marykay Lex, MD, MS Bryan Lemma, M.D., M.S. Interventional Cardiologist  [facility]  Pager # [phone] Phone # [phone] [address] [place], [place] [address]

## 2023-05-09 NOTE — ED Provider Notes (Signed)
[place] EMERGENCY DEPARTMENT AT [facility] Provider Note   CSN: [id number] Arrival date & time: 05/09/23  1626     History  Chief Complaint  Patient presents with   Atrial Fibrillation    [name] is a 72 y.o. female.  72 year old female with history of A-fib presents with increased heart rate x 36 hours.  Patient is on Eliquis and has been compliant.  Also is on Cardizem for A-fib and has been compliant with that to as well.  States that she took an extra dose of Cardizem without any response.  Denies any severe chest pain or chest pressure.  Has been slightly short of breath.  No dyspnea on exertion however.  Called her doctor and told to come here       Home Medications Prior to Admission medications   Medication Sig Start Date End Date Taking? Authorizing Provider  acetaminophen (TYLENOL) 650 MG CR tablet Take 650 mg by mouth in the morning and at bedtime.    [provider]  ascorbic acid ([employer] VITAMIN C) 500 MG tablet  11/26/22   [provider]  B Complex Vitamins (VITAMIN B COMPLEX 100 IJ)  09/26/22   [provider]  cetirizine (ZYRTEC) 10 MG tablet Take 10 mg by mouth at bedtime.    [provider]  Cholecalciferol (VITAMIN D) 125 MCG (5000 UT) CAPS Take 5,000 Units by mouth every morning.     [provider]  clindamycin (CLEOCIN T) 1 % lotion Apply 1 application topically daily as needed (eczema).  06/07/19   [provider]  Coenzyme Q-10 100 MG capsule Take 100 mg by mouth every morning.     [provider]  Cyanocobalamin (B-12) 1000 MCG TABS  01/08/23   [provider]  diclofenac sodium (VOLTAREN) 1 % GEL Apply 4 g topically 4 (four) times daily as needed (joint pain).     [provider]  diltiazem (CARDIZEM CD) 240 MG 24 hr capsule TAKE 1 CAPSULE BY MOUTH EVERY DAY 11/28/22   Marykay Lex, MD  diltiazem (CARDIZEM) 30 MG tablet TAKE ONE TABLET BY MOUTH EVERY 4 HOURS  AS NEEDED FOR HEART RATE GREATER THAN 100, TOP NUMBER BLOOD PRESSURE NEEDS TO BE ABOVE 100 05/08/23   Sherie Don, NP  docusate sodium (COLACE) 250 MG capsule Take 250 mg by mouth at bedtime. 10/22/21   [provider]  ELIQUIS 5 MG TABS tablet TAKE 1 TABLET BY MOUTH TWICE  DAILY 01/04/23   Lanier Prude, MD  fluocinonide (LIDEX) 0.05 % external solution Apply 1 application topically daily as needed (eczema on scalp).  06/07/19   [provider]  gabapentin (NEURONTIN) 300 MG capsule TAKE 1 CAPSULE BY MOUTH 3 TIMES  DAILY 11/24/22   Kirsteins, Victorino Sparrow, MD  hydroxypropyl methylcellulose / hypromellose (ISOPTO TEARS / GONIOVISC) 2.5 % ophthalmic solution Place 1-2 drops into both eyes 3 (three) times daily as needed (dry/irritated eyes.).    [provider]  levothyroxine (SYNTHROID) 50 MCG tablet TAKE 1 TABLET BY MOUTH DAILY 12/26/22   Copland, Karleen Hampshire, MD  loratadine (CLARITIN) 10 MG tablet Take 10 mg by mouth every morning.     [provider]  Magnesium Bisglycinate (MAG GLYCINATE PO) Take 400 mg by mouth at bedtime.    [provider]  methocarbamol (ROBAXIN) 500 MG tablet TAKE 1 TABLET BY MOUTH EVERY 8 HOURS AS NEEDED FOR MUSCLE SPASMS 01/23/23   Jones Bales, NP  morphine (  MS CONTIN) 15 MG 12 hr tablet Take 1 tablet (15 mg total) by mouth 3 (three) times daily. 05/03/23   Jones Bales, NP  oxybutynin (DITROPAN-XL) 10 MG 24 hr tablet TAKE 1 TABLET BY MOUTH AT  BEDTIME 12/07/22   Copland, Karleen Hampshire, MD  oxymetazoline (AFRIN) 0.05 % nasal spray Place 1 spray into both nostrils 2 (two) times daily as needed for congestion.    [provider]  RABEprazole (ACIPHEX) 20 MG tablet TAKE 1 TABLET BY MOUTH EVERY DAY IN THE EVENING 01/04/23   Copland, Karleen Hampshire, MD  Resveratrol 100 MG CAPS Take 100 mg by mouth in the morning and at bedtime.    [provider]  tretinoin (RETIN-A) 0.05 % cream Apply 1 application  topically daily as needed  (acne). 06/07/19   [provider]  TYRVAYA 0.03 MG/ACT SOLN Place 1 spray into both nostrils at bedtime. 12/15/22   [provider]  Wheat Dextrin (BENEFIBER) CHEW Chew 2 tablets by mouth 2 (two) times daily as needed (regularity).    [provider]      Allergies    Cyclobenzaprine hcl, Rizatriptan benzoate, Sumatriptan, Tizanidine, Aleve [naproxen sodium], Aspartame, Clarithromycin, Corn-containing products, Doxycycline, Duloxetine, Egg [egg-derived products], Keflex [cephalexin], Metronidazole, Monosodium glutamate, Oxycodone hcl, Oxycontin [oxycodone hcl], Penicillins, Rosuvastatin, Venlafaxine, Voltaren [diclofenac sodium], Septra [sulfamethoxazole-trimethoprim], and Tramadol    Review of Systems   Review of Systems  All other systems reviewed and are negative.   Physical Exam Updated Vital Signs BP 138/78   Pulse (!) 153   Temp 98.6 F (37 C) (Oral)   Resp 14   SpO2 97%  Physical Exam Vitals and nursing note reviewed.  Constitutional:      General: She is not in acute distress.    Appearance: Normal appearance. She is well-developed. She is not toxic-appearing.  HENT:     Head: Normocephalic and atraumatic.  Eyes:     General: Lids are normal.     Conjunctiva/sclera: Conjunctivae normal.     Pupils: Pupils are equal, round, and reactive to light.  Neck:     Thyroid: No thyroid mass.     Trachea: No tracheal deviation.  Cardiovascular:     Rate and Rhythm: Tachycardia present. Rhythm irregular.     Heart sounds: Normal heart sounds. No murmur heard.    No gallop.  Pulmonary:     Effort: Pulmonary effort is normal. No respiratory distress.     Breath sounds: Normal breath sounds. No stridor. No decreased breath sounds, wheezing, rhonchi or rales.  Abdominal:     General: There is no distension.     Palpations: Abdomen is soft.     Tenderness: There is no abdominal tenderness. There is no rebound.  Musculoskeletal:        General: No  tenderness. Normal range of motion.     Cervical back: Normal range of motion and neck supple.  Skin:    General: Skin is warm and dry.     Findings: No abrasion or rash.  Neurological:     Mental Status: She is alert and oriented to person, place, and time. Mental status is at baseline.     GCS: GCS eye subscore is 4. GCS verbal subscore is 5. GCS motor subscore is 6.     Cranial Nerves: Cranial nerves are intact. No cranial nerve deficit.     Sensory: No sensory deficit.     Motor: Motor function is intact.  Psychiatric:  Attention and Perception: Attention normal.        Speech: Speech normal.        Behavior: Behavior normal.     ED Results / Procedures / Treatments   Labs (all labs ordered are listed, but only abnormal results are displayed) Labs Reviewed  CBC WITH DIFFERENTIAL/PLATELET  BASIC METABOLIC PANEL  MAGNESIUM    EKG None  Radiology No results found.  Procedures .Cardioversion  Date/Time: 05/09/2023 7:47 PM  Performed by: Lorre Nick, MD Authorized by: Lorre Nick, MD   Consent:    Consent obtained:  Written   Consent given by:  Patient   Risks discussed:  Induced arrhythmia   Alternatives discussed:  No treatment Universal protocol:    Patient identity confirmed:  Verbally with patient Pre-procedure details:    Cardioversion basis:  Emergent   Rhythm:  Atrial fibrillation   Electrode placement:  Anterior-posterior Patient sedated: Yes. Refer to sedation procedure documentation for details of sedation.  Attempt one:    Cardioversion mode:  Synchronous   Waveform:  Biphasic   Shock (Joules):  200   Shock outcome:  Conversion to normal sinus rhythm Post-procedure details:    Patient status:  Awake   Patient tolerance of procedure:  Tolerated well, no immediate complications .Sedation  Date/Time: 05/09/2023 7:48 PM  Performed by: Lorre Nick, MD Authorized by: Lorre Nick, MD   Consent:    Consent obtained:  Written    Consent given by:  Patient   Risks discussed:  Dysrhythmia   Alternatives discussed:  Analgesia without sedation Universal protocol:    Immediately prior to procedure, a time out was called: yes   Indications:    Procedure performed:  Cardioversion   Procedure necessitating sedation performed by:  Physician performing sedation Pre-sedation assessment:    Time since last food or drink:  7 hours   ASA classification: class 2 - patient with mild systemic disease     Mouth opening:  3 or more finger widths   Mallampati score:  I - soft palate, uvula, fauces, pillars visible   Neck mobility: normal     Pre-sedation assessments completed and reviewed: airway patency and cardiovascular function     Pre-sedation assessment completed:  05/09/2023 6:56 PM Immediate pre-procedure details:    Reassessment: Patient reassessed immediately prior to procedure     Reviewed: vital signs     Verified: bag valve mask available, emergency equipment available and IV patency confirmed   Procedure details (see MAR for exact dosages):    Preoxygenation:  Room air   Sedation:  Etomidate   Intended level of sedation: deep   Intra-procedure monitoring:  Blood pressure monitoring, cardiac monitor, continuous capnometry and continuous pulse oximetry   Intra-procedure events: none     Intra-procedure management:  Airway repositioning   Total Provider sedation time (minutes):  15 Post-procedure details:    Post-sedation assessment completed:  05/09/2023 7:50 PM   Attendance: Constant attendance by certified staff until patient recovered     Recovery: Patient returned to pre-procedure baseline     Patient is stable for discharge or admission: yes     Procedure completion:  Tolerated well, no immediate complications     Medications Ordered in ED Medications  metoprolol tartrate (LOPRESSOR) injection 5 mg (has no administration in time range)    ED Course/ Medical Decision Making/ A&P  Medical Decision Making Amount and/or Complexity of Data Reviewed Labs: ordered. ECG/medicine tests: ordered.  Risk Prescription drug management.   Patient here with atrial flutter on EKG per interpretation.  This was also discussed with Dr. Susette Racer of cardiology who also reviewed that as well 2.  Patient given Lopressor 5 mg x 2.  Heart rate did go from the 150s down to the 1 teens.  She however now is in A-fib.  Dr. Herbie Baltimore discussed with the patient the need for DC cardioversion.  He recommended that the patient get flecainide 100 milligrams p.o.  Patient monitored for over an hour and no change in her rhythm.  Was cardioverted as above into sinus rhythm.  Per Dr. Herbie Baltimore who is at bedside earlier, patient will be given flecainide for few days and he will arrange follow-up in A-fib clinic.  Patient has been at bedside informed of plan  CRITICAL CARE Performed by: Toy Baker Total critical care time: 75 minutes Critical care time was exclusive of separately billable procedures and treating other patients. Critical care was necessary to treat or prevent imminent or life-threatening deterioration. Critical care was time spent personally by me on the following activities: development of treatment plan with patient and/or surrogate as well as nursing, discussions with consultants, evaluation of patient's response to treatment, examination of patient, obtaining history from patient or surrogate, ordering and performing treatments and interventions, ordering and review of laboratory studies, ordering and review of radiographic studies, pulse oximetry and re-evaluation of patient's condition.         Final Clinical Impression(s) / ED Diagnoses Final diagnoses:  None    Rx / DC Orders ED Discharge Orders     None         Lorre Nick, MD 05/09/23 1951

## 2023-05-16 ENCOUNTER — Ambulatory Visit (HOSPITAL_BASED_OUTPATIENT_CLINIC_OR_DEPARTMENT_OTHER): Payer: Medicare Other

## 2023-05-16 ENCOUNTER — Ambulatory Visit (HOSPITAL_COMMUNITY): Payer: Medicare Other | Attending: Cardiology

## 2023-05-16 DIAGNOSIS — R079 Chest pain, unspecified: Secondary | ICD-10-CM

## 2023-05-16 DIAGNOSIS — R011 Cardiac murmur, unspecified: Secondary | ICD-10-CM | POA: Insufficient documentation

## 2023-05-16 DIAGNOSIS — I4811 Longstanding persistent atrial fibrillation: Secondary | ICD-10-CM | POA: Insufficient documentation

## 2023-05-16 DIAGNOSIS — I4891 Unspecified atrial fibrillation: Secondary | ICD-10-CM

## 2023-05-16 LAB — MYOCARDIAL PERFUSION IMAGING
LV dias vol: 84 mL (ref 46–106)
LV sys vol: 25 mL
Nuc Stress EF: 70 %
Peak HR: 88 {beats}/min
Rest HR: 59 {beats}/min
Rest Nuclear Isotope Dose: 11 mCi
SDS: 0
SRS: 0
SSS: 0
ST Depression (mm): 0 mm
Stress Nuclear Isotope Dose: 31.6 mCi
TID: 1.02

## 2023-05-16 LAB — ECHOCARDIOGRAM COMPLETE
Area-P 1/2: 5.34 cm2
Height: 64 in
MV M vel: 4.94 m/s
MV Peak grad: 97.6 mm[Hg]
P 1/2 time: 562 ms
Radius: 0.6 cm
S' Lateral: 2.5 cm
Weight: 2976 [oz_av]

## 2023-05-16 MED ORDER — REGADENOSON 0.4 MG/5ML IV SOLN
0.4000 mg | Freq: Once | INTRAVENOUS | Status: AC
  Administered 2023-05-16: 0.4 mg via INTRAVENOUS

## 2023-05-16 MED ORDER — TECHNETIUM TC 99M TETROFOSMIN IV KIT
31.6000 | PACK | Freq: Once | INTRAVENOUS | Status: AC | PRN
  Administered 2023-05-16: 31.6 via INTRAVENOUS

## 2023-05-16 MED ORDER — TECHNETIUM TC 99M TETROFOSMIN IV KIT
11.0000 | PACK | Freq: Once | INTRAVENOUS | Status: AC | PRN
  Administered 2023-05-16: 11 via INTRAVENOUS

## 2023-05-22 ENCOUNTER — Telehealth: Payer: Self-pay

## 2023-05-22 NOTE — Patient Outreach (Signed)
Attempted to contact patient regarding care gaps. Left voicemail for patient to return my call at [phone].  Nicholes Rough, CMA Care Guide [employer]

## 2023-05-23 ENCOUNTER — Ambulatory Visit (HOSPITAL_COMMUNITY): Payer: Medicare Other | Admitting: Physician Assistant

## 2023-05-29 ENCOUNTER — Telehealth: Payer: Self-pay

## 2023-05-29 ENCOUNTER — Ambulatory Visit: Payer: Medicare Other | Attending: Cardiology | Admitting: Cardiology

## 2023-05-29 ENCOUNTER — Encounter: Payer: Self-pay | Admitting: Cardiology

## 2023-05-29 VITALS — BP 130/62 | HR 75 | Ht 64.5 in | Wt 185.0 lb

## 2023-05-29 DIAGNOSIS — I483 Typical atrial flutter: Secondary | ICD-10-CM | POA: Diagnosis not present

## 2023-05-29 DIAGNOSIS — I10 Essential (primary) hypertension: Secondary | ICD-10-CM

## 2023-05-29 DIAGNOSIS — I4819 Other persistent atrial fibrillation: Secondary | ICD-10-CM | POA: Diagnosis not present

## 2023-05-29 NOTE — Progress Notes (Signed)
Electrophysiology Office Follow up Visit Note:    Date:  05/29/2023   ID:  [name], DOB 10-24-1950, MRN [id number]  PCP:  Hannah Beat, MD  [facility] Cardiologist:  Bryan Lemma, MD  [facility] Electrophysiologist:  Lanier Prude, MD    Interval History:     [name] is a 72 y.o. female who presents for a follow up visit.   She has a history of atrial fibrillation and underwent catheter ablation December 16, 2021.  During that procedure the veins and posterior wall were isolated. She saw Rosalita Chessman in clinic on April 12, 2023.  That appointment occurred shortly after an episode of symptomatic atrial fibrillation.  For that episode she took a flecainide she had at home.  At the appointment with Rosalita Chessman she reported almost daily episodes of symptomatic palpitations in the morning.  She has been taking diltiazem for this.  She is on Eliquis for stroke prophylaxis.  She is currently taking diltiazem to 40 mg by mouth once daily, Eliquis, flecainide 100 mg by mouth twice daily.  Discussed the use of AI scribe software for clinical note transcription with the patient, who gave verbal consent to proceed.  History of Present Illness   The patient, with a history of atrial fibrillation and ablation, presents with recurrent episodes of high heart rate. She associates these episodes with receiving the shingles vaccine, as the first episode occurred after the first dose and the second episode, which required cardioversion at the hospital, occurred after the second dose. Since the hospital visit, the patient reports feeling generally well, with heart rates occasionally reaching around 120 bpm during activity but returning to 80-90 bpm at rest. She is currently on Eliquis twice daily and Cardizem 240mg  daily. She has had a stress test and echo, both of which looked good. She also reports occasional lightheadedness, particularly when bending over and standing up.             Past medical, surgical, social and family history were reviewed.  ROS:   Please see the history of present illness.    All other systems reviewed and are negative.  EKGs/Labs/Other Studies Reviewed:    The following studies were reviewed today:  May 16, 2023 echo EF 60 to 65% RV function normal Mildly dilated left atrium Mild to moderate MR Mild AI  May 16, 2023 SPECT Low risk study without ischemia  May 09, 2023 EKG shows typical appearing atrial flutter       Physical Exam:    VS:  BP 130/62   Pulse 75   Ht 5' 4.5" (1.638 m)   Wt 185 lb (83.9 kg)   SpO2 96%   BMI 31.26 kg/m     Wt Readings from Last 3 Encounters:  05/29/23 185 lb (83.9 kg)  05/16/23 186 lb (84.4 kg)  05/03/23 186 lb (84.4 kg)     Physical Exam   GENERAL: Well appearing, no distress, positioned in bed at 90 degrees. CHEST: Lungs clear to auscultation. CARDIOVASCULAR: Regular rate and rhythm, no increased work of breathing.          ASSESSMENT:    1. Persistent atrial fibrillation (HCC)   2. Typical atrial flutter (HCC)   3. Primary hypertension    PLAN:    In order of problems listed above:  Assessment and Plan  #Persistent atrial fibrillation and flutter Now with recurrence.  I discussed treatment options for her recurrent arrhythmia including antiarrhythmic drugs and catheter ablation.  Discussed  treatment options today for AF including antiarrhythmic drug therapy and ablation. Discussed risks, recovery and likelihood of success with each treatment strategy. Risk, benefits, and alternatives to EP study and ablation for afib were discussed. These risks include but are not limited to stroke, bleeding, vascular damage, tamponade, perforation, damage to the esophagus, lungs, phrenic nerve and other structures, pulmonary vein stenosis, worsening renal function, coronary vasospasm and death.  Discussed potential need for repeat ablation procedures and antiarrhythmic  drugs after an initial ablation. The patient understands these risk and wishes to proceed.  We will therefore proceed with catheter ablation at the next available time.  Carto, ICE, anesthesia are requested for the procedure.  Will also obtain CT PV protocol prior to the procedure to exclude LAA thrombus and further evaluate atrial anatomy.  Ablation strategy would be repeat PVI and posterior wall plus CTI.   #Hypertension At goal today.  Recommend checking blood pressures 1-2 times per week at home and recording the values.  Recommend bringing these recordings to the primary care physician.       Signed, Steffanie Dunn, MD, [facility], [facility] 05/29/2023 8:03 PM    Electrophysiology [facility]

## 2023-05-29 NOTE — Patient Instructions (Signed)
Medication Instructions:  Your physician recommends that you continue on your current medications as directed. Please refer to the Current Medication list given to you today.  *If you need a refill on your cardiac medications before your next appointment, please call your pharmacy*   Lab Work: BMET and CBC - prior to CT scan   Testing/Procedures: Cardiac CT: Your physician has requested that you have cardiac CT. Cardiac computed tomography (CT) is a painless test that uses an x-ray machine to take clear, detailed pictures of your heart. For further information please visit [email or web address] Please follow instruction sheet as given. We will call you to schedule your CT scan. It will be done about 2-3 weeks prior to your ablation.  Ablation: Your physician has recommended that you have an ablation. Catheter ablation is a medical procedure used to treat some cardiac arrhythmias (irregular heartbeats). During catheter ablation, a long, thin, flexible tube is put into a blood vessel in your groin (upper thigh), or neck. This tube is called an ablation catheter. It is then guided to your heart through the blood vessel. Radio frequency waves destroy small areas of heart tissue where abnormal heartbeats may cause an arrhythmia to start. Please see the instruction sheet given to you today.  Follow-Up: At [facility], you and your health needs are our priority.  As part of our continuing mission to provide you with exceptional heart care, we have created designated Provider Care Teams.  These Care Teams include your primary Cardiologist (physician) and Advanced Practice Providers (APPs -  Physician Assistants and Nurse Practitioners) who all work together to provide you with the care you need, when you need it.  Your next appointment:   We will call you to arrange your follow up appointments

## 2023-05-29 NOTE — Telephone Encounter (Signed)
Transition Care Management Follow-up Telephone Call Date of discharge and from where: [facility] 11/12 How have you been since you were released from the hospital? Doing well and following up with providers  Any questions or concerns? No  Items Reviewed: Did the pt receive and understand the discharge instructions provided? Yes  Medications obtained and verified? Yes  Other? No  Any new allergies since your discharge? No  Dietary orders reviewed? No Do you have support at home? Yes     Follow up appointments reviewed:  PCP Hospital f/u appt confirmed? Yes  Scheduled to see  on  @ . Specialist Hospital f/u appt confirmed? Yes  Scheduled to see Cardiologist on 12/2 @ . Are transportation arrangements needed? No  If their condition worsens, is the pt aware to call PCP or go to the Emergency Dept.? Yes Was the patient provided with contact information for the PCP's office or ED? Yes Was to pt encouraged to call back with questions or concerns? Yes

## 2023-05-30 NOTE — Progress Notes (Signed)
Subjective:    Patient ID: [name], female    DOB: 16-Mar-1951, 72 y.o.   MRN: [id number]  HPI: [name] is a 72 y.o. female who returns for follow up appointment for chronic pain and medication refill. She states her  pain is located in her mid- back and left knee. She rates her pain 4. Her current exercise regime is walking and performing stretching exercises.  Ms. Faulk Morphine equivalent is 45.00 MME.   Last UDS was Performed on 02/23/2023, it was consistent.      Pain Inventory Average Pain 4 Pain Right Now 4 My pain is constant, burning, dull, and aching  In the last 24 hours, has pain interfered with the following? General activity 4 Relation with others 4 Enjoyment of life 4 What TIME of day is your pain at its worst? evening and night Sleep (in general) Good  Pain is worse with: walking, bending, standing, and some activites Pain improves with: rest, heat/ice, and medication Relief from Meds: 6  Family History  Problem Relation Age of Onset   Breast cancer Mother    Diabetes Mother    Heart disease Mother    Allergies Mother    Diabetes Father    Heart disease Father    Emphysema Father    Allergies Father    Breast cancer Paternal Aunt    Colon cancer Neg Hx    Esophageal cancer Neg Hx    Rectal cancer Neg Hx    Stomach cancer Neg Hx    Social History   Socioeconomic History   Marital status: Widowed    Spouse name: Not on file   Number of children: Not on file   Years of education: Not on file   Highest education level: Not on file  Occupational History   Occupation: Catering manager   Occupation: retired  Tobacco Use   Smoking status: Never   Smokeless tobacco: Never   Tobacco comments:    Never smoke 01/13/22  Vaping Use   Vaping status: Never Used  Substance and Sexual Activity   Alcohol use: Yes    Alcohol/week: 1.0 standard drink of alcohol    Types: 1 Glasses of wine per week    Comment: rare   Drug use: Never   Sexual  activity: Not Currently  Other Topics Concern   Not on file  Social History Narrative   Not on file   Social Determinants of Health   Financial Resource Strain: Low Risk  (01/19/2023)   Overall Financial Resource Strain (CARDIA)    Difficulty of Paying Living Expenses: Not hard at all  Food Insecurity: No Food Insecurity (01/19/2023)   Hunger Vital Sign    Worried About Running Out of Food in the Last Year: Never true    Ran Out of Food in the Last Year: Never true  Transportation Needs: No Transportation Needs (01/19/2023)   PRAPARE - Administrator, Civil Service (Medical): No    Lack of Transportation (Non-Medical): No  Physical Activity: Insufficiently Active (01/19/2023)   Exercise Vital Sign    Days of Exercise per Week: 7 days    Minutes of Exercise per Session: 20 min  Stress: No Stress Concern Present (01/19/2023)   [employer] of Occupational Health - Occupational Stress Questionnaire    Feeling of Stress : Not at all  Social Connections: Moderately Integrated (01/19/2023)   Social Connection and Isolation Panel [NHANES]    Frequency of Communication with Friends and Family:  More than three times a week    Frequency of Social Gatherings with Friends and Family: More than three times a week    Attends Religious Services: More than 4 times per year    Active Member of Clubs or Organizations: Yes    Attends Banker Meetings: More than 4 times per year    Marital Status: Widowed   Past Surgical History:  Procedure Laterality Date   45 HOUR PH STUDY N/A 01/04/2016   Procedure: 24 HOUR PH STUDY;  Surgeon: Ruffin Frederick, MD;  Location: [facility];  Service: Gastroenterology;  Laterality: N/A;   ABDOMINAL HYSTERECTOMY  1988   ADENOIDECTOMY     ANTERIOR CERVICAL DECOMP/DISCECTOMY FUSION  2009, 1993   first procedure in ~ 1993/Dr Botero   APPENDECTOMY  1988   ATRIAL FIBRILLATION ABLATION N/A 12/16/2021   Procedure: ATRIAL FIBRILLATION  ABLATION;  Surgeon: Lanier Prude, MD;  Location: [facility];  Service: Cardiovascular;  Laterality: N/A;   BACK SURGERY     CARDIOVERSION N/A 04/03/2019   Procedure: CARDIOVERSION;  Surgeon: Lars Masson, MD;  Location: [facility];  Service: Cardiovascular;  Laterality: N/A;   CHOLECYSTECTOMY  2005   ESOPHAGEAL MANOMETRY N/A 01/04/2016   Procedure: ESOPHAGEAL MANOMETRY (EM) 24 HR PH;  Surgeon: Ruffin Frederick, MD;  Location: [facility];  Service: Gastroenterology;  Laterality: N/A;   ESOPHAGOGASTRODUODENOSCOPY (EGD) WITH PROPOFOL N/A 05/17/2016   Procedure: ESOPHAGOGASTRODUODENOSCOPY (EGD) WITH PROPOFOL;  Surgeon: Ruffin Frederick, MD;  Location: [facility];  Service: Gastroenterology;  Laterality: N/A;   FOOT SURGERY Left 08/2022   NASAL SINUS SURGERY  2006   SAVORY DILATION N/A 05/17/2016   Procedure: SAVORY DILATION;  Surgeon: Ruffin Frederick, MD;  Location: [facility];  Service: Gastroenterology;  Laterality: N/A;   TEE WITHOUT CARDIOVERSION N/A 04/03/2019   Procedure: TRANSESOPHAGEAL ECHOCARDIOGRAM (TEE)-with DCCV;  Surgeon: Lars Masson, MD;  Location: [facility] ENDOSCOPY;; EF 55 to 60%.  No LVH.  Normal RV.  Moderate LA dilation.  No thrombus.  Mild RA dilation.  Mild to moderate TR.  Mild aortic valve sclerosis, no stenosis.  Moderately elevated PA pressures (47 mmHg)   TONSILLECTOMY AND ADENOIDECTOMY     TRANSTHORACIC ECHOCARDIOGRAM  2009   Normal LV size and function. EF 60-65%. No regional wall motion and amount is. Mild aortic sclerosis, no stenosis   TUBAL LIGATION  1979   Past Surgical History:  Procedure Laterality Date   30 HOUR PH STUDY N/A 01/04/2016   Procedure: 24 HOUR PH STUDY;  Surgeon: Ruffin Frederick, MD;  Location: [facility];  Service: Gastroenterology;  Laterality: N/A;   ABDOMINAL HYSTERECTOMY  1988   ADENOIDECTOMY     ANTERIOR CERVICAL DECOMP/DISCECTOMY FUSION  2009, 1993   first procedure in ~ 1993/Dr  Botero   APPENDECTOMY  1988   ATRIAL FIBRILLATION ABLATION N/A 12/16/2021   Procedure: ATRIAL FIBRILLATION ABLATION;  Surgeon: Lanier Prude, MD;  Location: [facility];  Service: Cardiovascular;  Laterality: N/A;   BACK SURGERY     CARDIOVERSION N/A 04/03/2019   Procedure: CARDIOVERSION;  Surgeon: Lars Masson, MD;  Location: [facility] ENDOSCOPY;  Service: Cardiovascular;  Laterality: N/A;   CHOLECYSTECTOMY  2005   ESOPHAGEAL MANOMETRY N/A 01/04/2016   Procedure: ESOPHAGEAL MANOMETRY (EM) 24 HR PH;  Surgeon: Ruffin Frederick, MD;  Location: [facility];  Service: Gastroenterology;  Laterality: N/A;   ESOPHAGOGASTRODUODENOSCOPY (EGD) WITH PROPOFOL N/A 05/17/2016   Procedure: ESOPHAGOGASTRODUODENOSCOPY (EGD) WITH PROPOFOL;  Surgeon: Ruffin Frederick, MD;  Location: [facility];  Service: Gastroenterology;  Laterality: N/A;   FOOT SURGERY Left 08/2022   NASAL SINUS SURGERY  2006   SAVORY DILATION N/A 05/17/2016   Procedure: SAVORY DILATION;  Surgeon: Ruffin Frederick, MD;  Location: [facility];  Service: Gastroenterology;  Laterality: N/A;   TEE WITHOUT CARDIOVERSION N/A 04/03/2019   Procedure: TRANSESOPHAGEAL ECHOCARDIOGRAM (TEE)-with DCCV;  Surgeon: Lars Masson, MD;  Location: [facility] ENDOSCOPY;; EF 55 to 60%.  No LVH.  Normal RV.  Moderate LA dilation.  No thrombus.  Mild RA dilation.  Mild to moderate TR.  Mild aortic valve sclerosis, no stenosis.  Moderately elevated PA pressures (47 mmHg)   TONSILLECTOMY AND ADENOIDECTOMY     TRANSTHORACIC ECHOCARDIOGRAM  2009   Normal LV size and function. EF 60-65%. No regional wall motion and amount is. Mild aortic sclerosis, no stenosis   TUBAL LIGATION  1979   Past Medical History:  Diagnosis Date   B12 deficiency    Complication of anesthesia    slow to wake up    Depression    Diabetes mellitus type 2, diet-controlled (HCC) 07/24/2018   Fibromyalgia    Generalized hyperhidrosis 04/17/2014   GERD  (gastroesophageal reflux disease)    History of rheumatic fever as a child    Hyperlipemia    Hypertension    denies htn on 05/09/16   HYPOTHYROIDISM 05/29/2007   Irritable bowel syndrome    Migraines    hx of    OSA (obstructive sleep apnea) 06/26/2019   Senile calcific aortic valve sclerosis    Aortic Sclerosis-no stenosis   There were no vitals taken for this visit.  Opioid Risk Score:   Fall Risk Score:  `1  Depression screen [facility] 2/9     05/03/2023   11:19 AM 03/30/2023   10:15 AM 02/23/2023   10:38 AM 01/19/2023    1:43 PM 10/25/2022    1:08 PM 09/28/2022    2:07 PM 08/29/2022    1:54 PM  Depression screen PHQ 2/9  Decreased Interest 0 0 0 0 0 0 0  Down, Depressed, Hopeless 0 0 0 0 0 0 0  PHQ - 2 Score 0 0 0 0 0 0 0    Review of Systems  Musculoskeletal:  Positive for back pain and neck pain.  All other systems reviewed and are negative.     Objective:   Physical Exam Vitals and nursing note reviewed.  Constitutional:      Appearance: Normal appearance.  Cardiovascular:     Rate and Rhythm: Normal rate and regular rhythm.     Pulses: Normal pulses.     Heart sounds: Normal heart sounds.  Pulmonary:     Effort: Pulmonary effort is normal.     Breath sounds: Normal breath sounds.  Musculoskeletal:     Comments: Normal Muscle Bulk and Muscle Testing Reveals:  Upper Extremities: Full ROM and Muscle Strength 5/5 Thoracic Paraspinal Tenderness: T-7-T-10  Lower Extremities : Full ROM and Muscle Strength 5/5 Left Lower extremity Flexion Produces Pain into her Left Patella Arises from chair with ease Narrow Based Gait     Skin:    General: Skin is warm and dry.  Neurological:     Mental Status: She is alert and oriented to person, place, and time.  Psychiatric:        Mood and Affect: Mood normal.        Behavior: Behavior normal.  Assessment & Plan:  1. Cervical postlaminectomy syndrome:/ Cervicalgia/ Cervical Radiculitis/ with neck and shoulder  pain/ Chronic Midline Thoracic Pain.  Continue exercise routine and using heat therapy. 05/31/2023 Continue : current medication regimen. Refilled: MS Contin 15 mg one tablet three times a day #90.   We will continue the opioid monitoring program, this consists of regular clinic visits, examinations, urine drug screen, pill counts as well as use of [place] Controlled Substance Reporting system. A 12 month History has been reviewed on the [place] Controlled Substance Reporting System on 05/31/2023.   2. Fibromyalgia/ Neuropathy: Continue current medication regimen with Gabapentin, activity and exercise regime. 05/31/2023. 3. Left Knee Pain/ Mild Degenerative Changes S/P : on 12/06/2022 with Dr Wynn Banker.   LEFT Genicular nerve block x 3, Upper medial, Upper lateral , and Lower Medial under fluoroscopic guidance    On 02/09/2023: S/P LEFT KNEE Genicular nerve Radiofrequency neurotomy x 3 nerves, Upper medial, Upper lateral , and Lower Medial under fluoroscopic guidance . Continue current medication regimen with Voltaren Gel. 05/31/2023. 4. Bilateral  Ankle Pain:  No complaints today. Continue HEP as tolerated. Continue to monitor.05/31/2023. 5. Muscle Spasm: Continue current medication regimen with  Robaxin. Continue to Monitor.05/31/2023.. 6. Opioid Induced Constipation: No complaints Continue to Monitor. 05/31/2023. 7. Midline Low Back Pain: Continue current medication regime, continue HEP as tolerated. 05/31/2023. 8. Right Gluteal Medius Weakness and Atrophy: No complaints Today:Continue with Hip Abduction Exercises as tolerated: Continue to Monitor. 05/31/2023. 9. Right Hip Pain/ Right Greater Trochanteric Tenderness: No complaints today. Continue to Alternate Ice and Heat Therapy. Continue to Monitor. 05/31/2023  10. Sacroiliac Joint Pain:  No complaints today. S/P Sacroiliac Injection with good relief noted on 11/10/2022. Continue to monitor. 05/31/2023 11. Lumbar Radiculitis: No  complaints today.Continue Gabapentin. Continue HEP as tolerated. Continue current medication regimen. Continue to monitor.  05/31/2023.     F/U in 1 month

## 2023-05-31 ENCOUNTER — Ambulatory Visit (HOSPITAL_COMMUNITY): Payer: Medicare Other | Admitting: Physician Assistant

## 2023-05-31 ENCOUNTER — Encounter (HOSPITAL_COMMUNITY): Payer: Self-pay

## 2023-05-31 ENCOUNTER — Encounter: Payer: Medicare Other | Attending: Physical Medicine & Rehabilitation | Admitting: Registered Nurse

## 2023-05-31 VITALS — BP 121/73 | HR 67 | Ht 64.0 in | Wt 184.0 lb

## 2023-05-31 DIAGNOSIS — G894 Chronic pain syndrome: Secondary | ICD-10-CM

## 2023-05-31 DIAGNOSIS — Z79891 Long term (current) use of opiate analgesic: Secondary | ICD-10-CM | POA: Diagnosis not present

## 2023-05-31 DIAGNOSIS — M1712 Unilateral primary osteoarthritis, left knee: Secondary | ICD-10-CM

## 2023-05-31 DIAGNOSIS — Z5181 Encounter for therapeutic drug level monitoring: Secondary | ICD-10-CM | POA: Diagnosis present

## 2023-05-31 DIAGNOSIS — G8929 Other chronic pain: Secondary | ICD-10-CM

## 2023-05-31 DIAGNOSIS — M546 Pain in thoracic spine: Secondary | ICD-10-CM | POA: Insufficient documentation

## 2023-05-31 MED ORDER — MORPHINE SULFATE ER 15 MG PO TBCR: 15.0000 mg | EXTENDED_RELEASE_TABLET | Freq: Three times a day (TID) | ORAL | 0 refills | Status: DC

## 2023-06-05 ENCOUNTER — Ambulatory Visit (INDEPENDENT_AMBULATORY_CARE_PROVIDER_SITE_OTHER): Payer: Medicare Other | Admitting: Primary Care

## 2023-06-05 ENCOUNTER — Encounter: Payer: Self-pay | Admitting: Registered Nurse

## 2023-06-05 ENCOUNTER — Encounter: Payer: Self-pay | Admitting: Primary Care

## 2023-06-05 VITALS — BP 132/60 | HR 63 | Temp 97.6°F | Ht 64.0 in | Wt 185.0 lb

## 2023-06-05 DIAGNOSIS — G4733 Obstructive sleep apnea (adult) (pediatric): Secondary | ICD-10-CM

## 2023-06-05 NOTE — Patient Instructions (Signed)
-  OBSTRUCTIVE SLEEP APNEA (OSA): Obstructive Sleep Apnea is a condition where the airway becomes blocked during sleep, causing breathing to stop and start repeatedly. Given your significant weight loss and discontinuation of BiPAP therapy, we will order a repeat sleep study to reassess the presence and severity of OSA. If OSA is still present, we will discuss potential treatment options, including the Inspire device if you remain intolerant to CPAP/BiPAP.  -ATRIAL FIBRILLATION/ATRIAL FLUTTER: Atrial Fibrillation and Atrial Flutter are irregular heartbeats that can lead to blood clots, stroke, and other heart-related complications. You are scheduled for a cardiac ablation procedure on January 2nd, and we will continue with your current cardiac management plan as directed by your cardiologist.  Orders: Split night sleep study   Follow-up: 3 months with [name] NP or sooner (if sleep study is negative we can cancel apt)

## 2023-06-05 NOTE — Progress Notes (Signed)
@Patient  ID: [name], female    DOB: 12/21/1950, 72 y.o.   MRN: [id number]  No chief complaint on file.   Referring provider: Hannah Beat, MD  HPI: 72 year old female, never smoked. PMH significant for OSA, chronic respiratory failure secondary to sleep related hypoxia, fibromyalgia, GERD, HTN, HLD, hypothyroidism, depression, migraine headaches, DM, Afib, vitamin B12 deficiency. Patient of Dr. Craige Cotta, last seen on 06/10/19 for video visit.   HST 05/28/19 >> AHI 26.9, SpO2 low 64%. Arranged for in-lab CPAP titration study to determine if she needs BIPAP +/- supplemental oxygen. Continues 2L nocturnal oxygen at this time pending titration study. Bipap 06/26/19 >> Bipap 17/13 cm H2O. She didn't need supplemental oxygen once she was on appropriate Bipap settings. DME company is [employer].     06/05/2023- Interim hx  Discussed the use of AI scribe software for clinical note transcription with the patient, who gave verbal consent to proceed.  History of Present Illness   Patient presents today for an overdue follow-up OSA on BIPAP. During last OV with Dr. Craige Cotta in December 2021. She has lost 50 lbs in the last 2 years. She discontinued BiPAP therapy approximately 16 months ago and has been sleeping well since, with the exception of frequent nocturnal awakenings due to urinary urgency. The patient typically goes to bed around 10 or 11 PM, wakes up around midnight to take medication, and then returns to sleep without difficulty. The frequency of nocturnal awakenings varies from one to six times per night. The patient usually starts her day around 7:30 or 8 AM and does not report feeling tired upon waking or falling asleep easily during the day.  The patient has a history of snoring, but it is unclear if this is still an issue as she currently sleeps alone. She sleeps on her back due to a previous back injury and denies waking up gasping for air. The patient has had two previous sleep studies; the  first resulted in a diagnosis of sleep apnea and the initiation of oxygen therapy, while the second led to the prescription of BiPAP therapy. The patient discontinued BiPAP due to discomfort and noise, which interfered with her ability to care for her spouse who was on hospice care at home.  The patient also has a history of atrial fibrillation and underwent a cardiac ablation approximately 15 months ago. However, she recently experienced an episode of atrial fibrillation following a shingles vaccine and required a cardioversion. A recent echo and stress test revealed atrial flutter, and the patient is scheduled for another cardiac ablation. The patient denies any underlying pulmonary conditions.        Allergies  Allergen Reactions   Cyclobenzaprine Hcl Anaphylaxis and Swelling    Reaction - throat closed, tongue swelling   Rizatriptan Benzoate Anaphylaxis    Throat and Tongue Swelling Closed   Sumatriptan Swelling    Throat closed & Tongue Swelling   Tizanidine Shortness Of Breath   Aleve [Naproxen Sodium] Hives   Aspartame Other (See Comments)   Clarithromycin Hives   Corn-Containing Products Other (See Comments)    Head congestion   Doxycycline Diarrhea and Nausea And Vomiting   Duloxetine Nausea Only   Egg [Egg-Derived Products] Diarrhea and Nausea And Vomiting   Keflex [Cephalexin] Itching and Other (See Comments)    Sores   Metronidazole Diarrhea and Nausea Only   Monosodium Glutamate Diarrhea and Other (See Comments)   Oxycodone Hcl Diarrhea and Nausea And Vomiting   Oxycontin [Oxycodone Hcl]  Nausea And Vomiting   Penicillins Other (See Comments)    Unknown reaction Did it involve swelling of the face/tongue/throat, SOB, or low BP? Unknown Did it involve sudden or severe rash/hives, skin peeling, or any reaction on the inside of your mouth or nose? Unknown Did you need to seek medical attention at a hospital or doctor's office? Unknown When did it last happen?   infant    If all above answers are "NO", may proceed with cephalosporin use.   Rosuvastatin Other (See Comments)    Muscle aches   Venlafaxine Hives   Voltaren [Diclofenac Sodium] Hives    Reaction to oral med, uses gel occasionally   Septra [Sulfamethoxazole-Trimethoprim] Itching and Rash   Tramadol Diarrhea, Nausea And Vomiting and Palpitations    Immunization History  Administered Date(s) Administered   PFIZER(Purple Top)SARS-COV-2 Vaccination 08/22/2019, 09/18/2019   Pneumococcal Conjugate-13 01/02/2017   Pneumococcal Polysaccharide-23 05/06/2013, 07/25/2018   Td 06/28/2003, 10/16/2013   Zoster, Live 11/07/2012    Past Medical History:  Diagnosis Date   B12 deficiency    Complication of anesthesia    slow to wake up    Depression    Diabetes mellitus type 2, diet-controlled (HCC) 07/24/2018   Fibromyalgia    Generalized hyperhidrosis 04/17/2014   GERD (gastroesophageal reflux disease)    History of rheumatic fever as a child    Hyperlipemia    Hypertension    denies htn on 05/09/16   HYPOTHYROIDISM 05/29/2007   Irritable bowel syndrome    Migraines    hx of    OSA (obstructive sleep apnea) 06/26/2019   Senile calcific aortic valve sclerosis    Aortic Sclerosis-no stenosis    Tobacco History: Social History   Tobacco Use  Smoking Status Never  Smokeless Tobacco Never  Tobacco Comments   Never smoke 01/13/22   Counseling given: Not Answered Tobacco comments: Never smoke 01/13/22   Outpatient Medications Prior to Visit  Medication Sig Dispense Refill   acetaminophen (TYLENOL) 650 MG CR tablet Take 650 mg by mouth in the morning and at bedtime.     ascorbic acid ([employer] VITAMIN C) 500 MG tablet      B Complex Vitamins (VITAMIN B COMPLEX 100 IJ)      cetirizine (ZYRTEC) 10 MG tablet Take 10 mg by mouth at bedtime.     Cholecalciferol (VITAMIN D) 125 MCG (5000 UT) CAPS Take 5,000 Units by mouth every morning.      clindamycin (CLEOCIN T) 1 % lotion Apply 1 application  topically daily as needed (eczema).      Coenzyme Q-10 100 MG capsule Take 100 mg by mouth every morning.      Cyanocobalamin (B-12) 1000 MCG TABS      diclofenac sodium (VOLTAREN) 1 % GEL Apply 4 g topically 4 (four) times daily as needed (joint pain).      diltiazem (CARDIZEM CD) 240 MG 24 hr capsule TAKE 1 CAPSULE BY MOUTH EVERY DAY 90 capsule 2   diltiazem (CARDIZEM) 30 MG tablet TAKE ONE TABLET BY MOUTH EVERY 4 HOURS AS NEEDED FOR HEART RATE GREATER THAN 100, TOP NUMBER BLOOD PRESSURE NEEDS TO BE ABOVE 100 45 tablet 3   docusate sodium (COLACE) 250 MG capsule Take 250 mg by mouth at bedtime.     ELIQUIS 5 MG TABS tablet TAKE 1 TABLET BY MOUTH TWICE  DAILY 180 tablet 3   fluocinonide (LIDEX) 0.05 % external solution Apply 1 application topically daily as needed (eczema on scalp).  gabapentin (NEURONTIN) 300 MG capsule TAKE 1 CAPSULE BY MOUTH 3 TIMES  DAILY 270 capsule 3   hydroxypropyl methylcellulose / hypromellose (ISOPTO TEARS / GONIOVISC) 2.5 % ophthalmic solution Place 1-2 drops into both eyes 3 (three) times daily as needed (dry/irritated eyes.).     levothyroxine (SYNTHROID) 50 MCG tablet TAKE 1 TABLET BY MOUTH DAILY 90 tablet 3   loratadine (CLARITIN) 10 MG tablet Take 10 mg by mouth every morning.      Magnesium Bisglycinate (MAG GLYCINATE PO) Take 400 mg by mouth at bedtime.     methocarbamol (ROBAXIN) 500 MG tablet TAKE 1 TABLET BY MOUTH EVERY 8 HOURS AS NEEDED FOR MUSCLE SPASMS 270 tablet 3   morphine (MS CONTIN) 15 MG 12 hr tablet Take 1 tablet (15 mg total) by mouth 3 (three) times daily. 90 tablet 0   oxybutynin (DITROPAN-XL) 10 MG 24 hr tablet TAKE 1 TABLET BY MOUTH AT  BEDTIME 90 tablet 3   oxymetazoline (AFRIN) 0.05 % nasal spray Place 1 spray into both nostrils 2 (two) times daily as needed for congestion.     RABEprazole (ACIPHEX) 20 MG tablet TAKE 1 TABLET BY MOUTH EVERY DAY IN THE EVENING 90 tablet 3   Resveratrol 100 MG CAPS Take 100 mg by mouth in the morning and at  bedtime.     tretinoin (RETIN-A) 0.05 % cream Apply 1 application  topically daily as needed (acne).     TYRVAYA 0.03 MG/ACT SOLN Place 1 spray into both nostrils at bedtime. (Patient not taking: Reported on 05/29/2023)     Wheat Dextrin (BENEFIBER) CHEW Chew 2 tablets by mouth 2 (two) times daily as needed (regularity).     No facility-administered medications prior to visit.    Review of Systems  Review of Systems  Constitutional: Negative.  Negative for fatigue.  HENT: Negative.    Respiratory: Negative.    Cardiovascular: Negative.  Negative for chest pain and palpitations.   Physical Exam  There were no vitals taken for this visit. Physical Exam Constitutional:      Appearance: Normal appearance.  HENT:     Head: Normocephalic and atraumatic.     Mouth/Throat:     Mouth: Mucous membranes are moist.     Pharynx: Oropharynx is clear.  Cardiovascular:     Rate and Rhythm: Normal rate.  Pulmonary:     Effort: Pulmonary effort is normal.     Breath sounds: Normal breath sounds.  Musculoskeletal:        General: Normal range of motion.  Skin:    General: Skin is warm and dry.  Neurological:     General: No focal deficit present.     Mental Status: She is alert and oriented to person, place, and time. Mental status is at baseline.  Psychiatric:        Mood and Affect: Mood normal.        Behavior: Behavior normal.        Thought Content: Thought content normal.        Judgment: Judgment normal.      Lab Results:  CBC    Component Value Date/Time   WBC 4.8 05/09/2023 1718   RBC 4.30 05/09/2023 1718   HGB 13.3 05/09/2023 1718   HGB 12.1 03/15/2023 1536   HCT 38.9 05/09/2023 1718   HCT 35.7 03/15/2023 1536   PLT 247 05/09/2023 1718   PLT 236 03/15/2023 1536   MCV 90.5 05/09/2023 1718   MCV 90 03/15/2023 1536  MCH 30.9 05/09/2023 1718   MCHC 34.2 05/09/2023 1718   RDW 11.9 05/09/2023 1718   RDW 11.8 03/15/2023 1536   LYMPHSABS 1.3 05/09/2023 1718    LYMPHSABS 1.9 03/30/2018 1114   MONOABS 0.3 05/09/2023 1718   EOSABS 0.1 05/09/2023 1718   EOSABS 0.2 03/30/2018 1114   BASOSABS 0.0 05/09/2023 1718   BASOSABS 0.0 03/30/2018 1114    BMET    Component Value Date/Time   NA 138 05/09/2023 1718   NA 143 03/15/2023 1536   K 3.9 05/09/2023 1718   CL 103 05/09/2023 1718   CO2 25 05/09/2023 1718   GLUCOSE 210 (H) 05/09/2023 1718   BUN 14 05/09/2023 1718   BUN 12 03/15/2023 1536   CREATININE 0.84 05/09/2023 1718   CALCIUM 9.6 05/09/2023 1718   GFRNONAA >60 05/09/2023 1718   GFRAA >60 08/29/2019 1615    BNP No results found for: "BNP"  ProBNP No results found for: "PROBNP"  Imaging: ECHOCARDIOGRAM COMPLETE  Result Date: 05/16/2023    ECHOCARDIOGRAM REPORT   Patient Name:   ARYAN YONKER Date of Exam: 05/16/2023 Medical Rec #:  [id number]      Height:       64.0 in Accession #:    [id number]     Weight:       186.0 lb Date of Birth:  11-Mar-1951       BSA:          1.897 m Patient Age:    72 years       BP:           144/70 mmHg Patient Gender: F              HR:           66 bpm. Exam Location:  [address] Procedure: 2D Echo, 3D Echo, Cardiac Doppler, Color Doppler and Strain Analysis Indications:    I48.91 Atrial Fibrillation  History:        Patient has prior history of Echocardiogram examinations, most                 recent 12/25/2007. Signs/Symptoms:Murmur and Chest Pain; Risk                 Factors:HLD.  Sonographer:    Clearence Ped RCS Referring Phys: [id number] DAVID W HARDING IMPRESSIONS  1. Left ventricular ejection fraction, by estimation, is 60 to 65%. Left ventricular ejection fraction by 3D volume is 63 %. The left ventricle has normal function. The left ventricle has no regional wall motion abnormalities. Left ventricular diastolic  parameters were normal. The average left ventricular global longitudinal strain is -18.8 %. The global longitudinal strain is normal.  2. Right ventricular systolic function is normal. The right  ventricular size is mildly enlarged. There is normal pulmonary artery systolic pressure. The estimated right ventricular systolic pressure is 30.9 mmHg.  3. Left atrial size was mildly dilated.  4. The mitral valve is normal in structure. Mild to moderate mitral valve regurgitation. No evidence of mitral stenosis.  5. Tricuspid valve regurgitation is moderate.  6. The aortic valve is tricuspid. There is mild calcification of the aortic valve. Aortic valve regurgitation is mild. Aortic valve sclerosis/calcification is present, without any evidence of aortic stenosis.  7. The inferior vena cava is normal in size with greater than 50% respiratory variability, suggesting right atrial pressure of 3 mmHg. FINDINGS  Left Ventricle: Left ventricular ejection fraction, by estimation, is 60 to 65%. Left ventricular ejection  fraction by 3D volume is 63 %. The left ventricle has normal function. The left ventricle has no regional wall motion abnormalities. The average left ventricular global longitudinal strain is -18.8 %. The global longitudinal strain is normal. The left ventricular internal cavity size was normal in size. There is no left ventricular hypertrophy. Left ventricular diastolic parameters were normal. Right Ventricle: The right ventricular size is mildly enlarged. No increase in right ventricular wall thickness. Right ventricular systolic function is normal. There is normal pulmonary artery systolic pressure. The tricuspid regurgitant velocity is 2.64  m/s, and with an assumed right atrial pressure of 3 mmHg, the estimated right ventricular systolic pressure is 30.9 mmHg. Left Atrium: Left atrial size was mildly dilated. Right Atrium: Right atrial size was normal in size. Pericardium: There is no evidence of pericardial effusion. Mitral Valve: The mitral valve is normal in structure. Mild mitral annular calcification. Mild to moderate mitral valve regurgitation. No evidence of mitral valve stenosis. Tricuspid  Valve: The tricuspid valve is normal in structure. Tricuspid valve regurgitation is moderate . No evidence of tricuspid stenosis. Aortic Valve: The aortic valve is tricuspid. There is mild calcification of the aortic valve. Aortic valve regurgitation is mild. Aortic regurgitation PHT measures 562 msec. Aortic valve sclerosis/calcification is present, without any evidence of aortic stenosis. Pulmonic Valve: The pulmonic valve was not well visualized. Pulmonic valve regurgitation is not visualized. No evidence of pulmonic stenosis. Aorta: The aortic root is normal in size and structure. Venous: The inferior vena cava is normal in size with greater than 50% respiratory variability, suggesting right atrial pressure of 3 mmHg. IAS/Shunts: No atrial level shunt detected by color flow Doppler.  LEFT VENTRICLE PLAX 2D LVIDd:         3.70 cm         Diastology LVIDs:         2.50 cm         LV e' medial:    9.25 cm/s LV PW:         1.00 cm         LV E/e' medial:  11.2 LV IVS:        0.80 cm         LV e' lateral:   13.80 cm/s LVOT diam:     1.70 cm         LV E/e' lateral: 7.5 LV SV:         62 LV SV Index:   33              2D LVOT Area:     2.27 cm        Longitudinal                                Strain                                2D Strain GLS  -18.4 %                                (A2C):                                2D Strain GLS  -20.9 %                                (  A3C):                                2D Strain GLS  -17.2 %                                (A4C):                                2D Strain GLS  -18.8 %                                Avg:                                 3D Volume EF                                LV 3D EF:    Left                                             ventricul                                             ar                                             ejection                                             fraction                                             by 3D                                              volume is                                             63 %.                                 3D Volume EF:                                3D EF:  63 %                                LV EDV:       109 ml                                LV ESV:       40 ml                                LV SV:        69 ml RIGHT VENTRICLE RV Basal diam:  4.20 cm RV Mid diam:    3.40 cm RV S prime:     13.40 cm/s TAPSE (M-mode): 2.3 cm RVSP:           30.9 mmHg LEFT ATRIUM             Index        RIGHT ATRIUM           Index LA diam:        3.80 cm 2.00 cm/m   RA Pressure: 3.00 mmHg LA Vol (A2C):   50.9 ml 26.83 ml/m  RA Area:     13.20 cm LA Vol (A4C):   47.6 ml 25.09 ml/m  RA Volume:   29.10 ml  15.34 ml/m LA Biplane Vol: 52.9 ml 27.89 ml/m  AORTIC VALVE LVOT Vmax:   122.00 cm/s LVOT Vmean:  77.000 cm/s LVOT VTI:    0.273 m AI PHT:      562 msec  AORTA Ao Root diam: 2.70 cm Ao Asc diam:  3.10 cm MITRAL VALVE                  TRICUSPID VALVE MV Area (PHT):                TR Peak grad:   27.9 mmHg MV Decel Time:                TR Vmax:        264.00 cm/s MR Peak grad:    97.6 mmHg    Estimated RAP:  3.00 mmHg MR Mean grad:    75.0 mmHg    RVSP:           30.9 mmHg MR Vmax:         494.00 cm/s MR Vmean:        414.0 cm/s   SHUNTS MR PISA:         2.26 cm     Systemic VTI:  0.27 m MR PISA Eff ROA: 13 mm       Systemic Diam: 1.70 cm MR PISA Radius:  0.60 cm MV E velocity: 104.00 cm/s MV A velocity: 60.30 cm/s MV E/A ratio:  1.72 Mihai Croitoru MD Electronically signed by Thurmon Fair MD Signature Date/Time: 05/16/2023/3:09:53 PM    Final    MYOCARDIAL PERFUSION IMAGING  Result Date: 05/16/2023   The study is normal. The study is low risk.   No ST deviation was noted.   LV perfusion is normal. There is no evidence of ischemia. There is no evidence of infarction.   Left ventricular function is normal. Nuclear stress EF: 70%. The left ventricular ejection fraction is hyperdynamic (>65%). End  diastolic cavity size is normal. End systolic cavity  size is normal.   Prior study not available for comparison. Normal stress nuclear study with mild apical thinning but no ischemia or infarction.  Gated ejection fraction 70% with normal wall motion.     Assessment & Plan:   1. OSA (obstructive sleep apnea) - Split night study; Future      Obstructive Sleep Apnea (OSA) Patient has lost significant weight since previous sleep study and stopped using BiPAP approximately 16 months ago. She reports good sleep quality and no daytime sleepiness. However, patient has a history of atrial fibrillation and atrial flutter, which can be exacerbated by untreated OSA. -Order repeat NPSG with CPAP titration study to reassess presence and severity of OSA. -Discussed potential treatment options if OSA is still present, including Inspire device if patient remains intolerant to CPAP/BiPAP.  Nocturia Patient reports frequent awakenings due to need to urinate, with variable frequency. -Consider evaluation for causes of nocturia if it continues to disrupt sleep after management of OSA.  Atrial Fibrillation/Atrial Flutter Patient has a history of these conditions and is scheduled for a cardiac ablation procedure on January 2nd. Recent episodes were triggered by shingles vaccine. -Continue current cardiac management plan as directed by cardiologist.      Glenford Bayley, NP 06/05/2023

## 2023-06-07 ENCOUNTER — Ambulatory Visit
Admission: RE | Admit: 2023-06-07 | Discharge: 2023-06-07 | Disposition: A | Discharge status: Home / Self Care | Payer: Medicare Other | Source: Ambulatory Visit | Attending: Cardiology | Admitting: Cardiology

## 2023-06-07 ENCOUNTER — Other Ambulatory Visit: Payer: Self-pay

## 2023-06-07 DIAGNOSIS — I4819 Other persistent atrial fibrillation: Secondary | ICD-10-CM

## 2023-06-07 MED ORDER — IOHEXOL 350 MG/ML SOLN
100.0000 mL | Freq: Once | INTRAVENOUS | Status: AC | PRN
  Administered 2023-06-07: 100 mL via INTRAVENOUS

## 2023-06-07 MED ORDER — SODIUM CHLORIDE 0.9 % IV SOLN: INTRAVENOUS | Status: DC

## 2023-06-07 NOTE — Telephone Encounter (Signed)
Records request sent to Dr. Berenda Morale office for diabetic eye exam.

## 2023-06-08 LAB — CBC
Hematocrit: 36 % (ref 34.0–46.6)
Hemoglobin: 12.3 g/dL (ref 11.1–15.9)
MCH: 31.3 pg (ref 26.6–33.0)
MCHC: 34.2 g/dL (ref 31.5–35.7)
MCV: 92 fL (ref 79–97)
Platelets: 256 10*3/uL (ref 150–450)
RBC: 3.93 x10E6/uL (ref 3.77–5.28)
RDW: 11.1 % — ABNORMAL LOW (ref 11.7–15.4)
WBC: 5.8 10*3/uL (ref 3.4–10.8)

## 2023-06-08 LAB — BASIC METABOLIC PANEL
BUN/Creatinine Ratio: 22 (ref 12–28)
BUN: 16 mg/dL (ref 8–27)
CO2: 27 mmol/L (ref 20–29)
Calcium: 9.4 mg/dL (ref 8.7–10.3)
Chloride: 100 mmol/L (ref 96–106)
Creatinine, Ser: 0.72 mg/dL (ref 0.57–1.00)
Glucose: 117 mg/dL — ABNORMAL HIGH (ref 70–99)
Potassium: 4.4 mmol/L (ref 3.5–5.2)
Sodium: 139 mmol/L (ref 134–144)
eGFR: 89 mL/min/{1.73_m2} (ref >59)

## 2023-06-12 NOTE — Progress Notes (Unsigned)
Cardiology Clinic Note   Patient Name: ASTYN BRITTEN Date of Encounter: 06/15/2023  Primary Care Provider:  Hannah Beat, MD Primary Cardiologist:  Bryan Lemma, MD  Patient Profile    SHERMAINE HEAPS 72 year old female presents the clinic today for follow-up evaluation of her essential hypertension and paroxysmal atrial fibrillation.  Past Medical History    Past Medical History:  Diagnosis Date   B12 deficiency    Complication of anesthesia    slow to wake up    Depression    Diabetes mellitus type 2, diet-controlled (HCC) 07/24/2018   Fibromyalgia    Generalized hyperhidrosis 04/17/2014   GERD (gastroesophageal reflux disease)    History of rheumatic fever as a child    Hyperlipemia    Hypertension    denies htn on 05/09/16   HYPOTHYROIDISM 05/29/2007   Irritable bowel syndrome    Migraines    hx of    OSA (obstructive sleep apnea) 06/26/2019   Senile calcific aortic valve sclerosis    Aortic Sclerosis-no stenosis   Past Surgical History:  Procedure Laterality Date   69 HOUR PH STUDY N/A 01/04/2016   Procedure: 24 HOUR PH STUDY;  Surgeon: Ruffin Frederick, MD;  Location: [facility];  Service: Gastroenterology;  Laterality: N/A;   ABDOMINAL HYSTERECTOMY  1988   ADENOIDECTOMY     ANTERIOR CERVICAL DECOMP/DISCECTOMY FUSION  2009, 1993   first procedure in ~ 1993/Dr Botero   APPENDECTOMY  1988   ATRIAL FIBRILLATION ABLATION N/A 12/16/2021   Procedure: ATRIAL FIBRILLATION ABLATION;  Surgeon: Lanier Prude, MD;  Location: [facility];  Service: Cardiovascular;  Laterality: N/A;   BACK SURGERY     CARDIOVERSION N/A 04/03/2019   Procedure: CARDIOVERSION;  Surgeon: Lars Masson, MD;  Location: [facility]COPY;  Service: Cardiovascular;  Laterality: N/A;   CHOLECYSTECTOMY  2005   ESOPHAGEAL MANOMETRY N/A 01/04/2016   Procedure: ESOPHAGEAL MANOMETRY (EM) 24 HR PH;  Surgeon: Ruffin Frederick, MD;  Location: [facility]OSCOPY;  Service:  Gastroenterology;  Laterality: N/A;   ESOPHAGOGASTRODUODENOSCOPY (EGD) WITH PROPOFOL N/A 05/17/2016   Procedure: ESOPHAGOGASTRODUODENOSCOPY (EGD) WITH PROPOFOL;  Surgeon: Ruffin Frederick, MD;  Location: [facility] ENDOSCOPY;  Service: Gastroenterology;  Laterality: N/A;   FOOT SURGERY Left 08/2022   NASAL SINUS SURGERY  2006   SAVORY DILATION N/A 05/17/2016   Procedure: SAVORY DILATION;  Surgeon: Ruffin Frederick, MD;  Location: [facility] ENDOSCOPY;  Service: Gastroenterology;  Laterality: N/A;   TEE WITHOUT CARDIOVERSION N/A 04/03/2019   Procedure: TRANSESOPHAGEAL ECHOCARDIOGRAM (TEE)-with DCCV;  Surgeon: Lars Masson, MD;  Location: [facility] ENDOSCOPY;; EF 55 to 60%.  No LVH.  Normal RV.  Moderate LA dilation.  No thrombus.  Mild RA dilation.  Mild to moderate TR.  Mild aortic valve sclerosis, no stenosis.  Moderately elevated PA pressures (47 mmHg)   TONSILLECTOMY AND ADENOIDECTOMY     TRANSTHORACIC ECHOCARDIOGRAM  2009   Normal LV size and function. EF 60-65%. No regional wall motion and amount is. Mild aortic sclerosis, no stenosis   TUBAL LIGATION  1979    Allergies  Allergies  Allergen Reactions   Cyclobenzaprine Hcl Anaphylaxis and Swelling    Reaction - throat closed, tongue swelling   Rizatriptan Benzoate Anaphylaxis    Throat and Tongue Swelling Closed   Sumatriptan Swelling    Throat closed & Tongue Swelling   Tizanidine Shortness Of Breath   Aleve [Naproxen Sodium] Hives   Aspartame Other (See Comments)   Clarithromycin Hives  Corn-Containing Products Other (See Comments)    Head congestion   Doxycycline Diarrhea and Nausea And Vomiting   Duloxetine Nausea Only   Egg [Egg-Derived Products] Diarrhea and Nausea And Vomiting   Keflex [Cephalexin] Itching and Other (See Comments)    Sores   Metronidazole Diarrhea and Nausea Only   Monosodium Glutamate Diarrhea and Other (See Comments)   Oxycodone Hcl Diarrhea and Nausea And Vomiting   Oxycontin [Oxycodone Hcl] Nausea  And Vomiting   Penicillins Other (See Comments)    Unknown reaction Did it involve swelling of the face/tongue/throat, SOB, or low BP? Unknown Did it involve sudden or severe rash/hives, skin peeling, or any reaction on the inside of your mouth or nose? Unknown Did you need to seek medical attention at a hospital or doctor's office? Unknown When did it last happen?   infant   If all above answers are "NO", may proceed with cephalosporin use.   Rosuvastatin Other (See Comments)    Muscle aches   Venlafaxine Hives   Voltaren [Diclofenac Sodium] Hives    Reaction to oral med, uses gel occasionally   Septra [Sulfamethoxazole-Trimethoprim] Itching and Rash   Tramadol Diarrhea, Nausea And Vomiting and Palpitations    History of Present Illness    SHADOE NEWGENT has a PMH of essential hypertension, paroxysmal atrial fibrillation CHA2DS2-VASc score 4 on Eliquis, OSA, GERD, hypothyroidism, type 2 diabetes, fibromyalgia, HLD, depression, obesity, vitamin B12 deficiency, vitamin D deficiency, obesity hypoventilation syndrome, and chronic right sciatic pain.  She was seen by Dr. Herbie Baltimore 07/21/2021.  During that time she continued to do well.  She was in the process of OSA evaluation.  She reported 1 short episode of atrial fibrillation in the setting of a GI illness.  The episode resolved as her GI symptoms resolved.  A review of pill in the pocket flecainide treatment for breakthrough A-fib was reviewed.  Her blood pressure was 128/72 with a pulse of 64.  Follow-up was planned for 10 to 11 months.  She was seen in follow-up by Dr. Lalla Brothers on 03/16/2022.  During that time she continued to do well status post A-fib ablation had been completed on 12/16/2021.  She denied recurrent episodes of atrial fibrillation.  Her Eliquis was continued.  Follow-up was planned for 6 months.  She presented  to the clinic 07/20/22 for follow-up evaluation and stated she felt  well today.  She reported that she had been  walking most days of the week.  She denied episodes of palpitations but did note that her heart rate was somewhat higher in the morning when she woke up.  After she took her Cardizem medicine her heart rate would decrease.  Her blood pressure continued to be well-controlled at 124/60.  She reported that her husband passed away in June 19, 2023.  She had a hammertoe on her left foot and callus on the bottom of her foot which was causing her some pain.  She reported a failed bunion surgery and was hoping to have the foot operated on.  I have asked her to have the surgeon send [place] a formal request so that we may review it prior to the surgery.  She expressed understanding.  I  planned follow-up in 12 months, had her maintain her physical activity if not increase, continue to follow heart healthy low-sodium high-fiber diet, and continue her current medication regimen.  She was seen in the emergency department on 05/09/2023 and discharged on 05/11/2023.  Cardiology was consulted.  She reported having second  episode of atrial fibrillation in the last 3 months.  Her last episode was shortly after her first shingles vaccination and then after her booster of same shot.  Her heart rate was noted to be in the 150s.  She received IV Lopressor and her heart rate decreased to 110-120 and she continued to be in atrial fibrillation.  She denied missed doses of her diltiazem and apixaban.  She was seen in follow-up by Dr. Lalla Brothers on 05/29/2023.  She was continued on diltiazem 40 mg daily, Eliquis, flecainide 100 mg twice daily.  Her November 2024 echocardiogram was reviewed which showed EF of 60 to 65%, normal RV function, mildly dilated left atria, mild-moderate MR, and mild AI.  She also had stress testing 11/24 which showed low risk and no ischemia.  Atrial fibrillation ablation was discussed.Marland Kitchen  She wished to proceed.  A-fib ablation is planned.  Her blood pressure was well-controlled.  She presents to the clinic today for  follow-up evaluation and states she has been doing fairly well.  We reviewed her recent hospitalization and her shingles vaccinations.  She has rarely been using her extra diltiazem and is staying active caring for her [place] terrier's.  She plans to have her cholesterol drawn in April.  We reviewed her upcoming ablation which is planned for January 2.  She continues to note occasional irregular heartbeats but denies sustained heartbeats.  She continues to follow a low-salt diet.  Her blood pressure is well-controlled today.  Will plan follow-up in 6 to 9 months..  Today she denies chest pain, shortness of breath, lower extremity edema, fatigue, palpitations, melena, hematuria, hemoptysis, diaphoresis, weakness, presyncope, syncope, orthopnea, and PND.    Home Medications    Prior to Admission medications   Medication Sig Start Date End Date Taking? Authorizing Provider  acetaminophen (TYLENOL) 650 MG CR tablet Take 650 mg by mouth in the morning and at bedtime.    [provider]  apixaban (ELIQUIS) 5 MG TABS tablet Take 1 tablet (5 mg total) by mouth 2 (two) times daily. 06/17/22   Marykay Lex, MD  cetirizine (ZYRTEC) 10 MG tablet Take 10 mg by mouth at bedtime.    [provider]  Cholecalciferol (VITAMIN D) 125 MCG (5000 UT) CAPS Take 5,000 Units by mouth every morning.     [provider]  clindamycin (CLEOCIN T) 1 % lotion Apply 1 application topically daily as needed (eczema).  06/07/19   [provider]  Coenzyme Q-10 100 MG capsule Take 100 mg by mouth every morning.     [provider]  diclofenac sodium (VOLTAREN) 1 % GEL Apply 4 g topically 4 (four) times daily as needed (joint pain).     [provider]  diltiazem (CARDIZEM CD) 240 MG 24 hr capsule TAKE 1 CAPSULE BY MOUTH EVERY DAY 12/06/21   Marykay Lex, MD  diltiazem (CARDIZEM) 30 MG tablet Take one tablet by mouth every 4 hours as needed for heart rate greater than  100, top number blood pressure needs to be above 100 04/14/21   Fenton, Clint R, PA  docusate sodium (COLACE) 250 MG capsule Take 250 mg by mouth at bedtime. 10/22/21   [provider]  fluocinonide (LIDEX) 0.05 % external solution Apply 1 application topically daily as needed (eczema on scalp).  06/07/19   [provider]  gabapentin (NEURONTIN) 300 MG capsule TAKE 1 CAPSULE BY MOUTH 3  TIMES DAILY 03/14/22   Kirsteins, Victorino Sparrow, MD  hydroxypropyl methylcellulose /  hypromellose (ISOPTO TEARS / GONIOVISC) 2.5 % ophthalmic solution Place 1-2 drops into both eyes 3 (three) times daily as needed (dry/irritated eyes.).    [provider]  levothyroxine (SYNTHROID) 50 MCG tablet TAKE 1 TABLET BY MOUTH  DAILY 09/27/21   Copland, Karleen Hampshire, MD  loratadine (CLARITIN) 10 MG tablet Take 10 mg by mouth every morning.     [provider]  Magnesium Bisglycinate (MAG GLYCINATE PO) Take 400 mg by mouth at bedtime.    [provider]  methocarbamol (ROBAXIN) 500 MG tablet TAKE 1 TABLET BY MOUTH EVERY 8 HOURS AS NEEDED FOR MUSCLE SPASM 01/06/22   Jones Bales, NP  morphine (MS CONTIN) 15 MG 12 hr tablet Take 1 tablet (15 mg total) by mouth 3 (three) times daily. 07/06/22   Jones Bales, NP  oxybutynin (DITROPAN-XL) 10 MG 24 hr tablet TAKE 1 TABLET BY MOUTH AT  BEDTIME 09/27/21   Copland, Karleen Hampshire, MD  oxymetazoline (AFRIN) 0.05 % nasal spray Place 1 spray into both nostrils 2 (two) times daily as needed for congestion.    [provider]  RABEprazole (ACIPHEX) 20 MG tablet Take 1 tablet (20 mg total) by mouth every evening. 01/02/22   Copland, Karleen Hampshire, MD  Resveratrol 100 MG CAPS Take 100 mg by mouth in the morning and at bedtime.    [provider]  tretinoin (RETIN-A) 0.05 % cream Apply 1 application  topically daily as needed (acne). 06/07/19   [provider]  Wheat Dextrin (BENEFIBER) CHEW Chew 2 tablets by mouth 2 (two) times daily as needed  (regularity).    [provider]    Family History    Family History  Problem Relation Age of Onset   Breast cancer Mother    Diabetes Mother    Heart disease Mother    Allergies Mother    Diabetes Father    Heart disease Father    Emphysema Father    Allergies Father    Breast cancer Paternal Aunt    Colon cancer Neg Hx    Esophageal cancer Neg Hx    Rectal cancer Neg Hx    Stomach cancer Neg Hx    She indicated that her mother is deceased. She indicated that her father is deceased. She indicated that the status of her maternal uncle is unknown. She indicated that the status of her paternal aunt is unknown. She indicated that the status of her neg hx is unknown.  Social History    Social History   Socioeconomic History   Marital status: Widowed    Spouse name: Not on file   Number of children: Not on file   Years of education: Not on file   Highest education level: Not on file  Occupational History   Occupation: Catering manager   Occupation: retired  Tobacco Use   Smoking status: Never    Passive exposure: Past   Smokeless tobacco: Never   Tobacco comments:    Never smoke 01/13/22  Vaping Use   Vaping status: Never Used  Substance and Sexual Activity   Alcohol use: Yes    Alcohol/week: 1.0 standard drink of alcohol    Types: 1 Glasses of wine per week    Comment: rare   Drug use: Never   Sexual activity: Not Currently  Other Topics Concern   Not on file  Social History Narrative   Not on file   Social Drivers of Health   Financial Resource Strain: Low Risk  (01/19/2023)  Overall Financial Resource Strain (CARDIA)    Difficulty of Paying Living Expenses: Not hard at all  Food Insecurity: No Food Insecurity (01/19/2023)   Hunger Vital Sign    Worried About Running Out of Food in the Last Year: Never true    Ran Out of Food in the Last Year: Never true  Transportation Needs: No Transportation Needs (01/19/2023)   PRAPARE - Scientist, research (physical sciences) (Medical): No    Lack of Transportation (Non-Medical): No  Physical Activity: Insufficiently Active (01/19/2023)   Exercise Vital Sign    Days of Exercise per Week: 7 days    Minutes of Exercise per Session: 20 min  Stress: No Stress Concern Present (01/19/2023)   [employer] of Occupational Health - Occupational Stress Questionnaire    Feeling of Stress : Not at all  Social Connections: Moderately Integrated (01/19/2023)   Social Connection and Isolation Panel [NHANES]    Frequency of Communication with Friends and Family: More than three times a week    Frequency of Social Gatherings with Friends and Family: More than three times a week    Attends Religious Services: More than 4 times per year    Active Member of [employer] or Organizations: Yes    Attends Banker Meetings: More than 4 times per year    Marital Status: Widowed  Intimate Partner Violence: Not At Risk (01/19/2023)   Humiliation, Afraid, Rape, and Kick questionnaire    Fear of Current or Ex-Partner: No    Emotionally Abused: No    Physically Abused: No    Sexually Abused: No     Review of Systems    General:  No chills, fever, night sweats or weight changes.  Cardiovascular:  No chest pain, dyspnea on exertion, edema, orthopnea, palpitations, paroxysmal nocturnal dyspnea. Dermatological: No rash, lesions/masses Respiratory: No cough, dyspnea Urologic: No hematuria, dysuria Abdominal:   No nausea, vomiting, diarrhea, bright red blood per rectum, melena, or hematemesis Neurologic:  No visual changes, wkns, changes in mental status. All other systems reviewed and are otherwise negative except as noted above.  Physical Exam    VS:  BP 134/74 (BP Location: Left Arm, Patient Position: Sitting, Cuff Size: Normal)   Pulse 77   Ht 5\' 4"  (1.626 m)   Wt 185 lb 9.6 oz (84.2 kg)   SpO2 96%   BMI 31.86 kg/m  , BMI Body mass index is 31.86 kg/m. GEN: Well nourished, well developed, in no acute  distress. HEENT: normal. Neck: Supple, no JVD, carotid bruits, or masses. Cardiac: RRR, 3/6 systolic murmur heard along right sternal border, rubs, or gallops. No clubbing, cyanosis, generalized ankle nonpitting edema left greater than right.  Radials/DP/PT 2+ and equal bilaterally.  Respiratory:  Respirations regular and unlabored, clear to auscultation bilaterally. GI: Soft, nontender, nondistended, BS + x 4. MS: no deformity or atrophy. Skin: warm and dry, no rash. Neuro:  Strength and sensation are intact. Psych: Normal affect.  Accessory Clinical Findings    Recent Labs: 10/03/2022: ALT 18 03/15/2023: TSH 2.260 05/09/2023: Magnesium 1.9 06/07/2023: BUN 16; Creatinine, Ser 0.72; Hemoglobin 12.3; Platelets 256; Potassium 4.4; Sodium 139   Recent Lipid Panel    Component Value Date/Time   CHOL 161 10/03/2022 0844   TRIG 101.0 10/03/2022 0844   HDL 59.20 10/03/2022 0844   CHOLHDL 3 10/03/2022 0844   VLDL 20.2 10/03/2022 0844   LDLCALC 82 10/03/2022 0844   LDLDIRECT 132.0 08/23/2021 1129  ECG personally reviewed by me today-none today.  TEE 04/03/2019 IMPRESSIONS     1. Left ventricular ejection fraction, by visual estimation, is 55 to  60%. The left ventricle has normal function. Normal left ventricular size.  There is no left ventricular hypertrophy.   2. Global right ventricle has normal systolic function.The right  ventricular size is normal. No increase in right ventricular wall  thickness.   3. Left atrial size was moderately dilated.   4. No thrombus is seen in the left atrium or left atrial appendage.   5. Right atrial size was mildly dilated.   6. The mitral valve is normal in structure. Mild mitral valve  regurgitation. No evidence of mitral stenosis.   7. The tricuspid valve is normal in structure. Tricuspid valve  regurgitation mild-moderate.   8. The aortic valve is normal in structure. Aortic valve regurgitation is  mild by color flow Doppler.  Mild aortic valve sclerosis without stenosis.   9. The pulmonic valve was normal in structure. Pulmonic valve  regurgitation is not visualized by color flow Doppler.  10. Moderately elevated pulmonary artery systolic pressure.  11. The tricuspid regurgitant velocity is 3.12 m/s, and with an assumed  right atrial pressure of 8 mmHg, the estimated right ventricular systolic  pressure is moderately elevated at 46.9 mmHg.  12. The inferior vena cava is normal in size with greater than 50%  respiratory variability, suggesting right atrial pressure of 3 mmHg.  13. No thrombus is seen in the left atrium or left atrial appendage.  14. This TEE was followed by a successful cardioversion.   FINDINGS   Left Ventricle: Left ventricular ejection fraction, by visual estimation,  is 55 to 60%. The left ventricle has normal function. There is no left  ventricular hypertrophy. Normal left ventricular size.   Right Ventricle: The right ventricular size is normal. No increase in  right ventricular wall thickness. Global RV systolic function is has  normal systolic function. The tricuspid regurgitant velocity is 3.12 m/s,  and with an assumed right atrial pressure   of 8 mmHg, the estimated right ventricular systolic pressure is  moderately elevated at 46.9 mmHg.   Left Atrium: Left atrial size was moderately dilated. No thrombus is seen  in the left atrium or left atrial appendage.   Right Atrium: Right atrial size was mildly dilated   Pericardium: There is no evidence of pericardial effusion.   Mitral Valve: The mitral valve is normal in structure. No evidence of  mitral valve stenosis by observation. Mild mitral valve regurgitation.   Tricuspid Valve: The tricuspid valve is normal in structure. Tricuspid  valve regurgitation mild-moderate by color flow Doppler.   Aortic Valve: The aortic valve is normal in structure. Aortic valve  regurgitation is mild by color flow Doppler. Mild aortic valve  sclerosis  is present, with no evidence of aortic valve stenosis.   Pulmonic Valve: The pulmonic valve was normal in structure. Pulmonic valve  regurgitation is not visualized by color flow Doppler.   Aorta: The aortic root, ascending aorta and aortic arch are all  structurally normal, with no evidence of dilitation or obstruction.   Venous: The inferior vena cava is normal in size with greater than 50%  respiratory variability, suggesting right atrial pressure of 3 mmHg.   Shunts: No ventricular septal defect is seen or detected. There is no  evidence of an atrial septal defect. No atrial level shunt detected by  color flow Doppler.   Additional Comments: No  thrombus is seen in the left atrium or left atrial  appendage. This TEE was followed by a successful cardioversion.    TRICUSPID VALVE             Normals  TR Peak grad:   38.9 mmHg  TR Vmax:        312.00 cm/s 288 cm/s     Tobias Alexander MD  Electronically signed by Tobias Alexander MD  Signature Date/Time: 04/03/2019/12:37:47 PM      Exercise tolerance test 09/25/2019  There was no ST segment deviation noted during stress.   Exercise time: 3 min 37 seconds Workload: 5.3 METS Test stopped due to: SOB BP response: Elevated without hypertensive response HR response: 88% maximal heart rate Rhythm: sinus rhythm, one PAC in recovery   Patient on flecanide. No change in QRS duration with exercise.  No ischemia on stress ECG at low workload.    Assessment & Plan   1.Essential hypertension-BP today 134/74 Continue current medical therapy. Heart healthy low-sodium diet-salty 6 reviewed I maintain physical activity    Paroxysmal atrial fibrillation-working with Dr. Lalla Brothers and is planning A-fib ablation.  Continues to report compliance with her apixaban and denies bleeding issues. Continue apixaban, diltiazem Heart healthy low-sodium diet Increase physical activity as tolerated Avoid triggers caffeine, chocolate,  EtOH, dehydration etc.  Cardiac murmur-3/6 systolic murmur heard along right sternal border.  Reviewed tricuspid regurgitation and mitral valve regurgitation which is evident on echocardiogram 10/20. Discussed plan for repeating echocardiogram in 2025 or if clinically indicated.  Hyperlipidemia- 10/03/2022: Cholesterol 161; HDL 59.20; LDL Cholesterol 82; Triglycerides 101.0; VLDL 20.2 Benefiber, co-Q10 Heart healthy low-sodium high-fiber diet Increase physical activity as tolerated  Obesity-weight today 185.6 lbs.  Remains fairly physically active. Increase physical activity as tolerated Continue weight loss  Disposition: Follow-up with Dr. Herbie Baltimore in 6-9 months.   Thomasene Ripple. Eutha Cude NP-C     06/15/2023, 3:32 PM [facility] [address] Office [phone] Fax [phone]    I spent 13 minutes examining this patient, reviewing medications, and using patient centered shared decision making involving her cardiac care.  Prior to her visit I spent greater than 20 minutes reviewing her past medical history,  medications, and prior cardiac tests.

## 2023-06-15 ENCOUNTER — Encounter: Payer: Self-pay | Admitting: General Practice

## 2023-06-15 ENCOUNTER — Ambulatory Visit: Payer: Medicare Other | Attending: General Practice | Admitting: General Practice

## 2023-06-15 VITALS — BP 134/74 | HR 77 | Ht 64.0 in | Wt 185.6 lb

## 2023-06-15 DIAGNOSIS — I480 Paroxysmal atrial fibrillation: Secondary | ICD-10-CM

## 2023-06-15 DIAGNOSIS — R011 Cardiac murmur, unspecified: Secondary | ICD-10-CM

## 2023-06-15 DIAGNOSIS — E785 Hyperlipidemia, unspecified: Secondary | ICD-10-CM

## 2023-06-15 DIAGNOSIS — I10 Essential (primary) hypertension: Secondary | ICD-10-CM | POA: Diagnosis not present

## 2023-06-15 DIAGNOSIS — E669 Obesity, unspecified: Secondary | ICD-10-CM

## 2023-06-15 NOTE — Patient Instructions (Signed)
Medication Instructions:  The current medical regimen is effective;  continue present plan and medications as directed. Please refer to the Current Medication list given to you today.  *If you need a refill on your cardiac medications before your next appointment, please call your pharmacy*  Lab Work: HAVE YOUR PRIMARY MD RUN FASTING LIPIDS IN APRIL 2025  Other Instructions MAINTAIN PHYSICAL ACTIVITY  Follow-Up: At [facility], you and your health needs are our priority.  As part of our continuing mission to provide you with exceptional heart care, we have created designated Provider Care Teams.  These Care Teams include your primary Cardiologist (physician) and Advanced Practice Providers (APPs -  Physician Assistants and Nurse Practitioners) who all work together to provide you with the care you need, when you need it.  Your next appointment:   6-9 month(s)  Provider:   Bryan Lemma, MD

## 2023-06-27 ENCOUNTER — Encounter: Payer: Self-pay | Admitting: Cardiology

## 2023-06-27 NOTE — Pre-Procedure Instructions (Signed)
 Instructed patient on the following items: Arrival time 0515 Nothing to eat or drink after midnight No meds AM of procedure Responsible person to drive you home and stay with you for 24 hrs  Have you missed any doses of anti-coagulant Eliquis - takes twice a day, hasn't missed any doses.  Don't take dose on Thursday morning.

## 2023-06-29 ENCOUNTER — Ambulatory Visit (HOSPITAL_COMMUNITY): Payer: Medicare Other | Admitting: Anesthesiology

## 2023-06-29 ENCOUNTER — Ambulatory Visit (HOSPITAL_COMMUNITY)
Admission: RE | Admit: 2023-06-29 | Discharge: 2023-06-29 | Disposition: A | Discharge status: Home / Self Care | Payer: Medicare Other | Attending: Cardiology | Admitting: Cardiology

## 2023-06-29 ENCOUNTER — Ambulatory Visit (HOSPITAL_BASED_OUTPATIENT_CLINIC_OR_DEPARTMENT_OTHER): Payer: Medicare Other | Admitting: Anesthesiology

## 2023-06-29 ENCOUNTER — Other Ambulatory Visit: Payer: Self-pay

## 2023-06-29 ENCOUNTER — Ambulatory Visit (HOSPITAL_COMMUNITY)
Admission: RE | Disposition: A | Discharge status: Home / Self Care | Payer: Self-pay | Source: Home / Self Care | Attending: Cardiology

## 2023-06-29 DIAGNOSIS — I4891 Unspecified atrial fibrillation: Secondary | ICD-10-CM | POA: Diagnosis not present

## 2023-06-29 DIAGNOSIS — I483 Typical atrial flutter: Secondary | ICD-10-CM | POA: Insufficient documentation

## 2023-06-29 DIAGNOSIS — I4819 Other persistent atrial fibrillation: Secondary | ICD-10-CM | POA: Insufficient documentation

## 2023-06-29 DIAGNOSIS — Z7901 Long term (current) use of anticoagulants: Secondary | ICD-10-CM | POA: Insufficient documentation

## 2023-06-29 DIAGNOSIS — Z79899 Other long term (current) drug therapy: Secondary | ICD-10-CM | POA: Insufficient documentation

## 2023-06-29 DIAGNOSIS — I10 Essential (primary) hypertension: Secondary | ICD-10-CM | POA: Insufficient documentation

## 2023-06-29 HISTORY — PX: ATRIAL FIBRILLATION ABLATION: EP1191

## 2023-06-29 LAB — GLUCOSE, CAPILLARY
Glucose-Capillary: 147 mg/dL — ABNORMAL HIGH (ref 70–99)
Glucose-Capillary: 123 mg/dL — ABNORMAL HIGH (ref 70–99)

## 2023-06-29 SURGERY — ATRIAL FIBRILLATION ABLATION
Anesthesia: General

## 2023-06-29 MED ORDER — SODIUM CHLORIDE 0.9 % IV SOLN: INTRAVENOUS | Status: DC

## 2023-06-29 MED ORDER — ATROPINE SULFATE 1 MG/10ML IJ SOSY
PREFILLED_SYRINGE | INTRAMUSCULAR | Status: AC
  Filled 2023-06-29: qty 10

## 2023-06-29 MED ORDER — DEXAMETHASONE SODIUM PHOSPHATE 10 MG/ML IJ SOLN
INTRAMUSCULAR | Status: DC | PRN
  Administered 2023-06-29: 5 mg via INTRAVENOUS

## 2023-06-29 MED ORDER — SODIUM CHLORIDE 0.9% FLUSH: 3.0000 mL | INTRAVENOUS | Status: DC | PRN

## 2023-06-29 MED ORDER — HEPARIN SODIUM (PORCINE) 1000 UNIT/ML IJ SOLN
INTRAMUSCULAR | Status: DC | PRN
  Administered 2023-06-29: 1000 [IU] via INTRAVENOUS

## 2023-06-29 MED ORDER — SUGAMMADEX SODIUM 200 MG/2ML IV SOLN
INTRAVENOUS | Status: DC | PRN
  Administered 2023-06-29: 400 mg via INTRAVENOUS

## 2023-06-29 MED ORDER — HEPARIN (PORCINE) IN NACL 1000-0.9 UT/500ML-% IV SOLN
INTRAVENOUS | Status: DC | PRN
  Administered 2023-06-29 (×3): 500 mL

## 2023-06-29 MED ORDER — HEPARIN SODIUM (PORCINE) 1000 UNIT/ML IJ SOLN
INTRAMUSCULAR | Status: AC
  Filled 2023-06-29: qty 10

## 2023-06-29 MED ORDER — ROCURONIUM BROMIDE 10 MG/ML (PF) SYRINGE
PREFILLED_SYRINGE | INTRAVENOUS | Status: DC | PRN
  Administered 2023-06-29: 20 mg via INTRAVENOUS
  Administered 2023-06-29: 50 mg via INTRAVENOUS
  Administered 2023-06-29: 20 mg via INTRAVENOUS

## 2023-06-29 MED ORDER — ONDANSETRON HCL 4 MG/2ML IJ SOLN
4.0000 mg | Freq: Four times a day (QID) | INTRAMUSCULAR | Status: DC | PRN
Start: 2023-06-29 — End: 2023-06-29

## 2023-06-29 MED ORDER — PHENYLEPHRINE 80 MCG/ML (10ML) SYRINGE FOR IV PUSH (FOR BLOOD PRESSURE SUPPORT)
PREFILLED_SYRINGE | INTRAVENOUS | Status: DC | PRN
  Administered 2023-06-29 (×2): 80 ug via INTRAVENOUS

## 2023-06-29 MED ORDER — LIDOCAINE 2% (20 MG/ML) 5 ML SYRINGE
INTRAMUSCULAR | Status: DC | PRN
  Administered 2023-06-29: 60 mg via INTRAVENOUS

## 2023-06-29 MED ORDER — ACETAMINOPHEN 325 MG PO TABS
650.0000 mg | ORAL_TABLET | ORAL | Status: DC | PRN
Start: 2023-06-29 — End: 2023-06-29

## 2023-06-29 MED ORDER — PROTAMINE SULFATE 10 MG/ML IV SOLN
INTRAVENOUS | Status: DC | PRN
  Administered 2023-06-29: 30 mg via INTRAVENOUS

## 2023-06-29 MED ORDER — APIXABAN 5 MG PO TABS
5.0000 mg | ORAL_TABLET | Freq: Two times a day (BID) | ORAL | Status: DC
  Administered 2023-06-29: 5 mg via ORAL
  Filled 2023-06-29: qty 1

## 2023-06-29 MED ORDER — ATROPINE SULFATE 1 MG/ML IV SOLN
INTRAVENOUS | Status: DC | PRN
  Administered 2023-06-29: 1 mg via INTRAVENOUS

## 2023-06-29 MED ORDER — FENTANYL CITRATE (PF) 250 MCG/5ML IJ SOLN
INTRAMUSCULAR | Status: DC | PRN
  Administered 2023-06-29: 100 ug via INTRAVENOUS

## 2023-06-29 MED ORDER — ONDANSETRON HCL 4 MG/2ML IJ SOLN
INTRAMUSCULAR | Status: DC | PRN
  Administered 2023-06-29: 4 mg via INTRAVENOUS

## 2023-06-29 MED ORDER — SODIUM CHLORIDE 0.9% FLUSH: 3.0000 mL | Freq: Two times a day (BID) | INTRAVENOUS | Status: DC

## 2023-06-29 MED ORDER — HEPARIN SODIUM (PORCINE) 1000 UNIT/ML IJ SOLN
INTRAMUSCULAR | Status: DC | PRN
  Administered 2023-06-29: 13000 [IU] via INTRAVENOUS

## 2023-06-29 MED ORDER — SODIUM CHLORIDE 0.9 % IV SOLN: 250.0000 mL | INTRAVENOUS | Status: DC | PRN

## 2023-06-29 MED ORDER — PROPOFOL 10 MG/ML IV BOLUS
INTRAVENOUS | Status: DC | PRN
  Administered 2023-06-29: 130 mg via INTRAVENOUS

## 2023-06-29 MED ORDER — FENTANYL CITRATE (PF) 100 MCG/2ML IJ SOLN
INTRAMUSCULAR | Status: AC
  Filled 2023-06-29: qty 2

## 2023-06-29 SURGICAL SUPPLY — 22 items
BAG SNAP BAND KOVER 36X36 (MISCELLANEOUS) IMPLANT
BLANKET WARM UNDERBOD FULL ACC (MISCELLANEOUS) ×1 IMPLANT
CABLE PFA RX CATH CONN (CABLE) IMPLANT
CATH ABLAT QDOT MICRO BI TC FJ (CATHETERS) IMPLANT
CATH FARAWAVE ABLATION 31 (CATHETERS) IMPLANT
CATH OCTARAY 2.0 F 3-3-3-3-3 (CATHETERS) IMPLANT
CATH SOUNDSTAR ECO 8FR (CATHETERS) IMPLANT
CATH WEB BI DIR CSDF CRV REPRO (CATHETERS) IMPLANT
CLOSURE PERCLOSE PROSTYLE (VASCULAR PRODUCTS) IMPLANT
COVER SWIFTLINK CONNECTOR (BAG) ×1 IMPLANT
DILATOR VESSEL 38 20CM 16FR (INTRODUCER) IMPLANT
GUIDEWIRE INQWIRE 1.5J.035X260 (WIRE) IMPLANT
INQWIRE 1.5J .035X260CM (WIRE) ×1
KIT VERSACROSS CNCT FARADRIVE (KITS) IMPLANT
PACK EP LF (CUSTOM PROCEDURE TRAY) ×1 IMPLANT
PAD DEFIB RADIO PHYSIO CONN (PAD) ×1 IMPLANT
PATCH CARTO3 (PAD) IMPLANT
SHEATH FARADRIVE STEERABLE (SHEATH) IMPLANT
SHEATH PINNACLE 8F 10CM (SHEATH) IMPLANT
SHEATH PINNACLE 9F 10CM (SHEATH) IMPLANT
SHEATH PROBE COVER 6X72 (BAG) IMPLANT
TUBING SMART ABLATE COOLFLOW (TUBING) IMPLANT

## 2023-06-29 NOTE — Anesthesia Postprocedure Evaluation (Signed)
 Anesthesia Post Note  Patient: [name]  Procedure(s) Performed: ATRIAL FIBRILLATION ABLATION     Patient location during evaluation: PACU Anesthesia Type: General Level of consciousness: awake and alert Pain management: pain level controlled Vital Signs Assessment: post-procedure vital signs reviewed and stable Respiratory status: spontaneous breathing, nonlabored ventilation, respiratory function stable and patient connected to nasal cannula oxygen  Cardiovascular status: blood pressure returned to baseline and stable Postop Assessment: no apparent nausea or vomiting Anesthetic complications: no   There were no known notable events for this encounter.  Last Vitals:  Vitals:   06/29/23 1045 06/29/23 1100  BP: (!) 141/62 119/62  Pulse: 80 88  Resp: 11 16  Temp:    SpO2: 96% 96%    Last Pain:  Vitals:   06/29/23 1022  TempSrc:   PainSc: 0-No pain                 Lynwood MARLA Cornea

## 2023-06-29 NOTE — Transfer of Care (Signed)
 Immediate Anesthesia Transfer of Care Note  Patient: [name]  Procedure(s) Performed: ATRIAL FIBRILLATION ABLATION  Patient Location: PACU  Anesthesia Type:General  Level of Consciousness: awake, alert , and oriented  Airway & Oxygen  Therapy: Patient Spontanous Breathing and Patient connected to nasal cannula oxygen   Post-op Assessment: Report given to RN and Post -op Vital signs reviewed and stable  Post vital signs: Reviewed and stable  Last Vitals:  Vitals Value Taken Time  BP    Temp    Pulse 87 06/29/23 0934  Resp 8 06/29/23 0934  SpO2 97 % 06/29/23 0934  Vitals shown include unfiled device data.  Last Pain:  Vitals:   06/29/23 0557  TempSrc: Oral  PainSc: 0-No pain         Complications: There were no known notable events for this encounter.

## 2023-06-29 NOTE — H&P (Signed)
 Electrophysiology Office Follow up Visit Note:     Date:  06/29/2023    ID:  [name], DOB [birth year], MRN [id number]   PCP:  Watt Mirza, MD  [facility] Cardiologist:  Alm Clay, MD  [facility] Electrophysiologist:  OLE ONEIDA HOLTS, MD      Interval History:       [name] is a 73 y.o. female who presents for a follow up visit.    She has a history of atrial fibrillation and underwent catheter ablation December 16, 2021.  During that procedure the veins and posterior wall were isolated. She saw Elvie in clinic on April 12, 2023.  That appointment occurred shortly after an episode of symptomatic atrial fibrillation.  For that episode she took a flecainide  she had at home.  At the appointment with Elvie she reported almost daily episodes of symptomatic palpitations in the morning.  She has been taking diltiazem  for this.  She is on Eliquis  for stroke prophylaxis.   She is currently taking diltiazem  to 40 mg by mouth once daily, Eliquis , flecainide  100 mg by mouth twice daily.   Discussed the use of AI scribe software for clinical note transcription with the patient, who gave verbal consent to proceed.   History of Present Illness   The patient, with a history of atrial fibrillation and ablation, presents with recurrent episodes of high heart rate. She associates these episodes with receiving the shingles vaccine, as the first episode occurred after the first dose and the second episode, which required cardioversion at the hospital, occurred after the second dose. Since the hospital visit, the patient reports feeling generally well, with heart rates occasionally reaching around 120 bpm during activity but returning to 80-90 bpm at rest. She is currently on Eliquis  twice daily and Cardizem  240mg  daily. She has had a stress test and echo, both of which looked good. She also reports occasional lightheadedness, particularly when bending over and standing up.        Presents for AF/AFL ablation. Procedure reviewed.       Objective Past medical, surgical, social and family history were reviewed.   ROS:   Please see the history of present illness.    All other systems reviewed and are negative.   EKGs/Labs/Other Studies Reviewed:     The following studies were reviewed today:   May 16, 2023 echo EF 60 to 65% RV function normal Mildly dilated left atrium Mild to moderate MR Mild AI   May 16, 2023 SPECT Low risk study without ischemia   May 09, 2023 EKG shows typical appearing atrial flutter         Physical Exam:     VS:  BP 130/53   Pulse 68   Ht 5' 4.5 (1.638 m)   Wt 185 lb (83.9 kg)   SpO2 96%   BMI 31.26 kg/m         Wt Readings from Last 3 Encounters:  05/29/23 185 lb (83.9 kg)  05/16/23 186 lb (84.4 kg)  05/03/23 186 lb (84.4 kg)      Physical Exam   GENERAL: Well appearing, no distress, positioned in bed at 90 degrees. CHEST: Lungs clear to auscultation. CARDIOVASCULAR: Regular rate and rhythm, no increased work of breathing.         Assessment ASSESSMENT:     1. Persistent atrial fibrillation (HCC)   2. Typical atrial flutter (HCC)   3. Primary hypertension     PLAN:  In order of problems listed above:   Assessment and Plan   #Persistent atrial fibrillation and flutter Now with recurrence.  I discussed treatment options for her recurrent arrhythmia including antiarrhythmic drugs and catheter ablation.   Discussed treatment options today for AF including antiarrhythmic drug therapy and ablation. Discussed risks, recovery and likelihood of success with each treatment strategy. Risk, benefits, and alternatives to EP study and ablation for afib were discussed. These risks include but are not limited to stroke, bleeding, vascular damage, tamponade, perforation, damage to the esophagus, lungs, phrenic nerve and other structures, pulmonary vein stenosis, worsening renal function, coronary  vasospasm and death.  Discussed potential need for repeat ablation procedures and antiarrhythmic drugs after an initial ablation. The patient understands these risk and wishes to proceed.  We will therefore proceed with catheter ablation at the next available time.  Carto, ICE, anesthesia are requested for the procedure.  Will also obtain CT PV protocol prior to the procedure to exclude LAA thrombus and further evaluate atrial anatomy.   Ablation strategy would be repeat PVI and posterior wall plus CTI.     #Hypertension At goal today.  Recommend checking blood pressures 1-2 times per week at home and recording the values.  Recommend bringing these recordings to the primary care physician.      Presents for AF/AFL ablation. Procedure reviewed.       Signed, Ole Holts, MD, [facility], [facility] 06/29/2023 Electrophysiology [facility]

## 2023-06-29 NOTE — Anesthesia Preprocedure Evaluation (Signed)
 Anesthesia Evaluation  Patient identified by MRN, date of birth, ID band Patient awake    Reviewed: Allergy & Precautions, NPO status , Patient's Chart, lab work & pertinent test results, reviewed documented beta blocker date and time   History of Anesthesia Complications (+) PONV and history of anesthetic complications  Airway Mallampati: III  TM Distance: >3 FB     Dental no notable dental hx.    Pulmonary sleep apnea , neg COPD   breath sounds clear to auscultation       Cardiovascular hypertension, (-) angina (-) CAD and (-) Past MI + Valvular Problems/Murmurs MR  Rhythm:Regular Rate:Normal     Neuro/Psych  Headaches PSYCHIATRIC DISORDERS  Depression     Neuromuscular disease    GI/Hepatic ,GERD  ,,  Endo/Other  diabetesHypothyroidism    Renal/GU      Musculoskeletal  (+) Arthritis ,  Fibromyalgia -  Abdominal   Peds  Hematology   Anesthesia Other Findings   Reproductive/Obstetrics                             Anesthesia Physical Anesthesia Plan  ASA: 2  Anesthesia Plan: General   Post-op Pain Management:    Induction:   PONV Risk Score and Plan: 2 and Ondansetron  and Dexamethasone   Airway Management Planned: Oral ETT and Video Laryngoscope Planned  Additional Equipment:   Intra-op Plan:   Post-operative Plan: Extubation in OR  Informed Consent: I have reviewed the patients History and Physical, chart, labs and discussed the procedure including the risks, benefits and alternatives for the proposed anesthesia with the patient or authorized representative who has indicated his/her understanding and acceptance.     Dental advisory given  Plan Discussed with: CRNA  Anesthesia Plan Comments:        Anesthesia Quick Evaluation

## 2023-06-29 NOTE — Progress Notes (Addendum)
 Post walk pt complains of right leg pain, post ambulation site assessed while supine. Site is level 0 to palpation, pain is 6/10 when ambulating. Page RN also assessed with same level 0 assessment, pt comfortable when laying down. Jenna Feeling PA was paged.  Site assessed by Charlies Feeling PA, pt ready for one more ambulation and will reassess before DC.

## 2023-06-29 NOTE — Anesthesia Procedure Notes (Signed)
 Procedure Name: Intubation Date/Time: 06/29/2023 7:50 AM  Performed by: Christopher Comings, CRNAPre-anesthesia Checklist: Patient identified, Emergency Drugs available, Suction available and Patient being monitored Patient Re-evaluated:Patient Re-evaluated prior to induction Oxygen  Delivery Method: Circle system utilized Preoxygenation: Pre-oxygenation with 100% oxygen  Induction Type: IV induction Ventilation: Mask ventilation without difficulty Laryngoscope Size: Glidescope and 4 Grade View: Grade I Tube type: Oral Tube size: 7.0 mm Number of attempts: 1 Airway Equipment and Method: Oral airway and Rigid stylet Placement Confirmation: ETT inserted through vocal cords under direct vision, positive ETCO2 and breath sounds checked- equal and bilateral Secured at: 22 cm Tube secured with: Tape Dental Injury: Teeth and Oropharynx as per pre-operative assessment

## 2023-06-29 NOTE — Discharge Instructions (Signed)

## 2023-06-30 ENCOUNTER — Encounter (HOSPITAL_COMMUNITY): Payer: Self-pay | Admitting: Cardiology

## 2023-06-30 ENCOUNTER — Other Ambulatory Visit: Payer: Self-pay

## 2023-06-30 ENCOUNTER — Other Ambulatory Visit (HOSPITAL_BASED_OUTPATIENT_CLINIC_OR_DEPARTMENT_OTHER): Payer: Self-pay

## 2023-06-30 ENCOUNTER — Telehealth: Payer: Self-pay | Admitting: Cardiology

## 2023-06-30 ENCOUNTER — Encounter: Payer: Medicare Other | Attending: Physical Medicine & Rehabilitation | Admitting: Registered Nurse

## 2023-06-30 VITALS — BP 130/66 | HR 76 | Ht 64.5 in | Wt 182.0 lb

## 2023-06-30 DIAGNOSIS — G8929 Other chronic pain: Secondary | ICD-10-CM

## 2023-06-30 DIAGNOSIS — Z79891 Long term (current) use of opiate analgesic: Secondary | ICD-10-CM | POA: Diagnosis not present

## 2023-06-30 DIAGNOSIS — G894 Chronic pain syndrome: Secondary | ICD-10-CM

## 2023-06-30 DIAGNOSIS — Z5181 Encounter for therapeutic drug level monitoring: Secondary | ICD-10-CM | POA: Diagnosis not present

## 2023-06-30 DIAGNOSIS — M546 Pain in thoracic spine: Secondary | ICD-10-CM | POA: Diagnosis not present

## 2023-06-30 DIAGNOSIS — M1712 Unilateral primary osteoarthritis, left knee: Secondary | ICD-10-CM

## 2023-06-30 LAB — POCT ACTIVATED CLOTTING TIME: Activated Clotting Time: 325 s

## 2023-06-30 MED ORDER — MORPHINE SULFATE ER 15 MG PO TBCR
15.0000 mg | EXTENDED_RELEASE_TABLET | Freq: Three times a day (TID) | ORAL | 0 refills | Status: DC
  Filled 2023-06-30: qty 90, 30d supply, fill #0

## 2023-06-30 MED ORDER — MORPHINE SULFATE ER 15 MG PO TBCR: 15.0000 mg | EXTENDED_RELEASE_TABLET | Freq: Three times a day (TID) | ORAL | 0 refills | Status: DC

## 2023-06-30 MED FILL — Fentanyl Citrate Preservative Free (PF) Inj 100 MCG/2ML: INTRAMUSCULAR | Qty: 2 | Status: AC

## 2023-06-30 NOTE — Telephone Encounter (Signed)
 LVM to advise pt that app on 02/03 with Jorja Loa was cancelled and rescheduled for 02/03 in [place] with [employer]. A mychart message has been sent to pt as well.

## 2023-06-30 NOTE — Progress Notes (Signed)
 Subjective:    Patient ID: [name], female    DOB: 1951/01/09, 73 y.o.   MRN: [id number]  HPI: [name] is a 73 y.o. female who is scheduled for a Virtual visit today for chronic pain and medication refill. I connected with [name]  by a video enabled telemedicine application and verified that I am speaking with the correct person using two identifiers.  Location: Patient: In her Home  Provider: In the Office    I discussed the limitations of evaluation and management by telemedicine and the availability of in person appointments. The patient expressed understanding and agreed to proceed.  She states her mid- back and reports right groin pain from procedure site on 06/29/2023.She rates her pain 6. Her current exercise regime is walkin.  [name] underwent on 06/29/2023: Dr Cindie ATRIAL FIBRILLATION ABLATION   [name] Morphine  equivalent is 45.00 MME.   Last UDS was Performed on 02/23/2023, it was consistent.    Pain Inventory Average Pain 4 Pain Right Now 6 My pain is constant, burning, and aching  In the last 24 hours, has pain interfered with the following? General activity 10 Relation with others 0 Enjoyment of life 0 What TIME of day is your pain at its worst? evening and night Sleep (in general) Good  Pain is worse with: walking, bending, and some activites Pain improves with: rest and medication Relief from Meds: 4  Family History  Problem Relation Age of Onset   Breast cancer Mother    Diabetes Mother    Heart disease Mother    Allergies Mother    Diabetes Father    Heart disease Father    Emphysema Father    Allergies Father    Breast cancer Paternal Aunt    Colon cancer Neg Hx    Esophageal cancer Neg Hx    Rectal cancer Neg Hx    Stomach cancer Neg Hx    Social History   Socioeconomic History   Marital status: Widowed    Spouse name: Not on file   Number of children: Not on file   Years of education: Not on file    Highest education level: Not on file  Occupational History   Occupation: Catering Manager   Occupation: retired  Tobacco Use   Smoking status: Never    Passive exposure: Past   Smokeless tobacco: Never   Tobacco comments:    Never smoke 01/13/22  Vaping Use   Vaping status: Never Used  Substance and Sexual Activity   Alcohol  use: Yes    Alcohol /week: 1.0 standard drink of alcohol     Types: 1 Glasses of wine per week    Comment: rare   Drug use: Never   Sexual activity: Not Currently  Other Topics Concern   Not on file  Social History Narrative   Not on file   Social Drivers of Health   Financial Resource Strain: Low Risk  (01/19/2023)   Overall Financial Resource Strain (CARDIA)    Difficulty of Paying Living Expenses: Not hard at all  Food Insecurity: No Food Insecurity (01/19/2023)   Hunger Vital Sign    Worried About Running Out of Food in the Last Year: Never true    Ran Out of Food in the Last Year: Never true  Transportation Needs: No Transportation Needs (01/19/2023)   PRAPARE - Administrator, Civil Service (Medical): No    Lack of Transportation (Non-Medical): No  Physical Activity: Insufficiently Active (  01/19/2023)   Exercise Vital Sign    Days of Exercise per Week: 7 days    Minutes of Exercise per Session: 20 min  Stress: No Stress Concern Present (01/19/2023)   [employer] of Occupational Health - Occupational Stress Questionnaire    Feeling of Stress : Not at all  Social Connections: Moderately Integrated (01/19/2023)   Social Connection and Isolation Panel [NHANES]    Frequency of Communication with Friends and Family: More than three times a week    Frequency of Social Gatherings with Friends and Family: More than three times a week    Attends Religious Services: More than 4 times per year    Active Member of [employer] or Organizations: Yes    Attends Banker Meetings: More than 4 times per year    Marital Status: Widowed   Past  Surgical History:  Procedure Laterality Date   39 HOUR PH STUDY N/A 01/04/2016   Procedure: 24 HOUR PH STUDY;  Surgeon: Elspeth Deward Naval, MD;  Location: [facility] ENDOS[facility];  Service: Gastroenterology;  Laterality: N/A;   ABDOMINAL HYSTERECTOMY  1988   ADENOIDECTOMY     ANTERIOR CERVICAL DECOMP/DISCECTOMY FUSION  2009, 1993   first procedure in ~ 1993/Dr Botero   APPENDECTOMY  1988   ATRIAL FIBRILLATION ABLATION N/A 12/16/2021   Procedure: ATRIAL FIBRILLATION ABLATION;  Surgeon: Cindie Ole DASEN, MD;  Location: [facility] INVASIVE CV LAB;  Service: Cardiovascular;  Laterality: N/A;   ATRIAL FIBRILLATION ABLATION N/A 06/29/2023   Procedure: ATRIAL FIBRILLATION ABLATION;  Surgeon: Cindie Ole DASEN, MD;  Location: [facility] INVAS[facility] CV LAB;  Service: Cardiovascular;  Laterality: N/A;   BACK SURGERY     CARDIOVERSION N/A 04/03/2019   Procedure: CARDIOVERSION;  Surgeon: Maranda Leim DEL, MD;  Location: [facility] ENDOSCOPY;  Service: Cardiovascular;  Laterality: N/A;   CHOLECYSTECTOMY  2005   ESOPHAGEAL MANOMETRY N/A 01/04/2016   Procedure: ESOPHAGEAL MANOMETRY (EM) 24 HR PH;  Surgeon: Elspeth Deward Naval, MD;  Location: [facility];  Service: Gastroenterology;  Laterality: N/A;   ESOPHAGOGASTRODUODENOSCOPY (EGD) WITH PROPOFOL  N/A 05/17/2016   Procedure: ESOPHAGOGASTRODUODENOSCOPY (EGD) WITH PROPOFOL ;  Surgeon: Elspeth Deward Naval, MD;  Location: [facility];  Service: Gastroenterology;  Laterality: N/A;   FOOT SURGERY Left 08/2022   NASAL SINUS SURGERY  2006   SAVORY DILATION N/A 05/17/2016   Procedure: SAVORY DILATION;  Surgeon: Elspeth Deward Naval, MD;  Location: [facility] ENDOS[facility];  Service: Gastroenterology;  Laterality: N/A;   TEE WITHOUT CARDIOVERSION N/A 04/03/2019   Procedure: TRANSESOPHAGEAL ECHOCARDIOGRAM (TEE)-with DCCV;  Surgeon: Maranda Leim DEL, MD;  Location: [facility] ENDOSCOPY;; EF 55 to 60%.  No LVH.  Normal RV.  Moderate LA dilation.  No thrombus.  Mild RA dilation.  Mild to moderate TR.  Mild  aortic valve sclerosis, no stenosis.  Moderately elevated PA pressures (47 mmHg)   TONSILLECTOMY AND ADENOIDECTOMY     TRANSTHORACIC ECHOCARDIOGRAM  2009   Normal LV size and function. EF 60-65%. No regional wall motion and amount is. Mild aortic sclerosis, no stenosis   TUBAL LIGATION  1979   Past Surgical History:  Procedure Laterality Date   58 HOUR PH STUDY N/A 01/04/2016   Procedure: 24 HOUR PH STUDY;  Surgeon: Elspeth Deward Naval, MD;  Location: [facility];  Service: Gastroenterology;  Laterality: N/A;   ABDOMINAL HYSTERECTOMY  1988   ADENOIDECTOMY     ANTERIOR CERVICAL DECOMP/DISCECTOMY FUSION  2009, 1993   first procedure in ~ 1993/Dr Botero   APPENDECTOMY  1988   ATRIAL FIBRILLATION ABLATION  N/A 12/16/2021   Procedure: ATRIAL FIBRILLATION ABLATION;  Surgeon: Cindie Ole DASEN, MD;  Location: [facility] INVASIVE CV LAB;  Service: Cardiovascular;  Laterality: N/A;   ATRIAL FIBRILLATION ABLATION N/A 06/29/2023   Procedure: ATRIAL FIBRILLATION ABLATION;  Surgeon: Cindie Ole DASEN, MD;  Location: [facility]VASIVE CV LAB;  Service: Cardiovascular;  Laterality: N/A;   BACK SURGERY     CARDIOVERSION N/A 04/03/2019   Procedure: CARDIOVERSION;  Surgeon: Maranda Leim DEL, MD;  Location: [facility] ENDOSCOPY;  Service: Cardiovascular;  Laterality: N/A;   CHOLECYSTECTOMY  2005   ESOPHAGEAL MANOMETRY N/A 01/04/2016   Procedure: ESOPHAGEAL MANOMETRY (EM) 24 HR PH;  Surgeon: Elspeth Deward Naval, MD;  Location: [facility];  Service: Gastroenterology;  Laterality: N/A;   ESOPHAGOGASTRODUODENOSCOPY (EGD) WITH PROPOFOL  N/A 05/17/2016   Procedure: ESOPHAGOGASTRODUODENOSCOPY (EGD) WITH PROPOFOL ;  Surgeon: Elspeth Deward Naval, MD;  Location: [facility] ENDOSCOPY;  Service: Gastroenterology;  Laterality: N/A;   FOOT SURGERY Left 08/2022   NASAL SINUS SURGERY  2006   SAVORY DILATION N/A 05/17/2016   Procedure: SAVORY DILATION;  Surgeon: Elspeth Deward Naval, MD;  Location: [facility] ENDOSCOPY;  Service:  Gastroenterology;  Laterality: N/A;   TEE WITHOUT CARDIOVERSION N/A 04/03/2019   Procedure: TRANSESOPHAGEAL ECHOCARDIOGRAM (TEE)-with DCCV;  Surgeon: Maranda Leim DEL, MD;  Location: [facility] ENDOSCOPY;; EF 55 to 60%.  No LVH.  Normal RV.  Moderate LA dilation.  No thrombus.  Mild RA dilation.  Mild to moderate TR.  Mild aortic valve sclerosis, no stenosis.  Moderately elevated PA pressures (47 mmHg)   TONSILLECTOMY AND ADENOIDECTOMY     TRANSTHORACIC ECHOCARDIOGRAM  2009   Normal LV size and function. EF 60-65%. No regional wall motion and amount is. Mild aortic sclerosis, no stenosis   TUBAL LIGATION  1979   Past Medical History:  Diagnosis Date   B12 deficiency    Complication of anesthesia    slow to wake up    Depression    Diabetes mellitus type 2, diet-controlled (HCC) 07/24/2018   Fibromyalgia    Generalized hyperhidrosis 04/17/2014   GERD (gastroesophageal reflux disease)    History of rheumatic fever as a child    Hyperlipemia    Hypertension    denies htn on 05/09/16   HYPOTHYROIDISM 05/29/2007   Irritable bowel syndrome    Migraines    hx of    OSA (obstructive sleep apnea) 06/26/2019   Senile calcific aortic valve sclerosis    Aortic Sclerosis-no stenosis   There were no vitals taken for this visit.  Opioid Risk Score:   Fall Risk Score:  `1  Depression screen [facility] 2/9     05/31/2023   11:32 AM 05/03/2023   11:19 AM 03/30/2023   10:15 AM 02/23/2023   10:38 AM 01/19/2023    1:43 PM 10/25/2022    1:08 PM 09/28/2022    2:07 PM  Depression screen PHQ 2/9  Decreased Interest 0 0 0 0 0 0 0  Down, Depressed, Hopeless 0 0 0 0 0 0 0  PHQ - 2 Score 0 0 0 0 0 0 0      Review of Systems     Objective:   Physical Exam Vitals and nursing note reviewed.  Musculoskeletal:     Comments: No Physical Exam Performed: Virtual Visit         Assessment & Plan:  1. Cervical postlaminectomy syndrome:/ Cervicalgia/ Cervical Radiculitis/ with neck and shoulder pain/ Chronic  Midline Thoracic Pain.  Continue exercise routine and using heat therapy. 06/30/2023 Continue : current  medication regimen. Refilled: MS Contin  15 mg one tablet three times a day #90.   We will continue the opioid monitoring program, this consists of regular clinic visits, examinations, urine drug screen, pill counts as well as use of [place]  Controlled Substance Reporting system. A 12 month History has been reviewed on the [place]  Controlled Substance Reporting System on 06/30/2023.   2. Fibromyalgia/ Neuropathy: Continue current medication regimen with Gabapentin , activity and exercise regime. 06/30/2023. 3. Left Knee Pain/ Mild Degenerative Changes S/P : on 12/06/2022 with Dr Carilyn.   LEFT Genicular nerve block x 3, Upper medial, Upper lateral , and Lower Medial under fluoroscopic guidance    On 02/09/2023: S/P LEFT KNEE Genicular nerve Radiofrequency neurotomy x 3 nerves, Upper medial, Upper lateral , and Lower Medial under fluoroscopic guidance . Continue current medication regimen with Voltaren  Gel. 06/30/2023. 4. Bilateral  Ankle Pain:  No complaints today. Continue HEP as tolerated. Continue to monitor.06/30/2023. 5. Muscle Spasm: Continue current medication regimen with  Robaxin . Continue to Monitor.06/30/2023.. 6. Opioid Induced Constipation: No complaints Continue to Monitor. 06/30/2023. 7. Midline Low Back Pain: Continue current medication regime, continue HEP as tolerated. 06/30/2023. 8. Right Gluteal Medius Weakness and Atrophy: No complaints Today:Continue with Hip Abduction Exercises as tolerated: Continue to Monitor. 06/30/2023. 9. Right Hip Pain/ Right Greater Trochanteric Tenderness: No complaints today. Continue to Alternate Ice and Heat Therapy. Continue to Monitor. 06/30/2023  10. Sacroiliac Joint Pain:  No complaints today. S/P Sacroiliac Injection with good relief noted on 11/10/2022. Continue to monitor. 06/30/2023 11. Lumbar Radiculitis: No complaints  today.Continue Gabapentin . Continue HEP as tolerated. Continue current medication regimen. Continue to monitor.  06/30/2023.     F/U in 1 month   Virtual Visit Established Patient Location Of Patient: In Her Home Location of Provider: In the Office

## 2023-06-30 NOTE — Telephone Encounter (Signed)
-----   Message from [name] sent at 06/29/2023  4:22 PM EST ----- Please call this patient to reschedule her 4wk post-ablation appt to my schedule in [place], currently scheduled with afib clinic

## 2023-06-30 NOTE — Telephone Encounter (Signed)
-----   Message from Evergreen Riddle sent at 06/29/2023  4:22 PM EST ----- Please call this patient to reschedule her 4wk post-ablation appt to my schedule in [place], currently scheduled with afib clinic

## 2023-07-03 ENCOUNTER — Other Ambulatory Visit: Payer: Self-pay

## 2023-07-03 ENCOUNTER — Other Ambulatory Visit (HOSPITAL_COMMUNITY): Payer: Self-pay

## 2023-07-03 ENCOUNTER — Encounter: Payer: Self-pay | Admitting: Registered Nurse

## 2023-07-17 ENCOUNTER — Ambulatory Visit: Payer: Medicare Other | Attending: Otolaryngology

## 2023-07-17 DIAGNOSIS — F32A Depression, unspecified: Secondary | ICD-10-CM | POA: Insufficient documentation

## 2023-07-17 DIAGNOSIS — G4733 Obstructive sleep apnea (adult) (pediatric): Secondary | ICD-10-CM | POA: Insufficient documentation

## 2023-07-17 DIAGNOSIS — E785 Hyperlipidemia, unspecified: Secondary | ICD-10-CM | POA: Insufficient documentation

## 2023-07-17 DIAGNOSIS — I4892 Unspecified atrial flutter: Secondary | ICD-10-CM | POA: Insufficient documentation

## 2023-07-17 DIAGNOSIS — E039 Hypothyroidism, unspecified: Secondary | ICD-10-CM | POA: Insufficient documentation

## 2023-07-17 DIAGNOSIS — E119 Type 2 diabetes mellitus without complications: Secondary | ICD-10-CM | POA: Insufficient documentation

## 2023-07-17 DIAGNOSIS — I4891 Unspecified atrial fibrillation: Secondary | ICD-10-CM | POA: Diagnosis not present

## 2023-07-17 DIAGNOSIS — K589 Irritable bowel syndrome without diarrhea: Secondary | ICD-10-CM | POA: Diagnosis not present

## 2023-07-17 DIAGNOSIS — G4734 Idiopathic sleep related nonobstructive alveolar hypoventilation: Secondary | ICD-10-CM | POA: Diagnosis present

## 2023-07-17 DIAGNOSIS — R351 Nocturia: Secondary | ICD-10-CM | POA: Diagnosis present

## 2023-07-17 DIAGNOSIS — I10 Essential (primary) hypertension: Secondary | ICD-10-CM | POA: Diagnosis not present

## 2023-07-17 DIAGNOSIS — R61 Generalized hyperhidrosis: Secondary | ICD-10-CM | POA: Diagnosis not present

## 2023-07-17 DIAGNOSIS — M797 Fibromyalgia: Secondary | ICD-10-CM | POA: Insufficient documentation

## 2023-07-17 DIAGNOSIS — K219 Gastro-esophageal reflux disease without esophagitis: Secondary | ICD-10-CM | POA: Insufficient documentation

## 2023-07-24 ENCOUNTER — Ambulatory Visit: Payer: Medicare Other | Admitting: Cardiology

## 2023-07-26 ENCOUNTER — Ambulatory Visit
Admission: RE | Admit: 2023-07-26 | Discharge: 2023-07-26 | Disposition: A | Discharge status: Home / Self Care | Payer: Medicare Other | Source: Ambulatory Visit | Attending: Family Medicine | Admitting: Family Medicine

## 2023-07-26 DIAGNOSIS — Z780 Asymptomatic menopausal state: Secondary | ICD-10-CM

## 2023-07-27 ENCOUNTER — Encounter: Payer: Self-pay | Admitting: Family Medicine

## 2023-07-29 ENCOUNTER — Telehealth (INDEPENDENT_AMBULATORY_CARE_PROVIDER_SITE_OTHER): Payer: Medicare Other | Admitting: Pulmonary Disease

## 2023-07-29 DIAGNOSIS — G4733 Obstructive sleep apnea (adult) (pediatric): Secondary | ICD-10-CM | POA: Diagnosis not present

## 2023-07-29 NOTE — Telephone Encounter (Signed)
Mod OSA with AHi 22/h Predom REM sleep Severe hypoxia, best for titration study to see if O2 needed with PAP

## 2023-07-29 NOTE — Progress Notes (Unsigned)
Electrophysiology Clinic Note    Date:  07/31/2023  Patient ID:  [name], [name] December 14, 1950, MRN [id number] PCP:  Hannah Beat, MD  Cardiologist:  Bryan Lemma, MD Electrophysiologist: Lanier Prude, MD   Discussed the use of AI scribe software for clinical note transcription with the patient, who gave verbal consent to proceed.   Patient Profile    Chief Complaint: AF ablation follow-up  History of Present Illness: [name] is a 73 y.o. female with PMH notable for persis AFib, typical aflutter, HTN, hypothyroid, fibromyalgia, T2DM, ; seen today for Lanier Prude, MD for routine electrophysiology followup.  She is s/p AF ablation w PVI, posterior wall on 11/2021 She is s/p redo AF ablation w PVI, posterior wall, and CTI on 06/2023.  On follow-up today, she has not had any AFib episodes since ablation 1 month ago. She has not needed her PRN dilt at all. Continues to take 240mg  dilt daily. Denies hypotension or s/s of bradycardia including dizziness, LH, presyncope. Initially after ablation she had reduced energy, but this has recovered fully.  She checks BP sporadically at home, most systolic readings in the 120s. She diligently takes eliquis BID, no missed doses, no bleeding concerns.  She had a recent sleep study with [facility], had not heard results.   AAD History: Flecainide - stopped pre-2025 ablation    ROS:  Please see the history of present illness. All other systems are reviewed and otherwise negative.    Physical Exam    VS:  BP 122/68 (BP Location: Left Arm, Patient Position: Sitting, Cuff Size: Normal)   Pulse (!) 56   Ht 5\' 4"  (1.626 m)   Wt 187 lb 3.2 oz (84.9 kg)   SpO2 96%   BMI 32.13 kg/m  BMI: Body mass index is 32.13 kg/m.  Wt Readings from Last 3 Encounters:  07/31/23 187 lb 3.2 oz (84.9 kg)  06/30/23 182 lb (82.6 kg)  06/29/23 185 lb (83.9 kg)     GEN- The patient is well appearing, alert and oriented x 3 today.   Lungs-  Clear to ausculation bilaterally, normal work of breathing.  Heart- Regular rate and rhythm, 3/6 murmur along LSB, no rubs or gallops Extremities- Trace peripheral edema, warm, dry    Studies Reviewed   Previous EP, cardiology notes.    EKG is ordered. Personal review of EKG from today shows:    EKG Interpretation Date/Time:  Monday July 31 2023 11:22:01 EST Ventricular Rate:  56 PR Interval:  136 QRS Duration:  82 QT Interval:  408 QTC Calculation: 393 R Axis:   43  Text Interpretation: Sinus bradycardia Confirmed by Sherie Don [phone]) on 07/31/2023 11:24:28 AM    CTA cardiac, 06/07/2023 1. There is normal pulmonary vein drainage into the left atrium. (2 on the right and 2 on the left) with ostial measurements as above.  2. The left atrial appendage is a chicken wing type with ostial size 22 x 18 mm and length 33 mm. There is no thrombus in the left atrial appendage.  3. The esophagus runs in the left atrial midline and is not in the proximity to any of the pulmonary veins.  4. Coronary calcium score of 0.  TTE, 05/16/2023  1. Left ventricular ejection fraction, by estimation, is 60 to 65%. Left ventricular ejection fraction by 3D volume is 63 %. The left ventricle has  normal function. The left ventricle has no regional wall motion abnormalities. Left ventricular  diastolic  parameters were normal. The average left ventricular global longitudinal strain is -18.8 %. The global longitudinal strain is normal.   2. Right ventricular systolic function is normal. The right ventricular size is mildly enlarged. There is normal pulmonary artery systolic pressure. The estimated right ventricular systolic pressure is 30.9 mmHg.   3. Left atrial size was mildly dilated.   4. The mitral valve is normal in structure. Mild to moderate mitral valve regurgitation. No evidence of mitral stenosis.   5. Tricuspid valve regurgitation is moderate.   6. The aortic valve is tricuspid. There is mild  calcification of the aortic valve. Aortic valve regurgitation is mild. Aortic valve sclerosis/calcification is present, without any evidence of aortic stenosis.   7. The inferior vena cava is normal in size with greater than 50% respiratory variability, suggesting right atrial pressure of 3 mmHg.     Assessment and Plan     #) persis Afib #) Aflutter S/p AF ablation 11/2021, and redo AFib,flutter ablation 06/2023 Maintaining sinus rhythm since redo ablation Continue 240mg  dilt at this time, consider reducing at follow-up  #) Hypercoag d/t persis afib CHA2DS2-VASc Score = at least 4 [CHF History: 0, HTN History: 1, Diabetes History: 1, Stroke History: 0, Vascular Disease History: 0, Age Score: 1, Gender Score: 1].  Therefore, the patient's annual risk of stroke is 4.8 %.    Stroke ppx - 5mg  eliquis BID, appropriately dosed No bleeding concerns  #) HTN Well-controlled today in office and by home readings Continue 240mg  dilt daily  #) sleep disordered breathing Has completed sleep study with pulm, awaiting results. She has sent mychart msg to NP Clent Ridges to follow-up  Recommended that if she has OSA, strongly recommend treatment of it to prevent future AFib episodes     Current medicines are reviewed at length with the patient today.   The patient does not have concerns regarding her medicines.  The following changes were made today:  none  Labs/ tests ordered today include:  Orders Placed This Encounter  Procedures   EKG 12-Lead     Disposition: Follow up with Dr. Lalla Brothers or EP APP  2 months    Signed, Sherie Don, NP  07/31/23  11:48 AM  Electrophysiology [facility]

## 2023-07-31 ENCOUNTER — Ambulatory Visit: Payer: Medicare Other | Attending: Cardiology | Admitting: Cardiology

## 2023-07-31 ENCOUNTER — Ambulatory Visit (HOSPITAL_COMMUNITY): Payer: Medicare Other | Admitting: Physician Assistant

## 2023-07-31 ENCOUNTER — Encounter: Payer: Self-pay | Admitting: Cardiology

## 2023-07-31 VITALS — BP 122/68 | HR 56 | Ht 64.0 in | Wt 187.2 lb

## 2023-07-31 DIAGNOSIS — I483 Typical atrial flutter: Secondary | ICD-10-CM

## 2023-07-31 DIAGNOSIS — I4819 Other persistent atrial fibrillation: Secondary | ICD-10-CM

## 2023-07-31 DIAGNOSIS — D6869 Other thrombophilia: Secondary | ICD-10-CM

## 2023-07-31 NOTE — Patient Instructions (Signed)
Medication Instructions:  The current medical regimen is effective;  continue present plan and medications.  *If you need a refill on your cardiac medications before your next appointment, please call your pharmacy*   Follow-Up: At [facility], you and your health needs are our priority.  As part of our continuing mission to provide you with exceptional heart care, we have created designated Provider Care Teams.  These Care Teams include your primary Cardiologist (physician) and Advanced Practice Providers (APPs -  Physician Assistants and Nurse Practitioners) who all work together to provide you with the care you need, when you need it.  We recommend signing up for the patient portal called "MyChart".  Sign up information is provided on this After Visit Summary.  MyChart is used to connect with patients for Virtual Visits (Telemedicine).  Patients are able to view lab/test results, encounter notes, upcoming appointments, etc.  Non-urgent messages can be sent to your provider as well.   To learn more about what you can do with MyChart, go to [email or web address].    Your next appointment:   Keep scheduled appointment

## 2023-08-01 ENCOUNTER — Encounter: Payer: Medicare Other | Attending: Physical Medicine & Rehabilitation | Admitting: Registered Nurse

## 2023-08-01 ENCOUNTER — Encounter: Payer: Self-pay | Admitting: Registered Nurse

## 2023-08-01 ENCOUNTER — Encounter: Payer: Medicare Other | Admitting: Registered Nurse

## 2023-08-01 VITALS — BP 116/70 | HR 59 | Ht 64.0 in | Wt 187.0 lb

## 2023-08-01 DIAGNOSIS — Z5181 Encounter for therapeutic drug level monitoring: Secondary | ICD-10-CM

## 2023-08-01 DIAGNOSIS — M533 Sacrococcygeal disorders, not elsewhere classified: Secondary | ICD-10-CM | POA: Insufficient documentation

## 2023-08-01 DIAGNOSIS — M542 Cervicalgia: Secondary | ICD-10-CM | POA: Diagnosis present

## 2023-08-01 DIAGNOSIS — G8929 Other chronic pain: Secondary | ICD-10-CM

## 2023-08-01 DIAGNOSIS — G894 Chronic pain syndrome: Secondary | ICD-10-CM

## 2023-08-01 DIAGNOSIS — M1712 Unilateral primary osteoarthritis, left knee: Secondary | ICD-10-CM | POA: Diagnosis present

## 2023-08-01 DIAGNOSIS — Z79891 Long term (current) use of opiate analgesic: Secondary | ICD-10-CM

## 2023-08-01 DIAGNOSIS — M546 Pain in thoracic spine: Secondary | ICD-10-CM | POA: Diagnosis present

## 2023-08-01 MED ORDER — MORPHINE SULFATE ER 15 MG PO TBCR: 15.0000 mg | EXTENDED_RELEASE_TABLET | Freq: Three times a day (TID) | ORAL | 0 refills | Status: DC

## 2023-08-01 NOTE — Addendum Note (Signed)
Addended by: Glenford Bayley on: 08/01/2023 09:48 AM   Modules accepted: Orders

## 2023-08-01 NOTE — Telephone Encounter (Signed)
Patient was notified by phone regarding the results of her split night sleep study results.  See phone note.  Nothing further needed.

## 2023-08-01 NOTE — Progress Notes (Signed)
 Subjective:    Patient ID: [name], female    DOB: January 29, 1951, 73 y.o.   MRN: [id number]  HPI: [name] is a 73 y.o. female who returns for follow up appointment for chronic pain and medication refill. She states her pain is located in her mid- back and left knee. She rates her pain 4. Her current exercise regime is walking and performing stretching exercises.  Ms. Linam Morphine  equivalent is 45.00 MME.   UDS ordered today.      Pain Inventory Average Pain 4 Pain Right Now 4 My pain is constant and aching  In the last 24 hours, has pain interfered with the following? General activity 0 Relation with others 0 Enjoyment of life 0 What TIME of day is your pain at its worst? evening Sleep (in general) Good  Pain is worse with: walking, bending, and standing Pain improves with: rest, pacing activities, medication, and ice Relief from Meds: 5  Family History  Problem Relation Age of Onset   Breast cancer Mother    Diabetes Mother    Heart disease Mother    Allergies Mother    Diabetes Father    Heart disease Father    Emphysema Father    Allergies Father    Breast cancer Paternal Aunt    Colon cancer Neg Hx    Esophageal cancer Neg Hx    Rectal cancer Neg Hx    Stomach cancer Neg Hx    Social History   Socioeconomic History   Marital status: Widowed    Spouse name: Not on file   Number of children: Not on file   Years of education: Not on file   Highest education level: Not on file  Occupational History   Occupation: Catering Manager   Occupation: retired  Tobacco Use   Smoking status: Never    Passive exposure: Past   Smokeless tobacco: Never   Tobacco comments:    Never smoke 01/13/22  Vaping Use   Vaping status: Never Used  Substance and Sexual Activity   Alcohol  use: Yes    Alcohol /week: 1.0 standard drink of alcohol     Types: 1 Glasses of wine per week    Comment: rare   Drug use: Never   Sexual activity: Not Currently  Other Topics Concern    Not on file  Social History Narrative   Not on file   Social Drivers of Health   Financial Resource Strain: Low Risk  (01/19/2023)   Overall Financial Resource Strain (CARDIA)    Difficulty of Paying Living Expenses: Not hard at all  Food Insecurity: No Food Insecurity (01/19/2023)   Hunger Vital Sign    Worried About Running Out of Food in the Last Year: Never true    Ran Out of Food in the Last Year: Never true  Transportation Needs: No Transportation Needs (01/19/2023)   PRAPARE - Administrator, Civil Service (Medical): No    Lack of Transportation (Non-Medical): No  Physical Activity: Insufficiently Active (01/19/2023)   Exercise Vital Sign    Days of Exercise per Week: 7 days    Minutes of Exercise per Session: 20 min  Stress: No Stress Concern Present (01/19/2023)   [employer] of Occupational Health - Occupational Stress Questionnaire    Feeling of Stress : Not at all  Social Connections: Moderately Integrated (01/19/2023)   Social Connection and Isolation Panel [NHANES]    Frequency of Communication with Friends and Family: More than three times a  week    Frequency of Social Gatherings with Friends and Family: More than three times a week    Attends Religious Services: More than 4 times per year    Active Member of Clubs or Organizations: Yes    Attends Banker Meetings: More than 4 times per year    Marital Status: Widowed   Past Surgical History:  Procedure Laterality Date   62 HOUR PH STUDY N/A 01/04/2016   Procedure: 24 HOUR PH STUDY;  Surgeon: Elspeth Deward Naval, MD;  Location: [facility];  Service: Gastroenterology;  Laterality: N/A;   ABDOMINAL HYSTERECTOMY  1988   ADENOIDECTOMY     ANTERIOR CERVICAL DECOMP/DISCECTOMY FUSION  2009, 1993   first procedure in ~ 1993/Dr Botero   APPENDECTOMY  1988   ATRIAL FIBRILLATION ABLATION N/A 12/16/2021   Procedure: ATRIAL FIBRILLATION ABLATION;  Surgeon: Cindie Ole DASEN, MD;  Location:  [facility];  Service: Cardiovascular;  Laterality: N/A;   ATRIAL FIBRILLATION ABLATION N/A 06/29/2023   Procedure: ATRIAL FIBRILLATION ABLATION;  Surgeon: Cindie Ole DASEN, MD;  Location: [facility];  Service: Cardiovascular;  Laterality: N/A;   BACK SURGERY     CARDIOVERSION N/A 04/03/2019   Procedure: CARDIOVERSION;  Surgeon: Maranda Leim DEL, MD;  Location: [facility] ENDOSCOPY;  Service: Cardiovascular;  Laterality: N/A;   CHOLECYSTECTOMY  2005   ESOPHAGEAL MANOMETRY N/A 01/04/2016   Procedure: ESOPHAGEAL MANOMETRY (EM) 24 HR PH;  Surgeon: Elspeth Deward Naval, MD;  Location: [facility];  Service: Gastroenterology;  Laterality: N/A;   ESOPHAGOGASTRODUODENOSCOPY (EGD) WITH PROPOFOL  N/A 05/17/2016   Procedure: ESOPHAGOGASTRODUODENOSCOPY (EGD) WITH PROPOFOL ;  Surgeon: Elspeth Deward Naval, MD;  Location: [facility];  Service: Gastroenterology;  Laterality: N/A;   FOOT SURGERY Left 08/2022   NASAL SINUS SURGERY  2006   SAVORY DILATION N/A 05/17/2016   Procedure: SAVORY DILATION;  Surgeon: Elspeth Deward Naval, MD;  Location: [facility];  Service: Gastroenterology;  Laterality: N/A;   TEE WITHOUT CARDIOVERSION N/A 04/03/2019   Procedure: TRANSESOPHAGEAL ECHOCARDIOGRAM (TEE)-with DCCV;  Surgeon: Maranda Leim DEL, MD;  Location: [facility] ENDOSCOPY;; EF 55 to 60%.  No LVH.  Normal RV.  Moderate LA dilation.  No thrombus.  Mild RA dilation.  Mild to moderate TR.  Mild aortic valve sclerosis, no stenosis.  Moderately elevated PA pressures (47 mmHg)   TONSILLECTOMY AND ADENOIDECTOMY     TRANSTHORACIC ECHOCARDIOGRAM  2009   Normal LV size and function. EF 60-65%. No regional wall motion and amount is. Mild aortic sclerosis, no stenosis   TUBAL LIGATION  1979   Past Surgical History:  Procedure Laterality Date   51 HOUR PH STUDY N/A 01/04/2016   Procedure: 24 HOUR PH STUDY;  Surgeon: Elspeth Deward Naval, MD;  Location: [facility];  Service: Gastroenterology;  Laterality: N/A;    ABDOMINAL HYSTERECTOMY  1988   ADENOIDECTOMY     ANTERIOR CERVICAL DECOMP/DISCECTOMY FUSION  2009, 1993   first procedure in ~ 1993/Dr Botero   APPENDECTOMY  1988   ATRIAL FIBRILLATION ABLATION N/A 12/16/2021   Procedure: ATRIAL FIBRILLATION ABLATION;  Surgeon: Cindie Ole DASEN, MD;  Location: [facility];  Service: Cardiovascular;  Laterality: N/A;   ATRIAL FIBRILLATION ABLATION N/A 06/29/2023   Procedure: ATRIAL FIBRILLATION ABLATION;  Surgeon: Cindie Ole DASEN, MD;  Location: [facility];  Service: Cardiovascular;  Laterality: N/A;   BACK SURGERY     CARDIOVERSION N/A 04/03/2019   Procedure: CARDIOVERSION;  Surgeon: Maranda Leim DEL, MD;  Location[facility] ENDOSCOPY;  Service: Cardiovascular;  Laterality: N/A;   CHOLECYSTECTOMY  2005   ESOPHAGEAL MANOMETRY N/A 01/04/2016   Procedure: ESOPHAGEAL MANOMETRY (EM) 24 HR PH;  Surgeon: Elspeth Deward Naval, MD;  Location: [facility];  Service: Gastroenterology;  Laterality: N/A;   ESOPHAGOGASTRODUODENOSCOPY (EGD) WITH PROPOFOL  N/A 05/17/2016   Procedure: ESOPHAGOGASTRODUODENOSCOPY (EGD) WITH PROPOFOL ;  Surgeon: Elspeth Deward Naval, MD;  Location: [facility];  Service: Gastroenterology;  Laterality: N/A;   FOOT SURGERY Left 08/2022   NASAL SINUS SURGERY  2006   SAVORY DILATION N/A 05/17/2016   Procedure: SAVORY DILATION;  Surgeon: Elspeth Deward Naval, MD;  Location: [facility];  Service: Gastroenterology;  Laterality: N/A;   TEE WITHOUT CARDIOVERSION N/A 04/03/2019   Procedure: TRANSESOPHAGEAL ECHOCARDIOGRAM (TEE)-with DCCV;  Surgeon: Maranda Leim DEL, MD;  Location: [facility] ENDOSCOPY;; EF 55 to 60%.  No LVH.  Normal RV.  Moderate LA dilation.  No thrombus.  Mild RA dilation.  Mild to moderate TR.  Mild aortic valve sclerosis, no stenosis.  Moderately elevated PA pressures (47 mmHg)   TONSILLECTOMY AND ADENOIDECTOMY     TRANSTHORACIC ECHOCARDIOGRAM  2009   Normal LV size and function. EF 60-65%. No regional wall motion  and amount is. Mild aortic sclerosis, no stenosis   TUBAL LIGATION  1979   Past Medical History:  Diagnosis Date   B12 deficiency    Complication of anesthesia    slow to wake up    Depression    Diabetes mellitus type 2, diet-controlled (HCC) 07/24/2018   Fibromyalgia    Generalized hyperhidrosis 04/17/2014   GERD (gastroesophageal reflux disease)    History of rheumatic fever as a child    Hyperlipemia    Hypertension    denies htn on 05/09/16   HYPOTHYROIDISM 05/29/2007   Irritable bowel syndrome    Migraines    hx of    OSA (obstructive sleep apnea) 06/26/2019   Senile calcific aortic valve sclerosis    Aortic Sclerosis-no stenosis   There were no vitals taken for this visit.  Opioid Risk Score:   Fall Risk Score:  `1  Depression screen [facility] 2/9     06/30/2023   11:10 AM 05/31/2023   11:32 AM 05/03/2023   11:19 AM 03/30/2023   10:15 AM 02/23/2023   10:38 AM 01/19/2023    1:43 PM 10/25/2022    1:08 PM  Depression screen PHQ 2/9  Decreased Interest 0 0 0 0 0 0 0  Down, Depressed, Hopeless 0 0 0 0 0 0 0  PHQ - 2 Score 0 0 0 0 0 0 0    Review of Systems  Musculoskeletal:  Positive for back pain.       Left knee  All other systems reviewed and are negative.      Objective:   Physical Exam Vitals and nursing note reviewed.  Constitutional:      Appearance: Normal appearance.  Cardiovascular:     Rate and Rhythm: Normal rate and regular rhythm.     Pulses: Normal pulses.     Heart sounds: Normal heart sounds.  Pulmonary:     Effort: Pulmonary effort is normal.     Breath sounds: Normal breath sounds.  Musculoskeletal:     Comments: Normal Muscle Bulk and Muscle Testing Reveals:  Upper Extremities: Full ROM and Muscle Strength 5/5  Thoracic Paraspinal Tenderness: T-7-T-10  Lower Extremities: Full ROM and Muscle Strength 5/5 Arises from chair with ease Narrow Based  Gait     Skin:    General: Skin  is warm and dry.  Neurological:     Mental Status: She is  alert and oriented to person, place, and time.  Psychiatric:        Mood and Affect: Mood normal.        Behavior: Behavior normal.         Assessment & Plan:  1. Cervical postlaminectomy syndrome:/ Cervicalgia/ Cervical Radiculitis/ with neck and shoulder pain/ Chronic Midline Thoracic Pain.  Continue exercise routine and using heat therapy. 08/01/2023 Continue : current medication regimen. Refilled: MS Contin  15 mg one tablet three times a day #90.   We will continue the opioid monitoring program, this consists of regular clinic visits, examinations, urine drug screen, pill counts as well as use of [place]  Controlled Substance Reporting system. A 12 month History has been reviewed on the [place]  Controlled Substance Reporting System on 08/01/2023.   2. Fibromyalgia/ Neuropathy: Continue current medication regimen with Gabapentin , activity and exercise regime. 08/01/2023. 3. Left Knee Pain/ Mild Degenerative Changes S/P : on 12/06/2022 with Dr Carilyn.   LEFT Genicular nerve block x 3, Upper medial, Upper lateral , and Lower Medial under fluoroscopic guidance    On 02/09/2023: S/P LEFT KNEE Genicular nerve Radiofrequency neurotomy x 3 nerves, Upper medial, Upper lateral , and Lower Medial under fluoroscopic guidance . Continue current medication regimen with Voltaren  Gel. 08/01/2023. 4. Bilateral  Ankle Pain:  No complaints today. Continue HEP as tolerated. Continue to monitor.08/01/2023. 5. Muscle Spasm: Continue current medication regimen with  Robaxin . Continue to Monitor.08/01/2023.. 6. Opioid Induced Constipation: No complaints Continue to Monitor. 08/01/2023. 7. Midline Low Back Pain: Continue current medication regime, continue HEP as tolerated. 08/01/2023. 8. Right Gluteal Medius Weakness and Atrophy: No complaints Today:Continue with Hip Abduction Exercises as tolerated: Continue to Monitor. 08/01/2023. 9. Right Hip Pain/ Right Greater Trochanteric Tenderness: No  complaints today. Continue to Alternate Ice and Heat Therapy. Continue to Monitor. 08/01/2023  10. Sacroiliac Joint Pain:  No complaints today. S/P Sacroiliac Injection with good relief noted on 11/10/2022. Continue to monitor. 08/01/2023 11. Lumbar Radiculitis: No complaints today.Continue Gabapentin . Continue HEP as tolerated. Continue current medication regimen. Continue to monitor.  08/01/2023.     F/U in 1 month

## 2023-08-01 NOTE — Telephone Encounter (Signed)
Please let patient know sleep study showed moderate sleep apnea with low oxygen level  I will order CPAP titration study in sleep lab

## 2023-08-01 NOTE — Telephone Encounter (Signed)
I called pt and informed her of [name], NP note. Pt verbalized understanding. NFN.

## 2023-08-07 ENCOUNTER — Encounter: Payer: Self-pay | Admitting: Cardiology

## 2023-08-09 NOTE — Telephone Encounter (Signed)
NO - no need for SBE prophylaxis Abx  [facility]

## 2023-08-18 ENCOUNTER — Telehealth: Payer: Self-pay | Admitting: Physical Medicine & Rehabilitation

## 2023-08-18 NOTE — Telephone Encounter (Signed)
Patient calling requesting a left side back injection.  She said her left side is hurting so bad she can hardly walk.  Please let us know if we can schedule an injection.

## 2023-08-21 NOTE — Progress Notes (Unsigned)
 Subjective:    Patient ID: [name], female    DOB: [birth year], 73 y.o.   MRN: [id number]  HPI 73 year old female with chronic postoperative neck pain as well as chronic thoracic pain who has been maintained on stable doses of chronic opioids 45 MME per day She is complaining of left-sided low back pain it is mainly below the waist.  She had no discrete injury.  She has not had pain in this area before she has mainly had right-sided low back and buttock pain in the past.  Onset of pain only about a week ago Right Sacroiliac injection in May 2024 still doing ok  Has been doing a lot of cleaning, bending and stooping more than normal  Has not taken Ibuprofen due to GERD Has been taking extended release acetaminophen  Left sided low back and buttock pain started a few days ago.  Has tried voltaren gel, biofreeze , heating pad  The heating pad worked the best  Pain Inventory Average Pain 5 Pain Right Now 8 My pain is constant, sharp, stabbing, and aching  In the last 24 hours, has pain interfered with the following? General activity 9 Relation with others 5 Enjoyment of life 7 What TIME of day is your pain at its worst? morning , daytime, evening, night, and varies Sleep (in general) Good  Pain is worse with: walking, bending, sitting, standing, and some activites Pain improves with: rest and heat/ice Relief from Meds: 3  Family History  Problem Relation Age of Onset   Breast cancer Mother    Diabetes Mother    Heart disease Mother    Allergies Mother    Diabetes Father    Heart disease Father    Emphysema Father    Allergies Father    Breast cancer Paternal Aunt    Colon cancer Neg Hx    Esophageal cancer Neg Hx    Rectal cancer Neg Hx    Stomach cancer Neg Hx    Social History   Socioeconomic History   Marital status: Widowed    Spouse name: Not on file   Number of children: Not on file   Years of education: Not on file   Highest education level: Not on file   Occupational History   Occupation: Catering manager   Occupation: retired  Tobacco Use   Smoking status: Never    Passive exposure: Past   Smokeless tobacco: Never   Tobacco comments:    Never smoke 01/13/22  Vaping Use   Vaping status: Never Used  Substance and Sexual Activity   Alcohol use: Yes    Alcohol/week: 1.0 standard drink of alcohol    Types: 1 Glasses of wine per week    Comment: rare   Drug use: Never   Sexual activity: Not Currently  Other Topics Concern   Not on file  Social History Narrative   Not on file   Social Drivers of Health   Financial Resource Strain: Low Risk  (01/19/2023)   Overall Financial Resource Strain (CARDIA)    Difficulty of Paying Living Expenses: Not hard at all  Food Insecurity: No Food Insecurity (01/19/2023)   Hunger Vital Sign    Worried About Running Out of Food in the Last Year: Never true    Ran Out of Food in the Last Year: Never true  Transportation Needs: No Transportation Needs (01/19/2023)   PRAPARE - Administrator, Civil Service (Medical): No    Lack of Transportation (Non-Medical): No  Physical Activity: Insufficiently Active (01/19/2023)   Exercise Vital Sign    Days of Exercise per Week: 7 days    Minutes of Exercise per Session: 20 min  Stress: No Stress Concern Present (01/19/2023)   [employer] of Occupational Health - Occupational Stress Questionnaire    Feeling of Stress : Not at all  Social Connections: Moderately Integrated (01/19/2023)   Social Connection and Isolation Panel [NHANES]    Frequency of Communication with Friends and Family: More than three times a week    Frequency of Social Gatherings with Friends and Family: More than three times a week    Attends Religious Services: More than 4 times per year    Active Member of [employer] or Organizations: Yes    Attends Banker Meetings: More than 4 times per year    Marital Status: Widowed   Past Surgical History:  Procedure Laterality  Date   9 HOUR PH STUDY N/A 01/04/2016   Procedure: 24 HOUR PH STUDY;  Surgeon: Ruffin Frederick, MD;  Location: [facility];  Service: Gastroenterology;  Laterality: N/A;   ABDOMINAL HYSTERECTOMY  1988   ADENOIDECTOMY     ANTERIOR CERVICAL DECOMP/DISCECTOMY FUSION  2009, 1993   first procedure in ~ 1993/Dr Botero   APPENDECTOMY  1988   ATRIAL FIBRILLATION ABLATION N/A 12/16/2021   Procedure: ATRIAL FIBRILLATION ABLATION;  Surgeon: Lanier Prude, MD;  Location: [facility];  Service: Cardiovascular;  Laterality: N/A;   ATRIAL FIBRILLATION ABLATION N/A 06/29/2023   Procedure: ATRIAL FIBRILLATION ABLATION;  Surgeon: Lanier Prude, MD;  Location: [facility];  Service: Cardiovascular;  Laterality: N/A;   BACK SURGERY     CARDIOVERSION N/A 04/03/2019   Procedure: CARDIOVERSION;  Surgeon: Lars Masson, MD;  Location: [facility] ENDOSCOPY;  Service: Cardiovascular;  Laterality: N/A;   CHOLECYSTECTOMY  2005   ESOPHAGEAL MANOMETRY N/A 01/04/2016   Procedure: ESOPHAGEAL MANOMETRY (EM) 24 HR PH;  Surgeon: Ruffin Frederick, MD;  Location: [facility];  Service: Gastroenterology;  Laterality: N/A;   ESOPHAGOGASTRODUODENOSCOPY (EGD) WITH PROPOFOL N/A 05/17/2016   Procedure: ESOPHAGOGASTRODUODENOSCOPY (EGD) WITH PROPOFOL;  Surgeon: Ruffin Frederick, MD;  Location: [facility];  Service: Gastroenterology;  Laterality: N/A;   FOOT SURGERY Left 08/2022   NASAL SINUS SURGERY  2006   SAVORY DILATION N/A 05/17/2016   Procedure: SAVORY DILATION;  Surgeon: Ruffin Frederick, MD;  Location: [facility];  Service: Gastroenterology;  Laterality: N/A;   TEE WITHOUT CARDIOVERSION N/A 04/03/2019   Procedure: TRANSESOPHAGEAL ECHOCARDIOGRAM (TEE)-with DCCV;  Surgeon: Lars Masson, MD;  Location: [facility] ENDOSCOPY;; EF 55 to 60%.  No LVH.  Normal RV.  Moderate LA dilation.  No thrombus.  Mild RA dilation.  Mild to moderate TR.  Mild aortic valve sclerosis, no stenosis.   Moderately elevated PA pressures (47 mmHg)   TONSILLECTOMY AND ADENOIDECTOMY     TRANSTHORACIC ECHOCARDIOGRAM  2009   Normal LV size and function. EF 60-65%. No regional wall motion and amount is. Mild aortic sclerosis, no stenosis   TUBAL LIGATION  1979   Past Surgical History:  Procedure Laterality Date   70 HOUR PH STUDY N/A 01/04/2016   Procedure: 24 HOUR PH STUDY;  Surgeon: Ruffin Frederick, MD;  Location: [facility];  Service: Gastroenterology;  Laterality: N/A;   ABDOMINAL HYSTERECTOMY  1988   ADENOIDECTOMY     ANTERIOR CERVICAL DECOMP/DISCECTOMY FUSION  2009, 1993   first procedure in ~ 1993/Dr Botero   APPENDECTOMY  1988  ATRIAL FIBRILLATION ABLATION N/A 12/16/2021   Procedure: ATRIAL FIBRILLATION ABLATION;  Surgeon: Lanier Prude, MD;  Location: [facility]AS[facility] CV LAB;  Service: Cardiovascular;  Laterality: N/A;   ATRIAL FIBRILLATION ABLATION N/A 06/29/2023   Procedure: ATRIAL FIBRILLATION ABLATION;  Surgeon: Lanier Prude, MD;  Location: [facility];  Service: Cardiovascular;  Laterality: N/A;   BACK SURGERY     CARDIOVERSION N/A 04/03/2019   Procedure: CARDIOVERSION;  Surgeon: Lars Masson, MD;  Location: [facility] ENDOSCOPY;  Service: Cardiovascular;  Laterality: N/A;   CHOLECYSTECTOMY  2005   ESOPHAGEAL MANOMETRY N/A 01/04/2016   Procedure: ESOPHAGEAL MANOMETRY (EM) 24 HR PH;  Surgeon: Ruffin Frederick, MD;  Location: [facility];  Service: Gastroenterology;  Laterality: N/A;   ESOPHAGOGASTRODUODENOSCOPY (EGD) WITH PROPOFOL N/A 05/17/2016   Procedure: ESOPHAGOGASTRODUODENOSCOPY (EGD) WITH PROPOFOL;  Surgeon: Ruffin Frederick, MD;  Location: [facility];  Service: Gastroenterology;  Laterality: N/A;   FOOT SURGERY Left 08/2022   NASAL SINUS SURGERY  2006   SAVORY DILATION N/A 05/17/2016   Procedure: SAVORY DILATION;  Surgeon: Ruffin Frederick, MD;  Location: [facility];  Service: Gastroenterology;  Laterality: N/A;   TEE WITHOUT  CARDIOVERSION N/A 04/03/2019   Procedure: TRANSESOPHAGEAL ECHOCARDIOGRAM (TEE)-with DCCV;  Surgeon: Lars Masson, MD;  Location: [facility] ENDOSCOPY;; EF 55 to 60%.  No LVH.  Normal RV.  Moderate LA dilation.  No thrombus.  Mild RA dilation.  Mild to moderate TR.  Mild aortic valve sclerosis, no stenosis.  Moderately elevated PA pressures (47 mmHg)   TONSILLECTOMY AND ADENOIDECTOMY     TRANSTHORACIC ECHOCARDIOGRAM  2009   Normal LV size and function. EF 60-65%. No regional wall motion and amount is. Mild aortic sclerosis, no stenosis   TUBAL LIGATION  1979   Past Medical History:  Diagnosis Date   B12 deficiency    Complication of anesthesia    slow to wake up    Depression    Diabetes mellitus type 2, diet-controlled (HCC) 07/24/2018   Fibromyalgia    Generalized hyperhidrosis 04/17/2014   GERD (gastroesophageal reflux disease)    History of rheumatic fever as a child    Hyperlipemia    Hypertension    denies htn on 05/09/16   HYPOTHYROIDISM 05/29/2007   Irritable bowel syndrome    Migraines    hx of    OSA (obstructive sleep apnea) 06/26/2019   Senile calcific aortic valve sclerosis    Aortic Sclerosis-no stenosis   There were no vitals taken for this visit.  Opioid Risk Score:   Fall Risk Score:  `1  Depression screen [facility] 2/9     08/01/2023   11:21 AM 06/30/2023   11:10 AM 05/31/2023   11:32 AM 05/03/2023   11:19 AM 03/30/2023   10:15 AM 02/23/2023   10:38 AM 01/19/2023    1:43 PM  Depression screen PHQ 2/9  Decreased Interest 0 0 0 0 0 0 0  Down, Depressed, Hopeless 0 0 0 0 0 0 0  PHQ - 2 Score 0 0 0 0 0 0 0    Review of Systems  Musculoskeletal:  Positive for gait problem.       Left mid buttock pain   All other systems reviewed and are negative.      Objective:   Physical Exam  Gaenslens: Negative Sacral thrust (prone) : Negative Lateral compression: Negative FABER's: Positive Distraction (supine): Negative Thigh thrust test: Negative  Lumbar spine 50%  range flexion extension lateral bending and rotation flexion  is more painful than extension and bending to the right is more painful than the left Negative straight leg raise bilaterally Lower extremity strength is normal hip flexion knee extension With without assistive device no toe drag or knee instability.     Assessment & Plan:     Left-sided low back pain predominantly below L5 however sacroiliac provocative testing essentially negative.  This is fairly new issue and should improve with time.  She complains that this is interfering with her usual functional activities and she is already on morphine.  Will give 1 week supply of prednisone 20 mg/day.  She will follow-up with nurse practitioner next week.  If not much better consider x-rays and some physical therapy.  At this point I do not think she needs a sacroiliac injection, will wait until pain has not improved after 4 weeks

## 2023-08-22 ENCOUNTER — Encounter (HOSPITAL_BASED_OUTPATIENT_CLINIC_OR_DEPARTMENT_OTHER): Payer: Medicare Other | Admitting: Physical Medicine & Rehabilitation

## 2023-08-22 ENCOUNTER — Encounter: Payer: Self-pay | Admitting: Physical Medicine & Rehabilitation

## 2023-08-22 VITALS — BP 115/75 | HR 63 | Ht 64.0 in | Wt 188.0 lb

## 2023-08-22 DIAGNOSIS — G894 Chronic pain syndrome: Secondary | ICD-10-CM | POA: Diagnosis not present

## 2023-08-22 DIAGNOSIS — M533 Sacrococcygeal disorders, not elsewhere classified: Secondary | ICD-10-CM

## 2023-08-22 DIAGNOSIS — G8929 Other chronic pain: Secondary | ICD-10-CM

## 2023-08-22 DIAGNOSIS — M542 Cervicalgia: Secondary | ICD-10-CM

## 2023-08-22 MED ORDER — PREDNISONE 20 MG PO TABS
20.0000 mg | ORAL_TABLET | Freq: Every day | ORAL | 0 refills | Status: DC
Start: 2023-08-22 — End: 2023-09-04

## 2023-08-22 NOTE — Patient Instructions (Addendum)
 Back Exercises These exercises help to make your trunk and back strong. They also help to keep the lower back flexible. Doing these exercises can help to prevent or lessen pain in your lower back. If you have back pain, try to do these exercises 2-3 times each day or as told by your doctor. As you get better, do the exercises once each day. Repeat the exercises more often as told by your doctor. To stop back pain from coming back, do the exercises once each day, or as told by your doctor. Do exercises exactly as told by your doctor. Stop right away if you feel sudden pain or your pain gets worse. Exercises Single knee to chest Do these steps 3-5 times in a row for each leg: Lie on your back on a firm bed or the floor with your legs stretched out. Bring one knee to your chest. Grab your knee or thigh with both hands and hold it in place. Pull on your knee until you feel a gentle stretch in your lower back or butt. Keep doing the stretch for 10-30 seconds. Slowly let go of your leg and straighten it. Pelvic tilt Do these steps 5-10 times in a row: Lie on your back on a firm bed or the floor with your legs stretched out. Bend your knees so they point up to the ceiling. Your feet should be flat on the floor. Tighten your lower belly (abdomen) muscles to press your lower back against the floor. This will make your tailbone point up to the ceiling instead of pointing down to your feet or the floor. Stay in this position for 5-10 seconds while you gently tighten your muscles and breathe evenly. Cat-cow Do these steps until your lower back bends more easily: Get on your hands and knees on a firm bed or the floor. Keep your hands under your shoulders, and keep your knees under your hips. You may put padding under your knees. Let your head hang down toward your chest. Tighten (contract) the muscles in your belly. Point your tailbone toward the floor so your lower back becomes rounded like the back of a  cat. Stay in this position for 5 seconds. Slowly lift your head. Let the muscles of your belly relax. Point your tailbone up toward the ceiling so your back forms a sagging arch like the back of a cow. Stay in this position for 5 seconds.  Press-ups Do these steps 5-10 times in a row: Lie on your belly (face-down) on a firm bed or the floor. Place your hands near your head, about shoulder-width apart. While you keep your back relaxed and keep your hips on the floor, slowly straighten your arms to raise the top half of your body and lift your shoulders. Do not use your back muscles. You may change where you place your hands to make yourself more comfortable. Stay in this position for 5 seconds. Keep your back relaxed. Slowly return to lying flat on the floor.  Bridges Do these steps 10 times in a row: Lie on your back on a firm bed or the floor. Bend your knees so they point up to the ceiling. Your feet should be flat on the floor. Your arms should be flat at your sides, next to your body. Tighten your butt muscles and lift your butt off the floor until your waist is almost as high as your knees. If you do not feel the muscles working in your butt and the back of  your thighs, slide your feet 1-2 inches (2.5-5 cm) farther away from your butt. Stay in this position for 3-5 seconds. Slowly lower your butt to the floor, and let your butt muscles relax. If this exercise is too easy, try doing it with your arms crossed over your chest. Belly crunches Do these steps 5-10 times in a row: Lie on your back on a firm bed or the floor with your legs stretched out. Bend your knees so they point up to the ceiling. Your feet should be flat on the floor. Cross your arms over your chest. Tip your chin a little bit toward your chest, but do not bend your neck. Tighten your belly muscles and slowly raise your chest just enough to lift your shoulder blades a tiny bit off the floor. Avoid raising your body  higher than that because it can put too much stress on your lower back. Slowly lower your chest and your head to the floor. Back lifts Do these steps 5-10 times in a row: Lie on your belly (face-down) with your arms at your sides, and rest your forehead on the floor. Tighten the muscles in your legs and your butt. Slowly lift your chest off the floor while you keep your hips on the floor. Keep the back of your head in line with the curve in your back. Look at the floor while you do this. Stay in this position for 3-5 seconds. Slowly lower your chest and your face to the floor. Contact a doctor if: Your back pain gets a lot worse when you do an exercise. Your back pain does not get better within 2 hours after you exercise. If you have any of these problems, stop doing the exercises. Do not do them again unless your doctor says it is okay. Get help right away if: You have sudden, very bad back pain. If this happens, stop doing the exercises. Do not do them again unless your doctor says it is okay. This information is not intended to replace advice given to you by your health care provider. Make sure you discuss any questions you have with your health care provider. Document Revised: 08/26/2020 Document Reviewed: 08/26/2020 Elsevier Patient Education  2024 [employer].

## 2023-08-23 ENCOUNTER — Other Ambulatory Visit: Payer: Self-pay | Admitting: Cardiology

## 2023-08-30 ENCOUNTER — Encounter: Payer: Medicare Other | Attending: Physical Medicine & Rehabilitation | Admitting: Registered Nurse

## 2023-08-30 ENCOUNTER — Encounter: Payer: Self-pay | Admitting: Registered Nurse

## 2023-08-30 ENCOUNTER — Ambulatory Visit
Admission: RE | Admit: 2023-08-30 | Discharge: 2023-08-30 | Disposition: A | Discharge status: Home / Self Care | Source: Ambulatory Visit | Attending: Registered Nurse

## 2023-08-30 VITALS — BP 110/64 | HR 65 | Ht 64.0 in | Wt 187.0 lb

## 2023-08-30 DIAGNOSIS — R0789 Other chest pain: Secondary | ICD-10-CM | POA: Diagnosis not present

## 2023-08-30 DIAGNOSIS — Z760 Encounter for issue of repeat prescription: Secondary | ICD-10-CM | POA: Diagnosis not present

## 2023-08-30 DIAGNOSIS — M1712 Unilateral primary osteoarthritis, left knee: Secondary | ICD-10-CM | POA: Diagnosis not present

## 2023-08-30 DIAGNOSIS — M546 Pain in thoracic spine: Secondary | ICD-10-CM | POA: Insufficient documentation

## 2023-08-30 DIAGNOSIS — K5903 Drug induced constipation: Secondary | ICD-10-CM | POA: Insufficient documentation

## 2023-08-30 DIAGNOSIS — W19XXXD Unspecified fall, subsequent encounter: Secondary | ICD-10-CM | POA: Insufficient documentation

## 2023-08-30 DIAGNOSIS — R0781 Pleurodynia: Secondary | ICD-10-CM | POA: Insufficient documentation

## 2023-08-30 DIAGNOSIS — M25511 Pain in right shoulder: Secondary | ICD-10-CM | POA: Diagnosis not present

## 2023-08-30 DIAGNOSIS — G894 Chronic pain syndrome: Secondary | ICD-10-CM | POA: Diagnosis present

## 2023-08-30 DIAGNOSIS — Z5181 Encounter for therapeutic drug level monitoring: Secondary | ICD-10-CM | POA: Insufficient documentation

## 2023-08-30 DIAGNOSIS — Z7182 Exercise counseling: Secondary | ICD-10-CM | POA: Diagnosis not present

## 2023-08-30 DIAGNOSIS — G8929 Other chronic pain: Secondary | ICD-10-CM

## 2023-08-30 DIAGNOSIS — M62838 Other muscle spasm: Secondary | ICD-10-CM | POA: Diagnosis not present

## 2023-08-30 DIAGNOSIS — Z9049 Acquired absence of other specified parts of digestive tract: Secondary | ICD-10-CM | POA: Insufficient documentation

## 2023-08-30 DIAGNOSIS — M25551 Pain in right hip: Secondary | ICD-10-CM | POA: Insufficient documentation

## 2023-08-30 DIAGNOSIS — M961 Postlaminectomy syndrome, not elsewhere classified: Secondary | ICD-10-CM | POA: Diagnosis not present

## 2023-08-30 DIAGNOSIS — M533 Sacrococcygeal disorders, not elsewhere classified: Secondary | ICD-10-CM | POA: Diagnosis not present

## 2023-08-30 DIAGNOSIS — M5416 Radiculopathy, lumbar region: Secondary | ICD-10-CM | POA: Insufficient documentation

## 2023-08-30 DIAGNOSIS — M545 Low back pain, unspecified: Secondary | ICD-10-CM | POA: Insufficient documentation

## 2023-08-30 DIAGNOSIS — M25571 Pain in right ankle and joints of right foot: Secondary | ICD-10-CM | POA: Diagnosis not present

## 2023-08-30 DIAGNOSIS — M797 Fibromyalgia: Secondary | ICD-10-CM | POA: Diagnosis not present

## 2023-08-30 DIAGNOSIS — T402X5A Adverse effect of other opioids, initial encounter: Secondary | ICD-10-CM | POA: Diagnosis not present

## 2023-08-30 DIAGNOSIS — M25572 Pain in left ankle and joints of left foot: Secondary | ICD-10-CM | POA: Diagnosis not present

## 2023-08-30 DIAGNOSIS — Y92009 Unspecified place in unspecified non-institutional (private) residence as the place of occurrence of the external cause: Secondary | ICD-10-CM | POA: Diagnosis not present

## 2023-08-30 DIAGNOSIS — E1140 Type 2 diabetes mellitus with diabetic neuropathy, unspecified: Secondary | ICD-10-CM | POA: Diagnosis not present

## 2023-08-30 DIAGNOSIS — R531 Weakness: Secondary | ICD-10-CM | POA: Insufficient documentation

## 2023-08-30 DIAGNOSIS — Z79891 Long term (current) use of opiate analgesic: Secondary | ICD-10-CM | POA: Insufficient documentation

## 2023-08-30 DIAGNOSIS — M5412 Radiculopathy, cervical region: Secondary | ICD-10-CM | POA: Insufficient documentation

## 2023-08-30 MED ORDER — MORPHINE SULFATE ER 15 MG PO TBCR
15.0000 mg | EXTENDED_RELEASE_TABLET | Freq: Three times a day (TID) | ORAL | 0 refills | Status: DC
Start: 2023-08-30 — End: 2023-09-27

## 2023-08-30 NOTE — Progress Notes (Signed)
 Subjective:    Patient ID: [name], female    DOB: Nov 21, 1950, 73 y.o.   MRN: [id number]  HPI: [name] is a 73 y.o. female who returns for follow up appointment for chronic pain and medication refill. She states her pain is located in her mid- lower back pain and left rib pain. [name] reports on 08/05/2023, she was pulling a door handle in her home and the handle came off the door and she fell backwards. She was able to pick herself up, she didn't seek medical attention. Educated on falls prevention, she verbalizes understanding.  She rates her pain 8. Her current exercise regime is walking.  [name] Morphine equivalent is 45.00 MME.   Oral Swab was Performed today.    Pain Inventory Average Pain 7 Pain Right Now 8 My pain is constant, sharp, stabbing, and aching  In the last 24 hours, has pain interfered with the following? General activity 10 Relation with others 7 Enjoyment of life 7 What TIME of day is your pain at its worst? morning , daytime, evening, and night Sleep (in general) Good  Pain is worse with: bending, sitting, standing, and some activites Pain improves with: rest, heat/ice, medication, and stretching Relief from Meds: 3  Family History  Problem Relation Age of Onset   Breast cancer Mother    Diabetes Mother    Heart disease Mother    Allergies Mother    Diabetes Father    Heart disease Father    Emphysema Father    Allergies Father    Breast cancer Paternal Aunt    Colon cancer Neg Hx    Esophageal cancer Neg Hx    Rectal cancer Neg Hx    Stomach cancer Neg Hx    Social History   Socioeconomic History   Marital status: Widowed    Spouse name: Not on file   Number of children: Not on file   Years of education: Not on file   Highest education level: Not on file  Occupational History   Occupation: Catering manager   Occupation: retired  Tobacco Use   Smoking status: Never    Passive exposure: Past   Smokeless tobacco: Never    Tobacco comments:    Never smoke 01/13/22  Vaping Use   Vaping status: Never Used  Substance and Sexual Activity   Alcohol use: Yes    Alcohol/week: 1.0 standard drink of alcohol    Types: 1 Glasses of wine per week    Comment: rare   Drug use: Never   Sexual activity: Not Currently  Other Topics Concern   Not on file  Social History Narrative   Not on file   Social Drivers of Health   Financial Resource Strain: Low Risk  (01/19/2023)   Overall Financial Resource Strain (CARDIA)    Difficulty of Paying Living Expenses: Not hard at all  Food Insecurity: No Food Insecurity (01/19/2023)   Hunger Vital Sign    Worried About Running Out of Food in the Last Year: Never true    Ran Out of Food in the Last Year: Never true  Transportation Needs: No Transportation Needs (01/19/2023)   PRAPARE - Administrator, Civil Service (Medical): No    Lack of Transportation (Non-Medical): No  Physical Activity: Insufficiently Active (01/19/2023)   Exercise Vital Sign    Days of Exercise per Week: 7 days    Minutes of Exercise per Session: 20 min  Stress: No Stress Concern  Present (01/19/2023)   [employer] of Occupational Health - Occupational Stress Questionnaire    Feeling of Stress : Not at all  Social Connections: Moderately Integrated (01/19/2023)   Social Connection and Isolation Panel [NHANES]    Frequency of Communication with Friends and Family: More than three times a week    Frequency of Social Gatherings with Friends and Family: More than three times a week    Attends Religious Services: More than 4 times per year    Active Member of [employer] or Organizations: Yes    Attends Banker Meetings: More than 4 times per year    Marital Status: Widowed   Past Surgical History:  Procedure Laterality Date   75 HOUR PH STUDY N/A 01/04/2016   Procedure: 24 HOUR PH STUDY;  Surgeon: Ruffin Frederick, MD;  Location: [facility];  Service: Gastroenterology;   Laterality: N/A;   ABDOMINAL HYSTERECTOMY  1988   ADENOIDECTOMY     ANTERIOR CERVICAL DECOMP/DISCECTOMY FUSION  2009, 1993   first procedure in ~ 1993/Dr Botero   APPENDECTOMY  1988   ATRIAL FIBRILLATION ABLATION N/A 12/16/2021   Procedure: ATRIAL FIBRILLATION ABLATION;  Surgeon: Lanier Prude, MD;  Location: [facility];  Service: Cardiovascular;  Laterality: N/A;   ATRIAL FIBRILLATION ABLATION N/A 06/29/2023   Procedure: ATRIAL FIBRILLATION ABLATION;  Surgeon: Lanier Prude, MD;  Location: [facility];  Service: Cardiovascular;  Laterality: N/A;   BACK SURGERY     CARDIOVERSION N/A 04/03/2019   Procedure: CARDIOVERSION;  Surgeon: Lars Masson, MD;  Location: [facility];  Service: Cardiovascular;  Laterality: N/A;   CHOLECYSTECTOMY  2005   ESOPHAGEAL MANOMETRY N/A 01/04/2016   Procedure: ESOPHAGEAL MANOMETRY (EM) 24 HR PH;  Surgeon: Ruffin Frederick, MD;  Location: [facility];  Service: Gastroenterology;  Laterality: N/A;   ESOPHAGOGASTRODUODENOSCOPY (EGD) WITH PROPOFOL N/A 05/17/2016   Procedure: ESOPHAGOGASTRODUODENOSCOPY (EGD) WITH PROPOFOL;  Surgeon: Ruffin Frederick, MD;  Location: [facility];  Service: Gastroenterology;  Laterality: N/A;   FOOT SURGERY Left 08/2022   NASAL SINUS SURGERY  2006   SAVORY DILATION N/A 05/17/2016   Procedure: SAVORY DILATION;  Surgeon: Ruffin Frederick, MD;  Location: [facility];  Service: Gastroenterology;  Laterality: N/A;   TEE WITHOUT CARDIOVERSION N/A 04/03/2019   Procedure: TRANSESOPHAGEAL ECHOCARDIOGRAM (TEE)-with DCCV;  Surgeon: Lars Masson, MD;  Location: [facility];; EF 55 to 60%.  No LVH.  Normal RV.  Moderate LA dilation.  No thrombus.  Mild RA dilation.  Mild to moderate TR.  Mild aortic valve sclerosis, no stenosis.  Moderately elevated PA pressures (47 mmHg)   TONSILLECTOMY AND ADENOIDECTOMY     TRANSTHORACIC ECHOCARDIOGRAM  2009   Normal LV size and function. EF 60-65%. No  regional wall motion and amount is. Mild aortic sclerosis, no stenosis   TUBAL LIGATION  1979   Past Surgical History:  Procedure Laterality Date   46 HOUR PH STUDY N/A 01/04/2016   Procedure: 24 HOUR PH STUDY;  Surgeon: Ruffin Frederick, MD;  Location: [facility];  Service: Gastroenterology;  Laterality: N/A;   ABDOMINAL HYSTERECTOMY  1988   ADENOIDECTOMY     ANTERIOR CERVICAL DECOMP/DISCECTOMY FUSION  2009, 1993   first procedure in ~ 1993/Dr Botero   APPENDECTOMY  1988   ATRIAL FIBRILLATION ABLATION N/A 12/16/2021   Procedure: ATRIAL FIBRILLATION ABLATION;  Surgeon: Lanier Prude, MD;  Location: [facility];  Service: Cardiovascular;  Laterality: N/A;   ATRIAL FIBRILLATION ABLATION  N/A 06/29/2023   Procedure: ATRIAL FIBRILLATION ABLATION;  Surgeon: Lanier Prude, MD;  Location: [facility] INVASIVE CV LAB;  Service: Cardiovascular;  Laterality: N/A;   BACK SURGERY     CARDIOVERSION N/A 04/03/2019   Procedure: CARDIOVERSION;  Surgeon: Lars Masson, MD;  Location: [facility] ENDOSCOPY;  Service: Cardiovascular;  Laterality: N/A;   CHOLECYSTECTOMY  2005   ESOPHAGEAL MANOMETRY N/A 01/04/2016   Procedure: ESOPHAGEAL MANOMETRY (EM) 24 HR PH;  Surgeon: Ruffin Frederick, MD;  Location: [facility];  Service: Gastroenterology;  Laterality: N/A;   ESOPHAGOGASTRODUODENOSCOPY (EGD) WITH PROPOFOL N/A 05/17/2016   Procedure: ESOPHAGOGASTRODUODENOSCOPY (EGD) WITH PROPOFOL;  Surgeon: Ruffin Frederick, MD;  Location: [facility];  Service: Gastroenterology;  Laterality: N/A;   FOOT SURGERY Left 08/2022   NASAL SINUS SURGERY  2006   SAVORY DILATION N/A 05/17/2016   Procedure: SAVORY DILATION;  Surgeon: Ruffin Frederick, MD;  Location: [facility];  Service: Gastroenterology;  Laterality: N/A;   TEE WITHOUT CARDIOVERSION N/A 04/03/2019   Procedure: TRANSESOPHAGEAL ECHOCARDIOGRAM (TEE)-with DCCV;  Surgeon: Lars Masson, MD;  Location: [facility] ENDOSCOPY;; EF 55 to 60%.   No LVH.  Normal RV.  Moderate LA dilation.  No thrombus.  Mild RA dilation.  Mild to moderate TR.  Mild aortic valve sclerosis, no stenosis.  Moderately elevated PA pressures (47 mmHg)   TONSILLECTOMY AND ADENOIDECTOMY     TRANSTHORACIC ECHOCARDIOGRAM  2009   Normal LV size and function. EF 60-65%. No regional wall motion and amount is. Mild aortic sclerosis, no stenosis   TUBAL LIGATION  1979   Past Medical History:  Diagnosis Date   B12 deficiency    Complication of anesthesia    slow to wake up    Depression    Diabetes mellitus type 2, diet-controlled (HCC) 07/24/2018   Fibromyalgia    Generalized hyperhidrosis 04/17/2014   GERD (gastroesophageal reflux disease)    History of rheumatic fever as a child    Hyperlipemia    Hypertension    denies htn on 05/09/16   HYPOTHYROIDISM 05/29/2007   Irritable bowel syndrome    Migraines    hx of    OSA (obstructive sleep apnea) 06/26/2019   Senile calcific aortic valve sclerosis    Aortic Sclerosis-no stenosis   There were no vitals taken for this visit.  Opioid Risk Score:   Fall Risk Score:  `1  Depression screen [facility] 2/9     08/22/2023    1:03 PM 08/01/2023   11:21 AM 06/30/2023   11:10 AM 05/31/2023   11:32 AM 05/03/2023   11:19 AM 03/30/2023   10:15 AM 02/23/2023   10:38 AM  Depression screen PHQ 2/9  Decreased Interest 0 0 0 0 0 0 0  Down, Depressed, Hopeless 0 0 0 0 0 0 0  PHQ - 2 Score 0 0 0 0 0 0 0    Review of Systems  HENT:         Right jaw pain  Musculoskeletal:  Positive for gait problem.       Left hip, left rib cage  All other systems reviewed and are negative.     Objective:   Physical Exam Vitals and nursing note reviewed.  Constitutional:      Appearance: Normal appearance.  Cardiovascular:     Rate and Rhythm: Normal rate and regular rhythm.     Pulses: Normal pulses.     Heart sounds: Normal heart sounds.  Pulmonary:     Effort: Pulmonary effort is  normal.     Breath sounds: Normal breath sounds.   Musculoskeletal:     Comments: Normal Muscle Bulk and Muscle Testing Reveals:  Upper Extremities: Full ROM and Muscle Strength 5/5 Left Side Rib Tenderness: T-4-T-6  Thoracic Paraspinal Tenderness: T-7-T-10 Lumbar Hypersensitivity: Mainly Left Side  Sacral Tenderness: Mainly Left Side  Lower Extremities : Full ROM and Muscle Strength 5/5 Arises from Table slowly Narrow Based  Gait     Skin:    General: Skin is warm and dry.  Neurological:     Mental Status: She is alert and oriented to person, place, and time.  Psychiatric:        Mood and Affect: Mood normal.        Behavior: Behavior normal.         Assessment & Plan:  1.Acute Exacerbation of Chronic Low Back Pain: RX: Lumbar X-ray and Rx: Physical Therapy order. Continue to monitor. 2.Cervical postlaminectomy syndrome:/ Cervicalgia/ Cervical Radiculitis/ with neck and shoulder pain/ Chronic Midline Thoracic Pain.  Continue exercise routine and using heat therapy. 08/30/2023 Continue : current medication regimen. Refilled: MS Contin 15 mg one tablet three times a day #90.   We will continue the opioid monitoring program, this consists of regular clinic visits, examinations, urine drug screen, pill counts as well as use of [place] Controlled Substance Reporting system. A 12 month History has been reviewed on the [place] Controlled Substance Reporting System on 08/30/2023.   3. Fibromyalgia/ Neuropathy: Continue current medication regimen with Gabapentin, activity and exercise regime. 08/30/2023. 4. Left Knee Pain/ Mild Degenerative Changes S/P : on 12/06/2022 with Dr Wynn Banker.   LEFT Genicular nerve block x 3, Upper medial, Upper lateral , and Lower Medial under fluoroscopic guidance    On 02/09/2023: S/P LEFT KNEE Genicular nerve Radiofrequency neurotomy x 3 nerves, Upper medial, Upper lateral , and Lower Medial under fluoroscopic guidance . Continue current medication regimen with Voltaren Gel. 08/30/2023. 5.  Bilateral  Ankle Pain:  No complaints today. Continue HEP as tolerated. Continue to monitor.08/30/2023. 6. Muscle Spasm: Continue current medication regimen with  Robaxin. Continue to Monitor.08/30/2023.. 7. Opioid Induced Constipation: No complaints Continue to Monitor. 08/30/2023. 8. Midline Low Back Pain: Continue current medication regime, continue HEP as tolerated. 08/30/2023. 9. Right Gluteal Medius Weakness and Atrophy: No complaints Today:Continue with Hip Abduction Exercises as tolerated: Continue to Monitor. 03/045/2025. 910 Right Hip Pain/ Right Greater Trochanteric Tenderness: No complaints today. Continue to Alternate Ice and Heat Therapy. Continue to Monitor. 08/01/2023  11. Left Sacroiliac Joint Pain:  RX: Physical Therapy referral placed.  S/P Sacroiliac Injection with good relief noted on 11/10/2022. Continue to monitor. 08/30/2023 12. Lumbar Radiculitis: No complaints today.Continue Gabapentin. Continue HEP as tolerated. Continue current medication regimen. Continue to monitor.  08/30/2023. 13. Fall at Home: Educated on falls prevention, she verbalizes understanding.   14. Left Rib Pain: RX: X-ray ordered, she verbalizes understanding. Continue to monitor.    F/U in 1 month

## 2023-09-04 ENCOUNTER — Telehealth: Payer: Self-pay | Admitting: Registered Nurse

## 2023-09-04 ENCOUNTER — Telehealth: Payer: Medicare Other | Admitting: Primary Care

## 2023-09-04 ENCOUNTER — Encounter: Payer: Self-pay | Admitting: Primary Care

## 2023-09-04 VITALS — BP 120/73 | HR 78 | Ht 64.0 in | Wt 182.0 lb

## 2023-09-04 DIAGNOSIS — I4891 Unspecified atrial fibrillation: Secondary | ICD-10-CM | POA: Diagnosis not present

## 2023-09-04 DIAGNOSIS — G4733 Obstructive sleep apnea (adult) (pediatric): Secondary | ICD-10-CM | POA: Diagnosis not present

## 2023-09-04 DIAGNOSIS — I4892 Unspecified atrial flutter: Secondary | ICD-10-CM | POA: Diagnosis not present

## 2023-09-04 LAB — DRUG TOX MONITOR 1 W/CONF, ORAL FLD

## 2023-09-04 LAB — DRUG TOX ALC METAB W/CON, ORAL FLD: Alcohol Metabolite: NEGATIVE ng/mL (ref <25)

## 2023-09-04 MED ORDER — BUDESONIDE-FORMOTEROL FUMARATE 80-4.5 MCG/ACT IN AERO: 2.0000 | INHALATION_SPRAY | Freq: Two times a day (BID) | RESPIRATORY_TRACT | 3 refills | Status: DC

## 2023-09-04 NOTE — Addendum Note (Signed)
 Addended by: Glenford Bayley on: 09/04/2023 03:53 PM   Modules accepted: Orders

## 2023-09-04 NOTE — Telephone Encounter (Signed)
 Call placed to [name] to review X-rays, no answer left message to return the call.

## 2023-09-04 NOTE — Telephone Encounter (Signed)
 Patient unable to schedule CPAP titration due to back pain, discontinued (see OV 09/04/23)

## 2023-09-04 NOTE — Progress Notes (Signed)
 Virtual Visit via Video Note  I connected with [name] on 09/04/23 at  3:00 PM EDT by a video enabled telemedicine application and verified that I am speaking with the correct person using two identifiers.  Location: Patient: Home Provider: Office    I discussed the limitations of evaluation and management by telemedicine and the availability of in person appointments. The patient expressed understanding and agreed to proceed.  History of Present Illness:  73 year old female, never smoked. PMH significant for OSA, chronic respiratory failure secondary to sleep related hypoxia, fibromyalgia, GERD, HTN, HLD, hypothyroidism, depression, migraine headaches, DM, Afib, vitamin B12 deficiency. Patient of Dr. Craige Cotta, last seen on 06/10/19 for video visit.   HST 05/28/19 >> AHI 26.9, SpO2 low 64%. Arranged for in-lab CPAP titration study to determine if she needs BIPAP +/- supplemental oxygen. Continues 2L nocturnal oxygen at this time pending titration study. Bipap 06/26/19 >> Bipap 17/13 cm H2O. She didn't need supplemental oxygen once she was on appropriate Bipap settings. DME company is [employer].     06/05/2023- Interim hx  Discussed the use of AI scribe software for clinical note transcription with the patient, who gave verbal consent to proceed.  History of Present Illness   Patient presents today for an overdue follow-up OSA on BIPAP. During last OV with Dr. Craige Cotta in December 2021. She has lost 50 lbs in the last 2 years. She discontinued BiPAP therapy approximately 16 months ago and has been sleeping well since, with the exception of frequent nocturnal awakenings due to urinary urgency. The patient typically goes to bed around 10 or 11 PM, wakes up around midnight to take medication, and then returns to sleep without difficulty. The frequency of nocturnal awakenings varies from one to six times per night. The patient usually starts her day around 7:30 or 8 AM and does not report feeling tired  upon waking or falling asleep easily during the day.  The patient has a history of snoring, but it is unclear if this is still an issue as she currently sleeps alone. She sleeps on her back due to a previous back injury and denies waking up gasping for air. The patient has had two previous sleep studies; the first resulted in a diagnosis of sleep apnea and the initiation of oxygen therapy, while the second led to the prescription of BiPAP therapy. The patient discontinued BiPAP due to discomfort and noise, which interfered with her ability to care for her spouse who was on hospice care at home.  The patient also has a history of atrial fibrillation and underwent a cardiac ablation approximately 15 months ago. However, she recently experienced an episode of atrial fibrillation following a shingles vaccine and required a cardioversion. A recent echo and stress test revealed atrial flutter, and the patient is scheduled for another cardiac ablation. The patient denies any underlying pulmonary conditions.      09/04/2023- interim hx  Discussed the use of AI scribe software for clinical note transcription with the patient, who gave verbal consent to proceed.  History of Present Illness   The patient, with a history of atrial fibrillation and atrial flutter, presents for review of sleep study results.  She has a history of sleep apnea and previously used BiPAP, which she discontinued about a year and a half prior to December after losing weight. Despite reporting good sleep quality and no daytime sleepiness, her history of atrial fibrillation and atrial flutter prompted a sleep study to evaluate for sleep apnea.  The sleep study conducted in December revealed moderate sleep apnea with an average of 22 apneic events per hour and severe oxygen desaturations during obstructive events. A titration study was recommended to assess the need for oxygen with CPAP or BiPAP, but she has not been able to return for this  study due to rib and back pain exacerbated by sleeping flat.  Since the December sleep study, she has been using a wedge pillow to sleep and monitors her oxygen levels with a watch, which has shown a lowest oxygen level of 97%. No symptoms of waking up gasping or short of breath, and she is unsure if she snores. She has tried different masks for BiPAP in the past but experienced air leaks and poor tolerance, stating it 'didn't do any good.'  She has never smoked and is not on any inhalers or Symbicort.      Observations/Objective:   Mod OSA with AHi 22/h Predom REM sleep Severe hypoxia, best for titration study to see if O2 needed with PAP      Assessment and Plan:  Assessment and Plan    Obstructive Sleep Apnea (OSA) Moderate OSA with severe oxygen desaturations noted on in-lab sleep study in December 2024. Patient has been using a wedge pillow and reports no symptoms of sleep apnea. Patient has a history of poor tolerance to BiPAP with air leaks and discomfort. She declined CPAP titration study due to back issues. -Order overnight oximetry test to assess oxygen levels during sleep with the use of the wedge pillow. -If oxygen desaturations persist, consider an oral appliance as an alternative to CPAP/BiPAP.  Atrial Fibrillation (AFib) and Atrial Flutter These conditions can be exacerbated by untreated sleep apnea. No changes in management discussed during this visit.  Back and Rib Pain Patient reports exacerbation of pain when lying flat, which has impacted ability to complete sleep studies. No specific plan discussed for this issue.  Medication Error Patient reported a potential error in medication list with Symbicort and smoking history. Doctor confirmed no history of smoking or Symbicort use in the chart. -Clarify with staff about the reported error.      Follow Up Instructions:  Follow-up after ONO results while using wedge pillow    I discussed the assessment and  treatment plan with the patient. The patient was provided an opportunity to ask questions and all were answered. The patient agreed with the plan and demonstrated an understanding of the instructions.   The patient was advised to call back or seek an in-person evaluation if the symptoms worsen or if the condition fails to improve as anticipated.  I provided 22 minutes of non-face-to-face time during this encounter.   Glenford Bayley, NP

## 2023-09-05 NOTE — Telephone Encounter (Signed)
Patient returned Eunice's phone call. 

## 2023-09-08 ENCOUNTER — Telehealth: Payer: Self-pay

## 2023-09-08 NOTE — Telephone Encounter (Signed)
 I have researched patient's chart. Symbicort was sent in by [facility] during 09/04/2023 virtual visit. I do see where that Rx was discontinued in epic, however this does not flow over to the pharmacy. We have to call the pharmacy to cancel.   Routing to [employer] as an Financial planner

## 2023-09-08 NOTE — Telephone Encounter (Signed)
 Thank you for looking into this. This was an error on my part before the visit. We addressed it during the visit and medication had already been discontinued. I apologize that the medication was sent in error. [name] will reach out to patient.

## 2023-09-08 NOTE — Telephone Encounter (Signed)
 [name] personally called and spoke to pt after seeing the message on Mychart. [name] looked through outside meds and current meds, and did not see where she rx this to pt. [name] put in the notes from the Mychart video visit on Monday 09-04-23 that the pt does not use/ take Symbicort.   I, personally, took Symbicort off of the pts med list as discontinued stated that pt does not use this.   [name] asks that you help look into this to see what happened and see where the original rx came from, so we can f/u with pt. [name] looked completely through her med list. Our office see's her for OSA, and pt is a former Hospital doctor pt.

## 2023-09-08 NOTE — Telephone Encounter (Signed)
 The pharmacy confirmed that Symbicort is discontinued on their part now, per when pt called them earlier.

## 2023-09-08 NOTE — Telephone Encounter (Signed)
 I called [facility] regarding the Mychart message issue. I spoke with a pharmacist, Jae Dire, about a Symbicort rx and the pharmacy states the last rx came from 09-04-23 through [facility]. It was electronically sent. Jae Dire states they do not see where this was discontinued, but Jae Dire stated the pt had called and told the pharmacy to discontinue this and the procedure for when the pt does not want a RX, and if it is approved, they will send the pt a box or package to ship it back to the pharmacy. Progress has been made to refund the $80 copay.   Routing to [employer] as an FYI  [phone]- [facility] phone number

## 2023-09-11 NOTE — Telephone Encounter (Signed)
 Please refer to 09/08/2023 phone note.

## 2023-09-11 NOTE — Telephone Encounter (Signed)
 Spoke to patient and discussed this issue further. Pt is aware that medication was sent in error. I apologized for the inconvenience and mixup. Pt stated that [facility] is in the process of mailing her an envelope so she can return the medication. Once medication is returned, she will receive a refund. I have asked that she keep [place] updated on this process to ensure that she gets refunded.

## 2023-09-26 NOTE — Progress Notes (Unsigned)
 Electrophysiology Clinic Note    Date:  09/27/2023  Patient ID:  [name], [name] 08-07-1950, MRN [id number] PCP:  Hannah Beat, MD  Cardiologist:  Bryan Lemma, MD Electrophysiologist: Lanier Prude, MD   Discussed the use of AI scribe software for clinical note transcription with the patient, who gave verbal consent to proceed.   Patient Profile    Chief Complaint: AF ablation follow-up  History of Present Illness: [name] is a 73 y.o. female with PMH notable for persis AFib, typical aflutter, HTN, hypothyroid, fibromyalgia, T2DM, OSA; seen today for Lanier Prude, MD for routine electrophysiology followup.   She is s/p AF ablation w PVI, posterior wall on 11/2021 She is s/p redo AF ablation w PVI, posterior wall, and CTI on 06/2023.  I saw her for routine 1 month post-ablation where she was maintaining sinus rhythm.  On follow-up today, she has not had any recent AF episodes. She denies dizziness, LH, presyncope. BP by home readings is typically 110-120s systolic, never 100 or less. She continues to take eliquis BID, no bleeding concerns.  She cotninues to follow-up with pulm regarding her OSA. She has purchase an adjustable base mattress and sleeps with HOB elevated. She has been sleeping better since. Pulm planning for overnight oximetry   AAD History: Flecainide - stopped pre-2025 ablation    ROS:  Please see the history of present illness. All other systems are reviewed and otherwise negative.    Physical Exam    VS:  BP 123/70 (BP Location: Left Arm, Patient Position: Sitting, Cuff Size: Normal)   Pulse 65   Ht 5\' 4"  (1.626 m)   Wt 184 lb 9.6 oz (83.7 kg)   SpO2 97%   BMI 31.69 kg/m  BMI: Body mass index is 31.69 kg/m.  Wt Readings from Last 3 Encounters:  09/27/23 184 lb 9.6 oz (83.7 kg)  09/04/23 182 lb (82.6 kg)  08/30/23 187 lb (84.8 kg)     GEN- The patient is well appearing, alert and oriented x 3 today.   Lungs- Clear to  ausculation bilaterally, normal work of breathing.  Heart- Regular rate and rhythm, 3/6 murmur along LSB, no rubs or gallops Extremities- Trace peripheral edema, warm, dry    Studies Reviewed   Previous EP, cardiology notes.    EKG is ordered. Personal review of EKG from today shows:    EKG Interpretation Date/Time:  Wednesday September 27 2023 11:01:56 EDT Ventricular Rate:  65 PR Interval:  136 QRS Duration:  82 QT Interval:  382 QTC Calculation: 397 R Axis:   41  Text Interpretation: Normal sinus rhythm Confirmed by Sherie Don [phone]) on 09/27/2023 11:08:45 AM     TTE, 05/16/2023  1. Left ventricular ejection fraction, by estimation, is 60 to 65%. Left ventricular ejection fraction by 3D volume is 63 %. The left ventricle has normal function. The left ventricle has no regional wall motion abnormalities. Left ventricular diastolic  parameters were normal. The average left ventricular global longitudinal strain is -18.8 %. The global longitudinal strain is normal.   2. Right ventricular systolic function is normal. The right ventricular size is mildly enlarged. There is normal pulmonary artery systolic pressure. The estimated right ventricular systolic pressure is 30.9 mmHg.   3. Left atrial size was mildly dilated.   4. The mitral valve is normal in structure. Mild to moderate mitral valve regurgitation. No evidence of mitral stenosis.   5. Tricuspid valve regurgitation is moderate.  6. The aortic valve is tricuspid. There is mild calcification of the aortic valve. Aortic valve regurgitation is mild. Aortic valve sclerosis/calcification is present, without any evidence of aortic stenosis.   7. The inferior vena cava is normal in size with greater than 50% respiratory variability, suggesting right atrial pressure of 3 mmHg.     Assessment and Plan     #) persis Afib #) Aflutter S/p AF ablation 11/2021, and redo AFib, flutter ablation 06/2023 Maintaining sinus rhythm since redo  ablation Continue 240mg  dilt daily  #) Hypercoag d/t persis afib CHA2DS2-VASc Score = at least 4 [CHF History: 0, HTN History: 1, Diabetes History: 1, Stroke History: 0, Vascular Disease History: 0, Age Score: 1, Gender Score: 1].  Therefore, the patient's annual risk of stroke is 4.8 %.    Stroke ppx - 5mg  eliquis BID, appropriately dosed No bleeding concerns  #) HTN Well-controlled today in office and by home readings Continue 240mg  dilt daily  #) OSA Continue follow-up with Pulm We discussed the link between untreated OSA and afib.      Current medicines are reviewed at length with the patient today.   The patient does not have concerns regarding her medicines.  The following changes were made today:  none  Labs/ tests ordered today include:  Orders Placed This Encounter  Procedures   EKG 12-Lead     Disposition: Follow up with Dr. Lalla Brothers or EP APP in 12 months   Signed, Sherie Don, NP  09/27/23  1:09 PM  Electrophysiology [facility]

## 2023-09-27 ENCOUNTER — Ambulatory Visit: Payer: Medicare Other | Attending: Cardiology | Admitting: Cardiology

## 2023-09-27 ENCOUNTER — Encounter: Attending: Physical Medicine & Rehabilitation | Admitting: Registered Nurse

## 2023-09-27 VITALS — BP 123/70 | HR 65 | Ht 64.0 in | Wt 184.6 lb

## 2023-09-27 VITALS — BP 126/72 | HR 64 | Ht 64.0 in | Wt 185.0 lb

## 2023-09-27 DIAGNOSIS — G4733 Obstructive sleep apnea (adult) (pediatric): Secondary | ICD-10-CM

## 2023-09-27 DIAGNOSIS — I4819 Other persistent atrial fibrillation: Secondary | ICD-10-CM

## 2023-09-27 DIAGNOSIS — I483 Typical atrial flutter: Secondary | ICD-10-CM | POA: Diagnosis not present

## 2023-09-27 DIAGNOSIS — Z5181 Encounter for therapeutic drug level monitoring: Secondary | ICD-10-CM | POA: Insufficient documentation

## 2023-09-27 DIAGNOSIS — M546 Pain in thoracic spine: Secondary | ICD-10-CM | POA: Diagnosis present

## 2023-09-27 DIAGNOSIS — Z79891 Long term (current) use of opiate analgesic: Secondary | ICD-10-CM | POA: Diagnosis present

## 2023-09-27 DIAGNOSIS — G894 Chronic pain syndrome: Secondary | ICD-10-CM | POA: Insufficient documentation

## 2023-09-27 DIAGNOSIS — G8929 Other chronic pain: Secondary | ICD-10-CM | POA: Insufficient documentation

## 2023-09-27 DIAGNOSIS — M170 Bilateral primary osteoarthritis of knee: Secondary | ICD-10-CM | POA: Diagnosis present

## 2023-09-27 DIAGNOSIS — D6869 Other thrombophilia: Secondary | ICD-10-CM | POA: Diagnosis not present

## 2023-09-27 MED ORDER — MORPHINE SULFATE ER 15 MG PO TBCR: 15.0000 mg | EXTENDED_RELEASE_TABLET | Freq: Three times a day (TID) | ORAL | 0 refills | Status: DC

## 2023-09-27 NOTE — Patient Instructions (Signed)
 Medication Instructions:  The current medical regimen is effective;  continue present plan and medications as directed. Please refer to the Current Medication list given to you today.   *If you need a refill on your cardiac medications before your next appointment, please call your pharmacy*  Follow-Up: At [facility], you and your health needs are our priority.  As part of our continuing mission to provide you with exceptional heart care, our providers are all part of one team.  This team includes your primary Cardiologist (physician) and Advanced Practice Providers or APPs (Physician Assistants and Nurse Practitioners) who all work together to provide you with the care you need, when you need it.  Your next appointment:   12 month(s)  Provider:   Steffanie Dunn, MD or Sherie Don, NP   DUE FOR FOLLOW UP WITH Dr.HARDING in [place] (please schedule)     We recommend signing up for the patient portal called "MyChart".  Sign up information is provided on this After Visit Summary.  MyChart is used to connect with patients for Virtual Visits (Telemedicine).  Patients are able to view lab/test results, encounter notes, upcoming appointments, etc.  Non-urgent messages can be sent to your provider as well.   To learn more about what you can do with MyChart, go to [email or web address].

## 2023-09-27 NOTE — Progress Notes (Unsigned)
 Subjective:    Patient ID: [name], female    DOB: 1950-10-31, 73 y.o.   MRN: [id number]  HPI: [name] is a 73 y.o. female who returns for follow up appointment for chronic pain and medication refill. states *** pain is located in  ***. rates pain ***. current exercise regime is walking and performing stretching exercises.  [name] equivalent is *** MME.   Last Oral Swab 08/30/2023, [name] reports she is compliant with her medications.     Pain Inventory Average Pain 5 Pain Right Now 5 My pain is constant, burning, dull, and aching  In the last 24 hours, has pain interfered with the following? General activity 4 Relation with others 4 Enjoyment of life 4 What TIME of day is your pain at its worst? evening and night Sleep (in general) Good  Pain is worse with: walking, bending, standing, and some activites Pain improves with: rest, heat/ice, and medication Relief from Meds: 4  Family History  Problem Relation Age of Onset   Breast cancer Mother    Diabetes Mother    Heart disease Mother    Allergies Mother    Diabetes Father    Heart disease Father    Emphysema Father    Allergies Father    Breast cancer Paternal Aunt    Colon cancer Neg Hx    Esophageal cancer Neg Hx    Rectal cancer Neg Hx    Stomach cancer Neg Hx    Social History   Socioeconomic History   Marital status: Widowed    Spouse name: Not on file   Number of children: Not on file   Years of education: Not on file   Highest education level: Not on file  Occupational History   Occupation: Catering manager   Occupation: retired  Tobacco Use   Smoking status: Never    Passive exposure: Past   Smokeless tobacco: Never   Tobacco comments:    Never smoke 01/13/22  Vaping Use   Vaping status: Never Used  Substance and Sexual Activity   Alcohol use: Yes    Alcohol/week: 1.0 standard drink of alcohol    Types: 1 Glasses of wine per week    Comment: rare   Drug use: Never    Sexual activity: Not Currently  Other Topics Concern   Not on file  Social History Narrative   Not on file   Social Drivers of Health   Financial Resource Strain: Low Risk  (01/19/2023)   Overall Financial Resource Strain (CARDIA)    Difficulty of Paying Living Expenses: Not hard at all  Food Insecurity: No Food Insecurity (01/19/2023)   Hunger Vital Sign    Worried About Running Out of Food in the Last Year: Never true    Ran Out of Food in the Last Year: Never true  Transportation Needs: No Transportation Needs (01/19/2023)   PRAPARE - Administrator, Civil Service (Medical): No    Lack of Transportation (Non-Medical): No  Physical Activity: Insufficiently Active (01/19/2023)   Exercise Vital Sign    Days of Exercise per Week: 7 days    Minutes of Exercise per Session: 20 min  Stress: No Stress Concern Present (01/19/2023)   [employer] of Occupational Health - Occupational Stress Questionnaire    Feeling of Stress : Not at all  Social Connections: Moderately Integrated (01/19/2023)   Social Connection and Isolation Panel [NHANES]    Frequency of Communication with Friends and Family:  More than three times a week    Frequency of Social Gatherings with Friends and Family: More than three times a week    Attends Religious Services: More than 4 times per year    Active Member of Clubs or Organizations: Yes    Attends Banker Meetings: More than 4 times per year    Marital Status: Widowed   Past Surgical History:  Procedure Laterality Date   6 HOUR PH STUDY N/A 01/04/2016   Procedure: 24 HOUR PH STUDY;  Surgeon: Ruffin Frederick, MD;  Location: [facility];  Service: Gastroenterology;  Laterality: N/A;   ABDOMINAL HYSTERECTOMY  1988   ADENOIDECTOMY     ANTERIOR CERVICAL DECOMP/DISCECTOMY FUSION  2009, 1993   first procedure in ~ 1993/Dr Botero   APPENDECTOMY  1988   ATRIAL FIBRILLATION ABLATION N/A 12/16/2021   Procedure: ATRIAL FIBRILLATION  ABLATION;  Surgeon: Lanier Prude, MD;  Location: [facility];  Service: Cardiovascular;  Laterality: N/A;   ATRIAL FIBRILLATION ABLATION N/A 06/29/2023   Procedure: ATRIAL FIBRILLATION ABLATION;  Surgeon: Lanier Prude, MD;  Location: [facility];  Service: Cardiovascular;  Laterality: N/A;   BACK SURGERY     CARDIOVERSION N/A 04/03/2019   Procedure: CARDIOVERSION;  Surgeon: Lars Masson, MD;  Location: [facility] ENDOSCOPY;  Service: Cardiovascular;  Laterality: N/A;   CHOLECYSTECTOMY  2005   ESOPHAGEAL MANOMETRY N/A 01/04/2016   Procedure: ESOPHAGEAL MANOMETRY (EM) 24 HR PH;  Surgeon: Ruffin Frederick, MD;  Location: [facility];  Service: Gastroenterology;  Laterality: N/A;   ESOPHAGOGASTRODUODENOSCOPY (EGD) WITH PROPOFOL N/A 05/17/2016   Procedure: ESOPHAGOGASTRODUODENOSCOPY (EGD) WITH PROPOFOL;  Surgeon: Ruffin Frederick, MD;  Location: [facility];  Service: Gastroenterology;  Laterality: N/A;   FOOT SURGERY Left 08/2022   NASAL SINUS SURGERY  2006   SAVORY DILATION N/A 05/17/2016   Procedure: SAVORY DILATION;  Surgeon: Ruffin Frederick, MD;  Location: [facility];  Service: Gastroenterology;  Laterality: N/A;   TEE WITHOUT CARDIOVERSION N/A 04/03/2019   Procedure: TRANSESOPHAGEAL ECHOCARDIOGRAM (TEE)-with DCCV;  Surgeon: Lars Masson, MD;  Location[facility] ENDOSCOPY;; EF 55 to 60%.  No LVH.  Normal RV.  Moderate LA dilation.  No thrombus.  Mild RA dilation.  Mild to moderate TR.  Mild aortic valve sclerosis, no stenosis.  Moderately elevated PA pressures (47 mmHg)   TONSILLECTOMY AND ADENOIDECTOMY     TRANSTHORACIC ECHOCARDIOGRAM  2009   Normal LV size and function. EF 60-65%. No regional wall motion and amount is. Mild aortic sclerosis, no stenosis   TUBAL LIGATION  1979   Past Surgical History:  Procedure Laterality Date   96 HOUR PH STUDY N/A 01/04/2016   Procedure: 24 HOUR PH STUDY;  Surgeon: Ruffin Frederick, MD;  Location: [facility]  ENDOS[facility];  Service: Gastroenterology;  Laterality: N/A;   ABDOMINAL HYSTERECTOMY  1988   ADENOIDECTOMY     ANTERIOR CERVICAL DECOMP/DISCECTOMY FUSION  2009, 1993   first procedure in ~ 1993/Dr Botero   APPENDECTOMY  1988   ATRIAL FIBRILLATION ABLATION N/A 12/16/2021   Procedure: ATRIAL FIBRILLATION ABLATION;  Surgeon: Lanier Prude, MD;  Location: [facility];  Service: Cardiovascular;  Laterality: N/A;   ATRIAL FIBRILLATION ABLATION N/A 06/29/2023   Procedure: ATRIAL FIBRILLATION ABLATION;  Surgeon: Lanier Prude, MD;  Location: [facility];  Service: Cardiovascular;  Laterality: N/A;   BACK SURGERY     CARDIOVERSION N/A 04/03/2019   Procedure: CARDIOVERSION;  Surgeon: Lars Masson, MD;  Location: [facility] END[facility];  Service: Cardiovascular;  Laterality: N/A;   CHOLECYSTECTOMY  2005   ESOPHAGEAL MANOMETRY N/A 01/04/2016   Procedure: ESOPHAGEAL MANOMETRY (EM) 24 HR PH;  Surgeon: Ruffin Frederick, MD;  Location: [facility];  Service: Gastroenterology;  Laterality: N/A;   ESOPHAGOGASTRODUODENOSCOPY (EGD) WITH PROPOFOL N/A 05/17/2016   Procedure: ESOPHAGOGASTRODUODENOSCOPY (EGD) WITH PROPOFOL;  Surgeon: Ruffin Frederick, MD;  Location: [facility];  Service: Gastroenterology;  Laterality: N/A;   FOOT SURGERY Left 08/2022   NASAL SINUS SURGERY  2006   SAVORY DILATION N/A 05/17/2016   Procedure: SAVORY DILATION;  Surgeon: Ruffin Frederick, MD;  Location: [facility] END[facility];  Service: Gastroenterology;  Laterality: N/A;   TEE WITHOUT CARDIOVERSION N/A 04/03/2019   Procedure: TRANSESOPHAGEAL ECHOCARDIOGRAM (TEE)-with DCCV;  Surgeon: Lars Masson, MD;  Location: [facility] ENDOSCOPY;; EF 55 to 60%.  No LVH.  Normal RV.  Moderate LA dilation.  No thrombus.  Mild RA dilation.  Mild to moderate TR.  Mild aortic valve sclerosis, no stenosis.  Moderately elevated PA pressures (47 mmHg)   TONSILLECTOMY AND ADENOIDECTOMY     TRANSTHORACIC ECHOCARDIOGRAM  2009   Normal  LV size and function. EF 60-65%. No regional wall motion and amount is. Mild aortic sclerosis, no stenosis   TUBAL LIGATION  1979   Past Medical History:  Diagnosis Date   B12 deficiency    Complication of anesthesia    slow to wake up    Depression    Diabetes mellitus type 2, diet-controlled (HCC) 07/24/2018   Fibromyalgia    Generalized hyperhidrosis 04/17/2014   GERD (gastroesophageal reflux disease)    History of rheumatic fever as a child    Hyperlipemia    Hypertension    denies htn on 05/09/16   HYPOTHYROIDISM 05/29/2007   Irritable bowel syndrome    Migraines    hx of    OSA (obstructive sleep apnea) 06/26/2019   Senile calcific aortic valve sclerosis    Aortic Sclerosis-no stenosis   BP 126/72   Pulse 64   Ht 5\' 4"  (1.626 m)   Wt 185 lb (83.9 kg)   SpO2 98%   BMI 31.76 kg/m   Opioid Risk Score:   Fall Risk Score:  `1  Depression screen [facility] 2/9     08/30/2023   11:34 AM 08/22/2023    1:03 PM 08/01/2023   11:21 AM 06/30/2023   11:10 AM 05/31/2023   11:32 AM 05/03/2023   11:19 AM 03/30/2023   10:15 AM  Depression screen PHQ 2/9  Decreased Interest 0 0 0 0 0 0 0  Down, Depressed, Hopeless 0 0 0 0 0 0 0  PHQ - 2 Score 0 0 0 0 0 0 0     Review of Systems  Musculoskeletal:  Positive for back pain.       Bilateral knee pain buttocks  All other systems reviewed and are negative.     Objective:   Physical Exam        Assessment & Plan:   Cervical postlaminectomy syndrome:/ Cervicalgia/ Cervical Radiculitis/ with neck and shoulder pain/ Chronic Midline Thoracic Pain.  Continue exercise routine and using heat therapy. 08/01/2023 Continue : current medication regimen. Refilled: MS Contin 15 mg one tablet three times a day #90.   We will continue the opioid monitoring program, this consists of regular clinic visits, examinations, urine drug screen, pill counts as well as use of [place] Controlled Substance Reporting system. A 12 month History has been  reviewed on  the [place] Controlled Substance Reporting System on 08/01/2023.   2. Fibromyalgia/ Neuropathy: Continue current medication regimen with Gabapentin, activity and exercise regime. 08/01/2023. 3. Left Knee Pain/ Mild Degenerative Changes S/P : on 12/06/2022 with Dr Wynn Banker.   LEFT Genicular nerve block x 3, Upper medial, Upper lateral , and Lower Medial under fluoroscopic guidance    On 02/09/2023: S/P LEFT KNEE Genicular nerve Radiofrequency neurotomy x 3 nerves, Upper medial, Upper lateral , and Lower Medial under fluoroscopic guidance . Continue current medication regimen with Voltaren Gel. 08/01/2023. 4. Bilateral  Ankle Pain:  No complaints today. Continue HEP as tolerated. Continue to monitor.08/01/2023. 5. Muscle Spasm: Continue current medication regimen with  Robaxin. Continue to Monitor.08/01/2023.. 6. Opioid Induced Constipation: No complaints Continue to Monitor. 08/01/2023. 7. Midline Low Back Pain: Continue current medication regime, continue HEP as tolerated. 08/01/2023. 8. Right Gluteal Medius Weakness and Atrophy: No complaints Today:Continue with Hip Abduction Exercises as tolerated: Continue to Monitor. 08/01/2023. 9. Right Hip Pain/ Right Greater Trochanteric Tenderness: No complaints today. Continue to Alternate Ice and Heat Therapy. Continue to Monitor. 08/01/2023  10. Sacroiliac Joint Pain:  No complaints today. S/P Sacroiliac Injection with good relief noted on 11/10/2022. Continue to monitor. 08/01/2023 11. Lumbar Radiculitis: No complaints today.Continue Gabapentin. Continue HEP as tolerated. Continue current medication regimen. Continue to monitor.  08/01/2023.     F/U in 1 month

## 2023-09-28 ENCOUNTER — Encounter: Payer: Self-pay | Admitting: Registered Nurse

## 2023-10-07 ENCOUNTER — Other Ambulatory Visit: Payer: Self-pay | Admitting: Physical Medicine & Rehabilitation

## 2023-10-17 ENCOUNTER — Telehealth: Payer: Self-pay | Admitting: Primary Care

## 2023-10-17 NOTE — Telephone Encounter (Signed)
 Overnight oximetry test on room air with wedge pillow on 10/12/2023 showed SpO2 low 85% with baseline oxygen  93%.  Patient spent 4 minutes 20 seconds with SpO2 less than 88%.  Patient does not need to use oxygen  at night. She did not tolerate BIPAP. Continue to sleep with wedge pillow.

## 2023-10-17 NOTE — Telephone Encounter (Signed)
 Spoke with [name]. Notified pt of [name]'s note. Pt verbalized understanding & had no concerns. NFN

## 2023-10-24 ENCOUNTER — Encounter: Payer: Self-pay | Admitting: Primary Care

## 2023-10-26 NOTE — Progress Notes (Signed)
 Subjective:    Patient ID: [name], female    DOB: 25-Nov-1950, 73 y.o.   MRN: [id number]  HPI: [name] is a 73 y.o. female who returns for follow up appointment for chronic pain and medication refill. She states her pain is located in her mid- back, right hip and  bilateral knee pain . Ms. Blakey denies falling, X-ray ordered and she verbalizes understanding. She rates her pain 4. Her current exercise regime is walking and performing stretching exercises.  Ms. Washa Morphine  equivalent is 45.00 MME.   Last Oral Swab was )08/30/2023, Ms. Willig states she is compliant with her medication.     Pain Inventory Average Pain 4 Pain Right Now 4 My pain is constant, burning, and aching  In the last 24 hours, has pain interfered with the following? General activity 4 Relation with others 4 Enjoyment of life 4 What TIME of day is your pain at its worst? evening and night Sleep (in general) Good  Pain is worse with: walking, bending, standing, and some activites Pain improves with: rest, heat/ice, medication, and injections Relief from Meds: 4  Family History  Problem Relation Age of Onset   Breast cancer Mother    Diabetes Mother    Heart disease Mother    Allergies Mother    Diabetes Father    Heart disease Father    Emphysema Father    Allergies Father    Breast cancer Paternal Aunt    Colon cancer Neg Hx    Esophageal cancer Neg Hx    Rectal cancer Neg Hx    Stomach cancer Neg Hx    Social History   Socioeconomic History   Marital status: Widowed    Spouse name: Not on file   Number of children: Not on file   Years of education: Not on file   Highest education level: Not on file  Occupational History   Occupation: Catering manager   Occupation: retired  Tobacco Use   Smoking status: Never    Passive exposure: Past   Smokeless tobacco: Never   Tobacco comments:    Never smoke 01/13/22  Vaping Use   Vaping status: Never Used  Substance and Sexual Activity    Alcohol  use: Yes    Alcohol /week: 1.0 standard drink of alcohol     Types: 1 Glasses of wine per week    Comment: rare   Drug use: Never   Sexual activity: Not Currently  Other Topics Concern   Not on file  Social History Narrative   Not on file   Social Drivers of Health   Financial Resource Strain: Low Risk  (01/19/2023)   Overall Financial Resource Strain (CARDIA)    Difficulty of Paying Living Expenses: Not hard at all  Food Insecurity: No Food Insecurity (01/19/2023)   Hunger Vital Sign    Worried About Running Out of Food in the Last Year: Never true    Ran Out of Food in the Last Year: Never true  Transportation Needs: No Transportation Needs (01/19/2023)   PRAPARE - Administrator, Civil Service (Medical): No    Lack of Transportation (Non-Medical): No  Physical Activity: Insufficiently Active (01/19/2023)   Exercise Vital Sign    Days of Exercise per Week: 7 days    Minutes of Exercise per Session: 20 min  Stress: No Stress Concern Present (01/19/2023)   [employer] of Occupational Health - Occupational Stress Questionnaire    Feeling of Stress : Not at all  Social Connections: Moderately Integrated (01/19/2023)   Social Connection and Isolation Panel [NHANES]    Frequency of Communication with Friends and Family: More than three times a week    Frequency of Social Gatherings with Friends and Family: More than three times a week    Attends Religious Services: More than 4 times per year    Active Member of [employer] or Organizations: Yes    Attends Banker Meetings: More than 4 times per year    Marital Status: Widowed   Past Surgical History:  Procedure Laterality Date   16 HOUR PH STUDY N/A 01/04/2016   Procedure: 24 HOUR PH STUDY;  Surgeon: Danette Duos, MD;  Location: [facility];  Service: Gastroenterology;  Laterality: N/A;   ABDOMINAL HYSTERECTOMY  1988   ADENOIDECTOMY     ANTERIOR CERVICAL DECOMP/DISCECTOMY FUSION  2009,  1993   first procedure in ~ 1993/Dr Botero   APPENDECTOMY  1988   ATRIAL FIBRILLATION ABLATION N/A 12/16/2021   Procedure: ATRIAL FIBRILLATION ABLATION;  Surgeon: Boyce Byes, MD;  Location: [facility];  Service: Cardiovascular;  Laterality: N/A;   ATRIAL FIBRILLATION ABLATION N/A 06/29/2023   Procedure: ATRIAL FIBRILLATION ABLATION;  Surgeon: Boyce Byes, MD;  Location: [facility];  Service: Cardiovascular;  Laterality: N/A;   BACK SURGERY     CARDIOVERSION N/A 04/03/2019   Procedure: CARDIOVERSION;  Surgeon: Liza Riggers, MD;  Location: [facility];  Service: Cardiovascular;  Laterality: N/A;   CHOLECYSTECTOMY  2005   ESOPHAGEAL MANOMETRY N/A 01/04/2016   Procedure: ESOPHAGEAL MANOMETRY (EM) 24 HR PH;  Surgeon: Danette Duos, MD;  Location: [facility];  Service: Gastroenterology;  Laterality: N/A;   ESOPHAGOGASTRODUODENOSCOPY (EGD) WITH PROPOFOL  N/A 05/17/2016   Procedure: ESOPHAGOGASTRODUODENOSCOPY (EGD) WITH PROPOFOL ;  Surgeon: Danette Duos, MD;  Location: [facility];  Service: Gastroenterology;  Laterality: N/A;   FOOT SURGERY Left 08/2022   NASAL SINUS SURGERY  2006   SAVORY DILATION N/A 05/17/2016   Procedure: SAVORY DILATION;  Surgeon: Danette Duos, MD;  Location: [facility];  Service: Gastroenterology;  Laterality: N/A;   TEE WITHOUT CARDIOVERSION N/A 04/03/2019   Procedure: TRANSESOPHAGEAL ECHOCARDIOGRAM (TEE)-with DCCV;  Surgeon: Liza Riggers, MD;  Location: [facility] ENDOSCOPY;; EF 55 to 60%.  No LVH.  Normal RV.  Moderate LA dilation.  No thrombus.  Mild RA dilation.  Mild to moderate TR.  Mild aortic valve sclerosis, no stenosis.  Moderately elevated PA pressures (47 mmHg)   TONSILLECTOMY AND ADENOIDECTOMY     TRANSTHORACIC ECHOCARDIOGRAM  2009   Normal LV size and function. EF 60-65%. No regional wall motion and amount is. Mild aortic sclerosis, no stenosis   TUBAL LIGATION  1979   Past Surgical History:   Procedure Laterality Date   86 HOUR PH STUDY N/A 01/04/2016   Procedure: 24 HOUR PH STUDY;  Surgeon: Danette Duos, MD;  Location: [facility];  Service: Gastroenterology;  Laterality: N/A;   ABDOMINAL HYSTERECTOMY  1988   ADENOIDECTOMY     ANTERIOR CERVICAL DECOMP/DISCECTOMY FUSION  2009, 1993   first procedure in ~ 1993/Dr Botero   APPENDECTOMY  1988   ATRIAL FIBRILLATION ABLATION N/A 12/16/2021   Procedure: ATRIAL FIBRILLATION ABLATION;  Surgeon: Boyce Byes, MD;  Location: [facility];  Service: Cardiovascular;  Laterality: N/A;   ATRIAL FIBRILLATION ABLATION N/A 06/29/2023   Procedure: ATRIAL FIBRILLATION ABLATION;  Surgeon: Boyce Byes, MD;  Location: [facility];  Service: Cardiovascular;  Laterality: N/A;   BACK SURGERY     CARDIOVERSION N/A 04/03/2019   Procedure: CARDIOVERSION;  Surgeon: Liza Riggers, MD;  Location: [facility] ENDOSCOPY;  Service: Cardiovascular;  Laterality: N/A;   CHOLECYSTECTOMY  2005   ESOPHAGEAL MANOMETRY N/A 01/04/2016   Procedure: ESOPHAGEAL MANOMETRY (EM) 24 HR PH;  Surgeon: Danette Duos, MD;  Location: [facility];  Service: Gastroenterology;  Laterality: N/A;   ESOPHAGOGASTRODUODENOSCOPY (EGD) WITH PROPOFOL  N/A 05/17/2016   Procedure: ESOPHAGOGASTRODUODENOSCOPY (EGD) WITH PROPOFOL ;  Surgeon: Danette Duos, MD;  Location: [facility];  Service: Gastroenterology;  Laterality: N/A;   FOOT SURGERY Left 08/2022   NASAL SINUS SURGERY  2006   SAVORY DILATION N/A 05/17/2016   Procedure: SAVORY DILATION;  Surgeon: Danette Duos, MD;  Location: [facility];  Service: Gastroenterology;  Laterality: N/A;   TEE WITHOUT CARDIOVERSION N/A 04/03/2019   Procedure: TRANSESOPHAGEAL ECHOCARDIOGRAM (TEE)-with DCCV;  Surgeon: Liza Riggers, MD;  Location: [facility] ENDOSCOPY;; EF 55 to 60%.  No LVH.  Normal RV.  Moderate LA dilation.  No thrombus.  Mild RA dilation.  Mild to moderate TR.  Mild aortic valve  sclerosis, no stenosis.  Moderately elevated PA pressures (47 mmHg)   TONSILLECTOMY AND ADENOIDECTOMY     TRANSTHORACIC ECHOCARDIOGRAM  2009   Normal LV size and function. EF 60-65%. No regional wall motion and amount is. Mild aortic sclerosis, no stenosis   TUBAL LIGATION  1979   Past Medical History:  Diagnosis Date   B12 deficiency    Complication of anesthesia    slow to wake up    Depression    Diabetes mellitus type 2, diet-controlled (HCC) 07/24/2018   Fibromyalgia    Generalized hyperhidrosis 04/17/2014   GERD (gastroesophageal reflux disease)    History of rheumatic fever as a child    Hyperlipemia    Hypertension    denies htn on 05/09/16   HYPOTHYROIDISM 05/29/2007   Irritable bowel syndrome    Migraines    hx of    OSA (obstructive sleep apnea) 06/26/2019   Senile calcific aortic valve sclerosis    Aortic Sclerosis-no stenosis   BP 119/68   Pulse 60   Resp 16   Ht 5\' 4"  (1.626 m)   Wt 186 lb 12.8 oz (84.7 kg)   SpO2 100%   BMI 32.06 kg/m   Opioid Risk Score:   Fall Risk Score:  `1  Depression screen [facility] 2/9     08/30/2023   11:34 AM 08/22/2023    1:03 PM 08/01/2023   11:21 AM 06/30/2023   11:10 AM 05/31/2023   11:32 AM 05/03/2023   11:19 AM 03/30/2023   10:15 AM  Depression screen PHQ 2/9  Decreased Interest 0 0 0 0 0 0 0  Down, Depressed, Hopeless 0 0 0 0 0 0 0  PHQ - 2 Score 0 0 0 0 0 0 0    Review of Systems  Musculoskeletal:        Upper/mid back Rip hip down to thigh        Objective:   Physical Exam Vitals and nursing note reviewed.  Constitutional:      Appearance: Normal appearance.  Cardiovascular:     Rate and Rhythm: Normal rate and regular rhythm.     Pulses: Normal pulses.     Heart sounds: Normal heart sounds.  Pulmonary:     Effort: Pulmonary effort is normal.     Breath sounds: Normal breath sounds.  Musculoskeletal:  Comments: Normal Muscle Bulk and Muscle Testing Reveals:  Upper Extremities: Full ROM and Muscle Strength  5/5 Thoracic Paraspinal Tenderness: T-7-T-10  Lower Extremities: Full ROM and Muscle Strength 5/5 Arises from Chair with ease Narrow Based  Gait     Skin:    General: Skin is warm and dry.  Neurological:     Mental Status: She is alert and oriented to person, place, and time.  Psychiatric:        Mood and Affect: Mood normal.        Behavior: Behavior normal.         Assessment & Plan:  Cervical postlaminectomy syndrome:/ Cervicalgia/ Cervical Radiculitis/ with neck and shoulder pain/ Chronic Midline Thoracic Pain.  Continue exercise routine and using heat therapy. 10/27/2023. Continue : current medication regimen. Refilled: MS Contin  15 mg one tablet three times a day #90.   We will continue the opioid monitoring program, this consists of regular clinic visits, examinations, urine drug screen, pill counts as well as use of [place]  Controlled Substance Reporting system. A 12 month History has been reviewed on the [place]  Controlled Substance Reporting System on 10/27/2023.   2. Fibromyalgia/ Neuropathy: Continue current medication regimen with Gabapentin , activity and exercise regime. 10/27/2023. 3. Left Knee Pain/ Mild Degenerative Changes S/P : on 12/06/2022 with Dr Sharl Davies.   LEFT Genicular nerve block x 3, Upper medial, Upper lateral , and Lower Medial under fluoroscopic guidance    On 02/09/2023: S/P LEFT KNEE Genicular nerve Radiofrequency neurotomy x 3 nerves, Upper medial, Upper lateral , and Lower Medial under fluoroscopic guidance . Continue current medication regimen with Voltaren  Gel. 10/27/2023. 4. Bilateral  Ankle Pain:  No complaints today. Continue HEP as tolerated. Continue to monitor.10/27/2023. 5. Muscle Spasm: Continue current medication regimen with  Robaxin . Continue to Monitor.10/27/2023.. 6. Opioid Induced Constipation: No complaints Continue to Monitor. 10/27/2023. 7. Midline Low Back Pain: Continue current medication regime, continue HEP as  tolerated. 10/27/2023. 8. Right Gluteal Medius Weakness and Atrophy: No complaints Today:Continue with Hip Abduction Exercises as tolerated: Continue to Monitor. 10/27/2023. 9. Right Hip Pain/ Right Greater Trochanteric Tenderness: Denies Falling. RX: X-ray>  Continue to [employer] and Heat Therapy. Continue to Monitor. 10/27/2023  10. Sacroiliac Joint Pain:  No complaints today. S/P Sacroiliac Injection with good relief noted on 11/10/2022. Continue to monitor. 10/27/2023 11. Lumbar Radiculitis: No complaints today.Continue Gabapentin . Continue HEP as tolerated. Continue current medication regimen. Continue to monitor.  10/27/2023.     F/U in 1 month

## 2023-10-27 ENCOUNTER — Encounter: Attending: Physical Medicine & Rehabilitation | Admitting: Registered Nurse

## 2023-10-27 VITALS — BP 119/68 | HR 60 | Resp 16 | Ht 64.0 in | Wt 186.8 lb

## 2023-10-27 DIAGNOSIS — G8929 Other chronic pain: Secondary | ICD-10-CM | POA: Insufficient documentation

## 2023-10-27 DIAGNOSIS — Z79891 Long term (current) use of opiate analgesic: Secondary | ICD-10-CM | POA: Insufficient documentation

## 2023-10-27 DIAGNOSIS — M25551 Pain in right hip: Secondary | ICD-10-CM | POA: Insufficient documentation

## 2023-10-27 DIAGNOSIS — M170 Bilateral primary osteoarthritis of knee: Secondary | ICD-10-CM | POA: Diagnosis not present

## 2023-10-27 DIAGNOSIS — Z5181 Encounter for therapeutic drug level monitoring: Secondary | ICD-10-CM | POA: Insufficient documentation

## 2023-10-27 DIAGNOSIS — M546 Pain in thoracic spine: Secondary | ICD-10-CM | POA: Diagnosis not present

## 2023-10-27 DIAGNOSIS — G894 Chronic pain syndrome: Secondary | ICD-10-CM | POA: Diagnosis not present

## 2023-10-27 MED ORDER — MORPHINE SULFATE ER 15 MG PO TBCR: 15.0000 mg | EXTENDED_RELEASE_TABLET | Freq: Three times a day (TID) | ORAL | 0 refills | Status: DC

## 2023-10-29 ENCOUNTER — Other Ambulatory Visit: Payer: Self-pay | Admitting: Family Medicine

## 2023-11-03 ENCOUNTER — Encounter: Payer: Self-pay | Admitting: Registered Nurse

## 2023-11-04 ENCOUNTER — Other Ambulatory Visit: Payer: Self-pay | Admitting: Family Medicine

## 2023-11-18 ENCOUNTER — Other Ambulatory Visit: Payer: Self-pay | Admitting: Family Medicine

## 2023-11-21 NOTE — Telephone Encounter (Signed)
 Called  pt and schedule a appt for cpe

## 2023-11-21 NOTE — Telephone Encounter (Signed)
 Please schedule CPE with fasting labs prior with Dr. Patsy Lager.

## 2023-11-28 ENCOUNTER — Encounter: Attending: Physical Medicine & Rehabilitation | Admitting: Registered Nurse

## 2023-11-28 ENCOUNTER — Encounter: Payer: Self-pay | Admitting: Registered Nurse

## 2023-11-28 VITALS — BP 124/72 | HR 65 | Ht 64.0 in | Wt 183.0 lb

## 2023-11-28 DIAGNOSIS — Z5181 Encounter for therapeutic drug level monitoring: Secondary | ICD-10-CM | POA: Diagnosis present

## 2023-11-28 DIAGNOSIS — M170 Bilateral primary osteoarthritis of knee: Secondary | ICD-10-CM | POA: Diagnosis not present

## 2023-11-28 DIAGNOSIS — G8929 Other chronic pain: Secondary | ICD-10-CM | POA: Diagnosis present

## 2023-11-28 DIAGNOSIS — G894 Chronic pain syndrome: Secondary | ICD-10-CM | POA: Diagnosis not present

## 2023-11-28 DIAGNOSIS — Z79891 Long term (current) use of opiate analgesic: Secondary | ICD-10-CM | POA: Insufficient documentation

## 2023-11-28 DIAGNOSIS — M546 Pain in thoracic spine: Secondary | ICD-10-CM | POA: Diagnosis present

## 2023-11-28 MED ORDER — MORPHINE SULFATE ER 15 MG PO TBCR: 15.0000 mg | EXTENDED_RELEASE_TABLET | Freq: Three times a day (TID) | ORAL | 0 refills | Status: DC

## 2023-11-28 NOTE — Progress Notes (Signed)
 Subjective:    Patient ID: [name], female    DOB: 03-Oct-1950, 73 y.o.   MRN: [id number]  HPI: [name] is a 73 y.o. female who returns for follow up appointment for chronic pain and medication refill. She states her pain is located in her mid- lower back and bilateral knees L>R. She  rates her pain 4. Her current exercise regime is walking and performing stretching exercises.  [name] Morphine  equivalent is 45.00 MME.   Last Oral Swa was Performed on 08/30/2023, see not for details . [name] reports she is compliant with her medication.     Pain Inventory Average Pain 4 Pain Right Now 4 My pain is constant, burning, and aching  In the last 24 hours, has pain interfered with the following? General activity 4 Relation with others 4 Enjoyment of life 4 What TIME of day is your pain at its worst? evening and night Sleep (in general) Good  Pain is worse with: walking, bending, standing, and some activites Pain improves with: rest, heat/ice, and medication Relief from Meds: 5  Family History  Problem Relation Age of Onset   Breast cancer Mother    Diabetes Mother    Heart disease Mother    Allergies Mother    Diabetes Father    Heart disease Father    Emphysema Father    Allergies Father    Breast cancer Paternal Aunt    Colon cancer Neg Hx    Esophageal cancer Neg Hx    Rectal cancer Neg Hx    Stomach cancer Neg Hx    Social History   Socioeconomic History   Marital status: Widowed    Spouse name: Not on file   Number of children: Not on file   Years of education: Not on file   Highest education level: Not on file  Occupational History   Occupation: Catering manager   Occupation: retired  Tobacco Use   Smoking status: Never    Passive exposure: Past   Smokeless tobacco: Never   Tobacco comments:    Never smoke 01/13/22  Vaping Use   Vaping status: Never Used  Substance and Sexual Activity   Alcohol  use: Yes    Alcohol /week: 1.0 standard drink of  alcohol     Types: 1 Glasses of wine per week    Comment: rare   Drug use: Never   Sexual activity: Not Currently  Other Topics Concern   Not on file  Social History Narrative   Not on file   Social Drivers of Health   Financial Resource Strain: Low Risk  (01/19/2023)   Overall Financial Resource Strain (CARDIA)    Difficulty of Paying Living Expenses: Not hard at all  Food Insecurity: No Food Insecurity (01/19/2023)   Hunger Vital Sign    Worried About Running Out of Food in the Last Year: Never true    Ran Out of Food in the Last Year: Never true  Transportation Needs: No Transportation Needs (01/19/2023)   PRAPARE - Administrator, Civil Service (Medical): No    Lack of Transportation (Non-Medical): No  Physical Activity: Insufficiently Active (01/19/2023)   Exercise Vital Sign    Days of Exercise per Week: 7 days    Minutes of Exercise per Session: 20 min  Stress: No Stress Concern Present (01/19/2023)   [employer] of Occupational Health - Occupational Stress Questionnaire    Feeling of Stress : Not at all  Social Connections: Moderately Integrated (01/19/2023)  Social Advertising account executive [NHANES]    Frequency of Communication with Friends and Family: More than three times a week    Frequency of Social Gatherings with Friends and Family: More than three times a week    Attends Religious Services: More than 4 times per year    Active Member of [employer] or Organizations: Yes    Attends Banker Meetings: More than 4 times per year    Marital Status: Widowed   Past Surgical History:  Procedure Laterality Date   42 HOUR PH STUDY N/A 01/04/2016   Procedure: 24 HOUR PH STUDY;  Surgeon: Danette Duos, MD;  Location: [facility];  Service: Gastroenterology;  Laterality: N/A;   ABDOMINAL HYSTERECTOMY  1988   ADENOIDECTOMY     ANTERIOR CERVICAL DECOMP/DISCECTOMY FUSION  2009, 1993   first procedure in ~ 1993/Dr Botero   APPENDECTOMY   1988   ATRIAL FIBRILLATION ABLATION N/A 12/16/2021   Procedure: ATRIAL FIBRILLATION ABLATION;  Surgeon: Boyce Byes, MD;  Location: [facility];  Service: Cardiovascular;  Laterality: N/A;   ATRIAL FIBRILLATION ABLATION N/A 06/29/2023   Procedure: ATRIAL FIBRILLATION ABLATION;  Surgeon: Boyce Byes, MD;  Location: [facility];  Service: Cardiovascular;  Laterality: N/A;   BACK SURGERY     CARDIOVERSION N/A 04/03/2019   Procedure: CARDIOVERSION;  Surgeon: Liza Riggers, MD;  Location: [facility];  Service: Cardiovascular;  Laterality: N/A;   CHOLECYSTECTOMY  2005   ESOPHAGEAL MANOMETRY N/A 01/04/2016   Procedure: ESOPHAGEAL MANOMETRY (EM) 24 HR PH;  Surgeon: Danette Duos, MD;  Location: [facility];  Service: Gastroenterology;  Laterality: N/A;   ESOPHAGOGASTRODUODENOSCOPY (EGD) WITH PROPOFOL  N/A 05/17/2016   Procedure: ESOPHAGOGASTRODUODENOSCOPY (EGD) WITH PROPOFOL ;  Surgeon: Danette Duos, MD;  Location: [facility];  Service: Gastroenterology;  Laterality: N/A;   FOOT SURGERY Left 08/2022   NASAL SINUS SURGERY  2006   SAVORY DILATION N/A 05/17/2016   Procedure: SAVORY DILATION;  Surgeon: Danette Duos, MD;  Location: [facility];  Service: Gastroenterology;  Laterality: N/A;   TEE WITHOUT CARDIOVERSION N/A 04/03/2019   Procedure: TRANSESOPHAGEAL ECHOCARDIOGRAM (TEE)-with DCCV;  Surgeon: Liza Riggers, MD;  Location: [facility] ENDOSCOPY;; EF 55 to 60%.  No LVH.  Normal RV.  Moderate LA dilation.  No thrombus.  Mild RA dilation.  Mild to moderate TR.  Mild aortic valve sclerosis, no stenosis.  Moderately elevated PA pressures (47 mmHg)   TONSILLECTOMY AND ADENOIDECTOMY     TRANSTHORACIC ECHOCARDIOGRAM  2009   Normal LV size and function. EF 60-65%. No regional wall motion and amount is. Mild aortic sclerosis, no stenosis   TUBAL LIGATION  1979   Past Surgical History:  Procedure Laterality Date   61 HOUR PH STUDY N/A 01/04/2016    Procedure: 24 HOUR PH STUDY;  Surgeon: Danette Duos, MD;  Location: [facility];  Service: Gastroenterology;  Laterality: N/A;   ABDOMINAL HYSTERECTOMY  1988   ADENOIDECTOMY     ANTERIOR CERVICAL DECOMP/DISCECTOMY FUSION  2009, 1993   first procedure in ~ 1993/Dr Botero   APPENDECTOMY  1988   ATRIAL FIBRILLATION ABLATION N/A 12/16/2021   Procedure: ATRIAL FIBRILLATION ABLATION;  Surgeon: Boyce Byes, MD;  Location: [facility];  Service: Cardiovascular;  Laterality: N/A;   ATRIAL FIBRILLATION ABLATION N/A 06/29/2023   Procedure: ATRIAL FIBRILLATION ABLATION;  Surgeon: Boyce Byes, MD;  Location: [facility];  Service: Cardiovascular;  Laterality: N/A;   BACK SURGERY  CARDIOVERSION N/A 04/03/2019   Procedure: CARDIOVERSION;  Surgeon: Liza Riggers, MD;  Location: [facility] ENDOSCOPY;  Service: Cardiovascular;  Laterality: N/A;   CHOLECYSTECTOMY  2005   ESOPHAGEAL MANOMETRY N/A 01/04/2016   Procedure: ESOPHAGEAL MANOMETRY (EM) 24 HR PH;  Surgeon: Danette Duos, MD;  Location: [facility];  Service: Gastroenterology;  Laterality: N/A;   ESOPHAGOGASTRODUODENOSCOPY (EGD) WITH PROPOFOL  N/A 05/17/2016   Procedure: ESOPHAGOGASTRODUODENOSCOPY (EGD) WITH PROPOFOL ;  Surgeon: Danette Duos, MD;  Location: [facility];  Service: Gastroenterology;  Laterality: N/A;   FOOT SURGERY Left 08/2022   NASAL SINUS SURGERY  2006   SAVORY DILATION N/A 05/17/2016   Procedure: SAVORY DILATION;  Surgeon: Danette Duos, MD;  Location: [facility];  Service: Gastroenterology;  Laterality: N/A;   TEE WITHOUT CARDIOVERSION N/A 04/03/2019   Procedure: TRANSESOPHAGEAL ECHOCARDIOGRAM (TEE)-with DCCV;  Surgeon: Liza Riggers, MD;  Location: [facility] ENDOSCOPY;; EF 55 to 60%.  No LVH.  Normal RV.  Moderate LA dilation.  No thrombus.  Mild RA dilation.  Mild to moderate TR.  Mild aortic valve sclerosis, no stenosis.  Moderately elevated PA pressures (47 mmHg)    TONSILLECTOMY AND ADENOIDECTOMY     TRANSTHORACIC ECHOCARDIOGRAM  2009   Normal LV size and function. EF 60-65%. No regional wall motion and amount is. Mild aortic sclerosis, no stenosis   TUBAL LIGATION  1979   Past Medical History:  Diagnosis Date   B12 deficiency    Complication of anesthesia    slow to wake up    Depression    Diabetes mellitus type 2, diet-controlled (HCC) 07/24/2018   Fibromyalgia    Generalized hyperhidrosis 04/17/2014   GERD (gastroesophageal reflux disease)    History of rheumatic fever as a child    Hyperlipemia    Hypertension    denies htn on 05/09/16   HYPOTHYROIDISM 05/29/2007   Irritable bowel syndrome    Migraines    hx of    OSA (obstructive sleep apnea) 06/26/2019   Senile calcific aortic valve sclerosis    Aortic Sclerosis-no stenosis   BP 124/72   Pulse 65   Ht 5\' 4"  (1.626 m)   Wt 183 lb (83 kg)   SpO2 97%   BMI 31.41 kg/m   Opioid Risk Score:   Fall Risk Score:  `1  Depression screen [facility] 2/9     08/30/2023   11:34 AM 08/22/2023    1:03 PM 08/01/2023   11:21 AM 06/30/2023   11:10 AM 05/31/2023   11:32 AM 05/03/2023   11:19 AM 03/30/2023   10:15 AM  Depression screen PHQ 2/9  Decreased Interest 0 0 0 0 0 0 0  Down, Depressed, Hopeless 0 0 0 0 0 0 0  PHQ - 2 Score 0 0 0 0 0 0 0     Review of Systems  Musculoskeletal:  Positive for back pain.       Right shoulder pain Right thigh pain Left knee pain  All other systems reviewed and are negative.     Objective:   Physical Exam Vitals and nursing note reviewed.  Constitutional:      Appearance: Normal appearance. She is not diaphoretic.  Cardiovascular:     Rate and Rhythm: Normal rate and regular rhythm.     Pulses: Normal pulses.     Heart sounds: Normal heart sounds.  Pulmonary:     Effort: Pulmonary effort is normal.     Breath sounds: Normal breath sounds.  Musculoskeletal:  Comments: Normal Muscle Bulk and Muscle Testing Reveals:  Upper Extremities: Full ROM  and Muscle Strength 5/5  Thoracic Paraspinal Tenderness: T-4-T-6  Lower Extremities: Full ROM and Muscle Strength 5/5 Right Greater Trochanter Tenderness Lower Extremities: Full ROM and Muscle Strength 5/5 Arises from chair slowly Narrow Based  Gait     Skin:    General: Skin is warm.  Neurological:     Mental Status: She is alert and oriented to person, place, and time.  Psychiatric:        Mood and Affect: Mood normal.        Behavior: Behavior normal.         Assessment & Plan:   Cervical postlaminectomy syndrome:/ Cervicalgia/ Cervical Radiculitis/ with neck and shoulder pain/ Chronic Midline Thoracic Pain.  Continue exercise routine and using heat therapy. 10/27/2023. Continue : current medication regimen. Refilled: MS Contin  15 mg one tablet three times a day #90.   We will continue the opioid monitoring program, this consists of regular clinic visits, examinations, urine drug screen, pill counts as well as use of [place]  Controlled Substance Reporting system. A 12 month History has been reviewed on the [place]  Controlled Substance Reporting System on 11/28/2023.   2. Fibromyalgia/ Neuropathy: Continue current medication regimen with Gabapentin , activity and exercise regime. 11/28/2023. 3. Left Knee Pain/ Mild Degenerative Changes S/P : on 12/06/2022 with Dr Sharl Davies.   LEFT Genicular nerve block x 3, Upper medial, Upper lateral , and Lower Medial under fluoroscopic guidance    On 02/09/2023: S/P LEFT KNEE Genicular nerve Radiofrequency neurotomy x 3 nerves, Upper medial, Upper lateral , and Lower Medial under fluoroscopic guidance . Continue current medication regimen with Voltaren  Gel. 11/28/2023. 4. Bilateral  Ankle Pain:  No complaints today. Continue HEP as tolerated. Continue to monitor.11/28/2023. 5. Muscle Spasm: Continue current medication regimen with  Robaxin . Continue to Monitor.11/28/2023.. 6. Opioid Induced Constipation: No complaints Continue to  Monitor. 11/28/2023. 7. Midline Low Back Pain: Continue current medication regime, continue HEP as tolerated. 11/28/2023. 8. Right Gluteal Medius Weakness and Atrophy: No complaints Today:Continue with Hip Abduction Exercises as tolerated: Continue to Monitor. 11/28/2023. 9. Right Hip Pain/ Right Greater Trochanteric Tenderness: Denies Falling. RX: X-ray>  Continue to [employer] and Heat Therapy. Continue to Monitor. 11/28/2023  10. Sacroiliac Joint Pain:  No complaints today. S/P Sacroiliac Injection with good relief noted on 11/10/2022. Continue to monitor. 11/28/2023 11. Lumbar Radiculitis: No complaints today.Continue Gabapentin . Continue HEP as tolerated. Continue current medication regimen. Continue to monitor.  11/28/2023.     F/U in 1 month

## 2023-12-04 ENCOUNTER — Telehealth: Payer: Self-pay | Admitting: *Deleted

## 2023-12-04 DIAGNOSIS — E119 Type 2 diabetes mellitus without complications: Secondary | ICD-10-CM

## 2023-12-04 DIAGNOSIS — Z79899 Other long term (current) drug therapy: Secondary | ICD-10-CM

## 2023-12-04 DIAGNOSIS — E559 Vitamin D deficiency, unspecified: Secondary | ICD-10-CM

## 2023-12-04 DIAGNOSIS — E038 Other specified hypothyroidism: Secondary | ICD-10-CM

## 2023-12-04 DIAGNOSIS — E538 Deficiency of other specified B group vitamins: Secondary | ICD-10-CM

## 2023-12-04 DIAGNOSIS — E785 Hyperlipidemia, unspecified: Secondary | ICD-10-CM

## 2023-12-04 NOTE — Telephone Encounter (Signed)
-----   Message from Darcella Earnest sent at 12/04/2023  9:14 AM EDT ----- Regarding: Labs for Monday 6.30.25 Please put physical lab orders in future. Thank you, [place]

## 2023-12-20 ENCOUNTER — Ambulatory Visit: Admitting: Cardiology

## 2023-12-25 ENCOUNTER — Other Ambulatory Visit (INDEPENDENT_AMBULATORY_CARE_PROVIDER_SITE_OTHER)

## 2023-12-25 DIAGNOSIS — E559 Vitamin D deficiency, unspecified: Secondary | ICD-10-CM

## 2023-12-25 DIAGNOSIS — Z79899 Other long term (current) drug therapy: Secondary | ICD-10-CM

## 2023-12-25 DIAGNOSIS — E538 Deficiency of other specified B group vitamins: Secondary | ICD-10-CM | POA: Diagnosis not present

## 2023-12-25 DIAGNOSIS — E038 Other specified hypothyroidism: Secondary | ICD-10-CM

## 2023-12-25 DIAGNOSIS — E119 Type 2 diabetes mellitus without complications: Secondary | ICD-10-CM

## 2023-12-25 DIAGNOSIS — E785 Hyperlipidemia, unspecified: Secondary | ICD-10-CM

## 2023-12-25 LAB — BASIC METABOLIC PANEL WITH GFR
BUN: 16 mg/dL (ref 6–23)
CO2: 32 meq/L (ref 19–32)
Calcium: 9.4 mg/dL (ref 8.4–10.5)
Chloride: 102 meq/L (ref 96–112)
Creatinine, Ser: 0.66 mg/dL (ref 0.40–1.20)
GFR: 87.81 mL/min (ref >60.00)
Glucose, Bld: 112 mg/dL — ABNORMAL HIGH (ref 70–99)
Potassium: 4.2 meq/L (ref 3.5–5.1)
Sodium: 141 meq/L (ref 135–145)

## 2023-12-25 LAB — HEPATIC FUNCTION PANEL
ALT: 20 U/L (ref 0–35)
AST: 26 U/L (ref 0–37)
Albumin: 4.4 g/dL (ref 3.5–5.2)
Alkaline Phosphatase: 87 U/L (ref 39–117)
Bilirubin, Direct: 0.1 mg/dL (ref 0.0–0.3)
Total Bilirubin: 0.5 mg/dL (ref 0.2–1.2)
Total Protein: 6.6 g/dL (ref 6.0–8.3)

## 2023-12-25 LAB — CBC WITH DIFFERENTIAL/PLATELET
Basophils Absolute: 0 10*3/uL (ref 0.0–0.1)
Basophils Relative: 0.4 % (ref 0.0–3.0)
Eosinophils Absolute: 0.1 10*3/uL (ref 0.0–0.7)
Eosinophils Relative: 2.6 % (ref 0.0–5.0)
HCT: 35.3 % — ABNORMAL LOW (ref 36.0–46.0)
Hemoglobin: 12.4 g/dL (ref 12.0–15.0)
Lymphocytes Relative: 37.2 % (ref 12.0–46.0)
Lymphs Abs: 1.5 10*3/uL (ref 0.7–4.0)
MCHC: 35.1 g/dL (ref 30.0–36.0)
MCV: 89.6 fl (ref 78.0–100.0)
Monocytes Absolute: 0.3 10*3/uL (ref 0.1–1.0)
Monocytes Relative: 6.9 % (ref 3.0–12.0)
Neutro Abs: 2.1 10*3/uL (ref 1.4–7.7)
Neutrophils Relative %: 52.9 % (ref 43.0–77.0)
Platelets: 228 10*3/uL (ref 150.0–400.0)
RBC: 3.94 Mil/uL (ref 3.87–5.11)
RDW: 12.3 % (ref 11.5–15.5)
WBC: 4 10*3/uL (ref 4.0–10.5)

## 2023-12-25 LAB — LIPID PANEL
Cholesterol: 161 mg/dL (ref 0–200)
HDL: 51.7 mg/dL (ref >39.00)
LDL Cholesterol: 92 mg/dL (ref 0–99)
NonHDL: 109.51
Total CHOL/HDL Ratio: 3
Triglycerides: 86 mg/dL (ref 0.0–149.0)
VLDL: 17.2 mg/dL (ref 0.0–40.0)

## 2023-12-25 LAB — HEMOGLOBIN A1C: Hgb A1c MFr Bld: 6.1 % (ref 4.6–6.5)

## 2023-12-25 LAB — MICROALBUMIN / CREATININE URINE RATIO
Creatinine,U: 100.1 mg/dL
Microalb Creat Ratio: 15.5 mg/g (ref 0.0–30.0)
Microalb, Ur: 1.6 mg/dL (ref 0.0–1.9)

## 2023-12-25 LAB — T3, FREE: T3, Free: 4.1 pg/mL (ref 2.3–4.2)

## 2023-12-25 LAB — TSH: TSH: 4.26 u[IU]/mL (ref 0.35–5.50)

## 2023-12-25 LAB — T4, FREE: Free T4: 0.89 ng/dL (ref 0.60–1.60)

## 2023-12-25 LAB — VITAMIN B12: Vitamin B-12: 1130 pg/mL — ABNORMAL HIGH (ref 211–911)

## 2023-12-25 LAB — VITAMIN D 25 HYDROXY (VIT D DEFICIENCY, FRACTURES): VITD: 49.83 ng/mL (ref 30.00–100.00)

## 2023-12-28 ENCOUNTER — Encounter: Payer: Self-pay | Admitting: Registered Nurse

## 2023-12-28 ENCOUNTER — Encounter: Attending: Physical Medicine & Rehabilitation | Admitting: Registered Nurse

## 2023-12-28 VITALS — BP 129/69 | HR 60 | Ht 64.0 in | Wt 180.4 lb

## 2023-12-28 DIAGNOSIS — G894 Chronic pain syndrome: Secondary | ICD-10-CM | POA: Diagnosis present

## 2023-12-28 DIAGNOSIS — M546 Pain in thoracic spine: Secondary | ICD-10-CM | POA: Insufficient documentation

## 2023-12-28 DIAGNOSIS — M170 Bilateral primary osteoarthritis of knee: Secondary | ICD-10-CM | POA: Insufficient documentation

## 2023-12-28 DIAGNOSIS — Z79891 Long term (current) use of opiate analgesic: Secondary | ICD-10-CM | POA: Insufficient documentation

## 2023-12-28 DIAGNOSIS — G8929 Other chronic pain: Secondary | ICD-10-CM | POA: Diagnosis present

## 2023-12-28 DIAGNOSIS — Z5181 Encounter for therapeutic drug level monitoring: Secondary | ICD-10-CM | POA: Diagnosis not present

## 2023-12-28 MED ORDER — MORPHINE SULFATE ER 15 MG PO TBCR: 15.0000 mg | EXTENDED_RELEASE_TABLET | Freq: Three times a day (TID) | ORAL | 0 refills | Status: DC

## 2023-12-28 NOTE — Progress Notes (Signed)
 Subjective:    Patient ID: [name], female    DOB: [birth year], 73 y.o.   MRN: [id number]  HPI: [name] is a 73 y.o. female who returns for follow up appointment for chronic pain and medication refill. She states her pain is located in her mid- back and bilateral knee pain. She rates her pain 4. Her current exercise regime is walking and performing stretching exercises.  [name] Morphine  equivalent is 45.00  MME.   Last Oral Swab was Perormed, 08/30/2023, see note or details.     Pain Inventory Average Pain 5 Pain Right Now 4 My pain is constant, sharp, dull, and aching  In the last 24 hours, has pain interfered with the following? General activity 4 Relation with others 4 Enjoyment of life 4 What TIME of day is your pain at its worst? evening and night Sleep (in general) Good  Pain is worse with: walking, bending, standing, and some activites Pain improves with: rest, heat/ice, medication, and injections Relief from Meds: 5  Family History  Problem Relation Age of Onset   Breast cancer Mother    Diabetes Mother    Heart disease Mother    Allergies Mother    Diabetes Father    Heart disease Father    Emphysema Father    Allergies Father    Breast cancer Paternal Aunt    Colon cancer Neg Hx    Esophageal cancer Neg Hx    Rectal cancer Neg Hx    Stomach cancer Neg Hx    Social History   Socioeconomic History   Marital status: Widowed    Spouse name: Not on file   Number of children: Not on file   Years of education: Not on file   Highest education level: Not on file  Occupational History   Occupation: Catering manager   Occupation: retired  Tobacco Use   Smoking status: Never    Passive exposure: Past   Smokeless tobacco: Never   Tobacco comments:    Never smoke 01/13/22  Vaping Use   Vaping status: Never Used  Substance and Sexual Activity   Alcohol  use: Yes    Alcohol /week: 1.0 standard drink of alcohol     Types: 1 Glasses of wine per week     Comment: rare   Drug use: Never   Sexual activity: Not Currently  Other Topics Concern   Not on file  Social History Narrative   Not on file   Social Drivers of Health   Financial Resource Strain: Low Risk  (01/19/2023)   Overall Financial Resource Strain (CARDIA)    Difficulty of Paying Living Expenses: Not hard at all  Food Insecurity: No Food Insecurity (01/19/2023)   Hunger Vital Sign    Worried About Running Out of Food in the Last Year: Never true    Ran Out of Food in the Last Year: Never true  Transportation Needs: No Transportation Needs (01/19/2023)   PRAPARE - Administrator, Civil Service (Medical): No    Lack of Transportation (Non-Medical): No  Physical Activity: Insufficiently Active (01/19/2023)   Exercise Vital Sign    Days of Exercise per Week: 7 days    Minutes of Exercise per Session: 20 min  Stress: No Stress Concern Present (01/19/2023)   [employer] of Occupational Health - Occupational Stress Questionnaire    Feeling of Stress : Not at all  Social Connections: Moderately Integrated (01/19/2023)   Social Connection and Isolation Panel  Frequency of Communication with Friends and Family: More than three times a week    Frequency of Social Gatherings with Friends and Family: More than three times a week    Attends Religious Services: More than 4 times per year    Active Member of [employer] or Organizations: Yes    Attends Banker Meetings: More than 4 times per year    Marital Status: Widowed   Past Surgical History:  Procedure Laterality Date   101 HOUR PH STUDY N/A 01/04/2016   Procedure: 24 HOUR PH STUDY;  Surgeon: Elspeth Deward Naval, MD;  Location: [facility];  Service: Gastroenterology;  Laterality: N/A;   ABDOMINAL HYSTERECTOMY  1988   ADENOIDECTOMY     ANTERIOR CERVICAL DECOMP/DISCECTOMY FUSION  2009, 1993   first procedure in ~ 1993/Dr Botero   APPENDECTOMY  1988   ATRIAL FIBRILLATION ABLATION N/A 12/16/2021    Procedure: ATRIAL FIBRILLATION ABLATION;  Surgeon: Cindie Ole DASEN, MD;  Location: [facility];  Service: Cardiovascular;  Laterality: N/A;   ATRIAL FIBRILLATION ABLATION N/A 06/29/2023   Procedure: ATRIAL FIBRILLATION ABLATION;  Surgeon: Cindie Ole DASEN, MD;  Location: [facility];  Service: Cardiovascular;  Laterality: N/A;   BACK SURGERY     CARDIOVERSION N/A 04/03/2019   Procedure: CARDIOVERSION;  Surgeon: Maranda Leim DEL, MD;  Location: [facility] END[facility]COPY;  Service: Cardiovascular;  Laterality: N/A;   CHOLECYSTECTOMY  2005   ESOPHAGEAL MANOMETRY N/A 01/04/2016   Procedure: ESOPHAGEAL MANOMETRY (EM) 24 HR PH;  Surgeon: Elspeth Deward Naval, MD;  Location: [facility];  Service: Gastroenterology;  Laterality: N/A;   ESOPHAGOGASTRODUODENOSCOPY (EGD) WITH PROPOFOL  N/A 05/17/2016   Procedure: ESOPHAGOGASTRODUODENOSCOPY (EGD) WITH PROPOFOL ;  Surgeon: Elspeth Deward Naval, MD;  Location: [facility];  Service: Gastroenterology;  Laterality: N/A;   FOOT SURGERY Left 08/2022   NASAL SINUS SURGERY  2006   SAVORY DILATION N/A 05/17/2016   Procedure: SAVORY DILATION;  Surgeon: Elspeth Deward Naval, MD;  Location: [facility];  Service: Gastroenterology;  Laterality: N/A;   TEE WITHOUT CARDIOVERSION N/A 04/03/2019   Procedure: TRANSESOPHAGEAL ECHOCARDIOGRAM (TEE)-with DCCV;  Surgeon: Maranda Leim DEL, MD;  Location: [facility] ENDOSCOPY;; EF 55 to 60%.  No LVH.  Normal RV.  Moderate LA dilation.  No thrombus.  Mild RA dilation.  Mild to moderate TR.  Mild aortic valve sclerosis, no stenosis.  Moderately elevated PA pressures (47 mmHg)   TONSILLECTOMY AND ADENOIDECTOMY     TRANSTHORACIC ECHOCARDIOGRAM  2009   Normal LV size and function. EF 60-65%. No regional wall motion and amount is. Mild aortic sclerosis, no stenosis   TUBAL LIGATION  1979   Past Surgical History:  Procedure Laterality Date   59 HOUR PH STUDY N/A 01/04/2016   Procedure: 24 HOUR PH STUDY;  Surgeon: Elspeth Deward Naval, MD;  Location: [facility];  Service: Gastroenterology;  Laterality: N/A;   ABDOMINAL HYSTERECTOMY  1988   ADENOIDECTOMY     ANTERIOR CERVICAL DECOMP/DISCECTOMY FUSION  2009, 1993   first procedure in ~ 1993/Dr Botero   APPENDECTOMY  1988   ATRIAL FIBRILLATION ABLATION N/A 12/16/2021   Procedure: ATRIAL FIBRILLATION ABLATION;  Surgeon: Cindie Ole DASEN, MD;  Location: [facility];  Service: Cardiovascular;  Laterality: N/A;   ATRIAL FIBRILLATION ABLATION N/A 06/29/2023   Procedure: ATRIAL FIBRILLATION ABLATION;  Surgeon: Cindie Ole DASEN, MD;  Location: [facility];  Service: Cardiovascular;  Laterality: N/A;   BACK SURGERY     CARDIOVERSION N/A 04/03/2019   Procedure:  CARDIOVERSION;  Surgeon: Maranda Leim DEL, MD;  Location[facility] ENDOSCOPY;  Service: Cardiovascular;  Laterality: N/A;   CHOLECYSTECTOMY  2005   ESOPHAGEAL MANOMETRY N/A 01/04/2016   Procedure: ESOPHAGEAL MANOMETRY (EM) 24 HR PH;  Surgeon: Elspeth Deward Naval, MD;  Location: [facility];  Service: Gastroenterology;  Laterality: N/A;   ESOPHAGOGASTRODUODENOSCOPY (EGD) WITH PROPOFOL  N/A 05/17/2016   Procedure: ESOPHAGOGASTRODUODENOSCOPY (EGD) WITH PROPOFOL ;  Surgeon: Elspeth Deward Naval, MD;  Location: [facility];  Service: Gastroenterology;  Laterality: N/A;   FOOT SURGERY Left 08/2022   NASAL SINUS SURGERY  2006   SAVORY DILATION N/A 05/17/2016   Procedure: SAVORY DILATION;  Surgeon: Elspeth Deward Naval, MD;  Location: [facility];  Service: Gastroenterology;  Laterality: N/A;   TEE WITHOUT CARDIOVERSION N/A 04/03/2019   Procedure: TRANSESOPHAGEAL ECHOCARDIOGRAM (TEE)-with DCCV;  Surgeon: Maranda Leim DEL, MD;  Location: [facility] ENDOSCOPY;; EF 55 to 60%.  No LVH.  Normal RV.  Moderate LA dilation.  No thrombus.  Mild RA dilation.  Mild to moderate TR.  Mild aortic valve sclerosis, no stenosis.  Moderately elevated PA pressures (47 mmHg)   TONSILLECTOMY AND ADENOIDECTOMY     TRANSTHORACIC  ECHOCARDIOGRAM  2009   Normal LV size and function. EF 60-65%. No regional wall motion and amount is. Mild aortic sclerosis, no stenosis   TUBAL LIGATION  1979   Past Medical History:  Diagnosis Date   B12 deficiency    Complication of anesthesia    slow to wake up    Depression    Diabetes mellitus type 2, diet-controlled (HCC) 07/24/2018   Fibromyalgia    Generalized hyperhidrosis 04/17/2014   GERD (gastroesophageal reflux disease)    History of rheumatic fever as a child    Hyperlipemia    Hypertension    denies htn on 05/09/16   HYPOTHYROIDISM 05/29/2007   Irritable bowel syndrome    Migraines    hx of    OSA (obstructive sleep apnea) 06/26/2019   Senile calcific aortic valve sclerosis    Aortic Sclerosis-no stenosis   BP 129/69   Pulse (!) 58   Ht 5' 4 (1.626 m)   Wt 180 lb 6.4 oz (81.8 kg)   SpO2 99%   BMI 30.97 kg/m   Opioid Risk Score:   Fall Risk Score:  `1  Depression screen [facility] 2/9     12/28/2023   11:42 AM 08/30/2023   11:34 AM 08/22/2023    1:03 PM 08/01/2023   11:21 AM 06/30/2023   11:10 AM 05/31/2023   11:32 AM 05/03/2023   11:19 AM  Depression screen PHQ 2/9  Decreased Interest 0 0 0 0 0 0 0  Down, Depressed, Hopeless 0 0 0 0 0 0 0  PHQ - 2 Score 0 0 0 0 0 0 0     Review of Systems  Musculoskeletal:  Positive for arthralgias, back pain and neck pain.  All other systems reviewed and are negative.      Objective:   Physical Exam Vitals and nursing note reviewed.  Constitutional:      Appearance: Normal appearance.  Cardiovascular:     Rate and Rhythm: Normal rate and regular rhythm.     Pulses: Normal pulses.     Heart sounds: Normal heart sounds.  Pulmonary:     Effort: Pulmonary effort is normal.     Breath sounds: Normal breath sounds.  Musculoskeletal:     Comments: Normal Muscle Bulk and Muscle Testing Reveals:  Upper Extremities: Full ROM and Muscle Strength 5/5  Thoracic Paraspinal Tenderness: T-4-T-6 Right Greater Trochanter  Tenderness Lower Extremities: Full ROM and Muscle Strength 5/5 Arises from chair slowly Narrow Based Gait     Skin:    General: Skin is warm and dry.  Neurological:     Mental Status: She is alert and oriented to person, place, and time.  Psychiatric:        Mood and Affect: Mood normal.        Behavior: Behavior normal.          Assessment & Plan:   Cervical postlaminectomy syndrome:/ Cervicalgia/ Cervical Radiculitis/ with neck and shoulder pain/ Chronic Midline Thoracic Pain.  Continue exercise routine and using heat therapy. 10/27/2023. Continue : current medication regimen. Refilled: MS Contin  15 mg one tablet three times a day #90.   We will continue the opioid monitoring program, this consists of regular clinic visits, examinations, urine drug screen, pill counts as well as use of [place]  Controlled Substance Reporting system. A 12 month History has been reviewed on the [place]  Controlled Substance Reporting System on 12/28/2023.   2. Fibromyalgia/ Neuropathy: Continue current medication regimen with Gabapentin , activity and exercise regime. 12/28/2023. 3. Left Knee Pain/ Mild Degenerative Changes S/P : on 12/06/2022 with Dr Carilyn.   LEFT Genicular nerve block x 3, Upper medial, Upper lateral , and Lower Medial under fluoroscopic guidance    On 02/09/2023: S/P LEFT KNEE Genicular nerve Radiofrequency neurotomy x 3 nerves, Upper medial, Upper lateral , and Lower Medial under fluoroscopic guidance . Continue current medication regimen with Voltaren  Gel. 12/28/2023. 4. Bilateral  Ankle Pain:  No complaints today. Continue HEP as tolerated. Continue to monitor.12/28/2023. 5. Muscle Spasm: Continue current medication regimen with  Robaxin . Continue to Monitor.12/28/2023.. 6. Opioid Induced Constipation: No complaints Continue to Monitor. 12/28/2023. 7. Midline Low Back Pain: Continue current medication regime, continue HEP as tolerated. 12/28/2023. 8. Right Gluteal  Medius Weakness and Atrophy: No complaints Today:Continue with Hip Abduction Exercises as tolerated: Continue to Monitor. 12/28/2023. 9. Right Hip Pain/ Right Greater Trochanteric Tenderness: Denies Falling. RX: X-ray>  Continue to [employer] and Heat Therapy. Continue to Monitor. 12/28/2023  10. Sacroiliac Joint Pain:  No complaints today. S/P Sacroiliac Injection with good relief noted on 11/10/2022. Continue to monitor. 12/28/2023 11. Lumbar Radiculitis: No complaints today.Continue Gabapentin . Continue HEP as tolerated. Continue current medication regimen. Continue to monitor.  12/28/2023.     F/U in 1 month

## 2023-12-31 ENCOUNTER — Encounter: Payer: Self-pay | Admitting: Cardiology

## 2023-12-31 ENCOUNTER — Other Ambulatory Visit: Payer: Self-pay | Admitting: Cardiology

## 2023-12-31 DIAGNOSIS — I4819 Other persistent atrial fibrillation: Secondary | ICD-10-CM

## 2023-12-31 NOTE — Progress Notes (Unsigned)
 Alicia Singleton T. Stewart Pimenta, MD, CAQ Sports Medicine [name] [address] [place], [address]  Phone: [phone]  FAX: [phone]  [name] - 73 y.o. female  MRN [id number]  Date of Birth: 04/01/1951  Date: 01/01/2024  PCP: Watt Mirza, MD  Referral: Watt Mirza, MD  No chief complaint on file.  Patient Care Team: Watt Mirza, MD as PCP - General Anner Alm ORN, MD as PCP - Cardiology (Cardiology) Cindie Ole DASEN, MD as PCP - Electrophysiology (Cardiology) Carilyn Prentice BRAVO, MD as Consulting Physician (Physical Medicine and Rehabilitation) Armbruster, Elspeth SQUIBB, MD as Consulting Physician (Gastroenterology) Anner Alm ORN, MD as Consulting Physician (Cardiology) Fate Morna SAILOR, [facility] (Inactive) as Pharmacist (Pharmacist) Hope Almarie ORN, NP as Nurse Practitioner (Pulmonary Disease) Estelle Fallow (Optometry) Subjective:   [name] is a 73 y.o. pleasant patient who presents with the following:  Health Maintenance Summary Reviewed and updated, unless pt declines services.  Tobacco History Reviewed. Non-smoker Alcohol : No concerns, no excessive use Exercise Habits: Some activity, rec at least 30 mins 5 times a week STD concerns: none Drug Use: None Lumps or breast concerns: no  [name] is a very long-term patient who I actually not seen in more than a year and she presents for complete physical exam.  She is also here for follow-up hypothyroidism, lipids, hypertension.  History of depression, as well.  She also has persistent A-fib, and she is currently on Eliquis .  Diabetes Mellitus: Tolerating Medications: Blood sugars have been under great control with diet changes alone Compliance with diet: fair, There is no height or weight on file to calculate BMI. Exercise: minimal / intermittent Avg blood sugars at home: not checking Foot problems: none Hypoglycemia: none No nausea,  vomitting, blurred vision, polyuria.  Lab Results  Component Value Date   HGBA1C 6.1 12/25/2023   HGBA1C 6.1 10/03/2022   HGBA1C 6.6 (H) 08/23/2021   Lab Results  Component Value Date   MICROALBUR 1.6 12/25/2023   LDLCALC 92 12/25/2023   CREATININE 0.66 12/25/2023    Wt Readings from Last 3 Encounters:  12/28/23 180 lb 6.4 oz (81.8 kg)  11/28/23 183 lb (83 kg)  10/27/23 186 lb 12.8 oz (84.7 kg)     Health Maintenance  Topic Date Due   COVID-19 Vaccine (3 - Pfizer risk series) 10/16/2019   OPHTHALMOLOGY EXAM  10/29/2021   Zoster Vaccines- Shingrix (2 of 2) 06/29/2023   FOOT EXAM  10/10/2023   DTaP/Tdap/Td (3 - Tdap) 10/17/2023   Medicare Annual Wellness (AWV)  01/19/2024   INFLUENZA VACCINE  01/26/2024   HEMOGLOBIN A1C  06/25/2024   Diabetic kidney evaluation - eGFR measurement  12/24/2024   Diabetic kidney evaluation - Urine ACR  12/24/2024   MAMMOGRAM  02/09/2025   Colonoscopy  10/11/2028   Pneumococcal Vaccine: 50+ Years  Completed   DEXA SCAN  Completed   Hepatitis C Screening  Completed   Hepatitis B Vaccines  Aged Out   HPV VACCINES  Aged Out   Meningococcal B Vaccine  Aged Out   Fecal DNA (Cologuard)  Discontinued    Immunization History  Administered Date(s) Administered   PFIZER(Purple Top)SARS-COV-2 Vaccination 08/22/2019, 09/18/2019   Pneumococcal Conjugate-13 01/02/2017   Pneumococcal Polysaccharide-23 05/06/2013, 07/25/2018   Td 06/28/2003, 10/16/2013   Zoster Recombinant(Shingrix) 05/04/2023   Zoster, Live 11/07/2012   Patient Active Problem List   Diagnosis Date Noted   Postlaminectomy syndrome, cervical region 08/29/2011    Priority: High  FIBROMYALGIA 01/11/2007    Priority: High   Depression, major, recurrent, in partial remission (HCC) 09/22/2008    Priority: Medium    Hypothyroidism 05/29/2007    Priority: Medium    Hyperlipidemia with target LDL less than 100 05/29/2007    Priority: Medium    Essential hypertension 11/13/2006     Priority: Medium    Chronic right sacroiliac pain 04/29/2021   Hypercoagulable state due to persistent atrial fibrillation (HCC) 08/29/2019   OSA (obstructive sleep apnea) 06/26/2019   Longstanding persistent atrial fibrillation (HCC); CHA2D2DSVasc =4; Eliquis  05/10/2019   Diabetes mellitus type 2, diet-controlled (HCC) 07/24/2018   Obesity hypoventilation syndrome (HCC) 03/26/2015   Generalized hyperhidrosis 04/17/2014   Obesity (BMI 30-39.9) 08/23/2013   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 08/29/2011   Chest pain with moderate risk for cardiac etiology 08/10/2009   ALLERGIC RHINITIS 09/22/2008   Systolic ejection murmur 12/18/2007   Vitamin D  deficiency 01/11/2007   Vitamin B 12 deficiency 11/13/2006   GERD 11/13/2006    Past Medical History:  Diagnosis Date   B12 deficiency    Complication of anesthesia    slow to wake up    Depression    Diabetes mellitus type 2, diet-controlled (HCC) 07/24/2018   Fibromyalgia    Generalized hyperhidrosis 04/17/2014   GERD (gastroesophageal reflux disease)    History of rheumatic fever as a child    Hyperlipemia    Hypertension    denies htn on 05/09/16   HYPOTHYROIDISM 05/29/2007   Irritable bowel syndrome    Migraines    hx of    OSA (obstructive sleep apnea) 06/26/2019   Senile calcific aortic valve sclerosis    Aortic Sclerosis-no stenosis    Past Surgical History:  Procedure Laterality Date   58 HOUR PH STUDY N/A 01/04/2016   Procedure: 24 HOUR PH STUDY;  Surgeon: Elspeth Deward Naval, MD;  Location: [facility];  Service: Gastroenterology;  Laterality: N/A;   ABDOMINAL HYSTERECTOMY  1988   ADENOIDECTOMY     ANTERIOR CERVICAL DECOMP/DISCECTOMY FUSION  2009, 1993   first procedure in ~ 1993/Dr Botero   APPENDECTOMY  1988   ATRIAL FIBRILLATION ABLATION N/A 12/16/2021   Procedure: ATRIAL FIBRILLATION ABLATION;  Surgeon: Cindie Ole DASEN, MD;  Location: [facility];  Service: Cardiovascular;   Laterality: N/A;   ATRIAL FIBRILLATION ABLATION N/A 06/29/2023   Procedure: ATRIAL FIBRILLATION ABLATION;  Surgeon: Cindie Ole DASEN, MD;  Location: [facility];  Service: Cardiovascular;  Laterality: N/A;   BACK SURGERY     CARDIOVERSION N/A 04/03/2019   Procedure: CARDIOVERSION;  Surgeon: Maranda Leim DEL, MD;  Location: [facility]COPY;  Service: Cardiovascular;  Laterality: N/A;   CHOLECYSTECTOMY  2005   ESOPHAGEAL MANOMETRY N/A 01/04/2016   Procedure: ESOPHAGEAL MANOMETRY (EM) 24 HR PH;  Surgeon: Elspeth Deward Naval, MD;  Location: [facility];  Service: Gastroenterology;  Laterality: N/A;   ESOPHAGOGASTRODUODENOSCOPY (EGD) WITH PROPOFOL  N/A 05/17/2016   Procedure: ESOPHAGOGASTRODUODENOSCOPY (EGD) WITH PROPOFOL ;  Surgeon: Elspeth Deward Naval, MD;  Location: [facility];  Service: Gastroenterology;  Laterality: N/A;   FOOT SURGERY Left 08/2022   NASAL SINUS SURGERY  2006   SAVORY DILATION N/A 05/17/2016   Procedure: SAVORY DILATION;  Surgeon: Elspeth Deward Naval, MD;  Location: [facility];  Service: Gastroenterology;  Laterality: N/A;   TEE WITHOUT CARDIOVERSION N/A 04/03/2019   Procedure: TRANSESOPHAGEAL ECHOCARDIOGRAM (TEE)-with DCCV;  Surgeon: Maranda Leim DEL, MD;  Location: [facility] ENDOSCOPY;; EF 55 to 60%.  No LVH.  Normal  RV.  Moderate LA dilation.  No thrombus.  Mild RA dilation.  Mild to moderate TR.  Mild aortic valve sclerosis, no stenosis.  Moderately elevated PA pressures (47 mmHg)   TONSILLECTOMY AND ADENOIDECTOMY     TRANSTHORACIC ECHOCARDIOGRAM  2009   Normal LV size and function. EF 60-65%. No regional wall motion and amount is. Mild aortic sclerosis, no stenosis   TUBAL LIGATION  1979    Family History  Problem Relation Age of Onset   Breast cancer Mother    Diabetes Mother    Heart disease Mother    Allergies Mother    Diabetes Father    Heart disease Father    Emphysema Father    Allergies Father    Breast cancer Paternal Aunt    Colon cancer  Neg Hx    Esophageal cancer Neg Hx    Rectal cancer Neg Hx    Stomach cancer Neg Hx     Social History   Social History Narrative   Not on file    Past Medical History, Surgical History, Social History, Family History, Problem List, Medications, and Allergies have been reviewed and updated if relevant.  Review of Systems: Pertinent positives are listed above.  Otherwise, a full 14 point review of systems has been done in full and it is negative except where it is noted positive.  Objective:   There were no vitals taken for this visit. Ideal Body Weight:   No results found.    12/28/2023   11:42 AM 08/30/2023   11:34 AM 08/22/2023    1:03 PM 08/01/2023   11:21 AM 06/30/2023   11:10 AM  Depression screen PHQ 2/9  Decreased Interest 0 0 0 0 0  Down, Depressed, Hopeless 0 0 0 0 0  PHQ - 2 Score 0 0 0 0 0     GEN: well developed, well nourished, no acute distress Eyes: conjunctiva and lids normal, PERRLA, EOMI ENT: TM clear, nares clear, oral exam WNL Neck: supple, no lymphadenopathy, no thyromegaly, no JVD Pulm: clear to auscultation and percussion, respiratory effort normal CV: regular rate and rhythm, S1-S2, no murmur, rub or gallop, no bruits Chest: no scars, masses, no lumps BREAST: breast exam declined GI: soft, non-tender; no hepatosplenomegaly, masses; active bowel sounds all quadrants GU: GU exam declined Lymph: no cervical, axillary or inguinal adenopathy MSK: gait normal, muscle tone and strength WNL, no joint swelling, effusions, discoloration, crepitus  SKIN: clear, good turgor, color WNL, no rashes, lesions, or ulcerations Neuro: normal mental status, normal strength, sensation, and motion Psych: alert; oriented to person, place and time, normally interactive and not anxious or depressed in appearance.   All labs reviewed with patient. Results for orders placed or performed in visit on 12/25/23  T4, free   Collection Time: 12/25/23  8:39 AM  Result Value Ref  Range   Free T4 0.89 0.60 - 1.60 ng/dL  T3, free   Collection Time: 12/25/23  8:39 AM  Result Value Ref Range   T3, Free 4.1 2.3 - 4.2 pg/mL  TSH   Collection Time: 12/25/23  8:39 AM  Result Value Ref Range   TSH 4.26 0.35 - 5.50 uIU/mL  Microalbumin / creatinine urine ratio   Collection Time: 12/25/23  8:39 AM  Result Value Ref Range   Microalb, Ur 1.6 0.0 - 1.9 mg/dL   Creatinine,U 899.8 mg/dL   Microalb Creat Ratio 15.5 0.0 - 30.0 mg/g  Hemoglobin A1c   Collection Time: 12/25/23  8:39 AM  Result Value Ref Range   Hgb A1c MFr Bld 6.1 4.6 - 6.5 %  Lipid panel   Collection Time: 12/25/23  8:39 AM  Result Value Ref Range   Cholesterol 161 0 - 200 mg/dL   Triglycerides 13.9 0.0 - 149.0 mg/dL   HDL 48.29 >60.99 mg/dL   VLDL 82.7 0.0 - 59.9 mg/dL   LDL Cholesterol 92 0 - 99 mg/dL   Total CHOL/HDL Ratio 3    NonHDL 109.51   VITAMIN D  25 Hydroxy (Vit-D Deficiency, Fractures)   Collection Time: 12/25/23  8:39 AM  Result Value Ref Range   VITD 49.83 30.00 - 100.00 ng/mL  Vitamin B12   Collection Time: 12/25/23  8:39 AM  Result Value Ref Range   Vitamin B-12 1,130 (H) 211 - 911 pg/mL  Hepatic function panel   Collection Time: 12/25/23  8:39 AM  Result Value Ref Range   Total Bilirubin 0.5 0.2 - 1.2 mg/dL   Bilirubin, Direct 0.1 0.0 - 0.3 mg/dL   Alkaline Phosphatase 87 39 - 117 U/L   AST 26 0 - 37 U/L   ALT 20 0 - 35 U/L   Total Protein 6.6 6.0 - 8.3 g/dL   Albumin 4.4 3.5 - 5.2 g/dL  CBC with Differential/Platelet   Collection Time: 12/25/23  8:39 AM  Result Value Ref Range   WBC 4.0 4.0 - 10.5 K/uL   RBC 3.94 3.87 - 5.11 Mil/uL   Hemoglobin 12.4 12.0 - 15.0 g/dL   HCT 64.6 (L) 63.9 - 53.9 %   MCV 89.6 78.0 - 100.0 fl   MCHC 35.1 30.0 - 36.0 g/dL   RDW 87.6 88.4 - 84.4 %   Platelets 228.0 150.0 - 400.0 K/uL   Neutrophils Relative % 52.9 43.0 - 77.0 %   Lymphocytes Relative 37.2 12.0 - 46.0 %   Monocytes Relative 6.9 3.0 - 12.0 %   Eosinophils Relative 2.6 0.0 -  5.0 %   Basophils Relative 0.4 0.0 - 3.0 %   Neutro Abs 2.1 1.4 - 7.7 K/uL   Lymphs Abs 1.5 0.7 - 4.0 K/uL   Monocytes Absolute 0.3 0.1 - 1.0 K/uL   Eosinophils Absolute 0.1 0.0 - 0.7 K/uL   Basophils Absolute 0.0 0.0 - 0.1 K/uL  Basic metabolic panel   Collection Time: 12/25/23  8:39 AM  Result Value Ref Range   Sodium 141 135 - 145 mEq/L   Potassium 4.2 3.5 - 5.1 mEq/L   Chloride 102 96 - 112 mEq/L   CO2 32 19 - 32 mEq/L   Glucose, Bld 112 (H) 70 - 99 mg/dL   BUN 16 6 - 23 mg/dL   Creatinine, Ser 9.33 0.40 - 1.20 mg/dL   GFR 12.18 >39.99 mL/min   Calcium 9.4 8.4 - 10.5 mg/dL   *Note: Due to a large number of results and/or encounters for the requested time period, some results have not been displayed. A complete set of results can be found in Results Review.   No results found.  Assessment and Plan:     ICD-10-CM   1. Healthcare maintenance  Z00.00       Health Maintenance Exam: The patient's preventative maintenance and recommended screening tests for an annual wellness exam were reviewed in full today. Brought up to date unless services declined.  Counselled on the importance of diet, exercise, and its role in overall health and mortality. The patient's FH and SH was reviewed, including their home life, tobacco status, and drug and alcohol   status.  Follow-up in 1 year for physical exam or additional follow-up below.  Disposition: No follow-ups on file.  Future Appointments  Date Time Provider Department Center  01/01/2024  2:20 PM Watt Mirza, MD [facility]  01/24/2024 10:10 AM [facility] ANNUAL WELLNESS VISIT 1 [facility]  02/01/2024 11:40 AM Debby Fidela CROME, NP [facility]  02/19/2024  1:40 PM Anner Alm ORN, MD [facility]    No orders of the defined types were placed in this encounter.  There are no discontinued medications. No orders of the defined types were placed in this encounter.   Signed,  Mirza DASEN. Marvie Calender, MD   Allergies as of  01/01/2024       Reactions   Cyclobenzaprine Hcl Anaphylaxis, Swelling   Reaction - throat closed, tongue swelling   Naproxen Rash, Hives   Other    Other Reaction(s): other   Oxycodone Hcl Diarrhea, Nausea Only   Other Reaction(s): vomiting   Rizatriptan Benzoate Anaphylaxis   Throat and Tongue Swelling Closed   Sumatriptan Swelling, Anaphylaxis   Throat closed & Tongue Swelling   Tizanidine  Shortness Of Breath   Aleve [naproxen Sodium] Hives   Aspartame Other (See Comments)   Clarithromycin Hives   Doxycycline Diarrhea, Nausea And Vomiting   Duloxetine Nausea Only   Egg [egg-derived Products] Diarrhea, Nausea And Vomiting   Keflex  [cephalexin ] Itching, Other (See Comments)   Sores   Metoprolol  Other (See Comments)   Extreme depression   Metronidazole Diarrhea, Nausea Only   Monosodium Glutamate Diarrhea, Other (See Comments)   Oxycontin [oxycodone Hcl] Nausea And Vomiting   Penicillins Other (See Comments)   Unknown reaction Did it involve swelling of the face/tongue/throat, SOB, or low BP? Unknown Did it involve sudden or severe rash/hives, skin peeling, or any reaction on the inside of your mouth or nose? Unknown Did you need to seek medical attention at a hospital or doctor's office? Unknown When did it last happen?   infant   If all above answers are "NO", may proceed with cephalosporin use.   Rosuvastatin Other (See Comments)   Muscle aches   Venlafaxine Hives   Voltaren  [diclofenac  Sodium] Hives   Reaction to oral med, uses gel occasionally   Septra  [sulfamethoxazole -trimethoprim ] Itching, Rash   Tramadol Diarrhea, Nausea And Vomiting, Palpitations        Medication List        Accurate as of December 31, 2023  9:49 AM. If you have any questions, ask your nurse or doctor.          acetaminophen  650 MG CR tablet Commonly known as: TYLENOL  Take 650 mg by mouth in the morning and at bedtime.   b complex vitamins capsule Take 1 capsule by mouth daily.    B-12 1000 MCG Tabs Take 1,000 mcg by mouth daily.   Benefiber Chew Chew 2 tablets by mouth 2 (two) times daily as needed (regularity).   CALCIUM CITRATE PO Take by mouth daily.   cetirizine 10 MG tablet Commonly known as: ZYRTEC Take 10 mg by mouth at bedtime.   clindamycin  1 % lotion Commonly known as: CLEOCIN  T Apply 1 application topically daily as needed (eczema).   Coenzyme Q-10 100 MG capsule Take 100 mg by mouth every morning.   COLLAGEN PO Take by mouth.   [employer] Vitamin C 500 MG tablet Generic drug: ascorbic acid 500 mg daily.   diclofenac  sodium 1 % Gel Commonly known as: VOLTAREN  Apply 4 g topically 4 (four) times daily as  needed (joint pain).   diltiazem  240 MG 24 hr capsule Commonly known as: CARDIZEM  CD TAKE 1 CAPSULE BY MOUTH EVERY DAY   diltiazem  30 MG tablet Commonly known as: CARDIZEM  TAKE ONE TABLET BY MOUTH EVERY 4 HOURS AS NEEDED FOR HEART RATE GREATER THAN 100, TOP NUMBER BLOOD PRESSURE NEEDS TO BE ABOVE 100   docusate sodium 250 MG capsule Commonly known as: COLACE Take 250 mg by mouth at bedtime.   Eliquis  5 MG Tabs tablet Generic drug: apixaban  TAKE 1 TABLET BY MOUTH TWICE  DAILY   fluocinonide 0.05 % external solution Commonly known as: LIDEX Apply 1 application topically daily as needed (eczema on scalp).   gabapentin  300 MG capsule Commonly known as: NEURONTIN  TAKE 1 CAPSULE BY MOUTH 3 TIMES  DAILY   hydroxypropyl methylcellulose / hypromellose 2.5 % ophthalmic solution Commonly known as: ISOPTO TEARS / GONIOVISC Place 1-2 drops into both eyes 3 (three) times daily as needed for dry eyes (dry/irritated eyes.). Refresh Mega-3   levothyroxine  50 MCG tablet Commonly known as: SYNTHROID  TAKE 1 TABLET BY MOUTH DAILY   loratadine 10 MG tablet Commonly known as: CLARITIN Take 10 mg by mouth every morning.   MAG GLYCINATE PO Take 400 mg by mouth at bedtime.   methocarbamol  500 MG tablet Commonly known as: ROBAXIN  TAKE 1 TABLET  BY MOUTH EVERY 8 HOURS AS NEEDED FOR MUSCLE SPASMS   morphine  15 MG 12 hr tablet Commonly known as: MS Contin  Take 1 tablet (15 mg total) by mouth 3 (three) times daily. Do Not Fill Before 01/26/2024   oxybutynin  10 MG 24 hr tablet Commonly known as: DITROPAN -XL TAKE 1 TABLET BY MOUTH AT  BEDTIME   oxymetazoline 0.05 % nasal spray Commonly known as: AFRIN Place 1 spray into both nostrils 2 (two) times daily as needed for congestion.   RABEprazole  20 MG tablet Commonly known as: ACIPHEX  TAKE 1 TABLET BY MOUTH EVERY DAY IN THE EVENING   Resveratrol 100 MG Caps Take 100 mg by mouth daily.   tretinoin 0.05 % cream Commonly known as: RETIN-A Apply 1 application  topically daily as needed (acne).   Vitamin D  125 MCG (5000 UT) Caps Take 5,000 Units by mouth every morning.

## 2024-01-01 ENCOUNTER — Ambulatory Visit (INDEPENDENT_AMBULATORY_CARE_PROVIDER_SITE_OTHER): Admitting: Family Medicine

## 2024-01-01 ENCOUNTER — Encounter: Payer: Self-pay | Admitting: Family Medicine

## 2024-01-01 VITALS — BP 120/60 | HR 68 | Temp 99.2°F | Ht 64.0 in | Wt 182.1 lb

## 2024-01-01 DIAGNOSIS — Z0000 Encounter for general adult medical examination without abnormal findings: Secondary | ICD-10-CM | POA: Diagnosis not present

## 2024-01-02 ENCOUNTER — Encounter: Payer: Self-pay | Admitting: Family Medicine

## 2024-01-15 ENCOUNTER — Encounter: Payer: Self-pay | Admitting: Cardiology

## 2024-01-15 ENCOUNTER — Ambulatory Visit: Attending: Cardiology | Admitting: Cardiology

## 2024-01-15 VITALS — BP 150/64 | HR 56 | Ht 64.0 in | Wt 181.2 lb

## 2024-01-15 DIAGNOSIS — I4811 Longstanding persistent atrial fibrillation: Secondary | ICD-10-CM

## 2024-01-15 DIAGNOSIS — R011 Cardiac murmur, unspecified: Secondary | ICD-10-CM

## 2024-01-15 DIAGNOSIS — I10 Essential (primary) hypertension: Secondary | ICD-10-CM

## 2024-01-15 DIAGNOSIS — R600 Localized edema: Secondary | ICD-10-CM

## 2024-01-15 DIAGNOSIS — I4819 Other persistent atrial fibrillation: Secondary | ICD-10-CM

## 2024-01-15 DIAGNOSIS — R42 Dizziness and giddiness: Secondary | ICD-10-CM

## 2024-01-15 DIAGNOSIS — E038 Other specified hypothyroidism: Secondary | ICD-10-CM

## 2024-01-15 DIAGNOSIS — D6869 Other thrombophilia: Secondary | ICD-10-CM

## 2024-01-15 NOTE — Progress Notes (Unsigned)
 Cardiology Office Note:  .   Date:  01/19/2024  ID:  [name], DOB [birth year], MRN [id number] PCP/Referring Provider: Watt Mirza, MD  [facility] Providers Cardiologist:  Alm Clay, MD Electrophysiologist:  OLE ONEIDA HOLTS, MD     Chief Complaint  Patient presents with   Follow-up    Delayed 23-month follow-up.  Had redo ablation since last visit   Atrial Fibrillation    And redo ablation in January 2025.  No symptoms. Has ?s about meds    Patient Profile: .     [name] is a mildly obese 73 y.o. female  with a PMH notable for PAF s/p redo ablation (PVI) who presents here for 66-month follow-up with primary cardiologist..    PMH notable for Persistent Atrial Fibrillation (s/p ablation in June 2023 -=> redo ablation in January 2025), HTN, DM-2, hypothyroidism       [name] was last seen on April 17, 2023 following her A-fib ablation.  She was doing well at that time with no recurrent episodes of A-fib although she had noted some episodes of elevated heart rates but were relatively brief lasting a few seconds.  She switched her Cardizem  dose to bedtime because most episodes that happened overnight.  She was then describing some nonspecific chest discomfort associated with exertion most time but also also might happen at rest, but would only last a minute or so. => We ordered a Myoview  stress test to evaluate the chest pain and also an echocardiogram to reassess her aortic valve murmur.  She was then seen in December 2024 by Dr. HOLTS and was scheduled for redo atrial fibrillation ablation.  Most recent follow-up was with Elvie Needle, NP on September 27, 2023.  Denies any follow-up AF episodes.  No bleeding issues.  Continued on 24 mg diltiazem  daily as well as Eliquis  5 mg twice daily.  Recommended following up untreated OSA with pulmonary medicine  Subjective  Discussed the use of AI scribe software for clinical note transcription with the  patient, who gave verbal consent to proceed.  History of Present Illness  [name] is a 73 year old female with atrial fibrillation who presents for follow-up after ablation.  She underwent an ablation procedure in January for atrial fibrillation and has not experienced any episodes since. She has not noticed any irregular heartbeats.  She experiences some difficulty with breathing, describing it as 'struggling' but not being out of breath. This sensation began last Wednesday and persists. No increased swelling, orthopnea, or paroxysmal nocturnal dyspnea. She also reports nausea on two occasions, last Sunday night and the previous Sunday night, without any associated palpitations.  Her left leg swells more than the right, a condition persisting since foot surgery. Swelling in her legs decreases with elevation at night. She notes that her blood pressure was high during the visit, although it is not usually elevated. She is on diltiazem  and Eliquis , and reports a bleeding issue where a knee injury bled for three days.  No dizziness, wooziness, or lightheadedness except for occasional slight lightheadedness. No dizziness upon standing, chest pain, stroke symptoms, or significant bleeding issues. She can perform daily activities without shortness of breath, though she occasionally feels breathless after activity in hot weather.  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 No issues with nocturnal dyspnea or waking up unable to breathe.    Objective   Pertinent cardiac  medicines: Diltiazem  XT 240 mg daily, Eliquis  5 mg twice daily, Synthroid  50 mcg daily,  Studies Reviewed: .        Echocardiogram: EF 60 to 65%.  No RWMA.  Normal diastolic parameters.  Mild RV dilation with borderline elevated PAP.  Mild LA dilation.  Mild to moderate MR.  Moderate TR.  Mild AoV sclerosis with no stenosis.  Normal RAP.   (05/16/2023)  Myoview : LOW, RISK.  No ischemia or infarction.  Hyperdynamic LV with EF greater than 65 %.  No RWMA.  (November 2024)  Zio patch monitor October 2024: Heart rate range 52-2 03 with an average of 75.  1 4 beat run of PVCs.  44 atrial runs with the longest being 14.4 seconds and an average of 112 bpm.  No A-fib or other sustained arrhythmias.   Risk Assessment/Calculations:    CHA2DS2-VASc Score = 4   This indicates a 4.8% annual risk of stroke. The patient's score is based upon: CHF History: 0 HTN History: 1 Diabetes History: 1 Stroke History: 0 Vascular Disease History: 0 Age Score: 1 Gender Score: 1           Physical Exam:   VS:  BP (!) 150/64 (BP Location: Left Arm, Patient Position: Sitting, Cuff Size: Normal)   Pulse (!) 56   Ht 5' 4 (1.626 m)   Wt 181 lb 3.2 oz (82.2 kg)   SpO2 99%   BMI 31.10 kg/m   repeat BP was 144/62 Wt Readings from Last 3 Encounters:  01/15/24 181 lb 3.2 oz (82.2 kg)  01/01/24 182 lb 2 oz (82.6 kg)  12/28/23 180 lb 6.4 oz (81.8 kg)    GEN: Well nourished, well groomed in no acute distress; mlidly obese  NECK: No JVD; No carotid bruits CARDIAC: Normal S1, S2; RRR, no murmurs, rubs, gallops RESPIRATORY:  Clear to auscultation without rales, wheezing or rhonchi ; nonlabored, good air movement. ABDOMEN: Soft, non-tender, non-distended EXTREMITIES:  No edema; No deformity      ASSESSMENT AND PLAN: .    Problem List Items Addressed This Visit       Cardiology Problems   Essential hypertension (Chronic)   Blood pressure elevated during visit but typically well-controlled at home. Diltiazem  used for rate control. - Consider alternative antihypertensive medication if diltiazem  is reduced. => Would likely consider ARB or ACE inhibitor as a next option for treatment as there is some benefit in A-fib.      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  Remains on Eliquis .   1 would hope that if she is has successful results from this most recent ablation that she may potentially go, off DOAC, but for now we will continue Eliquis  5 mg twice daily.  - > Okay to hold Eliquis  2 to 3 days preop for surgeries or procedures as per protocol.      Longstanding persistent atrial fibrillation (HCC); CHA2D2DSVasc =4; Eliquis  - Primary (Chronic)   Initial ablation in 2023 with recurrent A-fib now status post redo ablation in January 2025.  Has done well since ablation, with no further episodes. -Continue diltiazem   240 mg daily.  If heart rate becomes too slow may want to reduce the dose in which case additional antihypertensive regimen is warranted. - Discuss with EP team about discontinuing Eliquis  if no episodes occur by next January.        Other   Episodic lightheadedness   Intermittent lightheadedness possibly related to diltiazem ,  dehydration or physical inactivity. No syncope or significant dizziness.  Encourage adequate hydration.  Slow to stand.  Remain active.      Hypothyroidism (Chronic)   On Synthroid       Lower extremity edema (Chronic)   Mild edema likely related to diltiazem , decreases with leg elevation. - Continue leg elevation (and consider support stockings) to manage edema.      Systolic ejection murmur (Chronic)   IDue to calcified valve with back leaking, no significant narrowing or symptoms. Last year's echocardiogram and stress test were normal. - Consider echocardiogram if symptoms change or during next year's follow-up.               Follow-Up: Return in about 1 year (around 01/14/2025) for Routine follow up with me, [employer].     Signed, Alm MICAEL Clay, MD, MS Alm Clay, M.D., M.S. Interventional Chartered certified accountant  Pager # [phone]

## 2024-01-15 NOTE — Patient Instructions (Addendum)
 Medication Instructions:    *If you need a refill on your cardiac medications before your next appointment, please call your pharmacy*   Lab Work:  If you have labs (blood work) drawn today and your tests are completely normal, you will receive your results only by: MyChart Message (if you have MyChart) OR A paper copy in the mail If you have any lab test that is abnormal or we need to change your treatment, we will call you to review the results.   Testing/Procedures:    Follow-Up: At [facility], you and your health needs are our priority.  As part of our continuing mission to provide you with exceptional heart care, we have created designated Provider Care Teams.  These Care Teams include your primary Cardiologist (physician) and Advanced Practice Providers (APPs -  Physician Assistants and Nurse Practitioners) who all work together to provide you with the care you need, when you need it.     Your next appointment:   12 month(s)  The format for your next appointment:   In Person  Provider:   Alm Clay, MD

## 2024-01-17 ENCOUNTER — Encounter: Payer: Self-pay | Admitting: Physical Medicine & Rehabilitation

## 2024-01-19 ENCOUNTER — Encounter: Payer: Self-pay | Admitting: Cardiology

## 2024-01-19 DIAGNOSIS — R42 Dizziness and giddiness: Secondary | ICD-10-CM | POA: Insufficient documentation

## 2024-01-19 DIAGNOSIS — R600 Localized edema: Secondary | ICD-10-CM | POA: Insufficient documentation

## 2024-01-19 NOTE — Assessment & Plan Note (Signed)
 Intermittent lightheadedness possibly related to diltiazem , dehydration or physical inactivity. No syncope or significant dizziness.  Encourage adequate hydration.  Slow to stand.  Remain active.

## 2024-01-19 NOTE — Assessment & Plan Note (Signed)
 CHA2DS2-VASc risk 4.  Remains on Eliquis .  1 would hope that if she is has successful results from this most recent ablation that she may potentially go, off DOAC, but for now we will continue Eliquis  5 mg twice daily.  - > Okay to hold Eliquis  2 to 3 days preop for surgeries or procedures as per protocol.

## 2024-01-19 NOTE — Assessment & Plan Note (Signed)
 Mild edema likely related to diltiazem , decreases with leg elevation. - Continue leg elevation (and consider support stockings) to manage edema.

## 2024-01-19 NOTE — Assessment & Plan Note (Signed)
 Initial ablation in 2023 with recurrent A-fib now status post redo ablation in January 2025.  Has done well since ablation, with no further episodes. -Continue diltiazem   240 mg daily.  If heart rate becomes too slow may want to reduce the dose in which case additional antihypertensive regimen is warranted. - Discuss with EP team about discontinuing Eliquis  if no episodes occur by next January.

## 2024-01-19 NOTE — Assessment & Plan Note (Signed)
 On Synthroid

## 2024-01-19 NOTE — Assessment & Plan Note (Signed)
 IDue to calcified valve with back leaking, no significant narrowing or symptoms. Last year's echocardiogram and stress test were normal. - Consider echocardiogram if symptoms change or during next year's follow-up.

## 2024-01-19 NOTE — Assessment & Plan Note (Signed)
 Blood pressure elevated during visit but typically well-controlled at home. Diltiazem  used for rate control. - Consider alternative antihypertensive medication if diltiazem  is reduced. => Would likely consider ARB or ACE inhibitor as a next option for treatment as there is some benefit in A-fib.

## 2024-01-25 ENCOUNTER — Other Ambulatory Visit: Payer: Self-pay | Admitting: Family Medicine

## 2024-01-25 DIAGNOSIS — Z1231 Encounter for screening mammogram for malignant neoplasm of breast: Secondary | ICD-10-CM

## 2024-02-01 ENCOUNTER — Encounter: Admitting: Registered Nurse

## 2024-02-06 ENCOUNTER — Encounter: Attending: Physical Medicine & Rehabilitation | Admitting: Physical Medicine & Rehabilitation

## 2024-02-06 ENCOUNTER — Encounter: Payer: Self-pay | Admitting: Physical Medicine & Rehabilitation

## 2024-02-06 VITALS — Temp 98.3°F | Ht 64.0 in | Wt 179.0 lb

## 2024-02-06 DIAGNOSIS — M533 Sacrococcygeal disorders, not elsewhere classified: Secondary | ICD-10-CM | POA: Insufficient documentation

## 2024-02-06 MED ORDER — IOHEXOL 180 MG/ML  SOLN
1.0000 mL | Freq: Once | INTRAMUSCULAR | Status: AC
  Administered 2024-02-06 (×2): 1 mL

## 2024-02-06 MED ORDER — BETAMETHASONE SOD PHOS & ACET 6 (3-3) MG/ML IJ SUSP
3.0000 mg | Freq: Once | INTRAMUSCULAR | Status: AC
  Administered 2024-02-06 (×2): 3 mg via INTRA_ARTICULAR

## 2024-02-06 MED ORDER — LIDOCAINE HCL 1 % IJ SOLN
5.0000 mL | Freq: Once | INTRAMUSCULAR | Status: AC
  Administered 2024-02-06 (×2): 5 mL

## 2024-02-06 MED ORDER — LIDOCAINE HCL (PF) 2 % IJ SOLN
1.0000 mL | Freq: Once | INTRAMUSCULAR | Status: AC
  Administered 2024-02-06 (×2): 1 mL

## 2024-02-06 NOTE — Progress Notes (Signed)
Right sacroiliac injection under fluoroscopic guidance  Indication: Right Low back and buttocks pain not relieved by medication management and other conservative care.  Informed consent was obtained after describing risks and benefits of the procedure with the patient, this includes bleeding, bruising, infection, paralysis and medication side effects. The patient wishes to proceed and has given written consent. The patient was placed in a prone position. The lumbar and sacral area was marked and prepped with Betadine. A 25-gauge 1-1/2 inch needle was inserted into the skin and subcutaneous tissue and 1 mL of 1% lidocaine was injected. Then a 25-gauge 3 inch spinal needle was inserted under fluoroscopic guidance into the Right sacroiliac joint. AP and lateral images were utilized. Omnipaque 180x0.5 mL under live fluoroscopy demonstrated no intravascular uptake. Then a solution containing one ML of 6 mgper ml betamethasone and 2 ML of 1% lidocaine MPF was injected x1.5 mL. Patient tolerated the procedure well. Post procedure instructions were given. Please see post procedure form. 

## 2024-02-06 NOTE — Progress Notes (Signed)
  PROCEDURE RECORD [facility]   Name: DUSTEE BOTTENFIELD DOB:Nov 18, 1950 MRN: [id number]  Date:02/06/2024  Physician: Prentice Compton, MD    Nurse/CMA: Jama, CMA  Allergies:  Allergies  Allergen Reactions   Cyclobenzaprine Hcl Anaphylaxis and Swelling    Reaction - throat closed, tongue swelling   Naproxen Rash and Hives   Other     Other Reaction(s): other   Oxycodone Hcl Diarrhea and Nausea Only    Other Reaction(s): vomiting   Rizatriptan Benzoate Anaphylaxis    Throat and Tongue Swelling Closed   Sumatriptan Swelling and Anaphylaxis    Throat closed & Tongue Swelling   Tizanidine  Shortness Of Breath   Aleve [Naproxen Sodium] Hives   Aspartame Other (See Comments)   Clarithromycin Hives   Doxycycline Diarrhea and Nausea And Vomiting   Duloxetine Nausea Only   Egg [Egg-Derived Products] Diarrhea and Nausea And Vomiting   Keflex  [Cephalexin ] Itching and Other (See Comments)    Sores   Metoprolol  Other (See Comments)    Extreme depression   Metronidazole Diarrhea and Nausea Only   Monosodium Glutamate Diarrhea and Other (See Comments)   Oxycontin [Oxycodone Hcl] Nausea And Vomiting   Penicillins Other (See Comments)    Unknown reaction Did it involve swelling of the face/tongue/throat, SOB, or low BP? Unknown Did it involve sudden or severe rash/hives, skin peeling, or any reaction on the inside of your mouth or nose? Unknown Did you need to seek medical attention at a hospital or doctor's office? Unknown When did it last happen?   infant   If all above answers are "NO", may proceed with cephalosporin use.   Rosuvastatin Other (See Comments)    Muscle aches   Venlafaxine Hives   Voltaren  [Diclofenac  Sodium] Hives    Reaction to oral med, uses gel occasionally   Septra  [Sulfamethoxazole -Trimethoprim ] Itching and Rash   Tramadol Diarrhea, Nausea And Vomiting and Palpitations    Consent Signed: Yes.    Is patient diabetic? Yes.    CBG  today? Type 2 controlled by diet, don't check blood sugar  Pregnant: No. LMP: No LMP recorded. Patient has had a hysterectomy. (age [age])  Anticoagulants: yes (Eliquis ) Anti-inflammatory: no Antibiotics: no  Procedure: Right Sacroiliac Joint Injection  Position: Prone Start Time: 12:54 pm  End Time: 12:57 pm  Fluoro Time: 15  RN/CMA Mohogany Toppins, CMA Cosby Proby,CMA    Time 12:36 pm 1:06 pm    BP 122/52 136/72    Pulse 59 61    Respirations 16 16    O2 Sat 59 98    S/S 6 6    Pain Level 5/10 2     D/C home with no one, patient A & O X 3, D/C instructions reviewed, and sits independently.

## 2024-02-13 ENCOUNTER — Other Ambulatory Visit: Payer: Self-pay | Admitting: Registered Nurse

## 2024-02-17 ENCOUNTER — Other Ambulatory Visit: Payer: Self-pay | Admitting: Family Medicine

## 2024-02-19 ENCOUNTER — Telehealth: Payer: Self-pay | Admitting: Registered Nurse

## 2024-02-19 ENCOUNTER — Ambulatory Visit: Admitting: Cardiology

## 2024-02-19 ENCOUNTER — Ambulatory Visit
Admission: RE | Admit: 2024-02-19 | Discharge: 2024-02-19 | Disposition: A | Discharge status: Home / Self Care | Source: Ambulatory Visit | Attending: Family Medicine | Admitting: Family Medicine

## 2024-02-19 DIAGNOSIS — Z1231 Encounter for screening mammogram for malignant neoplasm of breast: Secondary | ICD-10-CM

## 2024-02-29 ENCOUNTER — Encounter: Payer: Self-pay | Admitting: Registered Nurse

## 2024-02-29 ENCOUNTER — Encounter: Attending: Physical Medicine & Rehabilitation | Admitting: Registered Nurse

## 2024-02-29 VITALS — BP 124/72 | HR 64 | Ht 64.0 in | Wt 178.0 lb

## 2024-02-29 DIAGNOSIS — G8929 Other chronic pain: Secondary | ICD-10-CM | POA: Diagnosis present

## 2024-02-29 DIAGNOSIS — M170 Bilateral primary osteoarthritis of knee: Secondary | ICD-10-CM | POA: Diagnosis present

## 2024-02-29 DIAGNOSIS — Z5181 Encounter for therapeutic drug level monitoring: Secondary | ICD-10-CM | POA: Insufficient documentation

## 2024-02-29 DIAGNOSIS — M546 Pain in thoracic spine: Secondary | ICD-10-CM | POA: Diagnosis present

## 2024-02-29 DIAGNOSIS — Z79891 Long term (current) use of opiate analgesic: Secondary | ICD-10-CM | POA: Insufficient documentation

## 2024-02-29 DIAGNOSIS — G894 Chronic pain syndrome: Secondary | ICD-10-CM | POA: Insufficient documentation

## 2024-02-29 MED ORDER — METHOCARBAMOL 500 MG PO TABS
500.0000 mg | ORAL_TABLET | Freq: Three times a day (TID) | ORAL | 1 refills | Status: AC | PRN
Start: 2024-02-29 — End: ?

## 2024-02-29 MED ORDER — MORPHINE SULFATE ER 15 MG PO TBCR: 15.0000 mg | EXTENDED_RELEASE_TABLET | Freq: Three times a day (TID) | ORAL | 0 refills | Status: DC

## 2024-02-29 NOTE — Progress Notes (Signed)
 Subjective:    Patient ID: [name], female    DOB: [birth year], 73 y.o.   MRN: [id number]  HPI: [name] is a 73 y.o. female who returns for follow up appointment for chronic pain and medication refill. She  states her pain is located in her mid- back and right hip pain. She rates her pain 3. Her current exercise regime is walking and performing stretching exercises.  [name] Morphine  equivalent is 45.00 MME.   Oral Swab ordered today.     Pain Inventory Average Pain 4 Pain Right Now 3 My pain is constant, burning, dull, and aching  In the last 24 hours, has pain interfered with the following? General activity 3 Relation with others 3 Enjoyment of life 3 What TIME of day is your pain at its worst? evening and night Sleep (in general) Good  Pain is worse with: walking, bending, standing, and some activites Pain improves with: rest, heat/ice, medication, and TENS Relief from Meds: 5  Family History  Problem Relation Age of Onset   Breast cancer Mother    Diabetes Mother    Heart disease Mother    Allergies Mother    Diabetes Father    Heart disease Father    Emphysema Father    Allergies Father    Breast cancer Paternal Aunt    Colon cancer Neg Hx    Esophageal cancer Neg Hx    Rectal cancer Neg Hx    Stomach cancer Neg Hx    Social History   Socioeconomic History   Marital status: Widowed    Spouse name: Not on file   Number of children: Not on file   Years of education: Not on file   Highest education level: Some college, no degree  Occupational History   Occupation: Catering manager   Occupation: retired  Tobacco Use   Smoking status: Never    Passive exposure: Past   Smokeless tobacco: Never   Tobacco comments:    Never smoke 01/13/22  Vaping Use   Vaping status: Never Used  Substance and Sexual Activity   Alcohol  use: Yes    Alcohol /week: 1.0 standard drink of alcohol     Types: 1 Glasses of wine per week    Comment: rare   Drug use: Never    Sexual activity: Not Currently  Other Topics Concern   Not on file  Social History Narrative   Not on file   Social Drivers of Health   Financial Resource Strain: Low Risk  (12/31/2023)   Overall Financial Resource Strain (CARDIA)    Difficulty of Paying Living Expenses: Not very hard  Food Insecurity: No Food Insecurity (12/31/2023)   Hunger Vital Sign    Worried About Running Out of Food in the Last Year: Never true    Ran Out of Food in the Last Year: Never true  Transportation Needs: No Transportation Needs (12/31/2023)   PRAPARE - Administrator, Civil Service (Medical): No    Lack of Transportation (Non-Medical): No  Physical Activity: Unknown (12/31/2023)   Exercise Vital Sign    Days of Exercise per Week: Patient declined    Minutes of Exercise per Session: Not on file  Stress: No Stress Concern Present (12/31/2023)   [employer] of Occupational Health - Occupational Stress Questionnaire    Feeling of Stress: Not at all  Social Connections: Moderately Integrated (12/31/2023)   Social Connection and Isolation Panel    Frequency of Communication with Friends and Family:  Twice a week    Frequency of Social Gatherings with Friends and Family: Twice a week    Attends Religious Services: More than 4 times per year    Active Member of [employer] or Organizations: Yes    Attends Banker Meetings: More than 4 times per year    Marital Status: Widowed   Past Surgical History:  Procedure Laterality Date   79 HOUR PH STUDY N/A 01/04/2016   Procedure: 24 HOUR PH STUDY;  Surgeon: Elspeth Deward Naval, MD;  Location: [facility];  Service: Gastroenterology;  Laterality: N/A;   ABDOMINAL HYSTERECTOMY  1988   ADENOIDECTOMY     ANTERIOR CERVICAL DECOMP/DISCECTOMY FUSION  2009, 1993   first procedure in ~ 1993/Dr Botero   APPENDECTOMY  1988   ATRIAL FIBRILLATION ABLATION N/A 12/16/2021   Procedure: ATRIAL FIBRILLATION ABLATION;  Surgeon: Cindie Ole DASEN, MD;   Location: [facility];  Service: Cardiovascular;  Laterality: N/A;   ATRIAL FIBRILLATION ABLATION N/A 06/29/2023   Procedure: ATRIAL FIBRILLATION ABLATION;  Surgeon: Cindie Ole DASEN, MD;  Location: [facility];  Service: Cardiovascular;  Laterality: N/A;   BACK SURGERY     CARDIOVERSION N/A 04/03/2019   Procedure: CARDIOVERSION;  Surgeon: Maranda Leim DEL, MD;  Location[facility];  Service: Cardiovascular;  Laterality: N/A;   CHOLECYSTECTOMY  2005   ESOPHAGEAL MANOMETRY N/A 01/04/2016   Procedure: ESOPHAGEAL MANOMETRY (EM) 24 HR PH;  Surgeon: Elspeth Deward Naval, MD;  Location: [facility];  Service: Gastroenterology;  Laterality: N/A;   ESOPHAGOGASTRODUODENOSCOPY (EGD) WITH PROPOFOL  N/A 05/17/2016   Procedure: ESOPHAGOGASTRODUODENOSCOPY (EGD) WITH PROPOFOL ;  Surgeon: Elspeth Deward Naval, MD;  Location: [facility];  Service: Gastroenterology;  Laterality: N/A;   FOOT SURGERY Left 08/2022   NASAL SINUS SURGERY  2006   SAVORY DILATION N/A 05/17/2016   Procedure: SAVORY DILATION;  Surgeon: Elspeth Deward Naval, MD;  Location: [facility];  Service: Gastroenterology;  Laterality: N/A;   TEE WITHOUT CARDIOVERSION N/A 04/03/2019   Procedure: TRANSESOPHAGEAL ECHOCARDIOGRAM (TEE)-with DCCV;  Surgeon: Maranda Leim DEL, MD;  Location: [facility] ENDOSCOPY;; EF 55 to 60%.  No LVH.  Normal RV.  Moderate LA dilation.  No thrombus.  Mild RA dilation.  Mild to moderate TR.  Mild aortic valve sclerosis, no stenosis.  Moderately elevated PA pressures (47 mmHg)   TONSILLECTOMY AND ADENOIDECTOMY     TRANSTHORACIC ECHOCARDIOGRAM  2009   Normal LV size and function. EF 60-65%. No regional wall motion and amount is. Mild aortic sclerosis, no stenosis   TUBAL LIGATION  1979   Past Surgical History:  Procedure Laterality Date   66 HOUR PH STUDY N/A 01/04/2016   Procedure: 24 HOUR PH STUDY;  Surgeon: Elspeth Deward Naval, MD;  Location: [facility];  Service: Gastroenterology;   Laterality: N/A;   ABDOMINAL HYSTERECTOMY  1988   ADENOIDECTOMY     ANTERIOR CERVICAL DECOMP/DISCECTOMY FUSION  2009, 1993   first procedure in ~ 1993/Dr Botero   APPENDECTOMY  1988   ATRIAL FIBRILLATION ABLATION N/A 12/16/2021   Procedure: ATRIAL FIBRILLATION ABLATION;  Surgeon: Cindie Ole DASEN, MD;  Location: [facility];  Service: Cardiovascular;  Laterality: N/A;   ATRIAL FIBRILLATION ABLATION N/A 06/29/2023   Procedure: ATRIAL FIBRILLATION ABLATION;  Surgeon: Cindie Ole DASEN, MD;  Location: [facility];  Service: Cardiovascular;  Laterality: N/A;   BACK SURGERY     CARDIOVERSION N/A 04/03/2019   Procedure: CARDIOVERSION;  Surgeon: Maranda Leim DEL, MD;  Location: [facility] ENDOSCOPY;  Service:  Cardiovascular;  Laterality: N/A;   CHOLECYSTECTOMY  2005   ESOPHAGEAL MANOMETRY N/A 01/04/2016   Procedure: ESOPHAGEAL MANOMETRY (EM) 24 HR PH;  Surgeon: Elspeth Deward Naval, MD;  Location: [facility];  Service: Gastroenterology;  Laterality: N/A;   ESOPHAGOGASTRODUODENOSCOPY (EGD) WITH PROPOFOL  N/A 05/17/2016   Procedure: ESOPHAGOGASTRODUODENOSCOPY (EGD) WITH PROPOFOL ;  Surgeon: Elspeth Deward Naval, MD;  Location: [facility];  Service: Gastroenterology;  Laterality: N/A;   FOOT SURGERY Left 08/2022   NASAL SINUS SURGERY  2006   SAVORY DILATION N/A 05/17/2016   Procedure: SAVORY DILATION;  Surgeon: Elspeth Deward Naval, MD;  Location: [facility];  Service: Gastroenterology;  Laterality: N/A;   TEE WITHOUT CARDIOVERSION N/A 04/03/2019   Procedure: TRANSESOPHAGEAL ECHOCARDIOGRAM (TEE)-with DCCV;  Surgeon: Maranda Leim DEL, MD;  Location: [facility] ENDOSCOPY;; EF 55 to 60%.  No LVH.  Normal RV.  Moderate LA dilation.  No thrombus.  Mild RA dilation.  Mild to moderate TR.  Mild aortic valve sclerosis, no stenosis.  Moderately elevated PA pressures (47 mmHg)   TONSILLECTOMY AND ADENOIDECTOMY     TRANSTHORACIC ECHOCARDIOGRAM  2009   Normal LV size and function. EF 60-65%. No  regional wall motion and amount is. Mild aortic sclerosis, no stenosis   TUBAL LIGATION  1979   Past Medical History:  Diagnosis Date   B12 deficiency    Depression    Diabetes mellitus type 2, diet-controlled (HCC) 07/24/2018   Fibromyalgia    Generalized hyperhidrosis 04/17/2014   GERD (gastroesophageal reflux disease)    History of rheumatic fever as a child    Hyperlipemia    Hypertension    HYPOTHYROIDISM 05/29/2007   Irritable bowel syndrome    Migraines    OSA (obstructive sleep apnea) 06/26/2019   Senile calcific aortic valve sclerosis    Aortic Sclerosis-no stenosis   BP 124/72   Pulse 64   Ht 5' 4 (1.626 m)   Wt 178 lb (80.7 kg)   SpO2 98%   BMI 30.55 kg/m   Opioid Risk Score:   Fall Risk Score:  `1  Depression screen [facility] 2/9     12/28/2023   11:42 AM 08/30/2023   11:34 AM 08/22/2023    1:03 PM 08/01/2023   11:21 AM 06/30/2023   11:10 AM 05/31/2023   11:32 AM 05/03/2023   11:19 AM  Depression screen PHQ 2/9  Decreased Interest 0 0 0 0 0 0 0  Down, Depressed, Hopeless 0 0 0 0 0 0 0  PHQ - 2 Score 0 0 0 0 0 0 0     Review of Systems  Constitutional: Negative.   Eyes: Negative.   Respiratory: Negative.    Cardiovascular: Negative.   Gastrointestinal: Negative.   Musculoskeletal:  Positive for arthralgias and back pain.  Skin: Negative.   Allergic/Immunologic: Negative.   Neurological: Negative.   Hematological: Negative.   Psychiatric/Behavioral: Negative.         Objective:   Physical Exam Vitals and nursing note reviewed.  Constitutional:      Appearance: Normal appearance.  Cardiovascular:     Rate and Rhythm: Normal rate and regular rhythm.     Pulses: Normal pulses.     Heart sounds: Normal heart sounds.  Pulmonary:     Effort: Pulmonary effort is normal.     Breath sounds: Normal breath sounds.  Musculoskeletal:     Comments: Normal Muscle Bulk and Muscle Testing Reveals:  Upper Extremities: Full ROM and Muscle Strength 5/5  Thoracic  Paraspinal Tenderness: T-4-T-6  Lower Extremities: Full ROM and Muscle Strength 5/5 Arises from Table with ease Narrow Based  Gait     Skin:    General: Skin is warm and dry.  Neurological:     Mental Status: She is alert and oriented to person, place, and time.  Psychiatric:        Mood and Affect: Mood normal.        Behavior: Behavior normal.          Assessment & Plan:  Cervical postlaminectomy syndrome:/ Cervicalgia/ Cervical Radiculitis/ with neck and shoulder pain/ Chronic Midline Thoracic Pain.  Continue exercise routine and using heat therapy. 10/27/2023. Continue : current medication regimen. Refilled: MS Contin  15 mg one tablet three times a day #90.   We will continue the opioid monitoring program, this consists of regular clinic visits, examinations, urine drug screen, pill counts as well as use of [place]  Controlled Substance Reporting system. A 12 month History has been reviewed on the [place]  Controlled Substance Reporting System on 0904/2025.   2. Fibromyalgia/ Neuropathy: Continue current medication regimen with Gabapentin , activity and exercise regime. 02/29/2024. 3. Left Knee Pain/ Mild Degenerative Changes S/P : on 12/06/2022 with Dr Carilyn.   LEFT Genicular nerve block x 3, Upper medial, Upper lateral , and Lower Medial under fluoroscopic guidance    On 02/09/2023: S/P LEFT KNEE Genicular nerve Radiofrequency neurotomy x 3 nerves, Upper medial, Upper lateral , and Lower Medial under fluoroscopic guidance . Continue current medication regimen with Voltaren  Gel. 02/29/2024. 4. Bilateral  Ankle Pain:  No complaints today. Continue HEP as tolerated. Continue to monitor.02/29/2024. 5. Muscle Spasm: Continue current medication regimen with  Robaxin . Continue to Monitor.02/29/2024.. 6. Opioid Induced Constipation: No complaints Continue to Monitor. 02/29/2024. 7. Midline Low Back Pain: Continue current medication regime, continue HEP as tolerated.  02/29/2024. 8. Right Gluteal Medius Weakness and Atrophy: No complaints Today:Continue with Hip Abduction Exercises as tolerated: Continue to Monitor. 02/29/2024. 9. Right Hip Pain/ Right Greater Trochanteric Tenderness: Denies Falling. RX: X-ray>  Continue to [employer] and Heat Therapy. Continue to Monitor. 02/29/2024  10. Sacroiliac Joint Pain:  No complaints today. S/P Sacroiliac Injection with good relief noted on 11/10/2022. Continue to monitor. 02/29/2024 11. Lumbar Radiculitis: No complaints today.Continue Gabapentin . Continue HEP as tolerated. Continue current medication regimen. Continue to monitor.  02/29/2024.     F/U in 1 month

## 2024-03-04 LAB — DRUG TOX MONITOR 1 W/CONF, ORAL FLD

## 2024-03-04 LAB — DRUG TOX ALC METAB W/CON, ORAL FLD: Alcohol Metabolite: NEGATIVE ng/mL (ref <25)

## 2024-03-25 ENCOUNTER — Ambulatory Visit

## 2024-03-25 VITALS — BP 117/73 | Ht 64.0 in | Wt 178.0 lb

## 2024-03-25 DIAGNOSIS — Z0000 Encounter for general adult medical examination without abnormal findings: Secondary | ICD-10-CM | POA: Diagnosis not present

## 2024-03-25 NOTE — Progress Notes (Signed)
 Because this visit was a virtual/telehealth visit,  certain criteria was not obtained, such a blood pressure, CBG if applicable, and timed get up and go. Any medications not marked as taking were not mentioned during the medication reconciliation part of the visit. Any vitals not documented were not able to be obtained due to this being a telehealth visit or patient was unable to self-report a recent blood pressure reading due to a lack of equipment at home via telehealth. Vitals that have been documented are verbally provided by the patient.  This visit was performed by a medical professional under my direct supervision. I was immediately available for consultation/collaboration. I have reviewed and agree with the Annual Wellness Visit documentation.  Subjective:/   [name] is a 73 y.o. who presents for a Medicare Wellness preventive visit.  As a reminder, Annual Wellness Visits don't include a physical exam, and some assessments may be limited, especially if this visit is performed virtually. We may recommend an in-person follow-up visit with your provider if needed.  Visit Complete: Virtual I connected with  [name] on 03/25/24 by a audio enabled telemedicine application and verified that I am speaking with the correct person using two identifiers.  Patient Location: Home  Provider Location: Home Office  I discussed the limitations of evaluation and management by telemedicine. The patient expressed understanding and agreed to proceed.  Vital Signs: Because this visit was a virtual/telehealth visit, some criteria may be missing or patient reported. Any vitals not documented were not able to be obtained and vitals that have been documented are patient reported.  VideoDeclined- This patient declined Librarian, academic. Therefore the visit was completed with audio only.  Persons Participating in Visit: Patient.  AWV Questionnaire: Yes: Patient  Medicare AWV questionnaire was completed by the patient on  ; I have confirmed that all information answered by patient is correct and no changes since this date.  Cardiac Risk Factors include: advanced age (>53men, >67 women);diabetes mellitus;dyslipidemia;hypertension     Objective:    Today's Vitals   03/25/24 1523  BP: 117/73  Weight: 178 lb (80.7 kg)  Height: 5' 4 (1.626 m)   Body mass index is 30.55 kg/m.     03/25/2024    3:22 PM 06/29/2023    6:02 AM 01/19/2023    1:44 PM 06/16/2022    2:02 PM 12/16/2021    9:25 AM 12/07/2021   11:40 AM 11/17/2021    2:15 PM  Advanced Directives  Does Patient Have a Medical Advance Directive? Yes Yes Yes Yes Yes Yes Yes  Type of Estate agent of [place];Living will Healthcare Power of [place];Living will Healthcare Power of [place];Living will Healthcare Power of [place];Living will Healthcare Power of [place];Living will Healthcare Power of [place];Living will Healthcare Power of Attorney  Does patient want to make changes to medical advance directive? No - Patient declined  No - Patient declined  No - Patient declined    Copy of Healthcare Power of Attorney in Chart? Yes - validated most recent copy scanned in chart (See row information) No - copy requested Yes - validated most recent copy scanned in chart (See row information) Yes - validated most recent copy scanned in chart (See row information)       Current Medications (verified) Outpatient Encounter Medications as of 03/25/2024  Medication Sig   acetaminophen  (TYLENOL ) 650 MG CR tablet Take 650 mg by mouth in the morning and at bedtime.   apixaban  (ELIQUIS )  5 MG TABS tablet TAKE 1 TABLET BY MOUTH TWICE  DAILY   ascorbic acid ([employer]S VITAMIN C) 500 MG tablet 500 mg daily.   b complex vitamins capsule Take 1 capsule by mouth daily.   CALCIUM CITRATE PO Take by mouth daily.   cetirizine (ZYRTEC) 10 MG tablet Take 10 mg by mouth at bedtime.   Cholecalciferol  (VITAMIN D ) 125 MCG (5000 UT) CAPS Take 5,000 Units by mouth every morning.    clindamycin  (CLEOCIN  T) 1 % lotion Apply 1 application topically daily as needed (eczema).    Coenzyme Q-10 100 MG capsule Take 100 mg by mouth every morning.    diclofenac  sodium (VOLTAREN ) 1 % GEL Apply 4 g topically 4 (four) times daily as needed (joint pain).    diltiazem  (CARDIZEM  CD) 240 MG 24 hr capsule TAKE 1 CAPSULE BY MOUTH EVERY DAY   docusate sodium (COLACE) 250 MG capsule Take 250 mg by mouth at bedtime.   fluocinonide (LIDEX) 0.05 % external solution Apply 1 application topically daily as needed (eczema on scalp).    gabapentin  (NEURONTIN ) 300 MG capsule TAKE 1 CAPSULE BY MOUTH 3 TIMES  DAILY   hydroxypropyl methylcellulose / hypromellose (ISOPTO TEARS / GONIOVISC) 2.5 % ophthalmic solution Place 1-2 drops into both eyes 3 (three) times daily as needed for dry eyes (dry/irritated eyes.). Refresh Mega-3   levothyroxine  (SYNTHROID ) 50 MCG tablet TAKE 1 TABLET BY MOUTH DAILY   loratadine (CLARITIN) 10 MG tablet Take 10 mg by mouth every morning.    Magnesium Bisglycinate (MAG GLYCINATE PO) Take 400 mg by mouth at bedtime.   methocarbamol  (ROBAXIN ) 500 MG tablet Take 1 tablet (500 mg total) by mouth every 8 (eight) hours as needed for muscle spasms.   morphine  (MS CONTIN ) 15 MG 12 hr tablet Take 1 tablet (15 mg total) by mouth 3 (three) times daily. Do Not Fill Before 01/26/2024   oxybutynin  (DITROPAN -XL) 10 MG 24 hr tablet TAKE 1 TABLET BY MOUTH AT  BEDTIME   RABEprazole  (ACIPHEX ) 20 MG tablet TAKE 1 TABLET BY MOUTH EVERY DAY IN THE EVENING   RESVERATROL PO Take 200 mg by mouth daily.   tretinoin (RETIN-A) 0.05 % cream Apply 1 application  topically daily as needed (acne).   Wheat Dextrin (BENEFIBER) CHEW Chew 2 tablets by mouth 2 (two) times daily as needed (regularity).   No facility-administered encounter medications on file as of 03/25/2024.    Allergies (verified) Cyclobenzaprine hcl, Naproxen,  Other, Oxycodone hcl, Rizatriptan benzoate, Sumatriptan, Tizanidine , Aleve [naproxen sodium], Aspartame, Clarithromycin, Doxycycline, Duloxetine, Egg [egg-derived products], Keflex  [cephalexin ], Metoprolol , Metronidazole, Monosodium glutamate, Oxycontin [oxycodone hcl], Penicillins, Rosuvastatin, Venlafaxine, Voltaren  [diclofenac  sodium], Septra  [sulfamethoxazole -trimethoprim ], and Tramadol   History: Past Medical History:  Diagnosis Date   B12 deficiency    Depression    Diabetes mellitus type 2, diet-controlled (HCC) 07/24/2018   Fibromyalgia    Generalized hyperhidrosis 04/17/2014   GERD (gastroesophageal reflux disease)    History of rheumatic fever as a child    Hyperlipemia    Hypertension    HYPOTHYROIDISM 05/29/2007   Irritable bowel syndrome    Migraines    OSA (obstructive sleep apnea) 06/26/2019   Senile calcific aortic valve sclerosis    Aortic Sclerosis-no stenosis   Past Surgical History:  Procedure Laterality Date   22 HOUR PH STUDY N/A 01/04/2016   Procedure: 24 HOUR PH STUDY;  Surgeon: Elspeth Deward Naval, MD;  Location: [facility] ENDOSCOPY;  Service: Gastroenterology;  Laterality: N/A;   ABDOMINAL HYSTERECTOMY  1988  ADENOIDECTOMY     ANTERIOR CERVICAL DECOMP/DISCECTOMY FUSION  2009, 1993   first procedure in ~ 1993/Dr Botero   APPENDECTOMY  1988   ATRIAL FIBRILLATION ABLATION N/A 12/16/2021   Procedure: ATRIAL FIBRILLATION ABLATION;  Surgeon: Cindie Ole DASEN, MD;  Location: [facility] INVASIVE CV LAB;  Service: Cardiovascular;  Laterality: N/A;   ATRIAL FIBRILLATION ABLATION N/A 06/29/2023   Procedure: ATRIAL FIBRILLATION ABLATION;  Surgeon: Cindie Ole DASEN, MD;  Location: [facility] INVASIVE CV LAB;  Service: Cardiovascular;  Laterality: N/A;   BACK SURGERY     CARDIOVERSION N/A 04/03/2019   Procedure: CARDIOVERSION;  Surgeon: Maranda Leim DEL, MD;  Location: [facility] ENDOSCOPY;  Service: Cardiovascular;  Laterality: N/A;   CHOLECYSTECTOMY  2005   ESOPHAGEAL MANOMETRY N/A  01/04/2016   Procedure: ESOPHAGEAL MANOMETRY (EM) 24 HR PH;  Surgeon: Elspeth Deward Naval, MD;  Location: [facility] ENDOSCOPY;  Service: Gastroenterology;  Laterality: N/A;   ESOPHAGOGASTRODUODENOSCOPY (EGD) WITH PROPOFOL  N/A 05/17/2016   Procedure: ESOPHAGOGASTRODUODENOSCOPY (EGD) WITH PROPOFOL ;  Surgeon: Elspeth Deward Naval, MD;  Location: [facility];  Service: Gastroenterology;  Laterality: N/A;   FOOT SURGERY Left 08/2022   NASAL SINUS SURGERY  2006   SAVORY DILATION N/A 05/17/2016   Procedure: SAVORY DILATION;  Surgeon: Elspeth Deward Naval, MD;  Location: [facility] ENDOS[facility];  Service: Gastroenterology;  Laterality: N/A;   TEE WITHOUT CARDIOVERSION N/A 04/03/2019   Procedure: TRANSESOPHAGEAL ECHOCARDIOGRAM (TEE)-with DCCV;  Surgeon: Maranda Leim DEL, MD;  Location: [facility] ENDOSCOPY;; EF 55 to 60%.  No LVH.  Normal RV.  Moderate LA dilation.  No thrombus.  Mild RA dilation.  Mild to moderate TR.  Mild aortic valve sclerosis, no stenosis.  Moderately elevated PA pressures (47 mmHg)   TONSILLECTOMY AND ADENOIDECTOMY     TRANSTHORACIC ECHOCARDIOGRAM  2009   Normal LV size and function. EF 60-65%. No regional wall motion and amount is. Mild aortic sclerosis, no stenosis   TUBAL LIGATION  1979   Family History  Problem Relation Age of Onset   Breast cancer Mother    Diabetes Mother    Heart disease Mother    Allergies Mother    Diabetes Father    Heart disease Father    Emphysema Father    Allergies Father    Breast cancer Paternal Aunt    Colon cancer Neg Hx    Esophageal cancer Neg Hx    Rectal cancer Neg Hx    Stomach cancer Neg Hx    Social History   Socioeconomic History   Marital status: Widowed    Spouse name: Not on file   Number of children: Not on file   Years of education: Not on file   Highest education level: Some college, no degree  Occupational History   Occupation: Catering manager   Occupation: retired  Tobacco Use   Smoking status: Never    Passive exposure: Past    Smokeless tobacco: Never   Tobacco comments:    Never smoke 01/13/22  Vaping Use   Vaping status: Never Used  Substance and Sexual Activity   Alcohol  use: Yes    Alcohol /week: 1.0 standard drink of alcohol     Types: 1 Glasses of wine per week    Comment: rare   Drug use: Never   Sexual activity: Not Currently  Other Topics Concern   Not on file  Social History Narrative   Not on file   Social Drivers of Health   Financial Resource Strain: Low Risk  (03/25/2024)   Overall Financial Resource Strain (CARDIA)  Difficulty of Paying Living Expenses: Not very hard  Food Insecurity: No Food Insecurity (03/25/2024)   Hunger Vital Sign    Worried About Running Out of Food in the Last Year: Never true    Ran Out of Food in the Last Year: Never true  Transportation Needs: No Transportation Needs (03/25/2024)   PRAPARE - Administrator, Civil Service (Medical): No    Lack of Transportation (Non-Medical): No  Physical Activity: Insufficiently Active (03/25/2024)   Exercise Vital Sign    Days of Exercise per Week: 6 days    Minutes of Exercise per Session: 20 min  Stress: No Stress Concern Present (03/25/2024)   [employer] of Occupational Health - Occupational Stress Questionnaire    Feeling of Stress: Not at all  Social Connections: Moderately Integrated (03/25/2024)   Social Connection and Isolation Panel    Frequency of Communication with Friends and Family: Twice a week    Frequency of Social Gatherings with Friends and Family: Twice a week    Attends Religious Services: More than 4 times per year    Active Member of [employer] or Organizations: Yes    Attends Banker Meetings: More than 4 times per year    Marital Status: Widowed    Tobacco Counseling Counseling given: Not Answered Tobacco comments: Never smoke 01/13/22    Clinical Intake:  Pre-visit preparation completed: Yes  Pain : No/denies pain     BMI - recorded: 30.55 Nutritional  Status: BMI > 30  Obese Nutritional Risks: None Diabetes: Yes CBG done?: No Did pt. bring in CBG monitor from home?: No  Lab Results  Component Value Date   HGBA1C 6.1 12/25/2023   HGBA1C 6.1 10/03/2022   HGBA1C 6.6 (H) 08/23/2021     How often do you need to have someone help you when you read instructions, pamphlets, or other written materials from your doctor or pharmacy?: 1 - Never  Interpreter Needed?: No  Information entered by :: Jheremy Boger,CMA   Activities of Daily Living     03/24/2024    3:34 PM 06/29/2023    5:56 AM  In your present state of health, do you have any difficulty performing the following activities:  Hearing? 0 0  Vision? 0 0  Difficulty concentrating or making decisions? 0 0  Walking or climbing stairs? 0   Dressing or bathing? 0   Doing errands, shopping? 0   Preparing Food and eating ? N   Using the Toilet? N   In the past six months, have you accidently leaked urine? Y   Do you have problems with loss of bowel control? N   Managing your Medications? N   Managing your Finances? N   Housekeeping or managing your Housekeeping? N     Patient Care Team: Watt Mirza, MD as PCP - General Cindie Ole DASEN, MD as PCP - Electrophysiology (Cardiology) Anner Alm ORN, MD as PCP - Cardiology (Cardiology) Carilyn Prentice BRAVO, MD as Consulting Physician (Physical Medicine and Rehabilitation) Armbruster, Elspeth SQUIBB, MD as Consulting Physician (Gastroenterology) Anner Alm ORN, MD as Consulting Physician (Cardiology) Fate Morna SAILOR, [facility] (Inactive) as Pharmacist (Pharmacist) Hope Almarie ORN, NP as Nurse Practitioner (Pulmonary Disease) Estelle Fallow (Optometry)  I have updated your Care Teams any recent Medical Services you may have received from other providers in the past year.     Assessment:   This is a routine wellness examination for [place].  Hearing/Vision screen Hearing Screening - Comments:: No  difficulties  Vision Screening - Comments:: Patient has over the counter readers   Goals Addressed             This Visit's Progress    Patient Stated       To be successful at part time job       Depression Screen     03/25/2024    3:26 PM 02/29/2024   11:38 AM 12/28/2023   11:42 AM 08/30/2023   11:34 AM 08/22/2023    1:03 PM 08/01/2023   11:21 AM 06/30/2023   11:10 AM  PHQ 2/9 Scores  PHQ - 2 Score 0 0 0 0 0 0 0  PHQ- 9 Score 0          Fall Risk     03/24/2024    3:34 PM 02/29/2024   11:38 AM 12/28/2023   11:42 AM 08/30/2023   11:34 AM 08/22/2023    1:03 PM  Fall Risk   Falls in the past year? 1 1 1  0 0  Number falls in past yr: 0 0 0 0 0  Comment   3 wks ago    Injury with Fall? 0 1 1 0 0  Comment   no tx needed    Risk for fall due to : History of fall(s) Impaired balance/gait     Follow up Falls evaluation completed;Education provided;Falls prevention discussed        MEDICARE RISK AT HOME:  Medicare Risk at Home Any stairs in or around the home?: (Patient-Rptd) Yes If so, are there any without handrails?: (Patient-Rptd) No Home free of loose throw rugs in walkways, pet beds, electrical cords, etc?: (Patient-Rptd) Yes Adequate lighting in your home to reduce risk of falls?: (Patient-Rptd) Yes Life alert?: (Patient-Rptd) No Use of a cane, walker or w/c?: (Patient-Rptd) No Grab bars in the bathroom?: (Patient-Rptd) Yes Shower chair or bench in shower?: (Patient-Rptd) Yes Elevated toilet seat or a handicapped toilet?: (Patient-Rptd) Yes  TIMED UP AND GO:  Was the test performed?  No  Cognitive Function: 6CIT completed    02/12/2020    1:23 PM 01/18/2019   11:29 AM 01/10/2018   12:08 PM 01/02/2017    1:23 PM  MMSE - Mini Mental State Exam  Orientation to time 5 5 5 5    Orientation to Place 5 5 5 5    Registration 3 3 3 3    Attention/ Calculation 5 5 0 0   Recall 3 3 3 3    Language- name 2 objects  0 0 0   Language- repeat 1 1 1 1   Language- follow 3 step command   0 3 3   Language- read & follow direction  0 0 0   Write a sentence  0 0 0   Copy design  0 0 0   Total score  22 20 20       Data saved with a previous flowsheet row definition        03/25/2024    3:27 PM 01/19/2023    1:48 PM 06/16/2022    2:04 PM  6CIT Screen  What Year? 0 points 0 points 0 points  What month? 0 points 0 points 0 points  What time? 0 points 0 points 0 points  Count back from 20 0 points 0 points   Months in reverse 0 points 0 points   Repeat phrase 0 points 0 points   Total Score 0 points 0 points     Immunizations Immunization History  Administered Date(s) Administered  PFIZER(Purple Top)SARS-COV-2 Vaccination 08/22/2019, 09/18/2019   Pneumococcal Conjugate-13 01/02/2017   Pneumococcal Polysaccharide-23 05/06/2013, 07/25/2018   Td 06/28/2003, 10/16/2013   Zoster Recombinant(Shingrix) 05/04/2023   Zoster, Live 11/07/2012    Screening Tests Health Maintenance  Topic Date Due   COVID-19 Vaccine (3 - Pfizer risk series) 10/16/2019   OPHTHALMOLOGY EXAM  10/29/2021   Zoster Vaccines- Shingrix (2 of 2) 06/29/2023   FOOT EXAM  10/10/2023   DTaP/Tdap/Td (3 - Tdap) 10/17/2023   Influenza Vaccine  01/26/2024   HEMOGLOBIN A1C  06/25/2024   Diabetic kidney evaluation - eGFR measurement  12/24/2024   Diabetic kidney evaluation - Urine ACR  12/24/2024   Medicare Annual Wellness (AWV)  03/25/2025   Mammogram  02/18/2026   Colonoscopy  10/11/2028   Pneumococcal Vaccine: 50+ Years  Completed   DEXA SCAN  Completed   Hepatitis C Screening  Completed   HPV VACCINES  Aged Out   Meningococcal B Vaccine  Aged Out   Fecal DNA (Cologuard)  Discontinued    Health Maintenance Items Addressed:patient declined    Additional Screening:  Vision Screening: Recommended annual ophthalmology exams for early detection of glaucoma and other disorders of the eye. Is the patient up to date with their annual eye exam?  No  Who is the provider or what is the name of the  office in which the patient attends annual eye exams?   Dental Screening: Recommended annual dental exams for proper oral hygiene  Community Resource Referral / Chronic Care Management: CRR required this visit?  No   CCM required this visit?  No   Plan:    I have personally reviewed and noted the following in the patient's chart:   Medical and social history Use of alcohol , tobacco or illicit drugs  Current medications and supplements including opioid prescriptions. Patient is not currently taking opioid prescriptions. Functional ability and status Nutritional status Physical activity Advanced directives List of other physicians Hospitalizations, surgeries, and ER visits in previous 12 months Vitals Screenings to include cognitive, depression, and falls Referrals and appointments  In addition, I have reviewed and discussed with patient certain preventive protocols, quality metrics, and best practice recommendations. A written personalized care plan for preventive services as well as general preventive health recommendations were provided to patient.   Lyle MARLA Right, [place]   03/25/2024   After Visit Summary: (MyChart) Due to this being a telephonic visit, the after visit summary with patients personalized plan was offered to patient via MyChart   Notes: Nothing significant to report at this time.

## 2024-03-25 NOTE — Patient Instructions (Signed)
 [name],  Thank you for taking the time for your Medicare Wellness Visit. I appreciate your continued commitment to your health goals. Please review the care plan we discussed, and feel free to reach out if I can assist you further.  Medicare recommends these wellness visits once per year to help you and your care team stay ahead of potential health issues. These visits are designed to focus on prevention, allowing your provider to concentrate on managing your acute and chronic conditions during your regular appointments.  Please note that Annual Wellness Visits do not include a physical exam. Some assessments may be limited, especially if the visit was conducted virtually. If needed, we may recommend a separate in-person follow-up with your provider.  Ongoing Care Seeing your primary care provider every 3 to 6 months helps us  monitor your health and provide consistent, personalized care.   Referrals If a referral was made during today's visit and you haven't received any updates within two weeks, please contact the referred provider directly to check on the status.  Recommended Screenings:  Health Maintenance  Topic Date Due   COVID-19 Vaccine (3 - Pfizer risk series) 10/16/2019   Eye exam for diabetics  10/29/2021   Zoster (Shingles) Vaccine (2 of 2) 06/29/2023   Complete foot exam   10/10/2023   DTaP/Tdap/Td vaccine (3 - Tdap) 10/17/2023   Flu Shot  01/26/2024   Hemoglobin A1C  06/25/2024   Yearly kidney function blood test for diabetes  12/24/2024   Yearly kidney health urinalysis for diabetes  12/24/2024   Medicare Annual Wellness Visit  03/25/2025   Breast Cancer Screening  02/18/2026   Colon Cancer Screening  10/11/2028   Pneumococcal Vaccine for age over 9  Completed   DEXA scan (bone density measurement)  Completed   Hepatitis C Screening  Completed   HPV Vaccine  Aged Out   Meningitis B Vaccine  Aged Out   Cologuard (Stool DNA test)  Discontinued       03/25/2024     3:22 PM  Advanced Directives  Does Patient Have a Medical Advance Directive? Yes  Type of Estate agent of [place];Living will  Does patient want to make changes to medical advance directive? No - Patient declined  Copy of Healthcare Power of Attorney in Chart? Yes - validated most recent copy scanned in chart (See row information)   Advance Care Planning is important because it: Ensures you receive medical care that aligns with your values, goals, and preferences. Provides guidance to your family and loved ones, reducing the emotional burden of decision-making during critical moments.  Vision: Annual vision screenings are recommended for early detection of glaucoma, cataracts, and diabetic retinopathy. These exams can also reveal signs of chronic conditions such as diabetes and high blood pressure.  Dental: Annual dental screenings help detect early signs of oral cancer, gum disease, and other conditions linked to overall health, including heart disease and diabetes.  Please see the attached documents for additional preventive care recommendations.

## 2024-03-27 NOTE — Progress Notes (Unsigned)
 Subjective:    Patient ID: [name], female    DOB: [birth year], 73 y.o.   MRN: [id number]  HPI: [name] is a 73 y.o. female who returns for follow up appointment for chronic pain and medication refill. She states her pain is located in her mid- lower back and left knee pain . Also reports right elbow pain at times. She  rates her pain 4. Her current exercise regime is walking and performing stretching exercises.  [name] Morphine  equivalent is 45.00 MME.   Last Oral Swab was Performed on 02/29/2024, it was consistent.    Pain Inventory Average Pain 4 Pain Right Now 4 My pain is constant, burning, dull, and aching  In the last 24 hours, has pain interfered with the following? General activity 3 Relation with others 3 Enjoyment of life 3 What TIME of day is your pain at its worst? evening and night Sleep (in general) Good  Pain is worse with: walking, bending, standing, and some activites Pain improves with: rest, heat/ice, and medication Relief from Meds: 5  Family History  Problem Relation Age of Onset   Breast cancer Mother    Diabetes Mother    Heart disease Mother    Allergies Mother    Diabetes Father    Heart disease Father    Emphysema Father    Allergies Father    Breast cancer Paternal Aunt    Colon cancer Neg Hx    Esophageal cancer Neg Hx    Rectal cancer Neg Hx    Stomach cancer Neg Hx    Social History   Socioeconomic History   Marital status: Widowed    Spouse name: Not on file   Number of children: Not on file   Years of education: Not on file   Highest education level: Some college, no degree  Occupational History   Occupation: Catering manager   Occupation: retired  Tobacco Use   Smoking status: Never    Passive exposure: Past   Smokeless tobacco: Never   Tobacco comments:    Never smoke 01/13/22  Vaping Use   Vaping status: Never Used  Substance and Sexual Activity   Alcohol  use: Yes    Alcohol /week: 1.0 standard drink of  alcohol     Types: 1 Glasses of wine per week    Comment: rare   Drug use: Never   Sexual activity: Not Currently  Other Topics Concern   Not on file  Social History Narrative   Not on file   Social Drivers of Health   Financial Resource Strain: Low Risk  (03/25/2024)   Overall Financial Resource Strain (CARDIA)    Difficulty of Paying Living Expenses: Not very hard  Food Insecurity: No Food Insecurity (03/25/2024)   Hunger Vital Sign    Worried About Running Out of Food in the Last Year: Never true    Ran Out of Food in the Last Year: Never true  Transportation Needs: No Transportation Needs (03/25/2024)   PRAPARE - Administrator, Civil Service (Medical): No    Lack of Transportation (Non-Medical): No  Physical Activity: Insufficiently Active (03/25/2024)   Exercise Vital Sign    Days of Exercise per Week: 6 days    Minutes of Exercise per Session: 20 min  Stress: No Stress Concern Present (03/25/2024)   [employer] of Occupational Health - Occupational Stress Questionnaire    Feeling of Stress: Not at all  Social Connections: Moderately Integrated (03/25/2024)   Social Connection  and Isolation Panel    Frequency of Communication with Friends and Family: Twice a week    Frequency of Social Gatherings with Friends and Family: Twice a week    Attends Religious Services: More than 4 times per year    Active Member of [employer] or Organizations: Yes    Attends Banker Meetings: More than 4 times per year    Marital Status: Widowed   Past Surgical History:  Procedure Laterality Date   32 HOUR PH STUDY N/A 01/04/2016   Procedure: 24 HOUR PH STUDY;  Surgeon: Elspeth Deward Naval, MD;  Location: [facility];  Service: Gastroenterology;  Laterality: N/A;   ABDOMINAL HYSTERECTOMY  1988   ADENOIDECTOMY     ANTERIOR CERVICAL DECOMP/DISCECTOMY FUSION  2009, 1993   first procedure in ~ 1993/Dr Botero   APPENDECTOMY  1988   ATRIAL FIBRILLATION ABLATION N/A  12/16/2021   Procedure: ATRIAL FIBRILLATION ABLATION;  Surgeon: Cindie Ole DASEN, MD;  Location: [facility];  Service: Cardiovascular;  Laterality: N/A;   ATRIAL FIBRILLATION ABLATION N/A 06/29/2023   Procedure: ATRIAL FIBRILLATION ABLATION;  Surgeon: Cindie Ole DASEN, MD;  Location: [facility];  Service: Cardiovascular;  Laterality: N/A;   BACK SURGERY     CARDIOVERSION N/A 04/03/2019   Procedure: CARDIOVERSION;  Surgeon: Maranda Leim DEL, MD;  Location: [facility];  Service: Cardiovascular;  Laterality: N/A;   CHOLECYSTECTOMY  2005   ESOPHAGEAL MANOMETRY N/A 01/04/2016   Procedure: ESOPHAGEAL MANOMETRY (EM) 24 HR PH;  Surgeon: Elspeth Deward Naval, MD;  Location: [facility];  Service: Gastroenterology;  Laterality: N/A;   ESOPHAGOGASTRODUODENOSCOPY (EGD) WITH PROPOFOL  N/A 05/17/2016   Procedure: ESOPHAGOGASTRODUODENOSCOPY (EGD) WITH PROPOFOL ;  Surgeon: Elspeth Deward Naval, MD;  Location: [facility];  Service: Gastroenterology;  Laterality: N/A;   FOOT SURGERY Left 08/2022   NASAL SINUS SURGERY  2006   SAVORY DILATION N/A 05/17/2016   Procedure: SAVORY DILATION;  Surgeon: Elspeth Deward Naval, MD;  Location: [facility];  Service: Gastroenterology;  Laterality: N/A;   TEE WITHOUT CARDIOVERSION N/A 04/03/2019   Procedure: TRANSESOPHAGEAL ECHOCARDIOGRAM (TEE)-with DCCV;  Surgeon: Maranda Leim DEL, MD;  Location: [facility] ENDOSCOPY;; EF 55 to 60%.  No LVH.  Normal RV.  Moderate LA dilation.  No thrombus.  Mild RA dilation.  Mild to moderate TR.  Mild aortic valve sclerosis, no stenosis.  Moderately elevated PA pressures (47 mmHg)   TONSILLECTOMY AND ADENOIDECTOMY     TRANSTHORACIC ECHOCARDIOGRAM  2009   Normal LV size and function. EF 60-65%. No regional wall motion and amount is. Mild aortic sclerosis, no stenosis   TUBAL LIGATION  1979   Past Surgical History:  Procedure Laterality Date   80 HOUR PH STUDY N/A 01/04/2016   Procedure: 24 HOUR PH STUDY;  Surgeon:  Elspeth Deward Naval, MD;  Location: [facility];  Service: Gastroenterology;  Laterality: N/A;   ABDOMINAL HYSTERECTOMY  1988   ADENOIDECTOMY     ANTERIOR CERVICAL DECOMP/DISCECTOMY FUSION  2009, 1993   first procedure in ~ 1993/Dr Botero   APPENDECTOMY  1988   ATRIAL FIBRILLATION ABLATION N/A 12/16/2021   Procedure: ATRIAL FIBRILLATION ABLATION;  Surgeon: Cindie Ole DASEN, MD;  Location: [facility];  Service: Cardiovascular;  Laterality: N/A;   ATRIAL FIBRILLATION ABLATION N/A 06/29/2023   Procedure: ATRIAL FIBRILLATION ABLATION;  Surgeon: Cindie Ole DASEN, MD;  Location: [facility];  Service: Cardiovascular;  Laterality: N/A;   BACK SURGERY     CARDIOVERSION N/A 04/03/2019   Procedure:  CARDIOVERSION;  Surgeon: Maranda Leim DEL, MD;  Location: [facility] ENDOSCOPY;  Service: Cardiovascular;  Laterality: N/A;   CHOLECYSTECTOMY  2005   ESOPHAGEAL MANOMETRY N/A 01/04/2016   Procedure: ESOPHAGEAL MANOMETRY (EM) 24 HR PH;  Surgeon: Elspeth Deward Naval, MD;  Location: [facility];  Service: Gastroenterology;  Laterality: N/A;   ESOPHAGOGASTRODUODENOSCOPY (EGD) WITH PROPOFOL  N/A 05/17/2016   Procedure: ESOPHAGOGASTRODUODENOSCOPY (EGD) WITH PROPOFOL ;  Surgeon: Elspeth Deward Naval, MD;  Location: [facility];  Service: Gastroenterology;  Laterality: N/A;   FOOT SURGERY Left 08/2022   NASAL SINUS SURGERY  2006   SAVORY DILATION N/A 05/17/2016   Procedure: SAVORY DILATION;  Surgeon: Elspeth Deward Naval, MD;  Location: [facility];  Service: Gastroenterology;  Laterality: N/A;   TEE WITHOUT CARDIOVERSION N/A 04/03/2019   Procedure: TRANSESOPHAGEAL ECHOCARDIOGRAM (TEE)-with DCCV;  Surgeon: Maranda Leim DEL, MD;  Location: [facility] ENDOSCOPY;; EF 55 to 60%.  No LVH.  Normal RV.  Moderate LA dilation.  No thrombus.  Mild RA dilation.  Mild to moderate TR.  Mild aortic valve sclerosis, no stenosis.  Moderately elevated PA pressures (47 mmHg)   TONSILLECTOMY AND ADENOIDECTOMY      TRANSTHORACIC ECHOCARDIOGRAM  2009   Normal LV size and function. EF 60-65%. No regional wall motion and amount is. Mild aortic sclerosis, no stenosis   TUBAL LIGATION  1979   Past Medical History:  Diagnosis Date   B12 deficiency    Depression    Diabetes mellitus type 2, diet-controlled (HCC) 07/24/2018   Fibromyalgia    Generalized hyperhidrosis 04/17/2014   GERD (gastroesophageal reflux disease)    History of rheumatic fever as a child    Hyperlipemia    Hypertension    HYPOTHYROIDISM 05/29/2007   Irritable bowel syndrome    Migraines    OSA (obstructive sleep apnea) 06/26/2019   Senile calcific aortic valve sclerosis    Aortic Sclerosis-no stenosis   There were no vitals taken for this visit.  Opioid Risk Score:   Fall Risk Score:  `1  Depression screen [facility] 2/9     03/25/2024    3:26 PM 02/29/2024   11:38 AM 12/28/2023   11:42 AM 08/30/2023   11:34 AM 08/22/2023    1:03 PM 08/01/2023   11:21 AM 06/30/2023   11:10 AM  Depression screen PHQ 2/9  Decreased Interest 0 0 0 0 0 0 0  Down, Depressed, Hopeless 0 0 0 0 0 0 0  PHQ - 2 Score 0 0 0 0 0 0 0  Altered sleeping 0        Tired, decreased energy 0        Change in appetite 0        Feeling bad or failure about yourself  0        Trouble concentrating 0        Moving slowly or fidgety/restless 0        Suicidal thoughts 0        PHQ-9 Score 0        Difficult doing work/chores Not difficult at all          Review of Systems  Musculoskeletal:  Positive for back pain and neck pain.       Pain in the: left knee, right elbow, right hip  All other systems reviewed and are negative.      Objective:   Physical Exam Vitals and nursing note reviewed.  Constitutional:      Appearance: Normal appearance.  Cardiovascular:  Rate and Rhythm: Normal rate and regular rhythm.     Pulses: Normal pulses.     Heart sounds: Normal heart sounds.  Pulmonary:     Effort: Pulmonary effort is normal.     Breath sounds: Normal  breath sounds.  Musculoskeletal:     Comments: Normal Muscle Bulk and Muscle Testing Reveals:  Upper Extremities: Full ROM and Muscle Strength 5/5  Lumbar Paraspinal Tenderness: L-4-L-5 Mainly Right Side  Lower Extremities Full ROM and Muscle Strength 5/5 Arises from Chair slowly Narrow Based   Gait     Skin:    General: Skin is warm and dry.  Neurological:     Mental Status: She is alert and oriented to person, place, and time.  Psychiatric:        Mood and Affect: Mood normal.        Behavior: Behavior normal.          Assessment & Plan:  Cervical postlaminectomy syndrome:/ Cervicalgia/ Cervical Radiculitis/ with neck and shoulder pain/ Chronic Midline Thoracic Pain.  Continue exercise routine and using heat therapy. 03/28/2024. Continue : current medication regimen. Refilled: MS Contin  15 mg one tablet three times a day #90.   We will continue the opioid monitoring program, this consists of regular clinic visits, examinations, urine drug screen, pill counts as well as use of [place]  Controlled Substance Reporting system. A 12 month History has been reviewed on the [place]  Controlled Substance Reporting System on 1002/2025.   2. Fibromyalgia/ Neuropathy: Continue current medication regimen with Gabapentin , activity and exercise regime. 03/28/2024. 3. Left Knee Pain/ Mild Degenerative Changes S/P : on 12/06/2022 with Dr Carilyn.   LEFT Genicular nerve block x 3, Upper medial, Upper lateral , and Lower Medial under fluoroscopic guidance    On 02/09/2023: S/P LEFT KNEE Genicular nerve Radiofrequency neurotomy x 3 nerves, Upper medial, Upper lateral , and Lower Medial under fluoroscopic guidance . Continue current medication regimen with Voltaren  Gel. 03/28/2024. 4. Bilateral  Ankle Pain:  No complaints today. Continue HEP as tolerated. Continue to monitor.03/28/2024. 5. Muscle Spasm: Continue current medication regimen with  Robaxin . Continue to Monitor.03/28/2024.. 6.  Opioid Induced Constipation: No complaints Continue to Monitor. 03/28/2024. 7. Midline Low Back Pain: Continue current medication regime, continue HEP as tolerated. 03/28/2024. 8. Right Gluteal Medius Weakness and Atrophy: No complaints Today:Continue with Hip Abduction Exercises as tolerated: Continue to Monitor. 03/28/2024. 9. Right Hip Pain/ Right Greater Trochanteric Tenderness: No complaints today. Denies Falling. RX: X-ray>  Continue to [employer] and Heat Therapy. Continue to Monitor. 03/28/2024  10. Sacroiliac Joint Pain:  S/P Sacroiliac Injection with good relief noted on 11/10/2022. Continue to monitor. 03/28/2024 11. Lumbar Radiculitis: No complaints today.Continue Gabapentin . Continue HEP as tolerated. Continue current medication regimen. Continue to monitor.  03/28/2024.  12. Right epicondylitis: Continue current medication regimen. Continue to monitor.   F/U in 1 month

## 2024-03-28 ENCOUNTER — Encounter: Payer: Self-pay | Admitting: Registered Nurse

## 2024-03-28 ENCOUNTER — Encounter: Attending: Physical Medicine & Rehabilitation | Admitting: Registered Nurse

## 2024-03-28 VITALS — BP 117/69 | HR 60 | Ht 64.0 in | Wt 177.0 lb

## 2024-03-28 DIAGNOSIS — M546 Pain in thoracic spine: Secondary | ICD-10-CM | POA: Insufficient documentation

## 2024-03-28 DIAGNOSIS — G8929 Other chronic pain: Secondary | ICD-10-CM | POA: Diagnosis present

## 2024-03-28 DIAGNOSIS — Z5181 Encounter for therapeutic drug level monitoring: Secondary | ICD-10-CM | POA: Diagnosis present

## 2024-03-28 DIAGNOSIS — M7701 Medial epicondylitis, right elbow: Secondary | ICD-10-CM | POA: Insufficient documentation

## 2024-03-28 DIAGNOSIS — Z79891 Long term (current) use of opiate analgesic: Secondary | ICD-10-CM | POA: Insufficient documentation

## 2024-03-28 DIAGNOSIS — M170 Bilateral primary osteoarthritis of knee: Secondary | ICD-10-CM | POA: Diagnosis present

## 2024-03-28 DIAGNOSIS — G894 Chronic pain syndrome: Secondary | ICD-10-CM | POA: Insufficient documentation

## 2024-03-28 DIAGNOSIS — M1712 Unilateral primary osteoarthritis, left knee: Secondary | ICD-10-CM | POA: Insufficient documentation

## 2024-03-28 DIAGNOSIS — M533 Sacrococcygeal disorders, not elsewhere classified: Secondary | ICD-10-CM | POA: Insufficient documentation

## 2024-03-28 MED ORDER — MORPHINE SULFATE ER 15 MG PO TBCR: 15.0000 mg | EXTENDED_RELEASE_TABLET | Freq: Three times a day (TID) | ORAL | 0 refills | Status: DC

## 2024-04-30 ENCOUNTER — Encounter: Attending: Physical Medicine & Rehabilitation | Admitting: Registered Nurse

## 2024-04-30 ENCOUNTER — Encounter: Payer: Self-pay | Admitting: Registered Nurse

## 2024-04-30 VITALS — BP 121/71 | HR 66 | Ht 64.0 in | Wt 175.0 lb

## 2024-04-30 DIAGNOSIS — M1712 Unilateral primary osteoarthritis, left knee: Secondary | ICD-10-CM | POA: Diagnosis present

## 2024-04-30 DIAGNOSIS — G894 Chronic pain syndrome: Secondary | ICD-10-CM | POA: Diagnosis present

## 2024-04-30 DIAGNOSIS — M170 Bilateral primary osteoarthritis of knee: Secondary | ICD-10-CM | POA: Diagnosis present

## 2024-04-30 DIAGNOSIS — M7701 Medial epicondylitis, right elbow: Secondary | ICD-10-CM | POA: Insufficient documentation

## 2024-04-30 DIAGNOSIS — G8929 Other chronic pain: Secondary | ICD-10-CM | POA: Diagnosis present

## 2024-04-30 DIAGNOSIS — M546 Pain in thoracic spine: Secondary | ICD-10-CM | POA: Diagnosis not present

## 2024-04-30 MED ORDER — MORPHINE SULFATE ER 15 MG PO TBCR: 15.0000 mg | EXTENDED_RELEASE_TABLET | Freq: Three times a day (TID) | ORAL | 0 refills | Status: DC

## 2024-04-30 NOTE — Progress Notes (Signed)
 Subjective:    Patient ID: [name], female    DOB: May 17, 1951, 73 y.o.   MRN: [id number]  HPI: [name] is a 73 y.o. female who returns for follow up appointment for chronic pain and medication refill. She states her pain is located in her right elbow, mid- back and left knee pain. She rates her pain 3. Her current exercise regime is walking and performing stretching exercises.  [name] Morphine  equivalent is 45.00 MME.   Last Oral Swab was Performed on 02/29/2024, it was consistent.    Pain Inventory Average Pain 4 Pain Right Now 3 My pain is constant, burning, dull, and aching  In the last 24 hours, has pain interfered with the following? General activity 4 Relation with others 4 Enjoyment of life 4 What TIME of day is your pain at its worst? evening and night Sleep (in general) Good  Pain is worse with: walking, bending, standing, and some activites Pain improves with: rest, heat/ice, and medication Relief from Meds: 6  Family History  Problem Relation Age of Onset   Breast cancer Mother    Diabetes Mother    Heart disease Mother    Allergies Mother    Diabetes Father    Heart disease Father    Emphysema Father    Allergies Father    Breast cancer Paternal Aunt    Colon cancer Neg Hx    Esophageal cancer Neg Hx    Rectal cancer Neg Hx    Stomach cancer Neg Hx    Social History   Socioeconomic History   Marital status: Widowed    Spouse name: Not on file   Number of children: Not on file   Years of education: Not on file   Highest education level: Some college, no degree  Occupational History   Occupation: Catering Manager   Occupation: retired  Tobacco Use   Smoking status: Never    Passive exposure: Past   Smokeless tobacco: Never   Tobacco comments:    Never smoke 01/13/22  Vaping Use   Vaping status: Never Used  Substance and Sexual Activity   Alcohol  use: Yes    Alcohol /week: 1.0 standard drink of alcohol     Types: 1 Glasses of wine per  week    Comment: rare   Drug use: Never   Sexual activity: Not Currently  Other Topics Concern   Not on file  Social History Narrative   Not on file   Social Drivers of Health   Financial Resource Strain: Low Risk  (03/25/2024)   Overall Financial Resource Strain (CARDIA)    Difficulty of Paying Living Expenses: Not very hard  Food Insecurity: No Food Insecurity (03/25/2024)   Hunger Vital Sign    Worried About Running Out of Food in the Last Year: Never true    Ran Out of Food in the Last Year: Never true  Transportation Needs: No Transportation Needs (03/25/2024)   PRAPARE - Administrator, Civil Service (Medical): No    Lack of Transportation (Non-Medical): No  Physical Activity: Insufficiently Active (03/25/2024)   Exercise Vital Sign    Days of Exercise per Week: 6 days    Minutes of Exercise per Session: 20 min  Stress: No Stress Concern Present (03/25/2024)   [employer] of Occupational Health - Occupational Stress Questionnaire    Feeling of Stress: Not at all  Social Connections: Moderately Integrated (03/25/2024)   Social Connection and Isolation Panel    Frequency of  Communication with Friends and Family: Twice a week    Frequency of Social Gatherings with Friends and Family: Twice a week    Attends Religious Services: More than 4 times per year    Active Member of [employer] or Organizations: Yes    Attends Banker Meetings: More than 4 times per year    Marital Status: Widowed   Past Surgical History:  Procedure Laterality Date   52 HOUR PH STUDY N/A 01/04/2016   Procedure: 24 HOUR PH STUDY;  Surgeon: Elspeth Deward Naval, MD;  Location: [facility];  Service: Gastroenterology;  Laterality: N/A;   ABDOMINAL HYSTERECTOMY  1988   ADENOIDECTOMY     ANTERIOR CERVICAL DECOMP/DISCECTOMY FUSION  2009, 1993   first procedure in ~ 1993/Dr Botero   APPENDECTOMY  1988   ATRIAL FIBRILLATION ABLATION N/A 12/16/2021   Procedure: ATRIAL FIBRILLATION  ABLATION;  Surgeon: Cindie Ole DASEN, MD;  Location: [facility];  Service: Cardiovascular;  Laterality: N/A;   ATRIAL FIBRILLATION ABLATION N/A 06/29/2023   Procedure: ATRIAL FIBRILLATION ABLATION;  Surgeon: Cindie Ole DASEN, MD;  Location: [facility];  Service: Cardiovascular;  Laterality: N/A;   BACK SURGERY     CARDIOVERSION N/A 04/03/2019   Procedure: CARDIOVERSION;  Surgeon: Maranda Leim DEL, MD;  Location: [facility];  Service: Cardiovascular;  Laterality: N/A;   CHOLECYSTECTOMY  2005   ESOPHAGEAL MANOMETRY N/A 01/04/2016   Procedure: ESOPHAGEAL MANOMETRY (EM) 24 HR PH;  Surgeon: Elspeth Deward Naval, MD;  Location: [facility];  Service: Gastroenterology;  Laterality: N/A;   ESOPHAGOGASTRODUODENOSCOPY (EGD) WITH PROPOFOL  N/A 05/17/2016   Procedure: ESOPHAGOGASTRODUODENOSCOPY (EGD) WITH PROPOFOL ;  Surgeon: Elspeth Deward Naval, MD;  Location: [facility];  Service: Gastroenterology;  Laterality: N/A;   FOOT SURGERY Left 08/2022   NASAL SINUS SURGERY  2006   SAVORY DILATION N/A 05/17/2016   Procedure: SAVORY DILATION;  Surgeon: Elspeth Deward Naval, MD;  Location: [facility];  Service: Gastroenterology;  Laterality: N/A;   TEE WITHOUT CARDIOVERSION N/A 04/03/2019   Procedure: TRANSESOPHAGEAL ECHOCARDIOGRAM (TEE)-with DCCV;  Surgeon: Maranda Leim DEL, MD;  Location: [facility] ENDOSCOPY;; EF 55 to 60%.  No LVH.  Normal RV.  Moderate LA dilation.  No thrombus.  Mild RA dilation.  Mild to moderate TR.  Mild aortic valve sclerosis, no stenosis.  Moderately elevated PA pressures (47 mmHg)   TONSILLECTOMY AND ADENOIDECTOMY     TRANSTHORACIC ECHOCARDIOGRAM  2009   Normal LV size and function. EF 60-65%. No regional wall motion and amount is. Mild aortic sclerosis, no stenosis   TUBAL LIGATION  1979   Past Surgical History:  Procedure Laterality Date   65 HOUR PH STUDY N/A 01/04/2016   Procedure: 24 HOUR PH STUDY;  Surgeon: Elspeth Deward Naval, MD;  Location: [facility];  Service: Gastroenterology;  Laterality: N/A;   ABDOMINAL HYSTERECTOMY  1988   ADENOIDECTOMY     ANTERIOR CERVICAL DECOMP/DISCECTOMY FUSION  2009, 1993   first procedure in ~ 1993/Dr Botero   APPENDECTOMY  1988   ATRIAL FIBRILLATION ABLATION N/A 12/16/2021   Procedure: ATRIAL FIBRILLATION ABLATION;  Surgeon: Cindie Ole DASEN, MD;  Location: [facility];  Service: Cardiovascular;  Laterality: N/A;   ATRIAL FIBRILLATION ABLATION N/A 06/29/2023   Procedure: ATRIAL FIBRILLATION ABLATION;  Surgeon: Cindie Ole DASEN, MD;  Location: [facility];  Service: Cardiovascular;  Laterality: N/A;   BACK SURGERY     CARDIOVERSION N/A 04/03/2019   Procedure: CARDIOVERSION;  Surgeon: Maranda Leim DEL, MD;  Location: [facility];  Service: Cardiovascular;  Laterality: N/A;   CHOLECYSTECTOMY  2005   ESOPHAGEAL MANOMETRY N/A 01/04/2016   Procedure: ESOPHAGEAL MANOMETRY (EM) 24 HR PH;  Surgeon: Elspeth Deward Naval, MD;  Location: [facility];  Service: Gastroenterology;  Laterality: N/A;   ESOPHAGOGASTRODUODENOSCOPY (EGD) WITH PROPOFOL  N/A 05/17/2016   Procedure: ESOPHAGOGASTRODUODENOSCOPY (EGD) WITH PROPOFOL ;  Surgeon: Elspeth Deward Naval, MD;  Location: [facility];  Service: Gastroenterology;  Laterality: N/A;   FOOT SURGERY Left 08/2022   NASAL SINUS SURGERY  2006   SAVORY DILATION N/A 05/17/2016   Procedure: SAVORY DILATION;  Surgeon: Elspeth Deward Naval, MD;  Location: [facility];  Service: Gastroenterology;  Laterality: N/A;   TEE WITHOUT CARDIOVERSION N/A 04/03/2019   Procedure: TRANSESOPHAGEAL ECHOCARDIOGRAM (TEE)-with DCCV;  Surgeon: Maranda Leim DEL, MD;  Location: [facility] ENDOSCOPY;; EF 55 to 60%.  No LVH.  Normal RV.  Moderate LA dilation.  No thrombus.  Mild RA dilation.  Mild to moderate TR.  Mild aortic valve sclerosis, no stenosis.  Moderately elevated PA pressures (47 mmHg)   TONSILLECTOMY AND ADENOIDECTOMY     TRANSTHORACIC ECHOCARDIOGRAM  2009   Normal  LV size and function. EF 60-65%. No regional wall motion and amount is. Mild aortic sclerosis, no stenosis   TUBAL LIGATION  1979   Past Medical History:  Diagnosis Date   B12 deficiency    Depression    Diabetes mellitus type 2, diet-controlled (HCC) 07/24/2018   Fibromyalgia    Generalized hyperhidrosis 04/17/2014   GERD (gastroesophageal reflux disease)    History of rheumatic fever as a child    Hyperlipemia    Hypertension    HYPOTHYROIDISM 05/29/2007   Irritable bowel syndrome    Migraines    OSA (obstructive sleep apnea) 06/26/2019   Senile calcific aortic valve sclerosis    Aortic Sclerosis-no stenosis   BP 121/71 (BP Location: Left Arm, Patient Position: Sitting)   Pulse 66   Ht 5' 4 (1.626 m)   Wt 175 lb (79.4 kg)   SpO2 99%   BMI 30.04 kg/m   Opioid Risk Score:   Fall Risk Score:  `1  Depression screen [facility] 2/9     04/30/2024   11:52 AM 03/28/2024    1:01 PM 03/25/2024    3:26 PM 02/29/2024   11:38 AM 12/28/2023   11:42 AM 08/30/2023   11:34 AM 08/22/2023    1:03 PM  Depression screen PHQ 2/9  Decreased Interest 0 0 0 0 0 0 0  Down, Depressed, Hopeless 0 0 0 0 0 0 0  PHQ - 2 Score 0 0 0 0 0 0 0  Altered sleeping 0  0      Tired, decreased energy 0  0      Change in appetite 0  0      Feeling bad or failure about yourself  0  0      Trouble concentrating 0  0      Moving slowly or fidgety/restless 0  0      Suicidal thoughts 0  0      PHQ-9 Score 0  0      Difficult doing work/chores Very difficult  Not difficult at all          Review of Systems  Musculoskeletal:  Positive for arthralgias, back pain and myalgias.       Back pain, right shoulder pain, right elbow pain, right hip pain, left knee pain  All other systems reviewed and are negative.  Objective:   Physical Exam Vitals and nursing note reviewed.  Constitutional:      Appearance: Normal appearance.  Neck:     Comments: Cervical Paraspinal Tenderness: C-5-C-6 Cardiovascular:      Rate and Rhythm: Normal rate and regular rhythm.     Pulses: Normal pulses.     Heart sounds: Normal heart sounds.  Pulmonary:     Effort: Pulmonary effort is normal.     Breath sounds: Normal breath sounds.  Musculoskeletal:     Comments: Normal Muscle Bulk and Muscle Testing Reveals:  Upper Extremities: Full ROM and Muscle Strength 5/5 Bilateral AC Joint Tenderness Lower Extremities: Full ROM and Muscle Strength 5/5 Arises from chair with ease Narrow Based Gait     Skin:    General: Skin is warm and dry.  Neurological:     Mental Status: She is alert and oriented to person, place, and time.  Psychiatric:        Mood and Affect: Mood normal.        Behavior: Behavior normal.          Assessment & Plan:  Cervical postlaminectomy syndrome:/ Cervicalgia/ Cervical Radiculitis/ with neck and shoulder pain/ Chronic Midline Thoracic Pain.  Continue exercise routine and using heat therapy. 03/28/2024. Continue : current medication regimen. Refilled: MS Contin  15 mg one tablet three times a day #90.   We will continue the opioid monitoring program, this consists of regular clinic visits, examinations, urine drug screen, pill counts as well as use of [place]  Controlled Substance Reporting system. A 12 month History has been reviewed on the [place]  Controlled Substance Reporting System on 04/30/2024.   2. Fibromyalgia/ Neuropathy: Continue current medication regimen with Gabapentin , activity and exercise regime. 04/30/2024. 3. Left Knee Pain/ Mild Degenerative Changes S/P : on 12/06/2022 with Dr Carilyn.   LEFT Genicular nerve block x 3, Upper medial, Upper lateral , and Lower Medial under fluoroscopic guidance    On 02/09/2023: S/P LEFT KNEE Genicular nerve Radiofrequency neurotomy x 3 nerves, Upper medial, Upper lateral , and Lower Medial under fluoroscopic guidance . Continue current medication regimen with Voltaren  Gel. 04/30/2024. 4. Bilateral  Ankle Pain:  No  complaints today. Continue HEP as tolerated. Continue to monitor.04/30/2024. 5. Muscle Spasm: Continue current medication regimen with  Robaxin . Continue to Monitor. 04/30/2024 6. Opioid Induced Constipation: No complaints Continue to Monitor. 04/30/2024. 7. Midline Low Back Pain: Continue current medication regime, continue HEP as tolerated. 04/30/2024. 8. Right Gluteal Medius Weakness and Atrophy: No complaints Today:Continue with Hip Abduction Exercises as tolerated: Continue to Monitor. 04/30/2024. 9. Right Hip Pain/ Right Greater Trochanteric Tenderness: No complaints today. Denies Falling. RX: X-ray>  Continue to [employer] and Heat Therapy. Continue to Monitor. 04/30/2024  10. Sacroiliac Joint Pain:  S/P Sacroiliac Injection with good relief noted on 11/10/2022. Continue to monitor. 04/30/2024 11. Lumbar Radiculitis: No complaints today.Continue Gabapentin . Continue HEP as tolerated. Continue current medication regimen. Continue to monitor.  04/30/2024.  12. Right epicondylitis: Continue current medication regimen. Continue to monitor. 04/30/2024   F/U in 1 month

## 2024-05-15 ENCOUNTER — Other Ambulatory Visit: Payer: Self-pay | Admitting: Cardiology

## 2024-05-21 NOTE — Progress Notes (Signed)
 Subjective:    Patient ID: [name], female    DOB: 1950/08/28, 73 y.o.   MRN: [id number]  HPI: [name] is a 73 y.o. female who returns for follow up appointment for chronic pain and medication refill.She  states her pain is located in her mid- back and left knee pain.She  rates her pain 3. Her current exercise regime is walking and performing stretching exercises.  [name] Morphine  equivalent is 45.00 MME.   Last Oral Swab was Performed on 02/29/2024, it was consistent.      Pain Inventory Average Pain 3 Pain Right Now 3 My pain is constant, burning, dull, and aching  In the last 24 hours, has pain interfered with the following? General activity 3 Relation with others 3 Enjoyment of life 3 What TIME of day is your pain at its worst? evening and night Sleep (in general) Good  Pain is worse with: walking, bending, standing, and some activites Pain improves with: rest, heat/ice, pacing activities, and medication Relief from Meds: 5  Family History  Problem Relation Age of Onset   Breast cancer Mother    Diabetes Mother    Heart disease Mother    Allergies Mother    Diabetes Father    Heart disease Father    Emphysema Father    Allergies Father    Breast cancer Paternal Aunt    Colon cancer Neg Hx    Esophageal cancer Neg Hx    Rectal cancer Neg Hx    Stomach cancer Neg Hx    Social History   Socioeconomic History   Marital status: Widowed    Spouse name: Not on file   Number of children: Not on file   Years of education: Not on file   Highest education level: Some college, no degree  Occupational History   Occupation: Catering Manager   Occupation: retired  Tobacco Use   Smoking status: Never    Passive exposure: Past   Smokeless tobacco: Never   Tobacco comments:    Never smoke 01/13/22  Vaping Use   Vaping status: Never Used  Substance and Sexual Activity   Alcohol  use: Yes    Alcohol /week: 1.0 standard drink of alcohol     Types: 1 Glasses of  wine per week    Comment: rare   Drug use: Never   Sexual activity: Not Currently  Other Topics Concern   Not on file  Social History Narrative   Not on file   Social Drivers of Health   Financial Resource Strain: Low Risk  (03/25/2024)   Overall Financial Resource Strain (CARDIA)    Difficulty of Paying Living Expenses: Not very hard  Food Insecurity: No Food Insecurity (03/25/2024)   Hunger Vital Sign    Worried About Running Out of Food in the Last Year: Never true    Ran Out of Food in the Last Year: Never true  Transportation Needs: No Transportation Needs (03/25/2024)   PRAPARE - Administrator, Civil Service (Medical): No    Lack of Transportation (Non-Medical): No  Physical Activity: Insufficiently Active (03/25/2024)   Exercise Vital Sign    Days of Exercise per Week: 6 days    Minutes of Exercise per Session: 20 min  Stress: No Stress Concern Present (03/25/2024)   [name] [employer] - Occupational Stress Questionnaire    Feeling of Stress: Not at all  Social Connections: Moderately Integrated (03/25/2024)   Social Connection and Isolation Panel  Frequency of Communication with Friends and Family: Twice a week    Frequency of Social Gatherings with Friends and Family: Twice a week    Attends Religious Services: More than 4 times per year    Active Member of [employer] or Organizations: Yes    Attends Banker Meetings: More than 4 times per year    Marital Status: Widowed   Past Surgical History:  Procedure Laterality Date   22 HOUR PH STUDY N/A 01/04/2016   Procedure: 24 HOUR PH STUDY;  Surgeon: Elspeth Deward Naval, MD;  Location: [facility];  Service: Gastroenterology;  Laterality: N/A;   ABDOMINAL HYSTERECTOMY  1988   ADENOIDECTOMY     ANTERIOR CERVICAL DECOMP/DISCECTOMY FUSION  2009, 1993   first procedure in ~ 1993/Dr Botero   APPENDECTOMY  1988   ATRIAL FIBRILLATION ABLATION N/A 12/16/2021   Procedure: ATRIAL  FIBRILLATION ABLATION;  Surgeon: Cindie Ole DASEN, MD;  Location: [facility];  Service: Cardiovascular;  Laterality: N/A;   ATRIAL FIBRILLATION ABLATION N/A 06/29/2023   Procedure: ATRIAL FIBRILLATION ABLATION;  Surgeon: Cindie Ole DASEN, MD;  Location: [facility];  Service: Cardiovascular;  Laterality: N/A;   BACK SURGERY     CARDIOVERSION N/A 04/03/2019   Procedure: CARDIOVERSION;  Surgeon: Maranda Leim DEL, MD;  Location: [facility];  Service: Cardiovascular;  Laterality: N/A;   CHOLECYSTECTOMY  2005   ESOPHAGEAL MANOMETRY N/A 01/04/2016   Procedure: ESOPHAGEAL MANOMETRY (EM) 24 HR PH;  Surgeon: Elspeth Deward Naval, MD;  Location: [facility];  Service: Gastroenterology;  Laterality: N/A;   ESOPHAGOGASTRODUODENOSCOPY (EGD) WITH PROPOFOL  N/A 05/17/2016   Procedure: ESOPHAGOGASTRODUODENOSCOPY (EGD) WITH PROPOFOL ;  Surgeon: Elspeth Deward Naval, MD;  Location: [facility];  Service: Gastroenterology;  Laterality: N/A;   FOOT SURGERY Left 08/2022   NASAL SINUS SURGERY  2006   SAVORY DILATION N/A 05/17/2016   Procedure: SAVORY DILATION;  Surgeon: Elspeth Deward Naval, MD;  Location: [facility];  Service: Gastroenterology;  Laterality: N/A;   TEE WITHOUT CARDIOVERSION N/A 04/03/2019   Procedure: TRANSESOPHAGEAL ECHOCARDIOGRAM (TEE)-with DCCV;  Surgeon: Maranda Leim DEL, MD;  Location: [facility]COPY;; EF 55 to 60%.  No LVH.  Normal RV.  Moderate LA dilation.  No thrombus.  Mild RA dilation.  Mild to moderate TR.  Mild aortic valve sclerosis, no stenosis.  Moderately elevated PA pressures (47 mmHg)   TONSILLECTOMY AND ADENOIDECTOMY     TRANSTHORACIC ECHOCARDIOGRAM  2009   Normal LV size and function. EF 60-65%. No regional wall motion and amount is. Mild aortic sclerosis, no stenosis   TUBAL LIGATION  1979   Past Surgical History:  Procedure Laterality Date   61 HOUR PH STUDY N/A 01/04/2016   Procedure: 24 HOUR PH STUDY;  Surgeon: Elspeth Deward Naval, MD;   Location: [facility];  Service: Gastroenterology;  Laterality: N/A;   ABDOMINAL HYSTERECTOMY  1988   ADENOIDECTOMY     ANTERIOR CERVICAL DECOMP/DISCECTOMY FUSION  2009, 1993   first procedure in ~ 1993/Dr Botero   APPENDECTOMY  1988   ATRIAL FIBRILLATION ABLATION N/A 12/16/2021   Procedure: ATRIAL FIBRILLATION ABLATION;  Surgeon: Cindie Ole DASEN, MD;  Location: [facility];  Service: Cardiovascular;  Laterality: N/A;   ATRIAL FIBRILLATION ABLATION N/A 06/29/2023   Procedure: ATRIAL FIBRILLATION ABLATION;  Surgeon: Cindie Ole DASEN, MD;  Location: [facility];  Service: Cardiovascular;  Laterality: N/A;   BACK SURGERY     CARDIOVERSION N/A 04/03/2019   Procedure: CARDIOVERSION;  Surgeon: Maranda Leim DEL,  MD;  Location: [facility];  Service: Cardiovascular;  Laterality: N/A;   CHOLECYSTECTOMY  2005   ESOPHAGEAL MANOMETRY N/A 01/04/2016   Procedure: ESOPHAGEAL MANOMETRY (EM) 24 HR PH;  Surgeon: Elspeth Deward Naval, MD;  Location: [facility];  Service: Gastroenterology;  Laterality: N/A;   ESOPHAGOGASTRODUODENOSCOPY (EGD) WITH PROPOFOL  N/A 05/17/2016   Procedure: ESOPHAGOGASTRODUODENOSCOPY (EGD) WITH PROPOFOL ;  Surgeon: Elspeth Deward Naval, MD;  Location: [facility];  Service: Gastroenterology;  Laterality: N/A;   FOOT SURGERY Left 08/2022   NASAL SINUS SURGERY  2006   SAVORY DILATION N/A 05/17/2016   Procedure: SAVORY DILATION;  Surgeon: Elspeth Deward Naval, MD;  Location: [facility];  Service: Gastroenterology;  Laterality: N/A;   TEE WITHOUT CARDIOVERSION N/A 04/03/2019   Procedure: TRANSESOPHAGEAL ECHOCARDIOGRAM (TEE)-with DCCV;  Surgeon: Maranda Leim DEL, MD;  Location: [facility] END[facility]COPY;; EF 55 to 60%.  No LVH.  Normal RV.  Moderate LA dilation.  No thrombus.  Mild RA dilation.  Mild to moderate TR.  Mild aortic valve sclerosis, no stenosis.  Moderately elevated PA pressures (47 mmHg)   TONSILLECTOMY AND ADENOIDECTOMY     TRANSTHORACIC ECHOCARDIOGRAM   2009   Normal LV size and function. EF 60-65%. No regional wall motion and amount is. Mild aortic sclerosis, no stenosis   TUBAL LIGATION  1979   Past Medical History:  Diagnosis Date   B12 deficiency    Depression    Diabetes mellitus type 2, diet-controlled (HCC) 07/24/2018   Fibromyalgia    Generalized hyperhidrosis 04/17/2014   GERD (gastroesophageal reflux disease)    History of rheumatic fever as a child    Hyperlipemia    Hypertension    HYPOTHYROIDISM 05/29/2007   Irritable bowel syndrome    Migraines    OSA (obstructive sleep apnea) 06/26/2019   Senile calcific aortic valve sclerosis    Aortic Sclerosis-no stenosis   There were no vitals taken for this visit.  Opioid Risk Score:   Fall Risk Score:  `1  Depression screen [facility] 2/9     04/30/2024   11:52 AM 03/28/2024    1:01 PM 03/25/2024    3:26 PM 02/29/2024   11:38 AM 12/28/2023   11:42 AM 08/30/2023   11:34 AM 08/22/2023    1:03 PM  Depression screen PHQ 2/9  Decreased Interest 0 0 0 0 0 0 0  Down, Depressed, Hopeless 0 0 0 0 0 0 0  PHQ - 2 Score 0 0 0 0 0 0 0  Altered sleeping 0  0      Tired, decreased energy 0  0      Change in appetite 0  0      Feeling bad or failure about yourself  0  0      Trouble concentrating 0  0      Moving slowly or fidgety/restless 0  0      Suicidal thoughts 0  0      PHQ-9 Score 0   0       Difficult doing work/chores Very difficult  Not difficult at all         Data saved with a previous flowsheet row definition    Review of Systems  Musculoskeletal:  Positive for back pain and neck pain.       Pian in both knees, upper right leg up to hip, right arm and right fingers  All other systems reviewed and are negative.      Objective:   Physical Exam Vitals and nursing note  reviewed.  Constitutional:      Appearance: Normal appearance.  Cardiovascular:     Rate and Rhythm: Normal rate and regular rhythm.     Pulses: Normal pulses.     Heart sounds: Normal heart sounds.   Pulmonary:     Effort: Pulmonary effort is normal.     Breath sounds: Normal breath sounds.  Musculoskeletal:     Comments: Normal Muscle Bulk and Muscle Testing Reveals:  Upper Extremities: Full ROM and Muscle Strength 5/5  Thoracic Paraspinal Tenderness: T-4-T-6 Lower Extremities: Full ROM and Muscle Strength 5/5 Arises from Chair with ease Narrow Based  Gait     Skin:    General: Skin is warm and dry.  Neurological:     Mental Status: She is alert and oriented to person, place, and time.  Psychiatric:        Mood and Affect: Mood normal.        Behavior: Behavior normal.          Assessment & Plan:  Cervical postlaminectomy syndrome:/ Cervicalgia/ Cervical Radiculitis/ with neck and shoulder pain/ Chronic Midline Thoracic Pain.  Continue exercise routine and using heat therapy. 05/28/2024. Continue : current medication regimen. Refilled: MS Contin  15 mg one tablet three times a day #90.   We will continue the opioid monitoring program, this consists of regular clinic visits, examinations, urine drug screen, pill counts as well as use of [place]  Controlled Substance Reporting system. A 12 month History has been reviewed on the [place]  Controlled Substance Reporting System on 05/28/2024.   2. Fibromyalgia/ Neuropathy: Continue current medication regimen with Gabapentin , activity and exercise regime. 05/28/2024. 3. Left Knee Pain/ Mild Degenerative Changes S/P : on 12/06/2022 with Dr Carilyn.   LEFT Genicular nerve block x 3, Upper medial, Upper lateral , and Lower Medial under fluoroscopic guidance    On 02/09/2023: S/P LEFT KNEE Genicular nerve Radiofrequency neurotomy x 3 nerves, Upper medial, Upper lateral , and Lower Medial under fluoroscopic guidance . Continue current medication regimen with Voltaren  Gel. 05/28/2024. 4. Bilateral  Ankle Pain:  No complaints today. Continue HEP as tolerated. Continue to monitor.05/28/2024. 5. Muscle Spasm: Continue current  medication regimen with  Robaxin . Continue to Monitor. 05/28/2024 6. Opioid Induced Constipation: No complaints Continue to Monitor. 05/28/2024. 7. Midline Low Back Pain: Continue current medication regime, continue HEP as tolerated. 05/28/2024. 8. Right Gluteal Medius Weakness and Atrophy: No complaints Today:Continue with Hip Abduction Exercises as tolerated: Continue to Monitor. 05/28/2024. 9. Right Hip Pain/ Right Greater Trochanteric Tenderness: No complaints today. Continue to Alternate Ice and Heat Therapy. Continue to Monitor. 05/28/2024  10. Sacroiliac Joint Pain:  S/P Sacroiliac Injection with good relief noted on 11/10/2022. Continue to monitor. 05/28/2024 11. Lumbar Radiculitis: No complaints today.Continue Gabapentin . Continue HEP as tolerated. Continue current medication regimen. Continue to monitor.  05/28/2024.  12. Right epicondylitis: Continue current medication regimen. Continue to monitor. 05/28/2024  F/U in 1 month

## 2024-05-28 ENCOUNTER — Encounter: Attending: Physical Medicine & Rehabilitation | Admitting: Registered Nurse

## 2024-05-28 ENCOUNTER — Encounter: Payer: Self-pay | Admitting: Registered Nurse

## 2024-05-28 VITALS — BP 122/75 | HR 70 | Ht 64.0 in | Wt 173.0 lb

## 2024-05-28 DIAGNOSIS — M25551 Pain in right hip: Secondary | ICD-10-CM | POA: Insufficient documentation

## 2024-05-28 DIAGNOSIS — Z5181 Encounter for therapeutic drug level monitoring: Secondary | ICD-10-CM | POA: Insufficient documentation

## 2024-05-28 DIAGNOSIS — M1712 Unilateral primary osteoarthritis, left knee: Secondary | ICD-10-CM | POA: Insufficient documentation

## 2024-05-28 DIAGNOSIS — Z79891 Long term (current) use of opiate analgesic: Secondary | ICD-10-CM | POA: Diagnosis present

## 2024-05-28 DIAGNOSIS — G8929 Other chronic pain: Secondary | ICD-10-CM | POA: Diagnosis present

## 2024-05-28 DIAGNOSIS — M546 Pain in thoracic spine: Secondary | ICD-10-CM | POA: Diagnosis present

## 2024-05-28 DIAGNOSIS — G894 Chronic pain syndrome: Secondary | ICD-10-CM | POA: Insufficient documentation

## 2024-05-28 MED ORDER — MORPHINE SULFATE ER 15 MG PO TBCR: 15.0000 mg | EXTENDED_RELEASE_TABLET | Freq: Three times a day (TID) | ORAL | 0 refills | Status: DC

## 2024-06-03 ENCOUNTER — Other Ambulatory Visit: Payer: Self-pay | Admitting: Cardiology

## 2024-06-03 DIAGNOSIS — I4819 Other persistent atrial fibrillation: Secondary | ICD-10-CM

## 2024-06-04 NOTE — Telephone Encounter (Signed)
 Prescription refill request for Eliquis  received. Indication:afib Last office visit:7/25 Scr: 0.66  6/25 Age:73 Weight:78.5  kg  Prescription refilled

## 2024-06-26 NOTE — Progress Notes (Unsigned)
 "  Subjective:    Patient ID: [name], female    DOB: 08/09/1950, 73 y.o.   MRN: [id number]  HPI: [name] is a 73 y.o. female who returns for follow up appointment for chronic pain and medication refill. She states her pain is located in her mid-back and right hip pain . She rates her pain 3. Her current exercise regime is walking and performing stretching exercises.  Ms. Vinje Morphine  equivalent is 45.00 MME.   Oral Swab was Performed today.    Pain Inventory Average Pain 4 Pain Right Now 3 My pain is constant, burning, dull, and aching  In the last 24 hours, has pain interfered with the following? General activity 3 Relation with others 3 Enjoyment of life 3 What TIME of day is your pain at its worst? evening and night Sleep (in general) Good  Pain is worse with: walking, bending, standing, and some activites Pain improves with: rest, heat/ice, and medication Relief from Meds: 6  Family History  Problem Relation Age of Onset   Breast cancer Mother    Diabetes Mother    Heart disease Mother    Allergies Mother    Diabetes Father    Heart disease Father    Emphysema Father    Allergies Father    Breast cancer Paternal Aunt    Colon cancer Neg Hx    Esophageal cancer Neg Hx    Rectal cancer Neg Hx    Stomach cancer Neg Hx    Social History   Socioeconomic History   Marital status: Widowed    Spouse name: Not on file   Number of children: Not on file   Years of education: Not on file   Highest education level: Some college, no degree  Occupational History   Occupation: Catering Manager   Occupation: retired  Tobacco Use   Smoking status: Never    Passive exposure: Past   Smokeless tobacco: Never   Tobacco comments:    Never smoke 01/13/22  Vaping Use   Vaping status: Never Used  Substance and Sexual Activity   Alcohol  use: Yes    Alcohol /week: 1.0 standard drink of alcohol     Types: 1 Glasses of wine per week    Comment: rare   Drug use: Never    Sexual activity: Not Currently  Other Topics Concern   Not on file  Social History Narrative   Not on file   Social Drivers of Health   Tobacco Use: Low Risk (05/28/2024)   Patient History    Smoking Tobacco Use: Never    Smokeless Tobacco Use: Never    Passive Exposure: Past  Financial Resource Strain: Low Risk (03/25/2024)   Overall Financial Resource Strain (CARDIA)    Difficulty of Paying Living Expenses: Not very hard  Food Insecurity: No Food Insecurity (03/25/2024)   Epic    Worried About Programme Researcher, Broadcasting/film/video in the Last Year: Never true    Ran Out of Food in the Last Year: Never true  Transportation Needs: No Transportation Needs (03/25/2024)   Epic    Lack of Transportation (Medical): No    Lack of Transportation (Non-Medical): No  Physical Activity: Insufficiently Active (03/25/2024)   Exercise Vital Sign    Days of Exercise per Week: 6 days    Minutes of Exercise per Session: 20 min  Stress: No Stress Concern Present (03/25/2024)   [employer] of Occupational Health - Occupational Stress Questionnaire    Feeling of Stress: Not at  all  Social Connections: Moderately Integrated (03/25/2024)   Social Connection and Isolation Panel    Frequency of Communication with Friends and Family: Twice a week    Frequency of Social Gatherings with Friends and Family: Twice a week    Attends Religious Services: More than 4 times per year    Active Member of [employer] or Organizations: Yes    Attends Banker Meetings: More than 4 times per year    Marital Status: Widowed  Depression (PHQ2-9): Low Risk (04/30/2024)   Depression (PHQ2-9)    PHQ-2 Score: 0  Alcohol  Screen: Low Risk (03/25/2024)   Alcohol  Screen    Last Alcohol  Screening Score (AUDIT): 1  Housing: Low Risk (03/25/2024)   Epic    Unable to Pay for Housing in the Last Year: No    Number of Times Moved in the Last Year: 0    Homeless in the Last Year: No  Utilities: Not At Risk (03/25/2024)   Epic     Threatened with loss of utilities: No  Health Literacy: Adequate Health Literacy (03/25/2024)   B1300 Health Literacy    Frequency of need for help with medical instructions: Never   Past Surgical History:  Procedure Laterality Date   64 HOUR PH STUDY N/A 01/04/2016   Procedure: 24 HOUR PH STUDY;  Surgeon: Elspeth Deward Naval, MD;  Location: [facility];  Service: Gastroenterology;  Laterality: N/A;   ABDOMINAL HYSTERECTOMY  1988   ADENOIDECTOMY     ANTERIOR CERVICAL DECOMP/DISCECTOMY FUSION  2009, 1993   first procedure in ~ 1993/Dr Botero   APPENDECTOMY  1988   ATRIAL FIBRILLATION ABLATION N/A 12/16/2021   Procedure: ATRIAL FIBRILLATION ABLATION;  Surgeon: Cindie Ole DASEN, MD;  Location: [facility];  Service: Cardiovascular;  Laterality: N/A;   ATRIAL FIBRILLATION ABLATION N/A 06/29/2023   Procedure: ATRIAL FIBRILLATION ABLATION;  Surgeon: Cindie Ole DASEN, MD;  Location: [facility];  Service: Cardiovascular;  Laterality: N/A;   BACK SURGERY     CARDIOVERSION N/A 04/03/2019   Procedure: CARDIOVERSION;  Surgeon: Maranda Leim DEL, MD;  Location: [facility];  Service: Cardiovascular;  Laterality: N/A;   CHOLECYSTECTOMY  2005   ESOPHAGEAL MANOMETRY N/A 01/04/2016   Procedure: ESOPHAGEAL MANOMETRY (EM) 24 HR PH;  Surgeon: Elspeth Deward Naval, MD;  Location: [facility];  Service: Gastroenterology;  Laterality: N/A;   ESOPHAGOGASTRODUODENOSCOPY (EGD) WITH PROPOFOL  N/A 05/17/2016   Procedure: ESOPHAGOGASTRODUODENOSCOPY (EGD) WITH PROPOFOL ;  Surgeon: Elspeth Deward Naval, MD;  Location: [facility];  Service: Gastroenterology;  Laterality: N/A;   FOOT SURGERY Left 08/2022   NASAL SINUS SURGERY  2006   SAVORY DILATION N/A 05/17/2016   Procedure: SAVORY DILATION;  Surgeon: Elspeth Deward Naval, MD;  Location: [facility];  Service: Gastroenterology;  Laterality: N/A;   TEE WITHOUT CARDIOVERSION N/A 04/03/2019   Procedure: TRANSESOPHAGEAL ECHOCARDIOGRAM  (TEE)-with DCCV;  Surgeon: Maranda Leim DEL, MD;  Location: [facility];; EF 55 to 60%.  No LVH.  Normal RV.  Moderate LA dilation.  No thrombus.  Mild RA dilation.  Mild to moderate TR.  Mild aortic valve sclerosis, no stenosis.  Moderately elevated PA pressures (47 mmHg)   TONSILLECTOMY AND ADENOIDECTOMY     TRANSTHORACIC ECHOCARDIOGRAM  2009   Normal LV size and function. EF 60-65%. No regional wall motion and amount is. Mild aortic sclerosis, no stenosis   TUBAL LIGATION  1979   Past Surgical History:  Procedure Laterality Date   57 HOUR PH STUDY N/A 01/04/2016  Procedure: 24 HOUR PH STUDY;  Surgeon: Elspeth Deward Naval, MD;  Location: [facility];  Service: Gastroenterology;  Laterality: N/A;   ABDOMINAL HYSTERECTOMY  1988   ADENOIDECTOMY     ANTERIOR CERVICAL DECOMP/DISCECTOMY FUSION  2009, 1993   first procedure in ~ 1993/Dr Botero   APPENDECTOMY  1988   ATRIAL FIBRILLATION ABLATION N/A 12/16/2021   Procedure: ATRIAL FIBRILLATION ABLATION;  Surgeon: Cindie Ole DASEN, MD;  Location: [facility];  Service: Cardiovascular;  Laterality: N/A;   ATRIAL FIBRILLATION ABLATION N/A 06/29/2023   Procedure: ATRIAL FIBRILLATION ABLATION;  Surgeon: Cindie Ole DASEN, MD;  Location: [facility];  Service: Cardiovascular;  Laterality: N/A;   BACK SURGERY     CARDIOVERSION N/A 04/03/2019   Procedure: CARDIOVERSION;  Surgeon: Maranda Leim DEL, MD;  Location: [facility];  Service: Cardiovascular;  Laterality: N/A;   CHOLECYSTECTOMY  2005   ESOPHAGEAL MANOMETRY N/A 01/04/2016   Procedure: ESOPHAGEAL MANOMETRY (EM) 24 HR PH;  Surgeon: Elspeth Deward Naval, MD;  Location: [facility];  Service: Gastroenterology;  Laterality: N/A;   ESOPHAGOGASTRODUODENOSCOPY (EGD) WITH PROPOFOL  N/A 05/17/2016   Procedure: ESOPHAGOGASTRODUODENOSCOPY (EGD) WITH PROPOFOL ;  Surgeon: Elspeth Deward Naval, MD;  Location: [facility];  Service: Gastroenterology;  Laterality: N/A;   FOOT SURGERY  Left 08/2022   NASAL SINUS SURGERY  2006   SAVORY DILATION N/A 05/17/2016   Procedure: SAVORY DILATION;  Surgeon: Elspeth Deward Naval, MD;  Location: [facility];  Service: Gastroenterology;  Laterality: N/A;   TEE WITHOUT CARDIOVERSION N/A 04/03/2019   Procedure: TRANSESOPHAGEAL ECHOCARDIOGRAM (TEE)-with DCCV;  Surgeon: Maranda Leim DEL, MD;  Location: [facility] ENDOSCOPY;; EF 55 to 60%.  No LVH.  Normal RV.  Moderate LA dilation.  No thrombus.  Mild RA dilation.  Mild to moderate TR.  Mild aortic valve sclerosis, no stenosis.  Moderately elevated PA pressures (47 mmHg)   TONSILLECTOMY AND ADENOIDECTOMY     TRANSTHORACIC ECHOCARDIOGRAM  2009   Normal LV size and function. EF 60-65%. No regional wall motion and amount is. Mild aortic sclerosis, no stenosis   TUBAL LIGATION  1979   Past Medical History:  Diagnosis Date   B12 deficiency    Depression    Diabetes mellitus type 2, diet-controlled (HCC) 07/24/2018   Fibromyalgia    Generalized hyperhidrosis 04/17/2014   GERD (gastroesophageal reflux disease)    History of rheumatic fever as a child    Hyperlipemia    Hypertension    HYPOTHYROIDISM 05/29/2007   Irritable bowel syndrome    Migraines    OSA (obstructive sleep apnea) 06/26/2019   Senile calcific aortic valve sclerosis    Aortic Sclerosis-no stenosis   There were no vitals taken for this visit.  Opioid Risk Score:   Fall Risk Score:  `1  Depression screen [facility] 2/9     04/30/2024   11:52 AM 03/28/2024    1:01 PM 03/25/2024    3:26 PM 02/29/2024   11:38 AM 12/28/2023   11:42 AM 08/30/2023   11:34 AM 08/22/2023    1:03 PM  Depression screen PHQ 2/9  Decreased Interest 0 0 0 0 0 0 0  Down, Depressed, Hopeless 0 0 0 0 0 0 0  PHQ - 2 Score 0 0 0 0 0 0 0  Altered sleeping 0  0      Tired, decreased energy 0  0      Change in appetite 0  0      Feeling bad or failure about yourself  0  0      Trouble concentrating 0  0      Moving slowly or fidgety/restless 0  0       Suicidal thoughts 0  0      PHQ-9 Score 0   0       Difficult doing work/chores Very difficult  Not difficult at all         Data saved with a previous flowsheet row definition    Review of Systems  Musculoskeletal:  Positive for gait problem and neck pain.       Right hip pain, pain in the left knee  All other systems reviewed and are negative.      Objective:   Physical Exam Vitals and nursing note reviewed.  Constitutional:      Appearance: Normal appearance.  Cardiovascular:     Rate and Rhythm: Normal rate and regular rhythm.     Pulses: Normal pulses.     Heart sounds: Normal heart sounds.  Pulmonary:     Effort: Pulmonary effort is normal.     Breath sounds: Normal breath sounds.  Musculoskeletal:     Comments: Normal Muscle Bulk and Muscle Testing Reveals:  Upper Extremities: Full ROM and Muscle Strength 5/5  Thoracic Paraspinal Tenderness: T-4-T-6  Lower Extremities: Full ROM and Muscle Strength 5/5 Arises from Table with ease Narrow Based  Gait     Skin:    General: Skin is warm and dry.  Neurological:     Mental Status: She is alert and oriented to person, place, and time.  Psychiatric:        Mood and Affect: Mood normal.        Behavior: Behavior normal.          Assessment & Plan:  Cervical postlaminectomy syndrome:/ Cervicalgia/ Cervical Radiculitis/ with neck and shoulder pain/ Chronic Midline Thoracic Pain.  Continue exercise routine and using heat therapy. 06/28/2024. Continue : current medication regimen. Refilled: MS Contin  15 mg one tablet three times a day #90.   We will continue the opioid monitoring program, this consists of regular clinic visits, examinations, urine drug screen, pill counts as well as use of [place]  Controlled Substance Reporting system. A 12 month History has been reviewed on the   Controlled Substance Reporting System on 06/28/2024.   2. Fibromyalgia/ Neuropathy: Continue current medication regimen  with Gabapentin , activity and exercise regime. 06/28/2024. 3. Left Knee Pain/ Mild Degenerative Changes S/P : on 12/06/2022 with Dr Carilyn.   LEFT Genicular nerve block x 3, Upper medial, Upper lateral , and Lower Medial under fluoroscopic guidance    On 02/09/2023: S/P LEFT KNEE Genicular nerve Radiofrequency neurotomy x 3 nerves, Upper medial, Upper lateral , and Lower Medial under fluoroscopic guidance . Continue current medication regimen with Voltaren  Gel. 06/28/2024. 4. Bilateral  Ankle Pain:  No complaints today. Continue HEP as tolerated. Continue to monitor.06/28/2024. 5. Muscle Spasm: Continue current medication regimen with  Robaxin . Continue to Monitor. 06/28/2024 6. Opioid Induced Constipation: No complaints Continue to Monitor. 06/28/2024. 7. Midline Low Back Pain: Continue current medication regime, continue HEP as tolerated. 06/28/2024. 8. Right Gluteal Medius Weakness and Atrophy: No complaints Today:Continue with Hip Abduction Exercises as tolerated: Continue to Monitor. 06/28/2024. 9. Right Hip Pain/ Right Greater Trochanteric Tenderness: No complaints today. Continue to Alternate Ice and Heat Therapy. Continue to Monitor. 06/28/2024  10. Sacroiliac Joint Pain:  S/P Sacroiliac Injection with good relief noted on 11/10/2022. Continue to monitor. 06/28/2024 11. Lumbar Radiculitis: No complaints  today.Continue Gabapentin . Continue HEP as tolerated. Continue current medication regimen. Continue to monitor.  06/28/2024.  12. Right epicondylitis: No complaints today. Continue current medication regimen. Continue to monitor. 06/28/2024   F/U in 1 month                 "

## 2024-06-28 ENCOUNTER — Encounter: Payer: Self-pay | Admitting: Registered Nurse

## 2024-06-28 ENCOUNTER — Encounter: Attending: Physical Medicine & Rehabilitation | Admitting: Registered Nurse

## 2024-06-28 VITALS — BP 109/61 | HR 62 | Ht 64.0 in | Wt 172.0 lb

## 2024-06-28 DIAGNOSIS — G894 Chronic pain syndrome: Secondary | ICD-10-CM | POA: Diagnosis not present

## 2024-06-28 DIAGNOSIS — Z79891 Long term (current) use of opiate analgesic: Secondary | ICD-10-CM | POA: Diagnosis not present

## 2024-06-28 DIAGNOSIS — M25551 Pain in right hip: Secondary | ICD-10-CM | POA: Diagnosis not present

## 2024-06-28 DIAGNOSIS — M546 Pain in thoracic spine: Secondary | ICD-10-CM | POA: Insufficient documentation

## 2024-06-28 DIAGNOSIS — Z5181 Encounter for therapeutic drug level monitoring: Secondary | ICD-10-CM | POA: Insufficient documentation

## 2024-06-28 DIAGNOSIS — M542 Cervicalgia: Secondary | ICD-10-CM | POA: Diagnosis present

## 2024-06-28 DIAGNOSIS — M170 Bilateral primary osteoarthritis of knee: Secondary | ICD-10-CM | POA: Diagnosis present

## 2024-06-28 DIAGNOSIS — G8929 Other chronic pain: Secondary | ICD-10-CM | POA: Diagnosis present

## 2024-06-28 MED ORDER — MORPHINE SULFATE ER 15 MG PO TBCR: 15.0000 mg | EXTENDED_RELEASE_TABLET | Freq: Three times a day (TID) | ORAL | 0 refills | Status: DC

## 2024-07-01 LAB — DRUG TOX MONITOR 1 W/CONF, ORAL FLD
Amphetamines: NEGATIVE ng/mL
Barbiturates: NEGATIVE ng/mL
Benzodiazepines: NEGATIVE ng/mL
Buprenorphine: NEGATIVE ng/mL
Cocaine: NEGATIVE ng/mL
Codeine: NEGATIVE ng/mL
Dihydrocodeine: NEGATIVE ng/mL
Fentanyl: NEGATIVE ng/mL
Heroin Metabolite: NEGATIVE ng/mL
Hydrocodone: NEGATIVE ng/mL
Hydromorphone: NEGATIVE ng/mL
MARIJUANA: NEGATIVE ng/mL
MDMA: NEGATIVE ng/mL
Meprobamate: NEGATIVE ng/mL
Methadone: NEGATIVE ng/mL
Morphine: 6 ng/mL — ABNORMAL HIGH
Nicotine Metabolite: NEGATIVE ng/mL
Norhydrocodone: NEGATIVE ng/mL
Noroxycodone: NEGATIVE ng/mL
Opiates: POSITIVE ng/mL — AB
Oxycodone: NEGATIVE ng/mL
Oxymorphone: NEGATIVE ng/mL
Phencyclidine: NEGATIVE ng/mL
Tapentadol: NEGATIVE ng/mL
Tramadol: NEGATIVE ng/mL
Zolpidem: NEGATIVE ng/mL

## 2024-07-01 LAB — DRUG TOX ALC METAB W/CON, ORAL FLD: Alcohol Metabolite: NEGATIVE ng/mL

## 2024-07-26 ENCOUNTER — Encounter: Admitting: Registered Nurse

## 2024-07-26 ENCOUNTER — Encounter: Payer: Self-pay | Admitting: Registered Nurse

## 2024-07-26 VITALS — BP 128/69 | HR 62 | Ht 64.0 in | Wt 174.6 lb

## 2024-07-26 DIAGNOSIS — M542 Cervicalgia: Secondary | ICD-10-CM

## 2024-07-26 DIAGNOSIS — G894 Chronic pain syndrome: Secondary | ICD-10-CM | POA: Diagnosis not present

## 2024-07-26 DIAGNOSIS — M170 Bilateral primary osteoarthritis of knee: Secondary | ICD-10-CM | POA: Diagnosis not present

## 2024-07-26 DIAGNOSIS — M546 Pain in thoracic spine: Secondary | ICD-10-CM

## 2024-07-26 DIAGNOSIS — G8929 Other chronic pain: Secondary | ICD-10-CM

## 2024-07-26 DIAGNOSIS — Z5181 Encounter for therapeutic drug level monitoring: Secondary | ICD-10-CM | POA: Diagnosis not present

## 2024-07-26 DIAGNOSIS — Z79891 Long term (current) use of opiate analgesic: Secondary | ICD-10-CM

## 2024-07-26 MED ORDER — MORPHINE SULFATE ER 15 MG PO TBCR: 15.0000 mg | EXTENDED_RELEASE_TABLET | Freq: Three times a day (TID) | ORAL | 0 refills | Status: AC

## 2024-08-23 ENCOUNTER — Encounter: Admitting: Registered Nurse

## 2024-09-20 ENCOUNTER — Encounter: Attending: Physical Medicine & Rehabilitation | Admitting: Registered Nurse

## 2024-10-07 ENCOUNTER — Ambulatory Visit: Admitting: Cardiology
